# Patient Record
Sex: Female | Born: 1950
Health system: Southern US, Community
[De-identification: ages and names within clinical notes are randomized; demographics above are authoritative.]

## PROBLEM LIST (undated history)

## (undated) DIAGNOSIS — K760 Fatty (change of) liver, not elsewhere classified: Secondary | ICD-10-CM

## (undated) DIAGNOSIS — G4730 Sleep apnea, unspecified: Secondary | ICD-10-CM

## (undated) DIAGNOSIS — H269 Unspecified cataract: Secondary | ICD-10-CM

## (undated) DIAGNOSIS — F32A Depression, unspecified: Secondary | ICD-10-CM

## (undated) DIAGNOSIS — C801 Malignant (primary) neoplasm, unspecified: Secondary | ICD-10-CM

## (undated) DIAGNOSIS — S82899A Other fracture of unspecified lower leg, initial encounter for closed fracture: Secondary | ICD-10-CM

## (undated) DIAGNOSIS — I4891 Unspecified atrial fibrillation: Secondary | ICD-10-CM

## (undated) DIAGNOSIS — F99 Mental disorder, not otherwise specified: Secondary | ICD-10-CM

## (undated) DIAGNOSIS — R112 Nausea with vomiting, unspecified: Secondary | ICD-10-CM

## (undated) DIAGNOSIS — R0600 Dyspnea, unspecified: Secondary | ICD-10-CM

## (undated) DIAGNOSIS — Z9889 Other specified postprocedural states: Secondary | ICD-10-CM

## (undated) DIAGNOSIS — K589 Irritable bowel syndrome without diarrhea: Secondary | ICD-10-CM

## (undated) DIAGNOSIS — Z973 Presence of spectacles and contact lenses: Secondary | ICD-10-CM

## (undated) DIAGNOSIS — M797 Fibromyalgia: Secondary | ICD-10-CM

## (undated) DIAGNOSIS — F419 Anxiety disorder, unspecified: Secondary | ICD-10-CM

## (undated) DIAGNOSIS — G2401 Drug induced subacute dyskinesia: Secondary | ICD-10-CM

## (undated) DIAGNOSIS — F329 Major depressive disorder, single episode, unspecified: Secondary | ICD-10-CM

## (undated) DIAGNOSIS — F319 Bipolar disorder, unspecified: Secondary | ICD-10-CM

## (undated) DIAGNOSIS — K219 Gastro-esophageal reflux disease without esophagitis: Secondary | ICD-10-CM

## (undated) HISTORY — PX: ABDOMINAL HYSTERECTOMY: SHX81

## (undated) HISTORY — DX: Irritable bowel syndrome, unspecified: K58.9

## (undated) HISTORY — PX: TONSILLECTOMY: SUR1361

## (undated) HISTORY — PX: COLONOSCOPY: SHX174

## (undated) HISTORY — DX: Unspecified atrial fibrillation: I48.91

## (undated) HISTORY — DX: Fibromyalgia: M79.7

## (undated) HISTORY — DX: Fatty (change of) liver, not elsewhere classified: K76.0

## (undated) NOTE — *Deleted (*Deleted)
[facility]  [phone] [phone]     ID: Doris Smith DOB: 1950-07-17  MR#: [id number]  NFA#:[id number]  Patient Care Team: [name], FNP as PCP - General (Nurse Practitioner) Jodelle Gross, NP as PCP - Cardiology (Nurse Practitioner) Ovidio Kin, MD as Consulting Physician (General Surgery) Magrinat, Valentino Hue, MD as Consulting Physician (Oncology) Dorothy Puffer, MD as Consulting Physician (Radiation Oncology) Lysle Pearl, MD as Consulting Physician (Pulmonary Disease) Beverely Low, MD as Consulting Physician (Orthopedic Surgery) Jene Every, MD as Consulting Physician (Orthopedic Surgery) Oneta Rack, NP as Nurse Practitioner (Adult Health Nurse Practitioner) Pershing Proud, RN as Oncology Nurse Navigator Donnelly Angelica, RN as Oncology Nurse Navigator Glenna Fellows, MD as Consulting Physician (Plastic Surgery) Rollene Rotunda, MD as Consulting Physician (Cardiology) Kari Baars OTHER MD: Donnie Aho, psychiatry   CHIEF COMPLAINT: Estrogen receptor positive breast cancer (s/p right mastectomy)  CURRENT TREATMENT: anastrozole; ibandronate   INTERVAL HISTORY: Doris Smith returns today for follow-up of her estrogen receptor positive breast cancer.   She continues on anastrozole.  She does not have symptoms specifically to this medication although she generally does not feel well.  Hot flashes and vaginal dryness are not major concerns  She also continues on ibandronate.  She is using the appropriate precautions taking the medication, has had no reflux or other side effects from it  Doris Smith's last bone density screening on 10/13/2016, showed a T-score of -2.1, which is considered osteopenic.  Since her last visit here, she has not undergone any additional studies. She is behind on her annual mammography (uni left), which was due in 08/2019 from [facility].    REVIEW OF SYSTEMS: Doris Smith     COVID 19 VACCINATION STATUS:    HISTORY OF CURRENT ILLNESS: From the original intake note:  "Doris Smith" presented to her PCP, Dr. Daphine Deutscher, with right breast pain, fullness, and nipple inversion since November 2019. She proceeded to undergo bilateral diagnostic mammography with tomography and right breast ultrasonography at [facility] on 07/31/2018 showing: findings compatible with multicentric breast cancer spanning at least the upper inner and upper outer quadrants of the right breast; normal right axilla. Palpable firmness of approximately 1.5 cm was noted in the 9 o'clock position, which was also seen on ultrasound with indisctinct margins measuring 2.7 x 2.3 x 2.2 cm. At 1 o'clock, a mass with poorly defined margins measures 1.9 x 1 x 0.8 cm. Additional poorly defined masses were seen in the 12 o'clock retroareolar region.  Accordingly on 08/02/2018 she proceeded to biopsy of the right breast area in question. The pathology ([id number]) from this procedure showed: invasive mammary carcinoma at 9 o'clock and 1 o'clock; e-cadherin negative, consistent with lobular phenotype; grade 2-3. Prognostic indicators significant for: estrogen receptor, 90% positive and progesterone receptor, 1% positive, both with strong staining intensity. Proliferation marker Ki67 at 1%. HER2 equivocal by immunohistochemistry, 2+ but negative by fluorescent in situ hybridization with a signals ratio 1.04 and number per cell 1.2.   The patient's subsequent history is as detailed below.   PAST MEDICAL HISTORY: Past Medical History:  Diagnosis Date  . A-fib (HCC) 11/2012   PAF  . Anxiety    takes Ativan  . Asthma 04/07/2011   dx  . Bipolar disorder (HCC)   . Cancer [facility])    Right breast  . Depression   . Early cataracts, bilateral   . Fatty liver   . Fibromyalgia   . GERD (gastroesophageal reflux disease)   .  Irritable bowel syndrome   . Mental disorder    dx bipolar  . PONV (postoperative nausea and  vomiting)   . Sleep apnea 04/2011    does not wear CPAP  She has chronic sinus headaches, hiatal hernia   PAST SURGICAL HISTORY: Past Surgical History:  Procedure Laterality Date  . ABDOMINAL HYSTERECTOMY    . COLONOSCOPY    . DILATION AND CURETTAGE OF UTERUS  1981   abnormal pap  . KNEE ARTHROSCOPY Left 2007   x 2  . LUMBAR LAMINECTOMY/DECOMPRESSION MICRODISCECTOMY  05/31/2011   Procedure: LUMBAR LAMINECTOMY/DECOMPRESSION MICRODISCECTOMY;  Surgeon: Javier Docker;  Location: [facility];  Service: Orthopedics;  Laterality: Left;  Decompression Lumbar four to five and  Lumbar five to Sacral one on Left  (X-Ray)  . MASTECTOMY W/ SENTINEL NODE BIOPSY Right 09/16/2018   Procedure: RIGHT MASTECTOMY WITH RIGHT AXILLARY SENTINEL LYMPH NODE BIOPSY;  Surgeon: Ovidio Kin, MD;  Location: [facility] OR;  Service: General;  Laterality: Right;  . PARTIAL HYSTERECTOMY  1982  . PORTACATH PLACEMENT Left 10/31/2018   Procedure: INSERTION PORT-A-CATH WITH ULTRASOUND;  Surgeon: Ovidio Kin, MD;  Location: [facility];  Service: General;  Laterality: Left;  . TONSILLECTOMY     as child  . TOTAL KNEE ARTHROPLASTY Left 12/18/2005    FAMILY HISTORY Family History  Problem Relation Age of Onset  . Anesthesia problems Mother   . Heart disease Mother        CHF, atrial fib  . Heart failure Mother   . Anesthesia problems Sister   . Colon polyps Father   . Heart disease Father        "Fluid around the heart"  . Hypertension Sister   . Breast cancer Maternal Aunt   . Colon cancer Maternal Aunt   . Prostate cancer Neg Hx   . Ovarian cancer Neg Hx    As of February 2019, patient father is alive at 56 years old. Patient mother is alive at 28 years old. She notes a family hx of breast cancer. A maternal aunt was diagnosed with breast cancer, but Doris Smith is unsure if it was in one or both breasts. She has 3 siblings, 3 sisters and 0 brothers.   GYNECOLOGIC HISTORY:  No LMP recorded. Patient is  postmenopausal. Menarche: 32 years old Age at first live birth: 79 years old GX P 2 LMP about 42 years ago Contraceptive no HRT no  Hysterectomy? partial So? no   SOCIAL HISTORY: Doris Smith worked in [employer] for 15 years. She is on disability because her back, knees, and nerves. Her husband, [name], has been at [employer] 43 years as a Estate agent.  Even though their home is in [place] they are actually living in [place] in an apartment while her husband works there.  Son [name] Needle, age 31, works as a Surveyor, minerals in [place], [place]. Son Onalee Hua, age 49, works as a Psychologist, occupational in [place], [place]. They have 4 grandchildren, 2 great-grandchildren with 2 more on the way. She attends a [employer].   ADVANCED DIRECTIVES: In the absence of any documents to the contrary the patient's husband is her healthcare power of attorney   HEALTH MAINTENANCE: Social History   Tobacco Use  . Smoking status: Never Smoker  . Smokeless tobacco: Never Used  Vaping Use  . Vaping Use: Never used  Substance Use Topics  . Alcohol use: No  . Drug use: No     Colonoscopy: 2019  PAP: 11/03/2014, normal  Bone  density: 10/13/2016, T-score of -2.1   Allergies  Allergen Reactions  . Iohexol     "over 20 years ago" had reaction "hurting in arm and heart"-Done in [place], [place].Marland Kitchenphysician stopped CT at that time.  . Codeine Other (See Comments)    Makes feel like she is wild   . Palonosetron Other (See Comments)    headache  . Prednisone Other (See Comments)    Makes me very irritable  . Sulfonamide Derivatives Other (See Comments)    Upset stomach  . Celecoxib Nausea And Vomiting  . Sulfa Antibiotics Nausea And Vomiting  . Vitamin B12 Rash    Current Outpatient Medications  Medication Sig Dispense Refill  . anastrozole (ARIMIDEX) 1 MG tablet Take 1 tablet (1 mg total) by mouth daily. 30 tablet 3  . apixaban (ELIQUIS) 5 MG TABS tablet Take 1 tablet (5 mg total) by mouth 2 (two) times daily. 180 tablet 3  .  benzonatate (TESSALON PERLES) 100 MG capsule Take 1 capsule (100 mg total) by mouth 3 (three) times daily as needed for cough. 20 capsule 0  . clotrimazole-betamethasone (LOTRISONE) cream APPLY  CREAM TOPICALLY TWICE DAILY 45 g 0  . esomeprazole (NEXIUM) 40 MG capsule Take 1 capsule (40 mg total) by mouth daily. 90 capsule 1  . gabapentin (NEURONTIN) 300 MG capsule Take 1 capsule (300 mg total) by mouth 3 (three) times daily. 180 capsule 1  . ibandronate (BONIVA) 150 MG tablet Take 1 tablet (150 mg total) by mouth every 30 (thirty) days. Take in the morning with a full glass of water, on an empty stomach, and do not take anything else by mouth or lie down for the next 30 min. 3 tablet 4  . lamoTRIgine (LAMICTAL) 100 MG tablet Take 1 tablet (100 mg total) by mouth in the morning, at noon, and at bedtime. 180 tablet 1  . loratadine (CLARITIN) 10 MG tablet Take 10 mg by mouth daily.    Marland Kitchen LORazepam (ATIVAN) 0.5 MG tablet Take 1 tablet (0.5 mg total) by mouth at bedtime as needed (Nausea or vomiting). 30 tablet 0  . metoprolol tartrate (LOPRESSOR) 25 MG tablet Take 1 tablet (25 mg total) by mouth 2 (two) times daily. 180 tablet 3  . midodrine (PROAMATINE) 2.5 MG tablet Take 1 tablet (2.5 mg total) by mouth 3 (three) times daily with meals. 270 tablet 3  . promethazine-dextromethorphan (PROMETHAZINE-DM) 6.25-15 MG/5ML syrup TAKE 5 MILLILITERS BY MOUTH 4 TIMES DAILY AS NEEDED FOR COUGH    . Vilazodone HCl (VIIBRYD) 40 MG TABS Take 1 tablet (40 mg total) by mouth daily. 90 tablet 1   No current facility-administered medications for this visit.   Facility-Administered Medications Ordered in Other Visits  Medication Dose Route Frequency Provider Last Rate Last Admin  . heparin lock flush 100 unit/mL  500 Units Intravenous Once Magrinat, Valentino Hue, MD      . sodium chloride flush (NS) 0.9 % injection 10 mL  10 mL Intravenous PRN Magrinat, Valentino Hue, MD        OBJECTIVE: white woman using a cane  There were  no vitals filed for this visit. Wt Readings from Last 3 Encounters:  05/13/20 226 lb (102.5 kg)  04/27/20 226 lb 9.6 oz (102.8 kg)  04/01/20 228 lb (103.4 kg)   There is no height or weight on file to calculate BMI.    ECOG FS:2 - Symptomatic, <50% confined to bed  Sclerae unicteric, EOMs intact Wearing a mask No cervical or supraclavicular  adenopathy Lungs no rales or rhonchi Heart regular rate and rhythm Abd soft, nontender, positive bowel sounds MSK no focal spinal tenderness, no upper extremity lymphedema Neuro: nonfocal, well oriented, appropriate affect Breasts:    {Ocular: Sclerae unicteric, pupils round and equal Ear-nose-throat: Wearing a mask Lymphatic: No cervical or supraclavicular adenopathy Lungs no rales or rhonchi Heart regular rate and rhythm Abd soft, nontender, positive bowel sounds MSK no focal spinal tenderness, no joint edema Neuro: non-focal, well-oriented, appropriate affect Breasts: Status post right mastectomy with no evidence of chest wall recurrence.  The left breast is benign.  Both axillae are benign.}  LAB RESULTS:  CMP     Component Value Date/Time   NA 140 02/03/2020 1049   K 4.4 02/03/2020 1049   CL 103 02/03/2020 1049   CO2 27 02/03/2020 1049   GLUCOSE 82 02/03/2020 1049   GLUCOSE 93 01/20/2020 1112   BUN 9 02/03/2020 1049   CREATININE 0.86 02/03/2020 1049   CREATININE 0.90 12/19/2018 1434   CALCIUM 9.0 02/03/2020 1049   PROT 6.4 02/03/2020 1049   ALBUMIN 4.1 02/03/2020 1049   AST 15 02/03/2020 1049   AST 14 (L) 08/14/2018 0758   ALT 11 02/03/2020 1049   ALT 10 08/14/2018 0758   ALKPHOS 112 02/03/2020 1049   BILITOT 0.4 02/03/2020 1049   BILITOT 0.5 08/14/2018 0758   GFRNONAA 71 02/03/2020 1049   GFRNONAA >60 12/19/2018 1434   GFRAA 81 02/03/2020 1049   GFRAA >60 12/19/2018 1434    No results found for: TOTALPROTELP, ALBUMINELP, A1GS, A2GS, BETS, BETA2SER, GAMS, MSPIKE, SPEI  No results found for: KPAFRELGTCHN,  LAMBDASER, KAPLAMBRATIO  Lab Results  Component Value Date   WBC 5.5 02/03/2020   NEUTROABS 3.8 02/03/2020   HGB 11.9 02/03/2020   HCT 34.9 02/03/2020   MCV 84 02/03/2020   PLT 262 02/03/2020   No results found for: LABCA2  No components found for: [id number]  No results for input(s): INR in the last 168 hours.  No results found for: LABCA2  No results found for: WGN562  No results found for: ZHY865  No results found for: HQI696  No results found for: CA2729  No components found for: HGQUANT  No results found for: CEA1 / No results found for: CEA1   No results found for: AFPTUMOR  No results found for: CHROMOGRNA  No results found for: HGBA, HGBA2QUANT, HGBFQUANT, HGBSQUAN (Hemoglobinopathy evaluation)   No results found for: LDH  No results found for: IRON, TIBC, IRONPCTSAT (Iron and TIBC)  No results found for: FERRITIN  Urinalysis    Component Value Date/Time   COLORURINE YELLOW 12/19/2018 1355   APPEARANCEUR Clear 02/03/2020 1045   LABSPEC 1.016 12/19/2018 1355   PHURINE 5.0 12/19/2018 1355   GLUCOSEU Negative 02/03/2020 1045   HGBUR NEGATIVE 12/19/2018 1355   BILIRUBINUR Negative 02/03/2020 1045   KETONESUR NEGATIVE 12/19/2018 1355   PROTEINUR Negative 02/03/2020 1045   PROTEINUR NEGATIVE 12/19/2018 1355   UROBILINOGEN negative 11/03/2014 1039   UROBILINOGEN 0.2 05/25/2011 0923   NITRITE Negative 02/03/2020 1045   NITRITE NEGATIVE 12/19/2018 1355   LEUKOCYTESUR Negative 02/03/2020 1045   LEUKOCYTESUR SMALL (A) 12/19/2018 1355    STUDIES: No results found.   ELIGIBLE FOR AVAILABLE RESEARCH PROTOCOL: no   ASSESSMENT: 58 y.o. Doris Smith, [place] woman status post right breast biopsy 08/02/2018 for a clinical T3 N0, stage IIA invasive lobular carcinoma, grade 2, estrogen receptor strongly positive, progesterone receptor 1% positive, with no HER-2 amplification and an MIB-1 of  1%.  (a) CT scan of the head and chest, without contrast 08/23/2018 showed  nonspecific 0.4 cm left lower lobe pulmonary nodule, no definitive metastatic disease  (b) bone scan 08/23/2018 shows multiple spinal areas of abnormal uptake, but  (c) total spinal MRI 09/03/2018 finds bone scan findings to be secondary to degenerative disease, no evidence of metastatic disease.  (1) status post right mastectomy and sentinel lymph node sampling 09/16/2018 showing a pT3 pN1(mic), stage IIA-IIIA invasive lobular breast cancer, grade 2, with 1 of 5 sampled lymph nodes involved by micrometastatic deposit, with extra nodular extension; ample margins  (2)  MammaPrint "high risk" suggests a 5-year metastasis free survival of 93% with chemotherapy, with an absolute chemotherapy benefit in the greater than 12% range  (3) adjuvant radiation 12/31/2018-02/19/2019:  The right chest wall and regional nodes were treated to 50.4 Gy in 28 fractions followed by a 10 Gy boost over 5 fractions.   (4) anastrozole started neoadjuvantly (on 08/14/2018), discontinued 10/14/2018 in preparation for chemotherapy, resumed October 2020  (a) bone density 10/13/2016 showed osteopenia with a lowest score at -2.1  (b) bone density JAN 2021  (c) ibandronate/Boniva started January 2021  (5) Caris testing on mastectomy sample (09/16/2018) showed stable MSI and proficient mismatch repair status, with a low mutational burden; BRCA 1 and 2 were not mutated, PI K3 was not mutated, ER B B2 was not mutated, and a KT 1 was not mutated.  The androgen receptor was positive (90% at 2+) and there was a pathogenic PTEN variant an excellent 3 (c.209+1G>A)  (6) adjuvant chemotherapy consisting of cyclophosphamide, methotrexate, fluorouracil (CMF) started 11/05/2018, repeated every 21 days x 8, last dose 03/02/2019  (a) methotrexate was omitted during concurrent radiation (cycles 4, 5 and 6)  (7) continuing on anastrozole and ibandronate   PLAN: Doris Smith is about a year and a half out from definitive surgery for her breast  cancer with no evidence of disease recurrence.  This is favorable.  She is tolerating the anastrozole and Boniva well and the plan is to continue both for a total of 5 years.  I discussed the importance of her complying with the cardiac medications she has been started on.  She is going to be discussing this further with Dr. Antoine Poche at their next visit  She is behind on mammography and I have put in the order for a left screening mammogram.  She will see me again in about 6 months.  She knows to call for any other issues that may develop before then  Total encounter time 25 minutes.Raymond Gurney C. Magrinat, MD  05/16/20 3:27 PM Medical Oncology and Hematology [facility] [address] [place], [place] [address] Tel. [phone]    Fax. [phone]   I, Mickie Bail, am acting as scribe for Dr. Valentino Hue. Magrinat.  {Add scribe attestation statement}   *Total Encounter Time as defined by the Centers for Medicare and Medicaid Services includes, in addition to the face-to-face time of a patient visit (documented in the note above) non-face-to-face time: obtaining and reviewing outside history, ordering and reviewing medications, tests or procedures, care coordination (communications with other health care professionals or caregivers) and documentation in the medical record.

---

## 1979-07-11 HISTORY — PX: DILATION AND CURETTAGE OF UTERUS: SHX78

## 1980-07-10 HISTORY — PX: PARTIAL HYSTERECTOMY: SHX80

## 1999-08-25 ENCOUNTER — Ambulatory Visit (HOSPITAL_COMMUNITY): Admission: RE | Admit: 1999-08-25 | Discharge: 1999-08-25 | Payer: Self-pay | Admitting: Family Medicine

## 1999-08-25 ENCOUNTER — Encounter: Payer: Self-pay | Admitting: Family Medicine

## 1999-09-20 ENCOUNTER — Encounter: Admission: RE | Admit: 1999-09-20 | Discharge: 1999-12-19 | Payer: Self-pay | Admitting: Anesthesiology

## 2000-04-24 ENCOUNTER — Other Ambulatory Visit: Admission: RE | Admit: 2000-04-24 | Discharge: 2000-04-24 | Payer: Self-pay | Admitting: Family Medicine

## 2002-06-24 ENCOUNTER — Other Ambulatory Visit: Admission: RE | Admit: 2002-06-24 | Discharge: 2002-06-24 | Payer: Self-pay | Admitting: Family Medicine

## 2003-06-17 ENCOUNTER — Inpatient Hospital Stay (HOSPITAL_COMMUNITY): Admission: EM | Admit: 2003-06-17 | Discharge: 2003-06-24 | Payer: Self-pay | Admitting: Psychiatry

## 2005-04-06 ENCOUNTER — Other Ambulatory Visit: Admission: RE | Admit: 2005-04-06 | Discharge: 2005-04-06 | Payer: Self-pay | Admitting: Family Medicine

## 2005-07-10 HISTORY — PX: KNEE ARTHROSCOPY: SUR90

## 2005-12-18 ENCOUNTER — Inpatient Hospital Stay (HOSPITAL_COMMUNITY): Admission: RE | Admit: 2005-12-18 | Discharge: 2005-12-22 | Payer: Self-pay | Admitting: Orthopedic Surgery

## 2005-12-18 HISTORY — PX: TOTAL KNEE ARTHROPLASTY: SHX125

## 2006-05-04 ENCOUNTER — Encounter: Admission: RE | Admit: 2006-05-04 | Discharge: 2006-05-04 | Payer: Self-pay | Admitting: Family Medicine

## 2006-11-27 ENCOUNTER — Other Ambulatory Visit: Admission: RE | Admit: 2006-11-27 | Discharge: 2006-11-27 | Payer: Self-pay | Admitting: Family Medicine

## 2006-12-21 ENCOUNTER — Ambulatory Visit: Payer: Self-pay | Admitting: Cardiology

## 2007-01-07 ENCOUNTER — Ambulatory Visit: Payer: Self-pay

## 2007-01-07 LAB — CONVERTED CEMR LAB
BUN: 8 mg/dL (ref 6–23)
CO2: 30 meq/L (ref 19–32)
Calcium: 8.7 mg/dL (ref 8.4–10.5)
Chloride: 110 meq/L (ref 96–112)
Creatinine, Ser: 0.6 mg/dL (ref 0.4–1.2)
GFR calc Af Amer: 134 mL/min
GFR calc non Af Amer: 111 mL/min
Glucose, Bld: 104 mg/dL — ABNORMAL HIGH (ref 70–99)
Potassium: 3.9 meq/L (ref 3.5–5.1)
Sodium: 146 meq/L — ABNORMAL HIGH (ref 135–145)

## 2007-03-21 ENCOUNTER — Ambulatory Visit: Payer: Self-pay | Admitting: Gastroenterology

## 2007-09-27 DIAGNOSIS — M129 Arthropathy, unspecified: Secondary | ICD-10-CM | POA: Insufficient documentation

## 2007-09-27 DIAGNOSIS — J309 Allergic rhinitis, unspecified: Secondary | ICD-10-CM | POA: Insufficient documentation

## 2007-09-27 DIAGNOSIS — I10 Essential (primary) hypertension: Secondary | ICD-10-CM | POA: Insufficient documentation

## 2007-09-27 DIAGNOSIS — M51379 Other intervertebral disc degeneration, lumbosacral region without mention of lumbar back pain or lower extremity pain: Secondary | ICD-10-CM | POA: Insufficient documentation

## 2007-09-27 DIAGNOSIS — F319 Bipolar disorder, unspecified: Secondary | ICD-10-CM | POA: Insufficient documentation

## 2007-09-27 DIAGNOSIS — M5137 Other intervertebral disc degeneration, lumbosacral region: Secondary | ICD-10-CM | POA: Insufficient documentation

## 2007-09-27 DIAGNOSIS — K589 Irritable bowel syndrome without diarrhea: Secondary | ICD-10-CM | POA: Insufficient documentation

## 2008-04-14 ENCOUNTER — Encounter: Payer: Self-pay | Admitting: Gastroenterology

## 2008-04-14 ENCOUNTER — Encounter: Payer: Self-pay | Admitting: Internal Medicine

## 2008-04-17 ENCOUNTER — Encounter (INDEPENDENT_AMBULATORY_CARE_PROVIDER_SITE_OTHER): Payer: Self-pay | Admitting: *Deleted

## 2008-05-13 ENCOUNTER — Telehealth: Payer: Self-pay | Admitting: Gastroenterology

## 2008-05-18 ENCOUNTER — Ambulatory Visit: Payer: Self-pay | Admitting: Internal Medicine

## 2008-05-19 LAB — CONVERTED CEMR LAB
ALT: 13 units/L (ref 0–35)
AST: 20 units/L (ref 0–37)
Albumin: 3.7 g/dL (ref 3.5–5.2)
Alkaline Phosphatase: 93 units/L (ref 39–117)
Bilirubin, Direct: 0.1 mg/dL (ref 0.0–0.3)
Total Bilirubin: 0.6 mg/dL (ref 0.3–1.2)
Total Protein: 6.7 g/dL (ref 6.0–8.3)

## 2008-05-22 ENCOUNTER — Encounter (INDEPENDENT_AMBULATORY_CARE_PROVIDER_SITE_OTHER): Payer: Self-pay | Admitting: *Deleted

## 2008-06-17 ENCOUNTER — Ambulatory Visit: Payer: Self-pay | Admitting: Internal Medicine

## 2008-06-17 ENCOUNTER — Encounter: Payer: Self-pay | Admitting: Internal Medicine

## 2008-06-17 DIAGNOSIS — R112 Nausea with vomiting, unspecified: Secondary | ICD-10-CM | POA: Insufficient documentation

## 2008-06-19 ENCOUNTER — Encounter: Payer: Self-pay | Admitting: Internal Medicine

## 2008-06-25 ENCOUNTER — Ambulatory Visit (HOSPITAL_COMMUNITY): Admission: RE | Admit: 2008-06-25 | Discharge: 2008-06-25 | Payer: Self-pay | Admitting: Internal Medicine

## 2010-08-09 NOTE — Miscellaneous (Signed)
Summary: dicyclomine  Clinical Lists Changes  Medications: Added new medication of DICYCLOMINE HCL 20 MG  TABS (DICYCLOMINE HCL) three times a day ac - Signed Rx of DICYCLOMINE HCL 20 MG  TABS (DICYCLOMINE HCL) three times a day ac;  #90 x 3;  Signed;  Entered by: Laverna Peace RN;  Authorized by: Hart Carwin MD;  Method used: Electronically to [facility] [phone]*, [address], [place], [place], [place]  [address], Ph: [phone] or [phone], Fax: [phone]    Prescriptions: DICYCLOMINE HCL 20 MG  TABS (DICYCLOMINE HCL) three times a day ac  #90 x 3   Entered by:   Laverna Peace RN   Authorized by:   Hart Carwin MD   Signed by:   Laverna Peace RN on 06/17/2008   Method used:   Electronically to        [facility] [phone]* (retail)       [address]       [place], [place]  [address]       Ph: [phone] or [phone]       Fax: [phone]   RxID:   [id number]

## 2010-08-09 NOTE — Letter (Signed)
Summary: Results Letter  [facility]  [address] [place], [place] [address]   Phone: [phone]  Fax: [phone]        May 22, 2008 MRN: [id number]    [facility] [address] [place], [place]  [address]    Dear Ms. Droessler,   I have been trying to reach you wth some lab test results.  Could you please call me at [phone].         Sincerely,  Paulene Floor, RN  This letter has been electronically signed by your physician.

## 2010-08-09 NOTE — Procedures (Signed)
Summary: Colonoscopy   Colonoscopy  Procedure date:  06/17/2008  Findings:      Location:  [facility].    Procedures Next Due Date:    Colonoscopy: 06/2018  COLONOSCOPY PROCEDURE REPORT  PATIENT:  Doris Smith, Doris Smith  MR#:  [id number] BIRTHDATE:   Mar 30, 1951   GENDER:   female  ENDOSCOPIST:   Hedwig Morton. Juanda Chance, MD Referred by: Rudi Heap, M.D.  PROCEDURE DATE:  06/17/2008 PROCEDURE:  Colonoscopy with biopsy ASA CLASS:   Class I INDICATIONS: 1) change in bowel habits  2) unexplained diarrhea  3) family Hx of polyps   MEDICATIONS:   None  DESCRIPTION OF PROCEDURE:   After the risks benefits and alternatives of the procedure were thoroughly explained, informed consent was obtained.  No rectal exam performed. The LB [id number] endoscope was introduced through the anus and advanced to the cecum, which was identified by both the appendix and ileocecal valve, without limitations.  The quality of the prep was good.  The instrument was then slowly withdrawn as the colon was fully examined. <<PROCEDUREIMAGES>>    <<OLD IMAGES>>  FINDINGS:  A normal appearing cecum, ileocecal valve, and appendiceal orifice were identified. The ascending, transverse, descending, sigmoid colon, and rectum appeared unremarkable. Random biopsies were obtained and sent to pathology.  normal cecum (see image1 and image2).  normal rectum (see image3).   Retroflexed views in the rectum revealed no abnormalities.    The time to cecum =  7.19  minutes. The scope was then withdrawn (time =  5.37  min) from the patient and the procedure completed.  COMPLICATIONS:   None  ENDOSCOPIC IMPRESSION:  1) Normal colon  2) Normal cecum  3) Normal rectum  s/p random colon biopsies to r/o microscopic colitis RECOMMENDATIONS:  1) Dicyclomine 20 mg po tid ac  2) Metamucil  1 tablesp qd  REPEAT EXAM:   In 10 year(s) for Colonoscopy.   _______________________________ Hedwig Morton. Juanda Chance, MD    CC:  Rudi Heap MD   REPORT OF SURGICAL PATHOLOGY   Case #: [id number] Patient Name: Doris Smith, Doris Smith Office Chart Number:  [id number]   MRN: [id number] Pathologist: H. Hollice Espy, MD DOB/Age  [birth year] (Age: 60)    Gender: F Date Taken:  06/17/2008 Date Received: 06/18/2008   FINAL DIAGNOSIS   ***MICROSCOPIC EXAMINATION AND DIAGNOSIS***   1. STOMACH, ANTRUM, BIOPSY:  GASTRIC ANTRAL MUCOSA WITH ASSOCIATED MILD CHRONIC INFLAMMATION, NO EVIDENCE OF HELICOBACTER PYLORI, INTESTINAL METAPLASIA, DYSPLASIA, OR MALIGNANCY IDENTIFIED.   2.  COLON, RANDOM BIOPSY:  BENIGN COLONIC MUCOSA.  NO SIGNIFICANT INFLAMMATION OR OTHER ABNORMALITIES IDENTIFIED.    COMMENT 1.  A Warthin-Starry stain is performed to determine the possibility of the presence of Helicobacter pylori. The Warthin-Starry stain is negative for organisms of Helicobacter pylori. The control(s) stained appropriately.   2.  There is colorectal mucosa with normal crypt architecture and no objective increase in inflammation.  No active inflammation, microscopic colitis, collagenous colitis or significant chronic changes identified. No hyperplastic or adenomatous changes are seen, and there is no evidence of malignancy.   gdt Date Reported:  06/19/2008     H. Hollice Espy, MD *** Electronically Signed Out By [facility] ***  June 19, 2008 MRN: [id number]    [facility] [address] [place], [place]  [address]    Dear Ms. Devoss,    The biopsies taken from Your stomach show mild inflammation. The biopsies from Your colon show normal tissue. Please continue taking Your  medication.  Sincerely,  Hart Carwin MD  This letter has been electronically signed by your physician.   This report was created from the original endoscopy report, which was reviewed and signed by the above listed endoscopist.

## 2010-08-09 NOTE — Miscellaneous (Signed)
Summary: Orders Update  Clinical Lists Changes  Problems: Added new problem of NAUSEA AND VOMITING (ICD-787.01) Orders: Added new Referral order of HIDA CCK (HIDA CCK) - Signed

## 2010-08-09 NOTE — Assessment & Plan Note (Signed)
Summary: WORSENING GERD   (O.K. TO SWITCH TO DR.Claretta Kendra)   Doris Smith    History of Present Illness Visit Type: consult Primary GI MD: Lina Sar MD Primary Provider: Rudi Heap MD Requesting Provider: Rudi Heap MD Chief Complaint: worsening GERD  History of Present Illness:   This is a 60 year old white female with symptoms of lower chest discomfort, heartburn, indigestion, pain with eating and most recently, vomiting with meals. In the last 3 weeks, she has vomited multiple times with complete relief of her symptoms. She has gained about 40 pounds since her left knee replacement 2 years ago. An upper abdominal ultrasound recently showed fatty liver but a normal-appearing gallbladder, normal gallbladder wall and common bile duct. Patient takes Aleve gelcaps 1-2 a day alternating with Tylenol. She has been diagnosed with bipolar disorder for which she takes Pristiq 50 mg a day. Patient voices multiple musculoskeletal complaints pertaining to her back, knees and joints. She has a positive family history of gallbladder disease in her mother. Omeprazole 40 mg a day seems to improve her symptoms.   GI Review of Systems    Reports abdominal pain, acid reflux, belching, bloating, chest pain, dysphagia with solids, heartburn, loss of appetite, vomiting, and  vomiting blood.     Location of  Abdominal pain: epigastric area.    Denies dysphagia with liquids, nausea, weight loss, and  weight gain.      Reports irritable bowel syndrome.     Denies anal fissure, black tarry stools, change in bowel habit, constipation, diarrhea, diverticulosis, fecal incontinence, heme positive stool, hemorrhoids, jaundice, light color stool, liver problems, rectal bleeding, and  rectal pain.     Prior Medications Reviewed Using: List Brought by Patient  Updated Prior Medication List: LORAZEPAM 0.5 MG TABS (LORAZEPAM) 1 by mouth once daily as needed PRISTIQ 50 MG XR24H-TAB (DESVENLAFAXINE SUCCINATE) 1 by mouth once  daily ALLEGRA-D 24 HOUR 180-240 MG XR24H-TAB (FEXOFENADINE-PSEUDOEPHEDRINE) 1 by mouth once daily OMEPRAZOLE 40 MG CPDR (OMEPRAZOLE) 1 by mouth once daily ALEVE 220 MG CAPS (NAPROXEN SODIUM) once daily as needed  Current Allergies (reviewed today): ! SULFA ! CODEINE  Past Medical History:    Reviewed history from 09/27/2007 and no changes required:       Current Problems:        FAMILY HISTORY COLONIC POLYPS (ICD-V18.51)       DEGENERATIVE DISC DISEASE, LUMBOSACRAL SPINE (ICD-722.52)       BIPOLAR DISORDER UNSPECIFIED (ICD-296.80)       ALLERGIC RHINITIS (ICD-477.9)       ARTHRITIS (ICD-716.90)       HYPERTENSION (ICD-401.9)       IRRITABLE BOWEL SYNDROME (ICD-564.1)         Past Surgical History:    Reviewed history from 09/27/2007 and no changes required:       Total Left Knee Arthroplasty       Total Abdominal Hysterectomy   Family History:    Reviewed history from 05/14/2008 and no changes required:       Family History of Colon Polyps: Father       Family History of Heart Disease: Father  Social History:    Reviewed history from 05/14/2008 and no changes required:       Occupation: Housewife       Alcohol Use - no       Illicit Drug Use - no    Review of Systems       The patient complains of cough, sleeping problems, sore throat, and  voice change.     Vital Signs:  Patient Profile:   60 Years Old Female Height:     64 inches Weight:      218.13 pounds BMI:     37.58 Pulse rate:   72 / minute Pulse rhythm:   regular BP sitting:   130 / 66  (left arm)  Vitals Entered By: Chales Abrahams CMA (May 18, 2008 10:33 AM)                  Physical Exam  General:     obese, alert, oriented and very cooperative. Neck:     Supple; no masses or thyromegaly. Lungs:     Clear throughout to auscultation. Heart:     Regular rate and rhythm; no murmurs, rubs,  or bruits. Abdomen:     protuberant and tender in epigastrium and right upper quadrant as well  as in the left upper quadrant.  Livver edge is subcostal margin overall span is normal large. No stagmita of liver disease. No ascites. Rectal:     rectal exam with solid Hemoccult-negative stool. Extremities:     No clubbing, cyanosis, edema or deformities noted.    Impression & Recommendations:  Problem # 1:  IRRITABLE BOWEL SYNDROME (ICD-564.1) upper abdominal pain and dyspepsia suggestive of gastritis. Rule out H. pylori gastropathy. Her H. Pylori antibody was positive. Rule out end-stage related gastropathy due to Aleve . Patient thinks it may be her gallbladder although her ultrasound is normal. I believe her tenderness and right upper quadrant pains may be due to her liver and mild hepatomegaly but this may have to be further investigated. Orders: Colon/Endo (Colon/Endo) TLB-Hepatic/Liver Function Pnl (80076-HEPATIC)   Problem # 2:  FAMILY HISTORY COLONIC POLYPS (ICD-V18.51) no lower GI symptoms, but patient is over 50 and has a family history of colon polyps, so we will proceed with a colonoscopic exam. Orders: Colon/Endo (Colon/Endo)   Problem # 3:  DEGENERATIVE DISC DISEASE, LUMBOSACRAL SPINE (ICD-722.52) multiple joint complaints and muscle pains suggestive of fibromyalgia. Patient is on an antidepressant.  Problem # 4:  HYPERTENSION (ICD-401.9) patient advised to lose weight to control bllood pressure as well as to treat her fatty liver and improve her gastroesophageal reflux which may be related to massive weight gain.   Patient Instructions: 1)  upper endoscopy 2)  Prilosec 40 mg q.a.m. and 20 mg q.h.s. samples given 3)  Colonoscopy 4)  Weight loss 5)  Hepatic function today 6)  Copy Sent To:Dr Vernon Prey    Prescriptions: DULCOLAX 5 MG  TBEC (BISACODYL) Day before procedure take 2 at 3pm and 2 at 8pm.  #4 x 0   Entered by:   Hortense Ramal CMA   Authorized by:   Hart Carwin MD   Signed by:   Hortense Ramal CMA on 05/18/2008   Method used:   Electronically to         [facility] [phone]* (retail)       [address]       [place], [place]  [address]       Ph: [phone] or [phone]       Fax: [phone]   RxID:   [id number] REGLAN 10 MG  TABS (METOCLOPRAMIDE HCL) As per prep instructions.  #2 x 0   Entered by:   Hortense Ramal CMA   Authorized by:   Hart Carwin MD   Signed by:  Hortense Ramal CMA on 05/18/2008   Method used:   [facility]ronically to        [facility] [phone][facility] (retail)       [address]       [place], [place]  [address]       Ph: [phone] or [phone]       Fax: [phone]   RxID:   [id number] MIRALAX   POWD (POLYETHYLENE GLYCOL 3350) As per prep  instructions.  #255gm x 0   Entered by:   Hortense Ramal CMA   Authorized by:   Hart Carwin MD   Signed by:   Hortense Ramal CMA on 05/18/2008   Method used:   Electronically to        [facility] [phone]* (retail)       [address]       [place], [place]  [address]       Ph: [phone] or [phone]       Fax: [phone]   RxID:   [id number]  ]

## 2010-08-09 NOTE — Progress Notes (Signed)
Summary: Switch from Dr Russella Dar to Dr.Brodie    Phone Note From Other Clinic Call back at [phone] pt 's number   Caller: [email or web address]S OFFICE Call For: DR Russella Dar Summary of Call: Did not like Dr Russella Dar and wants to switch to any other GI Dr in our group. Initial call taken by: Leanor Kail [facility],  May 13, 2008 12:11 PM  Follow-up for Phone Call        Doesn't feel she and Dr Russella Dar "clicked"  would like to change.  She will call back with her preference of MD.  Dr Russella Dar do you approve of change? Follow-up by: Darcey Nora RN,  May 13, 2008 1:32 PM  Additional Follow-up for Phone Call Additional follow up Details #1::        OK with me Additional Follow-up by: Meryl Dare MD Clementeen Graham,  May 13, 2008 2:44 PM    Additional Follow-up for Phone Call Additional follow up Details #2::    PT CALLED BACK AND WOULD LIKE TO SEE EITHER DR. KAPLAN OR DR Juanda Chance.  DR.BRODIE--WILL YOU ACCEPT PATIENT? Laureen Ochs LPN  May 14, 2008 8:41 AM    Additional Follow-up for Phone Call Additional follow up Details #3:: Details for Additional Follow-up Action Taken: Yes- DB.  Pt. with worsening GERD. Will see Dr.Brodie on 05-18-08 at 10:45am. Additional Follow-up by: Laureen Ochs LPN,  May 14, 2008 11:26 AM

## 2010-08-09 NOTE — Procedures (Signed)
Summary: EGD   EGD  Procedure date:  06/17/2008  Findings:      Location: [facility]    ENDOSCOPY PROCEDURE REPORT  PATIENT:  Doris Smith, Doris Smith  MR#:  [id number] BIRTHDATE:   [birth year]   GENDER:   female  ENDOSCOPIST:   Hedwig Morton. Juanda Chance, MD Referred by: Rudi Heap, M.D.  PROCEDURE DATE:  06/17/2008 PROCEDURE:  EGD with biopsy ASA CLASS:   Class I INDICATIONS: nausea and vomiting, heartburn   MEDICATIONS:    Versed 5 mg IV, Fentanyl 50 mcg IV, Benadryl 50 mg IV TOPICAL ANESTHETIC:   Exactacain Spray  DESCRIPTION OF PROCEDURE:   After the risks benefits and alternatives of the procedure were thoroughly explained, informed consent was obtained.  The LB GIF-H180 [id number] endoscope was introduced through the mouth and advanced to the second portion of the duodenum, without limitations.  The instrument was slowly withdrawn as the mucosa was fully examined. <<PROCEDUREIMAGES>>    <<OLD IMAGES>>  The stomach was entered and closely examined. The antrum, angularis, and lesser curvature were well visualized, including a retroflexed view of the cardia and fundus. The stomach wall was normally distensable. The scope passed easily through the pylorus into the duodenum. in the antrum. A biopsy for H. pylori was taken (see image2).  The esophagus and gastroesophageal junction were completely normal in appearance.  The duodenal bulb was normal in appearance, as was the postbulbar duodenum (see image1).  The stomach was entered and closely examined. The antrum, angularis, and lesser curvature were well visualized, including a retroflexed view of the cardia and fundus. The stomach wall was normally distensable. The scope passed easily through the pylorus into the duodenum. in the fundus (see image3).    Retroflexion was not performed.  The scope was then withdrawn from the patient and the procedure completed.  COMPLICATIONS:   None  ENDOSCOPIC IMPRESSION:  1) Normal stomach in the  antrum  2) Normal esophagus  3) Normal duodenum  4) Normal stomach in the fundus  s/p antral biopsies to r/o H.Pylori RECOMMENDATIONS:  1) anti-reflux regimen  2) await biopsy results  I think  pt's symptoms are functional, she may benefit from stronger psychotropic agents  REPEAT EXAM:   No   _______________________________ Hedwig Morton. Juanda Chance, MD    CC: Rudi Heap MD   REPORT OF SURGICAL PATHOLOGY   Case #: [id number] Patient Name: Doris Smith Office Chart Number:  [id number]   MRN: [id number] Pathologist: H. Hollice Espy, MD DOB/Age  [birth year] (Age: 60)    Gender: F Date Taken:  06/17/2008 Date Received: 06/18/2008   FINAL DIAGNOSIS   ***MICROSCOPIC EXAMINATION AND DIAGNOSIS***   1. STOMACH, ANTRUM, BIOPSY:  GASTRIC ANTRAL MUCOSA WITH ASSOCIATED MILD CHRONIC INFLAMMATION, NO EVIDENCE OF HELICOBACTER PYLORI, INTESTINAL METAPLASIA, DYSPLASIA, OR MALIGNANCY IDENTIFIED.   2.  COLON, RANDOM BIOPSY:  BENIGN COLONIC MUCOSA.  NO SIGNIFICANT INFLAMMATION OR OTHER ABNORMALITIES IDENTIFIED.    COMMENT 1.  A Warthin-Starry stain is performed to determine the possibility of the presence of Helicobacter pylori. The Warthin-Starry stain is negative for organisms of Helicobacter pylori. The control(s) stained appropriately.   2.  There is colorectal mucosa with normal crypt architecture and no objective increase in inflammation.  No active inflammation, microscopic colitis, collagenous colitis or significant chronic changes identified. No hyperplastic or adenomatous changes are seen, and there is no evidence of malignancy.   gdt Date Reported:  06/19/2008     H. Hollice Espy, MD *** Electronically Signed Out By [facility] ***  June 19, 2008 MRN: [id number]    [facility] [place] [address] [place], [place]  [address]    Dear Doris Smith,    The biopsies taken from Your stomach show mild inflammation. The biopsies from Your colon show normal tissue. Please continue  taking Your  medication.         Sincerely,  Hart Carwin MD  This letter has been electronically signed by your physician.  This report was created from the original endoscopy report, which was reviewed and signed by the above listed endoscopist.

## 2010-08-09 NOTE — Letter (Signed)
Summary: Results Letter  [facility]  [address] [place], [place] [address]   Phone: [phone]  Fax: [phone]        June 19, 2008 MRN: [id number]    [facility] [place] [address] [place], [place]  [address]    Dear Doris Smith,    The biopsies taken from Your stomach show mild inflammation. The biopsies from Your colon show normal tissue. Please continue taking Your  medication.         Sincerely,  Doris Carwin MD  This letter has been electronically signed by your physician.

## 2010-11-22 NOTE — Assessment & Plan Note (Signed)
[facility]                            CARDIOLOGY OFFICE NOTE   NAME:COXJasleen, Riepe                          MRN:          [id number]  DATE:12/21/2006                            DOB:          04/09/1951    REASON FOR PRESENTATION:  Evaluate patient with chest discomfort.   HISTORY OF PRESENT ILLNESS:  Patient is a pleasant 60 year old white  female with no prior cardiac history.  She has noticed chest discomfort  since about March.  She describes a substernal discomfort.  It also  radiates through to her back.  She has had some left arm discomfort.  It  seems to be sharp.  She cannot bring it on with any activity.  It  happens at rest.  It is increasing in frequency and intensity.  It may  be 8 out of 10 in intensity.  It waxes and wanes throughout the day.  She took Mobic without any improvement.  She says it might be worse with  movement.  She has noticed some shortness of breath, but no necessarily  with this discomfort.  She has not any associated nausea, vomiting, or  diaphoresis.  She has not had any jaw discomfort.  She is describing PND  or orthopnea.  She sleeps on 1 pillow.  She has been short of breath  with moderate activity.  She is limited by back and knee pain.   PAST MEDICAL HISTORY:  1. Bipolar disorder.  2. Hypertension.   PAST SURGICAL HISTORY:  1. Knee surgery.  2. Hysterectomy.   ALLERGIES:  1. CODEINE.  2. SULFA.   MEDICATIONS:  1. Wellbutrin 300 mg daily.  2. Celexa 40 mg daily.  3. Trileptal 150 mg b.i.d.  4. Allegra.  5. Aleve.  6. Nexium 40 mg daily.   SOCIAL HISTORY:  The patient is a housewife.  She is married.  She has 2  children.  She does not smoke cigarettes, and does not drink alcohol.   FAMILY HISTORY:  Noncontributory for early coronary artery disease.   REVIEW OF SYSTEMS:  As stated in the HPI.  Positive for headaches,  reflux, joint pains.  Negative for other systems.   PHYSICAL EXAMINATION:   Patient is in no distress.  Blood pressure 108/72.  Heart rate is 70 and regular.  Weight 215  pounds.  HEENT:  Eyelids unremarkable.  Pupils equal, round, and reactive to  light.  Fundi not visualized.  Oral mucosa unremarkable.  NECK:  No jugular venous distention at 45 degrees.  Carotid upstroke  brisk and symmetric.  No bruits.  No thyromegaly.  LYMPHATICS:  No cervical, axillary, or inguinal adenopathy.  LUNGS:  Clear to auscultation  bilaterally.  BACK:  No costovertebral angle tenderness.  CHEST:  Unremarkable.  HEART:  PMI not displaced or sustained.  S1 and S2 within normal limits.  No S3.  No S4.  No clicks.  No rubs.  No murmurs.  ABDOMEN:  Obese.  Positive bowel sounds.  Normal in frequency and pitch.  No bruits.  No rebound.  No guarding.  No midline pulsatile  mass.  No  hepatomegaly.  No splenomegaly.  SKIN:  No rashes.  No nodules.  EXTREMITIES:  Two plus pulses throughout.  No edema.  No cyanosis.  No  clubbing.  NEUROLOGIC:  Oriented to person, place, and time.  Cranial nerves 2  through 12 grossly intact.  Motor grossly intact.   EKG:  Sinus rhythm.  Rate 70.  Axis within normal limits.  Intervals  within normal limits.  No acute ST-T wave changes.   ASSESSMENT AND PLAN:  1. Chest discomfort.  The patient's chest discomfort has some typical      and some atypical features.  She said she would not be able to walk      on a treadmill because of her joint pain and back pain.  Therefore,      she needs an adenosine perfusion study.  Further evaluation based      on these results.  2. Dyspnea.  I will also check a BNP level.  If both the stress test      and the BNP level come back normal, then I would not suggest      further cardiovascular testing, but will refer her back to her      primary caregiver.     Rollene Rotunda, MD, [facility]  Electronically Signed    JH/MedQ  DD: 12/21/2006  DT: 12/22/2006  Job #: [id number]   cc:   [name]

## 2010-11-25 NOTE — Op Note (Signed)
NAME:  Doris Smith, Doris Smith                 ACCOUNT NO.:  [id number]   MEDICAL RECORD NO.:  [id number]          PATIENT TYPE:  INP   LOCATION:  0002                         FACILITY:  [facility]   PHYSICIAN:  Almedia Balls. Ranell Patrick, M.D. DATE OF BIRTH:  September 09, 1950   DATE OF PROCEDURE:  12/18/2005  DATE OF DISCHARGE:                                 OPERATIVE REPORT   PREOPERATIVE DIAGNOSIS:  Left knee osteoarthritis, end stage.   POSTOPERATIVE DIAGNOSIS:  Left knee osteoarthritis, end stage.   PROCEDURE PERFORMED:  Left knee replacement using DePuy Sigma rotating  platform prosthesis.   ATTENDING SURGEON:  Almedia Balls. Ranell Patrick, M.D.   ASSISTANT:  Donnie Coffin. Dixon, PA-C.   ANESTHESIA:  General plus femoral block.   ESTIMATED BLOOD LOSS:  Minimal.   TOURNIQUET TIME:  One hour, 30 minutes.   INSTRUMENT COUNT:  Correct.   COMPLICATIONS:  None.   PREOPERATIVE ANTIBIOTICS:  Given.   FLUIDS REPLACED:  Crystalloid 2000 cc.   INDICATIONS:  Patient is a 60 year old female with a history of worsening  left knee pain secondary to arthritis. Patient has had a prior knee  arthroscopy with limited relief.  She was noted to have significant grade 3  and grade 4 chondromalacia at that time.  The patient presents now for  operative treatment of her knee arthritis with total knee arthroplasty.  Options for management were discussed with the patient.  She elected to  proceed with surgical management.  Informed consent was obtained.   DESCRIPTION OF PROCEDURE:  After an adequate level of anesthesia was  achieved, the patient was positioned supine on the operating room table.  A  nonsterile tourniquet was placed in the left proximal thigh.  The left leg  was sterilely prepped and draped in the usual manner.  The other leg was  padded and securely appropriately to the operating room table.  After  sterile prep and drape, we exsanguinated the limb using esmarch bandage and  elevated the tourniquet to 300 mmHg.  A  longitudinal skin incision was  created with the knee in flexion.  Dissection carried down through the  subcutaneous tissue using Bovie electrocautery.  A medial parapatellar  arthrotomy was created, subluxing the patella laterally and everting.  We  went ahead and removed the ACL and PCL, and then entered the distal femur  with a step-cut drill.  Placed a 5 degree left distal femoral cutting block  on.  This was an intramedullary referenced block.  Then went ahead and cut  with a 10 mm resection, again, 5 degrees left.  Once we did this, we sized  the femur to a size 3 and then cut with a 4 and 1 cutting block, making both  the anterior, posterior, and chamfer cuts.  At this point, we subluxed the  tibia forward and prepared the tibia with an extramedullary guide, cutting 6  mm off the effected medial side with a slight posterior slope with attention  towards the 90 degree perpendicular cut.  Once we had this cut done, we then  sized the tibia, beginning with a size  3.  Made our box cut with the box-  cutting jig and then also repaired our tibia with our keel punch and  drilling out the well for the rotating platform prosthesis. We then trialed  with our trial components in place.  First a 10, then 12.5 insert.  We were  happy with a 12.5 with both flexion and extension stability.  Then  resurfaced the patella with free hand technique, taking it from a 22 down to  a size 14, replacing 8 with a size 35 patella.  We trialed the patella and  had nice, no touch tracking.  Removed all the trial components.  Thoroughly  pulse-irrigated everything and then cemented the components into place with  vacuum mixing, and allowed the cement to harden with the knee in extension  with an axial load with a 12.5 insert.  We took the knee through a full  range of motion.  We trialed the 15 as well.  She had a touch of looseness  on flexion but no instability that I could tell.  The 15 was too tight, and  we  were not able to obtain full extension; thus, we selected a 12.5 real  implant and implanted that into the knee.  Inspected the knee for any loose  cement.  None was encountered.  The patient was then thoroughly irrigated,  and then I closed over a drain with interrupted PDS suture followed by 2-0  Vicryl subcutaneously and 4-0 Monocryl for the skin.  Steri-Strips were  applied followed by a sterile dressing.  Patient was taken to the recovery  room, having tolerated the surgery well.           ______________________________  Almedia Balls. Ranell Patrick, M.D.     SRN/MEDQ  D:  12/18/2005  T:  12/18/2005  Job:  [id number]

## 2010-11-25 NOTE — Discharge Summary (Signed)
NAME:  Doris Smith, Doris Smith                             ACCOUNT NO.:  [id number]   MEDICAL RECORD NO.:  [id number]                   PATIENT TYPE:  IPS   LOCATION:  0303                                 FACILITY:  [facility]   PHYSICIAN:  Jeanice Lim, M.D.              DATE OF BIRTH:  10/03/1950   DATE OF ADMISSION:  06/17/2003  DATE OF DISCHARGE:  06/24/2003                                 DISCHARGE SUMMARY   IDENTIFYING DATA:  This is a 60 year old married Caucasian female  voluntarily admitted presenting with a history of depression, increased  anxiety, reporting to psychiatrist overdosed on Ativan, having conflict with  son.  Loves her husband but not in love with husband as per patient.  Having  conflict with close interpersonal relationships.   MEDICATIONS:  1. Allegra  2. Trazodone  3. Effexor XR  4. Aleve.   ALLERGIES:  SULFA and CODEINE.   PHYSICAL EXAMINATION:  Essentially within normal limits.  Neurologically  nonfocal.   LABORATORY DATA:  Routine admission labs essentially within normal limits.   MENTAL STATUS EXAM:  Alert, middle-aged, casually dressed.  Speech clear.  Mood exhaustive.  Affect tearful, depressed.  Thought processes goal  directed.  Thought content negative for dangerous ideation or psychotic  symptoms.  Cognition was intact.  Judgment and insight fair.   ADMISSION DIAGNOSES:  AXIS I:  Mood disorder not otherwise specified.  AXIS II:  Deferred.  AXIS III:  1.  Status post knee injury.  1. Back pain.  AXIS IV:  Moderate (problems with psychosocial issues, medical problems).  AXIS V:  35/60.   HOSPITAL COURSE:  The patient was admitted and ordered routine p.r.n.  medications and underwent further monitoring.  Was encouraged to participate  in individual, group and milieu therapy.  The patient was monitored for  safety and placed on safety checks.  The patient reported being very  depressed, feeling that nothing has worked for her.  Overdosed in November  with pills and reported current suicidal ideation but was able to contract  for safety.  The patient described feeling moody, having panic attacks,  restless, agitated, suspicious, paranoid.  The patient was stabilized on  Ambien, Ativan, Risperdal, Seroquel, Lamictal to stabilized mood, depressive  symptoms, acute anxiety and suspiciousness and thought agitation.  The  patient reported a positive response to medication changes.  Reported a  gradual improvement in mood but still initially had difficulty sleeping.  Was worried about facing stressors when outside of the hospital.  She  gradually improved through further medication changes and therapy, learning  healthier coping skills and how she can handle stressors outside the  hospital.  She was adjusted on medications.  Reported increased insight and  described decreased depression, anxiety and was less sedated with no side  effects from the medications at the time of discharge.   CONDITION ON DISCHARGE:  Improved.  There is no  dangerous ideation or  psychotic symptoms.   DISCHARGE MEDICATIONS:  1. Seroquel 25 mg, 2 q.h.s.  2. Protonix 40 mg b.i.d.  3. Effexor XR 150 mg, 2 q.a.m.  4. Allegra D 1 b.i.d.  5. Lamictal 25 mg q.h.s. x 13 days and then 2 q.h.s.  6. Ativan 1 mg q.a.m., 3 p.m. and 2 q.h.s.  7. Risperdal 0.5 mg q.h.s.   FOLLOW UP:  The patient was to follow up with [facility] Cage on Monday,  June 29, 2003 at 2 p.m.   DISCHARGE DIAGNOSES:  AXIS I:  Mood disorder not otherwise specified.  AXIS II:  Deferred.  AXIS III:  1.  Status post knee injury.  1. Back pain.  AXIS IV:  Moderate (problems with psychosocial issues, medical problems).  AXIS V:  Global Assessment of Functioning on discharge 55.                                               Jeanice Lim, M.D.    JEM/MEDQ  D:  07/19/2003  T:  07/19/2003  Job:  [id number]

## 2010-11-25 NOTE — H&P (Signed)
NAME:  Doris Smith, Doris Smith                 ACCOUNT NO.:  [id number]   MEDICAL RECORD NO.:  [id number]          PATIENT TYPE:  INP   LOCATION:  NA                           FACILITY:  [facility]   PHYSICIAN:  Almedia Balls. Ranell Patrick, M.D. DATE OF BIRTH:  1950-08-05   DATE OF ADMISSION:  12/18/2005  DATE OF DISCHARGE:                                HISTORY & PHYSICAL   CHIEF COMPLAINT:  Left knee pain.   HISTORY OF PRESENT ILLNESS:  The patient is a healthy 60 year old female  with worsening left knee pain that has been refractory to conservative  treatment.  The patient elected to have a left total knee arthroplasty by  Dr. Malon Kindle on December 18, 2005.   ALLERGIES:  1.  SULFA.  2.  CODEINE.   CURRENT MEDICATIONS:  1.  Tylenol Arthritis t.i.d. p.r.n.  2.  Nexium 40 mg p.o. daily p.r.n.  3.  Lorazepam 0.5 mg t.i.d. p.r.n.  4.  Allegra 180 mg p.o. p.r.n.  5.  Nasonex spray 2 sprays each nostril daily p.r.n.   PAST MEDICAL HISTORY:  1.  Migraines.  2.  GERD.  3.  Urinary tract infections.  4.  Osteoarthritis.   FAMILY MEDICAL HISTORY:  Coronary artery disease and osteoarthritis.   SOCIAL HISTORY:  A patient of Dr. Montey Hora.  She is married.  She works  as a Financial risk analyst.  She does not smoke or use alcohol.   REVIEW OF SYSTEMS:  Negative, except for pain with ambulation and the use of  a case.   PHYSICAL EXAMINATION:  VITAL SIGNS:  Pulse 84, respirations 18, blood  pressure 126/82.  GENERAL:  The patient is a healthy-appearing 60 year old female in no acute  distress.  Pleasant mood and affect.  Alert and oriented x3.  HEAD/NECK:  Cranial nerves II-XII grossly intact.  She has full range of  motion of the cervical spine and lumbar spine with no tenderness to  palpation of the paraspinous muscles or spinous process.  CHEST:  Active breath sounds bilaterally with no wheezes, rhonchi or rales.  HEART:  Regular rate and rhythm with no murmur.  ABDOMEN:  Nontender, nondistended, with active bowel  sounds and no masses.  EXTREMITIES:  Moderate crepitus of the left knee.  She does have an antalgic  gait and mild tenderness, especially in the medial aspect of the joint line  of the left knee.  Neurovascularly, she is intact distally, and she has no  rashes or pedal edema.   X-RAYS:  Left knee osteoarthritis that is end-stage.   IMPRESSION:  Left end-stage osteoarthritis of the knee.   PLAN:  Left total knee arthroplasty by Dr. Malon Kindle on December 18, 2005.      Thomas B. Dixon, P.A.    ______________________________  Almedia Balls. Ranell Patrick, M.D.    TBD/MEDQ  D:  12/06/2005  T:  12/06/2005  Job:  [id number]

## 2010-11-25 NOTE — Assessment & Plan Note (Signed)
[facility]                         GASTROENTEROLOGY OFFICE NOTE   NAME:COXTwanda, Stakes                          MRN:          [id number]  DATE:03/21/2007                            DOB:          04/19/1951    REFERRING PHYSICIAN:  Paulene Floor, FNP   REASON FOR CONSULTATION:  Chest pain and abdominal pain.   HISTORY OF PRESENT ILLNESS:  This is a 60 year old white female who  relates a 4-year history of chest pain.  She describes substernal  discomfort that radiates through to her back and occasionally her left  arm.  The pain is sharp.  It occasionally happens at rest.  It is  sometimes exacerbated by movement.  It is sometimes related to meals.  She has been evaluated by Rollene Rotunda, MD, [facility] and underwent an  Adenosine nuclear cardiac scan which was negative.  She has been treated  with Nexium for reflux symptoms.  She has generally used Nexium on  demand in the past, but has been taking it regularly for about one month  with no change in symptoms.  She does relate a chronic cough,  generalized abdominal pain, abdominal bloating, alternating diarrhea and  constipation, and irritable bowel syndrome.  She has bipolar disorder  and her history is sometimes wandering and tangential.   FAMILY HISTORY:  Negative for colon cancer.  Her father has had colon  polyps.  No family history of inflammatory bowel disease.   PAST MEDICAL HISTORY:  Hypertension, bipolar disorder, arthritis,  allergies, status post partial hysterectomy, status post left total knee  replacement, history of L2-L5 lumbar spine disease, irritable bowel  syndrome.   CURRENT MEDICATIONS:  Listed on the chart, updated and reviewed.   ALLERGIES:  SULFA, CODEINE.   SOCIAL HISTORY:   REVIEW OF SYSTEMS:  Per the handwritten form.   PHYSICAL EXAMINATION:  GENERAL:  Overweight white female.  VITAL SIGNS:  Height 5 feet 5 inches, weight 214.6 pounds, blood  pressure 128/68, pulse 76  and regular.  CHEST:  Clear to auscultation bilaterally without chest wall tenderness  appreciated.  HEART:  Regular rate and rhythm without murmurs appreciated.  ABDOMEN:  Soft with mild diffuse tenderness to deep palpation.  No  rebound or guarding.  No palpable organomegaly, masses, or hernias.  Normal active bowel sounds.  EXTREMITIES:  Without cyanosis, clubbing, or edema.  NEUROLOGY:  Alert and oriented x3.  Grossly nonfocal.   ASSESSMENT:  Chest pain, abd. pain, gastroesophageal reflux disease,  abd. bloating, irritable bowel syndrome, and a father with colon polyps.  She is recommended to continue Nexium 40 mg p.o. q.a.m. and begin all  standard antireflux measures with the use of 4-inch bed blocks.  Most of  her chest pain does not appear to be related to GERD.  Risks, benefits,  and alternatives to colonoscopy with possible biopsy and possible  polypectomy and upper endoscopy with possible bowel sounds discussed  with the patient.  She consents to proceed.  This will be scheduled  electively.  If her symptoms are not improved with the above measures,  we will plan for  further evaluation with abdominal ultrasound imaging  and possibly a gastric emptying scan.  Consider starting an  antispasmodic following her colonoscopy.     Venita Lick. Russella Dar, MD, [facility]  Electronically Signed    MTS/MedQ  DD: 03/25/2007  DT: 03/25/2007  Job #: [id number]

## 2010-11-25 NOTE — Discharge Summary (Signed)
NAME:  Doris Smith, Doris Smith                 ACCOUNT NO.:  [id number]   MEDICAL RECORD NO.:  [id number]          PATIENT TYPE:  INP   LOCATION:  1516                         FACILITY:  [facility]   PHYSICIAN:  Almedia Balls. Ranell Patrick, M.D. DATE OF BIRTH:  1951-05-12   DATE OF ADMISSION:  12/18/2005  DATE OF DISCHARGE:                                 DISCHARGE SUMMARY   ADMISSION DIAGNOSES:  1.  Left knee osteoarthritis, end-stage.  2.  Gastroesophageal reflux disease.   DISCHARGE DIAGNOSES:  1.  Left knee osteoarthritis status post left total knee arthroplasty.  2.  Gastroesophageal reflux disease.   BRIEF HISTORY:  The patient is a 60 year old healthy-appearing female with  worsening left knee pain who has elected to have a left total knee  arthroplasty by Dr. Malon Kindle for symptoms refractory to conservative  treatment.   PROCEDURE:  The patient had a left total knee arthroplasty on December 18, 2005  using a Hospital doctor prosthesis.   ATTENDING SURGEON:  Almedia Balls. Ranell Patrick, M.D.   ASSISTANT:  Donnie Coffin. Dixon, P.A.   ESTIMATED BLOOD LOSS:  Minimal.   TOURNIQUET TIME:  1 hour 30 minutes.   FLUID REPLACEMENT:  2000 mL.   URINE OUTPUT:  300 mL.   HOSPITAL COURSE:  The patient was admitted on December 18, 2005 for the above-  stated procedure and after adequate time in the post anesthesia care unit,  she was transferred up to 5000.  Postop day 1, she was complaining about a  lot of pain to that left knee but was able to work with physical therapy and  work with the CPM machine.  Postop days 2 and 3, she continued to improve  with decreasing pain and increasing distance in regards to walking.  Thus,  it was decided to send her home once her INR was completely therapeutic.  On  postop day 3, it was only 1.5 and thus we decided to send her home on postop  day 4.  The patient was afebrile throughout her stay and latest lab work  shows some mild postoperative anemia but no symptoms with  H&H of 10 and 30.   DISCHARGE PLAN:  The patient will be December 22, 2005.   DIET:  Regular.   DISCHARGE MEDICATIONS:  1.  Robaxin 500 mg p.o. q.6 h.  2.  Percocet 1-2 p.o. q.6 h.  Dose is 5/325.  3.  Colace 100 mg p.o. b.i.d.   ALLERGIES:  THE PATIENT HAS ALLERGY TO SULFA AND CODEINE.   FOLLOWUP:  The patient is to follow back up with Dr. Ranell Patrick in 7-10 days and  her condition is stable.      Thomas B. Dixon, P.A.    ______________________________  Almedia Balls. Ranell Patrick, M.D.    TBD/MEDQ  D:  12/21/2005  T:  12/21/2005  Job:  [id number]

## 2011-04-10 DIAGNOSIS — G4730 Sleep apnea, unspecified: Secondary | ICD-10-CM

## 2011-04-10 HISTORY — DX: Sleep apnea, unspecified: G47.30

## 2011-05-18 ENCOUNTER — Encounter (HOSPITAL_COMMUNITY): Payer: Self-pay | Admitting: Pharmacy Technician

## 2011-05-23 ENCOUNTER — Other Ambulatory Visit (HOSPITAL_COMMUNITY): Payer: BC Managed Care – PPO

## 2011-05-25 ENCOUNTER — Encounter (HOSPITAL_COMMUNITY): Payer: Self-pay

## 2011-05-25 ENCOUNTER — Encounter (HOSPITAL_COMMUNITY)
Admission: RE | Admit: 2011-05-25 | Discharge: 2011-05-25 | Payer: BC Managed Care – PPO | Source: Ambulatory Visit | Attending: Specialist | Admitting: Specialist

## 2011-05-25 ENCOUNTER — Ambulatory Visit (HOSPITAL_COMMUNITY)
Admission: RE | Admit: 2011-05-25 | Discharge: 2011-05-25 | Disposition: A | Discharge status: Home / Self Care | Payer: BC Managed Care – PPO | Source: Ambulatory Visit | Attending: Specialist | Admitting: Specialist

## 2011-05-25 ENCOUNTER — Other Ambulatory Visit: Payer: Self-pay | Admitting: Specialist

## 2011-05-25 ENCOUNTER — Other Ambulatory Visit: Payer: Self-pay

## 2011-05-25 DIAGNOSIS — Z01818 Encounter for other preprocedural examination: Secondary | ICD-10-CM | POA: Insufficient documentation

## 2011-05-25 DIAGNOSIS — M51379 Other intervertebral disc degeneration, lumbosacral region without mention of lumbar back pain or lower extremity pain: Secondary | ICD-10-CM | POA: Insufficient documentation

## 2011-05-25 DIAGNOSIS — Z01812 Encounter for preprocedural laboratory examination: Secondary | ICD-10-CM | POA: Insufficient documentation

## 2011-05-25 DIAGNOSIS — M47817 Spondylosis without myelopathy or radiculopathy, lumbosacral region: Secondary | ICD-10-CM | POA: Insufficient documentation

## 2011-05-25 DIAGNOSIS — M5137 Other intervertebral disc degeneration, lumbosacral region: Secondary | ICD-10-CM | POA: Insufficient documentation

## 2011-05-25 DIAGNOSIS — F329 Major depressive disorder, single episode, unspecified: Secondary | ICD-10-CM | POA: Insufficient documentation

## 2011-05-25 DIAGNOSIS — M545 Low back pain, unspecified: Secondary | ICD-10-CM | POA: Insufficient documentation

## 2011-05-25 DIAGNOSIS — M48061 Spinal stenosis, lumbar region without neurogenic claudication: Secondary | ICD-10-CM | POA: Insufficient documentation

## 2011-05-25 DIAGNOSIS — I10 Essential (primary) hypertension: Secondary | ICD-10-CM | POA: Insufficient documentation

## 2011-05-25 DIAGNOSIS — Z79899 Other long term (current) drug therapy: Secondary | ICD-10-CM | POA: Insufficient documentation

## 2011-05-25 DIAGNOSIS — Z01810 Encounter for preprocedural cardiovascular examination: Secondary | ICD-10-CM | POA: Insufficient documentation

## 2011-05-25 DIAGNOSIS — M79609 Pain in unspecified limb: Secondary | ICD-10-CM | POA: Insufficient documentation

## 2011-05-25 DIAGNOSIS — F3289 Other specified depressive episodes: Secondary | ICD-10-CM | POA: Insufficient documentation

## 2011-05-25 HISTORY — DX: Major depressive disorder, single episode, unspecified: F32.9

## 2011-05-25 HISTORY — DX: Anxiety disorder, unspecified: F41.9

## 2011-05-25 HISTORY — DX: Gastro-esophageal reflux disease without esophagitis: K21.9

## 2011-05-25 HISTORY — DX: Other specified postprocedural states: Z98.890

## 2011-05-25 HISTORY — DX: Nausea with vomiting, unspecified: R11.2

## 2011-05-25 HISTORY — DX: Mental disorder, not otherwise specified: F99

## 2011-05-25 HISTORY — DX: Sleep apnea, unspecified: G47.30

## 2011-05-25 HISTORY — DX: Other fracture of unspecified lower leg, initial encounter for closed fracture: S82.899A

## 2011-05-25 HISTORY — DX: Depression, unspecified: F32.A

## 2011-05-25 LAB — COMPREHENSIVE METABOLIC PANEL
ALT: 12 U/L (ref 0–35)
AST: 15 U/L (ref 0–37)
Albumin: 3.6 g/dL (ref 3.5–5.2)
Alkaline Phosphatase: 96 U/L (ref 39–117)
BUN: 9 mg/dL (ref 6–23)
CO2: 29 mEq/L (ref 19–32)
Calcium: 8.8 mg/dL (ref 8.4–10.5)
Chloride: 103 mEq/L (ref 96–112)
Creatinine, Ser: 0.91 mg/dL (ref 0.50–1.10)
GFR calc Af Amer: 79 mL/min — ABNORMAL LOW (ref >90)
GFR calc non Af Amer: 68 mL/min — ABNORMAL LOW (ref >90)
Glucose, Bld: 92 mg/dL (ref 70–99)
Potassium: 3.5 mEq/L (ref 3.5–5.1)
Sodium: 140 mEq/L (ref 135–145)
Total Bilirubin: 0.4 mg/dL (ref 0.3–1.2)
Total Protein: 6.6 g/dL (ref 6.0–8.3)

## 2011-05-25 LAB — APTT: aPTT: 46 seconds — ABNORMAL HIGH (ref 24–37)

## 2011-05-25 LAB — URINALYSIS, ROUTINE W REFLEX MICROSCOPIC
Bilirubin Urine: NEGATIVE
Glucose, UA: NEGATIVE mg/dL
Hgb urine dipstick: NEGATIVE
Ketones, ur: NEGATIVE mg/dL
Nitrite: NEGATIVE
Protein, ur: NEGATIVE mg/dL
Specific Gravity, Urine: 1.011 (ref 1.005–1.030)
Urobilinogen, UA: 0.2 mg/dL (ref 0.0–1.0)
pH: 7 (ref 5.0–8.0)

## 2011-05-25 LAB — CBC
HCT: 39.9 % (ref 36.0–46.0)
Hemoglobin: 13 g/dL (ref 12.0–15.0)
MCH: 29.3 pg (ref 26.0–34.0)
MCHC: 32.6 g/dL (ref 30.0–36.0)
MCV: 90.1 fL (ref 78.0–100.0)
Platelets: 242 10*3/uL (ref 150–400)
RBC: 4.43 MIL/uL (ref 3.87–5.11)
RDW: 13.1 % (ref 11.5–15.5)
WBC: 5.3 10*3/uL (ref 4.0–10.5)

## 2011-05-25 LAB — PROTIME-INR
INR: 1.05 (ref 0.00–1.49)
Prothrombin Time: 13.9 seconds (ref 11.6–15.2)

## 2011-05-25 LAB — SURGICAL PCR SCREEN
MRSA, PCR: NEGATIVE
Staphylococcus aureus: NEGATIVE

## 2011-05-25 NOTE — Patient Instructions (Signed)
20 Doris Smith  05/25/2011   Your procedure is scheduled on:  Wed. 05/31/2011  Report to [facility] at 0630 AM.  Call this number if you have problems the morning of surgery: [phone]   Remember:   Do not eat food:After Midnight.  Do not drink clear liquids: After Midnight.  Take these medicines the morning of surgery with A SIP OF WATER: Buspar, Prilosec, use Albuterol inhaler, use Veramyst inhaler, use Dulera inhaler, and Omnaris nasal spray   Do not wear jewelry, make-up or nail polish.  Do not wear lotions, powders, or perfumes.   Do not shave 48 hours prior to surgery.  Do not bring valuables to the hospital.  Contacts, dentures or bridgework may not be worn into surgery.  Leave suitcase in the car. After surgery it may be brought to your room.  For patients admitted to the hospital, checkout time is 11:00 AM the day of discharge.   Patients discharged the day of surgery will not be allowed to drive home.  Name and phone number of your driver:   Special Instructions: CHG Shower Use Special Wash: 1/2 bottle night before surgery and 1/2 bottle morning of surgery.   Please read over the following fact sheets that you were given: MRSA Information

## 2011-05-31 ENCOUNTER — Encounter (HOSPITAL_COMMUNITY): Payer: Self-pay | Admitting: Anesthesiology

## 2011-05-31 ENCOUNTER — Ambulatory Visit (HOSPITAL_COMMUNITY): Payer: BC Managed Care – PPO

## 2011-05-31 ENCOUNTER — Encounter (HOSPITAL_COMMUNITY): Payer: Self-pay | Admitting: *Deleted

## 2011-05-31 ENCOUNTER — Encounter (HOSPITAL_COMMUNITY)
Admission: RE | Disposition: A | Discharge status: Home / Self Care | Payer: Self-pay | Source: Ambulatory Visit | Attending: Specialist

## 2011-05-31 ENCOUNTER — Inpatient Hospital Stay (HOSPITAL_COMMUNITY)
Admission: RE | Admit: 2011-05-31 | Discharge: 2011-06-01 | DRG: 758 | Disposition: A | Discharge status: Home / Self Care | Payer: BC Managed Care – PPO | Source: Ambulatory Visit | Attending: Specialist | Admitting: Specialist

## 2011-05-31 ENCOUNTER — Ambulatory Visit (HOSPITAL_COMMUNITY): Payer: BC Managed Care – PPO | Admitting: Anesthesiology

## 2011-05-31 ENCOUNTER — Other Ambulatory Visit (HOSPITAL_COMMUNITY): Payer: BC Managed Care – PPO

## 2011-05-31 DIAGNOSIS — I10 Essential (primary) hypertension: Secondary | ICD-10-CM | POA: Diagnosis present

## 2011-05-31 DIAGNOSIS — M5137 Other intervertebral disc degeneration, lumbosacral region: Secondary | ICD-10-CM | POA: Diagnosis present

## 2011-05-31 DIAGNOSIS — M48061 Spinal stenosis, lumbar region without neurogenic claudication: Principal | ICD-10-CM | POA: Diagnosis present

## 2011-05-31 DIAGNOSIS — Z01812 Encounter for preprocedural laboratory examination: Secondary | ICD-10-CM

## 2011-05-31 DIAGNOSIS — J45909 Unspecified asthma, uncomplicated: Secondary | ICD-10-CM | POA: Diagnosis present

## 2011-05-31 DIAGNOSIS — M51379 Other intervertebral disc degeneration, lumbosacral region without mention of lumbar back pain or lower extremity pain: Secondary | ICD-10-CM | POA: Diagnosis present

## 2011-05-31 DIAGNOSIS — K219 Gastro-esophageal reflux disease without esophagitis: Secondary | ICD-10-CM | POA: Diagnosis present

## 2011-05-31 DIAGNOSIS — F319 Bipolar disorder, unspecified: Secondary | ICD-10-CM | POA: Diagnosis present

## 2011-05-31 DIAGNOSIS — Z96659 Presence of unspecified artificial knee joint: Secondary | ICD-10-CM

## 2011-05-31 DIAGNOSIS — G43909 Migraine, unspecified, not intractable, without status migrainosus: Secondary | ICD-10-CM | POA: Diagnosis present

## 2011-05-31 DIAGNOSIS — Z6833 Body mass index (BMI) 33.0-33.9, adult: Secondary | ICD-10-CM

## 2011-05-31 DIAGNOSIS — G4733 Obstructive sleep apnea (adult) (pediatric): Secondary | ICD-10-CM | POA: Diagnosis present

## 2011-05-31 DIAGNOSIS — Z90710 Acquired absence of both cervix and uterus: Secondary | ICD-10-CM

## 2011-05-31 DIAGNOSIS — Z79899 Other long term (current) drug therapy: Secondary | ICD-10-CM

## 2011-05-31 HISTORY — PX: LUMBAR LAMINECTOMY/DECOMPRESSION MICRODISCECTOMY: SHX5026

## 2011-05-31 SURGERY — LUMBAR LAMINECTOMY/DECOMPRESSION MICRODISCECTOMY
Anesthesia: General | Site: Back | Laterality: Left | Wound class: Clean

## 2011-05-31 MED ORDER — FENTANYL CITRATE 0.05 MG/ML IJ SOLN
INTRAMUSCULAR | Status: DC | PRN
  Administered 2011-05-31: 100 ug via INTRAVENOUS

## 2011-05-31 MED ORDER — DULOXETINE HCL 60 MG PO CPEP
60.0000 mg | ORAL_CAPSULE | Freq: Every day | ORAL | Status: DC
  Administered 2011-06-01: 60 mg via ORAL
  Filled 2011-05-31 (×2): qty 1

## 2011-05-31 MED ORDER — BUPIVACAINE-EPINEPHRINE 0.5% -1:200000 IJ SOLN
INTRAMUSCULAR | Status: DC | PRN
  Administered 2011-05-31: 30 mL

## 2011-05-31 MED ORDER — MOMETASONE FURO-FORMOTEROL FUM 100-5 MCG/ACT IN AERO
2.0000 | INHALATION_SPRAY | Freq: Two times a day (BID) | RESPIRATORY_TRACT | Status: DC
  Administered 2011-06-01: 2 via RESPIRATORY_TRACT
  Filled 2011-05-31: qty 13

## 2011-05-31 MED ORDER — LACTATED RINGERS IV SOLN
INTRAVENOUS | Status: DC | PRN
  Administered 2011-05-31 (×2): via INTRAVENOUS

## 2011-05-31 MED ORDER — PANTOPRAZOLE SODIUM 40 MG PO TBEC
80.0000 mg | DELAYED_RELEASE_TABLET | Freq: Every day | ORAL | Status: DC
  Administered 2011-05-31 – 2011-06-01 (×2): 80 mg via ORAL
  Filled 2011-05-31 (×3): qty 2

## 2011-05-31 MED ORDER — HYDROCODONE-ACETAMINOPHEN 5-325 MG PO TABS: 1.0000 | ORAL_TABLET | ORAL | Status: AC | PRN

## 2011-05-31 MED ORDER — FENTANYL CITRATE 0.05 MG/ML IJ SOLN
25.0000 ug | INTRAMUSCULAR | Status: DC | PRN
  Administered 2011-05-31: 25 ug via INTRAVENOUS
  Administered 2011-05-31: 50 ug via INTRAVENOUS
  Administered 2011-05-31: 25 ug via INTRAVENOUS

## 2011-05-31 MED ORDER — LIDOCAINE HCL (CARDIAC) 20 MG/ML IV SOLN
INTRAVENOUS | Status: DC | PRN
  Administered 2011-05-31: 60 mg via INTRAVENOUS

## 2011-05-31 MED ORDER — HYDROMORPHONE HCL PF 1 MG/ML IJ SOLN: 0.2500 mg | INTRAMUSCULAR | Status: DC | PRN

## 2011-05-31 MED ORDER — FLUTICASONE PROPIONATE 50 MCG/ACT NA SUSP
1.0000 | Freq: Two times a day (BID) | NASAL | Status: DC
  Administered 2011-06-01: 1 via NASAL
  Filled 2011-05-31: qty 16

## 2011-05-31 MED ORDER — LAMOTRIGINE 100 MG PO TABS
100.0000 mg | ORAL_TABLET | Freq: Every day | ORAL | Status: DC
  Administered 2011-06-01: 100 mg via ORAL
  Filled 2011-05-31 (×2): qty 1

## 2011-05-31 MED ORDER — MORPHINE SULFATE 4 MG/ML IJ SOLN
1.0000 mg | INTRAMUSCULAR | Status: DC | PRN
  Administered 2011-05-31: 2 mg via INTRAVENOUS
  Administered 2011-05-31 – 2011-06-01 (×2): 1 mg via INTRAVENOUS
  Filled 2011-05-31 (×3): qty 1

## 2011-05-31 MED ORDER — SODIUM CHLORIDE 0.9 % IR SOLN
Status: DC | PRN
  Administered 2011-05-31: 09:00:00

## 2011-05-31 MED ORDER — HYDROCODONE-ACETAMINOPHEN 5-325 MG PO TABS
1.0000 | ORAL_TABLET | ORAL | Status: DC | PRN
  Administered 2011-06-01: 1 via ORAL
  Administered 2011-06-01: 2 via ORAL
  Filled 2011-05-31: qty 1
  Filled 2011-05-31: qty 2

## 2011-05-31 MED ORDER — HYOSCYAMINE SULFATE 0.125 MG PO TBDP
0.1250 mg | ORAL_TABLET | Freq: Two times a day (BID) | ORAL | Status: DC
  Administered 2011-05-31 – 2011-06-01 (×3): 0.125 mg via SUBLINGUAL
  Filled 2011-05-31 (×4): qty 1

## 2011-05-31 MED ORDER — SODIUM CHLORIDE 0.9 % IV SOLN
INTRAVENOUS | Status: DC
  Administered 2011-05-31: 15:00:00 via INTRAVENOUS

## 2011-05-31 MED ORDER — METHOCARBAMOL 500 MG PO TABS: 500.0000 mg | ORAL_TABLET | Freq: Four times a day (QID) | ORAL | Status: DC | PRN

## 2011-05-31 MED ORDER — ACETAMINOPHEN 10 MG/ML IV SOLN
INTRAVENOUS | Status: DC | PRN
  Administered 2011-05-31: 1000 mg via INTRAVENOUS

## 2011-05-31 MED ORDER — PROMETHAZINE HCL 25 MG/ML IJ SOLN: 6.2500 mg | INTRAMUSCULAR | Status: DC | PRN

## 2011-05-31 MED ORDER — LACTATED RINGERS IV SOLN: INTRAVENOUS | Status: DC

## 2011-05-31 MED ORDER — CEFAZOLIN SODIUM-DEXTROSE 2-3 GM-% IV SOLR
2.0000 g | INTRAVENOUS | Status: AC
  Administered 2011-05-31: 2 g via INTRAVENOUS
  Filled 2011-05-31: qty 50

## 2011-05-31 MED ORDER — ONDANSETRON HCL 4 MG/2ML IJ SOLN
INTRAMUSCULAR | Status: DC | PRN
  Administered 2011-05-31: 4 mg via INTRAVENOUS

## 2011-05-31 MED ORDER — LORAZEPAM 0.5 MG PO TABS: 0.5000 mg | ORAL_TABLET | Freq: Three times a day (TID) | ORAL | Status: DC | PRN

## 2011-05-31 MED ORDER — LOPERAMIDE HCL 2 MG PO CAPS: 2.0000 mg | ORAL_CAPSULE | Freq: Four times a day (QID) | ORAL | Status: DC | PRN

## 2011-05-31 MED ORDER — DEXAMETHASONE SODIUM PHOSPHATE 10 MG/ML IJ SOLN
INTRAMUSCULAR | Status: DC | PRN
  Administered 2011-05-31: 10 mg via INTRAVENOUS

## 2011-05-31 MED ORDER — MENTHOL 3 MG MT LOZG
1.0000 | LOZENGE | OROMUCOSAL | Status: DC | PRN
  Administered 2011-06-01 (×3): 3 mg via ORAL
  Filled 2011-05-31: qty 9

## 2011-05-31 MED ORDER — ACETAMINOPHEN 325 MG PO TABS: 650.0000 mg | ORAL_TABLET | ORAL | Status: DC | PRN

## 2011-05-31 MED ORDER — CICLESONIDE 50 MCG/ACT NA SUSP
1.0000 | Freq: Every day | NASAL | Status: DC
  Administered 2011-06-01: 1 via NASAL

## 2011-05-31 MED ORDER — EPHEDRINE SULFATE 50 MG/ML IJ SOLN
INTRAMUSCULAR | Status: DC | PRN
  Administered 2011-05-31: 5 mg via INTRAVENOUS

## 2011-05-31 MED ORDER — BUSPIRONE HCL 10 MG PO TABS
10.0000 mg | ORAL_TABLET | Freq: Three times a day (TID) | ORAL | Status: DC
  Administered 2011-05-31 – 2011-06-01 (×3): 10 mg via ORAL
  Filled 2011-05-31 (×6): qty 1

## 2011-05-31 MED ORDER — METHOCARBAMOL 100 MG/ML IJ SOLN
500.0000 mg | Freq: Four times a day (QID) | INTRAVENOUS | Status: DC | PRN
  Administered 2011-05-31: 500 mg via INTRAVENOUS
  Filled 2011-05-31 (×2): qty 5

## 2011-05-31 MED ORDER — MIDAZOLAM HCL 5 MG/5ML IJ SOLN
INTRAMUSCULAR | Status: DC | PRN
  Administered 2011-05-31: 2 mg via INTRAVENOUS

## 2011-05-31 MED ORDER — DOCUSATE SODIUM 100 MG PO CAPS
100.0000 mg | ORAL_CAPSULE | Freq: Two times a day (BID) | ORAL | Status: DC
  Administered 2011-05-31 – 2011-06-01 (×3): 100 mg via ORAL
  Filled 2011-05-31 (×4): qty 1

## 2011-05-31 MED ORDER — HYDROMORPHONE HCL PF 1 MG/ML IJ SOLN
0.2500 mg | INTRAMUSCULAR | Status: DC | PRN
  Administered 2011-05-31 (×2): 0.5 mg via INTRAVENOUS

## 2011-05-31 MED ORDER — ONDANSETRON HCL 4 MG/2ML IJ SOLN: 4.0000 mg | INTRAMUSCULAR | Status: DC | PRN

## 2011-05-31 MED ORDER — PROPOFOL 10 MG/ML IV EMUL
INTRAVENOUS | Status: DC | PRN
  Administered 2011-05-31: 150 mg via INTRAVENOUS

## 2011-05-31 MED ORDER — FLUTICASONE FUROATE 27.5 MCG/SPRAY NA SUSP: 1.0000 | Freq: Two times a day (BID) | NASAL | Status: DC

## 2011-05-31 MED ORDER — ROCURONIUM BROMIDE 100 MG/10ML IV SOLN
INTRAVENOUS | Status: DC | PRN
  Administered 2011-05-31: 50 mg via INTRAVENOUS

## 2011-05-31 MED ORDER — ALBUTEROL SULFATE HFA 108 (90 BASE) MCG/ACT IN AERS
2.0000 | INHALATION_SPRAY | RESPIRATORY_TRACT | Status: DC | PRN
  Filled 2011-05-31: qty 6.7

## 2011-05-31 MED ORDER — CEFAZOLIN SODIUM 1-5 GM-% IV SOLN
1.0000 g | Freq: Three times a day (TID) | INTRAVENOUS | Status: AC
  Administered 2011-05-31 – 2011-06-01 (×2): 1 g via INTRAVENOUS
  Filled 2011-05-31 (×2): qty 50

## 2011-05-31 MED ORDER — PHENOL 1.4 % MT LIQD: 1.0000 | OROMUCOSAL | Status: DC | PRN

## 2011-05-31 MED ORDER — THROMBIN 5000 UNITS EX KIT
PACK | CUTANEOUS | Status: DC | PRN
  Administered 2011-05-31: 10000 [IU] via TOPICAL

## 2011-05-31 MED ORDER — ACETAMINOPHEN 650 MG RE SUPP: 650.0000 mg | RECTAL | Status: DC | PRN

## 2011-05-31 MED ORDER — ACETAMINOPHEN 500 MG PO TABS: 500.0000 mg | ORAL_TABLET | Freq: Four times a day (QID) | ORAL | Status: DC | PRN

## 2011-05-31 SURGICAL SUPPLY — 46 items
BAG ZIPLOCK 12X15 (MISCELLANEOUS) ×2 IMPLANT
BENZOIN TINCTURE PRP APPL 2/3 (GAUZE/BANDAGES/DRESSINGS) ×2 IMPLANT
CHLORAPREP W/TINT 26ML (MISCELLANEOUS) IMPLANT
CLEANER TIP ELECTROSURG 2X2 (MISCELLANEOUS) ×2 IMPLANT
CLOTH BEACON ORANGE TIMEOUT ST (SAFETY) ×2 IMPLANT
DECANTER SPIKE VIAL GLASS SM (MISCELLANEOUS) ×2 IMPLANT
DRAPE MICROSCOPE LEICA (MISCELLANEOUS) ×2 IMPLANT
DRAPE POUCH INSTRU U-SHP 10X18 (DRAPES) ×2 IMPLANT
DRAPE SURG 17X11 SM STRL (DRAPES) ×2 IMPLANT
DRSG EMULSION OIL 3X3 NADH (GAUZE/BANDAGES/DRESSINGS) IMPLANT
DRSG PAD ABDOMINAL 8X10 ST (GAUZE/BANDAGES/DRESSINGS) IMPLANT
DRSG TELFA 4X10 ISLAND STR (GAUZE/BANDAGES/DRESSINGS) ×2 IMPLANT
DRSG TELFA 4X5 ISLAND ADH (GAUZE/BANDAGES/DRESSINGS) ×2 IMPLANT
DURAPREP 26ML APPLICATOR (WOUND CARE) ×4 IMPLANT
ELECT REM PT RETURN 9FT ADLT (ELECTROSURGICAL) ×2
ELECTRODE REM PT RTRN 9FT ADLT (ELECTROSURGICAL) ×1 IMPLANT
GLOVE BIOGEL PI IND STRL 6.5 (GLOVE) ×1 IMPLANT
GLOVE BIOGEL PI IND STRL 8 (GLOVE) ×1 IMPLANT
GLOVE BIOGEL PI INDICATOR 6.5 (GLOVE) ×1
GLOVE BIOGEL PI INDICATOR 8 (GLOVE) ×1
GLOVE ECLIPSE 6.5 STRL STRAW (GLOVE) ×2 IMPLANT
GLOVE SURG SS PI 8.0 STRL IVOR (GLOVE) ×4 IMPLANT
IV CATH 14GX2 1/4 (CATHETERS) ×2 IMPLANT
KIT BASIN OR (CUSTOM PROCEDURE TRAY) ×2 IMPLANT
KIT POSITIONING SURG ANDREWS (MISCELLANEOUS) ×2 IMPLANT
MANIFOLD NEPTUNE II (INSTRUMENTS) ×2 IMPLANT
NEEDLE SPNL 18GX3.5 QUINCKE PK (NEEDLE) ×4 IMPLANT
PATTIES SURGICAL .5 X.5 (GAUZE/BANDAGES/DRESSINGS) IMPLANT
PATTIES SURGICAL .75X.75 (GAUZE/BANDAGES/DRESSINGS) IMPLANT
PATTIES SURGICAL 1X1 (DISPOSABLE) IMPLANT
SPONGE LAP 4X18 X RAY DECT (DISPOSABLE) ×2 IMPLANT
SPONGE SURGIFOAM ABS GEL 100 (HEMOSTASIS) ×2 IMPLANT
STAPLER VISISTAT (STAPLE) IMPLANT
STRIP CLOSURE SKIN 1/2X4 (GAUZE/BANDAGES/DRESSINGS) IMPLANT
SUT PROLENE 3 0 PS 2 (SUTURE) IMPLANT
SUT VIC AB 0 CT1 27 (SUTURE)
SUT VIC AB 0 CT1 27XBRD ANTBC (SUTURE) IMPLANT
SUT VIC AB 1 CT1 27 (SUTURE) ×1
SUT VIC AB 1 CT1 27XBRD ANTBC (SUTURE) ×1 IMPLANT
SUT VIC AB 1-0 CT2 27 (SUTURE) IMPLANT
SUT VIC AB 2-0 CT1 27 (SUTURE) ×1
SUT VIC AB 2-0 CT1 TAPERPNT 27 (SUTURE) ×1 IMPLANT
SUT VICRYL 0 UR6 27IN ABS (SUTURE) IMPLANT
SYRINGE 10CC LL (SYRINGE) ×4 IMPLANT
TRAY LAMINECTOMY (CUSTOM PROCEDURE TRAY) ×2 IMPLANT
YANKAUER SUCT BULB TIP NO VENT (SUCTIONS) ×2 IMPLANT

## 2011-05-31 NOTE — Brief Op Note (Signed)
05/31/2011  10:17 AM  PATIENT:  Doris Smith  60 y.o. female  PRE-OPERATIVE DIAGNOSIS:  Stenosis Lumbar five - Sacral one  POST-OPERATIVE DIAGNOSIS:  Spinal stenosis Lumbar five sacral one  PROCEDURE:  Procedure(s): LUMBAR LAMINECTOMY/DECOMPRESSION MICRODISCECTOMY  SURGEON:  Surgeon(s): [employer]  PHYSICIAN ASSISTANT:   ASSISTANTS: christine strader   ANESTHESIA:   general  EBL:  Total I/O In: 1000 [I.V.:1000] Out: 50 [Blood:50]  BLOOD ADMINISTERED:none  DRAINS: none   LOCAL MEDICATIONS USED:  MARCAINE 20CC  SPECIMEN:  No Specimen  DISPOSITION OF SPECIMEN:  N/A  COUNTS:  YES  TOURNIQUET:  * No tourniquets in log *  DICTATION: .Other Dictation: Dictation Number [id number]  PLAN OF CARE: Admit for overnight observation  PATIENT DISPOSITION:  PACU - hemodynamically stable.   Delay start of Pharmacological VTE agent (>24hrs) due to surgical blood loss or risk of bleeding:  {YES/NO/NOT APPLICABLE:20182

## 2011-05-31 NOTE — Op Note (Signed)
Doris Smith, Doris Smith                 ACCOUNT NO.:  [id number]  MEDICAL RECORD NO.:  [id number]  LOCATION:  [facility]                         FACILITY:  [facility]  PHYSICIAN:  Jene Every, M.D.    DATE OF BIRTH:  [birth year]  DATE OF PROCEDURE:  05/31/2011 DATE OF DISCHARGE:                              OPERATIVE REPORT   PREOPERATIVE DIAGNOSIS:  Spinal stenosis, L5-S1 left, L4-L5.  POSTOPERATIVE DIAGNOSIS:  Spinal stenosis, L5-S1 left, L4-L5.  PROCEDURE PERFORMED: 1. Left __________ hemilaminectomy with foraminotomies of L5 and S1. 2. Decompression of L5-S1, L4-5.  ANESTHESIA:  General.  ASSISTANT:  Roma Schanz, P.A.  HISTORY:  This is a 60 year old female, who has left lower extremity radicular pain, L5 and S1 nerve root distribution, secondary facet hypertrophy compressing the L5 and S1 nerve roots.  This was noted on her MRI with associated disk degeneration.  She did have associated back pain, but predominantly radicular pain, positive neural tension signs, EHL weakness.  MRI indicating neuroforaminal stenosis.  She also had diminished plantar flexion noted __________ sensation at L5 dermatome. MRI indicated stenosis particularly in foramen of L5 with lateral recess to disk degeneration.  It is indicated for decompression of L5 and S1 nerve roots.  Risks and benefits were discussed including bleeding, infection, damage to neurovascular structures, no change in symptoms, worsening symptoms, need for repeat decompression, DVT, PE, anesthetic complications, need for a fusion in the future.  She was also instructed that this would not address any back pain, but predominantly buttock and leg pain.  Back pain will require subsequent fusion.  If necessary, this was the last invasive approach technique.  The patient was morbidly obese and the technical difficulty was increased due to the patient's size.  Her BMET was noted to be into the morbid obesity category that required  additional multiple layer closures and added to the degree of difficulty.  TECHNIQUE:  The patient was in supine position after induction of adequate general anesthesia and 2 g Kefzol.  She was placed prone on the Irwin frame.  All bony prominences were well padded.  Lumbar region was prepped and draped in the usual sterile fashion.  Two 18-gauge spinal needles were utilized to localize the L5-S1 interspace, confirmed with x-ray.  Incision was made from the spinous process of L5 to S1. Subcutaneous tissue __________ hemostasis.  Dorsolumbar fascia identified right down the skin incision.  Paraspinous muscle was elevated from L5 and S1.  McCullough retractor was placed.  Confirmatory radiograph obtained.  Operating microscope was draped and brought in the surgical field.  The patient had severe facet hypertrophy, and in fact no interlaminar window noted on studies and an osteotome therefore to remove the inferior process of L5.  This was then retrieved with a 3-mm Kerrison, identifying the superior articulating process of S1. Following this, we have used a 2 mm Kerrison, performed a foraminotomy of S1, severe stenosis of S1 as well as L5 and removing the remaining portion of the superior articulating process.  This was compressing the L5 root into the foramen.  This was then removed.  Ligamentum flavum removed from the interspace.  Hypertrophic venous plexus was noted. This was cauterized,  this was hardened, but not a ruptured disk.  I performed a foraminotomy of L5 and of S1.  I still felt there was compression of the L5 root.  Therefore, we performed a __________ hemilaminectomy of L5 on that left side, removing the lamina of L5 and removing the ligamentum flavum from the interspace at L4-5 and decompressing the lateral recess.  Following this, a hockey-stick probe passed freely up the foramen of L5 and of S1.  There was good excursion of the S1 nerve root __________ pedicle without  difficulty.  At least 1 cm, then taken confirmatory radiograph with a hockey-stick in the foramen of L5-S1 and at the disk space of L4 and L5.  This was confirmed by x-ray.  Inspection revealed no evidence of CSF leakage or active bleeding.  Felt this was mainly bony compression due to facet hypertrophy and disk degeneration; however, __________ decompression there was satisfactory decompression of L5 and S1 nerve roots.  They were erythematous and edematous, but intact.  After copious irrigation with antibiotic irrigation, inspection revealed no CSF leakage or active bleeding.  I, therefore removed McCullough retractor.  Paraspinous muscles __________ active bleeding.  Closed the paraspinous fascia with #1 Vicryl figure-of-eight sutures and subcu with 2-0 Vicryl simple sutures.  Skin was reapproximated with 4-0 subcuticular Prolene.  Wound reinforced with Steri-Strips.  Sterile dressing applied.  Placed supine on the hospital bed, extubated without difficulty, and transported to the recovery room in satisfactory condition.  The patient tolerated the procedure well.  No complications.  Assistant was [employer].  BLOOD LOSS:  Minimal.     Jene Every, M.D.     JB/MEDQ  D:  05/31/2011  T:  05/31/2011  Job:  [id number]

## 2011-05-31 NOTE — Preoperative (Signed)
Beta Blockers   Reason not to administer Beta Blockers:Not Applicable 

## 2011-05-31 NOTE — H&P (Signed)
Doris Smith is an 60 y.o. female.   Chief Complaint: left leg pain HPI: 60 yo with HNP L5S1 stenosis left refractory to conservative treatment.  Past Medical History  Diagnosis Date  . PONV (postoperative nausea and vomiting)   . Asthma 04/07/2011    dx  . Shortness of breath     with rest and exertion at times  . Sleep apnea 04/2011     dx  . GERD (gastroesophageal reflux disease)   . Headache     migraines  . Anxiety     takes Ativan  . Mental disorder     dx bipolar  . Depression   . Fracture, ankle 10/2008    right and left    Past Surgical History  Procedure Date  . Tonsillectomy     as child  . Abdominal hysterectomy 1982    partial hysterectomy  . Dilation and curettage of uterus 1981    abnormal pap  . Arthroscopic  knee left 2007    2 times  . Joint replacement 12/18/2005    left knee    Family History  Problem Relation Age of Onset  . Anesthesia problems Mother   . Anesthesia problems Sister    Social History:  does not have a smoking history on file. She does not have any smokeless tobacco history on file. She reports that she does not drink alcohol or use illicit drugs.  Allergies:  Allergies  Allergen Reactions  . Codeine Other (See Comments)    Makes feel like she is wild   . Sulfonamide Derivatives Other (See Comments)    Upset stomach    Medications Prior to Admission  Medication Dose Route Frequency Provider Last Rate Last Dose  . ceFAZolin (ANCEF) IVPB 2 g/50 mL premix  2 g Intravenous 60 min Pre-Op Javier Docker      . lactated ringers infusion   Intravenous Continuous Javier Docker       No current outpatient prescriptions on file as of 05/31/2011.    No results found for this or any previous visit (from the past 48 hour(s)). No results found.  Review of Systems  Constitutional: Negative.   HENT: Negative.   Eyes: Negative.   Respiratory: Negative.   Cardiovascular: Negative.   Gastrointestinal: Negative.     Genitourinary: Negative.   Musculoskeletal: Positive for back pain.  Skin: Negative.   Neurological: Positive for sensory change and focal weakness.  Endo/Heme/Allergies: Negative.   Psychiatric/Behavioral: Negative.     Blood pressure 131/66, pulse 75, temperature 97.8 F (36.6 C), temperature source Oral, resp. rate 18, SpO2 100.00%. Physical Exam  Constitutional: She is oriented to person, place, and time. She appears well-developed.  HENT:  Head: Normocephalic.  Eyes: Pupils are equal, round, and reactive to light.  Neck: Normal range of motion. Neck supple.  Cardiovascular: Normal rate and regular rhythm.   Respiratory: Effort normal.  GI: Soft. Bowel sounds are normal.  Musculoskeletal:       Positive straight leg left. Decreased sensation L5 dermatome. EHL 5-/5 left.  Neurological: She is alert and oriented to person, place, and time.  Skin: Skin is warm and dry.  Psychiatric: She has a normal mood and affect.     Assessment/Plan Stenosis L5S1 left. Plan decompression L5S1. Risks and benefits discussed.  Karlie Aung C 05/31/2011, 7:40 AM

## 2011-05-31 NOTE — Anesthesia Preprocedure Evaluation (Addendum)
Anesthesia Evaluation  Patient identified by MRN, date of birth, ID band Patient awake    Reviewed: Allergy & Precautions, H&P , NPO status , Patient's Chart, lab work & pertinent test results  History of Anesthesia Complications (+) PONV and Family history of anesthesia reaction  Airway Mallampati: III    Mouth opening: Limited Mouth Opening Comment: Overbite with narrow oral radius Dental  (+) Teeth Intact and Caps,    Pulmonary shortness of breath, asthma , sleep apnea and Continuous Positive Airway Pressure Ventilation ,  clear to auscultation  Pulmonary exam normal  + stridor     Cardiovascular hypertension, Pt. on medications Regular Normal    Neuro/Psych  Headaches, PSYCHIATRIC DISORDERS Anxiety Depression Bipolar Disorder Negative Psych ROS   GI/Hepatic Neg liver ROS, GERD-  Medicated,  Endo/Other  Negative Endocrine ROS  Renal/GU negative Renal ROS  Genitourinary negative   Musculoskeletal negative musculoskeletal ROS (+)   Abdominal   Peds negative pediatric ROS (+)  Hematology negative hematology ROS (+)   Anesthesia Other Findings   Reproductive/Obstetrics negative OB ROS                          Anesthesia Physical Anesthesia Plan  ASA: III  Anesthesia Plan: General   Post-op Pain Management:    Induction: Intravenous  Airway Management Planned: Oral ETT  Additional Equipment:   Intra-op Plan:   Post-operative Plan: Extubation in OR  Informed Consent: I have reviewed the patients History and Physical, chart, labs and discussed the procedure including the risks, benefits and alternatives for the proposed anesthesia with the patient or authorized representative who has indicated his/her understanding and acceptance.   Dental advisory given  Plan Discussed with: CRNA  Anesthesia Plan Comments:         Anesthesia Quick Evaluation

## 2011-05-31 NOTE — Transfer of Care (Signed)
Immediate Anesthesia Transfer of Care Note  Patient: Doris Smith  Procedure(s) Performed:  LUMBAR LAMINECTOMY/DECOMPRESSION MICRODISCECTOMY - Decompression Lumbar four to five and  Lumbar five to Sacral one on Left  (X-Ray)  Patient Location: PACU  Anesthesia Type: General  Level of Consciousness: awake, oriented, patient cooperative and responds to stimulation  Airway & Oxygen Therapy: Patient Spontanous Breathing and Patient connected to face mask  Post-op Assessment: Report given to PACU RN, Post -op Vital signs reviewed and stable and Patient moving all extremities  Post vital signs: Reviewed and stable  Complications: No apparent anesthesia complications

## 2011-05-31 NOTE — Anesthesia Postprocedure Evaluation (Signed)
Anesthesia Post Note  Patient: Doris Smith  Procedure(s) Performed:  LUMBAR LAMINECTOMY/DECOMPRESSION MICRODISCECTOMY - Decompression Lumbar four to five and  Lumbar five to Sacral one on Left  (X-Ray)  Anesthesia type: General  Patient location: PACU  Post pain: Pain level controlled  Post assessment: Post-op Vital signs reviewed  Last Vitals:  Filed Vitals:   05/31/11 1130  BP: 124/63  Pulse: 77  Temp: 36.6 C  Resp: 13    Post vital signs: Reviewed  Level of consciousness: sedated  Complications: No apparent anesthesia complications

## 2011-06-01 ENCOUNTER — Encounter (HOSPITAL_COMMUNITY): Payer: Self-pay | Admitting: Nurse Practitioner

## 2011-06-01 NOTE — Progress Notes (Signed)
Pt was given discharge instructions with husband present and her husband was able to assist and walk through the steps of her dressing changes.  Currently, pain was at a 4/10 and she was given a prescription of which she understood the medications purpose and where to pick it up.  IV was removed with tip intact and patient was transported to the front of the hospital via wheelchair.

## 2011-06-01 NOTE — Progress Notes (Signed)
Occupational Therapy Evaluation Patient Details Name: Doris Smith MRN: [id number] DOB: 02/20/1951 Today's Date: 06/01/2011 8:54-9:17 Co-session with PT  Problem List:  Patient Active Problem List  Diagnoses  . BIPOLAR DISORDER UNSPECIFIED  . HYPERTENSION  . ALLERGIC RHINITIS  . IRRITABLE BOWEL SYNDROME  . ARTHRITIS  . DEGENERATIVE DISC DISEASE, LUMBOSACRAL SPINE  . NAUSEA AND VOMITING    Past Medical History:  Past Medical History  Diagnosis Date  . PONV (postoperative nausea and vomiting)   . Asthma 04/07/2011    dx  . Shortness of breath     with rest and exertion at times  . Sleep apnea 04/2011     dx  . GERD (gastroesophageal reflux disease)   . Headache     migraines  . Anxiety     takes Ativan  . Mental disorder     dx bipolar  . Depression   . Fracture, ankle 10/2008    right and left   Past Surgical History:  Past Surgical History  Procedure Date  . Tonsillectomy     as child  . Abdominal hysterectomy 1982    partial hysterectomy  . Dilation and curettage of uterus 1981    abnormal pap  . Arthroscopic  knee left 2007    2 times  . Joint replacement 12/18/2005    left knee    OT Assessment/Plan/Recommendation OT Assessment Clinical Impression Statement: This 60 yo WF s/p LUMBAR LAMINECTOMY/DECOMPRESSION MICRODISCECTOMY thus affecting pt's PLOf at I/Mod I level. All education completed, no further OT needs, will sign off. OT Recommendation/Assessment: Patient does not need any further OT services OT Goals    OT Evaluation Precautions/Restrictions  Precautions Precautions: Back;Fall Required Braces or Orthoses: No Restrictions Weight Bearing Restrictions: No Prior Functioning Home Living Lives With: Spouse Receives Help From: Family;Friend(s) Type of Home: House Home Layout: One level Home Access: Ramped entrance;Stairs to enter;Other (comment) (Ramp at Mom's; steps at hers) Bathroom Shower/Tub: Tub/shower unit;Walk-in shower;Other  (comment) (Walk in at Mom's; tub/shower at hers) Bathroom Toilet: Handicapped height Bathroom Accessibility: Yes How Accessible: Accessible via walker Home Adaptive Equipment: Bedside commode/3-in-1;Walker - rolling;Straight cane Prior Function Level of Independence: Requires assistive device for independence;Independent with basic ADLs;Other (comment) (Used a SPC for ambulation at times) Driving:  (did not ask) Vocation:  (did not ask) ADL ADL Eating/Feeding: Simulated;Independent Where Assessed - Eating/Feeding: Chair Grooming: Performed;Wash/dry hands;Independent Where Assessed - Grooming: Standing at sink Upper Body Bathing: Simulated;Set up Upper Body Bathing Details (indicate cue type and reason): Pt moving slowly easier for someone to set her up Where Assessed - Upper Body Bathing: Sitting, chair;Supported Lower Body Bathing: Simulated;Minimal assistance Lower Body Bathing Details (indicate cue type and reason): Pt moving slowly easier for someone to set her up and needs A for feet Where Assessed - Lower Body Bathing: Sit to stand from chair;Supported Location manager Dressing: Simulated;Set up Upper Body Dressing Details (indicate cue type and reason): Pt moving slowly easier for someone to set her up  Where Assessed - Upper Body Dressing: Sitting, chair;Supported Lower Body Dressing: Simulated;Minimal assistance Lower Body Dressing Details (indicate cue type and reason): Pt moving slowly easier for someone to set her up and needs A for socks/shoes Where Assessed - Lower Body Dressing: Unsupported;Sit to stand from chair Toilet Transfer: Performed;Other (comment) (Mon guard A for balance since said she was dizzy earlier) Toilet Transfer Method: Proofreader: Raised toilet seat with arms (or 3-in-1 over toilet) Toileting - Clothing Manipulation: Modified independent Where Assessed -  Toileting Clothing Manipulation: Sit to stand from 3-in-1 or toilet  (3-n-1) Toileting - Hygiene: Performed;Independent Where Assessed - Toileting Hygiene: Sit on 3-in-1 or toilet Tub/Shower Transfer: Not assessed Tub/Shower Transfer Method: Not assessed Equipment Used: Other (comment) (3-n-1) Vision/Perception  Vision - History Baseline Vision: No visual deficits Cognition Cognition Arousal/Alertness: Awake/alert Overall Cognitive Status: Appears within functional limits for tasks assessed Orientation Level: Oriented X4 Sensation/Coordination Sensation Light Touch: Impaired Detail Light Touch Impaired Details: Impaired LLE Stereognosis: Not tested Hot/Cold: Not tested Proprioception: Not tested Coordination Gross Motor Movements are Fluid and Coordinated: Yes Fine Motor Movements are Fluid and Coordinated: Yes Extremity Assessment RUE Assessment RUE Assessment: Within Functional Limits LUE Assessment LUE Assessment: Within Functional Limits Mobility  Bed Mobility Bed Mobility: No Transfers Transfers: Yes Sit to Stand: 4: Min assist;From chair/3-in-1;With upper extremity assist;With armrests Sit to Stand Details (indicate cue type and reason): Limited by pain but stands up slowly.   Stand to Sit: 4: Min assist;To chair/3-in-1;With armrests;With upper extremity assist Stand to Sit Details: Sits safely with cues for hand placement Exercises   End of Session OT - End of Session Equipment Utilized During Treatment: Gait belt Activity Tolerance: Patient tolerated treatment well Patient left: in chair;with call bell in reach General Behavior During Session: [facility] for tasks performed Cognition: [facility] for tasks performed   Evette Georges, OTR/L [phone] 06/01/2011, 9:32 AM

## 2011-06-01 NOTE — Discharge Summary (Signed)
Physician Discharge Summary  Patient ID: SIERRA SPARGO MRN: [id number] DOB/AGE: [birth year] 60 y.o.  Admit date: 05/31/2011 Discharge date: 06/01/2011  Admission Diagnoses:  Discharge Diagnoses:  Active Problems:  * No active hospital problems. *    Discharged Condition: good  Hospital Course:   Consults: none  Significant Diagnostic Studies:   Treatments:  Discharge Exam: Blood pressure 108/43, pulse 81, temperature 98.2 F (36.8 C), temperature source Oral, resp. rate 16, height 5\' 4"  (1.626 m), weight 88.905 kg (196 lb), SpO2 98.00%.   Disposition:  Home with Rx  Current Discharge Medication List    START taking these medications   Details  HYDROcodone-acetaminophen (NORCO) 5-325 MG per tablet Take 1-2 tablets by mouth every 4 (four) hours as needed for pain. Qty: 60 tablet, Refills: 1      CONTINUE these medications which have NOT CHANGED   Details  acetaminophen (TYLENOL) 500 MG tablet Take 500 mg by mouth every 6 (six) hours as needed. Pain     albuterol (PROVENTIL HFA;VENTOLIN HFA) 108 (90 BASE) MCG/ACT inhaler Inhale 2 puffs into the lungs every 4 (four) hours as needed. Wheezing      busPIRone (BUSPAR) 10 MG tablet Take 10 mg by mouth 3 (three) times daily.     ciclesonide (OMNARIS) 50 MCG/ACT nasal spray Place 1 spray into both nostrils daily.     DULoxetine (CYMBALTA) 60 MG capsule Take 60 mg by mouth at bedtime.     fluticasone (VERAMYST) 27.5 MCG/SPRAY nasal spray Place 1 spray into the nose 2 (two) times daily.      hyoscyamine (ANASPAZ) 0.125 MG TBDP Place 0.125 mg under the tongue 2 (two) times daily.     lamoTRIgine (LAMICTAL) 100 MG tablet Take 100 mg by mouth at bedtime.     LORazepam (ATIVAN) 0.5 MG tablet Take 0.5 mg by mouth every 8 (eight) hours as needed.      mometasone-formoterol (DULERA) 100-5 MCG/ACT AERO Inhale 2 puffs into the lungs 2 (two) times daily.     omeprazole (PRILOSEC) 40 MG capsule Take 40 mg by mouth daily.      Phenylephrine-Aspirin (ALKA-SELTZER PLUS SINUS PO) Take 1 tablet by mouth 2 (two) times daily as needed. Congestion     loperamide (IMODIUM) 2 MG capsule Take 2 mg by mouth 4 (four) times daily as needed. diarrhea        Follow-up Information    Follow up with BEANE,JEFFREY C. Make an appointment in 14 days.   Contact information:   [facility] [address] [place] [address] [phone]          Signed: Drucilla Schmidt 06/01/2011, 9:27 AM

## 2011-06-01 NOTE — Progress Notes (Addendum)
Physical Therapy Evaluation Patient Details Name: Doris Smith MRN: [id number] DOB: 1950-12-02 Today's Date: 06/01/2011  Problem List:  Patient Active Problem List  Diagnoses  . BIPOLAR DISORDER UNSPECIFIED  . HYPERTENSION  . ALLERGIC RHINITIS  . IRRITABLE BOWEL SYNDROME  . ARTHRITIS  . DEGENERATIVE DISC DISEASE, LUMBOSACRAL SPINE  . NAUSEA AND VOMITING    Past Medical History:  Past Medical History  Diagnosis Date  . PONV (postoperative nausea and vomiting)   . Asthma 04/07/2011    dx  . Shortness of breath     with rest and exertion at times  . Sleep apnea 04/2011     dx  . GERD (gastroesophageal reflux disease)   . Headache     migraines  . Anxiety     takes Ativan  . Mental disorder     dx bipolar  . Depression   . Fracture, ankle 10/2008    right and left   Past Surgical History:  Past Surgical History  Procedure Date  . Tonsillectomy     as child  . Abdominal hysterectomy 1982    partial hysterectomy  . Dilation and curettage of uterus 1981    abnormal pap  . Arthroscopic  knee left 2007    2 times  . Joint replacement 12/18/2005    left knee    PT Assessment/Plan/Recommendation PT Assessment Clinical Impression Statement: Patient is s/p back surgery with deficits due to pain and decreased endurance post op.  Patient states that her pain is actually better overall than before the surgery and hopes that recovery continues to improve.  Patient will need HHPT f/u with family support.   PT Recommendation/Assessment: Patient will need skilled PT in the acute care venue PT Problem List: Decreased activity tolerance;Decreased balance;Decreased mobility;Decreased knowledge of use of DME;Decreased safety awareness;Decreased knowledge of precautions;Pain PT Therapy Diagnosis : Difficulty walking;Acute pain PT Plan PT Frequency: Min 5X/week PT Treatment/Interventions: DME instruction;Gait training;Stair training;Functional mobility training;Therapeutic  exercise;Balance training;Patient/family education PT Recommendation Follow Up Recommendations: Home health PT;24 hour supervision/assistance Equipment Recommended: None recommended by PT PT Goals  Acute Rehab PT Goals PT Goal Formulation: With patient Time For Goal Achievement: 7 days Pt will go Sit to Supine/Side: with supervision Pt will Transfer Sit to Stand/Stand to Sit: with supervision PT Transfer Goal: Sit to Stand/Stand to Sit - Progress: Other (comment) Pt will Ambulate: >150 feet;with modified independence;with least restrictive assistive device PT Goal: Ambulate - Progress: Other (comment) Pt will Go Up / Down Stairs: 3-5 stairs;with least restrictive assistive device;with rail(s) PT Goal: Up/Down Stairs - Progress: Other (comment) Additional Goals Additional Goal #1: Pt. to follow 3/3 back precautions with all mobility. PT Goal: Additional Goal #1 - Progress: Other (comment)  PT Evaluation Precautions/Restrictions  Precautions Precautions: Back;Fall Required Braces or Orthoses: No Restrictions Weight Bearing Restrictions: No Prior Functioning  Home Living Lives With: Spouse Receives Help From: Family;Friend(s) Type of Home: House Home Layout: One level Home Access: Ramped entrance;Stairs to enter;Other (comment) (Ramp at Mom's; steps at hers) Bathroom Shower/Tub: Tub/shower unit;Walk-in shower;Other (comment) (Walk in at Mom's; tub/shower at hers) Bathroom Toilet: Handicapped height Bathroom Accessibility: Yes How Accessible: Accessible via walker Home Adaptive Equipment: Bedside commode/3-in-1;Walker - rolling;Straight cane Prior Function Level of Independence: Requires assistive device for independence;Independent with basic ADLs;Other (comment) (Used a SPC for ambulation at times) Driving:  (did not ask) Vocation:  (did not ask) Cognition Cognition Arousal/Alertness: Awake/alert Overall Cognitive Status: Appears within functional limits for tasks  assessed Orientation Level: Oriented X4  Sensation/Coordination Sensation Light Touch: Impaired Detail Light Touch Impaired Details: Impaired LLE Stereognosis: Not tested Hot/Cold: Not tested Proprioception: Not tested Coordination Gross Motor Movements are Fluid and Coordinated: Yes Fine Motor Movements are Fluid and Coordinated: Yes Extremity Assessment RUE Assessment RUE Assessment: Within Functional Limits LUE Assessment LUE Assessment: Within Functional Limits RLE Assessment RLE Assessment: Within Functional Limits LLE Assessment LLE Assessment: Within Functional Limits Mobility (including Balance) Bed Mobility Bed Mobility: No Transfers Transfers: Yes Sit to Stand: 4: Min assist;From chair/3-in-1;With upper extremity assist;With armrests Sit to Stand Details (indicate cue type and reason): Limited by pain but stands up slowly.   Stand to Sit: 4: Min assist;To chair/3-in-1;With armrests;With upper extremity assist Stand to Sit Details: Sits safely with cues for hand placement Ambulation/Gait Ambulation/Gait: Yes Ambulation/Gait Assistance: 4: Min assist Ambulation/Gait Assistance Details (indicate cue type and reason): Pt. ambulated initially without handheld assist with slight unsteady gait.  Once, pt. went up and down stairs, began to have greater LOB secondary to left hip pain per pt. and needed handheld assist of 1.  Pt. staggered a few times throughout rest of walk.   Ambulation Distance (Feet): 400 Feet Assistive device: 1 person hand held assist Gait Pattern: Step-to pattern;Decreased stride length;Decreased step length - right;Decreased stance time - right;Antalgic;Lateral hip instability Stairs: Yes Stairs Assistance: 4: Min assist Stairs Assistance Details (indicate cue type and reason): Pt. needed cues for technique for foot placement Stair Management Technique: One rail Right;Forwards Number of Stairs: 3  Height of Stairs: 5  Wheelchair Mobility Wheelchair  Mobility: No  Posture/Postural Control Posture/Postural Control: No significant limitations Balance Balance Assessed: No Exercise    End of Session PT - End of Session Equipment Utilized During Treatment: Gait belt Activity Tolerance: Patient limited by pain Patient left: in chair;with call bell in reach Nurse Communication: Mobility status for ambulation General Behavior During Session: [facility] for tasks performed Cognition: [facility] for tasks performed  INGOLD,Lakasha Mcfall 06/01/2011, 9:35 AM Co-rx. With OT [facility] [phone] (pager)

## 2011-06-05 ENCOUNTER — Encounter (HOSPITAL_COMMUNITY): Payer: Self-pay | Admitting: Specialist

## 2012-09-20 ENCOUNTER — Encounter: Payer: Self-pay | Admitting: *Deleted

## 2012-09-28 ENCOUNTER — Other Ambulatory Visit: Payer: Self-pay | Admitting: Nurse Practitioner

## 2012-09-30 ENCOUNTER — Telehealth: Payer: Self-pay | Admitting: Internal Medicine

## 2012-09-30 NOTE — Telephone Encounter (Signed)
Patient states her PCP put her on Hyoscyamine BID. She is still having bloating and abdominal pain. Hx. IBS. Scheduled with Doug Sou, PA on 10/03/12 at 10:45 AM per patient request.

## 2012-10-03 ENCOUNTER — Ambulatory Visit (INDEPENDENT_AMBULATORY_CARE_PROVIDER_SITE_OTHER): Payer: BC Managed Care – PPO | Admitting: Gastroenterology

## 2012-10-03 ENCOUNTER — Other Ambulatory Visit (INDEPENDENT_AMBULATORY_CARE_PROVIDER_SITE_OTHER): Payer: BC Managed Care – PPO

## 2012-10-03 ENCOUNTER — Encounter: Payer: Self-pay | Admitting: Gastroenterology

## 2012-10-03 VITALS — BP 104/66 | HR 72 | Ht 64.5 in | Wt 171.6 lb

## 2012-10-03 DIAGNOSIS — R109 Unspecified abdominal pain: Secondary | ICD-10-CM | POA: Insufficient documentation

## 2012-10-03 DIAGNOSIS — K589 Irritable bowel syndrome without diarrhea: Secondary | ICD-10-CM

## 2012-10-03 LAB — IGA: IgA: 214 mg/dL (ref 68–378)

## 2012-10-03 MED ORDER — DICYCLOMINE HCL 10 MG PO CAPS: 10.0000 mg | ORAL_CAPSULE | Freq: Three times a day (TID) | ORAL | Status: DC

## 2012-10-03 NOTE — Progress Notes (Signed)
Reviewed and agree, abd. Pain likely functional. May need CT abd to r/o other etiologies,

## 2012-10-03 NOTE — Progress Notes (Deleted)
10/03/2012 KINDEL ROCHEFORT [id number] [birth year]   History of Present Illness:  ***  Current Medications, Allergies, Past Medical History, Past Surgical History, Family History and Social History were reviewed in [facility] electronic medical record.   Physical Exam: BP 104/66  Pulse 72  Ht 5' 4.5" (1.638 m)  Wt 171 lb 9.6 oz (77.837 kg)  BMI 29.01 kg/m2 General: Well developed, white female in no acute distress Head: Normocephalic and atraumatic Eyes:  sclerae anicteric, conjunctiva pink  Ears: Normal auditory acuity Lungs: Clear throughout to auscultation Heart: Regular rate and rhythm Abdomen: Soft, non-distended. No masses, no hepatomegaly. Normal bowel sounds.  Diffuse mild TTP without R/R/G. Musculoskeletal: Symmetrical with no gross deformities  Extremities: No edema  Neurological: Alert oriented x 4, grossly nonfocal Psychological:  Alert and cooperative.  Tearful, anxious.  Assessment and Recommendations: -Abdominal pain with gas/bloating and alternating BM's:  Very likely secondary to IBS.  She has a lot of psychiatric issues.

## 2012-10-03 NOTE — Progress Notes (Signed)
10/03/2012 Doris Smith [id number] Sep 27, 1950   HISTORY OF PRESENT ILLNESS:  This is a 62 year old female who has long-standing diagnosis of IBS and anxiety/depression.  She was seen in 2009 by Dr. Juanda Chance for complaints of abdominal pain, nausea, vomiting, etc.  She underwent an EGD, which was normal.  Colonoscopy at that time was normal as well; random biopsies were negative for microscopic colitis.  She had been evaluated for gallbladder disease with ultrasound and HIDA scan, which were normal.  Dr. Juanda Chance recommended changes in her psychotropic medications.  She is here today at the request of her PCP for complaints of diffuse abdominal pain/soreness, gas, bloating, and alternating constipation and loose stools.  Says that sometimes her stools are a yellowish brown color.  No dark or bloody stools.  At the first notice that she might have some loose stools then she will take Imodium because she says that she cannot deal with having diarrhea; says that she does not use the Imodium often.  She says that she is under a lot of stress and her nerves are shot; she and her husband have been living with her parents for the past two years so that they can help care for her mother.  She is on several psychiatric medications and is seeing her psychiatrist today as well.  Is tearful and crying during her appointment today.  Says that even water makes her bloated.  Her PCP gave her levsin to take and she has been using it BID but it does not seem to be doing anything.  She has lost 47 pounds since she was last seen here 4.5 years ago.  Still has nausea and vomiting at times as well and says that she was told this was because of her "nervous stomach".   Past Medical History  Diagnosis Date  . PONV (postoperative nausea and vomiting)   . Asthma 04/07/2011    dx  . Shortness of breath     with rest and exertion at times  . Sleep apnea 04/2011     dx  . GERD (gastroesophageal reflux disease)   . Headache    migraines  . Anxiety     takes Ativan  . Mental disorder     dx bipolar  . Depression   . Fracture, ankle 10/2008    right and left  . Irritable bowel syndrome   . Fatty liver    Past Surgical History  Procedure Laterality Date  . Tonsillectomy      as child  . Partial hysterectomy  1982  . Dilation and curettage of uterus  1981    abnormal pap  . Knee arthroscopy Left 2007    x 2  . Total knee arthroplasty Left 12/18/2005  . Lumbar laminectomy/decompression microdiscectomy  05/31/2011    Procedure: LUMBAR LAMINECTOMY/DECOMPRESSION MICRODISCECTOMY;  Surgeon: Javier Docker;  Location: [facility];  Service: Orthopedics;  Laterality: Left;  Decompression Lumbar four to five and  Lumbar five to Sacral one on Left  (X-Ray)    reports that she has never smoked. She does not have any smokeless tobacco history on file. She reports that she does not drink alcohol or use illicit drugs. family history includes Anesthesia problems in her mother and sister; Colon polyps in her father; and Heart disease in her father. Allergies  Allergen Reactions  . Codeine Other (See Comments)    Makes feel like she is wild   . Sulfonamide Derivatives Other (See Comments)    Upset  stomach      Outpatient Encounter Prescriptions as of 10/03/2012  Medication Sig Dispense Refill  . acetaminophen (TYLENOL) 500 MG tablet Take 500 mg by mouth every 6 (six) hours as needed. Pain       . busPIRone (BUSPAR) 10 MG tablet Take 10 mg by mouth 3 (three) times daily.       . ciclesonide (OMNARIS) 50 MCG/ACT nasal spray Place 1 spray into both nostrils daily.       . DULoxetine (CYMBALTA) 60 MG capsule Take 60 mg by mouth at bedtime.       . fluticasone (VERAMYST) 27.5 MCG/SPRAY nasal spray Place 1 spray into the nose 2 (two) times daily.        . hyoscyamine (ANASPAZ) 0.125 MG TBDP Place 0.125 mg under the tongue 2 (two) times daily.       . hyoscyamine (LEVSIN SL) 0.125 MG SL tablet DISSOLVE ONE TABLET UNDER TONGUE  TWICE DAILY  60 tablet  1  . lamoTRIgine (LAMICTAL) 100 MG tablet Take 100 mg by mouth at bedtime.       Marland Kitchen loperamide (IMODIUM) 2 MG capsule Take 2 mg by mouth 4 (four) times daily as needed. diarrhea       . LORazepam (ATIVAN) 0.5 MG tablet Take 0.5 mg by mouth every 8 (eight) hours as needed.        . methocarbamol (ROBAXIN) 500 MG tablet Take 500 mg by mouth as needed.      Marland Kitchen omeprazole (PRILOSEC) 40 MG capsule Take 40 mg by mouth daily.       Marland Kitchen Phenylephrine-Aspirin (ALKA-SELTZER PLUS SINUS PO) Take 1 tablet by mouth 2 (two) times daily as needed. Congestion       . dicyclomine (BENTYL) 10 MG capsule Take 1 capsule (10 mg total) by mouth 4 (four) times daily -  before meals and at bedtime.  90 capsule  1  . [DISCONTINUED] albuterol (PROVENTIL HFA;VENTOLIN HFA) 108 (90 BASE) MCG/ACT inhaler Inhale 2 puffs into the lungs every 4 (four) hours as needed. Wheezing        . [DISCONTINUED] dicyclomine (BENTYL) 10 MG capsule Take 1 capsule (10 mg total) by mouth 4 (four) times daily -  before meals and at bedtime.  90 capsule  1  . [DISCONTINUED] mometasone-formoterol (DULERA) 100-5 MCG/ACT AERO Inhale 2 puffs into the lungs 2 (two) times daily.        No facility-administered encounter medications on file as of 10/03/2012.     REVIEW OF SYSTEMS:   All other systems reviewed and negative except where noted in the History of Present Illness.   PHYSICAL EXAM: BP 104/66  Pulse 72  Ht 5' 4.5" (1.638 m)  Wt 171 lb 9.6 oz (77.837 kg)  BMI 29.01 kg/m2 General: Well developed white female in no acute distress Head: Normocephalic and atraumatic Eyes:  sclerae anicteric, conjunctive pink. Ears: Normal auditory acuity Lungs: Clear throughout to auscultation Heart: Regular rate and rhythm Abdomen: Soft, non-distended. No masses or hepatomegaly noted. Normal bowel sounds.  Diffuse mild TTP without R/R/G. Musculoskeletal: Symmetrical with no gross deformities  Skin: No lesions on visible  extremities Extremities: No edema  Neurological: Alert oriented x 4, grossly nonfocal Psychological:  Alert and cooperative.  Tearful, anxious.  ASSESSMENT AND PLAN: -Diffuse abdominal pain with gas/bloating and alternating BM's:  Very likely secondary to IBS.  She has a lot of psychiatric issues, which play a huge role in her symptomatology.  She is seeing her psychiatrist today.  We will discontinue her Levsin and give her Bentyl 10 mg TID to start.  She is willing to try any dietary measures, so I discussed both a gluten free diet with her (and possible gluten sensitivity) as well as the FODMAP diet.  Will check celiac labs today as well.  She will follow-up in several weeks with Dr. Juanda Chance.

## 2012-10-03 NOTE — Patient Instructions (Addendum)
Your physician has requested that you go to the basement for the following lab work before leaving today:  TTG, IGA - Celiac  We have given you the following medications to take your pharmacy :  Doris Smith has suggested you follow the FODMAP diet and the celiac diet.  Follow up with Dr. Juanda Chance in 4-6 weeks

## 2012-10-04 LAB — TISSUE TRANSGLUTAMINASE, IGA: Tissue Transglutaminase Ab, IgA: 3.8 U/mL (ref <20)

## 2012-10-09 ENCOUNTER — Ambulatory Visit: Payer: BC Managed Care – PPO | Admitting: Internal Medicine

## 2012-11-05 ENCOUNTER — Encounter: Payer: Self-pay | Admitting: Internal Medicine

## 2012-11-05 ENCOUNTER — Ambulatory Visit (INDEPENDENT_AMBULATORY_CARE_PROVIDER_SITE_OTHER): Payer: BC Managed Care – PPO | Admitting: Internal Medicine

## 2012-11-05 VITALS — BP 110/70 | HR 86 | Ht 64.5 in | Wt 171.6 lb

## 2012-11-05 DIAGNOSIS — R198 Other specified symptoms and signs involving the digestive system and abdomen: Secondary | ICD-10-CM

## 2012-11-05 DIAGNOSIS — R141 Gas pain: Secondary | ICD-10-CM

## 2012-11-05 DIAGNOSIS — R634 Abnormal weight loss: Secondary | ICD-10-CM

## 2012-11-05 DIAGNOSIS — R194 Change in bowel habit: Secondary | ICD-10-CM

## 2012-11-05 DIAGNOSIS — R140 Abdominal distension (gaseous): Secondary | ICD-10-CM

## 2012-11-05 NOTE — Patient Instructions (Addendum)
You have been scheduled for a CT scan of the abdomen and pelvis at [facility] ([address]---this is in the same building as [facility]).   You are scheduled on Thursday, 11/07/12 at 9:30 am. You should arrive 15 minutes prior to your appointment time for registration. Please follow the written instructions below on the day of your exam:  WARNING: IF YOU ARE ALLERGIC TO IODINE/X-RAY DYE, PLEASE NOTIFY RADIOLOGY IMMEDIATELY AT [phone]! YOU WILL BE GIVEN A 13 HOUR PREMEDICATION PREP.  1) Do not eat or drink anything after 5:30 am (4 hours prior to your test) 2) You have been given 2 bottles of oral contrast to drink. The solution may taste better if refrigerated, but do NOT add ice or any other liquid to this solution. Shake well before drinking.    Drink 1 bottle of contrast @ 7:30 am (2 hours prior to your exam)  Drink 1 bottle of contrast @ 8:30 am (1 hour prior to your exam)  You may take any medications as prescribed with a small amount of water except for the following: Metformin, Glucophage, Glucovance, Avandamet, Riomet, Fortamet, Actoplus Met, Janumet, Glumetza or Metaglip. The above medications must be held the day of the exam AND 48 hours after the exam.  The purpose of you drinking the oral contrast is to aid in the visualization of your intestinal tract. The contrast solution may cause some diarrhea. Before your exam is started, you will be given a small amount of fluid to drink. Depending on your individual set of symptoms, you may also receive an intravenous injection of x-ray contrast/dye. Plan on being at [facility] for 30 minutes or long, depending on the type of exam you are having performed.  This test typically takes 30-45 minutes to complete.  If you have any questions regarding your exam or if you need to reschedule, you may call the CT department at [phone] between the hours of 8:00 am and 5:00 pm,  Monday-Friday.  ________________________________________________________________________ CC: Dr Paulene Floor

## 2012-11-05 NOTE — Progress Notes (Signed)
Doris Smith 23-Apr-1951 MRN [id number]        History of Present Illness:  This is a 62 yo female with a anxiety and history of bipolar disorder. She has a history of irritable bowel syndrome which was evaluated extensively in 2009 with negative upper endoscopy, colonoscopy, upper abdominal ultrasound ,except for fatty liver and normal HIDA scan. She has had a weight loss from 218 pounds to currently 171 pounds. She is complaining of bloating, irregular bowel habits, incomplete evacuation and  food intolerance. She is very teary-eyed talking about the stress with her elderly parents. She has been followed by counselor Oneta Rack at [facility] counseling and has been given Ativan by Dr. Wynonia Lawman, Lamictal 100 mg at bedtime, Cymbalta 60 mg 2 times a day, BuSpar 10 mg 3 times a day. On recent visit with Jessica Zehr,NP. she was started on Bentyl 10 mg 4 times a day but takes it only twice a day. She has also been on gas reducing diet which seemed to have helped. Her sprue profile was negative. There is a positive family history of gallbladder disease in her mother   Past Medical History  Diagnosis Date  . PONV (postoperative nausea and vomiting)   . Asthma 04/07/2011    dx  . Shortness of breath     with rest and exertion at times  . Sleep apnea 04/2011     dx  . GERD (gastroesophageal reflux disease)   . Headache     migraines  . Anxiety     takes Ativan  . Mental disorder     dx bipolar  . Depression   . Fracture, ankle 10/2008    right and left  . Irritable bowel syndrome   . Fatty liver    Past Surgical History  Procedure Laterality Date  . Tonsillectomy      as child  . Partial hysterectomy  1982  . Dilation and curettage of uterus  1981    abnormal pap  . Knee arthroscopy Left 2007    x 2  . Total knee arthroplasty Left 12/18/2005  . Lumbar laminectomy/decompression microdiscectomy  05/31/2011    Procedure: LUMBAR LAMINECTOMY/DECOMPRESSION MICRODISCECTOMY;   Surgeon: Javier Docker;  Location: [facility];  Service: Orthopedics;  Laterality: Left;  Decompression Lumbar four to five and  Lumbar five to Sacral one on Left  (X-Ray)    reports that she has never smoked. She does not have any smokeless tobacco history on file. She reports that she does not drink alcohol or use illicit drugs. family history includes Anesthesia problems in her mother and sister; Colon polyps in her father; and Heart disease in her father. Allergies  Allergen Reactions  . Codeine Other (See Comments)    Makes feel like she is wild   . Sulfonamide Derivatives Other (See Comments)    Upset stomach  . Vitamin B12         Review of Systems:  The remainder of the 10 point ROS is negative except as outlined in H&P   Physical Exam: General appearance  Well developed, in no distress. Cries frequently during our interview Eyes- non icteric. HEENT nontraumatic, normocephalic. Mouth no lesions, tongue papillated, no cheilosis. Neck supple without adenopathy, thyroid not enlarged, no carotid bruits, no JVD. Lungs Clear to auscultation bilaterally. Cor normal S1, normal S2, regular rhythm, no murmur,  quiet precordium. Abdomen: Soft abdomen with normal active bowel sounds. Mild diffuse tenderness without rebound. No palpable mass. Liver edge at costal margin.  Rectal: Not repeated Extremities no pedal edema. Skin no lesions. Neurological alert and oriented x 3. Psychological normal mood and affect.  Assessment and Plan:  62 year old white female with irritable bowel syndrome and bipolar disorder. Having a lot of anxiety and stress. She is on multiple psychotropic medications followed by Dr Wynonia Lawman. She needs to take her Ativan more often for the abdominal distention and bloating in addition to  the Bentyl 10 mg twice a day. We will proceed with a CT scan of the abdomen and pelvis to look for  abnormalities such as ovarian cancer or space occupying lesion of the abdomen. We  will use IV and oral contrast. Depending on the findings on the CT scan she may need a repeat colonoscopy to further evaluate  change in the bowel habits specifically the difficult evacuation. She is having symptoms of urinary stress incontinence and may be developing a rectocele. She will continue on the gas reducing diet which seemed to be working   11/05/2012 Lina Sar

## 2012-11-06 ENCOUNTER — Telehealth: Payer: Self-pay | Admitting: Internal Medicine

## 2012-11-06 NOTE — Telephone Encounter (Signed)
I have spoken to Dr Juanda Chance. Dr Juanda Chance would like patient just to have a CT abdomen and pelvis WITHOUT contrast due to previous presumed allergic reaction. I have spoken to Catarina in [facility] who has changed the ct to a ct abdomen and pelvis without contrast. I have also advised patient that we will be giving her a CT scan without contrast due to her previous reaction.

## 2012-11-06 NOTE — Telephone Encounter (Signed)
Patient called today to advise that she thinks she had a reaction to CT dye many years ago and the CT exam was stopped by a physician when she complained of "arm and heart" pain during dye infusion. We were not made aware of this at the time of patient's office visit. I have added Iohexol to patient's allergy list and will speak to Dr Juanda Chance to find out if she would like CT without contrast or if she would like patient to have CT with IV dye completed at the hospital after a premed regimen is given.

## 2012-11-07 ENCOUNTER — Ambulatory Visit (INDEPENDENT_AMBULATORY_CARE_PROVIDER_SITE_OTHER)
Admission: RE | Admit: 2012-11-07 | Discharge: 2012-11-07 | Disposition: A | Discharge status: Home / Self Care | Payer: BC Managed Care – PPO | Source: Ambulatory Visit | Attending: Internal Medicine | Admitting: Internal Medicine

## 2012-11-07 DIAGNOSIS — R140 Abdominal distension (gaseous): Secondary | ICD-10-CM

## 2012-11-07 DIAGNOSIS — R143 Flatulence: Secondary | ICD-10-CM

## 2012-11-07 DIAGNOSIS — I4891 Unspecified atrial fibrillation: Secondary | ICD-10-CM

## 2012-11-07 DIAGNOSIS — R634 Abnormal weight loss: Secondary | ICD-10-CM

## 2012-11-07 DIAGNOSIS — R194 Change in bowel habit: Secondary | ICD-10-CM

## 2012-11-07 DIAGNOSIS — R141 Gas pain: Secondary | ICD-10-CM

## 2012-11-07 DIAGNOSIS — R198 Other specified symptoms and signs involving the digestive system and abdomen: Secondary | ICD-10-CM

## 2012-11-07 HISTORY — DX: Unspecified atrial fibrillation: I48.91

## 2012-11-14 ENCOUNTER — Ambulatory Visit: Payer: BC Managed Care – PPO | Admitting: Internal Medicine

## 2012-12-03 ENCOUNTER — Encounter: Payer: Self-pay | Admitting: Internal Medicine

## 2012-12-03 ENCOUNTER — Ambulatory Visit (INDEPENDENT_AMBULATORY_CARE_PROVIDER_SITE_OTHER): Payer: BC Managed Care – PPO | Admitting: Internal Medicine

## 2012-12-03 VITALS — BP 120/60 | HR 70 | Ht 64.5 in | Wt 168.8 lb

## 2012-12-03 DIAGNOSIS — K589 Irritable bowel syndrome without diarrhea: Secondary | ICD-10-CM

## 2012-12-03 MED ORDER — LINACLOTIDE 145 MCG PO CAPS: 145.0000 ug | ORAL_CAPSULE | Freq: Every day | ORAL | Status: DC

## 2012-12-03 NOTE — Progress Notes (Signed)
Doris Smith Apr 08, 1951 MRN [id number]        History of Present Illness:  This is a 62 -year-old white female with the anxiety and bipolar disorder and the abdominal symptoms of irritable bowel syndrome. Last appointment in April 2013, she has been tried on antispasmodics without improvement. CT scan of the abdomen showed increasing size of hiatal hernia. She takes omeprazole 40 mg daily. She had a full GI evaluation in 2009 which included normal upper endoscopy, colonoscopy, normal upper abdominal ultrasound and HIDA scan. She is on multiple psychotropic medications which include Lamictal, Cymbalta, BuSpar, Ativan 1 mg twice a day there has been gradualweight loss from initial 218 pounds to 171 pounds on last appointment and 168 pounds today. She has a lot of stress with her aging parents   Past Medical History  Diagnosis Date  . PONV (postoperative nausea and vomiting)   . Asthma 04/07/2011    dx  . Shortness of breath     with rest and exertion at times  . Sleep apnea 04/2011     dx  . GERD (gastroesophageal reflux disease)   . Headache     migraines  . Anxiety     takes Ativan  . Mental disorder     dx bipolar  . Depression   . Fracture, ankle 10/2008    right and left  . Irritable bowel syndrome   . Fatty liver   . A-fib 11/2012   Past Surgical History  Procedure Laterality Date  . Tonsillectomy      as child  . Partial hysterectomy  1982  . Dilation and curettage of uterus  1981    abnormal pap  . Knee arthroscopy Left 2007    x 2  . Total knee arthroplasty Left 12/18/2005  . Lumbar laminectomy/decompression microdiscectomy  05/31/2011    Procedure: LUMBAR LAMINECTOMY/DECOMPRESSION MICRODISCECTOMY;  Surgeon: Javier Docker;  Location: [facility];  Service: Orthopedics;  Laterality: Left;  Decompression Lumbar four to five and  Lumbar five to Sacral one on Left  (X-Ray)    reports that she has never smoked. She has never used smokeless tobacco. She reports that she  does not drink alcohol or use illicit drugs. family history includes Anesthesia problems in her mother and sister; Colon polyps in her father; and Heart disease in her father. Allergies  Allergen Reactions  . Iohexol     "over 20 years ago" had reaction "hurting in arm and heart"-Done in [place], [place].Marland Kitchenphysician stopped CT at that time.  . Codeine Other (See Comments)    Makes feel like she is wild   . Sulfonamide Derivatives Other (See Comments)    Upset stomach  . Vitamin B12         Review of Systems: Denies dysphagia chest pain rectal bleeding  The remainder of the 10 point ROS is negative except as outlined in H&P   Physical Exam: General appearance  Well developed, in no distress. Psychological normal mood and affect.  Assessment and Plan: 62 year old white female with irritable bowel syndrome with predominant diarrhea. We will add Linzess 145 mcg daily for IBS. Prescription sent to her pharmacy. I will see her in 6 weeks, she will continue all other medications   12/03/2012 Doris Smith

## 2012-12-03 NOTE — Patient Instructions (Addendum)
Start samples of Linzess one tablet by mouth once daily for constipation. A prescription has also been sent to your pharmacy.   Paulene Floor FNP, Dr Wynonia Lawman

## 2012-12-09 ENCOUNTER — Telehealth: Payer: Self-pay | Admitting: Internal Medicine

## 2012-12-09 DIAGNOSIS — K589 Irritable bowel syndrome without diarrhea: Secondary | ICD-10-CM

## 2012-12-09 DIAGNOSIS — R109 Unspecified abdominal pain: Secondary | ICD-10-CM

## 2012-12-09 NOTE — Telephone Encounter (Signed)
Left message for patient to call back  

## 2012-12-09 NOTE — Telephone Encounter (Signed)
OK to go back on Bentyl, please , increase it to 20 mg po bid, she may finish up  Bentyl 10 mg tabs and then switch to 20 mg po bid.

## 2012-12-09 NOTE — Telephone Encounter (Signed)
Patient was started on Linzess, she states that this gives her diarrhea.  She wants to know if she can return to taking the dicyclomine qid for her abdominal spasms?

## 2012-12-10 MED ORDER — DICYCLOMINE HCL 20 MG PO TABS: 20.0000 mg | ORAL_TABLET | Freq: Two times a day (BID) | ORAL | Status: DC

## 2012-12-10 NOTE — Telephone Encounter (Signed)
I have notified the patient via voicemail.  She is asked to call back for questions

## 2012-12-20 ENCOUNTER — Other Ambulatory Visit: Payer: Self-pay | Admitting: Nurse Practitioner

## 2013-01-28 ENCOUNTER — Telehealth: Payer: Self-pay | Admitting: Nurse Practitioner

## 2013-01-28 NOTE — Telephone Encounter (Signed)
Patient aware that [facility] has no available appts on Monday was offered another day or another provider

## 2013-02-12 ENCOUNTER — Telehealth: Payer: Self-pay | Admitting: Nurse Practitioner

## 2013-02-12 NOTE — Telephone Encounter (Signed)
Sore throat x 2 days.  Denies congestion or post nasal drainage. Noticed tender bumps on tongue today. Denies white patches or cracks in her tongue. She did start using some chloraseptic throat lozenges yesterday. Suggested she discontinue the lozenges because they could be making her tongue sore. Encouraged warm salt water gargles and increasing water intake.  May use a mild hard candy to help keep throat lubricated.   She is unable to come in to the office because she is in [facility] this week.   Encouraged her to visit a local urgent care if symptoms worsened or did not improve. Patient stated understanding and agreement to plan.

## 2013-02-24 ENCOUNTER — Other Ambulatory Visit: Payer: Self-pay

## 2013-02-24 NOTE — Telephone Encounter (Signed)
Needs to be seen for refill

## 2013-02-24 NOTE — Telephone Encounter (Signed)
Last seen 09/11/12  MMM  If approved print and route to nurse

## 2013-02-25 NOTE — Telephone Encounter (Signed)
Pt aware.

## 2013-03-06 ENCOUNTER — Encounter: Payer: Self-pay | Admitting: Nurse Practitioner

## 2013-03-06 ENCOUNTER — Telehealth: Payer: Self-pay | Admitting: Nurse Practitioner

## 2013-03-06 ENCOUNTER — Ambulatory Visit (INDEPENDENT_AMBULATORY_CARE_PROVIDER_SITE_OTHER): Payer: BC Managed Care – PPO | Admitting: Nurse Practitioner

## 2013-03-06 VITALS — BP 103/49 | HR 59 | Temp 98.5°F | Ht 64.5 in | Wt 175.0 lb

## 2013-03-06 DIAGNOSIS — I4891 Unspecified atrial fibrillation: Secondary | ICD-10-CM

## 2013-03-06 DIAGNOSIS — K589 Irritable bowel syndrome without diarrhea: Secondary | ICD-10-CM

## 2013-03-06 DIAGNOSIS — B370 Candidal stomatitis: Secondary | ICD-10-CM

## 2013-03-06 DIAGNOSIS — F411 Generalized anxiety disorder: Secondary | ICD-10-CM

## 2013-03-06 DIAGNOSIS — K219 Gastro-esophageal reflux disease without esophagitis: Secondary | ICD-10-CM

## 2013-03-06 DIAGNOSIS — G4733 Obstructive sleep apnea (adult) (pediatric): Secondary | ICD-10-CM

## 2013-03-06 DIAGNOSIS — R350 Frequency of micturition: Secondary | ICD-10-CM

## 2013-03-06 LAB — POCT URINALYSIS DIPSTICK
Bilirubin, UA: NEGATIVE
Blood, UA: NEGATIVE
Glucose, UA: NEGATIVE
Ketones, UA: NEGATIVE
Leukocytes, UA: NEGATIVE
Nitrite, UA: NEGATIVE
Protein, UA: NEGATIVE
Spec Grav, UA: <=1.005
Urobilinogen, UA: NEGATIVE
pH, UA: 8

## 2013-03-06 LAB — POCT UA - MICROSCOPIC ONLY
Casts, Ur, LPF, POC: NEGATIVE
Crystals, Ur, HPF, POC: NEGATIVE
RBC, urine, microscopic: NEGATIVE
Yeast, UA: NEGATIVE

## 2013-03-06 MED ORDER — OMEPRAZOLE 40 MG PO CPDR: 40.0000 mg | DELAYED_RELEASE_CAPSULE | Freq: Every day | ORAL | Status: DC

## 2013-03-06 MED ORDER — MAGIC MOUTHWASH: 10.0000 mL | Freq: Four times a day (QID) | ORAL | Status: DC

## 2013-03-06 NOTE — Patient Instructions (Addendum)

## 2013-03-06 NOTE — Progress Notes (Signed)
Subjective:    Patient ID: Doris Smith, female    DOB: Feb 24, 1951, 62 y.o.   MRN: [id number]  Anxiety Presents for follow-up visit. Onset was more than 5 years ago. Symptoms include dry mouth, irritability, nervous/anxious behavior, palpitations, panic and shortness of breath. Patient reports no chest pain. Symptoms occur constantly. The severity of symptoms is interfering with daily activities, causing significant distress and severe. The symptoms are aggravated by family issues. The quality of sleep is poor. Nighttime awakenings: one to two.   Her past medical history is significant for anxiety/panic attacks, arrhythmia (A FIB), bipolar disorder and depression. Past treatments include non-benzodiazephine anxiolytics and benzodiazephines. The treatment provided mild relief. Compliance with prior treatments has been good.  Gastrophageal Reflux She complains of belching. She reports no chest pain or no heartburn. This is a chronic problem. The current episode started more than 1 year ago. The problem occurs rarely. The problem has been resolved. The symptoms are aggravated by certain foods. Associated symptoms include fatigue. Risk factors include hiatal hernia. She has tried a PPI for the symptoms. The treatment provided significant relief.  Atrial Fib Dr. Chales Abrahams- Started on metoprolol- keeping rate under control Sleep apnea CPAP machine- says that sleep apnea is affecting her heart Bipolar/depression Cymbalta, lamictal, ativan and buspar- Still has episodes of anxiety but is under a lot of stress with family matters.- Says she doesn't want anything else for now.  Review of Systems  Constitutional: Positive for irritability and fatigue.  Respiratory: Positive for shortness of breath.   Cardiovascular: Positive for palpitations. Negative for chest pain.  Gastrointestinal: Negative for heartburn.  Psychiatric/Behavioral: The patient is nervous/anxious.   All other systems reviewed and are  negative.       Objective:   Physical Exam  Vitals reviewed. Constitutional: She is oriented to person, place, and time. She appears well-developed and well-nourished.  HENT:  Head: Normocephalic.  Right Ear: External ear normal.  Left Ear: External ear normal.  Mouth/Throat: Oropharynx is clear and moist.  Eyes: Pupils are equal, round, and reactive to light.  Neck: Normal range of motion. Neck supple. No thyromegaly present.  Cardiovascular: Normal rate, regular rhythm, normal heart sounds and intact distal pulses.   Pulmonary/Chest: Effort normal and breath sounds normal.  Abdominal: Soft. Bowel sounds are normal. She exhibits no distension. There is no tenderness.  Musculoskeletal: Normal range of motion. She exhibits no edema and no tenderness.  Neurological: She is alert and oriented to person, place, and time.  Skin: Skin is warm and dry.  Psychiatric: Judgment and thought content normal. Her mood appears anxious. She is agitated.    BP 103/49  Pulse 59  Temp(Src) 98.5 F (36.9 C) (Oral)  Ht 5' 4.5" (1.638 m)  Wt 175 lb (79.379 kg)  BMI 29.59 kg/m2     Assessment & Plan:   1. Urinary frequency   2. GAD (generalized anxiety disorder)   3. Atrial fibrillation   4. Obstructive sleep apnea   5. IBS (irritable bowel syndrome)   6. GERD (gastroesophageal reflux disease)   7. Thrush    Orders Placed This Encounter  Procedures  . CMP14+EGFR  . NMR, lipoprofile  . POCT UA - Microscopic Only  . POCT urinalysis dipstick   Outpatient Encounter Prescriptions as of 03/06/2013  Medication Sig Dispense Refill  . aspirin 325 MG tablet Take 325 mg by mouth daily.      . busPIRone (BUSPAR) 10 MG tablet Take 10 mg by mouth 3 (three) times  daily.       . dicyclomine (BENTYL) 20 MG tablet Take 1 tablet (20 mg total) by mouth 2 (two) times daily.  60 tablet  3  . DULoxetine (CYMBALTA) 60 MG capsule Take 60 mg by mouth 2 (two) times daily.       Marland Kitchen lamoTRIgine (LAMICTAL) 100 MG  tablet Take 100 mg by mouth at bedtime.       Marland Kitchen LORazepam (ATIVAN) 0.5 MG tablet Take 0.5 mg by mouth every 8 (eight) hours as needed.        . metoprolol (LOPRESSOR) 50 MG tablet Take 50 mg by mouth 2 (two) times daily.      Marland Kitchen omeprazole (PRILOSEC) 40 MG capsule Take 1 capsule (40 mg total) by mouth daily.  60 capsule  5  . [DISCONTINUED] loperamide (IMODIUM) 2 MG capsule Take 2 mg by mouth 4 (four) times daily as needed. diarrhea       . [DISCONTINUED] omeprazole (PRILOSEC) 40 MG capsule TAKE 1 CAPSULE BY MOUTH TWICE DAILY  60 capsule  2  . Alum & Mag Hydroxide-Simeth (MAGIC MOUTHWASH) SOLN Take 10 mLs by mouth 4 (four) times daily.  300 mL  0  . [DISCONTINUED] acetaminophen (TYLENOL) 500 MG tablet Take 500 mg by mouth every 6 (six) hours as needed. Pain       . [DISCONTINUED] ciclesonide (OMNARIS) 50 MCG/ACT nasal spray Place 1 spray into both nostrils daily.       . [DISCONTINUED] fluticasone (VERAMYST) 27.5 MCG/SPRAY nasal spray Place 1 spray into the nose 2 (two) times daily.        . [DISCONTINUED] Linaclotide (LINZESS) 145 MCG CAPS Take 1 capsule (145 mcg total) by mouth daily.  30 capsule  2  . [DISCONTINUED] methocarbamol (ROBAXIN) 500 MG tablet Take 500 mg by mouth as needed.      . [DISCONTINUED] Phenylephrine-Aspirin (ALKA-SELTZER PLUS SINUS PO) Take 1 tablet by mouth 2 (two) times daily as needed. Congestion        No facility-administered encounter medications on file as of 03/06/2013.  Continue all meds Stress management discussed Diet and exercise encouraged  [name] Daphine Deutscher, FNP

## 2013-03-07 ENCOUNTER — Telehealth: Payer: Self-pay | Admitting: Nurse Practitioner

## 2013-03-07 LAB — CMP14+EGFR
ALT: 16 IU/L (ref 0–32)
AST: 18 IU/L (ref 0–40)
Albumin/Globulin Ratio: 1.7 (ref 1.1–2.5)
Albumin: 4 g/dL (ref 3.6–4.8)
Alkaline Phosphatase: 82 IU/L (ref 39–117)
BUN/Creatinine Ratio: 8 — ABNORMAL LOW (ref 11–26)
BUN: 7 mg/dL — ABNORMAL LOW (ref 8–27)
CO2: 26 mmol/L (ref 18–29)
Calcium: 9.1 mg/dL (ref 8.6–10.2)
Chloride: 102 mmol/L (ref 97–108)
Creatinine, Ser: 0.91 mg/dL (ref 0.57–1.00)
GFR calc Af Amer: 79 mL/min/{1.73_m2} (ref >59)
GFR calc non Af Amer: 69 mL/min/{1.73_m2} (ref >59)
Globulin, Total: 2.3 g/dL (ref 1.5–4.5)
Glucose: 89 mg/dL (ref 65–99)
Potassium: 3.9 mmol/L (ref 3.5–5.2)
Sodium: 141 mmol/L (ref 134–144)
Total Bilirubin: 0.3 mg/dL (ref 0.0–1.2)
Total Protein: 6.3 g/dL (ref 6.0–8.5)

## 2013-03-07 LAB — NMR, LIPOPROFILE
Cholesterol: 189 mg/dL (ref <200)
HDL Cholesterol by NMR: 78 mg/dL (ref >=40)
HDL Particle Number: 39.8 umol/L (ref >=30.5)
LDL Particle Number: 806 nmol/L (ref <1000)
LDL Size: 21.2 nm (ref >20.5)
LDLC SERPL CALC-MCNC: 90 mg/dL (ref <100)
LP-IR Score: 27 (ref <=45)
Small LDL Particle Number: <90 nmol/L (ref <=527)
Triglycerides by NMR: 107 mg/dL (ref <150)

## 2013-03-07 NOTE — Telephone Encounter (Signed)
Pharmacy received rx for magic mouthwash yesterday- call them and check- according to pharmacy they received rx

## 2013-03-07 NOTE — Telephone Encounter (Signed)
Mmm please address? 

## 2013-03-13 ENCOUNTER — Other Ambulatory Visit: Payer: Self-pay | Admitting: Family Medicine

## 2013-03-13 MED ORDER — FLUCONAZOLE 150 MG PO TABS: 150.0000 mg | ORAL_TABLET | Freq: Once | ORAL | Status: DC

## 2013-03-13 NOTE — Telephone Encounter (Signed)
Diflucan sent to her pharmacy

## 2013-03-18 ENCOUNTER — Telehealth: Payer: Self-pay | Admitting: Nurse Practitioner

## 2013-03-24 ENCOUNTER — Telehealth: Payer: Self-pay | Admitting: Nurse Practitioner

## 2013-03-25 ENCOUNTER — Other Ambulatory Visit: Payer: Self-pay

## 2013-03-25 MED ORDER — PHENTERMINE HCL 37.5 MG PO CAPS: 37.5000 mg | ORAL_CAPSULE | ORAL | Status: DC

## 2013-03-25 NOTE — Telephone Encounter (Signed)
Already refill x 1

## 2013-03-25 NOTE — Telephone Encounter (Signed)
Last seen 03/06/13  MMM   Phentermine not on [place]IC list   If approved route to nurse to call in to [facility]

## 2013-03-25 NOTE — Telephone Encounter (Signed)
Patient knows what you said ,but please refill her thentermine 37.5mg . She can not stand the weight she has put on

## 2013-03-25 NOTE — Telephone Encounter (Signed)
rx ready for pickup 

## 2013-03-26 NOTE — Telephone Encounter (Signed)
Up front to pick up 

## 2013-03-26 NOTE — Telephone Encounter (Signed)
Up front 

## 2013-04-18 ENCOUNTER — Other Ambulatory Visit: Payer: Self-pay | Admitting: Nurse Practitioner

## 2013-05-08 ENCOUNTER — Telehealth: Payer: Self-pay | Admitting: Family Medicine

## 2013-05-08 MED ORDER — NYSTATIN 100000 UNIT/ML MT SUSP: 500000.0000 [IU] | Freq: Four times a day (QID) | OROMUCOSAL | Status: DC

## 2013-05-08 NOTE — Telephone Encounter (Signed)
Doris Smith has came back would like something called into [facility] [place] [address]

## 2013-05-08 NOTE — Telephone Encounter (Signed)
No it did not it went [facility]

## 2013-05-08 NOTE — Telephone Encounter (Signed)
nystain rx sent to pharmacy 

## 2013-05-08 NOTE — Telephone Encounter (Signed)
Doris Smith is back. Can meds be called in to [facility] in [place]? Please advise

## 2013-05-09 NOTE — Telephone Encounter (Signed)
There are [facility] in [place]

## 2013-05-12 ENCOUNTER — Other Ambulatory Visit: Payer: Self-pay | Admitting: Nurse Practitioner

## 2013-05-12 NOTE — Telephone Encounter (Signed)
can'tfind pharmacy in [place]- need pharmacy address

## 2013-05-12 NOTE — Telephone Encounter (Signed)
Phone number [phone]

## 2013-05-15 ENCOUNTER — Ambulatory Visit (INDEPENDENT_AMBULATORY_CARE_PROVIDER_SITE_OTHER): Payer: BC Managed Care – PPO | Admitting: Internal Medicine

## 2013-05-15 ENCOUNTER — Encounter: Payer: Self-pay | Admitting: Internal Medicine

## 2013-05-15 VITALS — BP 110/60 | HR 68 | Ht 64.5 in | Wt 186.4 lb

## 2013-05-15 DIAGNOSIS — K589 Irritable bowel syndrome without diarrhea: Secondary | ICD-10-CM

## 2013-05-15 MED ORDER — SACCHAROMYCES BOULARDII 250 MG PO CAPS: 250.0000 mg | ORAL_CAPSULE | Freq: Two times a day (BID) | ORAL | Status: DC

## 2013-05-15 MED ORDER — LUBIPROSTONE 8 MCG PO CAPS: 8.0000 ug | ORAL_CAPSULE | Freq: Two times a day (BID) | ORAL | Status: DC
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
## 2013-05-15 NOTE — Telephone Encounter (Signed)
Patient has gotten prescription

## 2013-05-15 NOTE — Patient Instructions (Addendum)
We have sent the following medications to your pharmacy for you to pick up at your convenience: Amitiza 8 mcg-Take 1 tablet by mouth twice daily  We have given you samples of Florastor. This puts good bacteria back into your colon. You should take 1 capsule by mouth once daily. If this works well for you, it can be purchased over the counter.  Please follow up with Dr Juanda Chance in 6 months.  CC: Dr Rudi Heap

## 2013-06-23 ENCOUNTER — Other Ambulatory Visit: Payer: Self-pay | Admitting: *Deleted

## 2013-06-23 MED ORDER — LUBIPROSTONE 8 MCG PO CAPS: 8.0000 ug | ORAL_CAPSULE | Freq: Two times a day (BID) | ORAL | Status: DC

## 2013-06-24 ENCOUNTER — Telehealth: Payer: Self-pay | Admitting: Nurse Practitioner

## 2013-06-24 NOTE — Telephone Encounter (Signed)
Per Hyla grandchild was in hospital and was diagnosed with whooping cough. [employer] called today. Advised Doris Smith to call health dept and discuss with them what treatment she needs at this time. Pt verbalized understanding.

## 2013-07-01 ENCOUNTER — Other Ambulatory Visit: Payer: Self-pay

## 2013-07-01 NOTE — Telephone Encounter (Signed)
Last seen 03/06/13  MMM If approved route to nurse to call into [facility] 

## 2013-07-02 MED ORDER — PHENTERMINE HCL 37.5 MG PO CAPS: 37.5000 mg | ORAL_CAPSULE | ORAL | Status: DC

## 2013-07-07 NOTE — Telephone Encounter (Signed)
Called in.

## 2013-07-08 ENCOUNTER — Other Ambulatory Visit: Payer: Self-pay | Admitting: *Deleted

## 2013-07-08 MED ORDER — METOPROLOL TARTRATE 50 MG PO TABS: 50.0000 mg | ORAL_TABLET | Freq: Two times a day (BID) | ORAL | Status: DC

## 2013-08-04 ENCOUNTER — Telehealth: Payer: Self-pay | Admitting: Nurse Practitioner

## 2013-08-04 NOTE — Telephone Encounter (Signed)
neds to be seen to see if losing weight

## 2013-08-04 NOTE — Telephone Encounter (Signed)
Patient aware and will schedule appointment.

## 2013-08-11 ENCOUNTER — Other Ambulatory Visit: Payer: Self-pay

## 2013-08-11 NOTE — Telephone Encounter (Signed)
Last seen 03/06/13  MMM If approved route to nurse to call into [facility]

## 2013-08-11 NOTE — Telephone Encounter (Signed)
Needs to be seen to check weight

## 2013-08-14 ENCOUNTER — Other Ambulatory Visit: Payer: Self-pay | Admitting: *Deleted

## 2013-08-14 NOTE — Telephone Encounter (Signed)
ntbs to check weight in order to get refill

## 2013-09-29 ENCOUNTER — Telehealth: Payer: Self-pay | Admitting: Nurse Practitioner

## 2013-09-29 NOTE — Telephone Encounter (Signed)
PAtient just wanted an appointment

## 2013-10-01 ENCOUNTER — Encounter: Payer: Self-pay | Admitting: Nurse Practitioner

## 2013-10-01 ENCOUNTER — Ambulatory Visit (INDEPENDENT_AMBULATORY_CARE_PROVIDER_SITE_OTHER): Payer: BC Managed Care – PPO | Admitting: Nurse Practitioner

## 2013-10-01 VITALS — BP 100/56 | HR 55 | Temp 98.2°F | Ht 64.5 in | Wt 190.6 lb

## 2013-10-01 DIAGNOSIS — I4891 Unspecified atrial fibrillation: Secondary | ICD-10-CM

## 2013-10-01 DIAGNOSIS — M5137 Other intervertebral disc degeneration, lumbosacral region: Secondary | ICD-10-CM

## 2013-10-01 DIAGNOSIS — I10 Essential (primary) hypertension: Secondary | ICD-10-CM

## 2013-10-01 DIAGNOSIS — E669 Obesity, unspecified: Secondary | ICD-10-CM

## 2013-10-01 DIAGNOSIS — K219 Gastro-esophageal reflux disease without esophagitis: Secondary | ICD-10-CM | POA: Insufficient documentation

## 2013-10-01 DIAGNOSIS — F411 Generalized anxiety disorder: Secondary | ICD-10-CM

## 2013-10-01 DIAGNOSIS — K589 Irritable bowel syndrome without diarrhea: Secondary | ICD-10-CM

## 2013-10-01 MED ORDER — PHENTERMINE HCL 37.5 MG PO TABS: 37.5000 mg | ORAL_TABLET | Freq: Every day | ORAL | Status: DC

## 2013-10-01 MED ORDER — OMEPRAZOLE 40 MG PO CPDR: 40.0000 mg | DELAYED_RELEASE_CAPSULE | Freq: Every day | ORAL | Status: DC

## 2013-10-01 MED ORDER — LORAZEPAM 0.5 MG PO TABS: 0.5000 mg | ORAL_TABLET | Freq: Two times a day (BID) | ORAL | Status: DC

## 2013-10-01 NOTE — Patient Instructions (Signed)

## 2013-10-01 NOTE — Progress Notes (Signed)
Subjective:    Patient ID: Doris Smith, female    DOB: 1950-07-18, 63 y.o.   MRN: [id number]  HPI Patient presents today for follow up of chronic problems. Patient is also complaining of decreased ROM in her neck and would like to discuss weight loss.  GERD Omeprazole 40 mg daily. When taken daily, sx are under control but patient does not remember to take all the time. IBS Sees gastroenterologist every 3-6 months to manage. Prescribed Amitza which has helped to control bloating and constipation. Bipolar Disorder Sees psychologist every 6 weeks for management of bipolar. Moods are still very variable according to patient. Patient states she is still depressed and tired. Does take Ativan nightly but has difficulty staying sleep. Hypertension/A. Fib Currently taking Metoprolol and Aspirin daily. Sees cardiologist every 3-6 months. Has had recent episodes of A. Fib but did not need conversion. Chronic back and neck pain On cymbalta which helps some with her pain tolerance.   *Patients cervical ROM is impaired on the left side due pain. Rates pain 3/10. Has taken Tylenol which provides some relief.  Patient had back surgery 2 years ago by Dr. Maxie Better at [facility] Has not seen him recently.   *Weight loss - Has taken medication for weight loss in the past. Currently exercises daily 30 mins. States she has a diet and exercise plan to go along with medication.  Review of Systems  Constitutional: Positive for fatigue.  Respiratory: Negative for shortness of breath.   Cardiovascular: Negative for chest pain.  Musculoskeletal: Positive for myalgias.  Psychiatric/Behavioral: Positive for sleep disturbance. The patient is nervous/anxious.   All other systems reviewed and are negative.       Objective:   Physical Exam  Constitutional: She is oriented to person, place, and time. She appears well-developed and well-nourished.  HENT:  Head: Normocephalic.  Right Ear: External ear  normal.  Left Ear: External ear normal.  Mouth/Throat: Oropharynx is clear and moist.  Eyes: Pupils are equal, round, and reactive to light.  Neck: Neck supple. Decreased range of motion (with left rotation and extension) present.  Cardiovascular: A regularly irregular rhythm present.  Pulmonary/Chest: Effort normal and breath sounds normal.  Abdominal: There is tenderness (Throughout all quadrants).  Neurological: She is alert and oriented to person, place, and time.  Skin: Skin is warm and dry.  Psychiatric: She has a normal mood and affect. Her behavior is normal. Judgment and thought content normal.   BP 100/56  Pulse 55  Temp(Src) 98.2 F (36.8 C) (Oral)  Ht 5' 4.5" (1.638 m)  Wt 190 lb 9.6 oz (86.456 kg)  BMI 32.22 kg/m2        Assessment & Plan:   1. GERD (gastroesophageal reflux disease)   2. GAD (generalized anxiety disorder)   3. Obesity (BMI 30.0-34.9)   4. Irritable bowel syndrome   5. HYPERTENSION   6. DEGENERATIVE DISC DISEASE, LUMBOSACRAL SPINE   7. Atrial fibrillation    Orders Placed This Encounter  Procedures  . CMP14+EGFR  . NMR, lipoprofile   Meds ordered this encounter  Medications  . gabapentin (NEURONTIN) 100 MG capsule    Sig: Take 100 mg by mouth 4 (four) times daily.   Marland Kitchen LORazepam (ATIVAN) 0.5 MG tablet    Sig: Take 1 tablet (0.5 mg total) by mouth 2 (two) times daily.    Dispense:  60 tablet    Refill:  1    Order Specific Question:  Supervising Provider  Answer:  Chipper Herb [[id number]]  . omeprazole (PRILOSEC) 40 MG capsule    Sig: Take 1 capsule (40 mg total) by mouth daily.    Dispense:  60 capsule    Refill:  5    Order Specific Question:  Supervising Provider    Answer:  Chipper Herb [[id number]]  . phentermine (ADIPEX-P) 37.5 MG tablet    Sig: Take 1 tablet (37.5 mg total) by mouth daily before breakfast.    Dispense:  30 tablet    Refill:  1    Order Specific Question:  Supervising Provider    Answer:  Chipper Herb [[id number]]     Labs pending Health maintenance reviewed - Patient to schedule mammogram  Diet and exercise encouraged - discussed the importance of having a fitness plan to go along with medication, will weight in 1 month Maintain all specialty provider appts  Continue all meds Follow up  In 3 months   [place], FNP

## 2013-10-03 LAB — NMR, LIPOPROFILE
Cholesterol: 206 mg/dL — ABNORMAL HIGH (ref <200)
HDL Cholesterol by NMR: 84 mg/dL (ref >=40)
HDL Particle Number: 43.5 umol/L (ref >=30.5)
LDL Particle Number: 924 nmol/L (ref <1000)
LDL Size: 21.9 nm (ref >20.5)
LDLC SERPL CALC-MCNC: 94 mg/dL (ref <100)
LP-IR Score: <25 (ref <=45)
Small LDL Particle Number: <90 nmol/L (ref <=527)
Triglycerides by NMR: 138 mg/dL (ref <150)

## 2013-10-03 LAB — CMP14+EGFR
ALT: 9 IU/L (ref 0–32)
AST: 16 IU/L (ref 0–40)
Albumin/Globulin Ratio: 1.9 (ref 1.1–2.5)
Albumin: 4.1 g/dL (ref 3.6–4.8)
Alkaline Phosphatase: 103 IU/L (ref 39–117)
BUN/Creatinine Ratio: 9 — ABNORMAL LOW (ref 11–26)
BUN: 7 mg/dL — ABNORMAL LOW (ref 8–27)
CO2: 28 mmol/L (ref 18–29)
Calcium: 9.4 mg/dL (ref 8.7–10.3)
Chloride: 101 mmol/L (ref 97–108)
Creatinine, Ser: 0.82 mg/dL (ref 0.57–1.00)
GFR calc Af Amer: 90 mL/min/{1.73_m2} (ref >59)
GFR calc non Af Amer: 78 mL/min/{1.73_m2} (ref >59)
Globulin, Total: 2.2 g/dL (ref 1.5–4.5)
Glucose: 89 mg/dL (ref 65–99)
Potassium: 4.6 mmol/L (ref 3.5–5.2)
Sodium: 141 mmol/L (ref 134–144)
Total Bilirubin: 0.6 mg/dL (ref 0.0–1.2)
Total Protein: 6.3 g/dL (ref 6.0–8.5)

## 2013-10-26 ENCOUNTER — Other Ambulatory Visit: Payer: Self-pay | Admitting: Nurse Practitioner

## 2013-11-03 ENCOUNTER — Telehealth: Payer: Self-pay | Admitting: Nurse Practitioner

## 2013-11-03 NOTE — Telephone Encounter (Signed)
ntbs

## 2013-11-03 NOTE — Telephone Encounter (Signed)
Appointment made for 11/06/13 mmm

## 2013-11-06 ENCOUNTER — Ambulatory Visit (INDEPENDENT_AMBULATORY_CARE_PROVIDER_SITE_OTHER): Payer: BC Managed Care – PPO | Admitting: Nurse Practitioner

## 2013-11-06 ENCOUNTER — Encounter: Payer: Self-pay | Admitting: Nurse Practitioner

## 2013-11-06 VITALS — BP 108/58 | HR 59 | Temp 97.0°F | Ht 64.0 in | Wt 191.8 lb

## 2013-11-06 DIAGNOSIS — M7700 Medial epicondylitis, unspecified elbow: Secondary | ICD-10-CM

## 2013-11-06 DIAGNOSIS — R42 Dizziness and giddiness: Secondary | ICD-10-CM

## 2013-11-06 DIAGNOSIS — M7701 Medial epicondylitis, right elbow: Secondary | ICD-10-CM

## 2013-11-06 MED ORDER — MELOXICAM 15 MG PO TABS: 15.0000 mg | ORAL_TABLET | Freq: Every day | ORAL | Status: DC

## 2013-11-06 NOTE — Progress Notes (Signed)
   Subjective:    Patient ID: Doris Smith, female    DOB: 07/21/1950, 63 y.o.   MRN: [id number]  HPI Patient in today c/o dizziness- when she stands up she feels like she is going to fall- sometimes she can't walk straight- Has been going on for awhile.  * She is also c/o of right elbow pian- Has a knot on back of it and pain will radiate down hand.    Review of Systems     Objective:   Physical Exam  Constitutional: She is oriented to person, place, and time. She appears well-developed and well-nourished.  Cardiovascular: Normal rate, regular rhythm and normal heart sounds.   Pulmonary/Chest: Effort normal and breath sounds normal.  Musculoskeletal:  Pain on palpation medial side of right elbow Grips equal bilaterally   Neurological: She is alert and oriented to person, place, and time. She has normal reflexes. No cranial nerve deficit.  Skin: Skin is warm and dry.  Psychiatric: She has a normal mood and affect. Her behavior is normal. Judgment and thought content normal.  BP 108/58  Pulse 59  Temp(Src) 97 F (36.1 C) (Oral)  Ht 5\' 4"  (1.626 m)  Wt 191 lb 12.8 oz (87 kg)  BMI 32.91 kg/m2         Assessment & Plan:  1. Golfers elbow of right upper extremity Wear tennis elbow strap that already have at home If hurts stop what you are doing - meloxicam (MOBIC) 15 MG tablet; Take 1 tablet (15 mg total) by mouth daily.  Dispense: 30 tablet; Refill: 1  2. Vertigo Force fluids Use cane when walking - Ambulatory referral to Neurology   [place], FNP

## 2013-11-06 NOTE — Patient Instructions (Signed)
Vertigo Vertigo means you feel like you or your surroundings are moving when they are not. Vertigo can be dangerous if it occurs when you are at work, driving, or performing difficult activities.  CAUSES  Vertigo occurs when there is a conflict of signals sent to your brain from the visual and sensory systems in your body. There are many different causes of vertigo, including:  Infections, especially in the inner ear.  A bad reaction to a drug or misuse of alcohol and medicines.  Withdrawal from drugs or alcohol.  Rapidly changing positions, such as lying down or rolling over in bed.  A migraine headache.  Decreased blood flow to the brain.  Increased pressure in the brain from a head injury, infection, tumor, or bleeding. SYMPTOMS  You may feel as though the world is spinning around or you are falling to the ground. Because your balance is upset, vertigo can cause nausea and vomiting. You may have involuntary eye movements (nystagmus). DIAGNOSIS  Vertigo is usually diagnosed by physical exam. If the cause of your vertigo is unknown, your caregiver may perform imaging tests, such as an MRI scan (magnetic resonance imaging). TREATMENT  Most cases of vertigo resolve on their own, without treatment. Depending on the cause, your caregiver may prescribe certain medicines. If your vertigo is related to body position issues, your caregiver may recommend movements or procedures to correct the problem. In rare cases, if your vertigo is caused by certain inner ear problems, you may need surgery. HOME CARE INSTRUCTIONS   Follow your caregiver's instructions.  Avoid driving.  Avoid operating heavy machinery.  Avoid performing any tasks that would be dangerous to you or others during a vertigo episode.  Tell your caregiver if you notice that certain medicines seem to be causing your vertigo. Some of the medicines used to treat vertigo episodes can actually make them worse in some people. SEEK  IMMEDIATE MEDICAL CARE IF:   Your medicines do not relieve your vertigo or are making it worse.  You develop problems with talking, walking, weakness, or using your arms, hands, or legs.  You develop severe headaches.  Your nausea or vomiting continues or gets worse.  You develop visual changes.  A family member notices behavioral changes.  Your condition gets worse. MAKE SURE YOU:  Understand these instructions.  Will watch your condition.  Will get help right away if you are not doing well or get worse. Document Released: 04/05/2005 Document Revised: 09/18/2011 Document Reviewed: 01/12/2011 [employer] Patient Information 2014 [employer].  

## 2013-11-11 ENCOUNTER — Other Ambulatory Visit: Payer: BC Managed Care – PPO | Admitting: Nurse Practitioner

## 2013-12-05 ENCOUNTER — Ambulatory Visit: Payer: BC Managed Care – PPO | Admitting: Neurology

## 2013-12-09 ENCOUNTER — Encounter: Payer: Self-pay | Admitting: Neurology

## 2013-12-23 ENCOUNTER — Other Ambulatory Visit: Payer: BC Managed Care – PPO | Admitting: Nurse Practitioner

## 2013-12-25 ENCOUNTER — Other Ambulatory Visit: Payer: Self-pay | Admitting: Internal Medicine

## 2013-12-31 ENCOUNTER — Telehealth: Payer: Self-pay | Admitting: Internal Medicine

## 2013-12-31 MED ORDER — LUBIPROSTONE 8 MCG PO CAPS: 8.0000 ug | ORAL_CAPSULE | Freq: Two times a day (BID) | ORAL | Status: DC

## 2013-12-31 NOTE — Telephone Encounter (Signed)
Rx sent 

## 2014-01-02 ENCOUNTER — Ambulatory Visit: Payer: BC Managed Care – PPO | Admitting: Nurse Practitioner

## 2014-01-23 ENCOUNTER — Ambulatory Visit (INDEPENDENT_AMBULATORY_CARE_PROVIDER_SITE_OTHER): Payer: BC Managed Care – PPO | Admitting: Internal Medicine

## 2014-01-23 ENCOUNTER — Encounter: Payer: Self-pay | Admitting: Internal Medicine

## 2014-01-23 ENCOUNTER — Other Ambulatory Visit: Payer: BC Managed Care – PPO

## 2014-01-23 VITALS — BP 100/60 | HR 64 | Ht 65.0 in | Wt 201.1 lb

## 2014-01-23 DIAGNOSIS — K5900 Constipation, unspecified: Secondary | ICD-10-CM

## 2014-01-23 DIAGNOSIS — K589 Irritable bowel syndrome without diarrhea: Secondary | ICD-10-CM

## 2014-01-23 MED ORDER — LUBIPROSTONE 24 MCG PO CAPS: 24.0000 ug | ORAL_CAPSULE | Freq: Two times a day (BID) | ORAL | Status: DC

## 2014-01-23 NOTE — Progress Notes (Signed)
Doris Smith 04-13-1951 [id number]  Note: This dictation was prepared with Dragon digital system. Any transcriptional errors that result from this procedure are unintentional.   History of Present Illness:  This is a 63 year old white female with irritable bowel syndrome and anxiety. Her last appointment was in November 2014. Her condition has improved in that her bowel habits are regular but she still has difficulty evacuating and cleaning  the rectum. She had an extensive GI evaluation in 2009 which included a negative upper endoscopy, negative abdominal ultrasound and HIDA scan. A CT scan in 2014 showed a hiatal hernia. She is up-to-date on her colonoscopy which was done in December 2009 because of family history of colon polyps. She claims to be  intolerant to a high fiber and in fact she avoids fiber entirely. She is satisfied with Amitiza 8 mcg twice a day., but may need higher dose    Past Medical History  Diagnosis Date  . PONV (postoperative nausea and vomiting)   . Asthma 04/07/2011    dx  . Shortness of breath     with rest and exertion at times  . Sleep apnea 04/2011     dx  . GERD (gastroesophageal reflux disease)   . Headache(784.0)     migraines  . Anxiety     takes Ativan  . Mental disorder     dx bipolar  . Depression   . Fracture, ankle 10/2008    right and left  . Irritable bowel syndrome   . Fatty liver   . A-fib 11/2012    Past Surgical History  Procedure Laterality Date  . Tonsillectomy      as child  . Partial hysterectomy  1982  . Dilation and curettage of uterus  1981    abnormal pap  . Knee arthroscopy Left 2007    x 2  . Total knee arthroplasty Left 12/18/2005  . Lumbar laminectomy/decompression microdiscectomy  05/31/2011    Procedure: LUMBAR LAMINECTOMY/DECOMPRESSION MICRODISCECTOMY;  Surgeon: Johnn Hai;  Location: [facility];  Service: Orthopedics;  Laterality: Left;  Decompression Lumbar four to five and  Lumbar five to Sacral one on Left   (X-Ray)    Allergies  Allergen Reactions  . Iohexol     "over 20 years ago" had reaction "hurting in arm and heart"-Done in [place], [place].Marland Kitchenphysician stopped CT at that time.  . Codeine Other (See Comments)    Makes feel like she is wild   . Sulfonamide Derivatives Other (See Comments)    Upset stomach  . Vitamin B12     Family history and social history have been reviewed.  Review of Systems: Negative for heartburn nausea, positive for occasional vomiting  The remainder of the 10 point ROS is negative except as outlined in the H&P  Physical Exam: General Appearance Well developed, in no distress, mildly overweight Eyes  Non icteric  HEENT  Non traumatic, normocephalic  Mouth No lesion, tongue papillated, no cheilosis Neck Supple without adenopathy, thyroid not enlarged, no carotid bruits, no JVD Lungs Clear to auscultation bilaterally COR Normal S1, normal S2, regular rhythm, no murmur, quiet precordium Abdomen soft nontender with normoactive bowel sounds. Liver edge at costal margin. Rectal  Extremities  No pedal edema Skin No lesions Neurological Alert and oriented x 3 Psychological Normal mood and affect  Assessment and Plan:   Problem #17 63 year old white female with irritable bowel syndrome who has been clinically improved on Amitiza 8 mcg twice a day. I think she would benefit  from increasing the dose to 24 mcg twice a day. Another alternative would be to use Linzess 145 mcg daily. We will also check a sprue panel today. She is up-to-date on her colonoscopy.    Delfin Edis 01/23/2014

## 2014-01-23 NOTE — Patient Instructions (Signed)
Your physician has requested that you go to the basement for the following lab work before leaving today: Celiac 10 panel  We have sent the following medications to your pharmacy for you to pick up at your convenience: Amitiza 24 mcg twice daily  CC:Dr Redge Gainer

## 2014-01-26 LAB — CELIAC PANEL 10
Endomysial Screen: NEGATIVE
Gliadin IgA: 6.8 U/mL (ref <20)
Gliadin IgG: 3.7 U/mL (ref <20)
IgA: 186 mg/dL (ref 69–380)
Tissue Transglut Ab: 6.8 U/mL (ref <20)
Tissue Transglutaminase Ab, IgA: 6.4 U/mL (ref <20)

## 2014-01-28 ENCOUNTER — Encounter: Payer: Self-pay | Admitting: *Deleted

## 2014-01-30 ENCOUNTER — Telehealth: Payer: Self-pay | Admitting: Nurse Practitioner

## 2014-01-30 NOTE — Telephone Encounter (Signed)
Patient states that she is having knee spasms and would like a muscle relaxer. Did not want to see anyone else. Gave her an appt for Monday with you but wanted to know if you would call her in some muscle relaxers for the weekend. Please advise

## 2014-02-02 ENCOUNTER — Ambulatory Visit (INDEPENDENT_AMBULATORY_CARE_PROVIDER_SITE_OTHER): Payer: BC Managed Care – PPO | Admitting: Nurse Practitioner

## 2014-02-02 ENCOUNTER — Encounter: Payer: Self-pay | Admitting: Nurse Practitioner

## 2014-02-02 VITALS — BP 137/70 | HR 68 | Temp 97.9°F | Ht 65.0 in | Wt 203.6 lb

## 2014-02-02 DIAGNOSIS — M25569 Pain in unspecified knee: Secondary | ICD-10-CM

## 2014-02-02 DIAGNOSIS — M25519 Pain in unspecified shoulder: Secondary | ICD-10-CM

## 2014-02-02 DIAGNOSIS — M25512 Pain in left shoulder: Secondary | ICD-10-CM

## 2014-02-02 DIAGNOSIS — M25562 Pain in left knee: Secondary | ICD-10-CM

## 2014-02-02 MED ORDER — TRAMADOL HCL 50 MG PO TABS: 50.0000 mg | ORAL_TABLET | Freq: Three times a day (TID) | ORAL | Status: DC | PRN

## 2014-02-02 NOTE — Telephone Encounter (Signed)
Will discuss at appointment.

## 2014-02-02 NOTE — Progress Notes (Signed)
   Subjective:    Patient ID: Doris Smith, female    DOB: 10/18/1950, 63 y.o.   MRN: [id number]  HPI Patient in c/o pain in left lower leg- seems to be worse at night. Pian is mainly on the sides of leg- no swelling- has had a total knee replacement on the left and it is starting to hurt and now right knee is hurting. SHe has not seen ortho in awhile. Rates pain 8/10 during intermittent episodes. SHe says that tylenol helps a little. She also says that she has been having left shoulder pain- sharp pains when she moves it in  A certain direction.    Review of Systems  Constitutional: Negative.   HENT: Negative.   Respiratory: Negative.   Cardiovascular: Negative.   Gastrointestinal: Negative.   Genitourinary: Negative.   Neurological: Negative.   Psychiatric/Behavioral: Negative.   All other systems reviewed and are negative.      Objective:   Physical Exam  Constitutional: She is oriented to person, place, and time. She appears well-developed and well-nourished.  Cardiovascular: Normal rate, regular rhythm and normal heart sounds.   Pulmonary/Chest: Effort normal and breath sounds normal.  Musculoskeletal:  FROM of left knee with pain on full flexion amd extension. No left calf swelling FROM of left shoulder with pain on internal rotation- grips equal bil  Neurological: She is alert and oriented to person, place, and time.  Skin: Skin is warm and dry.  Psychiatric: She has a normal mood and affect. Her behavior is normal. Judgment and thought content normal.    BP 137/70  Pulse 68  Temp(Src) 97.9 F (36.6 C) (Oral)  Ht 5\' 5"  (1.651 m)  Wt 203 lb 9.6 oz (92.352 kg)  BMI 33.88 kg/m2       Assessment & Plan:   1. Left knee pain   2. Left shoulder pain    Meds ordered this encounter  Medications  . traMADol (ULTRAM) 50 MG tablet    Sig: Take 1 tablet (50 mg total) by mouth every 8 (eight) hours as needed.    Dispense:  30 tablet    Refill:  0    Order Specific  Question:  Supervising Provider    Answer:  Chipper Herb [[id number]]   Orders Placed This Encounter  Procedures  . Ambulatory referral to Orthopedic Surgery    Referral Priority:  Routine    Referral Type:  Surgical    Referral Reason:  Specialty Services Required    Referred to Provider:  Augustin Schooling, MD    Requested Specialty:  Orthopedic Surgery    Number of Visits Requested:  1   RTO prn  [name] Done, FNP

## 2014-02-02 NOTE — Patient Instructions (Signed)

## 2014-03-02 ENCOUNTER — Telehealth: Payer: Self-pay | Admitting: Internal Medicine

## 2014-03-02 MED ORDER — LINACLOTIDE 145 MCG PO CAPS: 145.0000 ug | ORAL_CAPSULE | Freq: Every day | ORAL | Status: DC

## 2014-03-02 NOTE — Telephone Encounter (Signed)
Rx sent to pharmacy. Unable to reach patient. Line busy. Will try again later.

## 2014-03-02 NOTE — Telephone Encounter (Signed)
If the Amitiza does not work well, stop it and try Linzess 145 ug, 1 po qd, #30, 2 refills.

## 2014-03-02 NOTE — Telephone Encounter (Signed)
Patient states she got our letter about her results. She states she is taking the Amitiza BID but is still straining to have bowel movement. Stool is soft "but it just won't come out." Please, advise.

## 2014-03-03 NOTE — Telephone Encounter (Signed)
Unable to reach patient. Phone rings then disconnects.

## 2014-03-03 NOTE — Telephone Encounter (Signed)
Spoke with patient and gave her Dr. Brodie's recommendation.  

## 2014-03-03 NOTE — Telephone Encounter (Signed)
Line busy

## 2014-03-04 ENCOUNTER — Ambulatory Visit: Payer: BC Managed Care – PPO | Admitting: Nurse Practitioner

## 2014-03-30 ENCOUNTER — Telehealth: Payer: Self-pay | Admitting: Nurse Practitioner

## 2014-03-30 DIAGNOSIS — R2681 Unsteadiness on feet: Secondary | ICD-10-CM

## 2014-03-30 NOTE — Telephone Encounter (Signed)
Patient is having trouble with gait and balance and steadiness. Was referred before to someone in [place] but was unable to go. Would like new referral to someone in [place] or [employer]. Also wants you to know that diet med you gave her she took some of first bottle and then stopped for a while but now she has started and is on it regularly and they appear to be working. Just wanted you to know

## 2014-04-02 ENCOUNTER — Telehealth: Payer: Self-pay | Admitting: Nurse Practitioner

## 2014-04-02 ENCOUNTER — Telehealth: Payer: Self-pay | Admitting: Pharmacist

## 2014-04-02 NOTE — Telephone Encounter (Signed)
Referral was made to neurology but can take awhile to get appointment

## 2014-04-02 NOTE — Telephone Encounter (Signed)
Appointment made

## 2014-04-09 ENCOUNTER — Ambulatory Visit (INDEPENDENT_AMBULATORY_CARE_PROVIDER_SITE_OTHER): Payer: BC Managed Care – PPO | Admitting: Pharmacist

## 2014-04-09 ENCOUNTER — Encounter: Payer: Self-pay | Admitting: Pharmacist

## 2014-04-09 VITALS — BP 130/72 | HR 78 | Ht 65.0 in | Wt 203.0 lb

## 2014-04-09 DIAGNOSIS — F5102 Adjustment insomnia: Secondary | ICD-10-CM | POA: Insufficient documentation

## 2014-04-09 DIAGNOSIS — E669 Obesity, unspecified: Secondary | ICD-10-CM | POA: Insufficient documentation

## 2014-04-09 DIAGNOSIS — I4891 Unspecified atrial fibrillation: Secondary | ICD-10-CM

## 2014-04-09 NOTE — Progress Notes (Signed)
Subjective:     Doris Smith is a 63 y.o. female here for discussion regarding weight loss. Patient had lost 60# with diet changes and phentermine.  [place], She has noted a weight gain of approximately 30# pounds over the last 6 months. She is really feeling bad about herself and is frustarated.  She feels ideal weight is 175 pounds. Weight at graduation from high school was 140 pounds. History of eating disorders: none. There is a family history positive for obesity in the patient, mother and sister. Previous treatments for obesity include commercial weight loss program: [employer], prescription appetite suppressants: phenteramine and self-directed dieting - has been following FODMAP diet. FODMAP diet is a diet which is commonly prescribed for IBS which recommends limiting fermentable oligo-di-monosaccharides and polyols.    Obesity associated medical conditions: depression, hyperlipidemia, pulmonary disease and sleep apnea.  Obesity associated medications: lamictal, gabapentin Cardiovascular risk factors besides obesity: advanced age (older than 25 for men, 24 for women), dyslipidemia, family history of premature cardiovascular disease, obesity (BMI >= 30 kg/m2) and sedentary lifestyle.  Patient states that she only sleep about 2-3 hours per night - under a lot of stress with parents. She has a CPAP but doesn't use regularly. Diet- sometimes eats healthy - salad, vegetables but at other times doesn't - [employer] sweet tea, pepsi and when at her parents her father cook a lot with fatback and "grease"  Patient has diagnosis of Afib but we have not notes regarding recent evaluation by cardiologist because Dr Baxter Hire that she saw is with [facility].  Also haven't received record from recent office visit's with pulmonologist Dr Elijio Miles.  The following portions of the patient's history were reviewed and updated as appropriate: allergies, current medications, past family history,  past medical history, past social history, past surgical history and problem list.   Objective:    Body mass index is 33.78 kg/(m^2).  Filed Weights   04/09/14 1004  Weight: 203 lb (92.08 kg)   Filed Vitals:   04/09/14 1004  BP: 130/72  Pulse: 78     Assessment:    Obesity. I assessed Doris Smith to be in an action stage with respect to weight loss Medication Management - weight loss efforts possibly more difficult with gabapentin and /or lamictal A-Fib - need notes from cardio.  Insomnia   Plan:    General weight loss/lifestyle modification strategies discussed (elicit support from others; identify saboteurs; non-food rewards, etc). Behavioral treatment: stress management. Diet interventions: moderate (500 kCal/d) deficit diet. Informal exercise measures discussed, e.g. taking stairs instead of elevator. Will discuss with her PCP about possibly medication changes such as discontinuation of gabapentin and adding belsomnra for sleep Requesting records from cardiologist - Dr Elonda Husky and pulmonologist - Dr Greggory Stallion Follow up in: 4 to 6 weeks

## 2014-04-13 ENCOUNTER — Encounter: Payer: Self-pay | Admitting: Neurology

## 2014-04-13 ENCOUNTER — Ambulatory Visit (INDEPENDENT_AMBULATORY_CARE_PROVIDER_SITE_OTHER): Payer: BC Managed Care – PPO | Admitting: Neurology

## 2014-04-13 VITALS — BP 110/60 | HR 64 | Temp 98.1°F | Ht 65.0 in | Wt 206.0 lb

## 2014-04-13 DIAGNOSIS — G4733 Obstructive sleep apnea (adult) (pediatric): Secondary | ICD-10-CM

## 2014-04-13 DIAGNOSIS — R2689 Other abnormalities of gait and mobility: Secondary | ICD-10-CM

## 2014-04-13 NOTE — Patient Instructions (Addendum)
Please start using your CPAP.  Drink more water, and reduce your caffeine intake.  Stress and poor sleep can make your balance worse.  Talk to Ms. Hassell Done about doing Physical therapy for gait and balance.  We will do a brain MRI and we will call you with the results. Use your cane for balance.

## 2014-04-13 NOTE — Progress Notes (Signed)
Subjective:    Patient ID: Doris Smith is a 63 y.o. female.  HPI    Star Age, MD, PhD [facility] [address].O. [place], [place] [address]  Dear [name],   I saw your patient, Doris Smith, upon your kind request in my neurologic clinic today for initial consultation of her gait disorder. The patient is unaccompanied today. As you know, [name] is a 63 year old right-handed woman with an underlying medical history of reflux disease and irritable bowel syndrome, recurrent headaches, anxiety, history of bipolar disorder, atrial fibrillation, asthma and obstructive sleep apnea, status post tonsillectomy, status post hysterectomy, degenerative lower back disease, status post lumbar laminectomy in November 2012 (Dr. Tonita Cong), degenerative arthritis of her knee, status post left total knee arthroplasty in June 2007 (Dr. Veverly Fells), who reports having gait and balance issues for several months, worse in the last 2-3 months. She has fallen. 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  She has been using a cane for some time, probably since the knee. She has fallen. The cane helps when she goes out, at home she does not use it. She feels, that she is pulled in one direction. She has occasional vertigo. She has been drinking a lot of green tea, she does not use her CPAP. She has not done PT recently.   Her Past Medical History Is Significant For: Past Medical History  Diagnosis Date  . PONV (postoperative nausea and vomiting)   . Asthma 04/07/2011    dx  . Shortness of breath     with rest and exertion at times  . Sleep apnea 04/2011     dx  . GERD (gastroesophageal reflux disease)   . Headache(784.0)     migraines  . Anxiety     takes Ativan  . Mental disorder     dx bipolar  . Depression   . Fracture, ankle 10/2008    right and left  . Irritable bowel syndrome   . Fatty liver   . A-fib 11/2012  . Fibromyalgia   . Depression     Her Past  Surgical History Is Significant For: Past Surgical History  Procedure Laterality Date  . Tonsillectomy      as child  . Partial hysterectomy  1982  . Dilation and curettage of uterus  1981    abnormal pap  . Knee arthroscopy Left 2007    x 2  . Total knee arthroplasty Left 12/18/2005  . Lumbar laminectomy/decompression microdiscectomy  05/31/2011    Procedure: LUMBAR LAMINECTOMY/DECOMPRESSION MICRODISCECTOMY;  Surgeon: Johnn Hai;  Location: [facility];  Service: Orthopedics;  Laterality: Left;  Decompression Lumbar four to five and  Lumbar five to Sacral one on Left  (X-Ray)    Her Family History Is Significant For: Family History  Problem Relation Age of Onset  . Anesthesia problems Mother   . Heart disease Mother   . Heart failure Mother   . Anesthesia problems Sister   . Colon polyps Father   . Heart disease Father   . Hypertension Sister     Her Social History Is Significant For: History   Social History  . Marital Status: Married    Spouse Name: N/A    Number of Children: 2  . Years of Education: 12   Occupational History  . Disabled    Social History Main Topics  . Smoking status: Never Smoker   . Smokeless tobacco: Never Used  . Alcohol Use: No  .  Drug Use: No  . Sexual Activity: Yes    Birth Control/ Protection: Post-menopausal   Other Topics Concern  . None   Social History Narrative   Consumes 4 cups of caffeine weekly    Her Allergies Are:  Allergies  Allergen Reactions  . Iohexol     "over 20 years ago" had reaction "hurting in arm and heart"-Done in [place], [place].Marland Kitchenphysician stopped CT at that time.  . Codeine Other (See Comments)    Makes feel like she is wild   . Sulfonamide Derivatives Other (See Comments)    Upset stomach  . Vitamin B12   :   Her Current Medications Are:  Outpatient Encounter Prescriptions as of 04/13/2014  Medication Sig  . aspirin 325 MG tablet Take 325 mg by mouth daily.  . DULoxetine (CYMBALTA) 60 MG capsule Take  60 mg by mouth 2 (two) times daily.   . Flunisolide HFA (AEROSPAN) 80 MCG/ACT AERS Inhale into the lungs 2 (two) times daily.   Marland Kitchen gabapentin (NEURONTIN) 100 MG capsule Take 100 mg by mouth 4 (four) times daily.   Marland Kitchen lamoTRIgine (LAMICTAL) 100 MG tablet Take 200 mg by mouth 2 (two) times daily.   . Linaclotide (LINZESS) 145 MCG CAPS capsule Take 1 capsule (145 mcg total) by mouth daily.  Marland Kitchen LORazepam (ATIVAN) 0.5 MG tablet Take 0.5 mg by mouth 2 (two) times daily as needed.  . meloxicam (MOBIC) 15 MG tablet Take 1 tablet (15 mg total) by mouth daily.  . metoprolol (LOPRESSOR) 50 MG tablet TAKE 1 TABLET (50 MG TOTAL) BY MOUTH 2 (TWO) TIMES DAILY.  . mometasone (NASONEX) 50 MCG/ACT nasal spray Place 2 sprays into the nose daily.  Marland Kitchen omeprazole (PRILOSEC) 40 MG capsule Take 1 capsule (40 mg total) by mouth daily.  . traMADol (ULTRAM) 50 MG tablet Take 1 tablet (50 mg total) by mouth every 8 (eight) hours as needed.  : Review of Systems:  Out of a complete 14 point review of systems, all are reviewed and negative with the exception of these symptoms as listed below:   Review of Systems  Constitutional: Positive for fatigue.       Weight gain  Respiratory: Positive for cough.   Endocrine: Positive for cold intolerance and heat intolerance.  Musculoskeletal:       Joint pain, aching muscles  Allergic/Immunologic:       Allergies  Neurological: Positive for headaches.       Sleepiness, snoring restless legs,difficulty swallowing some pills    Objective:  Neurologic Exam  Physical Exam Physical Examination:   Filed Vitals:   04/13/14 1010  BP: 110/60  Pulse: 64  Temp: 98.1 F (36.7 C)    General Examination: The patient is a very pleasant 63 y.o. female in no acute distress. She appears well-developed and well-nourished and well groomed.   HEENT: Normocephalic, atraumatic, pupils are equal, round and reactive to light and accommodation. Funduscopic exam is normal with sharp disc  margins noted. Extraocular tracking is good without limitation to gaze excursion or nystagmus noted. Normal smooth pursuit is noted. Hearing is grossly intact. Tympanic membranes are clear bilaterally. Face is symmetric with normal facial animation and normal facial sensation. Speech is clear with no dysarthria noted. There is no hypophonia. There is no lip, neck/head, jaw or voice tremor. Neck is supple with full range of passive and active motion. There are no carotid bruits on auscultation. Oropharynx exam reveals: mild mouth dryness, adequate dental hygiene and moderate airway crowding, due  to redundant soft palate and wider uvula. Mallampati is class II. Tongue protrudes centrally and palate elevates symmetrically. Tonsils are absent. Neck size is 141/5 inches. She has a tiny overbite. Nasal inspection reveals no significant nasal mucosal bogginess or redness and no septal deviation.   Chest: Clear to auscultation without wheezing, rhonchi or crackles noted.  Heart: S1+S2+0, regular and normal without murmurs, rubs or gallops noted.   Abdomen: Soft, non-tender and non-distended with normal bowel sounds appreciated on auscultation.  Extremities: There is no pitting edema in the distal lower extremities bilaterally. Pedal pulses are intact.  Skin: Warm and dry without trophic changes noted. There are no varicose veins.  Musculoskeletal: exam reveals no obvious joint deformities, tenderness or joint swelling or erythema.   Neurologically:  Mental status: The patient is awake, alert and oriented in all 4 spheres. Her immediate and remote memory, attention, language skills and fund of knowledge are appropriate. There is no evidence of aphasia, agnosia, apraxia or anomia. Speech is clear with normal prosody and enunciation. Thought process is linear. Mood is constricted and affect is blunted.  Cranial nerves II - XII are as described above under HEENT exam. In addition: shoulder shrug is normal with  equal shoulder height noted. Motor exam: Normal bulk, strength and tone is noted. There is no drift, tremor or rebound. Romberg is negative. Reflexes are 2+ throughout. Babinski: Toes are flexor bilaterally. Fine motor skills and coordination: intact with normal finger taps, normal hand movements, normal rapid alternating patting, normal foot taps and normal foot agility.  Cerebellar testing: No dysmetria or intention tremor on finger to nose testing. Heel to shin is unremarkable bilaterally. There is no truncal or gait ataxia.  Sensory exam: intact to light touch, pinprick, vibration, temperature sense in the upper and lower extremities.  Gait, station and balance: She stands easily. No veering to one side is noted. No leaning to one side is noted. Posture is age-appropriate and stance is slightly wide based. Gait shows normal stride length and normal pace. No problems turning are noted. She turns en bloc. Tandem walk is difficult for her Intact toe stance is noted.               Assessment and Plan:   In summary, VIRGIA KELNER is a very pleasant 63 y.o.-year old female with an underlying medical history of reflux disease and irritable bowel syndrome, recurrent headaches, anxiety, history of bipolar disorder, atrial fibrillation, asthma and obstructive sleep apnea, status post tonsillectomy, status post hysterectomy, degenerative lower back disease, status post lumbar laminectomy in November 2012 (Dr. Tonita Cong), degenerative arthritis of her knee, status post left total knee arthroplasty in June 2007 (Dr. Veverly Fells), who reports having gait and balance issues for several months, worse in the last 2-3 months. Her history and physical exam are concerning for a multifactorial gait disorder, likely a combination of poor sleep, untreated OSA, stress, dehydration, and medication effects. She is reassured, that there is no actual focal findings on exam. I strongly encouraged her to start using her CPAP. She is advised  to be well hydrated and reduce her caffeine intake. She is advised to use her cane for safety. We will do a brain MRI wo contrast and call her with the results. You may consider PT for gait and balance with her.  I had a long chat with the patient about my findings and her Sx. We talked about maintaining a healthy lifestyle in general. I encouraged the patient to eat healthy, exercise  daily and keep well hydrated, to keep a scheduled bedtime and wake time routine, to not skip any meals and eat healthy snacks in between meals and to have protein with every meal.   As far as further diagnostic testing is concerned, I suggested the following today: MRI brain w/o Gad.  As far as medications are concerned, I recommended the following at this time: no new medications.  I answered all her questions today and the patient was in agreement with the above outlined plan. I will see her back as needed.   Thank you very much for allowing me to participate in the care of this nice patient. If I can be of any further assistance to you please do not hesitate to call me at [phone].  Sincerely,   Star Age, MD, PhD

## 2014-04-17 ENCOUNTER — Ambulatory Visit
Admission: RE | Admit: 2014-04-17 | Discharge: 2014-04-17 | Disposition: A | Discharge status: Home / Self Care | Payer: BC Managed Care – PPO | Source: Ambulatory Visit | Attending: Neurology | Admitting: Neurology

## 2014-04-17 DIAGNOSIS — G4733 Obstructive sleep apnea (adult) (pediatric): Secondary | ICD-10-CM

## 2014-04-17 DIAGNOSIS — R2689 Other abnormalities of gait and mobility: Secondary | ICD-10-CM

## 2014-04-20 ENCOUNTER — Encounter: Payer: Self-pay | Admitting: *Deleted

## 2014-04-20 NOTE — Progress Notes (Signed)
Quick Note:  pls advise pt: MRI brain wo contrast was reported as normal. Star Age, MD, PhD [facility]  ______

## 2014-04-20 NOTE — Progress Notes (Signed)
Quick Note:  Mailed copy to pt. ______

## 2014-08-21 ENCOUNTER — Other Ambulatory Visit: Payer: Self-pay | Admitting: Nurse Practitioner

## 2014-08-21 NOTE — Telephone Encounter (Signed)
adipex rx ready for pick up

## 2014-08-21 NOTE — Telephone Encounter (Signed)
rx called into pharmacy

## 2014-10-31 ENCOUNTER — Other Ambulatory Visit: Payer: Self-pay | Admitting: Nurse Practitioner

## 2014-11-03 ENCOUNTER — Encounter: Payer: Self-pay | Admitting: Nurse Practitioner

## 2014-11-03 ENCOUNTER — Ambulatory Visit (INDEPENDENT_AMBULATORY_CARE_PROVIDER_SITE_OTHER): Payer: BLUE CROSS/BLUE SHIELD

## 2014-11-03 ENCOUNTER — Ambulatory Visit (INDEPENDENT_AMBULATORY_CARE_PROVIDER_SITE_OTHER): Payer: BLUE CROSS/BLUE SHIELD | Admitting: Nurse Practitioner

## 2014-11-03 ENCOUNTER — Encounter (INDEPENDENT_AMBULATORY_CARE_PROVIDER_SITE_OTHER): Payer: Self-pay

## 2014-11-03 VITALS — BP 119/68 | HR 57 | Temp 97.2°F | Ht 65.0 in | Wt 211.0 lb

## 2014-11-03 DIAGNOSIS — Z0000 Encounter for general adult medical examination without abnormal findings: Secondary | ICD-10-CM

## 2014-11-03 DIAGNOSIS — I4891 Unspecified atrial fibrillation: Secondary | ICD-10-CM

## 2014-11-03 DIAGNOSIS — K589 Irritable bowel syndrome without diarrhea: Secondary | ICD-10-CM

## 2014-11-03 DIAGNOSIS — Z01419 Encounter for gynecological examination (general) (routine) without abnormal findings: Secondary | ICD-10-CM | POA: Diagnosis not present

## 2014-11-03 DIAGNOSIS — E669 Obesity, unspecified: Secondary | ICD-10-CM

## 2014-11-03 DIAGNOSIS — F3170 Bipolar disorder, currently in remission, most recent episode unspecified: Secondary | ICD-10-CM

## 2014-11-03 DIAGNOSIS — K219 Gastro-esophageal reflux disease without esophagitis: Secondary | ICD-10-CM

## 2014-11-03 DIAGNOSIS — I10 Essential (primary) hypertension: Secondary | ICD-10-CM

## 2014-11-03 LAB — POCT URINALYSIS DIPSTICK
Bilirubin, UA: NEGATIVE
Blood, UA: NEGATIVE
Glucose, UA: NEGATIVE
Ketones, UA: NEGATIVE
Leukocytes, UA: NEGATIVE
Nitrite, UA: NEGATIVE
Protein, UA: NEGATIVE
Spec Grav, UA: 1.015
Urobilinogen, UA: NEGATIVE
pH, UA: 6

## 2014-11-03 LAB — POCT CBC
Granulocyte percent: 66.9 %G (ref 37–80)
HCT, POC: 41.6 % (ref 37.7–47.9)
Hemoglobin: 12.9 g/dL (ref 12.2–16.2)
Lymph, poc: 1.7 (ref 0.6–3.4)
MCH, POC: 27.4 pg (ref 27–31.2)
MCHC: 31 g/dL — AB (ref 31.8–35.4)
MCV: 88.3 fL (ref 80–97)
MPV: 8.3 fL (ref 0–99.8)
POC Granulocyte: 4.6 (ref 2–6.9)
POC LYMPH PERCENT: 24.4 %L (ref 10–50)
Platelet Count, POC: 286 10*3/uL (ref 142–424)
RBC: 4.71 M/uL (ref 4.04–5.48)
RDW, POC: 14.2 %
WBC: 6.9 10*3/uL (ref 4.6–10.2)

## 2014-11-03 LAB — POCT UA - MICROSCOPIC ONLY
Bacteria, U Microscopic: NEGATIVE
Casts, Ur, LPF, POC: NEGATIVE
Crystals, Ur, HPF, POC: NEGATIVE
Mucus, UA: NEGATIVE
RBC, urine, microscopic: NEGATIVE
WBC, Ur, HPF, POC: NEGATIVE
Yeast, UA: NEGATIVE

## 2014-11-03 MED ORDER — OMEPRAZOLE 40 MG PO CPDR: 40.0000 mg | DELAYED_RELEASE_CAPSULE | Freq: Every day | ORAL | Status: DC

## 2014-11-03 MED ORDER — METOPROLOL TARTRATE 50 MG PO TABS: ORAL_TABLET | ORAL | Status: DC

## 2014-11-03 NOTE — Progress Notes (Addendum)
Subjective:    Patient ID: Doris Smith, female    DOB: 1951/04/16, 64 y.o.   MRN: [id number]  HPI  Patient presents today for follow up of chronic problems. Patient having to take care of her elderly mother which is causing stress.  GERD Omeprazole 40 mg daily. When taken daily, sx are under control but patient does not remember to take all the time. IBS Sees gastroenterologist every 3-6 months to manage. Prescribed Amitza and linzess but patient says that neither  has helped to control bloating and constipation. Sees GI several times a year Bipolar Disorder Sees psychologist every 6 weeks for management of bipolar. Moods are still very variable according to patient. Patient states she is still depressed and tired. Does take Ativan nightly but has difficulty staying sleep. Hypertension/A. Fib Currently taking Metoprolol and Aspirin daily. Sees cardiologist every 3-6 months. Has had recent episodes of A. Fib but did not need conversion. Chronic back and neck pain On cymbalta which helps some with her pain tolerance. depression On prozac- sees Chapman Moss for counseling- does not want to change meds   Review of Systems  Constitutional: Positive for fatigue.  HENT: Negative.   Respiratory: Negative for shortness of breath.   Cardiovascular: Negative for chest pain.  Genitourinary: Negative.   Musculoskeletal: Positive for myalgias.  Neurological: Negative.   Psychiatric/Behavioral: Positive for sleep disturbance. The patient is nervous/anxious.   All other systems reviewed and are negative.      Objective:   Physical Exam  Constitutional: She is oriented to person, place, and time. She appears well-developed and well-nourished.  HENT:  Head: Normocephalic.  Right Ear: Hearing, tympanic membrane, external ear and ear canal normal.  Left Ear: Hearing, tympanic membrane, external ear and ear canal normal.  Nose: Nose normal.  Mouth/Throat: Uvula is midline and oropharynx is  clear and moist.  Eyes: Conjunctivae and EOM are normal. Pupils are equal, round, and reactive to light.  Neck: Full passive range of motion without pain. Neck supple. No JVD present. Carotid bruit is not present. Decreased range of motion: with left rotation and extension. No thyroid mass and no thyromegaly present.  Cardiovascular: Normal rate, normal heart sounds and intact distal pulses.  A regularly irregular rhythm present.  No murmur heard. Pulmonary/Chest: Effort normal and breath sounds normal. Right breast exhibits no inverted nipple, no mass, no nipple discharge, no skin change and no tenderness. Left breast exhibits no inverted nipple, no mass, no nipple discharge, no skin change and no tenderness.  Abdominal: Soft. Bowel sounds are normal. She exhibits no mass. There is tenderness (Throughout all quadrants).  Genitourinary: Vagina normal and uterus normal. No breast swelling, tenderness, discharge or bleeding.  bimanual exam-No adnexal masses or tenderness. Vaginal cuff intact  Lymphadenopathy:    She has no cervical adenopathy.  Neurological: She is alert and oriented to person, place, and time.  Skin: Skin is warm and dry.  Psychiatric: She has a normal mood and affect. Her behavior is normal. Judgment and thought content normal.   BP 119/68 mmHg  Pulse 57  Temp(Src) 97.2 F (36.2 C) (Oral)  Ht _0  (1.651 m)  Wt 211 lb (95.709 kg)  BMI 35.11 kg/m2   EKG- sinus bradycardia-[name] Done, FNP  Chest x ray normal=Preliminary reading by Ronnald Collum, FNP  [facility]     Assessment & Plan:  1. Annual physical exam - POCT UA - Microscopic Only - POCT urinalysis dipstick - POCT CBC - Thyroid Panel With TSH -  Vit D  25 hydroxy (rtn osteoporosis monitoring) - DG Chest 2 View; Future - EKG 12-Lead  2. Essential hypertension Do not add slat to diet - CMP14+EGFR - NMR, lipoprofile  3. Atrial fibrillation, unspecified Avoid caffeine - metoprolol (LOPRESSOR) 50 MG  tablet; TAKE 1 TABLET (50 MG TOTAL) BY MOUTH 2 (TWO) TIMES DAILY.  Dispense: 60 tablet; Refill: 0  4. Irritable bowel syndrome Keep follow up with GI  5. Gastroesophageal reflux disease without esophagitis Avoid spicy foods Do not eat 2 hours prior to bedtime - omeprazole (PRILOSEC) 40 MG capsule; Take 1 capsule (40 mg total) by mouth daily.  Dispense: 30 capsule; Refill: 0  6. Obesity (BMI 30-39.9) Discussed diet and exercise for person with BMI >25 Will recheck weight in 3-6 months   7. Bipolar disorder in partial remission, most recent episode unspecified type/depression Continue counseling  8. Encounter for routine gynecological examination - Pap IG w/ reflex to HPV when ASC-U    Labs pending Health maintenance reviewed Diet and exercise encouraged Continue all meds Follow up  In 6 month   [place], FNP

## 2014-11-03 NOTE — Addendum Note (Signed)
Addended by: Earlene Plater on: 11/03/2014 11:05 AM   Modules accepted: Miquel Dunn

## 2014-11-03 NOTE — Patient Instructions (Signed)
Fat and Cholesterol Control Diet Fat and cholesterol levels in your blood and organs are influenced by your diet. High levels of fat and cholesterol may lead to diseases of the heart, small and large blood vessels, gallbladder, liver, and pancreas. CONTROLLING FAT AND CHOLESTEROL WITH DIET Although exercise and lifestyle factors are important, your diet is key. That is because certain foods are known to raise cholesterol and others to lower it. The goal is to balance foods for their effect on cholesterol and more importantly, to replace saturated and trans fat with other types of fat, such as monounsaturated fat, polyunsaturated fat, and omega-3 fatty acids. On average, a person should consume no more than 15 to 17 g of saturated fat daily. Saturated and trans fats are considered "bad" fats, and they will raise LDL cholesterol. Saturated fats are primarily found in animal products such as meats, butter, and cream. However, that does not mean you need to give up all your favorite foods. Today, there are good tasting, low-fat, low-cholesterol substitutes for most of the things you like to eat. Choose low-fat or nonfat alternatives. Choose round or loin cuts of red meat. These types of cuts are lowest in fat and cholesterol. Chicken (without the skin), fish, veal, and ground turkey breast are great choices. Eliminate fatty meats, such as hot dogs and salami. Even shellfish have little or no saturated fat. Have a 3 oz (85 g) portion when you eat lean meat, poultry, or fish. Trans fats are also called "partially hydrogenated oils." They are oils that have been scientifically manipulated so that they are solid at room temperature resulting in a longer shelf life and improved taste and texture of foods in which they are added. Trans fats are found in stick margarine, some tub margarines, cookies, crackers, and baked goods.  When baking and cooking, oils are a great substitute for butter. The monounsaturated oils are  especially beneficial since it is believed they lower LDL and raise HDL. The oils you should avoid entirely are saturated tropical oils, such as coconut and palm.  Remember to eat a lot from food groups that are naturally free of saturated and trans fat, including fish, fruit, vegetables, beans, grains (barley, rice, couscous, bulgur wheat), and pasta (without cream sauces).  IDENTIFYING FOODS THAT LOWER FAT AND CHOLESTEROL  Soluble fiber may lower your cholesterol. This type of fiber is found in fruits such as apples, vegetables such as broccoli, potatoes, and carrots, legumes such as beans, peas, and lentils, and grains such as barley. Foods fortified with plant sterols (phytosterol) may also lower cholesterol. You should eat at least 2 g per day of these foods for a cholesterol lowering effect.  Read package labels to identify low-saturated fats, trans fat free, and low-fat foods at the supermarket. Select cheeses that have only 2 to 3 g saturated fat per ounce. Use a heart-healthy tub margarine that is free of trans fats or partially hydrogenated oil. When buying baked goods (cookies, crackers), avoid partially hydrogenated oils. Breads and muffins should be made from whole grains (whole-wheat or whole oat flour, instead of "flour" or "enriched flour"). Buy non-creamy canned soups with reduced salt and no added fats.  FOOD PREPARATION TECHNIQUES  Never deep-fry. If you must fry, either stir-fry, which uses very little fat, or use non-stick cooking sprays. When possible, broil, bake, or roast meats, and steam vegetables. Instead of putting butter or margarine on vegetables, use lemon and herbs, applesauce, and cinnamon (for squash and sweet potatoes). Use nonfat   yogurt, salsa, and low-fat dressings for salads.  LOW-SATURATED FAT / LOW-FAT FOOD SUBSTITUTES Meats / Saturated Fat (g)  Avoid: Steak, marbled (3 oz/85 g) / 11 g  Choose: Steak, lean (3 oz/85 g) / 4 g  Avoid: Hamburger (3 oz/85 g) / 7  g  Choose: Hamburger, lean (3 oz/85 g) / 5 g  Avoid: Ham (3 oz/85 g) / 6 g  Choose: Ham, lean cut (3 oz/85 g) / 2.4 g  Avoid: Chicken, with skin, dark meat (3 oz/85 g) / 4 g  Choose: Chicken, skin removed, dark meat (3 oz/85 g) / 2 g  Avoid: Chicken, with skin, light meat (3 oz/85 g) / 2.5 g  Choose: Chicken, skin removed, light meat (3 oz/85 g) / 1 g Dairy / Saturated Fat (g)  Avoid: Whole milk (1 cup) / 5 g  Choose: Low-fat milk, 2% (1 cup) / 3 g  Choose: Low-fat milk, 1% (1 cup) / 1.5 g  Choose: Skim milk (1 cup) / 0.3 g  Avoid: Hard cheese (1 oz/28 g) / 6 g  Choose: Skim milk cheese (1 oz/28 g) / 2 to 3 g  Avoid: Cottage cheese, 4% fat (1 cup) / 6.5 g  Choose: Low-fat cottage cheese, 1% fat (1 cup) / 1.5 g  Avoid: Ice cream (1 cup) / 9 g  Choose: Sherbet (1 cup) / 2.5 g  Choose: Nonfat frozen yogurt (1 cup) / 0.3 g  Choose: Frozen fruit bar / trace  Avoid: Whipped cream (1 tbs) / 3.5 g  Choose: Nondairy whipped topping (1 tbs) / 1 g Condiments / Saturated Fat (g)  Avoid: Mayonnaise (1 tbs) / 2 g  Choose: Low-fat mayonnaise (1 tbs) / 1 g  Avoid: Butter (1 tbs) / 7 g  Choose: Extra light margarine (1 tbs) / 1 g  Avoid: Coconut oil (1 tbs) / 11.8 g  Choose: Olive oil (1 tbs) / 1.8 g  Choose: Corn oil (1 tbs) / 1.7 g  Choose: Safflower oil (1 tbs) / 1.2 g  Choose: Sunflower oil (1 tbs) / 1.4 g  Choose: Soybean oil (1 tbs) / 2.4 g  Choose: Canola oil (1 tbs) / 1 g Document Released: 06/26/2005 Document Revised: 10/21/2012 Document Reviewed: 09/24/2013 [employer] Patient Information 2015 [employer]. This information is not intended to replace advice given to you by your health care provider. Make sure you discuss any questions you have with your health care provider.  

## 2014-11-04 LAB — NMR, LIPOPROFILE
Cholesterol: 202 mg/dL — ABNORMAL HIGH (ref 100–199)
HDL Cholesterol by NMR: 82 mg/dL (ref >39)
HDL Particle Number: 39.6 umol/L (ref >=30.5)
LDL Particle Number: 848 nmol/L (ref <1000)
LDL Size: 21.6 nm (ref >20.5)
LDL-C: 97 mg/dL (ref 0–99)
LP-IR Score: <25 (ref <=45)
Small LDL Particle Number: <90 nmol/L (ref <=527)
Triglycerides by NMR: 114 mg/dL (ref 0–149)

## 2014-11-04 LAB — CMP14+EGFR
ALT: 10 IU/L (ref 0–32)
AST: 17 IU/L (ref 0–40)
Albumin/Globulin Ratio: 1.6 (ref 1.1–2.5)
Albumin: 4.1 g/dL (ref 3.6–4.8)
Alkaline Phosphatase: 113 IU/L (ref 39–117)
BUN/Creatinine Ratio: 12 (ref 11–26)
BUN: 11 mg/dL (ref 8–27)
Bilirubin Total: 0.3 mg/dL (ref 0.0–1.2)
CO2: 26 mmol/L (ref 18–29)
Calcium: 9 mg/dL (ref 8.7–10.3)
Chloride: 101 mmol/L (ref 97–108)
Creatinine, Ser: 0.92 mg/dL (ref 0.57–1.00)
GFR calc Af Amer: 78 mL/min/{1.73_m2} (ref >59)
GFR calc non Af Amer: 67 mL/min/{1.73_m2} (ref >59)
Globulin, Total: 2.5 g/dL (ref 1.5–4.5)
Glucose: 92 mg/dL (ref 65–99)
Potassium: 4.5 mmol/L (ref 3.5–5.2)
Sodium: 140 mmol/L (ref 134–144)
Total Protein: 6.6 g/dL (ref 6.0–8.5)

## 2014-11-04 LAB — THYROID PANEL WITH TSH
Free Thyroxine Index: 1.5 (ref 1.2–4.9)
T3 Uptake Ratio: 29 % (ref 24–39)
T4, Total: 5.1 ug/dL (ref 4.5–12.0)
TSH: 3.25 u[IU]/mL (ref 0.450–4.500)

## 2014-11-04 LAB — VITAMIN D 25 HYDROXY (VIT D DEFICIENCY, FRACTURES): Vit D, 25-Hydroxy: 12.7 ng/mL — ABNORMAL LOW (ref 30.0–100.0)

## 2014-11-05 LAB — PAP IG W/ RFLX HPV ASCU: PAP Smear Comment: 0

## 2014-11-06 ENCOUNTER — Encounter: Payer: Self-pay | Admitting: *Deleted

## 2014-12-02 ENCOUNTER — Ambulatory Visit (INDEPENDENT_AMBULATORY_CARE_PROVIDER_SITE_OTHER): Payer: BLUE CROSS/BLUE SHIELD

## 2014-12-02 ENCOUNTER — Encounter: Payer: Self-pay | Admitting: Nurse Practitioner

## 2014-12-02 ENCOUNTER — Ambulatory Visit (INDEPENDENT_AMBULATORY_CARE_PROVIDER_SITE_OTHER): Payer: BLUE CROSS/BLUE SHIELD | Admitting: Nurse Practitioner

## 2014-12-02 VITALS — BP 113/57 | HR 52 | Temp 97.8°F | Ht 65.0 in | Wt 212.2 lb

## 2014-12-02 DIAGNOSIS — M25561 Pain in right knee: Secondary | ICD-10-CM

## 2014-12-02 DIAGNOSIS — E669 Obesity, unspecified: Secondary | ICD-10-CM

## 2014-12-02 DIAGNOSIS — K589 Irritable bowel syndrome without diarrhea: Secondary | ICD-10-CM | POA: Diagnosis not present

## 2014-12-02 DIAGNOSIS — I10 Essential (primary) hypertension: Secondary | ICD-10-CM | POA: Diagnosis not present

## 2014-12-02 DIAGNOSIS — F3170 Bipolar disorder, currently in remission, most recent episode unspecified: Secondary | ICD-10-CM | POA: Diagnosis not present

## 2014-12-02 DIAGNOSIS — K219 Gastro-esophageal reflux disease without esophagitis: Secondary | ICD-10-CM | POA: Diagnosis not present

## 2014-12-02 DIAGNOSIS — F5102 Adjustment insomnia: Secondary | ICD-10-CM

## 2014-12-02 DIAGNOSIS — I4891 Unspecified atrial fibrillation: Secondary | ICD-10-CM

## 2014-12-02 MED ORDER — METOPROLOL TARTRATE 50 MG PO TABS: ORAL_TABLET | ORAL | Status: DC

## 2014-12-02 MED ORDER — OMEPRAZOLE 40 MG PO CPDR: 40.0000 mg | DELAYED_RELEASE_CAPSULE | Freq: Every day | ORAL | Status: DC

## 2014-12-02 NOTE — Progress Notes (Signed)
Subjective:    Patient ID: Doris Smith, female    DOB: 12-25-1950, 64 y.o.   MRN: [id number]   Patient here today for follow up of chronic medical problems.   Knee Pain  Incident onset: fell 2 months ago and knee has been sore every since. The incident occurred at home. There was no injury mechanism. The pain is present in the right knee. The quality of the pain is described as burning, aching and stabbing. The pain is at a severity of 8/10. The pain is severe. The pain has been constant since onset. Associated symptoms include a loss of motion. She reports no foreign bodies present. The symptoms are aggravated by movement. She has tried nothing for the symptoms.  GERD Omeprazole 40 mg daily. When taken daily, sx are under control but patient does not remember to take all the time. IBS Sees gastroenterologist every 3-6 months to manage. Prescribed Amitza and linzess but patient says that neither  has helped to control bloating and constipation. Sees GI several times a year Bipolar Disorder Sees psychologist every 6 weeks for management of bipolar. Moods are still very variable according to patient. Patient states she is still depressed and tired. Does take Ativan nightly but has difficulty staying sleep. Hypertension/A. Fib Currently taking Metoprolol and Aspirin daily. Sees cardiologist every 3-6 months. Has had recent episodes of A. Fib but did not need conversion. Chronic back and neck pain On cymbalta which helps some with her pain tolerance. depression On prozac- sees Chapman Moss for counseling- does not want to change meds   Review of Systems  Constitutional: Positive for fatigue.  HENT: Negative.   Respiratory: Negative for shortness of breath.   Cardiovascular: Negative for chest pain.  Genitourinary: Negative.   Musculoskeletal: Positive for myalgias.  Neurological: Negative.   Psychiatric/Behavioral: Positive for sleep disturbance. The patient is nervous/anxious.   All  other systems reviewed and are negative.      Objective:   Physical Exam  Constitutional: She is oriented to person, place, and time. She appears well-developed and well-nourished.  HENT:  Head: Normocephalic.  Right Ear: Hearing, tympanic membrane, external ear and ear canal normal.  Left Ear: Hearing, tympanic membrane, external ear and ear canal normal.  Nose: Nose normal.  Mouth/Throat: Uvula is midline and oropharynx is clear and moist.  Eyes: Conjunctivae and EOM are normal. Pupils are equal, round, and reactive to light.  Neck: Full passive range of motion without pain. Neck supple. No JVD present. Carotid bruit is not present. Decreased range of motion: with left rotation and extension. No thyroid mass and no thyromegaly present.  Cardiovascular: Normal rate, normal heart sounds and intact distal pulses.  A regularly irregular rhythm present.  No murmur heard. Pulmonary/Chest: Effort normal and breath sounds normal. Right breast exhibits no inverted nipple, no mass, no nipple discharge, no skin change and no tenderness. Left breast exhibits no inverted nipple, no mass, no nipple discharge, no skin change and no tenderness.  Abdominal: Soft. Bowel sounds are normal. She exhibits no mass. There is tenderness (Throughout all quadrants).  Genitourinary: No breast swelling, tenderness, discharge or bleeding.  Musculoskeletal:  FROM of right knee with pain on full extension and flexion No patella tenderness No effusion All ligaments intact  Lymphadenopathy:    She has no cervical adenopathy.  Neurological: She is alert and oriented to person, place, and time.  Skin: Skin is warm and dry.  Psychiatric: She has a normal mood and affect. Her behavior  is normal. Judgment and thought content normal.   BP 113/57 mmHg  Pulse 52  Temp(Src) 97.8 F (36.6 C) (Oral)  Ht _0  (1.651 m)  Wt 212 lb 3.2 oz (96.253 kg)  BMI 35.31 kg/m2  Right knee- mild OA laterally-Preliminary reading by  Ronnald Collum, FNP  [facility]       Assessment & Plan:  1. Essential hypertension Do not add salt to deit - CMP14+EGFR - NMR, lipoprofile  2. Atrial fibrillation, unspecified Avoid caffeiene - metoprolol (LOPRESSOR) 50 MG tablet; TAKE 1 TABLET (50 MG TOTAL) BY MOUTH 2 (TWO) TIMES DAILY.  Dispense: 60 tablet; Refill: 5  3. Irritable bowel syndrome Watch diet  4. Gastroesophageal reflux disease without esophagitis Avoid spicy foods Do not eat 2 hours prior to bedtime  - omeprazole (PRILOSEC) 40 MG capsule; Take 1 capsule (40 mg total) by mouth daily.  Dispense: 30 capsule; Refill: 5 5. Obesity (BMI 30-39.9) Discussed diet and exercise for person with BMI >25 Will recheck weight in 3-6 months   6. Insomnia due to stress Bedtime ritual  7. Bipolar disorder in partial remission, most recent episode unspecified type Take meds as rx  8. Right knee pain Moist heat  rest - DG Knee 1-2 Views Right; Future    Labs pending Health maintenance reviewed Diet and exercise encouraged Continue all meds Follow up  In 3 months   [place], FNP

## 2014-12-02 NOTE — Patient Instructions (Signed)

## 2014-12-28 ENCOUNTER — Ambulatory Visit (INDEPENDENT_AMBULATORY_CARE_PROVIDER_SITE_OTHER): Payer: BLUE CROSS/BLUE SHIELD | Admitting: Family Medicine

## 2014-12-28 ENCOUNTER — Ambulatory Visit (INDEPENDENT_AMBULATORY_CARE_PROVIDER_SITE_OTHER): Payer: BLUE CROSS/BLUE SHIELD

## 2014-12-28 ENCOUNTER — Encounter (INDEPENDENT_AMBULATORY_CARE_PROVIDER_SITE_OTHER): Payer: Self-pay

## 2014-12-28 ENCOUNTER — Encounter: Payer: Self-pay | Admitting: Family Medicine

## 2014-12-28 VITALS — BP 111/49 | HR 53 | Temp 97.7°F | Ht 65.0 in | Wt 214.0 lb

## 2014-12-28 DIAGNOSIS — R05 Cough: Secondary | ICD-10-CM

## 2014-12-28 DIAGNOSIS — R059 Cough, unspecified: Secondary | ICD-10-CM

## 2014-12-28 DIAGNOSIS — J029 Acute pharyngitis, unspecified: Secondary | ICD-10-CM

## 2014-12-28 DIAGNOSIS — J301 Allergic rhinitis due to pollen: Secondary | ICD-10-CM

## 2014-12-28 DIAGNOSIS — J310 Chronic rhinitis: Secondary | ICD-10-CM

## 2014-12-28 DIAGNOSIS — I700 Atherosclerosis of aorta: Secondary | ICD-10-CM | POA: Insufficient documentation

## 2014-12-28 DIAGNOSIS — J329 Chronic sinusitis, unspecified: Secondary | ICD-10-CM

## 2014-12-28 LAB — POCT CBC
Granulocyte percent: 64 %G (ref 37–80)
HCT, POC: 39.4 % (ref 37.7–47.9)
Hemoglobin: 12.4 g/dL (ref 12.2–16.2)
Lymph, poc: 1.5 (ref 0.6–3.4)
MCH, POC: 27.9 pg (ref 27–31.2)
MCHC: 31.4 g/dL — AB (ref 31.8–35.4)
MCV: 88.7 fL (ref 80–97)
MPV: 8.3 fL (ref 0–99.8)
POC Granulocyte: 3.6 (ref 2–6.9)
POC LYMPH PERCENT: 25.8 %L (ref 10–50)
Platelet Count, POC: 240 10*3/uL (ref 142–424)
RBC: 4.44 M/uL (ref 4.04–5.48)
RDW, POC: 13.8 %
WBC: 5.7 10*3/uL (ref 4.6–10.2)

## 2014-12-28 LAB — POCT RAPID STREP A (OFFICE): Rapid Strep A Screen: NEGATIVE

## 2014-12-28 MED ORDER — FLUTICASONE PROPIONATE 50 MCG/ACT NA SUSP: 2.0000 | Freq: Every day | NASAL | Status: DC

## 2014-12-28 MED ORDER — CEFDINIR 300 MG PO CAPS: 300.0000 mg | ORAL_CAPSULE | Freq: Two times a day (BID) | ORAL | Status: DC

## 2014-12-28 NOTE — Progress Notes (Signed)
Subjective:    Patient ID: Doris Smith, female    DOB: [birth year], 64 y.o.   MRN: [id number]  HPI Pt here today for cough, sore throat, sinus congestion and SOB. She is also had some headaches and fatigue. This all started about 5 days ago. She has a history of hypertension, degenerative disc disease, bipolar disorder and allergic rhinitis. She is allergic to sulfa and codeine. The patient denies fever.        Patient Active Problem List   Diagnosis Date Noted  . Obesity (BMI 30-39.9) 04/09/2014  . Insomnia due to stress 04/09/2014  . Atrial fibrillation 10/01/2013  . GERD (gastroesophageal reflux disease) 10/01/2013  . Abdominal pain, unspecified site 10/03/2012  . NAUSEA AND VOMITING 06/17/2008  . Bipolar disorder 09/27/2007  . Essential hypertension 09/27/2007  . ALLERGIC RHINITIS 09/27/2007  . IRRITABLE BOWEL SYNDROME 09/27/2007  . ARTHRITIS 09/27/2007  . DEGENERATIVE DISC DISEASE, LUMBOSACRAL SPINE 09/27/2007   Outpatient Encounter Prescriptions as of 12/28/2014  Medication Sig  . aspirin 325 MG tablet Take 325 mg by mouth daily.  Marland Kitchen gabapentin (NEURONTIN) 100 MG capsule Take 100 mg by mouth 4 (four) times daily.   Marland Kitchen lamoTRIgine (LAMICTAL) 100 MG tablet Take 200 mg by mouth 2 (two) times daily.   Marland Kitchen LORazepam (ATIVAN) 0.5 MG tablet Take 0.5 mg by mouth 2 (two) times daily as needed.  . metoprolol (LOPRESSOR) 50 MG tablet TAKE 1 TABLET (50 MG TOTAL) BY MOUTH 2 (TWO) TIMES DAILY.  Marland Kitchen omeprazole (PRILOSEC) 40 MG capsule Take 1 capsule (40 mg total) by mouth daily.  . [DISCONTINUED] DULoxetine (CYMBALTA) 60 MG capsule Take 60 mg by mouth 2 (two) times daily.   . [DISCONTINUED] Linaclotide (LINZESS) 145 MCG CAPS capsule Take 1 capsule (145 mcg total) by mouth daily.  . [DISCONTINUED] phentermine (ADIPEX-P) 37.5 MG tablet TAKE 1 TABLET BY MOUTH DAILY BEFORE BREAKFAST  . [DISCONTINUED] traMADol (ULTRAM) 50 MG tablet Take 1 tablet (50 mg total) by mouth every 8 (eight) hours as  needed.   No facility-administered encounter medications on file as of 12/28/2014.     Review of Systems  Constitutional: Positive for fatigue.  HENT: Positive for congestion, postnasal drip, sinus pressure and sore throat.   Eyes: Negative.   Respiratory: Positive for cough, chest tightness and shortness of breath.   Cardiovascular: Negative.   Gastrointestinal: Negative.   Endocrine: Negative.   Genitourinary: Negative.   Musculoskeletal: Negative.   Skin: Negative.   Allergic/Immunologic: Negative.   Neurological: Positive for headaches.  Hematological: Negative.   Psychiatric/Behavioral: Negative.        Objective:   Physical Exam  Constitutional: She is oriented to person, place, and time. She appears well-developed and well-nourished. No distress.  HENT:  Head: Normocephalic and atraumatic.  Right Ear: External ear normal.  Left Ear: External ear normal.  Mouth/Throat: Oropharynx is clear and moist. No oropharyngeal exudate.  There is nasal congestion bilaterally left greater than right  Eyes: Conjunctivae and EOM are normal. Pupils are equal, round, and reactive to light. Right eye exhibits no discharge. Left eye exhibits no discharge. No scleral icterus.  Neck: Normal range of motion. Neck supple. No thyromegaly present.  Anterior cervical tenderness bilaterally  Cardiovascular: Normal rate, regular rhythm and normal heart sounds.   No murmur heard. Pulmonary/Chest: Effort normal and breath sounds normal. No respiratory distress. She has no wheezes. She has no rales.  Dry cough  Musculoskeletal: Normal range of motion.  The patient uses a cane for  ambulation  Lymphadenopathy:    She has cervical adenopathy.  Neurological: She is alert and oriented to person, place, and time.  Skin: Skin is warm and dry. No rash noted.  Psychiatric: She has a normal mood and affect. Her behavior is normal. Judgment and thought content normal.  Nursing note and vitals  reviewed.  BP 111/49 mmHg  Pulse 53  Temp(Src) 97.7 F (36.5 C) (Oral)  Ht 5\' 5"  (1.651 m)  Wt 214 lb (97.07 kg)  BMI 35.61 kg/m2  Results for orders placed or performed in visit on 12/28/14  POCT rapid strep A  Result Value Ref Range   Rapid Strep A Screen Negative Negative  POCT CBC  Result Value Ref Range   WBC 5.7 4.6 - 10.2 K/uL   Lymph, poc 1.5 0.6 - 3.4   POC LYMPH PERCENT 25.8 10 - 50 %L   POC Granulocyte 3.6 2 - 6.9   Granulocyte percent 64.0 37 - 80 %G   RBC 4.44 4.04 - 5.48 M/uL   Hemoglobin 12.4 12.2 - 16.2 g/dL   HCT, POC 39.4 37.7 - 47.9 %   MCV 88.7 80 - 97 fL   MCH, POC 27.9 27 - 31.2 pg   MCHC 31.4 (A) 31.8 - 35.4 g/dL   RDW, POC 13.8 %   Platelet Count, POC 240 142 - 424 K/uL   MPV 8.3 0 - 99.8 fL   [facility] reading (PRIMARY) by  Dr. Brunilda Payor x-ray, no active disease, borderline cardiac enlargement                                      Assessment & Plan:  1. Sore throat -A throat culture is pending, take antibiotic as directed - POCT rapid strep A - Culture, Group A Strep - POCT CBC - cefdinir (OMNICEF) 300 MG capsule; Take 1 capsule (300 mg total) by mouth 2 (two) times daily.  Dispense: 20 capsule; Refill: 0  2. Cough -Take Mucinex 1 pill twice a day with a large glass of water - DG Chest 2 View; Future - POCT CBC  3. Allergic rhinitis due to pollen -C use nasal saline - fluticasone (FLONASE) 50 MCG/ACT nasal spray; Place 2 sprays into both nostrils daily.  Dispense: 16 g; Refill: 6  4. Rhinosinusitis -Take antibiotic until completed - cefdinir (OMNICEF) 300 MG capsule; Take 1 capsule (300 mg total) by mouth 2 (two) times daily.  Dispense: 20 capsule; Refill: 0  Patient Instructions  Take antibiotic as directed Use nasal saline frequently during the day Use Flonase nasal spray 1-2 sprays each nostril at bedtime Take Mucinex, maximum strength, blue and white in color, 1 twice daily with a large glass of water for cough and  congestion   Arrie Senate MD

## 2014-12-28 NOTE — Patient Instructions (Signed)
Take antibiotic as directed Use nasal saline frequently during the day Use Flonase nasal spray 1-2 sprays each nostril at bedtime Take Mucinex, maximum strength, blue and white in color, 1 twice daily with a large glass of water for cough and congestion

## 2014-12-29 ENCOUNTER — Telehealth: Payer: Self-pay | Admitting: Family Medicine

## 2014-12-29 NOTE — Telephone Encounter (Signed)
Pt notified of CXR results Verbalizes understanding 

## 2014-12-29 NOTE — Telephone Encounter (Signed)
-----   Message from Zannie Cove, LPN sent at [id number]  9:04 AM EDT -----   ----- Message -----    From: Chipper Herb, MD    Sent: 12/28/2014   7:31 PM      To: Zannie Cove, LPN, Carlon Chumley, Rad Tech, #  As per radiology report-----please mentioned borderline cardiomegaly and aortic atherosclerosis and make sure that she is getting her lipids checked regularly and following her diet

## 2014-12-30 LAB — CULTURE, GROUP A STREP: Strep A Culture: NEGATIVE

## 2014-12-31 ENCOUNTER — Telehealth: Payer: Self-pay | Admitting: Family Medicine

## 2014-12-31 NOTE — Telephone Encounter (Signed)
Stp and we were calling regarding her test results, pt aware to finish antibiotics she was rx'd at visit.

## 2015-01-29 ENCOUNTER — Telehealth: Payer: Self-pay | Admitting: Nurse Practitioner

## 2015-01-29 NOTE — Telephone Encounter (Signed)
Appt given per patients request 

## 2015-02-01 ENCOUNTER — Ambulatory Visit (INDEPENDENT_AMBULATORY_CARE_PROVIDER_SITE_OTHER): Payer: BLUE CROSS/BLUE SHIELD | Admitting: Family Medicine

## 2015-02-01 ENCOUNTER — Encounter: Payer: Self-pay | Admitting: Family Medicine

## 2015-02-01 VITALS — BP 98/43 | HR 51 | Temp 97.5°F | Ht 65.0 in | Wt 210.0 lb

## 2015-02-01 DIAGNOSIS — R531 Weakness: Secondary | ICD-10-CM

## 2015-02-01 DIAGNOSIS — H547 Unspecified visual loss: Secondary | ICD-10-CM

## 2015-02-01 DIAGNOSIS — R2681 Unsteadiness on feet: Secondary | ICD-10-CM

## 2015-02-01 DIAGNOSIS — R42 Dizziness and giddiness: Secondary | ICD-10-CM

## 2015-02-01 DIAGNOSIS — M25561 Pain in right knee: Secondary | ICD-10-CM | POA: Diagnosis not present

## 2015-02-01 LAB — POCT CBC
Granulocyte percent: 71.8 %G (ref 37–80)
HCT, POC: 38.5 % (ref 37.7–47.9)
Hemoglobin: 12.3 g/dL (ref 12.2–16.2)
Lymph, poc: 1.4 (ref 0.6–3.4)
MCH, POC: 27.8 pg (ref 27–31.2)
MCHC: 32 g/dL (ref 31.8–35.4)
MCV: 86.7 fL (ref 80–97)
MPV: 8.5 fL (ref 0–99.8)
POC Granulocyte: 5.1 (ref 2–6.9)
POC LYMPH PERCENT: 19.9 %L (ref 10–50)
Platelet Count, POC: 261 10*3/uL (ref 142–424)
RBC: 4.44 M/uL (ref 4.04–5.48)
RDW, POC: 13.6 %
WBC: 7.1 10*3/uL (ref 4.6–10.2)

## 2015-02-01 MED ORDER — MECLIZINE HCL 12.5 MG PO TABS: ORAL_TABLET | ORAL | Status: DC

## 2015-02-01 NOTE — Progress Notes (Signed)
Subjective:    Patient ID: Doris Smith, female    DOB: October 11, 1950, 64 y.o.   MRN: [id number]  HPI  Patient here today for dizziness, nausea and right knee pain. The patient has multiple complaints. She has problems with her vision and says that she needs to have her eyes checked and get new glasses and she plans to do this. She's had problems with an stomach that stays irritated with vomiting in the past. This time the dizziness and nausea and feeling like she has to throw up has been going on for about 3 weeks. She feels lightheaded and feels like she is going to pass out. Her recent x-rays were reviewed with her today and shows degenerative changes in the right knee. She has seen the orthopedic surgeon about the left knee in the past. The x-ray results were reviewed with the patient she was given a copy of the report and we will request that she has a visit scheduled with her orthopedist.      Patient Active Problem List   Diagnosis Date Noted  . Aortic atherosclerosis 12/28/2014  . Obesity (BMI 30-39.9) 04/09/2014  . Insomnia due to stress 04/09/2014  . Atrial fibrillation 10/01/2013  . GERD (gastroesophageal reflux disease) 10/01/2013  . Abdominal pain, unspecified site 10/03/2012  . NAUSEA AND VOMITING 06/17/2008  . Bipolar disorder 09/27/2007  . Essential hypertension 09/27/2007  . ALLERGIC RHINITIS 09/27/2007  . IRRITABLE BOWEL SYNDROME 09/27/2007  . ARTHRITIS 09/27/2007  . DEGENERATIVE DISC DISEASE, LUMBOSACRAL SPINE 09/27/2007   Outpatient Encounter Prescriptions as of 02/01/2015  Medication Sig  . aspirin 325 MG tablet Take 325 mg by mouth daily.  Marland Kitchen FLUoxetine (PROZAC) 20 MG capsule   . fluticasone (FLONASE) 50 MCG/ACT nasal spray Place 2 sprays into both nostrils daily.  Marland Kitchen gabapentin (NEURONTIN) 100 MG capsule Take 100 mg by mouth 4 (four) times daily.   Marland Kitchen lamoTRIgine (LAMICTAL) 100 MG tablet Take 200 mg by mouth 2 (two) times daily.   Marland Kitchen lamoTRIgine (LAMICTAL) 200 MG  tablet   . LORazepam (ATIVAN) 0.5 MG tablet Take 0.5 mg by mouth 2 (two) times daily as needed.  . metoprolol (LOPRESSOR) 50 MG tablet TAKE 1 TABLET (50 MG TOTAL) BY MOUTH 2 (TWO) TIMES DAILY.  Marland Kitchen omeprazole (PRILOSEC) 40 MG capsule Take 1 capsule (40 mg total) by mouth daily.  . [DISCONTINUED] cefdinir (OMNICEF) 300 MG capsule Take 1 capsule (300 mg total) by mouth 2 (two) times daily.   No facility-administered encounter medications on file as of 02/01/2015.     Review of Systems  Constitutional: Negative.   Eyes: Negative.   Respiratory: Negative.   Cardiovascular: Negative.   Gastrointestinal: Positive for nausea and vomiting.  Endocrine: Negative.   Genitourinary: Negative.   Musculoskeletal: Positive for arthralgias (right knee pain).  Skin: Negative.   Allergic/Immunologic: Negative.   Neurological: Positive for dizziness and light-headedness.  Hematological: Negative.   Psychiatric/Behavioral: Negative.        Objective:   Physical Exam  Constitutional: She is oriented to person, place, and time. She appears well-developed and well-nourished. No distress.  HENT:  Head: Normocephalic and atraumatic.  Right Ear: External ear normal.  Left Ear: External ear normal.  Nose: Nose normal.  Mouth/Throat: Oropharynx is clear and moist.  Eyes: Conjunctivae and EOM are normal. Pupils are equal, round, and reactive to light. Right eye exhibits no discharge. Left eye exhibits no discharge. No scleral icterus.  Neck: Normal range of motion. Neck supple. No thyromegaly  present.  No thyromegaly or carotid bruits  Cardiovascular: Normal rate, regular rhythm and normal heart sounds.   No murmur heard. Heart has a regular rate and rhythm at 60/m  Pulmonary/Chest: Effort normal and breath sounds normal. No respiratory distress. She has no wheezes. She has no rales. She exhibits no tenderness.  Musculoskeletal: Normal range of motion. She exhibits no edema or tenderness.  The patient uses  a cane because of the staggering nature of her gait and her dizziness. She also has pain in the right knee joint both internally and externally.  Lymphadenopathy:    She has no cervical adenopathy.  Neurological: She is alert and oriented to person, place, and time.  Skin: Skin is warm and dry. No rash noted.  Psychiatric: She has a normal mood and affect. Her behavior is normal. Judgment and thought content normal.  Nursing note and vitals reviewed.  BP 98/43 mmHg  Pulse 51  Temp(Src) 97.5 F (36.4 C) (Oral)  Ht _0  (1.651 m)  Wt 210 lb (95.255 kg)  BMI 34.95 kg/m2  Repeat blood pressure was 110/60 in the right arm       Assessment & Plan:  1. Dizziness and giddiness -Drink plenty of fluids and take meclizine as directed -Monitor blood pressures at home with an Omron cuff and if they run low less than 90 or less than 50 from the bottom number she should call us and we will plan to reduce the metoprolol at that time. -Keep appointment with cardiologist as planned - POCT CBC - meclizine (ANTIVERT) 12.5 MG tablet; Take one 3 times daily with food for the next 7-10 days  Dispense: 30 tablet; Refill: 0  2. Weakness -Drink plenty of fluids and take meclizine as directed - POCT CBC - BMP8+EGFR - meclizine (ANTIVERT) 12.5 MG tablet; Take one 3 times daily with food for the next 7-10 days  Dispense: 30 tablet; Refill: 0 - Ambulatory referral to Orthopedic Surgery  3. Gait instability -Take meclizine and monitor blood pressure as directed - POCT CBC - BMP8+EGFR - meclizine (ANTIVERT) 12.5 MG tablet; Take one 3 times daily with food for the next 7-10 days  Dispense: 30 tablet; Refill: 0 - Ambulatory referral to Orthopedic Surgery  4. Vision impairment -Schedule visit with ophthalmologist  5. Right knee pain -Schedule visit with [facility] orthopedic surgery - Ambulatory referral to Orthopedic Surgery  Meds ordered this encounter  Medications  . FLUoxetine (PROZAC) 20 MG  capsule    Sig:   . lamoTRIgine (LAMICTAL) 200 MG tablet    Sig:   . meclizine (ANTIVERT) 12.5 MG tablet    Sig: Take one 3 times daily with food for the next 7-10 days    Dispense:  30 tablet    Refill:  0   Patient Instructions  Please arrange to get her eyes examined because of the blurred vision We will arrange for you to have an appointment with Dr. Veverly Fells to follow-up on the right knee pain and arthritis Take the meclizine regularly for 1 week--take one 3 times daily with each meal Check blood pressures using an Omron blood pressure monitor and call us with those results in the next week If the blood pressures run in the 90s or 80s for the top number or less than 50 for the bottom number at home and call us sooner and we will reduce the metoprolol Continue to drink plenty of fluids   Arrie Senate MD

## 2015-02-01 NOTE — Patient Instructions (Signed)
Please arrange to get her eyes examined because of the blurred vision We will arrange for you to have an appointment with Dr. Veverly Fells to follow-up on the right knee pain and arthritis Take the meclizine regularly for 1 week--take one 3 times daily with each meal Check blood pressures using an Omron blood pressure monitor and call us with those results in the next week If the blood pressures run in the 90s or 80s for the top number or less than 50 for the bottom number at home and call us sooner and we will reduce the metoprolol Continue to drink plenty of fluids

## 2015-02-02 LAB — BMP8+EGFR
BUN/Creatinine Ratio: 13 (ref 11–26)
BUN: 13 mg/dL (ref 8–27)
CO2: 24 mmol/L (ref 18–29)
Calcium: 8.8 mg/dL (ref 8.7–10.3)
Chloride: 104 mmol/L (ref 97–108)
Creatinine, Ser: 0.98 mg/dL (ref 0.57–1.00)
GFR calc Af Amer: 72 mL/min/{1.73_m2} (ref >59)
GFR calc non Af Amer: 62 mL/min/{1.73_m2} (ref >59)
Glucose: 98 mg/dL (ref 65–99)
Potassium: 4.4 mmol/L (ref 3.5–5.2)
Sodium: 144 mmol/L (ref 134–144)

## 2015-02-11 ENCOUNTER — Ambulatory Visit: Payer: BLUE CROSS/BLUE SHIELD | Admitting: Nurse Practitioner

## 2015-02-19 ENCOUNTER — Telehealth: Payer: Self-pay | Admitting: Family Medicine

## 2015-03-01 ENCOUNTER — Encounter: Payer: Self-pay | Admitting: Family Medicine

## 2015-03-01 ENCOUNTER — Ambulatory Visit (INDEPENDENT_AMBULATORY_CARE_PROVIDER_SITE_OTHER): Payer: BLUE CROSS/BLUE SHIELD | Admitting: Family Medicine

## 2015-03-01 VITALS — BP 120/54 | HR 51 | Temp 97.2°F | Ht 65.0 in | Wt 216.4 lb

## 2015-03-01 DIAGNOSIS — M25561 Pain in right knee: Secondary | ICD-10-CM | POA: Diagnosis not present

## 2015-03-01 NOTE — Assessment & Plan Note (Addendum)
Xr consistent with OA, Some concern on physical exam for Meniscal injury  Xr also with concern for osteochondritis dissecans, although I believe this is an osteochondral lesion pointing to OA. Discussed knee injection today, however with concern for osteochondritis dissecans I believe the most prudent step is to refer to orthopedic surgery versus MRI, she would like referral back to her orthopedic surgeon who replaced her left knee previously. Referring [facility] orthopedics

## 2015-03-01 NOTE — Patient Instructions (Signed)
Great to meet you!  You should hear about an orthopedic surgeon within a week, if you don't please give [place] a call.

## 2015-03-01 NOTE — Progress Notes (Signed)
Patient ID: Doris Smith, female   DOB: 01/30/1951, 64 y.o.   MRN: [id number]   HPI  Patient presents today to discuss right knee pain  Patient explains that her right knee pain has been much more severe for the last 1-2 months. She states that she has has intermittent swelling and pain that seems to move around her right knee. The pain is worse after walking or any activity. She describes it as medial knee pain predominantly but also sometimes anterior, lateral, or superior knee pain. She has some relief with a compression sleeve. She states that the pain is very severe about one week ago and has improved since that time. She denies fevers, chills, sweats She would not like to have surgery.  Also concerned about weight gain and low energy She states that she seems to gain and lose weight without reason, she has a difficult time keeping weight off.  PMH: Smoking status noted ROS: Per HPI, otherwise per history of present illness  Objective: BP 120/54 mmHg  Pulse 51  Temp(Src) 97.2 F (36.2 C) (Oral)  Ht 5\' 5"  (1.651 m)  Wt 216 lb 6.4 oz (98.158 kg)  BMI 36.01 kg/m2 Gen: NAD, alert, cooperative with exam HEENT: NCAT Ext: No edema, warm Neuro: Alert and oriented, No gross deficits MSK: R knee without erythema, effusion, bruising, or gross deformity Medial joint line tenderness ligamentously intact to Lachman's and with varus and valgus stress.     Assessment and plan:  Right knee pain Xr consistent with OA, Some concern on physical exam for Meniscal injury  Xr also with concern for osteochondritis dissecans, although I believe this is an osteochondral lesion pointing to OA. Discussed knee injection today, however with concern for osteochondritis dissecans I believe the most prudent step is to refer to orthopedic surgery versus MRI, she would like referral back to her orthopedic surgeon who replaced her left knee previously. Referring [facility]    Orders  Placed This Encounter  Procedures  . Ambulatory referral to Orthopedic Surgery    Referral Priority:  Routine    Referral Type:  Surgical    Referral Reason:  Specialty Services Required    Requested Specialty:  Orthopedic Surgery    Number of Visits Requested:  [place], MD [facility] 03/01/2015, 9:54 AM

## 2015-03-19 ENCOUNTER — Ambulatory Visit (INDEPENDENT_AMBULATORY_CARE_PROVIDER_SITE_OTHER): Payer: BLUE CROSS/BLUE SHIELD

## 2015-03-19 ENCOUNTER — Ambulatory Visit (INDEPENDENT_AMBULATORY_CARE_PROVIDER_SITE_OTHER): Payer: BLUE CROSS/BLUE SHIELD | Admitting: Cardiology

## 2015-03-19 ENCOUNTER — Encounter: Payer: Self-pay | Admitting: Cardiology

## 2015-03-19 VITALS — BP 116/64 | HR 55 | Ht 65.0 in | Wt 216.0 lb

## 2015-03-19 DIAGNOSIS — R002 Palpitations: Secondary | ICD-10-CM

## 2015-03-19 DIAGNOSIS — I10 Essential (primary) hypertension: Secondary | ICD-10-CM | POA: Diagnosis not present

## 2015-03-19 DIAGNOSIS — I4891 Unspecified atrial fibrillation: Secondary | ICD-10-CM | POA: Diagnosis not present

## 2015-03-19 NOTE — Progress Notes (Signed)
Cardiology Office Note   Date:  03/19/2015   ID:  Doris Smith, Doris Smith 29-Jul-1950, MRN [id number]  PCP:  Kenn File, MD  Cardiologist:   Minus Breeding, MD   Chief Complaint  Patient presents with  . Palpitations      History of Present Illness: Doris Smith is a 63 y.o. female who presents for evaluation of palpitations. She apparently had a history of atrial fibrillation at [facility] in 2014 although I don't have these records.   She was not told that she needed any follow-up and hadn't been on any specific therapy for this. However, for about 3 months she's had some increasing palpitations. She really describes this as skipped beats or every second or third beat might be a skipped beat. She's not describing sustained tachypalpitations. Might last 5 minutes or so. She just overall doesn't feel well. Seems to have some orthostatic symptoms. She's had presyncope but no frank syncope.   She's not had any classic substernal chest pressure, neck or arm discomfort. She's not had any PND or orthopnea. She does describe swelling in her feet. She thinks her palpitations are getting worse over time.  Past Medical History  Diagnosis Date  . PONV (postoperative nausea and vomiting)   . Asthma 04/07/2011    dx  . Sleep apnea 04/2011     No CPAP  . GERD (gastroesophageal reflux disease)   . Headache(784.0)     migraines  . Anxiety     takes Ativan  . Mental disorder     dx bipolar  . Depression   . Fracture, ankle 10/2008    right and left  . Irritable bowel syndrome   . Fatty liver   . A-fib 11/2012  . Fibromyalgia     Past Surgical History  Procedure Laterality Date  . Tonsillectomy      as child  . Partial hysterectomy  1982  . Dilation and curettage of uterus  1981    abnormal pap  . Knee arthroscopy Left 2007    x 2  . Total knee arthroplasty Left 12/18/2005  . Lumbar laminectomy/decompression microdiscectomy  05/31/2011    Procedure: LUMBAR  LAMINECTOMY/DECOMPRESSION MICRODISCECTOMY;  Surgeon: Johnn Hai;  Location: [facility];  Service: Orthopedics;  Laterality: Left;  Decompression Lumbar four to five and  Lumbar five to Sacral one on Left  (X-Ray)     Current Outpatient Prescriptions  Medication Sig Dispense Refill  . aspirin 325 MG tablet Take 325 mg by mouth daily.    Marland Kitchen FLUoxetine (PROZAC) 20 MG capsule     . fluticasone (FLONASE) 50 MCG/ACT nasal spray Place 2 sprays into both nostrils daily. 16 g 6  . gabapentin (NEURONTIN) 100 MG capsule Take 100 mg by mouth 4 (four) times daily.     Marland Kitchen lamoTRIgine (LAMICTAL) 200 MG tablet Take 200 mg by mouth 2 (two) times daily.     Marland Kitchen LORazepam (ATIVAN) 0.5 MG tablet Take 0.5 mg by mouth 2 (two) times daily as needed.    . meclizine (ANTIVERT) 12.5 MG tablet Take one 3 times daily with food for the next 7-10 days 30 tablet 0  . metoprolol (LOPRESSOR) 50 MG tablet TAKE 1 TABLET (50 MG TOTAL) BY MOUTH 2 (TWO) TIMES DAILY. 60 tablet 5  . omeprazole (PRILOSEC) 40 MG capsule Take 1 capsule (40 mg total) by mouth daily. 30 capsule 5   No current facility-administered medications for this visit.    Allergies:  Iohexol; Codeine; Sulfonamide derivatives; and Vitamin b12    Social History:  The patient  reports that she has never smoked. She has never used smokeless tobacco. She reports that she does not drink alcohol or use illicit drugs.   Family History:  The patient's family history includes Anesthesia problems in her mother and sister; Colon polyps in her father; Heart disease in her father and mother; Heart failure in her mother; Hypertension in her sister.    ROS:  Please see the history of present illness.   Otherwise, review of systems are positive for decreased balance.   All other systems are reviewed and negative.    PHYSICAL EXAM: VS:  BP 116/64 mmHg  Pulse 55  Ht 5\' 5"  (1.651 m)  Wt 216 lb (97.977 kg)  BMI 35.94 kg/m2 , BMI Body mass index is 35.94 kg/(m^2). GENERAL:   Well appearing HEENT:  Pupils equal round and reactive, fundi not visualized, oral mucosa unremarkable NECK:  No jugular venous distention, waveform within normal limits, carotid upstroke brisk and symmetric, no bruits, no thyromegaly LYMPHATICS:  No cervical, inguinal adenopathy LUNGS:  Clear to auscultation bilaterally BACK:  No CVA tenderness CHEST:  Unremarkable HEART:  PMI not displaced or sustained,S1 and S2 within normal limits, no S3, no S4, no clicks, no rubs, no murmurs ABD:  Flat, positive bowel sounds normal in frequency in pitch, no bruits, no rebound, no guarding, no midline pulsatile mass, no hepatomegaly, no splenomegaly EXT:  2 plus pulses throughout, no edema, no cyanosis no clubbing SKIN:  No rashes no nodules NEURO:  Cranial nerves II through XII grossly intact, motor grossly intact throughout PSYCH:  Cognitively intact, oriented to person place and time    EKG:  EKG is ordered today. The ekg ordered today demonstrates  Sinus rhythm with premature atrial contractions , axis within normal limits, intervals within normal limits, no acute ST-T wave changes.   Recent Labs: 11/03/2014: ALT 10; TSH 3.250 02/01/2015: BUN 13; Creatinine, Ser 0.98; Hemoglobin 12.3; Potassium 4.4; Sodium 144    Lipid Panel    Component Value Date/Time   CHOL 202* 11/03/2014 1119   TRIG 114 11/03/2014 1119   HDL 82 11/03/2014 1119   LDLCALC 94 10/01/2013 0926      Wt Readings from Last 3 Encounters:  03/19/15 216 lb (97.977 kg)  03/01/15 216 lb 6.4 oz (98.158 kg)  02/01/15 210 lb (95.255 kg)    Lab Results  Component Value Date   TSH 3.250 11/03/2014     Other studies Reviewed: Additional studies/ records that were reviewed today include: Labs from Dr. Laurance Flatten.   ASSESSMENT AND PLAN:  PALPITATIONS:   I did review labs and she had normal thyroid and electrolytes.   I'm going to get the records from the outside hospital to see if this was documented atrial fibrillation. I'm  going to apply a 48 hour Holter monitor. Given her complaints of edema as well as this I will also check an echocardiogram. Further evaluation will be based on these results.  ATRIAL FIB:  As above,  I will be asking for outside records.   EDEMA:  This will be evaluated with the echo.  For now I will not change her meds.    Current medicines are reviewed at length with the patient today.  The patient does not have concerns regarding medicines.  The following changes have been made:  no change  Labs/ tests ordered today include:   Orders Placed This Encounter  Procedures  .  Holter monitor - 48 hour  . EKG 12-Lead  . ECHOCARDIOGRAM COMPLETE     Disposition:   FU with me in 2 months.     Signed, Minus Breeding, MD  03/19/2015 1:21 PM    [facility]

## 2015-03-19 NOTE — Patient Instructions (Signed)
Your physician recommends that you schedule a follow-up appointment in: 2 Months  Your physician has recommended that you wear a holter monitor. Holter monitors are medical devices that record the heart's electrical activity. Doctors most often use these monitors to diagnose arrhythmias. Arrhythmias are problems with the speed or rhythm of the heartbeat. The monitor is a small, portable device. You can wear one while you do your normal daily activities. This is usually used to diagnose what is causing palpitations/syncope (passing out). Doris Smith has requested that you have an echocardiogram. Echocardiography is a painless test that uses sound waves to create images of your heart. It provides your doctor with information about the size and shape of your heart and how well your heart's chambers and valves are working. This procedure takes approximately one hour. There are no restrictions for this procedure.

## 2015-03-22 ENCOUNTER — Ambulatory Visit: Payer: Self-pay | Admitting: Cardiology

## 2015-03-24 ENCOUNTER — Ambulatory Visit: Payer: BLUE CROSS/BLUE SHIELD | Admitting: Nurse Practitioner

## 2015-03-24 ENCOUNTER — Ambulatory Visit (INDEPENDENT_AMBULATORY_CARE_PROVIDER_SITE_OTHER): Payer: BLUE CROSS/BLUE SHIELD | Admitting: Nurse Practitioner

## 2015-03-24 ENCOUNTER — Encounter: Payer: Self-pay | Admitting: Nurse Practitioner

## 2015-03-24 VITALS — BP 90/43 | HR 50 | Temp 97.0°F | Ht 65.0 in | Wt 214.8 lb

## 2015-03-24 DIAGNOSIS — R531 Weakness: Secondary | ICD-10-CM

## 2015-03-24 DIAGNOSIS — K219 Gastro-esophageal reflux disease without esophagitis: Secondary | ICD-10-CM

## 2015-03-24 DIAGNOSIS — I4891 Unspecified atrial fibrillation: Secondary | ICD-10-CM

## 2015-03-24 DIAGNOSIS — M545 Low back pain, unspecified: Secondary | ICD-10-CM

## 2015-03-24 DIAGNOSIS — F5102 Adjustment insomnia: Secondary | ICD-10-CM

## 2015-03-24 DIAGNOSIS — M6289 Other specified disorders of muscle: Secondary | ICD-10-CM | POA: Diagnosis not present

## 2015-03-24 DIAGNOSIS — I10 Essential (primary) hypertension: Secondary | ICD-10-CM | POA: Diagnosis not present

## 2015-03-24 DIAGNOSIS — F3170 Bipolar disorder, currently in remission, most recent episode unspecified: Secondary | ICD-10-CM

## 2015-03-24 MED ORDER — CYCLOBENZAPRINE HCL 10 MG PO TABS: 10.0000 mg | ORAL_TABLET | Freq: Three times a day (TID) | ORAL | Status: DC | PRN

## 2015-03-24 NOTE — Patient Instructions (Signed)

## 2015-03-24 NOTE — Progress Notes (Signed)
Subjective:    Patient ID: Doris Smith, female    DOB: 06-01-1951, 64 y.o.   MRN: [id number]   Patient here today for follow up of chronic medical problems.  * Has back pain that started 1 week ago and would like muscle relaxor- rates pain 3/10- She also has a friend with MS and she wants referral to determine if she may have ms due to her constant pain and fatigue.  Hyperlipidemia This is a chronic problem. The problem is uncontrolled. Recent lipid tests were reviewed and are variable. Associated symptoms include myalgias. Current antihyperlipidemic treatment includes statins. The current treatment provides moderate improvement of lipids. Compliance problems include adherence to exercise.  Risk factors for coronary artery disease include dyslipidemia, hypertension, obesity and post-menopausal.  GERD Omeprazole 40 mg daily. When taken daily, sx are under control but patient does not remember to take all the time. IBS Sees gastroenterologist every 3-6 months to manage. Prescribed Amitza which has helped to control bloating and constipation. Bipolar Disorder Sees psychologist every 6 weeks for management of bipolar. Moods are still very variable according to patient. Patient states she is still depressed and tired. Does take Ativan nightly but has difficulty staying sleep. Hypertension/A. Fib Currently taking Metoprolol and Aspirin daily. Sees cardiologist every 3-6 months. Has had recent episodes of A. Fib but did not need conversion. Some blood pressures running low- Having U/S of heart next week Chronic back and neck pain On cymbalta which helps some with her pain tolerance.    Review of Systems  Constitutional: Positive for fatigue.  HENT: Negative.   Respiratory: Negative.   Gastrointestinal: Negative.   Genitourinary: Negative.   Musculoskeletal: Positive for myalgias.  Neurological: Negative.   Psychiatric/Behavioral: Positive for sleep disturbance. The patient is  nervous/anxious.   All other systems reviewed and are negative.      Objective:   Physical Exam  Constitutional: She is oriented to person, place, and time. She appears well-developed and well-nourished.  HENT:  Head: Normocephalic.  Right Ear: External ear normal.  Left Ear: External ear normal.  Mouth/Throat: Oropharynx is clear and moist.  Eyes: Pupils are equal, round, and reactive to light.  Neck: Neck supple. Decreased range of motion (with left rotation and extension) present.  Cardiovascular: A regularly irregular rhythm present.  Pulmonary/Chest: Effort normal and breath sounds normal.  Abdominal: There is tenderness (Throughout all quadrants).  Musculoskeletal:  Left sided upper and lower ext weakness  Neurological: She is alert and oriented to person, place, and time. She has normal reflexes. No cranial nerve deficit.  Skin: Skin is warm and dry.  Psychiatric: She has a normal mood and affect. Her behavior is normal. Judgment and thought content normal.   BP 90/43 mmHg  Pulse 50  Temp(Src) 97 F (36.1 C) (Oral)  Ht '5\' 5"'  (1.651 m)  Wt 214 lb 12.8 oz (97.433 kg)  BMI 35.74 kg/m2      Assessment & Plan:   1. Essential hypertension Do not add salt t o deit - CMP14+EGFR - Lipid panel  2. Atrial fibrillation, unspecified Keep follow up appointment with Dr. Warren Lacy  3. Gastroesophageal reflux disease without esophagitis Avoid spicy foods Do not eat 2 hours prior to bedtime   4. Bipolar disorder in partial remission, most recent episode unspecified type   5. Morbid obesity Discussed diet and exercise for person with BMI >25 Will recheck weight in 3-6 months   6. Insomnia due to stress Bedtime ritual  7. Bilateral low  back pain without sciatica Moist heat Rest No lifting - cyclobenzaprine (FLEXERIL) 10 MG tablet; Take 1 tablet (10 mg total) by mouth 3 (three) times daily as needed for muscle spasms.  Dispense: 30 tablet; Refill: 1  8. Right sided  weakness - Ambulatory referral to Neurology    Labs pending Health maintenance reviewed Diet and exercise encouraged Continue all meds Follow up  In 6 months   [place], FNP

## 2015-03-25 ENCOUNTER — Telehealth: Payer: Self-pay | Admitting: Cardiology

## 2015-03-25 LAB — CMP14+EGFR
ALT: 11 IU/L (ref 0–32)
AST: 12 IU/L (ref 0–40)
Albumin/Globulin Ratio: 1.7 (ref 1.1–2.5)
Albumin: 3.9 g/dL (ref 3.6–4.8)
Alkaline Phosphatase: 113 IU/L (ref 39–117)
BUN/Creatinine Ratio: 13 (ref 11–26)
BUN: 13 mg/dL (ref 8–27)
Bilirubin Total: 0.2 mg/dL (ref 0.0–1.2)
CO2: 26 mmol/L (ref 18–29)
Calcium: 9.1 mg/dL (ref 8.7–10.3)
Chloride: 100 mmol/L (ref 97–108)
Creatinine, Ser: 1 mg/dL (ref 0.57–1.00)
GFR calc Af Amer: 70 mL/min/{1.73_m2} (ref >59)
GFR calc non Af Amer: 61 mL/min/{1.73_m2} (ref >59)
Globulin, Total: 2.3 g/dL (ref 1.5–4.5)
Glucose: 96 mg/dL (ref 65–99)
Potassium: 4.5 mmol/L (ref 3.5–5.2)
Sodium: 140 mmol/L (ref 134–144)
Total Protein: 6.2 g/dL (ref 6.0–8.5)

## 2015-03-25 LAB — LIPID PANEL
Chol/HDL Ratio: 2.3 ratio units (ref 0.0–4.4)
Cholesterol, Total: 174 mg/dL (ref 100–199)
HDL: 77 mg/dL (ref >39)
LDL Calculated: 80 mg/dL (ref 0–99)
Triglycerides: 86 mg/dL (ref 0–149)
VLDL Cholesterol Cal: 17 mg/dL (ref 5–40)

## 2015-03-25 NOTE — Telephone Encounter (Signed)
Faxed Release signed by patient to [facility] to obtain records per Dr Percival Spanish request.  Faxed on 03/25/15 to [phone]. lp

## 2015-03-26 ENCOUNTER — Other Ambulatory Visit: Payer: Self-pay

## 2015-03-26 ENCOUNTER — Ambulatory Visit (HOSPITAL_COMMUNITY): Payer: BLUE CROSS/BLUE SHIELD | Attending: Cardiovascular Disease

## 2015-03-26 DIAGNOSIS — R002 Palpitations: Secondary | ICD-10-CM | POA: Diagnosis not present

## 2015-03-26 DIAGNOSIS — I4891 Unspecified atrial fibrillation: Secondary | ICD-10-CM | POA: Insufficient documentation

## 2015-03-26 DIAGNOSIS — I517 Cardiomegaly: Secondary | ICD-10-CM | POA: Diagnosis not present

## 2015-04-05 ENCOUNTER — Encounter: Payer: Self-pay | Admitting: *Deleted

## 2015-05-21 ENCOUNTER — Ambulatory Visit (INDEPENDENT_AMBULATORY_CARE_PROVIDER_SITE_OTHER): Payer: BLUE CROSS/BLUE SHIELD | Admitting: Cardiology

## 2015-05-21 ENCOUNTER — Encounter: Payer: Self-pay | Admitting: Cardiology

## 2015-05-21 VITALS — BP 134/70 | HR 48 | Ht 65.0 in | Wt 214.0 lb

## 2015-05-21 DIAGNOSIS — R002 Palpitations: Secondary | ICD-10-CM | POA: Diagnosis not present

## 2015-05-21 NOTE — Patient Instructions (Signed)
Your physician wants you to follow-up in: 1 Year. You will receive a reminder letter in the mail two months in advance. If you don't receive a letter, please call our office to schedule the follow-up appointment.  

## 2015-05-21 NOTE — Progress Notes (Signed)
Cardiology Office Note   Date:  05/21/2015   ID:  Doris Smith, DOB [birth year], MRN [id number]  PCP:  Chevis Pretty, FNP  Cardiologist:   Minus Breeding, MD   Chief Complaint  Patient presents with  . 2 months      History of Present Illness: Doris Smith is a 64 y.o. female who presents for evaluation of palpitations. She apparently had a history of atrial fibrillation at [facility] in 2014 although I don't have these records and was never able to get them.   At the last visit she did wear an event monitor which demonstrated no fibrillation although she did have premature atrial and ventricular contractions. Holter monitor was unremarkable. She still having palpitations. They might last for an hour at a time. He seems to happen when she's not slept well emotionally stressed.   Echocardiography was normal.  Past Medical History  Diagnosis Date  . PONV (postoperative nausea and vomiting)   . Asthma 04/07/2011    dx  . Sleep apnea 04/2011     No CPAP  . GERD (gastroesophageal reflux disease)   . Headache(784.0)     migraines  . Anxiety     takes Ativan  . Mental disorder     dx bipolar  . Depression   . Fracture, ankle 10/2008    right and left  . Irritable bowel syndrome   . Fatty liver   . A-fib ([place]) 11/2012  . Fibromyalgia     Past Surgical History  Procedure Laterality Date  . Tonsillectomy      as child  . Partial hysterectomy  1982  . Dilation and curettage of uterus  1981    abnormal pap  . Knee arthroscopy Left 2007    x 2  . Total knee arthroplasty Left 12/18/2005  . Lumbar laminectomy/decompression microdiscectomy  05/31/2011    Procedure: LUMBAR LAMINECTOMY/DECOMPRESSION MICRODISCECTOMY;  Surgeon: Johnn Hai;  Location: [facility];  Service: Orthopedics;  Laterality: Left;  Decompression Lumbar four to five and  Lumbar five to Sacral one on Left  (X-Ray)     Current Outpatient Prescriptions  Medication Sig Dispense Refill  .  aspirin 325 MG tablet Take 325 mg by mouth daily.    . cyclobenzaprine (FLEXERIL) 10 MG tablet Take 1 tablet (10 mg total) by mouth 3 (three) times daily as needed for muscle spasms. 30 tablet 1  . FLUoxetine (PROZAC) 20 MG capsule Take 20 mg by mouth daily.     . fluticasone (FLONASE) 50 MCG/ACT nasal spray Place 2 sprays into both nostrils daily. 16 g 6  . gabapentin (NEURONTIN) 100 MG capsule Take 100 mg by mouth 4 (four) times daily.     Marland Kitchen lamoTRIgine (LAMICTAL) 200 MG tablet Take 200 mg by mouth 2 (two) times daily.     Marland Kitchen LORazepam (ATIVAN) 0.5 MG tablet Take 0.5 mg by mouth 2 (two) times daily as needed.    . metoprolol (LOPRESSOR) 50 MG tablet TAKE 1 TABLET (50 MG TOTAL) BY MOUTH 2 (TWO) TIMES DAILY. 60 tablet 5  . omeprazole (PRILOSEC) 40 MG capsule Take 1 capsule (40 mg total) by mouth daily. 30 capsule 5   No current facility-administered medications for this visit.    Allergies:   Iohexol; Codeine; Sulfonamide derivatives; and Vitamin b12    ROS:  Please see the history of present illness.   Otherwise, review of systems are positive for decreased balance.   All other systems are reviewed  and negative.    PHYSICAL EXAM: VS:  BP 134/70 mmHg  Pulse 48  Ht 5\' 5"  (1.651 m)  Wt 214 lb (97.07 kg)  BMI 35.61 kg/m2 , BMI Body mass index is 35.61 kg/(m^2). GENERAL:  Well appearing HEENT:  Pupils equal round and reactive, fundi not visualized, oral mucosa unremarkable NECK:  No jugular venous distention, waveform within normal limits, carotid upstroke brisk and symmetric, no bruits, no thyromegaly LUNGS:  Clear to auscultation bilaterally BACK:  No CVA tenderness CHEST:  Unremarkable HEART:  PMI not displaced or sustained,S1 and S2 within normal limits, no S3, no S4, no clicks, no rubs, no murmurs ABD:  Flat, positive bowel sounds normal in frequency in pitch, no bruits, no rebound, no guarding, no midline pulsatile mass, no hepatomegaly, no splenomegaly EXT:  2 plus pulses  throughout, no edema, no cyanosis no clubbing   EKG:  EKG is not ordered today.    Recent Labs: 11/03/2014: TSH 3.250 02/01/2015: Hemoglobin 12.3 03/24/2015: ALT 11; BUN 13; Creatinine, Ser 1.00; Potassium 4.5; Sodium 140    Lipid Panel    Component Value Date/Time   CHOL 174 03/24/2015 1614   CHOL 202* 11/03/2014 1119   TRIG 86 03/24/2015 1614   TRIG 114 11/03/2014 1119   HDL 77 03/24/2015 1614   HDL 82 11/03/2014 1119   CHOLHDL 2.3 03/24/2015 1614   LDLCALC 80 03/24/2015 1614   LDLCALC 94 10/01/2013 0926      Wt Readings from Last 3 Encounters:  05/21/15 214 lb (97.07 kg)  03/24/15 214 lb 12.8 oz (97.433 kg)  03/19/15 216 lb (97.977 kg)    Lab Results  Component Value Date   TSH 3.250 11/03/2014     Other studies Reviewed: Additional studies/ records that were reviewed today include: None   ASSESSMENT AND PLAN:  PALPITATIONS:    For now we will treat her with additional when necessary metoprolol if she has palpitations but she'll let me know if this does not work. I don't think any further evaluation is indicated.   Current medicines are reviewed at length with the patient today.  The patient does not have concerns regarding medicines.  The following changes have been made:  no change  Labs/ tests ordered today include:   No orders of the defined types were placed in this encounter.     Disposition:   FU with me in 12 months.     Signed, Minus Breeding, MD  05/21/2015 11:10 AM    [place]

## 2015-06-05 ENCOUNTER — Other Ambulatory Visit: Payer: Self-pay | Admitting: Nurse Practitioner

## 2015-07-01 ENCOUNTER — Other Ambulatory Visit: Payer: Self-pay | Admitting: Nurse Practitioner

## 2015-07-14 ENCOUNTER — Ambulatory Visit (INDEPENDENT_AMBULATORY_CARE_PROVIDER_SITE_OTHER): Payer: BLUE CROSS/BLUE SHIELD | Admitting: Nurse Practitioner

## 2015-07-14 ENCOUNTER — Encounter: Payer: Self-pay | Admitting: Nurse Practitioner

## 2015-07-14 VITALS — BP 110/55 | HR 50 | Temp 97.2°F | Ht 65.0 in | Wt 222.0 lb

## 2015-07-14 DIAGNOSIS — M79602 Pain in left arm: Secondary | ICD-10-CM | POA: Diagnosis not present

## 2015-07-14 MED ORDER — MELOXICAM 15 MG PO TABS: 15.0000 mg | ORAL_TABLET | Freq: Every day | ORAL | Status: DC

## 2015-07-14 NOTE — Progress Notes (Signed)
   Subjective:    Patient ID: Doris Smith, female    DOB: 06-10-1951, 65 y.o.   MRN: [id number]  HPI Patient in today c/o left arm pain- describes the pain aching pressure like pain- movement makes pain worse - nothing really makes it better it just eases up on it's own. She has numbness from shoulder to hand at times and not always in conjunction with pain.  * she was seen by cardiologist in November and heart was good then.   Review of Systems  HENT: Negative.   Respiratory: Negative for shortness of breath.   Cardiovascular: Negative for chest pain, palpitations and leg swelling.  Gastrointestinal: Negative.   Genitourinary: Negative.   Neurological: Negative.   Psychiatric/Behavioral: Negative.   All other systems reviewed and are negative.      Objective:   Physical Exam  Constitutional: She is oriented to person, place, and time. She appears well-developed and well-nourished. No distress.  Cardiovascular: Normal rate, regular rhythm and normal heart sounds.   Pulmonary/Chest: Effort normal and breath sounds normal.  Musculoskeletal:  From of left shoulder with pain on internal rotation and abduction Point tenderness along left bicep Grips equal bil Sensation intact  Neurological: She is alert and oriented to person, place, and time.  Skin: Skin is warm and dry.  Psychiatric: She has a normal mood and affect. Her behavior is normal. Judgment and thought content normal.   BP 110/55 mmHg  Pulse 50  Temp(Src) 97.2 F (36.2 C) (Oral)  Ht 5\' 5"  (1.651 m)  Wt 222 lb (100.699 kg)  BMI 36.94 kg/m2  EKG- sinus Doris Grain, FNP      Assessment & Plan:  1. Left arm pain Moist heat Rest arm - EKG 12-Lead - Ambulatory referral to Orthopedic Surgery - meloxicam (MOBIC) 15 MG tablet; Take 1 tablet (15 mg total) by mouth daily.  Dispense: 30 tablet; Refill: 0  [name] Done, FNP

## 2015-08-07 ENCOUNTER — Other Ambulatory Visit: Payer: Self-pay | Admitting: Nurse Practitioner

## 2015-08-11 ENCOUNTER — Other Ambulatory Visit: Payer: Self-pay | Admitting: *Deleted

## 2015-08-11 DIAGNOSIS — M79602 Pain in left arm: Secondary | ICD-10-CM

## 2015-08-11 MED ORDER — METOPROLOL TARTRATE 50 MG PO TABS: ORAL_TABLET | ORAL | Status: DC

## 2015-08-11 MED ORDER — CYCLOBENZAPRINE HCL 10 MG PO TABS: ORAL_TABLET | ORAL | Status: DC

## 2015-08-11 MED ORDER — MELOXICAM 15 MG PO TABS: 15.0000 mg | ORAL_TABLET | Freq: Every day | ORAL | Status: DC

## 2015-08-20 ENCOUNTER — Ambulatory Visit (INDEPENDENT_AMBULATORY_CARE_PROVIDER_SITE_OTHER): Payer: BLUE CROSS/BLUE SHIELD | Admitting: Pharmacist

## 2015-08-20 ENCOUNTER — Encounter: Payer: Self-pay | Admitting: Pharmacist

## 2015-08-20 VITALS — BP 114/60 | HR 68 | Ht 65.0 in | Wt 228.0 lb

## 2015-08-20 DIAGNOSIS — E669 Obesity, unspecified: Secondary | ICD-10-CM | POA: Diagnosis not present

## 2015-08-20 DIAGNOSIS — E559 Vitamin D deficiency, unspecified: Secondary | ICD-10-CM

## 2015-08-20 DIAGNOSIS — F32A Depression, unspecified: Secondary | ICD-10-CM

## 2015-08-20 DIAGNOSIS — F329 Major depressive disorder, single episode, unspecified: Secondary | ICD-10-CM

## 2015-08-20 DIAGNOSIS — R5383 Other fatigue: Secondary | ICD-10-CM

## 2015-08-20 NOTE — Patient Instructions (Signed)
Recommend 1500 calories per day: Breakfast - 300 calories Lunch - 400 calories Supper - 500 calories  Snack - 100 calories (up to 3 a day)   Look for Calorie [employer] book or My Fitness Pal app for phone or tablet.

## 2015-08-20 NOTE — Progress Notes (Signed)
Subjective:     Doris Smith is a 65 y.o. female here for discussion regarding weight loss. I saw patient about 1 year ago and discussed diet and weight loss.  She has not been adherant to a low calorie diet due to stress and complication related to depression. She feels that she needs a review of healthy foods.   She feels ideal weight is 175 pounds. Weight at graduation from high school was 140 pounds. History of eating disorders: none. There is a family history positive for obesity in the patient, mother and sister. Previous treatments for obesity include commercial weight loss program: [employer], prescription appetite suppressants: phenteramine and self-directed dieting.  She has taken phentermine in past but in 2014 patient had episode of Afib at [facility].  She is currently under the care of Dr Jenkins Rouge.  Obesity associated medical conditions: depression, hyperlipidemia, pulmonary disease and sleep apnea.  Obesity associated medications: lamictal, gabapentin Cardiovascular risk factors besides obesity: advanced age (older than 27 for men, 75 for women), dyslipidemia, family history of premature cardiovascular disease, obesity (BMI >= 30 kg/m2) and sedentary lifestyle.  Diet- sometimes eats healthy - salad, vegetables  She and her husband eat out most nights.  Snacks on "whatever I can find"  The following portions of the patient's history were reviewed and updated as appropriate: allergies, current medications, past family history, past medical history, past social history, past surgical history and problem list.   Objective:    Body mass index is 37.94 kg/(m^2).  Filed Weights   08/20/15 1247  Weight: 228 lb (103.42 kg)   Filed Vitals:   08/20/15 1247  BP: 114/60  Pulse: 68     Assessment:    Obesity. I assessed Kerriann to be in an action stage with respect to weight loss Low vitamin D    Plan:    1.  General weight loss/lifestyle modification  strategies discussed (elicit support from others; identify saboteurs; non-food rewards, etc). 2.  Behavioral treatment: stress management. 3.  Diet interventions: moderate (500 kCal/d) deficit diet. Gave handout of meal ideas and recipes 4.  Informal exercise measures discussed, e.g. taking stairs instead of elevator.  Also recommended she try walking 5 min daily and working up as able. 5.   Orders Placed This Encounter  Procedures  . Thyroid Panel With TSH  . VITAMIN D 25 Hydroxy (Vit-D Deficiency, Fractures)   6.  RTC in 4 weeks  Cherre Robins, PharmD, CPP

## 2015-08-21 LAB — THYROID PANEL WITH TSH
Free Thyroxine Index: 1.5 (ref 1.2–4.9)
T3 Uptake Ratio: 28 % (ref 24–39)
T4, Total: 5.4 ug/dL (ref 4.5–12.0)
TSH: 2.99 u[IU]/mL (ref 0.450–4.500)

## 2015-08-21 LAB — VITAMIN D 25 HYDROXY (VIT D DEFICIENCY, FRACTURES): Vit D, 25-Hydroxy: 15 ng/mL — ABNORMAL LOW (ref 30.0–100.0)

## 2015-08-27 ENCOUNTER — Other Ambulatory Visit: Payer: Self-pay | Admitting: Pharmacist

## 2015-08-27 MED ORDER — VITAMIN D (ERGOCALCIFEROL) 1.25 MG (50000 UNIT) PO CAPS: 50000.0000 [IU] | ORAL_CAPSULE | ORAL | Status: DC

## 2015-08-30 ENCOUNTER — Telehealth: Payer: Self-pay | Admitting: Pharmacist

## 2015-08-30 NOTE — Telephone Encounter (Signed)
Patient aware of lab result and Rx called in to vitamin D.

## 2015-09-21 ENCOUNTER — Ambulatory Visit (INDEPENDENT_AMBULATORY_CARE_PROVIDER_SITE_OTHER): Payer: BLUE CROSS/BLUE SHIELD | Admitting: Nurse Practitioner

## 2015-09-21 ENCOUNTER — Encounter: Payer: Self-pay | Admitting: Nurse Practitioner

## 2015-09-21 VITALS — BP 110/65 | HR 67 | Temp 96.9°F | Ht 65.0 in | Wt 225.0 lb

## 2015-09-21 DIAGNOSIS — R05 Cough: Secondary | ICD-10-CM

## 2015-09-21 DIAGNOSIS — K219 Gastro-esophageal reflux disease without esophagitis: Secondary | ICD-10-CM

## 2015-09-21 DIAGNOSIS — I10 Essential (primary) hypertension: Secondary | ICD-10-CM | POA: Diagnosis not present

## 2015-09-21 DIAGNOSIS — K581 Irritable bowel syndrome with constipation: Secondary | ICD-10-CM | POA: Diagnosis not present

## 2015-09-21 DIAGNOSIS — E669 Obesity, unspecified: Secondary | ICD-10-CM | POA: Diagnosis not present

## 2015-09-21 DIAGNOSIS — I4891 Unspecified atrial fibrillation: Secondary | ICD-10-CM | POA: Diagnosis not present

## 2015-09-21 DIAGNOSIS — F5102 Adjustment insomnia: Secondary | ICD-10-CM

## 2015-09-21 DIAGNOSIS — M5137 Other intervertebral disc degeneration, lumbosacral region: Secondary | ICD-10-CM

## 2015-09-21 DIAGNOSIS — F3170 Bipolar disorder, currently in remission, most recent episode unspecified: Secondary | ICD-10-CM

## 2015-09-21 DIAGNOSIS — R059 Cough, unspecified: Secondary | ICD-10-CM

## 2015-09-21 MED ORDER — LORAZEPAM 0.5 MG PO TABS: 0.5000 mg | ORAL_TABLET | Freq: Two times a day (BID) | ORAL | Status: DC | PRN

## 2015-09-21 MED ORDER — BENZONATATE 100 MG PO CAPS: 100.0000 mg | ORAL_CAPSULE | Freq: Three times a day (TID) | ORAL | Status: DC | PRN

## 2015-09-21 MED ORDER — METOPROLOL TARTRATE 50 MG PO TABS: ORAL_TABLET | ORAL | Status: DC

## 2015-09-21 MED ORDER — GABAPENTIN 100 MG PO CAPS: 100.0000 mg | ORAL_CAPSULE | Freq: Four times a day (QID) | ORAL | Status: DC

## 2015-09-21 MED ORDER — OMEPRAZOLE 40 MG PO CPDR: DELAYED_RELEASE_CAPSULE | ORAL | Status: DC

## 2015-09-21 MED ORDER — LAMOTRIGINE 200 MG PO TABS: 200.0000 mg | ORAL_TABLET | Freq: Two times a day (BID) | ORAL | Status: DC

## 2015-09-21 NOTE — Patient Instructions (Signed)

## 2015-09-21 NOTE — Progress Notes (Signed)
Subjective:    Patient ID: Doris Smith, female    DOB: 15-May-1951, 65 y.o.   MRN: [id number]   Patient here today for follow up of chronic medical problems.  Outpatient Encounter Prescriptions as of 09/21/2015  Medication Sig  . aspirin 325 MG tablet Take 325 mg by mouth every other day. Reported on 08/20/2015  . cyclobenzaprine (FLEXERIL) 10 MG tablet TAKE 1 TABLET (10 MG TOTAL) BY MOUTH 3 (THREE) TIMES DAILY AS NEEDED F OR MUSCLE SPASMS.  Marland Kitchen gabapentin (NEURONTIN) 100 MG capsule Take 100 mg by mouth 4 (four) times daily.   Marland Kitchen lamoTRIgine (LAMICTAL) 200 MG tablet Take 200 mg by mouth 2 (two) times daily.   Marland Kitchen LORazepam (ATIVAN) 0.5 MG tablet Take 0.5 mg by mouth 2 (two) times daily as needed.  . meloxicam (MOBIC) 15 MG tablet Take 1 tablet (15 mg total) by mouth daily.  . metoprolol (LOPRESSOR) 50 MG tablet TAKE 1 TABLET (50 MG TOTAL) BY MOUTH 2 (TWO) TIMES DAILY.  Marland Kitchen omeprazole (PRILOSEC) 40 MG capsule TAKE 1 CAPSULE (40 MG TOTAL)  BY MOUTH DAILY.  Marland Kitchen Vitamin D, Ergocalciferol, (DRISDOL) 50000 units CAPS capsule Take 1 capsule (50,000 Units total) by mouth every 7 (seven) days.   No facility-administered encounter medications on file as of 09/21/2015.   * patient went to urgent care on March 4th with cough and pain in chest r/t coughing so much. She was given a cough syrup that made her have nightmares and could not sleep good. She stopped taking the cough syrup after 4 days. Still has cough today. Took tessalon in the past and it worked good for her.    Hyperlipidemia This is a chronic problem. The problem is uncontrolled. Recent lipid tests were reviewed and are variable. Associated symptoms include myalgias. Current antihyperlipidemic treatment includes statins. The current treatment provides moderate improvement of lipids. Compliance problems include adherence to exercise.  Risk factors for coronary artery disease include dyslipidemia, hypertension, obesity and post-menopausal.   GERD Omeprazole 40 mg daily. When taken daily, sx are under control but patient does not remember to take all the time. IBS Sees gastroenterologist every 3-6 months to manage. Prescribed Amitza which has helped to control bloating and constipation. Bipolar Disorder Sees psychologist every 6 weeks for management of bipolar. Moods are still very variable according to patient. Patient states she is still depressed and tired. Does take Ativan nightly but has difficulty staying sleep. Hypertension/A. Fib Currently taking Metoprolol and Aspirin daily. Sees cardiologist every 3-6 months. Has had recent episodes of A. Fib but did not need conversion. Some blood pressures running low- Having U/S of heart next week Chronic back and neck pain On cymbalta which helps some with her pain tolerance.    Review of Systems  Constitutional: Positive for fatigue.  HENT: Positive for congestion, ear pain, postnasal drip and sore throat.   Eyes: Negative.   Respiratory: Positive for cough and chest tightness.   Cardiovascular: Negative.   Gastrointestinal: Negative.   Endocrine: Negative.   Genitourinary: Negative.   Musculoskeletal: Positive for myalgias.  Skin: Negative.   Allergic/Immunologic: Negative.   Neurological: Negative.   Psychiatric/Behavioral: Positive for sleep disturbance. The patient is nervous/anxious.   All other systems reviewed and are negative.      Objective:   Physical Exam  Constitutional: She is oriented to person, place, and time. She appears well-developed and well-nourished.  HENT:  Head: Normocephalic.  Right Ear: External ear normal.  Left Ear: External ear normal.  Mouth/Throat: Oropharynx is clear and moist.  Eyes: Pupils are equal, round, and reactive to light.  Neck: Normal range of motion. Neck supple. Decreased range of motion: with left rotation and extension.  Cardiovascular: Regular rhythm.   Pulmonary/Chest: Effort normal and breath sounds normal.   Abdominal: Soft. Normal appearance. She exhibits fluid wave and abdominal bruit. There is tenderness (Throughout all quadrants).  Musculoskeletal:  Left sided upper and lower ext weakness  Neurological: She is alert and oriented to person, place, and time. She has normal reflexes. No cranial nerve deficit.  Skin: Skin is warm and dry.  Psychiatric: She has a normal mood and affect. Her behavior is normal. Judgment and thought content normal.   BP 110/65 mmHg  Pulse 67  Temp(Src) 96.9 F (36.1 C) (Oral)  Ht '5\' 5"'  (1.651 m)  Wt 225 lb (102.059 kg)  BMI 37.44 kg/m2       Assessment & Plan:   1. Essential hypertension No salt in diet - metoprolol (LOPRESSOR) 50 MG tablet; TAKE 1 TABLET (50 MG TOTAL) BY MOUTH 2 (TWO) TIMES DAILY.  Dispense: 60 tablet; Refill: 5 - CMP14+EGFR - Lipid panel  2. Atrial fibrillation, unspecified type (HCC) Avoid  Caffeine    3. Irritable bowel syndrome with constipation Drink plenty of water   4. Gastroesophageal reflux disease without esophagitis Do not eat 2 hours before bedtime. Avoid spicy foods - gabapentin (NEURONTIN) 100 MG capsule; Take 1 capsule (100 mg total) by mouth 4 (four) times daily.  Dispense: 120 capsule; Refill: 5 - omeprazole (PRILOSEC) 40 MG capsule; TAKE 1 CAPSULE (40 MG TOTAL)  BY MOUTH DAILY.  Dispense: 30 capsule; Refill: 5  5. DEGENERATIVE DISC DISEASE, LUMBOSACRAL SPINE  6. Bipolar disorder in partial remission, most recent episode unspecified type ([place]) Stress management - LORazepam (ATIVAN) 0.5 MG tablet; Take 1 tablet (0.5 mg total) by mouth 2 (two) times daily as needed.  Dispense: 30 tablet; Refill: 2 - lamoTRIgine (LAMICTAL) 200 MG tablet; Take 1 tablet (200 mg total) by mouth 2 (two) times daily.  Dispense: 60 tablet; Refill: 5  7. Obesity (BMI 30-39.9) Drink plenty of water. Avoid high fat diet.   8. Insomnia due to stress Bedtime ritual  9. Cough Force fluids - benzonatate (TESSALON PERLES) 100 MG  capsule; Take 1 capsule (100 mg total) by mouth 3 (three) times daily as needed for cough.  Dispense: 20 capsule; Refill: 0    Labs pending Health maintenance reviewed Diet and exercise encouraged Continue all meds Follow up  In 6 months  Rosalio Loud FNP Student [name] Done, [place]

## 2015-09-22 LAB — CMP14+EGFR
ALT: 21 IU/L (ref 0–32)
AST: 17 IU/L (ref 0–40)
Albumin/Globulin Ratio: 1.5 (ref 1.2–2.2)
Albumin: 3.9 g/dL (ref 3.6–4.8)
Alkaline Phosphatase: 110 IU/L (ref 39–117)
BUN/Creatinine Ratio: 9 — ABNORMAL LOW (ref 11–26)
BUN: 7 mg/dL — ABNORMAL LOW (ref 8–27)
Bilirubin Total: 0.4 mg/dL (ref 0.0–1.2)
CO2: 21 mmol/L (ref 18–29)
Calcium: 8.7 mg/dL (ref 8.7–10.3)
Chloride: 103 mmol/L (ref 96–106)
Creatinine, Ser: 0.8 mg/dL (ref 0.57–1.00)
GFR calc Af Amer: 91 mL/min/{1.73_m2} (ref >59)
GFR calc non Af Amer: 79 mL/min/{1.73_m2} (ref >59)
Globulin, Total: 2.6 g/dL (ref 1.5–4.5)
Glucose: 92 mg/dL (ref 65–99)
Potassium: 4.5 mmol/L (ref 3.5–5.2)
Sodium: 143 mmol/L (ref 134–144)
Total Protein: 6.5 g/dL (ref 6.0–8.5)

## 2015-09-22 LAB — LIPID PANEL
Chol/HDL Ratio: 2.3 ratio units (ref 0.0–4.4)
Cholesterol, Total: 166 mg/dL (ref 100–199)
HDL: 71 mg/dL (ref >39)
LDL Calculated: 64 mg/dL (ref 0–99)
Triglycerides: 153 mg/dL — ABNORMAL HIGH (ref 0–149)
VLDL Cholesterol Cal: 31 mg/dL (ref 5–40)

## 2015-09-23 ENCOUNTER — Telehealth: Payer: Self-pay | Admitting: Nurse Practitioner

## 2015-09-23 ENCOUNTER — Encounter: Payer: Self-pay | Admitting: *Deleted

## 2015-09-23 NOTE — Telephone Encounter (Signed)
Patient aware that she will need to schedule a nurse visit to receive the vaccine.

## 2015-10-12 ENCOUNTER — Encounter: Payer: Self-pay | Admitting: *Deleted

## 2015-10-12 ENCOUNTER — Encounter (INDEPENDENT_AMBULATORY_CARE_PROVIDER_SITE_OTHER): Payer: Self-pay

## 2015-10-12 ENCOUNTER — Encounter: Payer: Self-pay | Admitting: Nurse Practitioner

## 2015-10-12 ENCOUNTER — Ambulatory Visit (INDEPENDENT_AMBULATORY_CARE_PROVIDER_SITE_OTHER): Payer: BLUE CROSS/BLUE SHIELD | Admitting: Nurse Practitioner

## 2015-10-12 VITALS — BP 96/67 | HR 54 | Temp 97.0°F | Ht 65.0 in | Wt 224.0 lb

## 2015-10-12 DIAGNOSIS — R109 Unspecified abdominal pain: Secondary | ICD-10-CM | POA: Diagnosis not present

## 2015-10-12 MED ORDER — CYCLOBENZAPRINE HCL 10 MG PO TABS: ORAL_TABLET | ORAL | Status: DC

## 2015-10-12 NOTE — Progress Notes (Signed)
   Subjective:    Patient ID: Doris Smith, female    DOB: Apr 16, 1951, 65 y.o.   MRN: [id number]  HPI  Patient in c/o of upper right flank pain- started about 3 weeks ago- describes pain as stabbing pain and is constant but intensity changes- currently rates pain 3/10 but can goes as high as 10/10- laying down increases pain- sitting up helps pain- pain radiates around towards breast- pain also increase with twisting- has taken  No meds.    Review of Systems  Constitutional: Negative.   Respiratory: Negative.   Cardiovascular: Negative.   Genitourinary: Negative.   Neurological: Negative.   Psychiatric/Behavioral: Negative.        Objective:   Physical Exam  Constitutional: She is oriented to person, place, and time. She appears well-developed and well-nourished. No distress.  Cardiovascular: Normal rate, regular rhythm and normal heart sounds.   Pulmonary/Chest: Effort normal and breath sounds normal.  Neurological: She is alert and oriented to person, place, and time.  Skin: Skin is warm.  Psychiatric: She has a normal mood and affect. Her behavior is normal. Judgment and thought content normal.   BP 96/67 mmHg  Pulse 54  Temp(Src) 97 F (36.1 C) (Oral)  Ht 5\' 5"  (1.651 m)  Wt 224 lb (101.606 kg)  BMI 37.28 kg/m2  SpO2 100%        Assessment & Plan:  1. Right flank pain Moist heat to area Stretches RTO prn Continue aleve OTC - cyclobenzaprine (FLEXERIL) 10 MG tablet; TAKE 1 TABLET (10 MG TOTAL) BY MOUTH 3 (THREE) TIMES DAILY AS NEEDED F OR MUSCLE SPASMS.  Dispense: 30 tablet; Refill: [place], FNP

## 2015-10-27 ENCOUNTER — Ambulatory Visit: Payer: Self-pay | Admitting: Pharmacist

## 2015-10-28 ENCOUNTER — Ambulatory Visit: Payer: Self-pay | Admitting: Pharmacist

## 2015-11-16 ENCOUNTER — Other Ambulatory Visit: Payer: Self-pay | Admitting: Nurse Practitioner

## 2015-11-24 ENCOUNTER — Ambulatory Visit: Payer: Self-pay | Admitting: Pharmacist

## 2015-12-10 ENCOUNTER — Ambulatory Visit (INDEPENDENT_AMBULATORY_CARE_PROVIDER_SITE_OTHER): Payer: BLUE CROSS/BLUE SHIELD | Admitting: Family Medicine

## 2015-12-10 ENCOUNTER — Ambulatory Visit (INDEPENDENT_AMBULATORY_CARE_PROVIDER_SITE_OTHER): Payer: BLUE CROSS/BLUE SHIELD

## 2015-12-10 ENCOUNTER — Encounter: Payer: Self-pay | Admitting: Family Medicine

## 2015-12-10 VITALS — BP 134/86 | HR 60 | Temp 97.3°F | Ht 65.0 in | Wt 230.0 lb

## 2015-12-10 DIAGNOSIS — M7542 Impingement syndrome of left shoulder: Secondary | ICD-10-CM

## 2015-12-10 DIAGNOSIS — M75102 Unspecified rotator cuff tear or rupture of left shoulder, not specified as traumatic: Secondary | ICD-10-CM

## 2015-12-10 DIAGNOSIS — I10 Essential (primary) hypertension: Secondary | ICD-10-CM

## 2015-12-10 DIAGNOSIS — F3175 Bipolar disorder, in partial remission, most recent episode depressed: Secondary | ICD-10-CM

## 2015-12-10 MED ORDER — TOPIRAMATE 25 MG PO TABS: 25.0000 mg | ORAL_TABLET | Freq: Every day | ORAL | Status: DC

## 2015-12-10 MED ORDER — BETAMETHASONE SOD PHOS & ACET 6 (3-3) MG/ML IJ SUSP
6.0000 mg | Freq: Once | INTRAMUSCULAR | Status: AC
  Administered 2015-12-10: 6 mg via INTRAMUSCULAR

## 2015-12-10 MED ORDER — PHENTERMINE HCL 37.5 MG PO CAPS: 37.5000 mg | ORAL_CAPSULE | ORAL | Status: DC

## 2015-12-10 MED ORDER — DICLOFENAC SODIUM 75 MG PO TBEC: 75.0000 mg | DELAYED_RELEASE_TABLET | Freq: Two times a day (BID) | ORAL | Status: DC

## 2015-12-10 NOTE — Progress Notes (Signed)
Subjective:  Patient ID: SHANTY SICA, female    DOB: 02/06/1951  Age: 65 y.o. MRN: [id number]  CC: Arm Pain; Depression; and GAD   HPI Charina Dickes [place] presents for left arm/shoulder pain and painful weak for 1 month pain is 7/10. It has been getting worse in intensity and frequency. Strength has been declining. It is worst when she tries to lift her arm over her head but hurts for all motion. The point of maximal pain is at the lateral aspect of the shoulder at the distal deltoid just above its insertion at the humerus.  Also depressed over body image due to weight gain. She lost a lot of weight in one time when she was very sick. She is seeing a psychiatrist for her depression but there is no treatment available for her weight. She says she is willing to do whatever it takes. She is aware that she overeats in all areas but particularly likes sweets. She does not eat a lot of meat. History Naiyana has a past medical history of PONV (postoperative nausea and vomiting); Asthma (04/07/2011); Sleep apnea (04/2011 ); GERD (gastroesophageal reflux disease); Headache(784.0); Anxiety; Mental disorder; Depression; Fracture, ankle (10/2008); Irritable bowel syndrome; Fatty liver; A-fib ([place]) (11/2012); and Fibromyalgia.   She has past surgical history that includes Tonsillectomy; Partial hysterectomy (1982); Dilation and curettage of uterus (1981); Knee arthroscopy (Left, 2007); Total knee arthroplasty (Left, 12/18/2005); and Lumbar laminectomy/decompression microdiscectomy (05/31/2011).   Her family history includes Anesthesia problems in her mother and sister; Colon polyps in her father; Heart disease in her father and mother; Heart failure in her mother; Hypertension in her sister.She reports that she has never smoked. She has never used smokeless tobacco. She reports that she does not drink alcohol or use illicit drugs.    ROS Review of Systems  Constitutional: Negative for fever, activity change and  appetite change.  HENT: Negative for congestion, rhinorrhea and sore throat.   Eyes: Negative for visual disturbance.  Respiratory: Negative for cough and shortness of breath.   Cardiovascular: Negative for chest pain and palpitations.  Gastrointestinal: Negative for nausea, abdominal pain and diarrhea.  Genitourinary: Negative for dysuria.  Musculoskeletal: Negative for myalgias and arthralgias.  Psychiatric/Behavioral: Positive for dysphoric mood, decreased concentration and agitation. Negative for suicidal ideas, confusion and self-injury. The patient is nervous/anxious.     Objective:  BP 134/86 mmHg  Pulse 60  Temp(Src) 97.3 F (36.3 C) (Oral)  Ht 5\' 5"  (1.651 m)  Wt 230 lb (104.327 kg)  BMI 38.27 kg/m2  SpO2 100%  BP Readings from Last 3 Encounters:  12/10/15 134/86  10/12/15 96/67  09/21/15 110/65    Wt Readings from Last 3 Encounters:  12/10/15 230 lb (104.327 kg)  10/12/15 224 lb (101.606 kg)  09/21/15 225 lb (102.059 kg)     Physical Exam  Constitutional: She is oriented to person, place, and time. She appears well-developed and well-nourished. She appears distressed (tearful).  HENT:  Head: Normocephalic and atraumatic.  Eyes: Conjunctivae are normal. Pupils are equal, round, and reactive to light.  Neck: Normal range of motion. Neck supple. No thyromegaly present.  Cardiovascular: Normal rate, regular rhythm and normal heart sounds.   No murmur heard. Pulmonary/Chest: Effort normal and breath sounds normal. No respiratory distress. She has no wheezes. She has no rales.  Abdominal: Soft. Bowel sounds are normal. She exhibits no distension. There is no tenderness.  Musculoskeletal: She exhibits tenderness ( At the ball of the left shoulder laterally). She  exhibits no edema.  The left shoulder is tender and weak for resisted external rotation and 4 abduction. It is tender to palpation at the supra-spinatus insertion and at the deltoid insertion as well as the  pectoralis major insertion.   Lymphadenopathy:    She has no cervical adenopathy.  Neurological: She is alert and oriented to person, place, and time.  Skin: Skin is warm and dry.  Psychiatric: She has a normal mood and affect. Her behavior is normal. Judgment and thought content normal.     Lab Results  Component Value Date   WBC 7.1 02/01/2015   HGB 12.3 02/01/2015   HCT 38.5 02/01/2015   PLT 242 05/25/2011   GLUCOSE 92 09/21/2015   CHOL 166 09/21/2015   TRIG 153* 09/21/2015   HDL 71 09/21/2015   LDLCALC 64 09/21/2015   ALT 21 09/21/2015   AST 17 09/21/2015   NA 143 09/21/2015   K 4.5 09/21/2015   CL 103 09/21/2015   CREATININE 0.80 09/21/2015   BUN 7* 09/21/2015   CO2 21 09/21/2015   TSH 2.990 08/20/2015   INR 1.05 05/25/2011    Mr Brain Wo Contrast  04/20/2014  [facility] NEUROIMAGING REPORT STUDY DATE: 04/17/14 PATIENT NAME: RAI REPASKY DOB: 06-Jan-1951 MRN: [id number] ORDERING CLINICIAN: Star Age, MD PhD CLINICAL HISTORY: 65 year old female with gait difficulty. EXAM: MRI brain (without) TECHNIQUE: MRI of the brain without contrast was obtained utilizing 5 mm axial slices with T1, T2, T2 flair, SWI and diffusion weighted views.  T1 sagittal and T2 coronal views were obtained. CONTRAST: no IMAGING SITE: [facility] [address] (1.5 Tesla MRI)  FINDINGS: No abnormal lesions are seen on diffusion-weighted views to suggest acute ischemia. The cortical sulci, fissures and cisterns are normal in size and appearance. Lateral, third and fourth ventricle are normal in size and appearance. No extra-axial fluid collections are seen. No evidence of mass effect or midline shift. Minimal periventricular and subcortical foci of non-specific gliosis, likely within normal limits. On sagittal views the posterior fossa, pituitary gland and corpus callosum are unremarkable. No evidence of intracranial hemorrhage on SWI views. The orbits and their contents,  paranasal sinuses and calvarium are unremarkable.  Intracranial flow voids are present.   04/20/2014  Normal MRI brain (without). INTERPRETING PHYSICIAN: Penni Bombard, MD Certified in Neurology, Neurophysiology and Neuroimaging [facility] [address], [place], [place] [address] [phone]    Assessment & Plan:   Milta was seen today for arm pain, depression and gad.  Diagnoses and all orders for this visit:  Bipolar disorder, in partial remission, most recent episode depressed ([place])  Essential hypertension  Rotator cuff impingement syndrome, left -     Ambulatory referral to Physical Therapy -     DG Shoulder Left; Future -     betamethasone acetate-betamethasone sodium phosphate (CELESTONE) injection 6 mg; Inject 1 mL (6 mg total) into the muscle once.  Other orders -     phentermine 37.5 MG capsule; Take 1 capsule (37.5 mg total) by mouth every morning. -     topiramate (TOPAMAX) 25 MG tablet; Take 1 tablet (25 mg total) by mouth at bedtime. For weight loss support -     diclofenac (VOLTAREN) 75 MG EC tablet; Take 1 tablet (75 mg total) by mouth 2 (two) times daily. For muscle and  Joint pain    X-ray reveals no acute bony abnormality. There is acromioclavicular arthritic change  I have  discontinued Ms. Pilson's Vitamin D (Ergocalciferol) and benzonatate. I am also having her start on phentermine, topiramate, and diclofenac. Additionally, I am having her maintain her aspirin, meloxicam, LORazepam, gabapentin, lamoTRIgine, metoprolol, cyclobenzaprine, and omeprazole. We administered betamethasone acetate-betamethasone sodium phosphate.  Meds ordered this encounter  Medications  . phentermine 37.5 MG capsule    Sig: Take 1 capsule (37.5 mg total) by mouth every morning.    Dispense:  30 capsule    Refill:  2  . topiramate (TOPAMAX) 25 MG tablet    Sig: Take 1 tablet (25 mg total) by mouth at bedtime. For weight loss support    Dispense:  30  tablet    Refill:  2  . diclofenac (VOLTAREN) 75 MG EC tablet    Sig: Take 1 tablet (75 mg total) by mouth 2 (two) times daily. For muscle and  Joint pain    Dispense:  60 tablet    Refill:  2  . betamethasone acetate-betamethasone sodium phosphate (CELESTONE) injection 6 mg    Sig:    We discussed a low-carb approach to dieting in detail. She understands that the weight loss medications are only a supplement to her own efforts to help her start down a well-rounded program including resistance and cardio exercise.  Follow-up: No Follow-up on file.  Claretta Fraise, M.D.

## 2015-12-14 ENCOUNTER — Telehealth: Payer: Self-pay | Admitting: Nurse Practitioner

## 2016-01-03 ENCOUNTER — Encounter: Payer: BLUE CROSS/BLUE SHIELD | Admitting: *Deleted

## 2016-01-10 ENCOUNTER — Ambulatory Visit: Payer: BLUE CROSS/BLUE SHIELD | Admitting: Family Medicine

## 2016-01-18 ENCOUNTER — Ambulatory Visit: Payer: BLUE CROSS/BLUE SHIELD

## 2016-03-30 DIAGNOSIS — F4310 Post-traumatic stress disorder, unspecified: Secondary | ICD-10-CM | POA: Diagnosis not present

## 2016-03-30 DIAGNOSIS — F312 Bipolar disorder, current episode manic severe with psychotic features: Secondary | ICD-10-CM | POA: Diagnosis not present

## 2016-03-30 DIAGNOSIS — F410 Panic disorder [episodic paroxysmal anxiety] without agoraphobia: Secondary | ICD-10-CM | POA: Diagnosis not present

## 2016-05-02 ENCOUNTER — Encounter: Payer: BLUE CROSS/BLUE SHIELD | Admitting: *Deleted

## 2016-05-10 DIAGNOSIS — F410 Panic disorder [episodic paroxysmal anxiety] without agoraphobia: Secondary | ICD-10-CM | POA: Diagnosis not present

## 2016-05-10 DIAGNOSIS — F4310 Post-traumatic stress disorder, unspecified: Secondary | ICD-10-CM | POA: Diagnosis not present

## 2016-05-10 DIAGNOSIS — F312 Bipolar disorder, current episode manic severe with psychotic features: Secondary | ICD-10-CM | POA: Diagnosis not present

## 2016-05-15 DIAGNOSIS — G629 Polyneuropathy, unspecified: Secondary | ICD-10-CM | POA: Diagnosis not present

## 2016-05-15 DIAGNOSIS — S3993XA Unspecified injury of pelvis, initial encounter: Secondary | ICD-10-CM | POA: Diagnosis not present

## 2016-05-15 DIAGNOSIS — R296 Repeated falls: Secondary | ICD-10-CM | POA: Diagnosis not present

## 2016-05-15 DIAGNOSIS — R42 Dizziness and giddiness: Secondary | ICD-10-CM | POA: Diagnosis not present

## 2016-05-15 DIAGNOSIS — R55 Syncope and collapse: Secondary | ICD-10-CM | POA: Diagnosis not present

## 2016-05-15 DIAGNOSIS — S0990XA Unspecified injury of head, initial encounter: Secondary | ICD-10-CM | POA: Diagnosis not present

## 2016-05-15 DIAGNOSIS — R531 Weakness: Secondary | ICD-10-CM | POA: Diagnosis not present

## 2016-05-15 DIAGNOSIS — M545 Low back pain: Secondary | ICD-10-CM | POA: Diagnosis not present

## 2016-05-15 DIAGNOSIS — R001 Bradycardia, unspecified: Secondary | ICD-10-CM | POA: Diagnosis not present

## 2016-05-15 DIAGNOSIS — S199XXA Unspecified injury of neck, initial encounter: Secondary | ICD-10-CM | POA: Diagnosis not present

## 2016-05-15 DIAGNOSIS — R404 Transient alteration of awareness: Secondary | ICD-10-CM | POA: Diagnosis not present

## 2016-05-15 DIAGNOSIS — S299XXA Unspecified injury of thorax, initial encounter: Secondary | ICD-10-CM | POA: Diagnosis not present

## 2016-05-16 DIAGNOSIS — G463 Brain stem stroke syndrome: Secondary | ICD-10-CM | POA: Diagnosis not present

## 2016-05-16 DIAGNOSIS — E538 Deficiency of other specified B group vitamins: Secondary | ICD-10-CM | POA: Diagnosis not present

## 2016-05-16 DIAGNOSIS — R42 Dizziness and giddiness: Secondary | ICD-10-CM | POA: Diagnosis not present

## 2016-05-16 DIAGNOSIS — Z6837 Body mass index (BMI) 37.0-37.9, adult: Secondary | ICD-10-CM | POA: Diagnosis not present

## 2016-05-16 DIAGNOSIS — R55 Syncope and collapse: Secondary | ICD-10-CM | POA: Diagnosis not present

## 2016-05-16 DIAGNOSIS — R001 Bradycardia, unspecified: Secondary | ICD-10-CM | POA: Diagnosis not present

## 2016-05-17 DIAGNOSIS — F068 Other specified mental disorders due to known physiological condition: Secondary | ICD-10-CM | POA: Diagnosis not present

## 2016-05-17 DIAGNOSIS — R42 Dizziness and giddiness: Secondary | ICD-10-CM | POA: Diagnosis not present

## 2016-05-17 DIAGNOSIS — E538 Deficiency of other specified B group vitamins: Secondary | ICD-10-CM | POA: Diagnosis not present

## 2016-05-17 DIAGNOSIS — G463 Brain stem stroke syndrome: Secondary | ICD-10-CM | POA: Diagnosis not present

## 2016-05-17 DIAGNOSIS — Z6837 Body mass index (BMI) 37.0-37.9, adult: Secondary | ICD-10-CM | POA: Diagnosis not present

## 2016-05-17 DIAGNOSIS — R55 Syncope and collapse: Secondary | ICD-10-CM | POA: Diagnosis not present

## 2016-05-17 DIAGNOSIS — G629 Polyneuropathy, unspecified: Secondary | ICD-10-CM | POA: Diagnosis not present

## 2016-05-18 DIAGNOSIS — G43909 Migraine, unspecified, not intractable, without status migrainosus: Secondary | ICD-10-CM | POA: Diagnosis not present

## 2016-05-18 DIAGNOSIS — G8929 Other chronic pain: Secondary | ICD-10-CM | POA: Diagnosis not present

## 2016-05-18 DIAGNOSIS — M549 Dorsalgia, unspecified: Secondary | ICD-10-CM | POA: Diagnosis not present

## 2016-05-18 DIAGNOSIS — Z780 Asymptomatic menopausal state: Secondary | ICD-10-CM | POA: Diagnosis not present

## 2016-05-18 DIAGNOSIS — K449 Diaphragmatic hernia without obstruction or gangrene: Secondary | ICD-10-CM | POA: Diagnosis not present

## 2016-05-18 DIAGNOSIS — F319 Bipolar disorder, unspecified: Secondary | ICD-10-CM | POA: Diagnosis not present

## 2016-05-18 DIAGNOSIS — E876 Hypokalemia: Secondary | ICD-10-CM | POA: Diagnosis not present

## 2016-05-18 DIAGNOSIS — M159 Polyosteoarthritis, unspecified: Secondary | ICD-10-CM | POA: Diagnosis not present

## 2016-05-18 DIAGNOSIS — R55 Syncope and collapse: Secondary | ICD-10-CM | POA: Diagnosis not present

## 2016-05-18 DIAGNOSIS — G629 Polyneuropathy, unspecified: Secondary | ICD-10-CM | POA: Diagnosis not present

## 2016-05-18 DIAGNOSIS — R296 Repeated falls: Secondary | ICD-10-CM | POA: Diagnosis not present

## 2016-05-18 DIAGNOSIS — I480 Paroxysmal atrial fibrillation: Secondary | ICD-10-CM | POA: Diagnosis not present

## 2016-05-18 DIAGNOSIS — E538 Deficiency of other specified B group vitamins: Secondary | ICD-10-CM | POA: Diagnosis not present

## 2016-05-18 DIAGNOSIS — R42 Dizziness and giddiness: Secondary | ICD-10-CM | POA: Diagnosis not present

## 2016-05-18 DIAGNOSIS — Z7982 Long term (current) use of aspirin: Secondary | ICD-10-CM | POA: Diagnosis not present

## 2016-05-18 DIAGNOSIS — K589 Irritable bowel syndrome without diarrhea: Secondary | ICD-10-CM | POA: Diagnosis not present

## 2016-05-18 DIAGNOSIS — I700 Atherosclerosis of aorta: Secondary | ICD-10-CM | POA: Diagnosis not present

## 2016-05-18 DIAGNOSIS — J4520 Mild intermittent asthma, uncomplicated: Secondary | ICD-10-CM | POA: Diagnosis not present

## 2016-05-25 ENCOUNTER — Ambulatory Visit (INDEPENDENT_AMBULATORY_CARE_PROVIDER_SITE_OTHER): Payer: BLUE CROSS/BLUE SHIELD | Admitting: Nurse Practitioner

## 2016-05-25 ENCOUNTER — Encounter: Payer: Self-pay | Admitting: Nurse Practitioner

## 2016-05-25 VITALS — BP 107/60 | HR 47 | Temp 97.4°F | Ht 65.0 in | Wt 229.0 lb

## 2016-05-25 DIAGNOSIS — Z09 Encounter for follow-up examination after completed treatment for conditions other than malignant neoplasm: Secondary | ICD-10-CM

## 2016-05-25 DIAGNOSIS — R531 Weakness: Secondary | ICD-10-CM

## 2016-05-25 DIAGNOSIS — R5383 Other fatigue: Secondary | ICD-10-CM | POA: Diagnosis not present

## 2016-05-25 DIAGNOSIS — R001 Bradycardia, unspecified: Secondary | ICD-10-CM

## 2016-05-25 NOTE — Progress Notes (Signed)
   Subjective:    Patient ID: Doris Smith, female    DOB: [birth year], 65 y.o.   MRN: [id number]  HPI Patient is here today for hospital follow up. She was admitted on 05/15/16 for  Syncope and collapse. They thought sh ewas having a light stroke- they ran all kinds of test and nothing showed up positive for stroke. She ssays that they did  Not tell her anything and they did not know what happened to her. She says she is still having problems with her balance and dizziness- SHe starts physical therapy tomorrow. They told her to walk with walker to prevent fall, but  She is here with a cane today. Saw neurologist in hospital and they did not tell her to follow up with them. According to records- in hospital she had sinus bradycardia and blood pressure was low. She says she still feels weak and tired and she has fallen 2 x since she has been discharged.  * Has appointmnet with Dr. Warren Lacy- cardiologist in 1 week to check out heart  Review of Systems  Constitutional: Positive for fatigue. Negative for appetite change, fever and unexpected weight change.  Eyes: Negative.   Respiratory: Negative.   Cardiovascular: Negative.   Genitourinary: Negative.   Neurological: Positive for dizziness and weakness.  Psychiatric/Behavioral: Negative.   All other systems reviewed and are negative.      Objective:   Physical Exam  Constitutional: She is oriented to person, place, and time. She appears well-developed and well-nourished.  Cardiovascular: Regular rhythm.   Sinus bradycardia  Pulmonary/Chest: Effort normal and breath sounds normal.  Musculoskeletal:  Walking with cane- gait slow and  Motor strength and sensation distally itact bil upper and lower ext.  Neurological: She is alert and oriented to person, place, and time. She has normal reflexes. No cranial nerve deficit.  Skin: Skin is warm.  Psychiatric: She has a normal mood and affect. Her behavior is normal. Judgment and thought content  normal.   BP 107/60   Pulse (!) 47   Temp 97.4 F (36.3 C) (Oral)   Ht 5\' 5"  (1.651 m)   Wt 229 lb (103.9 kg)   BMI 38.11 kg/m          Assessment & Plan:   1. Hospital discharge follow-up   2. Sinus bradycardia   3. Weakness   4. Fatigue, unspecified type    Reviewed hospital records Hold metoprolol until see Dr. Warren Lacy Fall precautions Start physical therapy as ordered  [name] Done, FNP

## 2016-05-25 NOTE — Patient Instructions (Signed)
Fall Prevention in the Home Introduction Falls can cause injuries. They can happen to people of all ages. There are many things you can do to make your home safe and to help prevent falls. What can I do on the outside of my home?  Regularly fix the edges of walkways and driveways and fix any cracks.  Remove anything that might make you trip as you walk through a door, such as a raised step or threshold.  Trim any bushes or trees on the path to your home.  Use bright outdoor lighting.  Clear any walking paths of anything that might make someone trip, such as rocks or tools.  Regularly check to see if handrails are loose or broken. Make sure that both sides of any steps have handrails.  Any raised decks and porches should have guardrails on the edges.  Have any leaves, snow, or ice cleared regularly.  Use sand or salt on walking paths during winter.  Clean up any spills in your garage right away. This includes oil or grease spills. What can I do in the bathroom?  Use night lights.  Install grab bars by the toilet and in the tub and shower. Do not use towel bars as grab bars.  Use non-skid mats or decals in the tub or shower.  If you need to sit down in the shower, use a plastic, non-slip stool.  Keep the floor dry. Clean up any water that spills on the floor as soon as it happens.  Remove soap buildup in the tub or shower regularly.  Attach bath mats securely with double-sided non-slip rug tape.  Do not have throw rugs and other things on the floor that can make you trip. What can I do in the bedroom?  Use night lights.  Make sure that you have a light by your bed that is easy to reach.  Do not use any sheets or blankets that are too big for your bed. They should not hang down onto the floor.  Have a firm chair that has side arms. You can use this for support while you get dressed.  Do not have throw rugs and other things on the floor that can make you trip. What can  I do in the kitchen?  Clean up any spills right away.  Avoid walking on wet floors.  Keep items that you use a lot in easy-to-reach places.  If you need to reach something above you, use a strong step stool that has a grab bar.  Keep electrical cords out of the way.  Do not use floor polish or wax that makes floors slippery. If you must use wax, use non-skid floor wax.  Do not have throw rugs and other things on the floor that can make you trip. What can I do with my stairs?  Do not leave any items on the stairs.  Make sure that there are handrails on both sides of the stairs and use them. Fix handrails that are broken or loose. Make sure that handrails are as long as the stairways.  Check any carpeting to make sure that it is firmly attached to the stairs. Fix any carpet that is loose or worn.  Avoid having throw rugs at the top or bottom of the stairs. If you do have throw rugs, attach them to the floor with carpet tape.  Make sure that you have a light switch at the top of the stairs and the bottom of the stairs. If you   do not have them, ask someone to add them for you. What else can I do to help prevent falls?  Wear shoes that:  Do not have high heels.  Have rubber bottoms.  Are comfortable and fit you well.  Are closed at the toe. Do not wear sandals.  If you use a stepladder:  Make sure that it is fully opened. Do not climb a closed stepladder.  Make sure that both sides of the stepladder are locked into place.  Ask someone to hold it for you, if possible.  Clearly mark and make sure that you can see:  Any grab bars or handrails.  First and last steps.  Where the edge of each step is.  Use tools that help you move around (mobility aids) if they are needed. These include:  Canes.  Walkers.  Scooters.  Crutches.  Turn on the lights when you go into a dark area. Replace any light bulbs as soon as they burn out.  Set up your furniture so you have a  clear path. Avoid moving your furniture around.  If any of your floors are uneven, fix them.  If there are any pets around you, be aware of where they are.  Review your medicines with your doctor. Some medicines can make you feel dizzy. This can increase your chance of falling. Ask your doctor what other things that you can do to help prevent falls. This information is not intended to replace advice given to you by your health care provider. Make sure you discuss any questions you have with your health care provider. Document Released: 04/22/2009 Document Revised: 12/02/2015 Document Reviewed: 07/31/2014  2017 Elsevier  

## 2016-06-16 ENCOUNTER — Ambulatory Visit: Payer: BLUE CROSS/BLUE SHIELD | Admitting: Cardiology

## 2016-07-03 NOTE — Progress Notes (Addendum)
Cardiology Office Note   Date:  07/04/2016   ID:  Mersaydes, Obst 10/25/1950, MRN [id number]  PCP:  Chevis Pretty, FNP  Cardiologist:   Minus Breeding, MD   Chief Complaint  Patient presents with  . Fall     History of Present Illness: Doris Smith is a 65 y.o. female who presents for evaluation of palpitations. She apparently had a history of atrial fibrillation at [facility] in 2014 although I don't have these records and was never able to get them.   At a previous visit she did wear an event monitor which demonstrated no fibrillation although she did have premature atrial and ventricular contractions. Holter monitor was unremarkable. Since I last saw her she has has had increasing problems with falls. This has been progressive over the past year. She's not having any syncope. She says her legs about. She doesn't have vision problems. She doesn't see the room spinning. She might lose her balance but it's mostly leg weakness. She is walking with a cane for some time because of joint problems and back problems. She did have her beta blocker dose stopped recently as her heart rate was slightly lower blood pressure low. She does have some mild orthostatic symptoms.  Past Medical History:  Diagnosis Date  . A-fib ([place]) 11/2012  . Anxiety    takes Ativan  . Asthma 04/07/2011   dx  . Depression   . Fatty liver   . Fibromyalgia   . Fracture, ankle 10/2008   right and left  . GERD (gastroesophageal reflux disease)   . Headache(784.0)    migraines  . Irritable bowel syndrome   . Mental disorder    dx bipolar  . PONV (postoperative nausea and vomiting)   . Sleep apnea 04/2011    No CPAP    Past Surgical History:  Procedure Laterality Date  . DILATION AND CURETTAGE OF UTERUS  1981   abnormal pap  . KNEE ARTHROSCOPY Left 2007   x 2  . LUMBAR LAMINECTOMY/DECOMPRESSION MICRODISCECTOMY  05/31/2011   Procedure: LUMBAR LAMINECTOMY/DECOMPRESSION MICRODISCECTOMY;   Surgeon: Johnn Hai;  Location: [facility];  Service: Orthopedics;  Laterality: Left;  Decompression Lumbar four to five and  Lumbar five to Sacral one on Left  (X-Ray)  . PARTIAL HYSTERECTOMY  1982  . TONSILLECTOMY     as child  . TOTAL KNEE ARTHROPLASTY Left 12/18/2005     Current Outpatient Prescriptions  Medication Sig Dispense Refill  . aspirin 325 MG tablet Take 325 mg by mouth every other day. Reported on 08/20/2015    . DULoxetine (CYMBALTA) 60 MG capsule Take 60 mg by mouth 2 (two) times daily.     Marland Kitchen gabapentin (NEURONTIN) 100 MG capsule Take 1 capsule (100 mg total) by mouth 4 (four) times daily. 120 capsule 5  . lamoTRIgine (LAMICTAL) 200 MG tablet Take 1 tablet (200 mg total) by mouth 2 (two) times daily. 60 tablet 5  . LORazepam (ATIVAN) 0.5 MG tablet Take 1 tablet (0.5 mg total) by mouth 2 (two) times daily as needed. 30 tablet 2  . metoprolol (LOPRESSOR) 50 MG tablet TAKE 1 TABLET (50 MG TOTAL) BY MOUTH 2 (TWO) TIMES DAILY. 60 tablet 5  . omeprazole (PRILOSEC) 40 MG capsule TAKE 1 CAPSULE BY MOUTH EACH DAY 30 capsule 3  . topiramate (TOPAMAX) 25 MG tablet Take 1 tablet (25 mg total) by mouth at bedtime. For weight loss support 30 tablet 2   No current  facility-administered medications for this visit.     Allergies:   Iohexol; Codeine; Sulfonamide derivatives; Celecoxib; and Vitamin b12    ROS:  Please see the history of present illness.   Otherwise, review of systems are positive for decreased balance, depression.   All other systems are reviewed and negative.    PHYSICAL EXAM: VS:  BP 102/70 (BP Location: Right Arm, Patient Position: Sitting, Cuff Size: Normal)   Pulse 75   Wt 220 lb (99.8 kg)   SpO2 96%   BMI 36.61 kg/m  , BMI Body mass index is 36.61 kg/m. GENERAL:  Well appearing HEENT:  Pupils equal round and reactive, fundi not visualized, oral mucosa unremarkable NECK:  No jugular venous distention, waveform within normal limits, carotid upstroke brisk and  symmetric, no bruits, no thyromegaly LUNGS:  Clear to auscultation bilaterally BACK:  No CVA tenderness CHEST:  Unremarkable HEART:  PMI not displaced or sustained,S1 and S2 within normal limits, no S3, no S4, no clicks, no rubs, no murmurs ABD:  Flat, positive bowel sounds normal in frequency in pitch, no bruits, no rebound, no guarding, no midline pulsatile mass, no hepatomegaly, no splenomegaly EXT:  2 plus pulses throughout, no edema, no cyanosis no clubbing   EKG:  EKG is  ordered today. Sinus rhythm, rate 75, axis within normal limits, intervals within normal limits, no acute ST-T wave changes. PACs.     Recent Labs: 08/20/2015: TSH 2.990 09/21/2015: ALT 21; BUN 7; Creatinine, Ser 0.80; Potassium 4.5; Sodium 143    Lipid Panel    Component Value Date/Time   CHOL 166 09/21/2015 1035   TRIG 153 (H) 09/21/2015 1035   TRIG 114 11/03/2014 1119   HDL 71 09/21/2015 1035   HDL 82 11/03/2014 1119   CHOLHDL 2.3 09/21/2015 1035   LDLCALC 64 09/21/2015 1035   LDLCALC 94 10/01/2013 0926      Wt Readings from Last 3 Encounters:  07/04/16 220 lb (99.8 kg)  05/25/16 229 lb (103.9 kg)  12/10/15 230 lb (104.3 kg)    Lab Results  Component Value Date   TSH 2.990 08/20/2015     Other studies Reviewed: Additional studies/ records that were reviewed today include: none   ASSESSMENT AND PLAN:  PALPITATIONS:    She is doing okay off the beta blocker. No change in therapy is indicated. No further cardiac evaluation is indicated.  FALLS:    I don't think this is related to anything cardiac. I think is probably neurologic. In the office today her orthostatic blood pressures were not significant.   I am going to refer her for neurology evaluation.     Current medicines are reviewed at length with the patient today.  The patient does not have concerns regarding medicines.  The following changes have been made:  no change  Labs/ tests ordered today include:   Orders Placed This  Encounter  Procedures  . Ambulatory referral to Neurology  . EKG 12-Lead     Disposition:   FU with me in 12 months.     Signed, Minus Breeding, MD  07/04/2016 11:22 AM    [facility]

## 2016-07-04 ENCOUNTER — Encounter: Payer: Self-pay | Admitting: Cardiology

## 2016-07-04 ENCOUNTER — Ambulatory Visit (INDEPENDENT_AMBULATORY_CARE_PROVIDER_SITE_OTHER): Payer: BLUE CROSS/BLUE SHIELD | Admitting: Cardiology

## 2016-07-04 VITALS — BP 102/70 | HR 75 | Wt 220.0 lb

## 2016-07-04 DIAGNOSIS — R002 Palpitations: Secondary | ICD-10-CM

## 2016-07-04 DIAGNOSIS — R296 Repeated falls: Secondary | ICD-10-CM | POA: Diagnosis not present

## 2016-07-04 NOTE — Patient Instructions (Addendum)
Medication Instructions:  Continue current medications  Labwork: None Ordered  Testing/Procedures: None Ordered  Follow-Up: You have been referred to Neurologist  Your physician wants you to follow-up in: 1 Year. You will receive a reminder letter in the mail two months in advance. If you don't receive a letter, please call our office to schedule the follow-up appointment.   Any Other Special Instructions Will Be Listed Below (If Applicable).      [place]   If you need a refill on your cardiac medications before your next appointment, please call your pharmacy.

## 2016-07-05 ENCOUNTER — Ambulatory Visit: Payer: BLUE CROSS/BLUE SHIELD | Admitting: Neurology

## 2016-07-23 DIAGNOSIS — J019 Acute sinusitis, unspecified: Secondary | ICD-10-CM | POA: Diagnosis not present

## 2016-07-25 ENCOUNTER — Other Ambulatory Visit: Payer: Self-pay | Admitting: Family Medicine

## 2016-07-31 NOTE — Telephone Encounter (Signed)
Please call in phentrmine with 0 refills Will need to be seen for future refills

## 2016-08-17 DIAGNOSIS — F410 Panic disorder [episodic paroxysmal anxiety] without agoraphobia: Secondary | ICD-10-CM | POA: Diagnosis not present

## 2016-08-17 DIAGNOSIS — F4310 Post-traumatic stress disorder, unspecified: Secondary | ICD-10-CM | POA: Diagnosis not present

## 2016-08-17 DIAGNOSIS — F312 Bipolar disorder, current episode manic severe with psychotic features: Secondary | ICD-10-CM | POA: Diagnosis not present

## 2016-09-13 DIAGNOSIS — F4310 Post-traumatic stress disorder, unspecified: Secondary | ICD-10-CM | POA: Diagnosis not present

## 2016-09-13 DIAGNOSIS — F312 Bipolar disorder, current episode manic severe with psychotic features: Secondary | ICD-10-CM | POA: Diagnosis not present

## 2016-09-13 DIAGNOSIS — F410 Panic disorder [episodic paroxysmal anxiety] without agoraphobia: Secondary | ICD-10-CM | POA: Diagnosis not present

## 2016-10-03 ENCOUNTER — Other Ambulatory Visit: Payer: Self-pay | Admitting: Nurse Practitioner

## 2016-10-03 DIAGNOSIS — K219 Gastro-esophageal reflux disease without esophagitis: Secondary | ICD-10-CM

## 2016-10-13 ENCOUNTER — Encounter: Payer: Self-pay | Admitting: Nurse Practitioner

## 2016-10-13 ENCOUNTER — Ambulatory Visit (INDEPENDENT_AMBULATORY_CARE_PROVIDER_SITE_OTHER): Payer: BLUE CROSS/BLUE SHIELD | Admitting: Nurse Practitioner

## 2016-10-13 ENCOUNTER — Ambulatory Visit (INDEPENDENT_AMBULATORY_CARE_PROVIDER_SITE_OTHER): Payer: BLUE CROSS/BLUE SHIELD

## 2016-10-13 VITALS — BP 107/59 | HR 65 | Temp 97.3°F | Ht 64.0 in | Wt 202.0 lb

## 2016-10-13 DIAGNOSIS — Z0000 Encounter for general adult medical examination without abnormal findings: Secondary | ICD-10-CM

## 2016-10-13 DIAGNOSIS — I4891 Unspecified atrial fibrillation: Secondary | ICD-10-CM

## 2016-10-13 DIAGNOSIS — E669 Obesity, unspecified: Secondary | ICD-10-CM

## 2016-10-13 DIAGNOSIS — I10 Essential (primary) hypertension: Secondary | ICD-10-CM | POA: Diagnosis not present

## 2016-10-13 DIAGNOSIS — M5137 Other intervertebral disc degeneration, lumbosacral region: Secondary | ICD-10-CM | POA: Diagnosis not present

## 2016-10-13 DIAGNOSIS — Z9289 Personal history of other medical treatment: Secondary | ICD-10-CM

## 2016-10-13 DIAGNOSIS — Z1159 Encounter for screening for other viral diseases: Secondary | ICD-10-CM | POA: Diagnosis not present

## 2016-10-13 DIAGNOSIS — Z01419 Encounter for gynecological examination (general) (routine) without abnormal findings: Secondary | ICD-10-CM

## 2016-10-13 DIAGNOSIS — F3175 Bipolar disorder, in partial remission, most recent episode depressed: Secondary | ICD-10-CM

## 2016-10-13 DIAGNOSIS — K219 Gastro-esophageal reflux disease without esophagitis: Secondary | ICD-10-CM

## 2016-10-13 DIAGNOSIS — K581 Irritable bowel syndrome with constipation: Secondary | ICD-10-CM

## 2016-10-13 DIAGNOSIS — F5102 Adjustment insomnia: Secondary | ICD-10-CM

## 2016-10-13 LAB — URINALYSIS, COMPLETE
Bilirubin, UA: NEGATIVE
Glucose, UA: NEGATIVE
Ketones, UA: NEGATIVE
Leukocytes, UA: NEGATIVE
Nitrite, UA: NEGATIVE
Protein, UA: NEGATIVE
RBC, UA: NEGATIVE
Specific Gravity, UA: 1.02 (ref 1.005–1.030)
Urobilinogen, Ur: 0.2 mg/dL (ref 0.2–1.0)
pH, UA: 8 — ABNORMAL HIGH (ref 5.0–7.5)

## 2016-10-13 LAB — MICROSCOPIC EXAMINATION
Epithelial Cells (non renal): >10 /hpf — AB (ref 0–10)
Renal Epithel, UA: NONE SEEN /hpf

## 2016-10-13 MED ORDER — OMEPRAZOLE 40 MG PO CPDR: 40.0000 mg | DELAYED_RELEASE_CAPSULE | Freq: Every day | ORAL | 5 refills | Status: DC

## 2016-10-13 MED ORDER — DULOXETINE HCL 60 MG PO CPEP: 60.0000 mg | ORAL_CAPSULE | Freq: Two times a day (BID) | ORAL | 5 refills | Status: DC

## 2016-10-13 MED ORDER — GABAPENTIN 300 MG PO CAPS: 300.0000 mg | ORAL_CAPSULE | Freq: Three times a day (TID) | ORAL | 5 refills | Status: DC

## 2016-10-13 NOTE — Progress Notes (Signed)
Subjective:    Patient ID: AIKA BRZOSKA, female    DOB: 11/30/1950, 66 y.o.   MRN: [id number]  HPI  ALEXX GIAMBRA is here today for follow up of chronic medical problem.  Outpatient Encounter Prescriptions as of 10/13/2016  Medication Sig  . aspirin 325 MG tablet Take 325 mg by mouth every other day. Reported on 08/20/2015  . DULoxetine (CYMBALTA) 60 MG capsule Take 60 mg by mouth 2 (two) times daily.   Marland Kitchen gabapentin (NEURONTIN) 300 MG capsule Take 300 mg by mouth 3 (three) times daily.   Marland Kitchen lamoTRIgine (LAMICTAL) 200 MG tablet Take 1 tablet (200 mg total) by mouth 2 (two) times daily.  Marland Kitchen LORazepam (ATIVAN) 0.5 MG tablet Take 1 tablet (0.5 mg total) by mouth 2 (two) times daily as needed.  Marland Kitchen omeprazole (PRILOSEC) 40 MG capsule TAKE ONE CAPSULE BY MOUTH ONCE DAILY     1. Annual physical exam  here for physical exam  2. Atrial fibrillation, unspecified type ([place])  No recent palpitations- stopped metoprolol- at last cardiology visit  3. Essential hypertension  Low sodium diet  4. Gastroesophageal reflux disease without esophagitis  Currently on omeprazole- no heart burn  5. Irritable bowel syndrome with constipation  Stays constipated most of the time- only takes something when she needs it  6. Bipolar disorder, in partial remission, most recent episode depressed ([place])  Has been taken off of lamictal but increased cymbalta to 41m BID  7. Insomnia due to stress  Not taking anything for sleep- just deals with it.  8. Obesity (BMI 30-39.9)  Has lost 35lbs in last 6 months  9.      DDD          Currently on neurotin which helps a lot with her chronic ack and leg pain.  New complaints: None today     Review of Systems  Constitutional: Negative for diaphoresis.  Eyes: Negative for pain.  Respiratory: Negative for shortness of breath.   Cardiovascular: Negative for chest pain, palpitations and leg swelling.  Gastrointestinal: Negative for abdominal pain.  Endocrine: Negative  for polydipsia.  Musculoskeletal: Positive for arthralgias and back pain.  Skin: Negative for rash.  Neurological: Negative for dizziness, weakness and headaches.  Hematological: Does not bruise/bleed easily.       Objective:   Physical Exam  Constitutional: She is oriented to person, place, and time. She appears well-developed and well-nourished.  HENT:  Head: Normocephalic.  Right Ear: Hearing, tympanic membrane, external ear and ear canal normal.  Left Ear: Hearing, tympanic membrane, external ear and ear canal normal.  Nose: Nose normal.  Mouth/Throat: Uvula is midline and oropharynx is clear and moist.  Eyes: Conjunctivae and EOM are normal. Pupils are equal, round, and reactive to light.  Neck: Normal range of motion and full passive range of motion without pain. Neck supple. No JVD present. Carotid bruit is not present. No thyroid mass and no thyromegaly present.  Cardiovascular: Normal rate, normal heart sounds and intact distal pulses.   No murmur heard. Pulmonary/Chest: Effort normal and breath sounds normal. Right breast exhibits no inverted nipple, no mass, no nipple discharge, no skin change and no tenderness. Left breast exhibits no inverted nipple, no mass, no nipple discharge, no skin change and no tenderness.  Abdominal: Soft. Bowel sounds are normal. She exhibits no mass. There is no tenderness.  Genitourinary: No breast swelling, tenderness, discharge or bleeding. Vaginal discharge found.  Genitourinary Comments: bimanual exam-No adnexal masses or tenderness. Vaginal  cuff intact  Musculoskeletal: Normal range of motion.  Lymphadenopathy:    She has no cervical adenopathy.  Neurological: She is alert and oriented to person, place, and time.  Skin: Skin is warm and dry.  Psychiatric: She has a normal mood and affect. Her behavior is normal. Judgment and thought content normal.   BP (!) 107/59   Pulse 65   Temp 97.3 F (36.3 C) (Oral)   Ht _0  (1.626 m)   Wt  202 lb (91.6 kg)   BMI 34.67 kg/m       Assessment & Plan:  1. Annual physical exam - Urinalysis, Complete - CBC with Differential/Platelet - Thyroid Panel With TSH - VITAMIN D 25 Hydroxy (Vit-D Deficiency, Fractures)  2. Atrial fibrillation, unspecified type ([place]) Avoid cafeine  3. Essential hypertension Low sodium diet - CMP14+EGFR - Lipid panel  4. Gastroesophageal reflux disease without esophagitis Avoid spicy foods Do not eat 2 hours prior to bedtime - omeprazole (PRILOSEC) 40 MG capsule; Take 1 capsule (40 mg total) by mouth daily.  Dispense: 30 capsule; Refill: 5  5. Irritable bowel syndrome with constipation  6. Bipolar disorder, in partial remission, most recent episode depressed ([place]) Continue counseling  7. Insomnia due to stress Bedtime routine  8. Obesity (BMI 30-39.9) Continue weight loss  9. DEGENERATIVE DISC DISEASE, LUMBOSACRAL SPINE - DG [facility] DEXA - DULoxetine (CYMBALTA) 60 MG capsule; Take 1 capsule (60 mg total) by mouth 2 (two) times daily.  Dispense: 30 capsule; Refill: 5 - gabapentin (NEURONTIN) 300 MG capsule; Take 1 capsule (300 mg total) by mouth 3 (three) times daily.  Dispense: 90 capsule; Refill: 5  10. Gynecologic exam normal - IGP, Aptima HPV, rfx 16/18,45  11. Need for hepatitis C screening test - Hepatitis C antibody  12. H/O screening mammography - MM Digital Screening; Future    Labs pending Health maintenance reviewed Diet and exercise encouraged Continue all meds Follow up  In 6 month  [place], FNP

## 2016-10-13 NOTE — Patient Instructions (Signed)
Stress and Stress Management Stress is a normal reaction to life events. It is what you feel when life demands more than you are used to or more than you can handle. Some stress can be useful. For example, the stress reaction can help you catch the last bus of the day, study for a test, or meet a deadline at work. But stress that occurs too often or for too long can cause problems. It can affect your emotional health and interfere with relationships and normal daily activities. Too much stress can weaken your immune system and increase your risk for physical illness. If you already have a medical problem, stress can make it worse. What are the causes? All sorts of life events may cause stress. An event that causes stress for one person may not be stressful for another person. Major life events commonly cause stress. These may be positive or negative. Examples include losing your job, moving into a new home, getting married, having a baby, or losing a loved one. Less obvious life events may also cause stress, especially if they occur day after day or in combination. Examples include working long hours, driving in traffic, caring for children, being in debt, or being in a difficult relationship. What are the signs or symptoms? Stress may cause emotional symptoms including, the following:  Anxiety. This is feeling worried, afraid, on edge, overwhelmed, or out of control.  Anger. This is feeling irritated or impatient.  Depression. This is feeling sad, down, helpless, or guilty.  Difficulty focusing, remembering, or making decisions. Stress may cause physical symptoms, including the following:  Aches and pains. These may affect your head, neck, back, stomach, or other areas of your body.  Tight muscles or clenched jaw.  Low energy or trouble sleeping. Stress may cause unhealthy behaviors, including the following:  Eating to feel better (overeating) or skipping meals.  Sleeping too little, too  much, or both.  Working too much or putting off tasks (procrastination).  Smoking, drinking alcohol, or using drugs to feel better. How is this diagnosed? Stress is diagnosed through an assessment by your health care provider. Your health care provider will ask questions about your symptoms and any stressful life events.Your health care provider will also ask about your medical history and may order blood tests or other tests. Certain medical conditions and medicine can cause physical symptoms similar to stress. Mental illness can cause emotional symptoms and unhealthy behaviors similar to stress. Your health care provider may refer you to a mental health professional for further evaluation. How is this treated? Stress management is the recommended treatment for stress.The goals of stress management are reducing stressful life events and coping with stress in healthy ways. Techniques for reducing stressful life events include the following:  Stress identification. Self-monitor for stress and identify what causes stress for you. These skills may help you to avoid some stressful events.  Time management. Set your priorities, keep a calendar of events, and learn to say "no." These tools can help you avoid making too many commitments. Techniques for coping with stress include the following:  Rethinking the problem. Try to think realistically about stressful events rather than ignoring them or overreacting. Try to find the positives in a stressful situation rather than focusing on the negatives.  Exercise. Physical exercise can release both physical and emotional tension. The key is to find a form of exercise you enjoy and do it regularly.  Relaxation techniques. These relax the body and mind. Examples include yoga,  meditation, tai chi, biofeedback, deep breathing, progressive muscle relaxation, listening to music, being out in nature, journaling, and other hobbies. Again, the key is to find one or  more that you enjoy and can do regularly.  Healthy lifestyle. Eat a balanced diet, get plenty of sleep, and do not smoke. Avoid using alcohol or drugs to relax.  Strong support network. Spend time with family, friends, or other people you enjoy being around.Express your feelings and talk things over with someone you trust. Counseling or talktherapy with a mental health professional may be helpful if you are having difficulty managing stress on your own. Medicine is typically not recommended for the treatment of stress.Talk to your health care provider if you think you need medicine for symptoms of stress. Follow these instructions at home:  Keep all follow-up visits as directed by your health care provider.  Take all medicines as directed by your health care provider. Contact a health care provider if:  Your symptoms get worse or you start having new symptoms.  You feel overwhelmed by your problems and can no longer manage them on your own. Get help right away if:  You feel like hurting yourself or someone else. This information is not intended to replace advice given to you by your health care provider. Make sure you discuss any questions you have with your health care provider. Document Released: 12/20/2000 Document Revised: 12/02/2015 Document Reviewed: 02/18/2013 Elsevier Interactive Patient Education  2017 [employer].

## 2016-10-14 LAB — CBC WITH DIFFERENTIAL/PLATELET
Basophils Absolute: 0.1 10*3/uL (ref 0.0–0.2)
Basos: 1 %
EOS (ABSOLUTE): 0.1 10*3/uL (ref 0.0–0.4)
Eos: 2 %
Hematocrit: 42 % (ref 34.0–46.6)
Hemoglobin: 14 g/dL (ref 11.1–15.9)
Immature Grans (Abs): 0 10*3/uL (ref 0.0–0.1)
Immature Granulocytes: 0 %
Lymphocytes Absolute: 1.6 10*3/uL (ref 0.7–3.1)
Lymphs: 22 %
MCH: 29.2 pg (ref 26.6–33.0)
MCHC: 33.3 g/dL (ref 31.5–35.7)
MCV: 88 fL (ref 79–97)
Monocytes Absolute: 0.8 10*3/uL (ref 0.1–0.9)
Monocytes: 11 %
Neutrophils Absolute: 4.7 10*3/uL (ref 1.4–7.0)
Neutrophils: 64 %
Platelets: 267 10*3/uL (ref 150–379)
RBC: 4.79 x10E6/uL (ref 3.77–5.28)
RDW: 14.1 % (ref 12.3–15.4)
WBC: 7.3 10*3/uL (ref 3.4–10.8)

## 2016-10-14 LAB — LIPID PANEL
Chol/HDL Ratio: 3.2 ratio (ref 0.0–4.4)
Cholesterol, Total: 193 mg/dL (ref 100–199)
HDL: 61 mg/dL (ref >39)
LDL Calculated: 105 mg/dL — ABNORMAL HIGH (ref 0–99)
Triglycerides: 137 mg/dL (ref 0–149)
VLDL Cholesterol Cal: 27 mg/dL (ref 5–40)

## 2016-10-14 LAB — CMP14+EGFR
ALT: 11 IU/L (ref 0–32)
AST: 17 IU/L (ref 0–40)
Albumin/Globulin Ratio: 1.6 (ref 1.2–2.2)
Albumin: 3.9 g/dL (ref 3.6–4.8)
Alkaline Phosphatase: 108 IU/L (ref 39–117)
BUN/Creatinine Ratio: 11 — ABNORMAL LOW (ref 12–28)
BUN: 8 mg/dL (ref 8–27)
Bilirubin Total: 0.5 mg/dL (ref 0.0–1.2)
CO2: 25 mmol/L (ref 18–29)
Calcium: 9.2 mg/dL (ref 8.7–10.3)
Chloride: 100 mmol/L (ref 96–106)
Creatinine, Ser: 0.71 mg/dL (ref 0.57–1.00)
GFR calc Af Amer: 105 mL/min/{1.73_m2} (ref >59)
GFR calc non Af Amer: 91 mL/min/{1.73_m2} (ref >59)
Globulin, Total: 2.4 g/dL (ref 1.5–4.5)
Glucose: 98 mg/dL (ref 65–99)
Potassium: 4.2 mmol/L (ref 3.5–5.2)
Sodium: 142 mmol/L (ref 134–144)
Total Protein: 6.3 g/dL (ref 6.0–8.5)

## 2016-10-14 LAB — VITAMIN D 25 HYDROXY (VIT D DEFICIENCY, FRACTURES): Vit D, 25-Hydroxy: 11.6 ng/mL — ABNORMAL LOW (ref 30.0–100.0)

## 2016-10-14 LAB — THYROID PANEL WITH TSH
Free Thyroxine Index: 1.8 (ref 1.2–4.9)
T3 Uptake Ratio: 24 % (ref 24–39)
T4, Total: 7.3 ug/dL (ref 4.5–12.0)
TSH: 3.09 u[IU]/mL (ref 0.450–4.500)

## 2016-10-14 LAB — HEPATITIS C ANTIBODY: Hep C Virus Ab: <0.1 s/co ratio (ref 0.0–0.9)

## 2016-10-16 ENCOUNTER — Telehealth: Payer: Self-pay | Admitting: Nurse Practitioner

## 2016-10-16 LAB — IGP, APTIMA HPV, RFX 16/18,45
HPV Aptima: NEGATIVE
PAP Smear Comment: 0

## 2016-10-16 NOTE — Telephone Encounter (Signed)
Returned patient phone with Dexa scan results.  Patient states that she can not take calcium.  Patient states that she also needs something sent to her pharmacy for allergies'

## 2016-10-17 DIAGNOSIS — F312 Bipolar disorder, current episode manic severe with psychotic features: Secondary | ICD-10-CM | POA: Diagnosis not present

## 2016-10-17 DIAGNOSIS — F4310 Post-traumatic stress disorder, unspecified: Secondary | ICD-10-CM | POA: Diagnosis not present

## 2016-10-17 DIAGNOSIS — F410 Panic disorder [episodic paroxysmal anxiety] without agoraphobia: Secondary | ICD-10-CM | POA: Diagnosis not present

## 2016-10-20 ENCOUNTER — Telehealth: Payer: Self-pay | Admitting: Nurse Practitioner

## 2016-10-20 NOTE — Telephone Encounter (Signed)
OTC vitain d 2000iu is fine to take daily

## 2016-10-20 NOTE — Telephone Encounter (Signed)
Pt aware of lab results. She was not able to completely view these in MyChart yet.

## 2016-10-23 NOTE — Telephone Encounter (Signed)
Patient aware and verbalizes understanding. 

## 2016-11-09 ENCOUNTER — Ambulatory Visit: Payer: BLUE CROSS/BLUE SHIELD

## 2016-11-14 DIAGNOSIS — F4310 Post-traumatic stress disorder, unspecified: Secondary | ICD-10-CM | POA: Diagnosis not present

## 2016-11-14 DIAGNOSIS — F410 Panic disorder [episodic paroxysmal anxiety] without agoraphobia: Secondary | ICD-10-CM | POA: Diagnosis not present

## 2016-11-14 DIAGNOSIS — F312 Bipolar disorder, current episode manic severe with psychotic features: Secondary | ICD-10-CM | POA: Diagnosis not present

## 2016-11-23 DIAGNOSIS — J0100 Acute maxillary sinusitis, unspecified: Secondary | ICD-10-CM | POA: Diagnosis not present

## 2016-11-23 DIAGNOSIS — J4531 Mild persistent asthma with (acute) exacerbation: Secondary | ICD-10-CM | POA: Diagnosis not present

## 2016-11-23 DIAGNOSIS — G4733 Obstructive sleep apnea (adult) (pediatric): Secondary | ICD-10-CM | POA: Diagnosis not present

## 2016-11-23 DIAGNOSIS — R0602 Shortness of breath: Secondary | ICD-10-CM | POA: Diagnosis not present

## 2016-11-23 DIAGNOSIS — K449 Diaphragmatic hernia without obstruction or gangrene: Secondary | ICD-10-CM | POA: Diagnosis not present

## 2016-12-07 ENCOUNTER — Encounter: Payer: Self-pay | Admitting: Neurology

## 2016-12-07 ENCOUNTER — Ambulatory Visit (INDEPENDENT_AMBULATORY_CARE_PROVIDER_SITE_OTHER): Payer: BLUE CROSS/BLUE SHIELD | Admitting: Neurology

## 2016-12-07 VITALS — BP 142/76 | HR 82 | Resp 16 | Ht 64.0 in | Wt 194.0 lb

## 2016-12-07 DIAGNOSIS — R2689 Other abnormalities of gait and mobility: Secondary | ICD-10-CM

## 2016-12-07 DIAGNOSIS — Z9181 History of falling: Secondary | ICD-10-CM

## 2016-12-07 DIAGNOSIS — R296 Repeated falls: Secondary | ICD-10-CM | POA: Diagnosis not present

## 2016-12-07 NOTE — Progress Notes (Signed)
Subjective:    Patient ID: Doris Smith is a 66 y.o. female.  HPI     Star Age, MD, PhD [facility] [address] [place], [place] [address]  Dear Dr. Percival Spanish,   I saw your patient, Doris Smith, upon your kind request in my neurologic clinic today for consultation of her gait disorder and falls. The patient is unaccompanied today. As you know, [name] is a 66 year old right-handed woman with an underlying medical history of sleep apnea, IBS, recurrent headaches, reflux disease, fibromyalgia, fatty liver, anxiety, depression, asthma, A. fib, degenerative lower back disease with status post lumbar spine surgery, arthritis, with status post left total knee replacement, and obesity, who reports increase in falls in the past year and Gait and balance issues and falls for the past nearly 3 years. I have previously seen her for a similar problem over 2 and half years ago. At that time, I felt she had a multifactorial gait disorder, secondary to sleep deprivation, untreated OSA, dictation side effects secondary to sedating medications, arthritis and obesity. She was advised to stay better hydrated with water and reduce her caffeine intake. She was advised to start using a cane for gait safety and I suggested proceeding with a brain MRI. We talked about potentially doing physical therapy for gait and balance training. She had a brain MRI without contrast on 04/17/2014 which showed normal findings for age. I reviewed your office note from 07/04/2016. For her asthma and sleep apnea she has been seen by a [facility] on 11/23/2016 and I reviewed the note. She reports having had 3 falls in November 2017 and was seen at [facility] ED. I reviewed records from [facility], admission from 05/15/2016 through 05/18/2016 under general medicine. Discharge diagnosis was postural dizziness with near syncope. She had a head CT and cervical spine CT without contrast on  05/15/2016 which showed: 1. No acute intracranial abnormality. 2. No cervical spine acute fracture. There is mild anterolisthesis about 3 mm C3 on C4 vertebral body. Degenerative changes as described above. No prevertebral soft tissue swelling. Cervical airway is patent.   She was found to have low vitamin B12 and was started on B12 injections. She was discharged to home with home health therapies. She was deemed at high fall risk secondary to chronic low back pain, degenerative disc disease, osteoarthritis. Secondary to her fall risk she was not deemed an appropriate candidate for anticoagulation for her paroxysmal A. fib. She was advised to continue aspirin. She was advised to stop taking lorazepam. She was started on atorvastatin.  She did not have an MRI brain in November. She did not have [facility] therapies. She was given a walker, but does not use it. She lives in [place] at an apartment. After her husband's retirement, they will be moving back to [place]. She is followed by [facility]. She is taking lorazepam as needed, not daily.  Reports problem with L knee and now also R knee. Planning to see Dr. Veverly Fells again, also c/o LBP, radiating to L, planning to see Dr. Tonita Cong. Has new glasses.  Feels, her mood medication is not helping, no longer on lamictal.  She drinks half-and-half sweet tea throughout the day, maybe 2, sometimes 3 bottles of water per day on average.  Previously (copied from previous notes for reference):   04/13/2014: [name] is a 66 year old right-handed woman with an underlying medical history of reflux disease and irritable bowel syndrome, recurrent headaches, anxiety, history of  bipolar disorder, atrial fibrillation, asthma and obstructive sleep apnea, status post tonsillectomy, status post hysterectomy, degenerative lower back disease, status post lumbar laminectomy in November 2012 (Dr. Tonita Cong), degenerative arthritis of her knee, status post left total knee arthroplasty in  June 2007 (Dr. Veverly Fells), who reports having gait and balance issues for several months, worse in the last 2-3 months. She has fallen. 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  She has been using a cane for some time, probably since the knee. She has fallen. The cane helps when she goes out, at home she does not use it. She feels, that she is pulled in one direction. She has occasional vertigo. She has been drinking a lot of green tea, she does not use her CPAP. She has not done PT recently.   Her Past Medical History Is Significant For: Past Medical History:  Diagnosis Date  . A-fib ([place]) 11/2012  . Anxiety    takes Ativan  . Asthma 04/07/2011   dx  . Depression   . Fatty liver   . Fibromyalgia   . Fracture, ankle 10/2008   right and left  . GERD (gastroesophageal reflux disease)   . Headache(784.0)    migraines  . Irritable bowel syndrome   . Mental disorder    dx bipolar  . PONV (postoperative nausea and vomiting)   . Sleep apnea 04/2011    No CPAP    Her Past Surgical History Is Significant For: Past Surgical History:  Procedure Laterality Date  . DILATION AND CURETTAGE OF UTERUS  1981   abnormal pap  . KNEE ARTHROSCOPY Left 2007   x 2  . LUMBAR LAMINECTOMY/DECOMPRESSION MICRODISCECTOMY  05/31/2011   Procedure: LUMBAR LAMINECTOMY/DECOMPRESSION MICRODISCECTOMY;  Surgeon: Johnn Hai;  Location: [facility] ORS;  Service: Orthopedics;  Laterality: Left;  Decompression Lumbar four to five and  Lumbar five to Sacral one on Left  (X-Ray)  . PARTIAL HYSTERECTOMY  1982  . TONSILLECTOMY     as child  . TOTAL KNEE ARTHROPLASTY Left 12/18/2005    Her Family History Is Significant For: Family History  Problem Relation Age of Onset  . Anesthesia problems Mother   . Heart disease Mother        CHF, atrial fib  . Heart failure Mother   . Anesthesia problems Sister   . Colon polyps Father   . Heart disease Father        "Fluid around the heart"  . Hypertension Sister     Her Social  History Is Significant For: Social History   Social History  . Marital status: Married    Spouse name: N/A  . Number of children: 2  . Years of education: 12   Occupational History  . Disabled    Social History Main Topics  . Smoking status: Never Smoker  . Smokeless tobacco: Never Used  . Alcohol use No  . Drug use: No  . Sexual activity: Yes    Birth control/ protection: Post-menopausal   Other Topics Concern  . None   Social History Narrative   Tea daily.  Rarely has caffeine     Her Allergies Are:  Allergies  Allergen Reactions  . Iohexol     "over 20 years ago" had reaction "hurting in arm and heart"-Done in [place], [place].Marland Kitchenphysician stopped CT at that time.  . Codeine Other (See Comments)    Makes feel like she is wild   . Sulfonamide Derivatives Other (See Comments)    Upset stomach  .  Celecoxib Nausea And Vomiting  . Vitamin B12 Rash  :   Her Current Medications Are:  Outpatient Encounter Prescriptions as of 12/07/2016  Medication Sig  . aspirin 325 MG tablet Take 325 mg by mouth every other day. Reported on 08/20/2015  . DULoxetine (CYMBALTA) 60 MG capsule Take 1 capsule (60 mg total) by mouth 2 (two) times daily.  Marland Kitchen gabapentin (NEURONTIN) 300 MG capsule Take 1 capsule (300 mg total) by mouth 3 (three) times daily.  Marland Kitchen LORazepam (ATIVAN) 0.5 MG tablet Take 1 tablet (0.5 mg total) by mouth 2 (two) times daily as needed.  Marland Kitchen omeprazole (PRILOSEC) 40 MG capsule Take 1 capsule (40 mg total) by mouth daily.   No facility-administered encounter medications on file as of 12/07/2016.   : Review of Systems:  Out of a complete 14 point review of systems, all are reviewed and negative with the exception of these symptoms as listed below:  Review of Systems  Neurological: Positive for headaches.       Patient states that she is "falling too much". Reports while she is trying to walk straight, she will bump into walls.  Patient had 3 falls in November and was seen in [facility] ED.   Patient reports that her falls happen in "spells". States that right now she is not doing "too bad".  Patient is asking to be checked for MS     Objective:  Neurologic Exam  Physical Exam Physical Examination:   Vitals:   12/07/16 1115  BP: (!) 142/76  Pulse: 82  Resp: 16   General Examination: The patient is a very pleasant 65 y.o. female in no acute distress. She appears well-developed and well-nourished and well groomed. Tearful throughout visit. Mildly lightheaded upon standing, no vertigo Sx.   HEENT: Normocephalic, atraumatic, pupils are equal, round and reactive to light and accommodation. Funduscopic exam is normal with sharp disc margins noted. Extraocular tracking is good without limitation to gaze excursion or nystagmus noted. Normal smooth pursuit is noted. Hearing is grossly intact. Face is symmetric with normal facial animation and normal facial sensation. Speech is clear with no dysarthria noted. There is no hypophonia. There is no lip, neck/head, jaw or voice tremor. Neck is supple with full range of passive and active motion. There are no carotid bruits on auscultation. Oropharynx exam reveals: moderate mouth dryness, adequate dental hygiene and moderate airway crowding.   Chest: Clear to auscultation without wheezing, rhonchi or crackles noted.  Heart: S1+S2+0, regular and normal without murmurs, rubs or gallops noted.   Abdomen: Soft, non-tender and non-distended with normal bowel sounds appreciated on auscultation.  Extremities: There is no pitting edema in the distal lower extremities bilaterally. Pedal pulses are intact.  Skin: Warm and dry without trophic changes noted. There are no varicose veins.  Musculoskeletal: exam reveals: b/l knee pain, R knee is mildly swollen, reports midline LBP.   Neurologically:  Mental status: The patient is awake, alert and oriented in all 4 spheres. Her immediate and remote memory, attention, language skills and fund of  knowledge are appropriate. There is no evidence of aphasia, agnosia, apraxia or anomia. Speech is clear with normal prosody and enunciation. Thought process is linear. Mood is constricted and affect is blunted.  Cranial nerves II - XII are as described above under HEENT exam. In addition: shoulder shrug is normal with equal shoulder height noted. Motor exam: Normal bulk, strength and tone is noted. There is no drift, tremor or rebound. Romberg is negative. Reflexes are  2+ throughout. Babinski: Toes are flexor bilaterally. Fine motor skills and coordination: intact with normal finger taps, normal hand movements, normal rapid alternating patting, normal foot taps and normal foot agility.  Cerebellar testing: No dysmetria or intention tremor on finger to nose testing. Heel to shin is unremarkable bilaterally. There is no truncal or gait ataxia.  Sensory exam: intact to light touch, pinprick, vibration, temperature sense in the upper and lower extremities.  Gait, station and balance: She stands easily but reports some pain in both knees and lower back, non-radiating. No veering to one side is noted. No leaning to one side is noted. Posture is age-appropriate and stance is slightly wide based. Gait shows normal stride length and slightly cautious, using a single point cane.               Assessment and Plan:   In summary, WEALTHY DANIELSKI is a very pleasant 66 year old female with an underlying medical history of reflux disease and irritable bowel syndrome, recurrent headaches, anxiety, history of bipolar disorder, atrial fibrillation, asthma and obstructive sleep apnea, status post tonsillectomy, status post hysterectomy, degenerative lower back disease, status post lumbar laminectomy in November 2012 (Dr. Tonita Cong), degenerative arthritis of her knee, status post left total knee arthroplasty in June 2007 (Dr. Veverly Fells), who Presents for evaluation of her gait and balance problems of nearly 3 years duration,  recurrent falls. She had a hospital admission in November 2017 after a fall. She was advised to stop Ativan at the time and was sent home with a walker which she currently does not use. She still utilizes Ativan but very rarely she reports. She reports residual depression and is quite tearful today. She follows with [facility] counseling. She had an unremarkable brain MRI in October 2015. I suggested we repeat her brain scan with and without contrast to rule out structural lesions but overall, I believe, her gait disorder and balance problems, recurrent falls are multifactorial in nature. She is advised that contributing factors are blood pressure fluctuations, tendency towards under hydration, medication side effects from potentially sedating medications, degenerative back disease, arthritis and joint replacement are all contributor is most likely. Nevertheless, we will proceed with a brain MRI with and without contrast and call her with her test results. She is advised to start using her walker. She is furthermore advised to follow-up with her mental health provider for optimization of her mood medication regimen. She is also advised to discuss with her primary care physician a referral to outpatient physical therapy. She reports that she did not have home health therapy upon discharge from the hospital last year. We will call her with her MRI results and I will see her back on an as-needed basis. I answered all her questions today and she was in agreement with the plan.   Thank you very much for allowing me to participate in the care of this nice patient. If I can be of any further assistance to you please do not hesitate to call me at [phone].  Sincerely,   Star Age, MD, PhD

## 2016-12-07 NOTE — Patient Instructions (Addendum)
I believe you have a gait disorder, which likely is due to a combination of things: aging, degenerative arthritis of your back, arthritis, previous falls, and medication effect from potentially sedating medications.   Remember to drink plenty of fluid, eat healthy meals and do not skip any meals. Try to eat protein with a every meal and eat a healthy snack such as fruit or nuts in between meals. Try to keep a regular sleep-wake schedule and try to exercise daily, particularly in the form of walking, 20-30 minutes a day, if you can. Change positions slowly and you should start using your walker.   We will repeat your brain MRI and compare with findings from 2015. We will have to schedule you for this on a separate date. This test requires authorization from your insurance, and we will take care of the insurance process.   Please talk to your primary care about doing outpatient physical therapy in your area.

## 2016-12-19 ENCOUNTER — Other Ambulatory Visit: Payer: Self-pay | Admitting: Nurse Practitioner

## 2016-12-19 DIAGNOSIS — K219 Gastro-esophageal reflux disease without esophagitis: Secondary | ICD-10-CM

## 2016-12-20 DIAGNOSIS — F312 Bipolar disorder, current episode manic severe with psychotic features: Secondary | ICD-10-CM | POA: Diagnosis not present

## 2016-12-20 DIAGNOSIS — F410 Panic disorder [episodic paroxysmal anxiety] without agoraphobia: Secondary | ICD-10-CM | POA: Diagnosis not present

## 2016-12-20 DIAGNOSIS — F4310 Post-traumatic stress disorder, unspecified: Secondary | ICD-10-CM | POA: Diagnosis not present

## 2017-01-09 DIAGNOSIS — F4310 Post-traumatic stress disorder, unspecified: Secondary | ICD-10-CM | POA: Diagnosis not present

## 2017-01-09 DIAGNOSIS — F312 Bipolar disorder, current episode manic severe with psychotic features: Secondary | ICD-10-CM | POA: Diagnosis not present

## 2017-01-09 DIAGNOSIS — F410 Panic disorder [episodic paroxysmal anxiety] without agoraphobia: Secondary | ICD-10-CM | POA: Diagnosis not present

## 2017-01-23 DIAGNOSIS — J309 Allergic rhinitis, unspecified: Secondary | ICD-10-CM | POA: Diagnosis not present

## 2017-01-23 DIAGNOSIS — R05 Cough: Secondary | ICD-10-CM | POA: Diagnosis not present

## 2017-01-23 DIAGNOSIS — K449 Diaphragmatic hernia without obstruction or gangrene: Secondary | ICD-10-CM | POA: Diagnosis not present

## 2017-01-23 DIAGNOSIS — R1110 Vomiting, unspecified: Secondary | ICD-10-CM | POA: Diagnosis not present

## 2017-02-13 DIAGNOSIS — F312 Bipolar disorder, current episode manic severe with psychotic features: Secondary | ICD-10-CM | POA: Diagnosis not present

## 2017-02-13 DIAGNOSIS — F4310 Post-traumatic stress disorder, unspecified: Secondary | ICD-10-CM | POA: Diagnosis not present

## 2017-02-13 DIAGNOSIS — F410 Panic disorder [episodic paroxysmal anxiety] without agoraphobia: Secondary | ICD-10-CM | POA: Diagnosis not present

## 2017-02-20 DIAGNOSIS — H524 Presbyopia: Secondary | ICD-10-CM | POA: Diagnosis not present

## 2017-02-20 DIAGNOSIS — H40003 Preglaucoma, unspecified, bilateral: Secondary | ICD-10-CM | POA: Diagnosis not present

## 2017-02-20 DIAGNOSIS — H2513 Age-related nuclear cataract, bilateral: Secondary | ICD-10-CM | POA: Diagnosis not present

## 2017-02-20 DIAGNOSIS — H43393 Other vitreous opacities, bilateral: Secondary | ICD-10-CM | POA: Diagnosis not present

## 2017-02-21 DIAGNOSIS — Z882 Allergy status to sulfonamides status: Secondary | ICD-10-CM | POA: Diagnosis not present

## 2017-02-21 DIAGNOSIS — Z885 Allergy status to narcotic agent status: Secondary | ICD-10-CM | POA: Diagnosis not present

## 2017-02-21 DIAGNOSIS — K219 Gastro-esophageal reflux disease without esophagitis: Secondary | ICD-10-CM | POA: Diagnosis not present

## 2017-02-21 DIAGNOSIS — F419 Anxiety disorder, unspecified: Secondary | ICD-10-CM | POA: Diagnosis not present

## 2017-02-21 DIAGNOSIS — Z825 Family history of asthma and other chronic lower respiratory diseases: Secondary | ICD-10-CM | POA: Diagnosis not present

## 2017-02-21 DIAGNOSIS — I4891 Unspecified atrial fibrillation: Secondary | ICD-10-CM | POA: Diagnosis not present

## 2017-02-21 DIAGNOSIS — K449 Diaphragmatic hernia without obstruction or gangrene: Secondary | ICD-10-CM | POA: Diagnosis not present

## 2017-02-21 DIAGNOSIS — J4520 Mild intermittent asthma, uncomplicated: Secondary | ICD-10-CM | POA: Diagnosis not present

## 2017-02-21 DIAGNOSIS — G479 Sleep disorder, unspecified: Secondary | ICD-10-CM | POA: Diagnosis not present

## 2017-02-21 DIAGNOSIS — R002 Palpitations: Secondary | ICD-10-CM | POA: Diagnosis not present

## 2017-02-21 DIAGNOSIS — Z7982 Long term (current) use of aspirin: Secondary | ICD-10-CM | POA: Diagnosis not present

## 2017-02-21 DIAGNOSIS — I10 Essential (primary) hypertension: Secondary | ICD-10-CM | POA: Diagnosis not present

## 2017-02-21 DIAGNOSIS — R0602 Shortness of breath: Secondary | ICD-10-CM | POA: Diagnosis not present

## 2017-02-21 DIAGNOSIS — F319 Bipolar disorder, unspecified: Secondary | ICD-10-CM | POA: Diagnosis not present

## 2017-02-21 DIAGNOSIS — I700 Atherosclerosis of aorta: Secondary | ICD-10-CM | POA: Diagnosis not present

## 2017-02-21 DIAGNOSIS — R0789 Other chest pain: Secondary | ICD-10-CM | POA: Diagnosis not present

## 2017-03-01 DIAGNOSIS — F4310 Post-traumatic stress disorder, unspecified: Secondary | ICD-10-CM | POA: Diagnosis not present

## 2017-03-01 DIAGNOSIS — F410 Panic disorder [episodic paroxysmal anxiety] without agoraphobia: Secondary | ICD-10-CM | POA: Diagnosis not present

## 2017-03-01 DIAGNOSIS — F312 Bipolar disorder, current episode manic severe with psychotic features: Secondary | ICD-10-CM | POA: Diagnosis not present

## 2017-03-07 ENCOUNTER — Encounter: Payer: BLUE CROSS/BLUE SHIELD | Admitting: *Deleted

## 2017-03-29 DIAGNOSIS — F29 Unspecified psychosis not due to a substance or known physiological condition: Secondary | ICD-10-CM | POA: Diagnosis not present

## 2017-03-29 DIAGNOSIS — F410 Panic disorder [episodic paroxysmal anxiety] without agoraphobia: Secondary | ICD-10-CM | POA: Diagnosis not present

## 2017-03-29 DIAGNOSIS — I480 Paroxysmal atrial fibrillation: Secondary | ICD-10-CM | POA: Diagnosis not present

## 2017-03-29 DIAGNOSIS — F419 Anxiety disorder, unspecified: Secondary | ICD-10-CM | POA: Diagnosis not present

## 2017-03-29 DIAGNOSIS — I4891 Unspecified atrial fibrillation: Secondary | ICD-10-CM | POA: Diagnosis not present

## 2017-03-29 DIAGNOSIS — R0602 Shortness of breath: Secondary | ICD-10-CM | POA: Diagnosis not present

## 2017-03-29 DIAGNOSIS — Z7982 Long term (current) use of aspirin: Secondary | ICD-10-CM | POA: Diagnosis not present

## 2017-03-29 DIAGNOSIS — Z8659 Personal history of other mental and behavioral disorders: Secondary | ICD-10-CM | POA: Diagnosis not present

## 2017-03-29 DIAGNOSIS — R002 Palpitations: Secondary | ICD-10-CM | POA: Diagnosis not present

## 2017-03-29 DIAGNOSIS — I482 Chronic atrial fibrillation: Secondary | ICD-10-CM | POA: Diagnosis not present

## 2017-03-29 DIAGNOSIS — R000 Tachycardia, unspecified: Secondary | ICD-10-CM | POA: Diagnosis not present

## 2017-03-29 DIAGNOSIS — R0789 Other chest pain: Secondary | ICD-10-CM | POA: Diagnosis not present

## 2017-03-29 DIAGNOSIS — F430 Acute stress reaction: Secondary | ICD-10-CM | POA: Diagnosis not present

## 2017-03-29 DIAGNOSIS — R064 Hyperventilation: Secondary | ICD-10-CM | POA: Diagnosis not present

## 2017-03-29 DIAGNOSIS — R079 Chest pain, unspecified: Secondary | ICD-10-CM | POA: Diagnosis not present

## 2017-04-10 DIAGNOSIS — F410 Panic disorder [episodic paroxysmal anxiety] without agoraphobia: Secondary | ICD-10-CM | POA: Diagnosis not present

## 2017-04-10 DIAGNOSIS — F4310 Post-traumatic stress disorder, unspecified: Secondary | ICD-10-CM | POA: Diagnosis not present

## 2017-04-10 DIAGNOSIS — F312 Bipolar disorder, current episode manic severe with psychotic features: Secondary | ICD-10-CM | POA: Diagnosis not present

## 2017-04-18 ENCOUNTER — Ambulatory Visit: Payer: BLUE CROSS/BLUE SHIELD | Admitting: Nurse Practitioner

## 2017-04-25 ENCOUNTER — Ambulatory Visit: Payer: BLUE CROSS/BLUE SHIELD | Admitting: Physician Assistant

## 2017-05-03 ENCOUNTER — Ambulatory Visit: Payer: BLUE CROSS/BLUE SHIELD | Admitting: Nurse Practitioner

## 2017-05-03 ENCOUNTER — Encounter: Payer: Self-pay | Admitting: Nurse Practitioner

## 2017-05-10 ENCOUNTER — Other Ambulatory Visit: Payer: Self-pay | Admitting: Family Medicine

## 2017-05-10 DIAGNOSIS — K219 Gastro-esophageal reflux disease without esophagitis: Secondary | ICD-10-CM

## 2017-05-11 ENCOUNTER — Other Ambulatory Visit: Payer: Self-pay

## 2017-05-11 DIAGNOSIS — K219 Gastro-esophageal reflux disease without esophagitis: Secondary | ICD-10-CM

## 2017-05-11 MED ORDER — OMEPRAZOLE 40 MG PO CPDR: 40.0000 mg | DELAYED_RELEASE_CAPSULE | Freq: Every day | ORAL | 5 refills | Status: DC

## 2017-05-11 NOTE — Telephone Encounter (Signed)
Last  Seen 10/23/16

## 2017-05-22 DIAGNOSIS — F4310 Post-traumatic stress disorder, unspecified: Secondary | ICD-10-CM | POA: Diagnosis not present

## 2017-05-22 DIAGNOSIS — F410 Panic disorder [episodic paroxysmal anxiety] without agoraphobia: Secondary | ICD-10-CM | POA: Diagnosis not present

## 2017-05-22 DIAGNOSIS — F312 Bipolar disorder, current episode manic severe with psychotic features: Secondary | ICD-10-CM | POA: Diagnosis not present

## 2017-06-12 DIAGNOSIS — F312 Bipolar disorder, current episode manic severe with psychotic features: Secondary | ICD-10-CM | POA: Diagnosis not present

## 2017-06-12 DIAGNOSIS — F410 Panic disorder [episodic paroxysmal anxiety] without agoraphobia: Secondary | ICD-10-CM | POA: Diagnosis not present

## 2017-06-12 DIAGNOSIS — F4310 Post-traumatic stress disorder, unspecified: Secondary | ICD-10-CM | POA: Diagnosis not present

## 2017-06-13 ENCOUNTER — Encounter: Payer: Self-pay | Admitting: Gastroenterology

## 2017-06-13 ENCOUNTER — Ambulatory Visit (INDEPENDENT_AMBULATORY_CARE_PROVIDER_SITE_OTHER): Payer: BLUE CROSS/BLUE SHIELD | Admitting: Gastroenterology

## 2017-06-13 VITALS — BP 110/60 | HR 64 | Ht 64.0 in | Wt 207.0 lb

## 2017-06-13 DIAGNOSIS — K449 Diaphragmatic hernia without obstruction or gangrene: Secondary | ICD-10-CM | POA: Diagnosis not present

## 2017-06-13 DIAGNOSIS — K5909 Other constipation: Secondary | ICD-10-CM | POA: Diagnosis not present

## 2017-06-13 DIAGNOSIS — R1110 Vomiting, unspecified: Secondary | ICD-10-CM

## 2017-06-13 DIAGNOSIS — R1319 Other dysphagia: Secondary | ICD-10-CM

## 2017-06-13 DIAGNOSIS — R1013 Epigastric pain: Secondary | ICD-10-CM

## 2017-06-13 DIAGNOSIS — K582 Mixed irritable bowel syndrome: Secondary | ICD-10-CM | POA: Diagnosis not present

## 2017-06-13 DIAGNOSIS — K219 Gastro-esophageal reflux disease without esophagitis: Secondary | ICD-10-CM | POA: Diagnosis not present

## 2017-06-13 NOTE — Patient Instructions (Signed)
You have been scheduled for an endoscopy. Please follow written instructions given to you at your visit today. If you use inhalers (even only as needed), please bring them with you on the day of your procedure.   We will give you Amitiza sample 8 mcg to take twice a day to try  Take Benefiber 1 tablespoon  Three times a day with meals   Increase fluid intake 8-10 cups per day  Eat small frequent meals   Constipation, Adult Constipation is when a person has fewer bowel movements in a week than normal, has difficulty having a bowel movement, or has stools that are dry, hard, or larger than normal. Constipation may be caused by an underlying condition. It may become worse with age if a person takes certain medicines and does not take in enough fluids. Follow these instructions at home: Eating and drinking   Eat foods that have a lot of fiber, such as fresh fruits and vegetables, whole grains, and beans.  Limit foods that are high in fat, low in fiber, or overly processed, such as french fries, hamburgers, cookies, candies, and soda.  Drink enough fluid to keep your urine clear or pale yellow. General instructions  Exercise regularly or as told by your health care provider.  Go to the restroom when you have the urge to go. Do not hold it in.  Take over-the-counter and prescription medicines only as told by your health care provider. These include any fiber supplements.  Practice pelvic floor retraining exercises, such as deep breathing while relaxing the lower abdomen and pelvic floor relaxation during bowel movements.  Watch your condition for any changes.  Keep all follow-up visits as told by your health care provider. This is important. Contact a health care provider if:  You have pain that gets worse.  You have a fever.  You do not have a bowel movement after 4 days.  You vomit.  You are not hungry.  You lose weight.  You are bleeding from the anus.  You have  thin, pencil-like stools. Get help right away if:  You have a fever and your symptoms suddenly get worse.  You leak stool or have blood in your stool.  Your abdomen is bloated.  You have severe pain in your abdomen.  You feel dizzy or you faint. This information is not intended to replace advice given to you by your health care provider. Make sure you discuss any questions you have with your health care provider. Document Released: 03/24/2004 Document Revised: 01/14/2016 Document Reviewed: 12/15/2015 Elsevier Interactive Patient Education  2017 [place] for Gastroesophageal Reflux Disease, Adult When you have gastroesophageal reflux disease (GERD), the foods you eat and your eating habits are very important. Choosing the right foods can help ease your discomfort. What guidelines do I need to follow? Choose fruits, vegetables, whole grains, and low-fat dairy products. Choose low-fat meat, fish, and poultry. Limit fats such as oils, salad dressings, butter, nuts, and avocado. Keep a food diary. This helps you identify foods that cause symptoms. Avoid foods that cause symptoms. These may be different for everyone. Eat small meals often instead of 3 large meals a day. Eat your meals slowly, in a place where you are relaxed. Limit fried foods. Cook foods using methods other than frying. Avoid drinking alcohol. Avoid drinking large amounts of liquids with your meals. Avoid bending over or lying down until 2-3 hours after eating. What foods are not recommended? These are  some foods and drinks that may make your symptoms worse: Vegetables Tomatoes. Tomato juice. Tomato and spaghetti sauce. Chili peppers. Onion and garlic. Horseradish. Fruits Oranges, grapefruit, and lemon (fruit and juice). Meats High-fat meats, fish, and poultry. This includes hot dogs, ribs, ham, sausage, salami, and bacon. Dairy Whole milk and chocolate milk. Sour cream. Cream. Butter. Ice  cream. Cream cheese. Drinks Coffee and tea. Bubbly (carbonated) drinks or energy drinks. Condiments Hot sauce. Barbecue sauce. Sweets/Desserts Chocolate and cocoa. Donuts. Peppermint and spearmint. Fats and Oils High-fat foods. This includes [place] fries and potato chips. Other Vinegar. Strong spices. This includes black pepper, white pepper, red pepper, cayenne, curry powder, cloves, ginger, and chili powder. The items listed above may not be a complete list of foods and drinks to avoid. Contact your dietitian for more information. This information is not intended to replace advice given to you by your health care provider. Make sure you discuss any questions you have with your health care provider. Document Released: 12/26/2011 Document Revised: 12/02/2015 Document Reviewed: 04/30/2013 Elsevier Interactive Patient Education  2017 [employer].

## 2017-06-13 NOTE — Progress Notes (Signed)
Doris Smith    [id number]    24-Mar-1951  Primary Care Physician:Martin, [name], FNP  Referring Physician: Chevis Pretty, [place], [place]  [id number]  Chief complaint: Epigastric abdominal pain, constipation alternating with diarrhea  HPI: 66 year old female previously followed by Dr. Olevia Perches, last seen in January 23, 2014 is here to reestablish care. Patient complains of intermittent epigastric abdominal pain worsening in the past 1 year.  She has difficulty swallowing, has to take multiple swallows to get food down and has trouble with pills on most days.  She also vomits almost daily, it is worse on some days where she cannot keep anything down.  She feels anxiety/panic attacks make her symptoms worse.  She has a moderate size hiatal hernia based on recent CT abdomen pelvis in 2014.  EGD in 2009 showed mild gastritis negative for H. Pylori.  Abdominal ultrasound and HIDA scan were negative for gallbladder disease in 2009.  Last colonoscopy in 2009 with random biopsies to rule out microscopic colitis was normal.  According to patient her weight has been fluctuating, she lost over 40 pounds within the past 1 year, has gained back about 10 pounds in a few months. She has irregular bowel habits with no bowel movement for 2-3 days followed by multiple fragmented formed hard to soft stool.  She was on Amitiza in the past, no longer taking it as she felt that it stopped working Denies any melena or blood in stool. No family history of colon cancer or IBD   Outpatient Encounter Medications as of 06/13/2017  Medication Sig  . aspirin 325 MG tablet Take 325 mg by mouth every other day. Reported on 08/20/2015  . FLUoxetine (PROZAC) 20 MG capsule Take 1 capsule by mouth 2 (two) times daily.  Marland Kitchen gabapentin (NEURONTIN) 300 MG capsule Take 1 capsule (300 mg total) by mouth 3 (three) times daily.  Marland Kitchen lamoTRIgine (LAMICTAL) 200 MG tablet Take 100 mg by mouth 2  (two) times daily.  Marland Kitchen LORazepam (ATIVAN) 0.5 MG tablet Take 1 tablet (0.5 mg total) by mouth 2 (two) times daily as needed.  Marland Kitchen omeprazole (PRILOSEC) 40 MG capsule Take 1 capsule (40 mg total) by mouth daily.  . [DISCONTINUED] DULoxetine (CYMBALTA) 60 MG capsule Take 1 capsule (60 mg total) by mouth 2 (two) times daily.   No facility-administered encounter medications on file as of 06/13/2017.     Allergies as of 06/13/2017 - Review Complete 06/13/2017  Allergen Reaction Noted  . Iohexol  11/06/2012  . Codeine Other (See Comments) 05/14/2008  . Sulfonamide derivatives Other (See Comments) 05/14/2008  . Celecoxib Nausea And Vomiting 12/10/2015  . Vitamin b12 Rash 11/05/2012    Past Medical History:  Diagnosis Date  . A-fib ([place]) 11/2012  . Anxiety    takes Ativan  . Asthma 04/07/2011   dx  . Depression   . Fatty liver   . Fibromyalgia   . Fracture, ankle 10/2008   right and left  . GERD (gastroesophageal reflux disease)   . Headache(784.0)    migraines  . Irritable bowel syndrome   . Mental disorder    dx bipolar  . PONV (postoperative nausea and vomiting)   . Sleep apnea 04/2011    No CPAP    Past Surgical History:  Procedure Laterality Date  . DILATION AND CURETTAGE OF UTERUS  1981   abnormal pap  . KNEE ARTHROSCOPY Left 2007   x 2  .  LUMBAR LAMINECTOMY/DECOMPRESSION MICRODISCECTOMY  05/31/2011   Procedure: LUMBAR LAMINECTOMY/DECOMPRESSION MICRODISCECTOMY;  Surgeon: Johnn Hai;  Location: [facility];  Service: Orthopedics;  Laterality: Left;  Decompression Lumbar four to five and  Lumbar five to Sacral one on Left  (X-Ray)  . PARTIAL HYSTERECTOMY  1982  . TONSILLECTOMY     as child  . TOTAL KNEE ARTHROPLASTY Left 12/18/2005    Family History  Problem Relation Age of Onset  . Anesthesia problems Mother   . Heart disease Mother        CHF, atrial fib  . Heart failure Mother   . Anesthesia problems Sister   . Colon polyps Father   . Heart disease Father         "Fluid around the heart"  . Hypertension Sister     Social History   Socioeconomic History  . Marital status: Married    Spouse name: Not on file  . Number of children: 2  . Years of education: 8  . Highest education level: Not on file  Social Needs  . Financial resource strain: Not on file  . Food insecurity - worry: Not on file  . Food insecurity - inability: Not on file  . Transportation needs - medical: Not on file  . Transportation needs - non-medical: Not on file  Occupational History  . Occupation: Disabled  Tobacco Use  . Smoking status: Never Smoker  . Smokeless tobacco: Never Used  Substance and Sexual Activity  . Alcohol use: No  . Drug use: No  . Sexual activity: Yes    Birth control/protection: Post-menopausal  Other Topics Concern  . Not on file  Social History Narrative   Tea daily.  Rarely has caffeine       Review of systems: Review of Systems  Constitutional: Negative for fever and chills.  HENT: Positive for sinus problem  Eyes: Negative for blurred vision.  Respiratory: Negative for cough, shortness of breath and wheezing.   Cardiovascular: Negative for chest pain and palpitations.  Gastrointestinal: as per HPI Genitourinary: Negative for dysuria, urgency, frequency and hematuria.  Musculoskeletal: Positive for myalgias, back pain and joint pain.  Skin: Negative for itching and rash.  Neurological: Negative for dizziness, tremors, focal weakness, seizures and loss of consciousness.  Endo/Heme/Allergies: Positive for seasonal allergies.  Psychiatric/Behavioral: Negative for suicidal ideas and hallucinations. Positive for depression and anxiety All other systems reviewed and are negative.   Physical Exam: Vitals:   06/13/17 0827  BP: 110/60  Pulse: 64   Body mass index is 35.53 kg/m. Gen:      No acute distress HEENT:  EOMI, sclera anicteric Neck:     No masses; no thyromegaly Lungs:    Clear to auscultation bilaterally; normal  respiratory effort CV:         Regular rate and rhythm; no murmurs Abd:      + bowel sounds; soft, non-tender; no palpable masses, no distension Ext:    No edema; adequate peripheral perfusion Skin:      Warm and dry; no rash Neuro: alert and oriented x 3 Psych: normal mood and affect  Data Reviewed:  Reviewed labs, radiology imaging, old records and pertinent past GI work up  CT abdomen and pelvis with out contrast 11/07/12 Findings: A small nodular density at the left lung base on image 7 appears stable.  The lung bases are otherwise clear.  There is no pleural effusion.  As evaluated in the noncontrast state, the liver, spleen, gallbladder and adrenal  glands appear normal.  Low density inferiorly in the uncinate process of the pancreas is stable.  The adrenal glands and kidneys appear normal.  There is a moderate sized hiatal hernia which has mildly enlarged compared with the prior study.  The stomach is decompressed.  There is no small bowel wall thickening or distension.  The colon is well distended with enteric contrast and demonstrates no wall thickening or focal lesion.  The appendix appears normal.  There are no enlarged abdominal pelvic lymph nodes.  The uterus is surgically absent.  Both ovaries appear stable.  No bladder abnormality is seen.  There are stable degenerative changes throughout the thoracolumbar spine with osteophytes and endplate sclerosis.  No acute or worrisome osseous findings are seen.  IMPRESSION:  1.  No acute abdominal findings identified. 2.  Interval enlargement of hiatal hernia, now moderate in size. 3.  Thoracolumbar spondylosis.  HIDA 06/25/08 Patent cystic duct and common bile duct.  Negative for cholecystitis. Normal gallbladder ejection fraction, 83%  EGD 06/17/08 1) Normal stomach in the antrum  2) Normal esophagus  3) Normal duodenum  4) Normal stomach in the fundus  s/p antral biopsies to r/o  H.Pylori  Colonoscopy 06/17/08 1) Normal colon  2) Normal cecum  3) Normal rectum  s/p random colon biopsies to r/o microscopic colitis  1. STOMACH, ANTRUM, BIOPSY: GASTRIC ANTRAL MUCOSA WITH ASSOCIATED MILD CHRONIC INFLAMMATION, NO EVIDENCE OF HELICOBACTER PYLORI, INTESTINAL METAPLASIA, DYSPLASIA, OR MALIGNANCY IDENTIFIED.  2. COLON, RANDOM BIOPSY: BENIGN COLONIC MUCOSA. NO SIGNIFICANT INFLAMMATION OR OTHER ABNORMALITIES IDENTIFIED.  Assessment and Plan/Recommendations:  66 year old female with history of hiatal hernia, GERD here with complaints of worsening epigastric abdominal pain, associated with reflux symptoms and daily vomiting Will schedule upper endoscopy to evaluate possible chronic gastric volvulus in the setting of large hiatal hernia Continue omeprazole 40 mg daily Small frequent meals Advised patient to follow antireflux measures and lifestyle modification  Constipation alternating with diarrhea/IBS Start amities 8 mcg twice daily Increase dietary fiber and fluid intake Benefiber 1 tablespoon 2-3 times daily with meals  The risks and benefits as well as alternatives of endoscopic procedure(s) have been discussed and reviewed. All questions answered. The patient agrees to proceed.   Damaris Hippo , MD [phone] Mon-Fri 8a-5p [phone] after 5p, weekends, holidays  CC: Hassell Done, [name], *

## 2017-06-26 ENCOUNTER — Other Ambulatory Visit: Payer: Self-pay

## 2017-06-26 ENCOUNTER — Encounter: Payer: Self-pay | Admitting: Gastroenterology

## 2017-06-26 ENCOUNTER — Ambulatory Visit (AMBULATORY_SURGERY_CENTER): Payer: BLUE CROSS/BLUE SHIELD | Admitting: Gastroenterology

## 2017-06-26 VITALS — BP 127/58 | HR 52 | Temp 97.5°F | Resp 17 | Ht 64.0 in | Wt 207.0 lb

## 2017-06-26 DIAGNOSIS — R1310 Dysphagia, unspecified: Secondary | ICD-10-CM

## 2017-06-26 DIAGNOSIS — G8929 Other chronic pain: Secondary | ICD-10-CM

## 2017-06-26 DIAGNOSIS — R109 Unspecified abdominal pain: Secondary | ICD-10-CM | POA: Diagnosis not present

## 2017-06-26 DIAGNOSIS — R1013 Epigastric pain: Secondary | ICD-10-CM

## 2017-06-26 MED ORDER — SODIUM CHLORIDE 0.9 % IV SOLN
500.0000 mL | Freq: Once | INTRAVENOUS | Status: DC
Start: 2017-06-26 — End: 2017-06-26

## 2017-06-26 NOTE — Progress Notes (Signed)
Pt's states no medical or surgical changes since previsit or office visit. 

## 2017-06-26 NOTE — Progress Notes (Signed)
Spontaneous respirations throughout. VSS. Resting comfortably. To PACU on room air. Report to  RN. 

## 2017-06-26 NOTE — Patient Instructions (Signed)
YOU HAD AN ENDOSCOPIC PROCEDURE TODAY AT [facility]:   Refer to the procedure report that was given to you for any specific questions about what was found during the examination.  If the procedure report does not answer your questions, please call your gastroenterologist to clarify.  If you requested that your care partner not be given the details of your procedure findings, then the procedure report has been included in a sealed envelope for you to review at your convenience later.  YOU SHOULD EXPECT: Some feelings of bloating in the abdomen. Passage of more gas than usual.  Walking can help get rid of the air that was put into your GI tract during the procedure and reduce the bloating. If you had a lower endoscopy (such as a colonoscopy or flexible sigmoidoscopy) you may notice spotting of blood in your stool or on the toilet paper. If you underwent a bowel prep for your procedure, you may not have a normal bowel movement for a few days.  Please Note:  You might notice some irritation and congestion in your nose or some drainage.  This is from the oxygen used during your procedure.  There is no need for concern and it should clear up in a day or so.  SYMPTOMS TO REPORT IMMEDIATELY:     Following upper endoscopy (EGD)  Vomiting of blood or coffee ground material  New chest pain or pain under the shoulder blades  Painful or persistently difficult swallowing  New shortness of breath  Fever of 100F or higher  Black, tarry-looking stools  For urgent or emergent issues, a gastroenterologist can be reached at any hour by calling [phone].   DIET:  We do recommend a small meal at first, but then you may proceed to your regular diet.  Drink plenty of fluids but you should avoid alcoholic beverages for 24 hours.  ACTIVITY:  You should plan to take it easy for the rest of today and you should NOT DRIVE or use heavy machinery until tomorrow (because of the sedation medicines  used during the test).    FOLLOW UP: Our staff will call the number listed on your records the next business day following your procedure to check on you and address any questions or concerns that you may have regarding the information given to you following your procedure. If we do not reach you, we will leave a message.  However, if you are feeling well and you are not experiencing any problems, there is no need to return our call.  We will assume that you have returned to your regular daily activities without incident.  If any biopsies were taken you will be contacted by phone or by letter within the next 1-3 weeks.  Please call us at [phone] if you have not heard about the biopsies in 3 weeks.    SIGNATURES/CONFIDENTIALITY: You and/or your care partner have signed paperwork which will be entered into your electronic medical record.  These signatures attest to the fact that that the information above on your After Visit Summary has been reviewed and is understood.  Full responsibility of the confidentiality of this discharge information lies with you and/or your care-partner.   Resume medications. Information given on hiatal hernia.

## 2017-06-26 NOTE — Op Note (Signed)
[facility] Patient Name: Doris Smith Procedure Date: 06/26/2017 11:51 AM MRN: [id number] Endoscopist: Mauri Pole , MD Age: 66 Referring MD:  Date of Birth: [birth year] Gender: Female Account #: [id number] Procedure:                Upper GI endoscopy Indications:              Dysphagia, Epigastric abdominal pain Medicines:                Monitored Anesthesia Care Procedure:                Pre-Anesthesia Assessment:                           - Prior to the procedure, a History and Physical                            was performed, and patient medications and                            allergies were reviewed. The patient's tolerance of                            previous anesthesia was also reviewed. The risks                            and benefits of the procedure and the sedation                            options and risks were discussed with the patient.                            All questions were answered, and informed consent                            was obtained. Prior Anticoagulants: The patient has                            taken no previous anticoagulant or antiplatelet                            agents. ASA Grade Assessment: II - A patient with                            mild systemic disease. After reviewing the risks                            and benefits, the patient was deemed in                            satisfactory condition to undergo the procedure.                           After obtaining informed consent, the endoscope was  passed under direct vision. Throughout the                            procedure, the patient's blood pressure, pulse, and                            oxygen saturations were monitored continuously. The                            Endoscope was introduced through the mouth, and                            advanced to the second part of duodenum. The upper                            GI endoscopy  was accomplished without difficulty.                            The patient tolerated the procedure well. Scope In: Scope Out: Findings:                 Esophagogastric landmarks were identified: the                            Z-line was found at 33 cm and the site of hiatal                            narrowing was found at 40 cm from the incisors. No                            evidence of esophageal stricture                           A large hiatal hernia was found. The proximal                            extent of the gastric folds (end of tubular                            esophagus) was 33 cm from the incisors. The hiatal                            narrowing was 40 cm from the incisors. The Z-line                            was 33 cm from the incisors.                           The exam of the stomach was otherwise normal.                           The examined duodenum was normal. Complications:            No immediate complications. Estimated Blood Loss:  Estimated blood loss: none. Impression:               - Esophagogastric landmarks identified.                           - Large hiatal hernia.                           - Normal examined duodenum.                           - No specimens collected. Recommendation:           - Patient has a contact number available for                            emergencies. The signs and symptoms of potential                            delayed complications were discussed with the                            patient. Return to normal activities tomorrow.                            Written discharge instructions were provided to the                            patient.                           - Resume previous diet.                           - Continue present medications.                           - Refer to a surgeon at appointment to be scheduled                            if patient wants to proceed with hiatal hernia                             repair given her symptoms. Mauri Pole, MD 06/26/2017 12:04:03 PM This report has been signed electronically.

## 2017-06-27 ENCOUNTER — Telehealth: Payer: Self-pay | Admitting: *Deleted

## 2017-06-27 NOTE — Telephone Encounter (Signed)
  Follow up Call-  Call back number 06/26/2017  Post procedure Call Back phone  # [phone]  Permission to leave phone message Yes  Some recent data might be hidden     Patient questions:  Do you have a fever, pain , or abdominal swelling? No. Pain Score  0 *  Have you tolerated food without any problems? Yes.    Have you been able to return to your normal activities? Yes.    Do you have any questions about your discharge instructions: Diet  no Medications  No. Follow up visit  No.  Do you have questions or concerns about your Care? No.  Actions: * If pain score is 4 or above: No action needed, pain <4.

## 2017-06-30 DIAGNOSIS — H5711 Ocular pain, right eye: Secondary | ICD-10-CM | POA: Diagnosis not present

## 2017-06-30 DIAGNOSIS — S0591XA Unspecified injury of right eye and orbit, initial encounter: Secondary | ICD-10-CM | POA: Diagnosis not present

## 2017-06-30 DIAGNOSIS — H53141 Visual discomfort, right eye: Secondary | ICD-10-CM | POA: Diagnosis not present

## 2017-06-30 DIAGNOSIS — W228XXA Striking against or struck by other objects, initial encounter: Secondary | ICD-10-CM | POA: Diagnosis not present

## 2017-06-30 DIAGNOSIS — H538 Other visual disturbances: Secondary | ICD-10-CM | POA: Diagnosis not present

## 2017-06-30 DIAGNOSIS — H1131 Conjunctival hemorrhage, right eye: Secondary | ICD-10-CM | POA: Diagnosis not present

## 2017-07-11 DIAGNOSIS — H1131 Conjunctival hemorrhage, right eye: Secondary | ICD-10-CM | POA: Diagnosis not present

## 2017-07-27 ENCOUNTER — Ambulatory Visit: Payer: BLUE CROSS/BLUE SHIELD | Admitting: Family Medicine

## 2017-07-27 ENCOUNTER — Encounter: Payer: Self-pay | Admitting: Family Medicine

## 2017-07-27 VITALS — BP 114/61 | HR 66 | Temp 97.0°F | Ht 64.0 in | Wt 205.0 lb

## 2017-07-27 DIAGNOSIS — J019 Acute sinusitis, unspecified: Secondary | ICD-10-CM

## 2017-07-27 DIAGNOSIS — B9689 Other specified bacterial agents as the cause of diseases classified elsewhere: Secondary | ICD-10-CM

## 2017-07-27 MED ORDER — FLUTICASONE PROPIONATE 50 MCG/ACT NA SUSP: 2.0000 | Freq: Every day | NASAL | 6 refills | Status: DC

## 2017-07-27 MED ORDER — AMOXICILLIN-POT CLAVULANATE 875-125 MG PO TABS: 1.0000 | ORAL_TABLET | Freq: Two times a day (BID) | ORAL | 0 refills | Status: DC

## 2017-07-27 NOTE — Patient Instructions (Addendum)
I value your feedback and appreciate you entrusting [place] with your care.  If you get a survey, I would appreciate your taking the time to let us know what your experience was like.   - Get plenty of rest and drink plenty of fluids. - Try to breathe moist air. Use a cold mist humidifier. - Consume warm fluids (soup or tea) to provide relief for a stuffy nose and to loosen phlegm. - For nasal stuffiness, try saline nasal spray or a Neti Pot. Afrin nasal spray can also be used but this product should not be used longer than 3 days or it will cause rebound nasal stuffiness (worsening nasal congestion). - For sore throat pain relief: suck on throat lozenges, hard candy or popsicles; gargle with warm salt water (1/4 tsp. salt per 8 oz. of water); and eat soft, bland foods. - Eat a well-balanced diet. If you cannot, ensure you are getting enough nutrients by taking a daily multivitamin. - Avoid dairy products, as they can thicken phlegm. - Avoid alcohol, as it impairs your body's immune system.  CONTACT YOUR DOCTOR IF YOU EXPERIENCE ANY OF THE FOLLOWING: - High fever - Ear pain - Sinus-type headache - Unusually severe cold symptoms - Cough that gets worse while other cold symptoms improve - Flare up of any chronic lung problem, such as asthma - Your symptoms persist longer than 2 weeks   Sinusitis, Adult Sinusitis is soreness and inflammation of your sinuses. Sinuses are hollow spaces in the bones around your face. They are located:  Around your eyes.  In the middle of your forehead.  Behind your nose.  In your cheekbones.  Your sinuses and nasal passages are lined with a stringy fluid (mucus). Mucus normally drains out of your sinuses. When your nasal tissues get inflamed or swollen, the mucus can get trapped or blocked so air cannot flow through your sinuses. This lets bacteria, viruses, and funguses grow, and that leads to infection. Follow these instructions at home: Medicines  Take,  use, or apply over-the-counter and prescription medicines only as told by your doctor. These may include nasal sprays.  If you were prescribed an antibiotic medicine, take it as told by your doctor. Do not stop taking the antibiotic even if you start to feel better. Hydrate and Humidify  Drink enough water to keep your pee (urine) clear or pale yellow.  Use a cool mist humidifier to keep the humidity level in your home above 50%.  Breathe in steam for 10-15 minutes, 3-4 times a day or as told by your doctor. You can do this in the bathroom while a hot shower is running.  Try not to spend time in cool or dry air. Rest  Rest as much as possible.  Sleep with your head raised (elevated).  Make sure to get enough sleep each night. General instructions  Put a warm, moist washcloth on your face 3-4 times a day or as told by your doctor. This will help with discomfort.  Wash your hands often with soap and water. If there is no soap and water, use hand sanitizer.  Do not smoke. Avoid being around people who are smoking (secondhand smoke).  Keep all follow-up visits as told by your doctor. This is important. Contact a doctor if:  You have a fever.  Your symptoms get worse.  Your symptoms do not get better within 10 days. Get help right away if:  You have a very bad headache.  You cannot stop throwing  up (vomiting).  You have pain or swelling around your face or eyes.  You have trouble seeing.  You feel confused.  Your neck is stiff.  You have trouble breathing. This information is not intended to replace advice given to you by your health care provider. Make sure you discuss any questions you have with your health care provider. Document Released: 12/13/2007 Document Revised: 02/20/2016 Document Reviewed: 04/21/2015 Elsevier Interactive Patient Education  [employer].

## 2017-07-27 NOTE — Progress Notes (Signed)
Subjective: CC: URI PCP: Chevis Pretty, FNP WUJ:[id number] H Leeman is a 67 y.o. female presenting to clinic today for:  1. Cold symptoms  Patient reports frontal headache, sinus pressure, ear pressure, chills, sinus discharge that is yellow, a mild nonproductive cough, chest congestion and sneezing that started 3 weeks ago.  Denies hemoptysis, dizziness, rash, nausea, vomiting, diarrhea, fevers, myalgia, sick contacts, recent travel.  Patient has used Alka-Seltzer with little relief of symptoms.  No tobacco use/ exposure.  She reports that she had similar symptoms several times per year.  She used Flonase in the past but has not had any recently because she ran out of refills.   ROS: Per HPI  Allergies  Allergen Reactions  . Iohexol     "over 20 years ago" had reaction "hurting in arm and heart"-Done in [place], [place].Marland Kitchenphysician stopped CT at that time.  . Codeine Other (See Comments)    Makes feel like she is wild   . Sulfonamide Derivatives Other (See Comments)    Upset stomach  . Celecoxib Nausea And Vomiting  . Vitamin B12 Rash   Past Medical History:  Diagnosis Date  . A-fib ([place]) 11/2012  . Anxiety    takes Ativan  . Asthma 04/07/2011   dx  . Depression   . Fatty liver   . Fibromyalgia   . Fracture, ankle 10/2008   right and left  . GERD (gastroesophageal reflux disease)   . Headache(784.0)    migraines  . Irritable bowel syndrome   . Mental disorder    dx bipolar  . PONV (postoperative nausea and vomiting)   . Sleep apnea 04/2011    No CPAP    Current Outpatient Medications:  .  aspirin 325 MG tablet, Take 325 mg by mouth every other day. Reported on 08/20/2015, Disp: , Rfl:  .  FLUoxetine (PROZAC) 20 MG capsule, Take 1 capsule by mouth 2 (two) times daily., Disp: , Rfl:  .  gabapentin (NEURONTIN) 300 MG capsule, Take 1 capsule (300 mg total) by mouth 3 (three) times daily., Disp: 90 capsule, Rfl: 5 .  lamoTRIgine (LAMICTAL) 200 MG tablet, Take 100 mg by mouth  2 (two) times daily., Disp: , Rfl:  .  LORazepam (ATIVAN) 0.5 MG tablet, Take 1 tablet (0.5 mg total) by mouth 2 (two) times daily as needed., Disp: 30 tablet, Rfl: 2 .  omeprazole (PRILOSEC) 40 MG capsule, Take 1 capsule (40 mg total) by mouth daily., Disp: 30 capsule, Rfl: 5 Social History   Socioeconomic History  . Marital status: Married    Spouse name: Not on file  . Number of children: 2  . Years of education: 36  . Highest education level: Not on file  Social Needs  . Financial resource strain: Not on file  . Food insecurity - worry: Not on file  . Food insecurity - inability: Not on file  . Transportation needs - medical: Not on file  . Transportation needs - non-medical: Not on file  Occupational History  . Occupation: Disabled  Tobacco Use  . Smoking status: Never Smoker  . Smokeless tobacco: Never Used  Substance and Sexual Activity  . Alcohol use: No  . Drug use: No  . Sexual activity: Yes    Birth control/protection: Post-menopausal  Other Topics Concern  . Not on file  Social History Narrative   Tea daily.  Rarely has caffeine    Family History  Problem Relation Age of Onset  . Anesthesia problems Mother   .  Heart disease Mother        CHF, atrial fib  . Heart failure Mother   . Anesthesia problems Sister   . Colon polyps Father   . Heart disease Father        "Fluid around the heart"  . Hypertension Sister     Objective: Office vital signs reviewed. BP 114/61   Pulse 66   Temp (!) 97 F (36.1 C) (Oral)   Ht 5\' 4"  (1.626 m)   Wt 205 lb (93 kg)   BMI 35.19 kg/m   Physical Examination:  General: Awake, alert, well nourished, nontoxic appearing, No acute distress HEENT: + frontal and maxillary sinus TTP    Neck: No masses palpated. No lymphadenopathy    Ears: Tympanic membranes intact, normal light reflex, no erythema, no bulging    Eyes: PERRLA, extraocular membranes intact, sclera white, no ocular discharge    Nose: nasal turbinates moist,  slightly opaque nasal discharge    Throat: moist mucus membranes, no erythema, tonsils surgically absent.  Airway is patent Cardio: regular rate and rhythm, S1S2 heard, no murmurs appreciated Pulm: No wheezes. normal work of breathing on room air Psych: Speech somewhat pressured.  Patient interrupts frequently.  Assessment/ Plan: 67 y.o. female   1. Acute bacterial sinusitis Patient is nontoxic-appearing.  She is afebrile with normal vital signs.  Given duration of symptoms, will empirically treat as acute bacterial sinusitis.  Augmentin prescribed p.o. twice daily.  Refill Flonase.  Home care instructions were reviewed with the patient and a handout was provided.  Follow-up with PCP as needed.  Meds ordered this encounter  Medications  . amoxicillin-clavulanate (AUGMENTIN) 875-125 MG tablet    Sig: Take 1 tablet by mouth 2 (two) times daily.    Dispense:  20 tablet    Refill:  0  . fluticasone (FLONASE) 50 MCG/ACT nasal spray    Sig: Place 2 sprays into both nostrils daily.    Dispense:  16 g    Refill:  [place], [place] [phone]

## 2017-08-10 ENCOUNTER — Encounter: Payer: Self-pay | Admitting: Gastroenterology

## 2017-08-10 ENCOUNTER — Ambulatory Visit (INDEPENDENT_AMBULATORY_CARE_PROVIDER_SITE_OTHER): Payer: BLUE CROSS/BLUE SHIELD | Admitting: Gastroenterology

## 2017-08-10 VITALS — BP 126/84 | HR 72 | Ht 65.0 in | Wt 206.2 lb

## 2017-08-10 DIAGNOSIS — R1319 Other dysphagia: Secondary | ICD-10-CM

## 2017-08-10 DIAGNOSIS — K219 Gastro-esophageal reflux disease without esophagitis: Secondary | ICD-10-CM

## 2017-08-10 DIAGNOSIS — R1013 Epigastric pain: Secondary | ICD-10-CM

## 2017-08-10 DIAGNOSIS — K449 Diaphragmatic hernia without obstruction or gangrene: Secondary | ICD-10-CM | POA: Diagnosis not present

## 2017-08-10 MED ORDER — ESOMEPRAZOLE MAGNESIUM 40 MG PO CPDR: 40.0000 mg | DELAYED_RELEASE_CAPSULE | Freq: Every day | ORAL | 1 refills | Status: DC

## 2017-08-10 MED ORDER — ESOMEPRAZOLE MAGNESIUM 40 MG PO CPDR: DELAYED_RELEASE_CAPSULE | ORAL | 1 refills | Status: DC

## 2017-08-10 NOTE — Patient Instructions (Addendum)
You have been scheduled for an abdominal ultrasound at [facility] (1st floor of hospital) on 08/14/2017 at 10:30am. Please arrive 15 minutes prior to your appointment for registration. Make certain not to have anything to eat or drink after midnight prior to your appointment. Should you need to reschedule your appointment, please contact radiology at [phone]. This test typically takes about 30 minutes to perform.  We have sent in Nexium to your pharmacy   Stay on your Nexium for your procedure   You have been scheduled for an esophageal manometry at [facility] on 08/27/2017 at 8:30am. Please arrive 30 minutes prior to your procedure for registration. You will need to go to outpatient registration (1st floor of the hospital) first. Make certain to bring your insurance cards as well as a complete list of medications.  Please remember the following:  1) Do not take any muscle relaxants, xanax (alprazolam) or ativan for 1 day prior to your     test as well as the day of the test.  2) Nothing to eat or drink after 12:00 midnight on the night before your test.  3) Hold all diabetic medications/insulin the morning of the test. You may eat and take             your medications after the test.  It will take at least 2 weeks to receive the results of this test from your physician. ------------------------------------------ ABOUT ESOPHAGEAL MANOMETRY Esophageal manometry (muh-NOM-uh-tree) is a test that gauges how well your esophagus works. Your esophagus is the long, muscular tube that connects your throat to your stomach. Esophageal manometry measures the rhythmic muscle contractions (peristalsis) that occur in your esophagus when you swallow. Esophageal manometry also measures the coordination and force exerted by the muscles of your esophagus.  During esophageal manometry, a thin, flexible tube (catheter) that contains sensors is passed through your nose, down your esophagus and  into your stomach. Esophageal manometry can be helpful in diagnosing some mostly uncommon disorders that affect your esophagus.  Why it's done Esophageal manometry is used to evaluate the movement (motility) of food through the esophagus and into the stomach. The test measures how well the circular bands of muscle (sphincters) at the top and bottom of your esophagus open and close, as well as the pressure, strength and pattern of the wave of esophageal muscle contractions that moves food along.  What you can expect Esophageal manometry is an outpatient procedure done without sedation. Most people tolerate it well. You may be asked to change into a hospital gown before the test starts.  During esophageal manometry  While you are sitting up, a member of your health care team sprays your throat with a numbing medication or puts numbing gel in your nose or both.  A catheter is guided through your nose into your esophagus. The catheter may be sheathed in a water-filled sleeve. It doesn't interfere with your breathing. However, your eyes may water, and you may gag. You may have a slight nosebleed from irritation.  After the catheter is in place, you may be asked to lie on your back on an exam table, or you may be asked to remain seated.  You then swallow small sips of water. As you do, a computer connected to the catheter records the pressure, strength and pattern of your esophageal muscle contractions.  During the test, you'll be asked to breathe slowly and smoothly, remain as still as possible, and swallow only when you're asked to do  so.  A member of your health care team may move the catheter down into your stomach while the catheter continues its measurements.  The catheter then is slowly withdrawn. The test usually lasts 20 to 30 minutes.  After esophageal manometry  When your esophageal manometry is complete, you may return to your normal activities  This test typically takes 30-45 minutes to  complete.  Follow up in 3 months  ________________________________________________________________________________

## 2017-08-10 NOTE — Progress Notes (Signed)
Doris Smith    [id number]    1950-12-08  Primary Care Physician:Martin, [name], FNP  Referring Physician: Chevis Pretty, [address] [place], [place] [address]  Chief complaint:  Epigastric pain  HPI: 33 yr F here for follow up visit with c/o persistent intermittent abdominal pain.  EGD June 26, 2017 showed hiatal hernia otherwise unremarkable exam. She has epigastric abdominal pain on and off almost daily basis, no relationship to diet or activity.  She sometimes feels it comes on around dinnertime.  Omeprazole helps to some extent.  She takes Tums with no significant relief.  She feels it spontaneously resolves 1-2 hours later.  She has intermittent dysphagia with liquids and solids. Colonoscopy in December 2009 was normal, random biopsies negative for microscopic colitis Denies any nausea or vomiting.  No change in bowel habits melena or blood per rectum.     Outpatient Encounter Medications as of 08/10/2017  Medication Sig  . amoxicillin-clavulanate (AUGMENTIN) 875-125 MG tablet Take 1 tablet by mouth 2 (two) times daily.  Marland Kitchen aspirin 325 MG tablet Take 325 mg by mouth every other day. Reported on 08/20/2015  . FLUoxetine (PROZAC) 20 MG capsule Take 1 capsule by mouth 2 (two) times daily.  . fluticasone (FLONASE) 50 MCG/ACT nasal spray Place 2 sprays into both nostrils daily.  Marland Kitchen gabapentin (NEURONTIN) 300 MG capsule Take 1 capsule (300 mg total) by mouth 3 (three) times daily.  Marland Kitchen lamoTRIgine (LAMICTAL) 200 MG tablet Take 100 mg by mouth 2 (two) times daily.  Marland Kitchen LORazepam (ATIVAN) 0.5 MG tablet Take 1 tablet (0.5 mg total) by mouth 2 (two) times daily as needed.  Marland Kitchen omeprazole (PRILOSEC) 40 MG capsule Take 1 capsule (40 mg total) by mouth daily.   No facility-administered encounter medications on file as of 08/10/2017.     Allergies as of 08/10/2017 - Review Complete 08/10/2017  Allergen Reaction Noted  . Iohexol  11/06/2012  . Codeine Other  (See Comments) 05/14/2008  . Sulfonamide derivatives Other (See Comments) 05/14/2008  . Celecoxib Nausea And Vomiting 12/10/2015  . Vitamin b12 Rash 11/05/2012    Past Medical History:  Diagnosis Date  . A-fib ([place]) 11/2012  . Anxiety    takes Ativan  . Asthma 04/07/2011   dx  . Depression   . Fatty liver   . Fibromyalgia   . Fracture, ankle 10/2008   right and left  . GERD (gastroesophageal reflux disease)   . Headache(784.0)    migraines  . Irritable bowel syndrome   . Mental disorder    dx bipolar  . PONV (postoperative nausea and vomiting)   . Sleep apnea 04/2011    No CPAP    Past Surgical History:  Procedure Laterality Date  . DILATION AND CURETTAGE OF UTERUS  1981   abnormal pap  . KNEE ARTHROSCOPY Left 2007   x 2  . LUMBAR LAMINECTOMY/DECOMPRESSION MICRODISCECTOMY  05/31/2011   Procedure: LUMBAR LAMINECTOMY/DECOMPRESSION MICRODISCECTOMY;  Surgeon: Johnn Hai;  Location: [facility];  Service: Orthopedics;  Laterality: Left;  Decompression Lumbar four to five and  Lumbar five to Sacral one on Left  (X-Ray)  . PARTIAL HYSTERECTOMY  1982  . TONSILLECTOMY     as child  . TOTAL KNEE ARTHROPLASTY Left 12/18/2005    Family History  Problem Relation Age of Onset  . Anesthesia problems Mother   . Heart disease Mother        CHF, atrial fib  .  Heart failure Mother   . Anesthesia problems Sister   . Colon polyps Father   . Heart disease Father        "Fluid around the heart"  . Hypertension Sister     Social History   Socioeconomic History  . Marital status: Married    Spouse name: Not on file  . Number of children: 2  . Years of education: 5  . Highest education level: Not on file  Social Needs  . Financial resource strain: Not on file  . Food insecurity - worry: Not on file  . Food insecurity - inability: Not on file  . Transportation needs - medical: Not on file  . Transportation needs - non-medical: Not on file  Occupational History  .  Occupation: Disabled  Tobacco Use  . Smoking status: Never Smoker  . Smokeless tobacco: Never Used  Substance and Sexual Activity  . Alcohol use: No  . Drug use: No  . Sexual activity: Yes    Birth control/protection: Post-menopausal  Other Topics Concern  . Not on file  Social History Narrative   Tea daily.  Rarely has caffeine       Review of systems: Review of Systems  Constitutional: Negative for fever and chills. Positive for lack of energy HENT: Positive for sinus problems  Eyes: Negative for blurred vision.  Respiratory: Negative for cough, shortness of breath and wheezing.   Cardiovascular: Negative for chest pain and palpitations.  Gastrointestinal: as per HPI Genitourinary: Negative for dysuria, urgency, frequency and hematuria.  Musculoskeletal: Positive for myalgias, back pain and joint pain.  Skin: Negative for itching and rash.  Neurological: Negative for dizziness, tremors, focal weakness, seizures and loss of consciousness.  Endo/Heme/Allergies: Positive for seasonal allergies.  Psychiatric/Behavioral: Negative for  suicidal ideas and hallucinations. Positive for depression and anxiety All other systems reviewed and are negative.   Physical Exam: Vitals:   08/10/17 0902  BP: 126/84  Pulse: 72   Body mass index is 34.32 kg/m. Gen:      No acute distress HEENT:  EOMI, sclera anicteric Neck:     No masses; no thyromegaly Lungs:    Clear to auscultation bilaterally; normal respiratory effort CV:         Regular rate and rhythm; no murmurs Abd:      + bowel sounds; soft, non-tender; no palpable masses, no distension Ext:    No edema; adequate peripheral perfusion Skin:      Warm and dry; no rash Neuro: alert and oriented x 3 Psych: normal mood and affect  Data Reviewed:  Reviewed labs, radiology imaging, old records and pertinent past GI work up   Assessment and Plan/Recommendations:  67 year old female here with complaints of persistent  epigastric abdominal pain, dysphagia and GERD Her symptoms could be related to the large hiatal hernia Will schedule for esophageal manometry to exclude any major motility disorder prior to considering repair of hiatal hernia Obtain abdominal ultrasound to evaluate gallbladder and exclude gallbladder disease We will switch to Nexium 40 mg daily Discussed antireflux measures and lifestyle modification in detail  Due for colonoscopy for colorectal cancer screening in December 2019  25 minutes was spent face-to-face with the patient. Greater than 50% of the time used for counseling as well as treatment plan and follow-up. She had multiple questions which were answered to her satisfaction  K. Denzil Magnuson , MD [phone] Mon-Fri 8a-5p [phone] after 5p, weekends, holidays  CC: Hassell Done, [name], *

## 2017-08-14 ENCOUNTER — Ambulatory Visit (HOSPITAL_COMMUNITY): Payer: BLUE CROSS/BLUE SHIELD

## 2017-08-14 DIAGNOSIS — F4310 Post-traumatic stress disorder, unspecified: Secondary | ICD-10-CM | POA: Diagnosis not present

## 2017-08-14 DIAGNOSIS — F410 Panic disorder [episodic paroxysmal anxiety] without agoraphobia: Secondary | ICD-10-CM | POA: Diagnosis not present

## 2017-08-14 DIAGNOSIS — F312 Bipolar disorder, current episode manic severe with psychotic features: Secondary | ICD-10-CM | POA: Diagnosis not present

## 2017-08-16 ENCOUNTER — Ambulatory Visit (HOSPITAL_COMMUNITY): Payer: BLUE CROSS/BLUE SHIELD

## 2017-08-20 ENCOUNTER — Telehealth: Payer: Self-pay | Admitting: Gastroenterology

## 2017-08-20 NOTE — Telephone Encounter (Signed)
No answer

## 2017-08-22 NOTE — Telephone Encounter (Signed)
Spoke with the patient. Advised her all her old records from seeing Dr Olevia Perches are available to Dr Silverio Decamp. She thanks me for the call. Asks me to tell Dr Silverio Decamp "God put her in my life."

## 2017-08-27 ENCOUNTER — Ambulatory Visit (HOSPITAL_COMMUNITY)
Admission: RE | Admit: 2017-08-27 | Payer: BLUE CROSS/BLUE SHIELD | Source: Ambulatory Visit | Admitting: Gastroenterology

## 2017-08-27 ENCOUNTER — Encounter (HOSPITAL_COMMUNITY): Admission: RE | Payer: Self-pay | Source: Ambulatory Visit

## 2017-08-27 SURGERY — MANOMETRY, ESOPHAGUS

## 2017-08-27 NOTE — Progress Notes (Signed)
Pt did not show for esophageal manometry today. Attempted to call to talk with pt. Unable to leave message as no voice mail set up. Message sent to dr. Silverio Decamp.

## 2017-08-28 NOTE — Progress Notes (Signed)
Beth, can you please check? Thanks

## 2017-08-28 NOTE — Progress Notes (Signed)
ok 

## 2017-08-28 NOTE — Progress Notes (Signed)
I spoke to the patient. She does not mention being no show no call to her appointment for the esophageal manometry. States she appreciates the call as she has been sick. She states she continues taking Nexium. Also scheduled herself a follow up appointment.

## 2017-09-14 ENCOUNTER — Ambulatory Visit (HOSPITAL_COMMUNITY): Payer: BLUE CROSS/BLUE SHIELD

## 2017-10-09 DIAGNOSIS — F312 Bipolar disorder, current episode manic severe with psychotic features: Secondary | ICD-10-CM | POA: Diagnosis not present

## 2017-10-09 DIAGNOSIS — F4310 Post-traumatic stress disorder, unspecified: Secondary | ICD-10-CM | POA: Diagnosis not present

## 2017-10-09 DIAGNOSIS — F410 Panic disorder [episodic paroxysmal anxiety] without agoraphobia: Secondary | ICD-10-CM | POA: Diagnosis not present

## 2017-10-10 ENCOUNTER — Ambulatory Visit (HOSPITAL_COMMUNITY): Payer: BLUE CROSS/BLUE SHIELD | Attending: Gastroenterology

## 2017-11-02 DIAGNOSIS — J4550 Severe persistent asthma, uncomplicated: Secondary | ICD-10-CM | POA: Diagnosis not present

## 2017-11-02 DIAGNOSIS — J309 Allergic rhinitis, unspecified: Secondary | ICD-10-CM | POA: Diagnosis not present

## 2017-11-02 DIAGNOSIS — K449 Diaphragmatic hernia without obstruction or gangrene: Secondary | ICD-10-CM | POA: Diagnosis not present

## 2017-11-07 DIAGNOSIS — R109 Unspecified abdominal pain: Secondary | ICD-10-CM | POA: Diagnosis not present

## 2017-11-07 DIAGNOSIS — R350 Frequency of micturition: Secondary | ICD-10-CM | POA: Diagnosis not present

## 2017-11-07 DIAGNOSIS — G8929 Other chronic pain: Secondary | ICD-10-CM | POA: Diagnosis not present

## 2017-11-08 ENCOUNTER — Ambulatory Visit: Payer: BLUE CROSS/BLUE SHIELD | Admitting: Gastroenterology

## 2017-11-13 DIAGNOSIS — F4310 Post-traumatic stress disorder, unspecified: Secondary | ICD-10-CM | POA: Diagnosis not present

## 2017-11-13 DIAGNOSIS — F410 Panic disorder [episodic paroxysmal anxiety] without agoraphobia: Secondary | ICD-10-CM | POA: Diagnosis not present

## 2017-11-13 DIAGNOSIS — F312 Bipolar disorder, current episode manic severe with psychotic features: Secondary | ICD-10-CM | POA: Diagnosis not present

## 2017-11-16 ENCOUNTER — Encounter: Payer: Self-pay | Admitting: Nurse Practitioner

## 2017-11-16 ENCOUNTER — Ambulatory Visit: Payer: BLUE CROSS/BLUE SHIELD | Admitting: Nurse Practitioner

## 2017-11-16 VITALS — BP 127/66 | HR 54 | Temp 97.1°F | Ht 65.0 in | Wt 204.6 lb

## 2017-11-16 DIAGNOSIS — M542 Cervicalgia: Secondary | ICD-10-CM

## 2017-11-16 MED ORDER — CYCLOBENZAPRINE HCL 10 MG PO TABS: 10.0000 mg | ORAL_TABLET | Freq: Three times a day (TID) | ORAL | 1 refills | Status: DC | PRN

## 2017-11-16 MED ORDER — PREDNISONE 20 MG PO TABS: ORAL_TABLET | ORAL | 0 refills | Status: DC

## 2017-11-16 NOTE — Patient Instructions (Signed)

## 2017-11-16 NOTE — Progress Notes (Signed)
   Subjective:    Patient ID: Doris Smith, female    DOB: Apr 27, 1951, 67 y.o.   MRN: [id number]   Chief Complaint: head and neck pain   HPI patient comes in today c/o pain along base of skull and radiates down into neck. Started about 1 week ago. Rates pain 8/10 currently. Nothing seem s to make it worse. Stretching makes it better for a few minutes. No numbness and tingling of upper ext. Sh ehas been using heating pad which has not helped.    Review of Systems  Constitutional: Negative.   HENT: Negative.   Respiratory: Negative.   Cardiovascular: Negative.   Gastrointestinal: Negative.   Genitourinary: Negative.   Musculoskeletal: Positive for neck pain.  Neurological: Negative.  Negative for dizziness, numbness and headaches.  Psychiatric/Behavioral: Negative.   All other systems reviewed and are negative.      Objective:   Physical Exam  Constitutional: She is oriented to person, place, and time. She appears well-developed and well-nourished. She appears distressed (mild).  Cardiovascular: Normal rate.  Pulmonary/Chest: Effort normal.  Musculoskeletal:  FROM of lumbar spine with pain on movement of neck in any direction. Grips equal bil motoir strength and sensation bil uper ext intact.  Neurological: She is alert and oriented to person, place, and time.  Skin: Skin is warm.  Psychiatric: She has a normal mood and affect. Her behavior is normal. Judgment and thought content normal.    BP 127/66 (BP Location: Right Wrist, Cuff Size: Normal)   Pulse (!) 54   Temp (!) 97.1 F (36.2 C) (Oral)   Ht 5\' 5"  (1.651 m)   Wt 204 lb 9.6 oz (92.8 kg)   BMI 34.05 kg/m        Assessment & Plan:  Doris Smith in today with chief complaint of head and neck pain   1. Neck pain Continue moist heat Stretching exercises RTO if no better in 2-3 days - cyclobenzaprine (FLEXERIL) 10 MG tablet; Take 1 tablet (10 mg total) by mouth 3 (three) times daily as needed for muscle  spasms.  Dispense: 30 tablet; Refill: 1 - predniSONE (DELTASONE) 20 MG tablet; 2 po at sametime daily for 5 days  Dispense: 10 tablet; Refill: 0  [name] Done, FNP

## 2017-12-04 ENCOUNTER — Encounter: Payer: Self-pay | Admitting: Family Medicine

## 2017-12-04 ENCOUNTER — Ambulatory Visit (INDEPENDENT_AMBULATORY_CARE_PROVIDER_SITE_OTHER): Payer: BLUE CROSS/BLUE SHIELD

## 2017-12-04 ENCOUNTER — Ambulatory Visit: Payer: BLUE CROSS/BLUE SHIELD | Admitting: Family Medicine

## 2017-12-04 VITALS — BP 102/55 | HR 61 | Temp 96.9°F | Ht 65.0 in | Wt 206.1 lb

## 2017-12-04 DIAGNOSIS — M542 Cervicalgia: Secondary | ICD-10-CM

## 2017-12-04 DIAGNOSIS — W19XXXA Unspecified fall, initial encounter: Secondary | ICD-10-CM | POA: Diagnosis not present

## 2017-12-04 DIAGNOSIS — S6992XA Unspecified injury of left wrist, hand and finger(s), initial encounter: Secondary | ICD-10-CM | POA: Diagnosis not present

## 2017-12-04 DIAGNOSIS — R42 Dizziness and giddiness: Secondary | ICD-10-CM

## 2017-12-04 DIAGNOSIS — R296 Repeated falls: Secondary | ICD-10-CM | POA: Diagnosis not present

## 2017-12-04 DIAGNOSIS — Y92009 Unspecified place in unspecified non-institutional (private) residence as the place of occurrence of the external cause: Principal | ICD-10-CM

## 2017-12-04 DIAGNOSIS — M25532 Pain in left wrist: Secondary | ICD-10-CM

## 2017-12-04 MED ORDER — BETAMETHASONE SOD PHOS & ACET 6 (3-3) MG/ML IJ SUSP
6.0000 mg | Freq: Once | INTRAMUSCULAR | Status: AC
  Administered 2017-12-04: 6 mg via INTRAMUSCULAR

## 2017-12-04 MED ORDER — NABUMETONE 500 MG PO TABS: 500.0000 mg | ORAL_TABLET | Freq: Two times a day (BID) | ORAL | 0 refills | Status: DC

## 2017-12-04 NOTE — Progress Notes (Signed)
Subjective:  Patient ID: Doris Smith, female    DOB: 23-Feb-1951  Age: 67 y.o. MRN: [id number]  CC: Fall (pt here today c/o right hand pain after falling in the bathroom last night)   HPI Doris Smith presents for pain in the left wrist.  She describes it as a 10 out of 10.  It started after she fell forward last night and caught herself with the hand.  She also lifted on the left knee very hard.  She has had a knee replacement with regard to the knee she is to walk without discomfort today.  However with regard to the knee and she is unable to grip she cannot improve her steering wheel to drive today she was able to drive only by gripping with her right hand she is right-hand dominant.  She is able to pull up her jeans only because it is essential but it is very painful.  She was awake all night with her pain.  Patient states that she is also continuing to have pain in the back of her neck.  The pain extends from the occiput to the shoulders without radiation.  It is chronic.  He was treated recently with a dose of prednisone and that was ineffective and also was not well tolerated it made her very irritable.  She says that when she rotates her neck she feels a crackling like glass in the back of her neck.  She cannot fully flex her neck to either side due to the discomfort in her neck.  Additionally the patient tells me she falls frequently.  Last night her fall was brought on by leaning forward to pick something up and she just fell on over.  She has very poor balance.  She would like to see ENT for the possibility of having vertigo.  She says that when she walks she feels like she is falling forward.  Does not have a room spinning sensation.  She is seeing a specialist who has done everything they can to test her and suggested that she see ENT for inner ear work-up.  She just feels off balance all the time.  She is concerned that she is fractured her wrist due to balance problems.  Depression  screen [facility] 2/9 11/16/2017 07/27/2017 10/13/2016  Decreased Interest 3 3 2   Down, Depressed, Hopeless 3 3 2   PHQ - 2 Score 6 6 4   Altered sleeping 3 3 2   Tired, decreased energy 3 3 3   Change in appetite 3 3 0  Feeling bad or failure about yourself  0 0 0  Trouble concentrating 3 3 0  Moving slowly or fidgety/restless 3 3 3   Suicidal thoughts 0 0 0  PHQ-9 Score 21 21 12   Difficult doing work/chores - Somewhat difficult -  Some recent data might be hidden    History Doris Smith has a past medical history of A-fib ([place]) (11/2012), Anxiety, Asthma (04/07/2011), Depression, Fatty liver, Fibromyalgia, Fracture, ankle (10/2008), GERD (gastroesophageal reflux disease), Headache(784.0), Irritable bowel syndrome, Mental disorder, PONV (postoperative nausea and vomiting), and Sleep apnea (04/2011 ).   She has a past surgical history that includes Tonsillectomy; Partial hysterectomy (1982); Dilation and curettage of uterus (1981); Knee arthroscopy (Left, 2007); Total knee arthroplasty (Left, 12/18/2005); and Lumbar laminectomy/decompression microdiscectomy (05/31/2011).   Her family history includes Anesthesia problems in her mother and sister; Colon polyps in her father; Heart disease in her father and mother; Heart failure in her mother; Hypertension in her sister.She reports  that she has never smoked. She has never used smokeless tobacco. She reports that she does not drink alcohol or use drugs.    ROS Review of Systems  Constitutional: Negative.   HENT: Negative.   Eyes: Negative for visual disturbance.  Respiratory: Negative for shortness of breath.   Cardiovascular: Negative for chest pain.  Gastrointestinal: Negative for abdominal pain.  Musculoskeletal: Positive for arthralgias, myalgias and neck pain.  Skin: Negative for wound.  Neurological: Positive for dizziness. Negative for weakness.    Objective:  BP (!) 102/55   Pulse 61   Temp (!) 96.9 F (36.1 C) (Oral)   Ht 5\' 5"  (1.651 m)    Wt 206 lb 2 oz (93.5 kg)   BMI 34.30 kg/m   BP Readings from Last 3 Encounters:  12/04/17 (!) 102/55  11/16/17 127/66  08/10/17 126/84    Wt Readings from Last 3 Encounters:  12/04/17 206 lb 2 oz (93.5 kg)  11/16/17 204 lb 9.6 oz (92.8 kg)  08/10/17 206 lb 4 oz (93.6 kg)     Physical Exam  Constitutional: She is oriented to person, place, and time. She appears well-developed and well-nourished. No distress.  Cardiovascular: Normal rate and regular rhythm.  Pulmonary/Chest: Breath sounds normal.  Musculoskeletal: She exhibits tenderness (t this was noted primarily at the left wrist for all range of motion and for palpation at the ventral surface of the wrist near the radiocarpal junction..  No edema noted.  Full range of motion with minimal guarding and moderate tenderness.). She exhibits no edema or deformity.  There is crepitus noted at the posterior neck on range of motion.  There is near full range of motion with perhaps limitations for end of range flexion and abduction.  There is mild tenderness to palpation of the cervical spine Alice bilaterally.  Both upper extremities have full range of motion trapezius group bilaterally.  Neurological: She is alert and oriented to person, place, and time. She exhibits normal muscle tone.  Skin: Skin is warm and dry.  Psychiatric: She has a normal mood and affect.      Assessment & Plan:   Doris Smith was seen today for fall.  Diagnoses and all orders for this visit:  Fall at home, initial encounter -     DG Wrist Complete Left; Future  Neck pain -     DG Cervical Spine Complete; Future -     betamethasone acetate-betamethasone sodium phosphate (CELESTONE) injection 6 mg -     nabumetone (RELAFEN) 500 MG tablet; Take 1 tablet (500 mg total) by mouth 2 (two) times daily.  Vertigo -     Ambulatory referral to ENT  Frequent falls -     Ambulatory referral to Physical Therapy  Left wrist pain -     DG Wrist Complete Left; Future -      nabumetone (RELAFEN) 500 MG tablet; Take 1 tablet (500 mg total) by mouth 2 (two) times daily.       I have discontinued Doris Smith's predniSONE. I am also having her start on nabumetone. Additionally, I am having her maintain her LORazepam, gabapentin, FLUoxetine, lamoTRIgine, fluticasone, esomeprazole, and cyclobenzaprine. We administered betamethasone acetate-betamethasone sodium phosphate.  Allergies as of 12/04/2017      Reactions   Iohexol    "over 20 years ago" had reaction "hurting in arm and heart"-Done in [place], [place].Marland Kitchenphysician stopped CT at that time.   Codeine Other (See Comments)   Makes feel like she is wild  Sulfonamide Derivatives Other (See Comments)   Upset stomach   Celecoxib Nausea And Vomiting   Sulfa Antibiotics Nausea And Vomiting   Vitamin B12 Rash      Medication List        Accurate as of 12/04/17 11:29 AM. Always use your most recent med list.          cyclobenzaprine 10 MG tablet Commonly known as:  FLEXERIL Take 1 tablet (10 mg total) by mouth 3 (three) times daily as needed for muscle spasms.   esomeprazole 40 MG capsule Commonly known as:  NEXIUM One daily 30 minutes before breakfast   FLUoxetine 20 MG capsule Commonly known as:  PROZAC Take 1 capsule by mouth 2 (two) times daily.   fluticasone 50 MCG/ACT nasal spray Commonly known as:  FLONASE Place 2 sprays into both nostrils daily.   gabapentin 300 MG capsule Commonly known as:  NEURONTIN Take 1 capsule (300 mg total) by mouth 3 (three) times daily.   lamoTRIgine 200 MG tablet Commonly known as:  LAMICTAL Take 100 mg by mouth 2 (two) times daily.   LORazepam 0.5 MG tablet Commonly known as:  ATIVAN Take 1 tablet (0.5 mg total) by mouth 2 (two) times daily as needed.   nabumetone 500 MG tablet Commonly known as:  RELAFEN Take 1 tablet (500 mg total) by mouth 2 (two) times daily.        Follow-up: Return in about 2 weeks (around 12/18/2017).  Claretta Fraise, M.D.

## 2017-12-18 ENCOUNTER — Ambulatory Visit (INDEPENDENT_AMBULATORY_CARE_PROVIDER_SITE_OTHER): Payer: BLUE CROSS/BLUE SHIELD | Admitting: Family Medicine

## 2017-12-18 ENCOUNTER — Encounter: Payer: Self-pay | Admitting: Family Medicine

## 2017-12-18 VITALS — BP 105/57 | HR 57 | Temp 97.1°F | Ht 65.0 in | Wt 205.5 lb

## 2017-12-18 DIAGNOSIS — F410 Panic disorder [episodic paroxysmal anxiety] without agoraphobia: Secondary | ICD-10-CM | POA: Diagnosis not present

## 2017-12-18 DIAGNOSIS — M25532 Pain in left wrist: Secondary | ICD-10-CM

## 2017-12-18 DIAGNOSIS — F312 Bipolar disorder, current episode manic severe with psychotic features: Secondary | ICD-10-CM | POA: Diagnosis not present

## 2017-12-18 DIAGNOSIS — F3175 Bipolar disorder, in partial remission, most recent episode depressed: Secondary | ICD-10-CM

## 2017-12-18 DIAGNOSIS — R2689 Other abnormalities of gait and mobility: Secondary | ICD-10-CM

## 2017-12-18 DIAGNOSIS — F4310 Post-traumatic stress disorder, unspecified: Secondary | ICD-10-CM | POA: Diagnosis not present

## 2017-12-18 NOTE — Progress Notes (Signed)
Subjective:  Patient ID: Doris Smith, female    DOB: 1951-02-12  Age: 67 y.o. MRN: [id number]  CC: Wrist Pain (pt here today for 2 week recheck of her left wrist )   HPI Doris Smith presents for patient tells me she has nearly fallen twice since she was here.  She is concerned that if she falls again it is going to really mess up her wrist.  She is still having pain and points to the hyperthenar eminence region near the medial point.  She says she has not heard from the physical therapist or the ENT.  Checking through the chart referral shows that they have tried to call her but nothing has been scheduled.  Pain remains 8/10.  It radiates up the ventral arm to the elbow.  It is relieved somewhat by wearing the brace. Patient's concern about balance is that she feels off balance quite a bit.  She uses a cane but it is worn out.  She admits she needs to get a new quad cane because this morning and stand up on its own anymore.  She does not want to become dependent on a walker.  She does have one that she is used in the past. She is going to see her doctor who regulates her medications later today, Dr. Constance Haw.  She will talk to her about medications regarding the balance issues.  Additionally the PHQ score noted below should be discussed with her psychiatrist as well. Depression screen [facility] 2/9 12/18/2017 11/16/2017 07/27/2017  Decreased Interest 2 3 3   Down, Depressed, Hopeless 3 3 3   PHQ - 2 Score 5 6 6   Altered sleeping 3 3 3   Tired, decreased energy 3 3 3   Change in appetite 3 3 3   Feeling bad or failure about yourself  0 0 0  Trouble concentrating 3 3 3   Moving slowly or fidgety/restless 3 3 3   Suicidal thoughts 0 0 0  PHQ-9 Score 20 21 21   Difficult doing work/chores - - Somewhat difficult  Some recent data might be hidden    History Doris Smith has a past medical history of A-fib ([place]) (11/2012), Anxiety, Asthma (04/07/2011), Depression, Fatty liver, Fibromyalgia, Fracture, ankle  (10/2008), GERD (gastroesophageal reflux disease), Headache(784.0), Irritable bowel syndrome, Mental disorder, PONV (postoperative nausea and vomiting), and Sleep apnea (04/2011 ).   She has a past surgical history that includes Tonsillectomy; Partial hysterectomy (1982); Dilation and curettage of uterus (1981); Knee arthroscopy (Left, 2007); Total knee arthroplasty (Left, 12/18/2005); and Lumbar laminectomy/decompression microdiscectomy (05/31/2011).   Her family history includes Anesthesia problems in her mother and sister; Colon polyps in her father; Heart disease in her father and mother; Heart failure in her mother; Hypertension in her sister.She reports that she has never smoked. She has never used smokeless tobacco. She reports that she does not drink alcohol or use drugs.    ROS Review of Systems  Constitutional: Negative.  Negative for activity change.  HENT: Negative.   Eyes: Negative for visual disturbance.  Respiratory: Negative for shortness of breath.   Cardiovascular: Negative for chest pain.  Gastrointestinal: Negative for abdominal pain.  Musculoskeletal: Positive for arthralgias (Left wrist) and myalgias (Left wrist).  Neurological: Positive for light-headedness.    Objective:  BP (!) 105/57   Pulse (!) 57   Temp (!) 97.1 F (36.2 C) (Oral)   Ht 5\' 5"  (1.651 m)   Wt 205 lb 8 oz (93.2 kg)   BMI 34.20 kg/m   BP Readings  from Last 3 Encounters:  12/18/17 (!) 105/57  12/04/17 (!) 102/55  11/16/17 127/66    Wt Readings from Last 3 Encounters:  12/18/17 205 lb 8 oz (93.2 kg)  12/04/17 206 lb 2 oz (93.5 kg)  11/16/17 204 lb 9.6 oz (92.8 kg)     Physical Exam  Constitutional: She is oriented to person, place, and time. She appears well-developed and well-nourished. No distress.  Cardiovascular: Normal rate and regular rhythm.  Pulmonary/Chest: Breath sounds normal.  Musculoskeletal: She exhibits tenderness (For palpation of the left wrist at the hyperthenar  eminence and  for all range of motion.).  Neurological: She is alert and oriented to person, place, and time.  Skin: Skin is warm and dry.  Psychiatric: She has a normal mood and affect.      Assessment & Plan:   Doris Smith was seen today for wrist pain.  Diagnoses and all orders for this visit:  Imbalance  Bipolar disorder, in partial remission, most recent episode depressed ([place])  Left wrist pain   Will look into her referrals and make sure that the connections are made for her for physical therapy and ENT as both represent significant modalities for her.  Additionally my hope is that her psychiatrist can work on her medication regimen so that she is not dependent on so many medications that can worsen balance issues.    I have discontinued Doris Smith's cyclobenzaprine. I am also having her maintain her LORazepam, gabapentin, FLUoxetine, lamoTRIgine, fluticasone, esomeprazole, and nabumetone.  Allergies as of 12/18/2017      Reactions   Iohexol    "over 20 years ago" had reaction "hurting in arm and heart"-Done in [place], [place].Marland Kitchenphysician stopped CT at that time.   Codeine Other (See Comments)   Makes feel like she is wild   Prednisone Other (See Comments)   Makes me very irritable   Sulfonamide Derivatives Other (See Comments)   Upset stomach   Celecoxib Nausea And Vomiting   Sulfa Antibiotics Nausea And Vomiting   Vitamin B12 Rash      Medication List        Accurate as of 12/18/17 12:08 PM. Always use your most recent med list.          esomeprazole 40 MG capsule Commonly known as:  NEXIUM One daily 30 minutes before breakfast   FLUoxetine 20 MG capsule Commonly known as:  PROZAC Take 1 capsule by mouth 2 (two) times daily.   fluticasone 50 MCG/ACT nasal spray Commonly known as:  FLONASE Place 2 sprays into both nostrils daily.   gabapentin 300 MG capsule Commonly known as:  NEURONTIN Take 1 capsule (300 mg total) by mouth 3 (three) times daily.     lamoTRIgine 200 MG tablet Commonly known as:  LAMICTAL Take 100 mg by mouth 2 (two) times daily.   LORazepam 0.5 MG tablet Commonly known as:  ATIVAN Take 1 tablet (0.5 mg total) by mouth 2 (two) times daily as needed.   nabumetone 500 MG tablet Commonly known as:  RELAFEN Take 1 tablet (500 mg total) by mouth 2 (two) times daily.        Follow-up: Return in about 1 month (around 01/17/2018).  Claretta Fraise, M.D.

## 2018-01-08 DIAGNOSIS — F312 Bipolar disorder, current episode manic severe with psychotic features: Secondary | ICD-10-CM | POA: Diagnosis not present

## 2018-01-08 DIAGNOSIS — F410 Panic disorder [episodic paroxysmal anxiety] without agoraphobia: Secondary | ICD-10-CM | POA: Diagnosis not present

## 2018-01-08 DIAGNOSIS — F4310 Post-traumatic stress disorder, unspecified: Secondary | ICD-10-CM | POA: Diagnosis not present

## 2018-02-05 ENCOUNTER — Ambulatory Visit: Payer: BLUE CROSS/BLUE SHIELD | Admitting: Gastroenterology

## 2018-02-12 DIAGNOSIS — F4310 Post-traumatic stress disorder, unspecified: Secondary | ICD-10-CM | POA: Diagnosis not present

## 2018-02-12 DIAGNOSIS — F312 Bipolar disorder, current episode manic severe with psychotic features: Secondary | ICD-10-CM | POA: Diagnosis not present

## 2018-02-12 DIAGNOSIS — F410 Panic disorder [episodic paroxysmal anxiety] without agoraphobia: Secondary | ICD-10-CM | POA: Diagnosis not present

## 2018-02-22 DIAGNOSIS — Z7901 Long term (current) use of anticoagulants: Secondary | ICD-10-CM | POA: Diagnosis not present

## 2018-02-22 DIAGNOSIS — K589 Irritable bowel syndrome without diarrhea: Secondary | ICD-10-CM | POA: Diagnosis not present

## 2018-02-22 DIAGNOSIS — I10 Essential (primary) hypertension: Secondary | ICD-10-CM | POA: Diagnosis not present

## 2018-02-22 DIAGNOSIS — Z79899 Other long term (current) drug therapy: Secondary | ICD-10-CM | POA: Diagnosis not present

## 2018-02-22 DIAGNOSIS — R079 Chest pain, unspecified: Secondary | ICD-10-CM | POA: Diagnosis not present

## 2018-02-22 DIAGNOSIS — G4733 Obstructive sleep apnea (adult) (pediatric): Secondary | ICD-10-CM | POA: Diagnosis not present

## 2018-02-22 DIAGNOSIS — M25512 Pain in left shoulder: Secondary | ICD-10-CM | POA: Diagnosis not present

## 2018-02-22 DIAGNOSIS — R072 Precordial pain: Secondary | ICD-10-CM | POA: Diagnosis not present

## 2018-02-22 DIAGNOSIS — R002 Palpitations: Secondary | ICD-10-CM | POA: Diagnosis not present

## 2018-02-22 DIAGNOSIS — I4891 Unspecified atrial fibrillation: Secondary | ICD-10-CM | POA: Diagnosis not present

## 2018-02-22 DIAGNOSIS — R1110 Vomiting, unspecified: Secondary | ICD-10-CM | POA: Diagnosis not present

## 2018-02-22 DIAGNOSIS — R0789 Other chest pain: Secondary | ICD-10-CM | POA: Diagnosis not present

## 2018-02-22 DIAGNOSIS — R0602 Shortness of breath: Secondary | ICD-10-CM | POA: Diagnosis not present

## 2018-02-22 DIAGNOSIS — R61 Generalized hyperhidrosis: Secondary | ICD-10-CM | POA: Diagnosis not present

## 2018-02-22 DIAGNOSIS — F319 Bipolar disorder, unspecified: Secondary | ICD-10-CM | POA: Diagnosis not present

## 2018-02-22 DIAGNOSIS — Z9989 Dependence on other enabling machines and devices: Secondary | ICD-10-CM | POA: Diagnosis not present

## 2018-02-22 DIAGNOSIS — I480 Paroxysmal atrial fibrillation: Secondary | ICD-10-CM | POA: Diagnosis not present

## 2018-02-23 DIAGNOSIS — G4733 Obstructive sleep apnea (adult) (pediatric): Secondary | ICD-10-CM | POA: Diagnosis not present

## 2018-02-23 DIAGNOSIS — F319 Bipolar disorder, unspecified: Secondary | ICD-10-CM | POA: Diagnosis not present

## 2018-02-23 DIAGNOSIS — K589 Irritable bowel syndrome without diarrhea: Secondary | ICD-10-CM | POA: Diagnosis not present

## 2018-02-25 DIAGNOSIS — I499 Cardiac arrhythmia, unspecified: Secondary | ICD-10-CM | POA: Diagnosis not present

## 2018-02-26 ENCOUNTER — Other Ambulatory Visit: Payer: Self-pay | Admitting: *Deleted

## 2018-02-26 ENCOUNTER — Telehealth: Payer: Self-pay

## 2018-02-26 DIAGNOSIS — R42 Dizziness and giddiness: Secondary | ICD-10-CM

## 2018-02-26 NOTE — Progress Notes (Signed)
e

## 2018-02-26 NOTE — Telephone Encounter (Signed)
Pt was referred to ENT in May for vertigo. Pt did not go to appointment . Pt would like another referral placed for this

## 2018-02-26 NOTE — Telephone Encounter (Signed)
Attempted to contact patient.  Referral placed

## 2018-02-26 NOTE — Telephone Encounter (Signed)
Please refer as pt requests. Thanks, WS 

## 2018-04-09 DIAGNOSIS — F4310 Post-traumatic stress disorder, unspecified: Secondary | ICD-10-CM | POA: Diagnosis not present

## 2018-04-09 DIAGNOSIS — F312 Bipolar disorder, current episode manic severe with psychotic features: Secondary | ICD-10-CM | POA: Diagnosis not present

## 2018-04-09 DIAGNOSIS — F410 Panic disorder [episodic paroxysmal anxiety] without agoraphobia: Secondary | ICD-10-CM | POA: Diagnosis not present

## 2018-05-03 ENCOUNTER — Ambulatory Visit: Payer: BLUE CROSS/BLUE SHIELD | Admitting: Nurse Practitioner

## 2018-05-03 ENCOUNTER — Encounter: Payer: Self-pay | Admitting: Nurse Practitioner

## 2018-05-03 VITALS — BP 128/61 | HR 64 | Temp 97.2°F | Ht 65.0 in | Wt 207.0 lb

## 2018-05-03 DIAGNOSIS — R300 Dysuria: Secondary | ICD-10-CM

## 2018-05-03 DIAGNOSIS — B372 Candidiasis of skin and nail: Secondary | ICD-10-CM | POA: Diagnosis not present

## 2018-05-03 DIAGNOSIS — N949 Unspecified condition associated with female genital organs and menstrual cycle: Secondary | ICD-10-CM | POA: Diagnosis not present

## 2018-05-03 DIAGNOSIS — Z23 Encounter for immunization: Secondary | ICD-10-CM

## 2018-05-03 LAB — MICROSCOPIC EXAMINATION
RBC, UA: NONE SEEN /hpf (ref 0–2)
Renal Epithel, UA: NONE SEEN /hpf

## 2018-05-03 LAB — URINALYSIS, COMPLETE
Bilirubin, UA: NEGATIVE
Glucose, UA: NEGATIVE
Ketones, UA: NEGATIVE
Leukocytes, UA: NEGATIVE
Nitrite, UA: NEGATIVE
Protein, UA: NEGATIVE
RBC, UA: NEGATIVE
Specific Gravity, UA: <1.005 — ABNORMAL LOW (ref 1.005–1.030)
Urobilinogen, Ur: 0.2 mg/dL (ref 0.2–1.0)
pH, UA: 5.5 (ref 5.0–7.5)

## 2018-05-03 MED ORDER — NYSTATIN 100000 UNIT/GM EX CREA: 1.0000 "application " | TOPICAL_CREAM | Freq: Two times a day (BID) | CUTANEOUS | 0 refills | Status: DC

## 2018-05-03 MED ORDER — FLUCONAZOLE 150 MG PO TABS: ORAL_TABLET | ORAL | 0 refills | Status: DC

## 2018-05-03 NOTE — Progress Notes (Signed)
   Subjective:    Patient ID: Doris Smith, female    DOB: 08-01-1950, 67 y.o.   MRN: [id number]   Chief Complaint: vaginal burning and Dysuria   HPI Patient come sin today c/o vaginal burning and itching. Has been going on for over a month. Urine burns on outside when she pees.   Review of Systems  Respiratory: Negative.   Cardiovascular: Negative.   Genitourinary: Negative for dysuria, frequency, pelvic pain, urgency, vaginal bleeding, vaginal discharge and vaginal pain.  Neurological: Negative.   Psychiatric/Behavioral: Negative.   All other systems reviewed and are negative.      Objective:   Physical Exam  Constitutional: She is oriented to person, place, and time. She appears well-developed and well-nourished. No distress.  Cardiovascular: Normal rate and regular rhythm.  Pulmonary/Chest: Effort normal.  Genitourinary:  Genitourinary Comments: Perineum erythematous and moist appearing.  Neurological: She is alert and oriented to person, place, and time.  Psychiatric: She has a normal mood and affect. Her behavior is normal. Thought content normal.   BP 128/61   Pulse 64   Temp (!) 97.2 F (36.2 C) (Oral)   Ht 5\' 5"  (1.651 m)   Wt 207 lb (93.9 kg)   BMI 34.45 kg/m   Urine clear      Assessment & Plan:  Doris Smith in today with chief complaint of vaginal burning and Dysuria   1. Vaginal burning - Urinalysis, Complete  2. Dysuria - Urinalysis, Complete  3. Cutaneous candidiasis Keep area as dry as possible Avoid scratching Change to nonperfumed toilet paper. - nystatin cream (MYCOSTATIN); Apply 1 application topically 2 (two) times daily.  Dispense: 30 g; Refill: 0 - fluconazole (DIFLUCAN) 150 MG tablet; 1 po q week x 4 weeks  Dispense: 4 tablet; Refill: 0  [name] Done, FNP

## 2018-05-03 NOTE — Patient Instructions (Signed)
Skin Yeast Infection Skin yeast infection is a condition in which there is an overgrowth of yeast (candida) that normally lives on the skin. This condition usually occurs in areas of the skin that are constantly warm and moist, such as the armpits or the groin. What are the causes? This condition is caused by a change in the normal balance of the yeast and bacteria that live on the skin. What increases the risk? This condition is more likely to develop in:  People who are obese.  Pregnant women.  Women who take birth control pills.  People who have diabetes.  People who take antibiotic medicines.  People who take steroid medicines.  People who are malnourished.  People who have a weak defense (immune) system.  People who are 65 years of age or older.  What are the signs or symptoms? Symptoms of this condition include:  A red, swollen area of the skin.  Bumps on the skin.  Itchiness.  How is this diagnosed? This condition is diagnosed with a medical history and physical exam. Your health care provider may check for yeast by taking light scrapings of the skin to be viewed under a microscope. How is this treated? This condition is treated with medicine. Medicines may be prescribed or be available over-the-counter. The medicines may be:  Taken by mouth (orally).  Applied as a cream.  Follow these instructions at home:  Take or apply over-the-counter and prescription medicines only as told by your health care provider.  Eat more yogurt. This may help to keep your yeast infection from returning.  Maintain a healthy weight. If you need help losing weight, talk with your health care provider.  Keep your skin clean and dry.  If you have diabetes, keep your blood sugar under control. Contact a health care provider if:  Your symptoms go away and then return.  Your symptoms do not get better with treatment.  Your symptoms get worse.  Your rash spreads.  You have a  fever or chills.  You have new symptoms.  You have new warmth or redness of your skin. This information is not intended to replace advice given to you by your health care provider. Make sure you discuss any questions you have with your health care provider. Document Released: 03/14/2011 Document Revised: 02/20/2016 Document Reviewed: 12/28/2014 Elsevier Interactive Patient Education  2018 [employer] Inc[employer]  

## 2018-06-11 DIAGNOSIS — F312 Bipolar disorder, current episode manic severe with psychotic features: Secondary | ICD-10-CM | POA: Diagnosis not present

## 2018-06-11 DIAGNOSIS — F410 Panic disorder [episodic paroxysmal anxiety] without agoraphobia: Secondary | ICD-10-CM | POA: Diagnosis not present

## 2018-06-11 DIAGNOSIS — F4310 Post-traumatic stress disorder, unspecified: Secondary | ICD-10-CM | POA: Diagnosis not present

## 2018-07-11 ENCOUNTER — Ambulatory Visit: Payer: BLUE CROSS/BLUE SHIELD | Admitting: Nurse Practitioner

## 2018-07-11 ENCOUNTER — Encounter: Payer: Self-pay | Admitting: Nurse Practitioner

## 2018-07-11 VITALS — BP 106/48 | HR 76 | Temp 97.3°F | Ht 65.0 in | Wt 212.0 lb

## 2018-07-11 DIAGNOSIS — J069 Acute upper respiratory infection, unspecified: Secondary | ICD-10-CM

## 2018-07-11 DIAGNOSIS — N6489 Other specified disorders of breast: Secondary | ICD-10-CM | POA: Diagnosis not present

## 2018-07-11 DIAGNOSIS — L293 Anogenital pruritus, unspecified: Secondary | ICD-10-CM

## 2018-07-11 MED ORDER — DOXYCYCLINE HYCLATE 100 MG PO TABS: 100.0000 mg | ORAL_TABLET | Freq: Two times a day (BID) | ORAL | 0 refills | Status: DC

## 2018-07-11 MED ORDER — BENZONATATE 100 MG PO CAPS: 100.0000 mg | ORAL_CAPSULE | Freq: Three times a day (TID) | ORAL | 0 refills | Status: DC | PRN

## 2018-07-11 MED ORDER — CLOTRIMAZOLE-BETAMETHASONE 1-0.05 % EX CREA: 1.0000 "application " | TOPICAL_CREAM | Freq: Two times a day (BID) | CUTANEOUS | 0 refills | Status: DC

## 2018-07-11 NOTE — Patient Instructions (Signed)
Lichen Sclerosus  Lichen sclerosus is a skin problem. It can happen on any part of the body. It happens most often in the anal or genital areas. It can cause itching and discomfort. Treatment can help to control symptoms. It can also help prevent scarring that may lead to other problems.  What are the causes?  The cause of this condition is not known. It is not passed from one person to another (not contagious).  What increases the risk?  This condition is more likely to develop in women. It most often occurs after menopause.  What are the signs or symptoms?  Symptoms of this condition include:  Thin, wrinkled, white areas on the skin.  Thickened white areas on the skin.  Red and swollen patches (lesions) on the skin.  Tears or cracks in the skin.  Bruising.  Blood blisters.  Very bad itching.  Pain, itching, or burning when peeing (urinating).  Trouble pooping (constipation).  How is this diagnosed?  This condition may be diagnosed with a physical exam. A sample of your skin may also be removed to be looked at under a microscope (biopsy).  How is this treated?  This condition may be treated with:  Creams or ointments (topical steroids) that are put on the skin in the affected areas. This is the most common treatment.  Medicines that are taken by mouth.  Surgery. This is only needed if the condition is very bad and is causing problems such as scarring.  Follow these instructions at home:  Take or use over-the-counter and prescription medicines only as told by your doctor.  Use creams or ointments as told by your doctor.  Do not scratch the affected areas of skin.  If you are a woman, keep the vagina as clean and dry as you can.  Clean the affected area of skin gently with water. Avoid using rough towels or toilet paper.  Keep all follow-up visits as told by your doctor. This is important.  Contact a doctor if:  Your redness, swelling, or pain gets worse.  You have fluid, blood, or pus coming from the area.  You have  new red and swollen patches on your skin.  You have a fever.  You have pain during sex.  Summary  Lichen sclerosus is a skin problem. It can cause itching and discomfort.  This condition is usually treated with creams or ointments that are put on the skin in the affected areas.  Use medicines only as told by your doctor.  Do not scratch the affected areas of skin.  Keep all follow-up visits as told by your doctor. This is important.  This information is not intended to replace advice given to you by your health care provider. Make sure you discuss any questions you have with your health care provider.  Document Released: 06/08/2008 Document Revised: 11/08/2017 Document Reviewed: 11/08/2017  Elsevier Interactive Patient Education  2019 [employer]

## 2018-07-11 NOTE — Progress Notes (Addendum)
Subjective:    Patient ID: Doris Smith, female    DOB: 05-06-1951, 69 y.o.   MRN: [id number]   Chief Complaint: Breast Pain (right); Vaginal Itching (comes and goes); Sinus Problem; and very itrritable   HPI Patient comes in today C/O: -right breast pain- looks deformed. Noticed it about 1 month ago. She says right breast is fuller then left and it has not ever been that way. Last mammogram was many years ago- has been ordered but not done .- sinus headache, congestion and cough. Has been on mucinex d. She has a constant cough. This has been going on for 2 months.  - vaginal itching. She was given diflucan and nystatin cream which has not helped. Itching is intermittent. When asked the itching is mainly on the outside, not in vagina it self. - irritable- says she stays in a bad mood. Gets angry easily- has been going on for many years. She is now on lamictal and prozac. [facility] goes to [facility] and has follow up appointment next week.  Review of Systems  Constitutional: Negative.   HENT: Negative.   Respiratory: Negative.   Cardiovascular: Negative.   Gastrointestinal: Negative.   Genitourinary: Negative.   Neurological: Negative.   Psychiatric/Behavioral: Negative.   All other systems reviewed and are negative.      Objective:   Physical Exam Constitutional:      Appearance: She is obese.  HENT:     Right Ear: Tympanic membrane, ear canal and external ear normal.     Left Ear: Tympanic membrane, ear canal and external ear normal.     Nose: Nose normal.     Mouth/Throat:     Mouth: Mucous membranes are dry.  Eyes:     Extraocular Movements: Extraocular movements intact.     Conjunctiva/sclera: Conjunctivae normal.     Pupils: Pupils are equal, round, and reactive to light.  Neck:     Musculoskeletal: Normal range of motion.  Cardiovascular:     Rate and Rhythm: Regular rhythm.     Pulses: Normal pulses.     Heart sounds: Normal heart sounds.  Pulmonary:       Effort: Pulmonary effort is normal.     Breath sounds: Normal breath sounds.  Chest:     Breasts:        Right: Swelling, inverted nipple, mass (more of fullnes feeling rather than mass) and tenderness (slight) present.        Left: Normal. No inverted nipple, mass, nipple discharge, skin change or tenderness.  Lymphadenopathy:     Upper Body:     Right upper body: No supraclavicular, axillary or pectoral adenopathy.     Left upper body: No supraclavicular, axillary or pectoral adenopathy.  Skin:    General: Skin is warm and dry.  Neurological:     General: No focal deficit present.     Mental Status: She is alert.  Psychiatric:        Mood and Affect: Mood normal.        Behavior: Behavior normal.     BP (!) 106/48   Pulse 76   Temp (!) 97.3 F (36.3 C) (Oral)   Ht 5\' 5"  (1.651 m)   Wt 212 lb (96.2 kg)   BMI 35.28 kg/m        Assessment & Plan:  Doris Smith in today with chief complaint of Breast Pain (right); Vaginal Itching (comes and goes); Sinus Problem; and very itrritable   1. Perineal  itching, female Keep area clean and dry - clotrimazole-betamethasone (LOTRISONE) cream; Apply 1 application topically 2 (two) times daily.  Dispense: 30 g; Refill: 0  2. Fullness of breast continue to watch breast - MM DIAG BREAST TOMO UNI RIGHT; Future  3. Upper respiratory tract infection, unspecified type 1. Take meds as prescribed 2. Use a cool mist humidifier especially during the winter months and when heat has been humid. 3. Use saline nose sprays frequently 4. Saline irrigations of the nose can be very helpful if done frequently.  * 4X daily for 1 week*  * Use of a nettie pot can be helpful with this. Follow directions with this* 5. Drink plenty of fluids 6. Keep thermostat turn down low 7.For any cough or congestion  Use plain Mucinex- regular strength or max strength is fine   * Children- consult with Pharmacist for dosing 8. For fever or aces or pains-  take tylenol or ibuprofen appropriate for age and weight.  * for fevers greater than 101 orally you may alternate ibuprofen and tylenol every  3 hours.    - doxycycline (VIBRA-TABS) 100 MG tablet; Take 1 tablet (100 mg total) by mouth 2 (two) times daily. 1 po bid  Dispense: 20 tablet; Refill: 0 - benzonatate (TESSALON PERLES) 100 MG capsule; Take 1 capsule (100 mg total) by mouth 3 (three) times daily as needed for cough.  Dispense: 20 capsule; Refill: 0  4. irritablity-  Keep psych appointment for medication changes    [name] Done, FNP

## 2018-07-22 ENCOUNTER — Telehealth: Payer: Self-pay

## 2018-07-22 ENCOUNTER — Other Ambulatory Visit: Payer: Self-pay | Admitting: *Deleted

## 2018-07-22 NOTE — Telephone Encounter (Signed)
Is she talking about her mammogram order?

## 2018-07-22 NOTE — Telephone Encounter (Signed)
Patient knows referral is in but she wants to know why [facility] has not called to give her an appointment ?

## 2018-07-22 NOTE — Telephone Encounter (Signed)
Patient Doris Smith wondering about her referral   (No referral placed)

## 2018-07-23 ENCOUNTER — Other Ambulatory Visit: Payer: Self-pay | Admitting: Nurse Practitioner

## 2018-07-23 DIAGNOSIS — N6489 Other specified disorders of breast: Secondary | ICD-10-CM

## 2018-07-23 NOTE — Telephone Encounter (Signed)
Spoke with patient. She is aware we had to request films and of her appointment date/time

## 2018-07-31 ENCOUNTER — Ambulatory Visit
Admission: RE | Admit: 2018-07-31 | Discharge: 2018-07-31 | Disposition: A | Discharge status: Home / Self Care | Payer: Medicare Other | Source: Ambulatory Visit | Attending: Nurse Practitioner | Admitting: Nurse Practitioner

## 2018-07-31 ENCOUNTER — Other Ambulatory Visit: Payer: Self-pay | Admitting: Nurse Practitioner

## 2018-07-31 DIAGNOSIS — N6489 Other specified disorders of breast: Secondary | ICD-10-CM

## 2018-07-31 DIAGNOSIS — R922 Inconclusive mammogram: Secondary | ICD-10-CM | POA: Diagnosis not present

## 2018-07-31 DIAGNOSIS — Z803 Family history of malignant neoplasm of breast: Secondary | ICD-10-CM | POA: Diagnosis not present

## 2018-07-31 DIAGNOSIS — N6310 Unspecified lump in the right breast, unspecified quadrant: Secondary | ICD-10-CM

## 2018-08-02 ENCOUNTER — Ambulatory Visit
Admission: RE | Admit: 2018-08-02 | Discharge: 2018-08-02 | Disposition: A | Discharge status: Home / Self Care | Payer: Medicare Other | Source: Ambulatory Visit | Attending: Nurse Practitioner | Admitting: Nurse Practitioner

## 2018-08-02 DIAGNOSIS — N6312 Unspecified lump in the right breast, upper inner quadrant: Secondary | ICD-10-CM | POA: Diagnosis not present

## 2018-08-02 DIAGNOSIS — C50811 Malignant neoplasm of overlapping sites of right female breast: Secondary | ICD-10-CM | POA: Diagnosis not present

## 2018-08-02 DIAGNOSIS — N6310 Unspecified lump in the right breast, unspecified quadrant: Secondary | ICD-10-CM

## 2018-08-02 DIAGNOSIS — N6311 Unspecified lump in the right breast, upper outer quadrant: Secondary | ICD-10-CM | POA: Diagnosis not present

## 2018-08-08 ENCOUNTER — Encounter: Payer: Self-pay | Admitting: *Deleted

## 2018-08-08 ENCOUNTER — Telehealth: Payer: Self-pay | Admitting: Nurse Practitioner

## 2018-08-08 ENCOUNTER — Telehealth: Payer: Self-pay | Admitting: Hematology and Oncology

## 2018-08-08 ENCOUNTER — Telehealth: Payer: Self-pay | Admitting: Oncology

## 2018-08-08 DIAGNOSIS — C50211 Malignant neoplasm of upper-inner quadrant of right female breast: Secondary | ICD-10-CM | POA: Insufficient documentation

## 2018-08-08 DIAGNOSIS — Z170 Estrogen receptor positive status [ER+]: Secondary | ICD-10-CM

## 2018-08-08 DIAGNOSIS — C50411 Malignant neoplasm of upper-outer quadrant of right female breast: Secondary | ICD-10-CM | POA: Insufficient documentation

## 2018-08-08 NOTE — Telephone Encounter (Signed)
Called patient unable to leave message, called the patient 1/27 and today as well as sent an email for a return call, will send a letter to try and confirm appointment for [facility] 2/5

## 2018-08-08 NOTE — Telephone Encounter (Signed)
Made contact with patient to confirm morning [facility] appointment for 2/5, packet mailed and emailed to patient

## 2018-08-13 DIAGNOSIS — F410 Panic disorder [episodic paroxysmal anxiety] without agoraphobia: Secondary | ICD-10-CM | POA: Diagnosis not present

## 2018-08-13 DIAGNOSIS — F312 Bipolar disorder, current episode manic severe with psychotic features: Secondary | ICD-10-CM | POA: Diagnosis not present

## 2018-08-13 DIAGNOSIS — F4310 Post-traumatic stress disorder, unspecified: Secondary | ICD-10-CM | POA: Diagnosis not present

## 2018-08-13 NOTE — Progress Notes (Signed)
[place]  [phone] [phone]     ID: Khalis Hittle Langbehn DOB: [birth year]  MR#: [id number]  DJS#:[id number]  Patient Care Team: Chevis Pretty, FNP as PCP - General (Nurse Practitioner) Alphonsa Overall, MD as Consulting Physician (General Surgery) Evona Westra, Virgie Dad, MD as Consulting Physician (Oncology) Kyung Rudd, MD as Consulting Physician (Radiation Oncology) Elijio Miles, MD as Consulting Physician (Pulmonary Disease) Netta Cedars, MD as Consulting Physician (Orthopedic Surgery) Susa Day, MD as Consulting Physician (Orthopedic Surgery) Janeth Rase, NP as Nurse Practitioner (Adult Health Nurse Practitioner) Chauncey Cruel, MD OTHER MD: Chapman Moss, psychiatry   CHIEF COMPLAINT: Estrogen receptor positive breast cancer  CURRENT TREATMENT: Awaiting definitive surgery   HISTORY OF CURRENT ILLNESS: "Doris Smith" presented to her PCP, Dr. Hassell Done, with right breast pain, fullness, and nipple inversion since November 2019. She proceeded to undergo bilateral diagnostic mammography with tomography and right breast ultrasonography at [facility] on 07/31/2018 showing: findings compatible with multicentric breast cancer spanning at least the upper inner and upper outer quadrants of the right breast; normal right axilla. Palpable firmness of approximately 1.5 cm was noted in the 9 o'clock position, which was also seen on ultrasound with indisctinct margins measuring 2.7 x 2.3 x 2.2 cm. At 1 o'clock, a mass with poorly defined margins measures 1.9 x 1 x 0.8 cm. Additional poorly defined masses were seen in the 12 o'clock retroareolar region.  Accordingly on 08/02/2018 she proceeded to biopsy of the right breast area in question. The pathology ([id number]) from this procedure showed: invasive mammary carcinoma at 9 o'clock and 1 o'clock; e-cadherin negative, consistent with lobular phenotype; grade 2-3. Prognostic indicators significant  for: estrogen receptor, 90% positive and progesterone receptor, 1% positive, both with strong staining intensity. Proliferation marker Ki67 at 1%. HER2 equivocal by immunohistochemistry, 2+ but negative by fluorescent in situ hybridization with a signals ratio 1.04 and number per cell 1.2.   The patient's subsequent history is as detailed below.   INTERVAL HISTORY: Doris Smith was evaluated in the multidisciplinary breast cancer clinic on 08/14/2018.Marland Kitchen Her case was also presented at the multidisciplinary breast cancer conference on the same day. At that time a preliminary plan was proposed: right mastectomy with sentinel lymph node biopsy, chest CT/brain CT/breast MRI/bone scan, and oncotype testing. Possible genetics, dependent upon maternal aunt's diagnosis.   REVIEW OF SYSTEMS: Adia Crammer Risdon reports doing well overall. Per patient form, she reports loss of sleep, fatigue, pain (muscle ache/joint pain) in her back and knees, wears glasses, sinus problems, shortness of breath at rest and with walking, cough, heartburn, hernia (hiatial), arthritis with difficulty walking, headaches (sinus), anxiety, and depression. There were no specific symptoms leading to the original mammogram, which was routinely scheduled. The patient denies unusual headaches, visual changes, nausea, vomiting, stiff neck, dizziness, or gait imbalance. There has been no cough, phlegm production, or pleurisy, no chest pain or pressure, and no change in bowel or bladder habits. The patient denies fever, rash, bleeding, unexplained fatigue or unexplained weight loss. A detailed review of systems was otherwise entirely negative.   PAST MEDICAL HISTORY: Past Medical History:  Diagnosis Date  . A-fib ([place]) 11/2012  . Anxiety    takes Ativan  . Asthma 04/07/2011   dx  . Depression   . Fatty liver   . Fibromyalgia   . Fracture, ankle 10/2008   right and left  . GERD (gastroesophageal reflux disease)   . Headache(784.0)     migraines  . Irritable bowel syndrome   .  Mental disorder    dx bipolar  . PONV (postoperative nausea and vomiting)   . Sleep apnea 04/2011    No CPAP  She has chronic sinus headaches, hiatal hernia   PAST SURGICAL HISTORY: Past Surgical History:  Procedure Laterality Date  . DILATION AND CURETTAGE OF UTERUS  1981   abnormal pap  . KNEE ARTHROSCOPY Left 2007   x 2  . LUMBAR LAMINECTOMY/DECOMPRESSION MICRODISCECTOMY  05/31/2011   Procedure: LUMBAR LAMINECTOMY/DECOMPRESSION MICRODISCECTOMY;  Surgeon: Johnn Hai;  Location: [facility];  Service: Orthopedics;  Laterality: Left;  Decompression Lumbar four to five and  Lumbar five to Sacral one on Left  (X-Ray)  . PARTIAL HYSTERECTOMY  1982  . TONSILLECTOMY     as child  . TOTAL KNEE ARTHROPLASTY Left 12/18/2005    FAMILY HISTORY Family History  Problem Relation Age of Onset  . Anesthesia problems Mother   . Heart disease Mother        CHF, atrial fib  . Heart failure Mother   . Anesthesia problems Sister   . Colon polyps Father   . Heart disease Father        "Fluid around the heart"  . Hypertension Sister   . Breast cancer Maternal Aunt   . Colon cancer Maternal Aunt   . Prostate cancer Neg Hx   . Ovarian cancer Neg Hx    As of February 2019, patient father is alive at 66 years old. Patient mother is alive at 66 years old. She notes a family hx of breast cancer. A maternal aunt was diagnosed with breast cancer, but Doris Smith is unsure if it was in one or both breasts. She has 3 siblings, 3 sisters and 0 brothers.  GYNECOLOGIC HISTORY:  No LMP recorded. Patient is postmenopausal. Menarche: 68 years old Age at first live birth: 68 years old Elim P 2 LMP about 42 years ago Contraceptive no HRT no  Hysterectomy? partial So? no   SOCIAL HISTORY: Doris Smith worked in [employer] for 15 years. She is on disability because her back, knees, and nerves. Her husband, [name], has been at [employer] 43 years as a Freight forwarder.   Even though their home is in [place] they are actually living in [place] in an apartment while her husband works there.  Son Legrand Como, age 18, works as a Chief Strategy Officer in [place], [place]. Son Shanon Brow, age 20, works as a Building control surveyor in [place], [place]. They have 4 grandchildren, 2 great-grandchildren with 2 more on the way. She attends a [employer].     ADVANCED DIRECTIVES:    HEALTH MAINTENANCE: Social History   Tobacco Use  . Smoking status: Never Smoker  . Smokeless tobacco: Never Used  Substance Use Topics  . Alcohol use: No  . Drug use: No     Colonoscopy: 2019  PAP: 11/03/2014, normal  Bone density: 10/13/2016, T-score of -2.1   Allergies  Allergen Reactions  . Iohexol     "over 20 years ago" had reaction "hurting in arm and heart"-Done in [place], [place].Marland Kitchenphysician stopped CT at that time.  . Codeine Other (See Comments)    Makes feel like she is wild   . Prednisone Other (See Comments)    Makes me very irritable  . Sulfonamide Derivatives Other (See Comments)    Upset stomach  . Celecoxib Nausea And Vomiting  . Sulfa Antibiotics Nausea And Vomiting  . Vitamin B12 Rash    Current Outpatient Medications  Medication Sig Dispense Refill  . benzonatate (TESSALON  PERLES) 100 MG capsule Take 1 capsule (100 mg total) by mouth 3 (three) times daily as needed for cough. 20 capsule 0  . clotrimazole-betamethasone (LOTRISONE) cream Apply 1 application topically 2 (two) times daily. 30 g 0  . esomeprazole (NEXIUM) 40 MG capsule One daily 30 minutes before breakfast 30 capsule 1  . FLUoxetine (PROZAC) 20 MG capsule Take 1 capsule by mouth 2 (two) times daily.    Marland Kitchen gabapentin (NEURONTIN) 300 MG capsule Take 1 capsule (300 mg total) by mouth 3 (three) times daily. 90 capsule 5  . lamoTRIgine (LAMICTAL) 200 MG tablet Take 100 mg by mouth 2 (two) times daily.    Marland Kitchen LORazepam (ATIVAN) 0.5 MG tablet Take 1 tablet (0.5 mg total) by mouth 2 (two) times daily as needed. 30 tablet 2  . montelukast (SINGULAIR)  10 MG tablet Take 10 mg by mouth at bedtime.    . traZODone (DESYREL) 50 MG tablet Take 50 mg by mouth at bedtime. 1/2 tablet at night as needed     No current facility-administered medications for this visit.     OBJECTIVE: Middle-aged white woman in no acute distress  Vitals:   08/14/18 0836  BP: (!) 130/52  Pulse: 65  Resp: 18  Temp: 97.8 F (36.6 C)  SpO2: 99%     Body mass index is 34.43 kg/m.   Wt Readings from Last 3 Encounters:  08/14/18 206 lb 14.4 oz (93.8 kg)  07/11/18 212 lb (96.2 kg)  05/03/18 207 lb (93.9 kg)      ECOG FS:1 - Symptomatic but completely ambulatory  Ocular: Sclerae unicteric, pupils round and equal Ear-nose-throat: Oropharynx clear and moist Lymphatic: No cervical or supraclavicular adenopathy Lungs no rales or rhonchi Heart regular rate and rhythm Abd soft, nontender, positive bowel sounds MSK no focal spinal tenderness, no joint edema Neuro: non-focal, well-oriented, appropriate affect Breasts: The right breast is diffusely involved by a mass or masses, which are however movable.  There is an ecchymosis from the recent biopsy but no apparent skin involvement.  The nipple is minimally retracted as compared to the left side.  The left side is benign.  Both axillae are benign.   LAB RESULTS:  CMP     Component Value Date/Time   NA 143 08/14/2018 0758   NA 142 10/13/2016 1045   K 4.1 08/14/2018 0758   CL 108 08/14/2018 0758   CO2 28 08/14/2018 0758   GLUCOSE 87 08/14/2018 0758   BUN 11 08/14/2018 0758   BUN 8 10/13/2016 1045   CREATININE 0.83 08/14/2018 0758   CALCIUM 8.7 (L) 08/14/2018 0758   PROT 6.6 08/14/2018 0758   PROT 6.3 10/13/2016 1045   ALBUMIN 3.6 08/14/2018 0758   ALBUMIN 3.9 10/13/2016 1045   AST 14 (L) 08/14/2018 0758   ALT 10 08/14/2018 0758   ALKPHOS 97 08/14/2018 0758   BILITOT 0.5 08/14/2018 0758   GFRNONAA >60 08/14/2018 0758   GFRAA >60 08/14/2018 0758    No results found for: TOTALPROTELP, ALBUMINELP,  A1GS, A2GS, BETS, BETA2SER, GAMS, MSPIKE, SPEI  No results found for: Nils Pyle, [facility]  Lab Results  Component Value Date   WBC 6.4 08/14/2018   NEUTROABS 4.4 08/14/2018   HGB 12.7 08/14/2018   HCT 39.5 08/14/2018   MCV 90.0 08/14/2018   PLT 242 08/14/2018    '@LASTCHEMISTRY' @  No results found for: LABCA2  No components found for: HQIONG295  No results for input(s): INR in the last 168 hours.  No results  found for: LABCA2  No results found for: XQJ194  No results found for: RDE081  No results found for: KGY185  No results found for: CA2729  No components found for: HGQUANT  No results found for: CEA1 / No results found for: CEA1   No results found for: AFPTUMOR  No results found for: [place]  No results found for: PSA1  Appointment on 08/14/2018  Component Date Value Ref Range Status  . Sodium 08/14/2018 143  135 - 145 mmol/L Final  . Potassium 08/14/2018 4.1  3.5 - 5.1 mmol/L Final  . Chloride 08/14/2018 108  98 - 111 mmol/L Final  . CO2 08/14/2018 28  22 - 32 mmol/L Final  . Glucose, Bld 08/14/2018 87  70 - 99 mg/dL Final  . BUN 08/14/2018 11  8 - 23 mg/dL Final  . Creatinine 08/14/2018 0.83  0.44 - 1.00 mg/dL Final  . Calcium 08/14/2018 8.7* 8.9 - 10.3 mg/dL Final  . Total Protein 08/14/2018 6.6  6.5 - 8.1 g/dL Final  . Albumin 08/14/2018 3.6  3.5 - 5.0 g/dL Final  . AST 08/14/2018 14* 15 - 41 U/L Final  . ALT 08/14/2018 10  0 - 44 U/L Final  . Alkaline Phosphatase 08/14/2018 97  38 - 126 U/L Final  . Total Bilirubin 08/14/2018 0.5  0.3 - 1.2 mg/dL Final  . GFR, Est Non Af Am 08/14/2018 >60  >60 mL/min Final  . GFR, Est AFR Am 08/14/2018 >60  >60 mL/min Final  . Anion gap 08/14/2018 7  5 - 15 Final   Performed at [facility], [place] [address], [place], [place] [address]  . WBC Count 08/14/2018 6.4  4.0 - 10.5 K/uL Final  . RBC 08/14/2018 4.39  3.87 - 5.11 MIL/uL Final  . Hemoglobin 08/14/2018 12.7  12.0  - 15.0 g/dL Final  . HCT 08/14/2018 39.5  36.0 - 46.0 % Final  . MCV 08/14/2018 90.0  80.0 - 100.0 fL Final  . MCH 08/14/2018 28.9  26.0 - 34.0 pg Final  . MCHC 08/14/2018 32.2  30.0 - 36.0 g/dL Final  . RDW 08/14/2018 13.2  11.5 - 15.5 % Final  . Platelet Count 08/14/2018 242  150 - 400 K/uL Final  . nRBC 08/14/2018 0.0  0.0 - 0.2 % Final  . Neutrophils Relative % 08/14/2018 69  % Final  . Neutro Abs 08/14/2018 4.4  1.7 - 7.7 K/uL Final  . Lymphocytes Relative 08/14/2018 17  % Final  . Lymphs Abs 08/14/2018 1.1  0.7 - 4.0 K/uL Final  . Monocytes Relative 08/14/2018 12  % Final  . Monocytes Absolute 08/14/2018 0.8  0.1 - 1.0 K/uL Final  . Eosinophils Relative 08/14/2018 1  % Final  . Eosinophils Absolute 08/14/2018 0.1  0.0 - 0.5 K/uL Final  . Basophils Relative 08/14/2018 1  % Final  . Basophils Absolute 08/14/2018 0.1  0.0 - 0.1 K/uL Final  . Immature Granulocytes 08/14/2018 0  % Final  . Abs Immature Granulocytes 08/14/2018 0.02  0.00 - 0.07 K/uL Final   Performed at [facility], [place] [address] [place], [place] [address]    (this displays the last labs from the last 3 days)  No results found for: TOTALPROTELP, ALBUMINELP, A1GS, A2GS, BETS, BETA2SER, GAMS, MSPIKE, SPEI (this displays SPEP labs)  No results found for: KPAFRELGTCHN, LAMBDASER, KAPLAMBRATIO (kappa/lambda light chains)  No results found for: HGBA, HGBA2QUANT, HGBFQUANT, HGBSQUAN (Hemoglobinopathy evaluation)   No results found for: LDH  No results found for: IRON, TIBC, IRONPCTSAT (Iron and TIBC)  No results found for: FERRITIN  Urinalysis    Component Value Date/Time   COLORURINE YELLOW 05/25/2011 [place] 05/03/2018 1204   LABSPEC 1.011 05/25/2011 0923   PHURINE 7.0 05/25/2011 0923   GLUCOSEU Negative 05/03/2018 [place] 05/25/2011 0923   BILIRUBINUR Negative 05/03/2018 [place] 05/25/2011 0923   PROTEINUR Negative 05/03/2018  [place] 05/25/2011 0923   UROBILINOGEN negative 11/03/2014 1039   UROBILINOGEN 0.2 05/25/2011 0923   NITRITE Negative 05/03/2018 1204   NITRITE NEGATIVE 05/25/2011 0923   LEUKOCYTESUR Negative 05/03/2018 1204     STUDIES: US Breast Ltd Uni Right Inc Axilla  Result Date: 07/31/2018 CLINICAL DATA:  Right breast firmness and nipple inversion for the past 2 months. Family history of breast cancer a maternal aunt. EXAM: DIGITAL DIAGNOSTIC BILATERAL MAMMOGRAM WITH CAD AND TOMO ULTRASOUND RIGHT BREAST COMPARISON:  Previous exam(s). ACR Breast Density Category c: The breast tissue is heterogeneously dense, which may obscure small masses. FINDINGS: Interval diffuse distortion throughout heterogeneously dense glandular tissue in the right breast involving all 4 quadrants. This spans an area measuring approximately 9.7 x 9 3 x 5.0 cm. No skin thickening is seen. The left breast is unremarkable. There are no visible enlarged lymph nodes. Mammographic images were processed with CAD. On physical exam, there is soft tissue firmness throughout the upper right breast. There is also an approximately 1.5 cm palpable mass-like area in the 9 o'clock position, 6 cm from the nipple. There are no palpable right axillary lymph nodes. Targeted ultrasound is performed, showing poorly defined, irregular, mass-like area is throughout the upper inner right breast and upper outer right breast extending from approximately the 2:00 to 9:00 positions. These areas are associated with posterior acoustical shadowing. One of these corresponds to the palpable mass-like area in the 9 o'clock position, 6 cm from the nipple. This is an irregular, hypoechoic area with indistinct margins measuring 2.7 x 2.3 x 2.2 cm. In the 1 o'clock position, 3 cm from the nipple, there is a 1.9 x 1.0 x 0.8 cm hypoechoic mass with poorly defined margins. At real time, this has an irregular appearance and there is associated posterior acoustical  shadowing. There are also multiple additional poorly defined, irregular, hypoechoic masses with associated posterior acoustical shadowing in the 12 o'clock retroareolar region of the breast. Ultrasound of the right axilla demonstrated normal appearing right axillary lymph nodes. IMPRESSION: 1. Findings compatible with multicentric breast cancer spanning at least the upper inner and upper outer quadrants of the right breast and retroareolar regions. 2. No evidence of malignancy in the left breast and no adenopathy. RECOMMENDATION: Ultrasound-guided core needle biopsies of the 2.7 cm mass in the 9 o'clock position of the right breast and 1.9 cm mass in the 1 o'clock position of the right breast. This has been discussed with the patient and scheduled at 2:45 p.m. 08/02/2018. I have discussed the findings and recommendations with the patient. Results were also provided in writing at the conclusion of the visit. If applicable, a reminder letter will be sent to the patient regarding the next appointment. BI-RADS CATEGORY  5: Highly suggestive of malignancy. Electronically Signed   By: Claudie Revering M.D.   On: 07/31/2018 09:46   Mm Diag Breast Tomo Bilateral  Result Date: 07/31/2018 CLINICAL DATA:  Right breast firmness and nipple inversion for the past 2 months. Family history of breast cancer  a maternal aunt. EXAM: DIGITAL DIAGNOSTIC BILATERAL MAMMOGRAM WITH CAD AND TOMO ULTRASOUND RIGHT BREAST COMPARISON:  Previous exam(s). ACR Breast Density Category c: The breast tissue is heterogeneously dense, which may obscure small masses. FINDINGS: Interval diffuse distortion throughout heterogeneously dense glandular tissue in the right breast involving all 4 quadrants. This spans an area measuring approximately 9.7 x 9 3 x 5.0 cm. No skin thickening is seen. The left breast is unremarkable. There are no visible enlarged lymph nodes. Mammographic images were processed with CAD. On physical exam, there is soft tissue firmness  throughout the upper right breast. There is also an approximately 1.5 cm palpable mass-like area in the 9 o'clock position, 6 cm from the nipple. There are no palpable right axillary lymph nodes. Targeted ultrasound is performed, showing poorly defined, irregular, mass-like area is throughout the upper inner right breast and upper outer right breast extending from approximately the 2:00 to 9:00 positions. These areas are associated with posterior acoustical shadowing. One of these corresponds to the palpable mass-like area in the 9 o'clock position, 6 cm from the nipple. This is an irregular, hypoechoic area with indistinct margins measuring 2.7 x 2.3 x 2.2 cm. In the 1 o'clock position, 3 cm from the nipple, there is a 1.9 x 1.0 x 0.8 cm hypoechoic mass with poorly defined margins. At real time, this has an irregular appearance and there is associated posterior acoustical shadowing. There are also multiple additional poorly defined, irregular, hypoechoic masses with associated posterior acoustical shadowing in the 12 o'clock retroareolar region of the breast. Ultrasound of the right axilla demonstrated normal appearing right axillary lymph nodes. IMPRESSION: 1. Findings compatible with multicentric breast cancer spanning at least the upper inner and upper outer quadrants of the right breast and retroareolar regions. 2. No evidence of malignancy in the left breast and no adenopathy. RECOMMENDATION: Ultrasound-guided core needle biopsies of the 2.7 cm mass in the 9 o'clock position of the right breast and 1.9 cm mass in the 1 o'clock position of the right breast. This has been discussed with the patient and scheduled at 2:45 p.m. 08/02/2018. I have discussed the findings and recommendations with the patient. Results were also provided in writing at the conclusion of the visit. If applicable, a reminder letter will be sent to the patient regarding the next appointment. BI-RADS CATEGORY  5: Highly suggestive of  malignancy. Electronically Signed   By: Claudie Revering M.D.   On: 07/31/2018 09:46   Mm Clip Placement Right  Result Date: 08/02/2018 CLINICAL DATA:  Patient is post ultrasound-guided core needle biopsy of irregular hypoechoic masses at the 9 o'clock position 6 cm from the nipple and 1 o'clock position 3 cm from the nipple right breast. EXAM: DIAGNOSTIC RIGHT MAMMOGRAM POST ULTRASOUND BIOPSY COMPARISON:  Previous exam(s). FINDINGS: Mammographic images were obtained following ultrasound guided biopsy of the targeted masses at the 9 o'clock position and 1 o'clock position. Images demonstrates satisfactory placement of a ribbon shaped metallic clip over the biopsied mass at the 9 o'clock position and satisfactory placement of a coil shaped clip over the biopsied mass at the 1 o'clock position. IMPRESSION: Satisfactory clip placement post ultrasound-guided core needle biopsy 2 right breast masses as described. Final Assessment: Post Procedure Mammograms for Marker Placement Electronically Signed   By: Marin Olp M.D.   On: 08/02/2018 15:57   [place] Rt Breast Bx W Loc Dev 1st Lesion Img Bx Spec US Guide  Addendum Date: 08/06/2018   ADDENDUM REPORT: 08/06/2018 06:41 ADDENDUM: Pathology  revealed GRADE II-III INVASIVE MAMMARY CARCINOMA of the Right breast, both locations, 9 o'clock, 6 cmfn and 1 o'clock, 3 cmfn. This was found to be concordant by Dr. Marin Olp. Pathology results were discussed with the patient by telephone. The patient reported doing well after the biopsies with tenderness at the sites. Post biopsy instructions and care were reviewed and questions were answered. The patient was encouraged to call [facility] for any additional concerns. The patient was referred to [facility] on August 14, 2018. Pathology results reported by Terie Purser, RN on 08/06/2018. Electronically Signed   By: Marin Olp  M.D.   On: 08/06/2018 06:41   Result Date: 08/06/2018 CLINICAL DATA:  Patient presents for ultrasound-guided core needle biopsy of 2 sites in the right breast. Recent diagnostic imaging demonstrates diffuse distortion and multiple irregular shadowing hypoechoic areas throughout the upper half of the right breast and retroareolar region. Biopsy will target masses at the 9 o'clock and 1 o'clock positions. EXAM: ULTRASOUND GUIDED RIGHT BREAST CORE NEEDLE BIOPSY COMPARISON:  Previous exam(s). FINDINGS: I met with the patient and we discussed the procedure of ultrasound-guided biopsy, including benefits and alternatives. We discussed the high likelihood of a successful procedure. We discussed the risks of the procedure, including infection, bleeding, tissue injury, clip migration, and inadequate sampling. Informed written consent was given. The usual time-out protocol was performed immediately prior to the procedure. Lesion quadrant: Right upper outer quadrant (9-9:30 position). Using sterile technique and 1% Lidocaine as local anesthetic, under direct ultrasound visualization, a 12 gauge spring-loaded device was used to perform biopsy of the targeted 2.7 cm mass at the 9 o'clock position using a inferior to superior approach. Three adequate tissue specimens were obtained. At the conclusion of the procedure a ribbon shaped tissue marker clip was deployed into the biopsy cavity. Follow up 2 view mammogram was performed and dictated separately. Lesion quadrant: Right upper inner quadrant (1 o'clock position). Using sterile technique and 1% Lidocaine as local anesthetic, under direct ultrasound visualization, a 12 gauge spring-loaded device was used to perform biopsy of the 1.9 cm mass at the 1 o'clock position using a medial to lateral approach. Three tissue specimens were obtained. At the conclusion of the procedure a coil shaped tissue marker clip was deployed into the biopsy cavity. Follow up 2 view mammogram was  performed and dictated separately. IMPRESSION: Ultrasound guided biopsy of 2 suspicious masses within the right breast at the 9 o'clock and 1 o'clock positions. No apparent complications. Electronically Signed: By: Marin Olp M.D. On: 08/02/2018 15:48   [place] Rt Breast Bx W Loc Dev Ea Add Lesion Img Bx Spec US Guide  Addendum Date: 08/06/2018   ADDENDUM REPORT: 08/06/2018 06:41 ADDENDUM: Pathology revealed GRADE II-III INVASIVE MAMMARY CARCINOMA of the Right breast, both locations, 9 o'clock, 6 cmfn and 1 o'clock, 3 cmfn. This was found to be concordant by Dr. Marin Olp. Pathology results were discussed with the patient by telephone. The patient reported doing well after the biopsies with tenderness at the sites. Post biopsy instructions and care were reviewed and questions were answered. The patient was encouraged to call [facility] for any additional concerns. The patient was referred to [facility] on August 14, 2018. Pathology results reported by Terie Purser, RN on 08/06/2018. Electronically Signed   By: Marin Olp M.D.  On: 08/06/2018 06:41   Result Date: 08/06/2018 CLINICAL DATA:  Patient presents for ultrasound-guided core needle biopsy of 2 sites in the right breast. Recent diagnostic imaging demonstrates diffuse distortion and multiple irregular shadowing hypoechoic areas throughout the upper half of the right breast and retroareolar region. Biopsy will target masses at the 9 o'clock and 1 o'clock positions. EXAM: ULTRASOUND GUIDED RIGHT BREAST CORE NEEDLE BIOPSY COMPARISON:  Previous exam(s). FINDINGS: I met with the patient and we discussed the procedure of ultrasound-guided biopsy, including benefits and alternatives. We discussed the high likelihood of a successful procedure. We discussed the risks of the procedure, including infection, bleeding, tissue injury, clip migration, and  inadequate sampling. Informed written consent was given. The usual time-out protocol was performed immediately prior to the procedure. Lesion quadrant: Right upper outer quadrant (9-9:30 position). Using sterile technique and 1% Lidocaine as local anesthetic, under direct ultrasound visualization, a 12 gauge spring-loaded device was used to perform biopsy of the targeted 2.7 cm mass at the 9 o'clock position using a inferior to superior approach. Three adequate tissue specimens were obtained. At the conclusion of the procedure a ribbon shaped tissue marker clip was deployed into the biopsy cavity. Follow up 2 view mammogram was performed and dictated separately. Lesion quadrant: Right upper inner quadrant (1 o'clock position). Using sterile technique and 1% Lidocaine as local anesthetic, under direct ultrasound visualization, a 12 gauge spring-loaded device was used to perform biopsy of the 1.9 cm mass at the 1 o'clock position using a medial to lateral approach. Three tissue specimens were obtained. At the conclusion of the procedure a coil shaped tissue marker clip was deployed into the biopsy cavity. Follow up 2 view mammogram was performed and dictated separately. IMPRESSION: Ultrasound guided biopsy of 2 suspicious masses within the right breast at the 9 o'clock and 1 o'clock positions. No apparent complications. Electronically Signed: By: Marin Olp M.D. On: 08/02/2018 15:48    ELIGIBLE FOR AVAILABLE RESEARCH PROTOCOL: no  ASSESSMENT: 68 y.o. [place], [place] woman status post right breast biopsy 08/02/2018 for a clinical T3 N0, stage IIA invasive lobular carcinoma, grade 2, estrogen receptor strongly positive, progesterone receptor 1% positive, with no HER-2 amplification and an MIB-1 of 1%.  (1) definitive surgery pending  (2) Oncotype or MammaPrint to be obtained from the definitive surgical sample  (3) adjuvant radiation to follow  (4) anastrozole started neoadjuvantly (on 08/14/2018) in  anticipation of possible surgical delays  PLAN: I spent approximately 60 minutes face to face with Doris Smith with more than 50% of that time spent in counseling and coordination of care. Specifically we reviewed the biology of the patient's diagnosis and the specifics of her situation. We first reviewed the fact that cancer is not one disease but more than 100 different diseases and that it is important to keep them separate-- otherwise when friends and relatives discuss their own cancer experiences with Doris Smith confusion can result. Similarly we explained that if breast cancer spreads to the bone or liver, the patient would not have bone cancer or liver cancer, but breast cancer in the bone and breast cancer in the liver: one cancer in three places-- not 3 different cancers which otherwise would have to be treated in 3 different ways.  We discussed the difference between local and systemic therapy. In terms of loco-regional treatment, lumpectomy plus radiation is equivalent to mastectomy as far as survival is concerned. For this reason we generally recommend breast conserving surgery.  However in her case we do not  believe it will be possible to "save the breast", and so mastectomy is recommended.  She will discuss reconstruction options with her surgeon.    We then discussed the rationale for systemic therapy. There is some risk that this cancer may have already spread to other parts of her body.  Going to obtain staging studies in this case, and I do expect them to be "negative".  She understands this does not mean there is not occult, microscopic cancer elsewhere in her body only that she does not have stage IV disease, which of course would change the treatment plan  Next we went over the options for systemic therapy which are anti-estrogens, anti-HER-2 immunotherapy, and chemotherapy. Melady does not meet criteria for anti-HER-2 immunotherapy. She is a good candidate for anti-estrogens.  The question of  chemotherapy is more complicated. Chemotherapy is most effective in rapidly growing, aggressive tumors. It is much less effective in not high-grade, slow growing cancers, like Aerianna 's. For that reason we are going to request an Oncotype from the definitive surgical sample, as suggested by NCCN guidelines. That will help [place] make a definitive decision regarding chemotherapy in this case.  The patient does not meet criteria for genetics testing but I have asked her to check with her family and if any further relatives with a related cancer emerges then we would proceed to genetics testing.  Because she is interested in reconstruction and she needs an MRI, meeting with the plastic surgeon, and then further coordination of care, there may be a 6 to 8-week delay in surgery.  Accordingly our thought is that she will benefit from starting anastrozole now.  We discussed the possible toxicity side effects and complications of this agent and she is eager to start.  I went ahead and placed the prescription and she will let me know if she has any unusual side effects from this medication  Jaquelyn has a good understanding of the overall plan. She agrees with it. She knows the goal of treatment in her case is cure. She will call with any problems that may develop before her next visit here.  Chauncey Cruel, MD   08/14/2018 11:23 AM Medical Oncology and Hematology [facility] [address] [place], [place] [address] Tel. [phone]    Fax. [phone]  This document serves as a record of services personally performed by Lurline Del, MD. It was created on his behalf by Wilburn Mylar, a trained medical scribe. The creation of this record is based on the scribe's personal observations and the provider's statements to them.   I, Lurline Del MD, have reviewed the above documentation for accuracy and completeness, and I agree with the above.

## 2018-08-14 ENCOUNTER — Encounter: Payer: Self-pay | Admitting: Oncology

## 2018-08-14 ENCOUNTER — Other Ambulatory Visit: Payer: Self-pay

## 2018-08-14 ENCOUNTER — Encounter: Payer: Self-pay | Admitting: *Deleted

## 2018-08-14 ENCOUNTER — Ambulatory Visit: Payer: BLUE CROSS/BLUE SHIELD | Attending: Surgery | Admitting: Physical Therapy

## 2018-08-14 ENCOUNTER — Encounter: Payer: Self-pay | Admitting: Physical Therapy

## 2018-08-14 ENCOUNTER — Ambulatory Visit
Admission: RE | Admit: 2018-08-14 | Discharge: 2018-08-14 | Disposition: A | Discharge status: Home / Self Care | Payer: Medicare Other | Source: Ambulatory Visit | Attending: Radiation Oncology | Admitting: Radiation Oncology

## 2018-08-14 ENCOUNTER — Inpatient Hospital Stay (HOSPITAL_BASED_OUTPATIENT_CLINIC_OR_DEPARTMENT_OTHER): Payer: BLUE CROSS/BLUE SHIELD | Admitting: Oncology

## 2018-08-14 ENCOUNTER — Inpatient Hospital Stay: Payer: BLUE CROSS/BLUE SHIELD

## 2018-08-14 VITALS — BP 130/52 | HR 65 | Temp 97.8°F | Resp 18 | Ht 65.0 in | Wt 206.9 lb

## 2018-08-14 DIAGNOSIS — C50411 Malignant neoplasm of upper-outer quadrant of right female breast: Secondary | ICD-10-CM

## 2018-08-14 DIAGNOSIS — C50211 Malignant neoplasm of upper-inner quadrant of right female breast: Secondary | ICD-10-CM | POA: Diagnosis not present

## 2018-08-14 DIAGNOSIS — R262 Difficulty in walking, not elsewhere classified: Secondary | ICD-10-CM | POA: Diagnosis not present

## 2018-08-14 DIAGNOSIS — R293 Abnormal posture: Secondary | ICD-10-CM | POA: Diagnosis not present

## 2018-08-14 DIAGNOSIS — Z803 Family history of malignant neoplasm of breast: Secondary | ICD-10-CM | POA: Diagnosis not present

## 2018-08-14 DIAGNOSIS — Z170 Estrogen receptor positive status [ER+]: Secondary | ICD-10-CM

## 2018-08-14 DIAGNOSIS — C50911 Malignant neoplasm of unspecified site of right female breast: Secondary | ICD-10-CM | POA: Diagnosis not present

## 2018-08-14 LAB — CMP (CANCER CENTER ONLY)
ALT: 10 U/L (ref 0–44)
AST: 14 U/L — ABNORMAL LOW (ref 15–41)
Albumin: 3.6 g/dL (ref 3.5–5.0)
Alkaline Phosphatase: 97 U/L (ref 38–126)
Anion gap: 7 (ref 5–15)
BUN: 11 mg/dL (ref 8–23)
CO2: 28 mmol/L (ref 22–32)
Calcium: 8.7 mg/dL — ABNORMAL LOW (ref 8.9–10.3)
Chloride: 108 mmol/L (ref 98–111)
Creatinine: 0.83 mg/dL (ref 0.44–1.00)
GFR, Est AFR Am: >60 mL/min (ref >60)
GFR, Estimated: >60 mL/min (ref >60)
Glucose, Bld: 87 mg/dL (ref 70–99)
Potassium: 4.1 mmol/L (ref 3.5–5.1)
Sodium: 143 mmol/L (ref 135–145)
Total Bilirubin: 0.5 mg/dL (ref 0.3–1.2)
Total Protein: 6.6 g/dL (ref 6.5–8.1)

## 2018-08-14 LAB — CBC WITH DIFFERENTIAL (CANCER CENTER ONLY)
Abs Immature Granulocytes: 0.02 10*3/uL (ref 0.00–0.07)
Basophils Absolute: 0.1 10*3/uL (ref 0.0–0.1)
Basophils Relative: 1 %
Eosinophils Absolute: 0.1 10*3/uL (ref 0.0–0.5)
Eosinophils Relative: 1 %
HCT: 39.5 % (ref 36.0–46.0)
Hemoglobin: 12.7 g/dL (ref 12.0–15.0)
Immature Granulocytes: 0 %
Lymphocytes Relative: 17 %
Lymphs Abs: 1.1 10*3/uL (ref 0.7–4.0)
MCH: 28.9 pg (ref 26.0–34.0)
MCHC: 32.2 g/dL (ref 30.0–36.0)
MCV: 90 fL (ref 80.0–100.0)
Monocytes Absolute: 0.8 10*3/uL (ref 0.1–1.0)
Monocytes Relative: 12 %
Neutro Abs: 4.4 10*3/uL (ref 1.7–7.7)
Neutrophils Relative %: 69 %
Platelet Count: 242 10*3/uL (ref 150–400)
RBC: 4.39 MIL/uL (ref 3.87–5.11)
RDW: 13.2 % (ref 11.5–15.5)
WBC Count: 6.4 10*3/uL (ref 4.0–10.5)
nRBC: 0 % (ref 0.0–0.2)

## 2018-08-14 MED ORDER — ANASTROZOLE 1 MG PO TABS: 1.0000 mg | ORAL_TABLET | Freq: Every day | ORAL | 4 refills | Status: DC

## 2018-08-14 NOTE — Progress Notes (Signed)
Radiation Oncology         [phone] ________________________________  Name: Doris Smith        MRN: [id number]  Date of Service: 08/14/2018 DOB: 08/09/1950  CC:Doris Smith, [name], FNP  Doris Smith, Doris Smith, *     REFERRING PHYSICIAN: Martin, Doris Smith, *   DIAGNOSIS: The primary encounter diagnosis was Malignant neoplasm of upper-outer quadrant of right breast in female, estrogen receptor positive (HCC). A diagnosis of Malignant neoplasm of upper-inner quadrant of right breast in female, estrogen receptor positive (HCC) was also pertinent to this visit. Stage T2, N0, M0, Right Breast, UIQ, Invasive Lobular Carcinoma, ER (+), PR (+), HER2 (neg), grade 2-3   HISTORY OF PRESENT ILLNESS: Doris Smith is a 67 y.o. female seen in the multidisciplinary breast clinic for a new diagnosis of right breast cancer. The patient was noted to have right breast firmness and nipple inversion x 2 months. She also reports a family hx of her materal aunt with breast cancer.   She underwent bilateral diagnostic mammography with tomography and right breast ultrasonography on 07/31/2018 showing: Findings compatible with multicentric breast cancer spanning at least the upper inner and upper outer quadrants of the right breast and retroareolar regions. No evidence of malignancy in the left breast and no adenopathy.  Accordingly on 08/02/2018 she proceeded to biopsy of the right breast area in question. The pathology from this procedure showed: 1. Breast, right, needle core biopsy, 9 o'clock, 6cmfn with invasive mammary carcinoma, grade 2-3. 2. Breast, right, needle core biopsy, 1 o'clock, 3cmfn with invasive mammary carcinoma, grade 2-3. Prognostic indicators significant for: ER, 90% positive and PR, 1% positive, both with strong staining intensity. Proliferation marker Ki67 at 1%. HER2 negative.    PREVIOUS RADIATION THERAPY: No   PAST MEDICAL HISTORY:  Past Medical History:  Diagnosis Date    . A-fib (HCC) 11/2012  . Anxiety    takes Ativan  . Asthma 04/07/2011   dx  . Depression   . Fatty liver   . Fibromyalgia   . Fracture, ankle 10/2008   right and left  . GERD (gastroesophageal reflux disease)   . Headache(784.0)    migraines  . Irritable bowel syndrome   . Mental disorder    dx bipolar  . PONV (postoperative nausea and vomiting)   . Sleep apnea 04/2011    No CPAP       PAST SURGICAL HISTORY: Past Surgical History:  Procedure Laterality Date  . DILATION AND CURETTAGE OF UTERUS  1981   abnormal pap  . KNEE ARTHROSCOPY Left 2007   x 2  . LUMBAR LAMINECTOMY/DECOMPRESSION MICRODISCECTOMY  05/31/2011   Procedure: LUMBAR LAMINECTOMY/DECOMPRESSION MICRODISCECTOMY;  Surgeon: Jeffrey C Beane;  Location: [facility];  Service: Orthopedics;  Laterality: Left;  Decompression Lumbar four to five and  Lumbar five to Sacral one on Left  (X-Ray)  . PARTIAL HYSTERECTOMY  1982  . TONSILLECTOMY     as child  . TOTAL KNEE ARTHROPLASTY Left 12/18/2005     FAMILY HISTORY:  Family History  Problem Relation Age of Onset  . Anesthesia problems Mother   . Heart disease Mother        CHF, atrial fib  . Heart failure Mother   . Anesthesia problems Sister   . Colon polyps Father   . Heart disease Father        "Fluid around the heart"  . Hypertension Sister   . Breast cancer Maternal Aunt   . Colon cancer Maternal Aunt   .   Prostate cancer Neg Hx   . Ovarian cancer Neg Hx      SOCIAL HISTORY:  reports that she has never smoked. She has never used smokeless tobacco. She reports that she does not drink alcohol or use drugs.   ALLERGIES: Iohexol; Codeine; Prednisone; Sulfonamide derivatives; Celecoxib; Sulfa antibiotics; and Vitamin b12   MEDICATIONS:  Current Outpatient Medications  Medication Sig Dispense Refill  . anastrozole (ARIMIDEX) 1 MG tablet Take 1 tablet (1 mg total) by mouth daily. 90 tablet 4  . benzonatate (TESSALON PERLES) 100 MG capsule Take 1 capsule  (100 mg total) by mouth 3 (three) times daily as needed for cough. 20 capsule 0  . clotrimazole-betamethasone (LOTRISONE) cream Apply 1 application topically 2 (two) times daily. 30 g 0  . esomeprazole (NEXIUM) 40 MG capsule One daily 30 minutes before breakfast 30 capsule 1  . FLUoxetine (PROZAC) 20 MG capsule Take 1 capsule by mouth 2 (two) times daily.    . gabapentin (NEURONTIN) 300 MG capsule Take 1 capsule (300 mg total) by mouth 3 (three) times daily. 90 capsule 5  . lamoTRIgine (LAMICTAL) 200 MG tablet Take 100 mg by mouth 2 (two) times daily.    . LORazepam (ATIVAN) 0.5 MG tablet Take 1 tablet (0.5 mg total) by mouth 2 (two) times daily as needed. 30 tablet 2  . montelukast (SINGULAIR) 10 MG tablet Take 10 mg by mouth at bedtime.    . traZODone (DESYREL) 50 MG tablet Take 50 mg by mouth at bedtime. 1/2 tablet at night as needed     No current facility-administered medications for this encounter.      REVIEW OF SYSTEMS: On review of systems, the patient reports that she is doing well overall. She denies any chest pain, shortness of breath, cough, fevers, chills, night sweats, unintended weight changes. She denies any bowel or bladder disturbances, and denies abdominal pain, nausea or vomiting. She denies any new musculoskeletal or joint aches or pains. A complete review of systems is obtained and is otherwise negative.     PHYSICAL EXAM:  Wt Readings from Last 3 Encounters:  08/14/18 206 lb 14.4 oz (93.8 kg)  07/11/18 212 lb (96.2 kg)  05/03/18 207 lb (93.9 kg)   Temp Readings from Last 3 Encounters:  08/14/18 97.8 F (36.6 C) (Oral)  07/11/18 (!) 97.3 F (36.3 C) (Oral)  05/03/18 (!) 97.2 F (36.2 C) (Oral)   BP Readings from Last 3 Encounters:  08/14/18 (!) 130/52  07/11/18 (!) 106/48  05/03/18 128/61   Pulse Readings from Last 3 Encounters:  08/14/18 65  07/11/18 76  05/03/18 64     In general this is a well appearing Caucasian female in no acute distress. She  is alert and oriented x4 and appropriate throughout the examination. HEENT reveals that the patient is normocephalic, atraumatic. EOMs are intact. Skin is intact without any evidence of gross lesions. Cardiovascular exam reveals a regular rate and rhythm, no clicks rubs or murmurs are auscultated. Chest is clear to auscultation bilaterally. Lymphatic assessment is performed and does not reveal any adenopathy in the cervical, supraclavicular, axillary, or inguinal chains. Bilateral breast exam is performed and reveals bruising noted to RUIQ with underlying mass that extends into multiple quadrants. No palpable masses to her bilateral axillas. Abdomen has active bowel sounds in all quadrants and is intact. The abdomen is soft, non tender, non distended. Lower extremities are negative for pretibial pitting edema, deep calf tenderness, cyanosis or clubbing.   ECOG = 0    0 - Asymptomatic (Fully active, able to carry on all predisease activities without restriction)  1 - Symptomatic but completely ambulatory (Restricted in physically strenuous activity but ambulatory and able to carry out work of a light or sedentary nature. For example, light housework, office work)  2 - Symptomatic, <50% in bed during the day (Ambulatory and capable of all self care but unable to carry out any work activities. Up and about more than 50% of waking hours)  3 - Symptomatic, >50% in bed, but not bedbound (Capable of only limited self-care, confined to bed or chair 50% or more of waking hours)  4 - Bedbound (Completely disabled. Cannot carry on any self-care. Totally confined to bed or chair)  5 - Death   Oken MM, Creech RH, Tormey DC, et al. (1982). "Toxicity and response criteria of the Eastern Cooperative Oncology Group". Am. J. Clin. Oncol. 5 (6): 649-55    LABORATORY DATA:  Lab Results  Component Value Date   WBC 6.4 08/14/2018   HGB 12.7 08/14/2018   HCT 39.5 08/14/2018   MCV 90.0 08/14/2018   PLT 242  08/14/2018   Lab Results  Component Value Date   NA 143 08/14/2018   K 4.1 08/14/2018   CL 108 08/14/2018   CO2 28 08/14/2018   Lab Results  Component Value Date   ALT 10 08/14/2018   AST 14 (L) 08/14/2018   ALKPHOS 97 08/14/2018   BILITOT 0.5 08/14/2018      RADIOGRAPHY: Us Breast Ltd Uni Right Inc Axilla  Result Date: 07/31/2018 CLINICAL DATA:  Right breast firmness and nipple inversion for the past 2 months. Family history of breast cancer a maternal aunt. EXAM: DIGITAL DIAGNOSTIC BILATERAL MAMMOGRAM WITH CAD AND TOMO ULTRASOUND RIGHT BREAST COMPARISON:  Previous exam(s). ACR Breast Density Category c: The breast tissue is heterogeneously dense, which may obscure small masses. FINDINGS: Interval diffuse distortion throughout heterogeneously dense glandular tissue in the right breast involving all 4 quadrants. This spans an area measuring approximately 9.7 x 9 3 x 5.0 cm. No skin thickening is seen. The left breast is unremarkable. There are no visible enlarged lymph nodes. Mammographic images were processed with CAD. On physical exam, there is soft tissue firmness throughout the upper right breast. There is also an approximately 1.5 cm palpable mass-like area in the 9 o'clock position, 6 cm from the nipple. There are no palpable right axillary lymph nodes. Targeted ultrasound is performed, showing poorly defined, irregular, mass-like area is throughout the upper inner right breast and upper outer right breast extending from approximately the 2:00 to 9:00 positions. These areas are associated with posterior acoustical shadowing. One of these corresponds to the palpable mass-like area in the 9 o'clock position, 6 cm from the nipple. This is an irregular, hypoechoic area with indistinct margins measuring 2.7 x 2.3 x 2.2 cm. In the 1 o'clock position, 3 cm from the nipple, there is a 1.9 x 1.0 x 0.8 cm hypoechoic mass with poorly defined margins. At real time, this has an irregular appearance and  there is associated posterior acoustical shadowing. There are also multiple additional poorly defined, irregular, hypoechoic masses with associated posterior acoustical shadowing in the 12 o'clock retroareolar region of the breast. Ultrasound of the right axilla demonstrated normal appearing right axillary lymph nodes. IMPRESSION: 1. Findings compatible with multicentric breast cancer spanning at least the upper inner and upper outer quadrants of the right breast and retroareolar regions. 2. No evidence of malignancy in the left breast and   no adenopathy. RECOMMENDATION: Ultrasound-guided core needle biopsies of the 2.7 cm mass in the 9 o'clock position of the right breast and 1.9 cm mass in the 1 o'clock position of the right breast. This has been discussed with the patient and scheduled at 2:45 p.m. 08/02/2018. I have discussed the findings and recommendations with the patient. Results were also provided in writing at the conclusion of the visit. If applicable, a reminder letter will be sent to the patient regarding the next appointment. BI-RADS CATEGORY  5: Highly suggestive of malignancy. Electronically Signed   By: Steven  Reid M.D.   On: 07/31/2018 09:46   Mm Diag Breast Tomo Bilateral  Result Date: 07/31/2018 CLINICAL DATA:  Right breast firmness and nipple inversion for the past 2 months. Family history of breast cancer a maternal aunt. EXAM: DIGITAL DIAGNOSTIC BILATERAL MAMMOGRAM WITH CAD AND TOMO ULTRASOUND RIGHT BREAST COMPARISON:  Previous exam(s). ACR Breast Density Category c: The breast tissue is heterogeneously dense, which may obscure small masses. FINDINGS: Interval diffuse distortion throughout heterogeneously dense glandular tissue in the right breast involving all 4 quadrants. This spans an area measuring approximately 9.7 x 9 3 x 5.0 cm. No skin thickening is seen. The left breast is unremarkable. There are no visible enlarged lymph nodes. Mammographic images were processed with CAD. On  physical exam, there is soft tissue firmness throughout the upper right breast. There is also an approximately 1.5 cm palpable mass-like area in the 9 o'clock position, 6 cm from the nipple. There are no palpable right axillary lymph nodes. Targeted ultrasound is performed, showing poorly defined, irregular, mass-like area is throughout the upper inner right breast and upper outer right breast extending from approximately the 2:00 to 9:00 positions. These areas are associated with posterior acoustical shadowing. One of these corresponds to the palpable mass-like area in the 9 o'clock position, 6 cm from the nipple. This is an irregular, hypoechoic area with indistinct margins measuring 2.7 x 2.3 x 2.2 cm. In the 1 o'clock position, 3 cm from the nipple, there is a 1.9 x 1.0 x 0.8 cm hypoechoic mass with poorly defined margins. At real time, this has an irregular appearance and there is associated posterior acoustical shadowing. There are also multiple additional poorly defined, irregular, hypoechoic masses with associated posterior acoustical shadowing in the 12 o'clock retroareolar region of the breast. Ultrasound of the right axilla demonstrated normal appearing right axillary lymph nodes. IMPRESSION: 1. Findings compatible with multicentric breast cancer spanning at least the upper inner and upper outer quadrants of the right breast and retroareolar regions. 2. No evidence of malignancy in the left breast and no adenopathy. RECOMMENDATION: Ultrasound-guided core needle biopsies of the 2.7 cm mass in the 9 o'clock position of the right breast and 1.9 cm mass in the 1 o'clock position of the right breast. This has been discussed with the patient and scheduled at 2:45 p.m. 08/02/2018. I have discussed the findings and recommendations with the patient. Results were also provided in writing at the conclusion of the visit. If applicable, a reminder letter will be sent to the patient regarding the next appointment.  BI-RADS CATEGORY  5: Highly suggestive of malignancy. Electronically Signed   By: Steven  Reid M.D.   On: 07/31/2018 09:46   Mm Clip Placement Right  Result Date: 08/02/2018 CLINICAL DATA:  Patient is post ultrasound-guided core needle biopsy of irregular hypoechoic masses at the 9 o'clock position 6 cm from the nipple and 1 o'clock position 3 cm from the nipple   right breast. EXAM: DIAGNOSTIC RIGHT MAMMOGRAM POST ULTRASOUND BIOPSY COMPARISON:  Previous exam(s). FINDINGS: Mammographic images were obtained following ultrasound guided biopsy of the targeted masses at the 9 o'clock position and 1 o'clock position. Images demonstrates satisfactory placement of a ribbon shaped metallic clip over the biopsied mass at the 9 o'clock position and satisfactory placement of a coil shaped clip over the biopsied mass at the 1 o'clock position. IMPRESSION: Satisfactory clip placement post ultrasound-guided core needle biopsy 2 right breast masses as described. Final Assessment: Post Procedure Mammograms for Marker Placement Electronically Signed   By: Marin Olp M.D.   On: 08/02/2018 15:57   [place] Rt Breast Bx W Loc Dev 1st Lesion Img Bx Spec US Guide  Addendum Date: 08/06/2018   ADDENDUM REPORT: 08/06/2018 06:41 ADDENDUM: Pathology revealed GRADE II-III INVASIVE MAMMARY CARCINOMA of the Right breast, both locations, 9 o'clock, 6 cmfn and 1 o'clock, 3 cmfn. This was found to be concordant by Dr. Marin Olp. Pathology results were discussed with the patient by telephone. The patient reported doing well after the biopsies with tenderness at the sites. Post biopsy instructions and care were reviewed and questions were answered. The patient was encouraged to call [facility] for any additional concerns. The patient was referred to [facility] on August 14, 2018. Pathology results reported by Terie Purser, RN on 08/06/2018.  Electronically Signed   By: Marin Olp M.D.   On: 08/06/2018 06:41   Result Date: 08/06/2018 CLINICAL DATA:  Patient presents for ultrasound-guided core needle biopsy of 2 sites in the right breast. Recent diagnostic imaging demonstrates diffuse distortion and multiple irregular shadowing hypoechoic areas throughout the upper half of the right breast and retroareolar region. Biopsy will target masses at the 9 o'clock and 1 o'clock positions. EXAM: ULTRASOUND GUIDED RIGHT BREAST CORE NEEDLE BIOPSY COMPARISON:  Previous exam(s). FINDINGS: I met with the patient and we discussed the procedure of ultrasound-guided biopsy, including benefits and alternatives. We discussed the high likelihood of a successful procedure. We discussed the risks of the procedure, including infection, bleeding, tissue injury, clip migration, and inadequate sampling. Informed written consent was given. The usual time-out protocol was performed immediately prior to the procedure. Lesion quadrant: Right upper outer quadrant (9-9:30 position). Using sterile technique and 1% Lidocaine as local anesthetic, under direct ultrasound visualization, a 12 gauge spring-loaded device was used to perform biopsy of the targeted 2.7 cm mass at the 9 o'clock position using a inferior to superior approach. Three adequate tissue specimens were obtained. At the conclusion of the procedure a ribbon shaped tissue marker clip was deployed into the biopsy cavity. Follow up 2 view mammogram was performed and dictated separately. Lesion quadrant: Right upper inner quadrant (1 o'clock position). Using sterile technique and 1% Lidocaine as local anesthetic, under direct ultrasound visualization, a 12 gauge spring-loaded device was used to perform biopsy of the 1.9 cm mass at the 1 o'clock position using a medial to lateral approach. Three tissue specimens were obtained. At the conclusion of the procedure a coil shaped tissue marker clip was deployed into the biopsy  cavity. Follow up 2 view mammogram was performed and dictated separately. IMPRESSION: Ultrasound guided biopsy of 2 suspicious masses within the right breast at the 9 o'clock and 1 o'clock positions. No apparent complications. Electronically Signed: By: Marin Olp M.D. On: 08/02/2018 15:48   [place] Rt Breast Bx W Loc Dev Ea Add Lesion Img  Bx Spec US Guide  Addendum Date: 08/06/2018   ADDENDUM REPORT: 08/06/2018 06:41 ADDENDUM: Pathology revealed GRADE II-III INVASIVE MAMMARY CARCINOMA of the Right breast, both locations, 9 o'clock, 6 cmfn and 1 o'clock, 3 cmfn. This was found to be concordant by Dr. Marin Olp. Pathology results were discussed with the patient by telephone. The patient reported doing well after the biopsies with tenderness at the sites. Post biopsy instructions and care were reviewed and questions were answered. The patient was encouraged to call [facility] for any additional concerns. The patient was referred to [facility] on August 14, 2018. Pathology results reported by Terie Purser, RN on 08/06/2018. Electronically Signed   By: Marin Olp M.D.   On: 08/06/2018 06:41   Result Date: 08/06/2018 CLINICAL DATA:  Patient presents for ultrasound-guided core needle biopsy of 2 sites in the right breast. Recent diagnostic imaging demonstrates diffuse distortion and multiple irregular shadowing hypoechoic areas throughout the upper half of the right breast and retroareolar region. Biopsy will target masses at the 9 o'clock and 1 o'clock positions. EXAM: ULTRASOUND GUIDED RIGHT BREAST CORE NEEDLE BIOPSY COMPARISON:  Previous exam(s). FINDINGS: I met with the patient and we discussed the procedure of ultrasound-guided biopsy, including benefits and alternatives. We discussed the high likelihood of a successful procedure. We discussed the risks of the procedure, including infection, bleeding,  tissue injury, clip migration, and inadequate sampling. Informed written consent was given. The usual time-out protocol was performed immediately prior to the procedure. Lesion quadrant: Right upper outer quadrant (9-9:30 position). Using sterile technique and 1% Lidocaine as local anesthetic, under direct ultrasound visualization, a 12 gauge spring-loaded device was used to perform biopsy of the targeted 2.7 cm mass at the 9 o'clock position using a inferior to superior approach. Three adequate tissue specimens were obtained. At the conclusion of the procedure a ribbon shaped tissue marker clip was deployed into the biopsy cavity. Follow up 2 view mammogram was performed and dictated separately. Lesion quadrant: Right upper inner quadrant (1 o'clock position). Using sterile technique and 1% Lidocaine as local anesthetic, under direct ultrasound visualization, a 12 gauge spring-loaded device was used to perform biopsy of the 1.9 cm mass at the 1 o'clock position using a medial to lateral approach. Three tissue specimens were obtained. At the conclusion of the procedure a coil shaped tissue marker clip was deployed into the biopsy cavity. Follow up 2 view mammogram was performed and dictated separately. IMPRESSION: Ultrasound guided biopsy of 2 suspicious masses within the right breast at the 9 o'clock and 1 o'clock positions. No apparent complications. Electronically Signed: By: Marin Olp M.D. On: 08/02/2018 15:48       IMPRESSION/PLAN: 1. Stage T2, N0, M0, Right Breast, UIQ, Invasive Lobular Carcinoma, ER (+), PR (+), HER2 (neg), grade 2-3  Per Dr. Virgie Dad recommendations, patient will have staging studies completed prior to repeat surgery. Staging studies such as bone scan, CT chest, MRI, and an oncotype Dx to be completed prior too. Per Dr. Pollie Friar recommendations, the patient will undergo right mastectomy with sentinel node biopsy. Discussed that radiation is a standard recommendation for this  circumstance, discussed the risks if radiation isn't completed as well with the patient. Patient advised that the risks are but not limited to, skin irritation and mild fatigue towards the end of radiation treatments. Discussed with the patient that any and all side effects from radiation will be monitored and that  the patient will be given a cream during radiation treatment to help with skin irritation. Advised that the skin irritation will occur 2-3 weeks into radiation treatment and that we will monitor it as needed. Patient will have post-mastectomy radiation completed 4 weeks s/p mastectomy to the mastectomy site with an appointment prior to simulate the patient. She will receive 4 weeks of radiation to the right chest wall to prevent locoregional recurrence.   She will be started on hormonal therapy following completion of radiation therapy with Dr. Magrinat. Advised use of annual mammograms to left breast after completion of treatment and stressed the importance of this.   Patient will follow up in the office after her surgery to be scheduled for CT simulation with treatments starting shortly afterwards. She knows to call with any questions or concerns.      ------------------------------------------------   S. , MD, PhD   This document serves as a record of services personally performed by  , MD. It was created on his behalf by Soijett Blue, a trained medical scribe. The creation of this record is based on the scribe's personal observations and the provider's statements to them. This document has been checked and approved by the attending provider.  

## 2018-08-14 NOTE — Progress Notes (Signed)
Clinical Social Work [facility] Psychosocial Distress Screening [facility]  Patient completed distress screening protocol and scored a 9 on the Psychosocial Distress Thermometer which indicates severe distress. Clinical Social Worker met with patient in [facility] to assess for distress and other psychosocial needs. Patient stated she was feeling overwhelmed but felt "better" after meeting with the treatment team and getting more information on her treatment plan.  Patient stated she had "lots of support" in her family and friends, and identified her faith as support.  CSW and patient discussed common feeling and emotions when being diagnosed with cancer, and the importance of support during treatment. CSW informed patient of the support team and support services at [facility]. CSW provided contact information and encouraged patient to call with any questions or concerns.  ONCBCN DISTRESS SCREENING 08/14/2018  Screening Type Initial Screening  Distress experienced in past week (1-10) 9  Emotional problem type Depression;Nervousness/Anxiety;Boredom  Physical Problem type Pain;Sleep/insomnia;Getting around;Breathing  Physician notified of physical symptoms Yes      , MSW, LCSW, OSW-C Clinical Social Worker [facility] [phone]       

## 2018-08-14 NOTE — Progress Notes (Signed)
Nutrition Assessment  Reason for Assessment:  Pt seen in Breast Clinic  ASSESSMENT:   68 year old female with new diagnosis of breast cancer.  Past medical history reviewed.  Patient reports normal appetite.  Medications:  reviewed  Labs: reviewed  Anthropometrics:   Height: 65 inches Weight: 206 lb 14.4 oz BMI: 34   NUTRITION DIAGNOSIS: Food and nutrition related knowledge deficit related to new diagnosis of breast cancer as evidenced by no prior need for nutrition related information.  INTERVENTION:   Discussed and provided packet of information regarding nutritional tips for breast cancer patients.  Questions answered.  Teachback method used.  Contact information provided and patient knows to contact me with questions/concerns.    MONITORING, EVALUATION, and GOAL: Pt will consume a healthy plant based diet to maintain lean body mass throughout treatment.   Rebecka Oelkers B. Zenia Resides, [place], [place] Registered Dietitian [phone] (pager)

## 2018-08-14 NOTE — Therapy (Signed)
[place], [place], [address] Phone: [phone]   Fax:  [phone]  Physical Therapy Evaluation  Patient Details  Name: Doris Smith MRN: [id number] Date of Birth: 1951-07-09 Referring Provider (PT): Dr. Alphonsa Overall   Encounter Date: 08/14/2018  PT End of Session - 08/14/18 1040    Visit Number  1    Number of Visits  2    Date for PT Re-Evaluation  10/09/18    PT Start Time  0940    PT Stop Time  0951   Also saw pt from 1006-1025 for a total of 30 minutes   PT Time Calculation (min)  11 min    Activity Tolerance  Patient tolerated treatment well    Behavior During Therapy  [facility] for tasks assessed/performed       Past Medical History:  Diagnosis Date  . A-fib ([place]) 11/2012  . Anxiety    takes Ativan  . Asthma 04/07/2011   dx  . Depression   . Fatty liver   . Fibromyalgia   . Fracture, ankle 10/2008   right and left  . GERD (gastroesophageal reflux disease)   . Headache(784.0)    migraines  . Irritable bowel syndrome   . Mental disorder    dx bipolar  . PONV (postoperative nausea and vomiting)   . Sleep apnea 04/2011    No CPAP    Past Surgical History:  Procedure Laterality Date  . DILATION AND CURETTAGE OF UTERUS  1981   abnormal pap  . KNEE ARTHROSCOPY Left 2007   x 2  . LUMBAR LAMINECTOMY/DECOMPRESSION MICRODISCECTOMY  05/31/2011   Procedure: LUMBAR LAMINECTOMY/DECOMPRESSION MICRODISCECTOMY;  Surgeon: Johnn Hai;  Location: [facility];  Service: Orthopedics;  Laterality: Left;  Decompression Lumbar four to five and  Lumbar five to Sacral one on Left  (X-Ray)  . PARTIAL HYSTERECTOMY  1982  . TONSILLECTOMY     as child  . TOTAL KNEE ARTHROPLASTY Left 12/18/2005    There were no vitals filed for this visit.   Subjective Assessment - 08/14/18 1030    Subjective  Patient reports she is here today to be seen by her medical team for her newly diagnosed right breast  cancer. She also reports significant balance deficits and 3 falls over the past 6 months.    Pertinent History  Patient was diagnosed on 07/31/2018 with right grade II-III invasive lobular carcinoma breast cancer. There are several areas measuring a total of 9.7 cm in multiple quadrants. It is ER/PR positive and HER2 negative with a Ki67 of 1%. She reports unsteadiness over the past 2 years and uses a single point cane. She has a history of back surgery and a left knee replacement and is on disability for those orthopedic issues and depression per her report.    Patient Stated Goals  Reduce lymphedema risk and learn post op shoulder ROM HEP    Currently in Pain?  Yes    Pain Score  9     Pain Location  Knee    Pain Orientation  Right;Left    Pain Descriptors / Indicators  Aching    Pain Type  Chronic pain    Pain Onset  More than a month ago    Pain Frequency  Intermittent    Aggravating Factors   Walking and standing    Pain Relieving Factors  Sitting         [facility] PT Assessment - 08/14/18 0001  Assessment   Medical Diagnosis  Right breast cancer    Referring Provider (PT)  Dr. Alphonsa Overall    Onset Date/Surgical Date  07/31/18    Hand Dominance  Right    Prior Therapy  none      Precautions   Precautions  Other (comment)    Precaution Comments  active cancer; fall risk      Restrictions   Weight Bearing Restrictions  No      Balance Screen   Has the patient fallen in the past 6 months  Yes    How many times?  3    Has the patient had a decrease in activity level because of a fear of falling?   Yes    Is the patient reluctant to leave their home because of a fear of falling?   Yes   Discussed and offered PT to address balance; she declined     Home Environment   Living Environment  Private residence    Living Arrangements  Spouse/significant other;Children   Husband and adult son   Available Help at Discharge  Family      Prior Function   Level of Independence   Requires assistive device for independence    Vocation  On disability    Leisure  She does not exercise but reports going for a 10 min walk daily      Cognition   Overall Cognitive Status  Within Functional Limits for tasks assessed      Posture/Postural Control   Posture/Postural Control  Postural limitations    Postural Limitations  Rounded Shoulders;Forward head;Decreased lumbar lordosis      ROM / Strength   AROM / PROM / Strength  AROM;Strength      AROM   AROM Assessment Site  Shoulder;Cervical    Right/Left Shoulder  Right;Left    Right Shoulder Extension  47 Degrees    Right Shoulder Flexion  144 Degrees    Right Shoulder ABduction  145 Degrees    Right Shoulder Internal Rotation  51 Degrees    Right Shoulder External Rotation  60 Degrees    Left Shoulder Extension  46 Degrees    Left Shoulder Flexion  138 Degrees    Left Shoulder ABduction  155 Degrees    Left Shoulder Internal Rotation  62 Degrees    Left Shoulder External Rotation  44 Degrees    Cervical Flexion  WNL    Cervical Extension  25% limited    Cervical - Right Side Bend  WNL    Cervical - Left Side Bend  WNL    Cervical - Right Rotation  25% limited    Cervical - Left Rotation  25% limited      Strength   Overall Strength  Within functional limits for tasks performed        LYMPHEDEMA/ONCOLOGY QUESTIONNAIRE - 08/14/18 1037      Type   Cancer Type  Right breast cancer      Lymphedema Assessments   Lymphedema Assessments  Upper extremities      Right Upper Extremity Lymphedema   10 cm Proximal to Olecranon Process  36.6 cm    Olecranon Process  26.9 cm    10 cm Proximal to Ulnar Styloid Process  23.9 cm    Just Proximal to Ulnar Styloid Process  15.9 cm    Across Hand at [employer]  19 cm    At [place] of 2nd Digit  6.6 cm  Left Upper Extremity Lymphedema   10 cm Proximal to Olecranon Process  35.2 cm    Olecranon Process  27.1 cm    10 cm Proximal to Ulnar Styloid Process  22.6 cm     Just Proximal to Ulnar Styloid Process  15.1 cm    Across Hand at [employer]  18.8 cm    At [place] of 2nd Digit  6.2 cm             Objective measurements completed on examination: See above findings.         Patient was instructed today in a home exercise program today for post op shoulder range of motion. These included active assist shoulder flexion in sitting, scapular retraction, wall walking with shoulder abduction, and hands behind head external rotation.  She was encouraged to do these twice a day, holding 3 seconds and repeating 5 times when permitted by her physician.         PT Education - 08/14/18 1039    Education Details  Lymphedema risk reduction and post op shoulder ROM HEP    Person(s) Educated  Patient    Methods  Explanation;Demonstration;Handout    Comprehension  Returned demonstration;Verbalized understanding          PT Long Term Goals - 08/14/18 1047      PT LONG TERM GOAL #1   Title  Patient will demonstrate she has regained full shoulder ROM and function post operatively compared to baseline assessment.    Time  8    Period  Weeks    Status  New      Breast Clinic Goals - 08/14/18 1047      Patient will be able to verbalize understanding of pertinent lymphedema risk reduction practices relevant to her diagnosis specifically related to skin care.   Time  1    Period  Days    Status  Achieved      Patient will be able to return demonstrate and/or verbalize understanding of the post-op home exercise program related to regaining shoulder range of motion.   Time  1    Period  Days    Status  Achieved      Patient will be able to verbalize understanding of the importance of attending the postoperative After Breast Cancer Class for further lymphedema risk reduction education and therapeutic exercise.   Time  1    Period  Days    Status  Achieved            Plan - 08/14/18 1041    Clinical Impression Statement  Patient  was diagnosed on 07/31/2018 with right grade II-III invasive lobular carcinoma breast cancer. There are several areas measuring a total of 9.7 cm in multiple quadrants. It is ER/PR positive and HER2 negative with a Ki67 of 1%. She reports unsteadiness over the past 2 years and uses a single point cane. She has a history of back surgery and a left knee replacement and is on disability for those orthopedic issues and depression per her report. Her multidisciplinary medical team met prior to her assessments to determine a recommended treatment plan. She is planning to have an MRI to determine extent of disease followed by a right mastectomy and sentinel node biopsy followed by Oncotype testing, radiation and anti-estrogen therapy. She will benefit from a post op PT visit to reassess and determine needs. Physical therapy for balance deficitis was recommended but patient declined. She stated she may reconsider after completion of  cancer treatment.    History and Personal Factors relevant to plan of care:  Fall risk; multiple orthopedic issues; depression    Clinical Presentation  Evolving    Clinical Presentation due to:  Unknown extent of disease; multiple comorbidities    Clinical Decision Making  Moderate    Rehab Potential  Good    Clinical Impairments Affecting Rehab Potential  Comorbidities    PT Frequency  --   Eval and 1 f/u visit   PT Treatment/Interventions  ADLs/Self Care Home Management;Patient/family education;Therapeutic exercise    PT Next Visit Plan  Will reassess and determine needs post operatively    PT Home Exercise Plan  Post op shoulder ROM HEP    Consulted and Agree with Plan of Care  Patient       Patient will benefit from skilled therapeutic intervention in order to improve the following deficits and impairments:  Decreased range of motion, Impaired UE functional use, Pain, Decreased knowledge of precautions, Postural dysfunction, Dizziness, Difficulty walking, Decreased  balance  Visit Diagnosis: Malignant neoplasm of upper-outer quadrant of right breast in female, estrogen receptor positive ([place]) - Plan: PT plan of care cert/re-cert  Malignant neoplasm of upper-inner quadrant of right breast in female, estrogen receptor positive ([place]) - Plan: PT plan of care cert/re-cert  Abnormal posture - Plan: PT plan of care cert/re-cert  Difficulty in walking, not elsewhere classified - Plan: PT plan of care cert/re-cert   Patient will follow up at outpatient cancer rehab 3-4 weeks following surgery.  If the patient requires physical therapy at that time, a specific plan will be dictated and sent to the referring physician for approval. The patient was educated today on appropriate basic range of motion exercises to begin post operatively and the importance of attending the After Breast Cancer class following surgery.  Patient was educated today on lymphedema risk reduction practices as it pertains to recommendations that will benefit the patient immediately following surgery.  She verbalized good understanding.      Problem List Patient Active Problem List   Diagnosis Date Noted  . Malignant neoplasm of upper-outer quadrant of right breast in female, estrogen receptor positive ([place]) 08/08/2018  . Malignant neoplasm of upper-inner quadrant of right female breast ([place]) 08/08/2018  . Right knee pain 03/01/2015  . Aortic atherosclerosis ([address]) 12/28/2014  . Obesity (BMI 30-39.9) 04/09/2014  . Insomnia due to stress 04/09/2014  . Atrial fibrillation ([place]) 10/01/2013  . GERD (gastroesophageal reflux disease) 10/01/2013  . Abdominal pain, unspecified site 10/03/2012  . NAUSEA AND VOMITING 06/17/2008  . Bipolar disorder ([place]) 09/27/2007  . Essential hypertension 09/27/2007  . ALLERGIC RHINITIS 09/27/2007  . IRRITABLE BOWEL SYNDROME 09/27/2007  . ARTHRITIS 09/27/2007  . [place] DISEASE, LUMBOSACRAL SPINE 09/27/2007   Annia Friendly, PT 08/14/18 10:50  AM  [place], [place], [address] Phone: [phone]   Fax:  [phone]  Name: Doris Smith MRN: [id number] Date of Birth: 1951-04-16

## 2018-08-14 NOTE — Patient Instructions (Signed)

## 2018-08-19 ENCOUNTER — Ambulatory Visit (HOSPITAL_COMMUNITY)
Admission: RE | Admit: 2018-08-19 | Discharge: 2018-08-19 | Disposition: A | Discharge status: Home / Self Care | Payer: BLUE CROSS/BLUE SHIELD | Source: Ambulatory Visit | Attending: Oncology | Admitting: Oncology

## 2018-08-19 ENCOUNTER — Telehealth: Payer: Self-pay | Admitting: *Deleted

## 2018-08-19 DIAGNOSIS — C50211 Malignant neoplasm of upper-inner quadrant of right female breast: Secondary | ICD-10-CM | POA: Diagnosis not present

## 2018-08-19 DIAGNOSIS — C50911 Malignant neoplasm of unspecified site of right female breast: Secondary | ICD-10-CM | POA: Diagnosis not present

## 2018-08-19 DIAGNOSIS — Z170 Estrogen receptor positive status [ER+]: Secondary | ICD-10-CM | POA: Insufficient documentation

## 2018-08-19 DIAGNOSIS — C50411 Malignant neoplasm of upper-outer quadrant of right female breast: Secondary | ICD-10-CM | POA: Diagnosis not present

## 2018-08-19 MED ORDER — GADOBUTROL 1 MMOL/ML IV SOLN
10.0000 mL | Freq: Once | INTRAVENOUS | Status: AC | PRN
  Administered 2018-08-19: 10 mL via INTRAVENOUS

## 2018-08-19 NOTE — Telephone Encounter (Signed)
This RN was notified by Radiology of documented dye allergy and orders for CT's this Friday 2/14 with need for premeds and or change in order.  This RN contacted pt to verify not only allergy to dye but also noted reaction to prednisone.  Cindy verified dye reaction as well as " the higher the dose of steroids the worse I feel "  This note will be forwarded to Barry and MD for review and recommendations.

## 2018-08-21 ENCOUNTER — Telehealth: Payer: Self-pay | Admitting: *Deleted

## 2018-08-21 ENCOUNTER — Telehealth (HOSPITAL_COMMUNITY): Payer: Self-pay

## 2018-08-21 NOTE — Telephone Encounter (Signed)
Spoke to pt concerning [facility] from 2.5.20. Denies questions or concerns regarding dx or treatment care plan. Pt overwhelmed with reconstruction process. Discussed Dr. Iran Planas will go into great detail at her appt next week. Gave emotional support. encourage pt to call with needs. Received verbal understanding.

## 2018-08-21 NOTE — Telephone Encounter (Signed)
Nutrition  Received voicemail from patient with further nutrition related questions.    Called patient back and discussed nutrition goals/recommendations during breast cancer treatment.  Referred patient back to packet that RD gave her on 08/14/2018 in breast clinic.  She confirms that she still has packet.  Reports that she will go back over packet and if she has further questions she will call RD back.  Tareq Dwan B. Zenia Resides, [place], [place] Registered Dietitian [phone] (pager)

## 2018-08-23 ENCOUNTER — Encounter (HOSPITAL_COMMUNITY)
Admission: RE | Admit: 2018-08-23 | Discharge: 2018-08-23 | Disposition: A | Discharge status: Home / Self Care | Payer: BLUE CROSS/BLUE SHIELD | Source: Ambulatory Visit | Attending: Oncology | Admitting: Oncology

## 2018-08-23 ENCOUNTER — Encounter (HOSPITAL_COMMUNITY): Payer: Self-pay

## 2018-08-23 ENCOUNTER — Other Ambulatory Visit: Payer: Self-pay | Admitting: Oncology

## 2018-08-23 ENCOUNTER — Ambulatory Visit (HOSPITAL_COMMUNITY)
Admission: RE | Admit: 2018-08-23 | Discharge: 2018-08-23 | Disposition: A | Discharge status: Home / Self Care | Payer: BLUE CROSS/BLUE SHIELD | Source: Ambulatory Visit | Attending: Oncology | Admitting: Oncology

## 2018-08-23 DIAGNOSIS — C50211 Malignant neoplasm of upper-inner quadrant of right female breast: Secondary | ICD-10-CM

## 2018-08-23 DIAGNOSIS — C50911 Malignant neoplasm of unspecified site of right female breast: Secondary | ICD-10-CM | POA: Diagnosis not present

## 2018-08-23 DIAGNOSIS — Z170 Estrogen receptor positive status [ER+]: Secondary | ICD-10-CM | POA: Diagnosis not present

## 2018-08-23 DIAGNOSIS — C50411 Malignant neoplasm of upper-outer quadrant of right female breast: Secondary | ICD-10-CM | POA: Diagnosis not present

## 2018-08-23 DIAGNOSIS — R51 Headache: Secondary | ICD-10-CM | POA: Diagnosis not present

## 2018-08-23 MED ORDER — TECHNETIUM TC 99M MEDRONATE IV KIT
21.5000 | PACK | Freq: Once | INTRAVENOUS | Status: AC | PRN
  Administered 2018-08-23: 21.5 via INTRAVENOUS

## 2018-08-26 ENCOUNTER — Other Ambulatory Visit: Payer: Self-pay | Admitting: Oncology

## 2018-08-26 DIAGNOSIS — Z170 Estrogen receptor positive status [ER+]: Secondary | ICD-10-CM | POA: Diagnosis not present

## 2018-08-26 DIAGNOSIS — C50811 Malignant neoplasm of overlapping sites of right female breast: Secondary | ICD-10-CM | POA: Diagnosis not present

## 2018-08-26 DIAGNOSIS — M899 Disorder of bone, unspecified: Secondary | ICD-10-CM

## 2018-08-26 DIAGNOSIS — C50411 Malignant neoplasm of upper-outer quadrant of right female breast: Secondary | ICD-10-CM

## 2018-08-26 DIAGNOSIS — M5137 Other intervertebral disc degeneration, lumbosacral region: Secondary | ICD-10-CM

## 2018-08-26 NOTE — Progress Notes (Signed)
Call the patient and gave her the results of her brain and CT scans and also her bone scan.  She understands that there are some lesions on her spine which may be unrelated but might be cancer and we would like to proceed to an MRI of the spine.  She does not have contrast allergy for MRI dye and she is agreeable.  We will try to get that done this week.  She will be meeting with her plastic surgeon today and she will be discussing the definitive surgical plan with her surgeon later this week.

## 2018-08-28 ENCOUNTER — Telehealth: Payer: Self-pay

## 2018-08-28 NOTE — Telephone Encounter (Signed)
Patient called to follow up regarding scheduling MRI of total spine.   Nurse scheduled patient for 09/03/2018 @ 1pm, with arrival time of 12:30.  Patient aware.

## 2018-08-29 ENCOUNTER — Encounter: Payer: Self-pay | Admitting: Gastroenterology

## 2018-09-03 ENCOUNTER — Ambulatory Visit (HOSPITAL_COMMUNITY)
Admission: RE | Admit: 2018-09-03 | Discharge: 2018-09-03 | Disposition: A | Discharge status: Home / Self Care | Payer: BLUE CROSS/BLUE SHIELD | Source: Ambulatory Visit | Attending: Oncology | Admitting: Oncology

## 2018-09-03 DIAGNOSIS — Z170 Estrogen receptor positive status [ER+]: Secondary | ICD-10-CM | POA: Insufficient documentation

## 2018-09-03 DIAGNOSIS — M50223 Other cervical disc displacement at C6-C7 level: Secondary | ICD-10-CM | POA: Diagnosis not present

## 2018-09-03 DIAGNOSIS — C50411 Malignant neoplasm of upper-outer quadrant of right female breast: Secondary | ICD-10-CM | POA: Insufficient documentation

## 2018-09-03 DIAGNOSIS — M899 Disorder of bone, unspecified: Secondary | ICD-10-CM | POA: Insufficient documentation

## 2018-09-03 DIAGNOSIS — M4804 Spinal stenosis, thoracic region: Secondary | ICD-10-CM | POA: Diagnosis not present

## 2018-09-03 DIAGNOSIS — M48061 Spinal stenosis, lumbar region without neurogenic claudication: Secondary | ICD-10-CM | POA: Diagnosis not present

## 2018-09-03 DIAGNOSIS — M5137 Other intervertebral disc degeneration, lumbosacral region: Secondary | ICD-10-CM | POA: Diagnosis not present

## 2018-09-03 DIAGNOSIS — M5124 Other intervertebral disc displacement, thoracic region: Secondary | ICD-10-CM | POA: Diagnosis not present

## 2018-09-03 DIAGNOSIS — M5126 Other intervertebral disc displacement, lumbar region: Secondary | ICD-10-CM | POA: Diagnosis not present

## 2018-09-03 MED ORDER — GADOBUTROL 1 MMOL/ML IV SOLN
10.0000 mL | Freq: Once | INTRAVENOUS | Status: AC | PRN
  Administered 2018-09-03: 10 mL via INTRAVENOUS

## 2018-09-04 ENCOUNTER — Other Ambulatory Visit: Payer: Self-pay | Admitting: Oncology

## 2018-09-04 ENCOUNTER — Telehealth: Payer: Self-pay | Admitting: *Deleted

## 2018-09-04 NOTE — Telephone Encounter (Signed)
Called pt with negative MR spine results.

## 2018-09-10 ENCOUNTER — Encounter: Payer: Self-pay | Admitting: *Deleted

## 2018-09-10 ENCOUNTER — Telehealth: Payer: Self-pay | Admitting: Oncology

## 2018-09-10 NOTE — Telephone Encounter (Signed)
Scheduled appt per 3/2 sch message - pt is aware of appt date and time   

## 2018-09-11 NOTE — Pre-Procedure Instructions (Signed)
Fatimata Talsma Dhillon  09/11/2018      [id number] - [place], [place] [address] Phone: [phone] Fax: [phone]  [place], [place] [address] Phone: [phone] Fax: [phone]  [place], [place] [address] Phone: [phone] Fax: [phone]    Your procedure is scheduled on  Monday 09/16/18.  Report to [facility], Entrance A at [email or web address].M.  Call this number if you have problems the morning of surgery:  [phone]   Remember:  Do not eat or drink after midnight.     Take these medicines the morning of surgery with A SIP OF WATER               anastrozole (ARIMIDEX) ,esomeprazole (NEXIUM),gabapentin (NEURONTIN),lamoTRIgine (LAMICTAL),loratadine (CLARITIN),Vilazodone HCl (VIIBRYD) . As needed-LORazepam (ATIVAN) .  As of today STOP taking any Aspirin (unless otherwise instructed by your surgeon), Aleve, Naproxen, Ibuprofen, Motrin, Advil, Goody's, BC's, all herbal medications, fish oil, and all vitamins.    Do not wear jewelry, make-up or nail polish.  Do not wear lotions, powders, or perfumes, or deodorant.  Do not shave 48 hours prior to surgery.   Do not bring valuables to the hospital.  [facility] is not responsible for any belongings or valuables.  Contacts, dentures or bridgework may not be worn into surgery.  Leave your suitcase in the car.  After surgery it may be brought to your room.  For patients admitted to the hospital, discharge time will be determined by your treatment team.  Patients discharged the day of surgery will not be allowed to drive home.    Special instructions: [facility]- Preparing For Surgery  Before surgery, you can play an important role. Because skin is not sterile, your skin needs to be as free of germs as possible. You can reduce the  number of germs on your skin by washing with CHG (chlorahexidine gluconate) Soap before surgery.  CHG is an antiseptic cleaner which kills germs and bonds with the skin to continue killing germs even after washing.    Oral Hygiene is also important to reduce your risk of infection.  Remember - BRUSH YOUR TEETH THE MORNING OF SURGERY WITH YOUR REGULAR TOOTHPASTE  Please do not use if you have an allergy to CHG or antibacterial soaps. If your skin becomes reddened/irritated stop using the CHG.  Do not shave (including legs and underarms) for at least 48 hours prior to first CHG shower. It is OK to shave your face.  Please follow these instructions carefully.   1. Shower the NIGHT BEFORE SURGERY and the MORNING OF SURGERY with CHG.   2. If you chose to wash your hair, wash your hair first as usual with your normal shampoo.  3. After you shampoo, rinse your hair and body thoroughly to remove the shampoo.  4. Use CHG as you would any other liquid soap. You can apply CHG directly to the skin and wash gently with a scrungie or a clean washcloth.   5. Apply the CHG Soap to your body ONLY FROM THE NECK DOWN.  Do not use on open wounds or open sores. Avoid contact with your eyes, ears, mouth and genitals (private parts). Wash Face and genitals (private parts)  with your normal soap.  6. Wash thoroughly, paying  special attention to the area where your surgery will be performed.  7. Thoroughly rinse your body with warm water from the neck down.  8. DO NOT shower/wash with your normal soap after using and rinsing off the CHG Soap.  9. Pat yourself dry with a CLEAN TOWEL.  10. Wear CLEAN PAJAMAS to bed the night before surgery, wear comfortable clothes the morning of surgery  11. Place CLEAN SHEETS on your bed the night of your first shower and DO NOT SLEEP WITH PETS.  Day of Surgery:  Do not apply any deodorants/lotions.  Please wear clean clothes to the hospital/surgery center.   Remember to  brush your teeth WITH YOUR REGULAR TOOTHPASTE.  Please read over the following fact sheets that you were given. Pain Booklet, Coughing and Deep Breathing and Surgical Site Infection Prevention

## 2018-09-12 ENCOUNTER — Encounter (HOSPITAL_COMMUNITY)
Admission: RE | Admit: 2018-09-12 | Discharge: 2018-09-12 | Disposition: A | Discharge status: Home / Self Care | Payer: BLUE CROSS/BLUE SHIELD | Source: Ambulatory Visit | Attending: Surgery | Admitting: Surgery

## 2018-09-12 ENCOUNTER — Other Ambulatory Visit: Payer: Self-pay

## 2018-09-12 ENCOUNTER — Other Ambulatory Visit: Payer: Self-pay | Admitting: Surgery

## 2018-09-12 ENCOUNTER — Ambulatory Visit: Payer: Medicare Other | Admitting: Oncology

## 2018-09-12 ENCOUNTER — Encounter (HOSPITAL_COMMUNITY): Payer: Self-pay

## 2018-09-12 DIAGNOSIS — C50911 Malignant neoplasm of unspecified site of right female breast: Secondary | ICD-10-CM

## 2018-09-12 DIAGNOSIS — Z01812 Encounter for preprocedural laboratory examination: Secondary | ICD-10-CM | POA: Insufficient documentation

## 2018-09-12 HISTORY — DX: Presence of spectacles and contact lenses: Z97.3

## 2018-09-12 HISTORY — DX: Dyspnea, unspecified: R06.00

## 2018-09-12 HISTORY — DX: Unspecified cataract: H26.9

## 2018-09-12 HISTORY — DX: Malignant (primary) neoplasm, unspecified: C80.1

## 2018-09-12 HISTORY — DX: Bipolar disorder, unspecified: F31.9

## 2018-09-12 LAB — COMPREHENSIVE METABOLIC PANEL
ALT: 6 U/L (ref 0–44)
AST: 40 U/L (ref 15–41)
Albumin: 3.4 g/dL — ABNORMAL LOW (ref 3.5–5.0)
Alkaline Phosphatase: 93 U/L (ref 38–126)
Anion gap: 8 (ref 5–15)
BUN: 6 mg/dL — ABNORMAL LOW (ref 8–23)
CO2: 24 mmol/L (ref 22–32)
Calcium: 8.6 mg/dL — ABNORMAL LOW (ref 8.9–10.3)
Chloride: 108 mmol/L (ref 98–111)
Creatinine, Ser: 0.81 mg/dL (ref 0.44–1.00)
GFR calc Af Amer: >60 mL/min (ref >60)
GFR calc non Af Amer: >60 mL/min (ref >60)
Glucose, Bld: 91 mg/dL (ref 70–99)
Potassium: 5.2 mmol/L — ABNORMAL HIGH (ref 3.5–5.1)
Sodium: 140 mmol/L (ref 135–145)
Total Bilirubin: 1.5 mg/dL — ABNORMAL HIGH (ref 0.3–1.2)
Total Protein: 6.6 g/dL (ref 6.5–8.1)

## 2018-09-12 LAB — CBC
HCT: 41.4 % (ref 36.0–46.0)
Hemoglobin: 13.3 g/dL (ref 12.0–15.0)
MCH: 29 pg (ref 26.0–34.0)
MCHC: 32.1 g/dL (ref 30.0–36.0)
MCV: 90.2 fL (ref 80.0–100.0)
Platelets: 377 10*3/uL (ref 150–400)
RBC: 4.59 MIL/uL (ref 3.87–5.11)
RDW: 13.3 % (ref 11.5–15.5)
WBC: 9 10*3/uL (ref 4.0–10.5)
nRBC: 0 % (ref 0.0–0.2)

## 2018-09-12 NOTE — Progress Notes (Signed)
Pt denies any acute pulmonary issues. Pt denies chest pain. Pt stated that she was released by Dr. Percival Spanish, Cardiology; she is no longer under the care of a cardiologist. Pt denies having a cardiac cath but stated that a stress test was performed > 10 years ago. Pt denies recent labs. Pt verbalized understanding of all pre-op instructions. Pt chart forwarded to PA, Anesthesiology, for review. Requested EKG tracing from [facility].

## 2018-09-13 ENCOUNTER — Encounter (HOSPITAL_COMMUNITY): Payer: Self-pay

## 2018-09-13 NOTE — Progress Notes (Signed)
Anesthesia Chart Review:  Case:  [id number] Date/Time:  09/16/18 0715   Procedure:  RIGHT MASTECTOMY WITH RIGHT AXILLARY SENTINEL LYMPH NODE BIOPSY (Right )   Anesthesia type:  General   Pre-op diagnosis:  RIGHT BREAST CANCER   Location:  [place] OR ROOM 01 / [place] [facility]   Surgeon:  Alphonsa Overall, MD      DISCUSSION: Patient is a 68 year old female scheduled for the above procedure.  History includes never smoker, post-operative N/V, asthma, GERD, Bipolar disorder, fatty liver, fibromyaglia, OSA (no CPAP), exertional dyspnea, right breast cancer, PAF (diagnosed 2014).   She was maintaining SR on 02/2018 EKG (tracing requested). If tracing not received then she will need updated EKG on arrival. She denied chest pain at PAT RN visit. Anesthesia team to evaluate on the day of surgery.   VS: BP (!) 141/55   Pulse 61   Temp 36.6 C   Resp 20   Ht 5' 4.5" (1.638 m)   Wt 95.2 kg   SpO2 100%   BMI 35.46 kg/m    PROVIDERS: Chevis Pretty, FNP is PCP - She is not currently followed by cardiology, but was seen by Minus Breeding, MD in 2016-2017 for palpitations with reported of previous evaluation Loni Beckwith, MD at [facility] in [facility] in 2014 for afib/PAF--although no pertinent cardiology records available from this time. Last visit with Dr. Percival Spanish 07/04/16 indicated palpitations doing okay off b-blocker (had been stopped due to bradycardia/hypotension). No change in therapy or further cardiac evaluation felt indicated at that time.  - Pulmonologist is with [facility]. Last visit with Elijio Miles, PA-C on 11/02/17 for chronic allergic rhinitis and severe persistent reactive airway disease (see [place])   LABS: Labs reviewed: Acceptable for surgery. (all labs ordered are listed, but only abnormal results are displayed)  Labs Reviewed  COMPREHENSIVE METABOLIC PANEL - Abnormal; Notable for the following components:      Result Value   Potassium 5.2 (*)    BUN 6 (*)    Calcium 8.6 (*)    Albumin 3.4 (*)    Total Bilirubin 1.5 (*)    All other components within normal limits  CBC    Portable Home Sleep Study Report 04/04/11 [facility]; scanned under Media tab, Correspondence, 05/31/11): FINDINGS: 1.  Mild obstructive sleep apnea (AHI)13, (RDI) 17 and desaturations of 81%. 2.  Significant snoring. 3.  Excessive daytime sleepiness  IMAGES: CT Chest 08/23/18: IMPRESSION: 1. 4 mm left lower lobe pulmonary nodule, nonspecific. Likely benign but attention on follow-up recommended. 2.  Aortic Atherosclerois (ICD10-170.0)  CTA head/neck 05/18/16: IMPRESSION: Negative examination.No evidence of cerebral vascular disease. Mild atherosclerosis of the aortic arch.   EKG: EKG 02/25/18 [facility]): Tracing Requested. Per Care Everywhere: "Sinus rhythm with marked sinus arrhythmia Otherwise normal ECG"   CV: Echo 05/16/16 ([place]): Summary Technically limited and difficult study All chambers are normal in size Normal LV systolic function calculated LV ejection fraction of 62% Valves appear structurally normal mild tricuspid regurgitation with normal right ventricular systolic pressure Sinus bradycardia noted during the study No pericardial effusion  48 Hour Holter monitor 03/19/15:  Predominant rhythm was sinus. Ventricular ectopies were noted in single, bigeminy, trigeminy, quadrigeminy forms. Supraventricular ectopics were noted in single, pair and run forms. No patient diary was submitted. The quality of the scan was fair, due to the baseline artifact in lead configuration.  Patient reported a stress test > 10 years ago.    Past Medical  History:  Diagnosis Date  . A-fib ([place]) 11/2012   PAF  . Anxiety    takes Ativan  . Asthma 04/07/2011   dx  . Bipolar disorder ([place])   . Cancer [facility])    Right breast  . Depression   . Dyspnea    DOE  . Early cataracts, bilateral   . Fatty liver   .  Fibromyalgia   . Fracture, ankle 10/2008   right and left  . GERD (gastroesophageal reflux disease)   . Headache(784.0)    migraines  . Irritable bowel syndrome   . Mental disorder    dx bipolar  . PONV (postoperative nausea and vomiting)   . Sleep apnea 04/2011    No CPAP  . Wears glasses     Past Surgical History:  Procedure Laterality Date  . COLONOSCOPY    . DILATION AND CURETTAGE OF UTERUS  1981   abnormal pap  . KNEE ARTHROSCOPY Left 2007   x 2  . LUMBAR LAMINECTOMY/DECOMPRESSION MICRODISCECTOMY  05/31/2011   Procedure: LUMBAR LAMINECTOMY/DECOMPRESSION MICRODISCECTOMY;  Surgeon: Johnn Hai;  Location: [facility];  Service: Orthopedics;  Laterality: Left;  Decompression Lumbar four to five and  Lumbar five to Sacral one on Left  (X-Ray)  . PARTIAL HYSTERECTOMY  1982  . TONSILLECTOMY     as child  . TOTAL KNEE ARTHROPLASTY Left 12/18/2005    MEDICATIONS: . anastrozole (ARIMIDEX) 1 MG tablet  . benzonatate (TESSALON PERLES) 100 MG capsule  . clotrimazole-betamethasone (LOTRISONE) cream  . esomeprazole (NEXIUM) 40 MG capsule  . gabapentin (NEURONTIN) 300 MG capsule  . lamoTRIgine (LAMICTAL) 100 MG tablet  . loratadine (CLARITIN) 10 MG tablet  . LORazepam (ATIVAN) 0.5 MG tablet  . montelukast (SINGULAIR) 10 MG tablet  . traZODone (DESYREL) 50 MG tablet  . Vilazodone HCl (VIIBRYD) 40 MG TABS   No current facility-administered medications for this encounter.     Myra Gianotti, PA-C Surgical Short Stay/Anesthesiology [facility] Phone [phone] [facility] Phone [phone] 09/13/2018 11:12 AM

## 2018-09-13 NOTE — Anesthesia Preprocedure Evaluation (Addendum)
Anesthesia Evaluation  Patient identified by MRN, date of birth, ID band Patient awake    Reviewed: Allergy & Precautions, NPO status , Patient's Chart, lab work & pertinent test results  History of Anesthesia Complications (+) PONV and history of anesthetic complications  Airway Mallampati: II  TM Distance: <3 FB Neck ROM: Full  Mouth opening: Limited Mouth Opening  Dental  (+) Teeth Intact, Dental Advisory Given,    Pulmonary shortness of breath, asthma , sleep apnea ,    breath sounds clear to auscultation       Cardiovascular hypertension, + dysrhythmias Atrial Fibrillation  Rhythm:Irregular     Neuro/Psych  Headaches, PSYCHIATRIC DISORDERS Anxiety Depression Bipolar Disorder  Neuromuscular disease    GI/Hepatic Neg liver ROS, hiatal hernia, GERD  Medicated and Controlled,  Endo/Other    Renal/GU      Musculoskeletal  (+) Arthritis , Fibromyalgia -  Abdominal   Peds  Hematology   Anesthesia Other Findings   Reproductive/Obstetrics                            Anesthesia Physical Anesthesia Plan  ASA: II  Anesthesia Plan: General and Regional   Post-op Pain Management:    Induction: Intravenous  PONV Risk Score and Plan: 4 or greater and Ondansetron, Dexamethasone, Scopolamine patch - Pre-op and Midazolam  Airway Management Planned: LMA and Oral ETT  Additional Equipment: None  Intra-op Plan:   Post-operative Plan: Extubation in OR  Informed Consent: I have reviewed the patients History and Physical, chart, labs and discussed the procedure including the risks, benefits and alternatives for the proposed anesthesia with the patient or authorized representative who has indicated his/her understanding and acceptance.     Dental advisory given  Plan Discussed with: CRNA and Surgeon  Anesthesia Plan Comments: (PAT note written 09/13/2018 by Myra Gianotti, PA-C. )        Anesthesia Quick Evaluation

## 2018-09-15 NOTE — H&P (Signed)
Doris Smith  Location: [facility] Patient #: [id number] DOB: [birth year] Undefined / Language: Cleophus Molt / Race: White Female  History of Present Illness   The patient is a 68 year old female who presents with a complaint of breast cancer.  The PCP is Ronnald Collum, FNP.  The patient was referred by Dr. Jenne Campus.  The pateint is at the Breast [facility] - Oncology is Drs. Magrinat and [place]. She comes by herself.  She noticed an inversion of her right nipple over the last couple months. She thought her right breast was hard compared to her left breast. She thinks her last mammogram was about 2 years ago, but is somewhat unclear. She has no family history of breast cancer. She had a history of a hystrectomy at age 14 for bleeding. She was never placed on hormones.  [facility]amm[facility]: [facility] - Interval diffuse distortion throughout heterogeneously dense glandular tissue in the right breast involving all 4 quadrants. This spans an area measuring approximately 9.7 x 9 3 x 5.0 cm. Ultrasound-guided core needle biopsies of the 2.7 cm mass in the 9 o'clock position of the right breast and 1.9 cm mass in the 1 o'clock position of the right breast. Biopsy: Right breast biopsy x 2, 9 o'clock and 1 o'clock - 08/02/2018 ([id number]) - IDC, grade 2-3, ER - 90%, PR - 1%, Ki67 - 1%, Her2Neu - neg Family history of breast or ovarian cancer: On hormone therapy:  I discussed the options for breast cancer treatment with the patient. The patient is at the [facility], which includes medical oncology and radiation oncology. I discussed the surgical options of lumpectomy vs. mastectomy. If mastectomy, there is the possibility of reconstruction. I discussed the options of lymph node biopsy. The treatment plan depends on the pathologic staging of the tumor and the patient's personal wishes. The risks of surgery include,  but are not limited to, bleeding, infection, the need for further surgery, and nerve injury. The patient has been given literature on the treatment of breast cancer. She is interested in reconstruction.  Plan: 1) Plastic surgery constulation, 2) MRI of breast and metatstatic workup, 3) Right mastectomy with right axillary sentinel lymph node biopsy (possbile immediate reconstruction), 4) Radiation to right chest wall, 5) Dr. Jana Hakim will start anastrazole while she is being worked up  Past Medical History: 1. Right breast cancer 2. back surgery - 2015 - Dr. Tonita Cong She still has back pain 3. Left knee replacement - 2008 - [facility] (her husband had open heart surgery around the same time) She is having right knee trouble now and will probably need a replacement on the right 4. HH with GERD Sees Dr. Silverio Decamp 5. IBS and fibromyalgia 6. colonoscopy in 2019  Social History: Married. Has two sons. She and her husband live in [place] - her sons live in [place]   Past Surgical History Tawni Pummel, RN; 08/14/2018 7:35 AM) Breast Biopsy  Right. multiple Hysterectomy (due to cancer) - Partial  Oral Surgery  Tonsillectomy   Diagnostic Studies History Tawni Pummel, RN; 08/14/2018 7:35 AM) Colonoscopy  1-5 years ago Mammogram  within last year Pap Smear  1-5 years ago  Medication History Tawni Pummel, RN; 08/14/2018 7:36 AM) Medications Reconciled  Social History Tawni Pummel, RN; 08/14/2018 7:35 AM) Caffeine use  Tea. No alcohol use  No drug use  Tobacco use  Never smoker.  Family History Tawni Pummel, RN; 08/14/2018 7:35 AM) Alcohol Abuse  Family Members In  General, Father. Arthritis  Mother. Breast Cancer  Family Members In General. Depression  Mother. Diabetes Mellitus  Mother, Sister. Respiratory Condition  Mother. Thyroid problems  Father.  Pregnancy / Birth History Tawni Pummel, RN; 08/14/2018 7:35 AM) Age  at menarche  40 years. Age of menopause  <47 Gravida  2 Irregular periods  Length (months) of breastfeeding  3-6 Maternal age  74-20 Para  2  Other Problems Tawni Pummel, RN; 08/14/2018 7:35 AM) Anxiety Disorder  Arthritis  Atrial Fibrillation  Back Pain  Depression  Gastroesophageal Reflux Disease  Umbilical Hernia Repair     Review of Systems Sunday Spillers Ledford RN; 08/14/2018 7:35 AM) General Not Present- Appetite Loss, Chills, Fatigue, Fever, Night Sweats, Weight Gain and Weight Loss. Skin Not Present- Change in Wart/Mole, Dryness, Hives, Jaundice, New Lesions, Non-Healing Wounds, Rash and Ulcer. HEENT Present- Seasonal Allergies. Not Present- Earache, Hearing Loss, Hoarseness, Nose Bleed, Oral Ulcers, Ringing in the Ears, Sinus Pain, Sore Throat, Visual Disturbances, Wears glasses/contact lenses and Yellow Eyes. Respiratory Not Present- Bloody sputum, Chronic Cough, Difficulty Breathing, Snoring and Wheezing. Breast Not Present- Breast Mass, Breast Pain, Nipple Discharge and Skin Changes. Cardiovascular Not Present- Chest Pain, Difficulty Breathing Lying Down, Leg Cramps, Palpitations, Rapid Heart Rate, Shortness of Breath and Swelling of Extremities. Female Genitourinary Not Present- Frequency, Nocturia, Painful Urination, Pelvic Pain and Urgency. Musculoskeletal Present- Back Pain. Not Present- Joint Pain, Joint Stiffness, Muscle Pain, Muscle Weakness and Swelling of Extremities. Psychiatric Present- Anxiety, Bipolar and Depression. Not Present- Change in Sleep Pattern, Fearful and Frequent crying. Endocrine Present- Hot flashes. Not Present- Cold Intolerance, Excessive Hunger, Hair Changes, Heat Intolerance and New Diabetes. Hematology Not Present- Blood Thinners, Easy Bruising, Excessive bleeding, Gland problems, HIV and Persistent Infections.   Physical Exam  General: Older WF who isalert and generally healthy appearing. Skin: Inspection and palpation of the  skin unremarkable.  Eyes: Conjunctivae white, pupils equal. Face, ears, nose, mouth, and throat: Face - normal. Normal ears and nose. Lips and teeth normal.  Neck: Supple. No mass. Trachea midline. No thyroid mass.  Lymph Nodes: No supraclavicular or cervical adenopathy. No axillary adenopathy.  Lungs: Normal respiratory effort. Clear to auscultation and symmetric breath sounds. Cardiovascular: Regular rate and rythm. Normal auscultation of the heart. No murmur or rub.  Breasts: Right - The right breast is firm, nodular, and abnormal feeling. Her right nipple is retracted. These changes involve most of the upper central breast. Left - No mass or nodule.  Abdomen: Soft. No mass. Liver and spleen not palpable. No tenderness. No hernia. Normal bowel sounds.  Rectal: Not done.  Musculoskeletal/extremities: Limps with right knee pain. Uses a walker. Good strength and ROM in upper and lower extremities.   Neurologic: Grossly intact to motor and sensory function.   Psychiatric: Has normal mood and affect. Judgement and insight appear normal.    Assessment & Plan  1.  BREAST CANCER, STAGE 2, RIGHT (C50.911)  Story: Right breast biopsy x 2 - 08/02/2018 ([id number]) - LOBULAR cancer, grade 2-3, ER - 90%, PR - 1%, Ki67 - 1%, Her2Neu - neg  Oncology - Magrinat and [place]  Plan:   1) Plastic surgery constulation -  08/27/2018 - saw Dr. Iran Planas - will delay any reconstruction until after the surgery.    2) MRI of breast (scheduled 2/10) and metatstatic workup,   Breast MRI - 08/19/2018 -    1. Multicentric right breast carcinoma. There is extensive abnormal enhancement, predominantly non mass enhancement, throughout much of the right breast  spanning 6.8 x 6.5 x 8.6 cm, corresponding to the abnormal density and distortion noted right breast on diagnostic mammography. The biopsy clips from the recent positive ultrasound-guided core needle biopsies lie in  the anterior upper inner quadrant of the right breast at 1 o'clock and the posterior lower outer quadrant, described as 9 o'clock, but appearing below the nipple line on MRI. The extensive abnormal enhancement extends, uninterrupted, between the 2 biopsy clips.    2. No abnormal enlarged axillary lymph nodes.    The bone scan (08/23/2018)raised the question of spines mets, but MRI of spine on 09/03/2018 was negative.   3) Right mastectomy with right axillary sentinel lymph node biopsy (possbile immediate reconstruction),   4) Radiation to right chest wall,   5) Dr. Jana Hakim will start anastrazole while she is being worked up  2. back surgery - 2015 - Dr. Tonita Cong  She still has back pain 3. Left knee replacement - 2008 - [place] (her husband had open heart surgery around the same time) She is having right knee trouble now and will probably need a replacement on the right 4. HH with GERD Sees Dr. Silverio Decamp 5. IBS and fibromyalgia   Alphonsa Overall, MD, [facility] Surgery Pager: [phone] Office phone:  [phone]

## 2018-09-16 ENCOUNTER — Encounter (HOSPITAL_COMMUNITY)
Admission: RE | Admit: 2018-09-16 | Discharge: 2018-09-16 | Disposition: A | Discharge status: Home / Self Care | Payer: BLUE CROSS/BLUE SHIELD | Source: Ambulatory Visit | Attending: Surgery | Admitting: Surgery

## 2018-09-16 ENCOUNTER — Encounter (HOSPITAL_COMMUNITY): Payer: Self-pay | Admitting: Certified Registered Nurse Anesthetist

## 2018-09-16 ENCOUNTER — Other Ambulatory Visit: Payer: Self-pay

## 2018-09-16 ENCOUNTER — Observation Stay (HOSPITAL_COMMUNITY)
Admission: RE | Admit: 2018-09-16 | Discharge: 2018-09-17 | Disposition: A | Discharge status: Home / Self Care | Payer: BLUE CROSS/BLUE SHIELD | Source: Ambulatory Visit | Attending: Surgery | Admitting: Surgery

## 2018-09-16 ENCOUNTER — Ambulatory Visit (HOSPITAL_COMMUNITY): Payer: BLUE CROSS/BLUE SHIELD | Admitting: Vascular Surgery

## 2018-09-16 ENCOUNTER — Ambulatory Visit (HOSPITAL_COMMUNITY): Payer: BLUE CROSS/BLUE SHIELD | Admitting: Anesthesiology

## 2018-09-16 ENCOUNTER — Encounter (HOSPITAL_COMMUNITY)
Admission: RE | Disposition: A | Discharge status: Home / Self Care | Payer: Self-pay | Source: Ambulatory Visit | Attending: Surgery

## 2018-09-16 DIAGNOSIS — G4730 Sleep apnea, unspecified: Secondary | ICD-10-CM | POA: Diagnosis not present

## 2018-09-16 DIAGNOSIS — C50811 Malignant neoplasm of overlapping sites of right female breast: Principal | ICD-10-CM | POA: Insufficient documentation

## 2018-09-16 DIAGNOSIS — F329 Major depressive disorder, single episode, unspecified: Secondary | ICD-10-CM | POA: Insufficient documentation

## 2018-09-16 DIAGNOSIS — I4891 Unspecified atrial fibrillation: Secondary | ICD-10-CM | POA: Insufficient documentation

## 2018-09-16 DIAGNOSIS — Z96652 Presence of left artificial knee joint: Secondary | ICD-10-CM | POA: Insufficient documentation

## 2018-09-16 DIAGNOSIS — I10 Essential (primary) hypertension: Secondary | ICD-10-CM | POA: Diagnosis not present

## 2018-09-16 DIAGNOSIS — Z79899 Other long term (current) drug therapy: Secondary | ICD-10-CM | POA: Insufficient documentation

## 2018-09-16 DIAGNOSIS — Z170 Estrogen receptor positive status [ER+]: Secondary | ICD-10-CM | POA: Diagnosis not present

## 2018-09-16 DIAGNOSIS — C50911 Malignant neoplasm of unspecified site of right female breast: Secondary | ICD-10-CM | POA: Diagnosis not present

## 2018-09-16 DIAGNOSIS — K219 Gastro-esophageal reflux disease without esophagitis: Secondary | ICD-10-CM | POA: Insufficient documentation

## 2018-09-16 DIAGNOSIS — G8918 Other acute postprocedural pain: Secondary | ICD-10-CM | POA: Diagnosis not present

## 2018-09-16 DIAGNOSIS — F418 Other specified anxiety disorders: Secondary | ICD-10-CM | POA: Diagnosis not present

## 2018-09-16 DIAGNOSIS — Z833 Family history of diabetes mellitus: Secondary | ICD-10-CM | POA: Insufficient documentation

## 2018-09-16 DIAGNOSIS — Z803 Family history of malignant neoplasm of breast: Secondary | ICD-10-CM | POA: Diagnosis not present

## 2018-09-16 DIAGNOSIS — C50212 Malignant neoplasm of upper-inner quadrant of left female breast: Secondary | ICD-10-CM | POA: Diagnosis not present

## 2018-09-16 DIAGNOSIS — M797 Fibromyalgia: Secondary | ICD-10-CM | POA: Insufficient documentation

## 2018-09-16 DIAGNOSIS — C779 Secondary and unspecified malignant neoplasm of lymph node, unspecified: Secondary | ICD-10-CM | POA: Diagnosis not present

## 2018-09-16 HISTORY — PX: MASTECTOMY W/ SENTINEL NODE BIOPSY: SHX2001

## 2018-09-16 SURGERY — MASTECTOMY WITH SENTINEL LYMPH NODE BIOPSY
Anesthesia: Regional | Site: Breast | Laterality: Right

## 2018-09-16 MED ORDER — HYDROCODONE-ACETAMINOPHEN 5-325 MG PO TABS
ORAL_TABLET | ORAL | Status: AC
  Administered 2018-09-16: 2 via ORAL
  Filled 2018-09-16: qty 2

## 2018-09-16 MED ORDER — PROPOFOL 10 MG/ML IV BOLUS
INTRAVENOUS | Status: DC | PRN
  Administered 2018-09-16: 140 mg via INTRAVENOUS

## 2018-09-16 MED ORDER — ACETAMINOPHEN 500 MG PO TABS: 1000.0000 mg | ORAL_TABLET | Freq: Once | ORAL | Status: DC | PRN

## 2018-09-16 MED ORDER — PROPOFOL 10 MG/ML IV BOLUS
INTRAVENOUS | Status: AC
  Filled 2018-09-16: qty 20

## 2018-09-16 MED ORDER — LACTATED RINGERS IV SOLN
INTRAVENOUS | Status: DC | PRN
  Administered 2018-09-16 (×2): via INTRAVENOUS

## 2018-09-16 MED ORDER — KCL IN DEXTROSE-NACL 20-5-0.45 MEQ/L-%-% IV SOLN
INTRAVENOUS | Status: DC
  Administered 2018-09-16 – 2018-09-17 (×2): via INTRAVENOUS
  Filled 2018-09-16 (×2): qty 1000

## 2018-09-16 MED ORDER — PANTOPRAZOLE SODIUM 40 MG PO TBEC
40.0000 mg | DELAYED_RELEASE_TABLET | Freq: Every day | ORAL | Status: DC
  Administered 2018-09-17: 40 mg via ORAL
  Filled 2018-09-16: qty 1

## 2018-09-16 MED ORDER — MORPHINE SULFATE (PF) 2 MG/ML IV SOLN: 1.0000 mg | INTRAVENOUS | Status: DC | PRN

## 2018-09-16 MED ORDER — TECHNETIUM TC 99M SULFUR COLLOID FILTERED
1.0000 | Freq: Once | INTRAVENOUS | Status: AC | PRN
  Administered 2018-09-16: 1 via INTRADERMAL

## 2018-09-16 MED ORDER — 0.9 % SODIUM CHLORIDE (POUR BTL) OPTIME
TOPICAL | Status: DC | PRN
  Administered 2018-09-16: 1000 mL

## 2018-09-16 MED ORDER — EPHEDRINE SULFATE-NACL 50-0.9 MG/10ML-% IV SOSY
PREFILLED_SYRINGE | INTRAVENOUS | Status: DC | PRN
  Administered 2018-09-16 (×3): 5 mg via INTRAVENOUS

## 2018-09-16 MED ORDER — ACETAMINOPHEN 10 MG/ML IV SOLN: 1000.0000 mg | Freq: Once | INTRAVENOUS | Status: DC | PRN

## 2018-09-16 MED ORDER — PHENYLEPHRINE 40 MCG/ML (10ML) SYRINGE FOR IV PUSH (FOR BLOOD PRESSURE SUPPORT)
PREFILLED_SYRINGE | INTRAVENOUS | Status: DC | PRN
  Administered 2018-09-16 (×5): 80 ug via INTRAVENOUS

## 2018-09-16 MED ORDER — FENTANYL CITRATE (PF) 250 MCG/5ML IJ SOLN
INTRAMUSCULAR | Status: AC
  Filled 2018-09-16: qty 5

## 2018-09-16 MED ORDER — GABAPENTIN 300 MG PO CAPS
ORAL_CAPSULE | ORAL | Status: AC
  Filled 2018-09-16: qty 1

## 2018-09-16 MED ORDER — CHLORHEXIDINE GLUCONATE CLOTH 2 % EX PADS: 6.0000 | MEDICATED_PAD | Freq: Once | CUTANEOUS | Status: DC

## 2018-09-16 MED ORDER — ROPIVACAINE HCL 7.5 MG/ML IJ SOLN
INTRAMUSCULAR | Status: DC | PRN
  Administered 2018-09-16: 30 mL via PERINEURAL

## 2018-09-16 MED ORDER — GABAPENTIN 300 MG PO CAPS
300.0000 mg | ORAL_CAPSULE | Freq: Two times a day (BID) | ORAL | Status: DC
  Administered 2018-09-16 – 2018-09-17 (×2): 300 mg via ORAL
  Filled 2018-09-16 (×2): qty 1

## 2018-09-16 MED ORDER — GABAPENTIN 300 MG PO CAPS: 300.0000 mg | ORAL_CAPSULE | ORAL | Status: DC

## 2018-09-16 MED ORDER — SCOPOLAMINE 1 MG/3DAYS TD PT72
MEDICATED_PATCH | TRANSDERMAL | Status: DC | PRN
  Administered 2018-09-16: 1 via TRANSDERMAL

## 2018-09-16 MED ORDER — EPHEDRINE 5 MG/ML INJ
INTRAVENOUS | Status: AC
  Filled 2018-09-16: qty 10

## 2018-09-16 MED ORDER — ONDANSETRON HCL 4 MG/2ML IJ SOLN
INTRAMUSCULAR | Status: DC | PRN
  Administered 2018-09-16: 4 mg via INTRAVENOUS

## 2018-09-16 MED ORDER — OXYCODONE HCL 5 MG/5ML PO SOLN: 5.0000 mg | Freq: Once | ORAL | Status: DC | PRN

## 2018-09-16 MED ORDER — LIDOCAINE 2% (20 MG/ML) 5 ML SYRINGE
INTRAMUSCULAR | Status: DC | PRN
  Administered 2018-09-16: 40 mg via INTRAVENOUS

## 2018-09-16 MED ORDER — DEXAMETHASONE SODIUM PHOSPHATE 10 MG/ML IJ SOLN
INTRAMUSCULAR | Status: DC | PRN
  Administered 2018-09-16: 10 mg via INTRAVENOUS

## 2018-09-16 MED ORDER — MIDAZOLAM HCL 2 MG/2ML IJ SOLN
INTRAMUSCULAR | Status: AC
  Filled 2018-09-16: qty 2

## 2018-09-16 MED ORDER — PHENYLEPHRINE 40 MCG/ML (10ML) SYRINGE FOR IV PUSH (FOR BLOOD PRESSURE SUPPORT)
PREFILLED_SYRINGE | INTRAVENOUS | Status: AC
  Filled 2018-09-16: qty 10

## 2018-09-16 MED ORDER — ONDANSETRON HCL 4 MG/2ML IJ SOLN: 4.0000 mg | Freq: Four times a day (QID) | INTRAMUSCULAR | Status: DC | PRN

## 2018-09-16 MED ORDER — FENTANYL CITRATE (PF) 100 MCG/2ML IJ SOLN
INTRAMUSCULAR | Status: AC
  Administered 2018-09-16: 50 ug via INTRAVENOUS
  Filled 2018-09-16: qty 2

## 2018-09-16 MED ORDER — ROCURONIUM BROMIDE 50 MG/5ML IV SOSY
PREFILLED_SYRINGE | INTRAVENOUS | Status: DC | PRN
  Administered 2018-09-16: 50 mg via INTRAVENOUS

## 2018-09-16 MED ORDER — LIDOCAINE 2% (20 MG/ML) 5 ML SYRINGE
INTRAMUSCULAR | Status: AC
  Filled 2018-09-16: qty 5

## 2018-09-16 MED ORDER — ACETAMINOPHEN 500 MG PO TABS
1000.0000 mg | ORAL_TABLET | ORAL | Status: AC
  Administered 2018-09-16: 1000 mg via ORAL

## 2018-09-16 MED ORDER — ROCURONIUM BROMIDE 10 MG/ML (PF) SYRINGE: PREFILLED_SYRINGE | INTRAVENOUS | Status: DC | PRN

## 2018-09-16 MED ORDER — OXYCODONE HCL 5 MG PO TABS: 5.0000 mg | ORAL_TABLET | Freq: Once | ORAL | Status: DC | PRN

## 2018-09-16 MED ORDER — ACETAMINOPHEN 160 MG/5ML PO SOLN: 1000.0000 mg | Freq: Once | ORAL | Status: DC | PRN

## 2018-09-16 MED ORDER — ENOXAPARIN SODIUM 40 MG/0.4ML ~~LOC~~ SOLN
40.0000 mg | SUBCUTANEOUS | Status: DC
  Administered 2018-09-17: 40 mg via SUBCUTANEOUS
  Filled 2018-09-16: qty 0.4

## 2018-09-16 MED ORDER — FENTANYL CITRATE (PF) 250 MCG/5ML IJ SOLN
INTRAMUSCULAR | Status: DC | PRN
  Administered 2018-09-16: 50 ug via INTRAVENOUS
  Administered 2018-09-16: 100 ug via INTRAVENOUS
  Administered 2018-09-16 (×2): 50 ug via INTRAVENOUS

## 2018-09-16 MED ORDER — FENTANYL CITRATE (PF) 100 MCG/2ML IJ SOLN
25.0000 ug | INTRAMUSCULAR | Status: DC | PRN
  Administered 2018-09-16 (×2): 50 ug via INTRAVENOUS

## 2018-09-16 MED ORDER — ACETAMINOPHEN 500 MG PO TABS
ORAL_TABLET | ORAL | Status: AC
  Administered 2018-09-16: 1000 mg via ORAL
  Filled 2018-09-16: qty 2

## 2018-09-16 MED ORDER — CEFAZOLIN SODIUM-DEXTROSE 2-4 GM/100ML-% IV SOLN
INTRAVENOUS | Status: AC
  Filled 2018-09-16: qty 100

## 2018-09-16 MED ORDER — ONDANSETRON HCL 4 MG/2ML IJ SOLN
INTRAMUSCULAR | Status: AC
  Filled 2018-09-16: qty 2

## 2018-09-16 MED ORDER — METHYLENE BLUE 0.5 % INJ SOLN
INTRAVENOUS | Status: AC
  Filled 2018-09-16: qty 10

## 2018-09-16 MED ORDER — ROCURONIUM BROMIDE 50 MG/5ML IV SOSY
PREFILLED_SYRINGE | INTRAVENOUS | Status: AC
  Filled 2018-09-16: qty 5

## 2018-09-16 MED ORDER — HYDROCODONE-ACETAMINOPHEN 5-325 MG PO TABS
ORAL_TABLET | ORAL | Status: AC
  Filled 2018-09-16: qty 2

## 2018-09-16 MED ORDER — LORAZEPAM 0.5 MG PO TABS
0.5000 mg | ORAL_TABLET | Freq: Two times a day (BID) | ORAL | Status: DC | PRN
  Administered 2018-09-16: 0.5 mg via ORAL
  Filled 2018-09-16: qty 1

## 2018-09-16 MED ORDER — ONDANSETRON 4 MG PO TBDP: 4.0000 mg | ORAL_TABLET | Freq: Four times a day (QID) | ORAL | Status: DC | PRN

## 2018-09-16 MED ORDER — MIDAZOLAM HCL 5 MG/5ML IJ SOLN
INTRAMUSCULAR | Status: DC | PRN
  Administered 2018-09-16 (×2): 1 mg via INTRAVENOUS

## 2018-09-16 MED ORDER — TRAMADOL HCL 50 MG PO TABS: 50.0000 mg | ORAL_TABLET | Freq: Four times a day (QID) | ORAL | Status: DC | PRN

## 2018-09-16 MED ORDER — TRAZODONE HCL 50 MG PO TABS: 25.0000 mg | ORAL_TABLET | Freq: Every evening | ORAL | Status: DC | PRN

## 2018-09-16 MED ORDER — IBUPROFEN 600 MG PO TABS: 600.0000 mg | ORAL_TABLET | Freq: Four times a day (QID) | ORAL | Status: DC | PRN

## 2018-09-16 MED ORDER — SODIUM CHLORIDE (PF) 0.9 % IJ SOLN
INTRAMUSCULAR | Status: AC
  Filled 2018-09-16: qty 10

## 2018-09-16 MED ORDER — SCOPOLAMINE 1 MG/3DAYS TD PT72
MEDICATED_PATCH | TRANSDERMAL | Status: AC
  Filled 2018-09-16: qty 1

## 2018-09-16 MED ORDER — SODIUM CHLORIDE (PF) 0.9 % IJ SOLN
INTRAVENOUS | Status: DC | PRN
  Administered 2018-09-16: 1 mL

## 2018-09-16 MED ORDER — CEFAZOLIN SODIUM-DEXTROSE 2-4 GM/100ML-% IV SOLN
2.0000 g | INTRAVENOUS | Status: AC
  Administered 2018-09-16: 2 g via INTRAVENOUS

## 2018-09-16 MED ORDER — SUGAMMADEX SODIUM 200 MG/2ML IV SOLN
INTRAVENOUS | Status: DC | PRN
  Administered 2018-09-16: 200 mg via INTRAVENOUS

## 2018-09-16 MED ORDER — LAMOTRIGINE 100 MG PO TABS
100.0000 mg | ORAL_TABLET | Freq: Two times a day (BID) | ORAL | Status: DC
  Administered 2018-09-16 – 2018-09-17 (×3): 100 mg via ORAL
  Filled 2018-09-16 (×3): qty 1

## 2018-09-16 MED ORDER — VILAZODONE HCL 40 MG PO TABS: 40.0000 mg | ORAL_TABLET | Freq: Every day | ORAL | Status: DC

## 2018-09-16 MED ORDER — HYDROCODONE-ACETAMINOPHEN 5-325 MG PO TABS
1.0000 | ORAL_TABLET | ORAL | Status: DC | PRN
  Administered 2018-09-16 (×3): 2 via ORAL
  Filled 2018-09-16: qty 2

## 2018-09-16 SURGICAL SUPPLY — 48 items
APPLIER CLIP 9.375 MED OPEN (MISCELLANEOUS) ×2
BINDER BREAST LRG (GAUZE/BANDAGES/DRESSINGS) IMPLANT
BINDER BREAST XLRG (GAUZE/BANDAGES/DRESSINGS) ×2 IMPLANT
BIOPATCH RED 1 DISK 7.0 (GAUZE/BANDAGES/DRESSINGS) ×2 IMPLANT
CANISTER SUCT 3000ML PPV (MISCELLANEOUS) ×2 IMPLANT
CHLORAPREP W/TINT 26ML (MISCELLANEOUS) ×2 IMPLANT
CLIP APPLIE 9.375 MED OPEN (MISCELLANEOUS) ×1 IMPLANT
CONT SPEC 4OZ CLIKSEAL STRL BL (MISCELLANEOUS) ×2 IMPLANT
COVER PROBE W GEL 5X96 (DRAPES) ×2 IMPLANT
COVER SURGICAL LIGHT HANDLE (MISCELLANEOUS) ×2 IMPLANT
COVER WAND RF STERILE (DRAPES) ×2 IMPLANT
DERMABOND ADVANCED (GAUZE/BANDAGES/DRESSINGS) ×1
DERMABOND ADVANCED .7 DNX12 (GAUZE/BANDAGES/DRESSINGS) ×1 IMPLANT
DRAIN CHANNEL 19F RND (DRAIN) ×2 IMPLANT
DRAPE CHEST BREAST 15X10 FENES (DRAPES) ×2 IMPLANT
DRAPE HALF SHEET 40X57 (DRAPES) ×2 IMPLANT
DRSG PAD ABDOMINAL 8X10 ST (GAUZE/BANDAGES/DRESSINGS) ×4 IMPLANT
DRSG TEGADERM 4X4.75 (GAUZE/BANDAGES/DRESSINGS) ×2 IMPLANT
ELECT REM PT RETURN 9FT ADLT (ELECTROSURGICAL) ×4
ELECTRODE REM PT RTRN 9FT ADLT (ELECTROSURGICAL) ×2 IMPLANT
EVACUATOR SILICONE 100CC (DRAIN) ×2 IMPLANT
GAUZE SPONGE 4X4 12PLY STRL (GAUZE/BANDAGES/DRESSINGS) ×2 IMPLANT
GLOVE SURG SIGNA 7.5 PF LTX (GLOVE) ×2 IMPLANT
GOWN STRL REUS W/ TWL LRG LVL3 (GOWN DISPOSABLE) ×1 IMPLANT
GOWN STRL REUS W/ TWL XL LVL3 (GOWN DISPOSABLE) ×1 IMPLANT
GOWN STRL REUS W/TWL LRG LVL3 (GOWN DISPOSABLE) ×1
GOWN STRL REUS W/TWL XL LVL3 (GOWN DISPOSABLE) ×1
ILLUMINATOR WAVEGUIDE N/F (MISCELLANEOUS) IMPLANT
KIT BASIN OR (CUSTOM PROCEDURE TRAY) ×2 IMPLANT
KIT TURNOVER KIT B (KITS) ×2 IMPLANT
LIGHT WAVEGUIDE WIDE FLAT (MISCELLANEOUS) IMPLANT
MARKER SKIN DUAL TIP RULER LAB (MISCELLANEOUS) ×2 IMPLANT
NEEDLE 18GX1X1/2 (RX/OR ONLY) (NEEDLE) ×2 IMPLANT
NEEDLE FILTER BLUNT 18X 1/2SAF (NEEDLE) ×1
NEEDLE FILTER BLUNT 18X1 1/2 (NEEDLE) ×1 IMPLANT
NEEDLE HYPO 25GX1X1/2 BEV (NEEDLE) ×2 IMPLANT
NS IRRIG 1000ML POUR BTL (IV SOLUTION) ×2 IMPLANT
PACK GENERAL/GYN (CUSTOM PROCEDURE TRAY) ×2 IMPLANT
PAD ARMBOARD 7.5X6 YLW CONV (MISCELLANEOUS) ×2 IMPLANT
PENCIL SMOKE EVACUATOR (MISCELLANEOUS) ×2 IMPLANT
SPECIMEN JAR X LARGE (MISCELLANEOUS) ×2 IMPLANT
SUT ETHILON 2 0 FS 18 (SUTURE) ×2 IMPLANT
SUT MNCRL AB 4-0 PS2 18 (SUTURE) ×2 IMPLANT
SUT SILK 2 0 PERMA HAND 18 BK (SUTURE) ×2 IMPLANT
SUT VIC AB 3-0 SH 18 (SUTURE) ×4 IMPLANT
SYR CONTROL 10ML LL (SYRINGE) ×2 IMPLANT
TOWEL OR 17X24 6PK STRL BLUE (TOWEL DISPOSABLE) ×2 IMPLANT
TOWEL OR 17X26 10 PK STRL BLUE (TOWEL DISPOSABLE) ×2 IMPLANT

## 2018-09-16 NOTE — Anesthesia Procedure Notes (Addendum)
Anesthesia Regional Block: Pectoralis block   Pre-Anesthetic Checklist: ,, timeout performed, Correct Patient, Correct Site, Correct Laterality, Correct Procedure, Correct Position, site marked, Risks and benefits discussed,  Surgical consent,  Pre-op evaluation,  At surgeon's request and post-op pain management  Laterality: Right  Prep: chloraprep       Needles:  Injection technique: Single-shot     Needle Length: 9cm  Needle Gauge: 22     Additional Needles: Arrow StimuQuik ECHO Echogenic Stimulating PNB Needle  Procedures:,,,, ultrasound used (permanent image in chart),,,,  Narrative:  Start time: 09/16/2018 7:24 AM End time: 09/16/2018 7:27 AM Injection made incrementally with aspirations every 5 mL.  Performed by: Personally  Anesthesiologist: Oleta Mouse, MD

## 2018-09-16 NOTE — Interval H&P Note (Signed)
History and Physical Interval Note:  09/16/2018 7:09 AM  Doris Smith  has presented today for surgery, with the diagnosis of RIGHT BREAST CANCER.  The various methods of treatment have been discussed with the patient and family.   Husband at bedside.  After consideration of risks, benefits and other options for treatment, the patient has consented to  Procedure(s): RIGHT MASTECTOMY WITH RIGHT AXILLARY SENTINEL LYMPH NODE BIOPSY (Right) as a surgical intervention.  The patient's history has been reviewed, patient examined, no change in status, stable for surgery.  I have reviewed the patient's chart and labs.  Questions were answered to the patient's satisfaction.     Shann Medal

## 2018-09-16 NOTE — Anesthesia Procedure Notes (Signed)
Procedure Name: Intubation Date/Time: 09/16/2018 7:49 AM Performed by: Harden Mo, CRNA Pre-anesthesia Checklist: Patient identified, Emergency Drugs available, Suction available and Patient being monitored Patient Re-evaluated:Patient Re-evaluated prior to induction Oxygen Delivery Method: Circle System Utilized Preoxygenation: Pre-oxygenation with 100% oxygen Induction Type: IV induction Ventilation: Mask ventilation without difficulty and Oral airway inserted - appropriate to patient size Laryngoscope Size: Sabra Heck and 2 Grade View: Grade II Tube type: Oral Tube size: 7.5 mm Number of attempts: 1 Airway Equipment and Method: Stylet and Oral airway Placement Confirmation: ETT inserted through vocal cords under direct vision,  positive ETCO2 and breath sounds checked- equal and bilateral Secured at: 22 cm Tube secured with: Tape Dental Injury: Teeth and Oropharynx as per pre-operative assessment

## 2018-09-16 NOTE — Transfer of Care (Signed)
Immediate Anesthesia Transfer of Care Note  Patient: Doris Smith  Procedure(s) Performed: RIGHT MASTECTOMY WITH RIGHT AXILLARY SENTINEL LYMPH NODE BIOPSY (Right Breast)  Patient Location: PACU  Anesthesia Type:General and Regional  Level of Consciousness: awake, alert  and oriented  Airway & Oxygen Therapy: Patient Spontanous Breathing  Post-op Assessment: Report given to RN, Post -op Vital signs reviewed and stable and Patient moving all extremities X 4  Post vital signs: Reviewed and stable  Last Vitals:  Vitals Value Taken Time  BP 131/83 09/16/2018  9:33 AM  Temp    Pulse 76 09/16/2018  9:34 AM  Resp 20 09/16/2018  9:34 AM  SpO2 100 % 09/16/2018  9:34 AM  Vitals shown include unvalidated device data.  Last Pain:  Vitals:   09/16/18 0933  PainSc: (P) 8          Complications: No apparent anesthesia complications

## 2018-09-16 NOTE — Op Note (Signed)
09/16/2018  9:39 AM  PATIENT:  Doris Smith, 68 y.o., female, MRN: [id number]  PREOP DIAGNOSIS:  RIGHT BREAST CANCER  POSTOP DIAGNOSIS:   Right breast cancer, 9 o'clock position (T2, N0)  PROCEDURE:   Procedure(s):  RIGHT MASTECTOMY WITH RIGHT AXILLARY SENTINEL LYMPH NODE BIOPSY , deep sentinel lymph node biopsy, Injection with 1 cc of 40% methylene blue  SURGEON:   Alphonsa Overall, M.D.  ASSISTANT:   None  ANESTHESIA:   general  Anesthesiologist: Oleta Mouse, MD CRNA: Harden Mo, CRNA; Trinna Post., CRNA  General  ASA:  2  EBL:  75  ml  BLOOD ADMINISTERED: none  DRAINS: 19 French blake drain  LOCAL MEDICATIONS USED:   Right pectoral block  SPECIMEN:   Right breast (suture lateral) and right axillary sentinel lymph node biopsy (counts 120, background 10, not blue)  COUNTS CORRECT:  YES  INDICATIONS FOR PROCEDURE:  Doris Smith is a 68 y.o. (DOB: 08-21-1950) white female whose primary care physician is Hassell Done, [name], FNP and comes for right mastectomy.   She [facility] with Drs. Magrinat and [place].  She has a diffuse distortion of the right breast with biopsy proven IDC of the right breast.  She saw Dr. Iran Planas for potential reconstruction, but decided to wait for a delayed reconstruction.  The indications and risks of the surgery were explained to the patient.  The risks include, but are not limited to, infection, bleeding, and nerve injury.   In the holding area, her right areola was injected with 1 millicurie of Technitium Sulfur Colloid.  OPERATIVE NOTE;  The patient was taken to room # 1 at [facility] where she underwent a general anesthesia  supervised by Anesthesiologist: Oleta Mouse, MD CRNA: Harden Mo, CRNA; Trinna Post., CRNA. Her right breast and axilla were prepped with ChloraPrep and sterilely draped.    A time-out and the surgical check list was reviewed.    I injected about  1.0 mL of 40% methylene blue around her right areola.   I made an elliptical incision including the areola in the right breast.  I developed skin flaps medially to the lateral edge of the sternum, inferiorly to the investing fascia of the rectus abdominus muscle, laterally to the anterior edge of the latissimus dorsi muscle, and superiorly to about 2 finger breaths below the clavicle.  The breast was reflected off the pectoralis muscle from medial to lateral.  The lateral attachments in the right axilla were divided and the breast removed.  A long suture was placed on the lateral aspect of the breast.   I dissected into the right axilla and found a deep sentinel lymph node.  The node had counts of 120 with a background count of 10.  The lymph node was not blue.  This was sent as a separate specimen.   I brought out a 28 F Blake drain below the inferior flaps.  This was sewn in place with 2-0 Nylons.  I irrigated the wound with 1,000 cc of fluid.   The skin was closed with interrupted 3-0 Vicryl sutures and the skin was closed with a 4-0 Monocryl.  The wound was painted with Dermabond.    A pressure dressing was placed on the wound and the chest wrapped with a breast binder.  Her needle and sponge count were correct at the end of the case.   She was transferred to the recovery room in good condition.  Shanon Brow  Lucia Gaskins, MD, [facility] Pager: [phone] Office phone:  [phone]

## 2018-09-17 ENCOUNTER — Encounter (HOSPITAL_COMMUNITY): Payer: Self-pay | Admitting: Surgery

## 2018-09-17 DIAGNOSIS — M797 Fibromyalgia: Secondary | ICD-10-CM | POA: Diagnosis not present

## 2018-09-17 DIAGNOSIS — Z96652 Presence of left artificial knee joint: Secondary | ICD-10-CM | POA: Diagnosis not present

## 2018-09-17 DIAGNOSIS — K219 Gastro-esophageal reflux disease without esophagitis: Secondary | ICD-10-CM | POA: Diagnosis not present

## 2018-09-17 DIAGNOSIS — C50811 Malignant neoplasm of overlapping sites of right female breast: Secondary | ICD-10-CM | POA: Diagnosis not present

## 2018-09-17 DIAGNOSIS — C50911 Malignant neoplasm of unspecified site of right female breast: Secondary | ICD-10-CM | POA: Diagnosis not present

## 2018-09-17 DIAGNOSIS — G4730 Sleep apnea, unspecified: Secondary | ICD-10-CM | POA: Diagnosis not present

## 2018-09-17 DIAGNOSIS — I10 Essential (primary) hypertension: Secondary | ICD-10-CM | POA: Diagnosis not present

## 2018-09-17 DIAGNOSIS — Z803 Family history of malignant neoplasm of breast: Secondary | ICD-10-CM | POA: Diagnosis not present

## 2018-09-17 DIAGNOSIS — I4891 Unspecified atrial fibrillation: Secondary | ICD-10-CM | POA: Diagnosis not present

## 2018-09-17 DIAGNOSIS — F329 Major depressive disorder, single episode, unspecified: Secondary | ICD-10-CM | POA: Diagnosis not present

## 2018-09-17 DIAGNOSIS — Z79899 Other long term (current) drug therapy: Secondary | ICD-10-CM | POA: Diagnosis not present

## 2018-09-17 DIAGNOSIS — F418 Other specified anxiety disorders: Secondary | ICD-10-CM | POA: Diagnosis not present

## 2018-09-17 DIAGNOSIS — Z833 Family history of diabetes mellitus: Secondary | ICD-10-CM | POA: Diagnosis not present

## 2018-09-17 MED ORDER — HYDROCODONE-ACETAMINOPHEN 5-325 MG PO TABS: 1.0000 | ORAL_TABLET | Freq: Four times a day (QID) | ORAL | 0 refills | Status: DC | PRN

## 2018-09-17 NOTE — Discharge Summary (Signed)
Physician Discharge Summary  Patient ID:  Doris Smith  MRN: [id number]  DOB/AGE: 08-08-1950 68 y.o.  Admit date: 09/16/2018 Discharge date: 09/17/2018  Discharge Diagnoses:  1.  Right breast cancer  Clinical T3 N0, stage IIA invasive lobular carcinoma, grade 2, estrogen receptor strongly positive, 2. Back surgery - 2015 - Dr. Tonita Cong         She still has back pain 3. Left knee replacement - 2008 - [facility] (her husband had open heart surgery around the same time) She is having right knee trouble now and will probably need a replacement on the right 4. HH with GERD Sees Dr. Silverio Decamp 5. IBS and fibromyalgia   Active Problems:   Breast cancer, stage 2, right ([place])  Operation: Procedure(s): RIGHT MASTECTOMY WITH RIGHT AXILLARY SENTINEL LYMPH NODE BIOPSY on 09/16/2018  Discharged Condition: good  Hospital Course: Chevella Pearce is an 68 y.o. female whose primary care physician is Doris Pretty, FNP and who was admitted 09/16/2018 with a chief complaint of right breast cancer.   She was brought to the operating room on 09/16/2018 and underwent  RIGHT MASTECTOMY WITH RIGHT AXILLARY SENTINEL LYMPH NODE BIOPSY.  She is now one day post op.  She has some incisional pain, but is otherwise doing well.   Her husband is at the bedside.  She is ready to go home. The discharge instructions were reviewed with the patient.  Consults: None  Significant Diagnostic Studies: Results for orders placed or performed during the hospital encounter of 09/12/18  CBC  Result Value Ref Range   WBC 9.0 4.0 - 10.5 K/uL   RBC 4.59 3.87 - 5.11 MIL/uL   Hemoglobin 13.3 12.0 - 15.0 g/dL   HCT 41.4 36.0 - 46.0 %   MCV 90.2 80.0 - 100.0 fL   MCH 29.0 26.0 - 34.0 pg   MCHC 32.1 30.0 - 36.0 g/dL   RDW 13.3 11.5 - 15.5 %   Platelets 377 150 - 400 K/uL   nRBC 0.0 0.0 - 0.2 %  Comprehensive metabolic panel  Result Value Ref Range   Sodium 140 135 - 145 mmol/L   Potassium 5.2 (H) 3.5  - 5.1 mmol/L   Chloride 108 98 - 111 mmol/L   CO2 24 22 - 32 mmol/L   Glucose, Bld 91 70 - 99 mg/dL   BUN 6 (L) 8 - 23 mg/dL   Creatinine, Ser 0.81 0.44 - 1.00 mg/dL   Calcium 8.6 (L) 8.9 - 10.3 mg/dL   Total Protein 6.6 6.5 - 8.1 g/dL   Albumin 3.4 (L) 3.5 - 5.0 g/dL   AST 40 15 - 41 U/L   ALT 6 0 - 44 U/L   Alkaline Phosphatase 93 38 - 126 U/L   Total Bilirubin 1.5 (H) 0.3 - 1.2 mg/dL   GFR calc non Af Amer >60 >60 mL/min   GFR calc Af Amer >60 >60 mL/min   Anion gap 8 5 - 15    Ct Head Wo Contrast  Result Date: 08/23/2018 CLINICAL DATA:  Headaches, history of breast cancer EXAM: CT HEAD WITHOUT CONTRAST TECHNIQUE: Contiguous axial images were obtained from the base of the skull through the vertex without intravenous contrast. COMPARISON:  05/18/2016 FINDINGS: Brain: No evidence of acute infarction, hemorrhage, hydrocephalus, extra-axial collection or mass lesion/mass effect. Vascular: No hyperdense vessel or unexpected calcification. Skull: No osseous abnormality. Sinuses/Orbits: Visualized paranasal sinuses are clear. Visualized mastoid sinuses are clear. Visualized orbits demonstrate no focal abnormality. Other: None IMPRESSION: No acute  intracranial pathology. Electronically Signed   By: Kathreen Devoid   On: 08/23/2018 12:34   Ct Chest Wo Contrast  Result Date: 08/23/2018 CLINICAL DATA:  New diagnosis of breast cancer. EXAM: CT CHEST WITHOUT CONTRAST TECHNIQUE: Multidetector CT imaging of the chest was performed following the standard protocol without IV contrast. COMPARISON:  None. FINDINGS: Cardiovascular: The heart size is normal. No substantial pericardial effusion. Atherosclerotic calcification is noted in the wall of the thoracic aorta. Mediastinum/Nodes: No mediastinal lymphadenopathy. No evidence for gross hilar lymphadenopathy although assessment is limited by the lack of intravenous contrast on today's study. The esophagus has normal imaging features. There is no axillary  lymphadenopathy. Lungs/Pleura: The central tracheobronchial airways are patent. Calcified granuloma identified right lower lobe (53/5). 4 mm left lower lobe nodule identified on 81/5. no focal consolidation. No pleural effusion. Upper Abdomen: Large hiatal hernia noted with 75% of the stomach contained in the chest. Musculoskeletal: No worrisome lytic or sclerotic osseous abnormality. IMPRESSION: 1. 4 mm left lower lobe pulmonary nodule, nonspecific. Likely benign but attention on follow-up recommended. 2.  Aortic Atherosclerois (ICD10-170.0) Electronically Signed   By: Misty Stanley M.D.   On: 08/23/2018 10:53   Nm Bone Scan Whole Body  Result Date: 08/23/2018 CLINICAL DATA:  RIGHT breast cancer, multiple masses, staging, back pain for 10 years, lumbar surgery, LEFT knee replacement, RIGHT knee pain and weakness post falls EXAM: NUCLEAR MEDICINE WHOLE BODY BONE SCAN TECHNIQUE: Whole body anterior and posterior images were obtained approximately 3 hours after intravenous injection of radiopharmaceutical. RADIOPHARMACEUTICALS:  21.5 mCi Technetium-66m MDP IV COMPARISON:  None Correlation: CT chest 08/23/2018 FINDINGS: Uptake at the shoulders, RIGHT knee, sternoclavicular joints, typically degenerative. Photopenic defect LEFT knee from knee prosthesis. Uptake identified in the lower thoracic spine at approximately T10 and T11 and in lumbar spine at L2 and L3, worrisome for osseous metastatic disease. No other definite sites of abnormal osseous tracer accumulation are identified. Expected urinary tract and soft tissue distribution of tracer. IMPRESSION: Abnormal uptake in the lower thoracic and lumbar spine at approximately T10, T11, L2, and L3 concerning for osseous metastatic disease; this could be best confirmed by MR imaging. No other worrisome scintigraphic abnormalities. Electronically Signed   By: Lavonia Dana M.D.   On: 08/23/2018 16:48   Mr Breast Bilateral W Wo Contrast Inc Cad  Result Date:  08/20/2018 CLINICAL DATA:  Patient initially presented with right nipple retraction/inversion and right breast firmness for at least 2 months. On diagnostic imaging, multiple abnormalities in the right breast noted, with masses biopsied in the 1 o'clock and 9 o'clock positions, both positive for carcinoma. Recently diagnosed invasive breast carcinoma, stage IIB, estrogen receptor positive. MRI for staging/extent of disease. LABS:  No labs needed at time of imaging. EXAM: BILATERAL BREAST MRI WITH AND WITHOUT CONTRAST TECHNIQUE: Multiplanar, multisequence MR images of both breasts were obtained prior to and following the intravenous administration of 10 mL ml of Gadavist Three-dimensional MR images were rendered by post-processing of the original MR data on an independent workstation. The three-dimensional MR images were interpreted, and findings are reported in the following complete MRI report for this study. Three dimensional images were evaluated at the independent DynaCad workstation COMPARISON:  Previous exam(s). FINDINGS: Breast composition: c. Heterogeneous fibroglandular tissue. Background parenchymal enhancement: Minimal Right breast: There is extensive abnormal enhancement, predominantly clumped non mass enhancement, throughout much of the right breast. A 6 mm mass is noted centrally, slightly lateral to midline, at the nipple level. At the level of  the 1 o'clock position biopsy clip, there is architectural distortion. Abnormal enhancement surrounds the 9 o'clock, more posterior, biopsy clip. The overall extent of the abnormal enhancement extends 6.8 cm, anterior to posterior, 6.5 cm right to left and 8.6 cm from superior to inferior. Left breast: No mass or abnormal enhancement. Lymph nodes: No abnormal appearing lymph nodes. Ancillary findings:  None. IMPRESSION: 1. Multicentric right breast carcinoma. There is extensive abnormal enhancement, predominantly non mass enhancement, throughout much of the  right breast spanning 6.8 x 6.5 x 8.6 cm, corresponding to the abnormal density and distortion noted right breast on diagnostic mammography. The biopsy clips from the recent positive ultrasound-guided core needle biopsies lie in the anterior upper inner quadrant of the right breast at 1 o'clock and the posterior lower outer quadrant, described as 9 o'clock, but appearing below the nipple line on MRI. The extensive abnormal enhancement extends, uninterrupted, between the 2 biopsy clips. 2. No abnormal enlarged axillary lymph nodes. 3. No evidence of left breast malignancy. RECOMMENDATION: 1. Treatment as planned the extensive right breast carcinoma. BI-RADS CATEGORY  6: Known biopsy-proven malignancy. Electronically Signed   By: Lajean Manes M.D.   On: 08/20/2018 09:32   Nm Sentinel Node Inj-no Rpt (breast)  Result Date: 09/16/2018 Sulfur colloid was injected by the nuclear medicine technologist for melanoma sentinel node.   Mr Total Spine Mets Screening  Result Date: 09/03/2018 CLINICAL DATA:  History of metastatic breast cancer. Abnormal uptake in the lower thoracic and lumbar spine on nuclear medicine bone scan. EXAM: MRI TOTAL SPINE WITHOUT AND WITH CONTRAST TECHNIQUE: Multisequence MR imaging of the spine from the cervical spine to the sacrum was performed prior to and following IV contrast administration for evaluation of spinal metastatic disease. Sagittal imaging was performed of the entire spine. Axial images were performed through the lower thoracic and lumbar spine. CONTRAST:  10 mL Gadavist COMPARISON:  Nuclear medicine whole-body bone scan 08/23/2018. Chest CT 08/23/2018. Cervical spine CT 05/15/2016. CT abdomen and pelvis 11/07/2012. FINDINGS: MRI CERVICAL SPINE FINDINGS Alignment: Cervical spine straightening. Unchanged grade 1 anterolisthesis of C3 on C4 and grade 1 retrolisthesis of C4 on C5. Vertebrae: No fracture. Mild-to-moderate endplate edema and enhancement at C4-5 associated with  chronic severe disc space narrowing, considered to be degenerative in etiology. No suspicious osseous lesion. Cord: Normal signal and morphology. Posterior Fossa, vertebral arteries, paraspinal tissues: Unremarkable on this limited study. Disc levels: Assessment of degenerative changes is limited given lack of axial imaging. Disc degeneration most advanced at C4-5 with disc bulging and spurring. Additional disc bulging at C5-6 and C6-7. Disc uncovering, uncovertebral spurring, and advanced facet arthrosis at C3-4. No evidence of significant spinal canal stenosis or spinal cord mass effect. Poor assessment of known moderate to severe neural foraminal stenosis at C3-4. MRI THORACIC SPINE FINDINGS Alignment:  Normal. Vertebrae: No fracture. Endplate edema and enhancement at T10-11 greater than T9-10, degenerative in appearance with associated moderate to severe disc space narrowing as well as degenerative endplate sclerosis on CT. Edema and enhancement about the right T10-11 facet joint, also degenerative in appearance. No suspicious osseous lesion. Cord:  Normal signal and morphology. Paraspinal and other soft tissues: Large hiatal hernia. Disc levels: Diffuse thoracic facet arthrosis, overall greater on the right than on the left. Right facet joint effusion at T10-11 right-sided neural foraminal stenosis from T6-7 to T10-11. Minimal disc bulging in the lower thoracic spine without significant spinal stenosis or spinal cord mass effect. MRI LUMBAR SPINE FINDINGS Segmentation:  Standard. Alignment:  Minimal left convex curvature of the lumbar spine. Slight retrolisthesis of L2 on L3 and L3 on L4. Vertebrae: No fracture. Endplate edema and enhancement at L3-4 greater than L2-3, degenerative in appearance. Disc space narrowing throughout the lumbar spine, severe at L2-3. No suspicious osseous lesion. Scattered small Schmorl's nodes. Conus medullaris: Extends to the L1-2 level and appears normal. Paraspinal and other soft  tissues: Postoperative changes in the posterior lower lumbar soft tissues. Disc levels: L1-2: Mild disc bulging without stenosis. L2-3: Retrolisthesis, disc bulging, endplate spurring, and mild facet and ligamentum flavum hypertrophy result in borderline bilateral lateral recess stenosis without significant spinal or neural foraminal stenosis. L3-4: Retrolisthesis, disc bulging, and moderate facet and ligamentum flavum hypertrophy result in mild spinal stenosis, mild bilateral lateral recess stenosis, and mild-to-moderate bilateral neural foraminal stenosis. L4-5: Mild disc bulging greater to the right and mild to moderate facet and ligamentum flavum hypertrophy result in mild right neural foraminal stenosis without spinal stenosis. L5-S1: Prior left laminotomy. Disc bulging greater to the left, endplate spurring, and asymmetric left facet hypertrophy result in moderate left neural foraminal stenosis without spinal stenosis. IMPRESSION: 1. No evidence of metastatic disease in the cervical, thoracic, or lumbar spine. 2. Bone scan uptake in the lower thoracic and lumbar spine attributed to disc and facet degeneration. 3. Mild spinal stenosis at L3-4. 4. Moderate left neural foraminal stenosis at L5-S1. Electronically Signed   By: Logan Bores M.D.   On: 09/03/2018 14:16    Discharge Exam:  Vitals:   09/17/18 0253 09/17/18 0539  BP: 118/60 120/63  Pulse: 69 66  Resp: 17 18  Temp: 98.1 F (36.7 C) 98.3 F (36.8 C)  SpO2: 97% 97%    General: WN WF who is alert and generally healthy appearing.  Lungs: Clear to auscultation and symmetric breath sounds. Heart:  RRR. No murmur or rub. Chest:  Her right breast incision looks good.  Right breast drain - 170 cc recorded last 24 hours.   Discharge Medications:   Allergies as of 09/17/2018      Reactions   Iohexol    "over 20 years ago" had reaction "hurting in arm and heart"-Done in [place], [place].Marland Kitchenphysician stopped CT at that time.   Codeine Other (See  Comments)   Makes feel like she is wild   Prednisone Other (See Comments)   Makes me very irritable   Sulfonamide Derivatives Other (See Comments)   Upset stomach   Celecoxib Nausea And Vomiting   Sulfa Antibiotics Nausea And Vomiting   Vitamin B12 Rash      Medication List    TAKE these medications   anastrozole 1 MG tablet Commonly known as:  ARIMIDEX Take 1 tablet (1 mg total) by mouth daily.   benzonatate 100 MG capsule Commonly known as:  Tessalon Perles Take 1 capsule (100 mg total) by mouth 3 (three) times daily as needed for cough.   clotrimazole-betamethasone cream Commonly known as:  Lotrisone Apply 1 application topically 2 (two) times daily. What changed:    when to take this  reasons to take this   esomeprazole 40 MG capsule Commonly known as:  NexIUM One daily 30 minutes before breakfast What changed:    how much to take  how to take this  when to take this   gabapentin 300 MG capsule Commonly known as:  NEURONTIN Take 1 capsule (300 mg total) by mouth 3 (three) times daily. What changed:  when to take this   HYDROcodone-acetaminophen 5-325 MG tablet Commonly  known as:  NORCO/VICODIN Take 1 tablet by mouth every 6 (six) hours as needed for moderate pain.   lamoTRIgine 100 MG tablet Commonly known as:  LAMICTAL Take 100 mg by mouth 2 (two) times daily.   loratadine 10 MG tablet Commonly known as:  CLARITIN Take 10 mg by mouth daily.   LORazepam 0.5 MG tablet Commonly known as:  ATIVAN Take 1 tablet (0.5 mg total) by mouth 2 (two) times daily as needed.   montelukast 10 MG tablet Commonly known as:  SINGULAIR Take 10 mg by mouth at bedtime.   traZODone 50 MG tablet Commonly known as:  DESYREL Take 25 mg by mouth at bedtime as needed for sleep.   Viibryd 40 MG Tabs Generic drug:  Vilazodone HCl Take 40 mg by mouth daily.       Disposition: Discharge disposition: 01-Home or Self Care       Discharge Instructions    Diet -  low sodium heart healthy   Complete by:  As directed    Increase activity slowly   Complete by:  As directed       Signed: Alphonsa Overall, M.D., [facility] Office:  [phone]  09/17/2018, 7:35 AM

## 2018-09-17 NOTE — Anesthesia Postprocedure Evaluation (Signed)
Anesthesia Post Note  Patient: Doris Smith  Procedure(s) Performed: RIGHT MASTECTOMY WITH RIGHT AXILLARY SENTINEL LYMPH NODE BIOPSY (Right Breast)     Patient location during evaluation: PACU Anesthesia Type: Regional and General Level of consciousness: awake and alert Pain management: pain level controlled Vital Signs Assessment: post-procedure vital signs reviewed and stable Respiratory status: spontaneous breathing, nonlabored ventilation, respiratory function stable and patient connected to nasal cannula oxygen Cardiovascular status: blood pressure returned to baseline and stable Postop Assessment: no apparent nausea or vomiting Anesthetic complications: no    Last Vitals:  Vitals:   09/17/18 0253 09/17/18 0539  BP: 118/60 120/63  Pulse: 69 66  Resp: 17 18  Temp: 36.7 C 36.8 C  SpO2: 97% 97%    Last Pain:  Vitals:   09/17/18 0539  TempSrc: Oral  PainSc:                  Doris Smith

## 2018-09-17 NOTE — Discharge Instructions (Signed)
[facility] - DISCHARGE INSTRUCTIONS TO PATIENT  Activity:  Driving - May drive in 4 to 7 days, if off pain meds   Lifting - No lifting more than 15 pounds for 7 days, then no limit  Wound Care:   Leave bandage for 2 more days, then may remove and shower         Empty drain bulbs twice a day and record the amount of drainage.  Diet:  As tolerated.  Follow up appointment:  Call Dr. Pollie Friar office [facility]) at [phone] for an appointment in 1 week.  Medications and dosages:  Resume your home medications.  You have a prescription for:  Vicodin  Call Dr. Lucia Gaskins or his office  [phone]) if you have:  Temperature greater than 100.4,  Persistent nausea and vomiting,  Severe uncontrolled pain,  Redness, tenderness, or signs of infection (pain, swelling, redness, odor or green/yellow discharge around the site),  Difficulty breathing, headache or visual disturbances,  Any other questions or concerns you may have after discharge.  In an emergency, call 911 or go to an Emergency Department at a nearby hospital.

## 2018-09-23 ENCOUNTER — Telehealth: Payer: Self-pay | Admitting: *Deleted

## 2018-09-23 NOTE — Telephone Encounter (Signed)
Received order for Mammaprint testing. Requisition faxed to pathology and [facility]. Received by Varney Biles

## 2018-10-01 ENCOUNTER — Other Ambulatory Visit: Payer: Self-pay | Admitting: Gastroenterology

## 2018-10-02 ENCOUNTER — Telehealth: Payer: Self-pay | Admitting: *Deleted

## 2018-10-02 NOTE — Telephone Encounter (Signed)
Pt left message stating she would like to be seen prior to scheduled appointment 4/15 due to " my husband is out of work until 4/6 "  Return call number given as [phone].  This note will be sent to navigators for review due to recent surgery and if appointment change would allow all path/lab results to be available.

## 2018-10-07 ENCOUNTER — Other Ambulatory Visit: Payer: Self-pay | Admitting: Oncology

## 2018-10-07 ENCOUNTER — Telehealth: Payer: Self-pay | Admitting: *Deleted

## 2018-10-07 ENCOUNTER — Ambulatory Visit: Payer: BLUE CROSS/BLUE SHIELD | Admitting: Physical Therapy

## 2018-10-07 NOTE — Telephone Encounter (Signed)
Received Mammaprint result of HIGH RISK. Physician team notified. 

## 2018-10-07 NOTE — Progress Notes (Signed)
Doris Smith had her surgery 09/16/2018 showing a pT3 pN1(mic), stage IIA invasive lobular breast cancer, grade 2, with 1 of 5 sampled lymph nodes involved by micrometastatic deposit, with extra nodular extension.  Her MammaPrint came back high risk.  I called her today and let her know she is going to need chemotherapy.  I sent a note to Dr. Lucia Gaskins letting him know she will need a port.  I have rescheduled her appointment for 10/14/2018 to discuss hemotherapy options.

## 2018-10-08 ENCOUNTER — Other Ambulatory Visit: Payer: Self-pay | Admitting: Surgery

## 2018-10-08 ENCOUNTER — Encounter (HOSPITAL_COMMUNITY): Payer: Self-pay | Admitting: Oncology

## 2018-10-09 ENCOUNTER — Telehealth: Payer: Self-pay

## 2018-10-09 NOTE — Telephone Encounter (Signed)
Returned pt call to number provided.  No answer.   "voice mail not set up yet."

## 2018-10-09 NOTE — Telephone Encounter (Signed)
Spoke with pt by phone and gave appt date/time for 10/14/18

## 2018-10-11 NOTE — Progress Notes (Signed)
Doris Smith  [phone] [phone]     ID: Doris Smith DOB: 1951-01-10  MR#: [id number]  MVE#:[id number]  Patient Care Team: Chevis Pretty, FNP as PCP - General (Nurse Practitioner) Alphonsa Overall, MD as Consulting Physician (General Surgery) Merrit Friesen, Virgie Dad, MD as Consulting Physician (Oncology) Kyung Rudd, MD as Consulting Physician (Radiation Oncology) Elijio Miles, MD as Consulting Physician (Pulmonary Disease) Netta Cedars, MD as Consulting Physician (Orthopedic Surgery) Susa Day, MD as Consulting Physician (Orthopedic Surgery) Janeth Rase, NP as Nurse Practitioner (Adult Health Nurse Practitioner) Mauro Kaufmann, RN as Oncology Nurse Navigator Rockwell Germany, RN as Oncology Nurse Navigator Chauncey Cruel, MD OTHER MD: Doris Smith, psychiatry   CHIEF COMPLAINT: Estrogen receptor positive breast cancer  CURRENT TREATMENT: Adjuvant chemotherapy   INTERVAL HISTORY: Doris Smith returns today for follow up and treatment of her estrogen receptor positive breast cancer.  Since her last visit, she underwent bilateral breast MRI on 08/19/2018, with results showing: multicentric right breast carcinoma with extensive abnormal enhancement, predominantly non-mass enhancement, throughout much of the right breast spanning 6.8 x 6.5 x 8.6 cm; no abnormal enlarged axillary lymph nodes; no evidence of left breast malignancy.   She also underwent staging scans on 08/23/2018. CT chest showed a nonspecific 4 mm left lower lobe pulmonary nodule, likely benign. CT head showed no acute intercranial pathology. Bone scan revealed abnormal uptake in the lower thoracic and lumbar spine at approximately T10, T11, L2, and L3 concerning for osseous metastatic disease, and no other worrisome scintigraphic abnormalities.  She proceeded to total spine MRI on 09/03/2018, with results showing: no evidence of metastatic disease in the cervical,  thoracic, or lumbar spine; bone scan uptake in lower thoracic and lumbar spine attributed to disc and facet degeneration.  She then underwent right mastectomy with sentinel lymph node biopsy on 09/16/2018 under Dr. Lucia Gaskins. Pathology from the procedure ([id number]) revealed: invasive lobular carcinoma, 10.5 cm, grade 2; margins of resection not involved. A total of 5 lymph nodes were biopsied, only one of which was positive for metastatic carcinoma and had extranodal extension.   A sample was sent for Mammaprint testing. Her results were read as "high risk", indicating a high chance of distant disease-free survival at 5 years with chemotherapy in addition to antiestrogens.  She is here today to operationalize those plans.   REVIEW OF SYSTEMS: Doris Smith reports feeling overwhelmed a lot recently. States she's edgy, panicky, everything gets on her nerves, notes it's hard to look at herself because of her breasts. She states when she gets too overwhelmed, she eats. She reports her range of motion has been excellent, and she has had no pain since her surgery. The patient denies unusual headaches, visual changes, nausea, vomiting, stiff neck, dizziness, or gait imbalance. There has been no cough, phlegm production, or pleurisy, no chest pain or pressure, and no change in bowel or bladder habits. The patient denies fever, rash, bleeding, unexplained fatigue or unexplained weight loss. A detailed review of systems was otherwise entirely negative.   HISTORY OF CURRENT ILLNESS: "Doris Smith" presented to her PCP, Dr. Hassell Done, with right breast pain, fullness, and nipple inversion since November 2019. She proceeded to undergo bilateral diagnostic mammography with tomography and right breast ultrasonography at [facility] on 07/31/2018 showing: findings compatible with multicentric breast cancer spanning at least the upper inner and upper outer quadrants of the right breast; normal right axilla. Palpable  firmness of approximately 1.5 cm was noted in the 9 o'clock  position, which was also seen on ultrasound with indisctinct margins measuring 2.7 x 2.3 x 2.2 cm. At 1 o'clock, a mass with poorly defined margins measures 1.9 x 1 x 0.8 cm. Additional poorly defined masses were seen in the 12 o'clock retroareolar region.  Accordingly on 08/02/2018 she proceeded to biopsy of the right breast area in question. The pathology ([id number]) from this procedure showed: invasive mammary carcinoma at 9 o'clock and 1 o'clock; e-cadherin negative, consistent with lobular phenotype; grade 2-3. Prognostic indicators significant for: estrogen receptor, 90% positive and progesterone receptor, 1% positive, both with strong staining intensity. Proliferation marker Ki67 at 1%. HER2 equivocal by immunohistochemistry, 2+ but negative by fluorescent in situ hybridization with a signals ratio 1.04 and number per cell 1.2.   The patient's subsequent history is as detailed below.  PAST MEDICAL HISTORY: Past Medical History:  Diagnosis Date  . A-fib ([place]) 11/2012   PAF  . Anxiety    takes Ativan  . Asthma 04/07/2011   dx  . Bipolar disorder ([place])   . Cancer [facility])    Right breast  . Depression   . Dyspnea    DOE  . Early cataracts, bilateral   . Fatty liver   . Fibromyalgia   . Fracture, ankle 10/2008   right and left  . GERD (gastroesophageal reflux disease)   . Headache(784.0)    migraines  . Irritable bowel syndrome   . Mental disorder    dx bipolar  . PONV (postoperative nausea and vomiting)   . Sleep apnea 04/2011    No CPAP  . Wears glasses   She has chronic sinus headaches, hiatal hernia   PAST SURGICAL HISTORY: Past Surgical History:  Procedure Laterality Date  . COLONOSCOPY    . DILATION AND CURETTAGE OF UTERUS  1981   abnormal pap  . KNEE ARTHROSCOPY Left 2007   x 2  . LUMBAR LAMINECTOMY/DECOMPRESSION MICRODISCECTOMY  05/31/2011   Procedure: LUMBAR LAMINECTOMY/DECOMPRESSION MICRODISCECTOMY;   Surgeon: Johnn Hai;  Location: [facility];  Service: Orthopedics;  Laterality: Left;  Decompression Lumbar four to five and  Lumbar five to Sacral one on Left  (X-Ray)  . MASTECTOMY W/ SENTINEL NODE BIOPSY Right 09/16/2018   Procedure: RIGHT MASTECTOMY WITH RIGHT AXILLARY SENTINEL LYMPH NODE BIOPSY;  Surgeon: Alphonsa Overall, MD;  Location: [place];  Service: General;  Laterality: Right;  . PARTIAL HYSTERECTOMY  1982  . TONSILLECTOMY     as child  . TOTAL KNEE ARTHROPLASTY Left 12/18/2005    FAMILY HISTORY Family History  Problem Relation Age of Onset  . Anesthesia problems Mother   . Heart disease Mother        CHF, atrial fib  . Heart failure Mother   . Anesthesia problems Sister   . Colon polyps Father   . Heart disease Father        "Fluid around the heart"  . Hypertension Sister   . Breast cancer Maternal Aunt   . Colon cancer Maternal Aunt   . Prostate cancer Neg Hx   . Ovarian cancer Neg Hx    As of February 2019, patient father is alive at 29 years old. Patient mother is alive at 73 years old. She notes a family hx of breast cancer. A maternal aunt was diagnosed with breast cancer, but Doris Smith is unsure if it was in one or both breasts. She has 3 siblings, 3 sisters and 0 brothers.  GYNECOLOGIC HISTORY:  No LMP recorded. Patient is postmenopausal. Menarche: 68 years  old Age at first live birth: 68 years old [place] P 2 LMP about 42 years ago Contraceptive no HRT no  Hysterectomy? partial So? no   SOCIAL HISTORY: Doris Smith worked in [employer] for 15 years. She is on disability because her back, knees, and nerves. Her husband, Doris Smith, has been at [employer] 43 years as a Freight forwarder.  Even though their home is in [place] they are actually living in [place] in an apartment while her husband works there.  Son Doris Smith, age 62, works as a Chief Strategy Officer in [place], [place]. Son Doris Smith, age 11, works as a Building control surveyor in [place], [place]. They have 4 grandchildren, 2 great-grandchildren with 2 more  on the way. She attends a [employer].     ADVANCED DIRECTIVES:    HEALTH MAINTENANCE: Social History   Tobacco Use  . Smoking status: Never Smoker  . Smokeless tobacco: Never Used  Substance Use Topics  . Alcohol use: No  . Drug use: No     Colonoscopy: 2019  PAP: 11/03/2014, normal  Bone density: 10/13/2016, T-score of -2.1   Allergies  Allergen Reactions  . Iohexol     "over 20 years ago" had reaction "hurting in arm and heart"-Done in [place], [place].Marland Kitchenphysician stopped CT at that time.  . Codeine Other (See Comments)    Makes feel like she is wild   . Prednisone Other (See Comments)    Makes me very irritable  . Sulfonamide Derivatives Other (See Comments)    Upset stomach  . Celecoxib Nausea And Vomiting  . Sulfa Antibiotics Nausea And Vomiting  . Vitamin B12 Rash    Current Outpatient Medications  Medication Sig Dispense Refill  . anastrozole (ARIMIDEX) 1 MG tablet Take 1 tablet (1 mg total) by mouth daily. 90 tablet 4  . benzonatate (TESSALON PERLES) 100 MG capsule Take 1 capsule (100 mg total) by mouth 3 (three) times daily as needed for cough. (Patient not taking: Reported on 09/09/2018) 20 capsule 0  . clotrimazole-betamethasone (LOTRISONE) cream Apply 1 application topically 2 (two) times daily. (Patient taking differently: Apply 1 application topically 2 (two) times daily as needed (rash). ) 30 g 0  . dexamethasone (DECADRON) 4 MG tablet Take 1 tablet (4 mg total) by mouth daily. Start the day after chemotherapy for 2 days. Take with food. 30 tablet 1  . esomeprazole (NEXIUM) 40 MG capsule TAKE 1 CAPSULE BY MOUTH ONCE DAILY 30 MINUTES BEFORE BREAKFAST 30 capsule 0  . gabapentin (NEURONTIN) 300 MG capsule Take 1 capsule (300 mg total) by mouth 3 (three) times daily. (Patient taking differently: Take 300 mg by mouth 2 (two) times daily. ) 90 capsule 5  . HYDROcodone-acetaminophen (NORCO/VICODIN) 5-325 MG tablet Take 1 tablet by mouth every 6 (six) hours as needed for  moderate pain. 12 tablet 0  . lamoTRIgine (LAMICTAL) 100 MG tablet Take 100 mg by mouth 2 (two) times daily.     Marland Kitchen lidocaine-prilocaine (EMLA) cream Apply to affected area once 30 g 3  . loratadine (CLARITIN) 10 MG tablet Take 10 mg by mouth daily.    Marland Kitchen LORazepam (ATIVAN) 0.5 MG tablet Take 1 tablet (0.5 mg total) by mouth 2 (two) times daily as needed. 30 tablet 2  . LORazepam (ATIVAN) 0.5 MG tablet Take 1 tablet (0.5 mg total) by mouth at bedtime as needed (Nausea or vomiting). 30 tablet 0  . montelukast (SINGULAIR) 10 MG tablet Take 10 mg by mouth at bedtime.    . prochlorperazine (COMPAZINE) 10 MG  tablet Take 1 tablet (10 mg total) by mouth every 6 (six) hours as needed (Nausea or vomiting). 30 tablet 1  . traZODone (DESYREL) 50 MG tablet Take 25 mg by mouth at bedtime as needed for sleep.     . Vilazodone HCl (VIIBRYD) 40 MG TABS Take 40 mg by mouth daily.     No current facility-administered medications for this visit.     OBJECTIVE: Middle-aged white woman in no acute distress  Vitals:   10/14/18 0807  BP: (!) 142/60  Pulse: 67  Resp: 18  Temp: 98.6 F (37 C)  SpO2: 100%     Body mass index is 35.78 kg/m.   Wt Readings from Last 3 Encounters:  10/14/18 215 lb (97.5 kg)  09/16/18 200 lb (90.7 kg)  09/12/18 209 lb 12.8 oz (95.2 kg)      ECOG FS:1 - Symptomatic but completely ambulatory  Sclerae unicteric, EOMs intact Oropharynx clear and moist No cervical or supraclavicular adenopathy Lungs no rales or rhonchi Heart regular rate and rhythm Abd soft, nontender, positive bowel sounds MSK no focal spinal tenderness, no upper extremity lymphedema Neuro: nonfocal, well oriented, appropriate affect Breasts: The right breast is status post mastectomy.  The incision is healing very nicely, without dehiscence, erythema, or swelling.  The right upper chest is flat.  The left breast is benign.  Both axillae are benign.  LAB RESULTS:  CMP     Component Value Date/Time   NA  140 09/12/2018 0913   NA 142 10/13/2016 1045   K 5.2 (H) 09/12/2018 0913   CL 108 09/12/2018 0913   CO2 24 09/12/2018 0913   GLUCOSE 91 09/12/2018 0913   BUN 6 (L) 09/12/2018 0913   BUN 8 10/13/2016 1045   CREATININE 0.81 09/12/2018 0913   CREATININE 0.83 08/14/2018 0758   CALCIUM 8.6 (L) 09/12/2018 0913   PROT 6.6 09/12/2018 0913   PROT 6.3 10/13/2016 1045   ALBUMIN 3.4 (L) 09/12/2018 0913   ALBUMIN 3.9 10/13/2016 1045   AST 40 09/12/2018 0913   AST 14 (L) 08/14/2018 0758   ALT 6 09/12/2018 0913   ALT 10 08/14/2018 0758   ALKPHOS 93 09/12/2018 0913   BILITOT 1.5 (H) 09/12/2018 0913   BILITOT 0.5 08/14/2018 0758   GFRNONAA >60 09/12/2018 0913   GFRNONAA >60 08/14/2018 0758   GFRAA >60 09/12/2018 0913   GFRAA >60 08/14/2018 0758    No results found for: TOTALPROTELP, ALBUMINELP, A1GS, A2GS, BETS, BETA2SER, GAMS, MSPIKE, SPEI  No results found for: Nils Pyle, [facility]  Lab Results  Component Value Date   WBC 9.0 09/12/2018   NEUTROABS 4.4 08/14/2018   HGB 13.3 09/12/2018   HCT 41.4 09/12/2018   MCV 90.2 09/12/2018   PLT 377 09/12/2018    '@LASTCHEMISTRY' @  No results found for: LABCA2  No components found for: [id number]  No results for input(s): INR in the last 168 hours.  No results found for: LABCA2  No results found for: WUJ811  No results found for: BJY782  No results found for: NFA213  No results found for: CA2729  No components found for: HGQUANT  No results found for: CEA1 / No results found for: CEA1   No results found for: AFPTUMOR  No results found for: CHROMOGRNA  No results found for: PSA1  No visits with results within 3 Day(s) from this visit.  Latest known visit with results is:  Hospital Outpatient Visit on 09/12/2018  Component Date Value Ref Range Status  .  WBC 09/12/2018 9.0  4.0 - 10.5 K/uL Final  . RBC 09/12/2018 4.59  3.87 - 5.11 MIL/uL Final  . Hemoglobin 09/12/2018 13.3  12.0 - 15.0 g/dL Final  . HCT  09/12/2018 41.4  36.0 - 46.0 % Final  . MCV 09/12/2018 90.2  80.0 - 100.0 fL Final  . MCH 09/12/2018 29.0  26.0 - 34.0 pg Final  . MCHC 09/12/2018 32.1  30.0 - 36.0 g/dL Final  . RDW 09/12/2018 13.3  11.5 - 15.5 % Final  . Platelets 09/12/2018 377  150 - 400 K/uL Final  . nRBC 09/12/2018 0.0  0.0 - 0.2 % Final   Performed at [facility], [place] [address] [place], [place] [address]  . Sodium 09/12/2018 140  135 - 145 mmol/L Final  . Potassium 09/12/2018 5.2* 3.5 - 5.1 mmol/L Final  . Chloride 09/12/2018 108  98 - 111 mmol/L Final  . CO2 09/12/2018 24  22 - 32 mmol/L Final  . Glucose, Bld 09/12/2018 91  70 - 99 mg/dL Final  . BUN 09/12/2018 6* 8 - 23 mg/dL Final  . Creatinine, Ser 09/12/2018 0.81  0.44 - 1.00 mg/dL Final  . Calcium 09/12/2018 8.6* 8.9 - 10.3 mg/dL Final  . Total Protein 09/12/2018 6.6  6.5 - 8.1 g/dL Final  . Albumin 09/12/2018 3.4* 3.5 - 5.0 g/dL Final  . AST 09/12/2018 40  15 - 41 U/L Final  . ALT 09/12/2018 6  0 - 44 U/L Final  . Alkaline Phosphatase 09/12/2018 93  38 - 126 U/L Final  . Total Bilirubin 09/12/2018 1.5* 0.3 - 1.2 mg/dL Final  . GFR calc non Af Amer 09/12/2018 >60  >60 mL/min Final  . GFR calc Af Amer 09/12/2018 >60  >60 mL/min Final  . Anion gap 09/12/2018 8  5 - 15 Final   Performed at [facility], [place] [address] [place], [place] [address]    (this displays the last labs from the last 3 days)  No results found for: TOTALPROTELP, ALBUMINELP, A1GS, A2GS, BETS, BETA2SER, GAMS, MSPIKE, SPEI (this displays SPEP labs)  No results found for: KPAFRELGTCHN, LAMBDASER, KAPLAMBRATIO (kappa/lambda light chains)  No results found for: HGBA, HGBA2QUANT, HGBFQUANT, HGBSQUAN (Hemoglobinopathy evaluation)   No results found for: LDH  No results found for: IRON, TIBC, IRONPCTSAT (Iron and TIBC)  No results found for: FERRITIN  Urinalysis    Component Value Date/Time   COLORURINE YELLOW 05/25/2011 0923   APPEARANCEUR Clear 05/03/2018 1204    LABSPEC 1.011 05/25/2011 0923   PHURINE 7.0 05/25/2011 0923   GLUCOSEU Negative 05/03/2018 [place] 05/25/2011 0923   BILIRUBINUR Negative 05/03/2018 [place] 05/25/2011 0923   PROTEINUR Negative 05/03/2018 [place] 05/25/2011 0923   UROBILINOGEN negative 11/03/2014 1039   UROBILINOGEN 0.2 05/25/2011 0923   NITRITE Negative 05/03/2018 1204   NITRITE NEGATIVE 05/25/2011 0923   LEUKOCYTESUR Negative 05/03/2018 1204     STUDIES: Nm Sentinel Node Inj-no Rpt (breast)  Result Date: 09/16/2018 Sulfur colloid was injected by the nuclear medicine technologist for melanoma sentinel node.    ELIGIBLE FOR AVAILABLE RESEARCH PROTOCOL: no  ASSESSMENT: 68 y.o. [place], [place] woman status post right breast biopsy 08/02/2018 for a clinical T3 N0, stage IIA invasive lobular carcinoma, grade 2, estrogen receptor strongly positive, progesterone receptor 1% positive, with no HER-2 amplification and an MIB-1 of 1%.  (a) CT scan of the head and chest, without contrast 08/23/2018 showed nonspecific 0.4 cm  left lower lobe pulmonary nodule, no definitive metastatic disease  (b) bone scan 08/23/2018 shows multiple spinal areas of abnormal uptake, but  (c) total spinal MRI 09/03/2018 finds bone scan findings to be secondary to degenerative disease, no evidence of metastatic disease.  (1) status post right mastectomy and sentinel lymph node sampling 09/16/2018 showing a pT3 pN1(mic), stage IIA-IIIA invasive lobular breast cancer, grade 2, with 1 of 5 sampled lymph nodes involved by micrometastatic deposit, with extra nodular extension; ample margins  (2)  MammaPrint "high risk" suggests a 5-year metastasis free survival of 93% with chemotherapy, with an absolute chemotherapy benefit in the greater than 12% range  (3) adjuvant radiation to follow (can be done concurrently with chemotherapy)  (4) anastrozole started neoadjuvantly (on 08/14/2018), discontinued  10/14/2018 in preparation for chemotherapy  (5) Caris requested 10/14/2018  (6) cyclophosphamide, methotrexate, fluorouracil (CMF) to start 11/05/2018, repeated every 21 days x 8  PLAN: I spent approximately 40 minutes face to face with Nykeria with more than 50% of that time spent in counseling and coordination of care.  We reviewed her pathology report, her staging scans, and her MammaPrint report.  She understands that lobular breast cancer is an unusual subtype, and that in general it tends to respond less vigorously to chemotherapy.  It also tends to spread in unusual ways and can be very difficult to detect, even with the scans that she has had.  She certainly has a very high risk of cold metastatic spread at this time.  Instead of brief intense chemotherapy what I prefer for lobular breast cancers like hers is less intense but prolonged chemotherapy namely CMF.  This is in line with the tumor's very low growth fraction.  We discussed the possible toxicities side effects and complications of CMF chemotherapy in detail.  We are going to check nadir counts after cycle #1, but not thereafter to minimize exposure in this pandemic time.  Once she has had 4 cycles, likely she will start adjuvant radiation concurrently and we will hold the methotrexate during radiation.  Once she completes the chemotherapy and radiation she will go back on anastrozole which she tolerated generally well.  We discussed the fact that vilazodone and trazodone are very similar and if she is going to be taking the vilazodone she should stop the trazodone.  I also encouraged her to continue to exercise as much as possible.  Finally we discussed diet issues and generally she keeps a good diet although she has a tendency to do "depression eating".  She will have her port placed 10/31/2018.  She will have her first chemotherapy 11/05/2018 and she will see me that day  She knows to call for any other issues that may develop  before that visit.   Chauncey Cruel, MD   10/14/2018 8:43 AM Medical Oncology and Hematology [facility] [address] [place],  [address] Tel. [phone]    Fax. [phone]  This document serves as a record of services personally performed by Lurline Del, MD. It was created on his behalf by Wilburn Mylar, a trained medical scribe. The creation of this record is based on the scribe's personal observations and the provider's statements to them.   I, Lurline Del MD, have reviewed the above documentation for accuracy and completeness, and I agree with the above.

## 2018-10-14 ENCOUNTER — Inpatient Hospital Stay: Payer: BLUE CROSS/BLUE SHIELD | Attending: Oncology | Admitting: Oncology

## 2018-10-14 ENCOUNTER — Telehealth: Payer: Self-pay | Admitting: Oncology

## 2018-10-14 ENCOUNTER — Other Ambulatory Visit: Payer: Self-pay

## 2018-10-14 VITALS — BP 142/60 | HR 67 | Temp 98.6°F | Resp 18 | Ht 65.0 in | Wt 215.0 lb

## 2018-10-14 DIAGNOSIS — C50411 Malignant neoplasm of upper-outer quadrant of right female breast: Secondary | ICD-10-CM | POA: Diagnosis not present

## 2018-10-14 DIAGNOSIS — Z5111 Encounter for antineoplastic chemotherapy: Secondary | ICD-10-CM | POA: Insufficient documentation

## 2018-10-14 DIAGNOSIS — Z171 Estrogen receptor negative status [ER-]: Secondary | ICD-10-CM

## 2018-10-14 DIAGNOSIS — C50911 Malignant neoplasm of unspecified site of right female breast: Secondary | ICD-10-CM

## 2018-10-14 DIAGNOSIS — Z9011 Acquired absence of right breast and nipple: Secondary | ICD-10-CM

## 2018-10-14 DIAGNOSIS — Z170 Estrogen receptor positive status [ER+]: Secondary | ICD-10-CM

## 2018-10-14 DIAGNOSIS — C50211 Malignant neoplasm of upper-inner quadrant of right female breast: Secondary | ICD-10-CM | POA: Diagnosis not present

## 2018-10-14 DIAGNOSIS — R911 Solitary pulmonary nodule: Secondary | ICD-10-CM | POA: Insufficient documentation

## 2018-10-14 DIAGNOSIS — Z79899 Other long term (current) drug therapy: Secondary | ICD-10-CM | POA: Diagnosis not present

## 2018-10-14 MED ORDER — DEXAMETHASONE 4 MG PO TABS: 4.0000 mg | ORAL_TABLET | Freq: Every day | ORAL | 1 refills | Status: DC

## 2018-10-14 MED ORDER — PROCHLORPERAZINE MALEATE 10 MG PO TABS: 10.0000 mg | ORAL_TABLET | Freq: Four times a day (QID) | ORAL | 1 refills | Status: DC | PRN

## 2018-10-14 MED ORDER — LORAZEPAM 0.5 MG PO TABS: 0.5000 mg | ORAL_TABLET | Freq: Every evening | ORAL | 0 refills | Status: DC | PRN

## 2018-10-14 MED ORDER — LIDOCAINE-PRILOCAINE 2.5-2.5 % EX CREA: TOPICAL_CREAM | CUTANEOUS | 3 refills | Status: DC

## 2018-10-14 NOTE — Telephone Encounter (Signed)
Called regarding 4/28 °

## 2018-10-14 NOTE — Progress Notes (Signed)
START OFF PATHWAY REGIMEN - Breast   [id number]:CMF (IV cyclophosphamide) q21 days:   A cycle is every 21 days:     Cyclophosphamide      Methotrexate      5-Fluorouracil   **Always confirm dose/schedule in your pharmacy ordering system**  Patient Characteristics: Postoperative without Neoadjuvant Therapy (Pathologic Staging), Invasive Disease, Adjuvant Therapy, HER2 Negative/Unknown/Equivocal, ER Positive, Node Positive, Node Positive (1-3), MammaPrint(R) Ordered, High Genomic Risk Therapeutic Status: Postoperative without Neoadjuvant Therapy (Pathologic Staging) AJCC Grade: G2 AJCC N Category: pN1 AJCC M Category: cM0 ER Status: Positive (+) AJCC 8 Stage Grouping: IIIA HER2 Status: Negative (-) Oncotype Dx Recurrence Score: Ordered Other Genomic Test AJCC T Category: pT3 PR Status: Negative (-) Has this patient completed genomic testing<= Yes - MammaPrint(R) MammaPrint(R) Score: High Genomic Risk Intent of Therapy: Curative Intent, Discussed with Patient

## 2018-10-16 DIAGNOSIS — F4310 Post-traumatic stress disorder, unspecified: Secondary | ICD-10-CM | POA: Diagnosis not present

## 2018-10-16 DIAGNOSIS — F312 Bipolar disorder, current episode manic severe with psychotic features: Secondary | ICD-10-CM | POA: Diagnosis not present

## 2018-10-16 DIAGNOSIS — F410 Panic disorder [episodic paroxysmal anxiety] without agoraphobia: Secondary | ICD-10-CM | POA: Diagnosis not present

## 2018-10-17 ENCOUNTER — Encounter: Payer: Self-pay | Admitting: *Deleted

## 2018-10-21 ENCOUNTER — Other Ambulatory Visit: Payer: Self-pay | Admitting: Nurse Practitioner

## 2018-10-21 ENCOUNTER — Other Ambulatory Visit: Payer: Self-pay

## 2018-10-21 ENCOUNTER — Encounter: Payer: Self-pay | Admitting: Nurse Practitioner

## 2018-10-21 ENCOUNTER — Ambulatory Visit (INDEPENDENT_AMBULATORY_CARE_PROVIDER_SITE_OTHER): Payer: BLUE CROSS/BLUE SHIELD | Admitting: Nurse Practitioner

## 2018-10-21 ENCOUNTER — Encounter (HOSPITAL_BASED_OUTPATIENT_CLINIC_OR_DEPARTMENT_OTHER): Payer: Self-pay | Admitting: *Deleted

## 2018-10-21 DIAGNOSIS — R05 Cough: Secondary | ICD-10-CM

## 2018-10-21 DIAGNOSIS — R059 Cough, unspecified: Secondary | ICD-10-CM

## 2018-10-21 MED ORDER — FLUTICASONE PROPIONATE 50 MCG/ACT NA SUSP: 2.0000 | Freq: Every day | NASAL | 6 refills | Status: DC

## 2018-10-21 MED ORDER — BENZONATATE 100 MG PO CAPS: 100.0000 mg | ORAL_CAPSULE | Freq: Three times a day (TID) | ORAL | 0 refills | Status: DC | PRN

## 2018-10-21 NOTE — Progress Notes (Signed)
Patient ID: Doris Smith, female   DOB: 1951/03/05, 68 y.o.   MRN: [id number]    Virtual Visit via telephone Note  I connected with Doris Smith on 10/21/18 at 2:50 PM by telephone and verified that I am speaking with the correct person using two identifiers. Doris Smith is currently located at home and her husband  is currently with her during visit. The provider, [name] Done, FNP is located in their office at time of visit.  I discussed the limitations, risks, security and privacy concerns of performing an evaluation and management service by telephone and the availability of in person appointments. I also discussed with the patient that there may be a patient responsible charge related to this service. The patient expressed understanding and agreed to proceed.   History and Present Illness:   Chief Complaint: Cough   HPI Patient calls in today c/o cough.  Started coughing about 2 months ago. Her allergies make it worse. Always feel  Like she has a tickle in her throat. Denies fever, headache and sob.     Review of Systems  Constitutional: Negative for chills and fever.  HENT: Negative.   Respiratory: Positive for cough. Negative for shortness of breath.   Cardiovascular: Negative.   Gastrointestinal: Negative.   Skin: Negative.   Neurological: Negative.   Psychiatric/Behavioral: Negative.   All other systems reviewed and are negative.    Observations/Objective: Alert and oriented- answers all questions appropriately No cough while talking on phone no distress noted.  Assessment and Plan: Doris Smith in today with chief complaint of Cough   1. Cough Meds ordered this encounter  Medications  . benzonatate (TESSALON PERLES) 100 MG capsule    Sig: Take 1 capsule (100 mg total) by mouth 3 (three) times daily as needed for cough.    Dispense:  20 capsule    Refill:  0    Order Specific Question:   Supervising Provider    Answer:    Caryl Pina A [id number]  . fluticasone (FLONASE) 50 MCG/ACT nasal spray    Sig: Place 2 sprays into both nostrils daily.    Dispense:  16 g    Refill:  6    Order Specific Question:   Supervising Provider    Answer:   Caryl Pina A [[id number]]   1. Take meds as prescribed 2. Use a cool mist humidifier especially during the winter months and when heat has been humid. 3. Use saline nose sprays frequently 4. Saline irrigations of the nose can be very helpful if done frequently.  * 4X daily for 1 week*  * Use of a nettie pot can be helpful with this. Follow directions with this* 5. Drink plenty of fluids 6. Keep thermostat turn down low 7.For any cough or congestion  Use plain Mucinex- regular strength or max strength is fine   * Children- consult with Pharmacist for dosing 8. For fever or aces or pains- take tylenol or ibuprofen appropriate for age and weight.  * for fevers greater than 101 orally you may alternate ibuprofen and tylenol every  3 hours.      Follow Up Instructions:  prn   I discussed the assessment and treatment plan with the patient. The patient was provided an opportunity to ask questions and all were answered. The patient agreed with the plan and demonstrated an understanding of the instructions.   The patient was advised to call back or seek an in-person evaluation if the symptoms worsen  or if the condition fails to improve as anticipated.  The above assessment and management plan was discussed with the patient. The patient verbalized understanding of and has agreed to the management plan. Patient is aware to call the clinic if symptoms persist or worsen. Patient is aware when to return to the clinic for a follow-up visit. Patient educated on when it is appropriate to go to the emergency department.    I provided 15 minutes of non-face-to-face time during this encounter.    [name] Done, FNP

## 2018-10-21 NOTE — Telephone Encounter (Signed)
Patient states she has cough- she denies any other symptoms such as fever.  States her allergies always cause a cough.  She requests tessalon pearles.  Scheduled her for telephone visit today at 5 pm with MMM.

## 2018-10-23 ENCOUNTER — Ambulatory Visit: Payer: Medicare Other | Admitting: Oncology

## 2018-10-24 DIAGNOSIS — Z87891 Personal history of nicotine dependence: Secondary | ICD-10-CM | POA: Diagnosis not present

## 2018-10-24 DIAGNOSIS — J309 Allergic rhinitis, unspecified: Secondary | ICD-10-CM | POA: Diagnosis not present

## 2018-10-24 DIAGNOSIS — J4550 Severe persistent asthma, uncomplicated: Secondary | ICD-10-CM | POA: Diagnosis not present

## 2018-10-29 NOTE — Progress Notes (Addendum)
Ensure pre surgery drink given with instructions to complete by Kake, surgical soap given with instructions, pt verbalized understanding.

## 2018-10-30 ENCOUNTER — Other Ambulatory Visit: Payer: Self-pay | Admitting: Surgery

## 2018-10-30 NOTE — H&P (View-Only) (Signed)
Doris Smith  Location: [facility] Patient #: [id number] DOB: 1950/08/16 Married / Language: English / Race: White Female   History of Present Illness  The patient is a 68 year old female who presents with a complaint of breast cancer.  The PCP is Ronnald Collum, FNP.  The pateint was seen at the Breast [facility] - Oncology is Drs. Magrinat and [place].  She comes wit her husband.  She underwent a right mastectomy and right axillary SLNBx on 09/16/2018. Pathology [id number]) - ILC, grade 2, 10.5 cm, 1/5 nodes. Her drainage is down. I removed her drain. She is doing well. Mammoprint report pending.   Plan: 1) Radiation to right chest wall, 2) Dr. Jana Hakim started anastrazole 3) Mammoprint test ordered - this came back high, so she'll need a power port. 4) See back in 6 months  Power port placement  - I discussed the indications and potential complications of the power port placement.  The primary complications of the power port, include, but are not limited to, bleeding, infection, nerve injury, thrombosis, and pneumothorax.  History of right breast cancer: She noticed an inversion of her right nipple over the last couple months. She thought her right breast was hard compared to her left breast. She thinks her last mammogram was about 2 years ago, but is somewhat unclear. She has no family history of breast cancer. She had a history of a hystrectomy at age 43 for bleeding. She was never placed on hormones.  [facility] - Interval diffuse distortion throughout heterogeneously dense glandular tissue in the right breast involving all 4 quadrants. This spans an area measuring approximately 9.7 x 9 3 x 5.0 cm. Ultrasound-guided core needle biopsies of the 2.7 cm mass in the 9 o'clock position of the right breast and 1.9 cm mass in the 1 o'clock position of the right breast. Biopsy: Right breast biopsy x 2,  9 o'clock and 1 o'clock - 08/02/2018 ([id number]) - IDC, grade 2-3, ER - 90%, PR - 1%, Ki67 - 1%, Her2Neu - neg  Past Medical History: 1. Right breast cancer  Right breast biopsy x 2, 9 o'clock and 1 o'clock - 08/02/2018 ([id number]) - IDC, grade 2-3, ER - 90%, PR - 1%, Ki67 - 1%, Her2Neu - neg  Right mastectomy and right axillary SLNBx on 09/16/2018. Pathology [id number]) - ILC, grade 2, 10.5 cm, 1/5 nodes (the 3 sentinel nodes were negative, the positive node was probably an intramammary node) She saw Dr. Iran Planas for consideration of reconstruction - but this was put on hold 2. Back surgery - 2015 - Dr. Tonita Cong She still has back pain 3. Left knee replacement - 2008 - [place] (her husband had open heart surgery around the same time) She is having right knee trouble now and will probably need a replacement on the right 4. HH with GERD Sees Dr. Silverio Decamp 5. IBS and fibromyalgia 6. colonoscopy in 2019  Social History: Married. Has two sons. She and her husband live in [place] - her sons live in [place] (April Staton, [place]; 10/02/2018 10:46 AM) Codeine Phosphate *ANALGESICS - OPIOID*  "makes her feel she is wild" per epic Sulfa Antibiotics  Nausea, Vomiting. Upset stomach Vitamin B 12 *HEMATOPOIETIC AGENTS*  Rash. Celecoxib *ANALGESICS - ANTI-INFLAMMATORY*  Nausea, Vomiting.  Medication History (April Staton, CMA; 10/02/2018 10:46 AM) Illusions C Breast Prosthesis (1 (one) as directed, Taken starting 09/10/2018) Active. (Mastectomy products) Esomeprazole Magnesium (40MG Capsule DR, Oral) Active. FLUoxetine HCl (40MG  Capsule, Oral) Active. Fluticasone Propionate (50MCG/ACT Suspension, Nasal) Active. Gabapentin (300MG Capsule, Oral) Active. lamoTRIgine (100MG Tablet, Oral) Active. Omeprazole (40MG Capsule DR, Oral) Active. traZODone HCl (50MG Tablet, Oral) Active. LORazepam (0.5MG Tablet, Oral)  Active. Medications Reconciled  Vitals (April Staton CMA; 10/02/2018 10:47 AM) 10/02/2018 10:46 AM Weight: 213 lb Height: 68in Body Surface Area: 2.1 m Body Mass Index: 32.39 kg/m  Temp.: 98.35F(Oral)  Pulse: 73 (Regular)  BP: 150/88 (Sitting, Left Arm, Standard)   Physical Exam  General: Older WF who is alert and generally healthy appearing. Skin: Inspection and palpation of the skin unremarkable.  Eyes: Conjunctivae white, pupils equal. Face, ears, nose, mouth, and throat: Face - normal. Normal ears and nose. Lips and teeth normal.  Neck: Supple. No mass. Trachea midline. No thyroid mass.  Lymph Nodes: No supraclavicular or cervical adenopathy. No axillary adenopathy.  Breasts: Right - right mastectomy incision looks good. I removed the right chest wall drain. Left - No mass or nodule.  Musculoskeletal/extremities: Good strength and ROM in upper and lower extremities.    Assessment & Plan  1.  BREAST CANCER, STAGE 2, RIGHT (C50.911)  Story: Right breast biopsy x 2 - 08/02/2018 ([id number]) - IDC, grade 2-3, ER - 90%, PR - 1%, Ki67 - 1%, Her2Neu - neg  She underwent a right mastectomy and right axillary SLNBx on 09/16/2018. Pathology [id number]) - ILC, grade 2, 10.5 cm, 1/5 nodes    Oncology - Magrinat and [place]  Plan:   1) Radiation to right chest wall,   2) Dr. Jana Hakim started anastrazole    3)  Mammoprint test ordered    Mammoprint - high risk     Addendum Note(Lanise Mergen H. Lucia Gaskins MD; 10/14/2018 9:35 AM)     Delila Spence!     We will not start treating ms Mo[place] until 4/28, so do not leave needle in when you out the port in on 4/23     Thanks!     GM  So plan power port placement.  Discussed with patient.   2. Back surgery - 2015 - Dr. Tonita Cong She still has back pain 3. Left knee replacement - 2008 - [place] (her husband had open heart surgery around the same time) She is having right knee trouble now and will probably need a  replacement on the right 4. HH with GERD Sees Dr. Silverio Decamp 5. IBS and fibromyalgia    Alphonsa Overall, MD, [facility] Pager: [phone] Office phone:  [phone]

## 2018-10-30 NOTE — Progress Notes (Signed)
Doris Smith  Location: [facility] Patient #: [id number] DOB: [birth year] Married / Language: English / Race: White Female   History of Present Illness  The patient is a 68 year old female who presents with a complaint of breast cancer.  The PCP is Doris Smith.  The pateint was seen at the Breast [facility] - Oncology is Doris Smith.  She comes wit her husband.  She underwent a right mastectomy and right axillary SLNBx on 09/16/2018. Pathology [id number]) - ILC, grade 2, 10.5 cm, 1/5 nodes. Her drainage is down. I removed her drain. She is doing well. Mammoprint report pending.   Plan: 1) Radiation to right chest wall, 2) Doris Smith started anastrazole 3) Mammoprint test ordered - this came back high, so she'll need a power port. 4) See back in 6 months  Power port placement  - I discussed the indications and potential complications of the power port placement.  The primary complications of the power port, include, but are not limited to, bleeding, infection, nerve injury, thrombosis, and pneumothorax.  History of right breast cancer: She noticed an inversion of her right nipple over the last couple months. She thought her right breast was hard compared to her left breast. She thinks her last mammogram was about 2 years ago, but is somewhat unclear. She has no family history of breast cancer. She had a history of a hystrectomy at age 84 for bleeding. She was never placed on hormones.  [facility]: [facility] - Interval diffuse distortion throughout heterogeneously dense glandular tissue in the right breast involving all 4 quadrants. This spans an area measuring approximately 9.7 x 9 3 x 5.0 cm. Ultrasound-guided core needle biopsies of the 2.7 cm mass in the 9 o'clock position of the right breast and 1.9 cm mass in the 1 o'clock position of the right breast. Biopsy: Right breast biopsy x 2,  9 o'clock and 1 o'clock - 08/02/2018 ([id number]) - IDC, grade 2-3, ER - 90%, PR - 1%, Ki67 - 1%, Her2Neu - neg  Past Medical History: 1. Right breast cancer  Right breast biopsy x 2, 9 o'clock and 1 o'clock - 08/02/2018 ([id number]) - IDC, grade 2-3, ER - 90%, PR - 1%, Ki67 - 1%, Her2Neu - neg  Right mastectomy and right axillary SLNBx on 09/16/2018. Pathology [id number]) - ILC, grade 2, 10.5 cm, 1/5 nodes (the 3 sentinel nodes were negative, the positive node was probably an intramammary node) She saw Doris Smith for consideration of reconstruction - but this was put on hold 2. Back surgery - 2015 - Dr. Tonita Smith She still has back pain 3. Left knee replacement - 2008 - Doris Smith (her husband had open heart surgery around the same time) She is having right knee trouble now and will probably need a replacement on the right 4. HH with GERD Sees Doris Smith 5. IBS and fibromyalgia 6. colonoscopy in 2019  Social History: Married. Has two sons. She and her husband live in [place] - her sons live in [place] (Doris Smith, [place]; 10/02/2018 10:46 AM) Codeine Phosphate *ANALGESICS - OPIOID*  "makes her feel she is wild" per epic Sulfa Antibiotics  Nausea, Vomiting. Upset stomach Vitamin B 12 *HEMATOPOIETIC AGENTS*  Rash. Celecoxib *ANALGESICS - ANTI-INFLAMMATORY*  Nausea, Vomiting.  Medication History (Doris Smith, CMA; 10/02/2018 10:46 AM) Illusions C Breast Prosthesis (1 (one) as directed, Taken starting 09/10/2018) Active. (Mastectomy products) Esomeprazole Magnesium (40MG Capsule DR, Oral) Active. FLUoxetine HCl (40MG  Capsule, Oral) Active. Fluticasone Propionate (50MCG/ACT Suspension, Nasal) Active. Gabapentin (300MG Capsule, Oral) Active. lamoTRIgine (100MG Tablet, Oral) Active. Omeprazole (40MG Capsule DR, Oral) Active. traZODone HCl (50MG Tablet, Oral) Active. LORazepam (0.5MG Tablet, Oral)  Active. Medications Reconciled  Vitals (Doris Smith CMA; 10/02/2018 10:47 AM) 10/02/2018 10:46 AM Weight: 213 lb Height: 68in Body Surface Area: 2.1 m Body Mass Index: 32.39 kg/m  Temp.: 98.71F(Oral)  Pulse: 73 (Regular)  BP: 150/88 (Sitting, Left Arm, Standard)   Physical Exam  General: Older WF who is alert and generally healthy appearing. Skin: Inspection and palpation of the skin unremarkable.  Eyes: Conjunctivae white, pupils equal. Face, ears, nose, mouth, and throat: Face - normal. Normal ears and nose. Lips and teeth normal.  Neck: Supple. No mass. Trachea midline. No thyroid mass.  Lymph Nodes: No supraclavicular or cervical adenopathy. No axillary adenopathy.  Breasts: Right - right mastectomy incision looks good. I removed the right chest wall drain. Left - No mass or nodule.  Musculoskeletal/extremities: Good strength and ROM in upper and lower extremities.    Assessment & Plan  1.  BREAST CANCER, STAGE 2, RIGHT (C50.911)  Story: Right breast biopsy x 2 - 08/02/2018 ([id number]) - IDC, grade 2-3, ER - 90%, PR - 1%, Ki67 - 1%, Her2Neu - neg  She underwent a right mastectomy and right axillary SLNBx on 09/16/2018. Pathology [id number]) - ILC, grade 2, 10.5 cm, 1/5 nodes    Oncology - Doris and Moody  Plan:   1) Radiation to right chest wall,   2) Doris Smith started anastrazole    3)  Mammoprint test ordered    Mammoprint - high risk     Addendum Note(Doris Smith; 10/14/2018 9:35 AM)     Doris Smith!     We will not start treating [name] until 4/28, so do not leave needle in when you out the port in on 4/23     Thanks!     Doris Smith  So plan power port placement.  Discussed with patient.   2. Back surgery - 2015 - Dr. Tonita Smith She still has back pain 3. Left knee replacement - 2008 - Doris Smith (her husband had open heart surgery around the same time) She is having right knee trouble now and will probably need a  replacement on the right 4. HH with GERD Sees Doris Smith 5. IBS and fibromyalgia    Doris Overall, Smith, [facility] Pager: [phone] Office phone:  [phone]

## 2018-10-30 NOTE — Pre-Procedure Instructions (Signed)
UTR pt In attempt to re screen for s/s of Covid-19 virus. VM full.

## 2018-10-31 ENCOUNTER — Ambulatory Visit (HOSPITAL_COMMUNITY): Payer: BLUE CROSS/BLUE SHIELD

## 2018-10-31 ENCOUNTER — Encounter (HOSPITAL_BASED_OUTPATIENT_CLINIC_OR_DEPARTMENT_OTHER)
Admission: RE | Disposition: A | Discharge status: Home / Self Care | Payer: Self-pay | Source: Home / Self Care | Attending: Surgery

## 2018-10-31 ENCOUNTER — Ambulatory Visit (HOSPITAL_BASED_OUTPATIENT_CLINIC_OR_DEPARTMENT_OTHER): Payer: BLUE CROSS/BLUE SHIELD | Admitting: Anesthesiology

## 2018-10-31 ENCOUNTER — Ambulatory Visit (HOSPITAL_BASED_OUTPATIENT_CLINIC_OR_DEPARTMENT_OTHER)
Admission: RE | Admit: 2018-10-31 | Discharge: 2018-10-31 | Disposition: A | Discharge status: Home / Self Care | Payer: BLUE CROSS/BLUE SHIELD | Attending: Surgery | Admitting: Surgery

## 2018-10-31 DIAGNOSIS — Z888 Allergy status to other drugs, medicaments and biological substances status: Secondary | ICD-10-CM | POA: Diagnosis not present

## 2018-10-31 DIAGNOSIS — F319 Bipolar disorder, unspecified: Secondary | ICD-10-CM | POA: Diagnosis not present

## 2018-10-31 DIAGNOSIS — Z96652 Presence of left artificial knee joint: Secondary | ICD-10-CM | POA: Diagnosis not present

## 2018-10-31 DIAGNOSIS — J45909 Unspecified asthma, uncomplicated: Secondary | ICD-10-CM | POA: Insufficient documentation

## 2018-10-31 DIAGNOSIS — G709 Myoneural disorder, unspecified: Secondary | ICD-10-CM | POA: Diagnosis not present

## 2018-10-31 DIAGNOSIS — R51 Headache: Secondary | ICD-10-CM | POA: Diagnosis not present

## 2018-10-31 DIAGNOSIS — C50211 Malignant neoplasm of upper-inner quadrant of right female breast: Secondary | ICD-10-CM | POA: Diagnosis not present

## 2018-10-31 DIAGNOSIS — M797 Fibromyalgia: Secondary | ICD-10-CM | POA: Diagnosis not present

## 2018-10-31 DIAGNOSIS — M549 Dorsalgia, unspecified: Secondary | ICD-10-CM | POA: Insufficient documentation

## 2018-10-31 DIAGNOSIS — Z882 Allergy status to sulfonamides status: Secondary | ICD-10-CM | POA: Insufficient documentation

## 2018-10-31 DIAGNOSIS — K449 Diaphragmatic hernia without obstruction or gangrene: Secondary | ICD-10-CM | POA: Diagnosis not present

## 2018-10-31 DIAGNOSIS — K589 Irritable bowel syndrome without diarrhea: Secondary | ICD-10-CM | POA: Diagnosis not present

## 2018-10-31 DIAGNOSIS — Z885 Allergy status to narcotic agent status: Secondary | ICD-10-CM | POA: Diagnosis not present

## 2018-10-31 DIAGNOSIS — G4730 Sleep apnea, unspecified: Secondary | ICD-10-CM | POA: Diagnosis not present

## 2018-10-31 DIAGNOSIS — F419 Anxiety disorder, unspecified: Secondary | ICD-10-CM | POA: Diagnosis not present

## 2018-10-31 DIAGNOSIS — Z95828 Presence of other vascular implants and grafts: Secondary | ICD-10-CM

## 2018-10-31 DIAGNOSIS — I4891 Unspecified atrial fibrillation: Secondary | ICD-10-CM | POA: Diagnosis not present

## 2018-10-31 DIAGNOSIS — Z419 Encounter for procedure for purposes other than remedying health state, unspecified: Secondary | ICD-10-CM

## 2018-10-31 DIAGNOSIS — C50911 Malignant neoplasm of unspecified site of right female breast: Secondary | ICD-10-CM | POA: Insufficient documentation

## 2018-10-31 DIAGNOSIS — I10 Essential (primary) hypertension: Secondary | ICD-10-CM | POA: Diagnosis not present

## 2018-10-31 DIAGNOSIS — C50411 Malignant neoplasm of upper-outer quadrant of right female breast: Secondary | ICD-10-CM | POA: Diagnosis not present

## 2018-10-31 DIAGNOSIS — M1990 Unspecified osteoarthritis, unspecified site: Secondary | ICD-10-CM | POA: Insufficient documentation

## 2018-10-31 DIAGNOSIS — Z170 Estrogen receptor positive status [ER+]: Secondary | ICD-10-CM | POA: Diagnosis not present

## 2018-10-31 DIAGNOSIS — Z79899 Other long term (current) drug therapy: Secondary | ICD-10-CM | POA: Insufficient documentation

## 2018-10-31 DIAGNOSIS — K219 Gastro-esophageal reflux disease without esophagitis: Secondary | ICD-10-CM | POA: Diagnosis not present

## 2018-10-31 HISTORY — PX: PORTACATH PLACEMENT: SHX2246

## 2018-10-31 SURGERY — INSERTION, TUNNELED CENTRAL VENOUS DEVICE, WITH PORT
Anesthesia: General | Laterality: Left

## 2018-10-31 MED ORDER — PROMETHAZINE HCL 25 MG/ML IJ SOLN: 6.2500 mg | INTRAMUSCULAR | Status: DC | PRN

## 2018-10-31 MED ORDER — GABAPENTIN 300 MG PO CAPS
ORAL_CAPSULE | ORAL | Status: AC
  Filled 2018-10-31: qty 1

## 2018-10-31 MED ORDER — MIDAZOLAM HCL 2 MG/2ML IJ SOLN
INTRAMUSCULAR | Status: AC
  Filled 2018-10-31: qty 2

## 2018-10-31 MED ORDER — DEXAMETHASONE SODIUM PHOSPHATE 10 MG/ML IJ SOLN
INTRAMUSCULAR | Status: AC
  Filled 2018-10-31: qty 1

## 2018-10-31 MED ORDER — OXYCODONE HCL 5 MG PO TABS: 5.0000 mg | ORAL_TABLET | Freq: Once | ORAL | Status: DC | PRN

## 2018-10-31 MED ORDER — GABAPENTIN 300 MG PO CAPS
300.0000 mg | ORAL_CAPSULE | ORAL | Status: AC
  Administered 2018-10-31: 09:00:00 300 mg via ORAL

## 2018-10-31 MED ORDER — OXYCODONE HCL 5 MG/5ML PO SOLN: 5.0000 mg | Freq: Once | ORAL | Status: DC | PRN

## 2018-10-31 MED ORDER — CHLORHEXIDINE GLUCONATE CLOTH 2 % EX PADS: 6.0000 | MEDICATED_PAD | Freq: Once | CUTANEOUS | Status: DC

## 2018-10-31 MED ORDER — CEFAZOLIN SODIUM-DEXTROSE 2-4 GM/100ML-% IV SOLN
2.0000 g | INTRAVENOUS | Status: AC
  Administered 2018-10-31: 10:00:00 2 g via INTRAVENOUS

## 2018-10-31 MED ORDER — HEPARIN SOD (PORK) LOCK FLUSH 100 UNIT/ML IV SOLN
INTRAVENOUS | Status: DC | PRN
  Administered 2018-10-31: 400 [IU]

## 2018-10-31 MED ORDER — ARTIFICIAL TEARS OPHTHALMIC OINT
TOPICAL_OINTMENT | OPHTHALMIC | Status: AC
  Filled 2018-10-31: qty 3.5

## 2018-10-31 MED ORDER — LIDOCAINE 2% (20 MG/ML) 5 ML SYRINGE
INTRAMUSCULAR | Status: DC | PRN
  Administered 2018-10-31: 80 mg via INTRAVENOUS

## 2018-10-31 MED ORDER — SCOPOLAMINE 1 MG/3DAYS TD PT72: 1.0000 | MEDICATED_PATCH | Freq: Once | TRANSDERMAL | Status: DC | PRN

## 2018-10-31 MED ORDER — HEPARIN (PORCINE) IN NACL 2-0.9 UNITS/ML
INTRAMUSCULAR | Status: AC | PRN
  Administered 2018-10-31: 20 mL

## 2018-10-31 MED ORDER — LIDOCAINE 2% (20 MG/ML) 5 ML SYRINGE
INTRAMUSCULAR | Status: AC
  Filled 2018-10-31: qty 10

## 2018-10-31 MED ORDER — ONDANSETRON HCL 4 MG/2ML IJ SOLN
INTRAMUSCULAR | Status: DC | PRN
  Administered 2018-10-31: 4 mg via INTRAVENOUS

## 2018-10-31 MED ORDER — FENTANYL CITRATE (PF) 100 MCG/2ML IJ SOLN
50.0000 ug | INTRAMUSCULAR | Status: AC | PRN
  Administered 2018-10-31: 25 ug via INTRAVENOUS
  Administered 2018-10-31: 50 ug via INTRAVENOUS
  Administered 2018-10-31: 25 ug via INTRAVENOUS

## 2018-10-31 MED ORDER — ACETAMINOPHEN 500 MG PO TABS
1000.0000 mg | ORAL_TABLET | ORAL | Status: AC
  Administered 2018-10-31: 09:00:00 1000 mg via ORAL

## 2018-10-31 MED ORDER — CEFAZOLIN SODIUM-DEXTROSE 2-4 GM/100ML-% IV SOLN
INTRAVENOUS | Status: AC
  Filled 2018-10-31: qty 100

## 2018-10-31 MED ORDER — MIDAZOLAM HCL 2 MG/2ML IJ SOLN
1.0000 mg | INTRAMUSCULAR | Status: DC | PRN
  Administered 2018-10-31: 10:00:00 2 mg via INTRAVENOUS

## 2018-10-31 MED ORDER — PROPOFOL 10 MG/ML IV BOLUS
INTRAVENOUS | Status: AC
  Filled 2018-10-31: qty 20

## 2018-10-31 MED ORDER — PROPOFOL 10 MG/ML IV BOLUS
INTRAVENOUS | Status: DC | PRN
  Administered 2018-10-31: 170 mg via INTRAVENOUS

## 2018-10-31 MED ORDER — MEPERIDINE HCL 25 MG/ML IJ SOLN: 6.2500 mg | INTRAMUSCULAR | Status: DC | PRN

## 2018-10-31 MED ORDER — ACETAMINOPHEN 500 MG PO TABS
ORAL_TABLET | ORAL | Status: AC
  Filled 2018-10-31: qty 2

## 2018-10-31 MED ORDER — FENTANYL CITRATE (PF) 100 MCG/2ML IJ SOLN
INTRAMUSCULAR | Status: AC
  Filled 2018-10-31: qty 2

## 2018-10-31 MED ORDER — HYDROMORPHONE HCL 1 MG/ML IJ SOLN
0.2500 mg | INTRAMUSCULAR | Status: DC | PRN
  Administered 2018-10-31 (×2): 0.25 mg via INTRAVENOUS

## 2018-10-31 MED ORDER — DEXAMETHASONE SODIUM PHOSPHATE 10 MG/ML IJ SOLN
INTRAMUSCULAR | Status: DC | PRN
  Administered 2018-10-31: 5 mg via INTRAVENOUS

## 2018-10-31 MED ORDER — BUPIVACAINE-EPINEPHRINE 0.25% -1:200000 IJ SOLN
INTRAMUSCULAR | Status: DC | PRN
  Administered 2018-10-31: 16 mL

## 2018-10-31 MED ORDER — ONDANSETRON HCL 4 MG/2ML IJ SOLN
INTRAMUSCULAR | Status: AC
  Filled 2018-10-31: qty 2

## 2018-10-31 MED ORDER — LACTATED RINGERS IV SOLN
INTRAVENOUS | Status: DC
  Administered 2018-10-31: 09:00:00 via INTRAVENOUS

## 2018-10-31 MED ORDER — HYDROMORPHONE HCL 1 MG/ML IJ SOLN
INTRAMUSCULAR | Status: AC
  Filled 2018-10-31: qty 0.5

## 2018-10-31 SURGICAL SUPPLY — 46 items
BAG DECANTER FOR FLEXI CONT (MISCELLANEOUS) ×2 IMPLANT
BENZOIN TINCTURE PRP APPL 2/3 (GAUZE/BANDAGES/DRESSINGS) ×2 IMPLANT
BLADE SURG 15 STRL LF DISP TIS (BLADE) ×1 IMPLANT
BLADE SURG 15 STRL SS (BLADE) ×1
CHLORAPREP W/TINT 26 (MISCELLANEOUS) ×2 IMPLANT
CLEANER CAUTERY TIP 5X5 PAD (MISCELLANEOUS) ×1 IMPLANT
COVER BACK TABLE REUSABLE LG (DRAPES) ×2 IMPLANT
COVER MAYO STAND REUSABLE (DRAPES) ×2 IMPLANT
COVER PROBE 5X48 (MISCELLANEOUS)
COVER WAND RF STERILE (DRAPES) IMPLANT
DECANTER SPIKE VIAL GLASS SM (MISCELLANEOUS) IMPLANT
DERMABOND ADVANCED (GAUZE/BANDAGES/DRESSINGS) ×1
DERMABOND ADVANCED .7 DNX12 (GAUZE/BANDAGES/DRESSINGS) ×1 IMPLANT
DRAPE C-ARM 42X72 X-RAY (DRAPES) ×2 IMPLANT
DRAPE LAPAROTOMY T 102X78X121 (DRAPES) ×2 IMPLANT
DRAPE UTILITY XL STRL (DRAPES) ×2 IMPLANT
ELECT REM PT RETURN 9FT ADLT (ELECTROSURGICAL) ×2
ELECTRODE REM PT RTRN 9FT ADLT (ELECTROSURGICAL) ×1 IMPLANT
GAUZE SPONGE 4X4 12PLY STRL (GAUZE/BANDAGES/DRESSINGS) IMPLANT
GAUZE SPONGE 4X4 12PLY STRL LF (GAUZE/BANDAGES/DRESSINGS) ×4 IMPLANT
GLOVE SURG SIGNA 7.5 PF LTX (GLOVE) ×2 IMPLANT
GOWN STRL REUS W/ TWL LRG LVL3 (GOWN DISPOSABLE) ×1 IMPLANT
GOWN STRL REUS W/ TWL XL LVL3 (GOWN DISPOSABLE) ×1 IMPLANT
GOWN STRL REUS W/TWL LRG LVL3 (GOWN DISPOSABLE) ×1
GOWN STRL REUS W/TWL XL LVL3 (GOWN DISPOSABLE) ×1
IV CATH AUTO 14GX1.75 SAFE ORG (IV SOLUTION) IMPLANT
IV CATH PLACEMENT UNIT 16 GA (IV SOLUTION) IMPLANT
IV CONNECTOR ONE LINK NDLESS (IV SETS) ×2 IMPLANT
IV KIT MINILOC 20X1 SAFETY (NEEDLE) IMPLANT
KIT CVR 48X5XPRB PLUP LF (MISCELLANEOUS) IMPLANT
KIT PORT POWER 8FR ISP CVUE (Port) ×2 IMPLANT
NEEDLE HYPO 25X1 1.5 SAFETY (NEEDLE) ×2 IMPLANT
PACK BASIN DAY SURGERY FS (CUSTOM PROCEDURE TRAY) ×2 IMPLANT
PAD CLEANER CAUTERY TIP 5X5 (MISCELLANEOUS) ×1
PENCIL BUTTON HOLSTER BLD 10FT (ELECTRODE) ×2 IMPLANT
SET SHEATH INTRODUCER 10FR (MISCELLANEOUS) IMPLANT
SHEATH COOK PEEL AWAY SET 9F (SHEATH) IMPLANT
SLEEVE SCD COMPRESS KNEE MED (MISCELLANEOUS) ×2 IMPLANT
STRIP CLOSURE SKIN 1/4X4 (GAUZE/BANDAGES/DRESSINGS) ×2 IMPLANT
SUT ETHILON 2 0 FS 18 (SUTURE) IMPLANT
SUT ETHILON 3 0 PS 1 (SUTURE) IMPLANT
SUT MNCRL AB 4-0 PS2 18 (SUTURE) ×2 IMPLANT
SUT VICRYL 3-0 CR8 SH (SUTURE) ×2 IMPLANT
SYR 5ML LUER SLIP (SYRINGE) ×2 IMPLANT
SYR CONTROL 10ML LL (SYRINGE) ×2 IMPLANT
TOWEL GREEN STERILE FF (TOWEL DISPOSABLE) ×2 IMPLANT

## 2018-10-31 NOTE — Anesthesia Preprocedure Evaluation (Addendum)
Anesthesia Evaluation  Patient identified by MRN, date of birth, ID band Patient awake    Reviewed: Allergy & Precautions, NPO status , Patient's Chart, lab work & pertinent test results  History of Anesthesia Complications (+) PONV and history of anesthetic complications  Airway Mallampati: II  TM Distance: <3 FB Neck ROM: Full  Mouth opening: Limited Mouth Opening  Dental  (+) Teeth Intact, Dental Advisory Given,    Pulmonary shortness of breath, asthma , sleep apnea ,    breath sounds clear to auscultation       Cardiovascular hypertension, + dysrhythmias Atrial Fibrillation  Rhythm:Irregular     Neuro/Psych  Headaches, PSYCHIATRIC DISORDERS Anxiety Depression Bipolar Disorder  Neuromuscular disease    GI/Hepatic Neg liver ROS, hiatal hernia, GERD  Medicated and Controlled,  Endo/Other    Renal/GU      Musculoskeletal  (+) Arthritis , Fibromyalgia -  Abdominal   Peds  Hematology   Anesthesia Other Findings   Reproductive/Obstetrics                            Anesthesia Physical  Anesthesia Plan  ASA: II  Anesthesia Plan: General   Post-op Pain Management:    Induction: Intravenous  PONV Risk Score and Plan: 4 or greater and Ondansetron, Dexamethasone, Scopolamine patch - Pre-op and Midazolam  Airway Management Planned: LMA  Additional Equipment: None  Intra-op Plan:   Post-operative Plan: Extubation in OR  Informed Consent: I have reviewed the patients History and Physical, chart, labs and discussed the procedure including the risks, benefits and alternatives for the proposed anesthesia with the patient or authorized representative who has indicated his/her understanding and acceptance.     Dental advisory given  Plan Discussed with: CRNA and Surgeon  Anesthesia Plan Comments: (PAT note written 09/13/2018 by Myra Gianotti, PA-C. )        Anesthesia Quick  Evaluation

## 2018-10-31 NOTE — Transfer of Care (Signed)
Immediate Anesthesia Transfer of Care Note  Patient: Doris Smith  Procedure(s) Performed: INSERTION PORT-A-CATH WITH ULTRASOUND (Left )  Patient Location: PACU  Anesthesia Type:General  Level of Consciousness: awake, alert , oriented and patient cooperative  Airway & Oxygen Therapy: Patient Spontanous Breathing and Patient connected to nasal cannula oxygen  Post-op Assessment: Report given to RN and Post -op Vital signs reviewed and stable  Post vital signs: Reviewed and stable  Last Vitals:  Vitals Value Taken Time  BP    Temp    Pulse 72 10/31/2018 10:37 AM  Resp 15 10/31/2018 10:37 AM  SpO2 100 % 10/31/2018 10:37 AM  Vitals shown include unvalidated device data.  Last Pain:  Vitals:   10/31/18 0826  TempSrc: Oral  PainSc: 8          Complications: No apparent anesthesia complications

## 2018-10-31 NOTE — Anesthesia Postprocedure Evaluation (Signed)
Anesthesia Post Note  Patient: Doris Smith  Procedure(s) Performed: INSERTION PORT-A-CATH WITH ULTRASOUND (Left )     Patient location during evaluation: PACU Anesthesia Type: General Level of consciousness: awake and alert Pain management: pain level controlled Vital Signs Assessment: post-procedure vital signs reviewed and stable Respiratory status: spontaneous breathing, nonlabored ventilation and respiratory function stable Cardiovascular status: blood pressure returned to baseline and stable Postop Assessment: no apparent nausea or vomiting Anesthetic complications: no    Last Vitals:  Vitals:   10/31/18 1145 10/31/18 1203  BP: 126/67 128/72  Pulse: 68 74  Resp: 20 18  Temp:  36.6 C  SpO2: 98% 99%    Last Pain:  Vitals:   10/31/18 1145  TempSrc:   PainSc: 1                  Lynda Rainwater

## 2018-10-31 NOTE — Discharge Instructions (Signed)
° °  NO Tylenol before 2:30pm today!    [facility] - DISCHARGE INSTRUCTIONS TO PATIENT  Activity:  Driving - May drive in 2 or 3 days, if doing well   Lifting - No lifting more than 15 pounds for 5 days, then no limit  Wound Care:   Leave the incision dry for 2 days, then may shower  Diet:  As tolerated  Follow up appointment:  Call Dr. Pollie Friar office [facility]) at [phone] for an appointment in 6 months.       There is no need for a followup in our office for the port, unless there is a question.          We are doing "e" visits post op during this Covid-19 virus epidemic, our office will contact you about this arrangement.  If you have not heard from our office, call our office the day before your scheduled visit to make plans for your visit.  Medications and dosages:  Resume your home medications.  Call Dr. Lucia Gaskins or his office  [phone]) if you have:  Temperature greater than 100.4,  Persistent nausea and vomiting,  Severe uncontrolled pain,  Redness, tenderness, or signs of infection (pain, swelling, redness, odor or green/yellow discharge around the site),  Difficulty breathing, headache or visual disturbances,  Any other questions or concerns you may have after discharge.  In an emergency, call 911 or go to an Emergency Department at a nearby hospital.   Post Anesthesia Home Care Instructions  Activity: Get plenty of rest for the remainder of the day. A responsible adult should stay with you for 24 hours following the procedure.  For the next 24 hours, DO NOT: -Drive a car -Paediatric nurse -Drink alcoholic beverages -Take any medication unless instructed by your physician -Make any legal decisions or sign important papers.  Meals: Start with liquid foods such as gelatin or soup. Progress to regular foods as tolerated. Avoid greasy, spicy, heavy foods. If nausea and/or vomiting occur, drink only clear liquids until the  nausea and/or vomiting subsides. Call your physician if vomiting continues.  Special Instructions/Symptoms: Your throat may feel dry or sore from the anesthesia or the breathing tube placed in your throat during surgery. If this causes discomfort, gargle with warm salt water. The discomfort should disappear within 24 hours.  If you had a scopolamine patch placed behind your ear for the management of post- operative nausea and/or vomiting:  1. The medication in the patch is effective for 72 hours, after which it should be removed.  Wrap patch in a tissue and discard in the trash. Wash hands thoroughly with soap and water. 2. You may remove the patch earlier than 72 hours if you experience unpleasant side effects which may include dry mouth, dizziness or visual disturbances. 3. Avoid touching the patch. Wash your hands with soap and water after contact with the patch.

## 2018-10-31 NOTE — Interval H&P Note (Signed)
History and Physical Interval Note:  10/31/2018 9:12 AM  Doris Smith  has presented today for surgery, with the diagnosis of BREAST CANCER.  The various methods of treatment have been discussed with the patient and family.   Her husband is in the car.  She is depressed about her mastectomy.  [The Covid-19 virus has disrupted normal medical care in [place] and across the nation.  We have sometimes had to alter normal surgical/medical care to limit this epidemic and we have explained these changes to the patient.]  After consideration of risks, benefits and other options for treatment, the patient has consented to  Procedure(s): INSERTION PORT-A-CATH WITH ULTRASOUND (N/A) as a surgical intervention.  The patient's history has been reviewed, patient examined, no change in status, stable for surgery.  I have reviewed the patient's chart and labs.  Questions were answered to the patient's satisfaction.     Shann Medal

## 2018-10-31 NOTE — Op Note (Signed)
10/31/2018  10:30 AM  PATIENT:  Doris Smith, 68 y.o., female MRN: [id number] DOB: 1951/03/16  PREOP DIAGNOSIS:  BREAST CANCER, anticipate chemotherapy  POSTOP DIAGNOSIS:   Right breast cancer, anticipate chemotherapy  PROCEDURE:   Procedure(s):  Left subclavian INSERTION PORT-A-CATH   SURGEON:   Alphonsa Overall, M.D.  ANESTHESIA:   general  Anesthesiologist: Lynda Rainwater, MD CRNA: Suan Halter, CRNA; Wanita Chamberlain, CRNA  General  EBL:  minimal  ml  COUNTS CORRECT:  YES  INDICATIONS FOR PROCEDURE:  Doris Smith is a 68 y.o. (DOB: [birth year]) white female whose primary care physician is Hassell Done, [name], FNP and comes for power port placement for the treatment of right breast cancer.  Dr. Jana Hakim is her treating oncologist.   The indications and risks of the surgery were explained to the patient.  The risks include, but are not limited to, infection, bleeding, pneumothorax, nerve injury, and thrombosis of the vein.  OPERATIVE NOTE:  The patient was taken to OR room #6 at [facility].  Anesthesia was provided by Anesthesiologist: Lynda Rainwater, MD CRNA: Suan Halter, CRNA; Wanita Chamberlain, CRNA.  At the beginning of the operation, the patient was given 2 gm Ancef, had a roll placed under her back, and had the upper chest/neck prepped with Chloroprep and draped.   A time out was held and the surgery checklist reviewed.   The patient was placed in Trendelenburg position.  The left subclavian vein was accessed with a 16 gauge needle and a guide wire threaded through the needle into the vein.  The position of the wire was checked with fluoroscopy.   I then developed a pocket in the upper inner aspect of the left chest for the port reservoir.  I used the [employer] for venous access.  The reservoir was sewn in place with a 3-0 Vicryl suture.  The reservoir had been flushed with dilute (10 units/cc) heparin.   I then passed the silastic tubing  from the reservoir incision to the subclavian stick site and used the 8 French introducer to pass it into the vein.  The tip of the silastic catheter was position at the junction of the SVC and the right atrium under fluoroscopy.  The silastic catheter was then attached to the port with the bayonet device.     The entire port and tubing were checked with fluoroscopy and then the port was flushed with 4 cc of concentrated heparin (100 units/cc).   The wounds were then closed with 3-0 vicryl subcutaneous sutures and the skin closed with a 4-0 Monocryl suture.  The skin was painted with DermaBond.   The patient was transferred to the recovery room in good condition.  The sponge and needle count were correct at the end of the case.  A CXR is ordered for port placement and pending at the time of this note.  Alphonsa Overall, MD, [facility] Pager: [phone] Office phone:  [phone]

## 2018-10-31 NOTE — Anesthesia Procedure Notes (Signed)
Procedure Name: LMA Insertion Date/Time: 10/31/2018 9:47 AM Performed by: Wanita Chamberlain, CRNA Pre-anesthesia Checklist: Patient identified, Emergency Drugs available, Suction available and Patient being monitored Patient Re-evaluated:Patient Re-evaluated prior to induction Oxygen Delivery Method: Circle system utilized Preoxygenation: Pre-oxygenation with 100% oxygen Induction Type: IV induction Ventilation: Mask ventilation without difficulty LMA: LMA inserted LMA Size: 4.0 Number of attempts: 1 Intubation method: gauze b/t central incisors /protect crowns. Placement Confirmation: positive ETCO2,  CO2 detector and breath sounds checked- equal and bilateral Tube secured with: Tape Dental Injury: Teeth and Oropharynx as per pre-operative assessment

## 2018-11-01 ENCOUNTER — Other Ambulatory Visit: Payer: Self-pay | Admitting: Gastroenterology

## 2018-11-01 ENCOUNTER — Encounter (HOSPITAL_BASED_OUTPATIENT_CLINIC_OR_DEPARTMENT_OTHER): Payer: Self-pay | Admitting: Surgery

## 2018-11-04 ENCOUNTER — Other Ambulatory Visit: Payer: Self-pay | Admitting: Oncology

## 2018-11-04 DIAGNOSIS — Z170 Estrogen receptor positive status [ER+]: Secondary | ICD-10-CM

## 2018-11-04 DIAGNOSIS — C50411 Malignant neoplasm of upper-outer quadrant of right female breast: Secondary | ICD-10-CM

## 2018-11-04 NOTE — Progress Notes (Signed)
[place]  Telephone:([phone] [phone]     ID: Doris Smith DOB: 06/28/1951  MR#: [id number]  HYQ#:[id number]  Patient Care Team: Chevis Pretty, FNP as PCP - General (Nurse Practitioner) Alphonsa Overall, MD as Consulting Physician (General Surgery) Shakyia Bosso, Virgie Dad, MD as Consulting Physician (Oncology) Kyung Rudd, MD as Consulting Physician (Radiation Oncology) Elijio Miles, MD as Consulting Physician (Pulmonary Disease) Netta Cedars, MD as Consulting Physician (Orthopedic Surgery) Susa Day, MD as Consulting Physician (Orthopedic Surgery) Janeth Rase, NP as Nurse Practitioner (Adult Health Nurse Practitioner) Mauro Kaufmann, RN as Oncology Nurse Navigator Rockwell Germany, RN as Oncology Nurse Navigator Chauncey Cruel, MD OTHER MD: Chapman Moss, psychiatry   CHIEF COMPLAINT: Estrogen receptor positive breast cancer  CURRENT TREATMENT: Adjuvant chemotherapy   INTERVAL HISTORY: Doris Smith is seen today for follow-up and treatment of her estrogen receptor positive breast cancer.  She is to start adjuvant chemotherapy consisting of cyclophosphamide, methotrexate, fluorouracil (CMF)  repeated every 21 days x 8, starting today.   Since her last visit here, she had a port placed on 10/31/2018.    REVIEW OF SYSTEMS: [name] states that she has been depressed, irritated and extremely fatigued. She notes that her family has not said much to her about her diagnosis, but she believes that may be their way of dealing with it. For exercise, she is taking walks; she believes that she walks about a mile. She notes that she is fairly slow since she has to walk with her cane, but she walks as far as she can.    The patient denies unusual headaches, visual changes, nausea, vomiting, or dizziness. There has been no unusual cough, phlegm production, or pleurisy. This been no change in bowel or bladder habits. The patient denies  unexplained weight loss, bleeding, rash, or fever. A detailed review of systems was otherwise noncontributory.    HISTORY OF CURRENT ILLNESS: "Doris Smith" presented to her PCP, Dr. Hassell Done, with right breast pain, fullness, and nipple inversion since November 2019. She proceeded to undergo bilateral diagnostic mammography with tomography and right breast ultrasonography at [facility] on 07/31/2018 showing: findings compatible with multicentric breast cancer spanning at least the upper inner and upper outer quadrants of the right breast; normal right axilla. Palpable firmness of approximately 1.5 cm was noted in the 9 o'clock position, which was also seen on ultrasound with indisctinct margins measuring 2.7 x 2.3 x 2.2 cm. At 1 o'clock, a mass with poorly defined margins measures 1.9 x 1 x 0.8 cm. Additional poorly defined masses were seen in the 12 o'clock retroareolar region.  Accordingly on 08/02/2018 she proceeded to biopsy of the right breast area in question. The pathology ([id number]) from this procedure showed: invasive mammary carcinoma at 9 o'clock and 1 o'clock; e-cadherin negative, consistent with lobular phenotype; grade 2-3. Prognostic indicators significant for: estrogen receptor, 90% positive and progesterone receptor, 1% positive, both with strong staining intensity. Proliferation marker Ki67 at 1%. HER2 equivocal by immunohistochemistry, 2+ but negative by fluorescent in situ hybridization with a signals ratio 1.04 and number per cell 1.2.   The patient's subsequent history is as detailed below.   PAST MEDICAL HISTORY: Past Medical History:  Diagnosis Date  . A-fib ([place]) 11/2012   PAF  . Anxiety    takes Ativan  . Asthma 04/07/2011   dx  . Bipolar disorder ([place])   . Cancer [facility])    Right breast  . Depression   . Dyspnea    DOE  .  Early cataracts, bilateral   . Fatty liver   . Fibromyalgia   . Fracture, ankle 10/2008   right and left  . GERD (gastroesophageal reflux  disease)   . Irritable bowel syndrome   . Mental disorder    dx bipolar  . PONV (postoperative nausea and vomiting)   . Sleep apnea 04/2011    does not wear CPAP  . Wears glasses   She has chronic sinus headaches, hiatal hernia   PAST SURGICAL HISTORY: Past Surgical History:  Procedure Laterality Date  . ABDOMINAL HYSTERECTOMY    . COLONOSCOPY    . DILATION AND CURETTAGE OF UTERUS  1981   abnormal pap  . KNEE ARTHROSCOPY Left 2007   x 2  . LUMBAR LAMINECTOMY/DECOMPRESSION MICRODISCECTOMY  05/31/2011   Procedure: LUMBAR LAMINECTOMY/DECOMPRESSION MICRODISCECTOMY;  Surgeon: Johnn Hai;  Location: [facility];  Service: Orthopedics;  Laterality: Left;  Decompression Lumbar four to five and  Lumbar five to Sacral one on Left  (X-Ray)  . MASTECTOMY W/ SENTINEL NODE BIOPSY Right 09/16/2018   Procedure: RIGHT MASTECTOMY WITH RIGHT AXILLARY SENTINEL LYMPH NODE BIOPSY;  Surgeon: Alphonsa Overall, MD;  Location: [place];  Service: General;  Laterality: Right;  . PARTIAL HYSTERECTOMY  1982  . PORTACATH PLACEMENT Left 10/31/2018   Procedure: INSERTION PORT-A-CATH WITH ULTRASOUND;  Surgeon: Alphonsa Overall, MD;  Location: [place];  Service: General;  Laterality: Left;  . TONSILLECTOMY     as child  . TOTAL KNEE ARTHROPLASTY Left 12/18/2005    FAMILY HISTORY Family History  Problem Relation Age of Onset  . Anesthesia problems Mother   . Heart disease Mother        CHF, atrial fib  . Heart failure Mother   . Anesthesia problems Sister   . Colon polyps Father   . Heart disease Father        "Fluid around the heart"  . Hypertension Sister   . Breast cancer Maternal Aunt   . Colon cancer Maternal Aunt   . Prostate cancer Neg Hx   . Ovarian cancer Neg Hx    As of February 2019, patient father is alive at 39 years old. Patient mother is alive at 63 years old. She notes a family hx of breast cancer. A maternal aunt was diagnosed with breast cancer, but Doris Smith is unsure if it was in  one or both breasts. She has 3 siblings, 3 sisters and 0 brothers.   GYNECOLOGIC HISTORY:  No LMP recorded. Patient is postmenopausal. Menarche: 68 years old Age at first live birth: 68 years old [place] P 2 LMP about 42 years ago Contraceptive no HRT no  Hysterectomy? partial So? no   SOCIAL HISTORY: Doris Smith worked in [employer] for 15 years. She is on disability because her back, knees, and nerves. Her husband, [name], has been at [employer] 43 years as a Freight forwarder.  Even though their home is in [place] they are actually living in [place] in an apartment while her husband works there.  Son Legrand Como, age 33, works as a Chief Strategy Officer in [place], [place]. Son Shanon Brow, age 61, works as a Building control surveyor in [place], [place]. They have 4 grandchildren, 2 great-grandchildren with 2 more on the way. She attends a [employer].     ADVANCED DIRECTIVES:    HEALTH MAINTENANCE: Social History   Tobacco Use  . Smoking status: Never Smoker  . Smokeless tobacco: Never Used  Substance Use Topics  . Alcohol use: No  . Drug use:  No     Colonoscopy: 2019  PAP: 11/03/2014, normal  Bone density: 10/13/2016, T-score of -2.1   Allergies  Allergen Reactions  . Iohexol     "over 20 years ago" had reaction "hurting in arm and heart"-Done in [place], [place]..physician stopped CT at that time.  . Codeine Other (See Comments)    Makes feel like she is wild   . Prednisone Other (See Comments)    Makes me very irritable  . Sulfonamide Derivatives Other (See Comments)    Upset stomach  . Celecoxib Nausea And Vomiting  . Sulfa Antibiotics Nausea And Vomiting  . Vitamin B12 Rash    Current Outpatient Medications  Medication Sig Dispense Refill  . benzonatate (TESSALON PERLES) 100 MG capsule Take 1 capsule (100 mg total) by mouth 3 (three) times daily as needed for cough. 20 capsule 0  . budesonide-formoterol (SYMBICORT) 160-4.5 MCG/ACT inhaler Inhale 2 puffs into the lungs 2 (two) times daily.    .  clotrimazole-betamethasone (LOTRISONE) cream Apply 1 application topically 2 (two) times daily. (Patient taking differently: Apply 1 application topically 2 (two) times daily as needed (rash). ) 30 g 0  . dexamethasone (DECADRON) 4 MG tablet Take 1 tablet (4 mg total) by mouth daily. Start the day after chemotherapy for 2 days. Take with food. 30 tablet 1  . esomeprazole (NEXIUM) 40 MG capsule TAKE 1 CAPSULE BY MOUTH ONCE DAILY 30 MINUTES BEFORE BREAKFAST 30 capsule 3  . fluticasone (FLONASE) 50 MCG/ACT nasal spray Place 2 sprays into both nostrils daily. 16 g 6  . fluticasone furoate-vilanterol (BREO ELLIPTA) 200-25 MCG/INH AEPB Inhale 1 puff into the lungs daily.    . gabapentin (NEURONTIN) 300 MG capsule Take 1 capsule (300 mg total) by mouth 3 (three) times daily. (Patient taking differently: Take 300 mg by mouth 2 (two) times daily. ) 90 capsule 5  . lamoTRIgine (LAMICTAL) 100 MG tablet Take 100 mg by mouth 2 (two) times daily.     . lidocaine-prilocaine (EMLA) cream Apply to affected area once 30 g 3  . LORazepam (ATIVAN) 0.5 MG tablet Take 1 tablet (0.5 mg total) by mouth 2 (two) times daily as needed. 30 tablet 2  . LORazepam (ATIVAN) 0.5 MG tablet Take 1 tablet (0.5 mg total) by mouth at bedtime as needed (Nausea or vomiting). 30 tablet 0  . montelukast (SINGULAIR) 10 MG tablet Take 10 mg by mouth at bedtime.    . prochlorperazine (COMPAZINE) 10 MG tablet Take 1 tablet (10 mg total) by mouth every 6 (six) hours as needed (Nausea or vomiting). 30 tablet 1  . traZODone (DESYREL) 50 MG tablet Take 50 mg by mouth at bedtime.    . Vilazodone HCl (VIIBRYD) 40 MG TABS Take 40 mg by mouth daily.     No current facility-administered medications for this visit.     OBJECTIVE: Middle-aged white woman who appears stated age Vitals:   11/05/18 0947  BP: (!) 120/44  Pulse: 70  Resp: 17  Temp: 98 F (36.7 C)  SpO2: 100%     Body mass index is 37.08 kg/m.   Wt Readings from Last 3 Encounters:   11/05/18 216 lb (98 kg)  10/31/18 212 lb 4.9 oz (96.3 kg)  10/14/18 215 lb (97.5 kg)      ECOG FS:1 - Symptomatic but completely ambulatory  Sclerae unicteric, pupils round and equal Oropharynx clear and moist No cervical or supraclavicular adenopathy Lungs no rales or rhonchi Heart regular rate and rhythm Abd soft,   nontender, positive bowel sounds MSK no focal spinal tenderness, no upper extremity lymphedema Neuro: nonfocal, well oriented, appropriate affect Breasts: Status post right mastectomy.  Exam was deferred today   LAB RESULTS:  CMP     Component Value Date/Time   NA 140 09/12/2018 0913   NA 142 10/13/2016 1045   K 5.2 (H) 09/12/2018 0913   CL 108 09/12/2018 0913   CO2 24 09/12/2018 0913   GLUCOSE 91 09/12/2018 0913   BUN 6 (L) 09/12/2018 0913   BUN 8 10/13/2016 1045   CREATININE 0.81 09/12/2018 0913   CREATININE 0.83 08/14/2018 0758   CALCIUM 8.6 (L) 09/12/2018 0913   PROT 6.6 09/12/2018 0913   PROT 6.3 10/13/2016 1045   ALBUMIN 3.4 (L) 09/12/2018 0913   ALBUMIN 3.9 10/13/2016 1045   AST 40 09/12/2018 0913   AST 14 (L) 08/14/2018 0758   ALT 6 09/12/2018 0913   ALT 10 08/14/2018 0758   ALKPHOS 93 09/12/2018 0913   BILITOT 1.5 (H) 09/12/2018 0913   BILITOT 0.5 08/14/2018 0758   GFRNONAA >60 09/12/2018 0913   GFRNONAA >60 08/14/2018 0758   GFRAA >60 09/12/2018 0913   GFRAA >60 08/14/2018 0758    No results found for: TOTALPROTELP, ALBUMINELP, A1GS, A2GS, BETS, BETA2SER, GAMS, MSPIKE, SPEI  No results found for: KPAFRELGTCHN, LAMBDASER, KAPLAMBRATIO  Lab Results  Component Value Date   WBC 8.8 11/05/2018   NEUTROABS 6.3 11/05/2018   HGB 12.2 11/05/2018   HCT 37.6 11/05/2018   MCV 90.4 11/05/2018   PLT 232 11/05/2018    @LASTCHEMISTRY@  No results found for: LABCA2  No components found for: LABCAN125  No results for input(s): INR in the last 168 hours.  No results found for: LABCA2  No results found for: CAN199  No results found for:  CAN125  No results found for: CAN153  No results found for: CA2729  No components found for: HGQUANT  No results found for: CEA1 / No results found for: CEA1   No results found for: AFPTUMOR  No results found for: CHROMOGRNA  No results found for: PSA1  Appointment on 11/05/2018  Component Date Value Ref Range Status  . WBC 11/05/2018 8.8  4.0 - 10.5 K/uL Final  . RBC 11/05/2018 4.16  3.87 - 5.11 MIL/uL Final  . Hemoglobin 11/05/2018 12.2  12.0 - 15.0 g/dL Final  . HCT 11/05/2018 37.6  36.0 - 46.0 % Final  . MCV 11/05/2018 90.4  80.0 - 100.0 fL Final  . MCH 11/05/2018 29.3  26.0 - 34.0 pg Final  . MCHC 11/05/2018 32.4  30.0 - 36.0 g/dL Final  . RDW 11/05/2018 13.6  11.5 - 15.5 % Final  . Platelets 11/05/2018 232  150 - 400 K/uL Final  . nRBC 11/05/2018 0.0  0.0 - 0.2 % Final  . Neutrophils Relative % 11/05/2018 71  % Final  . Neutro Abs 11/05/2018 6.3  1.7 - 7.7 K/uL Final  . Lymphocytes Relative 11/05/2018 15  % Final  . Lymphs Abs 11/05/2018 1.3  0.7 - 4.0 K/uL Final  . Monocytes Relative 11/05/2018 10  % Final  . Monocytes Absolute 11/05/2018 0.9  0.1 - 1.0 K/uL Final  . Eosinophils Relative 11/05/2018 3  % Final  . Eosinophils Absolute 11/05/2018 0.2  0.0 - 0.5 K/uL Final  . Basophils Relative 11/05/2018 1  % Final  . Basophils Absolute 11/05/2018 0.1  0.0 - 0.1 K/uL Final  . Immature Granulocytes 11/05/2018 0  % Final  . Abs Immature   Granulocytes 11/05/2018 0.02  0.00 - 0.07 K/uL Final   Performed at [facility], [address] [place], [place] [address]    (this displays the last labs from the last 3 days)  No results found for: TOTALPROTELP, ALBUMINELP, A1GS, A2GS, BETS, BETA2SER, GAMS, MSPIKE, SPEI (this displays SPEP labs)  No results found for: KPAFRELGTCHN, LAMBDASER, KAPLAMBRATIO (kappa/lambda light chains)  No results found for: HGBA, HGBA2QUANT, HGBFQUANT, HGBSQUAN (Hemoglobinopathy evaluation)   No results found for: LDH   No results found for: IRON, TIBC, IRONPCTSAT (Iron and TIBC)  No results found for: FERRITIN  Urinalysis    Component Value Date/Time   COLORURINE YELLOW 05/25/2011 0923   APPEARANCEUR Clear 05/03/2018 1204   LABSPEC 1.011 05/25/2011 0923   PHURINE 7.0 05/25/2011 0923   GLUCOSEU Negative 05/03/2018 [place] 05/25/2011 0923   BILIRUBINUR Negative 05/03/2018 [place] 05/25/2011 0923   PROTEINUR Negative 05/03/2018 [place] 05/25/2011 0923   UROBILINOGEN negative 11/03/2014 1039   UROBILINOGEN 0.2 05/25/2011 0923   NITRITE Negative 05/03/2018 1204   NITRITE NEGATIVE 05/25/2011 0923   LEUKOCYTESUR Negative 05/03/2018 1204     STUDIES: Dg Chest Port 1 View  Result Date: 10/31/2018 CLINICAL DATA:  Port-A-Cath placement.  Right breast carcinoma EXAM: PORTABLE CHEST 1 VIEW COMPARISON:  Chest radiograph February 22, 2018 and chest CT August 23, 2018 FINDINGS: Port-A-Cath tip is in the superior vena cava. No pneumothorax. No edema or consolidation. Heart is upper normal in size with pulmonary vascularity normal. There is a sizable hiatal hernia. There is aortic atherosclerosis. No adenopathy. No appreciable bone lesions. IMPRESSION: Port-A-Cath tip is in the superior vena cava. No pneumothorax. No edema or consolidation. Stable cardiac silhouette. Sizable hiatal hernia present. Aortic Atherosclerosis (ICD10-I70.0). Electronically Signed   By: Lowella Grip III M.D.   On: 10/31/2018 11:30   Dg Fluoro Guide Cv Line-no Report  Result Date: 10/31/2018 Fluoroscopy was utilized by the requesting physician.  No radiographic interpretation.    ELIGIBLE FOR AVAILABLE RESEARCH PROTOCOL: no   ASSESSMENT: 68 y.o. [place], [place] woman status post right breast biopsy 08/02/2018 for a clinical T3 N0, stage IIA invasive lobular carcinoma, grade 2, estrogen receptor strongly positive, progesterone receptor 1% positive, with no HER-2 amplification and an  MIB-1 of 1%.  (a) CT scan of the head and chest, without contrast 08/23/2018 showed nonspecific 0.4 cm left lower lobe pulmonary nodule, no definitive metastatic disease  (b) bone scan 08/23/2018 shows multiple spinal areas of abnormal uptake, but  (c) total spinal MRI 09/03/2018 finds bone scan findings to be secondary to degenerative disease, no evidence of metastatic disease.  (1) status post right mastectomy and sentinel lymph node sampling 09/16/2018 showing a pT3 pN1(mic), stage IIA-IIIA invasive lobular breast cancer, grade 2, with 1 of 5 sampled lymph nodes involved by micrometastatic deposit, with extra nodular extension; ample margins  (2)  MammaPrint "high risk" suggests a 5-year metastasis free survival of 93% with chemotherapy, with an absolute chemotherapy benefit in the greater than 12% range  (3) adjuvant radiation to follow (can be done concurrently with chemotherapy)  (4) anastrozole started neoadjuvantly (on 08/14/2018), discontinued 10/14/2018 in preparation for chemotherapy  (5) Caris requested 10/14/2018  (6) adjuvant chemotherapy consisting of cyclophosphamide, methotrexate, fluorouracil (CMF) to start 11/05/2018, repeated every 21 days x 8   PLAN: Doris Smith did fine with port placement and is ready to start her CMF treatments today.  We again reviewed possible toxicities, side  effects and complications.  Generally this treatment is tolerated quite well, but everyone is different and I have asked her to keep a diary of symptoms for the next week.  She will see me again 11/12/2018 to review those symptoms and make cycle to better for her.  Beginning with cycle 2 we will only see her on treatment day  I have encouraged her to continue to walk her mile a day if she can on good weather days  She knows to call for any other issues that may develop before the next visit.   Zaela Graley, Virgie Dad, MD  11/05/18 10:02 AM Medical Oncology and Hematology [facility]  [address] [place], [place] [address] Tel. [phone]    Fax. [phone]   I, Jacqualyn Posey am acting as a Education administrator for Chauncey Cruel, MD.   I, Lurline Del MD, have reviewed the above documentation for accuracy and completeness, and I agree with the above.

## 2018-11-05 ENCOUNTER — Inpatient Hospital Stay: Payer: BLUE CROSS/BLUE SHIELD

## 2018-11-05 ENCOUNTER — Other Ambulatory Visit: Payer: Self-pay

## 2018-11-05 ENCOUNTER — Inpatient Hospital Stay (HOSPITAL_BASED_OUTPATIENT_CLINIC_OR_DEPARTMENT_OTHER): Payer: BLUE CROSS/BLUE SHIELD | Admitting: Oncology

## 2018-11-05 VITALS — BP 120/44 | HR 70 | Temp 98.0°F | Resp 17 | Ht 64.0 in | Wt 216.0 lb

## 2018-11-05 DIAGNOSIS — C50411 Malignant neoplasm of upper-outer quadrant of right female breast: Secondary | ICD-10-CM

## 2018-11-05 DIAGNOSIS — C50211 Malignant neoplasm of upper-inner quadrant of right female breast: Secondary | ICD-10-CM

## 2018-11-05 DIAGNOSIS — Z79899 Other long term (current) drug therapy: Secondary | ICD-10-CM

## 2018-11-05 DIAGNOSIS — Z9011 Acquired absence of right breast and nipple: Secondary | ICD-10-CM | POA: Diagnosis not present

## 2018-11-05 DIAGNOSIS — Z170 Estrogen receptor positive status [ER+]: Secondary | ICD-10-CM | POA: Diagnosis not present

## 2018-11-05 DIAGNOSIS — R911 Solitary pulmonary nodule: Secondary | ICD-10-CM | POA: Diagnosis not present

## 2018-11-05 DIAGNOSIS — Z95828 Presence of other vascular implants and grafts: Secondary | ICD-10-CM

## 2018-11-05 DIAGNOSIS — Z5111 Encounter for antineoplastic chemotherapy: Secondary | ICD-10-CM | POA: Diagnosis not present

## 2018-11-05 LAB — COMPREHENSIVE METABOLIC PANEL
ALT: 11 U/L (ref 0–44)
AST: 15 U/L (ref 15–41)
Albumin: 3.5 g/dL (ref 3.5–5.0)
Alkaline Phosphatase: 95 U/L (ref 38–126)
Anion gap: 9 (ref 5–15)
BUN: 13 mg/dL (ref 8–23)
CO2: 27 mmol/L (ref 22–32)
Calcium: 8.6 mg/dL — ABNORMAL LOW (ref 8.9–10.3)
Chloride: 107 mmol/L (ref 98–111)
Creatinine, Ser: 0.86 mg/dL (ref 0.44–1.00)
GFR calc Af Amer: >60 mL/min (ref >60)
GFR calc non Af Amer: >60 mL/min (ref >60)
Glucose, Bld: 93 mg/dL (ref 70–99)
Potassium: 4.2 mmol/L (ref 3.5–5.1)
Sodium: 143 mmol/L (ref 135–145)
Total Bilirubin: 0.4 mg/dL (ref 0.3–1.2)
Total Protein: 6.6 g/dL (ref 6.5–8.1)

## 2018-11-05 LAB — CBC WITH DIFFERENTIAL/PLATELET
Abs Immature Granulocytes: 0.02 10*3/uL (ref 0.00–0.07)
Basophils Absolute: 0.1 10*3/uL (ref 0.0–0.1)
Basophils Relative: 1 %
Eosinophils Absolute: 0.2 10*3/uL (ref 0.0–0.5)
Eosinophils Relative: 3 %
HCT: 37.6 % (ref 36.0–46.0)
Hemoglobin: 12.2 g/dL (ref 12.0–15.0)
Immature Granulocytes: 0 %
Lymphocytes Relative: 15 %
Lymphs Abs: 1.3 10*3/uL (ref 0.7–4.0)
MCH: 29.3 pg (ref 26.0–34.0)
MCHC: 32.4 g/dL (ref 30.0–36.0)
MCV: 90.4 fL (ref 80.0–100.0)
Monocytes Absolute: 0.9 10*3/uL (ref 0.1–1.0)
Monocytes Relative: 10 %
Neutro Abs: 6.3 10*3/uL (ref 1.7–7.7)
Neutrophils Relative %: 71 %
Platelets: 232 10*3/uL (ref 150–400)
RBC: 4.16 MIL/uL (ref 3.87–5.11)
RDW: 13.6 % (ref 11.5–15.5)
WBC: 8.8 10*3/uL (ref 4.0–10.5)
nRBC: 0 % (ref 0.0–0.2)

## 2018-11-05 MED ORDER — SODIUM CHLORIDE 0.9 % IV SOLN
Freq: Once | INTRAVENOUS | Status: AC
  Administered 2018-11-05: 11:00:00 via INTRAVENOUS
  Filled 2018-11-05: qty 250

## 2018-11-05 MED ORDER — PALONOSETRON HCL INJECTION 0.25 MG/5ML
0.2500 mg | Freq: Once | INTRAVENOUS | Status: AC
  Administered 2018-11-05: 0.25 mg via INTRAVENOUS

## 2018-11-05 MED ORDER — SODIUM CHLORIDE 0.9% FLUSH
10.0000 mL | INTRAVENOUS | Status: DC | PRN
  Administered 2018-11-05: 10 mL
  Filled 2018-11-05: qty 10

## 2018-11-05 MED ORDER — SODIUM CHLORIDE 0.9 % IV SOLN
600.0000 mg/m2 | Freq: Once | INTRAVENOUS | Status: AC
  Administered 2018-11-05: 1260 mg via INTRAVENOUS
  Filled 2018-11-05: qty 63

## 2018-11-05 MED ORDER — PALONOSETRON HCL INJECTION 0.25 MG/5ML
INTRAVENOUS | Status: AC
  Filled 2018-11-05: qty 5

## 2018-11-05 MED ORDER — DEXAMETHASONE SODIUM PHOSPHATE 10 MG/ML IJ SOLN
INTRAMUSCULAR | Status: AC
  Filled 2018-11-05: qty 1

## 2018-11-05 MED ORDER — HEPARIN SOD (PORK) LOCK FLUSH 100 UNIT/ML IV SOLN
500.0000 [IU] | Freq: Once | INTRAVENOUS | Status: AC | PRN
  Administered 2018-11-05: 500 [IU]
  Filled 2018-11-05: qty 5

## 2018-11-05 MED ORDER — METHOTREXATE SODIUM (PF) CHEMO INJECTION 250 MG/10ML
39.8000 mg/m2 | Freq: Once | INTRAMUSCULAR | Status: AC
  Administered 2018-11-05: 84 mg via INTRAVENOUS
  Filled 2018-11-05: qty 3.36

## 2018-11-05 MED ORDER — FLUOROURACIL CHEMO INJECTION 2.5 GM/50ML
600.0000 mg/m2 | Freq: Once | INTRAVENOUS | Status: AC
  Administered 2018-11-05: 1250 mg via INTRAVENOUS
  Filled 2018-11-05: qty 25

## 2018-11-05 MED ORDER — DEXAMETHASONE SODIUM PHOSPHATE 10 MG/ML IJ SOLN
10.0000 mg | Freq: Once | INTRAMUSCULAR | Status: AC
  Administered 2018-11-05: 10 mg via INTRAVENOUS

## 2018-11-05 MED ORDER — SODIUM CHLORIDE 0.9% FLUSH
10.0000 mL | INTRAVENOUS | Status: DC | PRN
  Administered 2018-11-05: 09:00:00 10 mL via INTRAVENOUS
  Filled 2018-11-05: qty 10

## 2018-11-05 NOTE — Patient Instructions (Signed)
[facility] Discharge Instructions for Patients Receiving Chemotherapy  Today you received the following chemotherapy agents Cytoxan, Methotrexate, 5 FU  To help prevent nausea and vomiting after your treatment, we encourage you to take your nausea medication as directed  If you develop nausea and vomiting that is not controlled by your nausea medication, call the clinic.   BELOW ARE SYMPTOMS THAT SHOULD BE REPORTED IMMEDIATELY:  *FEVER GREATER THAN 100.5 F  *CHILLS WITH OR WITHOUT FEVER  NAUSEA AND VOMITING THAT IS NOT CONTROLLED WITH YOUR NAUSEA MEDICATION  *UNUSUAL SHORTNESS OF BREATH  *UNUSUAL BRUISING OR BLEEDING  TENDERNESS IN MOUTH AND THROAT WITH OR WITHOUT PRESENCE OF ULCERS  *URINARY PROBLEMS  *BOWEL PROBLEMS  UNUSUAL RASH Items with * indicate a potential emergency and should be followed up as soon as possible.  Feel free to call the clinic should you have any questions or concerns. The clinic phone number is [phone].  Please show the [facility] at check-in to the Emergency Department and triage nurse.  Cyclophosphamide injection(cytoxan) What is this medicine? CYCLOPHOSPHAMIDE (sye kloe FOSS fa mide) is a chemotherapy drug. It slows the growth of cancer cells. This medicine is used to treat many types of cancer like lymphoma, myeloma, leukemia, breast cancer, and ovarian cancer, to name a few. This medicine may be used for other purposes; ask your health care provider or pharmacist if you have questions. COMMON BRAND NAME(S): Cytoxan, Neosar What should I tell my health care provider before I take this medicine? They need to know if you have any of these conditions: -blood disorders -history of other chemotherapy -infection -kidney disease -liver disease -recent or ongoing radiation therapy -tumors in the bone marrow -an unusual or allergic reaction to cyclophosphamide, other chemotherapy, other medicines, foods, dyes, or  preservatives -pregnant or trying to get pregnant -breast-feeding How should I use this medicine? This drug is usually given as an injection into a vein or muscle or by infusion into a vein. It is administered in a hospital or clinic by a specially trained health care professional. Talk to your pediatrician regarding the use of this medicine in children. Special care may be needed. Overdosage: If you think you have taken too much of this medicine contact a poison control center or emergency room at once. NOTE: This medicine is only for you. Do not share this medicine with others. What if I miss a dose? It is important not to miss your dose. Call your doctor or health care professional if you are unable to keep an appointment. What may interact with this medicine? This medicine may interact with the following medications: -amiodarone -amphotericin B -azathioprine -certain antiviral medicines for HIV or AIDS such as protease inhibitors (e.g., indinavir, ritonavir) and zidovudine -certain blood pressure medications such as benazepril, captopril, enalapril, fosinopril, lisinopril, moexipril, monopril, perindopril, quinapril, ramipril, trandolapril -certain cancer medications such as anthracyclines (e.g., daunorubicin, doxorubicin), busulfan, cytarabine, paclitaxel, pentostatin, tamoxifen, trastuzumab -certain diuretics such as chlorothiazide, chlorthalidone, hydrochlorothiazide, indapamide, metolazone -certain medicines that treat or prevent blood clots like warfarin -certain muscle relaxants such as succinylcholine -cyclosporine -etanercept -indomethacin -medicines to increase blood counts like filgrastim, pegfilgrastim, sargramostim -medicines used as general anesthesia -metronidazole -natalizumab This list may not describe all possible interactions. Give your health care provider a list of all the medicines, herbs, non-prescription drugs, or dietary supplements you use. Also tell them if  you smoke, drink alcohol, or use illegal drugs. Some items may interact with your medicine. What should I watch  for while using this medicine? Visit your doctor for checks on your progress. This drug may make you feel generally unwell. This is not uncommon, as chemotherapy can affect healthy cells as well as cancer cells. Report any side effects. Continue your course of treatment even though you feel ill unless your doctor tells you to stop. Drink water or other fluids as directed. Urinate often, even at night. In some cases, you may be given additional medicines to help with side effects. Follow all directions for their use. Call your doctor or health care professional for advice if you get a fever, chills or sore throat, or other symptoms of a cold or flu. Do not treat yourself. This drug decreases your body's ability to fight infections. Try to avoid being around people who are sick. This medicine may increase your risk to bruise or bleed. Call your doctor or health care professional if you notice any unusual bleeding. Be careful brushing and flossing your teeth or using a toothpick because you may get an infection or bleed more easily. If you have any dental work done, tell your dentist you are receiving this medicine. You may get drowsy or dizzy. Do not drive, use machinery, or do anything that needs mental alertness until you know how this medicine affects you. Do not become pregnant while taking this medicine or for 1 year after stopping it. Women should inform their doctor if they wish to become pregnant or think they might be pregnant. Men should not father a child while taking this medicine and for 4 months after stopping it. There is a potential for serious side effects to an unborn child. Talk to your health care professional or pharmacist for more information. Do not breast-feed an infant while taking this medicine. This medicine may interfere with the ability to have a child. This medicine  has caused ovarian failure in some women. This medicine has caused reduced sperm counts in some men. You should talk with your doctor or health care professional if you are concerned about your fertility. If you are going to have surgery, tell your doctor or health care professional that you have taken this medicine. What side effects may I notice from receiving this medicine? Side effects that you should report to your doctor or health care professional as soon as possible: -allergic reactions like skin rash, itching or hives, swelling of the face, lips, or tongue -low blood counts - this medicine may decrease the number of white blood cells, red blood cells and platelets. You may be at increased risk for infections and bleeding. -signs of infection - fever or chills, cough, sore throat, pain or difficulty passing urine -signs of decreased platelets or bleeding - bruising, pinpoint red spots on the skin, black, tarry stools, blood in the urine -signs of decreased red blood cells - unusually weak or tired, fainting spells, lightheadedness -breathing problems -dark urine -dizziness -palpitations -swelling of the ankles, feet, hands -trouble passing urine or change in the amount of urine -weight gain -yellowing of the eyes or skin Side effects that usually do not require medical attention (report to your doctor or health care professional if they continue or are bothersome): -changes in nail or skin color -hair loss -missed menstrual periods -mouth sores -nausea, vomiting This list may not describe all possible side effects. Call your doctor for medical advice about side effects. You may report side effects to FDA at 1-800-FDA-1088. Where should I keep my medicine? This drug is given in a hospital  or clinic and will not be stored at home. NOTE: This sheet is a summary. It may not cover all possible information. If you have questions about this medicine, talk to your doctor, pharmacist, or  health care provider.  2019 Elsevier/Gold Standard (2012-05-10 16:22:58)  Methotrexate injection What is this medicine? METHOTREXATE (METH oh TREX ate) is a chemotherapy drug used to treat cancer including breast cancer, leukemia, and lymphoma. This medicine can also be used to treat psoriasis and certain kinds of arthritis. This medicine may be used for other purposes; ask your health care provider or pharmacist if you have questions. What should I tell my health care provider before I take this medicine? They need to know if you have any of these conditions: -fluid in the stomach area or lungs -if you often drink alcohol -infection or immune system problems -kidney disease -liver disease -low blood counts, like low white cell, platelet, or red cell counts -lung disease -radiation therapy -stomach ulcers -ulcerative colitis -an unusual or allergic reaction to methotrexate, other medicines, foods, dyes, or preservatives -pregnant or trying to get pregnant -breast-feeding How should I use this medicine? This medicine is for infusion into a vein or for injection into muscle or into the spinal fluid (whichever applies). It is usually given by a health care professional in a hospital or clinic setting. In rare cases, you might get this medicine at home. You will be taught how to give this medicine. Use exactly as directed. Take your medicine at regular intervals. Do not take your medicine more often than directed. If this medicine is used for arthritis or psoriasis, it should be taken weekly, NOT daily. It is important that you put your used needles and syringes in a special sharps container. Do not put them in a trash can. If you do not have a sharps container, call your pharmacist or healthcare provider to get one. Talk to your pediatrician regarding the use of this medicine in children. While this drug may be prescribed for children as young as 2 years for selected conditions, precautions  do apply. Overdosage: If you think you have taken too much of this medicine contact a poison control center or emergency room at once. NOTE: This medicine is only for you. Do not share this medicine with others. What if I miss a dose? It is important not to miss your dose. Call your doctor or health care professional if you are unable to keep an appointment. If you give yourself the medicine and you miss a dose, talk with your doctor or health care professional. Do not take double or extra doses. What may interact with this medicine? This medicine may interact with the following medications: -acitretin -aspirin or aspirin-like medicines including salicylates -azathioprine -certain antibiotics like chloramphenicol, penicillin, tetracycline -certain medicines for stomach problems like esomeprazole, omeprazole, pantoprazole -cyclosporine -gold -hydroxychloroquine -live virus vaccines -mercaptopurine -NSAIDs, medicines for pain and inflammation, like ibuprofen or naproxen -other cytotoxic agents -penicillamine -phenylbutazone -phenytoin -probenacid -retinoids such as isotretinoin and tretinoin -steroid medicines like prednisone or cortisone -sulfonamides like sulfasalazine and trimethoprim/sulfamethoxazole -theophylline This list may not describe all possible interactions. Give your health care provider a list of all the medicines, herbs, non-prescription drugs, or dietary supplements you use. Also tell them if you smoke, drink alcohol, or use illegal drugs. Some items may interact with your medicine. What should I watch for while using this medicine? Avoid alcoholic drinks. In some cases, you may be given additional medicines to help with side effects. Follow  all directions for their use. This medicine can make you more sensitive to the sun. Keep out of the sun. If you cannot avoid being in the sun, wear protective clothing and use sunscreen. Do not use sun lamps or tanning  beds/booths. You may get drowsy or dizzy. Do not drive, use machinery, or do anything that needs mental alertness until you know how this medicine affects you. Do not stand or sit up quickly, especially if you are an older patient. This reduces the risk of dizzy or fainting spells. You may need blood work done while you are taking this medicine. Call your doctor or health care professional for advice if you get a fever, chills or sore throat, or other symptoms of a cold or flu. Do not treat yourself. This drug decreases your body's ability to fight infections. Try to avoid being around people who are sick. This medicine may increase your risk to bruise or bleed. Call your doctor or health care professional if you notice any unusual bleeding. Check with your doctor or health care professional if you get an attack of severe diarrhea, nausea and vomiting, or if you sweat a lot. The loss of too much body fluid can make it dangerous for you to take this medicine. Talk to your doctor about your risk of cancer. You may be more at risk for certain types of cancers if you take this medicine. Both men and women must use effective birth control with this medicine. Do not become pregnant while taking this medicine or until at least 1 normal menstrual cycle has occurred after stopping it. Women should inform their doctor if they wish to become pregnant or think they might be pregnant. Men should not father a child while taking this medicine and for 3 months after stopping it. There is a potential for serious side effects to an unborn child. Talk to your health care professional or pharmacist for more information. Do not breast-feed an infant while taking this medicine. What side effects may I notice from receiving this medicine? Side effects that you should report to your doctor or health care professional as soon as possible: -allergic reactions like skin rash, itching or hives, swelling of the face, lips, or  tongue -back pain -breathing problems or shortness of breath -confusion -diarrhea -dry, nonproductive cough -low blood counts - this medicine may decrease the number of white blood cells, red blood cells and platelets. You may be at increased risk of infections and bleeding -mouth sores -redness, blistering, peeling or loosening of the skin, including inside the mouth -seizures -severe headaches -signs of infection - fever or chills, cough, sore throat, pain or difficulty passing urine -signs and symptoms of bleeding such as bloody or black, tarry stools; red or dark-brown urine; spitting up blood or brown material that looks like coffee grounds; red spots on the skin; unusual bruising or bleeding from the eye, gums, or nose -signs and symptoms of kidney injury like trouble passing urine or change in the amount of urine -signs and symptoms of liver injury like dark yellow or brown urine; general ill feeling or flu-like symptoms; light-colored stools; loss of appetite; nausea; right upper belly pain; unusually weak or tired; yellowing of the eyes or skin -stiff neck -vomiting Side effects that usually do not require medical attention (report to your doctor or health care professional if they continue or are bothersome): -dizziness -hair loss -headache -stomach pain -upset stomach This list may not describe all possible side effects. Call your  doctor for medical advice about side effects. You may report side effects to FDA at 1-800-FDA-1088. Where should I keep my medicine? If you are using this medicine at home, you will be instructed on how to store this medicine. Throw away any unused medicine after the expiration date on the label. NOTE: This sheet is a summary. It may not cover all possible information. If you have questions about this medicine, talk to your doctor, pharmacist, or health care provider.  2019 Elsevier/Gold Standard (2017-02-15 13:31:42)  Fluorouracil, 5-FU  injection What is this medicine? FLUOROURACIL, 5-FU (flure oh YOOR a sil) is a chemotherapy drug. It slows the growth of cancer cells. This medicine is used to treat many types of cancer like breast cancer, colon or rectal cancer, pancreatic cancer, and stomach cancer. This medicine may be used for other purposes; ask your health care provider or pharmacist if you have questions. COMMON BRAND NAME(S): Adrucil What should I tell my health care provider before I take this medicine? They need to know if you have any of these conditions: -blood disorders -dihydropyrimidine dehydrogenase (DPD) deficiency -infection (especially a virus infection such as chickenpox, cold sores, or herpes) -kidney disease -liver disease -malnourished, poor nutrition -recent or ongoing radiation therapy -an unusual or allergic reaction to fluorouracil, other chemotherapy, other medicines, foods, dyes, or preservatives -pregnant or trying to get pregnant -breast-feeding How should I use this medicine? This drug is given as an infusion or injection into a vein. It is administered in a hospital or clinic by a specially trained health care professional. Talk to your pediatrician regarding the use of this medicine in children. Special care may be needed. Overdosage: If you think you have taken too much of this medicine contact a poison control center or emergency room at once. NOTE: This medicine is only for you. Do not share this medicine with others. What if I miss a dose? It is important not to miss your dose. Call your doctor or health care professional if you are unable to keep an appointment. What may interact with this medicine? -allopurinol -cimetidine -dapsone -digoxin -hydroxyurea -leucovorin -levamisole -medicines for seizures like ethotoin, fosphenytoin, phenytoin -medicines to increase blood counts like filgrastim, pegfilgrastim, sargramostim -medicines that treat or prevent blood clots like  warfarin, enoxaparin, and dalteparin -methotrexate -metronidazole -pyrimethamine -some other chemotherapy drugs like busulfan, cisplatin, estramustine, vinblastine -trimethoprim -trimetrexate -vaccines Talk to your doctor or health care professional before taking any of these medicines: -acetaminophen -aspirin -ibuprofen -ketoprofen -naproxen This list may not describe all possible interactions. Give your health care provider a list of all the medicines, herbs, non-prescription drugs, or dietary supplements you use. Also tell them if you smoke, drink alcohol, or use illegal drugs. Some items may interact with your medicine. What should I watch for while using this medicine? Visit your doctor for checks on your progress. This drug may make you feel generally unwell. This is not uncommon, as chemotherapy can affect healthy cells as well as cancer cells. Report any side effects. Continue your course of treatment even though you feel ill unless your doctor tells you to stop. In some cases, you may be given additional medicines to help with side effects. Follow all directions for their use. Call your doctor or health care professional for advice if you get a fever, chills or sore throat, or other symptoms of a cold or flu. Do not treat yourself. This drug decreases your body's ability to fight infections. Try to avoid being around people who are  sick. This medicine may increase your risk to bruise or bleed. Call your doctor or health care professional if you notice any unusual bleeding. Be careful brushing and flossing your teeth or using a toothpick because you may get an infection or bleed more easily. If you have any dental work done, tell your dentist you are receiving this medicine. Avoid taking products that contain aspirin, acetaminophen, ibuprofen, naproxen, or ketoprofen unless instructed by your doctor. These medicines may hide a fever. Do not become pregnant while taking this medicine.  Women should inform their doctor if they wish to become pregnant or think they might be pregnant. There is a potential for serious side effects to an unborn child. Talk to your health care professional or pharmacist for more information. Do not breast-feed an infant while taking this medicine. Men should inform their doctor if they wish to father a child. This medicine may lower sperm counts. Do not treat diarrhea with over the counter products. Contact your doctor if you have diarrhea that lasts more than 2 days or if it is severe and watery. This medicine can make you more sensitive to the sun. Keep out of the sun. If you cannot avoid being in the sun, wear protective clothing and use sunscreen. Do not use sun lamps or tanning beds/booths. What side effects may I notice from receiving this medicine? Side effects that you should report to your doctor or health care professional as soon as possible: -allergic reactions like skin rash, itching or hives, swelling of the face, lips, or tongue -low blood counts - this medicine may decrease the number of white blood cells, red blood cells and platelets. You may be at increased risk for infections and bleeding. -signs of infection - fever or chills, cough, sore throat, pain or difficulty passing urine -signs of decreased platelets or bleeding - bruising, pinpoint red spots on the skin, black, tarry stools, blood in the urine -signs of decreased red blood cells - unusually weak or tired, fainting spells, lightheadedness -breathing problems -changes in vision -chest pain -mouth sores -nausea and vomiting -pain, swelling, redness at site where injected -pain, tingling, numbness in the hands or feet -redness, swelling, or sores on hands or feet -stomach pain -unusual bleeding Side effects that usually do not require medical attention (report to your doctor or health care professional if they continue or are bothersome): -changes in finger or toe  nails -diarrhea -dry or itchy skin -hair loss -headache -loss of appetite -sensitivity of eyes to the light -stomach upset -unusually teary eyes This list may not describe all possible side effects. Call your doctor for medical advice about side effects. You may report side effects to FDA at 1-800-FDA-1088. Where should I keep my medicine? This drug is given in a hospital or clinic and will not be stored at home. NOTE: This sheet is a summary. It may not cover all possible information. If you have questions about this medicine, talk to your doctor, pharmacist, or health care provider.  2019 Elsevier/Gold Standard (2007-10-30 13:53:16)

## 2018-11-06 ENCOUNTER — Telehealth: Payer: Self-pay | Admitting: *Deleted

## 2018-11-06 NOTE — Telephone Encounter (Signed)
Called [facility] for chemotherapy F/U.   "I've been throwing up before I started chemotherapy and I'm tired.  Take nausea medications he knows so I don't consider these side effects.  Otherwise I am well, no new side effects or symptoms yet.  Drinking lots of water and eating.  No bowel or bladder problems." "Appointments are fine now but after May 19th, every appointment is to be scheduled early in the morning.  I will not have someone come in and have to wait on me.  My husband has to work.  He can pick me up between 1:00 pm and 1:30 pm at the latest.  I'll give up to the cancer and not come in if not."    Appointment request care coordination note entered. Asked for notification of transportation needs for [facility] to assist.   Treatment F/U instructions to drink 64 oz minimum daily or at least the day before, of and after treatment.   Denies further questions or needs at this time.  Encouraged to call [phone] Mon -Fri 8:00 am - 4:30 pm or anytime as needed for symptoms, changes or event outside office hours.

## 2018-11-06 NOTE — Telephone Encounter (Signed)
-----   Message from Cherylynn Ridges, RN sent at 11/06/2018  8:40 AM EDT ----- Regarding: Dr. Jana Hakim, 1st Chemo F/U, [facility]

## 2018-11-07 ENCOUNTER — Telehealth: Payer: Self-pay | Admitting: *Deleted

## 2018-11-07 ENCOUNTER — Other Ambulatory Visit: Payer: Self-pay | Admitting: Gastroenterology

## 2018-11-07 ENCOUNTER — Encounter: Payer: Self-pay | Admitting: *Deleted

## 2018-11-07 NOTE — Telephone Encounter (Signed)
Called pt to assess needs after 1st CMF. Relate doing well, a little "queasy". Pt relate she is taking anti-nausea medications a prescribed. Informed she has nausea and vomiting with eating food, but this is a pre-existing condition. Encourage pt to continue taking anti-nausea medications and to call with needs. Received verbal understanding.

## 2018-11-07 NOTE — Telephone Encounter (Signed)
Patient called said that the pharmacy has not received the med Esomeprazole if we can resent the med to the pharmacy.  Patient also said that she was diagnose with Breast Cancer stage 2 and is going through chemo/radiation she will follow back up with [place] after she is done for her hiatal hernia.

## 2018-11-07 NOTE — Telephone Encounter (Signed)
rx sent to pharmacy

## 2018-11-12 ENCOUNTER — Other Ambulatory Visit: Payer: Self-pay

## 2018-11-12 ENCOUNTER — Inpatient Hospital Stay (HOSPITAL_BASED_OUTPATIENT_CLINIC_OR_DEPARTMENT_OTHER): Payer: BLUE CROSS/BLUE SHIELD | Admitting: Oncology

## 2018-11-12 ENCOUNTER — Inpatient Hospital Stay: Payer: BLUE CROSS/BLUE SHIELD

## 2018-11-12 ENCOUNTER — Inpatient Hospital Stay: Payer: BLUE CROSS/BLUE SHIELD | Attending: Oncology

## 2018-11-12 VITALS — BP 124/66 | HR 74 | Temp 97.0°F | Resp 18 | Ht 64.0 in | Wt 213.0 lb

## 2018-11-12 DIAGNOSIS — Z9011 Acquired absence of right breast and nipple: Secondary | ICD-10-CM | POA: Insufficient documentation

## 2018-11-12 DIAGNOSIS — Z5111 Encounter for antineoplastic chemotherapy: Secondary | ICD-10-CM | POA: Diagnosis not present

## 2018-11-12 DIAGNOSIS — Z95828 Presence of other vascular implants and grafts: Secondary | ICD-10-CM

## 2018-11-12 DIAGNOSIS — C50411 Malignant neoplasm of upper-outer quadrant of right female breast: Secondary | ICD-10-CM | POA: Insufficient documentation

## 2018-11-12 DIAGNOSIS — R911 Solitary pulmonary nodule: Secondary | ICD-10-CM

## 2018-11-12 DIAGNOSIS — R42 Dizziness and giddiness: Secondary | ICD-10-CM | POA: Diagnosis not present

## 2018-11-12 DIAGNOSIS — F312 Bipolar disorder, current episode manic severe with psychotic features: Secondary | ICD-10-CM | POA: Diagnosis not present

## 2018-11-12 DIAGNOSIS — Z170 Estrogen receptor positive status [ER+]: Secondary | ICD-10-CM | POA: Diagnosis not present

## 2018-11-12 DIAGNOSIS — Z79899 Other long term (current) drug therapy: Secondary | ICD-10-CM | POA: Insufficient documentation

## 2018-11-12 DIAGNOSIS — F4310 Post-traumatic stress disorder, unspecified: Secondary | ICD-10-CM | POA: Diagnosis not present

## 2018-11-12 DIAGNOSIS — F410 Panic disorder [episodic paroxysmal anxiety] without agoraphobia: Secondary | ICD-10-CM | POA: Diagnosis not present

## 2018-11-12 DIAGNOSIS — C50211 Malignant neoplasm of upper-inner quadrant of right female breast: Secondary | ICD-10-CM

## 2018-11-12 LAB — COMPREHENSIVE METABOLIC PANEL
ALT: 16 U/L (ref 0–44)
AST: 17 U/L (ref 15–41)
Albumin: 3.3 g/dL — ABNORMAL LOW (ref 3.5–5.0)
Alkaline Phosphatase: 98 U/L (ref 38–126)
Anion gap: 7 (ref 5–15)
BUN: 14 mg/dL (ref 8–23)
CO2: 28 mmol/L (ref 22–32)
Calcium: 8.1 mg/dL — ABNORMAL LOW (ref 8.9–10.3)
Chloride: 105 mmol/L (ref 98–111)
Creatinine, Ser: 0.83 mg/dL (ref 0.44–1.00)
GFR calc Af Amer: >60 mL/min (ref >60)
GFR calc non Af Amer: >60 mL/min (ref >60)
Glucose, Bld: 100 mg/dL — ABNORMAL HIGH (ref 70–99)
Potassium: 3.6 mmol/L (ref 3.5–5.1)
Sodium: 140 mmol/L (ref 135–145)
Total Bilirubin: 0.3 mg/dL (ref 0.3–1.2)
Total Protein: 6.3 g/dL — ABNORMAL LOW (ref 6.5–8.1)

## 2018-11-12 LAB — CBC WITH DIFFERENTIAL/PLATELET
Abs Immature Granulocytes: 0.05 10*3/uL (ref 0.00–0.07)
Basophils Absolute: 0.1 10*3/uL (ref 0.0–0.1)
Basophils Relative: 1 %
Eosinophils Absolute: 0.2 10*3/uL (ref 0.0–0.5)
Eosinophils Relative: 3 %
HCT: 34.3 % — ABNORMAL LOW (ref 36.0–46.0)
Hemoglobin: 11 g/dL — ABNORMAL LOW (ref 12.0–15.0)
Immature Granulocytes: 1 %
Lymphocytes Relative: 17 %
Lymphs Abs: 1 10*3/uL (ref 0.7–4.0)
MCH: 28.4 pg (ref 26.0–34.0)
MCHC: 32.1 g/dL (ref 30.0–36.0)
MCV: 88.4 fL (ref 80.0–100.0)
Monocytes Absolute: 0.1 10*3/uL (ref 0.1–1.0)
Monocytes Relative: 2 %
Neutro Abs: 4.2 10*3/uL (ref 1.7–7.7)
Neutrophils Relative %: 76 %
Platelets: 172 10*3/uL (ref 150–400)
RBC: 3.88 MIL/uL (ref 3.87–5.11)
RDW: 12.9 % (ref 11.5–15.5)
WBC: 5.5 10*3/uL (ref 4.0–10.5)
nRBC: 0 % (ref 0.0–0.2)

## 2018-11-12 MED ORDER — HEPARIN SOD (PORK) LOCK FLUSH 100 UNIT/ML IV SOLN
500.0000 [IU] | Freq: Once | INTRAVENOUS | Status: AC | PRN
  Administered 2018-11-12: 500 [IU]
  Filled 2018-11-12: qty 5

## 2018-11-12 MED ORDER — SODIUM CHLORIDE 0.9% FLUSH
10.0000 mL | INTRAVENOUS | Status: DC | PRN
  Administered 2018-11-12: 10 mL
  Filled 2018-11-12: qty 10

## 2018-11-12 NOTE — Progress Notes (Signed)
[place]  [phone] [phone]     ID: Doris Smith DOB: 1951/01/18  MR#: [id number]  LPF#:[id number]  Patient Care Team: Chevis Pretty, FNP as PCP - General (Nurse Practitioner) Alphonsa Overall, MD as Consulting Physician (General Surgery) Britton Perkinson, Virgie Dad, MD as Consulting Physician (Oncology) Kyung Rudd, MD as Consulting Physician (Radiation Oncology) Elijio Miles, MD as Consulting Physician (Pulmonary Disease) Netta Cedars, MD as Consulting Physician (Orthopedic Surgery) Susa Day, MD as Consulting Physician (Orthopedic Surgery) Janeth Rase, NP as Nurse Practitioner (Adult Health Nurse Practitioner) Mauro Kaufmann, RN as Oncology Nurse Navigator Rockwell Germany, RN as Oncology Nurse Navigator Chauncey Cruel, MD OTHER MD: Chapman Moss, psychiatry   CHIEF COMPLAINT: Estrogen receptor positive breast cancer  CURRENT TREATMENT: Adjuvant chemotherapy   INTERVAL HISTORY: [name] is seen today for follow-up and treatment of her estrogen receptor positive breast cancer.  She received her first cycle of cyclophosphamide, methotrexate, fluorouracil (CMF), to be repeated every 21 days x 8, on 11/05/2018. She reports increased fatigue, dizziness, nausea. She notes she forgot to take her nausea medicine; "I just didn't think about it." She reports vomiting but notes this is a chronic issue. She also reports black, hard stool. She notes today is the best day she's had.  Since her last visit here, she has not undergone any additional studies.   REVIEW OF SYSTEMS: [name] reports doing okay overall. She states she "drinks [water] like crazy," but she gets up 5-6 times per night to go to the bathroom. She reports trying to stay active by walking with husband, [name]. [name] has to wake up at 3:30 am to go to work during the week. The patient denies unusual headaches, visual changes, nausea, vomiting, stiff neck, dizziness,  or gait imbalance. There has been no cough, phlegm production, or pleurisy, no chest pain or pressure, and no change in bowel or bladder habits. The patient denies fever, rash, bleeding, unexplained fatigue or unexplained weight loss. A detailed review of systems was otherwise entirely negative.   HISTORY OF CURRENT ILLNESS: "Doris Smith" presented to her PCP, Dr. Hassell Done, with right breast pain, fullness, and nipple inversion since November 2019. She proceeded to undergo bilateral diagnostic mammography with tomography and right breast ultrasonography at [facility] on 07/31/2018 showing: findings compatible with multicentric breast cancer spanning at least the upper inner and upper outer quadrants of the right breast; normal right axilla. Palpable firmness of approximately 1.5 cm was noted in the 9 o'clock position, which was also seen on ultrasound with indisctinct margins measuring 2.7 x 2.3 x 2.2 cm. At 1 o'clock, a mass with poorly defined margins measures 1.9 x 1 x 0.8 cm. Additional poorly defined masses were seen in the 12 o'clock retroareolar region.  Accordingly on 08/02/2018 she proceeded to biopsy of the right breast area in question. The pathology ([id number]) from this procedure showed: invasive mammary carcinoma at 9 o'clock and 1 o'clock; e-cadherin negative, consistent with lobular phenotype; grade 2-3. Prognostic indicators significant for: estrogen receptor, 90% positive and progesterone receptor, 1% positive, both with strong staining intensity. Proliferation marker Ki67 at 1%. HER2 equivocal by immunohistochemistry, 2+ but negative by fluorescent in situ hybridization with a signals ratio 1.04 and number per cell 1.2.   The patient's subsequent history is as detailed below.   PAST MEDICAL HISTORY: Past Medical History:  Diagnosis Date   A-fib ([place]) 11/2012   PAF   Anxiety    takes Ativan   Asthma 04/07/2011   dx  Bipolar disorder ([place])    Cancer ([place])    Right breast    Depression    Dyspnea    DOE   Early cataracts, bilateral    Fatty liver    Fibromyalgia    Fracture, ankle 10/2008   right and left   GERD (gastroesophageal reflux disease)    Irritable bowel syndrome    Mental disorder    dx bipolar   PONV (postoperative nausea and vomiting)    Sleep apnea 04/2011    does not wear CPAP   Wears glasses   She has chronic sinus headaches, hiatal hernia   PAST SURGICAL HISTORY: Past Surgical History:  Procedure Laterality Date   ABDOMINAL HYSTERECTOMY     COLONOSCOPY     DILATION AND CURETTAGE OF UTERUS  1981   abnormal pap   KNEE ARTHROSCOPY Left 2007   x 2   LUMBAR LAMINECTOMY/DECOMPRESSION MICRODISCECTOMY  05/31/2011   Procedure: LUMBAR LAMINECTOMY/DECOMPRESSION MICRODISCECTOMY;  Surgeon: Johnn Hai;  Location: [facility];  Service: Orthopedics;  Laterality: Left;  Decompression Lumbar four to five and  Lumbar five to Sacral one on Left  (X-Ray)   MASTECTOMY W/ SENTINEL NODE BIOPSY Right 09/16/2018   Procedure: RIGHT MASTECTOMY WITH RIGHT AXILLARY SENTINEL LYMPH NODE BIOPSY;  Surgeon: Alphonsa Overall, MD;  Location: [facility];  Service: General;  Laterality: Right;   PARTIAL HYSTERECTOMY  1982   PORTACATH PLACEMENT Left 10/31/2018   Procedure: INSERTION PORT-A-CATH WITH ULTRASOUND;  Surgeon: Alphonsa Overall, MD;  Location: [facility];  Service: General;  Laterality: Left;   TONSILLECTOMY     as child   TOTAL KNEE ARTHROPLASTY Left 12/18/2005    FAMILY HISTORY Family History  Problem Relation Age of Onset   Anesthesia problems Mother    Heart disease Mother        CHF, atrial fib   Heart failure Mother    Anesthesia problems Sister    Colon polyps Father    Heart disease Father        "Fluid around the heart"   Hypertension Sister    Breast cancer Maternal Aunt    Colon cancer Maternal Aunt    Prostate cancer Neg Hx    Ovarian cancer Neg Hx    As of February 2019, patient father is alive at  52 years old. Patient mother is alive at 77 years old. She notes a family hx of breast cancer. A maternal aunt was diagnosed with breast cancer, but Doris Smith is unsure if it was in one or both breasts. She has 3 siblings, 3 sisters and 0 brothers.   GYNECOLOGIC HISTORY:  No LMP recorded. Patient is postmenopausal. Menarche: 68 years old Age at first live birth: 68 years old [place] P 2 LMP about 42 years ago Contraceptive no HRT no  Hysterectomy? partial So? no   SOCIAL HISTORY: Doris Smith worked in [employer] for 15 years. She is on disability because her back, knees, and nerves. Her husband, [name], has been at [employer] 43 years as a Freight forwarder.  Even though their home is in [place] they are actually living in [place] in an apartment while her husband works there.  Son Legrand Como, age 93, works as a Chief Strategy Officer in [place], [place]. Son Shanon Brow, age 22, works as a Building control surveyor in [place], [place]. They have 4 grandchildren, 2 great-grandchildren with 2 more on the way. She attends a [employer].     ADVANCED DIRECTIVES:    HEALTH MAINTENANCE: Social History   Tobacco  Use   Smoking status: Never Smoker   Smokeless tobacco: Never Used  Substance Use Topics   Alcohol use: No   Drug use: No     Colonoscopy: 2019  PAP: 11/03/2014, normal  Bone density: 10/13/2016, T-score of -2.1   Allergies  Allergen Reactions   Iohexol     "over 20 years ago" had reaction "hurting in arm and heart"-Done in [place], [place].Marland Kitchenphysician stopped CT at that time.   Codeine Other (See Comments)    Makes feel like she is wild    Prednisone Other (See Comments)    Makes me very irritable   Sulfonamide Derivatives Other (See Comments)    Upset stomach   Celecoxib Nausea And Vomiting   Sulfa Antibiotics Nausea And Vomiting   Vitamin B12 Rash    Current Outpatient Medications  Medication Sig Dispense Refill   benzonatate (TESSALON PERLES) 100 MG capsule Take 1 capsule (100 mg total) by mouth 3 (three) times  daily as needed for cough. 20 capsule 0   budesonide-formoterol (SYMBICORT) 160-4.5 MCG/ACT inhaler Inhale 2 puffs into the lungs 2 (two) times daily.     clotrimazole-betamethasone (LOTRISONE) cream Apply 1 application topically 2 (two) times daily. (Patient taking differently: Apply 1 application topically 2 (two) times daily as needed (rash). ) 30 g 0   dexamethasone (DECADRON) 4 MG tablet Take 1 tablet (4 mg total) by mouth daily. Start the day after chemotherapy for 2 days. Take with food. 30 tablet 1   esomeprazole (NEXIUM) 40 MG capsule TAKE 1 CAPSULE BY MOUTH ONCE DAILY 30 MINUTES BEFORE BREAKFAST 30 capsule 0   fluticasone (FLONASE) 50 MCG/ACT nasal spray Place 2 sprays into both nostrils daily. 16 g 6   fluticasone furoate-vilanterol (BREO ELLIPTA) 200-25 MCG/INH AEPB Inhale 1 puff into the lungs daily.     gabapentin (NEURONTIN) 300 MG capsule Take 1 capsule (300 mg total) by mouth 3 (three) times daily. (Patient taking differently: Take 300 mg by mouth 2 (two) times daily. ) 90 capsule 5   lamoTRIgine (LAMICTAL) 100 MG tablet Take 100 mg by mouth 2 (two) times daily.      lidocaine-prilocaine (EMLA) cream Apply to affected area once 30 g 3   LORazepam (ATIVAN) 0.5 MG tablet Take 1 tablet (0.5 mg total) by mouth 2 (two) times daily as needed. 30 tablet 2   LORazepam (ATIVAN) 0.5 MG tablet Take 1 tablet (0.5 mg total) by mouth at bedtime as needed (Nausea or vomiting). 30 tablet 0   montelukast (SINGULAIR) 10 MG tablet Take 10 mg by mouth at bedtime.     prochlorperazine (COMPAZINE) 10 MG tablet Take 1 tablet (10 mg total) by mouth every 6 (six) hours as needed (Nausea or vomiting). 30 tablet 1   traZODone (DESYREL) 50 MG tablet Take 50 mg by mouth at bedtime.     Vilazodone HCl (VIIBRYD) 40 MG TABS Take 40 mg by mouth daily.     No current facility-administered medications for this visit.     OBJECTIVE: Middle-aged white woman in no acute distress Vitals:   11/12/18  1342  BP: 124/66  Pulse: 74  Resp: 18  Temp: (!) 97 F (36.1 C)  SpO2: 100%     Body mass index is 36.56 kg/m.   Wt Readings from Last 3 Encounters:  11/12/18 213 lb (96.6 kg)  11/05/18 216 lb (98 kg)  10/31/18 212 lb 4.9 oz (96.3 kg)      ECOG FS:1 - Symptomatic but completely ambulatory  Sclerae  unicteric, EOMs intact Oropharynx clear and moist No cervical or supraclavicular adenopathy Lungs no rales or rhonchi Heart regular rate and rhythm Abd soft, nontender, positive bowel sounds MSK no focal spinal tenderness, no upper extremity lymphedema Neuro: nonfocal, well oriented, appropriate affect Breasts: Deferred    LAB RESULTS:  CMP     Component Value Date/Time   NA 143 11/05/2018 0902   NA 142 10/13/2016 1045   K 4.2 11/05/2018 0902   CL 107 11/05/2018 0902   CO2 27 11/05/2018 0902   GLUCOSE 93 11/05/2018 0902   BUN 13 11/05/2018 0902   BUN 8 10/13/2016 1045   CREATININE 0.86 11/05/2018 0902   CREATININE 0.83 08/14/2018 0758   CALCIUM 8.6 (L) 11/05/2018 0902   PROT 6.6 11/05/2018 0902   PROT 6.3 10/13/2016 1045   ALBUMIN 3.5 11/05/2018 0902   ALBUMIN 3.9 10/13/2016 1045   AST 15 11/05/2018 0902   AST 14 (L) 08/14/2018 0758   ALT 11 11/05/2018 0902   ALT 10 08/14/2018 0758   ALKPHOS 95 11/05/2018 0902   BILITOT 0.4 11/05/2018 0902   BILITOT 0.5 08/14/2018 0758   GFRNONAA >60 11/05/2018 0902   GFRNONAA >60 08/14/2018 0758   GFRAA >60 11/05/2018 0902   GFRAA >60 08/14/2018 0758    No results found for: TOTALPROTELP, ALBUMINELP, A1GS, A2GS, BETS, BETA2SER, GAMS, MSPIKE, SPEI  No results found for: KPAFRELGTCHN, LAMBDASER, KAPLAMBRATIO  Lab Results  Component Value Date   WBC 8.8 11/05/2018   NEUTROABS 6.3 11/05/2018   HGB 12.2 11/05/2018   HCT 37.6 11/05/2018   MCV 90.4 11/05/2018   PLT 232 11/05/2018    '@LASTCHEMISTRY'$ @  No results found for: LABCA2  No components found for: QPYPPJ093  No results for input(s): INR in the last 168  hours.  No results found for: LABCA2  No results found for: OIZ124  No results found for: PYK998  No results found for: PJA250  No results found for: CA2729  No components found for: HGQUANT  No results found for: CEA1 / No results found for: CEA1   No results found for: AFPTUMOR  No results found for: CHROMOGRNA  No results found for: PSA1  No visits with results within 3 Day(s) from this visit.  Latest known visit with results is:  Appointment on 11/05/2018  Component Date Value Ref Range Status   WBC 11/05/2018 8.8  4.0 - 10.5 K/uL Final   RBC 11/05/2018 4.16  3.87 - 5.11 MIL/uL Final   Hemoglobin 11/05/2018 12.2  12.0 - 15.0 g/dL Final   HCT 11/05/2018 37.6  36.0 - 46.0 % Final   MCV 11/05/2018 90.4  80.0 - 100.0 fL Final   MCH 11/05/2018 29.3  26.0 - 34.0 pg Final   MCHC 11/05/2018 32.4  30.0 - 36.0 g/dL Final   RDW 11/05/2018 13.6  11.5 - 15.5 % Final   Platelets 11/05/2018 232  150 - 400 K/uL Final   nRBC 11/05/2018 0.0  0.0 - 0.2 % Final   Neutrophils Relative % 11/05/2018 71  % Final   Neutro Abs 11/05/2018 6.3  1.7 - 7.7 K/uL Final   Lymphocytes Relative 11/05/2018 15  % Final   Lymphs Abs 11/05/2018 1.3  0.7 - 4.0 K/uL Final   Monocytes Relative 11/05/2018 10  % Final   Monocytes Absolute 11/05/2018 0.9  0.1 - 1.0 K/uL Final   Eosinophils Relative 11/05/2018 3  % Final   Eosinophils Absolute 11/05/2018 0.2  0.0 - 0.5 K/uL Final   Basophils  Relative 11/05/2018 1  % Final   Basophils Absolute 11/05/2018 0.1  0.0 - 0.1 K/uL Final   Immature Granulocytes 11/05/2018 0  % Final   Abs Immature Granulocytes 11/05/2018 0.02  0.00 - 0.07 K/uL Final   Performed at [facility], [place] [address], [place], [place] [address]   Sodium 11/05/2018 143  135 - 145 mmol/L Final   Potassium 11/05/2018 4.2  3.5 - 5.1 mmol/L Final   Chloride 11/05/2018 107  98 - 111 mmol/L Final   CO2 11/05/2018 27  22 - 32 mmol/L Final    Glucose, Bld 11/05/2018 93  70 - 99 mg/dL Final   BUN 11/05/2018 13  8 - 23 mg/dL Final   Creatinine, Ser 11/05/2018 0.86  0.44 - 1.00 mg/dL Final   Calcium 11/05/2018 8.6* 8.9 - 10.3 mg/dL Final   Total Protein 11/05/2018 6.6  6.5 - 8.1 g/dL Final   Albumin 11/05/2018 3.5  3.5 - 5.0 g/dL Final   AST 11/05/2018 15  15 - 41 U/L Final   ALT 11/05/2018 11  0 - 44 U/L Final   Alkaline Phosphatase 11/05/2018 95  38 - 126 U/L Final   Total Bilirubin 11/05/2018 0.4  0.3 - 1.2 mg/dL Final   GFR calc non Af Amer 11/05/2018 >60  >60 mL/min Final   GFR calc Af Amer 11/05/2018 >60  >60 mL/min Final   Anion gap 11/05/2018 9  5 - 15 Final   Performed at [facility], [address] [place], [place] [address]    (this displays the last labs from the last 3 days)  No results found for: TOTALPROTELP, ALBUMINELP, A1GS, A2GS, BETS, BETA2SER, GAMS, MSPIKE, SPEI (this displays SPEP labs)  No results found for: KPAFRELGTCHN, LAMBDASER, KAPLAMBRATIO (kappa/lambda light chains)  No results found for: HGBA, HGBA2QUANT, HGBFQUANT, HGBSQUAN (Hemoglobinopathy evaluation)   No results found for: LDH  No results found for: IRON, TIBC, IRONPCTSAT (Iron and TIBC)  No results found for: FERRITIN  Urinalysis    Component Value Date/Time   COLORURINE YELLOW 05/25/2011 0923   APPEARANCEUR Clear 05/03/2018 1204   LABSPEC 1.011 05/25/2011 0923   PHURINE 7.0 05/25/2011 0923   GLUCOSEU Negative 05/03/2018 [place] 05/25/2011 0923   BILIRUBINUR Negative 05/03/2018 [place] 05/25/2011 0923   PROTEINUR Negative 05/03/2018 [place] 05/25/2011 0923   UROBILINOGEN negative 11/03/2014 1039   UROBILINOGEN 0.2 05/25/2011 0923   NITRITE Negative 05/03/2018 1204   NITRITE NEGATIVE 05/25/2011 0923   LEUKOCYTESUR Negative 05/03/2018 1204     STUDIES: Dg Chest Port 1 View  Result Date: 10/31/2018 CLINICAL DATA:  Port-A-Cath  placement.  Right breast carcinoma EXAM: PORTABLE CHEST 1 VIEW COMPARISON:  Chest radiograph February 22, 2018 and chest CT August 23, 2018 FINDINGS: Port-A-Cath tip is in the superior vena cava. No pneumothorax. No edema or consolidation. Heart is upper normal in size with pulmonary vascularity normal. There is a sizable hiatal hernia. There is aortic atherosclerosis. No adenopathy. No appreciable bone lesions. IMPRESSION: Port-A-Cath tip is in the superior vena cava. No pneumothorax. No edema or consolidation. Stable cardiac silhouette. Sizable hiatal hernia present. Aortic Atherosclerosis (ICD10-I70.0). Electronically Signed   By: Lowella Grip III M.D.   On: 10/31/2018 11:30   Dg Fluoro Guide Cv Line-no Report  Result Date: 10/31/2018 Fluoroscopy was utilized by the requesting physician.  No radiographic interpretation.    ELIGIBLE FOR AVAILABLE RESEARCH PROTOCOL: no   ASSESSMENT:  68 y.o. [place], [place] woman status post right breast biopsy 08/02/2018 for a clinical T3 N0, stage IIA invasive lobular carcinoma, grade 2, estrogen receptor strongly positive, progesterone receptor 1% positive, with no HER-2 amplification and an MIB-1 of 1%.  (a) CT scan of the head and chest, without contrast 08/23/2018 showed nonspecific 0.4 cm left lower lobe pulmonary nodule, no definitive metastatic disease  (b) bone scan 08/23/2018 shows multiple spinal areas of abnormal uptake, but  (c) total spinal MRI 09/03/2018 finds bone scan findings to be secondary to degenerative disease, no evidence of metastatic disease.  (1) status post right mastectomy and sentinel lymph node sampling 09/16/2018 showing a pT3 pN1(mic), stage IIA-IIIA invasive lobular breast cancer, grade 2, with 1 of 5 sampled lymph nodes involved by micrometastatic deposit, with extra nodular extension; ample margins  (2)  MammaPrint "high risk" suggests a 5-year metastasis free survival of 93% with chemotherapy, with an absolute chemotherapy  benefit in the greater than 12% range  (3) adjuvant radiation to follow (can be done concurrently with chemotherapy)  (4) anastrozole started neoadjuvantly (on 08/14/2018), discontinued 10/14/2018 in preparation for chemotherapy  (5) Caris requested 10/14/2018  (6) adjuvant chemotherapy consisting of cyclophosphamide, methotrexate, fluorouracil (CMF) started 11/05/2018, to be repeated every 21 days x 8   PLAN: Cindy tolerated the first cycle of CMF generally well.  She did have some symptoms mostly exacerbations of previous issues including fatigue, insomnia, and a queasy stomach.  She now has 2 weeks where she should be fairly normal before receiving cycle #2.  Since her counts today are so good I do not think we need to see her in between treatments.  I reassured her that she can drive herself here and back.  Very likely we will start her radiation treatment with cycle 3 or 4.  She is interested in losing weight.  We discussed diet issues today.  She knows to call for any other issues that may develop before the next visit.   Seiya Silsby, Virgie Dad, MD  11/12/18 2:10 PM Medical Oncology and Hematology [facility] [address] [place], [place] [address] Tel. [phone]    Fax. [phone]    I, Wilburn Mylar, am acting as scribe for Dr. Virgie Dad. Jaclynne Baldo.  I, Lurline Del MD, have reviewed the above documentation for accuracy and completeness, and I agree with the above.

## 2018-11-15 ENCOUNTER — Telehealth: Payer: Self-pay | Admitting: *Deleted

## 2018-11-15 NOTE — Telephone Encounter (Signed)
Caris request faxed with appropriate data.

## 2018-11-25 NOTE — Progress Notes (Signed)
Doris Smith  [phone] [phone]     ID: Doris Smith DOB: [birth year]  MR#: [id number]  FTD#:[id number]  Patient Care Team: Chevis Pretty, FNP as PCP - General (Nurse Practitioner) Alphonsa Overall, MD as Consulting Physician (General Surgery) Jamiere Gulas, Virgie Dad, MD as Consulting Physician (Oncology) Kyung Rudd, MD as Consulting Physician (Radiation Oncology) Elijio Miles, MD as Consulting Physician (Pulmonary Disease) Netta Cedars, MD as Consulting Physician (Orthopedic Surgery) Susa Day, MD as Consulting Physician (Orthopedic Surgery) Janeth Rase, NP as Nurse Practitioner (Adult Health Nurse Practitioner) Mauro Kaufmann, RN as Oncology Nurse Navigator Rockwell Germany, RN as Oncology Nurse Navigator Irene Limbo, MD as Consulting Physician (Plastic Surgery) Chauncey Cruel, MD OTHER MD: Chapman Moss, psychiatry   CHIEF COMPLAINT: Estrogen receptor positive breast cancer  CURRENT TREATMENT: Adjuvant chemotherapy   INTERVAL HISTORY: Doris Smith is seen today for follow-up and treatment of her estrogen receptor positive breast cancer.  She continues on cyclophosphamide, methotrexate, fluorouracil (CMF), to be repeated every 21 days x 8. Today is day 1 cycle 2. She reports fatigue, with associated apathy. She notes a history of depression, but she feels as if this is amplified. She also reports nausea and more hair shedding. She denies vomiting.  Since her last visit here, she has not undergone any additional studies.   REVIEW OF SYSTEMS: Aadvika reports doing okay overall. She reports walking for exercise, which she loves to do by herself. She reports she hates looking at her chest, and that nothing she wears is comfortable. She reports sleeping with a pillow, which she calls "Buddy," that is comforting to her. She also notes a sharp pain to her breast and continued knee pain. She drinks ginger ale for nausea and  green tea.  The patient denies unusual headaches, visual changes, nausea, vomiting, stiff neck, dizziness, or gait imbalance. There has been no cough, phlegm production, or pleurisy, no chest pain or pressure, and no change in bowel or bladder habits. The patient denies fever, rash, bleeding, unexplained fatigue or unexplained weight loss. A detailed review of systems was otherwise entirely negative.   HISTORY OF CURRENT ILLNESS: "Doris Smith" presented to her PCP, Dr. Hassell Done, with right breast pain, fullness, and nipple inversion since November 2019. She proceeded to undergo bilateral diagnostic mammography with tomography and right breast ultrasonography at [facility] on 07/31/2018 showing: findings compatible with multicentric breast cancer spanning at least the upper inner and upper outer quadrants of the right breast; normal right axilla. Palpable firmness of approximately 1.5 cm was noted in the 9 o'clock position, which was also seen on ultrasound with indisctinct margins measuring 2.7 x 2.3 x 2.2 cm. At 1 o'clock, a mass with poorly defined margins measures 1.9 x 1 x 0.8 cm. Additional poorly defined masses were seen in the 12 o'clock retroareolar region.  Accordingly on 08/02/2018 she proceeded to biopsy of the right breast area in question. The pathology ([id number]) from this procedure showed: invasive mammary carcinoma at 9 o'clock and 1 o'clock; e-cadherin negative, consistent with lobular phenotype; grade 2-3. Prognostic indicators significant for: estrogen receptor, 90% positive and progesterone receptor, 1% positive, both with strong staining intensity. Proliferation marker Ki67 at 1%. HER2 equivocal by immunohistochemistry, 2+ but negative by fluorescent in situ hybridization with a signals ratio 1.04 and number per cell 1.2.   The patient's subsequent history is as detailed below.   PAST MEDICAL HISTORY: Past Medical History:  Diagnosis Date   A-fib ([place]) 11/2012   PAF  Anxiety      takes Ativan   Asthma 04/07/2011   dx   Bipolar disorder ([place])    Cancer ([place])    Right breast   Depression    Dyspnea    DOE   Early cataracts, bilateral    Fatty liver    Fibromyalgia    Fracture, ankle 10/2008   right and left   GERD (gastroesophageal reflux disease)    Irritable bowel syndrome    Mental disorder    dx bipolar   PONV (postoperative nausea and vomiting)    Sleep apnea 04/2011    does not wear CPAP   Wears glasses   She has chronic sinus headaches, hiatal hernia   PAST SURGICAL HISTORY: Past Surgical History:  Procedure Laterality Date   ABDOMINAL HYSTERECTOMY     COLONOSCOPY     DILATION AND CURETTAGE OF UTERUS  1981   abnormal pap   KNEE ARTHROSCOPY Left 2007   x 2   LUMBAR LAMINECTOMY/DECOMPRESSION MICRODISCECTOMY  05/31/2011   Procedure: LUMBAR LAMINECTOMY/DECOMPRESSION MICRODISCECTOMY;  Surgeon: Johnn Hai;  Location: [facility];  Service: Orthopedics;  Laterality: Left;  Decompression Lumbar four to five and  Lumbar five to Sacral one on Left  (X-Ray)   MASTECTOMY W/ SENTINEL NODE BIOPSY Right 09/16/2018   Procedure: RIGHT MASTECTOMY WITH RIGHT AXILLARY SENTINEL LYMPH NODE BIOPSY;  Surgeon: Alphonsa Overall, MD;  Location: [facility];  Service: General;  Laterality: Right;   PARTIAL HYSTERECTOMY  1982   PORTACATH PLACEMENT Left 10/31/2018   Procedure: INSERTION PORT-A-CATH WITH ULTRASOUND;  Surgeon: Alphonsa Overall, MD;  Location: [facility];  Service: General;  Laterality: Left;   TONSILLECTOMY     as child   TOTAL KNEE ARTHROPLASTY Left 12/18/2005    FAMILY HISTORY Family History  Problem Relation Age of Onset   Anesthesia problems Mother    Heart disease Mother        CHF, atrial fib   Heart failure Mother    Anesthesia problems Sister    Colon polyps Father    Heart disease Father        "Fluid around the heart"   Hypertension Sister    Breast cancer Maternal Aunt    Colon cancer Maternal  Aunt    Prostate cancer Neg Hx    Ovarian cancer Neg Hx    As of February 2019, patient father is alive at 54 years old. Patient mother is alive at 45 years old. She notes a family hx of breast cancer. A maternal aunt was diagnosed with breast cancer, but Doris Smith is unsure if it was in one or both breasts. She has 3 siblings, 3 sisters and 0 brothers.   GYNECOLOGIC HISTORY:  No LMP recorded. Patient is postmenopausal. Menarche: 68 years old Age at first live birth: 68 years old [place] P 2 LMP about 42 years ago Contraceptive no HRT no  Hysterectomy? partial So? no   SOCIAL HISTORY: Doris Smith worked in [employer] for 15 years. She is on disability because her back, knees, and nerves. Her husband, [name], has been at [employer] 43 years as a Freight forwarder.  Even though their home is in [place] they are actually living in [place] in an apartment while her husband works there.  Son Legrand Como, age 3, works as a Chief Strategy Officer in [place], [place]. Son Shanon Brow, age 6, works as a Building control surveyor in [place], [place]. They have 4 grandchildren, 2 great-grandchildren with 2 more on the way. She attends a [employer].  ADVANCED DIRECTIVES:    HEALTH MAINTENANCE: Social History   Tobacco Use   Smoking status: Never Smoker   Smokeless tobacco: Never Used  Substance Use Topics   Alcohol use: No   Drug use: No     Colonoscopy: 2019  PAP: 11/03/2014, normal  Bone density: 10/13/2016, T-score of -2.1   Allergies  Allergen Reactions   Iohexol     "over 20 years ago" had reaction "hurting in arm and heart"-Done in [place], [place].Marland Kitchenphysician stopped CT at that time.   Codeine Other (See Comments)    Makes feel like she is wild    Prednisone Other (See Comments)    Makes me very irritable   Sulfonamide Derivatives Other (See Comments)    Upset stomach   Celecoxib Nausea And Vomiting   Sulfa Antibiotics Nausea And Vomiting   Vitamin B12 Rash    Current Outpatient Medications  Medication Sig  Dispense Refill   benzonatate (TESSALON PERLES) 100 MG capsule Take 1 capsule (100 mg total) by mouth 3 (three) times daily as needed for cough. 20 capsule 0   budesonide-formoterol (SYMBICORT) 160-4.5 MCG/ACT inhaler Inhale 2 puffs into the lungs 2 (two) times daily.     clotrimazole-betamethasone (LOTRISONE) cream Apply 1 application topically 2 (two) times daily. (Patient taking differently: Apply 1 application topically 2 (two) times daily as needed (rash). ) 30 g 0   dexamethasone (DECADRON) 4 MG tablet Take 1 tablet (4 mg total) by mouth daily. Start the day after chemotherapy for 2 days. Take with food. 30 tablet 1   esomeprazole (NEXIUM) 40 MG capsule TAKE 1 CAPSULE BY MOUTH ONCE DAILY 30 MINUTES BEFORE BREAKFAST 30 capsule 0   fluticasone (FLONASE) 50 MCG/ACT nasal spray Place 2 sprays into both nostrils daily. 16 g 6   fluticasone furoate-vilanterol (BREO ELLIPTA) 200-25 MCG/INH AEPB Inhale 1 puff into the lungs daily.     gabapentin (NEURONTIN) 300 MG capsule Take 1 capsule (300 mg total) by mouth 3 (three) times daily. (Patient taking differently: Take 300 mg by mouth 2 (two) times daily. ) 90 capsule 5   lamoTRIgine (LAMICTAL) 100 MG tablet Take 100 mg by mouth 2 (two) times daily.      lidocaine-prilocaine (EMLA) cream Apply to affected area once 30 g 3   LORazepam (ATIVAN) 0.5 MG tablet Take 1 tablet (0.5 mg total) by mouth 2 (two) times daily as needed. 30 tablet 2   LORazepam (ATIVAN) 0.5 MG tablet Take 1 tablet (0.5 mg total) by mouth at bedtime as needed (Nausea or vomiting). 30 tablet 0   montelukast (SINGULAIR) 10 MG tablet Take 10 mg by mouth at bedtime.     prochlorperazine (COMPAZINE) 10 MG tablet Take 1 tablet (10 mg total) by mouth every 6 (six) hours as needed (Nausea or vomiting). 30 tablet 1   traZODone (DESYREL) 50 MG tablet Take 50 mg by mouth at bedtime.     Vilazodone HCl (VIIBRYD) 40 MG TABS Take 40 mg by mouth daily.     No current  facility-administered medications for this visit.     OBJECTIVE: Middle-aged white woman who appears stated age 31:   11/26/18 1003  BP: 118/60  Pulse: 75  Resp: 18  Temp: 98.3 F (36.8 C)  SpO2: 100%     Body mass index is 37.42 kg/m.   Wt Readings from Last 3 Encounters:  11/26/18 218 lb (98.9 kg)  11/12/18 213 lb (96.6 kg)  11/05/18 216 lb (98 kg)  ECOG FS:1 - Symptomatic but completely ambulatory  Sclerae unicteric, pupils round and equal Wearing a mask No cervical or supraclavicular adenopathy Lungs no rales or rhonchi Heart regular rate and rhythm Abd soft, nontender, positive bowel sounds MSK no focal spinal tenderness, no upper extremity lymphedema Neuro: nonfocal, well oriented, appropriate affect Breasts: The right breast is status post mastectomy.  There is no evidence of local recurrence.  The incision is healed very nicely.  The left breast is unremarkable.  Both axillae are benign.   LAB RESULTS:  CMP     Component Value Date/Time   NA 144 11/26/2018 0940   NA 142 10/13/2016 1045   K 4.1 11/26/2018 0940   CL 108 11/26/2018 0940   CO2 28 11/26/2018 0940   GLUCOSE 93 11/26/2018 0940   BUN 9 11/26/2018 0940   BUN 8 10/13/2016 1045   CREATININE 1.01 (H) 11/26/2018 0940   CREATININE 0.83 08/14/2018 0758   CALCIUM 8.3 (L) 11/26/2018 0940   PROT 6.4 (L) 11/26/2018 0940   PROT 6.3 10/13/2016 1045   ALBUMIN 3.3 (L) 11/26/2018 0940   ALBUMIN 3.9 10/13/2016 1045   AST 16 11/26/2018 0940   AST 14 (L) 08/14/2018 0758   ALT 13 11/26/2018 0940   ALT 10 08/14/2018 0758   ALKPHOS 106 11/26/2018 0940   BILITOT 0.2 (L) 11/26/2018 0940   BILITOT 0.5 08/14/2018 0758   GFRNONAA 58 (L) 11/26/2018 0940   GFRNONAA >60 08/14/2018 0758   GFRAA >60 11/26/2018 0940   GFRAA >60 08/14/2018 0758    No results found for: TOTALPROTELP, ALBUMINELP, A1GS, A2GS, BETS, BETA2SER, GAMS, MSPIKE, SPEI  No results found for: KPAFRELGTCHN, LAMBDASER, KAPLAMBRATIO  Lab  Results  Component Value Date   WBC 5.9 11/26/2018   NEUTROABS 3.0 11/26/2018   HGB 10.9 (L) 11/26/2018   HCT 34.2 (L) 11/26/2018   MCV 90.2 11/26/2018   PLT 263 11/26/2018    '@LASTCHEMISTRY' @  No results found for: LABCA2  No components found for: QJJHER740  No results for input(s): INR in the last 168 hours.  No results found for: LABCA2  No results found for: CXK481  No results found for: EHU314  No results found for: HFW263  No results found for: CA2729  No components found for: HGQUANT  No results found for: CEA1 / No results found for: CEA1   No results found for: AFPTUMOR  No results found for: CHROMOGRNA  No results found for: PSA1  Appointment on 11/26/2018  Component Date Value Ref Range Status   Sodium 11/26/2018 144  135 - 145 mmol/L Final   Potassium 11/26/2018 4.1  3.5 - 5.1 mmol/L Final   Chloride 11/26/2018 108  98 - 111 mmol/L Final   CO2 11/26/2018 28  22 - 32 mmol/L Final   Glucose, Bld 11/26/2018 93  70 - 99 mg/dL Final   BUN 11/26/2018 9  8 - 23 mg/dL Final   Creatinine, Ser 11/26/2018 1.01* 0.44 - 1.00 mg/dL Final   Calcium 11/26/2018 8.3* 8.9 - 10.3 mg/dL Final   Total Protein 11/26/2018 6.4* 6.5 - 8.1 g/dL Final   Albumin 11/26/2018 3.3* 3.5 - 5.0 g/dL Final   AST 11/26/2018 16  15 - 41 U/L Final   ALT 11/26/2018 13  0 - 44 U/L Final   Alkaline Phosphatase 11/26/2018 106  38 - 126 U/L Final   Total Bilirubin 11/26/2018 0.2* 0.3 - 1.2 mg/dL Final   GFR calc non Af Amer 11/26/2018 58* >60 mL/min Final  GFR calc Af Amer 11/26/2018 >60  >60 mL/min Final   Anion gap 11/26/2018 8  5 - 15 Final   Performed at [facility], [place] [address], [place], [place] [address]   WBC 11/26/2018 5.9  4.0 - 10.5 K/uL Final   RBC 11/26/2018 3.79* 3.87 - 5.11 MIL/uL Final   Hemoglobin 11/26/2018 10.9* 12.0 - 15.0 g/dL Final   HCT 11/26/2018 34.2* 36.0 - 46.0 % Final   MCV 11/26/2018 90.2  80.0 - 100.0 fL  Final   MCH 11/26/2018 28.8  26.0 - 34.0 pg Final   MCHC 11/26/2018 31.9  30.0 - 36.0 g/dL Final   RDW 11/26/2018 13.8  11.5 - 15.5 % Final   Platelets 11/26/2018 263  150 - 400 K/uL Final   nRBC 11/26/2018 0.5* 0.0 - 0.2 % Final   Neutrophils Relative % 11/26/2018 51  % Final   Neutro Abs 11/26/2018 3.0  1.7 - 7.7 K/uL Final   Lymphocytes Relative 11/26/2018 21  % Final   Lymphs Abs 11/26/2018 1.2  0.7 - 4.0 K/uL Final   Monocytes Relative 11/26/2018 19  % Final   Monocytes Absolute 11/26/2018 1.1* 0.1 - 1.0 K/uL Final   Eosinophils Relative 11/26/2018 2  % Final   Eosinophils Absolute 11/26/2018 0.1  0.0 - 0.5 K/uL Final   Basophils Relative 11/26/2018 2  % Final   Basophils Absolute 11/26/2018 0.1  0.0 - 0.1 K/uL Final   Immature Granulocytes 11/26/2018 5  % Final   Abs Immature Granulocytes 11/26/2018 0.31* 0.00 - 0.07 K/uL Final   Performed at [facility], [place] [address] [place], [place] [address]    (this displays the last labs from the last 3 days)  No results found for: TOTALPROTELP, ALBUMINELP, A1GS, A2GS, BETS, BETA2SER, GAMS, MSPIKE, SPEI (this displays SPEP labs)  No results found for: KPAFRELGTCHN, LAMBDASER, KAPLAMBRATIO (kappa/lambda light chains)  No results found for: HGBA, HGBA2QUANT, HGBFQUANT, HGBSQUAN (Hemoglobinopathy evaluation)   No results found for: LDH  No results found for: IRON, TIBC, IRONPCTSAT (Iron and TIBC)  No results found for: FERRITIN  Urinalysis    Component Value Date/Time   COLORURINE YELLOW 05/25/2011 0923   APPEARANCEUR Clear 05/03/2018 1204   LABSPEC 1.011 05/25/2011 0923   PHURINE 7.0 05/25/2011 0923   GLUCOSEU Negative 05/03/2018 [place] 05/25/2011 0923   BILIRUBINUR Negative 05/03/2018 [place] 05/25/2011 0923   PROTEINUR Negative 05/03/2018 [place] 05/25/2011 0923   UROBILINOGEN negative 11/03/2014 1039   UROBILINOGEN 0.2  05/25/2011 0923   NITRITE Negative 05/03/2018 1204   NITRITE NEGATIVE 05/25/2011 0923   LEUKOCYTESUR Negative 05/03/2018 1204     STUDIES: Dg Chest Port 1 View  Result Date: 10/31/2018 CLINICAL DATA:  Port-A-Cath placement.  Right breast carcinoma EXAM: PORTABLE CHEST 1 VIEW COMPARISON:  Chest radiograph February 22, 2018 and chest CT August 23, 2018 FINDINGS: Port-A-Cath tip is in the superior vena cava. No pneumothorax. No edema or consolidation. Heart is upper normal in size with pulmonary vascularity normal. There is a sizable hiatal hernia. There is aortic atherosclerosis. No adenopathy. No appreciable bone lesions. IMPRESSION: Port-A-Cath tip is in the superior vena cava. No pneumothorax. No edema or consolidation. Stable cardiac silhouette. Sizable hiatal hernia present. Aortic Atherosclerosis (ICD10-I70.0). Electronically Signed   By: Lowella Grip III M.D.   On: 10/31/2018 11:30   Dg Fluoro Guide Cv Line-no Report  Result Date: 10/31/2018 Fluoroscopy was utilized by  the requesting physician.  No radiographic interpretation.    ELIGIBLE FOR AVAILABLE RESEARCH PROTOCOL: no   ASSESSMENT: 68 y.o. [place], [place] woman status post right breast biopsy 08/02/2018 for a clinical T3 N0, stage IIA invasive lobular carcinoma, grade 2, estrogen receptor strongly positive, progesterone receptor 1% positive, with no HER-2 amplification and an MIB-1 of 1%.  (a) CT scan of the head and chest, without contrast 08/23/2018 showed nonspecific 0.4 cm left lower lobe pulmonary nodule, no definitive metastatic disease  (b) bone scan 08/23/2018 shows multiple spinal areas of abnormal uptake, but  (c) total spinal MRI 09/03/2018 finds bone scan findings to be secondary to degenerative disease, no evidence of metastatic disease.  (1) status post right mastectomy and sentinel lymph node sampling 09/16/2018 showing a pT3 pN1(mic), stage IIA-IIIA invasive lobular breast cancer, grade 2, with 1 of 5 sampled  lymph nodes involved by micrometastatic deposit, with extra nodular extension; ample margins  (2)  MammaPrint "high risk" suggests a 5-year metastasis free survival of 93% with chemotherapy, with an absolute chemotherapy benefit in the greater than 12% range  (3) adjuvant radiation to follow (can be done concurrently with chemotherapy)  (4) anastrozole started neoadjuvantly (on 08/14/2018), discontinued 10/14/2018 in preparation for chemotherapy  (5) Caris requested 10/14/2018  (6) adjuvant chemotherapy consisting of cyclophosphamide, methotrexate, fluorouracil (CMF) started 11/05/2018, to be repeated every 21 days x 8   PLAN: Doris Smith will proceed to her second cycle of CMF today.  She is tolerating treatment remarkably well and she is very encouraged, walking, and planning to join to exercise classes on TV.  This is very favorable.  I have sent a note to Dr. Lisbeth Renshaw to see if radiation oncology can see her on the day of her third cycle, 3 weeks from today, to start setting her up for her radiation treatments.  Once she has been scheduled of course I will take out the methotrexate portion of her CMF  I am very encouraged by how well she is doing.  She knows to call for any other issue that may develop before her next visit.   Morelia Cassells, Virgie Dad, MD  11/26/18 10:29 AM Medical Oncology and Hematology [facility] [address] [place], [place] [address] Tel. [phone]    Fax. [phone]    I, Wilburn Mylar, am acting as scribe for Dr. Virgie Dad. Jiyah Torpey.  I, Lurline Del MD, have reviewed the above documentation for accuracy and completeness, and I agree with the above.

## 2018-11-26 ENCOUNTER — Inpatient Hospital Stay: Payer: BLUE CROSS/BLUE SHIELD

## 2018-11-26 ENCOUNTER — Other Ambulatory Visit: Payer: Self-pay

## 2018-11-26 ENCOUNTER — Inpatient Hospital Stay (HOSPITAL_BASED_OUTPATIENT_CLINIC_OR_DEPARTMENT_OTHER): Payer: BLUE CROSS/BLUE SHIELD | Admitting: Oncology

## 2018-11-26 VITALS — BP 118/60 | HR 75 | Temp 98.3°F | Resp 18 | Ht 64.0 in | Wt 218.0 lb

## 2018-11-26 DIAGNOSIS — I4891 Unspecified atrial fibrillation: Secondary | ICD-10-CM

## 2018-11-26 DIAGNOSIS — C50411 Malignant neoplasm of upper-outer quadrant of right female breast: Secondary | ICD-10-CM

## 2018-11-26 DIAGNOSIS — Z170 Estrogen receptor positive status [ER+]: Secondary | ICD-10-CM

## 2018-11-26 DIAGNOSIS — Z79899 Other long term (current) drug therapy: Secondary | ICD-10-CM | POA: Diagnosis not present

## 2018-11-26 DIAGNOSIS — Z5111 Encounter for antineoplastic chemotherapy: Secondary | ICD-10-CM | POA: Diagnosis not present

## 2018-11-26 DIAGNOSIS — Z95828 Presence of other vascular implants and grafts: Secondary | ICD-10-CM

## 2018-11-26 DIAGNOSIS — R911 Solitary pulmonary nodule: Secondary | ICD-10-CM | POA: Diagnosis not present

## 2018-11-26 DIAGNOSIS — C50211 Malignant neoplasm of upper-inner quadrant of right female breast: Secondary | ICD-10-CM | POA: Diagnosis not present

## 2018-11-26 DIAGNOSIS — Z9011 Acquired absence of right breast and nipple: Secondary | ICD-10-CM

## 2018-11-26 DIAGNOSIS — R42 Dizziness and giddiness: Secondary | ICD-10-CM | POA: Diagnosis not present

## 2018-11-26 LAB — COMPREHENSIVE METABOLIC PANEL
ALT: 13 U/L (ref 0–44)
AST: 16 U/L (ref 15–41)
Albumin: 3.3 g/dL — ABNORMAL LOW (ref 3.5–5.0)
Alkaline Phosphatase: 106 U/L (ref 38–126)
Anion gap: 8 (ref 5–15)
BUN: 9 mg/dL (ref 8–23)
CO2: 28 mmol/L (ref 22–32)
Calcium: 8.3 mg/dL — ABNORMAL LOW (ref 8.9–10.3)
Chloride: 108 mmol/L (ref 98–111)
Creatinine, Ser: 1.01 mg/dL — ABNORMAL HIGH (ref 0.44–1.00)
GFR calc Af Amer: >60 mL/min (ref >60)
GFR calc non Af Amer: 58 mL/min — ABNORMAL LOW (ref >60)
Glucose, Bld: 93 mg/dL (ref 70–99)
Potassium: 4.1 mmol/L (ref 3.5–5.1)
Sodium: 144 mmol/L (ref 135–145)
Total Bilirubin: 0.2 mg/dL — ABNORMAL LOW (ref 0.3–1.2)
Total Protein: 6.4 g/dL — ABNORMAL LOW (ref 6.5–8.1)

## 2018-11-26 LAB — CBC WITH DIFFERENTIAL/PLATELET
Abs Immature Granulocytes: 0.31 10*3/uL — ABNORMAL HIGH (ref 0.00–0.07)
Basophils Absolute: 0.1 10*3/uL (ref 0.0–0.1)
Basophils Relative: 2 %
Eosinophils Absolute: 0.1 10*3/uL (ref 0.0–0.5)
Eosinophils Relative: 2 %
HCT: 34.2 % — ABNORMAL LOW (ref 36.0–46.0)
Hemoglobin: 10.9 g/dL — ABNORMAL LOW (ref 12.0–15.0)
Immature Granulocytes: 5 %
Lymphocytes Relative: 21 %
Lymphs Abs: 1.2 10*3/uL (ref 0.7–4.0)
MCH: 28.8 pg (ref 26.0–34.0)
MCHC: 31.9 g/dL (ref 30.0–36.0)
MCV: 90.2 fL (ref 80.0–100.0)
Monocytes Absolute: 1.1 10*3/uL — ABNORMAL HIGH (ref 0.1–1.0)
Monocytes Relative: 19 %
Neutro Abs: 3 10*3/uL (ref 1.7–7.7)
Neutrophils Relative %: 51 %
Platelets: 263 10*3/uL (ref 150–400)
RBC: 3.79 MIL/uL — ABNORMAL LOW (ref 3.87–5.11)
RDW: 13.8 % (ref 11.5–15.5)
WBC: 5.9 10*3/uL (ref 4.0–10.5)
nRBC: 0.5 % — ABNORMAL HIGH (ref 0.0–0.2)

## 2018-11-26 MED ORDER — DEXAMETHASONE SODIUM PHOSPHATE 10 MG/ML IJ SOLN
10.0000 mg | Freq: Once | INTRAMUSCULAR | Status: AC
  Administered 2018-11-26: 10 mg via INTRAVENOUS

## 2018-11-26 MED ORDER — SODIUM CHLORIDE 0.9 % IV SOLN
Freq: Once | INTRAVENOUS | Status: AC
  Administered 2018-11-26: 11:00:00 via INTRAVENOUS
  Filled 2018-11-26: qty 250

## 2018-11-26 MED ORDER — PALONOSETRON HCL INJECTION 0.25 MG/5ML
INTRAVENOUS | Status: AC
  Filled 2018-11-26: qty 5

## 2018-11-26 MED ORDER — SODIUM CHLORIDE 0.9 % IV SOLN
600.0000 mg/m2 | Freq: Once | INTRAVENOUS | Status: AC
  Administered 2018-11-26: 1260 mg via INTRAVENOUS
  Filled 2018-11-26: qty 63

## 2018-11-26 MED ORDER — SODIUM CHLORIDE 0.9% FLUSH
10.0000 mL | INTRAVENOUS | Status: DC | PRN
  Administered 2018-11-26: 14:00:00 10 mL
  Filled 2018-11-26: qty 10

## 2018-11-26 MED ORDER — HEPARIN SOD (PORK) LOCK FLUSH 100 UNIT/ML IV SOLN
500.0000 [IU] | Freq: Once | INTRAVENOUS | Status: AC | PRN
  Administered 2018-11-26: 500 [IU]
  Filled 2018-11-26: qty 5

## 2018-11-26 MED ORDER — PALONOSETRON HCL INJECTION 0.25 MG/5ML
0.2500 mg | Freq: Once | INTRAVENOUS | Status: AC
  Administered 2018-11-26: 0.25 mg via INTRAVENOUS

## 2018-11-26 MED ORDER — SODIUM CHLORIDE 0.9% FLUSH
10.0000 mL | INTRAVENOUS | Status: DC | PRN
  Administered 2018-11-26: 10 mL
  Filled 2018-11-26: qty 10

## 2018-11-26 MED ORDER — DEXAMETHASONE SODIUM PHOSPHATE 10 MG/ML IJ SOLN
INTRAMUSCULAR | Status: AC
  Filled 2018-11-26: qty 1

## 2018-11-26 MED ORDER — FLUOROURACIL CHEMO INJECTION 2.5 GM/50ML
600.0000 mg/m2 | Freq: Once | INTRAVENOUS | Status: AC
  Administered 2018-11-26: 1250 mg via INTRAVENOUS
  Filled 2018-11-26: qty 25

## 2018-11-26 MED ORDER — METHOTREXATE SODIUM (PF) CHEMO INJECTION 250 MG/10ML
39.8000 mg/m2 | Freq: Once | INTRAMUSCULAR | Status: AC
  Administered 2018-11-26: 84 mg via INTRAVENOUS
  Filled 2018-11-26: qty 3.36

## 2018-11-26 NOTE — Addendum Note (Signed)
Addended by: Chauncey Cruel on: 11/26/2018 10:44 AM   Modules accepted: Orders

## 2018-11-26 NOTE — Progress Notes (Signed)
At 1310 after cytoxan flush, pt c/o sinuses burnng. I explained that this can happen with cytoxan and that it is only temporary.

## 2018-11-26 NOTE — Patient Instructions (Signed)
[facility] Discharge Instructions for Patients Receiving Chemotherapy  Today you received the following chemotherapy agents Cytoxan, Methotrexate, 5 FU  To help prevent nausea and vomiting after your treatment, we encourage you to take your nausea medication as directed  If you develop nausea and vomiting that is not controlled by your nausea medication, call the clinic.   BELOW ARE SYMPTOMS THAT SHOULD BE REPORTED IMMEDIATELY:  *FEVER GREATER THAN 100.5 F  *CHILLS WITH OR WITHOUT FEVER  NAUSEA AND VOMITING THAT IS NOT CONTROLLED WITH YOUR NAUSEA MEDICATION  *UNUSUAL SHORTNESS OF BREATH  *UNUSUAL BRUISING OR BLEEDING  TENDERNESS IN MOUTH AND THROAT WITH OR WITHOUT PRESENCE OF ULCERS  *URINARY PROBLEMS  *BOWEL PROBLEMS  UNUSUAL RASH Items with * indicate a potential emergency and should be followed up as soon as possible.  Feel free to call the clinic should you have any questions or concerns. The clinic phone number is [phone].  Please show the [facility] at check-in to the Emergency Department and triage nurse.  Cyclophosphamide injection(cytoxan) What is this medicine? CYCLOPHOSPHAMIDE (sye kloe FOSS fa mide) is a chemotherapy drug. It slows the growth of cancer cells. This medicine is used to treat many types of cancer like lymphoma, myeloma, leukemia, breast cancer, and ovarian cancer, to name a few. This medicine may be used for other purposes; ask your health care provider or pharmacist if you have questions. COMMON BRAND NAME(S): Cytoxan, Neosar What should I tell my health care provider before I take this medicine? They need to know if you have any of these conditions: -blood disorders -history of other chemotherapy -infection -kidney disease -liver disease -recent or ongoing radiation therapy -tumors in the bone marrow -an unusual or allergic reaction to cyclophosphamide, other chemotherapy, other medicines, foods, dyes, or  preservatives -pregnant or trying to get pregnant -breast-feeding How should I use this medicine? This drug is usually given as an injection into a vein or muscle or by infusion into a vein. It is administered in a hospital or clinic by a specially trained health care professional. Talk to your pediatrician regarding the use of this medicine in children. Special care may be needed. Overdosage: If you think you have taken too much of this medicine contact a poison control center or emergency room at once. NOTE: This medicine is only for you. Do not share this medicine with others. What if I miss a dose? It is important not to miss your dose. Call your doctor or health care professional if you are unable to keep an appointment. What may interact with this medicine? This medicine may interact with the following medications: -amiodarone -amphotericin B -azathioprine -certain antiviral medicines for HIV or AIDS such as protease inhibitors (e.g., indinavir, ritonavir) and zidovudine -certain blood pressure medications such as benazepril, captopril, enalapril, fosinopril, lisinopril, moexipril, monopril, perindopril, quinapril, ramipril, trandolapril -certain cancer medications such as anthracyclines (e.g., daunorubicin, doxorubicin), busulfan, cytarabine, paclitaxel, pentostatin, tamoxifen, trastuzumab -certain diuretics such as chlorothiazide, chlorthalidone, hydrochlorothiazide, indapamide, metolazone -certain medicines that treat or prevent blood clots like warfarin -certain muscle relaxants such as succinylcholine -cyclosporine -etanercept -indomethacin -medicines to increase blood counts like filgrastim, pegfilgrastim, sargramostim -medicines used as general anesthesia -metronidazole -natalizumab This list may not describe all possible interactions. Give your health care provider a list of all the medicines, herbs, non-prescription drugs, or dietary supplements you use. Also tell them if  you smoke, drink alcohol, or use illegal drugs. Some items may interact with your medicine. What should I watch  for while using this medicine? Visit your doctor for checks on your progress. This drug may make you feel generally unwell. This is not uncommon, as chemotherapy can affect healthy cells as well as cancer cells. Report any side effects. Continue your course of treatment even though you feel ill unless your doctor tells you to stop. Drink water or other fluids as directed. Urinate often, even at night. In some cases, you may be given additional medicines to help with side effects. Follow all directions for their use. Call your doctor or health care professional for advice if you get a fever, chills or sore throat, or other symptoms of a cold or flu. Do not treat yourself. This drug decreases your body's ability to fight infections. Try to avoid being around people who are sick. This medicine may increase your risk to bruise or bleed. Call your doctor or health care professional if you notice any unusual bleeding. Be careful brushing and flossing your teeth or using a toothpick because you may get an infection or bleed more easily. If you have any dental work done, tell your dentist you are receiving this medicine. You may get drowsy or dizzy. Do not drive, use machinery, or do anything that needs mental alertness until you know how this medicine affects you. Do not become pregnant while taking this medicine or for 1 year after stopping it. Women should inform their doctor if they wish to become pregnant or think they might be pregnant. Men should not father a child while taking this medicine and for 4 months after stopping it. There is a potential for serious side effects to an unborn child. Talk to your health care professional or pharmacist for more information. Do not breast-feed an infant while taking this medicine. This medicine may interfere with the ability to have a child. This medicine  has caused ovarian failure in some women. This medicine has caused reduced sperm counts in some men. You should talk with your doctor or health care professional if you are concerned about your fertility. If you are going to have surgery, tell your doctor or health care professional that you have taken this medicine. What side effects may I notice from receiving this medicine? Side effects that you should report to your doctor or health care professional as soon as possible: -allergic reactions like skin rash, itching or hives, swelling of the face, lips, or tongue -low blood counts - this medicine may decrease the number of white blood cells, red blood cells and platelets. You may be at increased risk for infections and bleeding. -signs of infection - fever or chills, cough, sore throat, pain or difficulty passing urine -signs of decreased platelets or bleeding - bruising, pinpoint red spots on the skin, black, tarry stools, blood in the urine -signs of decreased red blood cells - unusually weak or tired, fainting spells, lightheadedness -breathing problems -dark urine -dizziness -palpitations -swelling of the ankles, feet, hands -trouble passing urine or change in the amount of urine -weight gain -yellowing of the eyes or skin Side effects that usually do not require medical attention (report to your doctor or health care professional if they continue or are bothersome): -changes in nail or skin color -hair loss -missed menstrual periods -mouth sores -nausea, vomiting This list may not describe all possible side effects. Call your doctor for medical advice about side effects. You may report side effects to FDA at 1-800-FDA-1088. Where should I keep my medicine? This drug is given in a hospital  or clinic and will not be stored at home. NOTE: This sheet is a summary. It may not cover all possible information. If you have questions about this medicine, talk to your doctor, pharmacist, or  health care provider.  2019 Elsevier/[employer] Standard (2012-05-10 16:22:58)  Methotrexate injection What is this medicine? METHOTREXATE (METH oh TREX ate) is a chemotherapy drug used to treat cancer including breast cancer, leukemia, and lymphoma. This medicine can also be used to treat psoriasis and certain kinds of arthritis. This medicine may be used for other purposes; ask your health care provider or pharmacist if you have questions. What should I tell my health care provider before I take this medicine? They need to know if you have any of these conditions: -fluid in the stomach area or lungs -if you often drink alcohol -infection or immune system problems -kidney disease -liver disease -low blood counts, like low white cell, platelet, or red cell counts -lung disease -radiation therapy -stomach ulcers -ulcerative colitis -an unusual or allergic reaction to methotrexate, other medicines, foods, dyes, or preservatives -pregnant or trying to get pregnant -breast-feeding How should I use this medicine? This medicine is for infusion into a vein or for injection into muscle or into the spinal fluid (whichever applies). It is usually given by a health care professional in a hospital or clinic setting. In rare cases, you might get this medicine at home. You will be taught how to give this medicine. Use exactly as directed. Take your medicine at regular intervals. Do not take your medicine more often than directed. If this medicine is used for arthritis or psoriasis, it should be taken weekly, NOT daily. It is important that you put your used needles and syringes in a special sharps container. Do not put them in a trash can. If you do not have a sharps container, call your pharmacist or healthcare provider to get one. Talk to your pediatrician regarding the use of this medicine in children. While this drug may be prescribed for children as young as 2 years for selected conditions, precautions  do apply. Overdosage: If you think you have taken too much of this medicine contact a poison control center or emergency room at once. NOTE: This medicine is only for you. Do not share this medicine with others. What if I miss a dose? It is important not to miss your dose. Call your doctor or health care professional if you are unable to keep an appointment. If you give yourself the medicine and you miss a dose, talk with your doctor or health care professional. Do not take double or extra doses. What may interact with this medicine? This medicine may interact with the following medications: -acitretin -aspirin or aspirin-like medicines including salicylates -azathioprine -certain antibiotics like chloramphenicol, penicillin, tetracycline -certain medicines for stomach problems like esomeprazole, omeprazole, pantoprazole -cyclosporine -gold -hydroxychloroquine -live virus vaccines -mercaptopurine -NSAIDs, medicines for pain and inflammation, like ibuprofen or naproxen -other cytotoxic agents -penicillamine -phenylbutazone -phenytoin -probenacid -retinoids such as isotretinoin and tretinoin -steroid medicines like prednisone or cortisone -sulfonamides like sulfasalazine and trimethoprim/sulfamethoxazole -theophylline This list may not describe all possible interactions. Give your health care provider a list of all the medicines, herbs, non-prescription drugs, or dietary supplements you use. Also tell them if you smoke, drink alcohol, or use illegal drugs. Some items may interact with your medicine. What should I watch for while using this medicine? Avoid alcoholic drinks. In some cases, you may be given additional medicines to help with side effects. Follow  all directions for their use. This medicine can make you more sensitive to the sun. Keep out of the sun. If you cannot avoid being in the sun, wear protective clothing and use sunscreen. Do not use sun lamps or tanning  beds/booths. You may get drowsy or dizzy. Do not drive, use machinery, or do anything that needs mental alertness until you know how this medicine affects you. Do not stand or sit up quickly, especially if you are an older patient. This reduces the risk of dizzy or fainting spells. You may need blood work done while you are taking this medicine. Call your doctor or health care professional for advice if you get a fever, chills or sore throat, or other symptoms of a cold or flu. Do not treat yourself. This drug decreases your body's ability to fight infections. Try to avoid being around people who are sick. This medicine may increase your risk to bruise or bleed. Call your doctor or health care professional if you notice any unusual bleeding. Check with your doctor or health care professional if you get an attack of severe diarrhea, nausea and vomiting, or if you sweat a lot. The loss of too much body fluid can make it dangerous for you to take this medicine. Talk to your doctor about your risk of cancer. You may be more at risk for certain types of cancers if you take this medicine. Both men and women must use effective birth control with this medicine. Do not become pregnant while taking this medicine or until at least 1 normal menstrual cycle has occurred after stopping it. Women should inform their doctor if they wish to become pregnant or think they might be pregnant. Men should not father a child while taking this medicine and for 3 months after stopping it. There is a potential for serious side effects to an unborn child. Talk to your health care professional or pharmacist for more information. Do not breast-feed an infant while taking this medicine. What side effects may I notice from receiving this medicine? Side effects that you should report to your doctor or health care professional as soon as possible: -allergic reactions like skin rash, itching or hives, swelling of the face, lips, or  tongue -back pain -breathing problems or shortness of breath -confusion -diarrhea -dry, nonproductive cough -low blood counts - this medicine may decrease the number of white blood cells, red blood cells and platelets. You may be at increased risk of infections and bleeding -mouth sores -redness, blistering, peeling or loosening of the skin, including inside the mouth -seizures -severe headaches -signs of infection - fever or chills, cough, sore throat, pain or difficulty passing urine -signs and symptoms of bleeding such as bloody or black, tarry stools; red or dark-brown urine; spitting up blood or brown material that looks like coffee grounds; red spots on the skin; unusual bruising or bleeding from the eye, gums, or nose -signs and symptoms of kidney injury like trouble passing urine or change in the amount of urine -signs and symptoms of liver injury like dark yellow or brown urine; general ill feeling or flu-like symptoms; light-colored stools; loss of appetite; nausea; right upper belly pain; unusually weak or tired; yellowing of the eyes or skin -stiff neck -vomiting Side effects that usually do not require medical attention (report to your doctor or health care professional if they continue or are bothersome): -dizziness -hair loss -headache -stomach pain -upset stomach This list may not describe all possible side effects. Call your  doctor for medical advice about side effects. You may report side effects to FDA at 1-800-FDA-1088. Where should I keep my medicine? If you are using this medicine at home, you will be instructed on how to store this medicine. Throw away any unused medicine after the expiration date on the label. NOTE: This sheet is a summary. It may not cover all possible information. If you have questions about this medicine, talk to your doctor, pharmacist, or health care provider.  2019 [employer]/Gold Standard (2017-02-15 13:31:42)  Fluorouracil, 5-FU  injection What is this medicine? FLUOROURACIL, 5-FU (flure oh YOOR a sil) is a chemotherapy drug. It slows the growth of cancer cells. This medicine is used to treat many types of cancer like breast cancer, colon or rectal cancer, pancreatic cancer, and stomach cancer. This medicine may be used for other purposes; ask your health care provider or pharmacist if you have questions. COMMON BRAND NAME(S): Adrucil What should I tell my health care provider before I take this medicine? They need to know if you have any of these conditions: -blood disorders -dihydropyrimidine dehydrogenase (DPD) deficiency -infection (especially a virus infection such as chickenpox, cold sores, or herpes) -kidney disease -liver disease -malnourished, poor nutrition -recent or ongoing radiation therapy -an unusual or allergic reaction to fluorouracil, other chemotherapy, other medicines, foods, dyes, or preservatives -pregnant or trying to get pregnant -breast-feeding How should I use this medicine? This drug is given as an infusion or injection into a vein. It is administered in a hospital or clinic by a specially trained health care professional. Talk to your pediatrician regarding the use of this medicine in children. Special care may be needed. Overdosage: If you think you have taken too much of this medicine contact a poison control center or emergency room at once. NOTE: This medicine is only for you. Do not share this medicine with others. What if I miss a dose? It is important not to miss your dose. Call your doctor or health care professional if you are unable to keep an appointment. What may interact with this medicine? -allopurinol -cimetidine -dapsone -digoxin -hydroxyurea -leucovorin -levamisole -medicines for seizures like ethotoin, fosphenytoin, phenytoin -medicines to increase blood counts like filgrastim, pegfilgrastim, sargramostim -medicines that treat or prevent blood clots like  warfarin, enoxaparin, and dalteparin -methotrexate -metronidazole -pyrimethamine -some other chemotherapy drugs like busulfan, cisplatin, estramustine, vinblastine -trimethoprim -trimetrexate -vaccines Talk to your doctor or health care professional before taking any of these medicines: -acetaminophen -aspirin -ibuprofen -ketoprofen -naproxen This list may not describe all possible interactions. Give your health care provider a list of all the medicines, herbs, non-prescription drugs, or dietary supplements you use. Also tell them if you smoke, drink alcohol, or use illegal drugs. Some items may interact with your medicine. What should I watch for while using this medicine? Visit your doctor for checks on your progress. This drug may make you feel generally unwell. This is not uncommon, as chemotherapy can affect healthy cells as well as cancer cells. Report any side effects. Continue your course of treatment even though you feel ill unless your doctor tells you to stop. In some cases, you may be given additional medicines to help with side effects. Follow all directions for their use. Call your doctor or health care professional for advice if you get a fever, chills or sore throat, or other symptoms of a cold or flu. Do not treat yourself. This drug decreases your body's ability to fight infections. Try to avoid being around people who are  sick. This medicine may increase your risk to bruise or bleed. Call your doctor or health care professional if you notice any unusual bleeding. Be careful brushing and flossing your teeth or using a toothpick because you may get an infection or bleed more easily. If you have any dental work done, tell your dentist you are receiving this medicine. Avoid taking products that contain aspirin, acetaminophen, ibuprofen, naproxen, or ketoprofen unless instructed by your doctor. These medicines may hide a fever. Do not become pregnant while taking this medicine.  Women should inform their doctor if they wish to become pregnant or think they might be pregnant. There is a potential for serious side effects to an unborn child. Talk to your health care professional or pharmacist for more information. Do not breast-feed an infant while taking this medicine. Men should inform their doctor if they wish to father a child. This medicine may lower sperm counts. Do not treat diarrhea with over the counter products. Contact your doctor if you have diarrhea that lasts more than 2 days or if it is severe and watery. This medicine can make you more sensitive to the sun. Keep out of the sun. If you cannot avoid being in the sun, wear protective clothing and use sunscreen. Do not use sun lamps or tanning beds/booths. What side effects may I notice from receiving this medicine? Side effects that you should report to your doctor or health care professional as soon as possible: -allergic reactions like skin rash, itching or hives, swelling of the face, lips, or tongue -low blood counts - this medicine may decrease the number of white blood cells, red blood cells and platelets. You may be at increased risk for infections and bleeding. -signs of infection - fever or chills, cough, sore throat, pain or difficulty passing urine -signs of decreased platelets or bleeding - bruising, pinpoint red spots on the skin, black, tarry stools, blood in the urine -signs of decreased red blood cells - unusually weak or tired, fainting spells, lightheadedness -breathing problems -changes in vision -chest pain -mouth sores -nausea and vomiting -pain, swelling, redness at site where injected -pain, tingling, numbness in the hands or feet -redness, swelling, or sores on hands or feet -stomach pain -unusual bleeding Side effects that usually do not require medical attention (report to your doctor or health care professional if they continue or are bothersome): -changes in finger or toe  nails -diarrhea -dry or itchy skin -hair loss -headache -loss of appetite -sensitivity of eyes to the light -stomach upset -unusually teary eyes This list may not describe all possible side effects. Call your doctor for medical advice about side effects. You may report side effects to FDA at 1-800-FDA-1088. Where should I keep my medicine? This drug is given in a hospital or clinic and will not be stored at home. NOTE: This sheet is a summary. It may not cover all possible information. If you have questions about this medicine, talk to your doctor, pharmacist, or health care provider.  2019 [employer]/Gold Standard (2007-10-30 13:53:16)  Nausea, Adult Nausea is feeling sick to your stomach or feeling that you are about to throw up (vomit). Feeling sick to your stomach is usually not serious, but it may be an early sign of a more serious medical problem. As you feel sicker to your stomach, you may throw up. If you throw up, or if you are not able to drink enough fluids, there is a risk that you may lose too much water in your  body (get dehydrated). If you lose too much water in your body, you may:  Feel tired.  Feel thirsty.  Have a dry mouth.  Have cracked lips.  Go pee (urinate) less often. Older adults and people who have other diseases or a weak body defense system (immune system) have a higher risk of losing too much water in the body. The main goals of treating this condition are:  To relieve your nausea.  To ensure your nausea occurs less often.  To prevent throwing up and losing too much fluid. Follow these instructions at home: Watch your symptoms for any changes. Tell your doctor about them. Follow these instructions as told by your doctor. Eating and drinking      Take an ORS (oral rehydration solution). This is a drink that is sold at pharmacies and stores.  Drink clear fluids in small amounts as you are able. These include: ? Water. ? Ice chips. ? Fruit  juice that has water added (diluted fruit juice). ? Low-calorie sports drinks.  Eat bland, easy-to-digest foods in small amounts as you are able, such as: ? Bananas. ? Applesauce. ? Rice. ? Low-fat (lean) meats. ? Toast. ? Crackers.  Avoid drinking fluids that have a lot of sugar or caffeine in them. This includes energy drinks, sports drinks, and soda.  Avoid alcohol.  Avoid spicy or fatty foods. General instructions  Take over-the-counter and prescription medicines only as told by your doctor.  Rest at home while you get better.  Drink enough fluid to keep your pee (urine) pale yellow.  Take slow and deep breaths when you feel sick to your stomach.  Avoid food or things that have strong smells.  Wash your hands often with soap and water. If you cannot use soap and water, use hand sanitizer.  Make sure that all people in your home wash their hands well and often.  Keep all follow-up visits as told by your doctor. This is important. Contact a doctor if:  You feel sicker to your stomach.  You feel sick to your stomach for more than 2 days.  You throw up.  You are not able to drink fluids without throwing up.  You have new symptoms.  You have a fever.  You have a headache.  You have muscle cramps.  You have a rash.  You have pain while peeing.  You feel light-headed or dizzy. Get help right away if:  You have pain in your chest, neck, arm, or jaw.  You feel very weak or you pass out (faint).  You have throw up that is bright red or looks like coffee grounds.  You have bloody or black poop (stools) or poop that looks like tar.  You have a very bad headache, a stiff neck, or both.  You have very bad pain, cramping, or bloating in your belly (abdomen).  You have trouble breathing or you are breathing very quickly.  Your heart is beating very quickly.  Your skin feels cold and clammy.  You feel confused.  You have signs of losing too much water  in your body, such as: ? Dark pee, very little pee, or no pee. ? Cracked lips. ? Dry mouth. ? Sunken eyes. ? Sleepiness. ? Weakness. These symptoms may be an emergency. Do not wait to see if the symptoms will go away. Get medical help right away. Call your local emergency services (911 in the [place] Do not drive yourself to the hospital. Summary  Nausea is feeling  sick to your stomach or feeling that you are about to throw up (vomit).  If you throw up, or if you are not able to drink enough fluids, there is a risk that you may lose too much water in your body (get dehydrated).  Eat and drink what your doctor tells you. Take over-the-counter and prescription medicines only as told by your doctor.  Contact a doctor right away if your symptoms get worse or you have new symptoms.  Keep all follow-up visits as told by your doctor. This is important. This information is not intended to replace advice given to you by your health care provider. Make sure you discuss any questions you have with your health care provider. Document Released: 06/15/2011 Document Revised: 12/04/2017 Document Reviewed: 12/04/2017 Elsevier Interactive Patient Education  2019 [employer].

## 2018-11-27 ENCOUNTER — Other Ambulatory Visit: Payer: Self-pay | Admitting: *Deleted

## 2018-11-27 DIAGNOSIS — C50411 Malignant neoplasm of upper-outer quadrant of right female breast: Secondary | ICD-10-CM

## 2018-11-27 DIAGNOSIS — Z170 Estrogen receptor positive status [ER+]: Secondary | ICD-10-CM

## 2018-11-28 ENCOUNTER — Telehealth: Payer: Self-pay | Admitting: Oncology

## 2018-11-28 NOTE — Telephone Encounter (Signed)
Tried to call no voicemail has been set up

## 2018-12-09 ENCOUNTER — Other Ambulatory Visit: Payer: Self-pay | Admitting: Gastroenterology

## 2018-12-09 DIAGNOSIS — C50411 Malignant neoplasm of upper-outer quadrant of right female breast: Secondary | ICD-10-CM | POA: Diagnosis not present

## 2018-12-10 ENCOUNTER — Telehealth: Payer: Self-pay | Admitting: *Deleted

## 2018-12-10 DIAGNOSIS — F312 Bipolar disorder, current episode manic severe with psychotic features: Secondary | ICD-10-CM | POA: Diagnosis not present

## 2018-12-10 DIAGNOSIS — F4310 Post-traumatic stress disorder, unspecified: Secondary | ICD-10-CM | POA: Diagnosis not present

## 2018-12-10 DIAGNOSIS — F410 Panic disorder [episodic paroxysmal anxiety] without agoraphobia: Secondary | ICD-10-CM | POA: Diagnosis not present

## 2018-12-10 NOTE — Telephone Encounter (Signed)
See other note

## 2018-12-10 NOTE — Telephone Encounter (Signed)
This RN returned call to pt per her VM stating she is " having issues she wants the MD to know about ".  Per phone discussion Doris Smith stated  " this 2nd chemo treatment has knocked me for a loop " " terrible nausea not relieved with the medication " " my poop is different - not diarrhea - just more soft and sometimes I am afraid I won't make it to the bathroom in time " " right after my chemo for several days I am having to wear a depends to bed because I am going more at night and don't get to the bathroom in time " " no energy which I have seem to have had but even worse now that I am having a hard time getting and going " " I am dizzy " " I am forgetting things more and more"  Doris Smith states she " has never really felt good but I mean now I feel terrible and am tired and just don't like this "  This RN validated pt's symptoms and feelings.  Chemo therapy side effects discussed as well as how it affects her relating to her health history including her IBS.  This RN discussed possible " tweaking " of her therapy for better control which may include having her come in for IVF for a day or 2 after treatment.  This RN again reiterated goal of therapy is cure- as well as validated that the therapy is not easy but that we may be able to change a few things to give some improvement.  Doris Smith stated appreciation of call and discussion.  Per ending of conversation, Doris Smith wanted to state " I got 2 positive things happening - my 4th grand daughter will be here at 9 am in the morning- because they are having to take her ( cesarean birth ) and my oldest grandson has been entered in a baby contest and he in top running for the state and 10th in the country !"  This note will be forwarded to MD and NP ( per pending visit next week ) for review and further recommendations.

## 2018-12-16 ENCOUNTER — Encounter (HOSPITAL_COMMUNITY): Payer: Self-pay | Admitting: Oncology

## 2018-12-17 ENCOUNTER — Other Ambulatory Visit: Payer: Self-pay

## 2018-12-17 ENCOUNTER — Inpatient Hospital Stay: Payer: BC Managed Care – PPO

## 2018-12-17 ENCOUNTER — Encounter: Payer: Self-pay | Admitting: Adult Health

## 2018-12-17 ENCOUNTER — Inpatient Hospital Stay: Payer: BC Managed Care – PPO | Attending: Oncology

## 2018-12-17 ENCOUNTER — Inpatient Hospital Stay (HOSPITAL_BASED_OUTPATIENT_CLINIC_OR_DEPARTMENT_OTHER): Payer: BC Managed Care – PPO | Admitting: Adult Health

## 2018-12-17 VITALS — BP 106/39 | HR 71 | Temp 98.0°F | Resp 17 | Ht 64.0 in | Wt 217.7 lb

## 2018-12-17 DIAGNOSIS — R031 Nonspecific low blood-pressure reading: Secondary | ICD-10-CM | POA: Insufficient documentation

## 2018-12-17 DIAGNOSIS — C50411 Malignant neoplasm of upper-outer quadrant of right female breast: Secondary | ICD-10-CM

## 2018-12-17 DIAGNOSIS — Z170 Estrogen receptor positive status [ER+]: Secondary | ICD-10-CM | POA: Insufficient documentation

## 2018-12-17 DIAGNOSIS — D701 Agranulocytosis secondary to cancer chemotherapy: Secondary | ICD-10-CM

## 2018-12-17 DIAGNOSIS — C50211 Malignant neoplasm of upper-inner quadrant of right female breast: Secondary | ICD-10-CM | POA: Insufficient documentation

## 2018-12-17 DIAGNOSIS — R05 Cough: Secondary | ICD-10-CM | POA: Diagnosis not present

## 2018-12-17 DIAGNOSIS — Z5189 Encounter for other specified aftercare: Secondary | ICD-10-CM | POA: Insufficient documentation

## 2018-12-17 DIAGNOSIS — F419 Anxiety disorder, unspecified: Secondary | ICD-10-CM | POA: Diagnosis not present

## 2018-12-17 DIAGNOSIS — R110 Nausea: Secondary | ICD-10-CM | POA: Diagnosis not present

## 2018-12-17 DIAGNOSIS — R530 Neoplastic (malignant) related fatigue: Secondary | ICD-10-CM | POA: Insufficient documentation

## 2018-12-17 DIAGNOSIS — J449 Chronic obstructive pulmonary disease, unspecified: Secondary | ICD-10-CM | POA: Diagnosis not present

## 2018-12-17 DIAGNOSIS — Z5111 Encounter for antineoplastic chemotherapy: Secondary | ICD-10-CM | POA: Diagnosis not present

## 2018-12-17 DIAGNOSIS — Z9011 Acquired absence of right breast and nipple: Secondary | ICD-10-CM | POA: Insufficient documentation

## 2018-12-17 DIAGNOSIS — Z95828 Presence of other vascular implants and grafts: Secondary | ICD-10-CM

## 2018-12-17 LAB — COMPREHENSIVE METABOLIC PANEL
ALT: 12 U/L (ref 0–44)
AST: 18 U/L (ref 15–41)
Albumin: 3.2 g/dL — ABNORMAL LOW (ref 3.5–5.0)
Alkaline Phosphatase: 110 U/L (ref 38–126)
Anion gap: 8 (ref 5–15)
BUN: 7 mg/dL — ABNORMAL LOW (ref 8–23)
CO2: 26 mmol/L (ref 22–32)
Calcium: 8.4 mg/dL — ABNORMAL LOW (ref 8.9–10.3)
Chloride: 111 mmol/L (ref 98–111)
Creatinine, Ser: 0.9 mg/dL (ref 0.44–1.00)
GFR calc Af Amer: >60 mL/min (ref >60)
GFR calc non Af Amer: >60 mL/min (ref >60)
Glucose, Bld: 128 mg/dL — ABNORMAL HIGH (ref 70–99)
Potassium: 3.9 mmol/L (ref 3.5–5.1)
Sodium: 145 mmol/L (ref 135–145)
Total Bilirubin: 0.3 mg/dL (ref 0.3–1.2)
Total Protein: 6.3 g/dL — ABNORMAL LOW (ref 6.5–8.1)

## 2018-12-17 LAB — CBC WITH DIFFERENTIAL/PLATELET
Abs Immature Granulocytes: 0.31 10*3/uL — ABNORMAL HIGH (ref 0.00–0.07)
Basophils Absolute: 0.1 10*3/uL (ref 0.0–0.1)
Basophils Relative: 2 %
Eosinophils Absolute: 0.1 10*3/uL (ref 0.0–0.5)
Eosinophils Relative: 3 %
HCT: 35.3 % — ABNORMAL LOW (ref 36.0–46.0)
Hemoglobin: 10.9 g/dL — ABNORMAL LOW (ref 12.0–15.0)
Immature Granulocytes: 9 %
Lymphocytes Relative: 21 %
Lymphs Abs: 0.8 10*3/uL (ref 0.7–4.0)
MCH: 28.3 pg (ref 26.0–34.0)
MCHC: 30.9 g/dL (ref 30.0–36.0)
MCV: 91.7 fL (ref 80.0–100.0)
Monocytes Absolute: 1.2 10*3/uL — ABNORMAL HIGH (ref 0.1–1.0)
Monocytes Relative: 33 %
Neutro Abs: 1.1 10*3/uL — ABNORMAL LOW (ref 1.7–7.7)
Neutrophils Relative %: 32 %
Platelets: 342 10*3/uL (ref 150–400)
RBC: 3.85 MIL/uL — ABNORMAL LOW (ref 3.87–5.11)
RDW: 15.1 % (ref 11.5–15.5)
WBC: 3.5 10*3/uL — ABNORMAL LOW (ref 4.0–10.5)
nRBC: 0.6 % — ABNORMAL HIGH (ref 0.0–0.2)

## 2018-12-17 MED ORDER — METHOTREXATE SODIUM (PF) CHEMO INJECTION 250 MG/10ML
39.8000 mg/m2 | Freq: Once | INTRAMUSCULAR | Status: AC
  Administered 2018-12-17: 84 mg via INTRAVENOUS
  Filled 2018-12-17: qty 3.36

## 2018-12-17 MED ORDER — LORAZEPAM 1 MG PO TABS
1.0000 mg | ORAL_TABLET | Freq: Once | ORAL | Status: AC
  Administered 2018-12-17: 1 mg via ORAL

## 2018-12-17 MED ORDER — SODIUM CHLORIDE 0.9 % IV SOLN
Freq: Once | INTRAVENOUS | Status: AC
  Administered 2018-12-17: 11:00:00 via INTRAVENOUS
  Filled 2018-12-17: qty 250

## 2018-12-17 MED ORDER — HEPARIN SOD (PORK) LOCK FLUSH 100 UNIT/ML IV SOLN
500.0000 [IU] | Freq: Once | INTRAVENOUS | Status: AC | PRN
  Administered 2018-12-17: 500 [IU]
  Filled 2018-12-17: qty 5

## 2018-12-17 MED ORDER — SODIUM CHLORIDE 0.9% FLUSH
10.0000 mL | INTRAVENOUS | Status: DC | PRN
  Administered 2018-12-17: 10 mL
  Filled 2018-12-17: qty 10

## 2018-12-17 MED ORDER — SODIUM CHLORIDE 0.9 % IV SOLN
600.0000 mg/m2 | Freq: Once | INTRAVENOUS | Status: AC
  Administered 2018-12-17: 1260 mg via INTRAVENOUS
  Filled 2018-12-17: qty 63

## 2018-12-17 MED ORDER — FLUOROURACIL CHEMO INJECTION 2.5 GM/50ML
600.0000 mg/m2 | Freq: Once | INTRAVENOUS | Status: AC
  Administered 2018-12-17: 1250 mg via INTRAVENOUS
  Filled 2018-12-17: qty 25

## 2018-12-17 MED ORDER — DEXAMETHASONE SODIUM PHOSPHATE 10 MG/ML IJ SOLN
INTRAMUSCULAR | Status: AC
  Filled 2018-12-17: qty 1

## 2018-12-17 MED ORDER — DEXAMETHASONE SODIUM PHOSPHATE 10 MG/ML IJ SOLN
10.0000 mg | Freq: Once | INTRAMUSCULAR | Status: AC
  Administered 2018-12-17: 10 mg via INTRAVENOUS

## 2018-12-17 MED ORDER — PALONOSETRON HCL INJECTION 0.25 MG/5ML
INTRAVENOUS | Status: AC
  Filled 2018-12-17: qty 5

## 2018-12-17 MED ORDER — LORAZEPAM 1 MG PO TABS
ORAL_TABLET | ORAL | Status: AC
  Filled 2018-12-17: qty 1

## 2018-12-17 MED ORDER — PALONOSETRON HCL INJECTION 0.25 MG/5ML
0.2500 mg | Freq: Once | INTRAVENOUS | Status: AC
  Administered 2018-12-17: 0.25 mg via INTRAVENOUS

## 2018-12-17 NOTE — Patient Instructions (Signed)
Pegfilgrastim injection  What is this medicine?  PEGFILGRASTIM (PEG fil gra stim) is a long-acting granulocyte colony-stimulating factor that stimulates the growth of neutrophils, a type of white blood cell important in the body's fight against infection. It is used to reduce the incidence of fever and infection in patients with certain types of cancer who are receiving chemotherapy that affects the bone marrow, and to increase survival after being exposed to high doses of radiation.  This medicine may be used for other purposes; ask your health care provider or pharmacist if you have questions.  COMMON BRAND NAME(S): Fulphila, Neulasta, UDENYCA  What should I tell my health care provider before I take this medicine?  They need to know if you have any of these conditions:  -kidney disease  -latex allergy  -ongoing radiation therapy  -sickle cell disease  -skin reactions to acrylic adhesives (On-Body Injector only)  -an unusual or allergic reaction to pegfilgrastim, filgrastim, other medicines, foods, dyes, or preservatives  -pregnant or trying to get pregnant  -breast-feeding  How should I use this medicine?  This medicine is for injection under the skin. If you get this medicine at home, you will be taught how to prepare and give the pre-filled syringe or how to use the On-body Injector. Refer to the patient Instructions for Use for detailed instructions. Use exactly as directed. Tell your healthcare provider immediately if you suspect that the On-body Injector may not have performed as intended or if you suspect the use of the On-body Injector resulted in a missed or partial dose.  It is important that you put your used needles and syringes in a special sharps container. Do not put them in a trash can. If you do not have a sharps container, call your pharmacist or healthcare provider to get one.  Talk to your pediatrician regarding the use of this medicine in children. While this drug may be prescribed for  selected conditions, precautions do apply.  Overdosage: If you think you have taken too much of this medicine contact a poison control center or emergency room at once.  NOTE: This medicine is only for you. Do not share this medicine with others.  What if I miss a dose?  It is important not to miss your dose. Call your doctor or health care professional if you miss your dose. If you miss a dose due to an On-body Injector failure or leakage, a new dose should be administered as soon as possible using a single prefilled syringe for manual use.  What may interact with this medicine?  Interactions have not been studied.  Give your health care provider a list of all the medicines, herbs, non-prescription drugs, or dietary supplements you use. Also tell them if you smoke, drink alcohol, or use illegal drugs. Some items may interact with your medicine.  This list may not describe all possible interactions. Give your health care provider a list of all the medicines, herbs, non-prescription drugs, or dietary supplements you use. Also tell them if you smoke, drink alcohol, or use illegal drugs. Some items may interact with your medicine.  What should I watch for while using this medicine?  You may need blood work done while you are taking this medicine.  If you are going to need a MRI, CT scan, or other procedure, tell your doctor that you are using this medicine (On-Body Injector only).  What side effects may I notice from receiving this medicine?  Side effects that you should report to   your doctor or health care professional as soon as possible:  -allergic reactions like skin rash, itching or hives, swelling of the face, lips, or tongue  -back pain  -dizziness  -fever  -pain, redness, or irritation at site where injected  -pinpoint red spots on the skin  -red or dark-brown urine  -shortness of breath or breathing problems  -stomach or side pain, or pain at the shoulder  -swelling  -tiredness  -trouble passing urine or  change in the amount of urine  Side effects that usually do not require medical attention (report to your doctor or health care professional if they continue or are bothersome):  -bone pain  -muscle pain  This list may not describe all possible side effects. Call your doctor for medical advice about side effects. You may report side effects to FDA at 1-800-FDA-1088.  Where should I keep my medicine?  Keep out of the reach of children.  If you are using this medicine at home, you will be instructed on how to store it. Throw away any unused medicine after the expiration date on the label.  NOTE: This sheet is a summary. It may not cover all possible information. If you have questions about this medicine, talk to your doctor, pharmacist, or health care provider.   2019 Elsevier/Gold Standard (2017-10-01 16:57:08)

## 2018-12-17 NOTE — Patient Instructions (Addendum)
[facility] Discharge Instructions for Patients Receiving Chemotherapy  Today you received the following chemotherapy agents: Cytoxan, Methotrexate, 5FU  To help prevent nausea and vomiting after your treatment, we encourage you to take your nausea medication as directed.    If you develop nausea and vomiting that is not controlled by your nausea medication, call the clinic.   BELOW ARE SYMPTOMS THAT SHOULD BE REPORTED IMMEDIATELY:  *FEVER GREATER THAN 100.5 F  *CHILLS WITH OR WITHOUT FEVER  NAUSEA AND VOMITING THAT IS NOT CONTROLLED WITH YOUR NAUSEA MEDICATION  *UNUSUAL SHORTNESS OF BREATH  *UNUSUAL BRUISING OR BLEEDING  TENDERNESS IN MOUTH AND THROAT WITH OR WITHOUT PRESENCE OF ULCERS  *URINARY PROBLEMS  *BOWEL PROBLEMS  UNUSUAL RASH Items with * indicate a potential emergency and should be followed up as soon as possible.  Feel free to call the clinic should you have any questions or concerns. The clinic phone number is [phone].  Please show the CHEMO ALERT CARD at check-in to the Emergency Department and triage nurse.   

## 2018-12-17 NOTE — Progress Notes (Signed)
1220 pt  C/o of nasal burning with cytoxan infusion. Rate decreased. Pt also c/o anxiety. Discussed with Sandi Mealy, PA-C. Order received for oral ativan.  1300 Pt states anxiety is less after ativan.

## 2018-12-17 NOTE — Progress Notes (Signed)
Ok to treat with [facility] today, new orders for Neulasta to be put in by [place] NP

## 2018-12-17 NOTE — Progress Notes (Signed)
[place]  [phone] [phone]) [phone]     ID: Doris Smith DOB: 1951-02-22  MR#: [id number]  EXN#:[id number]  Patient Care Team: Chevis Pretty, FNP as PCP - General (Nurse Practitioner) Alphonsa Overall, MD as Consulting Physician (General Surgery) Magrinat, Virgie Dad, MD as Consulting Physician (Oncology) Kyung Rudd, MD as Consulting Physician (Radiation Oncology) Elijio Miles, MD as Consulting Physician (Pulmonary Disease) Netta Cedars, MD as Consulting Physician (Orthopedic Surgery) Susa Day, MD as Consulting Physician (Orthopedic Surgery) Janeth Rase, NP as Nurse Practitioner (Adult Health Nurse Practitioner) Mauro Kaufmann, RN as Oncology Nurse Navigator Rockwell Germany, RN as Oncology Nurse Navigator Irene Limbo, MD as Consulting Physician (Plastic Surgery) Scot Dock, NP OTHER MD: Chapman Moss, psychiatry   CHIEF COMPLAINT: Estrogen receptor positive breast cancer  CURRENT TREATMENT: Adjuvant chemotherapy   INTERVAL HISTORY: Doris Smith is seen today for follow-up and treatment of her estrogen receptor positive breast cancer.  She continues on cyclophosphamide, methotrexate, fluorouracil (CMF), to be repeated every 21 days x 8. Today is day 1 cycle 3.   REVIEW OF SYSTEMS: Doris Smith is doing moderately well today.  She notes she has some pain at her port site where her port was accessed this morning.  She is celebrating her 49th wedding anniversary today.    Doris Smith is tearful today.  She notes that she feels emotional when she comes in here to see [place].  She notes that she was "knocked for a loop" after receiving her third cycle of treatment.  She says that she had increasing fatigue, increased nausea and dizziness, and "anything else you want to think of I was."  She says her anti emetics did not help.  She is tearful and notes that she doesn't like being here.  She says that the most important thing to her  was her breasts and she cannot look at herself due to her previous mastectomy.  She does have some pain under her right arm and using a pillow helps alleviate this pain.  She notes that she called and talked to [facility] while she was feeling poorly, who suggested that perhaps IV fluids after chemotherapy may be beneficial.  She wants to know when that might be able to happen.    Doris Smith denies any fever, chills, cough, chest pain, palpitations, shortness of breath, headaches, vision changes, mucositis, dysphagia, bowel/bladder changes.     HISTORY OF CURRENT ILLNESS: "Doris Smith" presented to her PCP, Dr. Hassell Done, with right breast pain, fullness, and nipple inversion since November 2019. She proceeded to undergo bilateral diagnostic mammography with tomography and right breast ultrasonography at [facility] on 07/31/2018 showing: findings compatible with multicentric breast cancer spanning at least the upper inner and upper outer quadrants of the right breast; normal right axilla. Palpable firmness of approximately 1.5 cm was noted in the 9 o'clock position, which was also seen on ultrasound with indisctinct margins measuring 2.7 x 2.3 x 2.2 cm. At 1 o'clock, a mass with poorly defined margins measures 1.9 x 1 x 0.8 cm. Additional poorly defined masses were seen in the 12 o'clock retroareolar region.  Accordingly on 08/02/2018 she proceeded to biopsy of the right breast area in question. The pathology ([id number]) from this procedure showed: invasive mammary carcinoma at 9 o'clock and 1 o'clock; e-cadherin negative, consistent with lobular phenotype; grade 2-3. Prognostic indicators significant for: estrogen receptor, 90% positive and progesterone receptor, 1% positive, both with strong staining intensity. Proliferation marker Ki67 at 1%. HER2 equivocal by immunohistochemistry, 2+ but  negative by fluorescent in situ hybridization with a signals ratio 1.04 and number per cell 1.2.   The patient's subsequent  history is as detailed below.   PAST MEDICAL HISTORY: Past Medical History:  Diagnosis Date   A-fib ([place]) 11/2012   PAF   Anxiety    takes Ativan   Asthma 04/07/2011   dx   Bipolar disorder ([place])    Cancer ([place])    Right breast   Depression    Dyspnea    DOE   Early cataracts, bilateral    Fatty liver    Fibromyalgia    Fracture, ankle 10/2008   right and left   GERD (gastroesophageal reflux disease)    Irritable bowel syndrome    Mental disorder    dx bipolar   PONV (postoperative nausea and vomiting)    Sleep apnea 04/2011    does not wear CPAP   Wears glasses   She has chronic sinus headaches, hiatal hernia   PAST SURGICAL HISTORY: Past Surgical History:  Procedure Laterality Date   ABDOMINAL HYSTERECTOMY     COLONOSCOPY     DILATION AND CURETTAGE OF UTERUS  1981   abnormal pap   KNEE ARTHROSCOPY Left 2007   x 2   LUMBAR LAMINECTOMY/DECOMPRESSION MICRODISCECTOMY  05/31/2011   Procedure: LUMBAR LAMINECTOMY/DECOMPRESSION MICRODISCECTOMY;  Surgeon: Johnn Hai;  Location: [facility];  Service: Orthopedics;  Laterality: Left;  Decompression Lumbar four to five and  Lumbar five to Sacral one on Left  (X-Ray)   MASTECTOMY W/ SENTINEL NODE BIOPSY Right 09/16/2018   Procedure: RIGHT MASTECTOMY WITH RIGHT AXILLARY SENTINEL LYMPH NODE BIOPSY;  Surgeon: Alphonsa Overall, MD;  Location: [facility];  Service: General;  Laterality: Right;   PARTIAL HYSTERECTOMY  1982   PORTACATH PLACEMENT Left 10/31/2018   Procedure: INSERTION PORT-A-CATH WITH ULTRASOUND;  Surgeon: Alphonsa Overall, MD;  Location: [facility];  Service: General;  Laterality: Left;   TONSILLECTOMY     as child   TOTAL KNEE ARTHROPLASTY Left 12/18/2005    FAMILY HISTORY Family History  Problem Relation Age of Onset   Anesthesia problems Mother    Heart disease Mother        CHF, atrial fib   Heart failure Mother    Anesthesia problems Sister    Colon polyps Father     Heart disease Father        "Fluid around the heart"   Hypertension Sister    Breast cancer Maternal Aunt    Colon cancer Maternal Aunt    Prostate cancer Neg Hx    Ovarian cancer Neg Hx    As of February 2019, patient father is alive at 63 years old. Patient mother is alive at 66 years old. She notes a family hx of breast cancer. A maternal aunt was diagnosed with breast cancer, but Doris Smith is unsure if it was in one or both breasts. She has 3 siblings, 3 sisters and 0 brothers.   GYNECOLOGIC HISTORY:  No LMP recorded. Patient is postmenopausal. Menarche: 68 years old Age at first live birth: 68 years old [place] P 2 LMP about 42 years ago Contraceptive no HRT no  Hysterectomy? partial So? no   SOCIAL HISTORY: Doris Smith worked in [employer] for 15 years. She is on disability because her back, knees, and nerves. Her husband, [name], has been at [employer] 43 years as a Freight forwarder.  Even though their home is in [place] they are actually living in [place] in an  apartment while her husband works there.  Son Legrand Como, age 39, works as a Chief Strategy Officer in [place], [place]. Son Shanon Brow, age 80, works as a Building control surveyor in [place], [place]. They have 4 grandchildren, 2 great-grandchildren with 2 more on the way. She attends a [employer].     ADVANCED DIRECTIVES:    HEALTH MAINTENANCE: Social History   Tobacco Use   Smoking status: Never Smoker   Smokeless tobacco: Never Used  Substance Use Topics   Alcohol use: No   Drug use: No     Colonoscopy: 2019  PAP: 11/03/2014, normal  Bone density: 10/13/2016, T-score of -2.1   Allergies  Allergen Reactions   Iohexol     "over 20 years ago" had reaction "hurting in arm and heart"-Done in [place], [place].Marland Kitchenphysician stopped CT at that time.   Codeine Other (See Comments)    Makes feel like she is wild    Prednisone Other (See Comments)    Makes me very irritable   Sulfonamide Derivatives Other (See Comments)    Upset stomach   Celecoxib Nausea  And Vomiting   Sulfa Antibiotics Nausea And Vomiting   Vitamin B12 Rash    Current Outpatient Medications  Medication Sig Dispense Refill   anastrozole (ARIMIDEX) 1 MG tablet      benzonatate (TESSALON PERLES) 100 MG capsule Take 1 capsule (100 mg total) by mouth 3 (three) times daily as needed for cough. 20 capsule 0   budesonide-formoterol (SYMBICORT) 160-4.5 MCG/ACT inhaler Inhale 2 puffs into the lungs 2 (two) times daily.     clotrimazole-betamethasone (LOTRISONE) cream Apply 1 application topically 2 (two) times daily. (Patient taking differently: Apply 1 application topically 2 (two) times daily as needed (rash). ) 30 g 0   dexamethasone (DECADRON) 4 MG tablet Take 1 tablet (4 mg total) by mouth daily. Start the day after chemotherapy for 2 days. Take with food. 30 tablet 1   esomeprazole (NEXIUM) 40 MG capsule TAKE 1 CAPSULE BY MOUTH ONCE DAILY 30 MINUTES BEFORE BREAKFAST 30 capsule 0   fluticasone (FLONASE) 50 MCG/ACT nasal spray Place 2 sprays into both nostrils daily. 16 g 6   fluticasone furoate-vilanterol (BREO ELLIPTA) 200-25 MCG/INH AEPB Inhale 1 puff into the lungs daily.     gabapentin (NEURONTIN) 300 MG capsule Take 1 capsule (300 mg total) by mouth 3 (three) times daily. (Patient taking differently: Take 300 mg by mouth 2 (two) times daily. ) 90 capsule 5   lamoTRIgine (LAMICTAL) 100 MG tablet Take 100 mg by mouth 2 (two) times daily.      lidocaine-prilocaine (EMLA) cream Apply to affected area once 30 g 3   LORazepam (ATIVAN) 0.5 MG tablet Take 1 tablet (0.5 mg total) by mouth 2 (two) times daily as needed. 30 tablet 2   LORazepam (ATIVAN) 0.5 MG tablet Take 1 tablet (0.5 mg total) by mouth at bedtime as needed (Nausea or vomiting). 30 tablet 0   montelukast (SINGULAIR) 10 MG tablet Take 10 mg by mouth at bedtime.     prochlorperazine (COMPAZINE) 10 MG tablet Take 1 tablet (10 mg total) by mouth every 6 (six) hours as needed (Nausea or vomiting). 30 tablet 1    traZODone (DESYREL) 50 MG tablet Take 50 mg by mouth at bedtime.     Vilazodone HCl (VIIBRYD) 40 MG TABS Take 40 mg by mouth daily.     No current facility-administered medications for this visit.    Facility-Administered Medications Ordered in Other Visits  Medication Dose Route Frequency Provider Last  Rate Last Dose   sodium chloride flush (NS) 0.9 % injection 10 mL  10 mL Intracatheter PRN Magrinat, Virgie Dad, MD   10 mL at 12/17/18 1323    OBJECTIVE: Vitals:   12/17/18 0941  BP: (!) 106/39  Pulse: 71  Resp: 17  Temp: 98 F (36.7 C)  SpO2: 100%     Body mass index is 37.37 kg/m.   Wt Readings from Last 3 Encounters:  12/17/18 217 lb 11.2 oz (98.7 kg)  11/26/18 218 lb (98.9 kg)  11/12/18 213 lb (96.6 kg)  ECOG FS:1 - Symptomatic but completely ambulatory GENERAL: Patient is a well appearing female in no acute distress HEENT:  Sclerae anicteric.  Oropharynx clear and moist. No ulcerations or evidence of oropharyngeal candidiasis. Neck is supple.  NODES:  No cervical, supraclavicular, or axillary lymphadenopathy palpated.  BREAST EXAM:  Deferred. LUNGS:  Clear to auscultation bilaterally.  No wheezes or rhonchi. HEART:  Regular rate and rhythm. No murmur appreciated. ABDOMEN:  Soft, nontender.  Positive, normoactive bowel sounds. No organomegaly palpated. MSK:  No focal spinal tenderness to palpation. Full range of motion bilaterally in the upper extremities. EXTREMITIES:  No peripheral edema.   SKIN:  Clear with no obvious rashes or skin changes. No nail dyscrasia. NEURO:  Nonfocal. Well oriented.  Appropriate affect.     LAB RESULTS:  CMP     Component Value Date/Time   NA 145 12/17/2018 0815   NA 142 10/13/2016 1045   K 3.9 12/17/2018 0815   CL 111 12/17/2018 0815   CO2 26 12/17/2018 0815   GLUCOSE 128 (H) 12/17/2018 0815   BUN 7 (L) 12/17/2018 0815   BUN 8 10/13/2016 1045   CREATININE 0.90 12/17/2018 0815   CREATININE 0.83 08/14/2018 0758   CALCIUM 8.4  (L) 12/17/2018 0815   PROT 6.3 (L) 12/17/2018 0815   PROT 6.3 10/13/2016 1045   ALBUMIN 3.2 (L) 12/17/2018 0815   ALBUMIN 3.9 10/13/2016 1045   AST 18 12/17/2018 0815   AST 14 (L) 08/14/2018 0758   ALT 12 12/17/2018 0815   ALT 10 08/14/2018 0758   ALKPHOS 110 12/17/2018 0815   BILITOT 0.3 12/17/2018 0815   BILITOT 0.5 08/14/2018 0758   GFRNONAA >60 12/17/2018 0815   GFRNONAA >60 08/14/2018 0758   GFRAA >60 12/17/2018 0815   GFRAA >60 08/14/2018 0758    No results found for: TOTALPROTELP, ALBUMINELP, A1GS, A2GS, BETS, BETA2SER, GAMS, MSPIKE, SPEI  No results found for: KPAFRELGTCHN, LAMBDASER, KAPLAMBRATIO  Lab Results  Component Value Date   WBC 3.5 (L) 12/17/2018   NEUTROABS 1.1 (L) 12/17/2018   HGB 10.9 (L) 12/17/2018   HCT 35.3 (L) 12/17/2018   MCV 91.7 12/17/2018   PLT 342 12/17/2018    _0 @  No results found for: LABCA2  No components found for: YWVPXT062  No results for input(s): INR in the last 168 hours.  No results found for: LABCA2  No results found for: IRS854  No results found for: OEV035  No results found for: KKX381  No results found for: CA2729  No components found for: HGQUANT  No results found for: CEA1 / No results found for: CEA1   No results found for: AFPTUMOR  No results found for: CHROMOGRNA  No results found for: PSA1  Appointment on 12/17/2018  Component Date Value Ref Range Status   WBC 12/17/2018 3.5* 4.0 - 10.5 K/uL Final   RBC 12/17/2018 3.85* 3.87 - 5.11 MIL/uL Final   Hemoglobin 12/17/2018 10.9* 12.0 -  15.0 g/dL Final   HCT 12/17/2018 35.3* 36.0 - 46.0 % Final   MCV 12/17/2018 91.7  80.0 - 100.0 fL Final   MCH 12/17/2018 28.3  26.0 - 34.0 pg Final   MCHC 12/17/2018 30.9  30.0 - 36.0 g/dL Final   RDW 12/17/2018 15.1  11.5 - 15.5 % Final   Platelets 12/17/2018 342  150 - 400 K/uL Final   nRBC 12/17/2018 0.6* 0.0 - 0.2 % Final   Neutrophils Relative % 12/17/2018 32  % Final   Neutro Abs  12/17/2018 1.1* 1.7 - 7.7 K/uL Final   Lymphocytes Relative 12/17/2018 21  % Final   Lymphs Abs 12/17/2018 0.8  0.7 - 4.0 K/uL Final   Monocytes Relative 12/17/2018 33  % Final   Monocytes Absolute 12/17/2018 1.2* 0.1 - 1.0 K/uL Final   Eosinophils Relative 12/17/2018 3  % Final   Eosinophils Absolute 12/17/2018 0.1  0.0 - 0.5 K/uL Final   Basophils Relative 12/17/2018 2  % Final   Basophils Absolute 12/17/2018 0.1  0.0 - 0.1 K/uL Final   WBC Morphology 12/17/2018 METAMYELOCYTES AND MYELOCYTES PRESENT.   Final   Immature Granulocytes 12/17/2018 9  % Final   Abs Immature Granulocytes 12/17/2018 0.31* 0.00 - 0.07 K/uL Final   Polychromasia 12/17/2018 PRESENT   Final   Performed at [facility], [place] [address], [place], [place] [address]   Sodium 12/17/2018 145  135 - 145 mmol/L Final   Potassium 12/17/2018 3.9  3.5 - 5.1 mmol/L Final   Chloride 12/17/2018 111  98 - 111 mmol/L Final   CO2 12/17/2018 26  22 - 32 mmol/L Final   Glucose, Bld 12/17/2018 128* 70 - 99 mg/dL Final   BUN 12/17/2018 7* 8 - 23 mg/dL Final   Creatinine, Ser 12/17/2018 0.90  0.44 - 1.00 mg/dL Final   Calcium 12/17/2018 8.4* 8.9 - 10.3 mg/dL Final   Total Protein 12/17/2018 6.3* 6.5 - 8.1 g/dL Final   Albumin 12/17/2018 3.2* 3.5 - 5.0 g/dL Final   AST 12/17/2018 18  15 - 41 U/L Final   ALT 12/17/2018 12  0 - 44 U/L Final   Alkaline Phosphatase 12/17/2018 110  38 - 126 U/L Final   Total Bilirubin 12/17/2018 0.3  0.3 - 1.2 mg/dL Final   GFR calc non Af Amer 12/17/2018 >60  >60 mL/min Final   GFR calc Af Amer 12/17/2018 >60  >60 mL/min Final   Anion gap 12/17/2018 8  5 - 15 Final   Performed at [facility], [address] [place], [place] [address]    (this displays the last labs from the last 3 days)  No results found for: TOTALPROTELP, ALBUMINELP, A1GS, A2GS, BETS, BETA2SER, GAMS, MSPIKE, SPEI (this displays SPEP labs)  No results  found for: KPAFRELGTCHN, LAMBDASER, KAPLAMBRATIO (kappa/lambda light chains)  No results found for: HGBA, HGBA2QUANT, HGBFQUANT, HGBSQUAN (Hemoglobinopathy evaluation)   No results found for: LDH  No results found for: IRON, TIBC, IRONPCTSAT (Iron and TIBC)  No results found for: FERRITIN  Urinalysis    Component Value Date/Time   COLORURINE YELLOW 05/25/2011 [place] 05/03/2018 1204   LABSPEC 1.011 05/25/2011 0923   PHURINE 7.0 05/25/2011 0923   GLUCOSEU Negative 05/03/2018 [place] 05/25/2011 [place] Negative 05/03/2018 [place] 05/25/2011 0923   PROTEINUR Negative 05/03/2018 [place] 05/25/2011 0923   UROBILINOGEN negative 11/03/2014 1039  UROBILINOGEN 0.2 05/25/2011 0923   NITRITE Negative 05/03/2018 1204   NITRITE NEGATIVE 05/25/2011 0923   LEUKOCYTESUR Negative 05/03/2018 1204     STUDIES: No results found.  ELIGIBLE FOR AVAILABLE RESEARCH PROTOCOL: no   ASSESSMENT: 68 y.o. [place], [place] woman status post right breast biopsy 08/02/2018 for a clinical T3 N0, stage IIA invasive lobular carcinoma, grade 2, estrogen receptor strongly positive, progesterone receptor 1% positive, with no HER-2 amplification and an MIB-1 of 1%.  (a) CT scan of the head and chest, without contrast 08/23/2018 showed nonspecific 0.4 cm left lower lobe pulmonary nodule, no definitive metastatic disease  (b) bone scan 08/23/2018 shows multiple spinal areas of abnormal uptake, but  (c) total spinal MRI 09/03/2018 finds bone scan findings to be secondary to degenerative disease, no evidence of metastatic disease.  (1) status post right mastectomy and sentinel lymph node sampling 09/16/2018 showing a pT3 pN1(mic), stage IIA-IIIA invasive lobular breast cancer, grade 2, with 1 of 5 sampled lymph nodes involved by micrometastatic deposit, with extra nodular extension; ample margins  (2)  MammaPrint "high risk" suggests a  5-year metastasis free survival of 93% with chemotherapy, with an absolute chemotherapy benefit in the greater than 12% range  (3) adjuvant radiation to follow (can be done concurrently with chemotherapy)  (4) anastrozole started neoadjuvantly (on 08/14/2018), discontinued 10/14/2018 in preparation for chemotherapy  (5) Caris requested 10/14/2018  (6) adjuvant chemotherapy consisting of cyclophosphamide, methotrexate, fluorouracil (CMF) started 11/05/2018, to be repeated every 21 days x 8   PLAN: Doris Smith is tearful today.  She is having a difficult time with her cancer diagnosis, fatigue from chemotherapy, and body image changes.  I gave her reassurance today.  I reviewed her labs and her slightly low blood pressure.  I let her know that her labs are stable to receive the chemotherapy, and that we would give her IV fluids today, tomorrow, Thursday, and next week as well.  She is in agreement.   Cindy's ANC is 1.1.  She can receive her treatment today, but when she returns for IV fluids will need Udenyca to prevent chemotherapy delays from neutropenia.  I gave her info on this in her AVS and recommended she start taking Claritin tomorrow to help with it and take it daily for the next week.    I recommended Cindy take Compazine as needed for her nausea.  She will return in one week for labs and f/u prior to receiving IV fluids.  She knows to call for any other issue that may develop before her next visit.  A total of (30) minutes of face-to-face time was spent with this patient with greater than 50% of that time in counseling and care-coordination.    Wilber Bihari, NP  12/17/18 4:08 PM Medical Oncology and Hematology [facility] [address] [place], [place] [address] Tel. [phone]    Fax. [phone]

## 2018-12-18 ENCOUNTER — Inpatient Hospital Stay: Payer: BC Managed Care – PPO

## 2018-12-18 ENCOUNTER — Ambulatory Visit
Admission: RE | Admit: 2018-12-18 | Discharge: 2018-12-18 | Disposition: A | Discharge status: Home / Self Care | Payer: BLUE CROSS/BLUE SHIELD | Source: Ambulatory Visit | Attending: Radiation Oncology | Admitting: Radiation Oncology

## 2018-12-18 ENCOUNTER — Encounter: Payer: Self-pay | Admitting: Radiation Oncology

## 2018-12-18 ENCOUNTER — Other Ambulatory Visit: Payer: Self-pay

## 2018-12-18 ENCOUNTER — Encounter: Payer: Self-pay | Admitting: *Deleted

## 2018-12-18 VITALS — Ht 64.0 in | Wt 217.0 lb

## 2018-12-18 VITALS — BP 97/44 | HR 82 | Temp 98.9°F | Resp 20

## 2018-12-18 DIAGNOSIS — J449 Chronic obstructive pulmonary disease, unspecified: Secondary | ICD-10-CM | POA: Diagnosis not present

## 2018-12-18 DIAGNOSIS — Z95828 Presence of other vascular implants and grafts: Secondary | ICD-10-CM

## 2018-12-18 DIAGNOSIS — C50811 Malignant neoplasm of overlapping sites of right female breast: Secondary | ICD-10-CM

## 2018-12-18 DIAGNOSIS — F419 Anxiety disorder, unspecified: Secondary | ICD-10-CM | POA: Diagnosis not present

## 2018-12-18 DIAGNOSIS — R031 Nonspecific low blood-pressure reading: Secondary | ICD-10-CM | POA: Diagnosis not present

## 2018-12-18 DIAGNOSIS — Z9011 Acquired absence of right breast and nipple: Secondary | ICD-10-CM | POA: Diagnosis not present

## 2018-12-18 DIAGNOSIS — C50411 Malignant neoplasm of upper-outer quadrant of right female breast: Secondary | ICD-10-CM

## 2018-12-18 DIAGNOSIS — R530 Neoplastic (malignant) related fatigue: Secondary | ICD-10-CM | POA: Diagnosis not present

## 2018-12-18 DIAGNOSIS — Z170 Estrogen receptor positive status [ER+]: Secondary | ICD-10-CM

## 2018-12-18 DIAGNOSIS — Z5189 Encounter for other specified aftercare: Secondary | ICD-10-CM | POA: Diagnosis not present

## 2018-12-18 DIAGNOSIS — R110 Nausea: Secondary | ICD-10-CM | POA: Diagnosis not present

## 2018-12-18 DIAGNOSIS — Z5111 Encounter for antineoplastic chemotherapy: Secondary | ICD-10-CM | POA: Diagnosis not present

## 2018-12-18 DIAGNOSIS — C50911 Malignant neoplasm of unspecified site of right female breast: Secondary | ICD-10-CM

## 2018-12-18 DIAGNOSIS — D701 Agranulocytosis secondary to cancer chemotherapy: Secondary | ICD-10-CM | POA: Diagnosis not present

## 2018-12-18 DIAGNOSIS — C773 Secondary and unspecified malignant neoplasm of axilla and upper limb lymph nodes: Secondary | ICD-10-CM | POA: Diagnosis not present

## 2018-12-18 DIAGNOSIS — R05 Cough: Secondary | ICD-10-CM | POA: Diagnosis not present

## 2018-12-18 DIAGNOSIS — C50211 Malignant neoplasm of upper-inner quadrant of right female breast: Secondary | ICD-10-CM | POA: Diagnosis not present

## 2018-12-18 MED ORDER — SODIUM CHLORIDE 0.9 % IV SOLN
INTRAVENOUS | Status: DC
  Administered 2018-12-18: 11:00:00 via INTRAVENOUS
  Filled 2018-12-18 (×2): qty 250

## 2018-12-18 NOTE — Patient Instructions (Addendum)
Steroid injection scheduled December 25, 2018 with Dr. Livia Snellen at 1055am.  Dehydration, Adult  Dehydration is when there is not enough fluid or water in your body. This happens when you lose more fluids than you take in. Dehydration can range from mild to very bad. It should be treated right away to keep it from getting very bad. Symptoms of mild dehydration may include:  Thirst.  Dry lips.  Slightly dry mouth.  Dry, warm skin.  Dizziness. Symptoms of moderate dehydration may include:  Very dry mouth.  Muscle cramps.  Dark pee (urine). Pee may be the color of tea.  Your body making less pee.  Your eyes making fewer tears.  Heartbeat that is uneven or faster than normal (palpitations).  Headache.  Light-headedness, especially when you stand up from sitting.  Fainting (syncope). Symptoms of very bad dehydration may include:  Changes in skin, such as: ? Cold and clammy skin. ? Blotchy (mottled) or pale skin. ? Skin that does not quickly return to normal after being lightly pinched and let go (poor skin turgor).  Changes in body fluids, such as: ? Feeling very thirsty. ? Your eyes making fewer tears. ? Not sweating when body temperature is high, such as in hot weather. ? Your body making very little pee.  Changes in vital signs, such as: ? Weak pulse. ? Pulse that is more than 100 beats a minute when you are sitting still. ? Fast breathing. ? Low blood pressure.  Other changes, such as: ? Sunken eyes. ? Cold hands and feet. ? Confusion. ? Lack of energy (lethargy). ? Trouble waking up from sleep. ? Short-term weight loss. ? Unconsciousness. Follow these instructions at home:   If told by your doctor, drink an ORS: ? Make an ORS by using instructions on the package. ? Start by drinking small amounts, about  cup (120 mL) every 5-10 minutes. ? Slowly drink more until you have had the amount that your doctor said to have.  Drink enough clear fluid to keep  your pee clear or pale yellow. If you were told to drink an ORS, finish the ORS first, then start slowly drinking clear fluids. Drink fluids such as: ? Water. Do not drink only water by itself. Doing that can make the salt (sodium) level in your body get too low (hyponatremia). ? Ice chips. ? Fruit juice that you have added water to (diluted). ? Low-calorie sports drinks.  Avoid: ? Alcohol. ? Drinks that have a lot of sugar. These include high-calorie sports drinks, fruit juice that does not have water added, and soda. ? Caffeine. ? Foods that are greasy or have a lot of fat or sugar.  Take over-the-counter and prescription medicines only as told by your doctor.  Do not take salt tablets. Doing that can make the salt level in your body get too high (hypernatremia).  Eat foods that have minerals (electrolytes). Examples include bananas, oranges, potatoes, tomatoes, and spinach.  Keep all follow-up visits as told by your doctor. This is important. Contact a doctor if:  You have belly (abdominal) pain that: ? Gets worse. ? Stays in one area (localizes).  You have a rash.  You have a stiff neck.  You get angry or annoyed more easily than normal (irritability).  You are more sleepy than normal.  You have a harder time waking up than normal.  You feel: ? Weak. ? Dizzy. ? Very thirsty.  You have peed (urinated) only a small amount of very dark  pee during 6-8 hours. Get help right away if:  You have symptoms of very bad dehydration.  You cannot drink fluids without throwing up (vomiting).  Your symptoms get worse with treatment.  You have a fever.  You have a very bad headache.  You are throwing up or having watery poop (diarrhea) and it: ? Gets worse. ? Does not go away.  You have blood or something green (bile) in your throw-up.  You have blood in your poop (stool). This may cause poop to look black and tarry.  You have not peed in 6-8 hours.  You pass out  (faint).  Your heart rate when you are sitting still is more than 100 beats a minute.  You have trouble breathing. This information is not intended to replace advice given to you by your health care provider. Make sure you discuss any questions you have with your health care provider. Document Released: 04/22/2009 Document Revised: 01/14/2016 Document Reviewed: 08/20/2015 Elsevier Interactive Patient Education  2019 [employer].

## 2018-12-18 NOTE — Progress Notes (Signed)
Per NP Delice Bison, okay for Cindy to have steroid injection for knee from PCP next week (week of June 15th).

## 2018-12-18 NOTE — Progress Notes (Signed)
Radiation Oncology         [phone] ________________________________  Outpatient Consultation - Conducted via telephone due to current COVID-19 concerns for limiting patient exposure.   I spoke with the patient to conduct this consult visit via telephone to spare the patient unnecessary potential exposure in the healthcare setting during the current COVID-19 pandemic. The patient was notified in advance and was offered a [place] meeting to allow for face to face communication but unfortunately reported that they did not have the appropriate resources/technology to support such a visit and instead preferred to proceed with a telephone consult.   ________________________________  Name: Doris Smith        MRN: [id number]  Date of Service: 12/18/2018 DOB: 1951/01/12  SU:PJSRPR, [name], FNP  Magrinat, Virgie Dad, MD     REFERRING PHYSICIAN: Magrinat, Virgie Dad, MD   DIAGNOSIS: The primary encounter diagnosis was Malignant neoplasm of overlapping sites of right breast in female, estrogen receptor positive ([place]). Diagnoses of Breast cancer, stage 2, right ([place]) and Malignant neoplasm of upper-outer quadrant of right breast in female, estrogen receptor positive ([place]) were also pertinent to this visit.   HISTORY OF PRESENT ILLNESS: Doris Smith is a 68 y.o. female originally seen in the multidisciplinary breast clinic for a new diagnosis of right breast cancer. The patient was noted to have right breast firmness and nipple inversion x 2 months.  Diagnostic mammography with tomography and right breast ultrasonography on 07/31/2018 showing: Findings compatible with multicentric breast cancer spanning at least the upper inner and upper outer quadrants of the right breast and retroareolar regions. No evidence of malignancy in the left breast and no adenopathy.  On 08/02/2018 she proceeded to biopsy of the right breast area in question. The pathology from this procedure showed: 1. Breast,  right, needle core biopsy, 9 o'clock, 6cmfn with invasive mammary carcinoma, grade 2-3. 2. Breast, right, needle core biopsy, 1 o'clock, 3cmfn with invasive mammary carcinoma, grade 2-3. Prognostic indicators significant for: ER, 90% positive and PR, 1% positive, both with strong staining intensity. Proliferation marker Ki67 at 1%. HER2 negative.  She underwent right mastectomy with right axillary lymph node biopsy on 09/16/2018.  Final pathology revealed a grade 2, 10.5 cm invasive lobular carcinoma.  Her resection margins were negative, and 1 of the 5 lymph nodes contained micrometastatic disease.  She did have MammaPrint testing that was high risk, and she subsequently began systemic Cytoxan, 5-FU and methotrexate beginning on 11/05/2018.  She has completed cycle #3 yesterday, and is contacted by phone to coordinate beginning postmastectomy radiotherapy prior to her next cycle which is due on 01/07/2019.     PREVIOUS RADIATION THERAPY: No   PAST MEDICAL HISTORY:  Past Medical History:  Diagnosis Date  . A-fib ([place]) 11/2012   PAF  . Anxiety    takes Ativan  . Asthma 04/07/2011   dx  . Bipolar disorder ([place])   . Cancer [facility])    Right breast  . Depression   . Dyspnea    DOE  . Early cataracts, bilateral   . Fatty liver   . Fibromyalgia   . Fracture, ankle 10/2008   right and left  . GERD (gastroesophageal reflux disease)   . Irritable bowel syndrome   . Mental disorder    dx bipolar  . PONV (postoperative nausea and vomiting)   . Sleep apnea 04/2011    does not wear CPAP  . Wears glasses        PAST SURGICAL  HISTORY: Past Surgical History:  Procedure Laterality Date  . ABDOMINAL HYSTERECTOMY    . COLONOSCOPY    . DILATION AND CURETTAGE OF UTERUS  1981   abnormal pap  . KNEE ARTHROSCOPY Left 2007   x 2  . LUMBAR LAMINECTOMY/DECOMPRESSION MICRODISCECTOMY  05/31/2011   Procedure: LUMBAR LAMINECTOMY/DECOMPRESSION MICRODISCECTOMY;  Surgeon: Johnn Hai;  Location: [facility];   Service: Orthopedics;  Laterality: Left;  Decompression Lumbar four to five and  Lumbar five to Sacral one on Left  (X-Ray)  . MASTECTOMY W/ SENTINEL NODE BIOPSY Right 09/16/2018   Procedure: RIGHT MASTECTOMY WITH RIGHT AXILLARY SENTINEL LYMPH NODE BIOPSY;  Surgeon: Alphonsa Overall, MD;  Location: [place];  Service: General;  Laterality: Right;  . PARTIAL HYSTERECTOMY  1982  . PORTACATH PLACEMENT Left 10/31/2018   Procedure: INSERTION PORT-A-CATH WITH ULTRASOUND;  Surgeon: Alphonsa Overall, MD;  Location: [place];  Service: General;  Laterality: Left;  . TONSILLECTOMY     as child  . TOTAL KNEE ARTHROPLASTY Left 12/18/2005     FAMILY HISTORY:  Family History  Problem Relation Age of Onset  . Anesthesia problems Mother   . Heart disease Mother        CHF, atrial fib  . Heart failure Mother   . Anesthesia problems Sister   . Colon polyps Father   . Heart disease Father        "Fluid around the heart"  . Hypertension Sister   . Breast cancer Maternal Aunt   . Colon cancer Maternal Aunt   . Prostate cancer Neg Hx   . Ovarian cancer Neg Hx      SOCIAL HISTORY:  reports that she has never smoked. She has never used smokeless tobacco. She reports that she does not drink alcohol or use drugs. The patient is married and is currently staying in [place], though her permanent address is [place].    ALLERGIES: Iohexol; Codeine; Prednisone; Sulfonamide derivatives; Celecoxib; Sulfa antibiotics; and Vitamin b12   MEDICATIONS:  Current Outpatient Medications  Medication Sig Dispense Refill  . benzonatate (TESSALON PERLES) 100 MG capsule Take 1 capsule (100 mg total) by mouth 3 (three) times daily as needed for cough. 20 capsule 0  . budesonide-formoterol (SYMBICORT) 160-4.5 MCG/ACT inhaler Inhale 2 puffs into the lungs 2 (two) times daily.    . clotrimazole-betamethasone (LOTRISONE) cream Apply 1 application topically 2 (two) times daily. (Patient taking differently: Apply 1  application topically 2 (two) times daily as needed (rash). ) 30 g 0  . dexamethasone (DECADRON) 4 MG tablet Take 1 tablet (4 mg total) by mouth daily. Start the day after chemotherapy for 2 days. Take with food. 30 tablet 1  . esomeprazole (NEXIUM) 40 MG capsule TAKE 1 CAPSULE BY MOUTH ONCE DAILY 30 MINUTES BEFORE BREAKFAST 30 capsule 0  . fluticasone (FLONASE) 50 MCG/ACT nasal spray Place 2 sprays into both nostrils daily. 16 g 6  . fluticasone furoate-vilanterol (BREO ELLIPTA) 200-25 MCG/INH AEPB Inhale 1 puff into the lungs daily.    Marland Kitchen gabapentin (NEURONTIN) 300 MG capsule Take 1 capsule (300 mg total) by mouth 3 (three) times daily. (Patient taking differently: Take 300 mg by mouth 2 (two) times daily. ) 90 capsule 5  . ibuprofen (ADVIL) 200 MG tablet Take 200 mg by mouth every 6 (six) hours as needed.    . lamoTRIgine (LAMICTAL) 100 MG tablet Take 100 mg by mouth 2 (two) times daily.     Marland Kitchen lidocaine-prilocaine (EMLA) cream Apply to affected  area once 30 g 3  . LORazepam (ATIVAN) 0.5 MG tablet Take 1 tablet (0.5 mg total) by mouth at bedtime as needed (Nausea or vomiting). 30 tablet 0  . montelukast (SINGULAIR) 10 MG tablet Take 10 mg by mouth at bedtime.    . prochlorperazine (COMPAZINE) 10 MG tablet Take 1 tablet (10 mg total) by mouth every 6 (six) hours as needed (Nausea or vomiting). 30 tablet 1  . Vilazodone HCl (VIIBRYD) 40 MG TABS Take 40 mg by mouth daily.    Marland Kitchen anastrozole (ARIMIDEX) 1 MG tablet      No current facility-administered medications for this encounter.      REVIEW OF SYSTEMS: On review of systems, the patient reports that she has been very tired and has struggled with nausea during the course of her chemotherapy. She feels pretty good today despite her previous symptoms and is going to receive IVF in the infusion room. She denies any   chest pain, shortness of breath, cough, fevers, chills, night sweats, unintended weight changes. She denies any bowel or bladder  disturbances, and denies abdominal pain, or vomiting. She continues to have pain in her right knee and back and these are chronic issues. She does have some pain in her right axilla along her surgical site and denies any significant edema. She denies any additional musculoskeletal or joint aches or pains. A complete review of systems is obtained and is otherwise negative.     PHYSICAL EXAM:  Wt Readings from Last 3 Encounters:  12/18/18 217 lb (98.4 kg)  12/17/18 217 lb 11.2 oz (98.7 kg)  11/26/18 218 lb (98.9 kg)   Temp Readings from Last 3 Encounters:  12/17/18 98 F (36.7 C) (Oral)  11/26/18 98.3 F (36.8 C) (Oral)  11/12/18 (!) 97 F (36.1 C) (Oral)   BP Readings from Last 3 Encounters:  12/17/18 (!) 106/39  11/26/18 118/60  11/12/18 124/66   Pulse Readings from Last 3 Encounters:  12/17/18 71  11/26/18 75  11/12/18 74   Pain Assessment Pain Score: 2 (port, right knee, right axilla, lower back) Pain Frequency: Constant Pain Loc: Back/10  Unable to assess due to nature of encounter.   ECOG = 1  0 - Asymptomatic (Fully active, able to carry on all predisease activities without restriction)  1 - Symptomatic but completely ambulatory (Restricted in physically strenuous activity but ambulatory and able to carry out work of a light or sedentary nature. For example, light housework, office work)  2 - Symptomatic, <50% in bed during the day (Ambulatory and capable of all self care but unable to carry out any work activities. Up and about more than 50% of waking hours)  3 - Symptomatic, >50% in bed, but not bedbound (Capable of only limited self-care, confined to bed or chair 50% or more of waking hours)  4 - Bedbound (Completely disabled. Cannot carry on any self-care. Totally confined to bed or chair)  5 - Death   Eustace Pen MM, Creech RH, Tormey DC, et al. [phone]). "Toxicity and response criteria of the [facility] Group". [place] Oncol. 5 (6): 649-55     LABORATORY DATA:  Lab Results  Component Value Date   WBC 3.5 (L) 12/17/2018   HGB 10.9 (L) 12/17/2018   HCT 35.3 (L) 12/17/2018   MCV 91.7 12/17/2018   PLT 342 12/17/2018   Lab Results  Component Value Date   NA 145 12/17/2018   K 3.9 12/17/2018   CL 111 12/17/2018  CO2 26 12/17/2018   Lab Results  Component Value Date   ALT 12 12/17/2018   AST 18 12/17/2018   ALKPHOS 110 12/17/2018   BILITOT 0.3 12/17/2018      RADIOGRAPHY: No results found.     IMPRESSION/PLAN: 1. Stage IB, pT3N71mM0, grade 2 ,ER/PR positive invasive lobular carcinoma of the right breast. Dr. MLisbeth Renshawhas reviewed her case, and I discussed the pathology findings and reviewed the nature of right lobular breast disease. She would benefit from external radiotherapy to the chest wall and regional nodes followed by antiestrogen therapy to reduce the risk of local and systemic recurrence. We discussed the risks, benefits, short, and long term effects of radiotherapy, and the patient is interested in proceeding. Dr. MLisbeth Renshawrecommends  a course of 6 1/2 weeks of radiotherapy. She will come to the office on WEdnesday next week to proceed with simulation. She gives verbal consent to proceed and will sign paperwork as well that day. We anticipate starting treatment on 12/30/2018 in the midst of cycle #3 of chemotherapy. I spoke with LWilber Bihari FNP and she is in agreement with this plan to start prior to her cycle #4. Methotrexate will be eliminated from her care plan during radiotherapy.  2. Right axillary pain and joint pains. The patient is going to follow up with her MD for consideration of joint injections later this week. She will continue to monitor her right axilla and if she notes edema will let uKoreaknow so we can coordinate PT evaluation, but she declines this assessment today.  3. Chemotherapy induced nausea. The patient will continue with IVF and prn antiemetics per medical oncology. We will follow this  expectantly.   Given current concerns for patient exposure during the COVID-19 pandemic, this encounter was conducted via telephone.  The patient has given verbal consent for this type of encounter. The time spent during this encounter was 30 minutes and 50% of that time was spent in the coordination of his care. The attendants for this meeting include AShona Simpson PPassav[facility] Hospitaland CAnders SimmondsCox  During the encounter, AShona SimpsonPThe [facility]borowas located at [facility].  CJennelle PinkstaffCox  was located in the infusion room of the cancer center.    ACarola Rhine PAC

## 2018-12-18 NOTE — Progress Notes (Signed)
See progress note under physician encounter. 

## 2018-12-19 ENCOUNTER — Other Ambulatory Visit: Payer: Self-pay

## 2018-12-19 ENCOUNTER — Inpatient Hospital Stay: Payer: BC Managed Care – PPO

## 2018-12-19 ENCOUNTER — Other Ambulatory Visit: Payer: Self-pay | Admitting: Adult Health

## 2018-12-19 ENCOUNTER — Encounter: Payer: Self-pay | Admitting: *Deleted

## 2018-12-19 ENCOUNTER — Telehealth: Payer: Self-pay

## 2018-12-19 VITALS — BP 98/57 | HR 101 | Temp 98.4°F | Resp 20

## 2018-12-19 DIAGNOSIS — Z170 Estrogen receptor positive status [ER+]: Secondary | ICD-10-CM

## 2018-12-19 DIAGNOSIS — Z95828 Presence of other vascular implants and grafts: Secondary | ICD-10-CM

## 2018-12-19 DIAGNOSIS — J449 Chronic obstructive pulmonary disease, unspecified: Secondary | ICD-10-CM | POA: Diagnosis not present

## 2018-12-19 DIAGNOSIS — F419 Anxiety disorder, unspecified: Secondary | ICD-10-CM | POA: Diagnosis not present

## 2018-12-19 DIAGNOSIS — C50911 Malignant neoplasm of unspecified site of right female breast: Secondary | ICD-10-CM

## 2018-12-19 DIAGNOSIS — R110 Nausea: Secondary | ICD-10-CM | POA: Diagnosis not present

## 2018-12-19 DIAGNOSIS — R05 Cough: Secondary | ICD-10-CM | POA: Diagnosis not present

## 2018-12-19 DIAGNOSIS — C50411 Malignant neoplasm of upper-outer quadrant of right female breast: Secondary | ICD-10-CM

## 2018-12-19 DIAGNOSIS — C50211 Malignant neoplasm of upper-inner quadrant of right female breast: Secondary | ICD-10-CM | POA: Diagnosis not present

## 2018-12-19 DIAGNOSIS — R530 Neoplastic (malignant) related fatigue: Secondary | ICD-10-CM | POA: Diagnosis not present

## 2018-12-19 DIAGNOSIS — R399 Unspecified symptoms and signs involving the genitourinary system: Secondary | ICD-10-CM

## 2018-12-19 DIAGNOSIS — Z5111 Encounter for antineoplastic chemotherapy: Secondary | ICD-10-CM | POA: Diagnosis not present

## 2018-12-19 DIAGNOSIS — Z9011 Acquired absence of right breast and nipple: Secondary | ICD-10-CM | POA: Diagnosis not present

## 2018-12-19 DIAGNOSIS — Z5189 Encounter for other specified aftercare: Secondary | ICD-10-CM | POA: Diagnosis not present

## 2018-12-19 DIAGNOSIS — D701 Agranulocytosis secondary to cancer chemotherapy: Secondary | ICD-10-CM | POA: Diagnosis not present

## 2018-12-19 DIAGNOSIS — R031 Nonspecific low blood-pressure reading: Secondary | ICD-10-CM | POA: Diagnosis not present

## 2018-12-19 LAB — BASIC METABOLIC PANEL - CANCER CENTER ONLY
Anion gap: 6 (ref 5–15)
BUN: 11 mg/dL (ref 8–23)
CO2: 26 mmol/L (ref 22–32)
Calcium: 7.3 mg/dL — ABNORMAL LOW (ref 8.9–10.3)
Chloride: 109 mmol/L (ref 98–111)
Creatinine: 0.9 mg/dL (ref 0.44–1.00)
GFR, Est AFR Am: >60 mL/min (ref >60)
GFR, Estimated: >60 mL/min (ref >60)
Glucose, Bld: 111 mg/dL — ABNORMAL HIGH (ref 70–99)
Potassium: 3.4 mmol/L — ABNORMAL LOW (ref 3.5–5.1)
Sodium: 141 mmol/L (ref 135–145)

## 2018-12-19 LAB — URINALYSIS, COMPLETE (UACMP) WITH MICROSCOPIC
Bacteria, UA: NONE SEEN
Bilirubin Urine: NEGATIVE
Glucose, UA: NEGATIVE mg/dL
Hgb urine dipstick: NEGATIVE
Ketones, ur: NEGATIVE mg/dL
Nitrite: NEGATIVE
Protein, ur: NEGATIVE mg/dL
Specific Gravity, Urine: 1.016 (ref 1.005–1.030)
pH: 5 (ref 5.0–8.0)

## 2018-12-19 MED ORDER — SODIUM CHLORIDE 0.9 % IV SOLN
INTRAVENOUS | Status: AC
  Administered 2018-12-19: 14:00:00 via INTRAVENOUS
  Filled 2018-12-19 (×2): qty 250

## 2018-12-19 MED ORDER — PEGFILGRASTIM-CBQV 6 MG/0.6ML ~~LOC~~ SOSY
6.0000 mg | PREFILLED_SYRINGE | Freq: Once | SUBCUTANEOUS | Status: AC
  Administered 2018-12-19: 6 mg via SUBCUTANEOUS

## 2018-12-19 MED ORDER — HEPARIN SOD (PORK) LOCK FLUSH 100 UNIT/ML IV SOLN
500.0000 [IU] | Freq: Once | INTRAVENOUS | Status: AC | PRN
  Administered 2018-12-19: 500 [IU]
  Filled 2018-12-19: qty 5

## 2018-12-19 MED ORDER — PEGFILGRASTIM-CBQV 6 MG/0.6ML ~~LOC~~ SOSY
PREFILLED_SYRINGE | SUBCUTANEOUS | Status: AC
  Filled 2018-12-19: qty 0.6

## 2018-12-19 MED ORDER — SODIUM CHLORIDE 0.9% FLUSH
10.0000 mL | INTRAVENOUS | Status: DC | PRN
  Administered 2018-12-19: 10 mL
  Filled 2018-12-19: qty 10

## 2018-12-19 NOTE — Telephone Encounter (Signed)
RN returned call.  No answer, voicemail has not been set up.

## 2018-12-19 NOTE — Patient Instructions (Signed)
Steroid injection scheduled December 25, 2018 with Dr. Livia Snellen at 1055am.  Dehydration, Adult  Dehydration is when there is not enough fluid or water in your body. This happens when you lose more fluids than you take in. Dehydration can range from mild to very bad. It should be treated right away to keep it from getting very bad. Symptoms of mild dehydration may include:  Thirst.  Dry lips.  Slightly dry mouth.  Dry, warm skin.  Dizziness. Symptoms of moderate dehydration may include:  Very dry mouth.  Muscle cramps.  Dark pee (urine). Pee may be the color of tea.  Your body making less pee.  Your eyes making fewer tears.  Heartbeat that is uneven or faster than normal (palpitations).  Headache.  Light-headedness, especially when you stand up from sitting.  Fainting (syncope). Symptoms of very bad dehydration may include:  Changes in skin, such as: ? Cold and clammy skin. ? Blotchy (mottled) or pale skin. ? Skin that does not quickly return to normal after being lightly pinched and let go (poor skin turgor).  Changes in body fluids, such as: ? Feeling very thirsty. ? Your eyes making fewer tears. ? Not sweating when body temperature is high, such as in hot weather. ? Your body making very little pee.  Changes in vital signs, such as: ? Weak pulse. ? Pulse that is more than 100 beats a minute when you are sitting still. ? Fast breathing. ? Low blood pressure.  Other changes, such as: ? Sunken eyes. ? Cold hands and feet. ? Confusion. ? Lack of energy (lethargy). ? Trouble waking up from sleep. ? Short-term weight loss. ? Unconsciousness. Follow these instructions at home:   If told by your doctor, drink an ORS: ? Make an ORS by using instructions on the package. ? Start by drinking small amounts, about  cup (120 mL) every 5-10 minutes. ? Slowly drink more until you have had the amount that your doctor said to have.  Drink enough clear fluid to keep  your pee clear or pale yellow. If you were told to drink an ORS, finish the ORS first, then start slowly drinking clear fluids. Drink fluids such as: ? Water. Do not drink only water by itself. Doing that can make the salt (sodium) level in your body get too low (hyponatremia). ? Ice chips. ? Fruit juice that you have added water to (diluted). ? Low-calorie sports drinks.  Avoid: ? Alcohol. ? Drinks that have a lot of sugar. These include high-calorie sports drinks, fruit juice that does not have water added, and soda. ? Caffeine. ? Foods that are greasy or have a lot of fat or sugar.  Take over-the-counter and prescription medicines only as told by your doctor.  Do not take salt tablets. Doing that can make the salt level in your body get too high (hypernatremia).  Eat foods that have minerals (electrolytes). Examples include bananas, oranges, potatoes, tomatoes, and spinach.  Keep all follow-up visits as told by your doctor. This is important. Contact a doctor if:  You have belly (abdominal) pain that: ? Gets worse. ? Stays in one area (localizes).  You have a rash.  You have a stiff neck.  You get angry or annoyed more easily than normal (irritability).  You are more sleepy than normal.  You have a harder time waking up than normal.  You feel: ? Weak. ? Dizzy. ? Very thirsty.  You have peed (urinated) only a small amount of very dark  pee during 6-8 hours. Get help right away if:  You have symptoms of very bad dehydration.  You cannot drink fluids without throwing up (vomiting).  Your symptoms get worse with treatment.  You have a fever.  You have a very bad headache.  You are throwing up or having watery poop (diarrhea) and it: ? Gets worse. ? Does not go away.  You have blood or something green (bile) in your throw-up.  You have blood in your poop (stool). This may cause poop to look black and tarry.  You have not peed in 6-8 hours.  You pass out  (faint).  Your heart rate when you are sitting still is more than 100 beats a minute.  You have trouble breathing. This information is not intended to replace advice given to you by your health care provider. Make sure you discuss any questions you have with your health care provider. Document Released: 04/22/2009 Document Revised: 01/14/2016 Document Reviewed: 08/20/2015 Elsevier Interactive Patient Education  2019 [employer].

## 2018-12-20 ENCOUNTER — Ambulatory Visit: Payer: BLUE CROSS/BLUE SHIELD | Admitting: Nurse Practitioner

## 2018-12-20 LAB — URINE CULTURE: Culture: 30000 — AB

## 2018-12-23 ENCOUNTER — Telehealth: Payer: Self-pay | Admitting: *Deleted

## 2018-12-23 NOTE — Telephone Encounter (Signed)
This RN returned VM to pt with Becka stating " this chemo has been the worst so far "  She states she has increased coughing and SOB. Increased vomiting when riding ( motion related ) Increased incontinence with urine and had episode with stool/ " just feel like curling up in a ball and not coming out " " hurting mentally and physically "  She denies any fevers.  " each chemo it gets worse and worse - what am I going to be like on the 5th treatment "  She states she has her faith and that keeps her going " but I know it's this chemo that is making me feel so bad "  Naz was tearful with discussion but able to state " I know you guys are trying to help me"  This RN validated pt's comments as well as verbal support given.  Appointment for tomorrow reviewed Shalin Linders was unaware of appointment ) as well as plan to discuss further with this RN requesting pt to possibly state which symptoms are the most life interfering that we may be able to work with for best outcome.

## 2018-12-24 ENCOUNTER — Inpatient Hospital Stay (HOSPITAL_BASED_OUTPATIENT_CLINIC_OR_DEPARTMENT_OTHER): Payer: BC Managed Care – PPO | Admitting: Adult Health

## 2018-12-24 ENCOUNTER — Inpatient Hospital Stay: Payer: BC Managed Care – PPO

## 2018-12-24 ENCOUNTER — Telehealth: Payer: Self-pay | Admitting: *Deleted

## 2018-12-24 ENCOUNTER — Encounter: Payer: Self-pay | Admitting: Adult Health

## 2018-12-24 ENCOUNTER — Other Ambulatory Visit: Payer: Self-pay

## 2018-12-24 VITALS — BP 112/58 | HR 97 | Temp 97.8°F | Resp 18 | Ht 64.0 in | Wt 217.8 lb

## 2018-12-24 DIAGNOSIS — R031 Nonspecific low blood-pressure reading: Secondary | ICD-10-CM | POA: Diagnosis not present

## 2018-12-24 DIAGNOSIS — D701 Agranulocytosis secondary to cancer chemotherapy: Secondary | ICD-10-CM | POA: Diagnosis not present

## 2018-12-24 DIAGNOSIS — Z170 Estrogen receptor positive status [ER+]: Secondary | ICD-10-CM

## 2018-12-24 DIAGNOSIS — C50411 Malignant neoplasm of upper-outer quadrant of right female breast: Secondary | ICD-10-CM | POA: Diagnosis not present

## 2018-12-24 DIAGNOSIS — Z5111 Encounter for antineoplastic chemotherapy: Secondary | ICD-10-CM | POA: Diagnosis not present

## 2018-12-24 DIAGNOSIS — R05 Cough: Secondary | ICD-10-CM | POA: Diagnosis not present

## 2018-12-24 DIAGNOSIS — R110 Nausea: Secondary | ICD-10-CM

## 2018-12-24 DIAGNOSIS — F419 Anxiety disorder, unspecified: Secondary | ICD-10-CM

## 2018-12-24 DIAGNOSIS — Z95828 Presence of other vascular implants and grafts: Secondary | ICD-10-CM

## 2018-12-24 DIAGNOSIS — C50211 Malignant neoplasm of upper-inner quadrant of right female breast: Secondary | ICD-10-CM | POA: Diagnosis not present

## 2018-12-24 DIAGNOSIS — Z9011 Acquired absence of right breast and nipple: Secondary | ICD-10-CM

## 2018-12-24 DIAGNOSIS — Z5189 Encounter for other specified aftercare: Secondary | ICD-10-CM | POA: Diagnosis not present

## 2018-12-24 DIAGNOSIS — R530 Neoplastic (malignant) related fatigue: Secondary | ICD-10-CM | POA: Diagnosis not present

## 2018-12-24 DIAGNOSIS — J449 Chronic obstructive pulmonary disease, unspecified: Secondary | ICD-10-CM | POA: Diagnosis not present

## 2018-12-24 LAB — CBC WITH DIFFERENTIAL/PLATELET
Abs Immature Granulocytes: 0 10*3/uL (ref 0.00–0.07)
Basophils Absolute: 0 10*3/uL (ref 0.0–0.1)
Basophils Relative: 4 %
Eosinophils Absolute: 0 10*3/uL (ref 0.0–0.5)
Eosinophils Relative: 4 %
HCT: 30.5 % — ABNORMAL LOW (ref 36.0–46.0)
Hemoglobin: 9.6 g/dL — ABNORMAL LOW (ref 12.0–15.0)
Immature Granulocytes: 0 %
Lymphocytes Relative: 23 %
Lymphs Abs: 0.2 10*3/uL — ABNORMAL LOW (ref 0.7–4.0)
MCH: 28.7 pg (ref 26.0–34.0)
MCHC: 31.5 g/dL (ref 30.0–36.0)
MCV: 91 fL (ref 80.0–100.0)
Monocytes Absolute: 0 10*3/uL — ABNORMAL LOW (ref 0.1–1.0)
Monocytes Relative: 4 %
Neutro Abs: 0.6 10*3/uL — ABNORMAL LOW (ref 1.7–7.7)
Neutrophils Relative %: 65 %
Platelets: 74 10*3/uL — ABNORMAL LOW (ref 150–400)
RBC: 3.35 MIL/uL — ABNORMAL LOW (ref 3.87–5.11)
RDW: 14.7 % (ref 11.5–15.5)
WBC: 1 10*3/uL — ABNORMAL LOW (ref 4.0–10.5)
nRBC: 0 % (ref 0.0–0.2)

## 2018-12-24 LAB — COMPREHENSIVE METABOLIC PANEL
ALT: 11 U/L (ref 0–44)
AST: 9 U/L — ABNORMAL LOW (ref 15–41)
Albumin: 3.2 g/dL — ABNORMAL LOW (ref 3.5–5.0)
Alkaline Phosphatase: 113 U/L (ref 38–126)
Anion gap: 11 (ref 5–15)
BUN: 13 mg/dL (ref 8–23)
CO2: 25 mmol/L (ref 22–32)
Calcium: 8 mg/dL — ABNORMAL LOW (ref 8.9–10.3)
Chloride: 107 mmol/L (ref 98–111)
Creatinine, Ser: 0.94 mg/dL (ref 0.44–1.00)
GFR calc Af Amer: >60 mL/min (ref >60)
GFR calc non Af Amer: >60 mL/min (ref >60)
Glucose, Bld: 121 mg/dL — ABNORMAL HIGH (ref 70–99)
Potassium: 3.6 mmol/L (ref 3.5–5.1)
Sodium: 143 mmol/L (ref 135–145)
Total Bilirubin: 1 mg/dL (ref 0.3–1.2)
Total Protein: 6 g/dL — ABNORMAL LOW (ref 6.5–8.1)

## 2018-12-24 MED ORDER — SODIUM CHLORIDE 0.9 % IV SOLN
INTRAVENOUS | Status: AC
  Administered 2018-12-24: 10:00:00 via INTRAVENOUS
  Filled 2018-12-24 (×2): qty 250

## 2018-12-24 MED ORDER — HEPARIN SOD (PORK) LOCK FLUSH 100 UNIT/ML IV SOLN
500.0000 [IU] | Freq: Once | INTRAVENOUS | Status: AC | PRN
  Administered 2018-12-24: 500 [IU]
  Filled 2018-12-24: qty 5

## 2018-12-24 MED ORDER — CIPROFLOXACIN HCL 500 MG PO TABS: 500.0000 mg | ORAL_TABLET | Freq: Two times a day (BID) | ORAL | 0 refills | Status: DC

## 2018-12-24 MED ORDER — ONDANSETRON HCL 4 MG/2ML IJ SOLN
8.0000 mg | Freq: Once | INTRAMUSCULAR | Status: AC
  Administered 2018-12-24: 8 mg via INTRAVENOUS

## 2018-12-24 MED ORDER — ONDANSETRON HCL 4 MG/2ML IJ SOLN
INTRAMUSCULAR | Status: AC
  Filled 2018-12-24: qty 4

## 2018-12-24 MED ORDER — SODIUM CHLORIDE 0.9% FLUSH
10.0000 mL | INTRAVENOUS | Status: DC | PRN
  Administered 2018-12-24: 10 mL
  Filled 2018-12-24: qty 10

## 2018-12-24 NOTE — Patient Instructions (Signed)
 Dehydration, Adult  Dehydration is when there is not enough fluid or water in your body. This happens when you lose more fluids than you take in. Dehydration can range from mild to very bad. It should be treated right away to keep it from getting very bad. Symptoms of mild dehydration may include:  Thirst.  Dry lips.  Slightly dry mouth.  Dry, warm skin.  Dizziness. Symptoms of moderate dehydration may include:  Very dry mouth.  Muscle cramps.  Dark pee (urine). Pee may be the color of tea.  Your body making less pee.  Your eyes making fewer tears.  Heartbeat that is uneven or faster than normal (palpitations).  Headache.  Light-headedness, especially when you stand up from sitting.  Fainting (syncope). Symptoms of very bad dehydration may include:  Changes in skin, such as: ? Cold and clammy skin. ? Blotchy (mottled) or pale skin. ? Skin that does not quickly return to normal after being lightly pinched and let go (poor skin turgor).  Changes in body fluids, such as: ? Feeling very thirsty. ? Your eyes making fewer tears. ? Not sweating when body temperature is high, such as in hot weather. ? Your body making very little pee.  Changes in vital signs, such as: ? Weak pulse. ? Pulse that is more than 100 beats a minute when you are sitting still. ? Fast breathing. ? Low blood pressure.  Other changes, such as: ? Sunken eyes. ? Cold hands and feet. ? Confusion. ? Lack of energy (lethargy). ? Trouble waking up from sleep. ? Short-term weight loss. ? Unconsciousness. Follow these instructions at home:   If told by your doctor, drink an ORS: ? Make an ORS by using instructions on the package. ? Start by drinking small amounts, about  cup (120 mL) every 5-10 minutes. ? Slowly drink more until you have had the amount that your doctor said to have.  Drink enough clear fluid to keep your pee clear or pale yellow. If you were told to drink an ORS, finish  the ORS first, then start slowly drinking clear fluids. Drink fluids such as: ? Water. Do not drink only water by itself. Doing that can make the salt (sodium) level in your body get too low (hyponatremia). ? Ice chips. ? Fruit juice that you have added water to (diluted). ? Low-calorie sports drinks.  Avoid: ? Alcohol. ? Drinks that have a lot of sugar. These include high-calorie sports drinks, fruit juice that does not have water added, and soda. ? Caffeine. ? Foods that are greasy or have a lot of fat or sugar.  Take over-the-counter and prescription medicines only as told by your doctor.  Do not take salt tablets. Doing that can make the salt level in your body get too high (hypernatremia).  Eat foods that have minerals (electrolytes). Examples include bananas, oranges, potatoes, tomatoes, and spinach.  Keep all follow-up visits as told by your doctor. This is important. Contact a doctor if:  You have belly (abdominal) pain that: ? Gets worse. ? Stays in one area (localizes).  You have a rash.  You have a stiff neck.  You get angry or annoyed more easily than normal (irritability).  You are more sleepy than normal.  You have a harder time waking up than normal.  You feel: ? Weak. ? Dizzy. ? Very thirsty.  You have peed (urinated) only a small amount of very dark pee during 6-8 hours. Get help right away if:  You   have symptoms of very bad dehydration.  You cannot drink fluids without throwing up (vomiting).  Your symptoms get worse with treatment.  You have a fever.  You have a very bad headache.  You are throwing up or having watery poop (diarrhea) and it: ? Gets worse. ? Does not go away.  You have blood or something green (bile) in your throw-up.  You have blood in your poop (stool). This may cause poop to look black and tarry.  You have not peed in 6-8 hours.  You pass out (faint).  Your heart rate when you are sitting still is more than 100  beats a minute.  You have trouble breathing. This information is not intended to replace advice given to you by your health care provider. Make sure you discuss any questions you have with your health care provider. Document Released: 04/22/2009 Document Revised: 01/14/2016 Document Reviewed: 08/20/2015 Elsevier Interactive Patient Education  2019 [employer]  Coronavirus (COVID-19) Are you at risk?  Are you at risk for the Coronavirus (COVID-19)?  To be considered HIGH RISK for Coronavirus (COVID-19), you have to meet the following criteria:  . Traveled to [place], [place], [place], [place] or [place]; or in the [place] to [place], [place], [place], or [place]; and have fever, cough, and shortness of breath within the last 2 weeks of travel OR . Been in close contact with a person diagnosed with COVID-19 within the last 2 weeks and have fever, cough, and shortness of breath . IF YOU DO NOT MEET THESE CRITERIA, YOU ARE CONSIDERED LOW RISK FOR COVID-19.  What to do if you are HIGH RISK for COVID-19?  . If you are having a medical emergency, call 911. . Seek medical care right away. Before you go to a doctor's office, urgent care or emergency department, call ahead and tell them about your recent travel, contact with someone diagnosed with COVID-19, and your symptoms. You should receive instructions from your physician's office regarding next steps of care.  . When you arrive at healthcare provider, tell the healthcare staff immediately you have returned from visiting [place], [place], [place], [place] or [place]; or traveled in the [place] to [place], [place], [place], or [place]; in the last two weeks or you have been in close contact with a person diagnosed with COVID-19 in the last 2 weeks.   . Tell the health care staff about your symptoms: fever, cough and shortness of breath. . After you have been seen by a medical provider, you will be either: o Tested for  (COVID-19) and discharged home on quarantine except to seek medical care if symptoms worsen, and asked to  - Stay home and avoid contact with others until you get your results (4-5 days)  - Avoid travel on public transportation if possible (such as bus, train, or airplane) or o Sent to the Emergency Department by EMS for evaluation, COVID-19 testing, and possible admission depending on your condition and test results.  What to do if you are LOW RISK for COVID-19?  Reduce your risk of any infection by using the same precautions used for avoiding the common cold or flu:  . Wash your hands often with soap and warm water for at least 20 seconds.  If soap and water are not readily available, use an alcohol-based hand sanitizer with at least 60% alcohol.  . If coughing or sneezing, cover your mouth and nose by coughing or sneezing into the elbow areas   of your shirt or coat, into a tissue or into your sleeve (not your hands). . Avoid shaking hands with others and consider head nods or verbal greetings only. . Avoid touching your eyes, nose, or mouth with unwashed hands.  . Avoid close contact with people who are sick. . Avoid places or events with large numbers of people in one location, like concerts or sporting events. . Carefully consider travel plans you have or are making. . If you are planning any travel outside or inside the [place], visit the CDC's Travelers' Health webpage for the latest health notices. . If you have some symptoms but not all symptoms, continue to monitor at home and seek medical attention if your symptoms worsen. . If you are having a medical emergency, call 911.   ADDITIONAL HEALTHCARE OPTIONS FOR PATIENTS  [facility] / e-Visit: [email or web address]         [facility]: [phone]  [facility]: [phone]                   [facility]: [phone]  

## 2018-12-24 NOTE — Telephone Encounter (Signed)
This RN returned call from pt per her VM stating " I just left there and need someone to call me ".  [name] stated " I don't know what ya'll gave me in my IV but it's got me all messed up "  [name] states she had episode upon leaving of incontinent stool x 1- and then she had to have help getting into her home.  She states she is presently feeling the same as earlier in the day and does not see an improvement after receiving the IV fluids.  This RN informed [name] of administration of NS 1 Liter and Zofran- these are drugs / fluids used previously.  Note pt had episode last week of incontinent stool but she states it was not " immediately after I left like this ".  [name] stated how above makes her " want to just run away and hide ".  This RN validated her feelings and concerns.  Per discussion [name] states she does not want to stop the chemo " because you cannot guarantee me that it would come back and then be twice as bad "  This RN again gave verbal support to patient with understanding that goal is to take one day at time and deal with issues at hand. She understands to call if needed even if it is to talk about her concerns to process and get thru her therapy.  No further needs at this time.  MD made aware of above.

## 2018-12-24 NOTE — Progress Notes (Signed)
[place]  [phone] [phone]     ID: Doris Smith DOB: 11/26/1950  MR#: [id number]  QPR#:[id number]  Patient Care Team: Chevis Pretty, FNP as PCP - General (Nurse Practitioner) Alphonsa Overall, MD as Consulting Physician (General Surgery) Magrinat, Virgie Dad, MD as Consulting Physician (Oncology) Kyung Rudd, MD as Consulting Physician (Radiation Oncology) Elijio Miles, MD as Consulting Physician (Pulmonary Disease) Netta Cedars, MD as Consulting Physician (Orthopedic Surgery) Susa Day, MD as Consulting Physician (Orthopedic Surgery) Janeth Rase, NP as Nurse Practitioner (Adult Health Nurse Practitioner) Mauro Kaufmann, RN as Oncology Nurse Navigator Rockwell Germany, RN as Oncology Nurse Navigator Irene Limbo, MD as Consulting Physician (Plastic Surgery) Scot Dock, NP OTHER MD: Chapman Moss, psychiatry   CHIEF COMPLAINT: Estrogen receptor positive breast cancer  CURRENT TREATMENT: Adjuvant chemotherapy   INTERVAL HISTORY: Doris Smith is seen today for follow-up and treatment of her estrogen receptor positive breast cancer.  She continues on cyclophosphamide, methotrexate, fluorouracil (CMF), to be repeated every 21 days x 8. Today is day 8 cycle 3.   REVIEW OF SYSTEMS: Doris Smith is not feeling well today.  She is exhausted.  She is requiring rest frequently throughout the day. She has noted some mouth and throat soreness that started 3 days ago.  She notes she has had a progressive cough that started a week ago, and has worsened.  She says her shortness of breath is slightly worse than her baseline.  She notes she had to start wearing depends after her last chemo.  She said she had a soft BM and she couldn't wait until she went to the bathroom, as if she lost control of her bowels.  This has happened twice since her last treatment.  This happened when she came home from chemo, and since then she has been  wearing depends at night.  She has had some nausea and vomiting as well.  She does not take any compazine or anti nausea medication.  She says that she will vomit about 2-3 times a day.  She notes that she has had some dizziness that has worsened since initiating chemotherapy, she notes she always felt a little dizzy, but now it is worse.  She notes that it is worse since the most recent treatment.   Doris Smith is taking Lorazepam at least once a day for her panicky feeling, and nausea.    Doris Smith denies fever or chills.  She is unsure if the IV fluids helped last week, but notes that she has difficulty with taking in fluids.  Doris Smith talked with Dereck Leep, PA-C in radiation oncology, she will undergo CT simulation tomorrow for her radiation.     HISTORY OF CURRENT ILLNESS: "Doris Smith" presented to her PCP, Dr. Hassell Done, with right breast pain, fullness, and nipple inversion since November 2019. She proceeded to undergo bilateral diagnostic mammography with tomography and right breast ultrasonography at [facility] on 07/31/2018 showing: findings compatible with multicentric breast cancer spanning at least the upper inner and upper outer quadrants of the right breast; normal right axilla. Palpable firmness of approximately 1.5 cm was noted in the 9 o'clock position, which was also seen on ultrasound with indisctinct margins measuring 2.7 x 2.3 x 2.2 cm. At 1 o'clock, a mass with poorly defined margins measures 1.9 x 1 x 0.8 cm. Additional poorly defined masses were seen in the 12 o'clock retroareolar region.  Accordingly on 08/02/2018 she proceeded to biopsy of the right breast area in question. The pathology [id number])  from this procedure showed: invasive mammary carcinoma at 9 o'clock and 1 o'clock; e-cadherin negative, consistent with lobular phenotype; grade 2-3. Prognostic indicators significant for: estrogen receptor, 90% positive and progesterone receptor, 1% positive, both with strong staining intensity.  Proliferation marker Ki67 at 1%. HER2 equivocal by immunohistochemistry, 2+ but negative by fluorescent in situ hybridization with a signals ratio 1.04 and number per cell 1.2.   The patient's subsequent history is as detailed below.   PAST MEDICAL HISTORY: Past Medical History:  Diagnosis Date   A-fib ([place]) 11/2012   PAF   Anxiety    takes Ativan   Asthma 04/07/2011   dx   Bipolar disorder ([place])    Cancer ([place])    Right breast   Depression    Dyspnea    DOE   Early cataracts, bilateral    Fatty liver    Fibromyalgia    Fracture, ankle 10/2008   right and left   GERD (gastroesophageal reflux disease)    Irritable bowel syndrome    Mental disorder    dx bipolar   PONV (postoperative nausea and vomiting)    Sleep apnea 04/2011    does not wear CPAP   Wears glasses   She has chronic sinus headaches, hiatal hernia   PAST SURGICAL HISTORY: Past Surgical History:  Procedure Laterality Date   ABDOMINAL HYSTERECTOMY     COLONOSCOPY     DILATION AND CURETTAGE OF UTERUS  1981   abnormal pap   KNEE ARTHROSCOPY Left 2007   x 2   LUMBAR LAMINECTOMY/DECOMPRESSION MICRODISCECTOMY  05/31/2011   Procedure: LUMBAR LAMINECTOMY/DECOMPRESSION MICRODISCECTOMY;  Surgeon: Johnn Hai;  Location: [facility];  Service: Orthopedics;  Laterality: Left;  Decompression Lumbar four to five and  Lumbar five to Sacral one on Left  (X-Ray)   MASTECTOMY W/ SENTINEL NODE BIOPSY Right 09/16/2018   Procedure: RIGHT MASTECTOMY WITH RIGHT AXILLARY SENTINEL LYMPH NODE BIOPSY;  Surgeon: Alphonsa Overall, MD;  Location: [facility];  Service: General;  Laterality: Right;   PARTIAL HYSTERECTOMY  1982   PORTACATH PLACEMENT Left 10/31/2018   Procedure: INSERTION PORT-A-CATH WITH ULTRASOUND;  Surgeon: Alphonsa Overall, MD;  Location: [facility];  Service: General;  Laterality: Left;   TONSILLECTOMY     as child   TOTAL KNEE ARTHROPLASTY Left 12/18/2005    FAMILY HISTORY Family  History  Problem Relation Age of Onset   Anesthesia problems Mother    Heart disease Mother        CHF, atrial fib   Heart failure Mother    Anesthesia problems Sister    Colon polyps Father    Heart disease Father        "Fluid around the heart"   Hypertension Sister    Breast cancer Maternal Aunt    Colon cancer Maternal Aunt    Prostate cancer Neg Hx    Ovarian cancer Neg Hx    As of February 2019, patient father is alive at 82 years old. Patient mother is alive at 39 years old. She notes a family hx of breast cancer. A maternal aunt was diagnosed with breast cancer, but Doris Smith is unsure if it was in one or both breasts. She has 3 siblings, 3 sisters and 0 brothers.   GYNECOLOGIC HISTORY:  No LMP recorded. Patient is postmenopausal. Menarche: 68 years old Age at first live birth: 68 years old [place] P 2 LMP about 42 years ago Contraceptive no HRT no  Hysterectomy? partial So? no   SOCIAL  HISTORY: Doris Smith worked in [employer] for 15 years. She is on disability because her back, knees, and nerves. Her husband, [name], has been at [employer] 43 years as a Freight forwarder.  Even though their home is in [place] they are actually living in [place] in an apartment while her husband works there.  Son Legrand Como, age 35, works as a Chief Strategy Officer in [place], [place]. Son Shanon Brow, age 33, works as a Building control surveyor in [place], [place]. They have 4 grandchildren, 2 great-grandchildren with 2 more on the way. She attends a [employer].     ADVANCED DIRECTIVES:    HEALTH MAINTENANCE: Social History   Tobacco Use   Smoking status: Never Smoker   Smokeless tobacco: Never Used  Substance Use Topics   Alcohol use: No   Drug use: No     Colonoscopy: 2019  PAP: 11/03/2014, normal  Bone density: 10/13/2016, T-score of -2.1   Allergies  Allergen Reactions   Iohexol     "over 20 years ago" had reaction "hurting in arm and heart"-Done in [place], [place].Marland Kitchenphysician stopped CT at that time.    Codeine Other (See Comments)    Makes feel like she is wild    Prednisone Other (See Comments)    Makes me very irritable   Sulfonamide Derivatives Other (See Comments)    Upset stomach   Celecoxib Nausea And Vomiting   Sulfa Antibiotics Nausea And Vomiting   Vitamin B12 Rash    Current Outpatient Medications  Medication Sig Dispense Refill   anastrozole (ARIMIDEX) 1 MG tablet      benzonatate (TESSALON PERLES) 100 MG capsule Take 1 capsule (100 mg total) by mouth 3 (three) times daily as needed for cough. 20 capsule 0   budesonide-formoterol (SYMBICORT) 160-4.5 MCG/ACT inhaler Inhale 2 puffs into the lungs 2 (two) times daily.     clotrimazole-betamethasone (LOTRISONE) cream Apply 1 application topically 2 (two) times daily. (Patient taking differently: Apply 1 application topically 2 (two) times daily as needed (rash). ) 30 g 0   dexamethasone (DECADRON) 4 MG tablet Take 1 tablet (4 mg total) by mouth daily. Start the day after chemotherapy for 2 days. Take with food. 30 tablet 1   esomeprazole (NEXIUM) 40 MG capsule TAKE 1 CAPSULE BY MOUTH ONCE DAILY 30 MINUTES BEFORE BREAKFAST 30 capsule 0   fluticasone (FLONASE) 50 MCG/ACT nasal spray Place 2 sprays into both nostrils daily. 16 g 6   fluticasone furoate-vilanterol (BREO ELLIPTA) 200-25 MCG/INH AEPB Inhale 1 puff into the lungs daily.     gabapentin (NEURONTIN) 300 MG capsule Take 1 capsule (300 mg total) by mouth 3 (three) times daily. (Patient taking differently: Take 300 mg by mouth 2 (two) times daily. ) 90 capsule 5   ibuprofen (ADVIL) 200 MG tablet Take 200 mg by mouth every 6 (six) hours as needed.     lamoTRIgine (LAMICTAL) 100 MG tablet Take 100 mg by mouth 2 (two) times daily.      lidocaine-prilocaine (EMLA) cream Apply to affected area once 30 g 3   LORazepam (ATIVAN) 0.5 MG tablet Take 1 tablet (0.5 mg total) by mouth at bedtime as needed (Nausea or vomiting). 30 tablet 0   montelukast (SINGULAIR) 10 MG  tablet Take 10 mg by mouth at bedtime.     prochlorperazine (COMPAZINE) 10 MG tablet Take 1 tablet (10 mg total) by mouth every 6 (six) hours as needed (Nausea or vomiting). 30 tablet 1   Vilazodone HCl (VIIBRYD) 40 MG TABS Take 40 mg  by mouth daily.     ciprofloxacin (CIPRO) 500 MG tablet Take 1 tablet (500 mg total) by mouth 2 (two) times daily. 14 tablet 0   No current facility-administered medications for this visit.    Facility-Administered Medications Ordered in Other Visits  Medication Dose Route Frequency Provider Last Rate Last Dose   0.9 %  sodium chloride infusion   Intravenous Continuous Gardenia Phlegm, NP 500 mL/hr at 12/24/18 0956     heparin lock flush 100 unit/mL  500 Units Intracatheter Once PRN Magrinat, Virgie Dad, MD       sodium chloride flush (NS) 0.9 % injection 10 mL  10 mL Intracatheter PRN Magrinat, Virgie Dad, MD        OBJECTIVE: Vitals:   12/24/18 0849  BP: (!) 112/58  Pulse: 97  Resp: 18  Temp: 97.8 F (36.6 C)  SpO2: 100%     Body mass index is 37.39 kg/m.   Wt Readings from Last 3 Encounters:  12/24/18 217 lb 12.8 oz (98.8 kg)  12/18/18 217 lb (98.4 kg)  12/17/18 217 lb 11.2 oz (98.7 kg)  ECOG FS:1 - Symptomatic but completely ambulatory GENERAL: Patient is a tired appearing female in no acute distress HEENT:  Sclerae anicteric.  Oropharynx clear and moist. No ulcerations or evidence of oropharyngeal candidiasis. Neck is supple.  NODES:  No cervical, supraclavicular, or axillary lymphadenopathy palpated.  BREAST EXAM:  Deferred. LUNGS:  Clear to auscultation bilaterally.  No wheezes or rhonchi. HEART:  Regular rate and rhythm. No murmur appreciated. ABDOMEN:  Soft, nontender.  Positive, normoactive bowel sounds. No organomegaly palpated. MSK:  No focal spinal tenderness to palpation. Full range of motion bilaterally in the upper extremities. EXTREMITIES:  No peripheral edema.   SKIN:  Clear with no obvious rashes or skin changes. No  nail dyscrasia. NEURO:  Nonfocal. Well oriented.  Anxious and tearful.     LAB RESULTS:  CMP     Component Value Date/Time   NA 143 12/24/2018 0825   NA 142 10/13/2016 1045   K 3.6 12/24/2018 0825   CL 107 12/24/2018 0825   CO2 25 12/24/2018 0825   GLUCOSE 121 (H) 12/24/2018 0825   BUN 13 12/24/2018 0825   BUN 8 10/13/2016 1045   CREATININE 0.94 12/24/2018 0825   CREATININE 0.90 12/19/2018 1434   CALCIUM 8.0 (L) 12/24/2018 0825   PROT 6.0 (L) 12/24/2018 0825   PROT 6.3 10/13/2016 1045   ALBUMIN 3.2 (L) 12/24/2018 0825   ALBUMIN 3.9 10/13/2016 1045   AST 9 (L) 12/24/2018 0825   AST 14 (L) 08/14/2018 0758   ALT 11 12/24/2018 0825   ALT 10 08/14/2018 0758   ALKPHOS 113 12/24/2018 0825   BILITOT 1.0 12/24/2018 0825   BILITOT 0.5 08/14/2018 0758   GFRNONAA >60 12/24/2018 0825   GFRNONAA >60 12/19/2018 1434   GFRAA >60 12/24/2018 0825   GFRAA >60 12/19/2018 1434    No results found for: TOTALPROTELP, ALBUMINELP, A1GS, A2GS, BETS, BETA2SER, GAMS, MSPIKE, SPEI  No results found for: KPAFRELGTCHN, LAMBDASER, KAPLAMBRATIO  Lab Results  Component Value Date   WBC 1.0 (L) 12/24/2018   NEUTROABS 0.6 (L) 12/24/2018   HGB 9.6 (L) 12/24/2018   HCT 30.5 (L) 12/24/2018   MCV 91.0 12/24/2018   PLT 74 (L) 12/24/2018    '@LASTCHEMISTRY' @  No results found for: LABCA2  No components found for: VEHMCN470  No results for input(s): INR in the last 168 hours.  No results found for: LABCA2  No results found for: KGU542  No results found for: HCW237  No results found for: SEG315  No results found for: CA2729  No components found for: HGQUANT  No results found for: CEA1 / No results found for: CEA1   No results found for: AFPTUMOR  No results found for: CHROMOGRNA  No results found for: PSA1  Appointment on 12/24/2018  Component Date Value Ref Range Status   Sodium 12/24/2018 143  135 - 145 mmol/L Final   Potassium 12/24/2018 3.6  3.5 - 5.1 mmol/L Final    Chloride 12/24/2018 107  98 - 111 mmol/L Final   CO2 12/24/2018 25  22 - 32 mmol/L Final   Glucose, Bld 12/24/2018 121* 70 - 99 mg/dL Final   BUN 12/24/2018 13  8 - 23 mg/dL Final   Creatinine, Ser 12/24/2018 0.94  0.44 - 1.00 mg/dL Final   Calcium 12/24/2018 8.0* 8.9 - 10.3 mg/dL Final   Total Protein 12/24/2018 6.0* 6.5 - 8.1 g/dL Final   Albumin 12/24/2018 3.2* 3.5 - 5.0 g/dL Final   AST 12/24/2018 9* 15 - 41 U/L Final   ALT 12/24/2018 11  0 - 44 U/L Final   Alkaline Phosphatase 12/24/2018 113  38 - 126 U/L Final   Total Bilirubin 12/24/2018 1.0  0.3 - 1.2 mg/dL Final   GFR calc non Af Amer 12/24/2018 >60  >60 mL/min Final   GFR calc Af Amer 12/24/2018 >60  >60 mL/min Final   Anion gap 12/24/2018 11  5 - 15 Final   Performed at [facility], [place] [address] [place], [place] [address]   WBC 12/24/2018 1.0* 4.0 - 10.5 K/uL Final   RBC 12/24/2018 3.35* 3.87 - 5.11 MIL/uL Final   Hemoglobin 12/24/2018 9.6* 12.0 - 15.0 g/dL Final   HCT 12/24/2018 30.5* 36.0 - 46.0 % Final   MCV 12/24/2018 91.0  80.0 - 100.0 fL Final   MCH 12/24/2018 28.7  26.0 - 34.0 pg Final   MCHC 12/24/2018 31.5  30.0 - 36.0 g/dL Final   RDW 12/24/2018 14.7  11.5 - 15.5 % Final   Platelets 12/24/2018 74* 150 - 400 K/uL Final   nRBC 12/24/2018 0.0  0.0 - 0.2 % Final   Neutrophils Relative % 12/24/2018 65  % Final   Neutro Abs 12/24/2018 0.6* 1.7 - 7.7 K/uL Final   Lymphocytes Relative 12/24/2018 23  % Final   Lymphs Abs 12/24/2018 0.2* 0.7 - 4.0 K/uL Final   Monocytes Relative 12/24/2018 4  % Final   Monocytes Absolute 12/24/2018 0.0* 0.1 - 1.0 K/uL Final   Eosinophils Relative 12/24/2018 4  % Final   Eosinophils Absolute 12/24/2018 0.0  0.0 - 0.5 K/uL Final   Basophils Relative 12/24/2018 4  % Final   Basophils Absolute 12/24/2018 0.0  0.0 - 0.1 K/uL Final   Smear Review 12/24/2018 OCC VARIANT LYMPH 1% NRBC   Final   Immature Granulocytes 12/24/2018 0   % Final   Abs Immature Granulocytes 12/24/2018 0.00  0.00 - 0.07 K/uL Final   Performed at [facility], [place] [address] [place], [place] [address]    (this displays the last labs from the last 3 days)  No results found for: TOTALPROTELP, ALBUMINELP, A1GS, A2GS, BETS, BETA2SER, GAMS, MSPIKE, SPEI (this displays SPEP labs)  No results found for: KPAFRELGTCHN, LAMBDASER, KAPLAMBRATIO (kappa/lambda light chains)  No results found for: HGBA, HGBA2QUANT, HGBFQUANT, HGBSQUAN (Hemoglobinopathy evaluation)   No results found for: LDH  No  results found for: IRON, TIBC, IRONPCTSAT (Iron and TIBC)  No results found for: FERRITIN  Urinalysis    Component Value Date/Time   COLORURINE YELLOW 12/19/2018 1355   APPEARANCEUR HAZY (A) 12/19/2018 1355   APPEARANCEUR Clear 05/03/2018 1204   LABSPEC 1.016 12/19/2018 1355   PHURINE 5.0 12/19/2018 1355   GLUCOSEU NEGATIVE 12/19/2018 1355   HGBUR NEGATIVE 12/19/2018 [place] 12/19/2018 1355   BILIRUBINUR Negative 05/03/2018 1204   KETONESUR NEGATIVE 12/19/2018 1355   PROTEINUR NEGATIVE 12/19/2018 1355   UROBILINOGEN negative 11/03/2014 1039   UROBILINOGEN 0.2 05/25/2011 0923   NITRITE NEGATIVE 12/19/2018 1355   LEUKOCYTESUR SMALL (A) 12/19/2018 1355     STUDIES: No results found.  ELIGIBLE FOR AVAILABLE RESEARCH PROTOCOL: no   ASSESSMENT: 68 y.o. [place], [place] woman status post right breast biopsy 08/02/2018 for a clinical T3 N0, stage IIA invasive lobular carcinoma, grade 2, estrogen receptor strongly positive, progesterone receptor 1% positive, with no HER-2 amplification and an MIB-1 of 1%.  (a) CT scan of the head and chest, without contrast 08/23/2018 showed nonspecific 0.4 cm left lower lobe pulmonary nodule, no definitive metastatic disease  (b) bone scan 08/23/2018 shows multiple spinal areas of abnormal uptake, but  (c) total spinal MRI 09/03/2018 finds bone scan findings to be secondary  to degenerative disease, no evidence of metastatic disease.  (1) status post right mastectomy and sentinel lymph node sampling 09/16/2018 showing a pT3 pN1(mic), stage IIA-IIIA invasive lobular breast cancer, grade 2, with 1 of 5 sampled lymph nodes involved by micrometastatic deposit, with extra nodular extension; ample margins  (2)  MammaPrint "high risk" suggests a 5-year metastasis free survival of 93% with chemotherapy, with an absolute chemotherapy benefit in the greater than 12% range  (3) adjuvant radiation to follow (can be done concurrently with chemotherapy)  (4) anastrozole started neoadjuvantly (on 08/14/2018), discontinued 10/14/2018 in preparation for chemotherapy  (5) Caris requested 10/14/2018  (6) adjuvant chemotherapy consisting of cyclophosphamide, methotrexate, fluorouracil (CMF) started 11/05/2018, to be repeated every 21 days x 8   PLAN: Doris Smith is having a very difficult time with chemotherapy.  After a long discussion with her, a lot of these issues, have been related to her anxiety and uncertainty surrounding her diagnosis, her treatments, and plan.  She is very motivated to continue receiving chemotherapy, as she believes that the benefits of reducing her cancer recurrence risk, outweigh all of these side effects that she is experiencing. She did receive IV fluids last week on days 2 and 3 after chemotherapy.  She will receive IV fluids again today.  I added IV Zofran to her list.    For her nausea I wrote out how to schedule her antiemetics for her future chemotherapy treatments.  Hopefully this will help with her nausea and decreased fluid intake.    Doris Smith is neutropenic today.  I sent in Cipro for her to take BID.  I also reviewed neutropenic precautions with her in detail.    She has no mucositis, or erythema of the mouth or throat. I recommended she rinse with salt water rinses and gargles.  She does have COPD and uses several inhalers.  She has no signs of thrush.     Doris Smith will return in 2 weeks for labs, f/u, and her next chemotherapy.  She knows to call for any questions or concerns that may arise prior to her next appointment with [place].    A total of (30) minutes of face-to-face time was spent with  this patient with greater than 50% of that time in counseling and care-coordination.    Wilber Bihari, NP  12/24/18 11:38 AM Medical Oncology and Hematology [facility] [address] [place], [place] [address] Tel. [phone]    Fax. [phone]

## 2018-12-24 NOTE — Patient Instructions (Signed)
Take Compazine every 6 hours starting the evening after chemotherapy and take for 3-5 days following chemotherapy.  You are neutropenic.  If you have any fever, or chills, please contact us immediately.  If you cannot get in touch with [place], please go to the ER.

## 2018-12-25 ENCOUNTER — Ambulatory Visit: Payer: BC Managed Care – PPO | Admitting: Family Medicine

## 2018-12-25 ENCOUNTER — Other Ambulatory Visit: Payer: Self-pay

## 2018-12-25 ENCOUNTER — Encounter: Payer: Self-pay | Admitting: *Deleted

## 2018-12-25 ENCOUNTER — Ambulatory Visit
Admission: RE | Admit: 2018-12-25 | Discharge: 2018-12-25 | Disposition: A | Discharge status: Home / Self Care | Payer: BC Managed Care – PPO | Source: Ambulatory Visit | Attending: Radiation Oncology | Admitting: Radiation Oncology

## 2018-12-25 ENCOUNTER — Encounter: Payer: Self-pay | Admitting: Family Medicine

## 2018-12-25 VITALS — BP 120/61 | HR 68 | Temp 97.7°F | Ht 64.0 in | Wt 218.2 lb

## 2018-12-25 DIAGNOSIS — C50911 Malignant neoplasm of unspecified site of right female breast: Secondary | ICD-10-CM | POA: Diagnosis not present

## 2018-12-25 DIAGNOSIS — G8929 Other chronic pain: Secondary | ICD-10-CM

## 2018-12-25 DIAGNOSIS — Z170 Estrogen receptor positive status [ER+]: Secondary | ICD-10-CM | POA: Insufficient documentation

## 2018-12-25 DIAGNOSIS — C50411 Malignant neoplasm of upper-outer quadrant of right female breast: Secondary | ICD-10-CM | POA: Diagnosis not present

## 2018-12-25 DIAGNOSIS — Z510 Encounter for antineoplastic radiation therapy: Secondary | ICD-10-CM | POA: Insufficient documentation

## 2018-12-25 DIAGNOSIS — M25561 Pain in right knee: Secondary | ICD-10-CM

## 2018-12-25 NOTE — Progress Notes (Signed)
Chief Complaint  Patient presents with  . right knee pain    requesting injection    HPI  Patient presents today for severe pain in the right knee.  She had left knee replacement 13 years ago.  She had multiple shots in that knee until he quit helping.  She has not had a shot in the right knee but it feels like the left knee prior to surgery did.  Currently she is going through breast cancer treatment.  She is having cycles of chemo every 3 weeks.  Her most recent oncology report was reviewed..  She finished up a cycle recently and her next cycle she says starts in 2 weeks.  She says she just has to keep going and she cannot put up with the knee as it is.  She is able to ambulate but with a limp due to pain.  PMH: Smoking status noted ROS: Per HPI  Objective: BP 120/61   Pulse 68   Temp 97.7 F (36.5 C) (Oral)   Ht 5\' 4"  (1.626 m)   Wt 218 lb 3.2 oz (99 kg)   BMI 37.45 kg/m  Gen: NAD, alert, cooperative with exam.  V.  Ery emotional today discussing chemo and cancer HEENT: NCAT, EOMI, PERRL CV: RRR, good S1/S2, no murmur Resp: CTABL, no wheezes, non-labored Ext: No edema, warm.  Soft tissue hypertrophy medially.  Moderately tender over the medial joint line of the right knee.  The area was prepped and draped in sterile fashion.  A 22-gauge 1-1/2 inch needle was introduced after local with ethyl chloride.  The needle was positioned in the joint space and injected with 2-1/2 mL of Marcaine with 1-1/2 mL of Celestone.  This was tolerated well no bleeding.  No complications. Neuro: Alert and oriented, No gross deficits  Assessment and plan:  1. Chronic pain of right knee   2. Breast cancer, stage 2, right (HCC)     No orders of the defined types were placed in this encounter.   No orders of the defined types were placed in this encounter.   Follow up as needed.  Claretta Fraise, MD

## 2018-12-26 ENCOUNTER — Telehealth: Payer: Self-pay

## 2018-12-26 ENCOUNTER — Other Ambulatory Visit: Payer: Self-pay

## 2018-12-26 MED ORDER — OMEPRAZOLE 20 MG PO CPDR: 20.0000 mg | DELAYED_RELEASE_CAPSULE | Freq: Every day | ORAL | 0 refills | Status: DC

## 2018-12-26 MED ORDER — MAGIC MOUTHWASH: 5.0000 mL | Freq: Four times a day (QID) | ORAL | 0 refills | Status: DC | PRN

## 2018-12-26 NOTE — Progress Notes (Signed)
  Radiation Oncology         [phone]) [phone] ________________________________  Name: Doris Smith MRN: [id number]  Date: 12/25/2018  DOB: [birth year]  Optical Surface Tracking Plan:  Since intensity modulated radiotherapy (IMRT) and 3D conformal radiation treatment methods are predicated on accurate and precise positioning for treatment, intrafraction motion monitoring is medically necessary to ensure accurate and safe treatment delivery.  The ability to quantify intrafraction motion without excessive ionizing radiation dose can only be performed with optical surface tracking. Accordingly, surface imaging offers the opportunity to obtain 3D measurements of patient position throughout IMRT and 3D treatments without excessive radiation exposure.  I am ordering optical surface tracking for this patient's upcoming course of radiotherapy. ________________________________  Kyung Rudd, MD 12/26/2018 5:14 PM    Reference:   Ursula Alert, J, et al. Surface imaging-based analysis of intrafraction motion for breast radiotherapy patients.Journal of [place], n. 6, nov. 2014. ISSN [id number].   Available at: <http://www.jacmp.org/index.php/jacmp/article/view/4957>.

## 2018-12-26 NOTE — Telephone Encounter (Signed)
Pt called stating that her mouth and throat are very sore - worse when she vomits - and feels the more she talks or swallows that her throat is trying to close up.  Per review of chart, pt saw [facility] on 6/16 and was advised on antiemetic schedule, salt water gargle / rinses, and started on antibiotic. Pt states she has been taking antibiotic as prescribed and antiemetics as advised.  She is not concerned about the emesis, just the mouth pain.  She has also been gargling with Listerine. Pt instructed to stop using Listerine for now, eat soft, bland, cool foods and liquids, avoid anything acidic or spicy. Per LC, prescriptions for Magic Mouthwash and Prilosec sent to pt's pharmacy.  Pt is aware and verbalizes understanding.

## 2018-12-26 NOTE — Progress Notes (Signed)
  Radiation Oncology         [id number]) [phone] ________________________________  Name: Doris Smith MRN: [id number]  Date: 12/25/2018  DOB: 12-03-1950  DIAGNOSIS:     ICD-10-CM   1. Malignant neoplasm of upper-outer quadrant of right breast in female, estrogen receptor positive ([place])  C50.411    Z17.0      SIMULATION AND TREATMENT PLANNING NOTE  The patient presented for simulation prior to beginning her course of radiation treatment for her diagnosis of right-sided breast cancer. The patient was placed in a supine position on a breast board. A customized vac-lock bag was also constructed and this complex treatment device will be used on a daily basis during her treatment. In this fashion, a CT scan was obtained through the chest area and an isocenter was placed near the chest wall at the upper aspect of the right chest.  The patient will be planned to receive a course of radiation initially to a dose of 50.4 gray. This will consist of a 4 field technique targeting the right chest wall as well as the supraclavicular region. Therefore 2 customized medial and lateral tangent fields have been created targeting the chest wall, and also 2 additional customized fields have been designed to treat the supraclavicular region both with a right supraclavicular field and a right posterior axillary boost field. A forward planning/reduced field technique will also be evaluated to determine if this significantly improves the dose homogeneity of the overall plan. Therefore, additional customized blocks/fields may be necessary.  This initial treatment will be accomplished at 1.8 gray per fraction.   The initial plan will consist of a 3-D conformal technique. The target volume/scar, heart and lungs have been contoured and dose volume histograms of each of these structures will be evaluated as part of the 3-D conformal treatment planning process.   It is anticipated that the patient will then receive a 10 gray  boost to the surgical scar. This will be accomplished at 2 gray per fraction. The final anticipated total dose therefore will correspond to 60.4 gray.    _______________________________   Jodelle Gross, MD, PhD

## 2018-12-27 ENCOUNTER — Telehealth: Payer: Self-pay | Admitting: *Deleted

## 2018-12-27 MED ORDER — MAGIC MOUTHWASH: 5.0000 mL | Freq: Four times a day (QID) | ORAL | 0 refills | Status: DC | PRN

## 2018-12-27 NOTE — Telephone Encounter (Signed)
This RN spoke with pt per her call stating need for MMW to be called to [place] " because it has to be compounded and [employer] doesn't do that "  She also states need for FMLA for her husband [name] .  They thought the surgeon's office had completed above but have recently found out forms were not sent back to her husband's employer.  Plan per call - MMW called to [employer].  Pt understands this office will managed FMLA- and will complete for her husband upon receiving.  No further needs at this time.

## 2018-12-29 DIAGNOSIS — Z510 Encounter for antineoplastic radiation therapy: Secondary | ICD-10-CM | POA: Diagnosis not present

## 2018-12-29 DIAGNOSIS — C50411 Malignant neoplasm of upper-outer quadrant of right female breast: Secondary | ICD-10-CM | POA: Diagnosis not present

## 2018-12-29 DIAGNOSIS — Z170 Estrogen receptor positive status [ER+]: Secondary | ICD-10-CM | POA: Diagnosis not present

## 2018-12-30 ENCOUNTER — Other Ambulatory Visit: Payer: Self-pay

## 2018-12-30 ENCOUNTER — Other Ambulatory Visit: Payer: Self-pay | Admitting: *Deleted

## 2018-12-30 ENCOUNTER — Ambulatory Visit
Admission: RE | Admit: 2018-12-30 | Discharge: 2018-12-30 | Disposition: A | Discharge status: Home / Self Care | Payer: BC Managed Care – PPO | Source: Ambulatory Visit | Attending: Radiation Oncology | Admitting: Radiation Oncology

## 2018-12-30 DIAGNOSIS — Z170 Estrogen receptor positive status [ER+]: Secondary | ICD-10-CM | POA: Diagnosis not present

## 2018-12-30 DIAGNOSIS — C50411 Malignant neoplasm of upper-outer quadrant of right female breast: Secondary | ICD-10-CM | POA: Diagnosis not present

## 2018-12-30 DIAGNOSIS — Z510 Encounter for antineoplastic radiation therapy: Secondary | ICD-10-CM | POA: Diagnosis not present

## 2018-12-31 ENCOUNTER — Ambulatory Visit
Admission: RE | Admit: 2018-12-31 | Discharge: 2018-12-31 | Disposition: A | Discharge status: Home / Self Care | Payer: BC Managed Care – PPO | Source: Ambulatory Visit | Attending: Radiation Oncology | Admitting: Radiation Oncology

## 2018-12-31 ENCOUNTER — Other Ambulatory Visit: Payer: Self-pay

## 2018-12-31 DIAGNOSIS — C50411 Malignant neoplasm of upper-outer quadrant of right female breast: Secondary | ICD-10-CM | POA: Diagnosis not present

## 2018-12-31 DIAGNOSIS — Z510 Encounter for antineoplastic radiation therapy: Secondary | ICD-10-CM | POA: Diagnosis not present

## 2018-12-31 DIAGNOSIS — Z170 Estrogen receptor positive status [ER+]: Secondary | ICD-10-CM | POA: Diagnosis not present

## 2019-01-01 ENCOUNTER — Telehealth: Payer: Self-pay

## 2019-01-01 ENCOUNTER — Ambulatory Visit
Admission: RE | Admit: 2019-01-01 | Discharge: 2019-01-01 | Disposition: A | Discharge status: Home / Self Care | Payer: BC Managed Care – PPO | Source: Ambulatory Visit | Attending: Radiation Oncology | Admitting: Radiation Oncology

## 2019-01-01 ENCOUNTER — Other Ambulatory Visit: Payer: Self-pay

## 2019-01-01 DIAGNOSIS — C50411 Malignant neoplasm of upper-outer quadrant of right female breast: Secondary | ICD-10-CM | POA: Diagnosis not present

## 2019-01-01 DIAGNOSIS — Z170 Estrogen receptor positive status [ER+]: Secondary | ICD-10-CM | POA: Diagnosis not present

## 2019-01-01 DIAGNOSIS — Z510 Encounter for antineoplastic radiation therapy: Secondary | ICD-10-CM | POA: Diagnosis not present

## 2019-01-01 NOTE — Telephone Encounter (Signed)
Attempted to contact pt regarding order for CXR that was supposed to be done yesterday.  No answer, no VM

## 2019-01-02 ENCOUNTER — Ambulatory Visit
Admission: RE | Admit: 2019-01-02 | Discharge: 2019-01-02 | Disposition: A | Discharge status: Home / Self Care | Payer: BC Managed Care – PPO | Source: Ambulatory Visit | Attending: Radiation Oncology | Admitting: Radiation Oncology

## 2019-01-02 ENCOUNTER — Other Ambulatory Visit: Payer: Self-pay

## 2019-01-02 DIAGNOSIS — Z510 Encounter for antineoplastic radiation therapy: Secondary | ICD-10-CM | POA: Diagnosis not present

## 2019-01-02 DIAGNOSIS — C50411 Malignant neoplasm of upper-outer quadrant of right female breast: Secondary | ICD-10-CM | POA: Diagnosis not present

## 2019-01-02 DIAGNOSIS — Z170 Estrogen receptor positive status [ER+]: Secondary | ICD-10-CM | POA: Diagnosis not present

## 2019-01-03 ENCOUNTER — Ambulatory Visit
Admission: RE | Admit: 2019-01-03 | Discharge: 2019-01-03 | Disposition: A | Discharge status: Home / Self Care | Payer: BC Managed Care – PPO | Source: Ambulatory Visit | Attending: Radiation Oncology | Admitting: Radiation Oncology

## 2019-01-03 DIAGNOSIS — C50411 Malignant neoplasm of upper-outer quadrant of right female breast: Secondary | ICD-10-CM | POA: Diagnosis not present

## 2019-01-03 DIAGNOSIS — Z170 Estrogen receptor positive status [ER+]: Secondary | ICD-10-CM | POA: Diagnosis not present

## 2019-01-03 DIAGNOSIS — Z510 Encounter for antineoplastic radiation therapy: Secondary | ICD-10-CM | POA: Diagnosis not present

## 2019-01-03 MED ORDER — ALRA NON-METALLIC DEODORANT (RAD-ONC)
1.0000 "application " | Freq: Once | TOPICAL | Status: AC
  Administered 2019-01-03: 1 via TOPICAL

## 2019-01-03 MED ORDER — RADIAPLEXRX EX GEL
Freq: Two times a day (BID) | CUTANEOUS | Status: DC
  Administered 2019-01-03: 14:00:00 via TOPICAL

## 2019-01-06 ENCOUNTER — Ambulatory Visit
Admission: RE | Admit: 2019-01-06 | Discharge: 2019-01-06 | Disposition: A | Discharge status: Home / Self Care | Payer: BC Managed Care – PPO | Source: Ambulatory Visit | Attending: Radiation Oncology | Admitting: Radiation Oncology

## 2019-01-06 ENCOUNTER — Other Ambulatory Visit: Payer: Self-pay

## 2019-01-06 DIAGNOSIS — Z510 Encounter for antineoplastic radiation therapy: Secondary | ICD-10-CM | POA: Diagnosis not present

## 2019-01-06 DIAGNOSIS — Z170 Estrogen receptor positive status [ER+]: Secondary | ICD-10-CM | POA: Diagnosis not present

## 2019-01-06 DIAGNOSIS — C50411 Malignant neoplasm of upper-outer quadrant of right female breast: Secondary | ICD-10-CM | POA: Diagnosis not present

## 2019-01-07 ENCOUNTER — Inpatient Hospital Stay: Payer: BC Managed Care – PPO

## 2019-01-07 ENCOUNTER — Ambulatory Visit
Admission: RE | Admit: 2019-01-07 | Discharge: 2019-01-07 | Disposition: A | Discharge status: Home / Self Care | Payer: BC Managed Care – PPO | Source: Ambulatory Visit | Attending: Radiation Oncology | Admitting: Radiation Oncology

## 2019-01-07 ENCOUNTER — Encounter: Payer: Self-pay | Admitting: *Deleted

## 2019-01-07 ENCOUNTER — Inpatient Hospital Stay (HOSPITAL_BASED_OUTPATIENT_CLINIC_OR_DEPARTMENT_OTHER): Payer: BC Managed Care – PPO | Admitting: Adult Health

## 2019-01-07 ENCOUNTER — Telehealth: Payer: Self-pay | Admitting: *Deleted

## 2019-01-07 ENCOUNTER — Other Ambulatory Visit: Payer: Self-pay

## 2019-01-07 ENCOUNTER — Encounter: Payer: Self-pay | Admitting: Adult Health

## 2019-01-07 VITALS — BP 106/51 | HR 74 | Temp 98.7°F | Resp 18 | Ht 64.0 in | Wt 212.3 lb

## 2019-01-07 DIAGNOSIS — C50411 Malignant neoplasm of upper-outer quadrant of right female breast: Secondary | ICD-10-CM

## 2019-01-07 DIAGNOSIS — Z170 Estrogen receptor positive status [ER+]: Secondary | ICD-10-CM

## 2019-01-07 DIAGNOSIS — C50211 Malignant neoplasm of upper-inner quadrant of right female breast: Secondary | ICD-10-CM | POA: Diagnosis not present

## 2019-01-07 DIAGNOSIS — R110 Nausea: Secondary | ICD-10-CM | POA: Diagnosis not present

## 2019-01-07 DIAGNOSIS — Z95828 Presence of other vascular implants and grafts: Secondary | ICD-10-CM

## 2019-01-07 DIAGNOSIS — R05 Cough: Secondary | ICD-10-CM | POA: Diagnosis not present

## 2019-01-07 DIAGNOSIS — J449 Chronic obstructive pulmonary disease, unspecified: Secondary | ICD-10-CM | POA: Diagnosis not present

## 2019-01-07 DIAGNOSIS — D701 Agranulocytosis secondary to cancer chemotherapy: Secondary | ICD-10-CM | POA: Diagnosis not present

## 2019-01-07 DIAGNOSIS — R530 Neoplastic (malignant) related fatigue: Secondary | ICD-10-CM | POA: Diagnosis not present

## 2019-01-07 DIAGNOSIS — Z9011 Acquired absence of right breast and nipple: Secondary | ICD-10-CM | POA: Diagnosis not present

## 2019-01-07 DIAGNOSIS — R031 Nonspecific low blood-pressure reading: Secondary | ICD-10-CM | POA: Diagnosis not present

## 2019-01-07 DIAGNOSIS — Z5111 Encounter for antineoplastic chemotherapy: Secondary | ICD-10-CM | POA: Diagnosis not present

## 2019-01-07 DIAGNOSIS — Z5189 Encounter for other specified aftercare: Secondary | ICD-10-CM | POA: Diagnosis not present

## 2019-01-07 DIAGNOSIS — Z510 Encounter for antineoplastic radiation therapy: Secondary | ICD-10-CM | POA: Diagnosis not present

## 2019-01-07 DIAGNOSIS — F419 Anxiety disorder, unspecified: Secondary | ICD-10-CM | POA: Diagnosis not present

## 2019-01-07 LAB — CBC WITH DIFFERENTIAL/PLATELET
Abs Immature Granulocytes: 0.05 10*3/uL (ref 0.00–0.07)
Basophils Absolute: 0.1 10*3/uL (ref 0.0–0.1)
Basophils Relative: 1 %
Eosinophils Absolute: 0.1 10*3/uL (ref 0.0–0.5)
Eosinophils Relative: 1 %
HCT: 32.9 % — ABNORMAL LOW (ref 36.0–46.0)
Hemoglobin: 10.4 g/dL — ABNORMAL LOW (ref 12.0–15.0)
Immature Granulocytes: 1 %
Lymphocytes Relative: 8 %
Lymphs Abs: 0.6 10*3/uL — ABNORMAL LOW (ref 0.7–4.0)
MCH: 28.8 pg (ref 26.0–34.0)
MCHC: 31.6 g/dL (ref 30.0–36.0)
MCV: 91.1 fL (ref 80.0–100.0)
Monocytes Absolute: 0.9 10*3/uL (ref 0.1–1.0)
Monocytes Relative: 14 %
Neutro Abs: 5.1 10*3/uL (ref 1.7–7.7)
Neutrophils Relative %: 75 %
Platelets: 412 10*3/uL — ABNORMAL HIGH (ref 150–400)
RBC: 3.61 MIL/uL — ABNORMAL LOW (ref 3.87–5.11)
RDW: 16.8 % — ABNORMAL HIGH (ref 11.5–15.5)
WBC: 6.8 10*3/uL (ref 4.0–10.5)
nRBC: 0.3 % — ABNORMAL HIGH (ref 0.0–0.2)

## 2019-01-07 LAB — COMPREHENSIVE METABOLIC PANEL
ALT: 12 U/L (ref 0–44)
AST: 13 U/L — ABNORMAL LOW (ref 15–41)
Albumin: 3.3 g/dL — ABNORMAL LOW (ref 3.5–5.0)
Alkaline Phosphatase: 103 U/L (ref 38–126)
Anion gap: 6 (ref 5–15)
BUN: 10 mg/dL (ref 8–23)
CO2: 28 mmol/L (ref 22–32)
Calcium: 8.4 mg/dL — ABNORMAL LOW (ref 8.9–10.3)
Chloride: 106 mmol/L (ref 98–111)
Creatinine, Ser: 0.85 mg/dL (ref 0.44–1.00)
GFR calc Af Amer: >60 mL/min (ref >60)
GFR calc non Af Amer: >60 mL/min (ref >60)
Glucose, Bld: 97 mg/dL (ref 70–99)
Potassium: 4.2 mmol/L (ref 3.5–5.1)
Sodium: 140 mmol/L (ref 135–145)
Total Bilirubin: 0.5 mg/dL (ref 0.3–1.2)
Total Protein: 6.8 g/dL (ref 6.5–8.1)

## 2019-01-07 MED ORDER — SODIUM CHLORIDE 0.9% FLUSH
10.0000 mL | INTRAVENOUS | Status: DC | PRN
  Administered 2019-01-07: 10 mL
  Filled 2019-01-07: qty 10

## 2019-01-07 MED ORDER — FLUOROURACIL CHEMO INJECTION 2.5 GM/50ML
600.0000 mg/m2 | Freq: Once | INTRAVENOUS | Status: AC
  Administered 2019-01-07: 12:00:00 1250 mg via INTRAVENOUS
  Filled 2019-01-07: qty 25

## 2019-01-07 MED ORDER — PALONOSETRON HCL INJECTION 0.25 MG/5ML
0.2500 mg | Freq: Once | INTRAVENOUS | Status: AC
  Administered 2019-01-07: 0.25 mg via INTRAVENOUS

## 2019-01-07 MED ORDER — SODIUM CHLORIDE 0.9 % IV SOLN
Freq: Once | INTRAVENOUS | Status: AC
  Administered 2019-01-07: 10:00:00 via INTRAVENOUS
  Filled 2019-01-07: qty 250

## 2019-01-07 MED ORDER — DEXAMETHASONE SODIUM PHOSPHATE 10 MG/ML IJ SOLN
10.0000 mg | Freq: Once | INTRAMUSCULAR | Status: AC
  Administered 2019-01-07: 10 mg via INTRAVENOUS

## 2019-01-07 MED ORDER — SODIUM CHLORIDE 0.9 % IV SOLN
INTRAVENOUS | Status: DC
  Administered 2019-01-07: 11:00:00 via INTRAVENOUS
  Filled 2019-01-07: qty 250

## 2019-01-07 MED ORDER — PALONOSETRON HCL INJECTION 0.25 MG/5ML
INTRAVENOUS | Status: AC
  Filled 2019-01-07: qty 5

## 2019-01-07 MED ORDER — HEPARIN SOD (PORK) LOCK FLUSH 100 UNIT/ML IV SOLN
500.0000 [IU] | Freq: Once | INTRAVENOUS | Status: AC | PRN
  Administered 2019-01-07: 500 [IU]
  Filled 2019-01-07: qty 5

## 2019-01-07 MED ORDER — DEXAMETHASONE SODIUM PHOSPHATE 10 MG/ML IJ SOLN
INTRAMUSCULAR | Status: AC
  Filled 2019-01-07: qty 1

## 2019-01-07 MED ORDER — SODIUM CHLORIDE 0.9 % IV SOLN
600.0000 mg/m2 | Freq: Once | INTRAVENOUS | Status: AC
  Administered 2019-01-07: 1260 mg via INTRAVENOUS
  Filled 2019-01-07: qty 63

## 2019-01-07 NOTE — Patient Instructions (Signed)

## 2019-01-07 NOTE — Patient Instructions (Signed)
[facility] Discharge Instructions for Patients Receiving Chemotherapy  Today you received the following chemotherapy agents Cytoxan,  5 FU  To help prevent nausea and vomiting after your treatment, we encourage you to take your nausea medication as directed  If you develop nausea and vomiting that is not controlled by your nausea medication, call the clinic.   BELOW ARE SYMPTOMS THAT SHOULD BE REPORTED IMMEDIATELY:  *FEVER GREATER THAN 100.5 F  *CHILLS WITH OR WITHOUT FEVER  NAUSEA AND VOMITING THAT IS NOT CONTROLLED WITH YOUR NAUSEA MEDICATION  *UNUSUAL SHORTNESS OF BREATH  *UNUSUAL BRUISING OR BLEEDING  TENDERNESS IN MOUTH AND THROAT WITH OR WITHOUT PRESENCE OF ULCERS  *URINARY PROBLEMS  *BOWEL PROBLEMS  UNUSUAL RASH Items with * indicate a potential emergency and should be followed up as soon as possible.  Feel free to call the clinic should you have any questions or concerns. The clinic phone number is [phone].  Please show the [facility] at check-in to the Emergency Department and triage nurse.

## 2019-01-07 NOTE — Telephone Encounter (Signed)
This RN spoke with pt per her call at 1300.  Doris Smith stated " well this morning didn't go well at all ".  Doris Smith stated situation while in treatment room where she had a coughing spell- and was given a mask to put on.  " I went off on them and didn't care who heard me "  Per discussion Doris Smith was very emotional with voice being raised in tone and then short episodes of crying.  Doris Smith verbalized frustration with " don't feel like no one is helping me and knows what I am going through "  " just tired of everything, why do I have to have surgery, why do I have to have chemotherapy, why do I have to do radiation "  " I wished I just wouldn't of even had the surgery to start with and let things be and have God take me "  " I am tired of people just telling me what to do "  This RN allowed pt to vent and state her feelings - with Doris Smith stating " I have always been a nervous person and now all of this has me all messed up "  Note during pt's statements of frustration towards staff and how she feels she is being treated - she would say " I know you are trying to help me ".  Per discussion - pt is not suicidal nor has any plans for self harm " just tired of it ".  Post 10 discussion- Doris Smith was more calm and able to state understanding of plan for next week - which is she will see MD post labs for further discussion and treatment plan. Doris Smith also stated understanding to call if she if feeling overwhelmed while processing a very difficult situation.

## 2019-01-08 ENCOUNTER — Ambulatory Visit: Payer: BC Managed Care – PPO

## 2019-01-08 NOTE — Progress Notes (Signed)
[place]  [phone] [phone]) [phone]     ID: Doris Smith DOB: 17-Oct-1950  MR#: [id number]  QIH#:[id number]  Patient Care Team: Chevis Pretty, FNP as PCP - General (Nurse Practitioner) Alphonsa Overall, MD as Consulting Physician (General Surgery) Magrinat, Virgie Dad, MD as Consulting Physician (Oncology) Kyung Rudd, MD as Consulting Physician (Radiation Oncology) Elijio Miles, MD as Consulting Physician (Pulmonary Disease) Netta Cedars, MD as Consulting Physician (Orthopedic Surgery) Susa Day, MD as Consulting Physician (Orthopedic Surgery) Janeth Rase, NP as Nurse Practitioner (Adult Health Nurse Practitioner) Mauro Kaufmann, RN as Oncology Nurse Navigator Rockwell Germany, RN as Oncology Nurse Navigator Irene Limbo, MD as Consulting Physician (Plastic Surgery) Scot Dock, NP OTHER MD: Chapman Moss, psychiatry   CHIEF COMPLAINT: Estrogen receptor positive breast cancer  CURRENT TREATMENT: Adjuvant chemotherapy   INTERVAL HISTORY: Doris Smith is seen today for follow-up and treatment of her estrogen receptor positive breast cancer.  She continues on cyclophosphamide, methotrexate, fluorouracil (CMF), to be repeated every 21 days x 8. Today is day 1 cycle 4.  We are holding the Methotrexate while she is undergoing adjuvant radiation.   REVIEW OF SYSTEMS: Doris Smith notes that she continues to feel poorly.  She started radiation a couple of weeks ago and she says that she does not like it and how she feels.  She says that it is distressing to lie in the mold and undergo it.  She says that after radiation she gets hot and nauseated.  She did not take any nausea medication for her nausea. She endorses fatigue.  She is unhappy with driving back and forth to radiation.   Doris Smith had previously received fluids for her decreased oral intake.  She isn't sure if it helped or not because she says she felt tired the entire  time.  She notes that her mouth still hurts, but she doesn't have any current noticeable oral lesions.  Doris Smith notes a continued dry non productive cough that is intermittent.  She has a h/o COPD.  She was supposed to have undergone chest xray last week.  She notes she hasn't felt like going to radiology for a chest xray.  Doris Smith denies any fever, chills, chest pain, palpitations.  She denies any vomiting,bowel/bladder issues.  A detailed ROS was otherwise non contributory.   HISTORY OF CURRENT ILLNESS: "Doris Smith" presented to her PCP, Dr. Hassell Done, with right breast pain, fullness, and nipple inversion since November 2019. She proceeded to undergo bilateral diagnostic mammography with tomography and right breast ultrasonography at [facility] on 07/31/2018 showing: findings compatible with multicentric breast cancer spanning at least the upper inner and upper outer quadrants of the right breast; normal right axilla. Palpable firmness of approximately 1.5 cm was noted in the 9 o'clock position, which was also seen on ultrasound with indisctinct margins measuring 2.7 x 2.3 x 2.2 cm. At 1 o'clock, a mass with poorly defined margins measures 1.9 x 1 x 0.8 cm. Additional poorly defined masses were seen in the 12 o'clock retroareolar region.  Accordingly on 08/02/2018 she proceeded to biopsy of the right breast area in question. The pathology ([id number]) from this procedure showed: invasive mammary carcinoma at 9 o'clock and 1 o'clock; e-cadherin negative, consistent with lobular phenotype; grade 2-3. Prognostic indicators significant for: estrogen receptor, 90% positive and progesterone receptor, 1% positive, both with strong staining intensity. Proliferation marker Ki67 at 1%. HER2 equivocal by immunohistochemistry, 2+ but negative by fluorescent in situ hybridization with a signals ratio 1.04 and  number per cell 1.2.   The patient's subsequent history is as detailed below.   PAST MEDICAL HISTORY: Past  Medical History:  Diagnosis Date   A-fib ([place]) 11/2012   PAF   Anxiety    takes Ativan   Asthma 04/07/2011   dx   Bipolar disorder ([place])    Cancer ([place])    Right breast   Depression    Dyspnea    DOE   Early cataracts, bilateral    Fatty liver    Fibromyalgia    Fracture, ankle 10/2008   right and left   GERD (gastroesophageal reflux disease)    Irritable bowel syndrome    Mental disorder    dx bipolar   PONV (postoperative nausea and vomiting)    Sleep apnea 04/2011    does not wear CPAP   Wears glasses   She has chronic sinus headaches, hiatal hernia   PAST SURGICAL HISTORY: Past Surgical History:  Procedure Laterality Date   ABDOMINAL HYSTERECTOMY     COLONOSCOPY     DILATION AND CURETTAGE OF UTERUS  1981   abnormal pap   KNEE ARTHROSCOPY Left 2007   x 2   LUMBAR LAMINECTOMY/DECOMPRESSION MICRODISCECTOMY  05/31/2011   Procedure: LUMBAR LAMINECTOMY/DECOMPRESSION MICRODISCECTOMY;  Surgeon: Johnn Hai;  Location: [facility];  Service: Orthopedics;  Laterality: Left;  Decompression Lumbar four to five and  Lumbar five to Sacral one on Left  (X-Ray)   MASTECTOMY W/ SENTINEL NODE BIOPSY Right 09/16/2018   Procedure: RIGHT MASTECTOMY WITH RIGHT AXILLARY SENTINEL LYMPH NODE BIOPSY;  Surgeon: Alphonsa Overall, MD;  Location: [facility];  Service: General;  Laterality: Right;   PARTIAL HYSTERECTOMY  1982   PORTACATH PLACEMENT Left 10/31/2018   Procedure: INSERTION PORT-A-CATH WITH ULTRASOUND;  Surgeon: Alphonsa Overall, MD;  Location: [facility];  Service: General;  Laterality: Left;   TONSILLECTOMY     as child   TOTAL KNEE ARTHROPLASTY Left 12/18/2005    FAMILY HISTORY Family History  Problem Relation Age of Onset   Anesthesia problems Mother    Heart disease Mother        CHF, atrial fib   Heart failure Mother    Anesthesia problems Sister    Colon polyps Father    Heart disease Father        "Fluid around the heart"    Hypertension Sister    Breast cancer Maternal Aunt    Colon cancer Maternal Aunt    Prostate cancer Neg Hx    Ovarian cancer Neg Hx    As of February 2019, patient father is alive at 31 years old. Patient mother is alive at 11 years old. She notes a family hx of breast cancer. A maternal aunt was diagnosed with breast cancer, but Doris Smith is unsure if it was in one or both breasts. She has 3 siblings, 3 sisters and 0 brothers.   GYNECOLOGIC HISTORY:  No LMP recorded. Patient is postmenopausal. Menarche: 68 years old Age at first live birth: 68 years old [place] P 2 LMP about 42 years ago Contraceptive no HRT no  Hysterectomy? partial So? no   SOCIAL HISTORY: Doris Smith worked in [employer] for 15 years. She is on disability because her back, knees, and nerves. Her husband, [name], has been at [employer] 43 years as a Freight forwarder.  Even though their home is in [place] they are actually living in [place] in an apartment while her husband works there.  Son Legrand Como, age 31, works  as a Chief Strategy Officer in [place], [place]. Son Shanon Brow, age 13, works as a Building control surveyor in [place], [place]. They have 4 grandchildren, 2 great-grandchildren with 2 more on the way. She attends a [employer].     ADVANCED DIRECTIVES:    HEALTH MAINTENANCE: Social History   Tobacco Use   Smoking status: Never Smoker   Smokeless tobacco: Never Used  Substance Use Topics   Alcohol use: No   Drug use: No     Colonoscopy: 2019  PAP: 11/03/2014, normal  Bone density: 10/13/2016, T-score of -2.1   Allergies  Allergen Reactions   Iohexol     "over 20 years ago" had reaction "hurting in arm and heart"-Done in [place], [place].Marland Kitchenphysician stopped CT at that time.   Codeine Other (See Comments)    Makes feel like she is wild    Prednisone Other (See Comments)    Makes me very irritable   Sulfonamide Derivatives Other (See Comments)    Upset stomach   Celecoxib Nausea And Vomiting   Sulfa Antibiotics Nausea And Vomiting     Vitamin B12 Rash    Current Outpatient Medications  Medication Sig Dispense Refill   anastrozole (ARIMIDEX) 1 MG tablet      benzonatate (TESSALON PERLES) 100 MG capsule Take 1 capsule (100 mg total) by mouth 3 (three) times daily as needed for cough. 20 capsule 0   budesonide-formoterol (SYMBICORT) 160-4.5 MCG/ACT inhaler Inhale 2 puffs into the lungs 2 (two) times daily.     ciprofloxacin (CIPRO) 500 MG tablet Take 1 tablet (500 mg total) by mouth 2 (two) times daily. 14 tablet 0   clotrimazole-betamethasone (LOTRISONE) cream Apply 1 application topically 2 (two) times daily. (Patient taking differently: Apply 1 application topically 2 (two) times daily as needed (rash). ) 30 g 0   dexamethasone (DECADRON) 4 MG tablet Take 1 tablet (4 mg total) by mouth daily. Start the day after chemotherapy for 2 days. Take with food. 30 tablet 1   esomeprazole (NEXIUM) 40 MG capsule TAKE 1 CAPSULE BY MOUTH ONCE DAILY 30 MINUTES BEFORE BREAKFAST 30 capsule 0   fluticasone (FLONASE) 50 MCG/ACT nasal spray Place 2 sprays into both nostrils daily. 16 g 6   fluticasone furoate-vilanterol (BREO ELLIPTA) 200-25 MCG/INH AEPB Inhale 1 puff into the lungs daily.     gabapentin (NEURONTIN) 300 MG capsule Take 1 capsule (300 mg total) by mouth 3 (three) times daily. (Patient taking differently: Take 300 mg by mouth 2 (two) times daily. ) 90 capsule 5   ibuprofen (ADVIL) 200 MG tablet Take 200 mg by mouth every 6 (six) hours as needed.     lamoTRIgine (LAMICTAL) 100 MG tablet Take 100 mg by mouth 2 (two) times daily.      lidocaine-prilocaine (EMLA) cream Apply to affected area once 30 g 3   LORazepam (ATIVAN) 0.5 MG tablet Take 1 tablet (0.5 mg total) by mouth at bedtime as needed (Nausea or vomiting). 30 tablet 0   magic mouthwash SOLN Take 5 mLs by mouth 4 (four) times daily as needed for mouth pain. 240 mL 0   montelukast (SINGULAIR) 10 MG tablet Take 10 mg by mouth at bedtime.     omeprazole  (PRILOSEC) 20 MG capsule Take 1 capsule (20 mg total) by mouth daily. 30 capsule 0   prochlorperazine (COMPAZINE) 10 MG tablet Take 1 tablet (10 mg total) by mouth every 6 (six) hours as needed (Nausea or vomiting). 30 tablet 1   Vilazodone HCl (VIIBRYD) 40 MG TABS Take  40 mg by mouth daily.     No current facility-administered medications for this visit.     OBJECTIVE: Vitals:   01/07/19 0905  BP: (!) 106/51  Pulse: 74  Resp: 18  Temp: 98.7 F (37.1 C)  SpO2: 100%     Body mass index is 36.44 kg/m.   Wt Readings from Last 3 Encounters:  01/07/19 212 lb 4.8 oz (96.3 kg)  12/25/18 218 lb 3.2 oz (99 kg)  12/24/18 217 lb 12.8 oz (98.8 kg)  ECOG FS:1 - Symptomatic but completely ambulatory GENERAL: Patient is a tired appearing female in no acute distress HEENT:  Sclerae anicteric.  Oropharynx clear and moist. No ulcerations or evidence of oropharyngeal candidiasis. Neck is supple.  NODES:  No cervical, supraclavicular, or axillary lymphadenopathy palpated.  BREAST EXAM:  Deferred. LUNGS:  Clear to auscultation bilaterally.  No wheezes or rhonchi. HEART:  Regular rate and rhythm. No murmur appreciated. ABDOMEN:  Soft, nontender.  Positive, normoactive bowel sounds. No organomegaly palpated. MSK:  No focal spinal tenderness to palpation. Full range of motion bilaterally in the upper extremities. EXTREMITIES:  No peripheral edema.   SKIN:  Clear with no obvious rashes or skin changes. No nail dyscrasia. NEURO:  Nonfocal. Well oriented.  Flat affect     LAB RESULTS:  CMP     Component Value Date/Time   NA 140 01/07/2019 0835   NA 142 10/13/2016 1045   K 4.2 01/07/2019 0835   CL 106 01/07/2019 0835   CO2 28 01/07/2019 0835   GLUCOSE 97 01/07/2019 0835   BUN 10 01/07/2019 0835   BUN 8 10/13/2016 1045   CREATININE 0.85 01/07/2019 0835   CREATININE 0.90 12/19/2018 1434   CALCIUM 8.4 (L) 01/07/2019 0835   PROT 6.8 01/07/2019 0835   PROT 6.3 10/13/2016 1045   ALBUMIN 3.3  (L) 01/07/2019 0835   ALBUMIN 3.9 10/13/2016 1045   AST 13 (L) 01/07/2019 0835   AST 14 (L) 08/14/2018 0758   ALT 12 01/07/2019 0835   ALT 10 08/14/2018 0758   ALKPHOS 103 01/07/2019 0835   BILITOT 0.5 01/07/2019 0835   BILITOT 0.5 08/14/2018 0758   GFRNONAA >60 01/07/2019 0835   GFRNONAA >60 12/19/2018 1434   GFRAA >60 01/07/2019 0835   GFRAA >60 12/19/2018 1434    No results found for: TOTALPROTELP, ALBUMINELP, A1GS, A2GS, BETS, BETA2SER, GAMS, MSPIKE, SPEI  No results found for: KPAFRELGTCHN, LAMBDASER, KAPLAMBRATIO  Lab Results  Component Value Date   WBC 6.8 01/07/2019   NEUTROABS 5.1 01/07/2019   HGB 10.4 (L) 01/07/2019   HCT 32.9 (L) 01/07/2019   MCV 91.1 01/07/2019   PLT 412 (H) 01/07/2019    _0 @  No results found for: LABCA2  No components found for: [id number]  No results for input(s): INR in the last 168 hours.  No results found for: LABCA2  No results found for: ZJI967  No results found for: ELF810  No results found for: FBP102  No results found for: CA2729  No components found for: HGQUANT  No results found for: CEA1 / No results found for: CEA1   No results found for: AFPTUMOR  No results found for: CHROMOGRNA  No results found for: PSA1  Appointment on 01/07/2019  Component Date Value Ref Range Status   Sodium 01/07/2019 140  135 - 145 mmol/L Final   Potassium 01/07/2019 4.2  3.5 - 5.1 mmol/L Final   Chloride 01/07/2019 106  98 - 111 mmol/L Final   CO2 01/07/2019 28  22 - 32 mmol/L Final   Glucose, Bld 01/07/2019 97  70 - 99 mg/dL Final   BUN 01/07/2019 10  8 - 23 mg/dL Final   Creatinine, Ser 01/07/2019 0.85  0.44 - 1.00 mg/dL Final   Calcium 01/07/2019 8.4* 8.9 - 10.3 mg/dL Final   Total Protein 01/07/2019 6.8  6.5 - 8.1 g/dL Final   Albumin 01/07/2019 3.3* 3.5 - 5.0 g/dL Final   AST 01/07/2019 13* 15 - 41 U/L Final   ALT 01/07/2019 12  0 - 44 U/L Final   Alkaline Phosphatase 01/07/2019 103  38 - 126 U/L  Final   Total Bilirubin 01/07/2019 0.5  0.3 - 1.2 mg/dL Final   GFR calc non Af Amer 01/07/2019 >60  >60 mL/min Final   GFR calc Af Amer 01/07/2019 >60  >60 mL/min Final   Anion gap 01/07/2019 6  5 - 15 Final   Performed at [facility], [place] [address] [place], [place] [address]   WBC 01/07/2019 6.8  4.0 - 10.5 K/uL Final   RBC 01/07/2019 3.61* 3.87 - 5.11 MIL/uL Final   Hemoglobin 01/07/2019 10.4* 12.0 - 15.0 g/dL Final   HCT 01/07/2019 32.9* 36.0 - 46.0 % Final   MCV 01/07/2019 91.1  80.0 - 100.0 fL Final   MCH 01/07/2019 28.8  26.0 - 34.0 pg Final   MCHC 01/07/2019 31.6  30.0 - 36.0 g/dL Final   RDW 01/07/2019 16.8* 11.5 - 15.5 % Final   Platelets 01/07/2019 412* 150 - 400 K/uL Final   nRBC 01/07/2019 0.3* 0.0 - 0.2 % Final   Neutrophils Relative % 01/07/2019 75  % Final   Neutro Abs 01/07/2019 5.1  1.7 - 7.7 K/uL Final   Lymphocytes Relative 01/07/2019 8  % Final   Lymphs Abs 01/07/2019 0.6* 0.7 - 4.0 K/uL Final   Monocytes Relative 01/07/2019 14  % Final   Monocytes Absolute 01/07/2019 0.9  0.1 - 1.0 K/uL Final   Eosinophils Relative 01/07/2019 1  % Final   Eosinophils Absolute 01/07/2019 0.1  0.0 - 0.5 K/uL Final   Basophils Relative 01/07/2019 1  % Final   Basophils Absolute 01/07/2019 0.1  0.0 - 0.1 K/uL Final   Immature Granulocytes 01/07/2019 1  % Final   Abs Immature Granulocytes 01/07/2019 0.05  0.00 - 0.07 K/uL Final   Performed at [facility], [place] [address] [place], [place] [address]    (this displays the last labs from the last 3 days)  No results found for: TOTALPROTELP, ALBUMINELP, A1GS, A2GS, BETS, BETA2SER, GAMS, MSPIKE, SPEI (this displays SPEP labs)  No results found for: KPAFRELGTCHN, LAMBDASER, KAPLAMBRATIO (kappa/lambda light chains)  No results found for: HGBA, HGBA2QUANT, HGBFQUANT, HGBSQUAN (Hemoglobinopathy evaluation)   No results found for: LDH  No results found  for: IRON, TIBC, IRONPCTSAT (Iron and TIBC)  No results found for: FERRITIN  Urinalysis    Component Value Date/Time   COLORURINE YELLOW 12/19/2018 1355   APPEARANCEUR HAZY (A) 12/19/2018 1355   APPEARANCEUR Clear 05/03/2018 1204   LABSPEC 1.016 12/19/2018 1355   PHURINE 5.0 12/19/2018 [place] 12/19/2018 [place] 12/19/2018 [place] 12/19/2018 1355   BILIRUBINUR Negative 05/03/2018 1204   KETONESUR NEGATIVE 12/19/2018 1355   PROTEINUR NEGATIVE 12/19/2018 1355   UROBILINOGEN negative 11/03/2014 1039   UROBILINOGEN 0.2 05/25/2011 0923   NITRITE NEGATIVE 12/19/2018 1355   LEUKOCYTESUR SMALL (A) 12/19/2018 1355     STUDIES:  No results found.  ELIGIBLE FOR AVAILABLE RESEARCH PROTOCOL: no   ASSESSMENT: 68 y.o. [place], [place] woman status post right breast biopsy 08/02/2018 for a clinical T3 N0, stage IIA invasive lobular carcinoma, grade 2, estrogen receptor strongly positive, progesterone receptor 1% positive, with no HER-2 amplification and an MIB-1 of 1%.  (a) CT scan of the head and chest, without contrast 08/23/2018 showed nonspecific 0.4 cm left lower lobe pulmonary nodule, no definitive metastatic disease  (b) bone scan 08/23/2018 shows multiple spinal areas of abnormal uptake, but  (c) total spinal MRI 09/03/2018 finds bone scan findings to be secondary to degenerative disease, no evidence of metastatic disease.  (1) status post right mastectomy and sentinel lymph node sampling 09/16/2018 showing a pT3 pN1(mic), stage IIA-IIIA invasive lobular breast cancer, grade 2, with 1 of 5 sampled lymph nodes involved by micrometastatic deposit, with extra nodular extension; ample margins  (2)  MammaPrint "high risk" suggests a 5-year metastasis free survival of 93% with chemotherapy, with an absolute chemotherapy benefit in the greater than 12% range  (3) adjuvant radiation to follow (can be done concurrently with chemotherapy)  (4)  anastrozole started neoadjuvantly (on 08/14/2018), discontinued 10/14/2018 in preparation for chemotherapy  (5) Caris requested 10/14/2018  (6) adjuvant chemotherapy consisting of cyclophosphamide, methotrexate, fluorouracil (CMF) started 11/05/2018, to be repeated every 21 days x 8   PLAN: Doris Smith continues to struggle with the chemotherapy that she is receiving, and now is unhappy with the radiation and the traveling for radiation.  I encouraged Doris Smith that she will now be getting one less chemo agent during radiation.  I reviewed her nausea medications and recommended that she try them when she feels nauseated.  I reviewed with Doris Smith that the goal for her treatment is cure.   Doris Smith has a dry cough.  I recommended that she undergo the chest xray that has been ordered.  She says she will consider this, but let me know that she isn't going to wait for it to be done.  I offered to call over to radiology when she is here for her radiation to see what the wait is, however she declined.    Doris Smith will continue with her treatments.  She will return in one week for labs, f/u, and IV fluids.  She knows to call for any questions or concerns that may arise prior to her next appointment with [place].    A total of (30) minutes of face-to-face time was spent with this patient with greater than 50% of that time in counseling and care-coordination.  Wilber Bihari, NP  01/08/19 7:30 AM Medical Oncology and Hematology [facility] [address] [place], [place] [address] Tel. [phone]    Fax. [phone]

## 2019-01-09 ENCOUNTER — Encounter: Payer: Self-pay | Admitting: General Practice

## 2019-01-09 ENCOUNTER — Ambulatory Visit: Payer: BC Managed Care – PPO

## 2019-01-09 NOTE — Progress Notes (Signed)
[place] CSW Progress Notes  Request from Wilber Bihari NP to reach out to patient and offer more psychosocial support.  Called patient, no answer and no VM, unable to connect.  Will try to see patient at her next [facility] visit.  Edwyna Shell, LCSW Clinical Social Worker Phone:  [phone]

## 2019-01-13 ENCOUNTER — Other Ambulatory Visit: Payer: Self-pay

## 2019-01-13 ENCOUNTER — Ambulatory Visit
Admission: RE | Admit: 2019-01-13 | Discharge: 2019-01-13 | Disposition: A | Discharge status: Home / Self Care | Payer: BC Managed Care – PPO | Source: Ambulatory Visit | Attending: Radiation Oncology | Admitting: Radiation Oncology

## 2019-01-13 ENCOUNTER — Telehealth: Payer: Self-pay | Admitting: *Deleted

## 2019-01-13 DIAGNOSIS — C50411 Malignant neoplasm of upper-outer quadrant of right female breast: Secondary | ICD-10-CM | POA: Diagnosis not present

## 2019-01-13 DIAGNOSIS — Z170 Estrogen receptor positive status [ER+]: Secondary | ICD-10-CM | POA: Diagnosis not present

## 2019-01-13 DIAGNOSIS — Z510 Encounter for antineoplastic radiation therapy: Secondary | ICD-10-CM | POA: Diagnosis not present

## 2019-01-13 NOTE — Telephone Encounter (Signed)
Unable to leave a message, no voicemail set up.

## 2019-01-14 ENCOUNTER — Other Ambulatory Visit: Payer: Self-pay

## 2019-01-14 ENCOUNTER — Ambulatory Visit
Admission: RE | Admit: 2019-01-14 | Discharge: 2019-01-14 | Disposition: A | Discharge status: Home / Self Care | Payer: BC Managed Care – PPO | Source: Ambulatory Visit | Attending: Radiation Oncology | Admitting: Radiation Oncology

## 2019-01-14 DIAGNOSIS — Z170 Estrogen receptor positive status [ER+]: Secondary | ICD-10-CM | POA: Diagnosis not present

## 2019-01-14 DIAGNOSIS — Z510 Encounter for antineoplastic radiation therapy: Secondary | ICD-10-CM | POA: Diagnosis not present

## 2019-01-14 DIAGNOSIS — F410 Panic disorder [episodic paroxysmal anxiety] without agoraphobia: Secondary | ICD-10-CM | POA: Diagnosis not present

## 2019-01-14 DIAGNOSIS — F312 Bipolar disorder, current episode manic severe with psychotic features: Secondary | ICD-10-CM | POA: Diagnosis not present

## 2019-01-14 DIAGNOSIS — F4310 Post-traumatic stress disorder, unspecified: Secondary | ICD-10-CM | POA: Diagnosis not present

## 2019-01-14 DIAGNOSIS — C50411 Malignant neoplasm of upper-outer quadrant of right female breast: Secondary | ICD-10-CM | POA: Diagnosis not present

## 2019-01-14 NOTE — Progress Notes (Signed)
[place]  [phone] [phone]     ID: Doris Smith DOB: [birth year]  MR#: [id number]  XBM#:[id number]  Patient Care Team: Chevis Pretty, FNP as PCP - General (Nurse Practitioner) Alphonsa Overall, MD as Consulting Physician (General Surgery) Kimari Lienhard, Virgie Dad, MD as Consulting Physician (Oncology) Kyung Rudd, MD as Consulting Physician (Radiation Oncology) Elijio Miles, MD as Consulting Physician (Pulmonary Disease) Netta Cedars, MD as Consulting Physician (Orthopedic Surgery) Susa Day, MD as Consulting Physician (Orthopedic Surgery) Janeth Rase, NP as Nurse Practitioner (Adult Health Nurse Practitioner) Mauro Kaufmann, RN as Oncology Nurse Navigator Rockwell Germany, RN as Oncology Nurse Navigator Irene Limbo, MD as Consulting Physician (Plastic Surgery) Chauncey Cruel, MD OTHER MD: Chapman Moss, psychiatry   CHIEF COMPLAINT: Estrogen receptor positive breast cancer  CURRENT TREATMENT: Adjuvant chemotherapy   INTERVAL HISTORY: [name] is seen today for follow-up and treatment of her estrogen receptor positive breast cancer.  She continues on cyclophosphamide, methotrexate, fluorouracil (CMF), to be repeated every 21 days x 8. Today is day 8 cycle 4.  We are holding the Methotrexate while she is undergoing adjuvant radiation. She is receiving fluids today. She reports soreness to her port site.  She is currently undergoing radiation treatments. Her last treatment is scheduled for 02/19/2019. She states her arm hurts from laying on the table because of the issues she has with the muscles in her arm. She dislikes the distance between her and Dr. Lisbeth Renshaw-- the undertreatment appointments are performed via WebEx. She reports fatigue. She states her skin is holding up well with the cream she was given.   REVIEW OF SYSTEMS: Elnita reports cough related to allergies. She reports irritability and depression. She  notes an incident last week in which she began coughing at chemotherapy, was given a mask, and she loudly "went off" on the staff (see Val's telephone note on 01/07/2019). A detailed review of systems was otherwise entirely negative.   HISTORY OF CURRENT ILLNESS: "Doris Smith" presented to her PCP, Dr. Hassell Done, with right breast pain, fullness, and nipple inversion since November 2019. She proceeded to undergo bilateral diagnostic mammography with tomography and right breast ultrasonography at [facility] on 07/31/2018 showing: findings compatible with multicentric breast cancer spanning at least the upper inner and upper outer quadrants of the right breast; normal right axilla. Palpable firmness of approximately 1.5 cm was noted in the 9 o'clock position, which was also seen on ultrasound with indisctinct margins measuring 2.7 x 2.3 x 2.2 cm. At 1 o'clock, a mass with poorly defined margins measures 1.9 x 1 x 0.8 cm. Additional poorly defined masses were seen in the 12 o'clock retroareolar region.  Accordingly on 08/02/2018 she proceeded to biopsy of the right breast area in question. The pathology ([id number]) from this procedure showed: invasive mammary carcinoma at 9 o'clock and 1 o'clock; e-cadherin negative, consistent with lobular phenotype; grade 2-3. Prognostic indicators significant for: estrogen receptor, 90% positive and progesterone receptor, 1% positive, both with strong staining intensity. Proliferation marker Ki67 at 1%. HER2 equivocal by immunohistochemistry, 2+ but negative by fluorescent in situ hybridization with a signals ratio 1.04 and number per cell 1.2.   The patient's subsequent history is as detailed below.   PAST MEDICAL HISTORY: Past Medical History:  Diagnosis Date  . A-fib ([place]) 11/2012   PAF  . Anxiety    takes Ativan  . Asthma 04/07/2011   dx  . Bipolar disorder ([place])   . Cancer [facility])    Right breast  .  Depression   . Dyspnea    DOE  . Early cataracts, bilateral    . Fatty liver   . Fibromyalgia   . Fracture, ankle 10/2008   right and left  . GERD (gastroesophageal reflux disease)   . Irritable bowel syndrome   . Mental disorder    dx bipolar  . PONV (postoperative nausea and vomiting)   . Sleep apnea 04/2011    does not wear CPAP  . Wears glasses   She has chronic sinus headaches, hiatal hernia   PAST SURGICAL HISTORY: Past Surgical History:  Procedure Laterality Date  . ABDOMINAL HYSTERECTOMY    . COLONOSCOPY    . DILATION AND CURETTAGE OF UTERUS  1981   abnormal pap  . KNEE ARTHROSCOPY Left 2007   x 2  . LUMBAR LAMINECTOMY/DECOMPRESSION MICRODISCECTOMY  05/31/2011   Procedure: LUMBAR LAMINECTOMY/DECOMPRESSION MICRODISCECTOMY;  Surgeon: Johnn Hai;  Location: [facility];  Service: Orthopedics;  Laterality: Left;  Decompression Lumbar four to five and  Lumbar five to Sacral one on Left  (X-Ray)  . MASTECTOMY W/ SENTINEL NODE BIOPSY Right 09/16/2018   Procedure: RIGHT MASTECTOMY WITH RIGHT AXILLARY SENTINEL LYMPH NODE BIOPSY;  Surgeon: Alphonsa Overall, MD;  Location: [place];  Service: General;  Laterality: Right;  . PARTIAL HYSTERECTOMY  1982  . PORTACATH PLACEMENT Left 10/31/2018   Procedure: INSERTION PORT-A-CATH WITH ULTRASOUND;  Surgeon: Alphonsa Overall, MD;  Location: [place];  Service: General;  Laterality: Left;  . TONSILLECTOMY     as child  . TOTAL KNEE ARTHROPLASTY Left 12/18/2005    FAMILY HISTORY Family History  Problem Relation Age of Onset  . Anesthesia problems Mother   . Heart disease Mother        CHF, atrial fib  . Heart failure Mother   . Anesthesia problems Sister   . Colon polyps Father   . Heart disease Father        "Fluid around the heart"  . Hypertension Sister   . Breast cancer Maternal Aunt   . Colon cancer Maternal Aunt   . Prostate cancer Neg Hx   . Ovarian cancer Neg Hx    As of February 2019, patient father is alive at 48 years old. Patient mother is alive at 66 years old. She  notes a family hx of breast cancer. A maternal aunt was diagnosed with breast cancer, but Doris Smith is unsure if it was in one or both breasts. She has 3 siblings, 3 sisters and 0 brothers.   GYNECOLOGIC HISTORY:  No LMP recorded. Patient is postmenopausal. Menarche: 68 years old Age at first live birth: 68 years old [place] P 2 LMP about 42 years ago Contraceptive no HRT no  Hysterectomy? partial So? no   SOCIAL HISTORY: Doris Smith worked in [employer] for 15 years. She is on disability because her back, knees, and nerves. Her husband, [name], has been at [employer] 43 years as a Freight forwarder.  Even though their home is in [place] they are actually living in [place] in an apartment while her husband works there.  Son Legrand Como, age 83, works as a Chief Strategy Officer in [place], [place]. Son Shanon Brow, age 37, works as a Building control surveyor in [place], [place]. They have 4 grandchildren, 2 great-grandchildren with 2 more on the way. She attends a [employer].     ADVANCED DIRECTIVES:    HEALTH MAINTENANCE: Social History   Tobacco Use  . Smoking status: Never Smoker  . Smokeless tobacco: Never Used  Substance  Use Topics  . Alcohol use: No  . Drug use: No     Colonoscopy: 2019  PAP: 11/03/2014, normal  Bone density: 10/13/2016, T-score of -2.1   Allergies  Allergen Reactions  . Iohexol     "over 20 years ago" had reaction "hurting in arm and heart"-Done in [place], [place].Marland Kitchenphysician stopped CT at that time.  . Codeine Other (See Comments)    Makes feel like she is wild   . Prednisone Other (See Comments)    Makes me very irritable  . Sulfonamide Derivatives Other (See Comments)    Upset stomach  . Celecoxib Nausea And Vomiting  . Sulfa Antibiotics Nausea And Vomiting  . Vitamin B12 Rash    Current Outpatient Medications  Medication Sig Dispense Refill  . anastrozole (ARIMIDEX) 1 MG tablet     . benzonatate (TESSALON PERLES) 100 MG capsule Take 1 capsule (100 mg total) by mouth 3 (three) times daily as needed  for cough. 20 capsule 0  . budesonide-formoterol (SYMBICORT) 160-4.5 MCG/ACT inhaler Inhale 2 puffs into the lungs 2 (two) times daily.    . ciprofloxacin (CIPRO) 500 MG tablet Take 1 tablet (500 mg total) by mouth 2 (two) times daily. 14 tablet 0  . clotrimazole-betamethasone (LOTRISONE) cream Apply 1 application topically 2 (two) times daily. (Patient taking differently: Apply 1 application topically 2 (two) times daily as needed (rash). ) 30 g 0  . dexamethasone (DECADRON) 4 MG tablet Take 1 tablet (4 mg total) by mouth daily. Start the day after chemotherapy for 2 days. Take with food. 30 tablet 1  . esomeprazole (NEXIUM) 40 MG capsule TAKE 1 CAPSULE BY MOUTH ONCE DAILY 30 MINUTES BEFORE BREAKFAST 30 capsule 0  . fluticasone (FLONASE) 50 MCG/ACT nasal spray Place 2 sprays into both nostrils daily. 16 g 6  . fluticasone furoate-vilanterol (BREO ELLIPTA) 200-25 MCG/INH AEPB Inhale 1 puff into the lungs daily.    Marland Kitchen gabapentin (NEURONTIN) 300 MG capsule Take 1 capsule (300 mg total) by mouth 3 (three) times daily. (Patient taking differently: Take 300 mg by mouth 2 (two) times daily. ) 90 capsule 5  . ibuprofen (ADVIL) 200 MG tablet Take 200 mg by mouth every 6 (six) hours as needed.    . lamoTRIgine (LAMICTAL) 100 MG tablet Take 100 mg by mouth 2 (two) times daily.     Marland Kitchen lidocaine-prilocaine (EMLA) cream Apply to affected area once 30 g 3  . LORazepam (ATIVAN) 0.5 MG tablet Take 1 tablet (0.5 mg total) by mouth at bedtime as needed (Nausea or vomiting). 30 tablet 0  . magic mouthwash SOLN Take 5 mLs by mouth 4 (four) times daily as needed for mouth pain. 240 mL 0  . montelukast (SINGULAIR) 10 MG tablet Take 10 mg by mouth at bedtime.    Marland Kitchen omeprazole (PRILOSEC) 20 MG capsule Take 1 capsule (20 mg total) by mouth daily. 30 capsule 0  . prochlorperazine (COMPAZINE) 10 MG tablet Take 1 tablet (10 mg total) by mouth every 6 (six) hours as needed (Nausea or vomiting). 30 tablet 1  . Vilazodone HCl  (VIIBRYD) 40 MG TABS Take 40 mg by mouth daily.     No current facility-administered medications for this visit.     OBJECTIVE: Middle-aged white woman who appears stated age 23:   01/15/19 1100  BP: (!) 109/41  Pulse: 69  Resp: 18  Temp: 98.5 F (36.9 C)  SpO2: 100%     Body mass index is 35.91 kg/m.   Wt Readings  from Last 3 Encounters:  01/15/19 209 lb 3.2 oz (94.9 kg)  01/07/19 212 lb 4.8 oz (96.3 kg)  12/25/18 218 lb 3.2 oz (99 kg)  ECOG FS:1 - Symptomatic but completely ambulatory  Sclerae unicteric, pupils round and equal Wearing a mask No cervical or supraclavicular adenopathy Lungs no rales or rhonchi Heart regular rate and rhythm Abd soft, nontender, positive bowel sounds MSK no focal spinal tenderness, no upper extremity lymphedema Neuro: nonfocal, well oriented, anxious affect Breasts: The right breast is status post mastectomy and is currently receiving radiation.  There is no erythema, desquamation, or evidence of disease recurrence.  The left breast is benign.  Both axillae are benign.      LAB RESULTS:  CMP     Component Value Date/Time   NA 143 01/15/2019 0959   NA 142 10/13/2016 1045   K 3.7 01/15/2019 0959   CL 107 01/15/2019 0959   CO2 29 01/15/2019 0959   GLUCOSE 102 (H) 01/15/2019 0959   BUN 10 01/15/2019 0959   BUN 8 10/13/2016 1045   CREATININE 0.82 01/15/2019 0959   CREATININE 0.90 12/19/2018 1434   CALCIUM 8.1 (L) 01/15/2019 0959   PROT 6.3 (L) 01/15/2019 0959   PROT 6.3 10/13/2016 1045   ALBUMIN 3.2 (L) 01/15/2019 0959   ALBUMIN 3.9 10/13/2016 1045   AST 12 (L) 01/15/2019 0959   AST 14 (L) 08/14/2018 0758   ALT 10 01/15/2019 0959   ALT 10 08/14/2018 0758   ALKPHOS 98 01/15/2019 0959   BILITOT 0.4 01/15/2019 0959   BILITOT 0.5 08/14/2018 0758   GFRNONAA >60 01/15/2019 0959   GFRNONAA >60 12/19/2018 1434   GFRAA >60 01/15/2019 0959   GFRAA >60 12/19/2018 1434    No results found for: TOTALPROTELP, ALBUMINELP, A1GS,  A2GS, BETS, BETA2SER, GAMS, MSPIKE, SPEI  No results found for: KPAFRELGTCHN, LAMBDASER, KAPLAMBRATIO  Lab Results  Component Value Date   WBC 3.5 (L) 01/15/2019   NEUTROABS 2.8 01/15/2019   HGB 10.0 (L) 01/15/2019   HCT 31.1 (L) 01/15/2019   MCV 92.0 01/15/2019   PLT 223 01/15/2019    _0 @  No results found for: LABCA2  No components found for: QMGQQP619  No results for input(s): INR in the last 168 hours.  No results found for: LABCA2  No results found for: JKD326  No results found for: ZTI458  No results found for: KDX833  No results found for: CA2729  No components found for: HGQUANT  No results found for: CEA1 / No results found for: CEA1   No results found for: AFPTUMOR  No results found for: CHROMOGRNA  No results found for: PSA1  Appointment on 01/15/2019  Component Date Value Ref Range Status  . Sodium 01/15/2019 143  135 - 145 mmol/L Final  . Potassium 01/15/2019 3.7  3.5 - 5.1 mmol/L Final  . Chloride 01/15/2019 107  98 - 111 mmol/L Final  . CO2 01/15/2019 29  22 - 32 mmol/L Final  . Glucose, Bld 01/15/2019 102* 70 - 99 mg/dL Final  . BUN 01/15/2019 10  8 - 23 mg/dL Final  . Creatinine, Ser 01/15/2019 0.82  0.44 - 1.00 mg/dL Final  . Calcium 01/15/2019 8.1* 8.9 - 10.3 mg/dL Final  . Total Protein 01/15/2019 6.3* 6.5 - 8.1 g/dL Final  . Albumin 01/15/2019 3.2* 3.5 - 5.0 g/dL Final  . AST 01/15/2019 12* 15 - 41 U/L Final  . ALT 01/15/2019 10  0 - 44 U/L Final  . Alkaline Phosphatase 01/15/2019  98  38 - 126 U/L Final  . Total Bilirubin 01/15/2019 0.4  0.3 - 1.2 mg/dL Final  . GFR calc non Af Amer 01/15/2019 >60  >60 mL/min Final  . GFR calc Af Amer 01/15/2019 >60  >60 mL/min Final  . Anion gap 01/15/2019 7  5 - 15 Final   Performed at [facility], [place] [address] [place], [place] [address]  . WBC 01/15/2019 3.5* 4.0 - 10.5 K/uL Final  . RBC 01/15/2019 3.38* 3.87 - 5.11 MIL/uL Final  . Hemoglobin 01/15/2019  10.0* 12.0 - 15.0 g/dL Final  . HCT 01/15/2019 31.1* 36.0 - 46.0 % Final  . MCV 01/15/2019 92.0  80.0 - 100.0 fL Final  . MCH 01/15/2019 29.6  26.0 - 34.0 pg Final  . MCHC 01/15/2019 32.2  30.0 - 36.0 g/dL Final  . RDW 01/15/2019 16.8* 11.5 - 15.5 % Final  . Platelets 01/15/2019 223  150 - 400 K/uL Final  . nRBC 01/15/2019 0.0  0.0 - 0.2 % Final  . Neutrophils Relative % 01/15/2019 78  % Final  . Neutro Abs 01/15/2019 2.8  1.7 - 7.7 K/uL Final  . Lymphocytes Relative 01/15/2019 6  % Final  . Lymphs Abs 01/15/2019 0.2* 0.7 - 4.0 K/uL Final  . Monocytes Relative 01/15/2019 7  % Final  . Monocytes Absolute 01/15/2019 0.2  0.1 - 1.0 K/uL Final  . Eosinophils Relative 01/15/2019 3  % Final  . Eosinophils Absolute 01/15/2019 0.1  0.0 - 0.5 K/uL Final  . Basophils Relative 01/15/2019 3  % Final  . Basophils Absolute 01/15/2019 0.1  0.0 - 0.1 K/uL Final  . Immature Granulocytes 01/15/2019 3  % Final  . Abs Immature Granulocytes 01/15/2019 0.10* 0.00 - 0.07 K/uL Final   Performed at [facility], [address] [place], [place] [address]    (this displays the last labs from the last 3 days)  No results found for: TOTALPROTELP, ALBUMINELP, A1GS, A2GS, BETS, BETA2SER, GAMS, MSPIKE, SPEI (this displays SPEP labs)  No results found for: KPAFRELGTCHN, LAMBDASER, KAPLAMBRATIO (kappa/lambda light chains)  No results found for: HGBA, HGBA2QUANT, HGBFQUANT, HGBSQUAN (Hemoglobinopathy evaluation)   No results found for: LDH  No results found for: IRON, TIBC, IRONPCTSAT (Iron and TIBC)  No results found for: FERRITIN  Urinalysis    Component Value Date/Time   COLORURINE YELLOW 12/19/2018 1355   APPEARANCEUR HAZY (A) 12/19/2018 1355   APPEARANCEUR Clear 05/03/2018 1204   LABSPEC 1.016 12/19/2018 1355   PHURINE 5.0 12/19/2018 [place] 12/19/2018 [place] 12/19/2018 [place] 12/19/2018 1355   BILIRUBINUR Negative  05/03/2018 1204   KETONESUR NEGATIVE 12/19/2018 1355   PROTEINUR NEGATIVE 12/19/2018 1355   UROBILINOGEN negative 11/03/2014 1039   UROBILINOGEN 0.2 05/25/2011 0923   NITRITE NEGATIVE 12/19/2018 1355   LEUKOCYTESUR SMALL (A) 12/19/2018 1355     STUDIES: No results found.  ELIGIBLE FOR AVAILABLE RESEARCH PROTOCOL: no   ASSESSMENT: 68 y.o. [place], [place] woman status post right breast biopsy 08/02/2018 for a clinical T3 N0, stage IIA invasive lobular carcinoma, grade 2, estrogen receptor strongly positive, progesterone receptor 1% positive, with no HER-2 amplification and an MIB-1 of 1%.  (a) CT scan of the head and chest, without contrast 08/23/2018 showed nonspecific 0.4 cm left lower lobe pulmonary nodule, no definitive metastatic disease  (b) bone scan 08/23/2018 shows multiple spinal areas of abnormal uptake, but  (c) total spinal MRI 09/03/2018 finds  bone scan findings to be secondary to degenerative disease, no evidence of metastatic disease.  (1) status post right mastectomy and sentinel lymph node sampling 09/16/2018 showing a pT3 pN1(mic), stage IIA-IIIA invasive lobular breast cancer, grade 2, with 1 of 5 sampled lymph nodes involved by micrometastatic deposit, with extra nodular extension; ample margins  (2)  MammaPrint "high risk" suggests a 5-year metastasis free survival of 93% with chemotherapy, with an absolute chemotherapy benefit in the greater than 12% range  (3) adjuvant radiation to follow (can be done concurrently with chemotherapy)  (4) anastrozole started neoadjuvantly (on 08/14/2018), discontinued 10/14/2018 in preparation for chemotherapy  (5) Caris requested 10/14/2018  (6) adjuvant chemotherapy consisting of cyclophosphamide, methotrexate, fluorouracil (CMF) started 11/05/2018, to be repeated every 21 days x 8   PLAN: Doris Smith is halfway through her chemotherapy treatments and she so far is tolerating radiation quite well.  This is very encouraging.  She is  very motivated.  She is depressed and finds all this very difficult but she "is going to get through it".  She gets extra points from me for that.  We will give her some supportive fluids today.  She understands that if she continues on radiation she will have significantly more fatigue.  The only thing that will work for that is exercise.  If she can walk early in the day or late in the day that would be very helpful.  She is moody depressed and irritable.  She is taking her medications appropriately  She will return to see [place] for cycle 5.  She knows to call for any other issues that may develop before that visit.   Virgie Dad. Javen Hinderliter, MD  01/15/19 11:28 AM Medical Oncology and Hematology [facility] [address] [place], [place] [address] Tel. [phone]    Fax. [phone]    I, Wilburn Mylar, am acting as scribe for Dr. Virgie Dad. Tevon Berhane.  I, Lurline Del MD, have reviewed the above documentation for accuracy and completeness, and I agree with the above.

## 2019-01-15 ENCOUNTER — Inpatient Hospital Stay (HOSPITAL_BASED_OUTPATIENT_CLINIC_OR_DEPARTMENT_OTHER): Payer: BC Managed Care – PPO | Admitting: Oncology

## 2019-01-15 ENCOUNTER — Inpatient Hospital Stay: Payer: BC Managed Care – PPO

## 2019-01-15 ENCOUNTER — Encounter: Payer: Self-pay | Admitting: *Deleted

## 2019-01-15 ENCOUNTER — Inpatient Hospital Stay: Payer: BC Managed Care – PPO | Attending: Oncology

## 2019-01-15 ENCOUNTER — Ambulatory Visit
Admission: RE | Admit: 2019-01-15 | Discharge: 2019-01-15 | Disposition: A | Discharge status: Home / Self Care | Payer: BC Managed Care – PPO | Source: Ambulatory Visit | Attending: Radiation Oncology | Admitting: Radiation Oncology

## 2019-01-15 ENCOUNTER — Other Ambulatory Visit: Payer: Self-pay

## 2019-01-15 VITALS — BP 109/41 | HR 69 | Temp 98.5°F | Resp 18 | Ht 64.0 in | Wt 209.2 lb

## 2019-01-15 DIAGNOSIS — C50211 Malignant neoplasm of upper-inner quadrant of right female breast: Secondary | ICD-10-CM

## 2019-01-15 DIAGNOSIS — Z170 Estrogen receptor positive status [ER+]: Secondary | ICD-10-CM

## 2019-01-15 DIAGNOSIS — Z79899 Other long term (current) drug therapy: Secondary | ICD-10-CM | POA: Diagnosis not present

## 2019-01-15 DIAGNOSIS — Z9011 Acquired absence of right breast and nipple: Secondary | ICD-10-CM | POA: Diagnosis not present

## 2019-01-15 DIAGNOSIS — R911 Solitary pulmonary nodule: Secondary | ICD-10-CM | POA: Insufficient documentation

## 2019-01-15 DIAGNOSIS — Z5111 Encounter for antineoplastic chemotherapy: Secondary | ICD-10-CM | POA: Insufficient documentation

## 2019-01-15 DIAGNOSIS — Z510 Encounter for antineoplastic radiation therapy: Secondary | ICD-10-CM | POA: Diagnosis not present

## 2019-01-15 DIAGNOSIS — Z171 Estrogen receptor negative status [ER-]: Secondary | ICD-10-CM

## 2019-01-15 DIAGNOSIS — C50411 Malignant neoplasm of upper-outer quadrant of right female breast: Secondary | ICD-10-CM | POA: Diagnosis not present

## 2019-01-15 DIAGNOSIS — C50911 Malignant neoplasm of unspecified site of right female breast: Secondary | ICD-10-CM

## 2019-01-15 DIAGNOSIS — Z95828 Presence of other vascular implants and grafts: Secondary | ICD-10-CM

## 2019-01-15 LAB — COMPREHENSIVE METABOLIC PANEL
ALT: 10 U/L (ref 0–44)
AST: 12 U/L — ABNORMAL LOW (ref 15–41)
Albumin: 3.2 g/dL — ABNORMAL LOW (ref 3.5–5.0)
Alkaline Phosphatase: 98 U/L (ref 38–126)
Anion gap: 7 (ref 5–15)
BUN: 10 mg/dL (ref 8–23)
CO2: 29 mmol/L (ref 22–32)
Calcium: 8.1 mg/dL — ABNORMAL LOW (ref 8.9–10.3)
Chloride: 107 mmol/L (ref 98–111)
Creatinine, Ser: 0.82 mg/dL (ref 0.44–1.00)
GFR calc Af Amer: >60 mL/min (ref >60)
GFR calc non Af Amer: >60 mL/min (ref >60)
Glucose, Bld: 102 mg/dL — ABNORMAL HIGH (ref 70–99)
Potassium: 3.7 mmol/L (ref 3.5–5.1)
Sodium: 143 mmol/L (ref 135–145)
Total Bilirubin: 0.4 mg/dL (ref 0.3–1.2)
Total Protein: 6.3 g/dL — ABNORMAL LOW (ref 6.5–8.1)

## 2019-01-15 LAB — CBC WITH DIFFERENTIAL/PLATELET
Abs Immature Granulocytes: 0.1 10*3/uL — ABNORMAL HIGH (ref 0.00–0.07)
Basophils Absolute: 0.1 10*3/uL (ref 0.0–0.1)
Basophils Relative: 3 %
Eosinophils Absolute: 0.1 10*3/uL (ref 0.0–0.5)
Eosinophils Relative: 3 %
HCT: 31.1 % — ABNORMAL LOW (ref 36.0–46.0)
Hemoglobin: 10 g/dL — ABNORMAL LOW (ref 12.0–15.0)
Immature Granulocytes: 3 %
Lymphocytes Relative: 6 %
Lymphs Abs: 0.2 10*3/uL — ABNORMAL LOW (ref 0.7–4.0)
MCH: 29.6 pg (ref 26.0–34.0)
MCHC: 32.2 g/dL (ref 30.0–36.0)
MCV: 92 fL (ref 80.0–100.0)
Monocytes Absolute: 0.2 10*3/uL (ref 0.1–1.0)
Monocytes Relative: 7 %
Neutro Abs: 2.8 10*3/uL (ref 1.7–7.7)
Neutrophils Relative %: 78 %
Platelets: 223 10*3/uL (ref 150–400)
RBC: 3.38 MIL/uL — ABNORMAL LOW (ref 3.87–5.11)
RDW: 16.8 % — ABNORMAL HIGH (ref 11.5–15.5)
WBC: 3.5 10*3/uL — ABNORMAL LOW (ref 4.0–10.5)
nRBC: 0 % (ref 0.0–0.2)

## 2019-01-15 MED ORDER — SODIUM CHLORIDE 0.9% FLUSH
10.0000 mL | INTRAVENOUS | Status: DC | PRN
  Administered 2019-01-15: 15:00:00 10 mL
  Filled 2019-01-15: qty 10

## 2019-01-15 MED ORDER — SODIUM CHLORIDE 0.9 % IV SOLN
INTRAVENOUS | Status: DC
  Administered 2019-01-15: 13:00:00 via INTRAVENOUS
  Filled 2019-01-15 (×2): qty 250

## 2019-01-15 MED ORDER — HEPARIN SOD (PORK) LOCK FLUSH 100 UNIT/ML IV SOLN
500.0000 [IU] | Freq: Once | INTRAVENOUS | Status: AC | PRN
  Administered 2019-01-15: 500 [IU]
  Filled 2019-01-15: qty 5

## 2019-01-15 NOTE — Patient Instructions (Signed)
 Dehydration, Adult  Dehydration is when there is not enough fluid or water in your body. This happens when you lose more fluids than you take in. Dehydration can range from mild to very bad. It should be treated right away to keep it from getting very bad. Symptoms of mild dehydration may include:  Thirst.  Dry lips.  Slightly dry mouth.  Dry, warm skin.  Dizziness. Symptoms of moderate dehydration may include:  Very dry mouth.  Muscle cramps.  Dark pee (urine). Pee may be the color of tea.  Your body making less pee.  Your eyes making fewer tears.  Heartbeat that is uneven or faster than normal (palpitations).  Headache.  Light-headedness, especially when you stand up from sitting.  Fainting (syncope). Symptoms of very bad dehydration may include:  Changes in skin, such as: ? Cold and clammy skin. ? Blotchy (mottled) or pale skin. ? Skin that does not quickly return to normal after being lightly pinched and let go (poor skin turgor).  Changes in body fluids, such as: ? Feeling very thirsty. ? Your eyes making fewer tears. ? Not sweating when body temperature is high, such as in hot weather. ? Your body making very little pee.  Changes in vital signs, such as: ? Weak pulse. ? Pulse that is more than 100 beats a minute when you are sitting still. ? Fast breathing. ? Low blood pressure.  Other changes, such as: ? Sunken eyes. ? Cold hands and feet. ? Confusion. ? Lack of energy (lethargy). ? Trouble waking up from sleep. ? Short-term weight loss. ? Unconsciousness. Follow these instructions at home:   If told by your doctor, drink an ORS: ? Make an ORS by using instructions on the package. ? Start by drinking small amounts, about  cup (120 mL) every 5-10 minutes. ? Slowly drink more until you have had the amount that your doctor said to have.  Drink enough clear fluid to keep your pee clear or pale yellow. If you were told to drink an ORS, finish  the ORS first, then start slowly drinking clear fluids. Drink fluids such as: ? Water. Do not drink only water by itself. Doing that can make the salt (sodium) level in your body get too low (hyponatremia). ? Ice chips. ? Fruit juice that you have added water to (diluted). ? Low-calorie sports drinks.  Avoid: ? Alcohol. ? Drinks that have a lot of sugar. These include high-calorie sports drinks, fruit juice that does not have water added, and soda. ? Caffeine. ? Foods that are greasy or have a lot of fat or sugar.  Take over-the-counter and prescription medicines only as told by your doctor.  Do not take salt tablets. Doing that can make the salt level in your body get too high (hypernatremia).  Eat foods that have minerals (electrolytes). Examples include bananas, oranges, potatoes, tomatoes, and spinach.  Keep all follow-up visits as told by your doctor. This is important. Contact a doctor if:  You have belly (abdominal) pain that: ? Gets worse. ? Stays in one area (localizes).  You have a rash.  You have a stiff neck.  You get angry or annoyed more easily than normal (irritability).  You are more sleepy than normal.  You have a harder time waking up than normal.  You feel: ? Weak. ? Dizzy. ? Very thirsty.  You have peed (urinated) only a small amount of very dark pee during 6-8 hours. Get help right away if:  You   have symptoms of very bad dehydration.  You cannot drink fluids without throwing up (vomiting).  Your symptoms get worse with treatment.  You have a fever.  You have a very bad headache.  You are throwing up or having watery poop (diarrhea) and it: ? Gets worse. ? Does not go away.  You have blood or something green (bile) in your throw-up.  You have blood in your poop (stool). This may cause poop to look black and tarry.  You have not peed in 6-8 hours.  You pass out (faint).  Your heart rate when you are sitting still is more than 100  beats a minute.  You have trouble breathing. This information is not intended to replace advice given to you by your health care provider. Make sure you discuss any questions you have with your health care provider. Document Released: 04/22/2009 Document Revised: 01/14/2016 Document Reviewed: 08/20/2015 Elsevier Interactive Patient Education  2019 Elsevier Inc.  Coronavirus (COVID-19) Are you at risk?  Are you at risk for the Coronavirus (COVID-19)?  To be considered HIGH RISK for Coronavirus (COVID-19), you have to meet the following criteria:  . Traveled to [place], [place], [place], [place] or [place]; or in the [place] to [place], [place], [place], or [place]; and have fever, cough, and shortness of breath within the last 2 weeks of travel OR . Been in close contact with a person diagnosed with COVID-19 within the last 2 weeks and have fever, cough, and shortness of breath . IF YOU DO NOT MEET THESE CRITERIA, YOU ARE CONSIDERED LOW RISK FOR COVID-19.  What to do if you are HIGH RISK for COVID-19?  . If you are having a medical emergency, call 911. . Seek medical care right away. Before you go to a doctor's office, urgent care or emergency department, call ahead and tell them about your recent travel, contact with someone diagnosed with COVID-19, and your symptoms. You should receive instructions from your physician's office regarding next steps of care.  . When you arrive at healthcare provider, tell the healthcare staff immediately you have returned from visiting [place], [place], [place], [place] or [place]; or traveled in the [place] to [place], [place], [place], or [place]; in the last two weeks or you have been in close contact with a person diagnosed with COVID-19 in the last 2 weeks.   . Tell the health care staff about your symptoms: fever, cough and shortness of breath. . After you have been seen by a medical provider, you will be either: o Tested for  (COVID-19) and discharged home on quarantine except to seek medical care if symptoms worsen, and asked to  - Stay home and avoid contact with others until you get your results (4-5 days)  - Avoid travel on public transportation if possible (such as bus, train, or airplane) or o Sent to the Emergency Department by EMS for evaluation, COVID-19 testing, and possible admission depending on your condition and test results.  What to do if you are LOW RISK for COVID-19?  Reduce your risk of any infection by using the same precautions used for avoiding the common cold or flu:  . Wash your hands often with soap and warm water for at least 20 seconds.  If soap and water are not readily available, use an alcohol-based hand sanitizer with at least 60% alcohol.  . If coughing or sneezing, cover your mouth and nose by coughing or sneezing into the elbow areas   of your shirt or coat, into a tissue or into your sleeve (not your hands). . Avoid shaking hands with others and consider head nods or verbal greetings only. . Avoid touching your eyes, nose, or mouth with unwashed hands.  . Avoid close contact with people who are sick. . Avoid places or events with large numbers of people in one location, like concerts or sporting events. . Carefully consider travel plans you have or are making. . If you are planning any travel outside or inside the [place], visit the CDC's Travelers' Health webpage for the latest health notices. . If you have some symptoms but not all symptoms, continue to monitor at home and seek medical attention if your symptoms worsen. . If you are having a medical emergency, call 911.   ADDITIONAL HEALTHCARE OPTIONS FOR PATIENTS  [facility] / e-Visit: [email or web address]         [facility]: [phone]  [facility]: [phone]                   [facility]: [phone]  

## 2019-01-15 NOTE — Progress Notes (Signed)
[facility]  Clinical Social Work received referral from medical oncology for emotional support.  CSW met with patient at [facility] to offer support and assess for needs.  Patient stated she was doing well, but has been very emotional lately.  CSW and patient discussed feelings and emotions when going through treatment, coupled with the stressors of covid.  Patient stated she spoke with her provider at [facility] this week and it was very helpful.  Patient stated she was very happy with her provider at [facility], who provides med managment.  CSW and patient discussed counseling options.  Patient stated she was aware of the options available through [facility].  CSW provided patient with information on the support team and support services at [facility].  Including support groups and programs.  Patient was very appreciative of contact.  CSW provided contact information and encouraged patient to call with needs or concerns.    Johnnye Lana, MSW, LCSW, OSW-C Clinical Social Worker [facility] [phone]

## 2019-01-16 ENCOUNTER — Other Ambulatory Visit: Payer: Self-pay

## 2019-01-16 ENCOUNTER — Inpatient Hospital Stay: Payer: BC Managed Care – PPO

## 2019-01-16 ENCOUNTER — Ambulatory Visit
Admission: RE | Admit: 2019-01-16 | Discharge: 2019-01-16 | Disposition: A | Discharge status: Home / Self Care | Payer: BC Managed Care – PPO | Source: Ambulatory Visit | Attending: Radiation Oncology | Admitting: Radiation Oncology

## 2019-01-16 VITALS — BP 130/56 | HR 67 | Temp 98.2°F | Resp 18

## 2019-01-16 DIAGNOSIS — Z9011 Acquired absence of right breast and nipple: Secondary | ICD-10-CM | POA: Diagnosis not present

## 2019-01-16 DIAGNOSIS — R911 Solitary pulmonary nodule: Secondary | ICD-10-CM | POA: Diagnosis not present

## 2019-01-16 DIAGNOSIS — C50211 Malignant neoplasm of upper-inner quadrant of right female breast: Secondary | ICD-10-CM | POA: Diagnosis not present

## 2019-01-16 DIAGNOSIS — Z510 Encounter for antineoplastic radiation therapy: Secondary | ICD-10-CM | POA: Diagnosis not present

## 2019-01-16 DIAGNOSIS — Z170 Estrogen receptor positive status [ER+]: Secondary | ICD-10-CM | POA: Diagnosis not present

## 2019-01-16 DIAGNOSIS — Z95828 Presence of other vascular implants and grafts: Secondary | ICD-10-CM

## 2019-01-16 DIAGNOSIS — Z5111 Encounter for antineoplastic chemotherapy: Secondary | ICD-10-CM | POA: Diagnosis not present

## 2019-01-16 DIAGNOSIS — C50411 Malignant neoplasm of upper-outer quadrant of right female breast: Secondary | ICD-10-CM | POA: Diagnosis not present

## 2019-01-16 DIAGNOSIS — Z79899 Other long term (current) drug therapy: Secondary | ICD-10-CM | POA: Diagnosis not present

## 2019-01-16 MED ORDER — SODIUM CHLORIDE 0.9% FLUSH
10.0000 mL | INTRAVENOUS | Status: DC | PRN
  Administered 2019-01-16: 10 mL
  Filled 2019-01-16: qty 10

## 2019-01-16 MED ORDER — HEPARIN SOD (PORK) LOCK FLUSH 100 UNIT/ML IV SOLN
500.0000 [IU] | Freq: Once | INTRAVENOUS | Status: AC | PRN
  Administered 2019-01-16: 500 [IU]
  Filled 2019-01-16: qty 5

## 2019-01-16 MED ORDER — SODIUM CHLORIDE 0.9 % IV SOLN
INTRAVENOUS | Status: DC
  Administered 2019-01-16: 08:00:00 via INTRAVENOUS
  Filled 2019-01-16 (×2): qty 250

## 2019-01-16 NOTE — Patient Instructions (Signed)
 Dehydration, Adult  Dehydration is when there is not enough fluid or water in your body. This happens when you lose more fluids than you take in. Dehydration can range from mild to very bad. It should be treated right away to keep it from getting very bad. Symptoms of mild dehydration may include:  Thirst.  Dry lips.  Slightly dry mouth.  Dry, warm skin.  Dizziness. Symptoms of moderate dehydration may include:  Very dry mouth.  Muscle cramps.  Dark pee (urine). Pee may be the color of tea.  Your body making less pee.  Your eyes making fewer tears.  Heartbeat that is uneven or faster than normal (palpitations).  Headache.  Light-headedness, especially when you stand up from sitting.  Fainting (syncope). Symptoms of very bad dehydration may include:  Changes in skin, such as: ? Cold and clammy skin. ? Blotchy (mottled) or pale skin. ? Skin that does not quickly return to normal after being lightly pinched and let go (poor skin turgor).  Changes in body fluids, such as: ? Feeling very thirsty. ? Your eyes making fewer tears. ? Not sweating when body temperature is high, such as in hot weather. ? Your body making very little pee.  Changes in vital signs, such as: ? Weak pulse. ? Pulse that is more than 100 beats a minute when you are sitting still. ? Fast breathing. ? Low blood pressure.  Other changes, such as: ? Sunken eyes. ? Cold hands and feet. ? Confusion. ? Lack of energy (lethargy). ? Trouble waking up from sleep. ? Short-term weight loss. ? Unconsciousness. Follow these instructions at home:   If told by your doctor, drink an ORS: ? Make an ORS by using instructions on the package. ? Start by drinking small amounts, about  cup (120 mL) every 5-10 minutes. ? Slowly drink more until you have had the amount that your doctor said to have.  Drink enough clear fluid to keep your pee clear or pale yellow. If you were told to drink an ORS, finish  the ORS first, then start slowly drinking clear fluids. Drink fluids such as: ? Water. Do not drink only water by itself. Doing that can make the salt (sodium) level in your body get too low (hyponatremia). ? Ice chips. ? Fruit juice that you have added water to (diluted). ? Low-calorie sports drinks.  Avoid: ? Alcohol. ? Drinks that have a lot of sugar. These include high-calorie sports drinks, fruit juice that does not have water added, and soda. ? Caffeine. ? Foods that are greasy or have a lot of fat or sugar.  Take over-the-counter and prescription medicines only as told by your doctor.  Do not take salt tablets. Doing that can make the salt level in your body get too high (hypernatremia).  Eat foods that have minerals (electrolytes). Examples include bananas, oranges, potatoes, tomatoes, and spinach.  Keep all follow-up visits as told by your doctor. This is important. Contact a doctor if:  You have belly (abdominal) pain that: ? Gets worse. ? Stays in one area (localizes).  You have a rash.  You have a stiff neck.  You get angry or annoyed more easily than normal (irritability).  You are more sleepy than normal.  You have a harder time waking up than normal.  You feel: ? Weak. ? Dizzy. ? Very thirsty.  You have peed (urinated) only a small amount of very dark pee during 6-8 hours. Get help right away if:  You   have symptoms of very bad dehydration.  You cannot drink fluids without throwing up (vomiting).  Your symptoms get worse with treatment.  You have a fever.  You have a very bad headache.  You are throwing up or having watery poop (diarrhea) and it: ? Gets worse. ? Does not go away.  You have blood or something green (bile) in your throw-up.  You have blood in your poop (stool). This may cause poop to look black and tarry.  You have not peed in 6-8 hours.  You pass out (faint).  Your heart rate when you are sitting still is more than 100  beats a minute.  You have trouble breathing. This information is not intended to replace advice given to you by your health care provider. Make sure you discuss any questions you have with your health care provider. Document Released: 04/22/2009 Document Revised: 01/14/2016 Document Reviewed: 08/20/2015 Elsevier Interactive Patient Education  2019 Elsevier Inc.  Coronavirus (COVID-19) Are you at risk?  Are you at risk for the Coronavirus (COVID-19)?  To be considered HIGH RISK for Coronavirus (COVID-19), you have to meet the following criteria:  . Traveled to [place], [place], [place], [place] or [place]; or in the [place] to [place], [place], [place], or [place]; and have fever, cough, and shortness of breath within the last 2 weeks of travel OR . Been in close contact with a person diagnosed with COVID-19 within the last 2 weeks and have fever, cough, and shortness of breath . IF YOU DO NOT MEET THESE CRITERIA, YOU ARE CONSIDERED LOW RISK FOR COVID-19.  What to do if you are HIGH RISK for COVID-19?  . If you are having a medical emergency, call 911. . Seek medical care right away. Before you go to a doctor's office, urgent care or emergency department, call ahead and tell them about your recent travel, contact with someone diagnosed with COVID-19, and your symptoms. You should receive instructions from your physician's office regarding next steps of care.  . When you arrive at healthcare provider, tell the healthcare staff immediately you have returned from visiting [place], [place], [place], [place] or [place]; or traveled in the [place] to [place], [place], [place], or [place]; in the last two weeks or you have been in close contact with a person diagnosed with COVID-19 in the last 2 weeks.   . Tell the health care staff about your symptoms: fever, cough and shortness of breath. . After you have been seen by a medical provider, you will be either: o Tested for  (COVID-19) and discharged home on quarantine except to seek medical care if symptoms worsen, and asked to  - Stay home and avoid contact with others until you get your results (4-5 days)  - Avoid travel on public transportation if possible (such as bus, train, or airplane) or o Sent to the Emergency Department by EMS for evaluation, COVID-19 testing, and possible admission depending on your condition and test results.  What to do if you are LOW RISK for COVID-19?  Reduce your risk of any infection by using the same precautions used for avoiding the common cold or flu:  . Wash your hands often with soap and warm water for at least 20 seconds.  If soap and water are not readily available, use an alcohol-based hand sanitizer with at least 60% alcohol.  . If coughing or sneezing, cover your mouth and nose by coughing or sneezing into the elbow areas   of your shirt or coat, into a tissue or into your sleeve (not your hands). . Avoid shaking hands with others and consider head nods or verbal greetings only. . Avoid touching your eyes, nose, or mouth with unwashed hands.  . Avoid close contact with people who are sick. . Avoid places or events with large numbers of people in one location, like concerts or sporting events. . Carefully consider travel plans you have or are making. . If you are planning any travel outside or inside the [place], visit the CDC's Travelers' Health webpage for the latest health notices. . If you have some symptoms but not all symptoms, continue to monitor at home and seek medical attention if your symptoms worsen. . If you are having a medical emergency, call 911.   ADDITIONAL HEALTHCARE OPTIONS FOR PATIENTS  [facility] / e-Visit: [email or web address]         [facility]: [phone]  [facility]: [phone]                   [facility]: [phone]  

## 2019-01-17 ENCOUNTER — Other Ambulatory Visit: Payer: Self-pay

## 2019-01-17 ENCOUNTER — Ambulatory Visit
Admission: RE | Admit: 2019-01-17 | Discharge: 2019-01-17 | Disposition: A | Discharge status: Home / Self Care | Payer: BC Managed Care – PPO | Source: Ambulatory Visit | Attending: Radiation Oncology | Admitting: Radiation Oncology

## 2019-01-17 DIAGNOSIS — C50411 Malignant neoplasm of upper-outer quadrant of right female breast: Secondary | ICD-10-CM | POA: Diagnosis not present

## 2019-01-17 DIAGNOSIS — Z510 Encounter for antineoplastic radiation therapy: Secondary | ICD-10-CM | POA: Diagnosis not present

## 2019-01-17 DIAGNOSIS — Z170 Estrogen receptor positive status [ER+]: Secondary | ICD-10-CM | POA: Diagnosis not present

## 2019-01-18 ENCOUNTER — Other Ambulatory Visit: Payer: Self-pay | Admitting: Gastroenterology

## 2019-01-20 ENCOUNTER — Other Ambulatory Visit: Payer: Self-pay

## 2019-01-20 ENCOUNTER — Ambulatory Visit
Admission: RE | Admit: 2019-01-20 | Discharge: 2019-01-20 | Disposition: A | Discharge status: Home / Self Care | Payer: BC Managed Care – PPO | Source: Ambulatory Visit | Attending: Radiation Oncology | Admitting: Radiation Oncology

## 2019-01-20 DIAGNOSIS — Z170 Estrogen receptor positive status [ER+]: Secondary | ICD-10-CM | POA: Diagnosis not present

## 2019-01-20 DIAGNOSIS — C50411 Malignant neoplasm of upper-outer quadrant of right female breast: Secondary | ICD-10-CM | POA: Diagnosis not present

## 2019-01-20 DIAGNOSIS — Z510 Encounter for antineoplastic radiation therapy: Secondary | ICD-10-CM | POA: Diagnosis not present

## 2019-01-21 ENCOUNTER — Ambulatory Visit
Admission: RE | Admit: 2019-01-21 | Discharge: 2019-01-21 | Disposition: A | Discharge status: Home / Self Care | Payer: BC Managed Care – PPO | Source: Ambulatory Visit | Attending: Radiation Oncology | Admitting: Radiation Oncology

## 2019-01-21 ENCOUNTER — Other Ambulatory Visit: Payer: Self-pay

## 2019-01-21 DIAGNOSIS — Z170 Estrogen receptor positive status [ER+]: Secondary | ICD-10-CM | POA: Diagnosis not present

## 2019-01-21 DIAGNOSIS — Z510 Encounter for antineoplastic radiation therapy: Secondary | ICD-10-CM | POA: Diagnosis not present

## 2019-01-21 DIAGNOSIS — C50411 Malignant neoplasm of upper-outer quadrant of right female breast: Secondary | ICD-10-CM | POA: Diagnosis not present

## 2019-01-22 ENCOUNTER — Ambulatory Visit
Admission: RE | Admit: 2019-01-22 | Discharge: 2019-01-22 | Disposition: A | Discharge status: Home / Self Care | Payer: BC Managed Care – PPO | Source: Ambulatory Visit | Attending: Radiation Oncology | Admitting: Radiation Oncology

## 2019-01-22 DIAGNOSIS — Z510 Encounter for antineoplastic radiation therapy: Secondary | ICD-10-CM | POA: Diagnosis not present

## 2019-01-22 DIAGNOSIS — C50411 Malignant neoplasm of upper-outer quadrant of right female breast: Secondary | ICD-10-CM | POA: Diagnosis not present

## 2019-01-22 DIAGNOSIS — Z170 Estrogen receptor positive status [ER+]: Secondary | ICD-10-CM | POA: Diagnosis not present

## 2019-01-23 ENCOUNTER — Ambulatory Visit
Admission: RE | Admit: 2019-01-23 | Discharge: 2019-01-23 | Disposition: A | Discharge status: Home / Self Care | Payer: BC Managed Care – PPO | Source: Ambulatory Visit | Attending: Radiation Oncology | Admitting: Radiation Oncology

## 2019-01-23 ENCOUNTER — Other Ambulatory Visit: Payer: Self-pay

## 2019-01-23 DIAGNOSIS — Z510 Encounter for antineoplastic radiation therapy: Secondary | ICD-10-CM | POA: Diagnosis not present

## 2019-01-23 DIAGNOSIS — C50411 Malignant neoplasm of upper-outer quadrant of right female breast: Secondary | ICD-10-CM | POA: Diagnosis not present

## 2019-01-23 DIAGNOSIS — Z170 Estrogen receptor positive status [ER+]: Secondary | ICD-10-CM | POA: Diagnosis not present

## 2019-01-24 ENCOUNTER — Other Ambulatory Visit: Payer: Self-pay

## 2019-01-24 ENCOUNTER — Ambulatory Visit
Admission: RE | Admit: 2019-01-24 | Discharge: 2019-01-24 | Disposition: A | Discharge status: Home / Self Care | Payer: BC Managed Care – PPO | Source: Ambulatory Visit | Attending: Radiation Oncology | Admitting: Radiation Oncology

## 2019-01-24 DIAGNOSIS — Z510 Encounter for antineoplastic radiation therapy: Secondary | ICD-10-CM | POA: Diagnosis not present

## 2019-01-24 DIAGNOSIS — Z170 Estrogen receptor positive status [ER+]: Secondary | ICD-10-CM | POA: Diagnosis not present

## 2019-01-24 DIAGNOSIS — C50411 Malignant neoplasm of upper-outer quadrant of right female breast: Secondary | ICD-10-CM | POA: Diagnosis not present

## 2019-01-27 ENCOUNTER — Ambulatory Visit
Admission: RE | Admit: 2019-01-27 | Discharge: 2019-01-27 | Disposition: A | Discharge status: Home / Self Care | Payer: BC Managed Care – PPO | Source: Ambulatory Visit | Attending: Radiation Oncology | Admitting: Radiation Oncology

## 2019-01-27 ENCOUNTER — Other Ambulatory Visit: Payer: Self-pay

## 2019-01-27 DIAGNOSIS — Z510 Encounter for antineoplastic radiation therapy: Secondary | ICD-10-CM | POA: Diagnosis not present

## 2019-01-27 DIAGNOSIS — C50411 Malignant neoplasm of upper-outer quadrant of right female breast: Secondary | ICD-10-CM | POA: Diagnosis not present

## 2019-01-27 DIAGNOSIS — Z170 Estrogen receptor positive status [ER+]: Secondary | ICD-10-CM | POA: Diagnosis not present

## 2019-01-28 ENCOUNTER — Other Ambulatory Visit: Payer: Self-pay

## 2019-01-28 ENCOUNTER — Inpatient Hospital Stay: Payer: BC Managed Care – PPO

## 2019-01-28 ENCOUNTER — Encounter: Payer: Self-pay | Admitting: Adult Health

## 2019-01-28 ENCOUNTER — Inpatient Hospital Stay (HOSPITAL_BASED_OUTPATIENT_CLINIC_OR_DEPARTMENT_OTHER): Payer: BC Managed Care – PPO | Admitting: Adult Health

## 2019-01-28 ENCOUNTER — Ambulatory Visit
Admission: RE | Admit: 2019-01-28 | Discharge: 2019-01-28 | Disposition: A | Discharge status: Home / Self Care | Payer: BC Managed Care – PPO | Source: Ambulatory Visit | Attending: Radiation Oncology | Admitting: Radiation Oncology

## 2019-01-28 ENCOUNTER — Encounter: Payer: Self-pay | Admitting: *Deleted

## 2019-01-28 VITALS — BP 115/69 | HR 72 | Temp 98.5°F | Resp 18 | Ht 64.0 in | Wt 212.6 lb

## 2019-01-28 DIAGNOSIS — C50211 Malignant neoplasm of upper-inner quadrant of right female breast: Secondary | ICD-10-CM | POA: Diagnosis not present

## 2019-01-28 DIAGNOSIS — R911 Solitary pulmonary nodule: Secondary | ICD-10-CM | POA: Diagnosis not present

## 2019-01-28 DIAGNOSIS — C50411 Malignant neoplasm of upper-outer quadrant of right female breast: Secondary | ICD-10-CM

## 2019-01-28 DIAGNOSIS — Z79899 Other long term (current) drug therapy: Secondary | ICD-10-CM

## 2019-01-28 DIAGNOSIS — Z170 Estrogen receptor positive status [ER+]: Secondary | ICD-10-CM

## 2019-01-28 DIAGNOSIS — Z9011 Acquired absence of right breast and nipple: Secondary | ICD-10-CM

## 2019-01-28 DIAGNOSIS — Z95828 Presence of other vascular implants and grafts: Secondary | ICD-10-CM

## 2019-01-28 DIAGNOSIS — Z510 Encounter for antineoplastic radiation therapy: Secondary | ICD-10-CM | POA: Diagnosis not present

## 2019-01-28 DIAGNOSIS — Z5111 Encounter for antineoplastic chemotherapy: Secondary | ICD-10-CM | POA: Diagnosis not present

## 2019-01-28 LAB — COMPREHENSIVE METABOLIC PANEL
ALT: 10 U/L (ref 0–44)
AST: 16 U/L (ref 15–41)
Albumin: 3.3 g/dL — ABNORMAL LOW (ref 3.5–5.0)
Alkaline Phosphatase: 102 U/L (ref 38–126)
Anion gap: 10 (ref 5–15)
BUN: 10 mg/dL (ref 8–23)
CO2: 26 mmol/L (ref 22–32)
Calcium: 8.8 mg/dL — ABNORMAL LOW (ref 8.9–10.3)
Chloride: 108 mmol/L (ref 98–111)
Creatinine, Ser: 0.83 mg/dL (ref 0.44–1.00)
GFR calc Af Amer: >60 mL/min (ref >60)
GFR calc non Af Amer: >60 mL/min (ref >60)
Glucose, Bld: 88 mg/dL (ref 70–99)
Potassium: 4.6 mmol/L (ref 3.5–5.1)
Sodium: 144 mmol/L (ref 135–145)
Total Bilirubin: 0.4 mg/dL (ref 0.3–1.2)
Total Protein: 6.5 g/dL (ref 6.5–8.1)

## 2019-01-28 LAB — CBC WITH DIFFERENTIAL/PLATELET
Abs Immature Granulocytes: 0.13 10*3/uL — ABNORMAL HIGH (ref 0.00–0.07)
Basophils Absolute: 0.1 10*3/uL (ref 0.0–0.1)
Basophils Relative: 1 %
Eosinophils Absolute: 0.3 10*3/uL (ref 0.0–0.5)
Eosinophils Relative: 5 %
HCT: 33.4 % — ABNORMAL LOW (ref 36.0–46.0)
Hemoglobin: 10.7 g/dL — ABNORMAL LOW (ref 12.0–15.0)
Immature Granulocytes: 2 %
Lymphocytes Relative: 8 %
Lymphs Abs: 0.4 10*3/uL — ABNORMAL LOW (ref 0.7–4.0)
MCH: 29.7 pg (ref 26.0–34.0)
MCHC: 32 g/dL (ref 30.0–36.0)
MCV: 92.8 fL (ref 80.0–100.0)
Monocytes Absolute: 1.1 10*3/uL — ABNORMAL HIGH (ref 0.1–1.0)
Monocytes Relative: 19 %
Neutro Abs: 3.6 10*3/uL (ref 1.7–7.7)
Neutrophils Relative %: 65 %
Platelets: 178 10*3/uL (ref 150–400)
RBC: 3.6 MIL/uL — ABNORMAL LOW (ref 3.87–5.11)
RDW: 17.9 % — ABNORMAL HIGH (ref 11.5–15.5)
WBC: 5.6 10*3/uL (ref 4.0–10.5)
nRBC: 0 % (ref 0.0–0.2)

## 2019-01-28 MED ORDER — DEXAMETHASONE SODIUM PHOSPHATE 10 MG/ML IJ SOLN
INTRAMUSCULAR | Status: AC
  Filled 2019-01-28: qty 1

## 2019-01-28 MED ORDER — PALONOSETRON HCL INJECTION 0.25 MG/5ML
0.2500 mg | Freq: Once | INTRAVENOUS | Status: AC
  Administered 2019-01-28: 0.25 mg via INTRAVENOUS

## 2019-01-28 MED ORDER — SODIUM CHLORIDE 0.9 % IV SOLN
600.0000 mg/m2 | Freq: Once | INTRAVENOUS | Status: AC
  Administered 2019-01-28: 11:00:00 1260 mg via INTRAVENOUS
  Filled 2019-01-28: qty 63

## 2019-01-28 MED ORDER — PALONOSETRON HCL INJECTION 0.25 MG/5ML
INTRAVENOUS | Status: AC
  Filled 2019-01-28: qty 5

## 2019-01-28 MED ORDER — HEPARIN SOD (PORK) LOCK FLUSH 100 UNIT/ML IV SOLN
500.0000 [IU] | Freq: Once | INTRAVENOUS | Status: AC | PRN
  Administered 2019-01-28: 13:00:00 500 [IU]
  Filled 2019-01-28: qty 5

## 2019-01-28 MED ORDER — SODIUM CHLORIDE 0.9% FLUSH
10.0000 mL | INTRAVENOUS | Status: DC | PRN
  Administered 2019-01-28: 10 mL
  Filled 2019-01-28: qty 10

## 2019-01-28 MED ORDER — DEXAMETHASONE SODIUM PHOSPHATE 10 MG/ML IJ SOLN
10.0000 mg | Freq: Once | INTRAMUSCULAR | Status: AC
  Administered 2019-01-28: 10:00:00 10 mg via INTRAVENOUS

## 2019-01-28 MED ORDER — SODIUM CHLORIDE 0.9 % IV SOLN
Freq: Once | INTRAVENOUS | Status: AC
  Administered 2019-01-28: 10:00:00 via INTRAVENOUS
  Filled 2019-01-28: qty 250

## 2019-01-28 MED ORDER — SODIUM CHLORIDE 0.9% FLUSH
10.0000 mL | INTRAVENOUS | Status: DC | PRN
  Administered 2019-01-28: 09:00:00 10 mL
  Filled 2019-01-28: qty 10

## 2019-01-28 MED ORDER — FLUOROURACIL CHEMO INJECTION 2.5 GM/50ML
600.0000 mg/m2 | Freq: Once | INTRAVENOUS | Status: AC
  Administered 2019-01-28: 12:00:00 1250 mg via INTRAVENOUS
  Filled 2019-01-28: qty 25

## 2019-01-28 NOTE — Progress Notes (Signed)
[facility]  [phone] [phone]     ID: Doris Smith DOB: February 13, 1951  MR#: [id number]  KDT#:[id number]  Patient Care Team: Chevis Pretty, FNP as PCP - General (Nurse Practitioner) Alphonsa Overall, MD as Consulting Physician (General Surgery) Magrinat, Virgie Dad, MD as Consulting Physician (Oncology) Kyung Rudd, MD as Consulting Physician (Radiation Oncology) Elijio Miles, MD as Consulting Physician (Pulmonary Disease) Netta Cedars, MD as Consulting Physician (Orthopedic Surgery) Susa Day, MD as Consulting Physician (Orthopedic Surgery) Janeth Rase, NP as Nurse Practitioner (Adult Health Nurse Practitioner) Mauro Kaufmann, RN as Oncology Nurse Navigator Rockwell Germany, RN as Oncology Nurse Navigator Irene Limbo, MD as Consulting Physician (Plastic Surgery) Scot Dock, NP OTHER MD: Chapman Moss, psychiatry   CHIEF COMPLAINT: Estrogen receptor positive breast cancer  CURRENT TREATMENT: Adjuvant chemotherapy   INTERVAL HISTORY: Doris Smith is seen today for follow-up and treatment of her estrogen receptor positive breast cancer.  She continues on cyclophosphamide, methotrexate, fluorouracil (CMF), to be repeated every 21 days x 8. Today is day 1 cycle 5.  We are holding the Methotrexate while she is undergoing adjuvant radiation.   REVIEW OF SYSTEMS: Doris Smith is doing moderately well.  She is undergoing radiation.  She notes her skin is getting red at her radiation sites.  She is applying the cream given to her by radiation daily.  She notes her radiation techs are very encouraging to her and are of great help in her getting through everything.  She remains fatigued.  She notes she has mild nausea, but this is not as bad as prior.  She does have loose bowel movements on day of chemo (she has h/o IBS).  She wears depends and imodium helps with this.    Doris Smith notes that she has some medications and she doesn't  know what they are for and when to take them.  She didn't bring them today.  Otherwise, Doris Smith is doing well.  A detailed ROS was non contributory.     HISTORY OF CURRENT ILLNESS: "Doris Smith" presented to her PCP, Dr. Hassell Done, with right breast pain, fullness, and nipple inversion since November 2019. She proceeded to undergo bilateral diagnostic mammography with tomography and right breast ultrasonography at [facility] on 07/31/2018 showing: findings compatible with multicentric breast cancer spanning at least the upper inner and upper outer quadrants of the right breast; normal right axilla. Palpable firmness of approximately 1.5 cm was noted in the 9 o'clock position, which was also seen on ultrasound with indisctinct margins measuring 2.7 x 2.3 x 2.2 cm. At 1 o'clock, a mass with poorly defined margins measures 1.9 x 1 x 0.8 cm. Additional poorly defined masses were seen in the 12 o'clock retroareolar region.  Accordingly on 08/02/2018 she proceeded to biopsy of the right breast area in question. The pathology ([id number]) from this procedure showed: invasive mammary carcinoma at 9 o'clock and 1 o'clock; e-cadherin negative, consistent with lobular phenotype; grade 2-3. Prognostic indicators significant for: estrogen receptor, 90% positive and progesterone receptor, 1% positive, both with strong staining intensity. Proliferation marker Ki67 at 1%. HER2 equivocal by immunohistochemistry, 2+ but negative by fluorescent in situ hybridization with a signals ratio 1.04 and number per cell 1.2.   The patient's subsequent history is as detailed below.   PAST MEDICAL HISTORY: Past Medical History:  Diagnosis Date   A-fib ([place]) 11/2012   PAF   Anxiety    takes Ativan   Asthma 04/07/2011   dx   Bipolar disorder ([place])  Cancer [facility])    Right breast   Depression    Dyspnea    DOE   Early cataracts, bilateral    Fatty liver    Fibromyalgia    Fracture, ankle 10/2008   right and left     GERD (gastroesophageal reflux disease)    Irritable bowel syndrome    Mental disorder    dx bipolar   PONV (postoperative nausea and vomiting)    Sleep apnea 04/2011    does not wear CPAP   Wears glasses   She has chronic sinus headaches, hiatal hernia   PAST SURGICAL HISTORY: Past Surgical History:  Procedure Laterality Date   ABDOMINAL HYSTERECTOMY     COLONOSCOPY     DILATION AND CURETTAGE OF UTERUS  1981   abnormal pap   KNEE ARTHROSCOPY Left 2007   x 2   LUMBAR LAMINECTOMY/DECOMPRESSION MICRODISCECTOMY  05/31/2011   Procedure: LUMBAR LAMINECTOMY/DECOMPRESSION MICRODISCECTOMY;  Surgeon: Johnn Hai;  Location: [facility];  Service: Orthopedics;  Laterality: Left;  Decompression Lumbar four to five and  Lumbar five to Sacral one on Left  (X-Ray)   MASTECTOMY W/ SENTINEL NODE BIOPSY Right 09/16/2018   Procedure: RIGHT MASTECTOMY WITH RIGHT AXILLARY SENTINEL LYMPH NODE BIOPSY;  Surgeon: Alphonsa Overall, MD;  Location: [facility];  Service: General;  Laterality: Right;   PARTIAL HYSTERECTOMY  1982   PORTACATH PLACEMENT Left 10/31/2018   Procedure: INSERTION PORT-A-CATH WITH ULTRASOUND;  Surgeon: Alphonsa Overall, MD;  Location: [facility];  Service: General;  Laterality: Left;   TONSILLECTOMY     as child   TOTAL KNEE ARTHROPLASTY Left 12/18/2005    FAMILY HISTORY Family History  Problem Relation Age of Onset   Anesthesia problems Mother    Heart disease Mother        CHF, atrial fib   Heart failure Mother    Anesthesia problems Sister    Colon polyps Father    Heart disease Father        "Fluid around the heart"   Hypertension Sister    Breast cancer Maternal Aunt    Colon cancer Maternal Aunt    Prostate cancer Neg Hx    Ovarian cancer Neg Hx    As of February 2019, patient father is alive at 10 years old. Patient mother is alive at 67 years old. She notes a family hx of breast cancer. A maternal aunt was diagnosed with breast cancer,  but Doris Smith is unsure if it was in one or both breasts. She has 3 siblings, 3 sisters and 0 brothers.   GYNECOLOGIC HISTORY:  No LMP recorded. Patient is postmenopausal. Menarche: 68 years old Age at first live birth: 68 years old [place] P 2 LMP about 42 years ago Contraceptive no HRT no  Hysterectomy? partial So? no   SOCIAL HISTORY: Doris Smith worked in [employer] for 15 years. She is on disability because her back, knees, and nerves. Her husband, [name], has been at [employer] 43 years as a Freight forwarder.  Even though their home is in [place] they are actually living in [place] in an apartment while her husband works there.  Son Legrand Como, age 54, works as a Chief Strategy Officer in [place], [place]. Son Shanon Brow, age 82, works as a Building control surveyor in [place], [place]. They have 4 grandchildren, 2 great-grandchildren with 2 more on the way. She attends a [employer].     ADVANCED DIRECTIVES:    HEALTH MAINTENANCE: Social History   Tobacco Use   Smoking status:  Never Smoker   Smokeless tobacco: Never Used  Substance Use Topics   Alcohol use: No   Drug use: No     Colonoscopy: 2019  PAP: 11/03/2014, normal  Bone density: 10/13/2016, T-score of -2.1   Allergies  Allergen Reactions   Iohexol     "over 20 years ago" had reaction "hurting in arm and heart"-Done in [place], [place] Kitchenphysician stopped CT at that time.   Codeine Other (See Comments)    Makes feel like she is wild    Prednisone Other (See Comments)    Makes me very irritable   Sulfonamide Derivatives Other (See Comments)    Upset stomach   Celecoxib Nausea And Vomiting   Sulfa Antibiotics Nausea And Vomiting   Vitamin B12 Rash    Current Outpatient Medications  Medication Sig Dispense Refill   anastrozole (ARIMIDEX) 1 MG tablet      benzonatate (TESSALON PERLES) 100 MG capsule Take 1 capsule (100 mg total) by mouth 3 (three) times daily as needed for cough. 20 capsule 0   budesonide-formoterol (SYMBICORT) 160-4.5 MCG/ACT inhaler  Inhale 2 puffs into the lungs 2 (two) times daily.     ciprofloxacin (CIPRO) 500 MG tablet Take 1 tablet (500 mg total) by mouth 2 (two) times daily. 14 tablet 0   clotrimazole-betamethasone (LOTRISONE) cream Apply 1 application topically 2 (two) times daily. (Patient taking differently: Apply 1 application topically 2 (two) times daily as needed (rash). ) 30 g 0   dexamethasone (DECADRON) 4 MG tablet Take 1 tablet (4 mg total) by mouth daily. Start the day after chemotherapy for 2 days. Take with food. 30 tablet 1   esomeprazole (NEXIUM) 40 MG capsule TAKE 1 CAPSULE BY MOUTH ONCE DAILY BEFORE BREAKFAST 30 capsule 0   fluticasone (FLONASE) 50 MCG/ACT nasal spray Place 2 sprays into both nostrils daily. 16 g 6   fluticasone furoate-vilanterol (BREO ELLIPTA) 200-25 MCG/INH AEPB Inhale 1 puff into the lungs daily.     gabapentin (NEURONTIN) 300 MG capsule Take 1 capsule (300 mg total) by mouth 3 (three) times daily. (Patient taking differently: Take 300 mg by mouth 2 (two) times daily. ) 90 capsule 5   ibuprofen (ADVIL) 200 MG tablet Take 200 mg by mouth every 6 (six) hours as needed.     lamoTRIgine (LAMICTAL) 100 MG tablet Take 100 mg by mouth 2 (two) times daily.      lidocaine-prilocaine (EMLA) cream Apply to affected area once 30 g 3   LORazepam (ATIVAN) 0.5 MG tablet Take 1 tablet (0.5 mg total) by mouth at bedtime as needed (Nausea or vomiting). 30 tablet 0   magic mouthwash SOLN Take 5 mLs by mouth 4 (four) times daily as needed for mouth pain. 240 mL 0   montelukast (SINGULAIR) 10 MG tablet Take 10 mg by mouth at bedtime.     omeprazole (PRILOSEC) 20 MG capsule Take 1 capsule (20 mg total) by mouth daily. 30 capsule 0   prochlorperazine (COMPAZINE) 10 MG tablet Take 1 tablet (10 mg total) by mouth every 6 (six) hours as needed (Nausea or vomiting). 30 tablet 1   Vilazodone HCl (VIIBRYD) 40 MG TABS Take 40 mg by mouth daily.     No current facility-administered medications for  this visit.     OBJECTIVE: Vitals:   01/28/19 0918  BP: 115/69  Pulse: 72  Resp: 18  Temp: 98.5 F (36.9 C)  SpO2: 100%     Body mass index is 36.49 kg/m.   Wt Readings  from Last 3 Encounters:  01/28/19 212 lb 9.6 oz (96.4 kg)  01/15/19 209 lb 3.2 oz (94.9 kg)  01/07/19 212 lb 4.8 oz (96.3 kg)  ECOG FS:1 - Symptomatic but completely ambulatory GENERAL: Patient is a well appearing female in no acute distress HEENT:  Sclerae anicteric.  Oropharynx clear and moist. No ulcerations or evidence of oropharyngeal candidiasis. Neck is supple.  NODES:  No cervical, supraclavicular, or axillary lymphadenopathy palpated.  BREAST EXAM:  Erythema noted over right chest wall, no desquamation LUNGS:  Clear to auscultation bilaterally.  No wheezes or rhonchi. HEART:  Regular rate and rhythm. No murmur appreciated. ABDOMEN:  Soft, nontender.  Positive, normoactive bowel sounds. No organomegaly palpated. MSK:  No focal spinal tenderness to palpation. Full range of motion bilaterally in the upper extremities. EXTREMITIES:  No peripheral edema.   SKIN:  Clear with no obvious rashes or skin changes. No nail dyscrasia. NEURO:  Nonfocal. Well oriented.  Appropriate affect.       LAB RESULTS:  CMP     Component Value Date/Time   NA 143 01/15/2019 0959   NA 142 10/13/2016 1045   K 3.7 01/15/2019 0959   CL 107 01/15/2019 0959   CO2 29 01/15/2019 0959   GLUCOSE 102 (H) 01/15/2019 0959   BUN 10 01/15/2019 0959   BUN 8 10/13/2016 1045   CREATININE 0.82 01/15/2019 0959   CREATININE 0.90 12/19/2018 1434   CALCIUM 8.1 (L) 01/15/2019 0959   PROT 6.3 (L) 01/15/2019 0959   PROT 6.3 10/13/2016 1045   ALBUMIN 3.2 (L) 01/15/2019 0959   ALBUMIN 3.9 10/13/2016 1045   AST 12 (L) 01/15/2019 0959   AST 14 (L) 08/14/2018 0758   ALT 10 01/15/2019 0959   ALT 10 08/14/2018 0758   ALKPHOS 98 01/15/2019 0959   BILITOT 0.4 01/15/2019 0959   BILITOT 0.5 08/14/2018 0758   GFRNONAA >60 01/15/2019 0959    GFRNONAA >60 12/19/2018 1434   GFRAA >60 01/15/2019 0959   GFRAA >60 12/19/2018 1434    No results found for: TOTALPROTELP, ALBUMINELP, A1GS, A2GS, BETS, BETA2SER, GAMS, MSPIKE, SPEI  No results found for: KPAFRELGTCHN, LAMBDASER, KAPLAMBRATIO  Lab Results  Component Value Date   WBC 5.6 01/28/2019   NEUTROABS 3.6 01/28/2019   HGB 10.7 (L) 01/28/2019   HCT 33.4 (L) 01/28/2019   MCV 92.8 01/28/2019   PLT 178 01/28/2019    _0 @  No results found for: LABCA2  No components found for: [id number]  No results for input(s): INR in the last 168 hours.  No results found for: LABCA2  No results found for: PHX505  No results found for: WPV948  No results found for: AXK553  No results found for: CA2729  No components found for: HGQUANT  No results found for: CEA1 / No results found for: CEA1   No results found for: AFPTUMOR  No results found for: [place]  No results found for: PSA1  Appointment on 01/28/2019  Component Date Value Ref Range Status   WBC 01/28/2019 5.6  4.0 - 10.5 K/uL Final   RBC 01/28/2019 3.60* 3.87 - 5.11 MIL/uL Final   Hemoglobin 01/28/2019 10.7* 12.0 - 15.0 g/dL Final   HCT 01/28/2019 33.4* 36.0 - 46.0 % Final   MCV 01/28/2019 92.8  80.0 - 100.0 fL Final   MCH 01/28/2019 29.7  26.0 - 34.0 pg Final   MCHC 01/28/2019 32.0  30.0 - 36.0 g/dL Final   RDW 01/28/2019 17.9* 11.5 - 15.5 % Final   Platelets  01/28/2019 178  150 - 400 K/uL Final   nRBC 01/28/2019 0.0  0.0 - 0.2 % Final   Neutrophils Relative % 01/28/2019 65  % Final   Neutro Abs 01/28/2019 3.6  1.7 - 7.7 K/uL Final   Lymphocytes Relative 01/28/2019 8  % Final   Lymphs Abs 01/28/2019 0.4* 0.7 - 4.0 K/uL Final   Monocytes Relative 01/28/2019 19  % Final   Monocytes Absolute 01/28/2019 1.1* 0.1 - 1.0 K/uL Final   Eosinophils Relative 01/28/2019 5  % Final   Eosinophils Absolute 01/28/2019 0.3  0.0 - 0.5 K/uL Final   Basophils Relative 01/28/2019 1  % Final     Basophils Absolute 01/28/2019 0.1  0.0 - 0.1 K/uL Final   Immature Granulocytes 01/28/2019 2  % Final   Abs Immature Granulocytes 01/28/2019 0.13* 0.00 - 0.07 K/uL Final   Performed at [facility], [address] [place],  [address]    (this displays the last labs from the last 3 days)  No results found for: TOTALPROTELP, ALBUMINELP, A1GS, A2GS, BETS, BETA2SER, GAMS, MSPIKE, SPEI (this displays SPEP labs)  No results found for: KPAFRELGTCHN, LAMBDASER, KAPLAMBRATIO (kappa/lambda light chains)  No results found for: HGBA, HGBA2QUANT, HGBFQUANT, HGBSQUAN (Hemoglobinopathy evaluation)   No results found for: LDH  No results found for: IRON, TIBC, IRONPCTSAT (Iron and TIBC)  No results found for: FERRITIN  Urinalysis    Component Value Date/Time   COLORURINE YELLOW 12/19/2018 1355   APPEARANCEUR HAZY (A) 12/19/2018 1355   APPEARANCEUR Clear 05/03/2018 1204   LABSPEC 1.016 12/19/2018 1355   PHURINE 5.0 12/19/2018 [place] 12/19/2018 [place] 12/19/2018 [place] 12/19/2018 1355   BILIRUBINUR Negative 05/03/2018 1204   KETONESUR NEGATIVE 12/19/2018 1355   PROTEINUR NEGATIVE 12/19/2018 1355   UROBILINOGEN negative 11/03/2014 1039   UROBILINOGEN 0.2 05/25/2011 0923   NITRITE NEGATIVE 12/19/2018 1355   LEUKOCYTESUR SMALL (A) 12/19/2018 1355     STUDIES: No results found.  ELIGIBLE FOR AVAILABLE RESEARCH PROTOCOL: no   ASSESSMENT: 68 y.o. [place], [place] woman status post right breast biopsy 08/02/2018 for a clinical T3 N0, stage IIA invasive lobular carcinoma, grade 2, estrogen receptor strongly positive, progesterone receptor 1% positive, with no HER-2 amplification and an MIB-1 of 1%.  (a) CT scan of the head and chest, without contrast 08/23/2018 showed nonspecific 0.4 cm left lower lobe pulmonary nodule, no definitive metastatic disease  (b) bone scan 08/23/2018 shows multiple spinal  areas of abnormal uptake, but  (c) total spinal MRI 09/03/2018 finds bone scan findings to be secondary to degenerative disease, no evidence of metastatic disease.  (1) status post right mastectomy and sentinel lymph node sampling 09/16/2018 showing a pT3 pN1(mic), stage IIA-IIIA invasive lobular breast cancer, grade 2, with 1 of 5 sampled lymph nodes involved by micrometastatic deposit, with extra nodular extension; ample margins  (2)  MammaPrint "high risk" suggests a 5-year metastasis free survival of 93% with chemotherapy, with an absolute chemotherapy benefit in the greater than 12% range  (3) adjuvant radiation to follow (can be done concurrently with chemotherapy)  (4) anastrozole started neoadjuvantly (on 08/14/2018), discontinued 10/14/2018 in preparation for chemotherapy  (5) Caris requested 10/14/2018  (6) adjuvant chemotherapy consisting of cyclophosphamide, methotrexate, fluorouracil (CMF) started 11/05/2018, to be repeated every 21 days x 8   PLAN: Doris Smith is doing well today.  Her labs are stable and she is tolerating radiation and CMF moderately well.  Her mood  is much improved today, and I think the encouragement from the radiation team is really helping her.  Her labs are stable and she will proceed with her fifth cycle today.  She is exercising by walking daily.  This is helping with her fatigue.  I recommended she continue this.    Doris Smith will bring in her medications that she is unsure about when to start at her next visit.    Doris Smith will continue on radiation daily.  She will return in 3 weeks for labs, f/u, and her next treatment.    A total of (20) minutes of face-to-face time was spent with this patient with greater than 50% of that time in counseling and care-coordination.   Wilber Bihari, NP  01/28/19 9:22 AM Medical Oncology and Hematology [facility] [address] [place], [place] [address] Tel. [phone]    Fax. [phone]

## 2019-01-28 NOTE — Progress Notes (Signed)
Complaining of headache after Cytoxan given. She said she normally has a headache after infusion and takes tylenol. Notified Hinda Lenis, RN in Dr. Virgie Dad office and ask if tylenol could be added to treatment plan.

## 2019-01-28 NOTE — Patient Instructions (Signed)
Page Discharge Instructions for Patients Receiving Chemotherapy  Today you received the following chemotherapy agents: Cytoxan and 5 FU.  To help prevent nausea and vomiting after your treatment, we encourage you to take your nausea medication as directed  If you develop nausea and vomiting that is not controlled by your nausea medication, call the clinic.   BELOW ARE SYMPTOMS THAT SHOULD BE REPORTED IMMEDIATELY:  *FEVER GREATER THAN 100.5 F  *CHILLS WITH OR WITHOUT FEVER  NAUSEA AND VOMITING THAT IS NOT CONTROLLED WITH YOUR NAUSEA MEDICATION  *UNUSUAL SHORTNESS OF BREATH  *UNUSUAL BRUISING OR BLEEDING  TENDERNESS IN MOUTH AND THROAT WITH OR WITHOUT PRESENCE OF ULCERS  *URINARY PROBLEMS  *BOWEL PROBLEMS  UNUSUAL RASH Items with * indicate a potential emergency and should be followed up as soon as possible.  Feel free to call the clinic should you have any questions or concerns. The clinic phone number is [phone].  Please show the [place] at check-in to the Emergency Department and triage nurse.

## 2019-01-29 ENCOUNTER — Ambulatory Visit
Admission: RE | Admit: 2019-01-29 | Discharge: 2019-01-29 | Disposition: A | Discharge status: Home / Self Care | Payer: BC Managed Care – PPO | Source: Ambulatory Visit | Attending: Radiation Oncology | Admitting: Radiation Oncology

## 2019-01-29 ENCOUNTER — Other Ambulatory Visit: Payer: Self-pay

## 2019-01-29 DIAGNOSIS — C50411 Malignant neoplasm of upper-outer quadrant of right female breast: Secondary | ICD-10-CM | POA: Diagnosis not present

## 2019-01-29 DIAGNOSIS — Z510 Encounter for antineoplastic radiation therapy: Secondary | ICD-10-CM | POA: Diagnosis not present

## 2019-01-29 DIAGNOSIS — Z170 Estrogen receptor positive status [ER+]: Secondary | ICD-10-CM | POA: Diagnosis not present

## 2019-01-30 ENCOUNTER — Other Ambulatory Visit: Payer: Self-pay

## 2019-01-30 ENCOUNTER — Ambulatory Visit
Admission: RE | Admit: 2019-01-30 | Discharge: 2019-01-30 | Disposition: A | Discharge status: Home / Self Care | Payer: BC Managed Care – PPO | Source: Ambulatory Visit | Attending: Radiation Oncology | Admitting: Radiation Oncology

## 2019-01-30 DIAGNOSIS — C50411 Malignant neoplasm of upper-outer quadrant of right female breast: Secondary | ICD-10-CM | POA: Diagnosis not present

## 2019-01-30 DIAGNOSIS — Z510 Encounter for antineoplastic radiation therapy: Secondary | ICD-10-CM | POA: Diagnosis not present

## 2019-01-30 DIAGNOSIS — Z170 Estrogen receptor positive status [ER+]: Secondary | ICD-10-CM | POA: Diagnosis not present

## 2019-01-31 ENCOUNTER — Ambulatory Visit
Admission: RE | Admit: 2019-01-31 | Discharge: 2019-01-31 | Disposition: A | Discharge status: Home / Self Care | Payer: BC Managed Care – PPO | Source: Ambulatory Visit | Attending: Radiation Oncology | Admitting: Radiation Oncology

## 2019-01-31 ENCOUNTER — Other Ambulatory Visit: Payer: Self-pay

## 2019-01-31 DIAGNOSIS — Z510 Encounter for antineoplastic radiation therapy: Secondary | ICD-10-CM | POA: Diagnosis not present

## 2019-01-31 DIAGNOSIS — Z170 Estrogen receptor positive status [ER+]: Secondary | ICD-10-CM | POA: Diagnosis not present

## 2019-01-31 DIAGNOSIS — C50411 Malignant neoplasm of upper-outer quadrant of right female breast: Secondary | ICD-10-CM | POA: Diagnosis not present

## 2019-02-03 ENCOUNTER — Other Ambulatory Visit: Payer: Self-pay

## 2019-02-03 ENCOUNTER — Ambulatory Visit
Admission: RE | Admit: 2019-02-03 | Discharge: 2019-02-03 | Disposition: A | Discharge status: Home / Self Care | Payer: BC Managed Care – PPO | Source: Ambulatory Visit | Attending: Radiation Oncology | Admitting: Radiation Oncology

## 2019-02-03 ENCOUNTER — Telehealth: Payer: Self-pay | Admitting: *Deleted

## 2019-02-03 ENCOUNTER — Encounter: Payer: Self-pay | Admitting: Radiation Oncology

## 2019-02-03 DIAGNOSIS — Z170 Estrogen receptor positive status [ER+]: Secondary | ICD-10-CM

## 2019-02-03 DIAGNOSIS — C50411 Malignant neoplasm of upper-outer quadrant of right female breast: Secondary | ICD-10-CM | POA: Diagnosis not present

## 2019-02-03 DIAGNOSIS — Z510 Encounter for antineoplastic radiation therapy: Secondary | ICD-10-CM | POA: Diagnosis not present

## 2019-02-03 MED ORDER — SONAFINE EX EMUL
1.0000 "application " | Freq: Once | CUTANEOUS | Status: AC
  Administered 2019-02-03: 1 via TOPICAL

## 2019-02-03 NOTE — Telephone Encounter (Signed)
This RN spoke with pt - Doris Smith stated how she is feeling ( fatigued, hot, " just not my self " ) and " I don't know how I can stand more " " and the worst is yet to come "  This RN reiterated to patient - our goal is day by day. As well as allowing staff to help her as much as possible  Doris Smith stated " I broke down this morning but they were so good to me. They brought in [place] and she was so nice. I got the nicest girls down there and they are taking care of me "  This RN allowed [facility] to have therapeutic catharsis of how she is feeling with verbal support given validating her current status.  Per discussion Doris Smith stated " they ( rad onc ) wants me to rest but I don't rest so what am I supposed to do "  This RN discussed rest as in something that gives patient strength - which may not be physically laying down but being somewhere or with people that are encouraging and give her hope.  Doris Smith stated she saw her grand children this weekend " and they love their Jacquelynn Cree " " I wanted this breast cancer wreath so they are making one for me "  Per end of discussion- Doris Smith closed the conversation stating appreciation of call and care is receiving.  No other needs at this time.

## 2019-02-03 NOTE — Progress Notes (Signed)
Pt tearful today, concerned about continuing treatment due to skin findings. Using radiaplex and still has about 2 weeks to complete. Reassured pt skin looked to be expected. No desquamation noted but erythema is present in CW, axilla, and right supraclav treatment sites. I recommended changing cream to Sonafine. We will follow up later this week.

## 2019-02-04 ENCOUNTER — Other Ambulatory Visit: Payer: Self-pay

## 2019-02-04 ENCOUNTER — Ambulatory Visit
Admission: RE | Admit: 2019-02-04 | Discharge: 2019-02-04 | Disposition: A | Discharge status: Home / Self Care | Payer: BC Managed Care – PPO | Source: Ambulatory Visit | Attending: Radiation Oncology | Admitting: Radiation Oncology

## 2019-02-04 DIAGNOSIS — C50411 Malignant neoplasm of upper-outer quadrant of right female breast: Secondary | ICD-10-CM | POA: Diagnosis not present

## 2019-02-04 DIAGNOSIS — Z510 Encounter for antineoplastic radiation therapy: Secondary | ICD-10-CM | POA: Diagnosis not present

## 2019-02-04 DIAGNOSIS — Z170 Estrogen receptor positive status [ER+]: Secondary | ICD-10-CM | POA: Diagnosis not present

## 2019-02-05 ENCOUNTER — Ambulatory Visit: Payer: BC Managed Care – PPO

## 2019-02-06 ENCOUNTER — Ambulatory Visit
Admission: RE | Admit: 2019-02-06 | Discharge: 2019-02-06 | Disposition: A | Discharge status: Home / Self Care | Payer: BC Managed Care – PPO | Source: Ambulatory Visit | Attending: Radiation Oncology | Admitting: Radiation Oncology

## 2019-02-06 ENCOUNTER — Other Ambulatory Visit: Payer: Self-pay

## 2019-02-06 DIAGNOSIS — Z170 Estrogen receptor positive status [ER+]: Secondary | ICD-10-CM | POA: Diagnosis not present

## 2019-02-06 DIAGNOSIS — Z510 Encounter for antineoplastic radiation therapy: Secondary | ICD-10-CM | POA: Diagnosis not present

## 2019-02-06 DIAGNOSIS — C50411 Malignant neoplasm of upper-outer quadrant of right female breast: Secondary | ICD-10-CM | POA: Diagnosis not present

## 2019-02-07 ENCOUNTER — Ambulatory Visit
Admission: RE | Admit: 2019-02-07 | Discharge: 2019-02-07 | Disposition: A | Discharge status: Home / Self Care | Payer: BC Managed Care – PPO | Source: Ambulatory Visit | Attending: Radiation Oncology | Admitting: Radiation Oncology

## 2019-02-07 ENCOUNTER — Ambulatory Visit: Payer: BC Managed Care – PPO | Admitting: Radiation Oncology

## 2019-02-07 ENCOUNTER — Other Ambulatory Visit: Payer: Self-pay

## 2019-02-07 DIAGNOSIS — C50411 Malignant neoplasm of upper-outer quadrant of right female breast: Secondary | ICD-10-CM

## 2019-02-07 DIAGNOSIS — Z170 Estrogen receptor positive status [ER+]: Secondary | ICD-10-CM | POA: Diagnosis not present

## 2019-02-07 DIAGNOSIS — Z510 Encounter for antineoplastic radiation therapy: Secondary | ICD-10-CM | POA: Diagnosis not present

## 2019-02-07 MED ORDER — SONAFINE EX EMUL
1.0000 "application " | Freq: Once | CUTANEOUS | Status: AC
  Administered 2019-02-07: 1 via TOPICAL

## 2019-02-10 ENCOUNTER — Ambulatory Visit
Admission: RE | Admit: 2019-02-10 | Discharge: 2019-02-10 | Disposition: A | Discharge status: Home / Self Care | Payer: BC Managed Care – PPO | Source: Ambulatory Visit | Attending: Radiation Oncology | Admitting: Radiation Oncology

## 2019-02-10 ENCOUNTER — Other Ambulatory Visit: Payer: Self-pay

## 2019-02-10 DIAGNOSIS — C50411 Malignant neoplasm of upper-outer quadrant of right female breast: Secondary | ICD-10-CM | POA: Diagnosis not present

## 2019-02-10 DIAGNOSIS — Z170 Estrogen receptor positive status [ER+]: Secondary | ICD-10-CM | POA: Diagnosis not present

## 2019-02-10 DIAGNOSIS — Z510 Encounter for antineoplastic radiation therapy: Secondary | ICD-10-CM | POA: Diagnosis not present

## 2019-02-11 ENCOUNTER — Other Ambulatory Visit: Payer: Self-pay

## 2019-02-11 ENCOUNTER — Ambulatory Visit
Admission: RE | Admit: 2019-02-11 | Discharge: 2019-02-11 | Disposition: A | Discharge status: Home / Self Care | Payer: BC Managed Care – PPO | Source: Ambulatory Visit | Attending: Radiation Oncology | Admitting: Radiation Oncology

## 2019-02-11 ENCOUNTER — Ambulatory Visit
Admission: RE | Admit: 2019-02-11 | Payer: BC Managed Care – PPO | Source: Ambulatory Visit | Admitting: Radiation Oncology

## 2019-02-11 DIAGNOSIS — Z170 Estrogen receptor positive status [ER+]: Secondary | ICD-10-CM

## 2019-02-11 DIAGNOSIS — C50411 Malignant neoplasm of upper-outer quadrant of right female breast: Secondary | ICD-10-CM

## 2019-02-11 DIAGNOSIS — Z510 Encounter for antineoplastic radiation therapy: Secondary | ICD-10-CM | POA: Diagnosis not present

## 2019-02-11 DIAGNOSIS — F312 Bipolar disorder, current episode manic severe with psychotic features: Secondary | ICD-10-CM | POA: Diagnosis not present

## 2019-02-11 DIAGNOSIS — F410 Panic disorder [episodic paroxysmal anxiety] without agoraphobia: Secondary | ICD-10-CM | POA: Diagnosis not present

## 2019-02-11 DIAGNOSIS — F4310 Post-traumatic stress disorder, unspecified: Secondary | ICD-10-CM | POA: Diagnosis not present

## 2019-02-12 ENCOUNTER — Other Ambulatory Visit: Payer: Self-pay

## 2019-02-12 ENCOUNTER — Ambulatory Visit
Admission: RE | Admit: 2019-02-12 | Discharge: 2019-02-12 | Disposition: A | Discharge status: Home / Self Care | Payer: BC Managed Care – PPO | Source: Ambulatory Visit | Attending: Radiation Oncology | Admitting: Radiation Oncology

## 2019-02-12 ENCOUNTER — Ambulatory Visit: Payer: BC Managed Care – PPO | Admitting: Radiation Oncology

## 2019-02-12 ENCOUNTER — Encounter: Payer: Self-pay | Admitting: *Deleted

## 2019-02-12 DIAGNOSIS — C50411 Malignant neoplasm of upper-outer quadrant of right female breast: Secondary | ICD-10-CM | POA: Diagnosis not present

## 2019-02-12 DIAGNOSIS — Z510 Encounter for antineoplastic radiation therapy: Secondary | ICD-10-CM | POA: Diagnosis not present

## 2019-02-12 DIAGNOSIS — Z170 Estrogen receptor positive status [ER+]: Secondary | ICD-10-CM | POA: Diagnosis not present

## 2019-02-12 NOTE — Progress Notes (Signed)
[place] Work  Hol[employer] representative received referral from radiation oncology for emotional support.  CSW contacted patient at home to offer support and assess for needs.  Patient states "I am doing so much better now that I am done with radiation."  Patient described going through radiation treatment as very difficult for her, but praised the radiation staff.  Patient spoke positively about the remainder of her cancer treatment and plans once it is complete.  Patient has been seeing her therapist at [facility] since "[facility]", and stated she spoke with her yesterday.  Patient was appreciative of CSW contact and know to call with needs or concerns.    Johnnye Lana, MSW, LCSW, OSW-C Clinical Social Worker [facility] [phone]

## 2019-02-13 ENCOUNTER — Other Ambulatory Visit: Payer: Self-pay

## 2019-02-13 ENCOUNTER — Ambulatory Visit: Payer: BC Managed Care – PPO | Admitting: Radiation Oncology

## 2019-02-13 ENCOUNTER — Telehealth: Payer: Self-pay | Admitting: *Deleted

## 2019-02-13 ENCOUNTER — Ambulatory Visit
Admission: RE | Admit: 2019-02-13 | Discharge: 2019-02-13 | Disposition: A | Discharge status: Home / Self Care | Payer: BC Managed Care – PPO | Source: Ambulatory Visit | Attending: Radiation Oncology | Admitting: Radiation Oncology

## 2019-02-13 DIAGNOSIS — Z510 Encounter for antineoplastic radiation therapy: Secondary | ICD-10-CM | POA: Diagnosis not present

## 2019-02-13 DIAGNOSIS — C50411 Malignant neoplasm of upper-outer quadrant of right female breast: Secondary | ICD-10-CM | POA: Diagnosis not present

## 2019-02-13 DIAGNOSIS — Z170 Estrogen receptor positive status [ER+]: Secondary | ICD-10-CM | POA: Diagnosis not present

## 2019-02-13 NOTE — Telephone Encounter (Signed)
This RN spoke with pt per her call stating she has " found 2 areas on my chest that I don't think should be there"  She states she noted above while laying on the sofa " and kind of felt a stinging on my upper chest "  She states she has one " knot near where my surgery was done " with second " above my port "  Pt will stop by post radiation tomorrow for MD to feel areas of concern.  Pt agreeable to above plan

## 2019-02-14 ENCOUNTER — Other Ambulatory Visit: Payer: Self-pay

## 2019-02-14 ENCOUNTER — Ambulatory Visit
Admission: RE | Admit: 2019-02-14 | Discharge: 2019-02-14 | Disposition: A | Discharge status: Home / Self Care | Payer: BC Managed Care – PPO | Source: Ambulatory Visit | Attending: Radiation Oncology | Admitting: Radiation Oncology

## 2019-02-14 DIAGNOSIS — Z170 Estrogen receptor positive status [ER+]: Secondary | ICD-10-CM | POA: Diagnosis not present

## 2019-02-14 DIAGNOSIS — C50411 Malignant neoplasm of upper-outer quadrant of right female breast: Secondary | ICD-10-CM | POA: Diagnosis not present

## 2019-02-14 DIAGNOSIS — Z510 Encounter for antineoplastic radiation therapy: Secondary | ICD-10-CM | POA: Diagnosis not present

## 2019-02-17 ENCOUNTER — Ambulatory Visit
Admission: RE | Admit: 2019-02-17 | Discharge: 2019-02-17 | Disposition: A | Discharge status: Home / Self Care | Payer: BC Managed Care – PPO | Source: Ambulatory Visit | Attending: Radiation Oncology | Admitting: Radiation Oncology

## 2019-02-17 ENCOUNTER — Ambulatory Visit: Payer: BC Managed Care – PPO

## 2019-02-17 ENCOUNTER — Other Ambulatory Visit: Payer: Self-pay

## 2019-02-17 DIAGNOSIS — Z510 Encounter for antineoplastic radiation therapy: Secondary | ICD-10-CM | POA: Diagnosis not present

## 2019-02-17 DIAGNOSIS — C50411 Malignant neoplasm of upper-outer quadrant of right female breast: Secondary | ICD-10-CM | POA: Diagnosis not present

## 2019-02-17 DIAGNOSIS — Z170 Estrogen receptor positive status [ER+]: Secondary | ICD-10-CM | POA: Diagnosis not present

## 2019-02-17 NOTE — Progress Notes (Signed)
[facility]  [phone]) [phone] [phone]     ID: Denese Mentink Shirer DOB: 09-29-1950  MR#: [id number]  IWL#:[id number]  Patient Care Team: Chevis Pretty, FNP as PCP - General (Nurse Practitioner) Alphonsa Overall, MD as Consulting Physician (General Surgery) Beatriz Quintela, Virgie Dad, MD as Consulting Physician (Oncology) Kyung Rudd, MD as Consulting Physician (Radiation Oncology) Elijio Miles, MD as Consulting Physician (Pulmonary Disease) Netta Cedars, MD as Consulting Physician (Orthopedic Surgery) Susa Day, MD as Consulting Physician (Orthopedic Surgery) Janeth Rase, NP as Nurse Practitioner (Adult Health Nurse Practitioner) Mauro Kaufmann, RN as Oncology Nurse Navigator Rockwell Germany, RN as Oncology Nurse Navigator Irene Limbo, MD as Consulting Physician (Plastic Surgery) Chauncey Cruel, MD OTHER MD: Chapman Moss, psychiatry   CHIEF COMPLAINT: Estrogen receptor positive breast cancer  CURRENT TREATMENT: Adjuvant chemotherapy   INTERVAL HISTORY: Doris Smith is seen today for follow-up and treatment of her estrogen receptor positive breast cancer.  She is currently undergoing adjuvant radiation, with her final dose 02/19/2019.  She is having significant erythema and some dry desquamation.  She has several creams that she is putting on she says and that is helping.  She continues on cyclophosphamide, methotrexate, fluorouracil (CMF), to be repeated every 21 days x 8. Today is day 1 cycle 6.  We are holding the Methotrexate while she is undergoing adjuvant radiation.   REVIEW OF SYSTEMS: Doris Smith was scheduled to receive Udenyca on day 3 after each CMF cycle beginning with cycle 3, but after that cycle she has not received any further Udenyca and her counts have been adequate.  This is likely because of the omission of methotrexate.  She was very upset today as I was late coming in because of an eye appointment.  We offered to have  her evaluated by our nurse practitioner but she went down to radiation instead and I ended up seeing her in the treatment area.  She tells me that aside from fatigue and the discomfort from the radiation a detailed review of systems since her last visit here has been stable  HISTORY OF CURRENT ILLNESS: From the original intake note:  "Doris Smith" presented to her PCP, Dr. Hassell Done, with right breast pain, fullness, and nipple inversion since November 2019. She proceeded to undergo bilateral diagnostic mammography with tomography and right breast ultrasonography at [facility] on 07/31/2018 showing: findings compatible with multicentric breast cancer spanning at least the upper inner and upper outer quadrants of the right breast; normal right axilla. Palpable firmness of approximately 1.5 cm was noted in the 9 o'clock position, which was also seen on ultrasound with indisctinct margins measuring 2.7 x 2.3 x 2.2 cm. At 1 o'clock, a mass with poorly defined margins measures 1.9 x 1 x 0.8 cm. Additional poorly defined masses were seen in the 12 o'clock retroareolar region.  Accordingly on 08/02/2018 she proceeded to biopsy of the right breast area in question. The pathology ([id number]) from this procedure showed: invasive mammary carcinoma at 9 o'clock and 1 o'clock; e-cadherin negative, consistent with lobular phenotype; grade 2-3. Prognostic indicators significant for: estrogen receptor, 90% positive and progesterone receptor, 1% positive, both with strong staining intensity. Proliferation marker Ki67 at 1%. HER2 equivocal by immunohistochemistry, 2+ but negative by fluorescent in situ hybridization with a signals ratio 1.04 and number per cell 1.2.   The patient's subsequent history is as detailed below.   PAST MEDICAL HISTORY: Past Medical History:  Diagnosis Date   A-fib ([place]) 11/2012   PAF   Anxiety  takes Ativan   Asthma 04/07/2011   dx   Bipolar disorder ([place])    Cancer ([place])    Right  breast   Depression    Dyspnea    DOE   Early cataracts, bilateral    Fatty liver    Fibromyalgia    Fracture, ankle 10/2008   right and left   GERD (gastroesophageal reflux disease)    Irritable bowel syndrome    Mental disorder    dx bipolar   PONV (postoperative nausea and vomiting)    Sleep apnea 04/2011    does not wear CPAP   Wears glasses   She has chronic sinus headaches, hiatal hernia   PAST SURGICAL HISTORY: Past Surgical History:  Procedure Laterality Date   ABDOMINAL HYSTERECTOMY     COLONOSCOPY     DILATION AND CURETTAGE OF UTERUS  1981   abnormal pap   KNEE ARTHROSCOPY Left 2007   x 2   LUMBAR LAMINECTOMY/DECOMPRESSION MICRODISCECTOMY  05/31/2011   Procedure: LUMBAR LAMINECTOMY/DECOMPRESSION MICRODISCECTOMY;  Surgeon: Johnn Hai;  Location: [facility];  Service: Orthopedics;  Laterality: Left;  Decompression Lumbar four to five and  Lumbar five to Sacral one on Left  (X-Ray)   MASTECTOMY W/ SENTINEL NODE BIOPSY Right 09/16/2018   Procedure: RIGHT MASTECTOMY WITH RIGHT AXILLARY SENTINEL LYMPH NODE BIOPSY;  Surgeon: Alphonsa Overall, MD;  Location: [facility];  Service: General;  Laterality: Right;   PARTIAL HYSTERECTOMY  1982   PORTACATH PLACEMENT Left 10/31/2018   Procedure: INSERTION PORT-A-CATH WITH ULTRASOUND;  Surgeon: Alphonsa Overall, MD;  Location: [facility];  Service: General;  Laterality: Left;   TONSILLECTOMY     as child   TOTAL KNEE ARTHROPLASTY Left 12/18/2005    FAMILY HISTORY Family History  Problem Relation Age of Onset   Anesthesia problems Mother    Heart disease Mother        CHF, atrial fib   Heart failure Mother    Anesthesia problems Sister    Colon polyps Father    Heart disease Father        "Fluid around the heart"   Hypertension Sister    Breast cancer Maternal Aunt    Colon cancer Maternal Aunt    Prostate cancer Neg Hx    Ovarian cancer Neg Hx    As of February 2019, patient father  is alive at 51 years old. Patient mother is alive at 46 years old. She notes a family hx of breast cancer. A maternal aunt was diagnosed with breast cancer, but Doris Smith is unsure if it was in one or both breasts. She has 3 siblings, 3 sisters and 0 brothers.   GYNECOLOGIC HISTORY:  No LMP recorded. Patient is postmenopausal. Menarche: 68 years old Age at first live birth: 68 years old [place] P 2 LMP about 42 years ago Contraceptive no HRT no  Hysterectomy? partial So? no   SOCIAL HISTORY: Doris Smith worked in [employer] for 15 years. She is on disability because her back, knees, and nerves. Her husband, [name], has been at [employer] 43 years as a Freight forwarder.  Even though their home is in [place] they are actually living in [place] in an apartment while her husband works there.  Son Legrand Como, age 89, works as a Chief Strategy Officer in [place], [place]. Son Shanon Brow, age 43, works as a Building control surveyor in [place], [place]. They have 4 grandchildren, 2 great-grandchildren with 2 more on the way. She attends a [employer].     ADVANCED  DIRECTIVES:    HEALTH MAINTENANCE: Social History   Tobacco Use   Smoking status: Never Smoker   Smokeless tobacco: Never Used  Substance Use Topics   Alcohol use: No   Drug use: No     Colonoscopy: 2019  PAP: 11/03/2014, normal  Bone density: 10/13/2016, T-score of -2.1   Allergies  Allergen Reactions   Iohexol     "over 20 years ago" had reaction "hurting in arm and heart"-Done in [place], [place].Marland Kitchenphysician stopped CT at that time.   Codeine Other (See Comments)    Makes feel like she is wild    Prednisone Other (See Comments)    Makes me very irritable   Sulfonamide Derivatives Other (See Comments)    Upset stomach   Celecoxib Nausea And Vomiting   Sulfa Antibiotics Nausea And Vomiting   Vitamin B12 Rash    Current Outpatient Medications  Medication Sig Dispense Refill   anastrozole (ARIMIDEX) 1 MG tablet      benzonatate (TESSALON PERLES) 100 MG capsule  Take 1 capsule (100 mg total) by mouth 3 (three) times daily as needed for cough. 20 capsule 0   budesonide-formoterol (SYMBICORT) 160-4.5 MCG/ACT inhaler Inhale 2 puffs into the lungs 2 (two) times daily.     ciprofloxacin (CIPRO) 500 MG tablet Take 1 tablet (500 mg total) by mouth 2 (two) times daily. 14 tablet 0   clotrimazole-betamethasone (LOTRISONE) cream Apply 1 application topically 2 (two) times daily. (Patient taking differently: Apply 1 application topically 2 (two) times daily as needed (rash). ) 30 g 0   dexamethasone (DECADRON) 4 MG tablet Take 1 tablet (4 mg total) by mouth daily. Start the day after chemotherapy for 2 days. Take with food. 30 tablet 1   esomeprazole (NEXIUM) 40 MG capsule TAKE 1 CAPSULE BY MOUTH ONCE DAILY BEFORE BREAKFAST 30 capsule 0   fluticasone (FLONASE) 50 MCG/ACT nasal spray Place 2 sprays into both nostrils daily. 16 g 6   fluticasone furoate-vilanterol (BREO ELLIPTA) 200-25 MCG/INH AEPB Inhale 1 puff into the lungs daily.     gabapentin (NEURONTIN) 300 MG capsule Take 1 capsule (300 mg total) by mouth 3 (three) times daily. (Patient taking differently: Take 300 mg by mouth 2 (two) times daily. ) 90 capsule 5   ibuprofen (ADVIL) 200 MG tablet Take 200 mg by mouth every 6 (six) hours as needed.     lamoTRIgine (LAMICTAL) 100 MG tablet Take 100 mg by mouth 2 (two) times daily.      lidocaine-prilocaine (EMLA) cream Apply to affected area once 30 g 3   LORazepam (ATIVAN) 0.5 MG tablet Take 1 tablet (0.5 mg total) by mouth at bedtime as needed (Nausea or vomiting). 30 tablet 0   magic mouthwash SOLN Take 5 mLs by mouth 4 (four) times daily as needed for mouth pain. 240 mL 0   montelukast (SINGULAIR) 10 MG tablet Take 10 mg by mouth at bedtime.     omeprazole (PRILOSEC) 20 MG capsule Take 1 capsule (20 mg total) by mouth daily. 30 capsule 0   prochlorperazine (COMPAZINE) 10 MG tablet Take 1 tablet (10 mg total) by mouth every 6 (six) hours as needed  (Nausea or vomiting). 30 tablet 1   Vilazodone HCl (VIIBRYD) 40 MG TABS Take 40 mg by mouth daily.     No current facility-administered medications for this visit.    Facility-Administered Medications Ordered in Other Visits  Medication Dose Route Frequency Provider Last Rate Last Dose   sodium chloride flush (NS) 0.9 %  injection 10 mL  10 mL Intracatheter PRN Kristi Hyer, Virgie Dad, MD   10 mL at 02/18/19 1311    OBJECTIVE: Middle-aged white woman who appears stated age 68:   02/18/19 0849  BP: (!) 107/46  Pulse: 75  Resp: 18  Temp: 98.2 F (36.8 C)  SpO2: 100%     Body mass index is 36.65 kg/m.   Wt Readings from Last 3 Encounters:  02/18/19 213 lb 8 oz (96.8 kg)  01/28/19 212 lb 9.6 oz (96.4 kg)  01/15/19 209 lb 3.2 oz (94.9 kg)  ECOG FS:1 - Symptomatic but completely ambulatory   The right breast area is inflamed, bright red, with some dry desquamation, but no evidence of skin breakdown or bleeding.  LAB RESULTS:  CMP     Component Value Date/Time   NA 143 02/18/2019 0808   NA 142 10/13/2016 1045   K 3.9 02/18/2019 0808   CL 106 02/18/2019 0808   CO2 28 02/18/2019 0808   GLUCOSE 131 (H) 02/18/2019 0808   BUN 10 02/18/2019 0808   BUN 8 10/13/2016 1045   CREATININE 0.82 02/18/2019 0808   CREATININE 0.90 12/19/2018 1434   CALCIUM 8.6 (L) 02/18/2019 0808   PROT 6.2 (L) 02/18/2019 0808   PROT 6.3 10/13/2016 1045   ALBUMIN 3.3 (L) 02/18/2019 0808   ALBUMIN 3.9 10/13/2016 1045   AST 17 02/18/2019 0808   AST 14 (L) 08/14/2018 0758   ALT 12 02/18/2019 0808   ALT 10 08/14/2018 0758   ALKPHOS 90 02/18/2019 0808   BILITOT 0.2 (L) 02/18/2019 0808   BILITOT 0.5 08/14/2018 0758   GFRNONAA >60 02/18/2019 0808   GFRNONAA >60 12/19/2018 1434   GFRAA >60 02/18/2019 0808   GFRAA >60 12/19/2018 1434    No results found for: TOTALPROTELP, ALBUMINELP, A1GS, A2GS, BETS, BETA2SER, GAMS, MSPIKE, SPEI  No results found for: KPAFRELGTCHN, LAMBDASER, KAPLAMBRATIO  Lab  Results  Component Value Date   WBC 5.2 02/18/2019   NEUTROABS 3.8 02/18/2019   HGB 10.4 (L) 02/18/2019   HCT 32.7 (L) 02/18/2019   MCV 94.5 02/18/2019   PLT 194 02/18/2019    '@LASTCHEMISTRY' @  No results found for: LABCA2  No components found for: AYTKZS010  No results for input(s): INR in the last 168 hours.  No results found for: LABCA2  No results found for: XNA355  No results found for: DDU202  No results found for: RKY706  No results found for: CA2729  No components found for: HGQUANT  No results found for: CEA1 / No results found for: CEA1   No results found for: AFPTUMOR  No results found for: CHROMOGRNA  No results found for: PSA1  Appointment on 02/18/2019  Component Date Value Ref Range Status   Sodium 02/18/2019 143  135 - 145 mmol/L Final   Potassium 02/18/2019 3.9  3.5 - 5.1 mmol/L Final   Chloride 02/18/2019 106  98 - 111 mmol/L Final   CO2 02/18/2019 28  22 - 32 mmol/L Final   Glucose, Bld 02/18/2019 131* 70 - 99 mg/dL Final   BUN 02/18/2019 10  8 - 23 mg/dL Final   Creatinine, Ser 02/18/2019 0.82  0.44 - 1.00 mg/dL Final   Calcium 02/18/2019 8.6* 8.9 - 10.3 mg/dL Final   Total Protein 02/18/2019 6.2* 6.5 - 8.1 g/dL Final   Albumin 02/18/2019 3.3* 3.5 - 5.0 g/dL Final   AST 02/18/2019 17  15 - 41 U/L Final   ALT 02/18/2019 12  0 - 44 U/L Final  Alkaline Phosphatase 02/18/2019 90  38 - 126 U/L Final   Total Bilirubin 02/18/2019 0.2* 0.3 - 1.2 mg/dL Final   GFR calc non Af Amer 02/18/2019 >60  >60 mL/min Final   GFR calc Af Amer 02/18/2019 >60  >60 mL/min Final   Anion gap 02/18/2019 9  5 - 15 Final   Performed at [facility], [place] [address] [place], [place] [address]   WBC 02/18/2019 5.2  4.0 - 10.5 K/uL Final   RBC 02/18/2019 3.46* 3.87 - 5.11 MIL/uL Final   Hemoglobin 02/18/2019 10.4* 12.0 - 15.0 g/dL Final   HCT 02/18/2019 32.7* 36.0 - 46.0 % Final   MCV 02/18/2019 94.5  80.0 - 100.0 fL Final     MCH 02/18/2019 30.1  26.0 - 34.0 pg Final   MCHC 02/18/2019 31.8  30.0 - 36.0 g/dL Final   RDW 02/18/2019 16.0* 11.5 - 15.5 % Final   Platelets 02/18/2019 194  150 - 400 K/uL Final   nRBC 02/18/2019 0.0  0.0 - 0.2 % Final   Neutrophils Relative % 02/18/2019 71  % Final   Neutro Abs 02/18/2019 3.8  1.7 - 7.7 K/uL Final   Lymphocytes Relative 02/18/2019 7  % Final   Lymphs Abs 02/18/2019 0.4* 0.7 - 4.0 K/uL Final   Monocytes Relative 02/18/2019 15  % Final   Monocytes Absolute 02/18/2019 0.8  0.1 - 1.0 K/uL Final   Eosinophils Relative 02/18/2019 3  % Final   Eosinophils Absolute 02/18/2019 0.1  0.0 - 0.5 K/uL Final   Basophils Relative 02/18/2019 1  % Final   Basophils Absolute 02/18/2019 0.1  0.0 - 0.1 K/uL Final   Immature Granulocytes 02/18/2019 3  % Final   Abs Immature Granulocytes 02/18/2019 0.14* 0.00 - 0.07 K/uL Final   Performed at [facility], [address] [place] [address]    (this displays the last labs from the last 3 days)  No results found for: TOTALPROTELP, ALBUMINELP, A1GS, A2GS, BETS, BETA2SER, GAMS, MSPIKE, SPEI (this displays SPEP labs)  No results found for: KPAFRELGTCHN, LAMBDASER, KAPLAMBRATIO (kappa/lambda light chains)  No results found for: HGBA, HGBA2QUANT, HGBFQUANT, HGBSQUAN (Hemoglobinopathy evaluation)   No results found for: LDH  No results found for: IRON, TIBC, IRONPCTSAT (Iron and TIBC)  No results found for: FERRITIN  Urinalysis    Component Value Date/Time   COLORURINE YELLOW 12/19/2018 1355   APPEARANCEUR HAZY (A) 12/19/2018 1355   APPEARANCEUR Clear 05/03/2018 1204   LABSPEC 1.016 12/19/2018 1355   PHURINE 5.0 12/19/2018 [place] 12/19/2018 [place] 12/19/2018 [place] 12/19/2018 1355   BILIRUBINUR Negative 05/03/2018 1204   KETONESUR NEGATIVE 12/19/2018 1355   PROTEINUR NEGATIVE 12/19/2018 1355   UROBILINOGEN negative  11/03/2014 1039   UROBILINOGEN 0.2 05/25/2011 0923   NITRITE NEGATIVE 12/19/2018 1355   LEUKOCYTESUR SMALL (A) 12/19/2018 1355     STUDIES: No results found.  ELIGIBLE FOR AVAILABLE RESEARCH PROTOCOL: no   ASSESSMENT: 68 y.o. [place], [place] woman status post right breast biopsy 08/02/2018 for a clinical T3 N0, stage IIA invasive lobular carcinoma, grade 2, estrogen receptor strongly positive, progesterone receptor 1% positive, with no HER-2 amplification and an MIB-1 of 1%.  (a) CT scan of the head and chest, without contrast 08/23/2018 showed nonspecific 0.4 cm left lower lobe pulmonary nodule, no definitive metastatic disease  (b) bone scan 08/23/2018 shows multiple spinal areas of abnormal uptake, but  (c) total spinal  MRI 09/03/2018 finds bone scan findings to be secondary to degenerative disease, no evidence of metastatic disease.  (1) status post right mastectomy and sentinel lymph node sampling 09/16/2018 showing a pT3 pN1(mic), stage IIA-IIIA invasive lobular breast cancer, grade 2, with 1 of 5 sampled lymph nodes involved by micrometastatic deposit, with extra nodular extension; ample margins  (2)  MammaPrint "high risk" suggests a 5-year metastasis free survival of 93% with chemotherapy, with an absolute chemotherapy benefit in the greater than 12% range  (3) adjuvant radiation to follow (can be done concurrently with chemotherapy)  (4) anastrozole started neoadjuvantly (on 08/14/2018), discontinued 10/14/2018 in preparation for chemotherapy  (5) Caris requested 10/14/2018  (6) adjuvant chemotherapy consisting of cyclophosphamide, methotrexate, fluorouracil (CMF) started 11/05/2018, to be repeated every 21 days x 8   PLAN: Doris Smith is tolerating the CMF chemotherapy remarkably well and though her hair is slightly thinning, this is barely noticeable.  She has minimal side effects from the treatment aside from that  She is just about done with radiation.  Her chest is quite raw  and has some dry desquamation on exam today.  She just "wants to get it over with" and tomorrow is her last day of treatment.  We had set her up for you to indicate on day 3 but she has not been taking this.  When we discussed it today she tells me she really sees no reason for it.  At present I think that is correct since we have omitted the methotrexate.  This however will be added with the seventh chemotherapy cycle 3 weeks from now.  She will see [place] again in 3 weeks.  She knows to call for any other issues that may develop before that visit.   Virgie Dad. Joevon Holliman, MD 02/18/19 3:42 PM Medical Oncology and Hematology [facility] [address] [place], [place] [address] Tel. [phone]    Fax. [phone]    I, Wilburn Mylar, am acting as scribe for Dr. Virgie Dad. Henleigh Robello.  I, Lurline Del MD, have reviewed the above documentation for accuracy and completeness, and I agree with the above.

## 2019-02-18 ENCOUNTER — Ambulatory Visit: Payer: BLUE CROSS/BLUE SHIELD

## 2019-02-18 ENCOUNTER — Other Ambulatory Visit: Payer: Self-pay

## 2019-02-18 ENCOUNTER — Other Ambulatory Visit: Payer: BLUE CROSS/BLUE SHIELD

## 2019-02-18 ENCOUNTER — Inpatient Hospital Stay: Payer: BC Managed Care – PPO | Attending: Oncology

## 2019-02-18 ENCOUNTER — Inpatient Hospital Stay: Payer: BC Managed Care – PPO

## 2019-02-18 ENCOUNTER — Inpatient Hospital Stay (HOSPITAL_BASED_OUTPATIENT_CLINIC_OR_DEPARTMENT_OTHER): Payer: BC Managed Care – PPO | Admitting: Oncology

## 2019-02-18 ENCOUNTER — Ambulatory Visit
Admission: RE | Admit: 2019-02-18 | Discharge: 2019-02-18 | Disposition: A | Discharge status: Home / Self Care | Payer: BC Managed Care – PPO | Source: Ambulatory Visit | Attending: Radiation Oncology | Admitting: Radiation Oncology

## 2019-02-18 VITALS — BP 107/46 | HR 75 | Temp 98.2°F | Resp 18 | Ht 64.0 in | Wt 213.5 lb

## 2019-02-18 DIAGNOSIS — Z170 Estrogen receptor positive status [ER+]: Secondary | ICD-10-CM | POA: Diagnosis not present

## 2019-02-18 DIAGNOSIS — C50211 Malignant neoplasm of upper-inner quadrant of right female breast: Secondary | ICD-10-CM | POA: Insufficient documentation

## 2019-02-18 DIAGNOSIS — C50411 Malignant neoplasm of upper-outer quadrant of right female breast: Secondary | ICD-10-CM | POA: Diagnosis not present

## 2019-02-18 DIAGNOSIS — Z9011 Acquired absence of right breast and nipple: Secondary | ICD-10-CM | POA: Diagnosis not present

## 2019-02-18 DIAGNOSIS — Z79899 Other long term (current) drug therapy: Secondary | ICD-10-CM | POA: Insufficient documentation

## 2019-02-18 DIAGNOSIS — Z5111 Encounter for antineoplastic chemotherapy: Secondary | ICD-10-CM | POA: Insufficient documentation

## 2019-02-18 DIAGNOSIS — Z95828 Presence of other vascular implants and grafts: Secondary | ICD-10-CM

## 2019-02-18 DIAGNOSIS — Z510 Encounter for antineoplastic radiation therapy: Secondary | ICD-10-CM | POA: Diagnosis not present

## 2019-02-18 LAB — CBC WITH DIFFERENTIAL/PLATELET
Abs Immature Granulocytes: 0.14 10*3/uL — ABNORMAL HIGH (ref 0.00–0.07)
Basophils Absolute: 0.1 10*3/uL (ref 0.0–0.1)
Basophils Relative: 1 %
Eosinophils Absolute: 0.1 10*3/uL (ref 0.0–0.5)
Eosinophils Relative: 3 %
HCT: 32.7 % — ABNORMAL LOW (ref 36.0–46.0)
Hemoglobin: 10.4 g/dL — ABNORMAL LOW (ref 12.0–15.0)
Immature Granulocytes: 3 %
Lymphocytes Relative: 7 %
Lymphs Abs: 0.4 10*3/uL — ABNORMAL LOW (ref 0.7–4.0)
MCH: 30.1 pg (ref 26.0–34.0)
MCHC: 31.8 g/dL (ref 30.0–36.0)
MCV: 94.5 fL (ref 80.0–100.0)
Monocytes Absolute: 0.8 10*3/uL (ref 0.1–1.0)
Monocytes Relative: 15 %
Neutro Abs: 3.8 10*3/uL (ref 1.7–7.7)
Neutrophils Relative %: 71 %
Platelets: 194 10*3/uL (ref 150–400)
RBC: 3.46 MIL/uL — ABNORMAL LOW (ref 3.87–5.11)
RDW: 16 % — ABNORMAL HIGH (ref 11.5–15.5)
WBC: 5.2 10*3/uL (ref 4.0–10.5)
nRBC: 0 % (ref 0.0–0.2)

## 2019-02-18 LAB — COMPREHENSIVE METABOLIC PANEL
ALT: 12 U/L (ref 0–44)
AST: 17 U/L (ref 15–41)
Albumin: 3.3 g/dL — ABNORMAL LOW (ref 3.5–5.0)
Alkaline Phosphatase: 90 U/L (ref 38–126)
Anion gap: 9 (ref 5–15)
BUN: 10 mg/dL (ref 8–23)
CO2: 28 mmol/L (ref 22–32)
Calcium: 8.6 mg/dL — ABNORMAL LOW (ref 8.9–10.3)
Chloride: 106 mmol/L (ref 98–111)
Creatinine, Ser: 0.82 mg/dL (ref 0.44–1.00)
GFR calc Af Amer: >60 mL/min (ref >60)
GFR calc non Af Amer: >60 mL/min (ref >60)
Glucose, Bld: 131 mg/dL — ABNORMAL HIGH (ref 70–99)
Potassium: 3.9 mmol/L (ref 3.5–5.1)
Sodium: 143 mmol/L (ref 135–145)
Total Bilirubin: 0.2 mg/dL — ABNORMAL LOW (ref 0.3–1.2)
Total Protein: 6.2 g/dL — ABNORMAL LOW (ref 6.5–8.1)

## 2019-02-18 MED ORDER — SODIUM CHLORIDE 0.9% FLUSH
10.0000 mL | INTRAVENOUS | Status: DC | PRN
  Administered 2019-02-18: 10 mL
  Filled 2019-02-18: qty 10

## 2019-02-18 MED ORDER — FLUOROURACIL CHEMO INJECTION 2.5 GM/50ML
600.0000 mg/m2 | Freq: Once | INTRAVENOUS | Status: AC
  Administered 2019-02-18: 1250 mg via INTRAVENOUS
  Filled 2019-02-18: qty 25

## 2019-02-18 MED ORDER — SODIUM CHLORIDE 0.9 % IV SOLN
600.0000 mg/m2 | Freq: Once | INTRAVENOUS | Status: AC
  Administered 2019-02-18: 1260 mg via INTRAVENOUS
  Filled 2019-02-18: qty 63

## 2019-02-18 MED ORDER — DEXAMETHASONE SODIUM PHOSPHATE 10 MG/ML IJ SOLN
INTRAMUSCULAR | Status: AC
  Filled 2019-02-18: qty 1

## 2019-02-18 MED ORDER — PALONOSETRON HCL INJECTION 0.25 MG/5ML
INTRAVENOUS | Status: AC
  Filled 2019-02-18: qty 5

## 2019-02-18 MED ORDER — SODIUM CHLORIDE 0.9 % IV SOLN
Freq: Once | INTRAVENOUS | Status: AC
  Administered 2019-02-18: 11:00:00 via INTRAVENOUS
  Filled 2019-02-18: qty 250

## 2019-02-18 MED ORDER — HEPARIN SOD (PORK) LOCK FLUSH 100 UNIT/ML IV SOLN
500.0000 [IU] | Freq: Once | INTRAVENOUS | Status: AC | PRN
  Administered 2019-02-18: 500 [IU]
  Filled 2019-02-18: qty 5

## 2019-02-18 MED ORDER — DEXAMETHASONE SODIUM PHOSPHATE 10 MG/ML IJ SOLN
10.0000 mg | Freq: Once | INTRAMUSCULAR | Status: AC
  Administered 2019-02-18: 10 mg via INTRAVENOUS

## 2019-02-18 MED ORDER — PALONOSETRON HCL INJECTION 0.25 MG/5ML
0.2500 mg | Freq: Once | INTRAVENOUS | Status: AC
  Administered 2019-02-18: 0.25 mg via INTRAVENOUS

## 2019-02-18 NOTE — Patient Instructions (Signed)
[facility] Discharge Instructions for Patients Receiving Chemotherapy  Today you received the following chemotherapy agents: Cytoxan and 5 FU.  To help prevent nausea and vomiting after your treatment, we encourage you to take your nausea medication as directed  If you develop nausea and vomiting that is not controlled by your nausea medication, call the clinic.   BELOW ARE SYMPTOMS THAT SHOULD BE REPORTED IMMEDIATELY:  *FEVER GREATER THAN 100.5 F  *CHILLS WITH OR WITHOUT FEVER  NAUSEA AND VOMITING THAT IS NOT CONTROLLED WITH YOUR NAUSEA MEDICATION  *UNUSUAL SHORTNESS OF BREATH  *UNUSUAL BRUISING OR BLEEDING  TENDERNESS IN MOUTH AND THROAT WITH OR WITHOUT PRESENCE OF ULCERS  *URINARY PROBLEMS  *BOWEL PROBLEMS  UNUSUAL RASH Items with * indicate a potential emergency and should be followed up as soon as possible.  Feel free to call the clinic should you have any questions or concerns. The clinic phone number is [phone].  Please show the [facility] at check-in to the Emergency Department and triage nurse.

## 2019-02-19 ENCOUNTER — Encounter: Payer: Self-pay | Admitting: Radiation Oncology

## 2019-02-19 ENCOUNTER — Other Ambulatory Visit: Payer: Self-pay

## 2019-02-19 ENCOUNTER — Telehealth: Payer: Self-pay | Admitting: Oncology

## 2019-02-19 ENCOUNTER — Ambulatory Visit
Admission: RE | Admit: 2019-02-19 | Discharge: 2019-02-19 | Disposition: A | Discharge status: Home / Self Care | Payer: BC Managed Care – PPO | Source: Ambulatory Visit | Attending: Radiation Oncology | Admitting: Radiation Oncology

## 2019-02-19 DIAGNOSIS — C50411 Malignant neoplasm of upper-outer quadrant of right female breast: Secondary | ICD-10-CM | POA: Diagnosis not present

## 2019-02-19 DIAGNOSIS — Z510 Encounter for antineoplastic radiation therapy: Secondary | ICD-10-CM | POA: Diagnosis not present

## 2019-02-19 DIAGNOSIS — Z170 Estrogen receptor positive status [ER+]: Secondary | ICD-10-CM | POA: Diagnosis not present

## 2019-02-19 NOTE — Telephone Encounter (Signed)
I talk with patient regarding schedule  

## 2019-02-20 ENCOUNTER — Ambulatory Visit: Payer: BC Managed Care – PPO

## 2019-02-21 ENCOUNTER — Other Ambulatory Visit: Payer: Self-pay | Admitting: Oncology

## 2019-02-25 ENCOUNTER — Telehealth: Payer: Self-pay | Admitting: *Deleted

## 2019-02-25 ENCOUNTER — Encounter: Payer: Self-pay | Admitting: *Deleted

## 2019-02-25 NOTE — Telephone Encounter (Signed)
Received call from pt stating she has been experiencing blood tinged drainage from right breast.  Pt states she has a burn to her right breast from radiation and has been putting wound cream on it with no relief. Pt denies fever.  Pt wanting to be seen in [facility].  RN spoke with Sandi Mealy, and okay to schedule pt in [facility] tomorrow 02/26/2019 at 10am.  Message sent to scheduling.

## 2019-02-26 ENCOUNTER — Inpatient Hospital Stay (HOSPITAL_BASED_OUTPATIENT_CLINIC_OR_DEPARTMENT_OTHER): Payer: BC Managed Care – PPO | Admitting: Medical

## 2019-02-26 ENCOUNTER — Other Ambulatory Visit: Payer: Self-pay

## 2019-02-26 VITALS — BP 144/68 | HR 79 | Temp 98.9°F | Resp 18 | Ht 64.0 in | Wt 210.0 lb

## 2019-02-26 DIAGNOSIS — C50911 Malignant neoplasm of unspecified site of right female breast: Secondary | ICD-10-CM

## 2019-02-26 DIAGNOSIS — T300 Burn of unspecified body region, unspecified degree: Secondary | ICD-10-CM | POA: Diagnosis not present

## 2019-02-26 DIAGNOSIS — Z9011 Acquired absence of right breast and nipple: Secondary | ICD-10-CM | POA: Diagnosis not present

## 2019-02-26 DIAGNOSIS — Z79899 Other long term (current) drug therapy: Secondary | ICD-10-CM | POA: Diagnosis not present

## 2019-02-26 DIAGNOSIS — Z5111 Encounter for antineoplastic chemotherapy: Secondary | ICD-10-CM | POA: Diagnosis not present

## 2019-02-26 DIAGNOSIS — Z170 Estrogen receptor positive status [ER+]: Secondary | ICD-10-CM | POA: Diagnosis not present

## 2019-02-26 DIAGNOSIS — C50211 Malignant neoplasm of upper-inner quadrant of right female breast: Secondary | ICD-10-CM | POA: Diagnosis not present

## 2019-02-26 MED ORDER — SILVER SULFADIAZINE 1 % EX CREA: 1.0000 "application " | TOPICAL_CREAM | Freq: Two times a day (BID) | CUTANEOUS | 0 refills | Status: DC

## 2019-02-26 NOTE — Progress Notes (Signed)
[facility] Progress Note   Doris Smith [id number] Jan 10, 1951 68 y.o.  Walter Min Vaillancourt is managed by Dr. Jana Hakim  Actively treated with chemotherapy/immunotherapy/hormonal therapy: yes  Current therapy: CMF  Last treated: 02/18/2019 (cycle 6, day 1)  Next scheduled appointment with provider: 03/11/2019  Assessment: Plan:    Radiation burn - Plan: silver sulfADIAZINE (SILVADENE) 1 % cream  Breast cancer, stage 2, right ([place])   Ionizing radiation burn to the right chest wall: The patient was given a prescription for Silvadene cream to use twice daily.  It was recommended that she keep the container and the refrigerator as this will aid in relief of her right chest wall pain.  She is taking Motrin or Tylenol as needed and declines other pain medications.  Stage II ER positive right breast cancer: Ms. Basford is status post 30/30 fractions of radiation therapy to the right chest wall.  She is also status post cycle 6, day 1 of CMF chemotherapy which was dosed on 02/18/2019.  She will return as scheduled on 03/11/2019.  Please see After Visit Summary for patient specific instructions.  Future Appointments  Date Time Provider [place]  03/11/2019 12:15 PM [facility] LAB 5 [facility] None  03/11/2019 12:30 PM [facility] [place] FLUSH [facility] None  03/11/2019  1:00 PM Gardenia Phlegm, NP [facility] None  03/11/2019  2:00 PM [facility] INFUSION [facility] None  03/13/2019 12:15 PM [facility] [place] FLUSH [facility] None  04/01/2019 12:15 PM [facility] LAB 4 [facility] None  04/01/2019 12:30 PM [facility] [place] FLUSH [facility] None  04/01/2019  1:00 PM Magrinat, Virgie Dad, MD [facility] None  04/01/2019  2:00 PM [facility] INFUSION [facility] None  04/03/2019 12:15 PM [facility] [place] FLUSH [facility] None    No orders of the defined types were placed in this encounter.      Subjective:   Patient ID:  Doris Smith is a 68 y.o. (DOB [birth year]) female.   Chief Complaint:  Chief Complaint  Patient presents with  . Radiation Burn    R breast    HPI Doris Smith is a 68 year old female with a history of a stage II ER positive malignant neoplasm of the right breast who is managed by Dr. Jana Hakim.  She is status post cycle 6, day 1 of CMF chemotherapy which was dosed on 02/18/2019.  She completed 30/30 fractions of radiation therapy last Wednesday.   The last 5 fractions of radiation therapy were a boost to her right chest wall mastectomy scar.  She presents to the clinic today with a radiation burn to her right chest wall.  She has noted some bloody drainage from the area.  The drainage is not purulent.  She denies fevers, chills, or sweats.  She had been given a cream to use by radiation oncology but has had minimal relief with this intervention.  Medications: I have reviewed the patient's current medications.  Allergies:  Allergies  Allergen Reactions  . Iohexol     "over 20 years ago" had reaction "hurting in arm and heart"-Done in [place], [place].Marland Kitchenphysician stopped CT at that time.  . Codeine Other (See Comments)    Makes feel like she is wild   . Prednisone Other (See Comments)    Makes me very irritable  . Sulfonamide Derivatives Other (See Comments)    Upset stomach  . Celecoxib Nausea And Vomiting  . Sulfa Antibiotics Nausea And Vomiting  . Vitamin B12 Rash    Past Medical History:  Diagnosis Date  .  A-fib ([place]) 11/2012   PAF  . Anxiety    takes Ativan  . Asthma 04/07/2011   dx  . Bipolar disorder ([place])   . Cancer [facility])    Right breast  . Depression   . Dyspnea    DOE  . Early cataracts, bilateral   . Fatty liver   . Fibromyalgia   . Fracture, ankle 10/2008   right and left  . GERD (gastroesophageal reflux disease)   . Irritable bowel syndrome   . Mental disorder    dx bipolar  . PONV (postoperative nausea and vomiting)   . Sleep apnea 04/2011    does not wear CPAP  . Wears glasses     Past Surgical  History:  Procedure Laterality Date  . ABDOMINAL HYSTERECTOMY    . COLONOSCOPY    . DILATION AND CURETTAGE OF UTERUS  1981   abnormal pap  . KNEE ARTHROSCOPY Left 2007   x 2  . LUMBAR LAMINECTOMY/DECOMPRESSION MICRODISCECTOMY  05/31/2011   Procedure: LUMBAR LAMINECTOMY/DECOMPRESSION MICRODISCECTOMY;  Surgeon: Johnn Hai;  Location: [facility];  Service: Orthopedics;  Laterality: Left;  Decompression Lumbar four to five and  Lumbar five to Sacral one on Left  (X-Ray)  . MASTECTOMY W/ SENTINEL NODE BIOPSY Right 09/16/2018   Procedure: RIGHT MASTECTOMY WITH RIGHT AXILLARY SENTINEL LYMPH NODE BIOPSY;  Surgeon: Alphonsa Overall, MD;  Location: [place];  Service: General;  Laterality: Right;  . PARTIAL HYSTERECTOMY  1982  . PORTACATH PLACEMENT Left 10/31/2018   Procedure: INSERTION PORT-A-CATH WITH ULTRASOUND;  Surgeon: Alphonsa Overall, MD;  Location: [place];  Service: General;  Laterality: Left;  . TONSILLECTOMY     as child  . TOTAL KNEE ARTHROPLASTY Left 12/18/2005    Family History  Problem Relation Age of Onset  . Anesthesia problems Mother   . Heart disease Mother        CHF, atrial fib  . Heart failure Mother   . Anesthesia problems Sister   . Colon polyps Father   . Heart disease Father        "Fluid around the heart"  . Hypertension Sister   . Breast cancer Maternal Aunt   . Colon cancer Maternal Aunt   . Prostate cancer Neg Hx   . Ovarian cancer Neg Hx     Social History   Socioeconomic History  . Marital status: Married    Spouse name: [name]  . Number of children: 2  . Years of education: 61  . Highest education level: Not on file  Occupational History  . Occupation: Disabled  Social Needs  . Financial resource strain: Not on file  . Food insecurity    Worry: Not on file    Inability: Not on file  . Transportation needs    Medical: Not on file    Non-medical: Not on file  Tobacco Use  . Smoking status: Never Smoker  . Smokeless tobacco: Never  Used  Substance and Sexual Activity  . Alcohol use: No  . Drug use: No  . Sexual activity: Yes    Birth control/protection: Post-menopausal, Surgical  Lifestyle  . Physical activity    Days per week: Not on file    Minutes per session: Not on file  . Stress: Not on file  Relationships  . Social Herbalist on phone: Not on file    Gets together: Not on file    Attends religious service: Not on file    Active member  of club or organization: Not on file    Attends meetings of clubs or organizations: Not on file    Relationship status: Not on file  . Intimate partner violence    Fear of current or ex partner: Not on file    Emotionally abused: Not on file    Physically abused: Not on file    Forced sexual activity: Not on file  Other Topics Concern  . Not on file  Social History Narrative   Tea daily.  Rarely has caffeine     Past Medical History, Surgical history, Social history, and Family history were reviewed and updated as appropriate.   Please see review of systems for further details on the patient's review from today.   Review of Systems:  Review of Systems  Constitutional: Negative for chills, diaphoresis and fever.  HENT: Negative for facial swelling and trouble swallowing.   Respiratory: Negative for cough, chest tightness and shortness of breath.   Cardiovascular: Negative for chest pain.  Skin: Positive for wound. Negative for color change and rash.       Hyperpigmentation of the right chest wall secondary to radiation therapy with a radiation burn of the right chest wall.    Objective:   Physical Exam:  BP (!) 144/68 (BP Location: Left Arm, Patient Position: Sitting)   Pulse 79   Temp 98.9 F (37.2 C) (Temporal)   Resp 18   Ht 5\' 4"  (1.626 m)   Wt 210 lb (95.3 kg)   SpO2 100%   BMI 36.05 kg/m  ECOG: 1  Physical Exam Constitutional:      General: She is not in acute distress.    Appearance: Normal appearance. She is not ill-appearing or  diaphoretic.  HENT:     Head: Normocephalic and atraumatic.  Eyes:     General: No scleral icterus.       Right eye: No discharge.        Left eye: No discharge.     Conjunctiva/sclera: Conjunctivae normal.  Skin:    General: Skin is warm and dry.     Findings: Erythema present.     Comments: There is an area of hyperpigmentation in the right chest wall consistent with a radiation field.  A well-healed mastectomy scar is noted.  There is 3rd degree ionizing radiation burn along the lateral two thirds of the surgical incision.  There is granulation tissue and moist exudate consistent with healing.  There is no purulent discharge.  Neurological:     Mental Status: She is alert.     Coordination: Coordination normal.     Gait: Gait normal.  Psychiatric:        Mood and Affect: Mood normal.        Behavior: Behavior normal.        Thought Content: Thought content normal.        Judgment: Judgment normal.     Lab Review:     Component Value Date/Time   NA 143 02/18/2019 0808   NA 142 10/13/2016 1045   K 3.9 02/18/2019 0808   CL 106 02/18/2019 0808   CO2 28 02/18/2019 0808   GLUCOSE 131 (H) 02/18/2019 0808   BUN 10 02/18/2019 0808   BUN 8 10/13/2016 1045   CREATININE 0.82 02/18/2019 0808   CREATININE 0.90 12/19/2018 1434   CALCIUM 8.6 (L) 02/18/2019 0808   PROT 6.2 (L) 02/18/2019 0808   PROT 6.3 10/13/2016 1045   ALBUMIN 3.3 (L) 02/18/2019 0808   ALBUMIN  3.9 10/13/2016 1045   AST 17 02/18/2019 0808   AST 14 (L) 08/14/2018 0758   ALT 12 02/18/2019 0808   ALT 10 08/14/2018 0758   ALKPHOS 90 02/18/2019 0808   BILITOT 0.2 (L) 02/18/2019 0808   BILITOT 0.5 08/14/2018 0758   GFRNONAA >60 02/18/2019 0808   GFRNONAA >60 12/19/2018 1434   GFRAA >60 02/18/2019 0808   GFRAA >60 12/19/2018 1434       Component Value Date/Time   WBC 5.2 02/18/2019 0808   RBC 3.46 (L) 02/18/2019 0808   HGB 10.4 (L) 02/18/2019 0808   HGB 12.7 08/14/2018 0758   HGB 14.0 10/13/2016 1045   HCT  32.7 (L) 02/18/2019 0808   HCT 42.0 10/13/2016 1045   PLT 194 02/18/2019 0808   PLT 242 08/14/2018 0758   PLT 267 10/13/2016 1045   MCV 94.5 02/18/2019 0808   MCV 88 10/13/2016 1045   MCH 30.1 02/18/2019 0808   MCHC 31.8 02/18/2019 0808   RDW 16.0 (H) 02/18/2019 0808   RDW 14.1 10/13/2016 1045   LYMPHSABS 0.4 (L) 02/18/2019 0808   LYMPHSABS 1.6 10/13/2016 1045   MONOABS 0.8 02/18/2019 0808   EOSABS 0.1 02/18/2019 0808   EOSABS 0.1 10/13/2016 1045   BASOSABS 0.1 02/18/2019 0808   BASOSABS 0.1 10/13/2016 1045   -------------------------------  Imaging from last 24 hours (if applicable):  Radiology interpretation: No results found.

## 2019-02-26 NOTE — Progress Notes (Signed)
Pt presents with radiation burn on R breast that is seeping bloody drainage since final radiation last week.  Denies fever/chills, N/V, or CP/SOB.  Pt states she has been using the provided wound cream on the burn but that it isn't helping.  Afebrile.  Reports tenderness around area & mild swelling.  No recent injury.

## 2019-02-26 NOTE — Patient Instructions (Signed)
Burn Care, Adult A burn is an injury to the skin or the tissues under the skin. There are three types of burns:  First degree. These burns may cause the skin to be red and slightly swollen.  Second degree. These burns are very painful and cause the skin to be very red. The skin may also leak fluid, look shiny, and develop blisters.  Third degree. These burns cause permanent damage. They either turn the skin white or black and make it look charred, dry, and leathery. Taking care of your burn properly can help to prevent pain and infection. It can also help the burn to heal more quickly. What are the risks? Complications from burns include:  Damage to the skin.  Reduced blood flow near the injury.  Dead tissue.  Scarring.  Problems with movement, if the burn happened near a joint or on the hands or feet. Severe burns can lead to problems that affect the whole body, such as:  Fluid loss.  Less blood circulating in the body.  Inability to maintain a normal core body temperature (thermoregulation).  Infection.  Shock.  Problems breathing. How to care for a first-degree burn Right after a burn:  Rinse or soak the burn under cool water until the pain stops. Do not put ice on your burn. This can cause more damage.  Lightly cover the burn with a sterile cloth (dressing). Burn care  Follow instructions from your health care provider about: ? How to clean and take care of the burn. ? When to change and remove the dressing.  Check your burn every day for signs of infection. Check for: ? More redness, swelling, or pain. ? Warmth. ? Pus or a bad smell. Medicine  Take over-the-counter and prescription medicines only as told by your health care provider.  If you were prescribed antibiotic medicine, take or apply it as told by your health care provider. Do not stop using the antibiotic even if your condition improves. General instructions  To prevent infection, do not put  butter, oil, or other home remedies on your burn.  Do not rub your burn, even when you are cleaning it.  Protect your burn from the sun. How to care for a second-degree burn Right after a burn:  Rinse or soak the burn under cool water. Do this for several minutes. Do not put ice on your burn. This can cause more damage.  Lightly cover the burn with a sterile cloth (dressing). Burn care  Raise (elevate) the injured area above the level of your heart while sitting or lying down.  Follow instructions from your health care provider about: ? How to clean and take care of the burn. ? When to change and remove the dressing.  Check your burn every day for signs of infection. Check for: ? More redness, swelling, or pain. ? Warmth. ? Pus or a bad smell. Medicine   Take over-the-counter and prescription medicines only as told by your health care provider.  If you were prescribed antibiotic medicine, take or apply it as told by your health care provider. Do not stop using the antibiotic even if your condition improves. General instructions  To prevent infection: ? Do not put butter, oil, or other home remedies on the burn. ? Do not scratch or pick at the burn. ? Do not break any blisters. ? Do not peel skin.  Do not rub your burn, even when you are cleaning it.  Protect your burn from the sun. How   to care for a third-degree burn Right after a burn:  Lightly cover the burn with gauze.  Seek immediate medical attention. Burn care  Raise (elevate) the injured area above the level of your heart while sitting or lying down.  Drink enough fluid to keep your urine clear or pale yellow.  Rest as told by your health care provider. Do not participate in sports or other physical activities until your health care provider approves.  Follow instructions from your health care provider about: ? How to clean and take care of the burn. ? When to change and remove the dressing.  Check  your burn every day for signs of infection. Check for: ? More redness, swelling, or pain. ? Warmth. ? Pus or a bad smell. Medicine  Take over-the-counter and prescription medicines only as told by your health care provider.  If you were prescribed antibiotic medicine, take or apply it as told by your health care provider. Do not stop using the antibiotic even if your condition improves. General instructions  To prevent infection: ? Do not put butter, oil, or other home remedies on the burn. ? Do not scratch or pick at the burn. ? Do not break any blisters. ? Do not peel skin. ? Do not rub your burn, even when you are cleaning it.  Protect your burn from the sun.  Keep all follow-up visits as told by your health care provider. This is important. Contact a health care provider if:  Your condition does not improve.  Your condition gets worse.  You have a fever.  Your burn changes in appearance or develops black or red spots.  Your burn feels warm to the touch.  Your pain is not controlled with medicine. Get help right away if:  You have redness, swelling, or pain at the site of the burn.  You have fluid, blood, or pus coming from your burn.  You have red streaks near the burn.  You have severe pain. This information is not intended to replace advice given to you by your health care provider. Make sure you discuss any questions you have with your health care provider. Document Released: 06/26/2005 Document Revised: 06/08/2017 Document Reviewed: 12/14/2015 Elsevier Patient Education  2020 [employer].

## 2019-02-28 ENCOUNTER — Other Ambulatory Visit: Payer: Self-pay | Admitting: Medical

## 2019-02-28 MED ORDER — HYDROCODONE-ACETAMINOPHEN 5-325 MG PO TABS: ORAL_TABLET | ORAL | 0 refills | Status: DC

## 2019-03-03 ENCOUNTER — Telehealth: Payer: Self-pay | Admitting: Medical

## 2019-03-03 ENCOUNTER — Telehealth: Payer: Self-pay | Admitting: Emergency Medicine

## 2019-03-03 ENCOUNTER — Inpatient Hospital Stay (HOSPITAL_BASED_OUTPATIENT_CLINIC_OR_DEPARTMENT_OTHER): Payer: BC Managed Care – PPO | Admitting: Medical

## 2019-03-03 ENCOUNTER — Other Ambulatory Visit: Payer: Self-pay

## 2019-03-03 VITALS — BP 126/73 | HR 81 | Temp 98.9°F | Resp 18 | Ht 64.0 in | Wt 217.4 lb

## 2019-03-03 DIAGNOSIS — C50911 Malignant neoplasm of unspecified site of right female breast: Secondary | ICD-10-CM

## 2019-03-03 NOTE — Patient Instructions (Signed)
COVID-19: How to Protect Yourself and Others Know how it spreads  There is currently no vaccine to prevent coronavirus disease 2019 (COVID-19).  The best way to prevent illness is to avoid being exposed to this virus.  The virus is thought to spread mainly from person-to-person. ? Between people who are in close contact with one another (within about 6 feet). ? Through respiratory droplets produced when an infected person coughs, sneezes or talks. ? These droplets can land in the mouths or noses of people who are nearby or possibly be inhaled into the lungs. ? Some recent studies have suggested that COVID-19 may be spread by people who are not showing symptoms. Everyone should Clean your hands often  Wash your hands often with soap and water for at least 20 seconds especially after you have been in a public place, or after blowing your nose, coughing, or sneezing.  If soap and water are not readily available, use a hand sanitizer that contains at least 60% alcohol. Cover all surfaces of your hands and rub them together until they feel dry.  Avoid touching your eyes, nose, and mouth with unwashed hands. Avoid close contact  Stay home if you are sick.  Avoid close contact with people who are sick.  Put distance between yourself and other people. ? Remember that some people without symptoms may be able to spread virus. ? This is especially important for people who are at higher risk of getting very sick.www.cdc.gov/coronavirus/2019-ncov/need-extra-precautions/people-at-higher-risk.html Cover your mouth and nose with a cloth face cover when around others  You could spread COVID-19 to others even if you do not feel sick.  Everyone should wear a cloth face cover when they have to go out in public, for example to the grocery store or to pick up other necessities. ? Cloth face coverings should not be placed on young children under age 2, anyone who has trouble breathing, or is unconscious,  incapacitated or otherwise unable to remove the mask without assistance.  The cloth face cover is meant to protect other people in case you are infected.  Do NOT use a facemask meant for a healthcare worker.  Continue to keep about 6 feet between yourself and others. The cloth face cover is not a substitute for social distancing. Cover coughs and sneezes  If you are in a private setting and do not have on your cloth face covering, remember to always cover your mouth and nose with a tissue when you cough or sneeze or use the inside of your elbow.  Throw used tissues in the trash.  Immediately wash your hands with soap and water for at least 20 seconds. If soap and water are not readily available, clean your hands with a hand sanitizer that contains at least 60% alcohol. Clean and disinfect  Clean AND disinfect frequently touched surfaces daily. This includes tables, doorknobs, light switches, countertops, handles, desks, phones, keyboards, toilets, faucets, and sinks. www.cdc.gov/coronavirus/2019-ncov/prevent-getting-sick/disinfecting-your-home.html  If surfaces are dirty, clean them: Use detergent or soap and water prior to disinfection.  Then, use a household disinfectant. You can see a list of EPA-registered household disinfectants here. cdc.gov/coronavirus 11/12/2018 This information is not intended to replace advice given to you by your health care provider. Make sure you discuss any questions you have with your health care provider. Document Released: 10/22/2018 Document Revised: 11/20/2018 Document Reviewed: 10/22/2018 Elsevier Patient Education  2020 [employer] Inc[employer]  

## 2019-03-03 NOTE — Progress Notes (Signed)
The patient is seen today in follow-up of a ionizing radiation burn to the right chest.  The patient continues using Silvadene cream to the area.  The area continues to be painful and is now bleeding.  The patient reports that she does not scrub the area.  Despite this, the area is erythematous and bleeding.  There is no purulent drainage or increased warmth.  There is hyperpigmentation of the area associated with a radiation field.  The patient denies fevers, chills, or sweats.  She continues using hydrocodone and Tylenol for pain.  Sandi Mealy, MHS, PA-C Physician Assistant

## 2019-03-03 NOTE — Telephone Encounter (Signed)
Pt called reporting continued pain at radiation burn site with little relief and concerns about healing.  Pt spoke with PA Lucianne Lei over the phone, agreed to come in 03/03/2019 for f/u with [facility].

## 2019-03-03 NOTE — Telephone Encounter (Signed)
Scheduled appt per 8/24 los.  Patient declined calendar.

## 2019-03-03 NOTE — Progress Notes (Signed)
Pt presents for recheck of radiation burn on R breast.  Since last visit pt reports increased bleeding and tenderness around burn, states she is worried about infection and "just wanted to check in and make sure it's healing even though it doesn't really feel like it".  Pt tearful about concerns but was reassured after talking with RN.  On assessment there is increased bleeding under dressing and decreased yellow drainage, mild edema, warmth, and tenderness.  Bleeding controlled by dressing.  Afebrile.

## 2019-03-05 ENCOUNTER — Other Ambulatory Visit: Payer: Self-pay | Admitting: Gastroenterology

## 2019-03-06 ENCOUNTER — Ambulatory Visit: Payer: BC Managed Care – PPO

## 2019-03-07 ENCOUNTER — Telehealth: Payer: Self-pay | Admitting: Medical

## 2019-03-07 ENCOUNTER — Other Ambulatory Visit: Payer: Self-pay

## 2019-03-07 ENCOUNTER — Inpatient Hospital Stay (HOSPITAL_BASED_OUTPATIENT_CLINIC_OR_DEPARTMENT_OTHER): Payer: BC Managed Care – PPO | Admitting: Medical

## 2019-03-07 ENCOUNTER — Ambulatory Visit: Payer: BC Managed Care – PPO | Admitting: Medical

## 2019-03-07 VITALS — BP 113/67 | HR 77 | Temp 97.8°F | Resp 18 | Ht 64.0 in | Wt 216.6 lb

## 2019-03-07 DIAGNOSIS — T300 Burn of unspecified body region, unspecified degree: Secondary | ICD-10-CM

## 2019-03-07 DIAGNOSIS — M5137 Other intervertebral disc degeneration, lumbosacral region: Secondary | ICD-10-CM

## 2019-03-07 MED ORDER — GABAPENTIN 300 MG PO CAPS: 300.0000 mg | ORAL_CAPSULE | Freq: Two times a day (BID) | ORAL | 2 refills | Status: DC

## 2019-03-07 NOTE — Patient Instructions (Signed)
Burn Care, Adult A burn is an injury to the skin or the tissues under the skin. There are three types of burns:  First degree. These burns may cause the skin to be red and slightly swollen.  Second degree. These burns are very painful and cause the skin to be very red. The skin may also leak fluid, look shiny, and develop blisters.  Third degree. These burns cause permanent damage. They either turn the skin white or black and make it look charred, dry, and leathery. Taking care of your burn properly can help to prevent pain and infection. It can also help the burn to heal more quickly. What are the risks? Complications from burns include:  Damage to the skin.  Reduced blood flow near the injury.  Dead tissue.  Scarring.  Problems with movement, if the burn happened near a joint or on the hands or feet. Severe burns can lead to problems that affect the whole body, such as:  Fluid loss.  Less blood circulating in the body.  Inability to maintain a normal core body temperature (thermoregulation).  Infection.  Shock.  Problems breathing. How to care for a first-degree burn Right after a burn:  Rinse or soak the burn under cool water until the pain stops. Do not put ice on your burn. This can cause more damage.  Lightly cover the burn with a sterile cloth (dressing). Burn care  Follow instructions from your health care provider about: ? How to clean and take care of the burn. ? When to change and remove the dressing.  Check your burn every day for signs of infection. Check for: ? More redness, swelling, or pain. ? Warmth. ? Pus or a bad smell. Medicine  Take over-the-counter and prescription medicines only as told by your health care provider.  If you were prescribed antibiotic medicine, take or apply it as told by your health care provider. Do not stop using the antibiotic even if your condition improves. General instructions  To prevent infection, do not put  butter, oil, or other home remedies on your burn.  Do not rub your burn, even when you are cleaning it.  Protect your burn from the sun. How to care for a second-degree burn Right after a burn:  Rinse or soak the burn under cool water. Do this for several minutes. Do not put ice on your burn. This can cause more damage.  Lightly cover the burn with a sterile cloth (dressing). Burn care  Raise (elevate) the injured area above the level of your heart while sitting or lying down.  Follow instructions from your health care provider about: ? How to clean and take care of the burn. ? When to change and remove the dressing.  Check your burn every day for signs of infection. Check for: ? More redness, swelling, or pain. ? Warmth. ? Pus or a bad smell. Medicine   Take over-the-counter and prescription medicines only as told by your health care provider.  If you were prescribed antibiotic medicine, take or apply it as told by your health care provider. Do not stop using the antibiotic even if your condition improves. General instructions  To prevent infection: ? Do not put butter, oil, or other home remedies on the burn. ? Do not scratch or pick at the burn. ? Do not break any blisters. ? Do not peel skin.  Do not rub your burn, even when you are cleaning it.  Protect your burn from the sun. How   to care for a third-degree burn Right after a burn:  Lightly cover the burn with gauze.  Seek immediate medical attention. Burn care  Raise (elevate) the injured area above the level of your heart while sitting or lying down.  Drink enough fluid to keep your urine clear or pale yellow.  Rest as told by your health care provider. Do not participate in sports or other physical activities until your health care provider approves.  Follow instructions from your health care provider about: ? How to clean and take care of the burn. ? When to change and remove the dressing.  Check  your burn every day for signs of infection. Check for: ? More redness, swelling, or pain. ? Warmth. ? Pus or a bad smell. Medicine  Take over-the-counter and prescription medicines only as told by your health care provider.  If you were prescribed antibiotic medicine, take or apply it as told by your health care provider. Do not stop using the antibiotic even if your condition improves. General instructions  To prevent infection: ? Do not put butter, oil, or other home remedies on the burn. ? Do not scratch or pick at the burn. ? Do not break any blisters. ? Do not peel skin. ? Do not rub your burn, even when you are cleaning it.  Protect your burn from the sun.  Keep all follow-up visits as told by your health care provider. This is important. Contact a health care provider if:  Your condition does not improve.  Your condition gets worse.  You have a fever.  Your burn changes in appearance or develops black or red spots.  Your burn feels warm to the touch.  Your pain is not controlled with medicine. Get help right away if:  You have redness, swelling, or pain at the site of the burn.  You have fluid, blood, or pus coming from your burn.  You have red streaks near the burn.  You have severe pain. This information is not intended to replace advice given to you by your health care provider. Make sure you discuss any questions you have with your health care provider. Document Released: 06/26/2005 Document Revised: 06/08/2017 Document Reviewed: 12/14/2015 Elsevier Patient Education  2020 Elsevier Inc.  

## 2019-03-07 NOTE — Telephone Encounter (Signed)
No los per 8/28. °
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
## 2019-03-11 ENCOUNTER — Other Ambulatory Visit: Payer: Self-pay

## 2019-03-11 ENCOUNTER — Inpatient Hospital Stay: Payer: BC Managed Care – PPO | Attending: Oncology

## 2019-03-11 ENCOUNTER — Inpatient Hospital Stay (HOSPITAL_BASED_OUTPATIENT_CLINIC_OR_DEPARTMENT_OTHER): Payer: BC Managed Care – PPO | Admitting: Adult Health

## 2019-03-11 ENCOUNTER — Inpatient Hospital Stay: Payer: BC Managed Care – PPO

## 2019-03-11 ENCOUNTER — Encounter: Payer: Self-pay | Admitting: *Deleted

## 2019-03-11 ENCOUNTER — Encounter: Payer: Self-pay | Admitting: Adult Health

## 2019-03-11 VITALS — BP 137/63 | HR 64 | Temp 98.0°F | Resp 17 | Ht 64.0 in | Wt 218.7 lb

## 2019-03-11 DIAGNOSIS — Z9011 Acquired absence of right breast and nipple: Secondary | ICD-10-CM | POA: Insufficient documentation

## 2019-03-11 DIAGNOSIS — Z7951 Long term (current) use of inhaled steroids: Secondary | ICD-10-CM | POA: Diagnosis not present

## 2019-03-11 DIAGNOSIS — M8580 Other specified disorders of bone density and structure, unspecified site: Secondary | ICD-10-CM | POA: Diagnosis not present

## 2019-03-11 DIAGNOSIS — R918 Other nonspecific abnormal finding of lung field: Secondary | ICD-10-CM | POA: Insufficient documentation

## 2019-03-11 DIAGNOSIS — Z803 Family history of malignant neoplasm of breast: Secondary | ICD-10-CM | POA: Diagnosis not present

## 2019-03-11 DIAGNOSIS — F419 Anxiety disorder, unspecified: Secondary | ICD-10-CM | POA: Insufficient documentation

## 2019-03-11 DIAGNOSIS — Z170 Estrogen receptor positive status [ER+]: Secondary | ICD-10-CM

## 2019-03-11 DIAGNOSIS — J45909 Unspecified asthma, uncomplicated: Secondary | ICD-10-CM | POA: Insufficient documentation

## 2019-03-11 DIAGNOSIS — G4730 Sleep apnea, unspecified: Secondary | ICD-10-CM | POA: Diagnosis not present

## 2019-03-11 DIAGNOSIS — Z5111 Encounter for antineoplastic chemotherapy: Secondary | ICD-10-CM | POA: Diagnosis not present

## 2019-03-11 DIAGNOSIS — F319 Bipolar disorder, unspecified: Secondary | ICD-10-CM | POA: Diagnosis not present

## 2019-03-11 DIAGNOSIS — Z90710 Acquired absence of both cervix and uterus: Secondary | ICD-10-CM | POA: Insufficient documentation

## 2019-03-11 DIAGNOSIS — Z800 Family history of malignant neoplasm of digestive organs: Secondary | ICD-10-CM | POA: Diagnosis not present

## 2019-03-11 DIAGNOSIS — Z79899 Other long term (current) drug therapy: Secondary | ICD-10-CM | POA: Diagnosis not present

## 2019-03-11 DIAGNOSIS — Z79811 Long term (current) use of aromatase inhibitors: Secondary | ICD-10-CM | POA: Insufficient documentation

## 2019-03-11 DIAGNOSIS — C50411 Malignant neoplasm of upper-outer quadrant of right female breast: Secondary | ICD-10-CM

## 2019-03-11 DIAGNOSIS — C50211 Malignant neoplasm of upper-inner quadrant of right female breast: Secondary | ICD-10-CM | POA: Diagnosis present

## 2019-03-11 DIAGNOSIS — I480 Paroxysmal atrial fibrillation: Secondary | ICD-10-CM | POA: Insufficient documentation

## 2019-03-11 DIAGNOSIS — C50911 Malignant neoplasm of unspecified site of right female breast: Secondary | ICD-10-CM | POA: Diagnosis not present

## 2019-03-11 DIAGNOSIS — Z95828 Presence of other vascular implants and grafts: Secondary | ICD-10-CM

## 2019-03-11 LAB — CBC WITH DIFFERENTIAL/PLATELET
Abs Immature Granulocytes: 0.36 10*3/uL — ABNORMAL HIGH (ref 0.00–0.07)
Basophils Absolute: 0.1 10*3/uL (ref 0.0–0.1)
Basophils Relative: 1 %
Eosinophils Absolute: 0.1 10*3/uL (ref 0.0–0.5)
Eosinophils Relative: 2 %
HCT: 30.3 % — ABNORMAL LOW (ref 36.0–46.0)
Hemoglobin: 9.6 g/dL — ABNORMAL LOW (ref 12.0–15.0)
Immature Granulocytes: 5 %
Lymphocytes Relative: 7 %
Lymphs Abs: 0.5 10*3/uL — ABNORMAL LOW (ref 0.7–4.0)
MCH: 30.8 pg (ref 26.0–34.0)
MCHC: 31.7 g/dL (ref 30.0–36.0)
MCV: 97.1 fL (ref 80.0–100.0)
Monocytes Absolute: 1.1 10*3/uL — ABNORMAL HIGH (ref 0.1–1.0)
Monocytes Relative: 16 %
Neutro Abs: 4.9 10*3/uL (ref 1.7–7.7)
Neutrophils Relative %: 69 %
Platelets: 220 10*3/uL (ref 150–400)
RBC: 3.12 MIL/uL — ABNORMAL LOW (ref 3.87–5.11)
RDW: 14.8 % (ref 11.5–15.5)
WBC: 7 10*3/uL (ref 4.0–10.5)
nRBC: 0.7 % — ABNORMAL HIGH (ref 0.0–0.2)

## 2019-03-11 LAB — COMPREHENSIVE METABOLIC PANEL
ALT: 11 U/L (ref 0–44)
AST: 17 U/L (ref 15–41)
Albumin: 3.1 g/dL — ABNORMAL LOW (ref 3.5–5.0)
Alkaline Phosphatase: 87 U/L (ref 38–126)
Anion gap: 9 (ref 5–15)
BUN: 9 mg/dL (ref 8–23)
CO2: 28 mmol/L (ref 22–32)
Calcium: 8.4 mg/dL — ABNORMAL LOW (ref 8.9–10.3)
Chloride: 107 mmol/L (ref 98–111)
Creatinine, Ser: 0.81 mg/dL (ref 0.44–1.00)
GFR calc Af Amer: >60 mL/min (ref >60)
GFR calc non Af Amer: >60 mL/min (ref >60)
Glucose, Bld: 109 mg/dL — ABNORMAL HIGH (ref 70–99)
Potassium: 4.1 mmol/L (ref 3.5–5.1)
Sodium: 144 mmol/L (ref 135–145)
Total Bilirubin: 0.3 mg/dL (ref 0.3–1.2)
Total Protein: 6 g/dL — ABNORMAL LOW (ref 6.5–8.1)

## 2019-03-11 MED ORDER — PALONOSETRON HCL INJECTION 0.25 MG/5ML
INTRAVENOUS | Status: AC
  Filled 2019-03-11: qty 5

## 2019-03-11 MED ORDER — SODIUM CHLORIDE 0.9 % IV SOLN
600.0000 mg/m2 | Freq: Once | INTRAVENOUS | Status: AC
  Administered 2019-03-11: 1260 mg via INTRAVENOUS
  Filled 2019-03-11: qty 63

## 2019-03-11 MED ORDER — HEPARIN SOD (PORK) LOCK FLUSH 100 UNIT/ML IV SOLN
500.0000 [IU] | Freq: Once | INTRAVENOUS | Status: AC | PRN
  Administered 2019-03-11: 500 [IU]
  Filled 2019-03-11: qty 5

## 2019-03-11 MED ORDER — SODIUM CHLORIDE 0.9% FLUSH
10.0000 mL | INTRAVENOUS | Status: DC | PRN
  Administered 2019-03-11: 10 mL
  Filled 2019-03-11: qty 10

## 2019-03-11 MED ORDER — DEXAMETHASONE SODIUM PHOSPHATE 10 MG/ML IJ SOLN
INTRAMUSCULAR | Status: AC
  Filled 2019-03-11: qty 1

## 2019-03-11 MED ORDER — PALONOSETRON HCL INJECTION 0.25 MG/5ML
0.2500 mg | Freq: Once | INTRAVENOUS | Status: AC
  Administered 2019-03-11: 14:00:00 0.25 mg via INTRAVENOUS

## 2019-03-11 MED ORDER — METHOTREXATE SODIUM (PF) CHEMO INJECTION 250 MG/10ML
39.8000 mg/m2 | Freq: Once | INTRAMUSCULAR | Status: AC
  Administered 2019-03-11: 15:00:00 84 mg via INTRAVENOUS
  Filled 2019-03-11: qty 3.36

## 2019-03-11 MED ORDER — FLUOROURACIL CHEMO INJECTION 2.5 GM/50ML
600.0000 mg/m2 | Freq: Once | INTRAVENOUS | Status: AC
  Administered 2019-03-11: 1250 mg via INTRAVENOUS
  Filled 2019-03-11: qty 25

## 2019-03-11 MED ORDER — DEXAMETHASONE SODIUM PHOSPHATE 10 MG/ML IJ SOLN
10.0000 mg | Freq: Once | INTRAMUSCULAR | Status: AC
  Administered 2019-03-11: 10 mg via INTRAVENOUS

## 2019-03-11 MED ORDER — SODIUM CHLORIDE 0.9 % IV SOLN
Freq: Once | INTRAVENOUS | Status: AC
  Administered 2019-03-11: 14:00:00 via INTRAVENOUS
  Filled 2019-03-11: qty 250

## 2019-03-11 NOTE — Progress Notes (Signed)
[facility]  Telephone:(336) [phone] Fax:(336) [phone]     ID: Doris Smith DOB: [birth year]  MR#: [id number]  UGQ#:[id number]  Patient Care Team: Chevis Pretty, FNP as PCP - General (Nurse Practitioner) Alphonsa Overall, MD as Consulting Physician (General Surgery) Magrinat, Virgie Dad, MD as Consulting Physician (Oncology) Kyung Rudd, MD as Consulting Physician (Radiation Oncology) Elijio Miles, MD as Consulting Physician (Pulmonary Disease) Netta Cedars, MD as Consulting Physician (Orthopedic Surgery) Susa Day, MD as Consulting Physician (Orthopedic Surgery) Janeth Rase, NP as Nurse Practitioner (Adult Health Nurse Practitioner) Mauro Kaufmann, RN as Oncology Nurse Navigator Rockwell Germany, RN as Oncology Nurse Navigator Irene Limbo, MD as Consulting Physician (Plastic Surgery) Scot Dock, NP OTHER MD: Chapman Moss, psychiatry   CHIEF COMPLAINT: Estrogen receptor positive breast cancer  CURRENT TREATMENT: Adjuvant chemotherapy   INTERVAL HISTORY: Doris Smith is seen today for follow-up and treatment of her estrogen receptor positive breast cancer.  She completed adjuvant radiation and is healing from the desquamation that she experienced due to this.    She continues on cyclophosphamide, methotrexate, fluorouracil (CMF), to be repeated every 21 days x 8. Today is day 1 cycle 7.     REVIEW OF SYSTEMS: Doris Smith is fatigued.  She is very happy to have completed radiation, and that her skin is almost 100% healed. She notes that she hasn't been able to walk for exercise with her impaired skin integrity, due to the amount that she sweats. She is looking forward to her skin healing so she can get back to her walking regimen.  She denies any new issues such as fever, chills, chest pain, palpitations, cough headaches, vision changes, nausea, vomiting, bowel/bladder changes, or skin changes.  A detailed ROS was otherwise non contributory.     HISTORY OF CURRENT ILLNESS: From the original intake note:  "Doris Smith" presented to her PCP, Dr. Hassell Done, with right breast pain, fullness, and nipple inversion since November 2019. She proceeded to undergo bilateral diagnostic mammography with tomography and right breast ultrasonography at [facility] on 07/31/2018 showing: findings compatible with multicentric breast cancer spanning at least the upper inner and upper outer quadrants of the right breast; normal right axilla. Palpable firmness of approximately 1.5 cm was noted in the 9 o'clock position, which was also seen on ultrasound with indisctinct margins measuring 2.7 x 2.3 x 2.2 cm. At 1 o'clock, a mass with poorly defined margins measures 1.9 x 1 x 0.8 cm. Additional poorly defined masses were seen in the 12 o'clock retroareolar region.  Accordingly on 08/02/2018 she proceeded to biopsy of the right breast area in question. The pathology ([id number]) from this procedure showed: invasive mammary carcinoma at 9 o'clock and 1 o'clock; e-cadherin negative, consistent with lobular phenotype; grade 2-3. Prognostic indicators significant for: estrogen receptor, 90% positive and progesterone receptor, 1% positive, both with strong staining intensity. Proliferation marker Ki67 at 1%. HER2 equivocal by immunohistochemistry, 2+ but negative by fluorescent in situ hybridization with a signals ratio 1.04 and number per cell 1.2.   The patient's subsequent history is as detailed below.   PAST MEDICAL HISTORY: Past Medical History:  Diagnosis Date  . A-fib ([place]) 11/2012   PAF  . Anxiety    takes Ativan  . Asthma 04/07/2011   dx  . Bipolar disorder ([place])   . Cancer [facility])    Right breast  . Depression   . Dyspnea    DOE  . Early cataracts, bilateral   . Fatty liver   .  Fibromyalgia   . Fracture, ankle 10/2008   right and left  . GERD (gastroesophageal reflux disease)   . Irritable bowel syndrome   . Mental disorder    dx bipolar  . PONV  (postoperative nausea and vomiting)   . Sleep apnea 04/2011    does not wear CPAP  . Wears glasses   She has chronic sinus headaches, hiatal hernia   PAST SURGICAL HISTORY: Past Surgical History:  Procedure Laterality Date  . ABDOMINAL HYSTERECTOMY    . COLONOSCOPY    . DILATION AND CURETTAGE OF UTERUS  1981   abnormal pap  . KNEE ARTHROSCOPY Left 2007   x 2  . LUMBAR LAMINECTOMY/DECOMPRESSION MICRODISCECTOMY  05/31/2011   Procedure: LUMBAR LAMINECTOMY/DECOMPRESSION MICRODISCECTOMY;  Surgeon: Johnn Hai;  Location: [facility];  Service: Orthopedics;  Laterality: Left;  Decompression Lumbar four to five and  Lumbar five to Sacral one on Left  (X-Ray)  . MASTECTOMY W/ SENTINEL NODE BIOPSY Right 09/16/2018   Procedure: RIGHT MASTECTOMY WITH RIGHT AXILLARY SENTINEL LYMPH NODE BIOPSY;  Surgeon: Alphonsa Overall, MD;  Location: [facility];  Service: General;  Laterality: Right;  . PARTIAL HYSTERECTOMY  1982  . PORTACATH PLACEMENT Left 10/31/2018   Procedure: INSERTION PORT-A-CATH WITH ULTRASOUND;  Surgeon: Alphonsa Overall, MD;  Location: [facility];  Service: General;  Laterality: Left;  . TONSILLECTOMY     as child  . TOTAL KNEE ARTHROPLASTY Left 12/18/2005    FAMILY HISTORY Family History  Problem Relation Age of Onset  . Anesthesia problems Mother   . Heart disease Mother        CHF, atrial fib  . Heart failure Mother   . Anesthesia problems Sister   . Colon polyps Father   . Heart disease Father        "Fluid around the heart"  . Hypertension Sister   . Breast cancer Maternal Aunt   . Colon cancer Maternal Aunt   . Prostate cancer Neg Hx   . Ovarian cancer Neg Hx    As of February 2019, patient father is alive at 22 years old. Patient mother is alive at 35 years old. She notes a family hx of breast cancer. A maternal aunt was diagnosed with breast cancer, but Doris Smith is unsure if it was in one or both breasts. She has 3 siblings, 3 sisters and 0 brothers.   GYNECOLOGIC  HISTORY:  No LMP recorded. Patient is postmenopausal. Menarche: 68 years old Age at first live birth: 68 years old [place] P 2 LMP about 42 years ago Contraceptive no HRT no  Hysterectomy? partial So? no   SOCIAL HISTORY: Doris Smith worked in [employer] for 15 years. She is on disability because her back, knees, and nerves. Her husband, [name], has been at [employer] 43 years as a Freight forwarder.  Even though their home is in [place] they are actually living in [place] in an apartment while her husband works there.  Son Legrand Como, age 56, works as a Chief Strategy Officer in [place], [place]. Son Shanon Brow, age 28, works as a Building control surveyor in [place], [place]. They have 4 grandchildren, 2 great-grandchildren with 2 more on the way. She attends a [employer].     ADVANCED DIRECTIVES:    HEALTH MAINTENANCE: Social History   Tobacco Use  . Smoking status: Never Smoker  . Smokeless tobacco: Never Used  Substance Use Topics  . Alcohol use: No  . Drug use: No     Colonoscopy: 2019  PAP: 11/03/2014, normal  Bone density: 10/13/2016, T-score of -2.1   Allergies  Allergen Reactions  . Iohexol     "over 20 years ago" had reaction "hurting in arm and heart"-Done in [place], [place].Marland Kitchenphysician stopped CT at that time.  . Codeine Other (See Comments)    Makes feel like she is wild   . Prednisone Other (See Comments)    Makes me very irritable  . Sulfonamide Derivatives Other (See Comments)    Upset stomach  . Celecoxib Nausea And Vomiting  . Sulfa Antibiotics Nausea And Vomiting  . Vitamin B12 Rash    Current Outpatient Medications  Medication Sig Dispense Refill  . anastrozole (ARIMIDEX) 1 MG tablet     . benzonatate (TESSALON PERLES) 100 MG capsule Take 1 capsule (100 mg total) by mouth 3 (three) times daily as needed for cough. 20 capsule 0  . budesonide-formoterol (SYMBICORT) 160-4.5 MCG/ACT inhaler Inhale 2 puffs into the lungs 2 (two) times daily.    . ciprofloxacin (CIPRO) 500 MG tablet Take 1 tablet (500 mg  total) by mouth 2 (two) times daily. 14 tablet 0  . clotrimazole-betamethasone (LOTRISONE) cream Apply 1 application topically 2 (two) times daily. (Patient taking differently: Apply 1 application topically 2 (two) times daily as needed (rash). ) 30 g 0  . dexamethasone (DECADRON) 4 MG tablet Take 1 tablet (4 mg total) by mouth daily. Start the day after chemotherapy for 2 days. Take with food. 30 tablet 1  . esomeprazole (NEXIUM) 40 MG capsule TAKE 1 CAPSULE BY MOUTH ONCE DAILY BEFORE BREAKFAST 30 capsule 0  . fluticasone (FLONASE) 50 MCG/ACT nasal spray Place 2 sprays into both nostrils daily. 16 g 6  . fluticasone furoate-vilanterol (BREO ELLIPTA) 200-25 MCG/INH AEPB Inhale 1 puff into the lungs daily.    Marland Kitchen gabapentin (NEURONTIN) 300 MG capsule Take 1 capsule (300 mg total) by mouth 2 (two) times daily. 60 capsule 2  . HYDROcodone-acetaminophen (NORCO) 5-325 MG tablet 1/2 to 1 PO Q 4 hours prn pain. 45 tablet 0  . ibuprofen (ADVIL) 200 MG tablet Take 200 mg by mouth every 6 (six) hours as needed.    . lamoTRIgine (LAMICTAL) 100 MG tablet Take 100 mg by mouth 2 (two) times daily.     Marland Kitchen lidocaine-prilocaine (EMLA) cream Apply to affected area once 30 g 3  . LORazepam (ATIVAN) 0.5 MG tablet Take 1 tablet (0.5 mg total) by mouth at bedtime as needed (Nausea or vomiting). 30 tablet 0  . magic mouthwash SOLN Take 5 mLs by mouth 4 (four) times daily as needed for mouth pain. 240 mL 0  . montelukast (SINGULAIR) 10 MG tablet Take 10 mg by mouth at bedtime.    Marland Kitchen omeprazole (PRILOSEC) 20 MG capsule Take 1 capsule (20 mg total) by mouth daily. 30 capsule 0  . prochlorperazine (COMPAZINE) 10 MG tablet Take 1 tablet (10 mg total) by mouth every 6 (six) hours as needed (Nausea or vomiting). 30 tablet 1  . promethazine-dextromethorphan (PROMETHAZINE-DM) 6.25-15 MG/5ML syrup TAKE 5 MILLILITERS BY MOUTH 4 TIMES DAILY AS NEEDED FOR COUGH    . silver sulfADIAZINE (SILVADENE) 1 % cream Apply 1 application topically 2  (two) times daily. 400 g 0  . Vilazodone HCl (VIIBRYD) 40 MG TABS Take 40 mg by mouth daily.     No current facility-administered medications for this visit.     OBJECTIVE: Middle-aged white woman who appears stated age 87:   03/11/19 1238  BP: 137/63  Pulse: 64  Resp: 17  Temp: 98  F (36.7 C)  SpO2: 100%     Body mass index is 37.54 kg/m.   Wt Readings from Last 3 Encounters:  03/11/19 218 lb 11.2 oz (99.2 kg)  03/07/19 216 lb 9.6 oz (98.2 kg)  03/03/19 217 lb 6.4 oz (98.6 kg)  ECOG FS:1 - Symptomatic but completely ambulatory  GENERAL: Patient is a well appearing female in no acute distress HEENT:  Sclerae anicteric.  Oropharynx clear and moist. No ulcerations or evidence of oropharyngeal candidiasis. Neck is supple.  NODES:  No cervical, supraclavicular, or axillary lymphadenopathy palpated.  BREAST EXAM:  Deferred. LUNGS:  Clear to auscultation bilaterally.  No wheezes or rhonchi. HEART:  Regular rate and rhythm. No murmur appreciated. ABDOMEN:  Soft, nontender.  Positive, normoactive bowel sounds. No organomegaly palpated. MSK:  No focal spinal tenderness to palpation. Full range of motion bilaterally in the upper extremities. EXTREMITIES:  No peripheral edema.   SKIN:  Clear with no obvious rashes or skin changes. No nail dyscrasia. NEURO:  Nonfocal. Well oriented.  Appropriate affect.   LAB RESULTS:  CMP     Component Value Date/Time   NA 143 02/18/2019 0808   NA 142 10/13/2016 1045   K 3.9 02/18/2019 0808   CL 106 02/18/2019 0808   CO2 28 02/18/2019 0808   GLUCOSE 131 (H) 02/18/2019 0808   BUN 10 02/18/2019 0808   BUN 8 10/13/2016 1045   CREATININE 0.82 02/18/2019 0808   CREATININE 0.90 12/19/2018 1434   CALCIUM 8.6 (L) 02/18/2019 0808   PROT 6.2 (L) 02/18/2019 0808   PROT 6.3 10/13/2016 1045   ALBUMIN 3.3 (L) 02/18/2019 0808   ALBUMIN 3.9 10/13/2016 1045   AST 17 02/18/2019 0808   AST 14 (L) 08/14/2018 0758   ALT 12 02/18/2019 0808   ALT 10  08/14/2018 0758   ALKPHOS 90 02/18/2019 0808   BILITOT 0.2 (L) 02/18/2019 0808   BILITOT 0.5 08/14/2018 0758   GFRNONAA >60 02/18/2019 0808   GFRNONAA >60 12/19/2018 1434   GFRAA >60 02/18/2019 0808   GFRAA >60 12/19/2018 1434    No results found for: TOTALPROTELP, ALBUMINELP, A1GS, A2GS, BETS, BETA2SER, GAMS, MSPIKE, SPEI  No results found for: KPAFRELGTCHN, LAMBDASER, KAPLAMBRATIO  Lab Results  Component Value Date   WBC 7.0 03/11/2019   NEUTROABS 4.9 03/11/2019   HGB 9.6 (L) 03/11/2019   HCT 30.3 (L) 03/11/2019   MCV 97.1 03/11/2019   PLT 220 03/11/2019    '@LASTCHEMISTRY' @  No results found for: LABCA2  No components found for: [id number]  No results for input(s): INR in the last 168 hours.  No results found for: LABCA2  No results found for: OLI103  No results found for: UDT143  No results found for: OOI757  No results found for: CA2729  No components found for: HGQUANT  No results found for: CEA1 / No results found for: CEA1   No results found for: AFPTUMOR  No results found for: CHROMOGRNA  No results found for: PSA1  Appointment on 03/11/2019  Component Date Value Ref Range Status  . WBC 03/11/2019 7.0  4.0 - 10.5 K/uL Final  . RBC 03/11/2019 3.12* 3.87 - 5.11 MIL/uL Final  . Hemoglobin 03/11/2019 9.6* 12.0 - 15.0 g/dL Final  . HCT 03/11/2019 30.3* 36.0 - 46.0 % Final  . MCV 03/11/2019 97.1  80.0 - 100.0 fL Final  . MCH 03/11/2019 30.8  26.0 - 34.0 pg Final  . MCHC 03/11/2019 31.7  30.0 - 36.0 g/dL Final  . RDW  03/11/2019 14.8  11.5 - 15.5 % Final  . Platelets 03/11/2019 220  150 - 400 K/uL Final  . nRBC 03/11/2019 0.7* 0.0 - 0.2 % Final  . Neutrophils Relative % 03/11/2019 69  % Final  . Neutro Abs 03/11/2019 4.9  1.7 - 7.7 K/uL Final  . Lymphocytes Relative 03/11/2019 7  % Final  . Lymphs Abs 03/11/2019 0.5* 0.7 - 4.0 K/uL Final  . Monocytes Relative 03/11/2019 16  % Final  . Monocytes Absolute 03/11/2019 1.1* 0.1 - 1.0 K/uL Final  .  Eosinophils Relative 03/11/2019 2  % Final  . Eosinophils Absolute 03/11/2019 0.1  0.0 - 0.5 K/uL Final  . Basophils Relative 03/11/2019 1  % Final  . Basophils Absolute 03/11/2019 0.1  0.0 - 0.1 K/uL Final  . Immature Granulocytes 03/11/2019 5  % Final  . Abs Immature Granulocytes 03/11/2019 0.36* 0.00 - 0.07 K/uL Final   Performed at [facility], [address] [place], [place] [address]    (this displays the last labs from the last 3 days)  No results found for: TOTALPROTELP, ALBUMINELP, A1GS, A2GS, BETS, BETA2SER, GAMS, MSPIKE, SPEI (this displays SPEP labs)  No results found for: KPAFRELGTCHN, LAMBDASER, KAPLAMBRATIO (kappa/lambda light chains)  No results found for: HGBA, HGBA2QUANT, HGBFQUANT, HGBSQUAN (Hemoglobinopathy evaluation)   No results found for: LDH  No results found for: IRON, TIBC, IRONPCTSAT (Iron and TIBC)  No results found for: FERRITIN  Urinalysis    Component Value Date/Time   COLORURINE YELLOW 12/19/2018 1355   APPEARANCEUR HAZY (A) 12/19/2018 1355   APPEARANCEUR Clear 05/03/2018 1204   LABSPEC 1.016 12/19/2018 1355   PHURINE 5.0 12/19/2018 [place] 12/19/2018 [place] 12/19/2018 [place] 12/19/2018 1355   BILIRUBINUR Negative 05/03/2018 1204   KETONESUR NEGATIVE 12/19/2018 1355   PROTEINUR NEGATIVE 12/19/2018 1355   UROBILINOGEN negative 11/03/2014 1039   UROBILINOGEN 0.2 05/25/2011 0923   NITRITE NEGATIVE 12/19/2018 1355   LEUKOCYTESUR SMALL (A) 12/19/2018 1355     STUDIES: No results found.  ELIGIBLE FOR AVAILABLE RESEARCH PROTOCOL: no   ASSESSMENT: 68 y.o. [place], [place] woman status post right breast biopsy 08/02/2018 for a clinical T3 N0, stage IIA invasive lobular carcinoma, grade 2, estrogen receptor strongly positive, progesterone receptor 1% positive, with no HER-2 amplification and an MIB-1 of 1%.  (a) CT scan of the head and chest, without contrast  08/23/2018 showed nonspecific 0.4 cm left lower lobe pulmonary nodule, no definitive metastatic disease  (b) bone scan 08/23/2018 shows multiple spinal areas of abnormal uptake, but  (c) total spinal MRI 09/03/2018 finds bone scan findings to be secondary to degenerative disease, no evidence of metastatic disease.  (1) status post right mastectomy and sentinel lymph node sampling 09/16/2018 showing a pT3 pN1(mic), stage IIA-IIIA invasive lobular breast cancer, grade 2, with 1 of 5 sampled lymph nodes involved by micrometastatic deposit, with extra nodular extension; ample margins  (2)  MammaPrint "high risk" suggests a 5-year metastasis free survival of 93% with chemotherapy, with an absolute chemotherapy benefit in the greater than 12% range  (3) adjuvant radiation to follow (can be done concurrently with chemotherapy)  (4) anastrozole started neoadjuvantly (on 08/14/2018), discontinued 10/14/2018 in preparation for chemotherapy  (5) Caris requested 10/14/2018  (6) adjuvant chemotherapy consisting of cyclophosphamide, methotrexate, fluorouracil (CMF) started 11/05/2018, to be repeated every 21 days x 8   PLAN: Doris Smith is tolerating the CMF well.  Other than fatigue, her labs have  remained stable, and she will continue to receive this every 3 weeks for a total of 8 cycles with her seventh dose being due today.  She has an injection appointment on 03/13/2019.    Cindy and I reviewed her skin and I am pleased that it is almost entirely healed.  She will continue to use the cream as needed to the area.  Cindy asked me when she should start anastrozole, I recommended that she review it with Dr. Jana Hakim at her next visit in 3 weeks.    Doris Smith will return in 2 days for her Neulasta and in 3 weeks for labs, f/u and her final chemotherapy.  She was recommended to continue with the appropriate pandemic precautions. She knows to call for any questions that may arise between now and her next appointment.   We are happy to see her sooner if needed.  A total of (20) minutes of face-to-face time was spent with this patient with greater than 50% of that time in counseling and care-coordination.   Wilber Bihari, NP 03/11/19 12:58 PM Medical Oncology and Hematology [facility] [address] [place], [place] [address] Tel. [phone]    Fax. [phone]

## 2019-03-11 NOTE — Patient Instructions (Addendum)
[facility] Discharge Instructions for Patients Receiving Chemotherapy  Today you received the following chemotherapy agents: Cytoxan, 5 FU and Methotrexate  To help prevent nausea and vomiting after your treatment, we encourage you to take your nausea medication as directed  If you develop nausea and vomiting that is not controlled by your nausea medication, call the clinic.   BELOW ARE SYMPTOMS THAT SHOULD BE REPORTED IMMEDIATELY:  *FEVER GREATER THAN 100.5 F  *CHILLS WITH OR WITHOUT FEVER  NAUSEA AND VOMITING THAT IS NOT CONTROLLED WITH YOUR NAUSEA MEDICATION  *UNUSUAL SHORTNESS OF BREATH  *UNUSUAL BRUISING OR BLEEDING  TENDERNESS IN MOUTH AND THROAT WITH OR WITHOUT PRESENCE OF ULCERS  *URINARY PROBLEMS  *BOWEL PROBLEMS  UNUSUAL RASH Items with * indicate a potential emergency and should be followed up as soon as possible.  Feel free to call the clinic should you have any questions or concerns. The clinic phone number is [phone].  Please show the [facility] at check-in to the Emergency Department and triage nurse.

## 2019-03-13 ENCOUNTER — Inpatient Hospital Stay: Payer: BC Managed Care – PPO

## 2019-03-14 ENCOUNTER — Telehealth: Payer: Self-pay | Admitting: Oncology

## 2019-03-14 NOTE — Telephone Encounter (Signed)
R/s appt per - 9/2 sch message - unable to reach pt/ no vmail.  - mailed letter -

## 2019-03-19 ENCOUNTER — Telehealth: Payer: Self-pay | Admitting: Radiation Oncology

## 2019-03-19 NOTE — Progress Notes (Signed)
  Radiation Oncology         [phone] ________________________________  Name: Doris Smith MRN: [id number]  Date: 02/19/2019  DOB: 02-Oct-1950  End of Treatment Note  Diagnosis:   right-sided breast cancer     Indication for treatment:  Curative       Radiation treatment dates:   12/31/2018 through 02/19/2019  Site/dose:   The patient initially received a dose of 50.4 Gy in 28 fractions to the chest wall and supraclavicular region. This was delivered using a 3-D conformal, 4 field technique. The patient then received a boost to the mastectomy scar. This delivered an additional 10 Gy in 5 fractions using an en face electron field. The total dose was 60.4 Gy.  Narrative: The patient tolerated radiation treatment relatively well.   The patient had some  skin irritation as she progressed during treatment.  This resulted in diffuse erythema with dry desquamation towards the end of treatment  Plan: The patient has completed radiation treatment. The patient will return to radiation oncology clinic for routine followup in one month. I advised the patient to call or return sooner if they have any questions or concerns related to their recovery or treatment. ________________________________  Jodelle Gross, M.D., Ph.D.

## 2019-03-19 NOTE — Telephone Encounter (Signed)
  Radiation Oncology         [phone]) [phone] ________________________________  Name: Doris Smith MRN: [id number]  Date of Service: 03/19/2019  DOB: 1950-11-24  Post Treatment Telephone Note  Diagnosis:  Stage IB, pT3N49miM0, grade 2 ,ER/PR positive invasive lobular carcinoma of the right breast.  Interval Since Last Radiation:  4 weeks   12/31/2018-02/19/2019:  The right chest wall and regional nodes were treated to 50.4 Gy in 28 fractions followed by a 10 Gy boost over 5 fractions.   Narrative:  The patient was contacted today for routine follow-up.  She did have a lot of trouble with emotional ups and downs during treatment which has been felt to be attributed to her bipolar disorder. She did have some dry desquamation toward the end of treatment but otherwise did quite well.   Impression/Plan: 1. Stage IB, pT3N26miM0, grade 2 ,ER/PR positive invasive lobular carcinoma of the right breast. The patient has been doing well since completion of radiotherapy. I called that we would be happy to continue to follow her as needed, but she will also continue to follow up with Dr. Jana Hakim in medical oncology while she receives CMF. She was counseled on skin care as well as measures to avoid sun exposure to this area.       Carola Rhine, PAC

## 2019-03-20 ENCOUNTER — Telehealth: Payer: Self-pay | Admitting: *Deleted

## 2019-03-20 NOTE — Telephone Encounter (Signed)
This RN received per fax FMLA forms for pt's husband, [name], from [place].  This RN promptly completed FMLA due to forms received 02/04/2019 missing and never received to [facility].  Note this RN placed dates for FMLA from time of pt's dx and start of treatment in April 2020 to Jul 11 2019.  Faxed to [facility] with verification as received.

## 2019-03-27 ENCOUNTER — Telehealth: Payer: Self-pay | Admitting: *Deleted

## 2019-03-27 NOTE — Telephone Encounter (Signed)
This RN spoke with pt per her call stating she has been having pain " right where the arm connects into the body " on her left side.  She feels it is related to the " having to keep my arm above my head for the radiation."  She states pain is dull but constant.   She denies any swelling.  " just feels weak "  She states she uses tylenol and advil with minimal benefit.  This RN discussed above- including possible nerve irritation per positioning.  Discussed possible need to be seen by PT- but [name] is reluctant to go to other appointments at this time. Home therapy discussed with [name] stating " I'm doing my stretches "  [name] states she is taking the gabapentin " but that doesn't seem to be helping "  She has hydrocodone/apap " but I ain't taken it "  This RN discussed use of pain med to decrease pain - especially if it interfering with her sleep.  She states she will try using it.

## 2019-04-01 ENCOUNTER — Ambulatory Visit: Payer: BC Managed Care – PPO | Admitting: Oncology

## 2019-04-01 ENCOUNTER — Ambulatory Visit: Payer: BC Managed Care – PPO

## 2019-04-01 ENCOUNTER — Other Ambulatory Visit: Payer: BC Managed Care – PPO

## 2019-04-01 NOTE — Progress Notes (Signed)
[place]  [phone] [phone]     ID: Doris Smith DOB: [birth year]  MR#: [id number]  MIW#:[id number]  Patient Care Team: Chevis Pretty, FNP as PCP - General (Nurse Practitioner) Alphonsa Overall, MD as Consulting Physician (General Surgery) Afnan Cadiente, Virgie Dad, MD as Consulting Physician (Oncology) Kyung Rudd, MD as Consulting Physician (Radiation Oncology) Elijio Miles, MD as Consulting Physician (Pulmonary Disease) Netta Cedars, MD as Consulting Physician (Orthopedic Surgery) Susa Day, MD as Consulting Physician (Orthopedic Surgery) Janeth Rase, NP as Nurse Practitioner (Adult Health Nurse Practitioner) Mauro Kaufmann, RN as Oncology Nurse Navigator Rockwell Germany, RN as Oncology Nurse Navigator Irene Limbo, MD as Consulting Physician (Plastic Surgery) Chauncey Cruel, MD OTHER MD: Chapman Moss, psychiatry   CHIEF COMPLAINT: Estrogen receptor positive breast cancer  CURRENT TREATMENT: Completing adjuvant chemotherapy   INTERVAL HISTORY: [name] returns today for follow-up and treatment of her estrogen receptor positive breast cancer. She was last seen here on 03/11/2019.   She continues on adjuvant chemotherapy consisting of cyclophosphamide, methotrexate, fluorouracil (CMF) to be repeated every 21 days x 8.Today is day 1 cycle 8.  She is very excited at completing her treatment and is ready to "ring the bell".  She tells me her last treatment, the seventh, was rough and she knows that this 1 will be similar since we have added the methotrexate back.  She also gets a headache for about 2 days after treatment.  She uses Tylenol for this  Since her last visit here, she has not undergone any additional studies.   REVIEW OF SYSTEMS: Doris Smith is not exercising regularly although she has begun to do some housecleaning of her medicine house (recall they are staying in [facility] where her husband works).  She is  planning to take morning walks and start chair exercises now that the chemotherapy is over.  She has a couple of bras but no prostheses yet.  She is discussing reconstruction with [facility] and thinks possibly late this year or early next year she could get that accomplished.  A detailed review of systems today was otherwise stable   HISTORY OF CURRENT ILLNESS: From the original intake note:  "Doris Smith" presented to her PCP, Dr. Hassell Done, with right breast pain, fullness, and nipple inversion since November 2019. She proceeded to undergo bilateral diagnostic mammography with tomography and right breast ultrasonography at [facility] on 07/31/2018 showing: findings compatible with multicentric breast cancer spanning at least the upper inner and upper outer quadrants of the right breast; normal right axilla. Palpable firmness of approximately 1.5 cm was noted in the 9 o'clock position, which was also seen on ultrasound with indisctinct margins measuring 2.7 x 2.3 x 2.2 cm. At 1 o'clock, a mass with poorly defined margins measures 1.9 x 1 x 0.8 cm. Additional poorly defined masses were seen in the 12 o'clock retroareolar region.  Accordingly on 08/02/2018 she proceeded to biopsy of the right breast area in question. The pathology ([id number]) from this procedure showed: invasive mammary carcinoma at 9 o'clock and 1 o'clock; e-cadherin negative, consistent with lobular phenotype; grade 2-3. Prognostic indicators significant for: estrogen receptor, 90% positive and progesterone receptor, 1% positive, both with strong staining intensity. Proliferation marker Ki67 at 1%. HER2 equivocal by immunohistochemistry, 2+ but negative by fluorescent in situ hybridization with a signals ratio 1.04 and number per cell 1.2.   The patient's subsequent history is as detailed below.   PAST MEDICAL HISTORY: Past Medical History:  Diagnosis Date  .  A-fib ([place]) 11/2012   PAF  . Anxiety    takes Ativan  . Asthma  04/07/2011   dx  . Bipolar disorder ([place])   . Cancer [facility])    Right breast  . Depression   . Dyspnea    DOE  . Early cataracts, bilateral   . Fatty liver   . Fibromyalgia   . Fracture, ankle 10/2008   right and left  . GERD (gastroesophageal reflux disease)   . Irritable bowel syndrome   . Mental disorder    dx bipolar  . PONV (postoperative nausea and vomiting)   . Sleep apnea 04/2011    does not wear CPAP  . Wears glasses   She has chronic sinus headaches, hiatal hernia   PAST SURGICAL HISTORY: Past Surgical History:  Procedure Laterality Date  . ABDOMINAL HYSTERECTOMY    . COLONOSCOPY    . DILATION AND CURETTAGE OF UTERUS  1981   abnormal pap  . KNEE ARTHROSCOPY Left 2007   x 2  . LUMBAR LAMINECTOMY/DECOMPRESSION MICRODISCECTOMY  05/31/2011   Procedure: LUMBAR LAMINECTOMY/DECOMPRESSION MICRODISCECTOMY;  Surgeon: Johnn Hai;  Location: [facility];  Service: Orthopedics;  Laterality: Left;  Decompression Lumbar four to five and  Lumbar five to Sacral one on Left  (X-Ray)  . MASTECTOMY W/ SENTINEL NODE BIOPSY Right 09/16/2018   Procedure: RIGHT MASTECTOMY WITH RIGHT AXILLARY SENTINEL LYMPH NODE BIOPSY;  Surgeon: Alphonsa Overall, MD;  Location: [place];  Service: General;  Laterality: Right;  . PARTIAL HYSTERECTOMY  1982  . PORTACATH PLACEMENT Left 10/31/2018   Procedure: INSERTION PORT-A-CATH WITH ULTRASOUND;  Surgeon: Alphonsa Overall, MD;  Location: [place];  Service: General;  Laterality: Left;  . TONSILLECTOMY     as child  . TOTAL KNEE ARTHROPLASTY Left 12/18/2005    FAMILY HISTORY Family History  Problem Relation Age of Onset  . Anesthesia problems Mother   . Heart disease Mother        CHF, atrial fib  . Heart failure Mother   . Anesthesia problems Sister   . Colon polyps Father   . Heart disease Father        "Fluid around the heart"  . Hypertension Sister   . Breast cancer Maternal Aunt   . Colon cancer Maternal Aunt   . Prostate cancer Neg  Hx   . Ovarian cancer Neg Hx    As of February 2019, patient father is alive at 48 years old. Patient mother is alive at 33 years old. She notes a family hx of breast cancer. A maternal aunt was diagnosed with breast cancer, but Doris Smith is unsure if it was in one or both breasts. She has 3 siblings, 3 sisters and 0 brothers.   GYNECOLOGIC HISTORY:  No LMP recorded. Patient is postmenopausal. Menarche: 68 years old Age at first live birth: 68 years old Omer P 2 LMP about 42 years ago Contraceptive no HRT no  Hysterectomy? partial So? no   SOCIAL HISTORY: Doris Smith worked in [employer] for 15 years. She is on disability because her back, knees, and nerves. Her husband, [name], has been at [employer] 43 years as a Freight forwarder.  Even though their home is in [place] they are actually living in [place] in an apartment while her husband works there.  Son Legrand Como, age 47, works as a Chief Strategy Officer in [place], [place]. Son Shanon Brow, age 43, works as a Building control surveyor in [place], [place]. They have 4 grandchildren, 2 great-grandchildren with 2 more on  the way. She attends a [employer].     ADVANCED DIRECTIVES: In the absence of any documents to the contrary the patient's husband is her healthcare power of attorney   HEALTH MAINTENANCE: Social History   Tobacco Use  . Smoking status: Never Smoker  . Smokeless tobacco: Never Used  Substance Use Topics  . Alcohol use: No  . Drug use: No     Colonoscopy: 2019  PAP: 11/03/2014, normal  Bone density: 10/13/2016, T-score of -2.1   Allergies  Allergen Reactions  . Iohexol     "over 20 years ago" had reaction "hurting in arm and heart"-Done in [place], [place].Marland Kitchenphysician stopped CT at that time.  . Codeine Other (See Comments)    Makes feel like she is wild   . Prednisone Other (See Comments)    Makes me very irritable  . Sulfonamide Derivatives Other (See Comments)    Upset stomach  . Celecoxib Nausea And Vomiting  . Sulfa Antibiotics Nausea And Vomiting  .  Vitamin B12 Rash    Current Outpatient Medications  Medication Sig Dispense Refill  . anastrozole (ARIMIDEX) 1 MG tablet     . benzonatate (TESSALON PERLES) 100 MG capsule Take 1 capsule (100 mg total) by mouth 3 (three) times daily as needed for cough. 20 capsule 0  . budesonide-formoterol (SYMBICORT) 160-4.5 MCG/ACT inhaler Inhale 2 puffs into the lungs 2 (two) times daily.    . clotrimazole-betamethasone (LOTRISONE) cream Apply 1 application topically 2 (two) times daily. (Patient taking differently: Apply 1 application topically 2 (two) times daily as needed (rash). ) 30 g 0  . esomeprazole (NEXIUM) 40 MG capsule TAKE 1 CAPSULE BY MOUTH ONCE DAILY BEFORE BREAKFAST 30 capsule 0  . fluticasone (FLONASE) 50 MCG/ACT nasal spray Place 2 sprays into both nostrils daily. 16 g 6  . fluticasone furoate-vilanterol (BREO ELLIPTA) 200-25 MCG/INH AEPB Inhale 1 puff into the lungs daily.    Marland Kitchen gabapentin (NEURONTIN) 300 MG capsule Take 1 capsule (300 mg total) by mouth 2 (two) times daily. 60 capsule 2  . ibuprofen (ADVIL) 200 MG tablet Take 200 mg by mouth every 6 (six) hours as needed.    . lamoTRIgine (LAMICTAL) 100 MG tablet Take 100 mg by mouth 2 (two) times daily.     Marland Kitchen LORazepam (ATIVAN) 0.5 MG tablet Take 1 tablet (0.5 mg total) by mouth at bedtime as needed (Nausea or vomiting). 30 tablet 0  . magic mouthwash SOLN Take 5 mLs by mouth 4 (four) times daily as needed for mouth pain. 240 mL 0  . montelukast (SINGULAIR) 10 MG tablet Take 10 mg by mouth at bedtime.    Marland Kitchen omeprazole (PRILOSEC) 20 MG capsule Take 1 capsule (20 mg total) by mouth daily. 30 capsule 0  . promethazine-dextromethorphan (PROMETHAZINE-DM) 6.25-15 MG/5ML syrup TAKE 5 MILLILITERS BY MOUTH 4 TIMES DAILY AS NEEDED FOR COUGH    . silver sulfADIAZINE (SILVADENE) 1 % cream Apply 1 application topically 2 (two) times daily. 400 g 0  . Vilazodone HCl (VIIBRYD) 40 MG TABS Take 40 mg by mouth daily.     No current facility-administered  medications for this visit.     OBJECTIVE: Middle-aged white woman in no acute distress  Vitals:   04/02/19 0837  BP: (!) 119/50  Pulse: 73  Resp: 18  Temp: 98.2 F (36.8 C)  SpO2: 100%   Wt Readings from Last 3 Encounters:  04/02/19 219 lb 11.2 oz (99.7 kg)  03/11/19 218 lb 11.2 oz (99.2 kg)  03/07/19 216 lb 9.6 oz (98.2 kg)   Body mass index is 37.71 kg/m.    ECOG FS:1 - Symptomatic but completely ambulatory  Ocular: Sclerae unicteric, pupils round and equal Ear-nose-throat: Wearing a mask Lymphatic: No cervical or supraclavicular adenopathy Lungs no rales or rhonchi Heart regular rate and rhythm Abd soft, nontender, positive bowel sounds MSK no focal spinal tenderness, no joint edema Neuro: non-focal, well-oriented, appropriate affect Breasts: The right breast is status post mastectomy and radiation.  The incision looks very good, flat, no dehiscence, and there is minimal hyperpigmentation.  The left breast is unremarkable.  Both axillae are benign.    LAB RESULTS:  CMP     Component Value Date/Time   NA 144 03/11/2019 1205   NA 142 10/13/2016 1045   K 4.1 03/11/2019 1205   CL 107 03/11/2019 1205   CO2 28 03/11/2019 1205   GLUCOSE 109 (H) 03/11/2019 1205   BUN 9 03/11/2019 1205   BUN 8 10/13/2016 1045   CREATININE 0.81 03/11/2019 1205   CREATININE 0.90 12/19/2018 1434   CALCIUM 8.4 (L) 03/11/2019 1205   PROT 6.0 (L) 03/11/2019 1205   PROT 6.3 10/13/2016 1045   ALBUMIN 3.1 (L) 03/11/2019 1205   ALBUMIN 3.9 10/13/2016 1045   AST 17 03/11/2019 1205   AST 14 (L) 08/14/2018 0758   ALT 11 03/11/2019 1205   ALT 10 08/14/2018 0758   ALKPHOS 87 03/11/2019 1205   BILITOT 0.3 03/11/2019 1205   BILITOT 0.5 08/14/2018 0758   GFRNONAA >60 03/11/2019 1205   GFRNONAA >60 12/19/2018 1434   GFRAA >60 03/11/2019 1205   GFRAA >60 12/19/2018 1434    No results found for: TOTALPROTELP, ALBUMINELP, A1GS, A2GS, BETS, BETA2SER, GAMS, MSPIKE, SPEI  No results found  for: KPAFRELGTCHN, LAMBDASER, KAPLAMBRATIO  Lab Results  Component Value Date   WBC 4.8 04/02/2019   NEUTROABS PENDING 04/02/2019   HGB 10.2 (L) 04/02/2019   HCT 32.1 (L) 04/02/2019   MCV 94.7 04/02/2019   PLT 226 04/02/2019    '@LASTCHEMISTRY' @  No results found for: LABCA2  No components found for: [id number]  No results for input(s): INR in the last 168 hours.  No results found for: LABCA2  No results found for: UKG254  No results found for: YHC623  No results found for: JSE831  No results found for: CA2729  No components found for: HGQUANT  No results found for: CEA1 / No results found for: CEA1   No results found for: AFPTUMOR  No results found for: CHROMOGRNA  No results found for: PSA1  Appointment on 04/02/2019  Component Date Value Ref Range Status  . WBC 04/02/2019 4.8  4.0 - 10.5 K/uL Final  . RBC 04/02/2019 3.39* 3.87 - 5.11 MIL/uL Final  . Hemoglobin 04/02/2019 10.2* 12.0 - 15.0 g/dL Final  . HCT 04/02/2019 32.1* 36.0 - 46.0 % Final  . MCV 04/02/2019 94.7  80.0 - 100.0 fL Final  . MCH 04/02/2019 30.1  26.0 - 34.0 pg Final  . MCHC 04/02/2019 31.8  30.0 - 36.0 g/dL Final  . RDW 04/02/2019 14.4  11.5 - 15.5 % Final  . Platelets 04/02/2019 226  150 - 400 K/uL Final  . nRBC 04/02/2019 0.8* 0.0 - 0.2 % Final   Performed at [facility], [place] [address] [place], [place] [address]  . Neutrophils Relative % 04/02/2019 PENDING  % Incomplete  . Neutro Abs 04/02/2019 PENDING  1.7 - 7.7 K/uL Incomplete  . Band Neutrophils 04/02/2019  PENDING  % Incomplete  . Lymphocytes Relative 04/02/2019 PENDING  % Incomplete  . Lymphs Abs 04/02/2019 PENDING  0.7 - 4.0 K/uL Incomplete  . Monocytes Relative 04/02/2019 PENDING  % Incomplete  . Monocytes Absolute 04/02/2019 PENDING  0.1 - 1.0 K/uL Incomplete  . Eosinophils Relative 04/02/2019 PENDING  % Incomplete  . Eosinophils Absolute 04/02/2019 PENDING  0.0 - 0.5 K/uL Incomplete  . Basophils  Relative 04/02/2019 PENDING  % Incomplete  . Basophils Absolute 04/02/2019 PENDING  0.0 - 0.1 K/uL Incomplete  . WBC Morphology 04/02/2019 PENDING   Incomplete  . RBC Morphology 04/02/2019 PENDING   Incomplete  . Smear Review 04/02/2019 PENDING   Incomplete  . Other 04/02/2019 PENDING  % Incomplete  . nRBC 04/02/2019 PENDING  0 /100 WBC Incomplete  . Metamyelocytes Relative 04/02/2019 PENDING  % Incomplete  . Myelocytes 04/02/2019 PENDING  % Incomplete  . Promyelocytes Relative 04/02/2019 PENDING  % Incomplete  . Blasts 04/02/2019 PENDING  % Incomplete  . Immature Granulocytes 04/02/2019 PENDING  % Incomplete  . Abs Immature Granulocytes 04/02/2019 PENDING  0.00 - 0.07 K/uL Incomplete    (this displays the last labs from the last 3 days)  No results found for: TOTALPROTELP, ALBUMINELP, A1GS, A2GS, BETS, BETA2SER, GAMS, MSPIKE, SPEI (this displays SPEP labs)  No results found for: KPAFRELGTCHN, LAMBDASER, KAPLAMBRATIO (kappa/lambda light chains)  No results found for: HGBA, HGBA2QUANT, HGBFQUANT, HGBSQUAN (Hemoglobinopathy evaluation)   No results found for: LDH  No results found for: IRON, TIBC, IRONPCTSAT (Iron and TIBC)  No results found for: FERRITIN  Urinalysis    Component Value Date/Time   COLORURINE YELLOW 12/19/2018 1355   APPEARANCEUR HAZY (A) 12/19/2018 1355   APPEARANCEUR Clear 05/03/2018 1204   LABSPEC 1.016 12/19/2018 1355   PHURINE 5.0 12/19/2018 [place] 12/19/2018 [place] 12/19/2018 [place] 12/19/2018 1355   BILIRUBINUR Negative 05/03/2018 1204   KETONESUR NEGATIVE 12/19/2018 1355   PROTEINUR NEGATIVE 12/19/2018 1355   UROBILINOGEN negative 11/03/2014 1039   UROBILINOGEN 0.2 05/25/2011 0923   NITRITE NEGATIVE 12/19/2018 1355   LEUKOCYTESUR SMALL (A) 12/19/2018 1355     STUDIES: No results found.  ELIGIBLE FOR AVAILABLE RESEARCH PROTOCOL: no   ASSESSMENT: 68 y.o. [place], [place] woman status post  right breast biopsy 08/02/2018 for a clinical T3 N0, stage IIA invasive lobular carcinoma, grade 2, estrogen receptor strongly positive, progesterone receptor 1% positive, with no HER-2 amplification and an MIB-1 of 1%.  (a) CT scan of the head and chest, without contrast 08/23/2018 showed nonspecific 0.4 cm left lower lobe pulmonary nodule, no definitive metastatic disease  (b) bone scan 08/23/2018 shows multiple spinal areas of abnormal uptake, but  (c) total spinal MRI 09/03/2018 finds bone scan findings to be secondary to degenerative disease, no evidence of metastatic disease.  (1) status post right mastectomy and sentinel lymph node sampling 09/16/2018 showing a pT3 pN1(mic), stage IIA-IIIA invasive lobular breast cancer, grade 2, with 1 of 5 sampled lymph nodes involved by micrometastatic deposit, with extra nodular extension; ample margins  (2)  MammaPrint "high risk" suggests a 5-year metastasis free survival of 93% with chemotherapy, with an absolute chemotherapy benefit in the greater than 12% range  (3) adjuvant radiation 12/31/2018-02/19/2019:  The right chest wall and regional nodes were treated to 50.4 Gy in 28 fractions followed by a 10 Gy boost over 5 fractions.   (4) anastrozole started neoadjuvantly (on 08/14/2018), discontinued 10/14/2018 in preparation for chemotherapy, resumed October 2020  (  a) bone density 10/13/2016 showed osteopenia with a lowest score at -2.1  (5) [facility]is testing on mastectomy sample (09/16/2018) showed stable MSI and proficient mismatch repair status, with a low mutational burden; BRCA 1 and 2 were not mutated, PI K3 was not mutated, ER B B2 was not mutated, and a KT 1 was not mutated.  The androgen receptor was positive (90% at 2+) and there was a pathogenic PTEN variant an excellent 3 (c.209+1G>A)  (6) adjuvant chemotherapy consisting of cyclophosphamide, methotrexate, fluorouracil (CMF) started 11/05/2018, repeated every 21 days x 8, last dose 03/02/2019   (a) methotrexate was omitted during concurrent radiation (cycles 4, 5 and 6)   PLAN: Doris Smith completes her CMF chemotherapy today.  It has been a struggle for her but she has done a great job at receiving the treatments without any dose reductions or delays.  She has also pretty much healed over from her radiation treatments.  She will start anastrozole mid October.  She has osteopenia and I think she will benefit from Prolia.  We can start that when she returns to see [place] in October.  She will receive this every 6 months for 2 years.  Today we discussed the possible side effects toxicities and complications including the rare case of osteonecrosis of the jaw  She will be seen at [facility] for consideration of reconstruction.  I am delighted that she has done so well.  She knows to call for any other issue that may develop before her next visit here.   Pravin Perezperez, Virgie Dad, MD  04/02/19 9:06 AM Medical Oncology and Hematology [facility] [place], [place] [address] Tel. [phone]    Fax. [phone]  I, Jacqualyn Posey am acting as a Education administrator for Chauncey Cruel, MD.   I, Lurline Del MD, have reviewed the above documentation for accuracy and completeness, and I agree with the above.

## 2019-04-02 ENCOUNTER — Inpatient Hospital Stay (HOSPITAL_BASED_OUTPATIENT_CLINIC_OR_DEPARTMENT_OTHER): Payer: BC Managed Care – PPO | Admitting: Oncology

## 2019-04-02 ENCOUNTER — Inpatient Hospital Stay: Payer: BC Managed Care – PPO

## 2019-04-02 ENCOUNTER — Other Ambulatory Visit: Payer: Self-pay

## 2019-04-02 VITALS — BP 119/50 | HR 73 | Temp 98.2°F | Resp 18 | Ht 64.0 in | Wt 219.7 lb

## 2019-04-02 DIAGNOSIS — F419 Anxiety disorder, unspecified: Secondary | ICD-10-CM | POA: Diagnosis not present

## 2019-04-02 DIAGNOSIS — Z79811 Long term (current) use of aromatase inhibitors: Secondary | ICD-10-CM | POA: Diagnosis not present

## 2019-04-02 DIAGNOSIS — C50211 Malignant neoplasm of upper-inner quadrant of right female breast: Secondary | ICD-10-CM

## 2019-04-02 DIAGNOSIS — C50411 Malignant neoplasm of upper-outer quadrant of right female breast: Secondary | ICD-10-CM

## 2019-04-02 DIAGNOSIS — M8580 Other specified disorders of bone density and structure, unspecified site: Secondary | ICD-10-CM | POA: Diagnosis not present

## 2019-04-02 DIAGNOSIS — Z170 Estrogen receptor positive status [ER+]: Secondary | ICD-10-CM | POA: Diagnosis not present

## 2019-04-02 DIAGNOSIS — I480 Paroxysmal atrial fibrillation: Secondary | ICD-10-CM | POA: Diagnosis not present

## 2019-04-02 DIAGNOSIS — G4730 Sleep apnea, unspecified: Secondary | ICD-10-CM | POA: Diagnosis not present

## 2019-04-02 DIAGNOSIS — Z95828 Presence of other vascular implants and grafts: Secondary | ICD-10-CM

## 2019-04-02 DIAGNOSIS — Z79899 Other long term (current) drug therapy: Secondary | ICD-10-CM | POA: Diagnosis not present

## 2019-04-02 DIAGNOSIS — C50911 Malignant neoplasm of unspecified site of right female breast: Secondary | ICD-10-CM | POA: Diagnosis not present

## 2019-04-02 DIAGNOSIS — Z5111 Encounter for antineoplastic chemotherapy: Secondary | ICD-10-CM | POA: Diagnosis not present

## 2019-04-02 DIAGNOSIS — Z803 Family history of malignant neoplasm of breast: Secondary | ICD-10-CM | POA: Diagnosis not present

## 2019-04-02 DIAGNOSIS — Z90710 Acquired absence of both cervix and uterus: Secondary | ICD-10-CM | POA: Diagnosis not present

## 2019-04-02 DIAGNOSIS — F319 Bipolar disorder, unspecified: Secondary | ICD-10-CM | POA: Diagnosis not present

## 2019-04-02 DIAGNOSIS — J45909 Unspecified asthma, uncomplicated: Secondary | ICD-10-CM | POA: Diagnosis not present

## 2019-04-02 DIAGNOSIS — Z7951 Long term (current) use of inhaled steroids: Secondary | ICD-10-CM | POA: Diagnosis not present

## 2019-04-02 DIAGNOSIS — Z9011 Acquired absence of right breast and nipple: Secondary | ICD-10-CM | POA: Diagnosis not present

## 2019-04-02 DIAGNOSIS — R918 Other nonspecific abnormal finding of lung field: Secondary | ICD-10-CM | POA: Diagnosis not present

## 2019-04-02 LAB — COMPREHENSIVE METABOLIC PANEL
ALT: 10 U/L (ref 0–44)
AST: 17 U/L (ref 15–41)
Albumin: 3.2 g/dL — ABNORMAL LOW (ref 3.5–5.0)
Alkaline Phosphatase: 98 U/L (ref 38–126)
Anion gap: 5 (ref 5–15)
BUN: 7 mg/dL — ABNORMAL LOW (ref 8–23)
CO2: 29 mmol/L (ref 22–32)
Calcium: 8.4 mg/dL — ABNORMAL LOW (ref 8.9–10.3)
Chloride: 109 mmol/L (ref 98–111)
Creatinine, Ser: 0.79 mg/dL (ref 0.44–1.00)
GFR calc Af Amer: >60 mL/min (ref >60)
GFR calc non Af Amer: >60 mL/min (ref >60)
Glucose, Bld: 129 mg/dL — ABNORMAL HIGH (ref 70–99)
Potassium: 3.8 mmol/L (ref 3.5–5.1)
Sodium: 143 mmol/L (ref 135–145)
Total Bilirubin: 0.3 mg/dL (ref 0.3–1.2)
Total Protein: 6 g/dL — ABNORMAL LOW (ref 6.5–8.1)

## 2019-04-02 LAB — CBC WITH DIFFERENTIAL/PLATELET
Abs Immature Granulocytes: 0.31 10*3/uL — ABNORMAL HIGH (ref 0.00–0.07)
Basophils Absolute: 0.1 10*3/uL (ref 0.0–0.1)
Basophils Relative: 2 %
Eosinophils Absolute: 0.1 10*3/uL (ref 0.0–0.5)
Eosinophils Relative: 3 %
HCT: 32.1 % — ABNORMAL LOW (ref 36.0–46.0)
Hemoglobin: 10.2 g/dL — ABNORMAL LOW (ref 12.0–15.0)
Immature Granulocytes: 6 %
Lymphocytes Relative: 11 %
Lymphs Abs: 0.6 10*3/uL — ABNORMAL LOW (ref 0.7–4.0)
MCH: 30.1 pg (ref 26.0–34.0)
MCHC: 31.8 g/dL (ref 30.0–36.0)
MCV: 94.7 fL (ref 80.0–100.0)
Monocytes Absolute: 1.1 10*3/uL — ABNORMAL HIGH (ref 0.1–1.0)
Monocytes Relative: 23 %
Neutro Abs: 2.7 10*3/uL (ref 1.7–7.7)
Neutrophils Relative %: 55 %
Platelets: 226 10*3/uL (ref 150–400)
RBC: 3.39 MIL/uL — ABNORMAL LOW (ref 3.87–5.11)
RDW: 14.4 % (ref 11.5–15.5)
WBC: 4.8 10*3/uL (ref 4.0–10.5)
nRBC: 0.8 % — ABNORMAL HIGH (ref 0.0–0.2)

## 2019-04-02 MED ORDER — SODIUM CHLORIDE 0.9% FLUSH
10.0000 mL | INTRAVENOUS | Status: DC | PRN
  Administered 2019-04-02: 10 mL
  Filled 2019-04-02: qty 10

## 2019-04-02 MED ORDER — METHOTREXATE SODIUM (PF) CHEMO INJECTION 250 MG/10ML
39.8000 mg/m2 | Freq: Once | INTRAMUSCULAR | Status: AC
  Administered 2019-04-02: 84 mg via INTRAVENOUS
  Filled 2019-04-02: qty 3.36

## 2019-04-02 MED ORDER — FLUOROURACIL CHEMO INJECTION 2.5 GM/50ML
600.0000 mg/m2 | Freq: Once | INTRAVENOUS | Status: AC
  Administered 2019-04-02: 1250 mg via INTRAVENOUS
  Filled 2019-04-02: qty 25

## 2019-04-02 MED ORDER — DEXAMETHASONE SODIUM PHOSPHATE 10 MG/ML IJ SOLN
INTRAMUSCULAR | Status: AC
  Filled 2019-04-02: qty 1

## 2019-04-02 MED ORDER — HEPARIN SOD (PORK) LOCK FLUSH 100 UNIT/ML IV SOLN
500.0000 [IU] | Freq: Once | INTRAVENOUS | Status: AC | PRN
  Administered 2019-04-02: 500 [IU]
  Filled 2019-04-02: qty 5

## 2019-04-02 MED ORDER — PALONOSETRON HCL INJECTION 0.25 MG/5ML
INTRAVENOUS | Status: AC
  Filled 2019-04-02: qty 5

## 2019-04-02 MED ORDER — SODIUM CHLORIDE 0.9 % IV SOLN
Freq: Once | INTRAVENOUS | Status: AC
  Administered 2019-04-02: 10:00:00 via INTRAVENOUS
  Filled 2019-04-02: qty 250

## 2019-04-02 MED ORDER — DEXAMETHASONE SODIUM PHOSPHATE 10 MG/ML IJ SOLN
10.0000 mg | Freq: Once | INTRAMUSCULAR | Status: AC
  Administered 2019-04-02: 10 mg via INTRAVENOUS

## 2019-04-02 MED ORDER — PALONOSETRON HCL INJECTION 0.25 MG/5ML
0.2500 mg | Freq: Once | INTRAVENOUS | Status: AC
  Administered 2019-04-02: 0.25 mg via INTRAVENOUS

## 2019-04-02 MED ORDER — SODIUM CHLORIDE 0.9 % IV SOLN
600.0000 mg/m2 | Freq: Once | INTRAVENOUS | Status: AC
  Administered 2019-04-02: 1260 mg via INTRAVENOUS
  Filled 2019-04-02: qty 63

## 2019-04-02 NOTE — Patient Instructions (Signed)
[facility] Discharge Instructions for Patients Receiving Chemotherapy  Today you received the following chemotherapy agents:  Cyclophosphamide, Methatrexate, Fluorouracil  To help prevent nausea and vomiting after your treatment, we encourage you to take your nausea medication as prescribed.   If you develop nausea and vomiting that is not controlled by your nausea medication, call the clinic.   BELOW ARE SYMPTOMS THAT SHOULD BE REPORTED IMMEDIATELY:  *FEVER GREATER THAN 100.5 F  *CHILLS WITH OR WITHOUT FEVER  NAUSEA AND VOMITING THAT IS NOT CONTROLLED WITH YOUR NAUSEA MEDICATION  *UNUSUAL SHORTNESS OF BREATH  *UNUSUAL BRUISING OR BLEEDING  TENDERNESS IN MOUTH AND THROAT WITH OR WITHOUT PRESENCE OF ULCERS  *URINARY PROBLEMS  *BOWEL PROBLEMS  UNUSUAL RASH Items with * indicate a potential emergency and should be followed up as soon as possible.  Feel free to call the clinic should you have any questions or concerns. The clinic phone number is [phone].  Please show the [facility] at check-in to the Emergency Department and triage nurse.

## 2019-04-03 ENCOUNTER — Encounter: Payer: Self-pay | Admitting: *Deleted

## 2019-04-03 ENCOUNTER — Telehealth: Payer: Self-pay | Admitting: Oncology

## 2019-04-03 ENCOUNTER — Inpatient Hospital Stay: Payer: BC Managed Care – PPO

## 2019-04-03 NOTE — Telephone Encounter (Signed)
I could not reach patient regarding schedule voicemail was not set up

## 2019-04-04 ENCOUNTER — Encounter: Payer: Self-pay | Admitting: *Deleted

## 2019-04-08 ENCOUNTER — Other Ambulatory Visit: Payer: Self-pay | Admitting: Gastroenterology

## 2019-04-16 DIAGNOSIS — C50811 Malignant neoplasm of overlapping sites of right female breast: Secondary | ICD-10-CM | POA: Diagnosis not present

## 2019-04-16 DIAGNOSIS — Z170 Estrogen receptor positive status [ER+]: Secondary | ICD-10-CM | POA: Diagnosis not present

## 2019-04-21 ENCOUNTER — Other Ambulatory Visit: Payer: Self-pay | Admitting: Oncology

## 2019-04-22 DIAGNOSIS — F312 Bipolar disorder, current episode manic severe with psychotic features: Secondary | ICD-10-CM | POA: Diagnosis not present

## 2019-04-22 DIAGNOSIS — F4310 Post-traumatic stress disorder, unspecified: Secondary | ICD-10-CM | POA: Diagnosis not present

## 2019-04-22 DIAGNOSIS — F410 Panic disorder [episodic paroxysmal anxiety] without agoraphobia: Secondary | ICD-10-CM | POA: Diagnosis not present

## 2019-04-23 ENCOUNTER — Inpatient Hospital Stay: Payer: BC Managed Care – PPO

## 2019-04-23 ENCOUNTER — Inpatient Hospital Stay (HOSPITAL_BASED_OUTPATIENT_CLINIC_OR_DEPARTMENT_OTHER): Payer: BC Managed Care – PPO | Admitting: Adult Health

## 2019-04-23 ENCOUNTER — Inpatient Hospital Stay: Payer: BC Managed Care – PPO | Attending: Oncology

## 2019-04-23 ENCOUNTER — Encounter: Payer: Self-pay | Admitting: Adult Health

## 2019-04-23 ENCOUNTER — Other Ambulatory Visit: Payer: Self-pay

## 2019-04-23 ENCOUNTER — Telehealth: Payer: Self-pay | Admitting: Nurse Practitioner

## 2019-04-23 VITALS — BP 131/68 | HR 70 | Temp 98.7°F | Resp 18 | Ht 64.0 in | Wt 219.1 lb

## 2019-04-23 DIAGNOSIS — Z9011 Acquired absence of right breast and nipple: Secondary | ICD-10-CM | POA: Insufficient documentation

## 2019-04-23 DIAGNOSIS — C50211 Malignant neoplasm of upper-inner quadrant of right female breast: Secondary | ICD-10-CM | POA: Insufficient documentation

## 2019-04-23 DIAGNOSIS — Z79811 Long term (current) use of aromatase inhibitors: Secondary | ICD-10-CM | POA: Diagnosis not present

## 2019-04-23 DIAGNOSIS — Z23 Encounter for immunization: Secondary | ICD-10-CM | POA: Insufficient documentation

## 2019-04-23 DIAGNOSIS — Z170 Estrogen receptor positive status [ER+]: Secondary | ICD-10-CM

## 2019-04-23 DIAGNOSIS — C50411 Malignant neoplasm of upper-outer quadrant of right female breast: Secondary | ICD-10-CM

## 2019-04-23 DIAGNOSIS — Z95828 Presence of other vascular implants and grafts: Secondary | ICD-10-CM

## 2019-04-23 LAB — CBC WITH DIFFERENTIAL/PLATELET
Abs Immature Granulocytes: 0.36 10*3/uL — ABNORMAL HIGH (ref 0.00–0.07)
Basophils Absolute: 0.1 10*3/uL (ref 0.0–0.1)
Basophils Relative: 1 %
Eosinophils Absolute: 0.1 10*3/uL (ref 0.0–0.5)
Eosinophils Relative: 2 %
HCT: 33.1 % — ABNORMAL LOW (ref 36.0–46.0)
Hemoglobin: 10.6 g/dL — ABNORMAL LOW (ref 12.0–15.0)
Immature Granulocytes: 7 %
Lymphocytes Relative: 11 %
Lymphs Abs: 0.6 10*3/uL — ABNORMAL LOW (ref 0.7–4.0)
MCH: 29.9 pg (ref 26.0–34.0)
MCHC: 32 g/dL (ref 30.0–36.0)
MCV: 93.5 fL (ref 80.0–100.0)
Monocytes Absolute: 1.2 10*3/uL — ABNORMAL HIGH (ref 0.1–1.0)
Monocytes Relative: 22 %
Neutro Abs: 3.2 10*3/uL (ref 1.7–7.7)
Neutrophils Relative %: 57 %
Platelets: 254 10*3/uL (ref 150–400)
RBC: 3.54 MIL/uL — ABNORMAL LOW (ref 3.87–5.11)
RDW: 14.3 % (ref 11.5–15.5)
WBC: 5.5 10*3/uL (ref 4.0–10.5)
nRBC: 0.5 % — ABNORMAL HIGH (ref 0.0–0.2)

## 2019-04-23 LAB — COMPREHENSIVE METABOLIC PANEL
ALT: 11 U/L (ref 0–44)
AST: 19 U/L (ref 15–41)
Albumin: 3.4 g/dL — ABNORMAL LOW (ref 3.5–5.0)
Alkaline Phosphatase: 104 U/L (ref 38–126)
Anion gap: 12 (ref 5–15)
BUN: 9 mg/dL (ref 8–23)
CO2: 28 mmol/L (ref 22–32)
Calcium: 8.4 mg/dL — ABNORMAL LOW (ref 8.9–10.3)
Chloride: 105 mmol/L (ref 98–111)
Creatinine, Ser: 0.89 mg/dL (ref 0.44–1.00)
GFR calc Af Amer: >60 mL/min (ref >60)
GFR calc non Af Amer: >60 mL/min (ref >60)
Glucose, Bld: 91 mg/dL (ref 70–99)
Potassium: 4 mmol/L (ref 3.5–5.1)
Sodium: 145 mmol/L (ref 135–145)
Total Bilirubin: 0.3 mg/dL (ref 0.3–1.2)
Total Protein: 6.3 g/dL — ABNORMAL LOW (ref 6.5–8.1)

## 2019-04-23 MED ORDER — INFLUENZA VAC A&B SA ADJ QUAD 0.5 ML IM PRSY
PREFILLED_SYRINGE | INTRAMUSCULAR | Status: AC
  Filled 2019-04-23: qty 0.5

## 2019-04-23 MED ORDER — ANASTROZOLE 1 MG PO TABS: 1.0000 mg | ORAL_TABLET | Freq: Every day | ORAL | 3 refills | Status: DC

## 2019-04-23 MED ORDER — INFLUENZA VAC A&B SA ADJ QUAD 0.5 ML IM PRSY
0.5000 mL | PREFILLED_SYRINGE | Freq: Once | INTRAMUSCULAR | Status: AC
  Administered 2019-04-23: 0.5 mL via INTRAMUSCULAR

## 2019-04-23 MED ORDER — DENOSUMAB 60 MG/ML ~~LOC~~ SOSY: 60.0000 mg | PREFILLED_SYRINGE | Freq: Once | SUBCUTANEOUS | Status: DC

## 2019-04-23 MED ORDER — HEPARIN SOD (PORK) LOCK FLUSH 100 UNIT/ML IV SOLN
500.0000 [IU] | Freq: Once | INTRAVENOUS | Status: AC
  Administered 2019-04-23: 500 [IU]
  Filled 2019-04-23: qty 5

## 2019-04-23 MED ORDER — SODIUM CHLORIDE 0.9% FLUSH
10.0000 mL | INTRAVENOUS | Status: DC | PRN
  Administered 2019-04-23: 12:00:00 10 mL
  Filled 2019-04-23: qty 10

## 2019-04-23 NOTE — Patient Instructions (Signed)
Anastrozole tablets What is this medicine? ANASTROZOLE (an AS troe zole) is used to treat breast cancer in women who have gone through menopause. Some types of breast cancer depend on estrogen to grow, and this medicine can stop tumor growth by blocking estrogen production. This medicine may be used for other purposes; ask your health care provider or pharmacist if you have questions. COMMON BRAND NAME(S): Arimidex What should I tell my health care provider before I take this medicine? They need to know if you have any of these conditions:  bone problems  heart disease  high cholesterol  an unusual or allergic reaction to anastrozole, other medicines, foods, dyes, or preservatives  pregnant or trying to get pregnant  breast-feeding How should I use this medicine? Take this medicine by mouth with a glass of water. Follow the directions on the prescription label. You can take it with or without food. If it upsets your stomach, take it with food. Take your medicine at regular intervals. Do not take it more often than directed. Do not stop taking except on your doctor's advice. Talk to your pediatrician regarding the use of this medicine in children. Special care may be needed. Overdosage: If you think you have taken too much of this medicine contact a poison control center or emergency room at once. NOTE: This medicine is only for you. Do not share this medicine with others. What if I miss a dose? If you miss a dose, take it as soon as you can. If it is almost time for your next dose, take only that dose. Do not take double or extra doses. What may interact with this medicine? This medicine may interact with the following medications:  female hormones, like estrogens or progestins and birth control pills, patches, rings, or injections  tamoxifen This list may not describe all possible interactions. Give your health care provider a list of all the medicines, herbs, non-prescription drugs,  or dietary supplements you use. Also tell them if you smoke, drink alcohol, or use illegal drugs. Some items may interact with your medicine. What should I watch for while using this medicine? Visit your doctor or health care professional for regular checks on your progress. Let your doctor or health care professional know about any unusual vaginal bleeding. Do not become pregnant while taking this medicine or for at least 3 weeks after stopping it. Women should inform their doctor if they wish to become pregnant or think they might be pregnant. There is a potential for serious side effects to an unborn child. Talk to your health care professional or pharmacist for more information. Do not breast-feed an infant while taking this medicine or for 2 weeks after stopping it. This medicine may interfere with the ability to have a child. Talk with your doctor or health care professional if you are concerned about your fertility. Using this medicine for a long time may increase your risk of low bone mass. Talk to your doctor about bone health. You should make sure that you get enough calcium and vitamin D while you are taking this medicine. Discuss the foods you eat and the vitamins you take with your health care professional. What side effects may I notice from receiving this medicine? Side effects that you should report to your doctor or health care professional as soon as possible:  allergic reactions like skin rash, itching or hives, swelling of the face, lips, or tongue  signs and symptoms of a blood clot such as breathing problems;   changes in vision; chest pain; sudden headache; pain, swelling, warmth in the leg; trouble speaking; sudden numbness or weakness of the face, arm, or leg  signs and symptoms of infection like fever or chills; cough; sore throat; pain or trouble passing urine Side effects that usually do not require medical attention (report to your doctor or health care professional if they  continue or are bothersome):  bone pain  dizziness  hair loss  headache  hot flashes  joint pain  muscle pain  signs of decreased red blood cells - unusually weak or tired, feeling faint or lightheaded, falls  vaginal discharge, itching, or odor in women This list may not describe all possible side effects. Call your doctor for medical advice about side effects. You may report side effects to FDA at 1-800-FDA-1088. Where should I keep my medicine? Keep out of the reach of children. Store at room temperature between 20 and 25 degrees C (68 and 77 degrees F). Throw away any unused medicine after the expiration date. NOTE: This sheet is a summary. It may not cover all possible information. If you have questions about this medicine, talk to your doctor, pharmacist, or health care provider.  2020 [employer] (2017-07-09 14:56:51)  

## 2019-04-23 NOTE — Progress Notes (Signed)
[facility]  [phone] [phone]     ID: [name] DOB: 1951/01/19  MR#: [id number]  UVO#:[id number]  Patient Care Team: Chevis Pretty, FNP as PCP - General (Nurse Practitioner) Alphonsa Overall, MD as Consulting Physician (General Surgery) Magrinat, Virgie Dad, MD as Consulting Physician (Oncology) Kyung Rudd, MD as Consulting Physician (Radiation Oncology) Elijio Miles, MD as Consulting Physician (Pulmonary Disease) Netta Cedars, MD as Consulting Physician (Orthopedic Surgery) Susa Day, MD as Consulting Physician (Orthopedic Surgery) Janeth Rase, NP as Nurse Practitioner (Adult Health Nurse Practitioner) Mauro Kaufmann, RN as Oncology Nurse Navigator Rockwell Germany, RN as Oncology Nurse Navigator Irene Limbo, MD as Consulting Physician (Plastic Surgery) Scot Dock, NP OTHER MD: Chapman Moss, psychiatry   CHIEF COMPLAINT: Estrogen receptor positive breast cancer  CURRENT TREATMENT: anti estrogen therapy   INTERVAL HISTORY: [name] returns today for follow-up and treatment of her estrogen receptor positive breast cancer. She is doing well today and is delighted to have completed chemotherapy.  She notes that Anastrozole was sent in for her, but has not yet started taking it daily.    REVIEW OF SYSTEMS: Doris Smith is doing well today.  She notes she is fatigued, and has some continued increased DOE.  She says that it is slowly improving, but she continues to struggle with it.  She denies any other issues such as fever, chills, chest pain, palpitations, cough, nausea, vomiting, bowel bladder changes, skin issues.  A detailed ROS was otherwise non contributory.     HISTORY OF CURRENT ILLNESS: From the original intake note:  "Doris Smith" presented to her PCP, Dr. Hassell Done, with right breast pain, fullness, and nipple inversion since November 2019. She proceeded to undergo bilateral diagnostic mammography with  tomography and right breast ultrasonography at [facility] on 07/31/2018 showing: findings compatible with multicentric breast cancer spanning at least the upper inner and upper outer quadrants of the right breast; normal right axilla. Palpable firmness of approximately 1.5 cm was noted in the 9 o'clock position, which was also seen on ultrasound with indisctinct margins measuring 2.7 x 2.3 x 2.2 cm. At 1 o'clock, a mass with poorly defined margins measures 1.9 x 1 x 0.8 cm. Additional poorly defined masses were seen in the 12 o'clock retroareolar region.  Accordingly on 08/02/2018 she proceeded to biopsy of the right breast area in question. The pathology ([id number]) from this procedure showed: invasive mammary carcinoma at 9 o'clock and 1 o'clock; e-cadherin negative, consistent with lobular phenotype; grade 2-3. Prognostic indicators significant for: estrogen receptor, 90% positive and progesterone receptor, 1% positive, both with strong staining intensity. Proliferation marker Ki67 at 1%. HER2 equivocal by immunohistochemistry, 2+ but negative by fluorescent in situ hybridization with a signals ratio 1.04 and number per cell 1.2.   The patient's subsequent history is as detailed below.   PAST MEDICAL HISTORY: Past Medical History:  Diagnosis Date  . A-fib ([place]) 11/2012   PAF  . Anxiety    takes Ativan  . Asthma 04/07/2011   dx  . Bipolar disorder ([place])   . Cancer [facility])    Right breast  . Depression   . Dyspnea    DOE  . Early cataracts, bilateral   . Fatty liver   . Fibromyalgia   . Fracture, ankle 10/2008   right and left  . GERD (gastroesophageal reflux disease)   . Irritable bowel syndrome   . Mental disorder    dx bipolar  . PONV (postoperative nausea and vomiting)   .  Sleep apnea 04/2011    does not wear CPAP  . Wears glasses   She has chronic sinus headaches, hiatal hernia   PAST SURGICAL HISTORY: Past Surgical History:  Procedure Laterality Date  . ABDOMINAL  HYSTERECTOMY    . COLONOSCOPY    . DILATION AND CURETTAGE OF UTERUS  1981   abnormal pap  . KNEE ARTHROSCOPY Left 2007   x 2  . LUMBAR LAMINECTOMY/DECOMPRESSION MICRODISCECTOMY  05/31/2011   Procedure: LUMBAR LAMINECTOMY/DECOMPRESSION MICRODISCECTOMY;  Surgeon: Johnn Hai;  Location: [facility];  Service: Orthopedics;  Laterality: Left;  Decompression Lumbar four to five and  Lumbar five to Sacral one on Left  (X-Ray)  . MASTECTOMY W/ SENTINEL NODE BIOPSY Right 09/16/2018   Procedure: RIGHT MASTECTOMY WITH RIGHT AXILLARY SENTINEL LYMPH NODE BIOPSY;  Surgeon: Alphonsa Overall, MD;  Location: [facility];  Service: General;  Laterality: Right;  . PARTIAL HYSTERECTOMY  1982  . PORTACATH PLACEMENT Left 10/31/2018   Procedure: INSERTION PORT-A-CATH WITH ULTRASOUND;  Surgeon: Alphonsa Overall, MD;  Location: [facility];  Service: General;  Laterality: Left;  . TONSILLECTOMY     as child  . TOTAL KNEE ARTHROPLASTY Left 12/18/2005    FAMILY HISTORY Family History  Problem Relation Age of Onset  . Anesthesia problems Mother   . Heart disease Mother        CHF, atrial fib  . Heart failure Mother   . Anesthesia problems Sister   . Colon polyps Father   . Heart disease Father        "Fluid around the heart"  . Hypertension Sister   . Breast cancer Maternal Aunt   . Colon cancer Maternal Aunt   . Prostate cancer Neg Hx   . Ovarian cancer Neg Hx    As of February 2019, patient father is alive at 61 years old. Patient mother is alive at 68 years old. She notes a family hx of breast cancer. A maternal aunt was diagnosed with breast cancer, but Doris Smith is unsure if it was in one or both breasts. She has 3 siblings, 3 sisters and 0 brothers.   GYNECOLOGIC HISTORY:  No LMP recorded. Patient is postmenopausal. Menarche: 68 years old Age at first live birth: 68 years old [place] P 2 LMP about 42 years ago Contraceptive no HRT no  Hysterectomy? partial So? no   SOCIAL HISTORY: Doris Smith worked in [employer] for 15 years. She is on disability because her back, knees, and nerves. Her husband, Doris Smith, has been at [employer] 43 years as a Freight forwarder.  Even though their home is in [place] they are actually living in [place] in an apartment while her husband works there.  Son Legrand Como, age 11, works as a Chief Strategy Officer in [place], [place]. Son Shanon Brow, age 42, works as a Building control surveyor in [place], [place]. They have 4 grandchildren, 2 great-grandchildren with 2 more on the way. She attends a [employer].      ADVANCED DIRECTIVES: In the absence of any documents to the contrary the patient's husband is her healthcare power of attorney   HEALTH MAINTENANCE: Social History   Tobacco Use  . Smoking status: Never Smoker  . Smokeless tobacco: Never Used  Substance Use Topics  . Alcohol use: No  . Drug use: No     Colonoscopy: 2019  PAP: 11/03/2014, normal  Bone density: 10/13/2016, T-score of -2.1   Allergies  Allergen Reactions  . Iohexol     "over 20 years ago" had reaction "  hurting in arm and heart"-Done in [place], [place].Marland Kitchenphysician stopped CT at that time.  . Codeine Other (See Comments)    Makes feel like she is wild   . Prednisone Other (See Comments)    Makes me very irritable  . Sulfonamide Derivatives Other (See Comments)    Upset stomach  . Celecoxib Nausea And Vomiting  . Sulfa Antibiotics Nausea And Vomiting  . Vitamin B12 Rash    Current Outpatient Medications  Medication Sig Dispense Refill  . anastrozole (ARIMIDEX) 1 MG tablet Take 1 tablet (1 mg total) by mouth daily. 90 tablet 3  . benzonatate (TESSALON PERLES) 100 MG capsule Take 1 capsule (100 mg total) by mouth 3 (three) times daily as needed for cough. 20 capsule 0  . clotrimazole-betamethasone (LOTRISONE) cream Apply 1 application topically 2 (two) times daily. (Patient taking differently: Apply 1 application topically 2 (two) times daily as needed (rash). ) 30 g 0  . esomeprazole (NEXIUM) 40 MG capsule TAKE 1 CAPSULE BY  MOUTH ONCE DAILY BEFORE BREAKFAST 30 capsule 0  . gabapentin (NEURONTIN) 300 MG capsule Take 1 capsule (300 mg total) by mouth 2 (two) times daily. 60 capsule 2  . lamoTRIgine (LAMICTAL) 100 MG tablet Take 100 mg by mouth 2 (two) times daily.     Marland Kitchen LORazepam (ATIVAN) 0.5 MG tablet Take 1 tablet (0.5 mg total) by mouth at bedtime as needed (Nausea or vomiting). 30 tablet 0  . promethazine-dextromethorphan (PROMETHAZINE-DM) 6.25-15 MG/5ML syrup TAKE 5 MILLILITERS BY MOUTH 4 TIMES DAILY AS NEEDED FOR COUGH    . Vilazodone HCl (VIIBRYD) 40 MG TABS Take 40 mg by mouth daily.     No current facility-administered medications for this visit.     OBJECTIVE:   Vitals:   04/23/19 1248  BP: 131/68  Pulse: 70  Resp: 18  Temp: 98.7 F (37.1 C)  SpO2: 100%   Wt Readings from Last 3 Encounters:  04/23/19 219 lb 1.6 oz (99.4 kg)  04/02/19 219 lb 11.2 oz (99.7 kg)  03/11/19 218 lb 11.2 oz (99.2 kg)   Body mass index is 37.61 kg/m.    ECOG FS:1 - Symptomatic but completely ambulatory GENERAL: Patient is a well appearing female in no acute distress HEENT:  Sclerae anicteric.  Mask in place. Neck is supple.  NODES:  No cervical, supraclavicular, or axillary lymphadenopathy palpated.  BREAST EXAM:  Right breast s/p mastectomy, no sign of local recurrence, left breast benign. LUNGS:  Clear to auscultation bilaterally.  No wheezes or rhonchi. HEART:  Regular rate and rhythm. No murmur appreciated. ABDOMEN:  Soft, nontender.  Positive, normoactive bowel sounds. No organomegaly palpated. MSK:  No focal spinal tenderness to palpation. Full range of motion bilaterally in the upper extremities. EXTREMITIES:  No peripheral edema.   SKIN:  Clear with no obvious rashes or skin changes. No nail dyscrasia. NEURO:  Nonfocal. Well oriented.  Appropriate affect.    LAB RESULTS:  CMP     Component Value Date/Time   NA 145 04/23/2019 1220   NA 142 10/13/2016 1045   K 4.0 04/23/2019 1220   CL 105  04/23/2019 1220   CO2 28 04/23/2019 1220   GLUCOSE 91 04/23/2019 1220   BUN 9 04/23/2019 1220   BUN 8 10/13/2016 1045   CREATININE 0.89 04/23/2019 1220   CREATININE 0.90 12/19/2018 1434   CALCIUM 8.4 (L) 04/23/2019 1220   PROT 6.3 (L) 04/23/2019 1220   PROT 6.3 10/13/2016 1045   ALBUMIN 3.4 (L) 04/23/2019 1220  ALBUMIN 3.9 10/13/2016 1045   AST 19 04/23/2019 1220   AST 14 (L) 08/14/2018 0758   ALT 11 04/23/2019 1220   ALT 10 08/14/2018 0758   ALKPHOS 104 04/23/2019 1220   BILITOT 0.3 04/23/2019 1220   BILITOT 0.5 08/14/2018 0758   GFRNONAA >60 04/23/2019 1220   GFRNONAA >60 12/19/2018 1434   GFRAA >60 04/23/2019 1220   GFRAA >60 12/19/2018 1434    No results found for: TOTALPROTELP, ALBUMINELP, A1GS, A2GS, BETS, BETA2SER, GAMS, MSPIKE, SPEI  No results found for: KPAFRELGTCHN, LAMBDASER, KAPLAMBRATIO  Lab Results  Component Value Date   WBC 5.5 04/23/2019   NEUTROABS 3.2 04/23/2019   HGB 10.6 (L) 04/23/2019   HCT 33.1 (L) 04/23/2019   MCV 93.5 04/23/2019   PLT 254 04/23/2019    _0 @  No results found for: LABCA2  No components found for: YQMVHQ469  No results for input(s): INR in the last 168 hours.  No results found for: LABCA2  No results found for: GEX528  No results found for: UXL244  No results found for: WNU272  No results found for: CA2729  No components found for: HGQUANT  No results found for: CEA1 / No results found for: CEA1   No results found for: AFPTUMOR  No results found for: CHROMOGRNA  No results found for: PSA1  Appointment on 04/23/2019  Component Date Value Ref Range Status  . WBC 04/23/2019 5.5  4.0 - 10.5 K/uL Final  . RBC 04/23/2019 3.54* 3.87 - 5.11 MIL/uL Final  . Hemoglobin 04/23/2019 10.6* 12.0 - 15.0 g/dL Final  . HCT 04/23/2019 33.1* 36.0 - 46.0 % Final  . MCV 04/23/2019 93.5  80.0 - 100.0 fL Final  . MCH 04/23/2019 29.9  26.0 - 34.0 pg Final  . MCHC 04/23/2019 32.0  30.0 - 36.0 g/dL Final  . RDW  04/23/2019 14.3  11.5 - 15.5 % Final  . Platelets 04/23/2019 254  150 - 400 K/uL Final  . nRBC 04/23/2019 0.5* 0.0 - 0.2 % Final  . Neutrophils Relative % 04/23/2019 57  % Final  . Neutro Abs 04/23/2019 3.2  1.7 - 7.7 K/uL Final  . Lymphocytes Relative 04/23/2019 11  % Final  . Lymphs Abs 04/23/2019 0.6* 0.7 - 4.0 K/uL Final  . Monocytes Relative 04/23/2019 22  % Final  . Monocytes Absolute 04/23/2019 1.2* 0.1 - 1.0 K/uL Final  . Eosinophils Relative 04/23/2019 2  % Final  . Eosinophils Absolute 04/23/2019 0.1  0.0 - 0.5 K/uL Final  . Basophils Relative 04/23/2019 1  % Final  . Basophils Absolute 04/23/2019 0.1  0.0 - 0.1 K/uL Final  . Smear Review 04/23/2019 META AND MYELO SEEN   Final  . Immature Granulocytes 04/23/2019 7  % Final  . Abs Immature Granulocytes 04/23/2019 0.36* 0.00 - 0.07 K/uL Final   Performed at [facility], [place] [address] [place],  [address]  . Sodium 04/23/2019 145  135 - 145 mmol/L Final  . Potassium 04/23/2019 4.0  3.5 - 5.1 mmol/L Final  . Chloride 04/23/2019 105  98 - 111 mmol/L Final  . CO2 04/23/2019 28  22 - 32 mmol/L Final  . Glucose, Bld 04/23/2019 91  70 - 99 mg/dL Final  . BUN 04/23/2019 9  8 - 23 mg/dL Final  . Creatinine, Ser 04/23/2019 0.89  0.44 - 1.00 mg/dL Final  . Calcium 04/23/2019 8.4* 8.9 - 10.3 mg/dL Final  . Total Protein 04/23/2019 6.3* 6.5 - 8.1 g/dL Final  .  Albumin 04/23/2019 3.4* 3.5 - 5.0 g/dL Final  . AST 04/23/2019 19  15 - 41 U/L Final  . ALT 04/23/2019 11  0 - 44 U/L Final  . Alkaline Phosphatase 04/23/2019 104  38 - 126 U/L Final  . Total Bilirubin 04/23/2019 0.3  0.3 - 1.2 mg/dL Final  . GFR calc non Af Amer 04/23/2019 >60  >60 mL/min Final  . GFR calc Af Amer 04/23/2019 >60  >60 mL/min Final  . Anion gap 04/23/2019 12  5 - 15 Final   Performed at [facility], [address] [place], [place] [address]    (this displays the last labs from the last 3 days)  No  results found for: TOTALPROTELP, ALBUMINELP, A1GS, A2GS, BETS, BETA2SER, GAMS, MSPIKE, SPEI (this displays SPEP labs)  No results found for: KPAFRELGTCHN, LAMBDASER, KAPLAMBRATIO (kappa/lambda light chains)  No results found for: HGBA, HGBA2QUANT, HGBFQUANT, HGBSQUAN (Hemoglobinopathy evaluation)   No results found for: LDH  No results found for: IRON, TIBC, IRONPCTSAT (Iron and TIBC)  No results found for: FERRITIN  Urinalysis    Component Value Date/Time   COLORURINE YELLOW 12/19/2018 1355   APPEARANCEUR HAZY (A) 12/19/2018 1355   APPEARANCEUR Clear 05/03/2018 1204   LABSPEC 1.016 12/19/2018 1355   PHURINE 5.0 12/19/2018 [place] 12/19/2018 [place] 12/19/2018 [place] 12/19/2018 1355   BILIRUBINUR Negative 05/03/2018 1204   KETONESUR NEGATIVE 12/19/2018 1355   PROTEINUR NEGATIVE 12/19/2018 1355   UROBILINOGEN negative 11/03/2014 1039   UROBILINOGEN 0.2 05/25/2011 0923   NITRITE NEGATIVE 12/19/2018 1355   LEUKOCYTESUR SMALL (A) 12/19/2018 1355     STUDIES: No results found.  ELIGIBLE FOR AVAILABLE RESEARCH PROTOCOL: no   ASSESSMENT: 68 y.o. [place], [place] woman status post right breast biopsy 08/02/2018 for a clinical T3 N0, stage IIA invasive lobular carcinoma, grade 2, estrogen receptor strongly positive, progesterone receptor 1% positive, with no HER-2 amplification and an MIB-1 of 1%.  (a) CT scan of the head and chest, without contrast 08/23/2018 showed nonspecific 0.4 cm left lower lobe pulmonary nodule, no definitive metastatic disease  (b) bone scan 08/23/2018 shows multiple spinal areas of abnormal uptake, but  (c) total spinal MRI 09/03/2018 finds bone scan findings to be secondary to degenerative disease, no evidence of metastatic disease.  (1) status post right mastectomy and sentinel lymph node sampling 09/16/2018 showing a pT3 pN1(mic), stage IIA-IIIA invasive lobular breast cancer, grade 2, with 1 of 5  sampled lymph nodes involved by micrometastatic deposit, with extra nodular extension; ample margins  (2)  MammaPrint "high risk" suggests a 5-year metastasis free survival of 93% with chemotherapy, with an absolute chemotherapy benefit in the greater than 12% range  (3) adjuvant radiation 12/31/2018-02/19/2019:  The right chest wall and regional nodes were treated to 50.4 Gy in 28 fractions followed by a 10 Gy boost over 5 fractions.   (4) anastrozole started neoadjuvantly (on 08/14/2018), discontinued 10/14/2018 in preparation for chemotherapy, resumed October 2020  (a) bone density 10/13/2016 showed osteopenia with a lowest score at -2.1  (5) [facility] testing on mastectomy sample (09/16/2018) showed stable MSI and proficient mismatch repair status, with a low mutational burden; BRCA 1 and 2 were not mutated, PI K3 was not mutated, ER B B2 was not mutated, and a KT 1 was not mutated.  The androgen receptor was positive (90% at 2+) and there was a pathogenic PTEN variant an excellent 3 (c.209+1G>A)  (6) adjuvant chemotherapy  consisting of cyclophosphamide, methotrexate, fluorouracil (CMF) started 11/05/2018, repeated every 21 days x 8, last dose 03/02/2019  (a) methotrexate was omitted during concurrent radiation (cycles 4, 5 and 6)   PLAN: Doris Smith is doing well today, and is improving and slowly regaining her strength since completing chemotherapy.  She has no signs of breast cancer recurrence today.  I commended her for working hard to increase her activity level.    We discussed the Anastrozole and I recommended that she restart this.  She was due to start Prolia today, however, it has not been approved by insurance.  She will instead receive this at her next appt with [place].  Cindy and I reviewed the Anastrozole again in detail, and that she should take it daily.  I informed her that should her fatigue/DOE worsen, to let us or her PCP know so that more labs may be tested, evaluated.  Doris Smith will  return in 3 months for labs, f/u with Dr. Jana Hakim, and her Prolia injection.  I also talked to her about in 6 months an SCP appointment to review her journey, the details, and what to expect moving forward.  She is in agreement with this.  She was recommended to continue with the appropriate pandemic precautions. She knows to call for any questions that may arise between now and her next appointment.  We are happy to see her sooner if needed.  A total of (30) minutes of face-to-face time was spent with this patient with greater than 50% of that time in counseling and care-coordination.   Wilber Bihari, NP  04/24/19 12:07 PM Medical Oncology and Hematology [facility] [place], [place] [address] Tel. [phone]    Fax. [phone]

## 2019-04-24 ENCOUNTER — Telehealth: Payer: Self-pay | Admitting: Adult Health

## 2019-04-24 NOTE — Telephone Encounter (Signed)
I talk with patient regarding schedule  

## 2019-05-06 DIAGNOSIS — R05 Cough: Secondary | ICD-10-CM | POA: Diagnosis not present

## 2019-05-06 DIAGNOSIS — J309 Allergic rhinitis, unspecified: Secondary | ICD-10-CM | POA: Diagnosis not present

## 2019-05-06 DIAGNOSIS — K449 Diaphragmatic hernia without obstruction or gangrene: Secondary | ICD-10-CM | POA: Diagnosis not present

## 2019-05-09 ENCOUNTER — Other Ambulatory Visit: Payer: Self-pay | Admitting: Gastroenterology

## 2019-05-13 DIAGNOSIS — F410 Panic disorder [episodic paroxysmal anxiety] without agoraphobia: Secondary | ICD-10-CM | POA: Diagnosis not present

## 2019-05-13 DIAGNOSIS — F4310 Post-traumatic stress disorder, unspecified: Secondary | ICD-10-CM | POA: Diagnosis not present

## 2019-05-13 DIAGNOSIS — F312 Bipolar disorder, current episode manic severe with psychotic features: Secondary | ICD-10-CM | POA: Diagnosis not present

## 2019-05-15 ENCOUNTER — Ambulatory Visit (INDEPENDENT_AMBULATORY_CARE_PROVIDER_SITE_OTHER): Payer: BC Managed Care – PPO | Admitting: Nurse Practitioner

## 2019-05-15 ENCOUNTER — Encounter: Payer: Self-pay | Admitting: Nurse Practitioner

## 2019-05-15 ENCOUNTER — Other Ambulatory Visit: Payer: Self-pay

## 2019-05-15 VITALS — BP 115/64 | HR 68 | Temp 97.5°F | Resp 20 | Ht 64.0 in | Wt 217.0 lb

## 2019-05-15 DIAGNOSIS — F5102 Adjustment insomnia: Secondary | ICD-10-CM

## 2019-05-15 DIAGNOSIS — I700 Atherosclerosis of aorta: Secondary | ICD-10-CM

## 2019-05-15 DIAGNOSIS — I4891 Unspecified atrial fibrillation: Secondary | ICD-10-CM

## 2019-05-15 DIAGNOSIS — C50211 Malignant neoplasm of upper-inner quadrant of right female breast: Secondary | ICD-10-CM

## 2019-05-15 DIAGNOSIS — K581 Irritable bowel syndrome with constipation: Secondary | ICD-10-CM

## 2019-05-15 DIAGNOSIS — K219 Gastro-esophageal reflux disease without esophagitis: Secondary | ICD-10-CM | POA: Diagnosis not present

## 2019-05-15 DIAGNOSIS — E669 Obesity, unspecified: Secondary | ICD-10-CM | POA: Diagnosis not present

## 2019-05-15 DIAGNOSIS — M5137 Other intervertebral disc degeneration, lumbosacral region: Secondary | ICD-10-CM

## 2019-05-15 DIAGNOSIS — F3175 Bipolar disorder, in partial remission, most recent episode depressed: Secondary | ICD-10-CM | POA: Diagnosis not present

## 2019-05-15 DIAGNOSIS — I10 Essential (primary) hypertension: Secondary | ICD-10-CM | POA: Diagnosis not present

## 2019-05-15 MED ORDER — ESOMEPRAZOLE MAGNESIUM 40 MG PO CPDR: 40.0000 mg | DELAYED_RELEASE_CAPSULE | Freq: Every day | ORAL | 1 refills | Status: DC

## 2019-05-15 MED ORDER — GABAPENTIN 300 MG PO CAPS: 300.0000 mg | ORAL_CAPSULE | Freq: Two times a day (BID) | ORAL | 2 refills | Status: DC

## 2019-05-15 NOTE — Addendum Note (Signed)
Addended by: Chevis Pretty on: 05/15/2019 12:48 PM   Modules accepted: Orders

## 2019-05-15 NOTE — Progress Notes (Signed)
Subjective:    Patient ID: Doris Smith, female    DOB: December 17, 1950, 68 y.o.   MRN: [id number]   Chief Complaint: Medical Management of Chronic Issues    HPI:  1. Essential hypertension No c/o chest pain, sob or headache. Does not check blood pressure at home. BP Readings from Last 3 Encounters:  05/15/19 115/64  04/23/19 131/68  04/02/19 (!) 119/50     2. Aortic atherosclerosis ([place]) Has not seen cardiology in over a year. Says she has not had any hert issues  3. Atrial fibrillation, unspecified type ([place]) Is not on any blood thinners. deneis any palpitations  4. Gastroesophageal reflux disease without esophagitis Takes nexium daily. She has hiatal hernia that she is going to have repaired.  5. Irritable bowel syndrome with constipation no recent flare ups  6. Bipolar disorder, in partial remission, most recent episode depressed ([place]) Is on viibryd and lamictal and she says she is doing well. Sees Psych. They added a new med but she does not know the name of it. She will call back and let us know what it is.  Depression screen [facility] 2/9 05/15/2019 12/25/2018 07/11/2018  Decreased Interest 0 0 3  Down, Depressed, Hopeless 0 0 3  PHQ - 2 Score 0 0 6  Altered sleeping - - 3  Tired, decreased energy - - 3  Change in appetite - - 3  Feeling bad or failure about yourself  - - 3  Trouble concentrating - - 3  Moving slowly or fidgety/restless - - 2  Suicidal thoughts - - 0  PHQ-9 Score - - 23  Difficult doing work/chores - - -  Some recent data might be hidden   7. GAD Takes ativan 3x a day and stays anxious. GAD 7 : Generalized Anxiety Score 05/15/2019 12/10/2015  Nervous, Anxious, on Edge 3 3  Control/stop worrying 2 3  Worry too much - different things 2 3  Trouble relaxing 3 3  Restless 2 3  Easily annoyed or irritable 3 3  Afraid - awful might happen 0 0  Total GAD 7 Score 15 18  Anxiety Difficulty Somewhat difficult Not difficult at all     8. Insomnia due  to stress Not sleeping well. They do not want to put her on ambien due to taking ativan  9. Malignant neoplasm of upper-inner quadrant of right female breast, unspecified estrogen receptor status ([place]) Mastectomy, radiation and chemo. She is now on arimidex. She says she is doing ok.   10. Obesity (BMI 30-39.9) No recent weight changes Wt Readings from Last 3 Encounters:  05/15/19 217 lb (98.4 kg)  04/23/19 219 lb 1.6 oz (99.4 kg)  04/02/19 219 lb 11.2 oz (99.7 kg)   BMI Readings from Last 3 Encounters:  05/15/19 37.25 kg/m  04/23/19 37.61 kg/m  04/02/19 37.71 kg/m       Outpatient Encounter Medications as of 05/15/2019  Medication Sig  . anastrozole (ARIMIDEX) 1 MG tablet Take 1 tablet (1 mg total) by mouth daily.  . benzonatate (TESSALON PERLES) 100 MG capsule Take 1 capsule (100 mg total) by mouth 3 (three) times daily as needed for cough.  . clotrimazole-betamethasone (LOTRISONE) cream Apply 1 application topically 2 (two) times daily. (Patient taking differently: Apply 1 application topically 2 (two) times daily as needed (rash). )  . esomeprazole (NEXIUM) 40 MG capsule TAKE 1 CAPSULE BY MOUTH ONCE DAILY BEFORE BREAKFAST  . gabapentin (NEURONTIN) 300 MG capsule Take 1 capsule (300 mg total)  by mouth 2 (two) times daily.  Marland Kitchen lamoTRIgine (LAMICTAL) 100 MG tablet Take 100 mg by mouth 2 (two) times daily.   Marland Kitchen LORazepam (ATIVAN) 0.5 MG tablet Take 1 tablet (0.5 mg total) by mouth at bedtime as needed (Nausea or vomiting).  . promethazine-dextromethorphan (PROMETHAZINE-DM) 6.25-15 MG/5ML syrup TAKE 5 MILLILITERS BY MOUTH 4 TIMES DAILY AS NEEDED FOR COUGH  . Vilazodone HCl (VIIBRYD) 40 MG TABS Take 40 mg by mouth daily.    Past Surgical History:  Procedure Laterality Date  . ABDOMINAL HYSTERECTOMY    . COLONOSCOPY    . DILATION AND CURETTAGE OF UTERUS  1981   abnormal pap  . KNEE ARTHROSCOPY Left 2007   x 2  . LUMBAR LAMINECTOMY/DECOMPRESSION MICRODISCECTOMY  05/31/2011    Procedure: LUMBAR LAMINECTOMY/DECOMPRESSION MICRODISCECTOMY;  Surgeon: Johnn Hai;  Location: [facility];  Service: Orthopedics;  Laterality: Left;  Decompression Lumbar four to five and  Lumbar five to Sacral one on Left  (X-Ray)  . MASTECTOMY W/ SENTINEL NODE BIOPSY Right 09/16/2018   Procedure: RIGHT MASTECTOMY WITH RIGHT AXILLARY SENTINEL LYMPH NODE BIOPSY;  Surgeon: Alphonsa Overall, MD;  Location: [place];  Service: General;  Laterality: Right;  . PARTIAL HYSTERECTOMY  1982  . PORTACATH PLACEMENT Left 10/31/2018   Procedure: INSERTION PORT-A-CATH WITH ULTRASOUND;  Surgeon: Alphonsa Overall, MD;  Location: [place];  Service: General;  Laterality: Left;  . TONSILLECTOMY     as child  . TOTAL KNEE ARTHROPLASTY Left 12/18/2005    Family History  Problem Relation Age of Onset  . Anesthesia problems Mother   . Heart disease Mother        CHF, atrial fib  . Heart failure Mother   . Anesthesia problems Sister   . Colon polyps Father   . Heart disease Father        "Fluid around the heart"  . Hypertension Sister   . Breast cancer Maternal Aunt   . Colon cancer Maternal Aunt   . Prostate cancer Neg Hx   . Ovarian cancer Neg Hx     New complaints: None today  Social history: Lives with her husband  Controlled substance contract: we do not provide her ativan she gets it form psych    Review of Systems  Constitutional: Negative for activity change and appetite change.  HENT: Negative.   Eyes: Negative for pain.  Respiratory: Negative for shortness of breath.   Cardiovascular: Negative for chest pain, palpitations and leg swelling.  Gastrointestinal: Negative for abdominal pain.  Endocrine: Negative for polydipsia.  Genitourinary: Negative.   Skin: Negative for rash.  Neurological: Negative for dizziness, weakness and headaches.  Hematological: Does not bruise/bleed easily.  Psychiatric/Behavioral: Negative.   All other systems reviewed and are negative.       Objective:   Physical Exam Vitals signs and nursing note reviewed.  Constitutional:      General: She is not in acute distress.    Appearance: Normal appearance. She is well-developed.  HENT:     Head: Normocephalic.     Nose: Nose normal.  Eyes:     Pupils: Pupils are equal, round, and reactive to light.  Neck:     Musculoskeletal: Normal range of motion and neck supple.     Vascular: No carotid bruit or JVD.  Cardiovascular:     Rate and Rhythm: Normal rate and regular rhythm.     Heart sounds: Normal heart sounds.  Pulmonary:     Effort: Pulmonary effort is normal.  No respiratory distress.     Breath sounds: Normal breath sounds. No wheezing or rales.  Chest:     Chest wall: No tenderness.  Abdominal:     General: Bowel sounds are normal. There is no distension or abdominal bruit.     Palpations: Abdomen is soft. There is no hepatomegaly, splenomegaly, mass or pulsatile mass.     Tenderness: There is no abdominal tenderness.  Musculoskeletal: Normal range of motion.  Lymphadenopathy:     Cervical: No cervical adenopathy.  Skin:    General: Skin is warm and dry.  Neurological:     Mental Status: She is alert and oriented to person, place, and time.     Deep Tendon Reflexes: Reflexes are normal and symmetric.  Psychiatric:        Behavior: Behavior normal.        Thought Content: Thought content normal.        Judgment: Judgment normal.     BP 115/64   Pulse 68   Temp (!) 97.5 F (36.4 C) (Temporal)   Resp 20   Ht 5\' 4"  (1.626 m)   Wt 217 lb (98.4 kg)   SpO2 100%   BMI 37.25 kg/m        Assessment & Plan:  Shantise Beauchesne Kuiken comes in today with chief complaint of Medical Management of Chronic Issues   Diagnosis and orders addressed:  1. Essential hypertension Low sodium diet  2. Aortic atherosclerosis ([place])  3. Atrial fibrillation, unspecified type ([facility]) Avoid caffeine  4. Gastroesophageal reflux disease without esophagitis Avoid spicy foods Do  not eat 2 hours prior to bedtime - esomeprazole (NEXIUM) 40 MG capsule; Take 1 capsule (40 mg total) by mouth daily.  Dispense: 90 capsule; Refill: 1  5. Irritable bowel syndrome with constipation Watch diet and avoid foods that cause flare up  6. Bipolar disorder, in partial remission, most recent episode depressed ([place]) stress management Continue to work with pysch on medications  7. Insomnia due to stress Bedtime routine  8. Malignant neoplasm of upper-inner quadrant of right female breast, unspecified estrogen receptor status ([place]) Is planning on having porta cath removed in december  9. Obesity (BMI 30-39.9) Discussed diet and exercise for person with BMI >25 Will recheck weight in 3-6 months   10. DEGENERATIVE DISC DISEASE, LUMBOSACRAL SPINE Back exeercises - gabapentin (NEURONTIN) 300 MG capsule; Take 1 capsule (300 mg total) by mouth 2 (two) times daily.  Dispense: 60 capsule; Refill: 2   Labs pending Health Maintenance reviewed Diet and exercise encouraged  Follow up plan: 6 months   [name] Done, FNP

## 2019-05-15 NOTE — Patient Instructions (Signed)
Stress Stress is a normal reaction to life events. Stress is what you feel when life demands more than you are used to, or more than you think you can handle. Some stress can be useful, such as studying for a test or meeting a deadline at work. Stress that occurs too often or for too long can cause problems. It can affect your emotional health and interfere with relationships and normal daily activities. Too much stress can weaken your body's defense system (immune system) and increase your risk for physical illness. If you already have a medical problem, stress can make it worse. What are the causes? All sorts of life events can cause stress. An event that causes stress for one person may not be stressful for another person. Major life events, whether positive or negative, commonly cause stress. Examples include:  Losing a job or starting a new job.  Losing a loved one.  Moving to a new town or home.  Getting married or divorced.  Having a baby.  Injury or illness. Less obvious life events can also cause stress, especially if they occur day after day or in combination with each other. Examples include:  Working long hours.  Driving in traffic.  Caring for children.  Being in debt.  Being in a difficult relationship. What are the signs or symptoms? Stress can cause emotional symptoms, including:  Anxiety. This is feeling worried, afraid, on edge, overwhelmed, or out of control.  Anger, including irritation or impatience.  Depression. This is feeling sad, down, helpless, or guilty.  Trouble focusing, remembering, or making decisions. Stress can cause physical symptoms, including:  Aches and pains. These may affect your head, neck, back, stomach, or other areas of your body.  Tight muscles or a clenched jaw.  Low energy.  Trouble sleeping. Stress can cause unhealthy behaviors, including:  Eating to feel better (overeating) or skipping meals.  Working too much or  putting off tasks.  Smoking, drinking alcohol, or using drugs to feel better. How is this diagnosed? Stress is diagnosed through an assessment by your health care provider. He or she may diagnose this condition based on:  Your symptoms and any stressful life events.  Your medical history.  Tests to rule out other causes of your symptoms. Depending on your condition, your health care provider may refer you to a specialist for further evaluation. How is this treated?  Stress management techniques are the recommended treatment for stress. Medicine is not typically recommended for the treatment of stress. Techniques to reduce your reaction to stressful life events include:  Stress identification. Monitor yourself for symptoms of stress and identify what causes stress for you. These skills may help you to avoid or prepare for stressful events.  Time management. Set your priorities, keep a calendar of events, and learn to say "no." Taking these actions can help you avoid making too many commitments. Techniques for coping with stress include:  Rethinking the problem. Try to think realistically about stressful events rather than ignoring them or overreacting. Try to find the positives in a stressful situation rather than focusing on the negatives.  Exercise. Physical exercise can release both physical and emotional tension. The key is to find a form of exercise that you enjoy and do it regularly.  Relaxation techniques. These relax the body and mind. The key is to find one or more that you enjoy and use the technique(s) regularly. Examples include: ? Meditation, deep breathing, or progressive relaxation techniques. ? Yoga or tai chi. ?  Biofeedback, mindfulness techniques, or journaling. ? Listening to music, being out in nature, or participating in other hobbies.  Practicing a healthy lifestyle. Eat a balanced diet, drink plenty of water, limit or avoid caffeine, and get plenty of sleep.   Having a strong support network. Spend time with family, friends, or other people you enjoy being around. Express your feelings and talk things over with someone you trust. Counseling or talk therapy with a mental health professional may be helpful if you are having trouble managing stress on your own. Follow these instructions at home: Lifestyle   Avoid drugs.  Do not use any products that contain nicotine or tobacco, such as cigarettes and e-cigarettes. If you need help quitting, ask your health care provider.  Limit alcohol intake to no more than 1 drink a day for nonpregnant women and 2 drinks a day for men. One drink equals 12 oz of beer, 5 oz of wine, or 1 oz of hard liquor.  Do not use alcohol or drugs to relax.  Eat a balanced diet that includes fresh fruits and vegetables, whole grains, lean meats, fish, eggs, and beans, and low-fat dairy. Avoid processed foods and foods high in added fat, sugar, and salt.  Exercise at least 30 minutes on 5 or more days each week.  Get 7-8 hours of sleep each night. General instructions   Practice stress management techniques as discussed with your health care provider.  Drink enough fluid to keep your urine clear or pale yellow.  Take over-the-counter and prescription medicines only as told by your health care provider.  Keep all follow-up visits as told by your health care provider. This is important. Contact a health care provider if:  Your symptoms get worse.  You have new symptoms.  You feel overwhelmed by your problems and can no longer manage them on your own. Get help right away if:  You have thoughts of hurting yourself or others. If you ever feel like you may hurt yourself or others, or have thoughts about taking your own life, get help right away. You can go to your nearest emergency department or call:  Your local emergency services (911 in the [place]  A suicide crisis helpline, such as the [facility] at 361-710-0508. This is open 24 hours a day. Summary  Stress is a normal reaction to life events. It can cause problems if it happens too often or for too long.  Practicing stress management techniques is the best way to treat stress.  Counseling or talk therapy with a mental health professional may be helpful if you are having trouble managing stress on your own. This information is not intended to replace advice given to you by your health care provider. Make sure you discuss any questions you have with your health care provider. Document Released: 12/20/2000 Document Revised: 06/08/2017 Document Reviewed: 08/16/2016 Elsevier Patient Education  2020 [employer].

## 2019-05-16 LAB — CMP14+EGFR
ALT: 9 IU/L (ref 0–32)
AST: 16 IU/L (ref 0–40)
Albumin/Globulin Ratio: 1.6 (ref 1.2–2.2)
Albumin: 3.9 g/dL (ref 3.8–4.8)
Alkaline Phosphatase: 110 IU/L (ref 39–117)
BUN/Creatinine Ratio: 15 (ref 12–28)
BUN: 11 mg/dL (ref 8–27)
Bilirubin Total: 0.3 mg/dL (ref 0.0–1.2)
CO2: 26 mmol/L (ref 20–29)
Calcium: 9 mg/dL (ref 8.7–10.3)
Chloride: 103 mmol/L (ref 96–106)
Creatinine, Ser: 0.73 mg/dL (ref 0.57–1.00)
GFR calc Af Amer: 99 mL/min/{1.73_m2} (ref >59)
GFR calc non Af Amer: 86 mL/min/{1.73_m2} (ref >59)
Globulin, Total: 2.4 g/dL (ref 1.5–4.5)
Glucose: 89 mg/dL (ref 65–99)
Potassium: 3.9 mmol/L (ref 3.5–5.2)
Sodium: 141 mmol/L (ref 134–144)
Total Protein: 6.3 g/dL (ref 6.0–8.5)

## 2019-05-16 LAB — LIPID PANEL
Chol/HDL Ratio: 2.3 ratio (ref 0.0–4.4)
Cholesterol, Total: 177 mg/dL (ref 100–199)
HDL: 76 mg/dL (ref >39)
LDL Chol Calc (NIH): 81 mg/dL (ref 0–99)
Triglycerides: 116 mg/dL (ref 0–149)
VLDL Cholesterol Cal: 20 mg/dL (ref 5–40)

## 2019-06-10 DIAGNOSIS — F410 Panic disorder [episodic paroxysmal anxiety] without agoraphobia: Secondary | ICD-10-CM | POA: Diagnosis not present

## 2019-06-10 DIAGNOSIS — F312 Bipolar disorder, current episode manic severe with psychotic features: Secondary | ICD-10-CM | POA: Diagnosis not present

## 2019-06-10 DIAGNOSIS — F4310 Post-traumatic stress disorder, unspecified: Secondary | ICD-10-CM | POA: Diagnosis not present

## 2019-06-17 ENCOUNTER — Encounter: Payer: Self-pay | Admitting: *Deleted

## 2019-06-19 DIAGNOSIS — C50911 Malignant neoplasm of unspecified site of right female breast: Secondary | ICD-10-CM | POA: Diagnosis not present

## 2019-07-01 ENCOUNTER — Encounter: Payer: Self-pay | Admitting: *Deleted

## 2019-07-15 DIAGNOSIS — F312 Bipolar disorder, current episode manic severe with psychotic features: Secondary | ICD-10-CM | POA: Diagnosis not present

## 2019-07-15 DIAGNOSIS — R002 Palpitations: Secondary | ICD-10-CM | POA: Insufficient documentation

## 2019-07-15 DIAGNOSIS — F410 Panic disorder [episodic paroxysmal anxiety] without agoraphobia: Secondary | ICD-10-CM | POA: Diagnosis not present

## 2019-07-15 DIAGNOSIS — F4310 Post-traumatic stress disorder, unspecified: Secondary | ICD-10-CM | POA: Diagnosis not present

## 2019-07-15 DIAGNOSIS — Z7189 Other specified counseling: Secondary | ICD-10-CM | POA: Insufficient documentation

## 2019-07-15 NOTE — Progress Notes (Signed)
Error

## 2019-07-17 ENCOUNTER — Ambulatory Visit (INDEPENDENT_AMBULATORY_CARE_PROVIDER_SITE_OTHER): Payer: BC Managed Care – PPO | Admitting: Cardiology

## 2019-07-17 DIAGNOSIS — Z7189 Other specified counseling: Secondary | ICD-10-CM

## 2019-07-17 DIAGNOSIS — R002 Palpitations: Secondary | ICD-10-CM

## 2019-07-22 NOTE — Progress Notes (Signed)
[place]  [phone] [phone]     ID: Tasnim Balentine Covalt DOB: [birth year]  MR#: [id number]  YTK#:[id number]  Patient Care Team: Chevis Pretty, FNP as PCP - General (Nurse Practitioner) Alphonsa Overall, MD as Consulting Physician (General Surgery) Maejor Erven, Virgie Dad, MD as Consulting Physician (Oncology) Kyung Rudd, MD as Consulting Physician (Radiation Oncology) Elijio Miles, MD as Consulting Physician (Pulmonary Disease) Netta Cedars, MD as Consulting Physician (Orthopedic Surgery) Susa Day, MD as Consulting Physician (Orthopedic Surgery) Janeth Rase, NP as Nurse Practitioner (Adult Health Nurse Practitioner) Mauro Kaufmann, RN as Oncology Nurse Navigator Rockwell Germany, RN as Oncology Nurse Navigator Irene Limbo, MD as Consulting Physician (Plastic Surgery) Chauncey Cruel, MD OTHER MD: Chapman Moss, psychiatry   CHIEF COMPLAINT: Estrogen receptor positive breast cancer (s/p right mastectomy)  CURRENT TREATMENT: anastrozole; ibandronate   INTERVAL HISTORY: Corlene returns today for follow-up and treatment of her estrogen receptor positive breast cancer.   She restarted on anastrozole at her last visit on 04/23/2019.  She is tolerating this generally well.  She has hot flashes which she does not consider very troublesome.  She was due to receive Prolia today.  However she tells me her insurance did not approve Prolia and she certainly cannot afford it out-of-pocket.  Her most recent bone density screening on 10/13/2016 showed a T-score of -2.1, which is considered osteopenic.  She will be due for left mammography later this month.   REVIEW OF SYSTEMS: Edmon Crape significant pain in the right chest wall area.  This is not unexpected.  She has soreness sensitivity and some shooting pains.  Less commonly she has pain in the shoulder and over the deltoid/biceps area.  This is triggered by abduction and can  be quite severe.  She is not aware of any trauma to the right arm.  There is no swelling or erythema.  Aside from these issues a detailed review of systems today was stable   HISTORY OF CURRENT ILLNESS: From the original intake note:  "Doris Smith" presented to her PCP, Dr. Hassell Done, with right breast pain, fullness, and nipple inversion since November 2019. She proceeded to undergo bilateral diagnostic mammography with tomography and right breast ultrasonography at [facility] on 07/31/2018 showing: findings compatible with multicentric breast cancer spanning at least the upper inner and upper outer quadrants of the right breast; normal right axilla. Palpable firmness of approximately 1.5 cm was noted in the 9 o'clock position, which was also seen on ultrasound with indisctinct margins measuring 2.7 x 2.3 x 2.2 cm. At 1 o'clock, a mass with poorly defined margins measures 1.9 x 1 x 0.8 cm. Additional poorly defined masses were seen in the 12 o'clock retroareolar region.  Accordingly on 08/02/2018 she proceeded to biopsy of the right breast area in question. The pathology ([id number]) from this procedure showed: invasive mammary carcinoma at 9 o'clock and 1 o'clock; e-cadherin negative, consistent with lobular phenotype; grade 2-3. Prognostic indicators significant for: estrogen receptor, 90% positive and progesterone receptor, 1% positive, both with strong staining intensity. Proliferation marker Ki67 at 1%. HER2 equivocal by immunohistochemistry, 2+ but negative by fluorescent in situ hybridization with a signals ratio 1.04 and number per cell 1.2.   The patient's subsequent history is as detailed below.   PAST MEDICAL HISTORY: Past Medical History:  Diagnosis Date  . A-fib ([place]) 11/2012   PAF  . Anxiety    takes Ativan  . Asthma 04/07/2011   dx  . Bipolar disorder ([place])   .  Cancer [facility])    Right breast  . Depression   . Dyspnea    DOE  . Early cataracts, bilateral   . Fatty liver   .  Fibromyalgia   . Fracture, ankle 10/2008   right and left  . GERD (gastroesophageal reflux disease)   . Irritable bowel syndrome   . Mental disorder    dx bipolar  . PONV (postoperative nausea and vomiting)   . Sleep apnea 04/2011    does not wear CPAP  . Wears glasses   She has chronic sinus headaches, hiatal hernia   PAST SURGICAL HISTORY: Past Surgical History:  Procedure Laterality Date  . ABDOMINAL HYSTERECTOMY    . COLONOSCOPY    . DILATION AND CURETTAGE OF UTERUS  1981   abnormal pap  . KNEE ARTHROSCOPY Left 2007   x 2  . LUMBAR LAMINECTOMY/DECOMPRESSION MICRODISCECTOMY  05/31/2011   Procedure: LUMBAR LAMINECTOMY/DECOMPRESSION MICRODISCECTOMY;  Surgeon: Johnn Hai;  Location: [facility];  Service: Orthopedics;  Laterality: Left;  Decompression Lumbar four to five and  Lumbar five to Sacral one on Left  (X-Ray)  . MASTECTOMY W/ SENTINEL NODE BIOPSY Right 09/16/2018   Procedure: RIGHT MASTECTOMY WITH RIGHT AXILLARY SENTINEL LYMPH NODE BIOPSY;  Surgeon: Alphonsa Overall, MD;  Location: [place];  Service: General;  Laterality: Right;  . PARTIAL HYSTERECTOMY  1982  . PORTACATH PLACEMENT Left 10/31/2018   Procedure: INSERTION PORT-A-CATH WITH ULTRASOUND;  Surgeon: Alphonsa Overall, MD;  Location: [place];  Service: General;  Laterality: Left;  . TONSILLECTOMY     as child  . TOTAL KNEE ARTHROPLASTY Left 12/18/2005    FAMILY HISTORY Family History  Problem Relation Age of Onset  . Anesthesia problems Mother   . Heart disease Mother        CHF, atrial fib  . Heart failure Mother   . Anesthesia problems Sister   . Colon polyps Father   . Heart disease Father        "Fluid around the heart"  . Hypertension Sister   . Breast cancer Maternal Aunt   . Colon cancer Maternal Aunt   . Prostate cancer Neg Hx   . Ovarian cancer Neg Hx    As of February 2019, patient father is alive at 104 years old. Patient mother is alive at 21 years old. She notes a family hx of  breast cancer. A maternal aunt was diagnosed with breast cancer, but Doris Smith is unsure if it was in one or both breasts. She has 3 siblings, 3 sisters and 0 brothers.   GYNECOLOGIC HISTORY:  No LMP recorded. Patient is postmenopausal. Menarche: 69 years old Age at first live birth: 69 years old [place] P 2 LMP about 42 years ago Contraceptive no HRT no  Hysterectomy? partial So? no   SOCIAL HISTORY: Doris Smith worked in [employer] for 15 years. She is on disability because her back, knees, and nerves. Her husband, [name], has been at [employer] 43 years as a Freight forwarder.  Even though their home is in [place] they are actually living in [place] in an apartment while her husband works there.  Son Legrand Como, age 63, works as a Chief Strategy Officer in [place], [place]. Son Shanon Brow, age 44, works as a Building control surveyor in [place], [place]. They have 4 grandchildren, 2 great-grandchildren with 2 more on the way. She attends a [employer].   ADVANCED DIRECTIVES: In the absence of any documents to the contrary the patient's husband is her healthcare power of attorney  HEALTH MAINTENANCE: Social History   Tobacco Use  . Smoking status: Never Smoker  . Smokeless tobacco: Never Used  Substance Use Topics  . Alcohol use: No  . Drug use: No     Colonoscopy: 2019  PAP: 11/03/2014, normal  Bone density: 10/13/2016, T-score of -2.1   Allergies  Allergen Reactions  . Iohexol     "over 20 years ago" had reaction "hurting in arm and heart"-Done in [place], [place].Marland Kitchenphysician stopped CT at that time.  . Codeine Other (See Comments)    Makes feel like she is wild   . Prednisone Other (See Comments)    Makes me very irritable  . Sulfonamide Derivatives Other (See Comments)    Upset stomach  . Celecoxib Nausea And Vomiting  . Sulfa Antibiotics Nausea And Vomiting  . Vitamin B12 Rash    Current Outpatient Medications  Medication Sig Dispense Refill  . anastrozole (ARIMIDEX) 1 MG tablet Take 1 tablet (1 mg total) by mouth daily.  90 tablet 3  . benzonatate (TESSALON PERLES) 100 MG capsule Take 1 capsule (100 mg total) by mouth 3 (three) times daily as needed for cough. 20 capsule 0  . clotrimazole-betamethasone (LOTRISONE) cream Apply 1 application topically 2 (two) times daily. (Patient taking differently: Apply 1 application topically 2 (two) times daily as needed (rash). ) 30 g 0  . esomeprazole (NEXIUM) 40 MG capsule Take 1 capsule (40 mg total) by mouth daily. 90 capsule 1  . gabapentin (NEURONTIN) 300 MG capsule Take 1 capsule (300 mg total) by mouth 2 (two) times daily. 60 capsule 2  . ibandronate (BONIVA) 150 MG tablet Take 1 tablet (150 mg total) by mouth every 30 (thirty) days. Take in the morning with a full glass of water, on an empty stomach, and do not take anything else by mouth or lie down for the next 30 min. 3 tablet 4  . lamoTRIgine (LAMICTAL) 100 MG tablet Take 100 mg by mouth 2 (two) times daily.     Marland Kitchen LORazepam (ATIVAN) 0.5 MG tablet Take 1 tablet (0.5 mg total) by mouth at bedtime as needed (Nausea or vomiting). 30 tablet 0  . promethazine-dextromethorphan (PROMETHAZINE-DM) 6.25-15 MG/5ML syrup TAKE 5 MILLILITERS BY MOUTH 4 TIMES DAILY AS NEEDED FOR COUGH    . Vilazodone HCl (VIIBRYD) 40 MG TABS Take 40 mg by mouth daily.     No current facility-administered medications for this visit.    OBJECTIVE: Middle-aged white woman in no acute distress  Vitals:   07/23/19 1244  BP: (!) 106/55  Pulse: 76  Resp: 18  Temp: 98.7 F (37.1 C)  SpO2: 99%   Wt Readings from Last 3 Encounters:  07/23/19 213 lb 11.2 oz (96.9 kg)  05/15/19 217 lb (98.4 kg)  04/23/19 219 lb 1.6 oz (99.4 kg)   Body mass index is 36.68 kg/m.    ECOG FS:1 - Symptomatic but completely ambulatory  Sclerae unicteric, EOMs intact Wearing a mask No cervical or supraclavicular adenopathy Lungs no rales or rhonchi Heart regular rate and rhythm Abd soft, nontender, positive bowel sounds MSK no focal spinal tenderness, no upper  extremity lymphedema but significant pain in the right shoulder area with minimal abduction Neuro: nonfocal, well oriented, appropriate affect Breasts: The right breast is status post mastectomy.  There is no evidence of chest wall recurrence.  There is still some hyperpigmentation.  That entire area is remains very sensitive to touch.   LAB RESULTS:  CMP     Component Value Date/Time  NA 142 07/23/2019 1155   NA 141 05/15/2019 1252   K 4.2 07/23/2019 1155   CL 105 07/23/2019 1155   CO2 26 07/23/2019 1155   GLUCOSE 86 07/23/2019 1155   BUN 15 07/23/2019 1155   BUN 11 05/15/2019 1252   CREATININE 0.88 07/23/2019 1155   CREATININE 0.90 12/19/2018 1434   CALCIUM 8.6 (L) 07/23/2019 1155   PROT 6.8 07/23/2019 1155   PROT 6.3 05/15/2019 1252   ALBUMIN 3.7 07/23/2019 1155   ALBUMIN 3.9 05/15/2019 1252   AST 14 (L) 07/23/2019 1155   AST 14 (L) 08/14/2018 0758   ALT 9 07/23/2019 1155   ALT 10 08/14/2018 0758   ALKPHOS 106 07/23/2019 1155   BILITOT 0.4 07/23/2019 1155   BILITOT 0.3 05/15/2019 1252   BILITOT 0.5 08/14/2018 0758   GFRNONAA >60 07/23/2019 1155   GFRNONAA >60 12/19/2018 1434   GFRAA >60 07/23/2019 1155   GFRAA >60 12/19/2018 1434    No results found for: TOTALPROTELP, ALBUMINELP, A1GS, A2GS, BETS, BETA2SER, GAMS, MSPIKE, SPEI  No results found for: KPAFRELGTCHN, LAMBDASER, KAPLAMBRATIO  Lab Results  Component Value Date   WBC 4.8 07/23/2019   NEUTROABS 3.3 07/23/2019   HGB 11.8 (L) 07/23/2019   HCT 36.7 07/23/2019   MCV 88.4 07/23/2019   PLT 249 07/23/2019   No results found for: LABCA2  No components found for: [id number]  No results for input(s): INR in the last 168 hours.  No results found for: LABCA2  No results found for: WPY099  No results found for: IPJ825  No results found for: KNL976  No results found for: CA2729  No components found for: HGQUANT  No results found for: CEA1 / No results found for: CEA1   No results found for:  AFPTUMOR  No results found for: CHROMOGRNA  No results found for: HGBA, HGBA2QUANT, HGBFQUANT, HGBSQUAN (Hemoglobinopathy evaluation)   No results found for: LDH  No results found for: IRON, TIBC, IRONPCTSAT (Iron and TIBC)  No results found for: FERRITIN  Urinalysis    Component Value Date/Time   COLORURINE YELLOW 12/19/2018 1355   APPEARANCEUR HAZY (A) 12/19/2018 1355   APPEARANCEUR Clear 05/03/2018 1204   LABSPEC 1.016 12/19/2018 1355   PHURINE 5.0 12/19/2018 [place] 12/19/2018 [place] 12/19/2018 [place] 12/19/2018 1355   BILIRUBINUR Negative 05/03/2018 1204   KETONESUR NEGATIVE 12/19/2018 1355   PROTEINUR NEGATIVE 12/19/2018 1355   UROBILINOGEN negative 11/03/2014 1039   UROBILINOGEN 0.2 05/25/2011 0923   NITRITE NEGATIVE 12/19/2018 1355   LEUKOCYTESUR SMALL (A) 12/19/2018 1355    STUDIES: No results found.   ELIGIBLE FOR AVAILABLE RESEARCH PROTOCOL: no   ASSESSMENT: 69 y.o. [place], [place] woman status post right breast biopsy 08/02/2018 for a clinical T3 N0, stage IIA invasive lobular carcinoma, grade 2, estrogen receptor strongly positive, progesterone receptor 1% positive, with no HER-2 amplification and an MIB-1 of 1%.  (a) CT scan of the head and chest, without contrast 08/23/2018 showed nonspecific 0.4 cm left lower lobe pulmonary nodule, no definitive metastatic disease  (b) bone scan 08/23/2018 shows multiple spinal areas of abnormal uptake, but  (c) total spinal MRI 09/03/2018 finds bone scan findings to be secondary to degenerative disease, no evidence of metastatic disease.  (1) status post right mastectomy and sentinel lymph node sampling 09/16/2018 showing a pT3 pN1(mic), stage IIA-IIIA invasive lobular breast cancer, grade 2, with 1 of 5 sampled lymph nodes involved by micrometastatic deposit, with extra  nodular extension; ample margins  (2)  MammaPrint "high risk" suggests a 5-year metastasis free  survival of 93% with chemotherapy, with an absolute chemotherapy benefit in the greater than 12% range  (3) adjuvant radiation 12/31/2018-02/19/2019:  The right chest wall and regional nodes were treated to 50.4 Gy in 28 fractions followed by a 10 Gy boost over 5 fractions.   (4) anastrozole started neoadjuvantly (on 08/14/2018), discontinued 10/14/2018 in preparation for chemotherapy, resumed October 2020  (a) bone density 10/13/2016 showed osteopenia with a lowest score at -2.1  (b) bone density JAN 2021  (5) [facility]is testing on mastectomy sample (09/16/2018) showed stable MSI and proficient mismatch repair status, with a low mutational burden; BRCA 1 and 2 were not mutated, PI K3 was not mutated, ER B B2 was not mutated, and a KT 1 was not mutated.  The androgen receptor was positive (90% at 2+) and there was a pathogenic PTEN variant an excellent 3 (c.209+1G>A)  (6) adjuvant chemotherapy consisting of cyclophosphamide, methotrexate, fluorouracil (CMF) started 11/05/2018, repeated every 21 days x 8, last dose 03/02/2019  (a) methotrexate was omitted during concurrent radiation (cycles 4, 5 and 6)   PLAN: Cindys will soon be a year out from definitive surgery for her breast cancer, with no evidence of disease recurrence.  This is very favorable.  She is tolerating anastrozole well and the plan is to continue that a minimum of 5 years.  The pain in the right chest wall area is expected although it is more than I usually encounter in patients like her.  She is on gabapentin twice daily and she does not think this is working.  I asked her to stop the gabapentin for 2 days and see whether the pain improves or gets worse.  If the pain gets worse then obviously the gabapentin was working and what she should do is increase the dose to 3 times daily.  The pain in the right shoulder with abduction seems to me to be not really related to this.  It may be a tear or some bursitis.  I am setting her up for an  MRI of the right shoulder and will refer her to orthopedics depending on results.  Apparently we could not get Prolia approved.  I am writing her for Boniva today.  I did explain how she should take this medication to avoid reflux issues and to optimize absorption  Otherwise she is already scheduled to see me in 3 months.  She knows to call for any other issue that may develop before the next visit  Total encounter time 35 minutes.Sarajane Jews C. Terease Marcotte, MD  07/23/19 1:35 PM Medical Oncology and Hematology [facility] [place], [place] [address] Tel. [phone]    Fax. [phone]   I, Wilburn Mylar, am acting as scribe for Dr. Virgie Dad. Kamaiyah Uselton.  I, Lurline Del MD, have reviewed the above documentation for accuracy and completeness, and I agree with the above.    *Total Encounter Time as defined by the Centers for Medicare and Medicaid Services includes, in addition to the face-to-face time of a patient visit (documented in the note above) non-face-to-face time: obtaining and reviewing outside history, ordering and reviewing medications, tests or procedures, care coordination (communications with other health care professionals or caregivers) and documentation in the medical record.

## 2019-07-23 ENCOUNTER — Other Ambulatory Visit: Payer: Self-pay

## 2019-07-23 ENCOUNTER — Inpatient Hospital Stay: Payer: BC Managed Care – PPO | Attending: Oncology

## 2019-07-23 ENCOUNTER — Inpatient Hospital Stay: Payer: BC Managed Care – PPO

## 2019-07-23 ENCOUNTER — Inpatient Hospital Stay (HOSPITAL_BASED_OUTPATIENT_CLINIC_OR_DEPARTMENT_OTHER): Payer: BC Managed Care – PPO | Admitting: Oncology

## 2019-07-23 VITALS — BP 106/55 | HR 76 | Temp 98.7°F | Resp 18 | Ht 64.0 in | Wt 213.7 lb

## 2019-07-23 DIAGNOSIS — Z79811 Long term (current) use of aromatase inhibitors: Secondary | ICD-10-CM | POA: Diagnosis not present

## 2019-07-23 DIAGNOSIS — F3177 Bipolar disorder, in partial remission, most recent episode mixed: Secondary | ICD-10-CM | POA: Diagnosis not present

## 2019-07-23 DIAGNOSIS — C50211 Malignant neoplasm of upper-inner quadrant of right female breast: Secondary | ICD-10-CM | POA: Insufficient documentation

## 2019-07-23 DIAGNOSIS — Z9011 Acquired absence of right breast and nipple: Secondary | ICD-10-CM | POA: Diagnosis not present

## 2019-07-23 DIAGNOSIS — Z170 Estrogen receptor positive status [ER+]: Secondary | ICD-10-CM

## 2019-07-23 DIAGNOSIS — C50411 Malignant neoplasm of upper-outer quadrant of right female breast: Secondary | ICD-10-CM

## 2019-07-23 LAB — CBC WITH DIFFERENTIAL/PLATELET
Abs Immature Granulocytes: 0.02 10*3/uL (ref 0.00–0.07)
Basophils Absolute: 0.1 10*3/uL (ref 0.0–0.1)
Basophils Relative: 1 %
Eosinophils Absolute: 0.1 10*3/uL (ref 0.0–0.5)
Eosinophils Relative: 3 %
HCT: 36.7 % (ref 36.0–46.0)
Hemoglobin: 11.8 g/dL — ABNORMAL LOW (ref 12.0–15.0)
Immature Granulocytes: 0 %
Lymphocytes Relative: 15 %
Lymphs Abs: 0.7 10*3/uL (ref 0.7–4.0)
MCH: 28.4 pg (ref 26.0–34.0)
MCHC: 32.2 g/dL (ref 30.0–36.0)
MCV: 88.4 fL (ref 80.0–100.0)
Monocytes Absolute: 0.6 10*3/uL (ref 0.1–1.0)
Monocytes Relative: 13 %
Neutro Abs: 3.3 10*3/uL (ref 1.7–7.7)
Neutrophils Relative %: 68 %
Platelets: 249 10*3/uL (ref 150–400)
RBC: 4.15 MIL/uL (ref 3.87–5.11)
RDW: 13.6 % (ref 11.5–15.5)
WBC: 4.8 10*3/uL (ref 4.0–10.5)
nRBC: 0 % (ref 0.0–0.2)

## 2019-07-23 LAB — COMPREHENSIVE METABOLIC PANEL
ALT: 9 U/L (ref 0–44)
AST: 14 U/L — ABNORMAL LOW (ref 15–41)
Albumin: 3.7 g/dL (ref 3.5–5.0)
Alkaline Phosphatase: 106 U/L (ref 38–126)
Anion gap: 11 (ref 5–15)
BUN: 15 mg/dL (ref 8–23)
CO2: 26 mmol/L (ref 22–32)
Calcium: 8.6 mg/dL — ABNORMAL LOW (ref 8.9–10.3)
Chloride: 105 mmol/L (ref 98–111)
Creatinine, Ser: 0.88 mg/dL (ref 0.44–1.00)
GFR calc Af Amer: >60 mL/min (ref >60)
GFR calc non Af Amer: >60 mL/min (ref >60)
Glucose, Bld: 86 mg/dL (ref 70–99)
Potassium: 4.2 mmol/L (ref 3.5–5.1)
Sodium: 142 mmol/L (ref 135–145)
Total Bilirubin: 0.4 mg/dL (ref 0.3–1.2)
Total Protein: 6.8 g/dL (ref 6.5–8.1)

## 2019-07-23 MED ORDER — IBANDRONATE SODIUM 150 MG PO TABS: 150.0000 mg | ORAL_TABLET | ORAL | 4 refills | Status: DC

## 2019-08-06 ENCOUNTER — Ambulatory Visit (HOSPITAL_COMMUNITY): Payer: BC Managed Care – PPO

## 2019-08-12 DIAGNOSIS — F4310 Post-traumatic stress disorder, unspecified: Secondary | ICD-10-CM | POA: Diagnosis not present

## 2019-08-12 DIAGNOSIS — F312 Bipolar disorder, current episode manic severe with psychotic features: Secondary | ICD-10-CM | POA: Diagnosis not present

## 2019-08-12 DIAGNOSIS — F410 Panic disorder [episodic paroxysmal anxiety] without agoraphobia: Secondary | ICD-10-CM | POA: Diagnosis not present

## 2019-08-29 ENCOUNTER — Telehealth: Payer: Self-pay | Admitting: Cardiology

## 2019-08-29 NOTE — Telephone Encounter (Signed)
New Message  Pt c/o Shortness Of Breath: STAT if SOB developed within the last 24 hours or pt is noticeably SOB on the phone  1. Are you currently SOB (can you hear that pt is SOB on the phone)? Yes  2. How long have you been experiencing SOB? 2 months, getting worse.  3. Are you SOB when sitting or when up moving around? Both  4. Are you currently experiencing any other symptoms? Increased HR (currently 99, was 138), SOB,

## 2019-08-29 NOTE — Telephone Encounter (Signed)
Spoke with patient who reports SOB for 2 months. She reports fluctuations in her HR. She reports she has had cancer w/chemo & radiation and is tearful saying she cannot have anything else wrong with her, but she is sure something it. She was a "no show" for a Jan 2021 visit. She was last seen 06/2016. Scheduled patient for MD OV on Feb 26 and advised ED eval for worsening SOB and/or chest pain symptoms.

## 2019-09-01 ENCOUNTER — Ambulatory Visit: Payer: BC Managed Care – PPO | Admitting: Nurse Practitioner

## 2019-09-04 DIAGNOSIS — W19XXXA Unspecified fall, initial encounter: Secondary | ICD-10-CM | POA: Insufficient documentation

## 2019-09-04 NOTE — Progress Notes (Signed)
Cardiology Office Note   Date:  09/05/2019   ID:  Wylee, Rivere [birth year], MRN [id number]  PCP:  Chevis Pretty, FNP  Cardiologist:   No primary care provider on file.   Chief Complaint  Patient presents with  . Palpitations      History of Present Illness: Nishitha Duddy is a 69 y.o. female who presents for evaluation of palpitations. She apparently had a history of atrial fibrillation at [facility] in 2014 although I don't have these records and was never able to get them.   At a previous visit she did wear an event monitor which demonstrated no fibrillation although she did have premature atrial and ventricular contractions. Holter monitor was unremarkable.   I have not seen her since 2017.  She has had breast cancer with a mastectomy since I saw her.  She is undergoing therapy as below.  He said that since then she has had symptoms of her heart racing.  It happens almost daily.  She feels that going fast and has recorded it on her pulse ox at 138.  It is sporadic.  She cannot bring it on although it is more likely with activity.  She is describing shortness of breath but she says her oxygen saturations stayed in the 96 to 98% range.  She is not had any presyncope or syncope.  She is exhausted.  She has decreased exercise tolerance.  She is getting some chest discomfort that she associates this with her Port-A-Cath placement.  She is not describing substernal chest discomfort, neck or arm discomfort.  She is not having any new PND or orthopnea.  Has not had any new edema.  She is trying to get her weight down.  Of note I could not exclude a response to Arimidex as responsible given the timing.  Of note she does not think her palpitations are related to panic which she has before.   Past Medical History:  Diagnosis Date  . A-fib ([place]) 11/2012   PAF  . Anxiety    takes Ativan  . Asthma 04/07/2011   dx  . Bipolar disorder ([place])   . Cancer [facility])    Right breast  . Depression   . Early cataracts, bilateral   . Fatty liver   . Fibromyalgia   . GERD (gastroesophageal reflux disease)   . Irritable bowel syndrome   . Mental disorder    dx bipolar  . PONV (postoperative nausea and vomiting)   . Sleep apnea 04/2011    does not wear CPAP    Past Surgical History:  Procedure Laterality Date  . ABDOMINAL HYSTERECTOMY    . COLONOSCOPY    . DILATION AND CURETTAGE OF UTERUS  1981   abnormal pap  . KNEE ARTHROSCOPY Left 2007   x 2  . LUMBAR LAMINECTOMY/DECOMPRESSION MICRODISCECTOMY  05/31/2011   Procedure: LUMBAR LAMINECTOMY/DECOMPRESSION MICRODISCECTOMY;  Surgeon: Johnn Hai;  Location: [facility];  Service: Orthopedics;  Laterality: Left;  Decompression Lumbar four to five and  Lumbar five to Sacral one on Left  (X-Ray)  . MASTECTOMY W/ SENTINEL NODE BIOPSY Right 09/16/2018   Procedure: RIGHT MASTECTOMY WITH RIGHT AXILLARY SENTINEL LYMPH NODE BIOPSY;  Surgeon: Alphonsa Overall, MD;  Location: [place];  Service: General;  Laterality: Right;  . PARTIAL HYSTERECTOMY  1982  . PORTACATH PLACEMENT Left 10/31/2018   Procedure: INSERTION PORT-A-CATH WITH ULTRASOUND;  Surgeon: Alphonsa Overall, MD;  Location: [place];  Service: General;  Laterality:  Left;  . TONSILLECTOMY     as child  . TOTAL KNEE ARTHROPLASTY Left 12/18/2005     Current Outpatient Medications  Medication Sig Dispense Refill  . anastrozole (ARIMIDEX) 1 MG tablet Take 1 tablet (1 mg total) by mouth daily. 90 tablet 3  . benzonatate (TESSALON PERLES) 100 MG capsule Take 1 capsule (100 mg total) by mouth 3 (three) times daily as needed for cough. 20 capsule 0  . clotrimazole-betamethasone (LOTRISONE) cream Apply 1 application topically 2 (two) times daily. 30 g 0  . esomeprazole (NEXIUM) 40 MG capsule Take 1 capsule (40 mg total) by mouth daily. 90 capsule 1  . gabapentin (NEURONTIN) 300 MG capsule Take 300 mg by mouth 3 (three) times daily.    Marland Kitchen ibandronate (BONIVA)  150 MG tablet Take 1 tablet (150 mg total) by mouth every 30 (thirty) days. Take in the morning with a full glass of water, on an empty stomach, and do not take anything else by mouth or lie down for the next 30 min. 3 tablet 4  . lamoTRIgine (LAMICTAL) 100 MG tablet Take 100 mg by mouth in the morning, at noon, and at bedtime.    Marland Kitchen loratadine (CLARITIN) 10 MG tablet Take 10 mg by mouth daily.    Marland Kitchen LORazepam (ATIVAN) 0.5 MG tablet Take 1 tablet (0.5 mg total) by mouth at bedtime as needed (Nausea or vomiting). 30 tablet 0  . promethazine-dextromethorphan (PROMETHAZINE-DM) 6.25-15 MG/5ML syrup TAKE 5 MILLILITERS BY MOUTH 4 TIMES DAILY AS NEEDED FOR COUGH    . Vilazodone HCl (VIIBRYD) 40 MG TABS Take 40 mg by mouth daily.     No current facility-administered medications for this visit.    Allergies:   Iohexol, Codeine, Prednisone, Sulfonamide derivatives, Celecoxib, Sulfa antibiotics, and Vitamin b12    Social History:  The patient  reports that she has never smoked. She has never used smokeless tobacco. She reports that she does not drink alcohol or use drugs.   Family History:  The patient's family history includes Anesthesia problems in her mother and sister; Breast cancer in her maternal aunt; Colon cancer in her maternal aunt; Colon polyps in her father; Heart disease in her father and mother; Heart failure in her mother; Hypertension in her sister.    ROS:  Please see the history of present illness.   Otherwise, review of systems are positive for cough (Tylenol sinus problems, allergies, panic attacks.   All other systems are reviewed and negative.    PHYSICAL EXAM: VS:  BP 120/74   Pulse 61   Ht 5\' 4"  (1.626 m)   Wt 218 lb 12.8 oz (99.2 kg)   SpO2 98%   BMI 37.56 kg/m  , BMI Body mass index is 37.56 kg/m. GENERAL:  Well appearing HEENT:  Pupils equal round and reactive, fundi not visualized, oral mucosa unremarkable NECK:  No jugular venous distention, waveform within normal  limits, carotid upstroke brisk and symmetric, no bruits, no thyromegaly LYMPHATICS:  No cervical, inguinal adenopathy LUNGS:  Clear to auscultation bilaterally BACK:  No CVA tenderness CHEST:  Right mastectomy.  Porta cath HEART:  PMI not displaced or sustained,S1 and S2 within normal limits, no S3, no S4, no clicks, no rubs, no murmurs ABD:  Flat, positive bowel sounds normal in frequency in pitch, no bruits, no rebound, no guarding, no midline pulsatile mass, no hepatomegaly, no splenomegaly EXT:  2 plus pulses throughout, no edema, no cyanosis no clubbing SKIN:  No rashes no nodules NEURO:  Cranial nerves II through XII grossly intact, motor grossly intact throughout PSYCH:  Cognitively intact, oriented to person place and time    EKG:  EKG is ordered today. The ekg ordered today demonstrates sinus rhythm, rate 68, axis within normal limits, intervals within normal limits, no acute ST-T wave changes.   Recent Labs: 07/23/2019: ALT 9; BUN 15; Creatinine, Ser 0.88; Hemoglobin 11.8; Platelets 249; Potassium 4.2; Sodium 142    Lipid Panel    Component Value Date/Time   CHOL 177 05/15/2019 1252   TRIG 116 05/15/2019 1252   TRIG 114 11/03/2014 1119   HDL 76 05/15/2019 1252   HDL 82 11/03/2014 1119   CHOLHDL 2.3 05/15/2019 1252   LDLCALC 81 05/15/2019 1252   LDLCALC 94 10/01/2013 0926      Wt Readings from Last 3 Encounters:  09/05/19 218 lb 12.8 oz (99.2 kg)  07/23/19 213 lb 11.2 oz (96.9 kg)  05/15/19 217 lb (98.4 kg)      Other studies Reviewed: Additional studies/ records that were reviewed today include: None. Review of the above records demonstrates:  Please see elsewhere in the note.     ASSESSMENT AND PLAN:  PALPITATIONS:   I am going to have her wear a 3-day monitor to evaluate further.  I do not see a recent thyroid study but will check this.  Further management will be based on these results.  SOB: I am going to check an echocardiogram and a BNP.  I have  strongly suspect acute heart failure valvular abnormalities however.  COVID EDUCATION:    We helped her sign of for the vaccine.    Current medicines are reviewed at length with the patient today.  The patient does not have concerns regarding medicines.  The following changes have been made:  no change  Labs/ tests ordered today include:   Orders Placed This Encounter  Procedures  . Brain natriuretic peptide  . TSH  . LONG TERM MONITOR (3-14 DAYS)  . EKG 12-Lead  . ECHOCARDIOGRAM COMPLETE     Disposition:   FU with me after the above studies     Signed, Minus Breeding, MD  09/05/2019 12:37 PM    [place]

## 2019-09-05 ENCOUNTER — Other Ambulatory Visit: Payer: Self-pay

## 2019-09-05 ENCOUNTER — Encounter: Payer: Self-pay | Admitting: Cardiology

## 2019-09-05 ENCOUNTER — Ambulatory Visit (INDEPENDENT_AMBULATORY_CARE_PROVIDER_SITE_OTHER): Payer: BC Managed Care – PPO | Admitting: Cardiology

## 2019-09-05 ENCOUNTER — Telehealth: Payer: Self-pay | Admitting: Radiology

## 2019-09-05 VITALS — BP 120/74 | HR 61 | Ht 64.0 in | Wt 218.8 lb

## 2019-09-05 DIAGNOSIS — R002 Palpitations: Secondary | ICD-10-CM | POA: Diagnosis not present

## 2019-09-05 DIAGNOSIS — Z9181 History of falling: Secondary | ICD-10-CM

## 2019-09-05 DIAGNOSIS — W19XXXS Unspecified fall, sequela: Secondary | ICD-10-CM

## 2019-09-05 DIAGNOSIS — Z7189 Other specified counseling: Secondary | ICD-10-CM

## 2019-09-05 DIAGNOSIS — R0600 Dyspnea, unspecified: Secondary | ICD-10-CM

## 2019-09-05 NOTE — Telephone Encounter (Signed)
Enrolled patient for a 3 day Zio monitor to be mailed to patients home.  

## 2019-09-05 NOTE — Patient Instructions (Addendum)
Medication Instructions:  No Changes *If you need a refill on your cardiac medications before your next appointment, please call your pharmacy*   Lab Work: Your physician recommends that you return for lab work today (BNP, TSH) If you have labs (blood work) drawn today and your tests are completely normal, you will receive your results only by: Marland Kitchen MyChart Message (if you have MyChart) OR . A paper copy in the mail If you have any lab test that is abnormal or we need to change your treatment, we will call you to review the results.   Testing/Procedures: Your physician has recommended that you wear an event monitor. Event monitors are medical devices that record the heart's electrical activity. Doctors most often [place] these monitors to diagnose arrhythmias. Arrhythmias are problems with the speed or rhythm of the heartbeat. The monitor is a small, portable device. You can wear one while you do your normal daily activities. This is usually used to diagnose what is causing palpitations/syncope (passing out).  Your physician has requested that you have an echocardiogram. Echocardiography is a painless test that uses sound waves to create images of your heart. It provides your doctor with information about the size and shape of your heart and how well your heart's chambers and valves are working. This procedure takes approximately one hour. There are no restrictions for this procedure. [place]   Follow-Up: At [facility], you and your health needs are our priority.  As part of our continuing mission to provide you with exceptional heart care, we have created designated Provider Care Teams.  These Care Teams include your primary Cardiologist (physician) and Advanced Practice Providers (APPs -  Physician Assistants and Nurse Practitioners) who all work together to provide you with the care you need, when you need it.  We recommend signing up for the patient portal called  "MyChart".  Sign up information is provided on this After Visit Summary.  MyChart is used to connect with patients for Virtual Visits (Telemedicine).  Patients are able to view lab/test results, encounter notes, upcoming appointments, etc.  Non-urgent messages can be sent to your provider as well.   To learn more about what you can do with MyChart, go to [email or web address].    Your next appointment:   1 month(s)  The format for your next appointment:   In Person  Provider:   Minus Breeding, MD    Other Instructions: Bryn Gulling- Long Term Monitor Instructions   Your physician has requested you wear your ZIO patch monitor for 3 days.   This is a single patch monitor.  [employer] supplies one patch monitor per enrollment.  Additional stickers are not available.   Please do not apply patch if you will be having a Nuclear Stress Test, Echocardiogram, Cardiac CT, MRI, or Chest Xray during the time frame you would be wearing the monitor. The patch cannot be worn during these tests.  You cannot remove and re-apply the ZIO XT patch monitor.   Your ZIO patch monitor will be sent USPS Priority mail from [facility] directly to your home address. The monitor may also be mailed to a PO BOX if home delivery is not available.   It may take 3-5 days to receive your monitor after you have been enrolled.   Once you have received you monitor, please review enclosed instructions.  Your monitor has already been registered assigning a specific monitor serial # to you.   Applying the monitor  Shave hair from upper left chest.   Hold abrader disc by orange tab.  Rub abrader in 40 strokes over left upper chest as indicated in your monitor instructions.   Clean area with 4 enclosed alcohol pads .  Use all pads to assure are is cleaned thoroughly.  Let dry.   Apply patch as indicated in monitor instructions.  Patch will be place under collarbone on left side of chest with arrow pointing upward.    Rub patch adhesive wings for 2 minutes.Remove white label marked "1".  Remove white label marked "2".  Rub patch adhesive wings for 2 additional minutes.   While looking in a mirror, press and release button in center of patch.  A small green light will flash 3-4 times .  This will be your only indicator the monitor has been turned on.     Do not shower for the first 24 hours.  You may shower after the first 24 hours.   Press button if you feel a symptom. You will hear a small click.  Record Date, Time and Symptom in the Patient Log Book.   When you are ready to remove patch, follow instructions on last 2 pages of Patient Log Book.  Stick patch monitor onto last page of Patient Log Book.   Place Patient Log Book in [place] box.  Use locking tab on box and tape box closed securely.  The [employer] has [employer] on it.  Please place in mailbox as soon as possible.  Your physician should have your test results approximately 7 days after the monitor has been mailed back to [facility].   Call [place] at [phone] if you have questions regarding your ZIO XT patch monitor.  Call them immediately if you see an orange light blinking on your monitor.   If your monitor falls off in less than 4 days contact our Monitor department at [phone].  If your monitor becomes loose or falls off after 4 days call [employer] at [phone] for suggestions on securing your monitor.

## 2019-09-06 LAB — TSH: TSH: 1.52 u[IU]/mL (ref 0.450–4.500)

## 2019-09-06 LAB — BRAIN NATRIURETIC PEPTIDE: BNP: 107.1 pg/mL — ABNORMAL HIGH (ref 0.0–100.0)

## 2019-09-09 DIAGNOSIS — F410 Panic disorder [episodic paroxysmal anxiety] without agoraphobia: Secondary | ICD-10-CM | POA: Diagnosis not present

## 2019-09-09 DIAGNOSIS — F312 Bipolar disorder, current episode manic severe with psychotic features: Secondary | ICD-10-CM | POA: Diagnosis not present

## 2019-09-09 DIAGNOSIS — F4310 Post-traumatic stress disorder, unspecified: Secondary | ICD-10-CM | POA: Diagnosis not present

## 2019-09-11 ENCOUNTER — Ambulatory Visit: Payer: BC Managed Care – PPO

## 2019-09-12 DIAGNOSIS — R05 Cough: Secondary | ICD-10-CM | POA: Diagnosis not present

## 2019-09-12 DIAGNOSIS — R0602 Shortness of breath: Secondary | ICD-10-CM | POA: Diagnosis not present

## 2019-09-12 DIAGNOSIS — E669 Obesity, unspecified: Secondary | ICD-10-CM | POA: Diagnosis not present

## 2019-09-12 DIAGNOSIS — G4733 Obstructive sleep apnea (adult) (pediatric): Secondary | ICD-10-CM | POA: Diagnosis not present

## 2019-09-12 DIAGNOSIS — R911 Solitary pulmonary nodule: Secondary | ICD-10-CM | POA: Diagnosis not present

## 2019-09-17 ENCOUNTER — Encounter: Payer: Self-pay | Admitting: Cardiology

## 2019-09-22 ENCOUNTER — Telehealth: Payer: Self-pay | Admitting: Cardiology

## 2019-09-22 NOTE — Telephone Encounter (Signed)
Returned call to patient.She stated she is very anxious.She wants to wait to have test done ( 3 day monitor and echo)until after her port is removed.Stated she is scheduled to have port removed 4/8.Stated she also wants to wear monitor longer than 3 days.She feels like 3 days is not long enough.Advised I will send message to schedulers to reschedule appointments.I will send message to Dr.Hochrein for a 30 day event monitor order.

## 2019-09-22 NOTE — Telephone Encounter (Signed)
Patient states she is calling to inquire about whether or not her port insertion will interfere with her echocardiogram scheduled for 09/25/19. Please advise.

## 2019-09-22 NOTE — Telephone Encounter (Signed)
Called patient to reschedule Echo and follow up with Dr. Esaw Grandchild wants [place] to call the week of 10/27/19---her port is being removed on 10/16/19.

## 2019-09-25 ENCOUNTER — Other Ambulatory Visit (HOSPITAL_COMMUNITY): Payer: BC Managed Care – PPO

## 2019-09-29 ENCOUNTER — Telehealth: Payer: Self-pay | Admitting: Emergency Medicine

## 2019-09-29 ENCOUNTER — Telehealth: Payer: Self-pay | Admitting: *Deleted

## 2019-09-29 NOTE — Telephone Encounter (Signed)
Called patient to change [facility] appt from 1000 tomorrow (3/23) to 1130, pt agreed to appt time change.  Appt changed in pt's chart.

## 2019-09-29 NOTE — Telephone Encounter (Signed)
I am happy to see her today or tomorrow.  Doris Smith

## 2019-09-29 NOTE — Progress Notes (Signed)
[facility]pt[facility] Progress Note   Doris Smith [id number] 1951-03-23 69 y.o.  Doris Smith is managed by Dr. Lurline Del  Actively treated with chemotherapy/immunotherapy/hormonal therapy: yes  Current therapy: anastrozole and ibandronate  Next scheduled appointment with provider: 01/20/2020  Assessment: Plan:    Malignant neoplasm of upper-outer quadrant of right breast in female, estrogen receptor positive ([place])  Breast pain   ER positive right breast cancer: Doris Smith continues to be managed by Dr. Jana Hakim and continues to be treated with anastrozole and ibandronate. She is scheduled to be seen next on 01/20/2020.  Right chest wall and shoulder pain: The patient was referred for x-ray of her right ribs and right shoulder with results showing:  FINDINGS: Oblique and Y scapular images were obtained.  There is osteoarthritic change in the acromioclavicular joint and glenohumeral joint regions.  No erosive change or intra-articular calcification.  No blastic or lytic bone lesions.  Bones appear somewhat osteoporotic. No fracture or dislocation.  Visualized right lung clear.  Central catheter tip in superior vena cava near cavoatrial junction.  IMPRESSION: Generalized osteoarthritic change, slightly more severe in the acromioclavicular joint.  Underlying osteoporosis.  No blastic or lytic bone lesions.  No erosive change.  No fracture or dislocation.  Please see After Visit Summary for patient specific instructions.  Future Appointments  Date Time Provider [place]  09/30/2019 11:30 AM Sandi Mealy E., PA-C [facility] None  10/21/2019 10:30 AM Gardenia Phlegm, NP [facility] None  11/14/2019  2:00 PM Chevis Pretty, FNP [facility] None  01/20/2020 11:30 AM [facility] LAB 3 [facility] None  01/20/2020 12:00 PM Magrinat, Virgie Dad, MD [facility] None  01/20/2020 12:45 PM [facility] FLUSH [facility] None    No orders of  the defined types were placed in this encounter.      Subjective:   Patient ID:  Doris Smith is a 69 y.o. (DOB September 09, 1950) female.  Chief Complaint: No chief complaint on file.   HPI Doris Smith  is a 69 y.o. female with a diagnosis of an ER positive right breast cancer. She is followed by Dr. Jana Hakim and continues to be treated with anastrozole and ibandronate. She presents to the office today after called yesterday stating that she is having increased discomfort on her right chest for the last 2 weeks. She reported that her pain is increasing. She denied swelling but stated that the area is tender to the touch. She has also noted irregularity in the tissue around the surgical site.   Medications: I have reviewed the patient's current medications.  Allergies:  Allergies  Allergen Reactions  . Iohexol     "over 20 years ago" had reaction "hurting in arm and heart"-Done in [place], [place].Marland Kitchenphysician stopped CT at that time.  . Codeine Other (See Comments)    Makes feel like she is wild   . Prednisone Other (See Comments)    Makes me very irritable  . Sulfonamide Derivatives Other (See Comments)    Upset stomach  . Celecoxib Nausea And Vomiting  . Sulfa Antibiotics Nausea And Vomiting  . Vitamin B12 Rash    Past Medical History:  Diagnosis Date  . A-fib ([place]) 11/2012   PAF  . Anxiety    takes Ativan  . Asthma 04/07/2011   dx  . Bipolar disorder ([place])   . Cancer [facility])    Right breast  . Depression   . Early cataracts, bilateral   . Fatty liver   . Fibromyalgia   .  GERD (gastroesophageal reflux disease)   . Irritable bowel syndrome   . Mental disorder    dx bipolar  . PONV (postoperative nausea and vomiting)   . Sleep apnea 04/2011    does not wear CPAP    Past Surgical History:  Procedure Laterality Date  . ABDOMINAL HYSTERECTOMY    . COLONOSCOPY    . DILATION AND CURETTAGE OF UTERUS  1981   abnormal pap  . KNEE ARTHROSCOPY Left 2007   x 2  .  LUMBAR LAMINECTOMY/DECOMPRESSION MICRODISCECTOMY  05/31/2011   Procedure: LUMBAR LAMINECTOMY/DECOMPRESSION MICRODISCECTOMY;  Surgeon: Johnn Hai;  [facility];  Service: Orthopedics;  Laterality: Left;  Decompression Lumbar four to five and  Lumbar five to Sacral one on Left  (X-Ray)  . MASTECTOMY W/ SENTINEL NODE BIOPSY Right 09/16/2018   Procedure: RIGHT MASTECTOMY WITH RIGHT AXILLARY SENTINEL LYMPH NODE BIOPSY;  Surgeon: Alphonsa Overall, MD;  Location: [place];  Service: General;  Laterality: Right;  . PARTIAL HYSTERECTOMY  1982  . PORTACATH PLACEMENT Left 10/31/2018   Procedure: INSERTION PORT-A-CATH WITH ULTRASOUND;  Surgeon: Alphonsa Overall, MD;  Location: [place];  Service: General;  Laterality: Left;  . TONSILLECTOMY     as child  . TOTAL KNEE ARTHROPLASTY Left 12/18/2005    Family History  Problem Relation Age of Onset  . Anesthesia problems Mother   . Heart disease Mother        CHF, atrial fib  . Heart failure Mother   . Anesthesia problems Sister   . Colon polyps Father   . Heart disease Father        "Fluid around the heart"  . Hypertension Sister   . Breast cancer Maternal Aunt   . Colon cancer Maternal Aunt   . Prostate cancer Neg Hx   . Ovarian cancer Neg Hx     Social History   Socioeconomic History  . Marital status: Married    Spouse name: [name]  . Number of children: 2  . Years of education: 41  . Highest education level: Not on file  Occupational History  . Occupation: Disabled  Tobacco Use  . Smoking status: Never Smoker  . Smokeless tobacco: Never Used  Substance and Sexual Activity  . Alcohol use: No  . Drug use: No  . Sexual activity: Yes    Birth control/protection: Post-menopausal, Surgical  Other Topics Concern  . Not on file  Social History Narrative   Tea daily.  Rarely has caffeine    Social Determinants of Radio broadcast assistant Strain:   . Difficulty of Paying Living Expenses:   Food Insecurity:   .  Worried About Charity fundraiser in the Last Year:   . Arboriculturist in the Last Year:   Transportation Needs:   . Film/video editor (Medical):   Marland Kitchen Lack of Transportation (Non-Medical):   Physical Activity:   . Days of Exercise per Week:   . Minutes of Exercise per Session:   Stress:   . Feeling of Stress :   Social Connections:   . Frequency of Communication with Friends and Family:   . Frequency of Social Gatherings with Friends and Family:   . Attends Religious Services:   . Active Member of Clubs or Organizations:   . Attends Archivist Meetings:   Marland Kitchen Marital Status:   Intimate Partner Violence:   . Fear of Current or Ex-Partner:   . Emotionally Abused:   Marland Kitchen Physically Abused:   .  Sexually Abused:     Past Medical History, Surgical history, Social history, and Family history were reviewed and updated as appropriate.   Please see review of systems for further details on the patient's review from today.   Review of Systems:  Review of Systems  Constitutional: Negative for chills, diaphoresis and fever.  HENT: Negative for trouble swallowing and voice change.   Respiratory: Negative for cough, chest tightness, shortness of breath and wheezing.   Cardiovascular: Positive for chest pain (Right chest wall pain at mastectomy site.). Negative for palpitations.  Gastrointestinal: Negative for abdominal pain, constipation, diarrhea, nausea and vomiting.  Musculoskeletal: Negative for back pain and myalgias.  Neurological: Negative for dizziness, light-headedness and headaches.    Objective:   Physical Exam:  There were no vitals taken for this visit. ECOG: 0  Physical Exam Constitutional:      General: She is not in acute distress.    Appearance: She is not diaphoretic.  HENT:     Head: Normocephalic and atraumatic.  Eyes:     General: No scleral icterus.       Right eye: No discharge.        Left eye: No discharge.     Conjunctiva/sclera: Conjunctivae  normal.  Cardiovascular:     Rate and Rhythm: Normal rate and regular rhythm.     Heart sounds: Normal heart sounds. No murmur. No friction rub. No gallop.      Comments:  Right chest wall status post mastectomy with a well-healed surgical incision.  There is an area of diffuse soft fullness immediately inferior to the surgical incision which is consistent with either a seroma or fatty tissue.  The right chest wall is tender to palpation along the surgical site over the area where the overlying tissue has been removed from the ribs. Pulmonary:     Effort: Pulmonary effort is normal. No respiratory distress.     Breath sounds: Normal breath sounds. No stridor. No wheezing or rales.  Musculoskeletal:        General: Tenderness (Tenderness is noted over his knee right inferior scapula.) present. No swelling or deformity. Normal range of motion.     Cervical back: Normal range of motion and neck supple.  Lymphadenopathy:     Head:     Right side of head: No submandibular, tonsillar, preauricular, posterior auricular or occipital adenopathy.     Left side of head: No submandibular, tonsillar, preauricular, posterior auricular or occipital adenopathy.     Cervical: No cervical adenopathy.     Upper Body:     Right upper body: No supraclavicular or epitrochlear adenopathy.     Left upper body: No supraclavicular or epitrochlear adenopathy.  Skin:    General: Skin is warm and dry.     Findings: No erythema or rash.  Neurological:     Mental Status: She is alert.     Coordination: Coordination normal.  Psychiatric:        Behavior: Behavior normal.        Thought Content: Thought content normal.        Judgment: Judgment normal.     Lab Review:     Component Value Date/Time   NA 142 07/23/2019 1155   NA 141 05/15/2019 1252   K 4.2 07/23/2019 1155   CL 105 07/23/2019 1155   CO2 26 07/23/2019 1155   GLUCOSE 86 07/23/2019 1155   BUN 15 07/23/2019 1155   BUN 11 05/15/2019 1252    CREATININE 0.88 07/23/2019  1155   CREATININE 0.90 12/19/2018 1434   CALCIUM 8.6 (L) 07/23/2019 1155   PROT 6.8 07/23/2019 1155   PROT 6.3 05/15/2019 1252   ALBUMIN 3.7 07/23/2019 1155   ALBUMIN 3.9 05/15/2019 1252   AST 14 (L) 07/23/2019 1155   AST 14 (L) 08/14/2018 0758   ALT 9 07/23/2019 1155   ALT 10 08/14/2018 0758   ALKPHOS 106 07/23/2019 1155   BILITOT 0.4 07/23/2019 1155   BILITOT 0.3 05/15/2019 1252   BILITOT 0.5 08/14/2018 0758   GFRNONAA >60 07/23/2019 1155   GFRNONAA >60 12/19/2018 1434   GFRAA >60 07/23/2019 1155   GFRAA >60 12/19/2018 1434       Component Value Date/Time   WBC 4.8 07/23/2019 1155   RBC 4.15 07/23/2019 1155   HGB 11.8 (L) 07/23/2019 1155   HGB 12.7 08/14/2018 0758   HGB 14.0 10/13/2016 1045   HCT 36.7 07/23/2019 1155   HCT 42.0 10/13/2016 1045   PLT 249 07/23/2019 1155   PLT 242 08/14/2018 0758   PLT 267 10/13/2016 1045   MCV 88.4 07/23/2019 1155   MCV 88 10/13/2016 1045   MCH 28.4 07/23/2019 1155   MCHC 32.2 07/23/2019 1155   RDW 13.6 07/23/2019 1155   RDW 14.1 10/13/2016 1045   LYMPHSABS 0.7 07/23/2019 1155   LYMPHSABS 1.6 10/13/2016 1045   MONOABS 0.6 07/23/2019 1155   EOSABS 0.1 07/23/2019 1155   EOSABS 0.1 10/13/2016 1045   BASOSABS 0.1 07/23/2019 1155   BASOSABS 0.1 10/13/2016 1045   -------------------------------  Imaging from last 24 hours (if applicable):  Radiology interpretation: No results found.

## 2019-09-29 NOTE — Telephone Encounter (Signed)
This RN spoke with pt per her call stating she is having increased discomfort on breast that has history of breast cancer.  She states the discomfort has been present for about 2 weeks - now increasing.  She cannot say that there is any swelling but she stated discomfort to touch, and some noted irregularity in the tissue around the surgical site.  She states she " really doesn't look at it ".  Per discussion- pt will come into symptom management for review ( due to Dr Jannifer Rodney is booked and pt declines to see [facility]/NP ).

## 2019-09-30 ENCOUNTER — Other Ambulatory Visit: Payer: Self-pay

## 2019-09-30 ENCOUNTER — Ambulatory Visit (HOSPITAL_COMMUNITY)
Admission: RE | Admit: 2019-09-30 | Discharge: 2019-09-30 | Disposition: A | Discharge status: Home / Self Care | Payer: BC Managed Care – PPO | Source: Ambulatory Visit | Attending: Medical | Admitting: Medical

## 2019-09-30 ENCOUNTER — Inpatient Hospital Stay: Payer: BC Managed Care – PPO | Attending: Oncology | Admitting: Medical

## 2019-09-30 VITALS — BP 143/72 | HR 61 | Temp 98.2°F | Resp 20 | Ht 64.0 in | Wt 216.7 lb

## 2019-09-30 DIAGNOSIS — C50411 Malignant neoplasm of upper-outer quadrant of right female breast: Secondary | ICD-10-CM

## 2019-09-30 DIAGNOSIS — K449 Diaphragmatic hernia without obstruction or gangrene: Secondary | ICD-10-CM | POA: Diagnosis not present

## 2019-09-30 DIAGNOSIS — M25511 Pain in right shoulder: Secondary | ICD-10-CM | POA: Diagnosis not present

## 2019-09-30 DIAGNOSIS — R0781 Pleurodynia: Secondary | ICD-10-CM | POA: Insufficient documentation

## 2019-09-30 DIAGNOSIS — N644 Mastodynia: Secondary | ICD-10-CM

## 2019-09-30 DIAGNOSIS — Z170 Estrogen receptor positive status [ER+]: Secondary | ICD-10-CM | POA: Diagnosis not present

## 2019-09-30 DIAGNOSIS — M25519 Pain in unspecified shoulder: Secondary | ICD-10-CM | POA: Insufficient documentation

## 2019-09-30 DIAGNOSIS — Z79811 Long term (current) use of aromatase inhibitors: Secondary | ICD-10-CM | POA: Diagnosis not present

## 2019-09-30 DIAGNOSIS — C50211 Malignant neoplasm of upper-inner quadrant of right female breast: Secondary | ICD-10-CM | POA: Diagnosis not present

## 2019-09-30 DIAGNOSIS — M19011 Primary osteoarthritis, right shoulder: Secondary | ICD-10-CM | POA: Diagnosis not present

## 2019-09-30 DIAGNOSIS — R0789 Other chest pain: Secondary | ICD-10-CM

## 2019-09-30 NOTE — Progress Notes (Signed)
These results were called to Anders Simmonds Kaner and were reviewed with her . Her questions were answered. She expressed understanding.

## 2019-09-30 NOTE — Progress Notes (Signed)
These results were called to Doris Smith and were reviewed with her . Her questions were answered. She expressed understanding.

## 2019-10-01 NOTE — Telephone Encounter (Signed)
Pt left me a message this morning stating she was hoping you could call her " I just got a couple of questions I need to ask "  Her return call number is [phone].  I am glad to call her if you want me to first but just wanted to know how you do things - thanks - Val

## 2019-10-01 NOTE — Telephone Encounter (Signed)
I called Doris Smith. We reviewed her x-rays from yesterday. They were negative except for arthritis. I offered to sent her top PT for "soreness" in her right chest after she recovers from having her port removed next month.  Doris Smith

## 2019-10-02 ENCOUNTER — Other Ambulatory Visit: Payer: Self-pay | Admitting: Nurse Practitioner

## 2019-10-02 DIAGNOSIS — L293 Anogenital pruritus, unspecified: Secondary | ICD-10-CM

## 2019-10-02 NOTE — Telephone Encounter (Signed)
Last office visit 05/14/2020 Last refill 07/11/18, 30 grams, no refills

## 2019-10-06 NOTE — Telephone Encounter (Signed)
Thank you Val.

## 2019-10-07 ENCOUNTER — Telehealth: Payer: Self-pay | Admitting: Cardiology

## 2019-10-07 DIAGNOSIS — K449 Diaphragmatic hernia without obstruction or gangrene: Secondary | ICD-10-CM | POA: Diagnosis not present

## 2019-10-07 DIAGNOSIS — R079 Chest pain, unspecified: Secondary | ICD-10-CM | POA: Diagnosis not present

## 2019-10-07 DIAGNOSIS — R0602 Shortness of breath: Secondary | ICD-10-CM | POA: Diagnosis not present

## 2019-10-07 DIAGNOSIS — R002 Palpitations: Secondary | ICD-10-CM | POA: Diagnosis not present

## 2019-10-07 DIAGNOSIS — R000 Tachycardia, unspecified: Secondary | ICD-10-CM | POA: Diagnosis not present

## 2019-10-07 DIAGNOSIS — Z209 Contact with and (suspected) exposure to unspecified communicable disease: Secondary | ICD-10-CM | POA: Diagnosis not present

## 2019-10-07 DIAGNOSIS — R0789 Other chest pain: Secondary | ICD-10-CM | POA: Diagnosis not present

## 2019-10-07 DIAGNOSIS — I4891 Unspecified atrial fibrillation: Secondary | ICD-10-CM | POA: Diagnosis not present

## 2019-10-07 NOTE — Telephone Encounter (Signed)
  Patient c/o Palpitations:  High priority if patient c/o lightheadedness, shortness of breath, or chest pain  1) How long have you had palpitations/irregular HR/ Afib? Are you having the symptoms now? No   2) Are you currently experiencing lightheadedness, SOB or CP? Lightheaded   3) Do you have a history of afib (atrial fibrillation) or irregular heart rhythm? Yes, states hospital diagnosed her  4) Have you checked your BP or HR? (document readings if available): HR 62  5) Are you experiencing any other symptoms? Fatigued    Patient states she was in the hospital last night for afib. She states her heart was fluttering and it woke her up. She states her HR went up to 147 and they gave her a medication. She states it helped get rid of the fluttering, but she is now very nausea and lightheaded. She states she feels like she may pass out. She also states the hospital put her on metoprolol.

## 2019-10-07 NOTE — Telephone Encounter (Signed)
Spoke with patient. Patient reports "i'm lightheaded like I might pass out." Patient upset on the phone crying because she wants to figure out her a-fib. Patient has not completed her heart monitor or echo. She has a port removal on 4/8 and then afterwards she will complete testing. Last night patient was seen in [facility] for a-fib. She went into afib around 10p with a HR of 147 and fluctuating. Per patient, she was given IV medication and went back into normal sinus rhythm. Patient is still in NSR with a HR of 67. Patient anxious. Spoke with patient and able to calm patient down after talking for awhile. Patient would like Dr. Percival Spanish to know she was in a-fib and given metoprolol 50mg  BID for the next 28 days. Patient advised to pick up the prescription and to start the medication. Patient advised to take it easy and hydrate. Patient advised to call back if symptoms worsen or she develops afib again. Patient placed on schedule with Jory Sims, NP for 4/7. Confirmed with DOD Dr. Sallyanne Kuster no other action needed.

## 2019-10-10 DIAGNOSIS — Z01818 Encounter for other preprocedural examination: Secondary | ICD-10-CM | POA: Diagnosis not present

## 2019-10-13 ENCOUNTER — Ambulatory Visit: Payer: BC Managed Care – PPO | Admitting: Cardiology

## 2019-10-13 NOTE — Progress Notes (Signed)
Cardiology Office Note   Date:  10/15/2019   ID:  Doris Smith, DOB Jun 24, 1951, MRN [id number]  PCP:  Chevis Pretty, FNP  Cardiologist: Dr. Percival Spanish  CC: Post ED visit   History of Present Illness: Doris Smith is a 69 y.o. female who presents for ongoing assessment and management of palpitations, prior history of atrial fib. Other history includes breast cancer with a mastectomy. She is getting some chest discomfort that she associates this with her Port-A-Cath placement.  When last seen by Dr. Percival Spanish on 09/05/2019 with an echocardiogram and a BNP.   Unfortunately, she was seen at [facility] ED for atrial fib with RVR on 10/07/2019. On review of records, she was ruled out for ACS, she was found to have low normal potassium at 3.6, she was not anemic. She was given diltiazem bolus and converted to NSR.  She was started on metoprolol tartrate 50 mg BID for 2 weeks.   She also has a "bite" on her lower right leg just proximal to the ankle that is painful and red. She is very tearful as she is still having issues recovering from breast cancer and mastectomy.   Past Medical History:  Diagnosis Date  . A-fib ([place]) 11/2012   PAF  . Anxiety    takes Ativan  . Asthma 04/07/2011   dx  . Bipolar disorder ([place])   . Cancer [facility])    Right breast  . Depression   . Early cataracts, bilateral   . Fatty liver   . Fibromyalgia   . GERD (gastroesophageal reflux disease)   . Irritable bowel syndrome   . Mental disorder    dx bipolar  . PONV (postoperative nausea and vomiting)   . Sleep apnea 04/2011    does not wear CPAP    Past Surgical History:  Procedure Laterality Date  . ABDOMINAL HYSTERECTOMY    . COLONOSCOPY    . DILATION AND CURETTAGE OF UTERUS  1981   abnormal pap  . KNEE ARTHROSCOPY Left 2007   x 2  . LUMBAR LAMINECTOMY/DECOMPRESSION MICRODISCECTOMY  05/31/2011   Procedure: LUMBAR LAMINECTOMY/DECOMPRESSION MICRODISCECTOMY;  Surgeon: Johnn Hai;   [facility];  Service: Orthopedics;  Laterality: Left;  Decompression Lumbar four to five and  Lumbar five to Sacral one on Left  (X-Ray)  . MASTECTOMY W/ SENTINEL NODE BIOPSY Right 09/16/2018   Procedure: RIGHT MASTECTOMY WITH RIGHT AXILLARY SENTINEL LYMPH NODE BIOPSY;  Surgeon: Alphonsa Overall, MD;  Location: [place];  Service: General;  Laterality: Right;  . PARTIAL HYSTERECTOMY  1982  . PORTACATH PLACEMENT Left 10/31/2018   Procedure: INSERTION PORT-A-CATH WITH ULTRASOUND;  Surgeon: Alphonsa Overall, MD;  Location: [place];  Service: General;  Laterality: Left;  . TONSILLECTOMY     as child  . TOTAL KNEE ARTHROPLASTY Left 12/18/2005     Current Outpatient Medications  Medication Sig Dispense Refill  . anastrozole (ARIMIDEX) 1 MG tablet Take 1 tablet (1 mg total) by mouth daily. 90 tablet 3  . benzonatate (TESSALON PERLES) 100 MG capsule Take 1 capsule (100 mg total) by mouth 3 (three) times daily as needed for cough. 20 capsule 0  . clotrimazole-betamethasone (LOTRISONE) cream APPLY  CREAM TOPICALLY TWICE DAILY 45 g 0  . esomeprazole (NEXIUM) 40 MG capsule Take 1 capsule (40 mg total) by mouth daily. 90 capsule 1  . gabapentin (NEURONTIN) 300 MG capsule Take 300 mg by mouth 3 (three) times daily.    Marland Kitchen ibandronate (BONIVA) 150 MG  tablet Take 1 tablet (150 mg total) by mouth every 30 (thirty) days. Take in the morning with a full glass of water, on an empty stomach, and do not take anything else by mouth or lie down for the next 30 min. 3 tablet 4  . lamoTRIgine (LAMICTAL) 100 MG tablet Take 100 mg by mouth in the morning, at noon, and at bedtime.    Marland Kitchen loratadine (CLARITIN) 10 MG tablet Take 10 mg by mouth daily.    Marland Kitchen LORazepam (ATIVAN) 0.5 MG tablet Take 1 tablet (0.5 mg total) by mouth at bedtime as needed (Nausea or vomiting). 30 tablet 0  . promethazine-dextromethorphan (PROMETHAZINE-DM) 6.25-15 MG/5ML syrup TAKE 5 MILLILITERS BY MOUTH 4 TIMES DAILY AS NEEDED FOR COUGH    .  Vilazodone HCl (VIIBRYD) 40 MG TABS Take 40 mg by mouth daily.    . cephALEXin (KEFLEX) 500 MG capsule Take 1 capsule (500 mg total) by mouth 2 (two) times daily. For 10 days 20 capsule 0  . metoprolol tartrate (LOPRESSOR) 50 MG tablet Take 1 tablet (50 mg total) by mouth 2 (two) times daily. 180 tablet 3   No current facility-administered medications for this visit.    Allergies:   Iohexol, Codeine, Prednisone, Sulfonamide derivatives, Celecoxib, Sulfa antibiotics, and Vitamin b12    Social History:  The patient  reports that she has never smoked. She has never used smokeless tobacco. She reports that she does not drink alcohol or use drugs.   Family History:  The patient's family history includes Anesthesia problems in her mother and sister; Breast cancer in her maternal aunt; Colon cancer in her maternal aunt; Colon polyps in her father; Heart disease in her father and mother; Heart failure in her mother; Hypertension in her sister.    ROS: All other systems are reviewed and negative. Unless otherwise mentioned in H&P    PHYSICAL EXAM: VS:  BP 130/63   Pulse (!) 51   Temp (!) 96.1 F (35.6 C)   Resp (!) 96   Ht 5\' 4"  (1.626 m)   Wt 216 lb 12.8 oz (98.3 kg)   BMI 37.21 kg/m  , BMI Body mass index is 37.21 kg/m. GEN: Well nourished, well developed, in no acute distress HEENT: normal Neck: no JVD, carotid bruits, or masses Cardiac: RRR; no murmurs, rubs, or gallops,no edema  Respiratory:  Clear to auscultation bilaterally, normal work of breathing GI: soft, nontender, nondistended, + BS MS: no deformity or atrophy, bite/wound on right medal lower leg proximal to the ankle. Area of erythema circumferential to the wound.   Skin: warm and dry, no rash Neuro:  Strength and sensation are intact Psych: euthymic mood, full affect   EKG: Sinus bradycardia rate of 51 bpm. (Personally reviewed).  Recent Labs: 07/23/2019: ALT 9; BUN 15; Creatinine, Ser 0.88; Hemoglobin 11.8; Platelets  249; Potassium 4.2; Sodium 142 09/05/2019: BNP 107.1; TSH 1.520    Lipid Panel    Component Value Date/Time   CHOL 177 05/15/2019 1252   TRIG 116 05/15/2019 1252   TRIG 114 11/03/2014 1119   HDL 76 05/15/2019 1252   HDL 82 11/03/2014 1119   CHOLHDL 2.3 05/15/2019 1252   LDLCALC 81 05/15/2019 1252   LDLCALC 94 10/01/2013 0926      Wt Readings from Last 3 Encounters:  10/15/19 216 lb 12.8 oz (98.3 kg)  09/30/19 216 lb 11.5 oz (98.3 kg)  09/05/19 218 lb 12.8 oz (99.2 kg)      Other studies Reviewed: Echocardiogram: 03/26/2015  Left ventricle: The cavity size was normal. Systolic function was  normal. The estimated ejection fraction was in the range of 55%  to 60%. Wall motion was normal; there were no regional wall  motion abnormalities. Left ventricular diastolic function  parameters were normal.  - Left atrium: The atrium was moderately dilated.  - Atrial septum: No defect or patent foramen ovale was identified.    ASSESSMENT AND PLAN:  1. Paroxsymal Atrial fib: She was seen at [facility] ED on 10/07/2019 with EKG documenting atrial fib with RVR. She was treated with IV diltiazem bolus. Now on metoprolol 50 mg BID. She states that she still feels some fluttering.  I will place a 30 day cardiac monitor to assess frequency and morphology of atrial fib. She will be on ASA 81 mg daily.   CHADS VASC Score of 2. Annal risk of stroke 2,3 %. She is now in NSR.   Will have echocardiogram completed as well for changes in LV fx and atrial size. May need to consider DOAC if she has continued PAF due to CHADS score of 2.   2. Hx of Breast Cancer: Has had mastectomy along with chemo and radiation. She has a port-a-cath which is going to removed tomorrow.  I will hold off on DOAC for now unless she has significant paroxysms of atrial fib.   3. Depression and anxiety: Very tearful concerning her breast cancer recovery. I have asked to her to speak with oncologist about support groups  or psychotherapy for assistance with this.   Current medicines are reviewed at length with the patient today.  I have spent 30 minutes dedicated to the care of this patient on the date of this encounter to include pre-visit review of records, assessment, management and diagnostic testing,with shared decision making.  Labs/ tests ordered today include: Echo, 30 day cardiac monitor.   Phill Myron. West Pugh, ANP, [facility]   10/15/2019 10:15 AM    [address] Office [phone] Fax [phone]  Notice: This dictation was prepared with Dragon dictation along with smaller phrase technology. Any transcriptional errors that result from this process are unintentional and may not be corrected upon review.

## 2019-10-14 DIAGNOSIS — F4310 Post-traumatic stress disorder, unspecified: Secondary | ICD-10-CM | POA: Diagnosis not present

## 2019-10-14 DIAGNOSIS — F312 Bipolar disorder, current episode manic severe with psychotic features: Secondary | ICD-10-CM | POA: Diagnosis not present

## 2019-10-14 DIAGNOSIS — F410 Panic disorder [episodic paroxysmal anxiety] without agoraphobia: Secondary | ICD-10-CM | POA: Diagnosis not present

## 2019-10-15 ENCOUNTER — Ambulatory Visit (INDEPENDENT_AMBULATORY_CARE_PROVIDER_SITE_OTHER): Payer: BC Managed Care – PPO | Admitting: Adult Health

## 2019-10-15 ENCOUNTER — Encounter: Payer: Self-pay | Admitting: Adult Health

## 2019-10-15 ENCOUNTER — Other Ambulatory Visit: Payer: Self-pay

## 2019-10-15 ENCOUNTER — Telehealth: Payer: Self-pay | Admitting: Radiology

## 2019-10-15 VITALS — BP 130/63 | HR 51 | Temp 96.1°F | Resp 96 | Ht 64.0 in | Wt 216.8 lb

## 2019-10-15 DIAGNOSIS — C50919 Malignant neoplasm of unspecified site of unspecified female breast: Secondary | ICD-10-CM | POA: Diagnosis not present

## 2019-10-15 DIAGNOSIS — I519 Heart disease, unspecified: Secondary | ICD-10-CM | POA: Diagnosis not present

## 2019-10-15 DIAGNOSIS — F329 Major depressive disorder, single episode, unspecified: Secondary | ICD-10-CM

## 2019-10-15 DIAGNOSIS — I4891 Unspecified atrial fibrillation: Secondary | ICD-10-CM | POA: Diagnosis not present

## 2019-10-15 DIAGNOSIS — I700 Atherosclerosis of aorta: Secondary | ICD-10-CM

## 2019-10-15 DIAGNOSIS — F32A Depression, unspecified: Secondary | ICD-10-CM

## 2019-10-15 MED ORDER — CEPHALEXIN 500 MG PO CAPS: 500.0000 mg | ORAL_CAPSULE | Freq: Two times a day (BID) | ORAL | 0 refills | Status: DC

## 2019-10-15 MED ORDER — METOPROLOL TARTRATE 50 MG PO TABS: 50.0000 mg | ORAL_TABLET | Freq: Two times a day (BID) | ORAL | 3 refills | Status: DC

## 2019-10-15 NOTE — Telephone Encounter (Signed)
Enrolled patient for a 30 day [employer] Event Monitor to be mailed to patients home  

## 2019-10-15 NOTE — Patient Instructions (Addendum)
Medication Instructions:  START- Keflex(cephalexin) 500 mg by mouth twice a day for 10 days  *If you need a refill on your cardiac medications before your next appointment, please call your pharmacy*   Lab Work: None Ordered   Testing/Procedures: Your physician has requested that you have an echocardiogram. Echocardiography is a painless test that uses sound waves to create images of your heart. It provides your doctor with information about the size and shape of your heart and how well your heart's chambers and valves are working. This procedure takes approximately one hour. There are no restrictions for this procedure.  Your physician has recommended that you wear an event monitor for 30 days. Event monitors are medical devices that record the heart's electrical activity. Doctors most often [place] these monitors to diagnose arrhythmias. Arrhythmias are problems with the speed or rhythm of the heartbeat. The monitor is a small, portable device. You can wear one while you do your normal daily activities. This is usually used to diagnose what is causing palpitations/syncope (passing out).   Follow-Up: At [facility], you and your health needs are our priority.  As part of our continuing mission to provide you with exceptional heart care, we have created designated Provider Care Teams.  These Care Teams include your primary Cardiologist (physician) and Advanced Practice Providers (APPs -  Physician Assistants and Nurse Practitioners) who all work together to provide you with the care you need, when you need it.  We recommend signing up for the patient portal called "MyChart".  Sign up information is provided on this After Visit Summary.  MyChart is used to connect with patients for Virtual Visits (Telemedicine).  Patients are able to view lab/test results, encounter notes, upcoming appointments, etc.  Non-urgent messages can be sent to your provider as well.   To learn more about what you can do  with MyChart, go to [email or web address].    Your next appointment:   2 month(s)  The format for your next appointment:   In Person  Provider:   Minus Breeding, MD

## 2019-10-16 DIAGNOSIS — Z452 Encounter for adjustment and management of vascular access device: Secondary | ICD-10-CM | POA: Diagnosis not present

## 2019-10-16 DIAGNOSIS — Z853 Personal history of malignant neoplasm of breast: Secondary | ICD-10-CM | POA: Diagnosis not present

## 2019-10-21 ENCOUNTER — Inpatient Hospital Stay: Payer: BC Managed Care – PPO | Attending: Oncology | Admitting: Adult Health

## 2019-10-21 ENCOUNTER — Telehealth: Payer: Self-pay

## 2019-10-21 ENCOUNTER — Other Ambulatory Visit: Payer: Self-pay

## 2019-10-21 DIAGNOSIS — C50411 Malignant neoplasm of upper-outer quadrant of right female breast: Secondary | ICD-10-CM

## 2019-10-21 DIAGNOSIS — Z170 Estrogen receptor positive status [ER+]: Secondary | ICD-10-CM

## 2019-10-21 NOTE — Progress Notes (Signed)
Unable to connect with patient.  I called her via my chart video and I could hear talking in the background, but no one responded to me, or was saying hello.  It was as is if they were not aware I was on the other end.  I attempted to call patient twice and this happened.  Will have scheduling call to reschedule patient.    Wilber Bihari, NP

## 2019-10-21 NOTE — Telephone Encounter (Signed)
PROGRESS NOTE: Per Wilber Bihari NP sent schedule message to get patients missed appointment today rescheduled. I let them know that patient would also prefer a face to face visit instead of phone visit.

## 2019-10-24 ENCOUNTER — Ambulatory Visit (INDEPENDENT_AMBULATORY_CARE_PROVIDER_SITE_OTHER): Payer: BC Managed Care – PPO | Admitting: Nurse Practitioner

## 2019-10-24 ENCOUNTER — Other Ambulatory Visit: Payer: Self-pay

## 2019-10-24 ENCOUNTER — Encounter: Payer: Self-pay | Admitting: Nurse Practitioner

## 2019-10-24 VITALS — BP 114/58 | HR 54 | Temp 98.3°F | Resp 20 | Ht 64.0 in | Wt 217.0 lb

## 2019-10-24 DIAGNOSIS — Z0000 Encounter for general adult medical examination without abnormal findings: Secondary | ICD-10-CM | POA: Diagnosis not present

## 2019-10-24 DIAGNOSIS — Z23 Encounter for immunization: Secondary | ICD-10-CM | POA: Diagnosis not present

## 2019-10-24 NOTE — Patient Instructions (Signed)
What are Advance Directives? ?A living will allows you to document your wishes concerning medical treatments at the end of life.  ? ?Before your living will can guide medical decision-making two physicians must certify: ?You are unable to make medical decisions,  ?You are in the medical condition specified in the state's living will law (such as "terminal illness" or "permanent unconsciousness"),  ?Other requirements also may apply, depending upon the state. ?A medical power of attorney (or healthcare proxy) allows you to appoint a person you trust as your healthcare agent (or surrogate decision maker), who is authorized to make medical decisions on your behalf.  ? ?Before a medical power of attorney goes into effect a person?s physician must conclude that they are unable to make their own medical decisions. In addition: ?If a person regains the ability to make decisions, the agent cannot continue to act on the person's behalf.  ?Many states have additional requirements that apply only to decisions about life-sustaining medical treatments.  ?For example, before your agent can refuse a life-sustaining treatment on your behalf, a second physician may have to confirm your doctor's assessment that you are incapable of making treatment decisions. ?What Else Do I Need to Know?  ?Advance directives are legally valid throughout the [place]. While you do not need a lawyer to fill out an advance directive, your advance directive becomes legally valid as soon as you sign them in front of the required witnesses. The laws governing advance directives vary from state to state, so it is important to complete and sign advance directives that comply with your state's law. Also, advance directives can have different titles in different states.  ?Emergency medical technicians cannot honor living wills or medical powers of attorney. Once emergency personnel have been called, they must do what is necessary to stabilize a person  for transfer to a hospital, both from accident sites and from a home or other facility. After a physician fully evaluates the person's condition and determines the underlying conditions, advance directives can be implemented.  ?One state?s advance directive does not always work in another state. Some states do honor advance directives from another state; others will honor out-of-state advance directives as long as they are similar to the state's own law; and some states do not have an answer to this question. The best solution is if you spend a significant amount of time in more than one state, you should complete the advance directives for all the states you spend a significant amount of time in.  ?Advance directives do not expire. An advance directive remains in effect until you change it. If you complete a new advance directive, it invalidates the previous one.  ?You should review your advance directives periodically to ensure that they still reflect your wishes. If you want to change anything in an advance directive once you have completed it, you should complete a whole new document. ?[place] ? ? ? ?[facility], www.nhpco.org ? ?

## 2019-10-24 NOTE — Addendum Note (Signed)
Addended by: Rolena Infante on: 10/24/2019 12:37 PM   Modules accepted: Orders

## 2019-10-24 NOTE — Progress Notes (Signed)
Subjective:    Doris Smith is a 69 y.o. female who presents for a Welcome to Medicare exam.   Review of Systems Review of Systems  Constitutional: Negative for diaphoresis and weight loss.  Eyes: Negative for blurred vision, double vision and pain.  Respiratory: Negative for shortness of breath.   Cardiovascular: Negative for chest pain, palpitations, orthopnea and leg swelling.  Gastrointestinal: Negative for abdominal pain.  Musculoskeletal: Positive for myalgias.  Skin: Negative for rash.  Neurological: Negative for dizziness, sensory change, loss of consciousness, weakness and headaches.  Endo/Heme/Allergies: Negative for polydipsia. Does not bruise/bleed easily.  Psychiatric/Behavioral: Negative for memory loss. The patient does not have insomnia.   All other systems reviewed and are negative.          Objective:    Today's Vitals   10/24/19 1104  BP: (!) 114/58  Pulse: (!) 54  Resp: 20  Temp: 98.3 F (36.8 C)  TempSrc: Temporal  Weight: 217 lb (98.4 kg)  Height: 5\' 4"  (1.626 m)  Body mass index is 37.25 kg/m.  Medications Outpatient Encounter Medications as of 10/24/2019  Medication Sig  . anastrozole (ARIMIDEX) 1 MG tablet Take 1 tablet (1 mg total) by mouth daily.  . benzonatate (TESSALON PERLES) 100 MG capsule Take 1 capsule (100 mg total) by mouth 3 (three) times daily as needed for cough.  . clotrimazole-betamethasone (LOTRISONE) cream APPLY  CREAM TOPICALLY TWICE DAILY  . esomeprazole (NEXIUM) 40 MG capsule Take 1 capsule (40 mg total) by mouth daily.  Marland Kitchen gabapentin (NEURONTIN) 300 MG capsule Take 300 mg by mouth 3 (three) times daily.  Marland Kitchen ibandronate (BONIVA) 150 MG tablet Take 1 tablet (150 mg total) by mouth every 30 (thirty) days. Take in the morning with a full glass of water, on an empty stomach, and do not take anything else by mouth or lie down for the next 30 min.  . lamoTRIgine (LAMICTAL) 100 MG tablet Take 100 mg by mouth in the morning, at  noon, and at bedtime.  Marland Kitchen loratadine (CLARITIN) 10 MG tablet Take 10 mg by mouth daily.  Marland Kitchen LORazepam (ATIVAN) 0.5 MG tablet Take 1 tablet (0.5 mg total) by mouth at bedtime as needed (Nausea or vomiting).  . metoprolol tartrate (LOPRESSOR) 50 MG tablet Take 1 tablet (50 mg total) by mouth 2 (two) times daily.  . promethazine-dextromethorphan (PROMETHAZINE-DM) 6.25-15 MG/5ML syrup TAKE 5 MILLILITERS BY MOUTH 4 TIMES DAILY AS NEEDED FOR COUGH  . Vilazodone HCl (VIIBRYD) 40 MG TABS Take 40 mg by mouth daily.      History: Past Medical History:  Diagnosis Date  . A-fib ([place]) 11/2012   PAF  . Anxiety    takes Ativan  . Asthma 04/07/2011   dx  . Bipolar disorder ([place])   . Cancer [facility])    Right breast  . Depression   . Early cataracts, bilateral   . Fatty liver   . Fibromyalgia   . GERD (gastroesophageal reflux disease)   . Irritable bowel syndrome   . Mental disorder    dx bipolar  . PONV (postoperative nausea and vomiting)   . Sleep apnea 04/2011    does not wear CPAP   Past Surgical History:  Procedure Laterality Date  . ABDOMINAL HYSTERECTOMY    . COLONOSCOPY    . DILATION AND CURETTAGE OF UTERUS  1981   abnormal pap  . KNEE ARTHROSCOPY Left 2007   x 2  . LUMBAR LAMINECTOMY/DECOMPRESSION MICRODISCECTOMY  05/31/2011   Procedure: LUMBAR  LAMINECTOMY/DECOMPRESSION MICRODISCECTOMY;  Surgeon: Johnn Hai;  Location: [facility];  Service: Orthopedics;  Laterality: Left;  Decompression Lumbar four to five and  Lumbar five to Sacral one on Left  (X-Ray)  . MASTECTOMY W/ SENTINEL NODE BIOPSY Right 09/16/2018   Procedure: RIGHT MASTECTOMY WITH RIGHT AXILLARY SENTINEL LYMPH NODE BIOPSY;  Surgeon: Alphonsa Overall, MD;  Location: [facility];  Service: General;  Laterality: Right;  . PARTIAL HYSTERECTOMY  1982  . PORTACATH PLACEMENT Left 10/31/2018   Procedure: INSERTION PORT-A-CATH WITH ULTRASOUND;  Surgeon: Alphonsa Overall, MD;  Location: [facility];  Service: General;  Laterality:  Left;  . TONSILLECTOMY     as child  . TOTAL KNEE ARTHROPLASTY Left 12/18/2005    Family History  Problem Relation Age of Onset  . Anesthesia problems Mother   . Heart disease Mother        CHF, atrial fib  . Heart failure Mother   . Anesthesia problems Sister   . Colon polyps Father   . Heart disease Father        "Fluid around the heart"  . Hypertension Sister   . Breast cancer Maternal Aunt   . Colon cancer Maternal Aunt   . Prostate cancer Neg Hx   . Ovarian cancer Neg Hx    Social History   Occupational History  . Occupation: Disabled  Tobacco Use  . Smoking status: Never Smoker  . Smokeless tobacco: Never Used  Substance and Sexual Activity  . Alcohol use: No  . Drug use: No  . Sexual activity: Yes    Birth control/protection: Post-menopausal, Surgical    Tobacco Counseling Encouraged to avoid cigarette smoke  Immunizations and Health Maintenance Immunization History  Administered Date(s) Administered  . Fluad Quad(high Dose 65+) 04/23/2019  . Influenza, High Dose Seasonal PF 05/03/2018  . Influenza-Unspecified 05/08/2011   Health Maintenance Due  Topic Date Due  . PNA vac Low Risk Adult (1 of 2 - PCV13) Never done  . DEXA SCAN  10/14/2018    Activities of Daily Living Able to self perform all activities of daily living  Physical Exam  Not today(optional), or other factors deemed appropriate based on the beneficiary's medical and social history and current clinical standards.  Advanced Directives: does not have. Will give her information      Assessment:    This is a routine wellness examination for this patient . yes  Vision/Hearing screen No problems noted  Dietary issues and exercise activities discussed:     Goals   Maintain weight    Depression Screen PHQ 2/9 Scores 10/24/2019 05/15/2019 12/25/2018 07/11/2018  PHQ - 2 Score 0 0 0 6  PHQ- 9 Score - - - 23  Not completed - - - -     Fall Risk Fall Risk  10/24/2019  Falls in the past  year? 0  Number falls in past yr: -  Injury with Fall? -  Comment -  Risk Factor Category  -  Risk for fall due to : -  Follow up -    Cognitive Function:      MMSE - Mini Mental State Exam 10/24/2019  Orientation to time 5  Orientation to Place 5  Registration 3  Attention/ Calculation 5  Recall 3  Language- name 2 objects 2  Language- repeat 1  Language- follow 3 step command 3  Language- read & follow direction 1  Write a sentence 1  Copy design 1  Total score 30  Patient Care Team: Chevis Pretty, FNP as PCP - General (Nurse Practitioner) Lendon Colonel, NP as PCP - Cardiology (Nurse Practitioner) Alphonsa Overall, MD as Consulting Physician (General Surgery) Magrinat, Virgie Dad, MD as Consulting Physician (Oncology) Kyung Rudd, MD as Consulting Physician (Radiation Oncology) Elijio Miles, MD as Consulting Physician (Pulmonary Disease) Netta Cedars, MD as Consulting Physician (Orthopedic Surgery) Susa Day, MD as Consulting Physician (Orthopedic Surgery) Janeth Rase, NP as Nurse Practitioner (Adult Health Nurse Practitioner) Mauro Kaufmann, RN as Oncology Nurse Navigator Rockwell Germany, RN as Oncology Nurse Navigator Irene Limbo, MD as Consulting Physician (Plastic Surgery)     Plan:    welcome to medicare wellness exam  I have personally reviewed and noted the following in the patient's chart:   . Medical and social history . Use of alcohol, tobacco or illicit drugs  . Current medications and supplements . Functional ability and status . Nutritional status . Physical activity . Advanced directives . List of other physicians . Hospitalizations, surgeries, and ER visits in previous 12 months . Vitals . Screenings to include cognitive, depression, and falls . Referrals and appointments  In addition, I have reviewed and discussed with patient certain preventive protocols, quality metrics, and best practice  recommendations. A written personalized care plan for preventive services as well as general preventive health recommendations were provided to patient.     [place], [place] 10/24/2019

## 2019-10-28 ENCOUNTER — Encounter (INDEPENDENT_AMBULATORY_CARE_PROVIDER_SITE_OTHER): Payer: BC Managed Care – PPO

## 2019-10-28 DIAGNOSIS — I491 Atrial premature depolarization: Secondary | ICD-10-CM | POA: Diagnosis not present

## 2019-10-28 DIAGNOSIS — C50919 Malignant neoplasm of unspecified site of unspecified female breast: Secondary | ICD-10-CM

## 2019-10-28 DIAGNOSIS — R001 Bradycardia, unspecified: Secondary | ICD-10-CM | POA: Diagnosis not present

## 2019-10-28 DIAGNOSIS — I480 Paroxysmal atrial fibrillation: Secondary | ICD-10-CM

## 2019-10-30 ENCOUNTER — Other Ambulatory Visit (HOSPITAL_COMMUNITY): Payer: BC Managed Care – PPO

## 2019-11-09 NOTE — Progress Notes (Signed)
[facility]  [phone] [phone]     ID: Doris Smith DOB: 1950/08/18  MR#: [id number]  TSV#:[id number]  Patient Care Team: Chevis Pretty, FNP as PCP - General (Nurse Practitioner) Lendon Colonel, NP as PCP - Cardiology (Nurse Practitioner) Alphonsa Overall, MD as Consulting Physician (General Surgery) Decarla Siemen, Virgie Dad, MD as Consulting Physician (Oncology) Kyung Rudd, MD as Consulting Physician (Radiation Oncology) Elijio Miles, MD as Consulting Physician (Pulmonary Disease) Netta Cedars, MD as Consulting Physician (Orthopedic Surgery) Susa Day, MD as Consulting Physician (Orthopedic Surgery) Janeth Rase, NP as Nurse Practitioner (Adult Health Nurse Practitioner) Mauro Kaufmann, RN as Oncology Nurse Navigator Rockwell Germany, RN as Oncology Nurse Navigator Irene Limbo, MD as Consulting Physician (Plastic Surgery) Chauncey Cruel, MD OTHER MD: Chapman Moss, psychiatry   CHIEF COMPLAINT: Estrogen receptor positive breast cancer (s/p right mastectomy)  CURRENT TREATMENT: anastrozole; ibandronate   INTERVAL HISTORY: Doris Smith returns today for follow-up of her estrogen receptor positive breast cancer.   She restarted on anastrozole at her last visit on 04/23/2019.  She is tolerating this generally well.    Her most recent bone density screening on 10/13/2016 showed a T-score of -2.1, which is considered osteopenic.  Since her last visit, she was seen by Sandi Mealy, PA-C for shoulder/chest pain. Plain films performed that day were benign.   REVIEW OF SYSTEMS: Doris Smith is tolerating the anastrozole well.  She does have hot flashes "all the time".  She is taking gabapentin for other reasons and this is helping a little bit.  She is being evaluated for atrial fibrillation and has a monitor in place.  She is currently not exercising.  She hates to have to call in to report what ever activity she is engaged in so  it can be correlated with her monitor.  She tells me she hates the way her body looks, and does not want to show it to her husband.  They have been married more than 50 years.  She agrees that he does not look the way he looked when he was 22 but she accepts them she says.  She has evaluated various options for reconstruction and decided against all of them.  A detailed review of systems today was otherwise stable.   HISTORY OF CURRENT ILLNESS: From the original intake note:  "Doris Smith" presented to her PCP, Dr. Hassell Done, with right breast pain, fullness, and nipple inversion since November 2019. She proceeded to undergo bilateral diagnostic mammography with tomography and right breast ultrasonography at [facility] on 07/31/2018 showing: findings compatible with multicentric breast cancer spanning at least the upper inner and upper outer quadrants of the right breast; normal right axilla. Palpable firmness of approximately 1.5 cm was noted in the 9 o'clock position, which was also seen on ultrasound with indisctinct margins measuring 2.7 x 2.3 x 2.2 cm. At 1 o'clock, a mass with poorly defined margins measures 1.9 x 1 x 0.8 cm. Additional poorly defined masses were seen in the 12 o'clock retroareolar region.  Accordingly on 08/02/2018 she proceeded to biopsy of the right breast area in question. The pathology ([id number]) from this procedure showed: invasive mammary carcinoma at 9 o'clock and 1 o'clock; e-cadherin negative, consistent with lobular phenotype; grade 2-3. Prognostic indicators significant for: estrogen receptor, 90% positive and progesterone receptor, 1% positive, both with strong staining intensity. Proliferation marker Ki67 at 1%. HER2 equivocal by immunohistochemistry, 2+ but negative by fluorescent in situ hybridization with a signals ratio 1.04 and number  per cell 1.2.   The patient's subsequent history is as detailed below.   PAST MEDICAL HISTORY: Past Medical History:  Diagnosis  Date  . A-fib ([place]) 11/2012   PAF  . Anxiety    takes Ativan  . Asthma 04/07/2011   dx  . Bipolar disorder ([place])   . Cancer [facility])    Right breast  . Depression   . Early cataracts, bilateral   . Fatty liver   . Fibromyalgia   . GERD (gastroesophageal reflux disease)   . Irritable bowel syndrome   . Mental disorder    dx bipolar  . PONV (postoperative nausea and vomiting)   . Sleep apnea 04/2011    does not wear CPAP  She has chronic sinus headaches, hiatal hernia   PAST SURGICAL HISTORY: Past Surgical History:  Procedure Laterality Date  . ABDOMINAL HYSTERECTOMY    . COLONOSCOPY    . DILATION AND CURETTAGE OF UTERUS  1981   abnormal pap  . KNEE ARTHROSCOPY Left 2007   x 2  . LUMBAR LAMINECTOMY/DECOMPRESSION MICRODISCECTOMY  05/31/2011   Procedure: LUMBAR LAMINECTOMY/DECOMPRESSION MICRODISCECTOMY;  Surgeon: Johnn Hai;  Location: [facility];  Service: Orthopedics;  Laterality: Left;  Decompression Lumbar four to five and  Lumbar five to Sacral one on Left  (X-Ray)  . MASTECTOMY W/ SENTINEL NODE BIOPSY Right 09/16/2018   Procedure: RIGHT MASTECTOMY WITH RIGHT AXILLARY SENTINEL LYMPH NODE BIOPSY;  Surgeon: Alphonsa Overall, MD;  Location: [facility];  Service: General;  Laterality: Right;  . PARTIAL HYSTERECTOMY  1982  . PORTACATH PLACEMENT Left 10/31/2018   Procedure: INSERTION PORT-A-CATH WITH ULTRASOUND;  Surgeon: Alphonsa Overall, MD;  Location: [facility];  Service: General;  Laterality: Left;  . TONSILLECTOMY     as child  . TOTAL KNEE ARTHROPLASTY Left 12/18/2005    FAMILY HISTORY Family History  Problem Relation Age of Onset  . Anesthesia problems Mother   . Heart disease Mother        CHF, atrial fib  . Heart failure Mother   . Anesthesia problems Sister   . Colon polyps Father   . Heart disease Father        "Fluid around the heart"  . Hypertension Sister   . Breast cancer Maternal Aunt   . Colon cancer Maternal Aunt   . Prostate cancer Neg Hx   .  Ovarian cancer Neg Hx    As of February 2019, patient father is alive at 83 years old. Patient mother is alive at 39 years old. She notes a family hx of breast cancer. A maternal aunt was diagnosed with breast cancer, but Doris Smith is unsure if it was in one or both breasts. She has 3 siblings, 3 sisters and 0 brothers.   GYNECOLOGIC HISTORY:  No LMP recorded. Patient is postmenopausal. Menarche: 69 years old Age at first live birth: 69 years old [place] P 2 LMP about 42 years ago Contraceptive no HRT no  Hysterectomy? partial So? no   SOCIAL HISTORY: Doris Smith worked in [employer] for 15 years. She is on disability because her back, knees, and nerves. Her husband, [name], has been at [employer] 43 years as a Freight forwarder.  Even though their home is in [place] they are actually living in [place] in an apartment while her husband works there.  Son Legrand Como, age 5, works as a Chief Strategy Officer in [place], [place]. Son Shanon Brow, age 83, works as a Building control surveyor in [place], [place]. They have 4 grandchildren, 2 great-grandchildren  with 2 more on the way. She attends a [employer].   ADVANCED DIRECTIVES: In the absence of any documents to the contrary the patient's husband is her healthcare power of attorney   HEALTH MAINTENANCE: Social History   Tobacco Use  . Smoking status: Never Smoker  . Smokeless tobacco: Never Used  Substance Use Topics  . Alcohol use: No  . Drug use: No     Colonoscopy: 2019  PAP: 11/03/2014, normal  Bone density: 10/13/2016, T-score of -2.1   Allergies  Allergen Reactions  . Iohexol     "over 20 years ago" had reaction "hurting in arm and heart"-Done in [place], [place].Marland Kitchenphysician stopped CT at that time.  . Codeine Other (See Comments)    Makes feel like she is wild   . Prednisone Other (See Comments)    Makes me very irritable  . Sulfonamide Derivatives Other (See Comments)    Upset stomach  . Celecoxib Nausea And Vomiting  . Sulfa Antibiotics Nausea And Vomiting  . Vitamin B12  Rash    Current Outpatient Medications  Medication Sig Dispense Refill  . anastrozole (ARIMIDEX) 1 MG tablet Take 1 tablet (1 mg total) by mouth daily. 90 tablet 3  . benzonatate (TESSALON PERLES) 100 MG capsule Take 1 capsule (100 mg total) by mouth 3 (three) times daily as needed for cough. 20 capsule 0  . clotrimazole-betamethasone (LOTRISONE) cream APPLY  CREAM TOPICALLY TWICE DAILY 45 g 0  . esomeprazole (NEXIUM) 40 MG capsule Take 1 capsule (40 mg total) by mouth daily. 90 capsule 1  . gabapentin (NEURONTIN) 300 MG capsule Take 300 mg by mouth 3 (three) times daily.    Marland Kitchen ibandronate (BONIVA) 150 MG tablet Take 1 tablet (150 mg total) by mouth every 30 (thirty) days. Take in the morning with a full glass of water, on an empty stomach, and do not take anything else by mouth or lie down for the next 30 min. 3 tablet 4  . lamoTRIgine (LAMICTAL) 100 MG tablet Take 100 mg by mouth in the morning, at noon, and at bedtime.    Marland Kitchen loratadine (CLARITIN) 10 MG tablet Take 10 mg by mouth daily.    Marland Kitchen LORazepam (ATIVAN) 0.5 MG tablet Take 1 tablet (0.5 mg total) by mouth at bedtime as needed (Nausea or vomiting). 30 tablet 0  . metoprolol tartrate (LOPRESSOR) 50 MG tablet Take 1 tablet (50 mg total) by mouth 2 (two) times daily. 180 tablet 3  . promethazine-dextromethorphan (PROMETHAZINE-DM) 6.25-15 MG/5ML syrup TAKE 5 MILLILITERS BY MOUTH 4 TIMES DAILY AS NEEDED FOR COUGH    . Vilazodone HCl (VIIBRYD) 40 MG TABS Take 40 mg by mouth daily.     No current facility-administered medications for this visit.    OBJECTIVE: white woman who appears stated age  49:   11/10/19 1334  Pulse: 64  Resp: 18  Temp: 98.7 F (37.1 C)  SpO2: 100%   Wt Readings from Last 3 Encounters:  11/10/19 216 lb 12.8 oz (98.3 kg)  10/24/19 217 lb (98.4 kg)  10/15/19 216 lb 12.8 oz (98.3 kg)   Body mass index is 37.21 kg/m.    ECOG FS:1 - Symptomatic but completely ambulatory  Abundant curly salt-and-pepper  hair Sclerae unicteric, EOMs intact Wearing a mask No cervical or supraclavicular adenopathy Lungs no rales or rhonchi Heart regular rate and rhythm Abd soft, nontender, positive bowel sounds MSK no focal spinal tenderness, no upper extremity lymphedema Neuro: nonfocal, well oriented, appropriate affect Breasts: The right  breast is status post mastectomy.  There is no evidence of chest wall recurrence.  The left breast is unremarkable.  Both axillae are benign.   LAB RESULTS:  CMP     Component Value Date/Time   NA 142 07/23/2019 1155   NA 141 05/15/2019 1252   K 4.2 07/23/2019 1155   CL 105 07/23/2019 1155   CO2 26 07/23/2019 1155   GLUCOSE 86 07/23/2019 1155   BUN 15 07/23/2019 1155   BUN 11 05/15/2019 1252   CREATININE 0.88 07/23/2019 1155   CREATININE 0.90 12/19/2018 1434   CALCIUM 8.6 (L) 07/23/2019 1155   PROT 6.8 07/23/2019 1155   PROT 6.3 05/15/2019 1252   ALBUMIN 3.7 07/23/2019 1155   ALBUMIN 3.9 05/15/2019 1252   AST 14 (L) 07/23/2019 1155   AST 14 (L) 08/14/2018 0758   ALT 9 07/23/2019 1155   ALT 10 08/14/2018 0758   ALKPHOS 106 07/23/2019 1155   BILITOT 0.4 07/23/2019 1155   BILITOT 0.3 05/15/2019 1252   BILITOT 0.5 08/14/2018 0758   GFRNONAA >60 07/23/2019 1155   GFRNONAA >60 12/19/2018 1434   GFRAA >60 07/23/2019 1155   GFRAA >60 12/19/2018 1434    No results found for: TOTALPROTELP, ALBUMINELP, A1GS, A2GS, BETS, BETA2SER, GAMS, MSPIKE, SPEI  No results found for: KPAFRELGTCHN, LAMBDASER, KAPLAMBRATIO  Lab Results  Component Value Date   WBC 4.8 07/23/2019   NEUTROABS 3.3 07/23/2019   HGB 11.8 (L) 07/23/2019   HCT 36.7 07/23/2019   MCV 88.4 07/23/2019   PLT 249 07/23/2019   No results found for: LABCA2  No components found for: MLJQGB201  No results for input(s): INR in the last 168 hours.  No results found for: LABCA2  No results found for: EOF121  No results found for: FXJ883  No results found for: GPQ982  No results found for:  CA2729  No components found for: HGQUANT  No results found for: CEA1 / No results found for: CEA1   No results found for: AFPTUMOR  No results found for: CHROMOGRNA  No results found for: HGBA, HGBA2QUANT, HGBFQUANT, HGBSQUAN (Hemoglobinopathy evaluation)   No results found for: LDH  No results found for: IRON, TIBC, IRONPCTSAT (Iron and TIBC)  No results found for: FERRITIN  Urinalysis    Component Value Date/Time   COLORURINE YELLOW 12/19/2018 1355   APPEARANCEUR HAZY (A) 12/19/2018 1355   APPEARANCEUR Clear 05/03/2018 1204   LABSPEC 1.016 12/19/2018 1355   PHURINE 5.0 12/19/2018 [place] 12/19/2018 [place] 12/19/2018 [place] 12/19/2018 1355   BILIRUBINUR Negative 05/03/2018 1204   KETONESUR NEGATIVE 12/19/2018 1355   PROTEINUR NEGATIVE 12/19/2018 1355   UROBILINOGEN negative 11/03/2014 1039   UROBILINOGEN 0.2 05/25/2011 0923   NITRITE NEGATIVE 12/19/2018 1355   LEUKOCYTESUR SMALL (A) 12/19/2018 1355    STUDIES: No results found.   ELIGIBLE FOR AVAILABLE RESEARCH PROTOCOL: no   ASSESSMENT: 69 y.o. [place], [place] woman status post right breast biopsy 08/02/2018 for a clinical T3 N0, stage IIA invasive lobular carcinoma, grade 2, estrogen receptor strongly positive, progesterone receptor 1% positive, with no HER-2 amplification and an MIB-1 of 1%.  (a) CT scan of the head and chest, without contrast 08/23/2018 showed nonspecific 0.4 cm left lower lobe pulmonary nodule, no definitive metastatic disease  (b) bone scan 08/23/2018 shows multiple spinal areas of abnormal uptake, but  (c) total spinal MRI 09/03/2018 finds bone scan findings to be secondary to degenerative disease, no evidence  of metastatic disease.  (1) status post right mastectomy and sentinel lymph node sampling 09/16/2018 showing a pT3 pN1(mic), stage IIA-IIIA invasive lobular breast cancer, grade 2, with 1 of 5 sampled lymph nodes involved by  micrometastatic deposit, with extra nodular extension; ample margins  (2)  MammaPrint "high risk" suggests a 5-year metastasis free survival of 93% with chemotherapy, with an absolute chemotherapy benefit in the greater than 12% range  (3) adjuvant radiation 12/31/2018-02/19/2019:  The right chest wall and regional nodes were treated to 50.4 Gy in 28 fractions followed by a 10 Gy boost over 5 fractions.   (4) anastrozole started neoadjuvantly (on 08/14/2018), discontinued 10/14/2018 in preparation for chemotherapy, resumed October 2020  (a) bone density 10/13/2016 showed osteopenia with a lowest score at -2.1  (b) bone density JAN 2021  (c) ibandronate/Boniva started January 2021  (5) [facility] testing on mastectomy sample (09/16/2018) showed stable MSI and proficient mismatch repair status, with a low mutational burden; BRCA 1 and 2 were not mutated, PI K3 was not mutated, ER B B2 was not mutated, and a KT 1 was not mutated.  The androgen receptor was positive (90% at 2+) and there was a pathogenic PTEN variant an excellent 3 (c.209+1G>A)  (6) adjuvant chemotherapy consisting of cyclophosphamide, methotrexate, fluorouracil (CMF) started 11/05/2018, repeated every 21 days x 8, last dose 03/02/2019  (a) methotrexate was omitted during concurrent radiation (cycles 4, 5 and 6)   PLAN: Doris Smith is a little over a year out from definitive surgery for her breast cancer with no evidence of disease recurrence.  This is very favorable.  She is tolerating the anastrozole and ibandronate well and the plan is to continue the anastrozole a minimum of 5 years.  She was supposed to have had a bone density in January but that was not done.  I will added to her left breast mammogram which will be in November.  I have encouraged her to exercise as much as she can tolerate.  Right now she does not want to exercise because she has to make a phone call and report what ever activity she engages in because of her  monitor.  We discussed self image issues.  It is very difficult for her to look at herself in the mirror.  I encouraged her to do that on a frequent basis and get to know her scar and and just learn to feel comfortable with her body.  We again discussed reconstruction and she has absolutely rule that out.  I did write her a prescription for prostheses today  She will return to see me in November.  She knows to call for any other issue that may develop before then  Total encounter time 30 minutes.Sarajane Jews C. Suzanne Garbers, MD  11/10/19 2:07 PM Medical Oncology and Hematology [facility] [place], [place] [address] Tel. [phone]    Fax. [phone]   I, Wilburn Mylar, am acting as scribe for Dr. Virgie Dad. Placido Hangartner.  I, Lurline Del MD, have reviewed the above documentation for accuracy and completeness, and I agree with the above.   *Total Encounter Time as defined by the Centers for Medicare and Medicaid Services includes, in addition to the face-to-face time of a patient visit (documented in the note above) non-face-to-face time: obtaining and reviewing outside history, ordering and reviewing medications, tests or procedures, care coordination (communications with other health care professionals or caregivers) and documentation in the medical record.

## 2019-11-10 ENCOUNTER — Inpatient Hospital Stay: Payer: BC Managed Care – PPO | Attending: Oncology | Admitting: Oncology

## 2019-11-10 ENCOUNTER — Other Ambulatory Visit: Payer: Self-pay

## 2019-11-10 VITALS — HR 64 | Temp 98.7°F | Resp 18 | Ht 64.0 in | Wt 216.8 lb

## 2019-11-10 DIAGNOSIS — R911 Solitary pulmonary nodule: Secondary | ICD-10-CM | POA: Diagnosis not present

## 2019-11-10 DIAGNOSIS — C50211 Malignant neoplasm of upper-inner quadrant of right female breast: Secondary | ICD-10-CM | POA: Diagnosis not present

## 2019-11-10 DIAGNOSIS — Z79899 Other long term (current) drug therapy: Secondary | ICD-10-CM | POA: Insufficient documentation

## 2019-11-10 DIAGNOSIS — Z9011 Acquired absence of right breast and nipple: Secondary | ICD-10-CM | POA: Diagnosis not present

## 2019-11-10 DIAGNOSIS — F3160 Bipolar disorder, current episode mixed, unspecified: Secondary | ICD-10-CM

## 2019-11-10 DIAGNOSIS — Z170 Estrogen receptor positive status [ER+]: Secondary | ICD-10-CM | POA: Diagnosis not present

## 2019-11-10 DIAGNOSIS — I10 Essential (primary) hypertension: Secondary | ICD-10-CM | POA: Diagnosis not present

## 2019-11-10 DIAGNOSIS — Z79811 Long term (current) use of aromatase inhibitors: Secondary | ICD-10-CM | POA: Insufficient documentation

## 2019-11-10 DIAGNOSIS — C50411 Malignant neoplasm of upper-outer quadrant of right female breast: Secondary | ICD-10-CM | POA: Insufficient documentation

## 2019-11-11 DIAGNOSIS — F312 Bipolar disorder, current episode manic severe with psychotic features: Secondary | ICD-10-CM | POA: Diagnosis not present

## 2019-11-11 DIAGNOSIS — F4310 Post-traumatic stress disorder, unspecified: Secondary | ICD-10-CM | POA: Diagnosis not present

## 2019-11-11 DIAGNOSIS — F410 Panic disorder [episodic paroxysmal anxiety] without agoraphobia: Secondary | ICD-10-CM | POA: Diagnosis not present

## 2019-11-12 ENCOUNTER — Telehealth: Payer: Self-pay | Admitting: Cardiology

## 2019-11-12 NOTE — Telephone Encounter (Signed)
The patient has been made aware and verbalized her understanding.  

## 2019-11-12 NOTE — Telephone Encounter (Signed)
Patient calling to see if the heart monitor she is wearing is sending information. She would also like to know if they have enough information or does she need to continue to wear it untill the 12/01/2019.

## 2019-11-12 NOTE — Telephone Encounter (Signed)
OK to take off and send back

## 2019-11-12 NOTE — Telephone Encounter (Signed)
Returned the call to the patient. She stated that she wanted to know if she could go ahead and take the monitor off. It was placed on 10/28/19.  She stated that she is on Metoprolol 50 mg bid and has not felt the fluttering as much as she was prior to the Metoprolol.   She became tearful on the phone and  stated that she felt tired and fatigued all the time and "needed a plan" for moving forward.   She has an echo on 5/25 and follow up with Dr. Percival Spanish on 6/17.

## 2019-11-13 ENCOUNTER — Telehealth: Payer: Self-pay | Admitting: *Deleted

## 2019-11-13 ENCOUNTER — Telehealth: Payer: Self-pay | Admitting: Oncology

## 2019-11-13 NOTE — Telephone Encounter (Signed)
Scheduled appts per 5/3 los. Pt confirmed appt date and time.

## 2019-11-13 NOTE — Telephone Encounter (Signed)
Pt called & states she has an inj appt scheduled but if it is for her bones, insurance won't cover.  Reviewed chart & no inj order seen.  Pt is on Boniva for her bones.  Cancelled inj appt & informed pt.

## 2019-11-14 ENCOUNTER — Other Ambulatory Visit: Payer: Self-pay | Admitting: Adult Health

## 2019-11-14 ENCOUNTER — Ambulatory Visit: Payer: Self-pay | Admitting: Nurse Practitioner

## 2019-11-14 DIAGNOSIS — I480 Paroxysmal atrial fibrillation: Secondary | ICD-10-CM

## 2019-11-14 DIAGNOSIS — C50919 Malignant neoplasm of unspecified site of unspecified female breast: Secondary | ICD-10-CM

## 2019-12-02 ENCOUNTER — Ambulatory Visit (HOSPITAL_COMMUNITY): Payer: BC Managed Care – PPO | Attending: Cardiology

## 2019-12-02 ENCOUNTER — Other Ambulatory Visit: Payer: Self-pay

## 2019-12-02 DIAGNOSIS — C50919 Malignant neoplasm of unspecified site of unspecified female breast: Secondary | ICD-10-CM | POA: Diagnosis not present

## 2019-12-02 DIAGNOSIS — I071 Rheumatic tricuspid insufficiency: Secondary | ICD-10-CM | POA: Insufficient documentation

## 2019-12-02 DIAGNOSIS — I519 Heart disease, unspecified: Secondary | ICD-10-CM

## 2019-12-02 DIAGNOSIS — I4891 Unspecified atrial fibrillation: Secondary | ICD-10-CM | POA: Insufficient documentation

## 2019-12-02 DIAGNOSIS — G4730 Sleep apnea, unspecified: Secondary | ICD-10-CM | POA: Insufficient documentation

## 2019-12-16 DIAGNOSIS — F4310 Post-traumatic stress disorder, unspecified: Secondary | ICD-10-CM | POA: Diagnosis not present

## 2019-12-16 DIAGNOSIS — F312 Bipolar disorder, current episode manic severe with psychotic features: Secondary | ICD-10-CM | POA: Diagnosis not present

## 2019-12-16 DIAGNOSIS — F410 Panic disorder [episodic paroxysmal anxiety] without agoraphobia: Secondary | ICD-10-CM | POA: Diagnosis not present

## 2019-12-22 DIAGNOSIS — C50911 Malignant neoplasm of unspecified site of right female breast: Secondary | ICD-10-CM | POA: Diagnosis not present

## 2019-12-24 NOTE — Progress Notes (Signed)
Cardiology Office Note   Date:  12/25/2019   ID:  Doris Smith, DOB [birth year], MRN [id number]  PCP:  Chevis Pretty, FNP  Cardiologist:   Jory Sims, NP   Chief Complaint  Patient presents with  . Dizziness      History of Present Illness: Doris Smith is a 69 y.o. female who presents for ongoing assessment and management of palpitations, prior history of atrial fib. She was in the [facility] ED in March of this year with atrial fib with RVR.  She has not felt any of the tacky arrhythmias were clearly her atrial fib.  However, she does not feel well.  She feels dizzy when she stands up or when she walks even about 100 yards.  She has to use her cane because of back and knee problems.  She tries to be walking but things are spinning when she is even a short distance.  She is not had any frank syncope.  She is not had any chest pressure, neck or arm discomfort.  When she has severe exercise symptoms she is not taking a pulse oximeter reading and so we do not know what her heart rate is her oxygen saturation.  Of note in May she had an echo which demonstrated no significant abnormalities.  An event monitor demonstrated no arrhythmias.     Past Medical History:  Diagnosis Date  . A-fib ([place]) 11/2012   PAF  . Anxiety    takes Ativan  . Asthma 04/07/2011   dx  . Bipolar disorder ([place])   . Cancer [facility])    Right breast  . Depression   . Early cataracts, bilateral   . Fatty liver   . Fibromyalgia   . GERD (gastroesophageal reflux disease)   . Irritable bowel syndrome   . Mental disorder    dx bipolar  . PONV (postoperative nausea and vomiting)   . Sleep apnea 04/2011    does not wear CPAP    Past Surgical History:  Procedure Laterality Date  . ABDOMINAL HYSTERECTOMY    . COLONOSCOPY    . DILATION AND CURETTAGE OF UTERUS  1981   abnormal pap  . KNEE ARTHROSCOPY Left 2007   x 2  . LUMBAR LAMINECTOMY/DECOMPRESSION MICRODISCECTOMY  05/31/2011    Procedure: LUMBAR LAMINECTOMY/DECOMPRESSION MICRODISCECTOMY;  Surgeon: Johnn Hai;  Location: [facility];  Service: Orthopedics;  Laterality: Left;  Decompression Lumbar four to five and  Lumbar five to Sacral one on Left  (X-Ray)  . MASTECTOMY W/ SENTINEL NODE BIOPSY Right 09/16/2018   Procedure: RIGHT MASTECTOMY WITH RIGHT AXILLARY SENTINEL LYMPH NODE BIOPSY;  Surgeon: Alphonsa Overall, MD;  Location: [place];  Service: General;  Laterality: Right;  . PARTIAL HYSTERECTOMY  1982  . PORTACATH PLACEMENT Left 10/31/2018   Procedure: INSERTION PORT-A-CATH WITH ULTRASOUND;  Surgeon: Alphonsa Overall, MD;  Location: [place];  Service: General;  Laterality: Left;  . TONSILLECTOMY     as child  . TOTAL KNEE ARTHROPLASTY Left 12/18/2005     Current Outpatient Medications  Medication Sig Dispense Refill  . anastrozole (ARIMIDEX) 1 MG tablet Take 1 tablet (1 mg total) by mouth daily. 90 tablet 3  . benzonatate (TESSALON PERLES) 100 MG capsule Take 1 capsule (100 mg total) by mouth 3 (three) times daily as needed for cough. 20 capsule 0  . clotrimazole-betamethasone (LOTRISONE) cream APPLY  CREAM TOPICALLY TWICE DAILY 45 g 0  . esomeprazole (NEXIUM) 40 MG capsule Take 1 capsule (  40 mg total) by mouth daily. 90 capsule 1  . gabapentin (NEURONTIN) 300 MG capsule Take 300 mg by mouth 3 (three) times daily.    Marland Kitchen ibandronate (BONIVA) 150 MG tablet Take 1 tablet (150 mg total) by mouth every 30 (thirty) days. Take in the morning with a full glass of water, on an empty stomach, and do not take anything else by mouth or lie down for the next 30 min. 3 tablet 4  . lamoTRIgine (LAMICTAL) 100 MG tablet Take 100 mg by mouth in the morning, at noon, and at bedtime.    Marland Kitchen loratadine (CLARITIN) 10 MG tablet Take 10 mg by mouth daily.    Marland Kitchen LORazepam (ATIVAN) 0.5 MG tablet Take 1 tablet (0.5 mg total) by mouth at bedtime as needed (Nausea or vomiting). 30 tablet 0  . metoprolol tartrate (LOPRESSOR) 50 MG tablet  Take 50 mg by mouth 2 (two) times daily.     . promethazine-dextromethorphan (PROMETHAZINE-DM) 6.25-15 MG/5ML syrup TAKE 5 MILLILITERS BY MOUTH 4 TIMES DAILY AS NEEDED FOR COUGH    . Vilazodone HCl (VIIBRYD) 40 MG TABS Take 40 mg by mouth daily.    Marland Kitchen apixaban (ELIQUIS) 5 MG TABS tablet Take 1 tablet (5 mg total) by mouth 2 (two) times daily. 60 tablet 11  . midodrine (PROAMATINE) 2.5 MG tablet Take 1 tablet (2.5 mg total) by mouth 3 (three) times daily with meals. 270 tablet 3   No current facility-administered medications for this visit.    Allergies:   Iohexol, Codeine, Palonosetron, Prednisone, Sulfonamide derivatives, Celecoxib, Sulfa antibiotics, and Vitamin b12    ROS:  Please see the history of present illness.   Othzerwise, review of systems are positive for none.   All other systems are reviewed and negative.    PHYSICAL EXAM: VS:  BP 124/76   Pulse 65   Temp (!) 96.4 F (35.8 C)   Ht 5\' 4"  (1.626 m)   Wt 220 lb (99.8 kg)   SpO2 98%   BMI 37.76 kg/m  , BMI Body mass index is 37.76 kg/m. GENERAL:  Well appearing NECK:  No jugular venous distention, waveform within normal limits, carotid upstroke brisk and symmetric, no bruits, no thyromegaly LUNGS:  Clear to auscultation bilaterally CHEST:  Unremarkable HEART:  PMI not displaced or sustained,S1 and S2 within normal limits, no S3, no S4, no clicks, no rubs, no murmurs ABD:  Flat, positive bowel sounds normal in frequency in pitch, no bruits, no rebound, no guarding, no midline pulsatile mass, no hepatomegaly, no splenomegaly EXT:  2 plus pulses throughout, no edema, no cyanosis no clubbing    EKG:  EKG is ordered today. The ekg ordered today demonstrates sinus bradycardia, rate 49, axis within normal limits, intervals within normal limits, no acute ST-T wave changes.   Recent Labs: 07/23/2019: ALT 9; BUN 15; Creatinine, Ser 0.88; Hemoglobin 11.8; Platelets 249; Potassium 4.2; Sodium 142 09/05/2019: BNP 107.1; TSH 1.520      Lipid Panel    Component Value Date/Time   CHOL 177 05/15/2019 1252   TRIG 116 05/15/2019 1252   TRIG 114 11/03/2014 1119   HDL 76 05/15/2019 1252   HDL 82 11/03/2014 1119   CHOLHDL 2.3 05/15/2019 1252   LDLCALC 81 05/15/2019 1252   LDLCALC 94 10/01/2013 0926      Wt Readings from Last 3 Encounters:  12/25/19 220 lb (99.8 kg)  11/10/19 216 lb 12.8 oz (98.3 kg)  10/24/19 217 lb (98.4 kg)  Other studies Reviewed: Additional studies/ records that were reviewed today include: None. Review of the above records demonstrates:  Please see elsewhere in the note.     ASSESSMENT AND PLAN:  ATRIAL FIB:  Ms. Doris Smith has a CHA2DS2 - VASc score of 2.   She really wants to stay on the beta-blocker because she has more palpitations she knows that she is not taking it.  She does have any orthostatic changes and a low heart rate primary try to balance this with the meds as below.  I might have to back off on the beta-blocker eventually.  We had a discussion about blood thinners and with her risk she needs to be on Eliquis 5 mg twice daily.  I will stop her aspirin.  Her kidney function is normal.  She was mildly anemic and I will follow this in 2 weeks with a CBC.  She has no active bleeding issues or contraindications.  She understands the risk benefits of this discussion.  DIZZINESS: She was significantly orthostatic in the office today.  Again this is probably partially related to the beta-blocker but she really wants to continue this.  I am to try low-dose midodrine increasing as needed.  She does hydrate.  She does not like salt.  She will consider compression stockings.  COVID EDUCATION:  She has had her vaccine.    Current medicines are reviewed at length with the patient today.  The patient does not have concerns regarding medicines.  The following changes have been made:  no change  Labs/ tests ordered today include:   Orders Placed This Encounter  Procedures  .  CBC  . EKG 12-Lead     Disposition:   FU with me in one month    Signed, Minus Breeding, MD  12/25/2019 10:57 AM    [place]

## 2019-12-25 ENCOUNTER — Other Ambulatory Visit: Payer: Self-pay

## 2019-12-25 ENCOUNTER — Encounter: Payer: Self-pay | Admitting: Cardiology

## 2019-12-25 ENCOUNTER — Ambulatory Visit (INDEPENDENT_AMBULATORY_CARE_PROVIDER_SITE_OTHER): Payer: BC Managed Care – PPO | Admitting: Cardiology

## 2019-12-25 VITALS — BP 124/76 | HR 65 | Temp 96.4°F | Ht 64.0 in | Wt 220.0 lb

## 2019-12-25 DIAGNOSIS — R002 Palpitations: Secondary | ICD-10-CM

## 2019-12-25 DIAGNOSIS — I480 Paroxysmal atrial fibrillation: Secondary | ICD-10-CM

## 2019-12-25 DIAGNOSIS — R0600 Dyspnea, unspecified: Secondary | ICD-10-CM

## 2019-12-25 DIAGNOSIS — Z7189 Other specified counseling: Secondary | ICD-10-CM

## 2019-12-25 MED ORDER — APIXABAN 5 MG PO TABS: 5.0000 mg | ORAL_TABLET | Freq: Two times a day (BID) | ORAL | 11 refills | Status: DC

## 2019-12-25 MED ORDER — MIDODRINE HCL 2.5 MG PO TABS: 2.5000 mg | ORAL_TABLET | Freq: Three times a day (TID) | ORAL | 3 refills | Status: DC

## 2019-12-25 NOTE — Patient Instructions (Addendum)
Medication Instructions:  START MIDODRINE 2.5MG  THREE TIMES A DAY (7A, 12P, 2P) START ELIQUIS 5MG  TWICE A DAY STOP ASPIRIN *If you need a refill on your cardiac medications before your next appointment, please call your pharmacy*  Lab Work: Your physician recommends that you return for lab work in 2 WEEKS (CBC) If you have labs (blood work) drawn and your tests are completely normal, you will receive your results only by: Marland Kitchen MyChart Message (if you have MyChart) OR . A paper copy in the mail If you have any lab test that is abnormal or we need to change your treatment, we will call you to review the results.  Testing/Procedures: NONE ORDERED THIS VISIT  Follow-Up: At [facility], you and your health needs are our priority.  As part of our continuing mission to provide you with exceptional heart care, we have created designated Provider Care Teams.  These Care Teams include your primary Cardiologist (physician) and Advanced Practice Providers (APPs -  Physician Assistants and Nurse Practitioners) who all work together to provide you with the care you need, when you need it.   Your next appointment:   1 month(s)  The format for your next appointment:   In Person  Provider:   Minus Breeding, MD   [facility] PATIENT ASSISTANCE APPLICATION AND 30 DAY CARD GIVEN

## 2019-12-26 ENCOUNTER — Telehealth: Payer: Self-pay | Admitting: Cardiology

## 2019-12-26 NOTE — Telephone Encounter (Signed)
Pt c/o medication issue:  1. Name of Medication: apixaban (ELIQUIS) 5 MG TABS tablet  2. How are you currently taking this medication (dosage and times per day)? Not currently taking medication   3. Are you having a reaction (difficulty breathing--STAT)? No   4. What is your medication issue? Kayona is calling stating she is not wanting to start Eliquis at this time, but states she is willing to discuss it more in depth at her 1 month f/u. Please advise.

## 2019-12-26 NOTE — Telephone Encounter (Signed)
OK.  We discussed the risk benefits of not using the DOAC.

## 2019-12-29 ENCOUNTER — Other Ambulatory Visit: Payer: Self-pay | Admitting: Nurse Practitioner

## 2019-12-29 DIAGNOSIS — K219 Gastro-esophageal reflux disease without esophagitis: Secondary | ICD-10-CM

## 2020-01-19 NOTE — Progress Notes (Signed)
Doris Smith  [phone] [phone]     ID: Doris Smith DOB: June 03, 1951  MR#: [id number]  ZGY#:[id number]  Patient Care Team: Chevis Pretty, FNP as PCP - General (Nurse Practitioner) Lendon Colonel, NP as PCP - Cardiology (Nurse Practitioner) Alphonsa Overall, MD as Consulting Physician (General Surgery) Brenleigh Collet, Virgie Dad, MD as Consulting Physician (Oncology) Kyung Rudd, MD as Consulting Physician (Radiation Oncology) Elijio Miles, MD as Consulting Physician (Pulmonary Disease) Netta Cedars, MD as Consulting Physician (Orthopedic Surgery) Susa Day, MD as Consulting Physician (Orthopedic Surgery) Janeth Rase, NP as Nurse Practitioner (Adult Health Nurse Practitioner) Mauro Kaufmann, RN as Oncology Nurse Navigator Rockwell Germany, RN as Oncology Nurse Navigator Irene Limbo, MD as Consulting Physician (Plastic Surgery) Minus Breeding, MD as Consulting Physician (Cardiology) Chauncey Cruel, MD OTHER MD: Chapman Moss, psychiatry   CHIEF COMPLAINT: Estrogen receptor positive breast cancer (s/p right mastectomy)  CURRENT TREATMENT: anastrozole; ibandronate   INTERVAL HISTORY: Doris Smith returns today for follow-up and treatment of her estrogen receptor positive breast cancer. She was last seen here on 11/10/2019.   She continues on anastrozole.  She does not have symptoms specifically to this medication although she generally does not feel well.  Hot flashes and vaginal dryness are not major concerns  She also continues on ibandronate.  She is using the appropriate precautions taking the medication, has had no reflux or other side effects from it  Doris Smith's last bone density screening on 10/13/2016, showed a T-score of -2.1, which is considered osteopenic.    Since her last visit here, she has not undergone any additional studies. She is behind on her annual mammography (uni left), which was due in 08/2019  from [place].    REVIEW OF SYSTEMS: Gerald just does not feel well.  She is irritable and depressed.  She tells me that she has been found to have atrial fibrillation and was started on Eliquis by Dr. Percival Spanish.  However she is not taking that medication.  She is not exercising regularly.  She denies unusual headaches, visual changes, nausea, vomiting, cough, phlegm production or pleurisy.  There have been no change in bowel or bladder habits.   HISTORY OF CURRENT ILLNESS: From the original intake note:  "Doris Smith" presented to her PCP, Dr. Hassell Done, with right breast pain, fullness, and nipple inversion since November 2019. She proceeded to undergo bilateral diagnostic mammography with tomography and right breast ultrasonography at [facility] on 07/31/2018 showing: findings compatible with multicentric breast cancer spanning at least the upper inner and upper outer quadrants of the right breast; normal right axilla. Palpable firmness of approximately 1.5 cm was noted in the 9 o'clock position, which was also seen on ultrasound with indisctinct margins measuring 2.7 x 2.3 x 2.2 cm. At 1 o'clock, a mass with poorly defined margins measures 1.9 x 1 x 0.8 cm. Additional poorly defined masses were seen in the 12 o'clock retroareolar region.  Accordingly on 08/02/2018 she proceeded to biopsy of the right breast area in question. The pathology ([id number]) from this procedure showed: invasive mammary carcinoma at 9 o'clock and 1 o'clock; e-cadherin negative, consistent with lobular phenotype; grade 2-3. Prognostic indicators significant for: estrogen receptor, 90% positive and progesterone receptor, 1% positive, both with strong staining intensity. Proliferation marker Ki67 at 1%. HER2 equivocal by immunohistochemistry, 2+ but negative by fluorescent in situ hybridization with a signals ratio 1.04 and number per cell 1.2.   The patient's subsequent history is as detailed below.  PAST MEDICAL  HISTORY: Past Medical History:  Diagnosis Date  . A-fib ([place]) 11/2012   PAF  . Anxiety    takes Ativan  . Asthma 04/07/2011   dx  . Bipolar disorder ([place])   . Cancer [facility])    Right breast  . Depression   . Early cataracts, bilateral   . Fatty liver   . Fibromyalgia   . GERD (gastroesophageal reflux disease)   . Irritable bowel syndrome   . Mental disorder    dx bipolar  . PONV (postoperative nausea and vomiting)   . Sleep apnea 04/2011    does not wear CPAP  She has chronic sinus headaches, hiatal hernia   PAST SURGICAL HISTORY: Past Surgical History:  Procedure Laterality Date  . ABDOMINAL HYSTERECTOMY    . COLONOSCOPY    . DILATION AND CURETTAGE OF UTERUS  1981   abnormal pap  . KNEE ARTHROSCOPY Left 2007   x 2  . LUMBAR LAMINECTOMY/DECOMPRESSION MICRODISCECTOMY  05/31/2011   Procedure: LUMBAR LAMINECTOMY/DECOMPRESSION MICRODISCECTOMY;  Surgeon: Johnn Hai;  Location: [facility];  Service: Orthopedics;  Laterality: Left;  Decompression Lumbar four to five and  Lumbar five to Sacral one on Left  (X-Ray)  . MASTECTOMY W/ SENTINEL NODE BIOPSY Right 09/16/2018   Procedure: RIGHT MASTECTOMY WITH RIGHT AXILLARY SENTINEL LYMPH NODE BIOPSY;  Surgeon: Alphonsa Overall, MD;  Location: [place];  Service: General;  Laterality: Right;  . PARTIAL HYSTERECTOMY  1982  . PORTACATH PLACEMENT Left 10/31/2018   Procedure: INSERTION PORT-A-CATH WITH ULTRASOUND;  Surgeon: Alphonsa Overall, MD;  Location: [facility];  Service: General;  Laterality: Left;  . TONSILLECTOMY     as child  . TOTAL KNEE ARTHROPLASTY Left 12/18/2005    FAMILY HISTORY Family History  Problem Relation Age of Onset  . Anesthesia problems Mother   . Heart disease Mother        CHF, atrial fib  . Heart failure Mother   . Anesthesia problems Sister   . Colon polyps Father   . Heart disease Father        "Fluid around the heart"  . Hypertension Sister   . Breast cancer Maternal Aunt   . Colon cancer  Maternal Aunt   . Prostate cancer Neg Hx   . Ovarian cancer Neg Hx    As of February 2019, patient father is alive at 41 years old. Patient mother is alive at 55 years old. She notes a family hx of breast cancer. A maternal aunt was diagnosed with breast cancer, but Doris Smith is unsure if it was in one or both breasts. She has 3 siblings, 3 sisters and 0 brothers.   GYNECOLOGIC HISTORY:  No LMP recorded. Patient is postmenopausal. Menarche: 69 years old Age at first live birth: 69 years old [place] P 2 LMP about 42 years ago Contraceptive no HRT no  Hysterectomy? partial So? no   SOCIAL HISTORY: Doris Smith worked in [employer] for 15 years. She is on disability because her back, knees, and nerves. Her husband, [name], has been at [employer] 43 years as a Freight forwarder.  Even though their home is in [place] they are actually living in [place] in an apartment while her husband works there.  Son Legrand Como, age 13, works as a Chief Strategy Officer in [place], [place]. Son Shanon Brow, age 55, works as a Building control surveyor in [place], [place]. They have 4 grandchildren, 2 great-grandchildren with 2 more on the way. She attends a [employer].   ADVANCED DIRECTIVES:  In the absence of any documents to the contrary the patient's husband is her healthcare power of attorney   HEALTH MAINTENANCE: Social History   Tobacco Use  . Smoking status: Never Smoker  . Smokeless tobacco: Never Used  Vaping Use  . Vaping Use: Never used  Substance Use Topics  . Alcohol use: No  . Drug use: No     Colonoscopy: 2019  PAP: 11/03/2014, normal  Bone density: 10/13/2016, T-score of -2.1   Allergies  Allergen Reactions  . Iohexol     "over 20 years ago" had reaction "hurting in arm and heart"-Done in [place], [place].Marland Kitchenphysician stopped CT at that time.  . Codeine Other (See Comments)    Makes feel like she is wild   . Palonosetron Other (See Comments)    headache  . Prednisone Other (See Comments)    Makes me very irritable  . Sulfonamide  Derivatives Other (See Comments)    Upset stomach  . Celecoxib Nausea And Vomiting  . Sulfa Antibiotics Nausea And Vomiting  . Vitamin B12 Rash    Current Outpatient Medications  Medication Sig Dispense Refill  . anastrozole (ARIMIDEX) 1 MG tablet Take 1 tablet (1 mg total) by mouth daily. 90 tablet 3  . apixaban (ELIQUIS) 5 MG TABS tablet Take 1 tablet (5 mg total) by mouth 2 (two) times daily. 60 tablet 11  . benzonatate (TESSALON PERLES) 100 MG capsule Take 1 capsule (100 mg total) by mouth 3 (three) times daily as needed for cough. 20 capsule 0  . clotrimazole-betamethasone (LOTRISONE) cream APPLY  CREAM TOPICALLY TWICE DAILY 45 g 0  . esomeprazole (NEXIUM) 40 MG capsule Take 1 capsule by mouth once daily 90 capsule 0  . gabapentin (NEURONTIN) 300 MG capsule Take 300 mg by mouth 3 (three) times daily.    Marland Kitchen ibandronate (BONIVA) 150 MG tablet Take 1 tablet (150 mg total) by mouth every 30 (thirty) days. Take in the morning with a full glass of water, on an empty stomach, and do not take anything else by mouth or lie down for the next 30 min. 3 tablet 4  . lamoTRIgine (LAMICTAL) 100 MG tablet Take 100 mg by mouth in the morning, at noon, and at bedtime.    Marland Kitchen loratadine (CLARITIN) 10 MG tablet Take 10 mg by mouth daily.    Marland Kitchen LORazepam (ATIVAN) 0.5 MG tablet Take 1 tablet (0.5 mg total) by mouth at bedtime as needed (Nausea or vomiting). 30 tablet 0  . metoprolol tartrate (LOPRESSOR) 50 MG tablet Take 50 mg by mouth 2 (two) times daily.     . midodrine (PROAMATINE) 2.5 MG tablet Take 1 tablet (2.5 mg total) by mouth 3 (three) times daily with meals. 270 tablet 3  . promethazine-dextromethorphan (PROMETHAZINE-DM) 6.25-15 MG/5ML syrup TAKE 5 MILLILITERS BY MOUTH 4 TIMES DAILY AS NEEDED FOR COUGH    . Vilazodone HCl (VIIBRYD) 40 MG TABS Take 40 mg by mouth daily.     No current facility-administered medications for this visit.   Facility-Administered Medications Ordered in Other Visits    Medication Dose Route Frequency Provider Last Rate Last Admin  . heparin lock flush 100 unit/mL  500 Units Intravenous Once Jamielynn Wigley, Virgie Dad, MD      . sodium chloride flush (NS) 0.9 % injection 10 mL  10 mL Intravenous PRN Jeremih Dearmas, Virgie Dad, MD        OBJECTIVE: white woman using a cane  Vitals:   01/20/20 1121  BP: 132/62  Pulse: Marland Kitchen)  58  Resp: 18  Temp: 98.3 F (36.8 C)  SpO2: 99%   Wt Readings from Last 3 Encounters:  01/20/20 225 lb 1.6 oz (102.1 kg)  12/25/19 220 lb (99.8 kg)  11/10/19 216 lb 12.8 oz (98.3 kg)   Body mass index is 38.64 kg/m.    ECOG FS:2 - Symptomatic, <50% confined to bed  Ocular: Sclerae unicteric, pupils round and equal Ear-nose-throat: Wearing a mask Lymphatic: No cervical or supraclavicular adenopathy Lungs no rales or rhonchi Heart regular rate and rhythm Abd soft, nontender, positive bowel sounds MSK no focal spinal tenderness, no joint edema Neuro: non-focal, well-oriented, appropriate affect Breasts: Status post right mastectomy with no evidence of chest wall recurrence.  The left breast is benign.  Both axillae are benign.  LAB RESULTS:  CMP     Component Value Date/Time   NA 142 01/20/2020 1112   NA 141 05/15/2019 1252   K 4.2 01/20/2020 1112   CL 108 01/20/2020 1112   CO2 27 01/20/2020 1112   GLUCOSE 93 01/20/2020 1112   BUN 13 01/20/2020 1112   BUN 11 05/15/2019 1252   CREATININE 0.94 01/20/2020 1112   CREATININE 0.90 12/19/2018 1434   CALCIUM 9.2 01/20/2020 1112   PROT 6.8 01/20/2020 1112   PROT 6.3 05/15/2019 1252   ALBUMIN 3.4 (L) 01/20/2020 1112   ALBUMIN 3.9 05/15/2019 1252   AST 14 (L) 01/20/2020 1112   AST 14 (L) 08/14/2018 0758   ALT 12 01/20/2020 1112   ALT 10 08/14/2018 0758   ALKPHOS 95 01/20/2020 1112   BILITOT 0.4 01/20/2020 1112   BILITOT 0.3 05/15/2019 1252   BILITOT 0.5 08/14/2018 0758   GFRNONAA >60 01/20/2020 1112   GFRNONAA >60 12/19/2018 1434   GFRAA >60 01/20/2020 1112   GFRAA >60  12/19/2018 1434    No results found for: TOTALPROTELP, ALBUMINELP, A1GS, A2GS, BETS, BETA2SER, GAMS, MSPIKE, SPEI  No results found for: KPAFRELGTCHN, LAMBDASER, KAPLAMBRATIO  Lab Results  Component Value Date   WBC 4.6 01/20/2020   NEUTROABS 2.8 01/20/2020   HGB 11.5 (L) 01/20/2020   HCT 36.1 01/20/2020   MCV 88.0 01/20/2020   PLT 235 01/20/2020   No results found for: LABCA2  No components found for: [id number]  No results for input(s): INR in the last 168 hours.  No results found for: LABCA2  No results found for: WER154  No results found for: MGQ676  No results found for: PPJ093  No results found for: CA2729  No components found for: HGQUANT  No results found for: CEA1 / No results found for: CEA1   No results found for: AFPTUMOR  No results found for: CHROMOGRNA  No results found for: HGBA, HGBA2QUANT, HGBFQUANT, HGBSQUAN (Hemoglobinopathy evaluation)   No results found for: LDH  No results found for: IRON, TIBC, IRONPCTSAT (Iron and TIBC)  No results found for: FERRITIN  Urinalysis    Component Value Date/Time   COLORURINE YELLOW 12/19/2018 1355   APPEARANCEUR HAZY (A) 12/19/2018 1355   APPEARANCEUR Clear 05/03/2018 1204   LABSPEC 1.016 12/19/2018 1355   PHURINE 5.0 12/19/2018 [place] 12/19/2018 [place] 12/19/2018 [place] 12/19/2018 1355   BILIRUBINUR Negative 05/03/2018 1204   KETONESUR NEGATIVE 12/19/2018 1355   PROTEINUR NEGATIVE 12/19/2018 1355   UROBILINOGEN negative 11/03/2014 1039   UROBILINOGEN 0.2 05/25/2011 0923   NITRITE NEGATIVE 12/19/2018 1355   LEUKOCYTESUR SMALL (A) 12/19/2018 1355    STUDIES: No results found.  ELIGIBLE FOR AVAILABLE RESEARCH PROTOCOL: no   ASSESSMENT: 69 y.o. [place], [place] woman status post right breast biopsy 08/02/2018 for a clinical T3 N0, stage IIA invasive lobular carcinoma, grade 2, estrogen receptor strongly positive, progesterone receptor 1%  positive, with no HER-2 amplification and an MIB-1 of 1%.  (a) CT scan of the head and chest, without contrast 08/23/2018 showed nonspecific 0.4 cm left lower lobe pulmonary nodule, no definitive metastatic disease  (b) bone scan 08/23/2018 shows multiple spinal areas of abnormal uptake, but  (c) total spinal MRI 09/03/2018 finds bone scan findings to be secondary to degenerative disease, no evidence of metastatic disease.  (1) status post right mastectomy and sentinel lymph node sampling 09/16/2018 showing a pT3 pN1(mic), stage IIA-IIIA invasive lobular breast cancer, grade 2, with 1 of 5 sampled lymph nodes involved by micrometastatic deposit, with extra nodular extension; ample margins  (2)  MammaPrint "high risk" suggests a 5-year metastasis free survival of 93% with chemotherapy, with an absolute chemotherapy benefit in the greater than 12% range  (3) adjuvant radiation 12/31/2018-02/19/2019:  The right chest wall and regional nodes were treated to 50.4 Gy in 28 fractions followed by a 10 Gy boost over 5 fractions.   (4) anastrozole started neoadjuvantly (on 08/14/2018), discontinued 10/14/2018 in preparation for chemotherapy, resumed October 2020  (a) bone density 10/13/2016 showed osteopenia with a lowest score at -2.1  (b) bone density JAN 2021  (c) ibandronate/Boniva started January 2021  (5) [facility] testing on mastectomy sample (09/16/2018) showed stable MSI and proficient mismatch repair status, with a low mutational burden; BRCA 1 and 2 were not mutated, PI K3 was not mutated, ER B B2 was not mutated, and a KT 1 was not mutated.  The androgen receptor was positive (90% at 2+) and there was a pathogenic PTEN variant an excellent 3 (c.209+1G>A)  (6) adjuvant chemotherapy consisting of cyclophosphamide, methotrexate, fluorouracil (CMF) started 11/05/2018, repeated every 21 days x 8, last dose 03/02/2019  (a) methotrexate was omitted during concurrent radiation (cycles 4, 5 and 6)  (7)  continuing on anastrozole and ibandronate   PLAN: Doris Smith is about a year and a half out from definitive surgery for her breast cancer with no evidence of disease recurrence.  This is favorable.  She is tolerating the anastrozole and Boniva well and the plan is to continue both for a total of 5 years.  I discussed the importance of her complying with the cardiac medications she has been started on.  She is going to be discussing this further with Dr. Percival Spanish at their next visit  She is behind on mammography and I have put in the order for a left screening mammogram.  She will see me again in about 6 months.  She knows to call for any other issues that may develop before then  Total encounter time 25 minutes.Sarajane Jews C. Casondra Gasca, MD  01/20/20 6:39 PM Medical Oncology and Hematology [facility] [place], [place] [address] Tel. [phone]    Fax. [phone]   I, Jacqualyn Posey am acting as a Education administrator for Chauncey Cruel, MD.   I, Lurline Del MD, have reviewed the above documentation for accuracy and completeness, and I agree with the above.    *Total Encounter Time as defined by the Centers for Medicare and Medicaid Services includes, in addition to the face-to-face time of a patient visit (documented in the note above) non-face-to-face time: obtaining and reviewing outside history, ordering and  reviewing medications, tests or procedures, care coordination (communications with other health care professionals or caregivers) and documentation in the medical record.

## 2020-01-20 ENCOUNTER — Inpatient Hospital Stay: Payer: BC Managed Care – PPO | Attending: Oncology

## 2020-01-20 ENCOUNTER — Other Ambulatory Visit: Payer: Self-pay

## 2020-01-20 ENCOUNTER — Inpatient Hospital Stay (HOSPITAL_BASED_OUTPATIENT_CLINIC_OR_DEPARTMENT_OTHER): Payer: BC Managed Care – PPO | Admitting: Oncology

## 2020-01-20 ENCOUNTER — Ambulatory Visit: Payer: BC Managed Care – PPO

## 2020-01-20 ENCOUNTER — Inpatient Hospital Stay: Payer: BC Managed Care – PPO

## 2020-01-20 VITALS — BP 132/62 | HR 58 | Temp 98.3°F | Resp 18 | Ht 64.0 in | Wt 225.1 lb

## 2020-01-20 DIAGNOSIS — F319 Bipolar disorder, unspecified: Secondary | ICD-10-CM | POA: Insufficient documentation

## 2020-01-20 DIAGNOSIS — Z803 Family history of malignant neoplasm of breast: Secondary | ICD-10-CM | POA: Diagnosis not present

## 2020-01-20 DIAGNOSIS — K589 Irritable bowel syndrome without diarrhea: Secondary | ICD-10-CM | POA: Insufficient documentation

## 2020-01-20 DIAGNOSIS — G4730 Sleep apnea, unspecified: Secondary | ICD-10-CM | POA: Diagnosis not present

## 2020-01-20 DIAGNOSIS — C50811 Malignant neoplasm of overlapping sites of right female breast: Secondary | ICD-10-CM | POA: Diagnosis not present

## 2020-01-20 DIAGNOSIS — C50411 Malignant neoplasm of upper-outer quadrant of right female breast: Secondary | ICD-10-CM | POA: Diagnosis not present

## 2020-01-20 DIAGNOSIS — Z79899 Other long term (current) drug therapy: Secondary | ICD-10-CM | POA: Diagnosis not present

## 2020-01-20 DIAGNOSIS — I480 Paroxysmal atrial fibrillation: Secondary | ICD-10-CM | POA: Insufficient documentation

## 2020-01-20 DIAGNOSIS — Z800 Family history of malignant neoplasm of digestive organs: Secondary | ICD-10-CM | POA: Diagnosis not present

## 2020-01-20 DIAGNOSIS — Z8249 Family history of ischemic heart disease and other diseases of the circulatory system: Secondary | ICD-10-CM | POA: Insufficient documentation

## 2020-01-20 DIAGNOSIS — F419 Anxiety disorder, unspecified: Secondary | ICD-10-CM | POA: Diagnosis not present

## 2020-01-20 DIAGNOSIS — C50211 Malignant neoplasm of upper-inner quadrant of right female breast: Secondary | ICD-10-CM | POA: Diagnosis not present

## 2020-01-20 DIAGNOSIS — Z79811 Long term (current) use of aromatase inhibitors: Secondary | ICD-10-CM | POA: Insufficient documentation

## 2020-01-20 DIAGNOSIS — Z90710 Acquired absence of both cervix and uterus: Secondary | ICD-10-CM | POA: Diagnosis not present

## 2020-01-20 DIAGNOSIS — J45909 Unspecified asthma, uncomplicated: Secondary | ICD-10-CM | POA: Insufficient documentation

## 2020-01-20 DIAGNOSIS — Z170 Estrogen receptor positive status [ER+]: Secondary | ICD-10-CM | POA: Insufficient documentation

## 2020-01-20 DIAGNOSIS — M8580 Other specified disorders of bone density and structure, unspecified site: Secondary | ICD-10-CM | POA: Diagnosis not present

## 2020-01-20 DIAGNOSIS — K219 Gastro-esophageal reflux disease without esophagitis: Secondary | ICD-10-CM | POA: Insufficient documentation

## 2020-01-20 DIAGNOSIS — Z7901 Long term (current) use of anticoagulants: Secondary | ICD-10-CM | POA: Diagnosis not present

## 2020-01-20 DIAGNOSIS — Z95828 Presence of other vascular implants and grafts: Secondary | ICD-10-CM

## 2020-01-20 LAB — CBC WITH DIFFERENTIAL/PLATELET
Abs Immature Granulocytes: 0.02 10*3/uL (ref 0.00–0.07)
Basophils Absolute: 0.1 10*3/uL (ref 0.0–0.1)
Basophils Relative: 1 %
Eosinophils Absolute: 0.2 10*3/uL (ref 0.0–0.5)
Eosinophils Relative: 4 %
HCT: 36.1 % (ref 36.0–46.0)
Hemoglobin: 11.5 g/dL — ABNORMAL LOW (ref 12.0–15.0)
Immature Granulocytes: 0 %
Lymphocytes Relative: 18 %
Lymphs Abs: 0.8 10*3/uL (ref 0.7–4.0)
MCH: 28 pg (ref 26.0–34.0)
MCHC: 31.9 g/dL (ref 30.0–36.0)
MCV: 88 fL (ref 80.0–100.0)
Monocytes Absolute: 0.7 10*3/uL (ref 0.1–1.0)
Monocytes Relative: 15 %
Neutro Abs: 2.8 10*3/uL (ref 1.7–7.7)
Neutrophils Relative %: 62 %
Platelets: 235 10*3/uL (ref 150–400)
RBC: 4.1 MIL/uL (ref 3.87–5.11)
RDW: 14.6 % (ref 11.5–15.5)
WBC: 4.6 10*3/uL (ref 4.0–10.5)
nRBC: 0 % (ref 0.0–0.2)

## 2020-01-20 LAB — COMPREHENSIVE METABOLIC PANEL
ALT: 12 U/L (ref 0–44)
AST: 14 U/L — ABNORMAL LOW (ref 15–41)
Albumin: 3.4 g/dL — ABNORMAL LOW (ref 3.5–5.0)
Alkaline Phosphatase: 95 U/L (ref 38–126)
Anion gap: 7 (ref 5–15)
BUN: 13 mg/dL (ref 8–23)
CO2: 27 mmol/L (ref 22–32)
Calcium: 9.2 mg/dL (ref 8.9–10.3)
Chloride: 108 mmol/L (ref 98–111)
Creatinine, Ser: 0.94 mg/dL (ref 0.44–1.00)
GFR calc Af Amer: >60 mL/min (ref >60)
GFR calc non Af Amer: >60 mL/min (ref >60)
Glucose, Bld: 93 mg/dL (ref 70–99)
Potassium: 4.2 mmol/L (ref 3.5–5.1)
Sodium: 142 mmol/L (ref 135–145)
Total Bilirubin: 0.4 mg/dL (ref 0.3–1.2)
Total Protein: 6.8 g/dL (ref 6.5–8.1)

## 2020-01-20 MED ORDER — SODIUM CHLORIDE 0.9% FLUSH
10.0000 mL | INTRAVENOUS | Status: DC | PRN
  Filled 2020-01-20: qty 10

## 2020-01-20 MED ORDER — HEPARIN SOD (PORK) LOCK FLUSH 100 UNIT/ML IV SOLN
500.0000 [IU] | Freq: Once | INTRAVENOUS | Status: DC
  Filled 2020-01-20: qty 5

## 2020-01-25 NOTE — Progress Notes (Signed)
Cardiology Office Note   Date:  01/26/2020   ID:  Doris Smith, DOB 1951-06-08, MRN [id number]  PCP:  Chevis Pretty, FNP  Cardiologist:  Chief Complaint  Patient presents with  . Follow-up    1 MONTH  . Shortness of Breath     History of Present Illness: Doris Smith is a 69 y.o. female who presents for ongoing assessment and management of palpitations, prior history of atrial fib, CHA2DS2 - VASc score of 2. She was in the [facility] ED in March of this year with atrial fib with RVR.  She feels dizzy when she stands up or when she walks even about 100 yards.  She has to use her cane because of back and knee problems.  She tries to be walking but things are spinning when she is even a short distance.  She is not had any frank syncope.  On last office visit on 12/25/2019 with Dr. Percival Spanish, ASA was discontinued as she was placed on Eliquis 5 mg BID.Repeat CBC was ordered. She was significantly orthostatic which was felt to be related to BB. She did not want to stop BB.  She was started on midodrine.   She comes today stating that she has not yet started Eliquis as she was afraid to begin it without further information.  She is asking questions about the necessity of taking anticoagulation medications.  She is also worried about bleeding.  She feels very fatigued, continues to have dizzy spells, most occur when she gets hot, but not always.  They can occur when she is sitting.  She states that she sometimes forgets to take the middle dose of midodrine but usually takes the a.m. and p.m. dose.   Past Medical History:  Diagnosis Date  . A-fib ([place]) 11/2012   PAF  . Anxiety    takes Ativan  . Asthma 04/07/2011   dx  . Bipolar disorder ([place])   . Cancer [facility])    Right breast  . Depression   . Early cataracts, bilateral   . Fatty liver   . Fibromyalgia   . GERD (gastroesophageal reflux disease)   . Irritable bowel syndrome   . Mental disorder    dx bipolar  . PONV  (postoperative nausea and vomiting)   . Sleep apnea 04/2011    does not wear CPAP    Past Surgical History:  Procedure Laterality Date  . ABDOMINAL HYSTERECTOMY    . COLONOSCOPY    . DILATION AND CURETTAGE OF UTERUS  1981   abnormal pap  . KNEE ARTHROSCOPY Left 2007   x 2  . LUMBAR LAMINECTOMY/DECOMPRESSION MICRODISCECTOMY  05/31/2011   Procedure: LUMBAR LAMINECTOMY/DECOMPRESSION MICRODISCECTOMY;  Surgeon: Johnn Hai;  Location: [facility];  Service: Orthopedics;  Laterality: Left;  Decompression Lumbar four to five and  Lumbar five to Sacral one on Left  (X-Ray)  . MASTECTOMY W/ SENTINEL NODE BIOPSY Right 09/16/2018   Procedure: RIGHT MASTECTOMY WITH RIGHT AXILLARY SENTINEL LYMPH NODE BIOPSY;  Surgeon: Alphonsa Overall, MD;  Location: [place];  Service: General;  Laterality: Right;  . PARTIAL HYSTERECTOMY  1982  . PORTACATH PLACEMENT Left 10/31/2018   Procedure: INSERTION PORT-A-CATH WITH ULTRASOUND;  Surgeon: Alphonsa Overall, MD;  Location: [place];  Service: General;  Laterality: Left;  . TONSILLECTOMY     as child  . TOTAL KNEE ARTHROPLASTY Left 12/18/2005     Current Outpatient Medications  Medication Sig Dispense Refill  . anastrozole (ARIMIDEX) 1 MG tablet Take  1 tablet (1 mg total) by mouth daily. 90 tablet 3  . apixaban (ELIQUIS) 5 MG TABS tablet Take 1 tablet (5 mg total) by mouth 2 (two) times daily. 180 tablet 3  . benzonatate (TESSALON PERLES) 100 MG capsule Take 1 capsule (100 mg total) by mouth 3 (three) times daily as needed for cough. 20 capsule 0  . clotrimazole-betamethasone (LOTRISONE) cream APPLY  CREAM TOPICALLY TWICE DAILY 45 g 0  . esomeprazole (NEXIUM) 40 MG capsule Take 1 capsule by mouth once daily 90 capsule 0  . gabapentin (NEURONTIN) 300 MG capsule Take 300 mg by mouth 3 (three) times daily.    Marland Kitchen ibandronate (BONIVA) 150 MG tablet Take 1 tablet (150 mg total) by mouth every 30 (thirty) days. Take in the morning with a full glass of water, on an  empty stomach, and do not take anything else by mouth or lie down for the next 30 min. 3 tablet 4  . lamoTRIgine (LAMICTAL) 100 MG tablet Take 100 mg by mouth in the morning, at noon, and at bedtime.    Marland Kitchen loratadine (CLARITIN) 10 MG tablet Take 10 mg by mouth daily.    Marland Kitchen LORazepam (ATIVAN) 0.5 MG tablet Take 1 tablet (0.5 mg total) by mouth at bedtime as needed (Nausea or vomiting). 30 tablet 0  . midodrine (PROAMATINE) 2.5 MG tablet Take 1 tablet (2.5 mg total) by mouth 3 (three) times daily with meals. 270 tablet 3  . promethazine-dextromethorphan (PROMETHAZINE-DM) 6.25-15 MG/5ML syrup TAKE 5 MILLILITERS BY MOUTH 4 TIMES DAILY AS NEEDED FOR COUGH    . Vilazodone HCl (VIIBRYD) 40 MG TABS Take 40 mg by mouth daily.    . metoprolol tartrate (LOPRESSOR) 25 MG tablet Take 1 tablet (25 mg total) by mouth 2 (two) times daily. 180 tablet 3   No current facility-administered medications for this visit.   Facility-Administered Medications Ordered in Other Visits  Medication Dose Route Frequency Provider Last Rate Last Admin  . heparin lock flush 100 unit/mL  500 Units Intravenous Once Magrinat, Virgie Dad, MD      . sodium chloride flush (NS) 0.9 % injection 10 mL  10 mL Intravenous PRN Magrinat, Virgie Dad, MD        Allergies:   Iohexol, Codeine, Palonosetron, Prednisone, Sulfonamide derivatives, Celecoxib, Sulfa antibiotics, and Vitamin b12    Social History:  The patient  reports that she has never smoked. She has never used smokeless tobacco. She reports that she does not drink alcohol and does not use drugs.   Family History:  The patient's family history includes Anesthesia problems in her mother and sister; Breast cancer in her maternal aunt; Colon cancer in her maternal aunt; Colon polyps in her father; Heart disease in her father and mother; Heart failure in her mother; Hypertension in her sister.    ROS: All other systems are reviewed and negative. Unless otherwise mentioned in H&P     PHYSICAL EXAM: VS:  BP 110/60 (BP Location: Left Arm, Patient Position: Sitting, Cuff Size: Large)   Pulse (!) 57   Ht 5\' 4"  (1.626 m)   Wt 223 lb 9.6 oz (101.4 kg)   SpO2 96%   BMI 38.38 kg/m  , BMI Body mass index is 38.38 kg/m.   GEN: Well nourished, well developed, in no acute distress HEENT: normal Neck: no JVD, carotid bruits, or masses Cardiac: RRR, bradycardic; no murmurs, rubs, or gallops,no edema  Respiratory:  Clear to auscultation bilaterally, normal work of breathing GI: soft, nontender,  nondistended, + BS MS: no deformity or atrophy Skin: warm and dry, no rash Neuro:  Strength and sensation are intact Psych: euthymic mood, occasionally tearful.   EKG: Sinus bradycardia, heart rate 50 bpm, (personally reviewed)   Recent Labs: 09/05/2019: BNP 107.1; TSH 1.520 01/20/2020: ALT 12; BUN 13; Creatinine, Ser 0.94; Hemoglobin 11.5; Platelets 235; Potassium 4.2; Sodium 142    Lipid Panel    Component Value Date/Time   CHOL 177 05/15/2019 1252   TRIG 116 05/15/2019 1252   TRIG 114 11/03/2014 1119   HDL 76 05/15/2019 1252   HDL 82 11/03/2014 1119   CHOLHDL 2.3 05/15/2019 1252   LDLCALC 81 05/15/2019 1252   LDLCALC 94 10/01/2013 0926      Wt Readings from Last 3 Encounters:  01/26/20 223 lb 9.6 oz (101.4 kg)  01/20/20 225 lb 1.6 oz (102.1 kg)  12/25/19 220 lb (99.8 kg)      Other studies Reviewed: Echo 12/02/2019 1. Normal LV function; trace AI; GLS -24.9%.  2. Left ventricular ejection fraction, by estimation, is 60 to 65%. The  left ventricle has normal function. The left ventricle has no regional  wall motion abnormalities. Left ventricular diastolic parameters were  normal.  3. Right ventricular systolic function is normal. The right ventricular  size is normal. There is normal pulmonary artery systolic pressure.  4. The mitral valve is normal in structure. Trivial mitral valve  regurgitation. No evidence of mitral stenosis.  5. The aortic  valve is tricuspid. Aortic valve regurgitation is trivial.  No aortic stenosis is present.  6. The inferior vena cava is normal in size with greater than 50%  respiratory variability, suggesting right atrial pressure of 3 mmHg.    ASSESSMENT AND PLAN:  1.  Paroxysmal atrial fibrillation: I have spent a good bit of time explaining to her the need to be on anticoagulation therapy with Eliquis, CVA prophylaxis, and potential to have some bleeding.  I have told her that the risk of CVA is a real concern.  After answering multiple questions she is willing to start taking Eliquis.  I will order a baseline CBC and BMET today.  Her heart rate is bradycardic on metoprolol 50 mg twice daily.  After careful negotiation, she is willing to reduce the dose to 25 mg twice daily because she is feeling so tired.  Hopefully with lower dose of beta-blocker heart rate will be better and she will feel less fatigued.  She is to call us if she has any breakthrough rapid heart rhythm.  2.  Bradycardia: She appears symptomatic with low heart rate to include dizzy spells, and hypotension, along with fatigue.  We will reevaluate her once we change the dose of the beta-blocker on follow-up.  Current medicines are reviewed at length with the patient today.  I have spent 35 minutes dedicated to the care of this patient on the date of this encounter to include pre-visit review of records, assessment, management and diagnostic testing,with shared decision making.  Labs/ tests ordered today include: CBC, BMET.  Phill Myron. West Pugh, ANP, AACC   01/26/2020 12:05 PM    [address] Office [phone] Fax [phone]  Notice: This dictation was prepared with Dragon dictation along with smaller phrase technology. Any transcriptional errors that result from this process are unintentional and may not be corrected upon review.

## 2020-01-26 ENCOUNTER — Other Ambulatory Visit: Payer: Self-pay

## 2020-01-26 ENCOUNTER — Encounter: Payer: Self-pay | Admitting: Adult Health

## 2020-01-26 ENCOUNTER — Ambulatory Visit (INDEPENDENT_AMBULATORY_CARE_PROVIDER_SITE_OTHER): Payer: BC Managed Care – PPO | Admitting: Adult Health

## 2020-01-26 VITALS — BP 110/60 | HR 57 | Ht 64.0 in | Wt 223.6 lb

## 2020-01-26 DIAGNOSIS — I480 Paroxysmal atrial fibrillation: Secondary | ICD-10-CM | POA: Diagnosis not present

## 2020-01-26 DIAGNOSIS — I951 Orthostatic hypotension: Secondary | ICD-10-CM | POA: Diagnosis not present

## 2020-01-26 DIAGNOSIS — R001 Bradycardia, unspecified: Secondary | ICD-10-CM | POA: Diagnosis not present

## 2020-01-26 DIAGNOSIS — R5383 Other fatigue: Secondary | ICD-10-CM | POA: Diagnosis not present

## 2020-01-26 DIAGNOSIS — Z79899 Other long term (current) drug therapy: Secondary | ICD-10-CM

## 2020-01-26 LAB — CBC
Hematocrit: 36.5 % (ref 34.0–46.6)
Hemoglobin: 12.1 g/dL (ref 11.1–15.9)
MCH: 28.3 pg (ref 26.6–33.0)
MCHC: 33.2 g/dL (ref 31.5–35.7)
MCV: 85 fL (ref 79–97)
Platelets: 250 10*3/uL (ref 150–450)
RBC: 4.28 x10E6/uL (ref 3.77–5.28)
RDW: 14.8 % (ref 11.7–15.4)
WBC: 5.4 10*3/uL (ref 3.4–10.8)

## 2020-01-26 LAB — BASIC METABOLIC PANEL
BUN/Creatinine Ratio: 15 (ref 12–28)
BUN: 12 mg/dL (ref 8–27)
CO2: 24 mmol/L (ref 20–29)
Calcium: 9.1 mg/dL (ref 8.7–10.3)
Chloride: 104 mmol/L (ref 96–106)
Creatinine, Ser: 0.8 mg/dL (ref 0.57–1.00)
GFR calc Af Amer: 89 mL/min/{1.73_m2} (ref >59)
GFR calc non Af Amer: 77 mL/min/{1.73_m2} (ref >59)
Glucose: 75 mg/dL (ref 65–99)
Potassium: 4.4 mmol/L (ref 3.5–5.2)
Sodium: 142 mmol/L (ref 134–144)

## 2020-01-26 LAB — TSH: TSH: 3.26 u[IU]/mL (ref 0.450–4.500)

## 2020-01-26 MED ORDER — METOPROLOL TARTRATE 25 MG PO TABS
25.0000 mg | ORAL_TABLET | Freq: Two times a day (BID) | ORAL | 3 refills | Status: DC
Start: 2020-01-26 — End: 2021-02-01

## 2020-01-26 MED ORDER — APIXABAN 5 MG PO TABS: 5.0000 mg | ORAL_TABLET | Freq: Two times a day (BID) | ORAL | 3 refills | Status: DC

## 2020-01-26 NOTE — Patient Instructions (Addendum)
Medication Instructions:  RESTART- Eliquis 5 mg by mouth twice a day DECREASE- Metoprolol 25 mg by mouth twice a day  *If you need a refill on your cardiac medications before your next appointment, please call your pharmacy*   Lab Work: CBC, BMP and TSH  If you have labs (blood work) drawn today and your tests are completely normal, you will receive your results only by: Marland Kitchen MyChart Message (if you have MyChart) OR . A paper copy in the mail If you have any lab test that is abnormal or we need to change your treatment, we will call you to review the results.   Testing/Procedures: None Ordered   Follow-Up: At [facility], you and your health needs are our priority.  As part of our continuing mission to provide you with exceptional heart care, we have created designated Provider Care Teams.  These Care Teams include your primary Cardiologist (physician) and Advanced Practice Providers (APPs -  Physician Assistants and Nurse Practitioners) who all work together to provide you with the care you need, when you need it.  We recommend signing up for the patient portal called "MyChart".  Sign up information is provided on this After Visit Summary.  MyChart is used to connect with patients for Virtual Visits (Telemedicine).  Patients are able to view lab/test results, encounter notes, upcoming appointments, etc.  Non-urgent messages can be sent to your provider as well.   To learn more about what you can do with MyChart, go to [email or web address].    Your next appointment:   6 Weeks  The format for your next appointment:   In Person  Provider:   You may see Minus Breeding, MD or one of the following Advanced Practice Providers on your designated Care Team:    Rosaria Ferries, PA-C  Jory Sims, DNP, ANP  Cadence Kathlen Mody, [place]

## 2020-02-03 ENCOUNTER — Other Ambulatory Visit: Payer: Self-pay

## 2020-02-03 ENCOUNTER — Encounter: Payer: Self-pay | Admitting: Nurse Practitioner

## 2020-02-03 ENCOUNTER — Ambulatory Visit (INDEPENDENT_AMBULATORY_CARE_PROVIDER_SITE_OTHER): Payer: BC Managed Care – PPO | Admitting: Nurse Practitioner

## 2020-02-03 VITALS — BP 148/75 | HR 55 | Temp 98.4°F | Resp 20 | Ht 64.0 in | Wt 225.0 lb

## 2020-02-03 DIAGNOSIS — F3160 Bipolar disorder, current episode mixed, unspecified: Secondary | ICD-10-CM | POA: Diagnosis not present

## 2020-02-03 DIAGNOSIS — R300 Dysuria: Secondary | ICD-10-CM

## 2020-02-03 DIAGNOSIS — I10 Essential (primary) hypertension: Secondary | ICD-10-CM

## 2020-02-03 DIAGNOSIS — F5102 Adjustment insomnia: Secondary | ICD-10-CM

## 2020-02-03 DIAGNOSIS — I480 Paroxysmal atrial fibrillation: Secondary | ICD-10-CM | POA: Diagnosis not present

## 2020-02-03 DIAGNOSIS — C50411 Malignant neoplasm of upper-outer quadrant of right female breast: Secondary | ICD-10-CM

## 2020-02-03 DIAGNOSIS — E669 Obesity, unspecified: Secondary | ICD-10-CM

## 2020-02-03 DIAGNOSIS — Z170 Estrogen receptor positive status [ER+]: Secondary | ICD-10-CM

## 2020-02-03 DIAGNOSIS — K581 Irritable bowel syndrome with constipation: Secondary | ICD-10-CM | POA: Diagnosis not present

## 2020-02-03 DIAGNOSIS — K219 Gastro-esophageal reflux disease without esophagitis: Secondary | ICD-10-CM

## 2020-02-03 LAB — URINALYSIS, COMPLETE
Bilirubin, UA: NEGATIVE
Glucose, UA: NEGATIVE
Ketones, UA: NEGATIVE
Leukocytes,UA: NEGATIVE
Nitrite, UA: NEGATIVE
Protein,UA: NEGATIVE
Specific Gravity, UA: 1.01 (ref 1.005–1.030)
Urobilinogen, Ur: 0.2 mg/dL (ref 0.2–1.0)
pH, UA: 6.5 (ref 5.0–7.5)

## 2020-02-03 LAB — MICROSCOPIC EXAMINATION
Bacteria, UA: NONE SEEN
WBC, UA: NONE SEEN /hpf (ref 0–5)

## 2020-02-03 MED ORDER — LORAZEPAM 0.5 MG PO TABS: 0.5000 mg | ORAL_TABLET | Freq: Every evening | ORAL | 0 refills | Status: DC | PRN

## 2020-02-03 MED ORDER — GABAPENTIN 300 MG PO CAPS: 300.0000 mg | ORAL_CAPSULE | Freq: Three times a day (TID) | ORAL | 1 refills | Status: DC

## 2020-02-03 MED ORDER — ESOMEPRAZOLE MAGNESIUM 40 MG PO CPDR: 40.0000 mg | DELAYED_RELEASE_CAPSULE | Freq: Every day | ORAL | 1 refills | Status: DC

## 2020-02-03 MED ORDER — LAMOTRIGINE 100 MG PO TABS: 100.0000 mg | ORAL_TABLET | Freq: Three times a day (TID) | ORAL | 1 refills | Status: DC

## 2020-02-03 MED ORDER — VIIBRYD 40 MG PO TABS: 40.0000 mg | ORAL_TABLET | Freq: Every day | ORAL | 1 refills | Status: DC

## 2020-02-03 NOTE — Progress Notes (Signed)
Subjective:    Patient ID: Doris Smith, female    DOB: 1951-07-07, 69 y.o.   MRN: [id number]   Chief Complaint: Medical Management of Chronic Issues    HPI:  1. Dysuria Has been going on for several days. No frequency or urgency  2. Essential hypertension No c/o chest pain, sob or headache. Does not check blood pressure at home. BP Readings from Last 3 Encounters:  01/26/20 110/60  01/20/20 132/62  12/25/19 124/76     3. Paroxysmal atrial fibrillation (HCC) denies any palpitations or heart racing. Is on eliquis and denies any bleeding  4. Gastroesophageal reflux disease without esophagitis Is on nexium daily and is doing well.  5. Irritable bowel syndrome with constipation No recent flare ups  6. Bipolar affective disorder, current episode mixed, current episode severity unspecified ([name]) Is currently on viibryd and ativan. Says she feels that she is under good control. She has been thorough a lot with her caner treatments. Doe snot want to change meds at this time. Depression screen [facility] 2/9 02/03/2020 10/24/2019 05/15/2019  Decreased Interest 3 0 0  Down, Depressed, Hopeless 3 0 0  PHQ - 2 Score 6 0 0  Altered sleeping 1 - -  Tired, decreased energy 3 - -  Change in appetite 3 - -  Feeling bad or failure about yourself  3 - -  Trouble concentrating 0 - -  Moving slowly or fidgety/restless 0 - -  Suicidal thoughts 0 - -  PHQ-9 Score 16 - -  Difficult doing work/chores Not difficult at all - -  Some recent data might be hidden    7. Insomnia due to stress Is sleeping ok. Some nights she wake sup a lot.  8. Malignant neoplasm of upper-outer quadrant of right breast in female, estrogen receptor positive ([place]) Is through with chemo. Has follow up appointment in september  9. Obesity (BMI 30-39.9) No recent weight changes Wt Readings from Last 3 Encounters:  02/03/20 (!) 225 lb (102.1 kg)  01/26/20 223 lb 9.6 oz (101.4 kg)  01/20/20 225 lb 1.6 oz (102.1  kg)   BMI Readings from Last 3 Encounters:  02/03/20 38.62 kg/m  01/26/20 38.38 kg/m  01/20/20 38.64 kg/m       Outpatient Encounter Medications as of 02/03/2020  Medication Sig  . anastrozole (ARIMIDEX) 1 MG tablet Take 1 tablet (1 mg total) by mouth daily.  Marland Kitchen apixaban (ELIQUIS) 5 MG TABS tablet Take 1 tablet (5 mg total) by mouth 2 (two) times daily.  . benzonatate (TESSALON PERLES) 100 MG capsule Take 1 capsule (100 mg total) by mouth 3 (three) times daily as needed for cough.  . clotrimazole-betamethasone (LOTRISONE) cream APPLY  CREAM TOPICALLY TWICE DAILY  . esomeprazole (NEXIUM) 40 MG capsule Take 1 capsule by mouth once daily  . gabapentin (NEURONTIN) 300 MG capsule Take 300 mg by mouth 3 (three) times daily.  Marland Kitchen ibandronate (BONIVA) 150 MG tablet Take 1 tablet (150 mg total) by mouth every 30 (thirty) days. Take in the morning with a full glass of water, on an empty stomach, and do not take anything else by mouth or lie down for the next 30 min.  . lamoTRIgine (LAMICTAL) 100 MG tablet Take 100 mg by mouth in the morning, at noon, and at bedtime.  Marland Kitchen loratadine (CLARITIN) 10 MG tablet Take 10 mg by mouth daily.  Marland Kitchen LORazepam (ATIVAN) 0.5 MG tablet Take 1 tablet (0.5 mg total) by mouth at bedtime as needed (Nausea  or vomiting).  . metoprolol tartrate (LOPRESSOR) 25 MG tablet Take 1 tablet (25 mg total) by mouth 2 (two) times daily.  . midodrine (PROAMATINE) 2.5 MG tablet Take 1 tablet (2.5 mg total) by mouth 3 (three) times daily with meals.  . promethazine-dextromethorphan (PROMETHAZINE-DM) 6.25-15 MG/5ML syrup TAKE 5 MILLILITERS BY MOUTH 4 TIMES DAILY AS NEEDED FOR COUGH  . Vilazodone HCl (VIIBRYD) 40 MG TABS Take 40 mg by mouth daily.     Past Surgical History:  Procedure Laterality Date  . ABDOMINAL HYSTERECTOMY    . COLONOSCOPY    . DILATION AND CURETTAGE OF UTERUS  1981   abnormal pap  . KNEE ARTHROSCOPY Left 2007   x 2  . LUMBAR LAMINECTOMY/DECOMPRESSION  MICRODISCECTOMY  05/31/2011   Procedure: LUMBAR LAMINECTOMY/DECOMPRESSION MICRODISCECTOMY;  Surgeon: Johnn Hai;  [facility]: [facility];  Service: Orthopedics;  Laterality: Left;  Decompression Lumbar four to five and  Lumbar five to Sacral one on Left  (X-Ray)  . MASTECTOMY W/ SENTINEL NODE BIOPSY Right 09/16/2018   Procedure: RIGHT MASTECTOMY WITH RIGHT AXILLARY SENTINEL LYMPH NODE BIOPSY;  Surgeon: Alphonsa Overall, MD;  Location: [place];  Service: General;  Laterality: Right;  . PARTIAL HYSTERECTOMY  1982  . PORTACATH PLACEMENT Left 10/31/2018   Procedure: INSERTION PORT-A-CATH WITH ULTRASOUND;  Surgeon: Alphonsa Overall, MD;  Location: [name];  Service: General;  Laterality: Left;  . TONSILLECTOMY     as child  . TOTAL KNEE ARTHROPLASTY Left 12/18/2005    Family History  Problem Relation Age of Onset  . Anesthesia problems Mother   . Heart disease Mother        CHF, atrial fib  . Heart failure Mother   . Anesthesia problems Sister   . Colon polyps Father   . Heart disease Father        "Fluid around the heart"  . Hypertension Sister   . Breast cancer Maternal Aunt   . Colon cancer Maternal Aunt   . Prostate cancer Neg Hx   . Ovarian cancer Neg Hx     New complaints: None today  Social history: Lives with husband  Controlled substance contract: 02/03/20    Review of Systems  Constitutional: Negative for diaphoresis.  Eyes: Negative for pain.  Respiratory: Negative for shortness of breath.   Cardiovascular: Negative for chest pain, palpitations and leg swelling.  Gastrointestinal: Negative for abdominal pain.  Endocrine: Negative for polydipsia.  Musculoskeletal: Positive for back pain.  Skin: Negative for rash.  Neurological: Negative for dizziness, weakness and headaches.  Hematological: Does not bruise/bleed easily.  Psychiatric/Behavioral: Negative.   All other systems reviewed and are negative.      Objective:   Physical Exam Vitals and nursing  note reviewed.  Constitutional:      General: She is not in acute distress.    Appearance: Normal appearance. She is well-developed.  HENT:     Head: Normocephalic.     Nose: Nose normal.  Eyes:     Pupils: Pupils are equal, round, and reactive to light.  Neck:     Vascular: No carotid bruit or JVD.  Cardiovascular:     Rate and Rhythm: Normal rate and regular rhythm.     Heart sounds: Normal heart sounds.  Pulmonary:     Effort: Pulmonary effort is normal. No respiratory distress.     Breath sounds: Normal breath sounds. No wheezing or rales.  Chest:     Chest wall: No tenderness.     Breasts:  Right: Absent.        Left: Normal.  Abdominal:     General: Bowel sounds are normal. There is no distension or abdominal bruit.     Palpations: Abdomen is soft. There is no hepatomegaly, splenomegaly, mass or pulsatile mass.     Tenderness: There is no abdominal tenderness.  Musculoskeletal:        General: Normal range of motion.     Cervical back: Normal range of motion and neck supple.     Comments: Walking with cane  Lymphadenopathy:     Cervical: No cervical adenopathy.  Skin:    General: Skin is warm and dry.  Neurological:     Mental Status: She is alert and oriented to person, place, and time.     Deep Tendon Reflexes: Reflexes are normal and symmetric.  Psychiatric:        Behavior: Behavior normal.        Thought Content: Thought content normal.        Judgment: Judgment normal.     BP (!) 148/75   Pulse 55   Temp 98.4 F (36.9 C) (Temporal)   Resp 20   Ht '5\' 4"'  (1.626 m)   Wt (!) 225 lb (102.1 kg)   SpO2 96%   BMI 38.62 kg/m        Assessment & Plan:  .Erine Phenix Rovira comes in today with chief complaint of Medical Management of Chronic Issues   Diagnosis and orders addressed:  1. Dysuria Urine clear - Urinalysis, Complete  2. Essential hypertension Low sodium diet - CBC with Differential/Platelet - CMP14+EGFR - Lipid panel  3.  Paroxysmal atrial fibrillation (HCC) Avoid caffeine Continue eliquis  4. Gastroesophageal reflux disease without esophagitis Avoid spicy foods Do not eat 2 hours prior to bedtime - esomeprazole (NEXIUM) 40 MG capsule; Take 1 capsule (40 mg total) by mouth daily.  Dispense: 90 capsule; Refill: 1  5. Irritable bowel syndrome with constipation Watch diet to prevent flar eups  6. Bipolar affective disorder, current episode mixed, current episode severity unspecified ([place]) Stress management - lamoTRIgine (LAMICTAL) 100 MG tablet; Take 1 tablet (100 mg total) by mouth in the morning, at noon, and at bedtime.  Dispense: 180 tablet; Refill: 1 - Vilazodone HCl (VIIBRYD) 40 MG TABS; Take 1 tablet (40 mg total) by mouth daily.  Dispense: 90 tablet; Refill: 1  7. Insomnia due to stress Bedtime routine  8. Malignant neoplasm of upper-outer quadrant of right breast in female, estrogen receptor positive ([place]) Keep follow up with oncology - LORazepam (ATIVAN) 0.5 MG tablet; Take 1 tablet (0.5 mg total) by mouth at bedtime as needed (Nausea or vomiting).  Dispense: 30 tablet; Refill: 0  9. Obesity (BMI 30-39.9) Discussed diet and exercise for person with BMI >25 Will recheck weight in 3-6 months   Labs pending Health Maintenance reviewed Diet and exercise encouraged  Follow up plan: 6 months   [name] Done, FNP

## 2020-02-03 NOTE — Patient Instructions (Signed)

## 2020-02-04 LAB — CMP14+EGFR
ALT: 11 IU/L (ref 0–32)
AST: 15 IU/L (ref 0–40)
Albumin/Globulin Ratio: 1.8 (ref 1.2–2.2)
Albumin: 4.1 g/dL (ref 3.8–4.8)
Alkaline Phosphatase: 112 IU/L (ref 48–121)
BUN/Creatinine Ratio: 10 — ABNORMAL LOW (ref 12–28)
BUN: 9 mg/dL (ref 8–27)
Bilirubin Total: 0.4 mg/dL (ref 0.0–1.2)
CO2: 27 mmol/L (ref 20–29)
Calcium: 9 mg/dL (ref 8.7–10.3)
Chloride: 103 mmol/L (ref 96–106)
Creatinine, Ser: 0.86 mg/dL (ref 0.57–1.00)
GFR calc Af Amer: 81 mL/min/{1.73_m2} (ref >59)
GFR calc non Af Amer: 71 mL/min/{1.73_m2} (ref >59)
Globulin, Total: 2.3 g/dL (ref 1.5–4.5)
Glucose: 82 mg/dL (ref 65–99)
Potassium: 4.4 mmol/L (ref 3.5–5.2)
Sodium: 140 mmol/L (ref 134–144)
Total Protein: 6.4 g/dL (ref 6.0–8.5)

## 2020-02-04 LAB — CBC WITH DIFFERENTIAL/PLATELET
Basophils Absolute: 0.1 10*3/uL (ref 0.0–0.2)
Basos: 1 %
EOS (ABSOLUTE): 0.1 10*3/uL (ref 0.0–0.4)
Eos: 2 %
Hematocrit: 34.9 % (ref 34.0–46.6)
Hemoglobin: 11.9 g/dL (ref 11.1–15.9)
Immature Grans (Abs): 0 10*3/uL (ref 0.0–0.1)
Immature Granulocytes: 0 %
Lymphocytes Absolute: 0.7 10*3/uL (ref 0.7–3.1)
Lymphs: 13 %
MCH: 28.6 pg (ref 26.6–33.0)
MCHC: 34.1 g/dL (ref 31.5–35.7)
MCV: 84 fL (ref 79–97)
Monocytes Absolute: 0.8 10*3/uL (ref 0.1–0.9)
Monocytes: 14 %
Neutrophils Absolute: 3.8 10*3/uL (ref 1.4–7.0)
Neutrophils: 70 %
Platelets: 262 10*3/uL (ref 150–450)
RBC: 4.16 x10E6/uL (ref 3.77–5.28)
RDW: 14.7 % (ref 11.7–15.4)
WBC: 5.5 10*3/uL (ref 3.4–10.8)

## 2020-02-04 LAB — LIPID PANEL
Chol/HDL Ratio: 2.7 ratio (ref 0.0–4.4)
Cholesterol, Total: 203 mg/dL — ABNORMAL HIGH (ref 100–199)
HDL: 76 mg/dL (ref >39)
LDL Chol Calc (NIH): 106 mg/dL — ABNORMAL HIGH (ref 0–99)
Triglycerides: 121 mg/dL (ref 0–149)
VLDL Cholesterol Cal: 21 mg/dL (ref 5–40)

## 2020-02-10 DIAGNOSIS — F4310 Post-traumatic stress disorder, unspecified: Secondary | ICD-10-CM | POA: Diagnosis not present

## 2020-02-10 DIAGNOSIS — F410 Panic disorder [episodic paroxysmal anxiety] without agoraphobia: Secondary | ICD-10-CM | POA: Diagnosis not present

## 2020-02-10 DIAGNOSIS — F312 Bipolar disorder, current episode manic severe with psychotic features: Secondary | ICD-10-CM | POA: Diagnosis not present

## 2020-03-19 ENCOUNTER — Ambulatory Visit: Payer: BC Managed Care – PPO | Admitting: Physician Assistant

## 2020-03-24 DIAGNOSIS — R42 Dizziness and giddiness: Secondary | ICD-10-CM | POA: Insufficient documentation

## 2020-03-24 NOTE — Progress Notes (Signed)
Cardiology Office Note   Date:  03/25/2020   ID:  Doris Smith, DOB May 03, 1951, MRN [id number]  PCP:  Chevis Pretty, FNP  Cardiologist:   Jory Sims, NP   Chief Complaint  Patient presents with  . Fatigue      History of Present Illness: Doris Hoelzel is a 69 y.o. female who presents for ongoing assessment and management of palpitations, prior history of atrial fib. She was in the [facility] ED in March of this year with atrial fib with RVR. Of note in May she had an echo which demonstrated no significant abnormalities.  An event monitor demonstrated no arrhythmias.   She was previously feeling very dizzy with drops in blood pressure and was started on midodrine.  She was started on Eliquis although she took this hesitantly and we did have to go over the risks benefits extensively in a July appointment.  She also had some slow sinus rates and so her beta-blocker was reduced.  She returns for follow-up.  She has had increased fatigue.  She is upset because she has had weight gain.  She really has problems with depression and is not well managed.  She had some palpitations but not sustained.  She has had some dizziness but no orthostatic presyncope or syncopal episodes.  She is taking the medications as listed.   Past Medical History:  Diagnosis Date  . A-fib ([place]) 11/2012   PAF  . Anxiety    takes Ativan  . Asthma 04/07/2011   dx  . Bipolar disorder ([place])   . Cancer [facility])    Right breast  . Depression   . Early cataracts, bilateral   . Fatty liver   . Fibromyalgia   . GERD (gastroesophageal reflux disease)   . Irritable bowel syndrome   . Mental disorder    dx bipolar  . PONV (postoperative nausea and vomiting)   . Sleep apnea 04/2011    does not wear CPAP    Past Surgical History:  Procedure Laterality Date  . ABDOMINAL HYSTERECTOMY    . COLONOSCOPY    . DILATION AND CURETTAGE OF UTERUS  1981   abnormal pap  . KNEE ARTHROSCOPY Left 2007    x 2  . LUMBAR LAMINECTOMY/DECOMPRESSION MICRODISCECTOMY  05/31/2011   Procedure: LUMBAR LAMINECTOMY/DECOMPRESSION MICRODISCECTOMY;  Surgeon: Johnn Hai;  Location: [facility];  Service: Orthopedics;  Laterality: Left;  Decompression Lumbar four to five and  Lumbar five to Sacral one on Left  (X-Ray)  . MASTECTOMY W/ SENTINEL NODE BIOPSY Right 09/16/2018   Procedure: RIGHT MASTECTOMY WITH RIGHT AXILLARY SENTINEL LYMPH NODE BIOPSY;  Surgeon: Alphonsa Overall, MD;  Location: [facility];  Service: General;  Laterality: Right;  . PARTIAL HYSTERECTOMY  1982  . PORTACATH PLACEMENT Left 10/31/2018   Procedure: INSERTION PORT-A-CATH WITH ULTRASOUND;  Surgeon: Alphonsa Overall, MD;  Location: [facility];  Service: General;  Laterality: Left;  . TONSILLECTOMY     as child  . TOTAL KNEE ARTHROPLASTY Left 12/18/2005     Current Outpatient Medications  Medication Sig Dispense Refill  . anastrozole (ARIMIDEX) 1 MG tablet Take 1 tablet (1 mg total) by mouth daily. 90 tablet 3  . apixaban (ELIQUIS) 5 MG TABS tablet Take 1 tablet (5 mg total) by mouth 2 (two) times daily. 180 tablet 3  . benzonatate (TESSALON PERLES) 100 MG capsule Take 1 capsule (100 mg total) by mouth 3 (three) times daily as needed for cough. 20 capsule 0  .  clotrimazole-betamethasone (LOTRISONE) cream APPLY  CREAM TOPICALLY TWICE DAILY 45 g 0  . esomeprazole (NEXIUM) 40 MG capsule Take 1 capsule (40 mg total) by mouth daily. 90 capsule 1  . gabapentin (NEURONTIN) 300 MG capsule Take 1 capsule (300 mg total) by mouth 3 (three) times daily. 180 capsule 1  . ibandronate (BONIVA) 150 MG tablet Take 1 tablet (150 mg total) by mouth every 30 (thirty) days. Take in the morning with a full glass of water, on an empty stomach, and do not take anything else by mouth or lie down for the next 30 min. 3 tablet 4  . lamoTRIgine (LAMICTAL) 100 MG tablet Take 1 tablet (100 mg total) by mouth in the morning, at noon, and at bedtime. 180 tablet 1  .  loratadine (CLARITIN) 10 MG tablet Take 10 mg by mouth daily.    Marland Kitchen LORazepam (ATIVAN) 0.5 MG tablet Take 1 tablet (0.5 mg total) by mouth at bedtime as needed (Nausea or vomiting). 30 tablet 0  . metoprolol tartrate (LOPRESSOR) 25 MG tablet Take 1 tablet (25 mg total) by mouth 2 (two) times daily. 180 tablet 3  . midodrine (PROAMATINE) 2.5 MG tablet Take 1 tablet (2.5 mg total) by mouth 3 (three) times daily with meals. 270 tablet 3  . promethazine-dextromethorphan (PROMETHAZINE-DM) 6.25-15 MG/5ML syrup TAKE 5 MILLILITERS BY MOUTH 4 TIMES DAILY AS NEEDED FOR COUGH    . Vilazodone HCl (VIIBRYD) 40 MG TABS Take 1 tablet (40 mg total) by mouth daily. 90 tablet 1   No current facility-administered medications for this visit.   Facility-Administered Medications Ordered in Other Visits  Medication Dose Route Frequency Provider Last Rate Last Admin  . heparin lock flush 100 unit/mL  500 Units Intravenous Once Magrinat, Virgie Dad, MD      . sodium chloride flush (NS) 0.9 % injection 10 mL  10 mL Intravenous PRN Magrinat, Virgie Dad, MD        Allergies:   Iohexol, Codeine, Palonosetron, Prednisone, Sulfonamide derivatives, Celecoxib, Sulfa antibiotics, and Vitamin b12    ROS:  Please see the history of present illness.   Othzerwise, review of systems are positive for none.   All other systems are reviewed and negative.    PHYSICAL EXAM: VS:  BP 127/73   Pulse 60   Temp (!) 96.8 F (36 C)   Ht 5\' 4"  (1.626 m)   Wt 228 lb (103.4 kg)   SpO2 98%   BMI 39.14 kg/m  , BMI Body mass index is 39.14 kg/m. GENERAL:  Well appearing NECK:  No jugular venous distention, waveform within normal limits, carotid upstroke brisk and symmetric, no bruits, no thyromegaly LUNGS:  Clear to auscultation bilaterally CHEST:  Unremarkable HEART:  PMI not displaced or sustained,S1 and S2 within normal limits, no S3, no S4, no clicks, no rubs, no murmurs ABD:  Flat, positive bowel sounds normal in frequency in pitch, no  bruits, no rebound, no guarding, no midline pulsatile mass, no hepatomegaly, no splenomegaly EXT:  2 plus pulses throughout, no edema, no cyanosis no clubbing   EKG:  EKG is  ordered today. The ekg ordered today demonstrates sinus bradycardia, rate 60, axis within normal limits, intervals within normal limits, no acute ST-T wave changes.   Recent Labs: 09/05/2019: BNP 107.1 01/26/2020: TSH 3.260 02/03/2020: ALT 11; BUN 9; Creatinine, Ser 0.86; Hemoglobin 11.9; Platelets 262; Potassium 4.4; Sodium 140    Lipid Panel    Component Value Date/Time   CHOL 203 (H) 02/03/2020 1049  TRIG 121 02/03/2020 1049   TRIG 114 11/03/2014 1119   HDL 76 02/03/2020 1049   HDL 82 11/03/2014 1119   CHOLHDL 2.7 02/03/2020 1049   LDLCALC 106 (H) 02/03/2020 1049   LDLCALC 94 10/01/2013 0926      Wt Readings from Last 3 Encounters:  03/25/20 228 lb (103.4 kg)  02/03/20 (!) 225 lb (102.1 kg)  01/26/20 223 lb 9.6 oz (101.4 kg)      Other studies Reviewed: Additional studies/ records that were reviewed today include: Labs. Review of the above records demonstrates:  Please see elsewhere in the note.     ASSESSMENT AND PLAN:  ATRIAL FIB:  Ms. Dejanique Ruehl Komar has a CHA2DS2 - VASc score of 2.    She is having rare paroxysms likely and tolerating anticoagulation.  No change in therapy.  DIZZINESS:   This is more severe than previous.  She will continue with the current dose of midodrine.   FATIGUE: She has untreated sleep apnea.  She states she could not wear the mask that was prescribed years ago.  She does have a sleep doctor apparently in [facility] and I suggested she follow-up and see if there is any other therapies that they might consider.  I do not think the reducing the beta-blocker will substantially improve this.  Much of this is probably related to depression.  COVID EDUCATION:  She has had her vaccine.    Current medicines are reviewed at length with the patient today.  The patient does  not have concerns regarding medicines.  The following changes have been made:  no change  Labs/ tests ordered today include:   Orders Placed This Encounter  Procedures  . EKG 12-Lead     Disposition:   FU with me in one year.    Signed, Minus Breeding, MD  03/25/2020 11:33 AM    [facility]

## 2020-03-25 ENCOUNTER — Other Ambulatory Visit: Payer: Self-pay

## 2020-03-25 ENCOUNTER — Ambulatory Visit (INDEPENDENT_AMBULATORY_CARE_PROVIDER_SITE_OTHER): Payer: BC Managed Care – PPO | Admitting: Cardiology

## 2020-03-25 ENCOUNTER — Encounter: Payer: Self-pay | Admitting: Cardiology

## 2020-03-25 VITALS — BP 127/73 | HR 60 | Temp 96.8°F | Ht 64.0 in | Wt 228.0 lb

## 2020-03-25 DIAGNOSIS — R42 Dizziness and giddiness: Secondary | ICD-10-CM | POA: Diagnosis not present

## 2020-03-25 DIAGNOSIS — I480 Paroxysmal atrial fibrillation: Secondary | ICD-10-CM | POA: Diagnosis not present

## 2020-03-25 NOTE — Patient Instructions (Signed)
Medication Instructions:  Your physician recommends that you continue on your current medications as directed. Please refer to the Current Medication list given to you today.  *If you need a refill on your cardiac medications before your next appointment, please call your pharmacy*   Follow-Up: At [facility], you and your health needs are our priority.  As part of our continuing mission to provide you with exceptional heart care, we have created designated Provider Care Teams.  These Care Teams include your primary Cardiologist (physician) and Advanced Practice Providers (APPs -  Physician Assistants and Nurse Practitioners) who all work together to provide you with the care you need, when you need it.  We recommend signing up for the patient portal called "MyChart".  Sign up information is provided on this After Visit Summary.  MyChart is used to connect with patients for Virtual Visits (Telemedicine).  Patients are able to view lab/test results, encounter notes, upcoming appointments, etc.  Non-urgent messages can be sent to your provider as well.   To learn more about what you can do with MyChart, go to [email or web address].    Your next appointment:   12 month(s)  The format for your next appointment:   In Person  Provider:   [name] Hochrein, MD 

## 2020-03-29 ENCOUNTER — Other Ambulatory Visit: Payer: Self-pay | Admitting: Nurse Practitioner

## 2020-03-29 DIAGNOSIS — K219 Gastro-esophageal reflux disease without esophagitis: Secondary | ICD-10-CM

## 2020-04-01 ENCOUNTER — Encounter: Payer: Self-pay | Admitting: Nurse Practitioner

## 2020-04-01 ENCOUNTER — Other Ambulatory Visit: Payer: Self-pay

## 2020-04-01 ENCOUNTER — Ambulatory Visit: Payer: BC Managed Care – PPO | Admitting: Nurse Practitioner

## 2020-04-01 VITALS — BP 159/67 | HR 57 | Temp 97.9°F | Resp 20 | Ht 64.0 in | Wt 228.0 lb

## 2020-04-01 DIAGNOSIS — Z23 Encounter for immunization: Secondary | ICD-10-CM

## 2020-04-01 DIAGNOSIS — G8929 Other chronic pain: Secondary | ICD-10-CM | POA: Diagnosis not present

## 2020-04-01 DIAGNOSIS — M25561 Pain in right knee: Secondary | ICD-10-CM

## 2020-04-01 MED ORDER — BUPIVACAINE HCL 0.25 % IJ SOLN
1.0000 mL | Freq: Once | INTRAMUSCULAR | Status: AC
  Administered 2020-04-01: 1 mL via INTRA_ARTICULAR

## 2020-04-01 MED ORDER — METHYLPREDNISOLONE ACETATE 40 MG/ML IJ SUSP
40.0000 mg | Freq: Once | INTRAMUSCULAR | Status: AC
  Administered 2020-04-01: 40 mg via INTRAMUSCULAR

## 2020-04-01 NOTE — Patient Instructions (Signed)

## 2020-04-01 NOTE — Progress Notes (Signed)
Subjective:    Patient ID: Doris Smith, female    DOB: 01-Mar-1951, 69 y.o.   MRN: [id number]   Chief Complaint: Wants injection in right knee   HPI Pt c/o chronic right knee pain involving the entire anterior portion of right knee, predominately medial to patella. Onset was ~ 2 years ago due to frequent falls. Inciting event: worse since breast cancer dx, pain is constant during rest or exertion. Current symptoms include: "severe hurting pain". Pain is aggravated by movement/overuse and also c/o stiffness upon waking. Patient has had prior knee problems, left knee has been replaced. 10/10 pain. Requesting steriod injection.   Review of Systems  Constitutional: Negative.   HENT: Negative.   Eyes: Negative.   Cardiovascular: Negative.   Gastrointestinal: Negative.   Endocrine: Negative.   Genitourinary: Negative.   Musculoskeletal: Positive for gait problem.  Skin: Negative.   Allergic/Immunologic: Negative.   Hematological: Negative.   Psychiatric/Behavioral: Negative.   All other systems reviewed and are negative.      Objective:   Physical Exam Vitals and nursing note reviewed.  Constitutional:      Appearance: Normal appearance.  HENT:     Head: Normocephalic and atraumatic.     Right Ear: External ear normal.     Left Ear: External ear normal.     Nose: Nose normal.     Mouth/Throat:     Mouth: Mucous membranes are moist.     Pharynx: Oropharynx is clear.  Eyes:     Extraocular Movements: Extraocular movements intact.     Conjunctiva/sclera: Conjunctivae normal.     Pupils: Pupils are equal, round, and reactive to light.  Cardiovascular:     Rate and Rhythm: Normal rate and regular rhythm.  Pulmonary:     Effort: Pulmonary effort is normal.     Breath sounds: Normal breath sounds.  Abdominal:     General: Bowel sounds are normal.  Musculoskeletal:     Cervical back: Normal range of motion.     Right knee: Decreased range of motion. Tenderness present.      Left knee: Normal.  Skin:    General: Skin is warm and dry.  Neurological:     General: No focal deficit present.     Mental Status: She is alert and oriented to person, place, and time. Mental status is at baseline.  Psychiatric:        Mood and Affect: Mood normal.        Behavior: Behavior normal.        Thought Content: Thought content normal.        Judgment: Judgment normal.   Joint Injection/Arthrocentesis  Date/Time: 04/01/2020 12:43 PM Performed by: Chevis Pretty, FNP Authorized by: Hassell Done [name], FNP  Indications: pain  Body area: knee Local anesthesia used: no  Anesthesia: Local anesthesia used: no  Sedation: Patient sedated: no  Preparation: Patient was prepped and draped in the usual sterile fashion. Needle size: 22 G Ultrasound guidance: no Approach: medial Methylprednisolone amount: 40 mg Bupivacaine 0.25% amount: 1 mL Patient tolerance: patient tolerated the procedure well with no immediate complications      BP (!) 159/67   Pulse (!) 57   Temp 97.9 F (36.6 C) (Temporal)   Resp 20   Ht 5\' 4"  (1.626 m)   Wt 228 lb (103.4 kg)   SpO2 98%   BMI 39.14 kg/m      Assessment & Plan:  Doris Smith in today with chief complaint of Wants  injection in right knee   1. Chronic pain of right knee Ice BID Rest RTO prn    The above assessment and management plan was discussed with the patient. The patient verbalized understanding of and has agreed to the management plan. Patient is aware to call the clinic if symptoms persist or worsen. Patient is aware when to return to the clinic for a follow-up visit. Patient educated on when it is appropriate to go to the emergency department.   [name] Done, FNP

## 2020-04-13 ENCOUNTER — Telehealth: Payer: Self-pay

## 2020-04-13 NOTE — Telephone Encounter (Signed)
   Primary Cardiologist: Jory Sims, NP  Chart reviewed as part of pre-operative protocol coverage.   Simple dental extractions (1-2 teeth) are considered low risk procedures per guidelines and generally do not require any specific cardiac clearance. It is also generally accepted that for simple extractions and dental cleanings, there is no need to interrupt blood thinner therapy.   SBE prophylaxis is not required for the patient from a cardiac standpoint.  I will route this recommendation to the requesting party via Epic fax function and remove from pre-op pool.  Please call with questions.  Tami Lin Sherise Geerdes, PA 04/13/2020, 3:51 PM

## 2020-04-13 NOTE — Telephone Encounter (Signed)
   [facility] Pre-operative Risk Assessment     Request for surgical clearance:  1. What type of surgery is being performed? Teeth Extraction- tooth 30 and 31 (2 in total)   2. When is this surgery scheduled? TBD  3. What type of clearance is required (medical clearance vs. Pharmacy clearance to hold med vs. Both)? BOTH  4. Are there any medications that need to be held prior to surgery and how long? Eliquis, can it be stopped 2 days prior, can it be restarted after?. If unable to hold eliquis can refer to oral surgeon  5. Practice name and name of physician performing surgery? Celine Ahr. [facility] DDS   6. What is the office phone number? [phone]   7.   What is the office fax number? [phone]  8.   Anesthesia type (None, local, MAC, general) ? Local Anesthetic with or without epinephrine     Doris Smith Doris Smith 04/13/2020, 3:33 PM  _________________________________________________________________   (provider comments below)

## 2020-04-14 ENCOUNTER — Telehealth: Payer: Self-pay

## 2020-04-14 NOTE — Telephone Encounter (Signed)
RN received call from the office of Celine Ahr. Hill, DDS, PA inquiring about clearance to have teeth extraction.   Pt currently under bisphosphonate therapy, Boniva.   RN reviewed with MD - MD recommendations to only have teeth extraction if unavoidable.  If unavoidable, patient will stop Boniva, and not resume for an additional 3 months.    RN notified office via fax successfully of recommendations.

## 2020-04-20 DIAGNOSIS — F4310 Post-traumatic stress disorder, unspecified: Secondary | ICD-10-CM | POA: Diagnosis not present

## 2020-04-20 DIAGNOSIS — F410 Panic disorder [episodic paroxysmal anxiety] without agoraphobia: Secondary | ICD-10-CM | POA: Diagnosis not present

## 2020-04-20 DIAGNOSIS — F312 Bipolar disorder, current episode manic severe with psychotic features: Secondary | ICD-10-CM | POA: Diagnosis not present

## 2020-04-26 ENCOUNTER — Telehealth: Payer: Self-pay | Admitting: Cardiology

## 2020-04-26 NOTE — Telephone Encounter (Signed)
Spoke to patient she stated she has been having dizzy spells.Stated she staggers when she walks.She is afraid she is going to fall.Stated she has felt worse since this past Friday.She does not check her B/P.Appointment scheduled with Coletta Memos NP 10/19 at 10:15 am.Advised to bring a list of all medications.

## 2020-04-26 NOTE — Telephone Encounter (Signed)
STAT if patient feels like he/she is going to faint   1) Are you dizzy now? Yes   2) Do you feel faint or have you passed out? No   3) Do you have any other symptoms? Sweating   4) Have you checked your HR and BP (record if available)? No    Patient states she assumes midodrine (PROAMATINE) 2.5 MG tablet is the cause of the dizziness.

## 2020-04-26 NOTE — Progress Notes (Signed)
Cardiology Clinic Note   Patient Name: Doris Smith Date of Encounter: 04/27/2020  Primary Care Provider:  Chevis Pretty, [place] Primary Cardiologist:  Jory Sims, NP  Patient Profile    Doris Smith. Street 69 year old female presents to the clinic today for an evaluation of her dizziness.  Past Medical History    Past Medical History:  Diagnosis Date  . A-fib ([place]) 11/2012   PAF  . Anxiety    takes Ativan  . Asthma 04/07/2011   dx  . Bipolar disorder ([place])   . Cancer [facility])    Right breast  . Depression   . Early cataracts, bilateral   . Fatty liver   . Fibromyalgia   . GERD (gastroesophageal reflux disease)   . Irritable bowel syndrome   . Mental disorder    dx bipolar  . PONV (postoperative nausea and vomiting)   . Sleep apnea 04/2011    does not wear CPAP   Past Surgical History:  Procedure Laterality Date  . ABDOMINAL HYSTERECTOMY    . COLONOSCOPY    . DILATION AND CURETTAGE OF UTERUS  1981   abnormal pap  . KNEE ARTHROSCOPY Left 2007   x 2  . LUMBAR LAMINECTOMY/DECOMPRESSION MICRODISCECTOMY  05/31/2011   Procedure: LUMBAR LAMINECTOMY/DECOMPRESSION MICRODISCECTOMY;  Surgeon: Johnn Hai;  Location: [facility];  Service: Orthopedics;  Laterality: Left;  Decompression Lumbar four to five and  Lumbar five to Sacral one on Left  (X-Ray)  . MASTECTOMY W/ SENTINEL NODE BIOPSY Right 09/16/2018   Procedure: RIGHT MASTECTOMY WITH RIGHT AXILLARY SENTINEL LYMPH NODE BIOPSY;  Surgeon: Alphonsa Overall, MD;  Location: [facility];  Service: General;  Laterality: Right;  . PARTIAL HYSTERECTOMY  1982  . PORTACATH PLACEMENT Left 10/31/2018   Procedure: INSERTION PORT-A-CATH WITH ULTRASOUND;  Surgeon: Alphonsa Overall, MD;  Location: [facility];  Service: General;  Laterality: Left;  . TONSILLECTOMY     as child  . TOTAL KNEE ARTHROPLASTY Left 12/18/2005    Allergies  Allergies  Allergen Reactions  . Iohexol     "over 20 years ago" had reaction  "hurting in arm and heart"-Done in [place], [place].Mar[place] Kitchenphysician stopped CT at that time.  . Codeine Other (See Comments)    Makes feel like she is wild   . Palonosetron Other (See Comments)    headache  . Prednisone Other (See Comments)    Makes me very irritable  . Sulfonamide Derivatives Other (See Comments)    Upset stomach  . Celecoxib Nausea And Vomiting  . Sulfa Antibiotics Nausea And Vomiting  . Vitamin B12 Rash    History of Present Illness    Doris Smith has a PMH of essential hypertension, A. fib, aortic atherosclerosis, IBS, GERD, bipolar disorder, obesity malignant neoplasm of her right breast.  She was seen in the [facility] emergency department 3/21 with atrial fibrillation RVR.  Her echocardiogram May 2021 showed no significant abnormalities.  An event monitor showed no arrhythmias.  She previously indicated that she would feel very dizzy with drops in her blood pressure and was started on midodrine.  She was also started on Eliquis.  Initially she was hesitant about starting Eliquis and the risks and benefits were reviewed at her July appointment.  She was noted to have some slow sinus rates and her beta-blocker was decreased.  She was last seen in my clinic by Dr. Percival Spanish on 03/25/2020 with complaints of fatigue.  She was also noted to have weight gain and difficulties managing her  depression.  She was noted to have some palpitations that were not sustained.  She continued to have some dizziness orthostatic presyncope or syncopal episodes.  She presents to the clinic today for follow-up evaluation and states she has had a very hard time dealing with cancer and her cancer treatment.  She is tearful in the office stating that she feels like she has several health problems.  She indicates that she had an episode of atrial fibrillation last night.  This made her short of breath and resolved on its own.  She is limited in her physical activity due to her bilateral knee pain.  She states that  she is trying to work on her diet so that she may lose weight.  I reassured her that her EKG showed normal sinus rhythm and that she was on the appropriate medications for her heart.  I will give her the salty 6 diet sheet, have her increase her physical activity as tolerated, and have her follow-up in 6 months.  Today she denies chest pain, shortness of breath, lower extremity edema, increased fatigue,  melena, hematuria, hemoptysis, diaphoresis, weakness, presyncope, syncope, orthopnea, and PND.  Home Medications    Prior to Admission medications   Medication Sig Start Date End Date Taking? Authorizing Provider  anastrozole (ARIMIDEX) 1 MG tablet Take 1 tablet (1 mg total) by mouth daily. 04/23/19   Gardenia Phlegm, NP  apixaban (ELIQUIS) 5 MG TABS tablet Take 1 tablet (5 mg total) by mouth 2 (two) times daily. 01/26/20   Lendon Colonel, NP  benzonatate (TESSALON PERLES) 100 MG capsule Take 1 capsule (100 mg total) by mouth 3 (three) times daily as needed for cough. 10/21/18   Chevis Pretty, FNP  clotrimazole-betamethasone (LOTRISONE) cream APPLY  CREAM TOPICALLY TWICE DAILY 10/02/19   Chevis Pretty, FNP  esomeprazole (NEXIUM) 40 MG capsule Take 1 capsule by mouth once daily 03/29/20   Chevis Pretty, FNP  gabapentin (NEURONTIN) 300 MG capsule Take 1 capsule (300 mg total) by mouth 3 (three) times daily. 02/03/20   Hassell Done, [name], FNP  ibandronate (BONIVA) 150 MG tablet Take 1 tablet (150 mg total) by mouth every 30 (thirty) days. Take in the morning with a full glass of water, on an empty stomach, and do not take anything else by mouth or lie down for the next 30 min. 07/23/19   Magrinat, Virgie Dad, MD  lamoTRIgine (LAMICTAL) 100 MG tablet Take 1 tablet (100 mg total) by mouth in the morning, at noon, and at bedtime. 02/03/20   Hassell Done, [name], FNP  loratadine (CLARITIN) 10 MG tablet Take 10 mg by mouth daily.    [provider]  LORazepam  (ATIVAN) 0.5 MG tablet Take 1 tablet (0.5 mg total) by mouth at bedtime as needed (Nausea or vomiting). 02/03/20   Hassell Done [name], FNP  metoprolol tartrate (LOPRESSOR) 25 MG tablet Take 1 tablet (25 mg total) by mouth 2 (two) times daily. 01/26/20 04/25/20  Lendon Colonel, NP  midodrine (PROAMATINE) 2.5 MG tablet Take 1 tablet (2.5 mg total) by mouth 3 (three) times daily with meals. 12/25/19   Minus Breeding, MD  promethazine-dextromethorphan (PROMETHAZINE-DM) 6.25-15 MG/5ML syrup TAKE 5 MILLILITERS BY MOUTH 4 TIMES DAILY AS NEEDED FOR COUGH 02/13/19   [provider]  Vilazodone HCl (VIIBRYD) 40 MG TABS Take 1 tablet (40 mg total) by mouth daily. 02/03/20   Chevis Pretty, FNP    Family History    Family History  Problem Relation Age of Onset  .  Anesthesia problems Mother   . Heart disease Mother        CHF, atrial fib  . Heart failure Mother   . Anesthesia problems Sister   . Colon polyps Father   . Heart disease Father        "Fluid around the heart"  . Hypertension Sister   . Breast cancer Maternal Aunt   . Colon cancer Maternal Aunt   . Prostate cancer Neg Hx   . Ovarian cancer Neg Hx    She indicated that her mother is alive. She indicated that her father is alive. She indicated that all of her three sisters are alive. She indicated that both of her sons are alive. She indicated that the status of her neg hx is unknown.  Social History    Social History   Socioeconomic History  . Marital status: Married    Spouse name: [name]  . Number of children: 2  . Years of education: 65  . Highest education level: Not on file  Occupational History  . Occupation: Disabled  Tobacco Use  . Smoking status: Never Smoker  . Smokeless tobacco: Never Used  Vaping Use  . Vaping Use: Never used  Substance and Sexual Activity  . Alcohol use: No  . Drug use: No  . Sexual activity: Yes    Birth control/protection: Post-menopausal, Surgical  Other Topics Concern    . Not on file  Social History Narrative   Tea daily.  Rarely has caffeine    Social Determinants of Health   Financial Resource Strain:   . Difficulty of Paying Living Expenses: Not on file  Food Insecurity:   . Worried About Charity fundraiser in the Last Year: Not on file  . Ran Out of Food in the Last Year: Not on file  Transportation Needs:   . Lack of Transportation (Medical): Not on file  . Lack of Transportation (Non-Medical): Not on file  Physical Activity:   . Days of Exercise per Week: Not on file  . Minutes of Exercise per Session: Not on file  Stress:   . Feeling of Stress : Not on file  Social Connections:   . Frequency of Communication with Friends and Family: Not on file  . Frequency of Social Gatherings with Friends and Family: Not on file  . Attends Religious Services: Not on file  . Active Member of Clubs or Organizations: Not on file  . Attends Archivist Meetings: Not on file  . Marital Status: Not on file  Intimate Partner Violence:   . Fear of Current or Ex-Partner: Not on file  . Emotionally Abused: Not on file  . Physically Abused: Not on file  . Sexually Abused: Not on file     Review of Systems    General:  No chills, fever, night sweats or weight changes.  Cardiovascular:  No chest pain, dyspnea on exertion, edema, orthopnea, palpitations, paroxysmal nocturnal dyspnea. Dermatological: No rash, lesions/masses Respiratory: No cough, dyspnea Urologic: No hematuria, dysuria Abdominal:   No nausea, vomiting, diarrhea, bright red blood per rectum, melena, or hematemesis Neurologic:  No visual changes, wkns, changes in mental status. All other systems reviewed and are otherwise negative except as noted above.  Physical Exam    VS:  BP (!) 142/72   Pulse 61   Ht 5\' 4"  (1.626 m)   Wt 226 lb 9.6 oz (102.8 kg)   BMI 38.90 kg/m  , BMI Body mass index is 38.9 kg/m. GEN:  Well nourished, well developed, in no acute distress. HEENT:  normal. Neck: Supple, no JVD, carotid bruits, or masses. Cardiac: RRR, no murmurs, rubs, or gallops. No clubbing, cyanosis, edema.  Radials/DP/PT 2+ and equal bilaterally.  Respiratory:  Respirations regular and unlabored, clear to auscultation bilaterally. GI: Soft, nontender, nondistended, BS + x 4. MS: no deformity or atrophy. Skin: warm and dry, no rash. Neuro:  Strength and sensation are intact. Psych: Normal affect.  Accessory Clinical Findings    Recent Labs: 09/05/2019: BNP 107.1 01/26/2020: TSH 3.260 02/03/2020: ALT 11; BUN 9; Creatinine, Ser 0.86; Hemoglobin 11.9; Platelets 262; Potassium 4.4; Sodium 140   Recent Lipid Panel    Component Value Date/Time   CHOL 203 (H) 02/03/2020 1049   TRIG 121 02/03/2020 1049   TRIG 114 11/03/2014 1119   HDL 76 02/03/2020 1049   HDL 82 11/03/2014 1119   CHOLHDL 2.7 02/03/2020 1049   LDLCALC 106 (H) 02/03/2020 1049   LDLCALC 94 10/01/2013 0926    ECG personally reviewed by me today-normal sinus rhythm 61 bpm no ST or T wave deviation- No acute changes  Echocardiogram 12/02/2019 IMPRESSIONS    1. Normal LV function; trace AI; GLS -24.9%.  2. Left ventricular ejection fraction, by estimation, is 60 to 65%. The  left ventricle has normal function. The left ventricle has no regional  wall motion abnormalities. Left ventricular diastolic parameters were  normal.  3. Right ventricular systolic function is normal. The right ventricular  size is normal. There is normal pulmonary artery systolic pressure.  4. The mitral valve is normal in structure. Trivial mitral valve  regurgitation. No evidence of mitral stenosis.  5. The aortic valve is tricuspid. Aortic valve regurgitation is trivial.  No aortic stenosis is present.  6. The inferior vena cava is normal in size with greater than 50%  respiratory variability, suggesting right atrial pressure of 3 mmHg.   Assessment & Plan   1.  Atrial fibrillation-heart rate today 61.   Infrequent episodes of atrial fibrillation nonsustained.  No increased DOE or activity intolerance today. Continue metoprolol, Eliquis, Heart healthy diet Increase physical activity as tolerated Avoid triggers caffeine, chocolate, EtOH etc.  Dizziness-continues to have intermittent episodes of dizziness.  Has had some improvement with midodrine.  She continues to use her cane with walking. Continue midodrine Heart healthy diet Raise from sitting to standing position slowly Lower extremity support stockings  Fatigue-unchanged from previous visit.  Noted to have untreated OSA.  Was not able to tolerate facemask.  Continues with sleep doctor in [facility].  Felt to also be related to her depression. Follow-up with sleep medicine.  Disposition: Follow-up with Dr. Percival Spanish in 6 months.   Jossie Ng. Nitasha Jewel NP-C    04/27/2020, 10:53 AM [place] [address] Office [phone] Fax [phone]  Notice: This dictation was prepared with Dragon dictation along with smaller phrase technology. Any transcriptional errors that result from this process are unintentional and may not be corrected upon review.

## 2020-04-27 ENCOUNTER — Other Ambulatory Visit: Payer: Self-pay

## 2020-04-27 ENCOUNTER — Ambulatory Visit (INDEPENDENT_AMBULATORY_CARE_PROVIDER_SITE_OTHER): Payer: BC Managed Care – PPO | Admitting: General Practice

## 2020-04-27 ENCOUNTER — Encounter: Payer: Self-pay | Admitting: General Practice

## 2020-04-27 VITALS — BP 142/72 | HR 61 | Ht 64.0 in | Wt 226.6 lb

## 2020-04-27 DIAGNOSIS — R42 Dizziness and giddiness: Secondary | ICD-10-CM

## 2020-04-27 DIAGNOSIS — R5383 Other fatigue: Secondary | ICD-10-CM | POA: Diagnosis not present

## 2020-04-27 DIAGNOSIS — I480 Paroxysmal atrial fibrillation: Secondary | ICD-10-CM | POA: Diagnosis not present

## 2020-04-27 NOTE — Patient Instructions (Signed)
Medication Instructions:  The current medical regimen is effective;  continue present plan and medications as directed. Please refer to the Current Medication list given to you today. *If you need a refill on your cardiac medications before your next appointment, please call your pharmacy*  Lab Work:   Testing/Procedures:  NONE    NONE  Special Instructions PLEASE READ AND FOLLOW SALTY 6-ATTACHED  PLEASE INCREASE PHYSICAL ACTIVITY AS TOLERATED  Follow-Up: Your next appointment:  6 month(s) In Person with Minus Breeding, MD -[place], FNP-C   At [facility], you and your health needs are our priority.  As part of our continuing mission to provide you with exceptional heart care, we have created designated Provider Care Teams.  These Care Teams include your primary Cardiologist (physician) and Advanced Practice Providers (APPs -  Physician Assistants and Nurse Practitioners) who all work together to provide you with the care you need, when you need it.

## 2020-04-29 ENCOUNTER — Other Ambulatory Visit: Payer: Self-pay | Admitting: Adult Health

## 2020-04-29 DIAGNOSIS — C50411 Malignant neoplasm of upper-outer quadrant of right female breast: Secondary | ICD-10-CM

## 2020-04-29 DIAGNOSIS — Z170 Estrogen receptor positive status [ER+]: Secondary | ICD-10-CM

## 2020-04-30 ENCOUNTER — Other Ambulatory Visit: Payer: Self-pay

## 2020-04-30 DIAGNOSIS — Z170 Estrogen receptor positive status [ER+]: Secondary | ICD-10-CM

## 2020-04-30 DIAGNOSIS — C50411 Malignant neoplasm of upper-outer quadrant of right female breast: Secondary | ICD-10-CM

## 2020-04-30 MED ORDER — ANASTROZOLE 1 MG PO TABS: 1.0000 mg | ORAL_TABLET | Freq: Every day | ORAL | 3 refills | Status: DC

## 2020-05-11 DIAGNOSIS — F4310 Post-traumatic stress disorder, unspecified: Secondary | ICD-10-CM | POA: Diagnosis not present

## 2020-05-11 DIAGNOSIS — F312 Bipolar disorder, current episode manic severe with psychotic features: Secondary | ICD-10-CM | POA: Diagnosis not present

## 2020-05-11 DIAGNOSIS — F410 Panic disorder [episodic paroxysmal anxiety] without agoraphobia: Secondary | ICD-10-CM | POA: Diagnosis not present

## 2020-05-13 ENCOUNTER — Other Ambulatory Visit: Payer: Self-pay

## 2020-05-13 ENCOUNTER — Encounter: Payer: Self-pay | Admitting: Nurse Practitioner

## 2020-05-13 ENCOUNTER — Ambulatory Visit: Payer: BC Managed Care – PPO | Admitting: Nurse Practitioner

## 2020-05-13 VITALS — BP 113/56 | HR 56 | Temp 97.4°F | Resp 20 | Ht 64.0 in | Wt 226.0 lb

## 2020-05-13 DIAGNOSIS — E669 Obesity, unspecified: Secondary | ICD-10-CM | POA: Diagnosis not present

## 2020-05-13 DIAGNOSIS — I10 Essential (primary) hypertension: Secondary | ICD-10-CM

## 2020-05-13 DIAGNOSIS — K219 Gastro-esophageal reflux disease without esophagitis: Secondary | ICD-10-CM | POA: Diagnosis not present

## 2020-05-13 DIAGNOSIS — Z170 Estrogen receptor positive status [ER+]: Secondary | ICD-10-CM | POA: Diagnosis not present

## 2020-05-13 DIAGNOSIS — M129 Arthropathy, unspecified: Secondary | ICD-10-CM | POA: Diagnosis not present

## 2020-05-13 DIAGNOSIS — C50411 Malignant neoplasm of upper-outer quadrant of right female breast: Secondary | ICD-10-CM | POA: Diagnosis not present

## 2020-05-13 MED ORDER — ESOMEPRAZOLE MAGNESIUM 40 MG PO CPDR: 40.0000 mg | DELAYED_RELEASE_CAPSULE | Freq: Every day | ORAL | 1 refills | Status: DC

## 2020-05-13 MED ORDER — LORAZEPAM 0.5 MG PO TABS: 0.5000 mg | ORAL_TABLET | Freq: Every evening | ORAL | 0 refills | Status: DC | PRN

## 2020-05-13 NOTE — Patient Instructions (Signed)
Cancer Survivorship and Exercise For a cancer survivor, the right exercise program can provide many benefits. It can improve overall health and can help prevent or reduce symptoms such as fatigue or depression. Exercise has also been shown to help keep certain kinds of cancer from coming back and to improve cancer survival rates. Before you begin an exercise program, you need to make sure it is designed with your safety and needs in mind. Be sure to talk with your cancer care team about what activities and exercises are safe for you. How can I develop a safe and effective exercise plan? Meet with an exercise or rehabilitation specialist. This person can help you create an exercise plan that is right for you. Together, you should think about:  The best types of activity for you. Most people benefit from a mix of different kinds of activity, such as walking and strength training. Think about safety, health benefits, and what you prefer.  What types of activity you should avoid. For instance: ? You might need to avoid lifting more than a certain amount of weight. Ask your health care provider what weight limit is best. ? If you are at a high risk for infection, you might need to stay away from public places. ? If you recently had radiation, you might need to avoid swimming pool water because it can irritate your skin.  How often you should exercise and for how long. For most cancer survivors, 2 hours and 30 minutes of moderate exercise, such as brisk walking, per week is fine. Try to spread out the exercise time during the week.  Available resources. Look for local facilities with classes, activities, or programs that work for your age group or interests. Health clubs in your community may also have programs that are designed for cancer survivors.  What activities you can do at home or in your own neighborhood. What types of exercise should I do?  Talk with your health care provider about what types  of exercise are safe for you. Moderate exercise can include walking, swimming, or bike riding. Along with moderate aerobic exercise, you may benefit from strength and balance exercises. Strength and balance exercises include yoga, tai chi, or resistance exercises with light weights or bands. What are some tips for exercising safely?   Remember that the goal is to stay active as safely as possible. ? If you were not active before cancer, you may need to start out slowly and work up to the level of activity that your health care provider recommends. ? If you exercised regularly before cancer, you may need to cut back on how intense your program is until you fully recover. ? Make sure you stay hydrated. Drink 8-10 glasses of water a day. Drink water before, during, and after exercise. ? If you experience pain while exercising, stop and rest. Talk with your health care provider about the pain before you go back to exercising. ? Protect yourself from sun exposure when exercising outdoors. ? Balance activity with rest, but remember that naps can interfere with nighttime sleep. What are some other ways to be physically active? Besides an exercise program, you can find other ways to work physical activity into your day. This could include:  Gardening or mowing the lawn.  Housecleaning.  Dancing.  Playing with children.  Riding a bike. Contact a health care provider if:  You are not sure whether an activity is safe for you.  You have any new symptoms or any changes  in how you feel. Get help right away if:  You have a fever.  You have trouble breathing.  You have dizziness or a bad headache.  You have a new rash or skin infection.  You have new swelling anywhere on your body.  You have any other symptom that your health care provider has said is an emergency. Summary  For a cancer survivor, the right exercise program can help prevent or reduce symptoms such as fatigue or depression.  It may also help keep the cancer from coming back.  Talk with your cancer care team about what activities and exercises are safe for you.  Moderate exercise can include walking, swimming, or bike riding.  Find simple and enjoyable ways to work physical activity into your day. This could include gardening or dancing.  Contact a health care provider if you have any new symptoms or any changes in how you feel. This information is not intended to replace advice given to you by your health care provider. Make sure you discuss any questions you have with your health care provider. Document Revised: 03/15/2018 Document Reviewed: 02/19/2016 Elsevier Patient Education  2020 [employer].

## 2020-05-13 NOTE — Progress Notes (Signed)
Subjective:    Patient ID: Doris Smith, female    DOB: 05-Feb-1951, 69 y.o.   MRN: [id number]  Chief Complaint: Medical Management of Chronic Issues    HPI:  1. Essential hypertension BP Readings from Last 3 Encounters:  05/13/20 (!) 113/56  04/27/20 (!) 142/72  04/01/20 (!) 159/67  Takes medication as prescribed. Does not take blood pressure at home. States she is beginning to watch her diet and avoids salt. Denies chest pain, weakness, SOB, or headaches.    2. Gastroesophageal reflux disease without esophagitis Takes medication as prescribed. Avoids spicy foods. Denies any issues. Avoids foods fried or greasy foods.   3. Arthropathy No new or worsening symptoms. Chronic right knee pain, is seeing a specialist next week.    4. Obesity (BMI 30-39.9) Wt Readings from Last 3 Encounters:  05/13/20 226 lb (102.5 kg)  04/27/20 226 lb 9.6 oz (102.8 kg)  04/01/20 228 lb (103.4 kg)   BMI Readings from Last 3 Encounters:  05/13/20 38.79 kg/m  04/27/20 38.90 kg/m  04/01/20 39.14 kg/m   Does not exercise due to knee pain. Uses cane to ambulate.   5. GAD Says her anxiety is worse since she had cancer. GAD 7 : Generalized Anxiety Score 05/13/2020 10/24/2019 05/15/2019 12/10/2015  Nervous, Anxious, on Edge 3 3 3 3   Control/stop worrying 1 1 2 3   Worry too much - different things 1 1 2 3   Trouble relaxing 3 1 3 3   Restless 2 1 2 3   Easily annoyed or irritable 2 2 3 3   Afraid - awful might happen 0 0 0 0  Total GAD 7 Score 12 9 15 18   Anxiety Difficulty Somewhat difficult Not difficult at all Somewhat difficult Not difficult at all        Outpatient Encounter Medications as of 05/13/2020  Medication Sig  . anastrozole (ARIMIDEX) 1 MG tablet Take 1 tablet (1 mg total) by mouth daily.  Marland Kitchen apixaban (ELIQUIS) 5 MG TABS tablet Take 1 tablet (5 mg total) by mouth 2 (two) times daily.  . benzonatate (TESSALON PERLES) 100 MG capsule Take 1 capsule (100 mg total) by mouth 3  (three) times daily as needed for cough.  . clotrimazole-betamethasone (LOTRISONE) cream APPLY  CREAM TOPICALLY TWICE DAILY  . esomeprazole (NEXIUM) 40 MG capsule Take 1 capsule by mouth once daily  . gabapentin (NEURONTIN) 300 MG capsule Take 1 capsule (300 mg total) by mouth 3 (three) times daily.  Marland Kitchen ibandronate (BONIVA) 150 MG tablet Take 1 tablet (150 mg total) by mouth every 30 (thirty) days. Take in the morning with a full glass of water, on an empty stomach, and do not take anything else by mouth or lie down for the next 30 min.  . lamoTRIgine (LAMICTAL) 100 MG tablet Take 1 tablet (100 mg total) by mouth in the morning, at noon, and at bedtime.  Marland Kitchen loratadine (CLARITIN) 10 MG tablet Take 10 mg by mouth daily.  Marland Kitchen LORazepam (ATIVAN) 0.5 MG tablet Take 1 tablet (0.5 mg total) by mouth at bedtime as needed (Nausea or vomiting).  . midodrine (PROAMATINE) 2.5 MG tablet Take 1 tablet (2.5 mg total) by mouth 3 (three) times daily with meals.  . promethazine-dextromethorphan (PROMETHAZINE-DM) 6.25-15 MG/5ML syrup TAKE 5 MILLILITERS BY MOUTH 4 TIMES DAILY AS NEEDED FOR COUGH  . Vilazodone HCl (VIIBRYD) 40 MG TABS Take 1 tablet (40 mg total) by mouth daily.  . metoprolol tartrate (LOPRESSOR) 25 MG tablet Take 1 tablet (25 mg  total) by mouth 2 (two) times daily.   Facility-Administered Encounter Medications as of 05/13/2020  Medication  . heparin lock flush 100 unit/mL  . sodium chloride flush (NS) 0.9 % injection 10 mL    Past Surgical History:  Procedure Laterality Date  . ABDOMINAL HYSTERECTOMY    . COLONOSCOPY    . DILATION AND CURETTAGE OF UTERUS  1981   abnormal pap  . KNEE ARTHROSCOPY Left 2007   x 2  . LUMBAR LAMINECTOMY/DECOMPRESSION MICRODISCECTOMY  05/31/2011   Procedure: LUMBAR LAMINECTOMY/DECOMPRESSION MICRODISCECTOMY;  Surgeon: Johnn Hai;  Location: [facility];  Service: Orthopedics;  Laterality: Left;  Decompression Lumbar four to five and  Lumbar five to Sacral one on Left   (X-Ray)  . MASTECTOMY W/ SENTINEL NODE BIOPSY Right 09/16/2018   Procedure: RIGHT MASTECTOMY WITH RIGHT AXILLARY SENTINEL LYMPH NODE BIOPSY;  Surgeon: Alphonsa Overall, MD;  Location: [place];  Service: General;  Laterality: Right;  . PARTIAL HYSTERECTOMY  1982  . PORTACATH PLACEMENT Left 10/31/2018   Procedure: INSERTION PORT-A-CATH WITH ULTRASOUND;  Surgeon: Alphonsa Overall, MD;  Location: [place];  Service: General;  Laterality: Left;  . TONSILLECTOMY     as child  . TOTAL KNEE ARTHROPLASTY Left 12/18/2005    Family History  Problem Relation Age of Onset  . Anesthesia problems Mother   . Heart disease Mother        CHF, atrial fib  . Heart failure Mother   . Anesthesia problems Sister   . Colon polyps Father   . Heart disease Father        "Fluid around the heart"  . Hypertension Sister   . Breast cancer Maternal Aunt   . Colon cancer Maternal Aunt   . Prostate cancer Neg Hx   . Ovarian cancer Neg Hx     New complaints: No new complaints today.   Social history: Lives at home with husband. Home renovations, children's.   Controlled substance contract: n/a    Review of Systems  Constitutional: Negative.   HENT: Negative.   Eyes: Negative.   Respiratory: Negative.   Cardiovascular: Negative.   Gastrointestinal: Negative.   Endocrine: Negative.   Genitourinary: Negative.   Musculoskeletal: Negative.   Skin: Negative.   Allergic/Immunologic: Negative.   Neurological: Negative.   Hematological: Negative.   Psychiatric/Behavioral: Negative.   All other systems reviewed and are negative.      Objective:   Physical Exam Vitals and nursing note reviewed.  Constitutional:      Appearance: Normal appearance.  HENT:     Head: Normocephalic and atraumatic.     Right Ear: Tympanic membrane, ear canal and external ear normal.     Left Ear: Tympanic membrane, ear canal and external ear normal.     Nose: Nose normal.     Mouth/Throat:     Mouth: Mucous  membranes are moist.     Pharynx: Oropharynx is clear.  Eyes:     Extraocular Movements: Extraocular movements intact.     Conjunctiva/sclera: Conjunctivae normal.     Pupils: Pupils are equal, round, and reactive to light.  Cardiovascular:     Rate and Rhythm: Normal rate and regular rhythm.     Pulses: Normal pulses.     Heart sounds: Normal heart sounds.  Pulmonary:     Effort: Pulmonary effort is normal.     Breath sounds: Normal breath sounds.  Chest:     Breasts:        Right: Tenderness present.  Left: Normal.     Comments: Previous right breast mastectomy  Abdominal:     General: Abdomen is flat. Bowel sounds are normal.  Musculoskeletal:        General: Normal range of motion.     Cervical back: Normal range of motion.  Skin:    General: Skin is warm and dry.     Capillary Refill: Capillary refill takes less than 2 seconds.  Neurological:     General: No focal deficit present.     Mental Status: She is alert and oriented to person, place, and time. Mental status is at baseline.  Psychiatric:        Mood and Affect: Mood normal.        Behavior: Behavior normal.        Thought Content: Thought content normal.        Judgment: Judgment normal.     BP (!) 113/56   Pulse (!) 56   Temp (!) 97.4 F (36.3 C) (Temporal)   Resp 20   Ht 5\' 4"  (1.626 m)   Wt 226 lb (102.5 kg)   SpO2 99%   BMI 38.79 kg/m      Assessment & Plan:  Doris Smith comes in today with chief complaint of Medical Management of Chronic Issues   Diagnosis and orders addressed:  1. Essential hypertension Take medication as prescribed and check blood pressure regularly. Avoid high salt foods and eat a heart healthy diet.   2. Gastroesophageal reflux disease without esophagitis Take medication as prescribed. Avoid trigger foods such as spicy or greasy foods. Avoid large meals and do not lie down after eating.   3. Arthropathy Take medication as needed. Stay active.   4.  Obesity (BMI 30-39.9) Exercise regularly. Walking and swimming are great cardiovascular exercises that help lower blood pressure and help you maintain a healthy weight.   Meds ordered this encounter  Medications  . esomeprazole (NEXIUM) 40 MG capsule    Sig: Take 1 capsule (40 mg total) by mouth daily.    Dispense:  90 capsule    Refill:  1    Order Specific Question:   Supervising Provider    Answer:   Caryl Pina A [id number]  . LORazepam (ATIVAN) 0.5 MG tablet    Sig: Take 1 tablet (0.5 mg total) by mouth at bedtime as needed (Nausea or vomiting).    Dispense:  30 tablet    Refill:  0    Order Specific Question:   Supervising Provider    Answer:   Caryl Pina A [id number]    Labs pending Health Maintenance reviewed Diet and exercise encouraged  Follow up plan: Follow up in 3 months.    [name] Done, FNP

## 2020-05-17 ENCOUNTER — Inpatient Hospital Stay: Payer: BC Managed Care – PPO

## 2020-05-17 ENCOUNTER — Inpatient Hospital Stay: Payer: BC Managed Care – PPO | Admitting: Oncology

## 2020-05-17 ENCOUNTER — Other Ambulatory Visit: Payer: Self-pay | Admitting: Oncology

## 2020-05-18 DIAGNOSIS — M1711 Unilateral primary osteoarthritis, right knee: Secondary | ICD-10-CM | POA: Diagnosis not present

## 2020-05-24 ENCOUNTER — Other Ambulatory Visit: Payer: Self-pay | Admitting: Oncology

## 2020-05-26 ENCOUNTER — Telehealth: Payer: Self-pay

## 2020-05-26 NOTE — Telephone Encounter (Signed)
Called pt to inform her that her appt has been moved to 06/18/20 at 1000 lab 1030 MD visit. Pt verbalizes agreement

## 2020-05-26 NOTE — Telephone Encounter (Signed)
-----   Message from [facility] sent at 05/26/2020 11:59 AM EST ----- Regarding: Doris Smith pt. Hey Mickel Baas - can you help me try to reach her - per GM he wont be in on 12/3 so I had to reschedule her to 12/10 . Have not been able to reach her

## 2020-05-31 ENCOUNTER — Other Ambulatory Visit: Payer: Self-pay | Admitting: Adult Health

## 2020-05-31 DIAGNOSIS — C50411 Malignant neoplasm of upper-outer quadrant of right female breast: Secondary | ICD-10-CM

## 2020-05-31 DIAGNOSIS — R21 Rash and other nonspecific skin eruption: Secondary | ICD-10-CM | POA: Diagnosis not present

## 2020-05-31 DIAGNOSIS — Z170 Estrogen receptor positive status [ER+]: Secondary | ICD-10-CM

## 2020-05-31 DIAGNOSIS — I4891 Unspecified atrial fibrillation: Secondary | ICD-10-CM | POA: Diagnosis not present

## 2020-06-02 ENCOUNTER — Other Ambulatory Visit: Payer: Self-pay | Admitting: Nurse Practitioner

## 2020-06-02 DIAGNOSIS — K219 Gastro-esophageal reflux disease without esophagitis: Secondary | ICD-10-CM

## 2020-06-04 ENCOUNTER — Other Ambulatory Visit: Payer: Self-pay | Admitting: Adult Health

## 2020-06-04 DIAGNOSIS — C50411 Malignant neoplasm of upper-outer quadrant of right female breast: Secondary | ICD-10-CM

## 2020-06-04 DIAGNOSIS — Z170 Estrogen receptor positive status [ER+]: Secondary | ICD-10-CM

## 2020-06-07 ENCOUNTER — Telehealth: Payer: Self-pay | Admitting: Nurse Practitioner

## 2020-06-07 NOTE — Telephone Encounter (Signed)
Pt stated that about a month ago her right side (arm down to foot) broke out in red spots that itch, two spots on the inside of her arm were bigger and swollen. She was seen in [facility] at urgent care last Monday (11/22) they prescribed her Cloeetasol 0.05% 2 times a day for 14 days. Previously had cancer on right side and no longer has a right breast. Wants to know if there is anything she can do so relive the itching.

## 2020-06-07 NOTE — Telephone Encounter (Signed)
Attempted to contact patient - NVM 

## 2020-06-08 NOTE — Telephone Encounter (Signed)
Pt called demanding to speak with MMM nurse. Explained to pt that nurse is helping see pt and is currently unavailable.  Pt is very upset that she cant speak to nurse.  Offered pt to speak with manager. Pt said she wants to speak with a Freight forwarder.   Managers are in a meeting.  Explained that to pt and told pt someone would call her back ASAP.

## 2020-06-08 NOTE — Telephone Encounter (Signed)
Doris Smith, see below.  Do you recommend anything else?

## 2020-06-08 NOTE — Telephone Encounter (Signed)
Spoke with patient and advised her that one of the nurses had tried to call her yesterday and she was not available and did not have voicemail.  Told her I had sent her message to Doris Smith to see if there was another medication she recommended.  After speaking with patient, found out that she had started with lesions on her foot that went to her wrist and now were on her leg.  I advised patient that she should be seen to be evaluated.  Scheduled an appointment at 11:30 am on 06/11/2011 with Doris Smith.  Patient is appreciative and agrees.

## 2020-06-10 ENCOUNTER — Ambulatory Visit: Payer: Medicare Other | Admitting: Nurse Practitioner

## 2020-06-10 ENCOUNTER — Other Ambulatory Visit: Payer: Self-pay

## 2020-06-10 ENCOUNTER — Telehealth: Payer: Self-pay | Admitting: Cardiology

## 2020-06-10 ENCOUNTER — Encounter: Payer: Self-pay | Admitting: Nurse Practitioner

## 2020-06-10 ENCOUNTER — Ambulatory Visit: Payer: BC Managed Care – PPO | Admitting: Nurse Practitioner

## 2020-06-10 VITALS — BP 138/69 | HR 60 | Temp 98.1°F | Ht 64.0 in | Wt 228.0 lb

## 2020-06-10 DIAGNOSIS — F418 Other specified anxiety disorders: Secondary | ICD-10-CM | POA: Diagnosis not present

## 2020-06-10 DIAGNOSIS — R21 Rash and other nonspecific skin eruption: Secondary | ICD-10-CM | POA: Insufficient documentation

## 2020-06-10 DIAGNOSIS — F4321 Adjustment disorder with depressed mood: Secondary | ICD-10-CM | POA: Insufficient documentation

## 2020-06-10 MED ORDER — HYDROXYZINE HCL 10 MG PO TABS: 10.0000 mg | ORAL_TABLET | Freq: Three times a day (TID) | ORAL | 0 refills | Status: DC | PRN

## 2020-06-10 MED ORDER — QUETIAPINE FUMARATE 25 MG PO TABS: 25.0000 mg | ORAL_TABLET | Freq: Every day | ORAL | 0 refills | Status: DC

## 2020-06-10 NOTE — Assessment & Plan Note (Signed)
Recent loss off (Death) Mother, patient is grieving. provided emotional support, advised and provided resources for counseling.

## 2020-06-10 NOTE — Telephone Encounter (Signed)
If the rash/itching is localized and in only one spot, this is not related to any medication.   Rash or itching in big part of the body like torso, let us know and we will reassess any new medication.  If no bleeding present please continue taking medication as prescribed.

## 2020-06-10 NOTE — Progress Notes (Signed)
Acute Office Visit  Subjective:    Patient ID: Doris Smith, female    DOB: 08-28-1950, 69 y.o.   MRN: [id number]  Chief Complaint  Patient presents with  . Rash  . Headache    Rash This is a recurrent problem. The current episode started more than 1 month ago. The problem is unchanged. The affected locations include the right upper leg and right lower leg. The rash is characterized by itchiness and redness. She was exposed to nothing. Pertinent negatives include no congestion, cough, shortness of breath, sore throat or vomiting. Past treatments include anti-itch cream. The treatment provided mild relief.  Headache  This is a recurrent problem. The current episode started yesterday. The problem occurs constantly. The problem has been unchanged. The pain is located in the bilateral and frontal region. The pain does not radiate. The pain quality is similar to prior headaches. The quality of the pain is described as aching. The pain is moderate. Pertinent negatives include no coughing, sore throat or vomiting. Exacerbated by: sinus. The treatment provided no relief. Her past medical history is significant for cancer.   Anxiety: Patient complains of anxiety disorder.  She has the following symptoms: difficulty concentrating, fatigue, feelings of losing control, irritable. Onset of symptoms was approximately a few years ago, gradually worsening since that time. She denies current suicidal and homicidal ideation. Family history significant for no psychiatric illness.Possible organic causes contributing are: Illness (Cancer), grief. Risk factors: previous episode of depression and personality disorder and negative life event. Previous treatment includes Ativan and group therapy and medication.  She complains of the following side effects from the treatment: none.    Past Medical History:  Diagnosis Date  . A-fib ([place]) 11/2012   PAF  . Anxiety    takes Ativan  . Asthma 04/07/2011   dx  .  Bipolar disorder ([place])   . Cancer [facility])    Right breast  . Depression   . Early cataracts, bilateral   . Fatty liver   . Fibromyalgia   . GERD (gastroesophageal reflux disease)   . Irritable bowel syndrome   . Mental disorder    dx bipolar  . PONV (postoperative nausea and vomiting)   . Sleep apnea 04/2011    does not wear CPAP    Past Surgical History:  Procedure Laterality Date  . ABDOMINAL HYSTERECTOMY    . COLONOSCOPY    . DILATION AND CURETTAGE OF UTERUS  1981   abnormal pap  . KNEE ARTHROSCOPY Left 2007   x 2  . LUMBAR LAMINECTOMY/DECOMPRESSION MICRODISCECTOMY  05/31/2011   Procedure: LUMBAR LAMINECTOMY/DECOMPRESSION MICRODISCECTOMY;  Surgeon: Johnn Hai;  Location: [facility];  Service: Orthopedics;  Laterality: Left;  Decompression Lumbar four to five and  Lumbar five to Sacral one on Left  (X-Ray)  . MASTECTOMY W/ SENTINEL NODE BIOPSY Right 09/16/2018   Procedure: RIGHT MASTECTOMY WITH RIGHT AXILLARY SENTINEL LYMPH NODE BIOPSY;  Surgeon: Alphonsa Overall, MD;  Location: [place];  Service: General;  Laterality: Right;  . PARTIAL HYSTERECTOMY  1982  . PORTACATH PLACEMENT Left 10/31/2018   Procedure: INSERTION PORT-A-CATH WITH ULTRASOUND;  Surgeon: Alphonsa Overall, MD;  Location: [place];  Service: General;  Laterality: Left;  . TONSILLECTOMY     as child  . TOTAL KNEE ARTHROPLASTY Left 12/18/2005    Family History  Problem Relation Age of Onset  . Anesthesia problems Mother   . Heart disease Mother        CHF, atrial  fib  . Heart failure Mother   . Anesthesia problems Sister   . Colon polyps Father   . Heart disease Father        "Fluid around the heart"  . Hypertension Sister   . Breast cancer Maternal Aunt   . Colon cancer Maternal Aunt   . Prostate cancer Neg Hx   . Ovarian cancer Neg Hx     Social History   Socioeconomic History  . Marital status: Married    Spouse name: [name]  . Number of children: 2  . Years of education: 13  . Highest  education level: Not on file  Occupational History  . Occupation: Disabled  Tobacco Use  . Smoking status: Never Smoker  . Smokeless tobacco: Never Used  Vaping Use  . Vaping Use: Never used  Substance and Sexual Activity  . Alcohol use: No  . Drug use: No  . Sexual activity: Yes    Birth control/protection: Post-menopausal, Surgical  Other Topics Concern  . Not on file  Social History Narrative   Tea daily.  Rarely has caffeine    Social Determinants of Health   Financial Resource Strain:   . Difficulty of Paying Living Expenses: Not on file  Food Insecurity:   . Worried About Charity fundraiser in the Last Year: Not on file  . Ran Out of Food in the Last Year: Not on file  Transportation Needs:   . Lack of Transportation (Medical): Not on file  . Lack of Transportation (Non-Medical): Not on file  Physical Activity:   . Days of Exercise per Week: Not on file  . Minutes of Exercise per Session: Not on file  Stress:   . Feeling of Stress : Not on file  Social Connections:   . Frequency of Communication with Friends and Family: Not on file  . Frequency of Social Gatherings with Friends and Family: Not on file  . Attends Religious Services: Not on file  . Active Member of Clubs or Organizations: Not on file  . Attends Archivist Meetings: Not on file  . Marital Status: Not on file  Intimate Partner Violence:   . Fear of Current or Ex-Partner: Not on file  . Emotionally Abused: Not on file  . Physically Abused: Not on file  . Sexually Abused: Not on file    Outpatient Medications Prior to Visit  Medication Sig Dispense Refill  . anastrozole (ARIMIDEX) 1 MG tablet Take 1 tablet by mouth once daily 30 tablet 0  . apixaban (ELIQUIS) 5 MG TABS tablet Take 1 tablet (5 mg total) by mouth 2 (two) times daily. 180 tablet 3  . benzonatate (TESSALON PERLES) 100 MG capsule Take 1 capsule (100 mg total) by mouth 3 (three) times daily as needed for cough. 20 capsule 0  .  clotrimazole-betamethasone (LOTRISONE) cream APPLY  CREAM TOPICALLY TWICE DAILY 45 g 0  . esomeprazole (NEXIUM) 40 MG capsule Take 1 capsule by mouth once daily 90 capsule 0  . gabapentin (NEURONTIN) 300 MG capsule Take 1 capsule (300 mg total) by mouth 3 (three) times daily. 180 capsule 1  . ibandronate (BONIVA) 150 MG tablet Take 1 tablet (150 mg total) by mouth every 30 (thirty) days. Take in the morning with a full glass of water, on an empty stomach, and do not take anything else by mouth or lie down for the next 30 min. 3 tablet 4  . lamoTRIgine (LAMICTAL) 100 MG tablet Take 1 tablet (100 mg  total) by mouth in the morning, at noon, and at bedtime. 180 tablet 1  . loratadine (CLARITIN) 10 MG tablet Take 10 mg by mouth daily.    Marland Kitchen LORazepam (ATIVAN) 0.5 MG tablet Take 1 tablet (0.5 mg total) by mouth at bedtime as needed (Nausea or vomiting). 30 tablet 0  . metoprolol tartrate (LOPRESSOR) 25 MG tablet Take 1 tablet (25 mg total) by mouth 2 (two) times daily. 180 tablet 3  . midodrine (PROAMATINE) 2.5 MG tablet Take 1 tablet (2.5 mg total) by mouth 3 (three) times daily with meals. 270 tablet 3  . promethazine-dextromethorphan (PROMETHAZINE-DM) 6.25-15 MG/5ML syrup TAKE 5 MILLILITERS BY MOUTH 4 TIMES DAILY AS NEEDED FOR COUGH    . Vilazodone HCl (VIIBRYD) 40 MG TABS Take 1 tablet (40 mg total) by mouth daily. 90 tablet 1   Facility-Administered Medications Prior to Visit  Medication Dose Route Frequency Provider Last Rate Last Admin  . heparin lock flush 100 unit/mL  500 Units Intravenous Once Magrinat, Virgie Dad, MD      . sodium chloride flush (NS) 0.9 % injection 10 mL  10 mL Intravenous PRN Magrinat, Virgie Dad, MD        Allergies  Allergen Reactions  . Iohexol     "over 20 years ago" had reaction "hurting in arm and heart"-Done in [place], [place].Marland Kitchenphysician stopped CT at that time.  . Codeine Other (See Comments)    Makes feel like she is wild   . Palonosetron Other (See Comments)     headache  . Prednisone Other (See Comments)    Makes me very irritable  . Sulfonamide Derivatives Other (See Comments)    Upset stomach  . Celecoxib Nausea And Vomiting  . Sulfa Antibiotics Nausea And Vomiting  . Vitamin B12 Rash    Review of Systems  HENT: Negative for congestion and sore throat.   Respiratory: Negative for cough and shortness of breath.   Gastrointestinal: Negative for vomiting.  Skin: Positive for rash.  Neurological: Positive for headaches.  Psychiatric/Behavioral: Negative for self-injury, sleep disturbance and suicidal ideas. The patient is nervous/anxious.        Objective:    Physical Exam Vitals reviewed.  HENT:     Head: Normocephalic.  Eyes:     Conjunctiva/sclera: Conjunctivae normal.  Cardiovascular:     Rate and Rhythm: Normal rate.     Pulses: Normal pulses.     Heart sounds: Normal heart sounds.  Pulmonary:     Effort: Pulmonary effort is normal.     Breath sounds: Normal breath sounds.  Abdominal:     General: Bowel sounds are normal.     Palpations: Abdomen is soft.  Musculoskeletal:        General: Normal range of motion.  Skin:    General: Skin is warm.  Neurological:     Mental Status: She is alert and oriented to person, place, and time.     Comments: Headache  Psychiatric:     Comments: Anxiety, grief     BP 138/69   Pulse 60   Temp 98.1 F (36.7 C) (Temporal)   Ht 5\' 4"  (1.626 m)   Wt 228 lb (103.4 kg)   BMI 39.14 kg/m  Wt Readings from Last 3 Encounters:  06/10/20 228 lb (103.4 kg)  05/13/20 226 lb (102.5 kg)  04/27/20 226 lb 9.6 oz (102.8 kg)    Health Maintenance Due  Topic Date Due  . TETANUS/TDAP  Never done  . DEXA SCAN  10/14/2018  There are no preventive care reminders to display for this patient.   Lab Results  Component Value Date   TSH 3.260 01/26/2020   Lab Results  Component Value Date   WBC 5.5 02/03/2020   HGB 11.9 02/03/2020   HCT 34.9 02/03/2020   MCV 84 02/03/2020   PLT 262  02/03/2020   Lab Results  Component Value Date   NA 140 02/03/2020   K 4.4 02/03/2020   CO2 27 02/03/2020   GLUCOSE 82 02/03/2020   BUN 9 02/03/2020   CREATININE 0.86 02/03/2020   BILITOT 0.4 02/03/2020   ALKPHOS 112 02/03/2020   AST 15 02/03/2020   ALT 11 02/03/2020   PROT 6.4 02/03/2020   ALBUMIN 4.1 02/03/2020   CALCIUM 9.0 02/03/2020   ANIONGAP 7 01/20/2020   Lab Results  Component Value Date   CHOL 203 (H) 02/03/2020   Lab Results  Component Value Date   HDL 76 02/03/2020   Lab Results  Component Value Date   LDLCALC 106 (H) 02/03/2020   Lab Results  Component Value Date   TRIG 121 02/03/2020   Lab Results  Component Value Date   CHOLHDL 2.7 02/03/2020   No results found for: HGBA1C     Assessment & Plan:   Problem List Items Addressed This Visit      Musculoskeletal and Integument   Rash - Primary    Recurrent unresolved rash. Patient went to the urgent care and was treated on Monday with no relief. Started pt on hydrOXYzine as needed   Follow up with unresolved or worsening symptoms  Rx sent to pharmacy        Other   Unresolved grief    Recent loss off (Death) Mother, patient is grieving. provided emotional support, advised and provided resources for counseling.       Anxiety associated with depression    Not well controlled. Started patient on Seroquel 25 mg, at bed time, follow up in 2 weeks  Rx sent to pharmacy. Education with printed hand out given      Relevant Medications   hydrOXYzine (ATARAX/VISTARIL) 10 MG tablet       Meds ordered this encounter  Medications  . QUEtiapine (SEROQUEL) 25 MG tablet    Sig: Take 1 tablet (25 mg total) by mouth at bedtime.    Dispense:  30 tablet    Refill:  0    Order Specific Question:   Supervising Provider    Answer:   Caryl Pina A [id number]  . hydrOXYzine (ATARAX/VISTARIL) 10 MG tablet    Sig: Take 1 tablet (10 mg total) by mouth 3 (three) times daily as needed for itching.     Dispense:  30 tablet    Refill:  0    Order Specific Question:   Supervising Provider    Answer:   Caryl Pina A [[id number]]     Ivy Lynn, NP

## 2020-06-10 NOTE — Assessment & Plan Note (Signed)
Recurrent unresolved rash. Patient went to the urgent care and was treated on Monday with no relief. Started pt on hydrOXYzine as needed   Follow up with unresolved or worsening symptoms  Rx sent to pharmacy

## 2020-06-10 NOTE — Patient Instructions (Signed)
Rash, Adult  A rash is a change in the color of your skin. A rash can also change the way your skin feels. There are many different conditions and factors that can cause a rash. Follow these instructions at home: The goal of treatment is to stop the itching and keep the rash from spreading. Watch for any changes in your symptoms. Let your doctor know about them. Follow these instructions to help with your condition: Medicine Take or apply over-the-counter and prescription medicines only as told by your doctor. These may include medicines:  To treat red or swollen skin (corticosteroid creams).  To treat itching.  To treat an allergy (oral antihistamines).  To treat very bad symptoms (oral corticosteroids).  Skin care  Put cool cloths (compresses) on the affected areas.  Do not scratch or rub your skin.  Avoid covering the rash. Make sure that the rash is exposed to air as much as possible. Managing itching and discomfort  Avoid hot showers or baths. These can make itching worse. A cold shower may help.  Try taking a bath with: ? Epsom salts. You can get these at your local pharmacy or grocery store. Follow the instructions on the package. ? Baking soda. Pour a small amount into the bath as told by your doctor. ? Colloidal oatmeal. You can get this at your local pharmacy or grocery store. Follow the instructions on the package.  Try putting baking soda paste onto your skin. Stir water into baking soda until it gets like a paste.  Try putting on a lotion that relieves itchiness (calamine lotion).  Keep cool and out of the sun. Sweating and being hot can make itching worse. General instructions   Rest as needed.  Drink enough fluid to keep your pee (urine) pale yellow.  Wear loose-fitting clothing.  Avoid scented soaps, detergents, and perfumes. Use gentle soaps, detergents, perfumes, and other cosmetic products.  Avoid anything that causes your rash. Keep a journal to  help track what causes your rash. Write down: ? What you eat. ? What cosmetic products you use. ? What you drink. ? What you wear. This includes jewelry.  Keep all follow-up visits as told by your doctor. This is important. Contact a doctor if:  You sweat at night.  You lose weight.  You pee (urinate) more than normal.  You pee less than normal, or you notice that your pee is a darker color than normal.  You feel weak.  You throw up (vomit).  Your skin or the whites of your eyes look yellow (jaundice).  Your skin: ? Tingles. ? Is numb.  Your rash: ? Does not go away after a few days. ? Gets worse.  You are: ? More thirsty than normal. ? More tired than normal.  You have: ? New symptoms. ? Pain in your belly (abdomen). ? A fever. ? Watery poop (diarrhea). Get help right away if:  You have a fever and your symptoms suddenly get worse.  You start to feel mixed up (confused).  You have a very bad headache or a stiff neck.  You have very bad joint pains or stiffness.  You have jerky movements that you cannot control (seizure).  Your rash covers all or most of your body. The rash may or may not be painful.  You have blisters that: ? Are on top of the rash. ? Grow larger. ? Grow together. ? Are painful. ? Are inside your nose or mouth.  You have a rash   that: ? Looks like purple pinprick-sized spots all over your body. ? Has a "bull's eye" or looks like a target. ? Is red and painful, causes your skin to peel, and is not from being in the sun too long. Summary  A rash is a change in the color of your skin. A rash can also change the way your skin feels.  The goal of treatment is to stop the itching and keep the rash from spreading.  Take or apply over-the-counter and prescription medicines only as told by your doctor.  Contact a doctor if you have new symptoms or symptoms that get worse.  Keep all follow-up visits as told by your doctor. This is  important. This information is not intended to replace advice given to you by your health care provider. Make sure you discuss any questions you have with your health care provider. Document Revised: 10/18/2018 Document Reviewed: 01/28/2018 Elsevier Patient Education  [place] Loss, Adult People experience loss in many different ways throughout their lives. Events such as moving, changing jobs, and losing friends can create a sense of loss. The loss may be as serious as a major health change, divorce, death of a pet, or death of a loved one. All of these types of loss are likely to create a physical and emotional reaction known as grief. Grief is the result of a major change or an absence of something or someone that you count on. Grief is a normal reaction to loss. A variety of factors can affect your grieving experience, including:  The nature of your loss.  Your relationship to what or whom you lost.  Your understanding of grief and how to manage it.  Your support system. How to manage lifestyle changes Keep to your normal routine as much as possible.  If you have trouble focusing or doing normal activities, it is acceptable to take some time away from your normal routine.  Spend time with friends and loved ones.  Eat a healthy diet, get plenty of sleep, and rest when you feel tired. How to recognize changes  The way that you deal with your grief will affect your ability to function as you normally do. When grieving, you may experience these changes:  Numbness, shock, sadness, anxiety, anger, denial, and guilt.  Thoughts about death.  Unexpected crying.  A physical sensation of emptiness in your stomach.  Problems sleeping and eating.  Tiredness (fatigue).  Loss of interest in normal activities.  Dreaming about or imagining seeing the person who died.  A need to remember what or whom you lost.  Difficulty thinking about anything other than your loss  for a period of time.  Relief. If you have been expecting the loss for a while, you may feel a sense of relief when it happens. Follow these instructions at home:  Activity Express your feelings in healthy ways, such as:  Talking with others about your loss. It may be helpful to find others who have had a similar loss, such as a support group.  Writing down your feelings in a journal.  Doing physical activities to release stress and emotional energy.  Doing creative activities like painting, sculpting, or playing or listening to music.  Practicing resilience. This is the ability to recover and adjust after facing challenges. Reading some resources that encourage resilience may help you to learn ways to practice those behaviors. General instructions  Be patient with yourself and others. Allow the grieving process to happen, and  remember that grieving takes time. ? It is likely that you may never feel completely done with some grief. You may find a way to move on while still cherishing memories and feelings about your loss. ? Accepting your loss is a process. It can take months or longer to adjust.  Keep all follow-up visits as told by your health care provider. This is important. Where to find support To get support for managing loss:  Ask your health care provider for help and recommendations, such as grief counseling or therapy.  Think about joining a support group for people who are managing a loss. Where to find more information You can find more information about managing loss from:  American Society of Clinical Oncology: www.cancer.net  American Psychological Association: TVStere[employer].ch Contact a health care provider if:  Your grief is extreme and keeps getting worse.  You have ongoing grief that does not improve.  Your body shows symptoms of grief, such as illness.  You feel depressed, anxious, or lonely. Get help right away if:  You have thoughts about hurting  yourself or others. If you ever feel like you may hurt yourself or others, or have thoughts about taking your own life, get help right away. You can go to your nearest emergency department or call:  Your local emergency services (911 in the [place]  A suicide crisis helpline, such as the [place] at (254)510-2930. This is open 24 hours a day. Summary  Grief is the result of a major change or an absence of someone or something that you count on. Grief is a normal reaction to loss.  The depth of grief and the period of recovery depend on the type of loss and your ability to adjust to the change and process your feelings.  Processing grief requires patience and a willingness to accept your feelings and talk about your loss with people who are supportive.  It is important to find resources that work for you and to realize that people experience grief differently. There is not one grieving process that works for everyone in the same way.  Be aware that when grief becomes extreme, it can lead to more severe issues like isolation, depression, anxiety, or suicidal thoughts. Talk with your health care provider if you have any of these issues. This information is not intended to replace advice given to you by your health care provider. Make sure you discuss any questions you have with your health care provider. Document Revised: 08/30/2018 Document Reviewed: 11/09/2016 Elsevier Patient Education  [place]. Generalized Anxiety Disorder, Adult Generalized anxiety disorder (GAD) is a mental health disorder. People with this condition constantly worry about everyday events. Unlike normal anxiety, worry related to GAD is not triggered by a specific event. These worries also do not fade or get better with time. GAD interferes with life functions, including relationships, work, and school. GAD can vary from mild to severe. People with severe GAD can have intense waves of  anxiety with physical symptoms (panic attacks). What are the causes? The exact cause of GAD is not known. What increases the risk? This condition is more likely to develop in:  Women.  People who have a family history of anxiety disorders.  People who are very shy.  People who experience very stressful life events, such as the death of a loved one.  People who have a very stressful family environment. What are the signs or symptoms? People with GAD often worry excessively about many  things in their lives, such as their health and family. They may also be overly concerned about:  Doing well at work.  Being on time.  Natural disasters.  Friendships. Physical symptoms of GAD include:  Fatigue.  Muscle tension or having muscle twitches.  Trembling or feeling shaky.  Being easily startled.  Feeling like your heart is pounding or racing.  Feeling out of breath or like you cannot take a deep breath.  Having trouble falling asleep or staying asleep.  Sweating.  Nausea, diarrhea, or irritable bowel syndrome (IBS).  Headaches.  Trouble concentrating or remembering facts.  Restlessness.  Irritability. How is this diagnosed? Your health care provider can diagnose GAD based on your symptoms and medical history. You will also have a physical exam. The health care provider will ask specific questions about your symptoms, including how severe they are, when they started, and if they come and go. Your health care provider may ask you about your use of alcohol or drugs, including prescription medicines. Your health care provider may refer you to a mental health specialist for further evaluation. Your health care provider will do a thorough examination and may perform additional tests to rule out other possible causes of your symptoms. To be diagnosed with GAD, a person must have anxiety that:  Is out of his or her control.  Affects several different aspects of his or her  life, such as work and relationships.  Causes distress that makes him or her unable to take part in normal activities.  Includes at least three physical symptoms of GAD, such as restlessness, fatigue, trouble concentrating, irritability, muscle tension, or sleep problems. Before your health care provider can confirm a diagnosis of GAD, these symptoms must be present more days than they are not, and they must last for six months or longer. How is this treated? The following therapies are usually used to treat GAD:  Medicine. Antidepressant medicine is usually prescribed for long-term daily control. Antianxiety medicines may be added in severe cases, especially when panic attacks occur.  Talk therapy (psychotherapy). Certain types of talk therapy can be helpful in treating GAD by providing support, education, and guidance. Options include: ? Cognitive behavioral therapy (CBT). People learn coping skills and techniques to ease their anxiety. They learn to identify unrealistic or negative thoughts and behaviors and to replace them with positive ones. ? Acceptance and commitment therapy (ACT). This treatment teaches people how to be mindful as a way to cope with unwanted thoughts and feelings. ? Biofeedback. This process trains you to manage your body's response (physiological response) through breathing techniques and relaxation methods. You will work with a therapist while machines are used to monitor your physical symptoms.  Stress management techniques. These include yoga, meditation, and exercise. A mental health specialist can help determine which treatment is best for you. Some people see improvement with one type of therapy. However, other people require a combination of therapies. Follow these instructions at home:  Take over-the-counter and prescription medicines only as told by your health care provider.  Try to maintain a normal routine.  Try to anticipate stressful situations and  allow extra time to manage them.  Practice any stress management or self-calming techniques as taught by your health care provider.  Do not punish yourself for setbacks or for not making progress.  Try to recognize your accomplishments, even if they are small.  Keep all follow-up visits as told by your health care provider. This is important. Contact a health care  provider if:  Your symptoms do not get better.  Your symptoms get worse.  You have signs of depression, such as: ? A persistently sad, cranky, or irritable mood. ? Loss of enjoyment in activities that used to bring you joy. ? Change in weight or eating. ? Changes in sleeping habits. ? Avoiding friends or family members. ? Loss of energy for normal tasks. ? Feelings of guilt or worthlessness. Get help right away if:  You have serious thoughts about hurting yourself or others. If you ever feel like you may hurt yourself or others, or have thoughts about taking your own life, get help right away. You can go to your nearest emergency department or call:  Your local emergency services (911 in the [place]  A suicide crisis helpline, such as the [facility] at (825) 456-7017. This is open 24 hours a day. Summary  Generalized anxiety disorder (GAD) is a mental health disorder that involves worry that is not triggered by a specific event.  People with GAD often worry excessively about many things in their lives, such as their health and family.  GAD may cause physical symptoms such as restlessness, trouble concentrating, sleep problems, frequent sweating, nausea, diarrhea, headaches, and trembling or muscle twitching.  A mental health specialist can help determine which treatment is best for you. Some people see improvement with one type of therapy. However, other people require a combination of therapies. This information is not intended to replace advice given to you by your health care provider.  Make sure you discuss any questions you have with your health care provider. Document Revised: 06/08/2017 Document Reviewed: 05/16/2016 Elsevier Patient Education  2020 [employer].

## 2020-06-10 NOTE — Telephone Encounter (Signed)
Pt c/o medication issue:  1. Name of Medication: apixaban (ELIQUIS) 5 MG TABS tablet  2. How are you currently taking this medication (dosage and times per day)? As directed   3. Are you having a reaction (difficulty breathing--STAT)? She is not sure  4. What is your medication issue? Patient said she has these spots on the back of her legs. She said they are dark and bigger than a silver dollar. She has had them several times but they come and go. When she had them in the past she would just clean them with alcohol and put cortisone on them. This is the first time she had to go to Urgent Care because the itching was unbearable.   The patient just wanted to know if this is a side effect from the medication or not.  She has pictures of how dark they were to start.

## 2020-06-10 NOTE — Assessment & Plan Note (Signed)
Not well controlled. Started patient on Seroquel 25 mg, at bed time, follow up in 2 weeks  Rx sent to pharmacy. Education with printed hand out given

## 2020-06-11 ENCOUNTER — Ambulatory Visit: Payer: BC Managed Care – PPO | Admitting: Oncology

## 2020-06-11 ENCOUNTER — Other Ambulatory Visit: Payer: BC Managed Care – PPO

## 2020-06-14 ENCOUNTER — Telehealth: Payer: Self-pay

## 2020-06-14 NOTE — Telephone Encounter (Signed)
  Incoming Patient Call  06/14/2020  What symptoms do you have? Rash that has spread on her left leg, upper thigh, elbow and wrist  How long have you been sick? Rash on right leg three weeks and left leg and arm about three days  Have you been seen for this problem? Yes by Ardeen Fillers last week, she does not understand why it is jumping from one leg to another   If your provider decides to give you a prescription, which pharmacy would you like for it to be sent to? [facility], she uses cortizone itch cream and it is not helping    Patient informed that this information will be sent to the clinical staff for review and that they should receive a follow up call.

## 2020-06-14 NOTE — Telephone Encounter (Signed)
Pt notified, she will call her PCP she states that it is better and almost gone in the other "spot". But has "popped up" on the back of her left leg.

## 2020-06-15 ENCOUNTER — Encounter: Payer: Self-pay | Admitting: Nurse Practitioner

## 2020-06-15 ENCOUNTER — Ambulatory Visit (INDEPENDENT_AMBULATORY_CARE_PROVIDER_SITE_OTHER): Payer: BC Managed Care – PPO | Admitting: Nurse Practitioner

## 2020-06-15 ENCOUNTER — Other Ambulatory Visit: Payer: Self-pay

## 2020-06-15 VITALS — BP 144/63 | HR 65 | Temp 98.1°F | Resp 20 | Ht 64.0 in | Wt 229.0 lb

## 2020-06-15 DIAGNOSIS — R21 Rash and other nonspecific skin eruption: Secondary | ICD-10-CM | POA: Diagnosis not present

## 2020-06-15 DIAGNOSIS — F4310 Post-traumatic stress disorder, unspecified: Secondary | ICD-10-CM | POA: Diagnosis not present

## 2020-06-15 DIAGNOSIS — F410 Panic disorder [episodic paroxysmal anxiety] without agoraphobia: Secondary | ICD-10-CM | POA: Diagnosis not present

## 2020-06-15 DIAGNOSIS — F312 Bipolar disorder, current episode manic severe with psychotic features: Secondary | ICD-10-CM | POA: Diagnosis not present

## 2020-06-15 NOTE — Telephone Encounter (Signed)
Please have patient schedule a visit in office to have rash evaluated. She may need a shot before oral medication sent to pharmacy

## 2020-06-15 NOTE — Progress Notes (Signed)
   Subjective:    Patient ID: Doris Smith, female    DOB: June 25, 1951, 69 y.o.   MRN: [id number]   Chief Complaint: Rash   HPI Patient come sin today c/o rash. Started over a month ago on her right lower leg and has spread to lefty upper arm. Very itchy.   Review of Systems  Respiratory: Negative.   Cardiovascular: Negative.   Skin: Positive for rash.  All other systems reviewed and are negative.      Objective:   Physical Exam Nursing note reviewed.  Constitutional:      Appearance: Normal appearance.  Cardiovascular:     Rate and Rhythm: Normal rate and regular rhythm.     Pulses: Normal pulses.     Heart sounds: Normal heart sounds.  Skin:    General: Skin is warm and dry.  Neurological:     General: No focal deficit present.     Mental Status: She is alert and oriented to person, place, and time.      BP (!) 144/63   Pulse 65   Temp 98.1 F (36.7 C) (Temporal)   Resp 20   Ht 5\' 4"  (1.626 m)   Wt 229 lb (103.9 kg)   SpO2 98%   BMI 39.31 kg/m         Assessment & Plan:  Doris Smith in today with chief complaint of Rash   1. Rash of body Avoid scratching Cool compresses - Alpha-Gal Panel - CBC with Differential/Platelet - Ambulatory referral to Dermatology    The above assessment and management plan was discussed with the patient. The patient verbalized understanding of and has agreed to the management plan. Patient is aware to call the clinic if symptoms persist or worsen. Patient is aware when to return to the clinic for a follow-up visit. Patient educated on when it is appropriate to go to the emergency department.   Doris Smith Done, FNP

## 2020-06-16 NOTE — Telephone Encounter (Signed)
Patient was seen yesterday in office for rash with PCP

## 2020-06-17 ENCOUNTER — Telehealth: Payer: Self-pay | Admitting: Nurse Practitioner

## 2020-06-17 NOTE — Telephone Encounter (Signed)
Pt spoke with [facility]MM about referral on Monday 12/6 -- all she knew is somewhere in [place]

## 2020-06-17 NOTE — Progress Notes (Signed)
[place]  Telephone:([phone]) [phone] Fax:(336) [phone]     ID: Doris Smith DOB: 12-Jan-1951  MR#: [id number]  ZDG#:[id number]  Patient Care Team: Chevis Pretty, FNP as PCP - General (Nurse Practitioner) Lendon Colonel, NP as PCP - Cardiology (Nurse Practitioner) Alphonsa Overall, MD as Consulting Physician (General Surgery) Motty Borin, Virgie Dad, MD as Consulting Physician (Oncology) Kyung Rudd, MD as Consulting Physician (Radiation Oncology) Elijio Miles, MD as Consulting Physician (Pulmonary Disease) Netta Cedars, MD as Consulting Physician (Orthopedic Surgery) Susa Day, MD as Consulting Physician (Orthopedic Surgery) Janeth Rase, NP as Nurse Practitioner (Adult Health Nurse Practitioner) Mauro Kaufmann, RN as Oncology Nurse Navigator Rockwell Germany, RN as Oncology Nurse Navigator Irene Limbo, MD as Consulting Physician (Plastic Surgery) Minus Breeding, MD as Consulting Physician (Cardiology) Chauncey Cruel, MD OTHER MD: Chapman Moss, psychiatry   CHIEF COMPLAINT: Estrogen receptor positive breast cancer (s/p right mastectomy)  CURRENT TREATMENT: anastrozole; ibandronate   INTERVAL HISTORY: [place] was scheduled today for follow-up of her estrogen receptor positive breast cancer.       Terence's last bone density screening on 10/13/2016, showed a T-score of -2.1, which is considered osteopenic.   Since her last visit here, she has not undergone any additional studies. She is behind on her annual mammography (uni left), which was due in 08/2019 from [facility].    REVIEW OF SYSTEMS: [place]    COVID 19 VACCINATION STATUS: J&J May 2021   HISTORY OF CURRENT ILLNESS: From the original intake note:  "[name]" presented to her PCP, Dr. Hassell Done, with right breast pain, fullness, and nipple inversion since November 2019. She proceeded to undergo bilateral diagnostic mammography with tomography and right breast  ultrasonography at [facility] on 07/31/2018 showing: findings compatible with multicentric breast cancer spanning at least the upper inner and upper outer quadrants of the right breast; normal right axilla. Palpable firmness of approximately 1.5 cm was noted in the 9 o'clock position, which was also seen on ultrasound with indisctinct margins measuring 2.7 x 2.3 x 2.2 cm. At 1 o'clock, a mass with poorly defined margins measures 1.9 x 1 x 0.8 cm. Additional poorly defined masses were seen in the 12 o'clock retroareolar region.  Accordingly on 08/02/2018 she proceeded to biopsy of the right breast area in question. The pathology ([id number]) from this procedure showed: invasive mammary carcinoma at 9 o'clock and 1 o'clock; e-cadherin negative, consistent with lobular phenotype; grade 2-3. Prognostic indicators significant for: estrogen receptor, 90% positive and progesterone receptor, 1% positive, both with strong staining intensity. Proliferation marker Ki67 at 1%. HER2 equivocal by immunohistochemistry, 2+ but negative by fluorescent in situ hybridization with a signals ratio 1.04 and number per cell 1.2.   The patient's subsequent history is as detailed below.   PAST MEDICAL HISTORY: Past Medical History:  Diagnosis Date  . A-fib ([place]) 11/2012   PAF  . Anxiety    takes Ativan  . Asthma 04/07/2011   dx  . Bipolar disorder ([place])   . Cancer [facility])    Right breast  . Depression   . Early cataracts, bilateral   . Fatty liver   . Fibromyalgia   . GERD (gastroesophageal reflux disease)   . Irritable bowel syndrome   . Mental disorder    dx bipolar  . PONV (postoperative nausea and vomiting)   . Sleep apnea 04/2011    does not wear CPAP  She has chronic sinus headaches, hiatal hernia   PAST SURGICAL HISTORY: Past Surgical  History:  Procedure Laterality Date  . ABDOMINAL HYSTERECTOMY    . COLONOSCOPY    . DILATION AND CURETTAGE OF UTERUS  1981   abnormal pap  . KNEE ARTHROSCOPY  Left 2007   x 2  . LUMBAR LAMINECTOMY/DECOMPRESSION MICRODISCECTOMY  05/31/2011   Procedure: LUMBAR LAMINECTOMY/DECOMPRESSION MICRODISCECTOMY;  Surgeon: Johnn Hai;  Location: [facility];  Service: Orthopedics;  Laterality: Left;  Decompression Lumbar four to five and  Lumbar five to Sacral one on Left  (X-Ray)  . MASTECTOMY W/ SENTINEL NODE BIOPSY Right 09/16/2018   Procedure: RIGHT MASTECTOMY WITH RIGHT AXILLARY SENTINEL LYMPH NODE BIOPSY;  Surgeon: Alphonsa Overall, MD;  Location: [facility];  Service: General;  Laterality: Right;  . PARTIAL HYSTERECTOMY  1982  . PORTACATH PLACEMENT Left 10/31/2018   Procedure: INSERTION PORT-A-CATH WITH ULTRASOUND;  Surgeon: Alphonsa Overall, MD;  Location: [facility];  Service: General;  Laterality: Left;  . TONSILLECTOMY     as child  . TOTAL KNEE ARTHROPLASTY Left 12/18/2005    FAMILY HISTORY Family History  Problem Relation Age of Onset  . Anesthesia problems Mother   . Heart disease Mother        CHF, atrial fib  . Heart failure Mother   . Anesthesia problems Sister   . Colon polyps Father   . Heart disease Father        "Fluid around the heart"  . Hypertension Sister   . Breast cancer Maternal Aunt   . Colon cancer Maternal Aunt   . Prostate cancer Neg Hx   . Ovarian cancer Neg Hx    As of February 2019, patient father is alive at 76 years old. Patient mother is alive at 6 years old. She notes a family hx of breast cancer. A maternal aunt was diagnosed with breast cancer, but [name] is unsure if it was in one or both breasts. She has 3 siblings, 3 sisters and 0 brothers.   GYNECOLOGIC HISTORY:  No LMP recorded. Patient is postmenopausal. Menarche: 69 years old Age at first live birth: 69 years old [place] P 2 LMP about 42 years ago Contraceptive no HRT no  Hysterectomy? partial So? no   SOCIAL HISTORY: [name] worked in [employer] for 15 years. She is on disability because her back, knees, and nerves. Her husband, [name], has been  at [employer] 43 years as a Freight forwarder.  Even though their home is in [place] they are actually living in [place] in an apartment while her husband works there.  Son Legrand Como, age 72, works as a Chief Strategy Officer in [place], [place]. Son Shanon Brow, age 34, works as a Building control surveyor in [place], [place]. They have 4 grandchildren, 2 great-grandchildren with 2 more on the way. She attends a [employer].   ADVANCED DIRECTIVES: In the absence of any documents to the contrary the patient's husband is her healthcare power of attorney   HEALTH MAINTENANCE: Social History   Tobacco Use  . Smoking status: Never Smoker  . Smokeless tobacco: Never Used  Vaping Use  . Vaping Use: Never used  Substance Use Topics  . Alcohol use: No  . Drug use: No     Colonoscopy: 2019  PAP: 11/03/2014, normal  Bone density: 10/13/2016, T-score of -2.1   Allergies  Allergen Reactions  . Iohexol     "over 20 years ago" had reaction "hurting in arm and heart"-Done in [place], [place].Marland Kitchenphysician stopped CT at that time.  . Codeine Other (See Comments)    Makes feel  like she is wild   . Palonosetron Other (See Comments)    headache  . Prednisone Other (See Comments)    Makes me very irritable  . Sulfonamide Derivatives Other (See Comments)    Upset stomach  . Celecoxib Nausea And Vomiting  . Sulfa Antibiotics Nausea And Vomiting  . Vitamin B12 Rash    Current Outpatient Medications  Medication Sig Dispense Refill  . anastrozole (ARIMIDEX) 1 MG tablet Take 1 tablet by mouth once daily 30 tablet 0  . apixaban (ELIQUIS) 5 MG TABS tablet Take 1 tablet (5 mg total) by mouth 2 (two) times daily. 180 tablet 3  . benzonatate (TESSALON PERLES) 100 MG capsule Take 1 capsule (100 mg total) by mouth 3 (three) times daily as needed for cough. 20 capsule 0  . clotrimazole-betamethasone (LOTRISONE) cream APPLY  CREAM TOPICALLY TWICE DAILY 45 g 0  . esomeprazole (NEXIUM) 40 MG capsule Take 1 capsule by mouth once daily 90 capsule 0  . gabapentin  (NEURONTIN) 300 MG capsule Take 1 capsule (300 mg total) by mouth 3 (three) times daily. 180 capsule 1  . hydrOXYzine (ATARAX/VISTARIL) 10 MG tablet Take 1 tablet (10 mg total) by mouth 3 (three) times daily as needed for itching. 30 tablet 0  . ibandronate (BONIVA) 150 MG tablet Take 1 tablet (150 mg total) by mouth every 30 (thirty) days. Take in the morning with a full glass of water, on an empty stomach, and do not take anything else by mouth or lie down for the next 30 min. 3 tablet 4  . lamoTRIgine (LAMICTAL) 100 MG tablet Take 1 tablet (100 mg total) by mouth in the morning, at noon, and at bedtime. 180 tablet 1  . loratadine (CLARITIN) 10 MG tablet Take 10 mg by mouth daily.    . metoprolol tartrate (LOPRESSOR) 25 MG tablet Take 1 tablet (25 mg total) by mouth 2 (two) times daily. 180 tablet 3  . midodrine (PROAMATINE) 2.5 MG tablet Take 1 tablet (2.5 mg total) by mouth 3 (three) times daily with meals. 270 tablet 3  . promethazine-dextromethorphan (PROMETHAZINE-DM) 6.25-15 MG/5ML syrup TAKE 5 MILLILITERS BY MOUTH 4 TIMES DAILY AS NEEDED FOR COUGH    . QUEtiapine (SEROQUEL) 25 MG tablet Take 1 tablet (25 mg total) by mouth at bedtime. 30 tablet 0  . Vilazodone HCl (VIIBRYD) 40 MG TABS Take 1 tablet (40 mg total) by mouth daily. 90 tablet 1   No current facility-administered medications for this visit.   Facility-Administered Medications Ordered in Other Visits  Medication Dose Route Frequency Provider Last Rate Last Admin  . heparin lock flush 100 unit/mL  500 Units Intravenous Once Carlynn Leduc, Virgie Dad, MD      . sodium chloride flush (NS) 0.9 % injection 10 mL  10 mL Intravenous PRN Joyceann Kruser, Virgie Dad, MD        OBJECTIVE: white woman using a cane  There were no vitals filed for this visit. Wt Readings from Last 3 Encounters:  06/15/20 229 lb (103.9 kg)  06/10/20 228 lb (103.4 kg)  05/13/20 226 lb (102.5 kg)   There is no height or weight on file to calculate BMI.    ECOG FS:2 -  Symptomatic, <50% confined to bed  LAB RESULTS:  CMP     Component Value Date/Time   NA 140 02/03/2020 1049   K 4.4 02/03/2020 1049   CL 103 02/03/2020 1049   CO2 27 02/03/2020 1049   GLUCOSE 82 02/03/2020 1049   GLUCOSE  93 01/20/2020 1112   BUN 9 02/03/2020 1049   CREATININE 0.86 02/03/2020 1049   CREATININE 0.90 12/19/2018 1434   CALCIUM 9.0 02/03/2020 1049   PROT 6.4 02/03/2020 1049   ALBUMIN 4.1 02/03/2020 1049   AST 15 02/03/2020 1049   AST 14 (L) 08/14/2018 0758   ALT 11 02/03/2020 1049   ALT 10 08/14/2018 0758   ALKPHOS 112 02/03/2020 1049   BILITOT 0.4 02/03/2020 1049   BILITOT 0.5 08/14/2018 0758   GFRNONAA 71 02/03/2020 1049   GFRNONAA >60 12/19/2018 1434   GFRAA 81 02/03/2020 1049   GFRAA >60 12/19/2018 1434    No results found for: TOTALPROTELP, ALBUMINELP, A1GS, A2GS, BETS, BETA2SER, GAMS, MSPIKE, SPEI  No results found for: KPAFRELGTCHN, LAMBDASER, KAPLAMBRATIO  Lab Results  Component Value Date   WBC 6.7 06/15/2020   NEUTROABS 4.4 06/15/2020   HGB 11.9 06/15/2020   HCT 36.6 06/15/2020   MCV 85 06/15/2020   PLT 218 06/15/2020   No results found for: LABCA2  No components found for: [id number]  No results for input(s): INR in the last 168 hours.  No results found for: LABCA2  No results found for: FOY774  No results found for: JOI786  No results found for: VEH209  No results found for: CA2729  No components found for: HGQUANT  No results found for: CEA1 / No results found for: CEA1   No results found for: AFPTUMOR  No results found for: CHROMOGRNA  No results found for: HGBA, HGBA2QUANT, HGBFQUANT, HGBSQUAN (Hemoglobinopathy evaluation)   No results found for: LDH  No results found for: IRON, TIBC, IRONPCTSAT (Iron and TIBC)  No results found for: FERRITIN  Urinalysis    Component Value Date/Time   COLORURINE YELLOW 12/19/2018 1355   APPEARANCEUR Clear 02/03/2020 1045   LABSPEC 1.016 12/19/2018 1355   PHURINE 5.0  12/19/2018 1355   GLUCOSEU Negative 02/03/2020 1045   HGBUR NEGATIVE 12/19/2018 1355   BILIRUBINUR Negative 02/03/2020 [place] 12/19/2018 1355   PROTEINUR Negative 02/03/2020 [place] 12/19/2018 1355   UROBILINOGEN negative 11/03/2014 1039   UROBILINOGEN 0.2 05/25/2011 0923   NITRITE Negative 02/03/2020 1045   NITRITE NEGATIVE 12/19/2018 1355   LEUKOCYTESUR Negative 02/03/2020 1045   LEUKOCYTESUR SMALL (A) 12/19/2018 1355    STUDIES: No results found.   ELIGIBLE FOR AVAILABLE RESEARCH PROTOCOL: no   ASSESSMENT: 69 y.o. [place], [place] woman status post right breast biopsy 08/02/2018 for a clinical T3 N0, stage IIA invasive lobular carcinoma, grade 2, estrogen receptor strongly positive, progesterone receptor 1% positive, with no HER-2 amplification and an MIB-1 of 1%.  (a) CT scan of the head and chest, without contrast 08/23/2018 showed nonspecific 0.4 cm left lower lobe pulmonary nodule, no definitive metastatic disease  (b) bone scan 08/23/2018 shows multiple spinal areas of abnormal uptake, but  (c) total spinal MRI 09/03/2018 finds bone scan findings to be secondary to degenerative disease, no evidence of metastatic disease.  (1) status post right mastectomy and sentinel lymph node sampling 09/16/2018 showing a pT3 pN1(mic), stage IIA-IIIA invasive lobular breast cancer, grade 2, with 1 of 5 sampled lymph nodes involved by micrometastatic deposit, with extra nodular extension; ample margins  (2)  MammaPrint "high risk" suggests a 5-year metastasis free survival of 93% with chemotherapy, with an absolute chemotherapy benefit in the greater than 12% range  (3) adjuvant radiation 12/31/2018-02/19/2019:  The right chest wall and regional nodes were treated to 50.4 Gy in 28 fractions followed  by a 10 Gy boost over 5 fractions.   (4) anastrozole started neoadjuvantly (on 08/14/2018), discontinued 10/14/2018 in preparation for chemotherapy, resumed October  2020  (a) bone density 10/13/2016 showed osteopenia with a lowest score at -2.1  (b) bone density JAN 2021  (c) ibandronate/Boniva started January 2021  (5) [facility] testing on mastectomy sample (09/16/2018) showed stable MSI and proficient mismatch repair status, with a low mutational burden; BRCA 1 and 2 were not mutated, PI K3 was not mutated, ER B B2 was not mutated, and a KT 1 was not mutated.  The androgen receptor was positive (90% at 2+) and there was a pathogenic PTEN variant an excellent 3 (c.209+1G>A)  (6) adjuvant chemotherapy consisting of cyclophosphamide, methotrexate, fluorouracil (CMF) started 11/05/2018, repeated every 21 days x 8, last dose 03/02/2019  (a) methotrexate was omitted during concurrent radiation (cycles 4, 5 and 6)  (7) continuing on anastrozole and ibandronate   PLAN: [name] did not show for her 06/18/2020 appointment.  Her appointment here has been rescheduled for July 22, 2020.  Virgie Dad. Harrell Niehoff, MD  06/19/20 8:17 AM Medical Oncology and Hematology [facility] [place], [place] [address] Tel. [phone]    Fax. [phone]   I, Wilburn Mylar, am acting as scribe for Dr. Virgie Dad. Monic Engelmann.  I, Lurline Del MD, have reviewed the above documentation for accuracy and completeness, and I agree with the above.   *Total Encounter Time as defined by the Centers for Medicare and Medicaid Services includes, in addition to the face-to-face time of a patient visit (documented in the note above) non-face-to-face time: obtaining and reviewing outside history, ordering and reviewing medications, tests or procedures, care coordination (communications with other health care professionals or caregivers) and documentation in the medical record.

## 2020-06-18 ENCOUNTER — Inpatient Hospital Stay (HOSPITAL_BASED_OUTPATIENT_CLINIC_OR_DEPARTMENT_OTHER): Payer: BC Managed Care – PPO | Admitting: Oncology

## 2020-06-18 ENCOUNTER — Inpatient Hospital Stay: Payer: BC Managed Care – PPO | Attending: Nurse Practitioner

## 2020-06-18 DIAGNOSIS — Z170 Estrogen receptor positive status [ER+]: Secondary | ICD-10-CM

## 2020-06-18 DIAGNOSIS — C50411 Malignant neoplasm of upper-outer quadrant of right female breast: Secondary | ICD-10-CM

## 2020-06-21 DIAGNOSIS — L309 Dermatitis, unspecified: Secondary | ICD-10-CM | POA: Diagnosis not present

## 2020-06-22 LAB — CBC WITH DIFFERENTIAL/PLATELET
Basophils Absolute: 0.1 10*3/uL (ref 0.0–0.2)
Basos: 1 %
EOS (ABSOLUTE): 0.3 10*3/uL (ref 0.0–0.4)
Eos: 5 %
Hematocrit: 36.6 % (ref 34.0–46.6)
Hemoglobin: 11.9 g/dL (ref 11.1–15.9)
Immature Grans (Abs): 0 10*3/uL (ref 0.0–0.1)
Immature Granulocytes: 0 %
Lymphocytes Absolute: 1 10*3/uL (ref 0.7–3.1)
Lymphs: 15 %
MCH: 27.6 pg (ref 26.6–33.0)
MCHC: 32.5 g/dL (ref 31.5–35.7)
MCV: 85 fL (ref 79–97)
Monocytes Absolute: 0.9 10*3/uL (ref 0.1–0.9)
Monocytes: 13 %
Neutrophils Absolute: 4.4 10*3/uL (ref 1.4–7.0)
Neutrophils: 66 %
Platelets: 218 10*3/uL (ref 150–450)
RBC: 4.31 x10E6/uL (ref 3.77–5.28)
RDW: 14.9 % (ref 11.7–15.4)
WBC: 6.7 10*3/uL (ref 3.4–10.8)

## 2020-06-22 LAB — ALPHA-GAL PANEL
Alpha Gal IgE*: <0.1 kU/L (ref <0.10)
Beef (Bos spp) IgE: <0.1 kU/L (ref <0.35)
Class Interpretation: 0
Class Interpretation: 0
Class Interpretation: 0
Lamb/Mutton (Ovis spp) IgE: <0.1 kU/L (ref <0.35)
Pork (Sus spp) IgE: <0.1 kU/L (ref <0.35)

## 2020-06-24 ENCOUNTER — Other Ambulatory Visit: Payer: Self-pay

## 2020-06-24 ENCOUNTER — Encounter: Payer: Self-pay | Admitting: Nurse Practitioner

## 2020-06-24 ENCOUNTER — Ambulatory Visit (INDEPENDENT_AMBULATORY_CARE_PROVIDER_SITE_OTHER): Payer: BC Managed Care – PPO | Admitting: Nurse Practitioner

## 2020-06-24 VITALS — BP 112/61 | HR 60 | Temp 98.1°F | Resp 20 | Ht 64.0 in | Wt 230.0 lb

## 2020-06-24 DIAGNOSIS — J0100 Acute maxillary sinusitis, unspecified: Secondary | ICD-10-CM | POA: Diagnosis not present

## 2020-06-24 MED ORDER — AMOXICILLIN-POT CLAVULANATE 875-125 MG PO TABS: 1.0000 | ORAL_TABLET | Freq: Two times a day (BID) | ORAL | 0 refills | Status: DC

## 2020-06-24 NOTE — Patient Instructions (Signed)

## 2020-06-24 NOTE — Progress Notes (Signed)
Subjective:    Patient ID: Doris Smith, female    DOB: [birth year], 68 y.o.   MRN: [id number]   Chief Complaint: Sinus Problem    HPI Patient comes in today c/o congestion and cough that started 5 days ago. Eyes are swollen with pain behind them.    Review of Systems  Constitutional: Positive for fatigue. Negative for chills and fever.  HENT: Positive for congestion, facial swelling, rhinorrhea, sinus pressure and sinus pain. Negative for ear pain.   Respiratory: Positive for cough. Negative for shortness of breath.   Cardiovascular: Negative.   Gastrointestinal: Negative.   Neurological: Negative.   Psychiatric/Behavioral: Negative.   All other systems reviewed and are negative.      Objective:   Physical Exam Vitals and nursing note reviewed.  Constitutional:      General: She is not in acute distress.    Appearance: Normal appearance. She is well-developed and well-nourished.  HENT:     Head: Normocephalic.     Nose: Nose normal.     Mouth/Throat:     Mouth: Oropharynx is clear and moist.  Eyes:     Extraocular Movements: EOM normal.     Pupils: Pupils are equal, round, and reactive to light.  Neck:     Vascular: No carotid bruit or JVD.  Cardiovascular:     Rate and Rhythm: Normal rate and regular rhythm.     Pulses: Intact distal pulses.     Heart sounds: Normal heart sounds.  Pulmonary:     Effort: Pulmonary effort is normal. No respiratory distress.     Breath sounds: Normal breath sounds. No wheezing or rales.  Chest:     Chest wall: No tenderness.  Abdominal:     General: Bowel sounds are normal. There is no distension or abdominal bruit. Aorta is normal.     Palpations: Abdomen is soft. There is no hepatomegaly, splenomegaly, mass or pulsatile mass.     Tenderness: There is no abdominal tenderness.  Musculoskeletal:        General: No edema. Normal range of motion.     Cervical back: Normal range of motion and neck supple.  Lymphadenopathy:      Cervical: No cervical adenopathy.  Skin:    General: Skin is warm and dry.  Neurological:     Mental Status: She is alert and oriented to person, place, and time.     Deep Tendon Reflexes: Reflexes are normal and symmetric.  Psychiatric:        Mood and Affect: Mood and affect normal.        Behavior: Behavior normal.        Thought Content: Thought content normal.        Judgment: Judgment normal.     BP 112/61   Pulse 60   Temp 98.1 F (36.7 C) (Temporal)   Resp 20   Ht 5\' 4"  (1.626 m)   Wt 230 lb (104.3 kg)   SpO2 98%   BMI 39.48 kg/m       Assessment & Plan:  Doris Smith in today with chief complaint of Sinus Problem   1. Acute non-recurrent maxillary sinusitis 1. Take meds as prescribed 2. Use a cool mist humidifier especially during the winter months and when heat has been humid. 3. Use saline nose sprays frequently 4. Saline irrigations of the nose can be very helpful if done frequently.  * 4X daily for 1 week*  * Use of a nettie pot can  be helpful with this. Follow directions with this* 5. Drink plenty of fluids 6. Keep thermostat turn down low 7.For any cough or congestion  Use plain Mucinex- regular strength or max strength is fine   * Children- consult with Pharmacist for dosing 8. For fever or aces or pains- take tylenol or ibuprofen appropriate for age and weight.  * for fevers greater than 101 orally you may alternate ibuprofen and tylenol every  3 hours.   Meds ordered this encounter  Medications  . amoxicillin-clavulanate (AUGMENTIN) 875-125 MG tablet    Sig: Take 1 tablet by mouth 2 (two) times daily.    Dispense:  14 tablet    Refill:  0    Order Specific Question:   Supervising Provider    Answer:   Caryl Pina A [[id number]]       The above assessment and management plan was discussed with the patient. The patient verbalized understanding of and has agreed to the management plan. Patient is aware to call the clinic if symptoms  persist or worsen. Patient is aware when to return to the clinic for a follow-up visit. Patient educated on when it is appropriate to go to the emergency department.   [name] Done, FNP

## 2020-07-11 ENCOUNTER — Other Ambulatory Visit: Payer: Self-pay | Admitting: Nurse Practitioner

## 2020-07-11 DIAGNOSIS — K219 Gastro-esophageal reflux disease without esophagitis: Secondary | ICD-10-CM

## 2020-07-20 DIAGNOSIS — F4310 Post-traumatic stress disorder, unspecified: Secondary | ICD-10-CM | POA: Diagnosis not present

## 2020-07-20 DIAGNOSIS — F410 Panic disorder [episodic paroxysmal anxiety] without agoraphobia: Secondary | ICD-10-CM | POA: Diagnosis not present

## 2020-07-20 DIAGNOSIS — F312 Bipolar disorder, current episode manic severe with psychotic features: Secondary | ICD-10-CM | POA: Diagnosis not present

## 2020-07-21 NOTE — Progress Notes (Signed)
[place]  Telephone:(336) [phone] Fax:(336) [phone]     ID: Chandi Nicklin Frenette DOB: [birth year]  MR#: [id number]  KJZ#:[id number]  Patient Care Team: Chevis Pretty, FNP as PCP - General (Nurse Practitioner) Lendon Colonel, NP as PCP - Cardiology (Nurse Practitioner) Alphonsa Overall, MD as Consulting Physician (General Surgery) Lorin Gawron, Virgie Dad, MD as Consulting Physician (Oncology) Kyung Rudd, MD as Consulting Physician (Radiation Oncology) Elijio Miles, MD as Consulting Physician (Pulmonary Disease) Netta Cedars, MD as Consulting Physician (Orthopedic Surgery) Susa Day, MD as Consulting Physician (Orthopedic Surgery) Janeth Rase, NP as Nurse Practitioner (Adult Health Nurse Practitioner) Mauro Kaufmann, RN as Oncology Nurse Navigator Rockwell Germany, RN as Oncology Nurse Navigator Irene Limbo, MD as Consulting Physician (Plastic Surgery) Minus Breeding, MD as Consulting Physician (Cardiology) Chauncey Cruel, MD OTHER MD: Chapman Moss, psychiatry   CHIEF COMPLAINT: Estrogen receptor positive breast cancer (s/p right mastectomy)  CURRENT TREATMENT: anastrozole; ibandronate   INTERVAL HISTORY: Doris Smith returns today for follow-up of her estrogen receptor positive breast cancer.   She continues on anastrozole.  She does not report any significant side effects from this medication.  She also continues on ibandronate.  She is taking it appropriately, following instructions and so far has not had side effects from it.  Cindy's last bone density screening on 10/13/2016, showed a T-score of -2.1, which is considered osteopenic.   Since her last visit here, she has not undergone any additional studies. She is behind on her annual mammography (uni left), which was due in 08/2019 from [facility].    REVIEW OF SYSTEMS: Doris Smith is very depressed.  She lost her mother November 2021.  She got depressed when she visited her dad  over the holidays.  She has been referred for counseling by her psychiatrist and she is looking forward to that.  That is scheduled for February 03.  Currently she is not exercising.  She does feel she has good support from her husband.   COVID 19 VACCINATION STATUS: J&J May 2021, status post booster December 2021   HISTORY OF CURRENT ILLNESS: From the original intake note:  "Doris Smith" presented to her PCP, Dr. Hassell Done, with right breast pain, fullness, and nipple inversion since November 2019. She proceeded to undergo bilateral diagnostic mammography with tomography and right breast ultrasonography at [facility] on 07/31/2018 showing: findings compatible with multicentric breast cancer spanning at least the upper inner and upper outer quadrants of the right breast; normal right axilla. Palpable firmness of approximately 1.5 cm was noted in the 9 o'clock position, which was also seen on ultrasound with indisctinct margins measuring 2.7 x 2.3 x 2.2 cm. At 1 o'clock, a mass with poorly defined margins measures 1.9 x 1 x 0.8 cm. Additional poorly defined masses were seen in the 12 o'clock retroareolar region.  Accordingly on 08/02/2018 she proceeded to biopsy of the right breast area in question. The pathology ([id number]) from this procedure showed: invasive mammary carcinoma at 9 o'clock and 1 o'clock; e-cadherin negative, consistent with lobular phenotype; grade 2-3. Prognostic indicators significant for: estrogen receptor, 90% positive and progesterone receptor, 1% positive, both with strong staining intensity. Proliferation marker Ki67 at 1%. HER2 equivocal by immunohistochemistry, 2+ but negative by fluorescent in situ hybridization with a signals ratio 1.04 and number per cell 1.2.   The patient's subsequent history is as detailed below.   PAST MEDICAL HISTORY: Past Medical History:  Diagnosis Date  . A-fib ([place]) 11/2012   PAF  . Anxiety  takes Ativan  . Asthma 04/07/2011   dx  .  Bipolar disorder ([place])   . Cancer [facility])    Right breast  . Depression   . Early cataracts, bilateral   . Fatty liver   . Fibromyalgia   . GERD (gastroesophageal reflux disease)   . Irritable bowel syndrome   . Mental disorder    dx bipolar  . PONV (postoperative nausea and vomiting)   . Sleep apnea 04/2011    does not wear CPAP  She has chronic sinus headaches, hiatal hernia   PAST SURGICAL HISTORY: Past Surgical History:  Procedure Laterality Date  . ABDOMINAL HYSTERECTOMY    . COLONOSCOPY    . DILATION AND CURETTAGE OF UTERUS  1981   abnormal pap  . KNEE ARTHROSCOPY Left 2007   x 2  . LUMBAR LAMINECTOMY/DECOMPRESSION MICRODISCECTOMY  05/31/2011   Procedure: LUMBAR LAMINECTOMY/DECOMPRESSION MICRODISCECTOMY;  Surgeon: Johnn Hai;  Location: [facility];  Service: Orthopedics;  Laterality: Left;  Decompression Lumbar four to five and  Lumbar five to Sacral one on Left  (X-Ray)  . MASTECTOMY W/ SENTINEL NODE BIOPSY Right 09/16/2018   Procedure: RIGHT MASTECTOMY WITH RIGHT AXILLARY SENTINEL LYMPH NODE BIOPSY;  Surgeon: Alphonsa Overall, MD;  Location: [facility];  Service: General;  Laterality: Right;  . PARTIAL HYSTERECTOMY  1982  . PORTACATH PLACEMENT Left 10/31/2018   Procedure: INSERTION PORT-A-CATH WITH ULTRASOUND;  Surgeon: Alphonsa Overall, MD;  Location: [facility];  Service: General;  Laterality: Left;  . TONSILLECTOMY     as child  . TOTAL KNEE ARTHROPLASTY Left 12/18/2005    FAMILY HISTORY Family History  Problem Relation Age of Onset  . Anesthesia problems Mother   . Heart disease Mother        CHF, atrial fib  . Heart failure Mother   . Anesthesia problems Sister   . Colon polyps Father   . Heart disease Father        "Fluid around the heart"  . Hypertension Sister   . Breast cancer Maternal Aunt   . Colon cancer Maternal Aunt   . Prostate cancer Neg Hx   . Ovarian cancer Neg Hx   As of February 2019, patient father is alive at 10 years old. Patient  mother is alive at 69 years old. She notes a family hx of breast cancer. A maternal aunt was diagnosed with breast cancer, but Doris Smith is unsure if it was in one or both breasts. She has 3 siblings, 3 sisters and 0 brothers.   GYNECOLOGIC HISTORY:  No LMP recorded. Patient is postmenopausal. Menarche: 70 years old Age at first live birth: 70 years old [place] P 2 LMP about 42 years ago Contraceptive no HRT no  Hysterectomy? partial So? no   SOCIAL HISTORY:  Doris Smith worked in [employer] for 15 years. She is on disability because her back, knees, and nerves. Her husband, [name], has been at [employer] 43 years as a Freight forwarder.  Even though their home is in [place] they are actually living in [place] in an apartment while her husband works there.  Son Legrand Como, age 52, works as a Chief Strategy Officer in [place], [place]. Son Shanon Brow, age 21, works as a Building control surveyor in [place], [place]. They have 4 grandchildren, 2 great-grandchildren with 2 more on the way. She attends a [employer].   ADVANCED DIRECTIVES: In the absence of any documents to the contrary the patient's husband is her healthcare power of attorney   HEALTH MAINTENANCE: Social  History   Tobacco Use  . Smoking status: Never Smoker  . Smokeless tobacco: Never Used  Vaping Use  . Vaping Use: Never used  Substance Use Topics  . Alcohol use: No  . Drug use: No     Colonoscopy: 2019  PAP: 11/03/2014, normal  Bone density: 10/13/2016, T-score of -2.1   Allergies  Allergen Reactions  . Iohexol     "over 20 years ago" had reaction "hurting in arm and heart"-Done in [place], [place].Marland Kitchenphysician stopped CT at that time.  . Codeine Other (See Comments)    Makes feel like she is wild   . Palonosetron Other (See Comments)    headache  . Prednisone Other (See Comments)    Makes me very irritable  . Sulfonamide Derivatives Other (See Comments)    Upset stomach  . Celecoxib Nausea And Vomiting  . Sulfa Antibiotics Nausea And Vomiting  . Vitamin B12 Rash     Current Outpatient Medications  Medication Sig Dispense Refill  . anastrozole (ARIMIDEX) 1 MG tablet Take 1 tablet (1 mg total) by mouth daily. 90 tablet 4  . apixaban (ELIQUIS) 5 MG TABS tablet Take 1 tablet (5 mg total) by mouth 2 (two) times daily. 180 tablet 3  . benzonatate (TESSALON PERLES) 100 MG capsule Take 1 capsule (100 mg total) by mouth 3 (three) times daily as needed for cough. 20 capsule 0  . clotrimazole-betamethasone (LOTRISONE) cream APPLY  CREAM TOPICALLY TWICE DAILY 45 g 0  . esomeprazole (NEXIUM) 40 MG capsule Take 1 capsule by mouth once daily 90 capsule 0  . gabapentin (NEURONTIN) 300 MG capsule Take 1 capsule (300 mg total) by mouth 3 (three) times daily. 180 capsule 1  . hydrOXYzine (ATARAX/VISTARIL) 10 MG tablet Take 1 tablet (10 mg total) by mouth 3 (three) times daily as needed for itching. 30 tablet 0  . ibandronate (BONIVA) 150 MG tablet Take 1 tablet (150 mg total) by mouth every 30 (thirty) days. Take in the morning with a full glass of water, on an empty stomach, and do not take anything else by mouth or lie down for the next 30 min. 3 tablet 4  . lamoTRIgine (LAMICTAL) 100 MG tablet Take 1 tablet (100 mg total) by mouth in the morning, at noon, and at bedtime. 180 tablet 1  . loratadine (CLARITIN) 10 MG tablet Take 10 mg by mouth daily.    . metoprolol tartrate (LOPRESSOR) 25 MG tablet Take 1 tablet (25 mg total) by mouth 2 (two) times daily. 180 tablet 3  . midodrine (PROAMATINE) 2.5 MG tablet Take 1 tablet (2.5 mg total) by mouth 3 (three) times daily with meals. 270 tablet 3  . promethazine-dextromethorphan (PROMETHAZINE-DM) 6.25-15 MG/5ML syrup TAKE 5 MILLILITERS BY MOUTH 4 TIMES DAILY AS NEEDED FOR COUGH    . QUEtiapine (SEROQUEL) 25 MG tablet Take 1 tablet (25 mg total) by mouth at bedtime. 30 tablet 0  . triamcinolone (KENALOG) 0.1 % Apply 1 application topically 2 (two) times daily.    . Vilazodone HCl (VIIBRYD) 40 MG TABS Take 1 tablet (40 mg total)  by mouth daily. 90 tablet 1   No current facility-administered medications for this visit.   Facility-Administered Medications Ordered in Other Visits  Medication Dose Route Frequency Provider Last Rate Last Admin  . heparin lock flush 100 unit/mL  500 Units Intravenous Once Cary Wilford, Virgie Dad, MD      . sodium chloride flush (NS) 0.9 % injection 10 mL  10 mL Intravenous PRN Ingram Onnen,  Virgie Dad, MD        OBJECTIVE: white woman who appears stated age  26:   07/22/20 0927  BP: (!) 129/51  Pulse: 62  Resp: 18  Temp: 97.7 F (36.5 C)  SpO2: 98%   Wt Readings from Last 3 Encounters:  07/22/20 231 lb 1.6 oz (104.8 kg)  06/24/20 230 lb (104.3 kg)  06/15/20 229 lb (103.9 kg)   Body mass index is 39.67 kg/m.    ECOG FS:2 - Symptomatic, <50% confined to bed  Sclerae unicteric, EOMs intact Wearing a mask No cervical or supraclavicular adenopathy Lungs no rales or rhonchi Heart regular rate and rhythm Abd soft, nontender, positive bowel sounds MSK no focal spinal tenderness, no upper extremity lymphedema Neuro: nonfocal, well oriented, appropriate affect Breasts: Status post right mastectomy.  There is no evidence of local recurrence.  Left breast is benign.  Both axillae are benign.   LAB RESULTS:  CMP     Component Value Date/Time   NA 142 07/22/2020 0855   NA 140 02/03/2020 1049   K 4.3 07/22/2020 0855   CL 108 07/22/2020 0855   CO2 27 07/22/2020 0855   GLUCOSE 124 (H) 07/22/2020 0855   BUN 14 07/22/2020 0855   BUN 9 02/03/2020 1049   CREATININE 0.91 07/22/2020 0855   CREATININE 0.90 12/19/2018 1434   CALCIUM 9.0 07/22/2020 0855   PROT 6.9 07/22/2020 0855   PROT 6.4 02/03/2020 1049   ALBUMIN 3.4 (L) 07/22/2020 0855   ALBUMIN 4.1 02/03/2020 1049   AST 13 (L) 07/22/2020 0855   AST 14 (L) 08/14/2018 0758   ALT 8 07/22/2020 0855   ALT 10 08/14/2018 0758   ALKPHOS 94 07/22/2020 0855   BILITOT 0.4 07/22/2020 0855   BILITOT 0.4 02/03/2020 1049   BILITOT 0.5  08/14/2018 0758   GFRNONAA >60 07/22/2020 0855   GFRNONAA >60 12/19/2018 1434   GFRAA 81 02/03/2020 1049   GFRAA >60 12/19/2018 1434    No results found for: TOTALPROTELP, ALBUMINELP, A1GS, A2GS, BETS, BETA2SER, GAMS, MSPIKE, SPEI  No results found for: KPAFRELGTCHN, LAMBDASER, KAPLAMBRATIO  Lab Results  Component Value Date   WBC 5.7 07/22/2020   NEUTROABS 4.2 07/22/2020   HGB 11.9 (L) 07/22/2020   HCT 37.7 07/22/2020   MCV 88.9 07/22/2020   PLT 248 07/22/2020   No results found for: LABCA2  No components found for: VVOHYW737  No results for input(s): INR in the last 168 hours.  No results found for: LABCA2  No results found for: TGG269  No results found for: SWN462  No results found for: VOJ500  No results found for: CA2729  No components found for: HGQUANT  No results found for: CEA1 / No results found for: CEA1   No results found for: AFPTUMOR  No results found for: CHROMOGRNA  No results found for: HGBA, HGBA2QUANT, HGBFQUANT, HGBSQUAN (Hemoglobinopathy evaluation)   No results found for: LDH  No results found for: IRON, TIBC, IRONPCTSAT (Iron and TIBC)  No results found for: FERRITIN  Urinalysis    Component Value Date/Time   COLORURINE YELLOW 12/19/2018 1355   APPEARANCEUR Clear 02/03/2020 1045   LABSPEC 1.016 12/19/2018 1355   PHURINE 5.0 12/19/2018 1355   GLUCOSEU Negative 02/03/2020 [place] 12/19/2018 1355   BILIRUBINUR Negative 02/03/2020 [place] 12/19/2018 1355   PROTEINUR Negative 02/03/2020 [place] 12/19/2018 1355   UROBILINOGEN negative 11/03/2014 1039   UROBILINOGEN 0.2 05/25/2011 9381  NITRITE Negative 02/03/2020 1045   NITRITE NEGATIVE 12/19/2018 1355   LEUKOCYTESUR Negative 02/03/2020 1045   LEUKOCYTESUR SMALL (A) 12/19/2018 1355    STUDIES: No results found.   ELIGIBLE FOR AVAILABLE RESEARCH PROTOCOL: no   ASSESSMENT: 70 y.o. [place], [place] woman status post right  breast biopsy 08/02/2018 for a clinical T3 N0, stage IIA invasive lobular carcinoma, grade 2, estrogen receptor strongly positive, progesterone receptor 1% positive, with no HER-2 amplification and an MIB-1 of 1%.  (a) CT scan of the head and chest, without contrast 08/23/2018 showed nonspecific 0.4 cm left lower lobe pulmonary nodule, no definitive metastatic disease  (b) bone scan 08/23/2018 shows multiple spinal areas of abnormal uptake, but  (c) total spinal MRI 09/03/2018 finds bone scan findings to be secondary to degenerative disease, no evidence of metastatic disease.  (1) status post right mastectomy and sentinel lymph node sampling 09/16/2018 showing a pT3 pN1(mic), stage IIA-IIIA invasive lobular breast cancer, grade 2, with 1 of 5 sampled lymph nodes involved by micrometastatic deposit, with extra nodular extension; ample margins  (2)  MammaPrint "high risk" suggests a 5-year metastasis free survival of 93% with chemotherapy, with an absolute chemotherapy benefit in the greater than 12% range  (3) adjuvant radiation 12/31/2018-02/19/2019:  The right chest wall and regional nodes were treated to 50.4 Gy in 28 fractions followed by a 10 Gy boost over 5 fractions.   (4) anastrozole started neoadjuvantly (on 08/14/2018), discontinued 10/14/2018 in preparation for chemotherapy, resumed October 2020  (a) bone density 10/13/2016 showed osteopenia with a lowest score at -2.1  (b) bone density JAN 2021  (c) ibandronate/Boniva started January 2021  (5) [facility] testing on mastectomy sample (09/16/2018) showed stable MSI and proficient mismatch repair status, with a low mutational burden; BRCA 1 and 2 were not mutated, PI K3 was not mutated, ER B B2 was not mutated, and AKT 1 was not mutated.  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  This is very favorable.  She is tolerating anastrozole well and the plan is to continue that a total of 5 years  She is also on ibandronate for osteoporosis treatment.  We are going to obtain mammography on the left and a bone density at the [facility] April of this year (she is behind on both) and I will see her shortly after that.  I commended the fact that she is self aware, knows that she needs help, and knows to receive help.  I am very hopeful that by the time I see her again in April her mood will have improved  Total encounter time 25 minutes.Sarajane Jews C. , MD  07/22/20 10:03 AM Medical Oncology and Hematology [facility] [place], [place] [address] Tel. [phone]    Fax. [phone]   I, Wilburn Mylar, am acting as scribe for Dr. Virgie Dad. .  I, Lurline Del MD, have reviewed the above documentation for accuracy and completeness, and I agree with the above.   *Total Encounter Time as defined by the Centers for  Medicare and Medicaid Services includes, in addition to the face-to-face time of a patient visit (documented in the note above) non-face-to-face time: obtaining and reviewing outside history, ordering and reviewing medications, tests or procedures, care coordination (communications with other health care professionals or caregivers) and documentation in the medical record. 

## 2020-07-22 ENCOUNTER — Other Ambulatory Visit: Payer: Self-pay

## 2020-07-22 ENCOUNTER — Inpatient Hospital Stay: Payer: BC Managed Care – PPO

## 2020-07-22 ENCOUNTER — Inpatient Hospital Stay: Payer: BC Managed Care – PPO | Attending: Nurse Practitioner | Admitting: Oncology

## 2020-07-22 VITALS — BP 129/51 | HR 62 | Temp 97.7°F | Resp 18 | Ht 64.0 in | Wt 231.1 lb

## 2020-07-22 DIAGNOSIS — Z79899 Other long term (current) drug therapy: Secondary | ICD-10-CM | POA: Insufficient documentation

## 2020-07-22 DIAGNOSIS — Z9011 Acquired absence of right breast and nipple: Secondary | ICD-10-CM | POA: Insufficient documentation

## 2020-07-22 DIAGNOSIS — K219 Gastro-esophageal reflux disease without esophagitis: Secondary | ICD-10-CM | POA: Diagnosis not present

## 2020-07-22 DIAGNOSIS — I4891 Unspecified atrial fibrillation: Secondary | ICD-10-CM | POA: Diagnosis not present

## 2020-07-22 DIAGNOSIS — M5137 Other intervertebral disc degeneration, lumbosacral region: Secondary | ICD-10-CM

## 2020-07-22 DIAGNOSIS — Z7901 Long term (current) use of anticoagulants: Secondary | ICD-10-CM | POA: Insufficient documentation

## 2020-07-22 DIAGNOSIS — C50911 Malignant neoplasm of unspecified site of right female breast: Secondary | ICD-10-CM | POA: Insufficient documentation

## 2020-07-22 DIAGNOSIS — Z923 Personal history of irradiation: Secondary | ICD-10-CM | POA: Insufficient documentation

## 2020-07-22 DIAGNOSIS — Z170 Estrogen receptor positive status [ER+]: Secondary | ICD-10-CM

## 2020-07-22 DIAGNOSIS — I480 Paroxysmal atrial fibrillation: Secondary | ICD-10-CM | POA: Insufficient documentation

## 2020-07-22 DIAGNOSIS — R002 Palpitations: Secondary | ICD-10-CM

## 2020-07-22 DIAGNOSIS — C50411 Malignant neoplasm of upper-outer quadrant of right female breast: Secondary | ICD-10-CM

## 2020-07-22 DIAGNOSIS — F4321 Adjustment disorder with depressed mood: Secondary | ICD-10-CM

## 2020-07-22 DIAGNOSIS — K589 Irritable bowel syndrome without diarrhea: Secondary | ICD-10-CM

## 2020-07-22 DIAGNOSIS — M810 Age-related osteoporosis without current pathological fracture: Secondary | ICD-10-CM | POA: Diagnosis not present

## 2020-07-22 DIAGNOSIS — F319 Bipolar disorder, unspecified: Secondary | ICD-10-CM | POA: Diagnosis not present

## 2020-07-22 DIAGNOSIS — M51379 Other intervertebral disc degeneration, lumbosacral region without mention of lumbar back pain or lower extremity pain: Secondary | ICD-10-CM

## 2020-07-22 DIAGNOSIS — F419 Anxiety disorder, unspecified: Secondary | ICD-10-CM | POA: Diagnosis not present

## 2020-07-22 DIAGNOSIS — Z9221 Personal history of antineoplastic chemotherapy: Secondary | ICD-10-CM | POA: Diagnosis not present

## 2020-07-22 DIAGNOSIS — Z79811 Long term (current) use of aromatase inhibitors: Secondary | ICD-10-CM | POA: Diagnosis not present

## 2020-07-22 LAB — COMPREHENSIVE METABOLIC PANEL
ALT: 8 U/L (ref 0–44)
AST: 13 U/L — ABNORMAL LOW (ref 15–41)
Albumin: 3.4 g/dL — ABNORMAL LOW (ref 3.5–5.0)
Alkaline Phosphatase: 94 U/L (ref 38–126)
Anion gap: 7 (ref 5–15)
BUN: 14 mg/dL (ref 8–23)
CO2: 27 mmol/L (ref 22–32)
Calcium: 9 mg/dL (ref 8.9–10.3)
Chloride: 108 mmol/L (ref 98–111)
Creatinine, Ser: 0.91 mg/dL (ref 0.44–1.00)
GFR, Estimated: >60 mL/min (ref >60)
Glucose, Bld: 124 mg/dL — ABNORMAL HIGH (ref 70–99)
Potassium: 4.3 mmol/L (ref 3.5–5.1)
Sodium: 142 mmol/L (ref 135–145)
Total Bilirubin: 0.4 mg/dL (ref 0.3–1.2)
Total Protein: 6.9 g/dL (ref 6.5–8.1)

## 2020-07-22 LAB — CBC WITH DIFFERENTIAL/PLATELET
Abs Immature Granulocytes: 0.02 10*3/uL (ref 0.00–0.07)
Basophils Absolute: 0.1 10*3/uL (ref 0.0–0.1)
Basophils Relative: 1 %
Eosinophils Absolute: 0.2 10*3/uL (ref 0.0–0.5)
Eosinophils Relative: 3 %
HCT: 37.7 % (ref 36.0–46.0)
Hemoglobin: 11.9 g/dL — ABNORMAL LOW (ref 12.0–15.0)
Immature Granulocytes: 0 %
Lymphocytes Relative: 14 %
Lymphs Abs: 0.8 10*3/uL (ref 0.7–4.0)
MCH: 28.1 pg (ref 26.0–34.0)
MCHC: 31.6 g/dL (ref 30.0–36.0)
MCV: 88.9 fL (ref 80.0–100.0)
Monocytes Absolute: 0.5 10*3/uL (ref 0.1–1.0)
Monocytes Relative: 9 %
Neutro Abs: 4.2 10*3/uL (ref 1.7–7.7)
Neutrophils Relative %: 73 %
Platelets: 248 10*3/uL (ref 150–400)
RBC: 4.24 MIL/uL (ref 3.87–5.11)
RDW: 15 % (ref 11.5–15.5)
WBC: 5.7 10*3/uL (ref 4.0–10.5)
nRBC: 0 % (ref 0.0–0.2)

## 2020-07-22 MED ORDER — IBANDRONATE SODIUM 150 MG PO TABS: 150.0000 mg | ORAL_TABLET | ORAL | 4 refills | Status: DC

## 2020-07-22 MED ORDER — ANASTROZOLE 1 MG PO TABS: 1.0000 mg | ORAL_TABLET | Freq: Every day | ORAL | 4 refills | Status: DC

## 2020-07-26 ENCOUNTER — Telehealth: Payer: Self-pay | Admitting: Oncology

## 2020-07-26 NOTE — Telephone Encounter (Signed)
Scheduled appts per 1/13 los. Pt confirmed appt date and time.  

## 2020-07-28 ENCOUNTER — Other Ambulatory Visit: Payer: Self-pay | Admitting: Nurse Practitioner

## 2020-08-10 DIAGNOSIS — F312 Bipolar disorder, current episode manic severe with psychotic features: Secondary | ICD-10-CM | POA: Diagnosis not present

## 2020-08-10 DIAGNOSIS — F4310 Post-traumatic stress disorder, unspecified: Secondary | ICD-10-CM | POA: Diagnosis not present

## 2020-08-10 DIAGNOSIS — F410 Panic disorder [episodic paroxysmal anxiety] without agoraphobia: Secondary | ICD-10-CM | POA: Diagnosis not present

## 2020-08-17 DIAGNOSIS — F410 Panic disorder [episodic paroxysmal anxiety] without agoraphobia: Secondary | ICD-10-CM | POA: Diagnosis not present

## 2020-08-17 DIAGNOSIS — F4310 Post-traumatic stress disorder, unspecified: Secondary | ICD-10-CM | POA: Diagnosis not present

## 2020-08-17 DIAGNOSIS — F312 Bipolar disorder, current episode manic severe with psychotic features: Secondary | ICD-10-CM | POA: Diagnosis not present

## 2020-08-18 ENCOUNTER — Ambulatory Visit (INDEPENDENT_AMBULATORY_CARE_PROVIDER_SITE_OTHER): Payer: BC Managed Care – PPO | Admitting: Nurse Practitioner

## 2020-08-18 DIAGNOSIS — R195 Other fecal abnormalities: Secondary | ICD-10-CM | POA: Diagnosis not present

## 2020-08-18 NOTE — Assessment & Plan Note (Signed)
Occult blood ordered to evaluate new symptoms of dark stools.  Patient is not reporting any nausea vomiting dizziness.  Results pending we will treat appropriately.

## 2020-08-18 NOTE — Progress Notes (Signed)
    Virtual Visit via telephone Note Due to COVID-19 pandemic this visit was conducted virtually. This visit type was conducted due to national recommendations for restrictions regarding the COVID-19 Pandemic (e.g. social distancing, sheltering in place) in an effort to limit this patient's exposure and mitigate transmission in our community. All issues noted in this document were discussed and addressed.  A physical exam was not performed with this format.  I connected with Doris Smith on 08/18/20 at  2:35 PM by telephone and verified that I am speaking with the correct person using two identifiers. Doris Smith is currently located at home during visit. The provider, Ivy Lynn, NP is located in their office at time of visit.  I discussed the limitations, risks, security and privacy concerns of performing an evaluation and management service by telephone and the availability of in person appointments. I also discussed with the patient that there may be a patient responsible charge related to this service. The patient expressed understanding and agreed to proceed.   History and Present Illness:  HPI   Patient is being evaluated for dark stools, this is new for patient in the last few days.  Patient is reporting worsening fatigue, no nausea, or dizziness.  Patient is not currently on any iron supplement.     Review of Systems  Constitutional: Negative for chills, fever and weight loss.  HENT: Negative.   Respiratory: Negative.   Cardiovascular: Negative.   Gastrointestinal: Negative.   Genitourinary: Negative.   Musculoskeletal: Negative.   Skin: Negative.   All other systems reviewed and are negative.    Observations/Objective: Televisit  Assessment and Plan:  Dark stools Occult blood ordered to evaluate new symptoms of dark stools.  Patient is not reporting any nausea vomiting dizziness.  Results pending we will treat appropriately.    Follow Up  Instructions: Follow-up with worsening or unresolved symptoms.    I discussed the assessment and treatment plan with the patient. The patient was provided an opportunity to ask questions and all were answered. The patient agreed with the plan and demonstrated an understanding of the instructions.   The patient was advised to call back or seek an in-person evaluation if the symptoms worsen or if the condition fails to improve as anticipated.  The above assessment and management plan was discussed with the patient. The patient verbalized understanding of and has agreed to the management plan. Patient is aware to call the clinic if symptoms persist or worsen. Patient is aware when to return to the clinic for a follow-up visit. Patient educated on when it is appropriate to go to the emergency department.   Time call ended: 2:43 PM  I provided 8 minutes of non-face-to-face time during this encounter.    Ivy Lynn, NP

## 2020-08-19 LAB — CBC WITH DIFFERENTIAL/PLATELET
Basophils Absolute: 0 10*3/uL (ref 0.0–0.2)
Basos: 1 %
EOS (ABSOLUTE): 0.1 10*3/uL (ref 0.0–0.4)
Eos: 2 %
Hematocrit: 36.2 % (ref 34.0–46.6)
Hemoglobin: 11.8 g/dL (ref 11.1–15.9)
Immature Grans (Abs): 0 10*3/uL (ref 0.0–0.1)
Immature Granulocytes: 0 %
Lymphocytes Absolute: 0.9 10*3/uL (ref 0.7–3.1)
Lymphs: 16 %
MCH: 28 pg (ref 26.6–33.0)
MCHC: 32.6 g/dL (ref 31.5–35.7)
MCV: 86 fL (ref 79–97)
Monocytes Absolute: 0.7 10*3/uL (ref 0.1–0.9)
Monocytes: 13 %
Neutrophils Absolute: 3.8 10*3/uL (ref 1.4–7.0)
Neutrophils: 68 %
Platelets: 133 10*3/uL — ABNORMAL LOW (ref 150–450)
RBC: 4.22 x10E6/uL (ref 3.77–5.28)
RDW: 14.3 % (ref 11.7–15.4)
WBC: 5.6 10*3/uL (ref 3.4–10.8)

## 2020-08-19 LAB — FECAL OCCULT BLOOD, IMMUNOCHEMICAL: Fecal Occult Bld: NEGATIVE

## 2020-08-22 ENCOUNTER — Other Ambulatory Visit: Payer: Self-pay | Admitting: Nurse Practitioner

## 2020-08-22 DIAGNOSIS — R195 Other fecal abnormalities: Secondary | ICD-10-CM

## 2020-08-25 DIAGNOSIS — F4310 Post-traumatic stress disorder, unspecified: Secondary | ICD-10-CM | POA: Diagnosis not present

## 2020-08-25 DIAGNOSIS — F312 Bipolar disorder, current episode manic severe with psychotic features: Secondary | ICD-10-CM | POA: Diagnosis not present

## 2020-08-25 DIAGNOSIS — F410 Panic disorder [episodic paroxysmal anxiety] without agoraphobia: Secondary | ICD-10-CM | POA: Diagnosis not present

## 2020-09-07 DIAGNOSIS — F410 Panic disorder [episodic paroxysmal anxiety] without agoraphobia: Secondary | ICD-10-CM | POA: Diagnosis not present

## 2020-09-07 DIAGNOSIS — F312 Bipolar disorder, current episode manic severe with psychotic features: Secondary | ICD-10-CM | POA: Diagnosis not present

## 2020-09-07 DIAGNOSIS — F4310 Post-traumatic stress disorder, unspecified: Secondary | ICD-10-CM | POA: Diagnosis not present

## 2020-09-14 DIAGNOSIS — F312 Bipolar disorder, current episode manic severe with psychotic features: Secondary | ICD-10-CM | POA: Diagnosis not present

## 2020-09-14 DIAGNOSIS — F4310 Post-traumatic stress disorder, unspecified: Secondary | ICD-10-CM | POA: Diagnosis not present

## 2020-09-14 DIAGNOSIS — F410 Panic disorder [episodic paroxysmal anxiety] without agoraphobia: Secondary | ICD-10-CM | POA: Diagnosis not present

## 2020-09-27 ENCOUNTER — Telehealth: Payer: Self-pay | Admitting: Oncology

## 2020-09-27 NOTE — Telephone Encounter (Signed)
R/s appts per 3/21 sch msg. Pt aware.

## 2020-10-15 IMAGING — MR MR BILATERAL BREAST WITHOUT AND WITH CONTRAST
6 of 14 series · 19 of 48 positions shown · IV contrast (Yes)
Comparison: Previous exam(s).

CLINICAL DATA: Patient initially presented with right nipple
retraction/inversion and right breast firmness for at least 2
months. On diagnostic imaging, multiple abnormalities in the right
breast noted, with masses biopsied in the 1 o'clock and 9 o'clock
positions, both positive for carcinoma. Recently diagnosed invasive
breast carcinoma, stage IIB, estrogen receptor positive. MRI for
staging/extent of disease.

LABS:  No labs needed at time of imaging.
EXAM:
BILATERAL BREAST MRI WITH AND WITHOUT CONTRAST
TECHNIQUE: Multiplanar, multisequence MR images of both breasts were obtained
prior to and following the intravenous administration of 10 mL ml of
Gadavist

[Series 1: (phone_number) · axial · 7.0mm · 1.56mm/px · 1 of 25 slices shown]
[im 1/25]
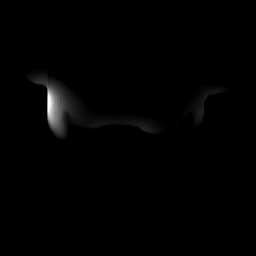

[Series 4: ax ir · axial · 3.0mm · 0.62mm/px · 1 of 64 slices shown]
[im 1/64]
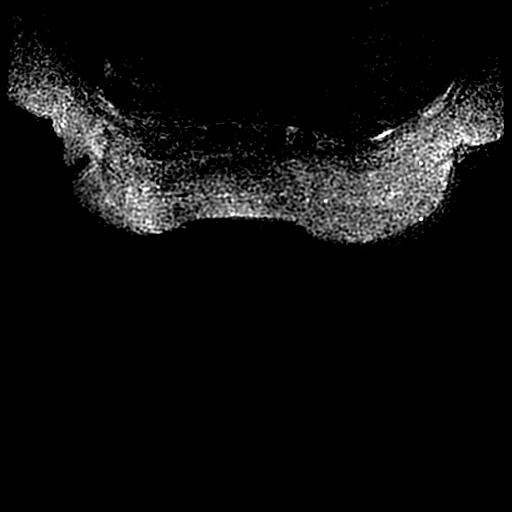

[Series 600: 10ml vibrant mph · axial · 1.8mm · 0.62mm/px · z∈[-106,+84]mm · 5 of 212 slices shown]
[im 1/212]
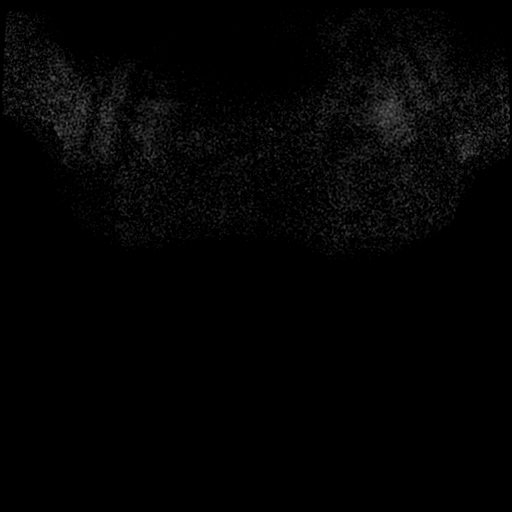
[im 53/212]
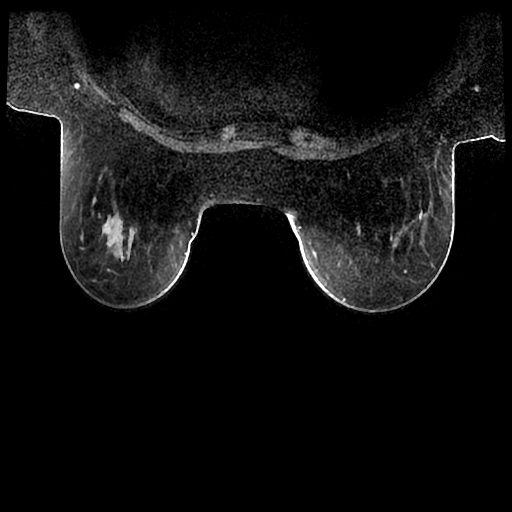
[im 106/212]
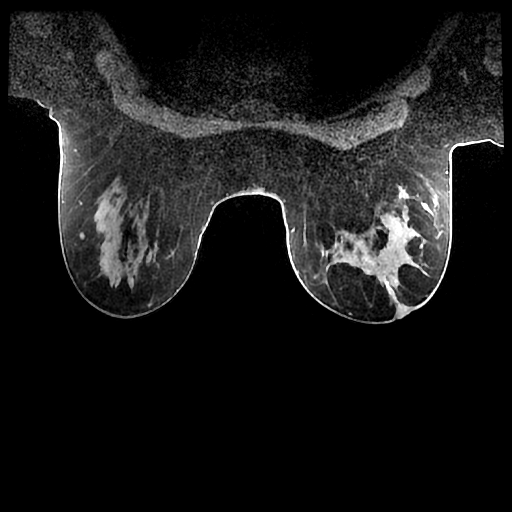
[im 159/212]
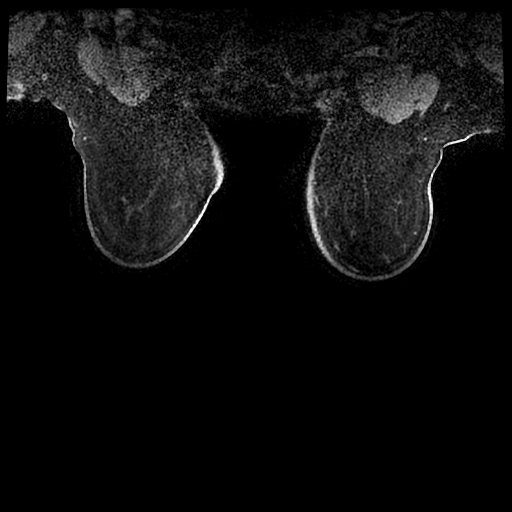
[im 212/212]
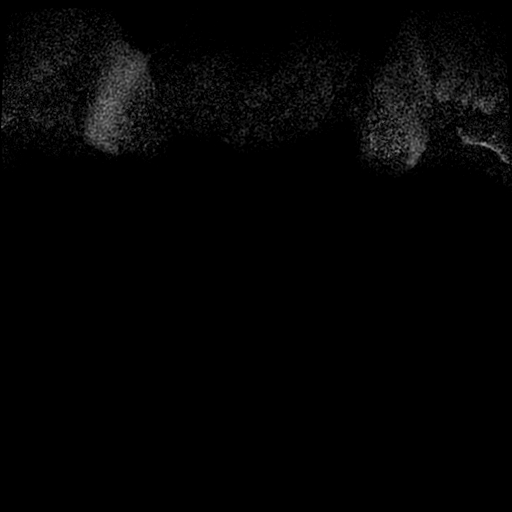

[Series 601: ph1/10ml vibrant mph · axial · 1.8mm · 0.62mm/px · z∈[-106,+84]mm · 5 of 212 slices shown]
[im 1/212]
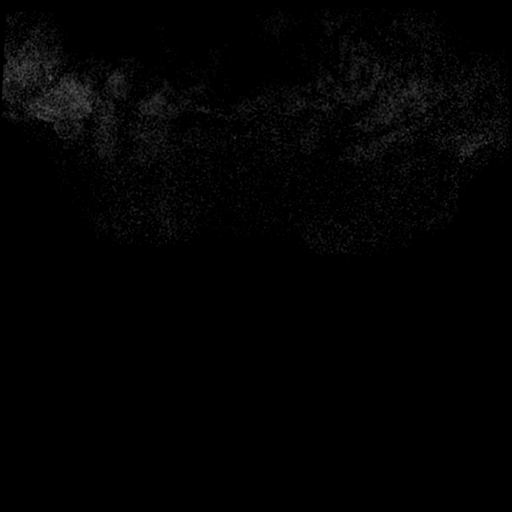
[im 53/212]
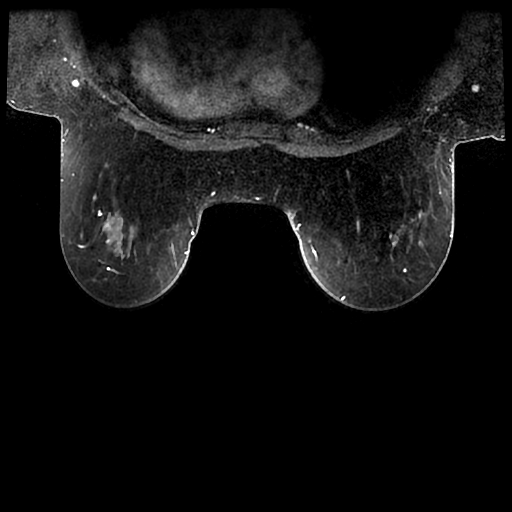
[im 106/212]
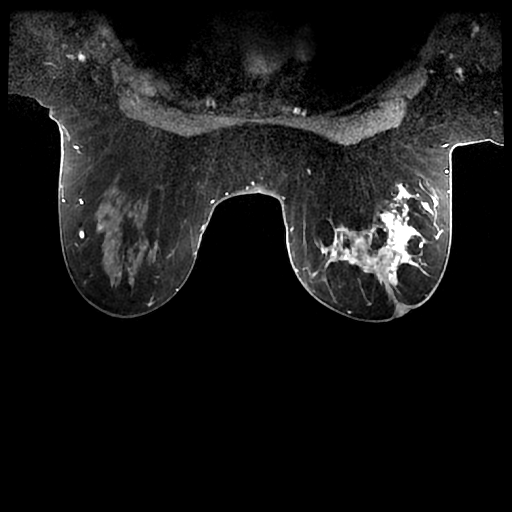
[im 159/212]
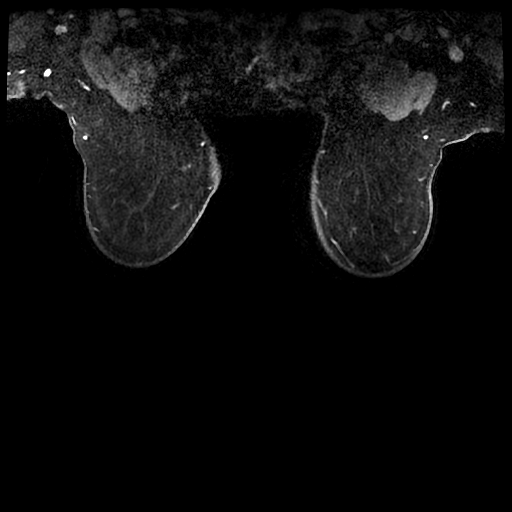
[im 212/212]
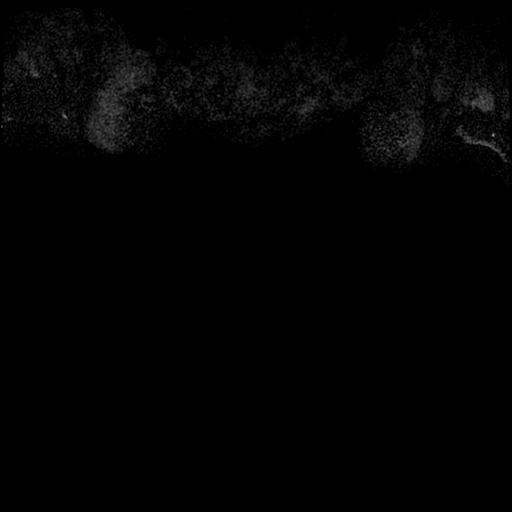

[Series 602: ph2/10ml vibrant mph · axial · 1.8mm · 0.62mm/px · z∈[-106,+84]mm · 6 of 212 slices shown]
[im 1/212]
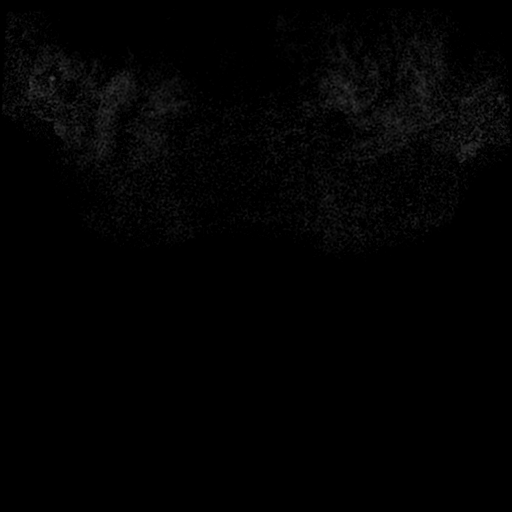
[im 43/212]
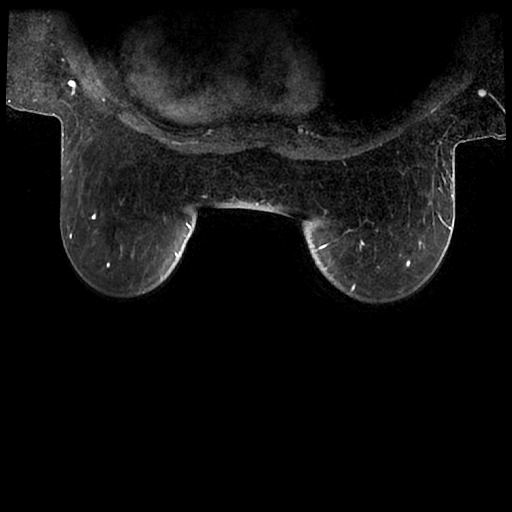
[im 85/212]
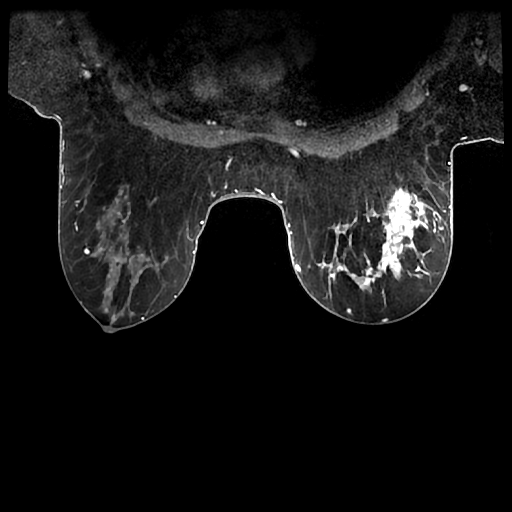
[im 127/212]
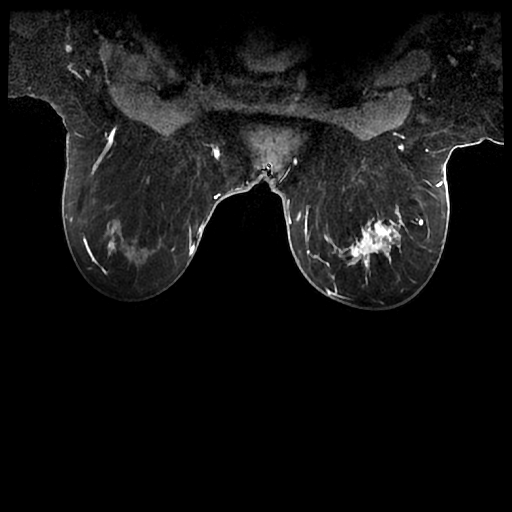
[im 169/212]
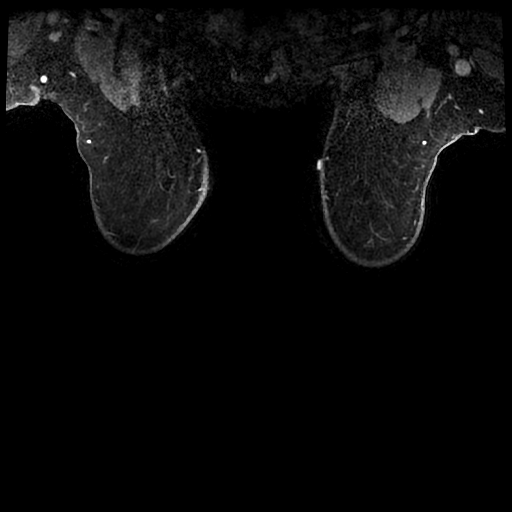
[im 212/212]
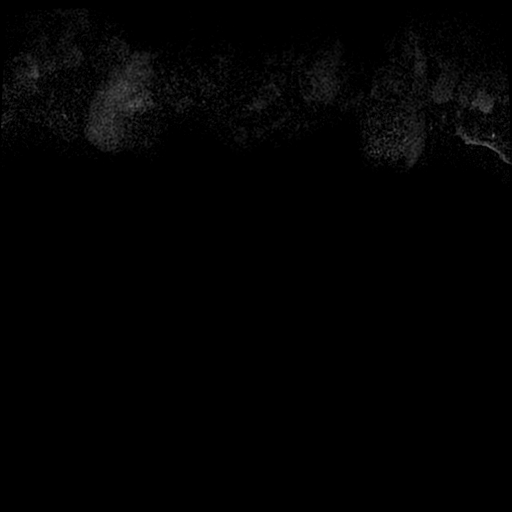

[Series 603: ph3/10ml vibrant mph · axial · 1.8mm · 0.62mm/px · 1 of 212 slices shown]
[im 1/212]
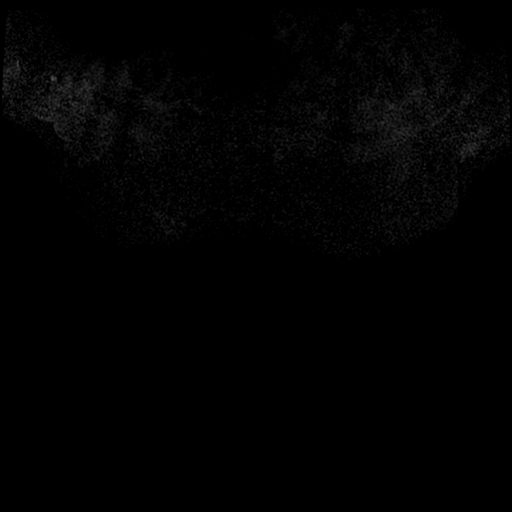

[19 of 48 positions shown; findings below may reference images not displayed]

Three-dimensional MR images were rendered by post-processing of the
original MR data on an independent workstation. The
three-dimensional MR images were interpreted, and findings are
reported in the following complete MRI report for this study. Three
dimensional images were evaluated at the independent [employer]
workstation
FINDINGS: Breast composition: c. Heterogeneous fibroglandular tissue.

Background parenchymal enhancement: Minimal

Right breast: There is extensive abnormal enhancement, predominantly
clumped non mass enhancement, throughout much of the right breast. A
6 mm mass is noted centrally, slightly lateral to midline, at the
nipple level. At the level of the 1 o'clock position biopsy clip,
there is architectural distortion. Abnormal enhancement surrounds
the 9 o'clock, more posterior, biopsy clip. The overall extent of
the abnormal enhancement extends 6.8 cm, anterior to posterior,
cm right to left and 8.6 cm from superior to inferior.

Left breast: No mass or abnormal enhancement.

Lymph nodes: No abnormal appearing lymph nodes.

Ancillary findings:  None.
IMPRESSION: 1. Multicentric right breast carcinoma. There is extensive abnormal
enhancement, predominantly non mass enhancement, throughout much of
the right breast spanning 6.8 x 6.5 x 8.6 cm, corresponding to the
abnormal density and distortion noted right breast on diagnostic
mammography. The biopsy clips from the recent positive
ultrasound-guided core needle biopsies lie in the anterior upper
inner quadrant of the right breast at 1 o'clock and the posterior
lower outer quadrant, described as 9 o'clock, but appearing below
the nipple line on MRI. The extensive abnormal enhancement extends,
uninterrupted, between the 2 biopsy clips.
2. No abnormal enlarged axillary lymph nodes.
3. No evidence of left breast malignancy.

RECOMMENDATION:
1. Treatment as planned the extensive right breast carcinoma.

BI-RADS CATEGORY  6: Known biopsy-proven malignancy.

## 2020-10-18 DIAGNOSIS — F4310 Post-traumatic stress disorder, unspecified: Secondary | ICD-10-CM | POA: Diagnosis not present

## 2020-10-18 DIAGNOSIS — F312 Bipolar disorder, current episode manic severe with psychotic features: Secondary | ICD-10-CM | POA: Diagnosis not present

## 2020-10-18 DIAGNOSIS — F410 Panic disorder [episodic paroxysmal anxiety] without agoraphobia: Secondary | ICD-10-CM | POA: Diagnosis not present

## 2020-10-19 DIAGNOSIS — F312 Bipolar disorder, current episode manic severe with psychotic features: Secondary | ICD-10-CM | POA: Diagnosis not present

## 2020-10-19 DIAGNOSIS — F410 Panic disorder [episodic paroxysmal anxiety] without agoraphobia: Secondary | ICD-10-CM | POA: Diagnosis not present

## 2020-10-19 DIAGNOSIS — F4310 Post-traumatic stress disorder, unspecified: Secondary | ICD-10-CM | POA: Diagnosis not present

## 2020-10-21 ENCOUNTER — Telehealth: Payer: Self-pay | Admitting: *Deleted

## 2020-10-21 DIAGNOSIS — F418 Other specified anxiety disorders: Secondary | ICD-10-CM

## 2020-10-21 DIAGNOSIS — F3160 Bipolar disorder, current episode mixed, unspecified: Secondary | ICD-10-CM

## 2020-10-21 NOTE — Telephone Encounter (Signed)
Viibryd 40MG  tablets PA started (Key: [id number])  This request has been approved using information available on the patient's profile. [id number]:[id number];Review Type:Prior Auth;Coverage Start Date:09/21/2020;Coverage End Date:10/21/2021;  Pharm notified

## 2020-10-29 ENCOUNTER — Inpatient Hospital Stay: Admission: RE | Admit: 2020-10-29 | Payer: BC Managed Care – PPO | Source: Ambulatory Visit

## 2020-11-01 ENCOUNTER — Other Ambulatory Visit: Payer: BC Managed Care – PPO

## 2020-11-01 ENCOUNTER — Ambulatory Visit: Payer: BC Managed Care – PPO | Admitting: Oncology

## 2020-11-11 ENCOUNTER — Other Ambulatory Visit: Payer: Self-pay

## 2020-11-11 ENCOUNTER — Encounter: Payer: Self-pay | Admitting: Nurse Practitioner

## 2020-11-11 ENCOUNTER — Ambulatory Visit: Payer: BC Managed Care – PPO | Admitting: Nurse Practitioner

## 2020-11-11 ENCOUNTER — Other Ambulatory Visit (INDEPENDENT_AMBULATORY_CARE_PROVIDER_SITE_OTHER): Payer: BC Managed Care – PPO

## 2020-11-11 VITALS — BP 141/75 | HR 63 | Temp 98.6°F | Resp 20 | Ht 64.0 in | Wt 226.0 lb

## 2020-11-11 DIAGNOSIS — F5102 Adjustment insomnia: Secondary | ICD-10-CM | POA: Diagnosis not present

## 2020-11-11 DIAGNOSIS — F319 Bipolar disorder, unspecified: Secondary | ICD-10-CM | POA: Diagnosis not present

## 2020-11-11 DIAGNOSIS — K582 Mixed irritable bowel syndrome: Secondary | ICD-10-CM | POA: Diagnosis not present

## 2020-11-11 DIAGNOSIS — F418 Other specified anxiety disorders: Secondary | ICD-10-CM | POA: Diagnosis not present

## 2020-11-11 DIAGNOSIS — K219 Gastro-esophageal reflux disease without esophagitis: Secondary | ICD-10-CM | POA: Diagnosis not present

## 2020-11-11 DIAGNOSIS — I480 Paroxysmal atrial fibrillation: Secondary | ICD-10-CM | POA: Diagnosis not present

## 2020-11-11 DIAGNOSIS — I10 Essential (primary) hypertension: Secondary | ICD-10-CM

## 2020-11-11 DIAGNOSIS — Z780 Asymptomatic menopausal state: Secondary | ICD-10-CM

## 2020-11-11 DIAGNOSIS — E669 Obesity, unspecified: Secondary | ICD-10-CM | POA: Diagnosis not present

## 2020-11-11 LAB — CMP14+EGFR
ALT: 7 IU/L (ref 0–32)
AST: 9 IU/L (ref 0–40)
Albumin/Globulin Ratio: 1.6 (ref 1.2–2.2)
Albumin: 3.8 g/dL (ref 3.8–4.8)
Alkaline Phosphatase: 129 IU/L — ABNORMAL HIGH (ref 44–121)
BUN/Creatinine Ratio: 10 — ABNORMAL LOW (ref 12–28)
BUN: 8 mg/dL (ref 8–27)
Bilirubin Total: 0.4 mg/dL (ref 0.0–1.2)
CO2: 25 mmol/L (ref 20–29)
Calcium: 9 mg/dL (ref 8.7–10.3)
Chloride: 104 mmol/L (ref 96–106)
Creatinine, Ser: 0.79 mg/dL (ref 0.57–1.00)
Globulin, Total: 2.4 g/dL (ref 1.5–4.5)
Glucose: 87 mg/dL (ref 65–99)
Potassium: 3.9 mmol/L (ref 3.5–5.2)
Sodium: 142 mmol/L (ref 134–144)
Total Protein: 6.2 g/dL (ref 6.0–8.5)
eGFR: 82 mL/min/{1.73_m2} (ref >59)

## 2020-11-11 LAB — CBC WITH DIFFERENTIAL/PLATELET
Basophils Absolute: 0.1 10*3/uL (ref 0.0–0.2)
Basos: 1 %
EOS (ABSOLUTE): 0.1 10*3/uL (ref 0.0–0.4)
Eos: 2 %
Hematocrit: 35 % (ref 34.0–46.6)
Hemoglobin: 11.5 g/dL (ref 11.1–15.9)
Immature Grans (Abs): 0 10*3/uL (ref 0.0–0.1)
Immature Granulocytes: 1 %
Lymphocytes Absolute: 0.8 10*3/uL (ref 0.7–3.1)
Lymphs: 13 %
MCH: 28 pg (ref 26.6–33.0)
MCHC: 32.9 g/dL (ref 31.5–35.7)
MCV: 85 fL (ref 79–97)
Monocytes Absolute: 0.6 10*3/uL (ref 0.1–0.9)
Monocytes: 11 %
Neutrophils Absolute: 4.5 10*3/uL (ref 1.4–7.0)
Neutrophils: 72 %
Platelets: 186 10*3/uL (ref 150–450)
RBC: 4.11 x10E6/uL (ref 3.77–5.28)
RDW: 14.3 % (ref 11.7–15.4)
WBC: 6.1 10*3/uL (ref 3.4–10.8)

## 2020-11-11 LAB — LIPID PANEL
Chol/HDL Ratio: 2.6 ratio (ref 0.0–4.4)
Cholesterol, Total: 194 mg/dL (ref 100–199)
HDL: 76 mg/dL (ref >39)
LDL Chol Calc (NIH): 92 mg/dL (ref 0–99)
Triglycerides: 154 mg/dL — ABNORMAL HIGH (ref 0–149)
VLDL Cholesterol Cal: 26 mg/dL (ref 5–40)

## 2020-11-11 MED ORDER — ESOMEPRAZOLE MAGNESIUM 40 MG PO CPDR: 40.0000 mg | DELAYED_RELEASE_CAPSULE | Freq: Every day | ORAL | 1 refills | Status: DC

## 2020-11-11 NOTE — Patient Instructions (Signed)
Complicated Grief Grief is a normal response to the death of someone close to you. Feelings of fear, anger, and guilt can affect almost everyone who loses a loved one. It is also common to have symptoms of depression while you are grieving. These include problems with sleep, loss of appetite, and lack of energy. They may last for weeks or months after a loss. Complicated grief is different from normal grief or depression. Normal grieving involves sadness and feelings of loss, but those feelings get better and heal over time. Complicated grief is a severe type of grief that lasts for a long time, usually for several months to a year or longer. It interferes with your ability to function normally. Complicated grief may require treatment from a mental health care provider. What are the causes? The cause of this condition is not known. It is not clear why some people continue to struggle with grief and others do not. What increases the risk? You are more likely to develop this condition if:  The death of your loved one was sudden or unexpected.  The death of your loved one was due to a violent event.  Your loved one died from suicide.  Your loved one was a child or a young person.  You were very close to your loved one, or you were dependent on him or her.  You have a history of depression or anxiety. What are the signs or symptoms? Symptoms of this condition include:  Feeling disbelief or having a lack of emotion (numbness).  Being unable to enjoy good memories of your loved one.  Needing to avoid anything or anyone that reminds you of your loved one.  Being unable to stop thinking about the death.  Feeling intense anger or guilt.  Feeling alone and hopeless.  Feeling that your life is meaningless and empty.  Losing the desire to move on with your life. How is this diagnosed? This condition may be diagnosed based on:  Your symptoms. Complicated grief will be diagnosed if you have  ongoing symptoms of grief for 6-12 months or longer.  The effect of symptoms on your life. You may be diagnosed with this condition if your symptoms are interfering with your ability to live your life. Your health care provider may recommend that you see a mental health care provider. Many symptoms of depression are similar to the symptoms of complicated grief. It is important to be evaluated for complicated grief along with other mental health conditions. How is this treated? This condition is most commonly treated with talk therapy. This therapy is offered by a mental health specialist (psychiatrist). During therapy:  You will learn healthy ways to cope with the loss of your loved one.  Your mental health care provider may recommend antidepressant medicines.   Follow these instructions at home: Lifestyle  Take care of yourself. ? Eat on a regular basis, and maintain a healthy diet. Eat plenty of fruits, vegetables, lean protein, and whole grains. ? Try to get some exercise each day. Aim for 30 minutes of exercise on most days of the week. ? Keep a consistent sleep schedule. Try to get 8 or more hours of sleep each night. ? Start doing the things that you used to enjoy.  Do not use drugs or alcohol to ease your symptoms.  Spend time with friends and loved ones.   General instructions  Take over-the-counter and prescription medicines only as told by your health care provider.  Consider joining a grief (  bereavement) support group to help you deal with your loss.  Keep all follow-up visits as told by your health care provider. This is important. Contact a health care provider if:  Your symptoms prevent you from functioning normally.  Your symptoms do not get better with treatment. Get help right away if:  You have serious thoughts about hurting yourself or someone else.  You have suicidal feelings. If you ever feel like you may hurt yourself or others, or have thoughts about  taking your own life, get help right away. You can go to your nearest emergency department or call:  Your local emergency services (911 in the [place]  A suicide crisis helpline, such as the National Suicide Prevention Lifeline at 1-800-273-8255. This is open 24 hours a day. Summary  Complicated grief is a severe type of grief that lasts for a long time. This grief is not likely to go away on its own. Get the help you need.  Some griefs are more difficult than others and can cause this condition. You may need a certain type of treatment to help you recover if the loss of your loved one was sudden, violent, or due to suicide.  You may feel guilty about moving on with your life. Getting help does not mean that you are forgetting your loved one. It means that you are taking care of yourself.  Complicated grief is best treated with talk therapy. Medicines may also be prescribed.  Seek the help you need, and find support that will help you recover. This information is not intended to replace advice given to you by your health care provider. Make sure you discuss any questions you have with your health care provider. Document Revised: 12/18/2019 Document Reviewed: 12/18/2019 Elsevier Patient Education  2021 [employer] Inc.  

## 2020-11-11 NOTE — Progress Notes (Signed)
Subjective:    Patient ID: Doris Smith, female    DOB: 04-Jul-1951, 70 y.o.   MRN: [id number]   Chief Complaint: Medical Management of Chronic Issues    HPI:  1. Essential hypertension No c/o chest pain, sob or headache. Does not check blood pressure at home. BP Readings from Last 3 Encounters:  07/22/20 (!) 129/51  06/24/20 112/61  06/15/20 (!) 144/63     2. Paroxysmal atrial fibrillation (HCC) Denies any palpitations or heart racing. Is on eliquis without any bleeding.  3. Gastroesophageal reflux disease without esophagitis Is on nexium daily and is doing well.  4. Irritable bowel syndrome with both constipation and diarrhea Alternates between constipation and diarrhea.  5. Anxiety associated with depression She is on viibryd dal and that helps some. GAD 7 : Generalized Anxiety Score 11/11/2020 05/13/2020 10/24/2019 05/15/2019  Nervous, Anxious, on Edge '3 3 3 3  ' Control/stop worrying '3 1 1 2  ' Worry too much - different things '3 1 1 2  ' Trouble relaxing '3 3 1 3  ' Restless '2 2 1 2  ' Easily annoyed or irritable '2 2 2 3  ' Afraid - awful might happen 0 0 0 0  Total GAD 7 Score '16 12 9 15  ' Anxiety Difficulty Not difficult at all Somewhat difficult Not difficult at all Somewhat difficult     6. Bipolar affective disorder, remission status unspecified ([place]) Is on lamictal and seroquel. Has been dong well lately with no side effects. She is having a hard time since her mom passed away. She has been getting counseling but has had to miss several appointmnets.  7. Insomnia due to stress Only sleeps about 3-4 hours a night.   8. Obesity (BMI 30-39.9) No recent wight changes Wt Readings from Last 3 Encounters:  11/11/20 226 lb (102.5 kg)  07/22/20 231 lb 1.6 oz (104.8 kg)  06/24/20 230 lb (104.3 kg)   BMI Readings from Last 3 Encounters:  11/11/20 38.79 kg/m  07/22/20 39.67 kg/m  06/24/20 39.48 kg/m       Outpatient Encounter Medications as of 11/11/2020   Medication Sig  . anastrozole (ARIMIDEX) 1 MG tablet Take 1 tablet (1 mg total) by mouth daily.  Marland Kitchen apixaban (ELIQUIS) 5 MG TABS tablet Take 1 tablet (5 mg total) by mouth 2 (two) times daily.  . clotrimazole-betamethasone (LOTRISONE) cream APPLY  CREAM TOPICALLY TWICE DAILY  . esomeprazole (NEXIUM) 40 MG capsule Take 1 capsule by mouth once daily  . gabapentin (NEURONTIN) 300 MG capsule TAKE 1 CAPSULE BY MOUTH THREE TIMES DAILY  . hydrOXYzine (ATARAX/VISTARIL) 10 MG tablet Take 1 tablet (10 mg total) by mouth 3 (three) times daily as needed for itching.  . ibandronate (BONIVA) 150 MG tablet Take 1 tablet (150 mg total) by mouth every 30 (thirty) days. Take in the morning with a full glass of water, on an empty stomach, and do not take anything else by mouth or lie down for the next 30 min.  . lamoTRIgine (LAMICTAL) 100 MG tablet Take 1 tablet (100 mg total) by mouth in the morning, at noon, and at bedtime.  Marland Kitchen loratadine (CLARITIN) 10 MG tablet Take 10 mg by mouth daily.  . midodrine (PROAMATINE) 2.5 MG tablet Take 1 tablet (2.5 mg total) by mouth 3 (three) times daily with meals.  . promethazine-dextromethorphan (PROMETHAZINE-DM) 6.25-15 MG/5ML syrup TAKE 5 MILLILITERS BY MOUTH 4 TIMES DAILY AS NEEDED FOR COUGH  . QUEtiapine (SEROQUEL) 25 MG tablet Take 1 tablet (25 mg total)  by mouth at bedtime.  . triamcinolone (KENALOG) 0.1 % Apply 1 application topically 2 (two) times daily.  . Vilazodone HCl (VIIBRYD) 40 MG TABS Take 1 tablet (40 mg total) by mouth daily.  . metoprolol tartrate (LOPRESSOR) 25 MG tablet Take 1 tablet (25 mg total) by mouth 2 (two) times daily.  . [DISCONTINUED] benzonatate (TESSALON PERLES) 100 MG capsule Take 1 capsule (100 mg total) by mouth 3 (three) times daily as needed for cough.   Facility-Administered Encounter Medications as of 11/11/2020  Medication  . heparin lock flush 100 unit/mL  . sodium chloride flush (NS) 0.9 % injection 10 mL    Past Surgical History:   Procedure Laterality Date  . ABDOMINAL HYSTERECTOMY    . COLONOSCOPY    . DILATION AND CURETTAGE OF UTERUS  1981   abnormal pap  . KNEE ARTHROSCOPY Left 2007   x 2  . LUMBAR LAMINECTOMY/DECOMPRESSION MICRODISCECTOMY  05/31/2011   Procedure: LUMBAR LAMINECTOMY/DECOMPRESSION MICRODISCECTOMY;  Surgeon: Johnn Hai;  Location: [facility];  Service: Orthopedics;  Laterality: Left;  Decompression Lumbar four to five and  Lumbar five to Sacral one on Left  (X-Ray)  . MASTECTOMY W/ SENTINEL NODE BIOPSY Right 09/16/2018   Procedure: RIGHT MASTECTOMY WITH RIGHT AXILLARY SENTINEL LYMPH NODE BIOPSY;  Surgeon: Alphonsa Overall, MD;  Location: [facility];  Service: General;  Laterality: Right;  . PARTIAL HYSTERECTOMY  1982  . PORTACATH PLACEMENT Left 10/31/2018   Procedure: INSERTION PORT-A-CATH WITH ULTRASOUND;  Surgeon: Alphonsa Overall, MD;  Location: [facility];  Service: General;  Laterality: Left;  . TONSILLECTOMY     as child  . TOTAL KNEE ARTHROPLASTY Left 12/18/2005    Family History  Problem Relation Age of Onset  . Anesthesia problems Mother   . Heart disease Mother        CHF, atrial fib  . Heart failure Mother   . Anesthesia problems Sister   . Colon polyps Father   . Heart disease Father        "Fluid around the heart"  . Hypertension Sister   . Breast cancer Maternal Aunt   . Colon cancer Maternal Aunt   . Prostate cancer Neg Hx   . Ovarian cancer Neg Hx     New complaints: None new  Social history: Lives with her husband  Controlled substance contract: n/a    Review of Systems  Constitutional: Negative for diaphoresis.  Eyes: Negative for pain.  Respiratory: Negative for shortness of breath.   Cardiovascular: Negative for chest pain, palpitations and leg swelling.  Gastrointestinal: Negative for abdominal pain.  Endocrine: Negative for polydipsia.  Skin: Negative for rash.  Neurological: Negative for dizziness, weakness and headaches.  Hematological: Does  not bruise/bleed easily.  All other systems reviewed and are negative.      Objective:   Physical Exam Vitals and nursing note reviewed.  Constitutional:      General: She is not in acute distress.    Appearance: Normal appearance. She is well-developed.  HENT:     Head: Normocephalic.     Nose: Nose normal.  Eyes:     Pupils: Pupils are equal, round, and reactive to light.  Neck:     Vascular: No carotid bruit or JVD.  Cardiovascular:     Rate and Rhythm: Normal rate and regular rhythm.     Heart sounds: Normal heart sounds.  Pulmonary:     Effort: Pulmonary effort is normal. No respiratory distress.     Breath sounds:  Normal breath sounds. No wheezing or rales.  Chest:     Chest wall: No tenderness.  Abdominal:     General: Bowel sounds are normal. There is no distension or abdominal bruit.     Palpations: Abdomen is soft. There is no hepatomegaly, splenomegaly, mass or pulsatile mass.     Tenderness: There is no abdominal tenderness.  Musculoskeletal:        General: Normal range of motion.     Cervical back: Normal range of motion and neck supple.  Lymphadenopathy:     Cervical: No cervical adenopathy.  Skin:    General: Skin is warm and dry.  Neurological:     Mental Status: She is alert and oriented to person, place, and time.     Deep Tendon Reflexes: Reflexes are normal and symmetric.  Psychiatric:        Behavior: Behavior normal.        Thought Content: Thought content normal.        Judgment: Judgment normal.    BP (!) 141/75   Pulse 63   Temp 98.6 F (37 C) (Temporal)   Resp 20   Ht '5\' 4"'  (1.626 m)   Wt 226 lb (102.5 kg)   SpO2 98%   BMI 38.79 kg/m         Assessment & Plan:  Doris Smith comes in today with chief complaint of Medical Management of Chronic Issues   Diagnosis and orders addressed:  1. Essential hypertension Low sodium diet - CBC with Differential/Platelet - CMP14+EGFR - Lipid panel  2. Paroxysmal atrial  fibrillation (HCC) Avoid caffeine  3. Gastroesophageal reflux disease without esophagitis Avoid spicy foods Do not eat 2 hours prior to bedtime - esomeprazole (NEXIUM) 40 MG capsule; Take 1 capsule (40 mg total) by mouth daily.  Dispense: 90 capsule; Refill: 1  4. Irritable bowel syndrome with both constipation and diarrhea Watch diet to help control symptoms  5. Anxiety associated with depression Stress management  6. Bipolar affective disorder, remission status unspecified ([place]) Continue counseling  7. Insomnia due to stress Bedtime routine  8. Obesity (BMI 30-39.9) Discussed diet and exercise for person with BMI >25 Will recheck weight in 3-6 months   Labs pending Health Maintenance reviewed Diet and exercise encouraged  Follow up plan: 6 months   [name] Done, FNP

## 2020-11-12 ENCOUNTER — Ambulatory Visit: Payer: BC Managed Care – PPO | Admitting: Gastroenterology

## 2020-11-15 DIAGNOSIS — Z780 Asymptomatic menopausal state: Secondary | ICD-10-CM | POA: Diagnosis not present

## 2020-11-15 DIAGNOSIS — M810 Age-related osteoporosis without current pathological fracture: Secondary | ICD-10-CM | POA: Diagnosis not present

## 2020-11-15 DIAGNOSIS — F410 Panic disorder [episodic paroxysmal anxiety] without agoraphobia: Secondary | ICD-10-CM | POA: Diagnosis not present

## 2020-11-15 DIAGNOSIS — F312 Bipolar disorder, current episode manic severe with psychotic features: Secondary | ICD-10-CM | POA: Diagnosis not present

## 2020-11-15 DIAGNOSIS — F4310 Post-traumatic stress disorder, unspecified: Secondary | ICD-10-CM | POA: Diagnosis not present

## 2020-11-15 NOTE — Progress Notes (Signed)
[place]  [phone] [phone]     ID: Doris Smith DOB: 10-Jun-1951  MR#: [id number]  CWC#:[id number]  Patient Care Team: Chevis Pretty, FNP as PCP - General (Nurse Practitioner) Lendon Colonel, NP as PCP - Cardiology (Nurse Practitioner) Alphonsa Overall, MD as Consulting Physician (General Surgery) Joseff Luckman, Virgie Dad, MD as Consulting Physician (Oncology) Kyung Rudd, MD as Consulting Physician (Radiation Oncology) Elijio Miles, MD as Consulting Physician (Pulmonary Disease) Netta Cedars, MD as Consulting Physician (Orthopedic Surgery) Susa Day, MD as Consulting Physician (Orthopedic Surgery) Janeth Rase, NP as Nurse Practitioner (Adult Health Nurse Practitioner) Mauro Kaufmann, RN as Oncology Nurse Navigator Rockwell Germany, RN as Oncology Nurse Navigator Irene Limbo, MD as Consulting Physician (Plastic Surgery) Minus Breeding, MD as Consulting Physician (Cardiology) Chauncey Cruel, MD OTHER MD: Chapman Moss, psychiatry   CHIEF COMPLAINT: Estrogen receptor positive breast cancer (s/p right mastectomy)  CURRENT TREATMENT: anastrozole; ibandronate   INTERVAL HISTORY: Doris Smith returns today for follow-up of her estrogen receptor positive breast cancer.   She continues on anastrozole.  She tolerated remarkably well with no obvious side effects that she is aware of.  She also continues on ibandronate.  She tells me she has run out of the medication and once it reordered.  She knows the process of taking it on an empty stomach with a glass of water and vertical  Since her last visit, she underwent repeat bone density screening on 11/11/2020 showing a T-score of -2.6  She is behind on her annual mammography (uni left), which was due in 08/2019 from [place].    REVIEW OF SYSTEMS: Doris Smith had a difficult Mother's Day because she lost her mother November 2021.  Her son stated, and cooks some  hamburgers and hotdogs and she enjoyed that.  She tells me she remains depressed.  She is going to see her counselor Chapman Moss and she says she is going to discuss whether they can change the medication perhaps lower medications.  She continues to have significant knee problems and this keeps her from walking as much as she would like.  She did have some falls.  She is using a cane.  Aside from these issues a detailed review of systems today was stable   COVID 19 VACCINATION STATUS: J&J May 2021, status post booster December 2021   HISTORY OF CURRENT ILLNESS: From the original intake note:  "Doris Smith" presented to her PCP, Dr. Hassell Done, with right breast pain, fullness, and nipple inversion since November 2019. She proceeded to undergo bilateral diagnostic mammography with tomography and right breast ultrasonography at [facility] on 07/31/2018 showing: findings compatible with multicentric breast cancer spanning at least the upper inner and upper outer quadrants of the right breast; normal right axilla. Palpable firmness of approximately 1.5 cm was noted in the 9 o'clock position, which was also seen on ultrasound with indisctinct margins measuring 2.7 x 2.3 x 2.2 cm. At 1 o'clock, a mass with poorly defined margins measures 1.9 x 1 x 0.8 cm. Additional poorly defined masses were seen in the 12 o'clock retroareolar region.  Accordingly on 08/02/2018 she proceeded to biopsy of the right breast area in question. The pathology ([id number]) from this procedure showed: invasive mammary carcinoma at 9 o'clock and 1 o'clock; e-cadherin negative, consistent with lobular phenotype; grade 2-3. Prognostic indicators significant for: estrogen receptor, 90% positive and progesterone receptor, 1% positive, both with strong staining intensity. Proliferation marker Ki67 at 1%. HER2 equivocal by immunohistochemistry, 2+  but negative by fluorescent in situ hybridization with a signals ratio 1.04 and number per cell 1.2.    The patient's subsequent history is as detailed below.   PAST MEDICAL HISTORY: Past Medical History:  Diagnosis Date  . A-fib ([place]) 11/2012   PAF  . Anxiety    takes Ativan  . Asthma 04/07/2011   dx  . Bipolar disorder ([place])   . Cancer [facility])    Right breast  . Depression   . Early cataracts, bilateral   . Fatty liver   . Fibromyalgia   . GERD (gastroesophageal reflux disease)   . Irritable bowel syndrome   . Mental disorder    dx bipolar  . PONV (postoperative nausea and vomiting)   . Sleep apnea 04/2011    does not wear CPAP  She has chronic sinus headaches, hiatal hernia   PAST SURGICAL HISTORY: Past Surgical History:  Procedure Laterality Date  . ABDOMINAL HYSTERECTOMY    . COLONOSCOPY    . DILATION AND CURETTAGE OF UTERUS  1981   abnormal pap  . KNEE ARTHROSCOPY Left 2007   x 2  . LUMBAR LAMINECTOMY/DECOMPRESSION MICRODISCECTOMY  05/31/2011   Procedure: LUMBAR LAMINECTOMY/DECOMPRESSION MICRODISCECTOMY;  Surgeon: Johnn Hai;  Location: [facility];  Service: Orthopedics;  Laterality: Left;  Decompression Lumbar four to five and  Lumbar five to Sacral one on Left  (X-Ray)  . MASTECTOMY W/ SENTINEL NODE BIOPSY Right 09/16/2018   Procedure: RIGHT MASTECTOMY WITH RIGHT AXILLARY SENTINEL LYMPH NODE BIOPSY;  Surgeon: Alphonsa Overall, MD;  Location: [place];  Service: General;  Laterality: Right;  . PARTIAL HYSTERECTOMY  1982  . PORTACATH PLACEMENT Left 10/31/2018   Procedure: INSERTION PORT-A-CATH WITH ULTRASOUND;  Surgeon: Alphonsa Overall, MD;  Location: [employer];  Service: General;  Laterality: Left;  . TONSILLECTOMY     as child  . TOTAL KNEE ARTHROPLASTY Left 12/18/2005    FAMILY HISTORY Family History  Problem Relation Age of Onset  . Anesthesia problems Mother   . Heart disease Mother        CHF, atrial fib  . Heart failure Mother   . Anesthesia problems Sister   . Colon polyps Father   . Heart disease Father        "Fluid around the heart"   . Hypertension Sister   . Breast cancer Maternal Aunt   . Colon cancer Maternal Aunt   . Prostate cancer Neg Hx   . Ovarian cancer Neg Hx   As of February 2019, patient father is alive at 65 years old. Patient mother died at age 17 in May 28, 2020. She notes a family hx of breast cancer. A maternal aunt was diagnosed with breast cancer, but Doris Smith is unsure if it was in one or both breasts. She has 3 siblings, 3 sisters and 0 brothers.   GYNECOLOGIC HISTORY:  No LMP recorded. Patient is postmenopausal. Menarche: 70 years old Age at first live birth: 70 years old [place] P 2 LMP about 42 years ago Contraceptive no HRT no  Hysterectomy? partial So? no   SOCIAL HISTORY:  Doris Smith worked in [employer] for 15 years. She is on disability because her back, knees, and nerves. Her husband, [name], has been at [employer] 43 years as a Freight forwarder.  Even though their home is in [place] they are actually living in [place] in an apartment while her husband works there.  Son Legrand Como, age 33, works as a Chief Strategy Officer in [place], [place]. Son Shanon Brow, age  26, works as a Building control surveyor in [place], [place]. They have 4 grandchildren, 2 great-grandchildren with 2 more on the way. She attends a [employer].   ADVANCED DIRECTIVES: In the absence of any documents to the contrary the patient's husband is her healthcare power of attorney   HEALTH MAINTENANCE: Social History   Tobacco Use  . Smoking status: Never Smoker  . Smokeless tobacco: Never Used  Vaping Use  . Vaping Use: Never used  Substance Use Topics  . Alcohol use: No  . Drug use: No     Colonoscopy: 2019  PAP: 11/03/2014, normal  Bone density: 10/13/2016, T-score of -2.1   Allergies  Allergen Reactions  . Iohexol     "over 20 years ago" had reaction "hurting in arm and heart"-Done in [place], [place].Marland Kitchenphysician stopped CT at that time.  . Codeine Other (See Comments)    Makes feel like she is wild   . Palonosetron Other (See Comments)    headache  .  Prednisone Other (See Comments)    Makes me very irritable  . Sulfonamide Derivatives Other (See Comments)    Upset stomach  . Celecoxib Nausea And Vomiting  . Sulfa Antibiotics Nausea And Vomiting  . Vitamin B12 Rash    Current Outpatient Medications  Medication Sig Dispense Refill  . anastrozole (ARIMIDEX) 1 MG tablet Take 1 tablet (1 mg total) by mouth daily. 90 tablet 4  . apixaban (ELIQUIS) 5 MG TABS tablet Take 1 tablet (5 mg total) by mouth 2 (two) times daily. 180 tablet 3  . clotrimazole-betamethasone (LOTRISONE) cream APPLY  CREAM TOPICALLY TWICE DAILY 45 g 0  . esomeprazole (NEXIUM) 40 MG capsule Take 1 capsule (40 mg total) by mouth daily. 90 capsule 1  . gabapentin (NEURONTIN) 300 MG capsule TAKE 1 CAPSULE BY MOUTH THREE TIMES DAILY 90 capsule 0  . hydrOXYzine (ATARAX/VISTARIL) 10 MG tablet Take 1 tablet (10 mg total) by mouth 3 (three) times daily as needed for itching. 30 tablet 0  . ibandronate (BONIVA) 150 MG tablet Take 1 tablet (150 mg total) by mouth every 30 (thirty) days. Take in the morning with a full glass of water, on an empty stomach, and do not take anything else by mouth or lie down for the next 30 min. 3 tablet 4  . lamoTRIgine (LAMICTAL) 100 MG tablet Take 1 tablet (100 mg total) by mouth in the morning, at noon, and at bedtime. 180 tablet 1  . loratadine (CLARITIN) 10 MG tablet Take 10 mg by mouth daily.    . metoprolol tartrate (LOPRESSOR) 25 MG tablet Take 1 tablet (25 mg total) by mouth 2 (two) times daily. 180 tablet 3  . midodrine (PROAMATINE) 2.5 MG tablet Take 1 tablet (2.5 mg total) by mouth 3 (three) times daily with meals. 270 tablet 3  . promethazine-dextromethorphan (PROMETHAZINE-DM) 6.25-15 MG/5ML syrup TAKE 5 MILLILITERS BY MOUTH 4 TIMES DAILY AS NEEDED FOR COUGH    . QUEtiapine (SEROQUEL) 25 MG tablet Take 1 tablet (25 mg total) by mouth at bedtime. 30 tablet 0  . triamcinolone (KENALOG) 0.1 % Apply 1 application topically 2 (two) times daily.     . Vilazodone HCl (VIIBRYD) 40 MG TABS Take 1 tablet (40 mg total) by mouth daily. 90 tablet 1   No current facility-administered medications for this visit.   Facility-Administered Medications Ordered in Other Visits  Medication Dose Route Frequency Provider Last Rate Last Admin  . heparin lock flush 100 unit/mL  500 Units Intravenous Once Guenther Dunshee, Sarajane Jews  C, MD      . sodium chloride flush (NS) 0.9 % injection 10 mL  10 mL Intravenous PRN Abayomi Pattison, Virgie Dad, MD        OBJECTIVE: white woman who appears stated age  Vitals:   11/16/20 1106  BP: 121/67  Pulse: (!) 55  Resp: 18  Temp: (!) 97.5 F (36.4 C)  SpO2: 100%   Wt Readings from Last 3 Encounters:  11/16/20 225 lb 12.8 oz (102.4 kg)  11/11/20 226 lb (102.5 kg)  07/22/20 231 lb 1.6 oz (104.8 kg)   Body mass index is 38.76 kg/m.    ECOG FS:2 - Symptomatic, <50% confined to bed  Sclerae unicteric, EOMs intact Wearing a mask No cervical or supraclavicular adenopathy Lungs no rales or rhonchi Heart regular rate and rhythm Abd soft, nontender, positive bowel sounds MSK no focal spinal tenderness, no upper extremity lymphedema Neuro: nonfocal, well oriented, appropriate affect Breasts: The right breast is status post mastectomy.  There is no evidence of local recurrence.  The left breast is benign.  Both axillae are benign.   LAB RESULTS:  CMP     Component Value Date/Time   NA 142 11/11/2020 1049   K 3.9 11/11/2020 1049   CL 104 11/11/2020 1049   CO2 25 11/11/2020 1049   GLUCOSE 87 11/11/2020 1049   GLUCOSE 124 (H) 07/22/2020 0855   BUN 8 11/11/2020 1049   CREATININE 0.79 11/11/2020 1049   CREATININE 0.90 12/19/2018 1434   CALCIUM 9.0 11/11/2020 1049   PROT 6.2 11/11/2020 1049   ALBUMIN 3.8 11/11/2020 1049   AST 9 11/11/2020 1049   AST 14 (L) 08/14/2018 0758   ALT 7 11/11/2020 1049   ALT 10 08/14/2018 0758   ALKPHOS 129 (H) 11/11/2020 1049   BILITOT 0.4 11/11/2020 1049   BILITOT 0.5 08/14/2018 0758    GFRNONAA >60 07/22/2020 0855   GFRNONAA >60 12/19/2018 1434   GFRAA 81 02/03/2020 1049   GFRAA >60 12/19/2018 1434    No results found for: TOTALPROTELP, ALBUMINELP, A1GS, A2GS, BETS, BETA2SER, GAMS, MSPIKE, SPEI  No results found for: KPAFRELGTCHN, LAMBDASER, KAPLAMBRATIO  Lab Results  Component Value Date   WBC 4.8 11/16/2020   NEUTROABS 3.3 11/16/2020   HGB 11.4 (L) 11/16/2020   HCT 35.3 (L) 11/16/2020   MCV 86.3 11/16/2020   PLT 278 11/16/2020   No results found for: LABCA2  No components found for: [id number]  No results for input(s): INR in the last 168 hours.  No results found for: LABCA2  No results found for: ITG549  No results found for: IYM415  No results found for: AXE940  No results found for: CA2729  No components found for: HGQUANT  No results found for: CEA1 / No results found for: CEA1   No results found for: AFPTUMOR  No results found for: CHROMOGRNA  No results found for: HGBA, HGBA2QUANT, HGBFQUANT, HGBSQUAN (Hemoglobinopathy evaluation)   No results found for: LDH  No results found for: IRON, TIBC, IRONPCTSAT (Iron and TIBC)  No results found for: FERRITIN  Urinalysis    Component Value Date/Time   COLORURINE YELLOW 12/19/2018 1355   APPEARANCEUR Clear 02/03/2020 1045   LABSPEC 1.016 12/19/2018 1355   PHURINE 5.0 12/19/2018 1355   GLUCOSEU Negative 02/03/2020 [place] 12/19/2018 1355   BILIRUBINUR Negative 02/03/2020 [place] 12/19/2018 1355   PROTEINUR Negative 02/03/2020 [place] 12/19/2018 1355   UROBILINOGEN negative 11/03/2014 1039  UROBILINOGEN 0.2 05/25/2011 0923   NITRITE Negative 02/03/2020 1045   NITRITE NEGATIVE 12/19/2018 1355   LEUKOCYTESUR Negative 02/03/2020 1045   LEUKOCYTESUR SMALL (A) 12/19/2018 1355    STUDIES: DG [facility] DEXA  Result Date: 11/16/2020 CLINICAL DATA:  70 year old postmenopausal Caucasian female. Personal history of ankle fractures as an  adult. Personal history of metastatic breast cancer. Follow-up low bone mass/osteopenia identified on prior bone density examination. EXAM: DUAL X-RAY ABSORPTIOMETRY (DXA) FOR BONE MINERAL DENSITY TECHNIQUE: Bone mineral density measurements are performed of the spine, hip, and forearm, as appropriate, per International Society of Clinical Densitometry recommendations. The pertinent regions of interest are reported below. Non-contributory values are not reported. Images are obtained for bone mineral density measurement and are not obtained for diagnostic purposes. FINDINGS: AP LUMBAR SPINE: Excluded as it had been on prior examinations due to diffuse degenerative changes resulting in falsely elevated T-scores. RIGHT FEMUR NECK Bone Mineral Density (BMD):  0.675 g/cm2 Young Adult T-Score: -2.6 Z-Score:  -1.8 LEFT FOREARM (1/3 RADIUS) Bone Mineral Density (BMD):  0.876 Young Adult T Score:  -0.1 Z Score:  1.5 Unit: This study was performed at [facility] on a [place]. Scan quality: The scan quality is good. Exclusions: Lumbar spine. ASSESSMENT: Patient's diagnostic category is OSTEOPOROSIS by WHO Criteria. FRACTURE RISK: INCREASED. FRAX: World Health Organization FRAX assessment of absolute fracture risk is not calculated for this patient because the patient has osteoporosis. COMPARISON: 10/13/2016 dating back to 06/19/2005. Since the prior examination in 2018, there has been a statistically significant 6.3% decrease in bone mineral density in the [place] and a statistically significant 5.4% decrease in bone mineral density in the LEFT FOREARM. (The 2018 examination was the baseline evaluation of the LEFT FOREARM). Since the baseline examination in 2006, there has been a statistically significant 16.6% decrease in bone mineral density in the TOTAL MEAN FEMUR. RECOMMENDATIONS 1. All patients should optimize calcium and vitamin D intake. 2. Consider FDA-approved medical  therapies in postmenopausal women and men aged 61 years and older, based on the following: - A hip or vertebral (clinical or morphometric) fracture - T-score less than or equal to -2.5 at the femoral neck or spine after appropriate evaluation to exclude secondary causes - Low bone mass (T-score between -1.0 and -2.5 at the femoral neck or spine) and a 10-year probability of a hip fracture greater than or equal to 3% or a 10-year probability of a major osteoporosis-related fracture greater than or equal to 20% based on the [place]-adapted WHO algorithm - Clinician judgment and/or patient preferences may indicate treatment for people with 10-year fracture probabilities above or below these levels 3. Patients with diagnosis of osteoporosis or at high risk for fracture should have regular bone mineral density tests. For patients eligible for Medicare, routine testing is allowed once every 2 years. The testing frequency can be increased to one year for patients who have rapidly progressing disease, those who are receiving or discontinuing medical therapy to restore bone mass, or have additional risk factors. On reading this paragraph fluency reporting is adjusting my profile for better speech ALL PERSON, [facility], [facility] [facility] [place], [facility], [facility], AND THE DEXA MOBILE UNIT report is created by rad in Fluency - use correct DXA template based on sex, menopausal status, age, etc. Electronically Signed   By: Evangeline Dakin M.D.   On: 11/16/2020 08:15     ELIGIBLE FOR AVAILABLE RESEARCH PROTOCOL: no   ASSESSMENT: 70 y.o. [place], [place] woman status  post right breast biopsy 08/02/2018 for a clinical T3 N0, stage IIA invasive lobular carcinoma, grade 2, estrogen receptor strongly positive, progesterone receptor 1% positive, with no HER-2 amplification and an MIB-1 of 1%.  (a) CT scan of the head and chest, without contrast 08/23/2018 showed nonspecific 0.4 cm left lower lobe pulmonary nodule, no definitive metastatic  disease  (b) bone scan 08/23/2018 shows multiple spinal areas of abnormal uptake, but  (c) total spinal MRI 09/03/2018 finds bone scan findings to be secondary to degenerative disease, no evidence of metastatic disease.  (1) status post right mastectomy and sentinel lymph node sampling 09/16/2018 showing a pT3 pN1(mic), stage IIA-IIIA invasive lobular breast cancer, grade 2, with 1 of 5 sampled lymph nodes involved by micrometastatic deposit, with extra nodular extension; ample margins  (2)  MammaPrint "high risk" suggests a 5-year metastasis free survival of 93% with chemotherapy, with an absolute chemotherapy benefit in the greater than 12% range  (3) adjuvant radiation 12/31/2018-02/19/2019:  The right chest wall and regional nodes were treated to 50.4 Gy in 28 fractions followed by a 10 Gy boost over 5 fractions.   (4) anastrozole started neoadjuvantly (on 08/14/2018), discontinued 10/14/2018 in preparation for chemotherapy, resumed October 2020  (a) bone density 10/13/2016 showed osteopenia with a lowest score at -2.1  (b) ibandronate/Boniva started January 2021  (c) bone density 11/11/2020 shows a T score of -2.6  (5) [facility] testing on mastectomy sample (09/16/2018) showed stable MSI and proficient mismatch repair status, with a low mutational burden; BRCA 1 and 2 were not mutated, PI K3 was not mutated, ER B B2 was not mutated, and AKT 1 was not mutated.  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  This is very favorable.  She  is tolerating anastrozole well and the plan is to continue that a total of 5 years.  She remains somewhat fragile and will see her counselor later today.  I am going to see her again in 21month chiefly for reassurance.  Today we did discuss osteoporosis issues.  I am starting her on vitamin D.  I strongly recommended she walk as much as she is able to using a cane.  She will continue the ibandronate as before.  I refilled the appropriate drugs  She knows to call for any other issue that may develop before the next visit  Total encounter time 25 minutes.*Sarajane JewsC. Taelor Moncada, MD  11/16/20 11:27 AM [facility] On[facility] H[facility] [id number]. [phone]   Fax. [phone]  I, KWilburn Mylar am acting as scribe for Dr. GVirgie Dad Antoninette Lerner.  I, GLurline DelMD, have reviewed the above documentation for accuracy and completeness, and I agree with the above.   *Total Encounter Time as defined by the Centers for Medicare and Medicaid Services includes, in addition to the face-to-face time of a patient visit (documented in the note above) non-face-to-face time: obtaining and reviewing outside history, ordering and reviewing medications, tests or procedures, care coordination (communications with other health  care professionals or caregivers) and documentation in the medical record.

## 2020-11-16 ENCOUNTER — Other Ambulatory Visit: Payer: Self-pay

## 2020-11-16 ENCOUNTER — Inpatient Hospital Stay: Payer: BC Managed Care – PPO

## 2020-11-16 ENCOUNTER — Inpatient Hospital Stay: Payer: BC Managed Care – PPO | Attending: Oncology | Admitting: Oncology

## 2020-11-16 VITALS — BP 121/67 | HR 55 | Temp 97.5°F | Resp 18 | Ht 64.0 in | Wt 225.8 lb

## 2020-11-16 DIAGNOSIS — Z79811 Long term (current) use of aromatase inhibitors: Secondary | ICD-10-CM | POA: Diagnosis not present

## 2020-11-16 DIAGNOSIS — C50811 Malignant neoplasm of overlapping sites of right female breast: Secondary | ICD-10-CM | POA: Diagnosis not present

## 2020-11-16 DIAGNOSIS — F312 Bipolar disorder, current episode manic severe with psychotic features: Secondary | ICD-10-CM | POA: Diagnosis not present

## 2020-11-16 DIAGNOSIS — F4310 Post-traumatic stress disorder, unspecified: Secondary | ICD-10-CM | POA: Diagnosis not present

## 2020-11-16 DIAGNOSIS — Z170 Estrogen receptor positive status [ER+]: Secondary | ICD-10-CM | POA: Diagnosis not present

## 2020-11-16 DIAGNOSIS — F410 Panic disorder [episodic paroxysmal anxiety] without agoraphobia: Secondary | ICD-10-CM | POA: Diagnosis not present

## 2020-11-16 DIAGNOSIS — C50411 Malignant neoplasm of upper-outer quadrant of right female breast: Secondary | ICD-10-CM | POA: Diagnosis not present

## 2020-11-16 LAB — COMPREHENSIVE METABOLIC PANEL
ALT: <6 U/L (ref 0–44)
AST: 15 U/L (ref 15–41)
Albumin: 3.5 g/dL (ref 3.5–5.0)
Alkaline Phosphatase: 106 U/L (ref 38–126)
Anion gap: 7 (ref 5–15)
BUN: 8 mg/dL (ref 8–23)
CO2: 29 mmol/L (ref 22–32)
Calcium: 8.9 mg/dL (ref 8.9–10.3)
Chloride: 105 mmol/L (ref 98–111)
Creatinine, Ser: 0.93 mg/dL (ref 0.44–1.00)
GFR, Estimated: >60 mL/min (ref >60)
Glucose, Bld: 111 mg/dL — ABNORMAL HIGH (ref 70–99)
Potassium: 3.9 mmol/L (ref 3.5–5.1)
Sodium: 141 mmol/L (ref 135–145)
Total Bilirubin: 0.4 mg/dL (ref 0.3–1.2)
Total Protein: 6.9 g/dL (ref 6.5–8.1)

## 2020-11-16 LAB — CBC WITH DIFFERENTIAL/PLATELET
Abs Immature Granulocytes: 0.01 10*3/uL (ref 0.00–0.07)
Basophils Absolute: 0.1 10*3/uL (ref 0.0–0.1)
Basophils Relative: 1 %
Eosinophils Absolute: 0.1 10*3/uL (ref 0.0–0.5)
Eosinophils Relative: 3 %
HCT: 35.3 % — ABNORMAL LOW (ref 36.0–46.0)
Hemoglobin: 11.4 g/dL — ABNORMAL LOW (ref 12.0–15.0)
Immature Granulocytes: 0 %
Lymphocytes Relative: 16 %
Lymphs Abs: 0.8 10*3/uL (ref 0.7–4.0)
MCH: 27.9 pg (ref 26.0–34.0)
MCHC: 32.3 g/dL (ref 30.0–36.0)
MCV: 86.3 fL (ref 80.0–100.0)
Monocytes Absolute: 0.6 10*3/uL (ref 0.1–1.0)
Monocytes Relative: 12 %
Neutro Abs: 3.3 10*3/uL (ref 1.7–7.7)
Neutrophils Relative %: 68 %
Platelets: 278 10*3/uL (ref 150–400)
RBC: 4.09 MIL/uL (ref 3.87–5.11)
RDW: 14.8 % (ref 11.5–15.5)
WBC: 4.8 10*3/uL (ref 4.0–10.5)
nRBC: 0 % (ref 0.0–0.2)

## 2020-11-16 MED ORDER — VITAMIN D 25 MCG (1000 UNIT) PO TABS: 1000.0000 [IU] | ORAL_TABLET | Freq: Every day | ORAL | 4 refills | Status: DC

## 2020-11-16 MED ORDER — IBANDRONATE SODIUM 150 MG PO TABS: 150.0000 mg | ORAL_TABLET | ORAL | 4 refills | Status: DC

## 2020-11-16 MED ORDER — ANASTROZOLE 1 MG PO TABS: 1.0000 mg | ORAL_TABLET | Freq: Every day | ORAL | 4 refills | Status: DC

## 2020-11-29 DIAGNOSIS — F4310 Post-traumatic stress disorder, unspecified: Secondary | ICD-10-CM | POA: Diagnosis not present

## 2020-11-29 DIAGNOSIS — F312 Bipolar disorder, current episode manic severe with psychotic features: Secondary | ICD-10-CM | POA: Diagnosis not present

## 2020-11-29 DIAGNOSIS — F410 Panic disorder [episodic paroxysmal anxiety] without agoraphobia: Secondary | ICD-10-CM | POA: Diagnosis not present

## 2020-12-03 ENCOUNTER — Encounter: Payer: Self-pay | Admitting: Oncology

## 2020-12-13 DIAGNOSIS — F312 Bipolar disorder, current episode manic severe with psychotic features: Secondary | ICD-10-CM | POA: Diagnosis not present

## 2020-12-13 DIAGNOSIS — F4310 Post-traumatic stress disorder, unspecified: Secondary | ICD-10-CM | POA: Diagnosis not present

## 2020-12-13 DIAGNOSIS — F410 Panic disorder [episodic paroxysmal anxiety] without agoraphobia: Secondary | ICD-10-CM | POA: Diagnosis not present

## 2020-12-21 DIAGNOSIS — F410 Panic disorder [episodic paroxysmal anxiety] without agoraphobia: Secondary | ICD-10-CM | POA: Diagnosis not present

## 2020-12-21 DIAGNOSIS — F312 Bipolar disorder, current episode manic severe with psychotic features: Secondary | ICD-10-CM | POA: Diagnosis not present

## 2020-12-21 DIAGNOSIS — F4310 Post-traumatic stress disorder, unspecified: Secondary | ICD-10-CM | POA: Diagnosis not present

## 2020-12-27 DIAGNOSIS — F410 Panic disorder [episodic paroxysmal anxiety] without agoraphobia: Secondary | ICD-10-CM | POA: Diagnosis not present

## 2020-12-27 DIAGNOSIS — F312 Bipolar disorder, current episode manic severe with psychotic features: Secondary | ICD-10-CM | POA: Diagnosis not present

## 2020-12-27 DIAGNOSIS — F4310 Post-traumatic stress disorder, unspecified: Secondary | ICD-10-CM | POA: Diagnosis not present

## 2020-12-29 ENCOUNTER — Other Ambulatory Visit: Payer: Self-pay | Admitting: Cardiology

## 2020-12-30 NOTE — Telephone Encounter (Signed)
59f, 102.4kg, scr 0.93 11/16/20, lovw/cleaver 04/27/20

## 2021-01-18 DIAGNOSIS — F312 Bipolar disorder, current episode manic severe with psychotic features: Secondary | ICD-10-CM | POA: Diagnosis not present

## 2021-01-18 DIAGNOSIS — F410 Panic disorder [episodic paroxysmal anxiety] without agoraphobia: Secondary | ICD-10-CM | POA: Diagnosis not present

## 2021-01-18 DIAGNOSIS — F4310 Post-traumatic stress disorder, unspecified: Secondary | ICD-10-CM | POA: Diagnosis not present

## 2021-01-28 DIAGNOSIS — E669 Obesity, unspecified: Secondary | ICD-10-CM | POA: Diagnosis not present

## 2021-01-28 DIAGNOSIS — R030 Elevated blood-pressure reading, without diagnosis of hypertension: Secondary | ICD-10-CM | POA: Diagnosis not present

## 2021-01-28 DIAGNOSIS — N390 Urinary tract infection, site not specified: Secondary | ICD-10-CM | POA: Diagnosis not present

## 2021-01-28 DIAGNOSIS — I4891 Unspecified atrial fibrillation: Secondary | ICD-10-CM | POA: Diagnosis not present

## 2021-01-30 ENCOUNTER — Other Ambulatory Visit: Payer: Self-pay | Admitting: Adult Health

## 2021-02-01 ENCOUNTER — Telehealth: Payer: Self-pay | Admitting: Cardiology

## 2021-02-01 ENCOUNTER — Other Ambulatory Visit: Payer: Self-pay

## 2021-02-01 NOTE — Telephone Encounter (Signed)
*  STAT* If patient is at the pharmacy, call can be transferred to refill team.   1. Which medications need to be refilled? (please list name of each medication and dose if known) metoprolol tartrate (LOPRESSOR) 25 MG tablet  2. Which pharmacy/location (including street and city if local pharmacy) is medication to be sent to? [place]  3. Do they need a 30 day or 90 day supply?  90 day supply

## 2021-02-11 ENCOUNTER — Other Ambulatory Visit: Payer: BC Managed Care – PPO

## 2021-02-23 ENCOUNTER — Encounter: Payer: Self-pay | Admitting: Gastroenterology

## 2021-02-28 ENCOUNTER — Other Ambulatory Visit: Payer: Self-pay | Admitting: Adult Health

## 2021-02-28 ENCOUNTER — Other Ambulatory Visit: Payer: Self-pay | Admitting: Nurse Practitioner

## 2021-02-28 ENCOUNTER — Other Ambulatory Visit: Payer: Self-pay | Admitting: Cardiology

## 2021-02-28 DIAGNOSIS — K219 Gastro-esophageal reflux disease without esophagitis: Secondary | ICD-10-CM

## 2021-03-03 DIAGNOSIS — M1711 Unilateral primary osteoarthritis, right knee: Secondary | ICD-10-CM | POA: Diagnosis not present

## 2021-03-03 DIAGNOSIS — M25561 Pain in right knee: Secondary | ICD-10-CM | POA: Diagnosis not present

## 2021-03-07 DIAGNOSIS — M25561 Pain in right knee: Secondary | ICD-10-CM | POA: Diagnosis not present

## 2021-03-08 DIAGNOSIS — F410 Panic disorder [episodic paroxysmal anxiety] without agoraphobia: Secondary | ICD-10-CM | POA: Diagnosis not present

## 2021-03-08 DIAGNOSIS — F4310 Post-traumatic stress disorder, unspecified: Secondary | ICD-10-CM | POA: Diagnosis not present

## 2021-03-08 DIAGNOSIS — F312 Bipolar disorder, current episode manic severe with psychotic features: Secondary | ICD-10-CM | POA: Diagnosis not present

## 2021-03-16 DIAGNOSIS — M25561 Pain in right knee: Secondary | ICD-10-CM | POA: Diagnosis not present

## 2021-03-22 DIAGNOSIS — F4310 Post-traumatic stress disorder, unspecified: Secondary | ICD-10-CM | POA: Diagnosis not present

## 2021-03-22 DIAGNOSIS — F410 Panic disorder [episodic paroxysmal anxiety] without agoraphobia: Secondary | ICD-10-CM | POA: Diagnosis not present

## 2021-03-22 DIAGNOSIS — F312 Bipolar disorder, current episode manic severe with psychotic features: Secondary | ICD-10-CM | POA: Diagnosis not present

## 2021-03-23 DIAGNOSIS — M25561 Pain in right knee: Secondary | ICD-10-CM | POA: Diagnosis not present

## 2021-03-25 ENCOUNTER — Ambulatory Visit: Payer: BC Managed Care – PPO | Admitting: Nurse Practitioner

## 2021-03-30 ENCOUNTER — Other Ambulatory Visit: Payer: Self-pay | Admitting: Cardiology

## 2021-03-30 ENCOUNTER — Other Ambulatory Visit: Payer: Self-pay | Admitting: Nurse Practitioner

## 2021-03-30 ENCOUNTER — Other Ambulatory Visit: Payer: Self-pay | Admitting: Adult Health

## 2021-03-30 DIAGNOSIS — F3160 Bipolar disorder, current episode mixed, unspecified: Secondary | ICD-10-CM

## 2021-03-30 DIAGNOSIS — M25561 Pain in right knee: Secondary | ICD-10-CM | POA: Diagnosis not present

## 2021-04-06 DIAGNOSIS — M25561 Pain in right knee: Secondary | ICD-10-CM | POA: Diagnosis not present

## 2021-04-11 ENCOUNTER — Ambulatory Visit
Admission: RE | Admit: 2021-04-11 | Discharge: 2021-04-11 | Disposition: A | Discharge status: Home / Self Care | Payer: BC Managed Care – PPO | Source: Ambulatory Visit | Attending: Oncology | Admitting: Oncology

## 2021-04-11 ENCOUNTER — Encounter: Payer: Self-pay | Admitting: Oncology

## 2021-04-11 ENCOUNTER — Other Ambulatory Visit: Payer: Self-pay

## 2021-04-11 DIAGNOSIS — F319 Bipolar disorder, unspecified: Secondary | ICD-10-CM

## 2021-04-11 DIAGNOSIS — M5137 Other intervertebral disc degeneration, lumbosacral region: Secondary | ICD-10-CM

## 2021-04-11 DIAGNOSIS — K589 Irritable bowel syndrome without diarrhea: Secondary | ICD-10-CM

## 2021-04-11 DIAGNOSIS — F4310 Post-traumatic stress disorder, unspecified: Secondary | ICD-10-CM | POA: Diagnosis not present

## 2021-04-11 DIAGNOSIS — F4321 Adjustment disorder with depressed mood: Secondary | ICD-10-CM

## 2021-04-11 DIAGNOSIS — F312 Bipolar disorder, current episode manic severe with psychotic features: Secondary | ICD-10-CM | POA: Diagnosis not present

## 2021-04-11 DIAGNOSIS — Z1231 Encounter for screening mammogram for malignant neoplasm of breast: Secondary | ICD-10-CM | POA: Diagnosis not present

## 2021-04-11 DIAGNOSIS — R002 Palpitations: Secondary | ICD-10-CM

## 2021-04-11 DIAGNOSIS — C50411 Malignant neoplasm of upper-outer quadrant of right female breast: Secondary | ICD-10-CM

## 2021-04-11 DIAGNOSIS — F410 Panic disorder [episodic paroxysmal anxiety] without agoraphobia: Secondary | ICD-10-CM | POA: Diagnosis not present

## 2021-04-11 DIAGNOSIS — I4891 Unspecified atrial fibrillation: Secondary | ICD-10-CM

## 2021-04-11 DIAGNOSIS — Z170 Estrogen receptor positive status [ER+]: Secondary | ICD-10-CM

## 2021-04-15 DIAGNOSIS — M25561 Pain in right knee: Secondary | ICD-10-CM | POA: Diagnosis not present

## 2021-04-19 ENCOUNTER — Encounter: Payer: Self-pay | Admitting: Gastroenterology

## 2021-04-19 ENCOUNTER — Ambulatory Visit (INDEPENDENT_AMBULATORY_CARE_PROVIDER_SITE_OTHER): Payer: BC Managed Care – PPO | Admitting: Gastroenterology

## 2021-04-19 VITALS — BP 104/62 | HR 68 | Ht 64.0 in | Wt 234.0 lb

## 2021-04-19 DIAGNOSIS — R140 Abdominal distension (gaseous): Secondary | ICD-10-CM

## 2021-04-19 DIAGNOSIS — S20312A Abrasion of left front wall of thorax, initial encounter: Secondary | ICD-10-CM | POA: Diagnosis not present

## 2021-04-19 DIAGNOSIS — S299XXA Unspecified injury of thorax, initial encounter: Secondary | ICD-10-CM | POA: Diagnosis not present

## 2021-04-19 DIAGNOSIS — K449 Diaphragmatic hernia without obstruction or gangrene: Secondary | ICD-10-CM | POA: Diagnosis not present

## 2021-04-19 DIAGNOSIS — Y999 Unspecified external cause status: Secondary | ICD-10-CM | POA: Diagnosis not present

## 2021-04-19 DIAGNOSIS — Z041 Encounter for examination and observation following transport accident: Secondary | ICD-10-CM | POA: Diagnosis not present

## 2021-04-19 DIAGNOSIS — K581 Irritable bowel syndrome with constipation: Secondary | ICD-10-CM

## 2021-04-19 DIAGNOSIS — K219 Gastro-esophageal reflux disease without esophagitis: Secondary | ICD-10-CM | POA: Diagnosis not present

## 2021-04-19 DIAGNOSIS — M47814 Spondylosis without myelopathy or radiculopathy, thoracic region: Secondary | ICD-10-CM | POA: Diagnosis not present

## 2021-04-19 DIAGNOSIS — I700 Atherosclerosis of aorta: Secondary | ICD-10-CM | POA: Diagnosis not present

## 2021-04-19 DIAGNOSIS — Z1211 Encounter for screening for malignant neoplasm of colon: Secondary | ICD-10-CM | POA: Diagnosis not present

## 2021-04-19 DIAGNOSIS — I517 Cardiomegaly: Secondary | ICD-10-CM | POA: Diagnosis not present

## 2021-04-19 DIAGNOSIS — Z90710 Acquired absence of both cervix and uterus: Secondary | ICD-10-CM | POA: Diagnosis not present

## 2021-04-19 DIAGNOSIS — R079 Chest pain, unspecified: Secondary | ICD-10-CM | POA: Diagnosis not present

## 2021-04-19 DIAGNOSIS — R9431 Abnormal electrocardiogram [ECG] [EKG]: Secondary | ICD-10-CM | POA: Diagnosis not present

## 2021-04-19 DIAGNOSIS — R109 Unspecified abdominal pain: Secondary | ICD-10-CM | POA: Diagnosis not present

## 2021-04-19 MED ORDER — LINACLOTIDE 72 MCG PO CAPS: 72.0000 ug | ORAL_CAPSULE | Freq: Every day | ORAL | 3 refills | Status: DC

## 2021-04-19 MED ORDER — DICYCLOMINE HCL 10 MG PO CAPS
10.0000 mg | ORAL_CAPSULE | Freq: Three times a day (TID) | ORAL | 0 refills | Status: DC
Start: 2021-04-19 — End: 2021-05-16

## 2021-04-19 NOTE — Progress Notes (Signed)
Doris Smith    [id number]    04/25/1951  Primary Care Physician:Martin, [name], FNP  Referring Physician: Chevis Pretty, [address],  [place] [address]   Chief complaint:  Abdominal bloating  HPI: 70 year old very pleasant female with history of right breast cancer s/p mastectomy, adjuvant chemo and radiation in clinical remission here with complaints of abdominal bloating, and irregular bowel habits She feels full with significant abdominal bloating and discomfort after she eats or drinks anything. She is taking Nexium daily with only occasional breakthrough heartburn.  No dysphagia or odynophagia.  EGD June 26, 2017 showed hiatal hernia otherwise unremarkable exam.  She has irregular bowel habits, continues to have intermittent constipation and diarrhea   Colonoscopy in December 2009 was normal, random biopsies negative for microscopic colitis Denies any nausea   She has significant back pain from degenerative disc disease.  She wants to complete rehab for her back before she undergoes any GI procedures. She is trying to cope with all her medical issues including mastectomy, is seeing a counselor every 2 weeks   Outpatient Encounter Medications as of 04/19/2021  Medication Sig   anastrozole (ARIMIDEX) 1 MG tablet Take 1 tablet (1 mg total) by mouth daily.   apixaban (ELIQUIS) 5 MG TABS tablet Take 1 tablet by mouth twice daily   clotrimazole-betamethasone (LOTRISONE) cream APPLY  CREAM TOPICALLY TWICE DAILY   esomeprazole (NEXIUM) 40 MG capsule Take 1 capsule by mouth once daily   gabapentin (NEURONTIN) 300 MG capsule TAKE 1 CAPSULE BY MOUTH THREE TIMES DAILY   ibandronate (BONIVA) 150 MG tablet Take 1 tablet (150 mg total) by mouth every 30 (thirty) days. Take in the morning with a full glass of water, on an empty stomach, and do not take anything else by mouth or lie down for the next 30 min.   loratadine (CLARITIN) 10  MG tablet Take 10 mg by mouth daily.   metoprolol tartrate (LOPRESSOR) 25 MG tablet Take 1 tablet by mouth twice daily   midodrine (PROAMATINE) 2.5 MG tablet TAKE 1 TABLET BY MOUTH THREE TIMES DAILY WITH MEALS   promethazine-dextromethorphan (PROMETHAZINE-DM) 6.25-15 MG/5ML syrup TAKE 5 MILLILITERS BY MOUTH 4 TIMES DAILY AS NEEDED FOR COUGH   triamcinolone (KENALOG) 0.1 % Apply 1 application topically 2 (two) times daily.   Vilazodone HCl (VIIBRYD) 40 MG TABS Take 1 tablet by mouth once daily   [DISCONTINUED] cholecalciferol (VITAMIN D3) 25 MCG (1000 UNIT) tablet Take 1 tablet (1,000 Units total) by mouth daily.   [DISCONTINUED] hydrOXYzine (ATARAX/VISTARIL) 10 MG tablet Take 1 tablet (10 mg total) by mouth 3 (three) times daily as needed for itching.   [DISCONTINUED] lamoTRIgine (LAMICTAL) 100 MG tablet Take 1 tablet (100 mg total) by mouth in the morning, at noon, and at bedtime.   [DISCONTINUED] QUEtiapine (SEROQUEL) 25 MG tablet Take 1 tablet (25 mg total) by mouth at bedtime.   Facility-Administered Encounter Medications as of 04/19/2021  Medication   heparin lock flush 100 unit/mL   sodium chloride flush (NS) 0.9 % injection 10 mL    Allergies as of 04/19/2021 - Review Complete 04/19/2021  Allergen Reaction Noted   Iohexol  11/06/2012   Codeine Other (See Comments) 05/14/2008   Palonosetron Other (See Comments) 04/16/2019   Prednisone Other (See Comments) 12/18/2017   Sulfonamide derivatives Other (See Comments) 05/14/2008   Celecoxib Nausea And Vomiting 12/10/2015   Sulfa antibiotics Nausea And Vomiting 09/11/2015   Vitamin  b12 Rash 11/05/2012    Past Medical History:  Diagnosis Date   A-fib ([place]) 11/2012   PAF   Anxiety    takes Ativan   Asthma 04/07/2011   dx   Bipolar disorder ([place])    Cancer ([place])    Right breast   Depression    Early cataracts, bilateral    Fatty liver    Fibromyalgia    GERD (gastroesophageal reflux disease)    Irritable bowel syndrome     Mental disorder    dx bipolar   PONV (postoperative nausea and vomiting)    Sleep apnea 04/2011    does not wear CPAP    Past Surgical History:  Procedure Laterality Date   ABDOMINAL HYSTERECTOMY     COLONOSCOPY     DILATION AND CURETTAGE OF UTERUS  1981   abnormal pap   KNEE ARTHROSCOPY Left 2007   x 2   LUMBAR LAMINECTOMY/DECOMPRESSION MICRODISCECTOMY  05/31/2011   Procedure: LUMBAR LAMINECTOMY/DECOMPRESSION MICRODISCECTOMY;  Surgeon: Johnn Hai;  Location: [facility];  Service: Orthopedics;  Laterality: Left;  Decompression Lumbar four to five and  Lumbar five to Sacral one on Left  (X-Ray)   MASTECTOMY W/ SENTINEL NODE BIOPSY Right 09/16/2018   Procedure: RIGHT MASTECTOMY WITH RIGHT AXILLARY SENTINEL LYMPH NODE BIOPSY;  Surgeon: Alphonsa Overall, MD;  Location: [facility];  Service: General;  Laterality: Right;   PARTIAL HYSTERECTOMY  1982   PORTACATH PLACEMENT Left 10/31/2018   Procedure: INSERTION PORT-A-CATH WITH ULTRASOUND;  Surgeon: Alphonsa Overall, MD;  Location: [facility];  Service: General;  Laterality: Left;   TONSILLECTOMY     as child   TOTAL KNEE ARTHROPLASTY Left 12/18/2005    Family History  Problem Relation Age of Onset   Anesthesia problems Mother    Heart disease Mother        CHF, atrial fib   Heart failure Mother    Anesthesia problems Sister    Colon polyps Father    Heart disease Father        "Fluid around the heart"   Hypertension Sister    Breast cancer Maternal Aunt    Colon cancer Maternal Aunt    Prostate cancer Neg Hx    Ovarian cancer Neg Hx     Social History   Socioeconomic History   Marital status: Married    Spouse name: [name]   Number of children: 2   Years of education: 12   Highest education level: Not on file  Occupational History   Occupation: Disabled  Tobacco Use   Smoking status: Never   Smokeless tobacco: Never  Vaping Use   Vaping Use: Never used  Substance and Sexual Activity   Alcohol use: No   Drug use:  No   Sexual activity: Yes    Birth control/protection: Post-menopausal, Surgical  Other Topics Concern   Not on file  Social History Narrative   Tea daily.  Rarely has caffeine    Social Determinants of Radio broadcast assistant Strain: Not on file  Food Insecurity: Not on file  Transportation Needs: Not on file  Physical Activity: Not on file  Stress: Not on file  Social Connections: Not on file  Intimate Partner Violence: Not on file      Review of systems: All other review of systems negative except as mentioned in the HPI.   Physical Exam: Vitals:   04/19/21 0917  BP: 104/62  Pulse: 68  SpO2: 99%   Body mass index  is 40.17 kg/m. Gen:      No acute distress HEENT:  sclera anicteric Abd:      soft, non-tender; no palpable masses, no distension Ext:    No edema Neuro: alert and oriented x 3 Psych: normal mood and affect  Data Reviewed:  Reviewed labs, radiology imaging, old records and pertinent past GI work up   Assessment and Plan/Recommendations:  70 year old very pleasant female with history of chronic GERD, large hiatal hernia with complaints of abdominal bloating, discomfort and irregular bowel habits  GERD: Use Nexium 40 mg daily, 30 minutes before breakfast Continue antireflux measures and lifestyle modifications  Abdominal bloating associated with irregular bowel habits likely secondary to irritable bowel syndrome predominant constipation: We will use low-dose Linzess 72 mcg daily, advised patient to stop if develops significant diarrhea  Use dicyclomine 10 mg every 8 hours as needed for abdominal cramping  She is past due for colorectal cancer screening, last colonoscopy in 2009.  I recommended colonoscopy.  Patient wants to defer scheduling it until she completes rehab for chronic back pain and her husband is recovering from hernia surgery  Return in 6 to 8 weeks for follow-up or sooner if needed  This visit required 40 minutes of patient  care (this includes precharting, chart review, review of results, face-to-face time used for counseling as well as treatment plan and follow-up. The patient was provided an opportunity to ask questions and all were answered. The patient agreed with the plan and demonstrated an understanding of the instructions.  Damaris Hippo , MD    CC: Hassell Done, [name], *

## 2021-04-19 NOTE — Patient Instructions (Signed)
It has been recommended to you by your physician that you have a(n) colonoscopy  completed. Per your request, we did not schedule the procedure(s) today. Please contact our office at [phone] should you decide to have the procedure completed. You will be scheduled for a pre-visit and procedure at that time. Please inform us directly once you are scheduled so we can get your blood thinner held before. Leave a message for Shirlean Mylar when you are scheduled    We will send Linzess, Dicyclomine to your pharmacy  Continue Nexium  Gastroesophageal Reflux Disease, Adult Gastroesophageal reflux (GER) happens when acid from the stomach flows up into the tube that connects the mouth and the stomach (esophagus). Normally, food travels down the esophagus and stays in the stomach to be digested. However, when a person has GER, food and stomach acid sometimes move back up into the esophagus. If this becomes a more serious problem, the person may be diagnosed with a disease called gastroesophageal reflux disease (GERD). GERD occurs when the reflux: Happens often. Causes frequent or severe symptoms. Causes problems such as damage to the esophagus. When stomach acid comes in contact with the esophagus, the acid may cause inflammation in the esophagus. Over time, GERD may create small holes (ulcers) in the lining of the esophagus. What are the causes? This condition is caused by a problem with the muscle between the esophagus and the stomach (lower esophageal sphincter, or LES). Normally, the LES muscle closes after food passes through the esophagus to the stomach. When the LES is weakened or abnormal, it does not close properly, and that allows food and stomach acid to go back up into the esophagus. The LES can be weakened by certain dietary substances, medicines, and medical conditions, including: Tobacco use. Pregnancy. Having a hiatal hernia. Alcohol use. Certain foods and beverages, such as coffee, chocolate,  onions, and peppermint. What increases the risk? You are more likely to develop this condition if you: Have an increased body weight. Have a connective tissue disorder. Take NSAIDs, such as ibuprofen. What are the signs or symptoms? Symptoms of this condition include: Heartburn. Difficult or painful swallowing and the feeling of having a lump in the throat. A bitter taste in the mouth. Bad breath and having a large amount of saliva. Having an upset or bloated stomach and belching. Chest pain. Different conditions can cause chest pain. Make sure you see your health care provider if you experience chest pain. Shortness of breath or wheezing. Ongoing (chronic) cough or a nighttime cough. Wearing away of tooth enamel. Weight loss. How is this diagnosed? This condition may be diagnosed based on a medical history and a physical exam. To determine if you have mild or severe GERD, your health care provider may also monitor how you respond to treatment. You may also have tests, including: A test to examine your stomach and esophagus with a small camera (endoscopy). A test that measures the acidity level in your esophagus. A test that measures how much pressure is on your esophagus. A barium swallow or modified barium swallow test to show the shape, size, and functioning of your esophagus. How is this treated? Treatment for this condition may vary depending on how severe your symptoms are. Your health care provider may recommend: Changes to your diet. Medicine. Surgery. The goal of treatment is to help relieve your symptoms and to prevent complications. Follow these instructions at home: Eating and drinking  Follow a diet as recommended by your health care provider. This may  involve avoiding foods and drinks such as: Coffee and tea, with or without caffeine. Drinks that contain alcohol. Energy drinks and sports drinks. Carbonated drinks or sodas. Chocolate and cocoa. Peppermint and mint  flavorings. Garlic and onions. Horseradish. Spicy and acidic foods, including peppers, chili powder, curry powder, vinegar, hot sauces, and barbecue sauce. Citrus fruit juices and citrus fruits, such as oranges, lemons, and limes. Tomato-based foods, such as red sauce, chili, salsa, and pizza with red sauce. Fried and fatty foods, such as donuts, french fries, potato chips, and high-fat dressings. High-fat meats, such as hot dogs and fatty cuts of red and white meats, such as rib eye steak, sausage, ham, and bacon. High-fat dairy items, such as whole milk, butter, and cream cheese. Eat small, frequent meals instead of large meals. Avoid drinking large amounts of liquid with your meals. Avoid eating meals during the 2-3 hours before bedtime. Avoid lying down right after you eat. Do not exercise right after you eat. Lifestyle  Do not use any products that contain nicotine or tobacco. These products include cigarettes, chewing tobacco, and vaping devices, such as e-cigarettes. If you need help quitting, ask your health care provider. Try to reduce your stress by using methods such as yoga or meditation. If you need help reducing stress, ask your health care provider. If you are overweight, reduce your weight to an amount that is healthy for you. Ask your health care provider for guidance about a safe weight loss goal. General instructions Pay attention to any changes in your symptoms. Take over-the-counter and prescription medicines only as told by your health care provider. Do not take aspirin, ibuprofen, or other NSAIDs unless your health care provider told you to take these medicines. Wear loose-fitting clothing. Do not wear anything tight around your waist that causes pressure on your abdomen. Raise (elevate) the head of your bed about 6 inches (15 cm). You can use a wedge to do this. Avoid bending over if this makes your symptoms worse. Keep all follow-up visits. This is  important. Contact a health care provider if: You have: New symptoms. Unexplained weight loss. Difficulty swallowing or it hurts to swallow. Wheezing or a persistent cough. A hoarse voice. Your symptoms do not improve with treatment. Get help right away if: You have sudden pain in your arms, neck, jaw, teeth, or back. You suddenly feel sweaty, dizzy, or light-headed. You have chest pain or shortness of breath. You vomit and the vomit is green, yellow, or black, or it looks like blood or coffee grounds. You faint. You have stool that is red, bloody, or black. You cannot swallow, drink, or eat. These symptoms may represent a serious problem that is an emergency. Do not wait to see if the symptoms will go away. Get medical help right away. Call your local emergency services (911 in the [place] Do not drive yourself to the hospital. Summary Gastroesophageal reflux happens when acid from the stomach flows up into the esophagus. GERD is a disease in which the reflux happens often, causes frequent or severe symptoms, or causes problems such as damage to the esophagus. Treatment for this condition may vary depending on how severe your symptoms are. Your health care provider may recommend diet and lifestyle changes, medicine, or surgery. Contact a health care provider if you have new or worsening symptoms. Take over-the-counter and prescription medicines only as told by your health care provider. Do not take aspirin, ibuprofen, or other NSAIDs unless your health care provider told you to  do so. Keep all follow-up visits as told by your health care provider. This is important. This information is not intended to replace advice given to you by your health care provider. Make sure you discuss any questions you have with your health care provider. Document Revised: 01/05/2020 Document Reviewed: 01/05/2020 Elsevier Patient Education  2022 [employer].  Due to recent changes in healthcare laws, you may  see the results of your imaging and laboratory studies on MyChart before your provider has had a chance to review them.  We understand that in some cases there may be results that are confusing or concerning to you. Not all laboratory results come back in the same time frame and the provider may be waiting for multiple results in order to interpret others.  Please give [place] 48 hours in order for your provider to thoroughly review all the results before contacting the office for clarification of your results.    If you are age 100 or older, your body mass index should be between 23-30. Your Body mass index is 40.17 kg/m. If this is out of the aforementioned range listed, please consider follow up with your Primary Care Provider.  If you are age 36 or younger, your body mass index should be between 19-25. Your Body mass index is 40.17 kg/m. If this is out of the aformentioned range listed, please consider follow up with your Primary Care Provider.   __________________________________________________________  The [facility] providers would like to encourage you to use [facility] to communicate with providers for non-urgent requests or questions.  Due to long hold times on the telephone, sending your provider a message by [facility] may be a faster and more efficient way to get a response.  Please allow 48 business hours for a response.  Please remember that this is for non-urgent requests.    I appreciate the  opportunity to care for you  Thank You   Harl Bowie , MD

## 2021-05-01 ENCOUNTER — Other Ambulatory Visit: Payer: Self-pay | Admitting: Cardiology

## 2021-05-01 ENCOUNTER — Other Ambulatory Visit: Payer: Self-pay | Admitting: Oncology

## 2021-05-01 DIAGNOSIS — C50411 Malignant neoplasm of upper-outer quadrant of right female breast: Secondary | ICD-10-CM

## 2021-05-02 ENCOUNTER — Encounter: Payer: Self-pay | Admitting: Oncology

## 2021-05-04 ENCOUNTER — Other Ambulatory Visit: Payer: Self-pay | Admitting: *Deleted

## 2021-05-10 DIAGNOSIS — H52203 Unspecified astigmatism, bilateral: Secondary | ICD-10-CM | POA: Diagnosis not present

## 2021-05-10 DIAGNOSIS — H43393 Other vitreous opacities, bilateral: Secondary | ICD-10-CM | POA: Diagnosis not present

## 2021-05-10 DIAGNOSIS — H5203 Hypermetropia, bilateral: Secondary | ICD-10-CM | POA: Diagnosis not present

## 2021-05-10 DIAGNOSIS — H2513 Age-related nuclear cataract, bilateral: Secondary | ICD-10-CM | POA: Diagnosis not present

## 2021-05-10 DIAGNOSIS — H40003 Preglaucoma, unspecified, bilateral: Secondary | ICD-10-CM | POA: Diagnosis not present

## 2021-05-10 DIAGNOSIS — H16223 Keratoconjunctivitis sicca, not specified as Sjogren's, bilateral: Secondary | ICD-10-CM | POA: Diagnosis not present

## 2021-05-10 DIAGNOSIS — H524 Presbyopia: Secondary | ICD-10-CM | POA: Diagnosis not present

## 2021-05-16 ENCOUNTER — Other Ambulatory Visit: Payer: Self-pay

## 2021-05-16 ENCOUNTER — Encounter: Payer: Self-pay | Admitting: Nurse Practitioner

## 2021-05-16 ENCOUNTER — Ambulatory Visit: Payer: BC Managed Care – PPO | Admitting: Nurse Practitioner

## 2021-05-16 VITALS — BP 103/60 | HR 56 | Temp 98.1°F | Resp 20 | Ht 64.0 in | Wt 234.0 lb

## 2021-05-16 DIAGNOSIS — K219 Gastro-esophageal reflux disease without esophagitis: Secondary | ICD-10-CM | POA: Diagnosis not present

## 2021-05-16 DIAGNOSIS — I480 Paroxysmal atrial fibrillation: Secondary | ICD-10-CM | POA: Diagnosis not present

## 2021-05-16 DIAGNOSIS — K582 Mixed irritable bowel syndrome: Secondary | ICD-10-CM | POA: Diagnosis not present

## 2021-05-16 DIAGNOSIS — F3160 Bipolar disorder, current episode mixed, unspecified: Secondary | ICD-10-CM | POA: Diagnosis not present

## 2021-05-16 DIAGNOSIS — F5102 Adjustment insomnia: Secondary | ICD-10-CM

## 2021-05-16 DIAGNOSIS — F319 Bipolar disorder, unspecified: Secondary | ICD-10-CM

## 2021-05-16 DIAGNOSIS — I700 Atherosclerosis of aorta: Secondary | ICD-10-CM

## 2021-05-16 DIAGNOSIS — M5137 Other intervertebral disc degeneration, lumbosacral region: Secondary | ICD-10-CM | POA: Diagnosis not present

## 2021-05-16 DIAGNOSIS — Z170 Estrogen receptor positive status [ER+]: Secondary | ICD-10-CM | POA: Diagnosis not present

## 2021-05-16 DIAGNOSIS — I10 Essential (primary) hypertension: Secondary | ICD-10-CM

## 2021-05-16 DIAGNOSIS — Z6841 Body Mass Index (BMI) 40.0 and over, adult: Secondary | ICD-10-CM | POA: Diagnosis not present

## 2021-05-16 DIAGNOSIS — C50411 Malignant neoplasm of upper-outer quadrant of right female breast: Secondary | ICD-10-CM

## 2021-05-16 LAB — CBC WITH DIFFERENTIAL/PLATELET
Basophils Absolute: 0.1 10*3/uL (ref 0.0–0.2)
Basos: 1 %
EOS (ABSOLUTE): 0.1 10*3/uL (ref 0.0–0.4)
Eos: 2 %
Hematocrit: 38.2 % (ref 34.0–46.6)
Hemoglobin: 12.2 g/dL (ref 11.1–15.9)
Immature Grans (Abs): 0 10*3/uL (ref 0.0–0.1)
Immature Granulocytes: 0 %
Lymphocytes Absolute: 1 10*3/uL (ref 0.7–3.1)
Lymphs: 13 %
MCH: 27.7 pg (ref 26.6–33.0)
MCHC: 31.9 g/dL (ref 31.5–35.7)
MCV: 87 fL (ref 79–97)
Monocytes Absolute: 0.9 10*3/uL (ref 0.1–0.9)
Monocytes: 12 %
Neutrophils Absolute: 5.1 10*3/uL (ref 1.4–7.0)
Neutrophils: 72 %
Platelets: 199 10*3/uL (ref 150–450)
RBC: 4.4 x10E6/uL (ref 3.77–5.28)
RDW: 14.5 % (ref 11.7–15.4)
WBC: 7.2 10*3/uL (ref 3.4–10.8)

## 2021-05-16 LAB — CMP14+EGFR
ALT: 13 IU/L (ref 0–32)
AST: 13 IU/L (ref 0–40)
Albumin/Globulin Ratio: 1.6 (ref 1.2–2.2)
Albumin: 3.8 g/dL (ref 3.8–4.8)
Alkaline Phosphatase: 144 IU/L — ABNORMAL HIGH (ref 44–121)
BUN/Creatinine Ratio: 15 (ref 12–28)
BUN: 14 mg/dL (ref 8–27)
Bilirubin Total: 0.3 mg/dL (ref 0.0–1.2)
CO2: 27 mmol/L (ref 20–29)
Calcium: 8.9 mg/dL (ref 8.7–10.3)
Chloride: 105 mmol/L (ref 96–106)
Creatinine, Ser: 0.91 mg/dL (ref 0.57–1.00)
Globulin, Total: 2.4 g/dL (ref 1.5–4.5)
Glucose: 96 mg/dL (ref 70–99)
Potassium: 4.3 mmol/L (ref 3.5–5.2)
Sodium: 145 mmol/L — ABNORMAL HIGH (ref 134–144)
Total Protein: 6.2 g/dL (ref 6.0–8.5)
eGFR: 69 mL/min/{1.73_m2} (ref >59)

## 2021-05-16 LAB — LIPID PANEL
Chol/HDL Ratio: 2.8 ratio (ref 0.0–4.4)
Cholesterol, Total: 188 mg/dL (ref 100–199)
HDL: 67 mg/dL (ref >39)
LDL Chol Calc (NIH): 99 mg/dL (ref 0–99)
Triglycerides: 128 mg/dL (ref 0–149)
VLDL Cholesterol Cal: 22 mg/dL (ref 5–40)

## 2021-05-16 MED ORDER — METOPROLOL TARTRATE 25 MG PO TABS: 25.0000 mg | ORAL_TABLET | Freq: Two times a day (BID) | ORAL | 1 refills | Status: DC

## 2021-05-16 MED ORDER — VILAZODONE HCL 40 MG PO TABS: 40.0000 mg | ORAL_TABLET | Freq: Every day | ORAL | 1 refills | Status: DC

## 2021-05-16 MED ORDER — APIXABAN 5 MG PO TABS: 5.0000 mg | ORAL_TABLET | Freq: Two times a day (BID) | ORAL | 1 refills | Status: DC

## 2021-05-16 MED ORDER — GABAPENTIN 300 MG PO CAPS: 300.0000 mg | ORAL_CAPSULE | Freq: Three times a day (TID) | ORAL | 5 refills | Status: DC

## 2021-05-16 MED ORDER — ESOMEPRAZOLE MAGNESIUM 40 MG PO CPDR: 40.0000 mg | DELAYED_RELEASE_CAPSULE | Freq: Every day | ORAL | 1 refills | Status: DC

## 2021-05-16 NOTE — Progress Notes (Signed)
Subjective:    Patient ID: Doris Smith, female    DOB: [birth year], 70 y.o.   MRN: [id number]   Chief Complaint: Medical Management of Chronic Issues    HPI:  1. Essential hypertension No c/o chest pain, sob or headache. Does not check blood pressure at home. BP Readings from Last 3 Encounters:  04/19/21 104/62  11/16/20 121/67  11/11/20 (!) 141/75     2. Paroxysmal atrial fibrillation (HCC) Denies any heart palpitations or heart racing. She is currently on metoprolol and eliquis. Denies any bleeding problems from eliquis.  3. Aortic atherosclerosis ([place]) Last saw cariology om 04/26/20. No changes were made at that time with plan of care. She doe sot have an appointment scheduled  4. Gastroesophageal reflux disease without esophagitis Takes nexium as needed. Does well to keep symptoms under control  5. Irritable bowel syndrome with both constipation and diarrhea Doing well has occasional constipaton and diarrhea, but overall doing well.  6. Bipolar affective disorder, remission status unspecified ([place]) Is on latuda and viibryd. Says she has been doing well even though depression screen has gone up. She is seeing counselor now. She has not been taking ativan in awhile. Depression screen [facility] 2/9 05/16/2021 11/11/2020 06/24/2020  Decreased Interest _0 Down, Depressed, Hopeless _1 PHQ - 2 Score _2 Altered sleeping _3 Tired, decreased energy _4 Change in appetite _5 Feeling bad or failure about yourself  2 0 0  Trouble concentrating 0 0 2  Moving slowly or fidgety/restless 2 0 3  Suicidal thoughts 0 0 0  PHQ-9 Score _6 Difficult doing work/chores Not difficult at all Not difficult at all Very difficult  Some recent data might be hidden     7. Insomnia due to stress Some days she sleeps well and other days not so well  8. Malignant neoplasm of upper-outer quadrant of right breast in female, estrogen receptor positive [facility]) See  oncology yearly. She is still on armidex. Says she is dong well  9. Obesity (BMI 30-39.9) No recent weight chnages Wt Readings from Last 3 Encounters:  05/16/21 234 lb (106.1 kg)  04/19/21 234 lb (106.1 kg)  11/16/20 225 lb 12.8 oz (102.4 kg)   BMI Readings from Last 3 Encounters:  05/16/21 40.17 kg/m  04/19/21 40.17 kg/m  11/16/20 38.76 kg/m   10. DDD Is on gabapentin TID and is doing well. Rates back pain from 3-7/10. To much activity increases pain    Outpatient Encounter Medications as of 05/16/2021  Medication Sig   anastrozole (ARIMIDEX) 1 MG tablet Take 1 tablet by mouth once daily   apixaban (ELIQUIS) 5 MG TABS tablet Take 1 tablet by mouth twice daily   clotrimazole-betamethasone (LOTRISONE) cream APPLY  CREAM TOPICALLY TWICE DAILY   esomeprazole (NEXIUM) 40 MG capsule Take 1 capsule by mouth once daily   gabapentin (NEURONTIN) 300 MG capsule TAKE 1 CAPSULE BY MOUTH THREE TIMES DAILY   ibandronate (BONIVA) 150 MG tablet Take 1 tablet (150 mg total) by mouth every 30 (thirty) days. Take in the morning with a full glass of water, on an empty stomach, and do not take anything else by mouth or lie down for the next 30 min.   loratadine (CLARITIN) 10 MG tablet Take 10 mg by mouth daily.   LORazepam (ATIVAN) 0.5 MG tablet lorazepam 0.5 mg tablet  TAKE 1/2 TO 1 (ONE-HALF TO ONE)  TABLET BY MOUTH ONCE DAILY AS NEEDED FOR ANXIETY   Lurasidone HCl (LATUDA) 60 MG TABS Latuda 60 mg tablet  TAKE 1 TABLET BY MOUTH ONCE DAILY   metoprolol tartrate (LOPRESSOR) 25 MG tablet Take 1 tablet by mouth twice daily   midodrine (PROAMATINE) 2.5 MG tablet TAKE 1 TABLET BY MOUTH THREE TIMES DAILY WITH MEALS   promethazine-dextromethorphan (PROMETHAZINE-DM) 6.25-15 MG/5ML syrup TAKE 5 MILLILITERS BY MOUTH 4 TIMES DAILY AS NEEDED FOR COUGH   triamcinolone (KENALOG) 0.1 % Apply 1 application topically 2 (two) times daily.   Vilazodone HCl (VIIBRYD) 40 MG TABS Take 1 tablet by mouth once daily    [DISCONTINUED] linaclotide (LINZESS) 72 MCG capsule Take 1 capsule (72 mcg total) by mouth daily before breakfast.   [DISCONTINUED] dicyclomine (BENTYL) 10 MG capsule Take 1 capsule (10 mg total) by mouth 4 (four) times daily -  before meals and at bedtime.   Facility-Administered Encounter Medications as of 05/16/2021  Medication   heparin lock flush 100 unit/mL   sodium chloride flush (NS) 0.9 % injection 10 mL    Past Surgical History:  Procedure Laterality Date   ABDOMINAL HYSTERECTOMY     COLONOSCOPY     DILATION AND CURETTAGE OF UTERUS  1981   abnormal pap   KNEE ARTHROSCOPY Left 2007   x 2   LUMBAR LAMINECTOMY/DECOMPRESSION MICRODISCECTOMY  05/31/2011   Procedure: LUMBAR LAMINECTOMY/DECOMPRESSION MICRODISCECTOMY;  Surgeon: Johnn Hai;  Location: [facility];  Service: Orthopedics;  Laterality: Left;  Decompression Lumbar four to five and  Lumbar five to Sacral one on Left  (X-Ray)   MASTECTOMY W/ SENTINEL NODE BIOPSY Right 09/16/2018   Procedure: RIGHT MASTECTOMY WITH RIGHT AXILLARY SENTINEL LYMPH NODE BIOPSY;  Surgeon: Alphonsa Overall, MD;  Location: [place];  Service: General;  Laterality: Right;   PARTIAL HYSTERECTOMY  1982   PORTACATH PLACEMENT Left 10/31/2018   Procedure: INSERTION PORT-A-CATH WITH ULTRASOUND;  Surgeon: Alphonsa Overall, MD;  Location: [place];  Service: General;  Laterality: Left;   TONSILLECTOMY     as child   TOTAL KNEE ARTHROPLASTY Left 12/18/2005    Family History  Problem Relation Age of Onset   Anesthesia problems Mother    Heart disease Mother        CHF, atrial fib   Heart failure Mother    Anesthesia problems Sister    Colon polyps Father    Heart disease Father        "Fluid around the heart"   Hypertension Sister    Breast cancer Maternal Aunt    Colon cancer Maternal Aunt    Prostate cancer Neg Hx    Ovarian cancer Neg Hx     New complaints: None today  Social history: Lives with her husband  Controlled substance  contract: n/a     Review of Systems  Constitutional:  Negative for diaphoresis.  Eyes:  Negative for pain.  Respiratory:  Negative for shortness of breath.   Cardiovascular:  Negative for chest pain, palpitations and leg swelling.  Gastrointestinal:  Negative for abdominal pain.  Endocrine: Negative for polydipsia.  Skin:  Negative for rash.  Neurological:  Negative for dizziness, weakness and headaches.  Hematological:  Does not bruise/bleed easily.  All other systems reviewed and are negative.     Objective:   Physical Exam Vitals and nursing note reviewed.  Constitutional:      General: She is not in acute distress.    Appearance: Normal appearance. She is well-developed.  HENT:  Head: Normocephalic.     Right Ear: Tympanic membrane normal.     Left Ear: Tympanic membrane normal.     Nose: Nose normal.     Mouth/Throat:     Mouth: Mucous membranes are moist.  Eyes:     Pupils: Pupils are equal, round, and reactive to light.  Neck:     Vascular: No carotid bruit or JVD.  Cardiovascular:     Rate and Rhythm: Normal rate and regular rhythm.     Heart sounds: Normal heart sounds.  Pulmonary:     Effort: Pulmonary effort is normal. No respiratory distress.     Breath sounds: Normal breath sounds. No wheezing or rales.  Chest:     Chest wall: No tenderness.  Breasts:    Right: Absent.     Left: Normal.  Abdominal:     General: Bowel sounds are normal. There is no distension or abdominal bruit.     Palpations: Abdomen is soft. There is no hepatomegaly, splenomegaly, mass or pulsatile mass.     Tenderness: There is no abdominal tenderness.  Musculoskeletal:        General: Normal range of motion.     Cervical back: Normal range of motion and neck supple.     Comments: [place]king with cane  Lymphadenopathy:     Cervical: No cervical adenopathy.  Skin:    General: Skin is warm and dry.  Neurological:     Mental Status: She is alert and oriented to person, place,  and time.     Deep Tendon Reflexes: Reflexes are normal and symmetric.  Psychiatric:        Behavior: Behavior normal.        Thought Content: Thought content normal.        Judgment: Judgment normal.    BP 103/60   Pulse (!) 56   Temp 98.1 F (36.7 C) (Temporal)   Resp 20   Ht 5' 4" (1.626 m)   Wt 234 lb (106.1 kg)   SpO2 99%   BMI 40.17 kg/m        Assessment & Plan:  Doris Smith comes in today with chief complaint of Medical Management of Chronic Issues (Wants to be checked for diabetes/)   Diagnosis and orders addressed:  1. Essential hypertension Low sodium diet - CBC with Differential/Platelet - CMP14+EGFR - Lipid panel  2. Paroxysmal atrial fibrillation (HCC) Avoid caffeine Report heart racing - metoprolol tartrate (LOPRESSOR) 25 MG tablet; Take 1 tablet (25 mg total) by mouth 2 (two) times daily.  Dispense: 180 tablet; Refill: 1 - apixaban (ELIQUIS) 5 MG TABS tablet; Take 1 tablet (5 mg total) by mouth 2 (two) times daily.  Dispense: 180 tablet; Refill: 1  3. Aortic atherosclerosis ([place]) Keep follow up with cardiology  4. Gastroesophageal reflux disease without esophagitis Avoid spicy foods Do not eat 2 hours prior to bedtime  - esomeprazole (NEXIUM) 40 MG capsule; Take 1 capsule (40 mg total) by mouth daily.  Dispense: 90 capsule; Refill: 1  5. Irritable bowel syndrome with both constipation and diarrhea Watch diet to help prevent flare up  6. Bipolar affective disorder, remission status unspecified ([place]) Continue counseling  7. Insomnia due to stress Bedtime routine  8. Malignant neoplasm of upper-outer quadrant of right breast in female, estrogen receptor positive ([place]) Keep follow up with pncology  9. BMI 40.0-44.9, adult ([place]) Discussed diet and exercise for person with BMI >25 Will recheck weight in 3-6 months    10. Bipolar  affective disorder, current episode mixed, current episode severity unspecified ([place]) Keep follow up with  connseling - Vilazodone HCl (VIIBRYD) 40 MG TABS; Take 1 tablet (40 mg total) by mouth daily.  Dispense: 90 tablet; Refill: 1  11. DEGENERATIVE DISC DISEASE, LUMBOSACRAL SPINE Moist heat  rest - gabapentin (NEURONTIN) 300 MG capsule; Take 1 capsule (300 mg total) by mouth 3 (three) times daily.  Dispense: 90 capsule; Refill: 5   Labs pending Health Maintenance reviewed Diet and exercise encouraged  Follow up plan: 6 months   [name] Done, FNP
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
## 2021-05-16 NOTE — Patient Instructions (Signed)
Diet for Irritable Bowel Syndrome When you have irritable bowel syndrome (IBS), it is very important to eat the foods and follow the eating habits that are best for your condition. IBS may cause various symptoms such as pain in the abdomen, constipation, or diarrhea. Choosing the right foods can help to ease the discomfort from these symptoms. Work with your health care provider and diet and nutrition specialist (dietitian) to find the eating plan that will help to control your symptoms. What are tips for following this plan?   Keep a food diary. This will help you identify foods that cause symptoms. Write down: What you eat and when you eat it. What symptoms you have. When symptoms occur in relation to your meals, such as "pain in abdomen 2 hours after dinner." Eat your meals slowly and in a relaxed setting. Aim to eat 5-6 small meals per day. Do not skip meals. Drink enough fluid to keep your urine pale yellow. Ask your health care provider if you should take an over-the-counter probiotic to help restore healthy bacteria in your gut (digestive tract). Probiotics are foods that contain good bacteria and yeasts. Your dietitian may have specific dietary recommendations for you based on your symptoms. He or she may recommend that you: Avoid foods that cause symptoms. Talk with your dietitian about other ways to get the same nutrients that are in those problem foods. Avoid foods with gluten. Gluten is a protein that is found in rye, wheat, and barley. Eat more foods that contain soluble fiber. Examples of foods with high soluble fiber include oats, seeds, and certain fruits and vegetables. Take a fiber supplement if directed by your dietitian. Reduce or avoid certain foods called FODMAPs. These are foods that contain carbohydrates that are hard to digest. Ask your doctor which foods contain these carbohydrates. What foods are not recommended? The following are some foods and drinks that may make your  symptoms worse: Fatty foods, such as french fries. Foods that contain gluten, such as pasta and cereal. Dairy products, such as milk, cheese, and ice cream. Chocolate. Alcohol. Products with caffeine, such as coffee. Carbonated drinks, such as soda. Foods that are high in FODMAPs. These include certain fruits and vegetables. Products with sweeteners such as honey, high fructose corn syrup, sorbitol, and mannitol. The items listed above may not be a complete list of foods and beverages you should avoid. Contact a dietitian for more information. What foods are good sources of fiber? Your health care provider or dietitian may recommend that you eat more foods that contain fiber. Fiber can help to reduce constipation and other IBS symptoms. Add foods with fiber to your diet a little at a time so your body can get used to them. Too much fiber at one time might cause gas and swelling of your abdomen. The following are some foods that are good sources of fiber: Berries, such as raspberries, strawberries, and blueberries. Tomatoes. Carrots. Brown rice. Oats. Seeds, such as chia and pumpkin seeds. The items listed above may not be a complete list of recommended sources of fiber. Contact your dietitian for more options. Where to find more information International Foundation for Functional Gastrointestinal Disorders: www.iffgd.[employer] of Diabetes and Digestive and Kidney Diseases: [employer] Summary When you have irritable bowel syndrome (IBS), it is very important to eat the foods and follow the eating habits that are best for your condition. IBS may cause various symptoms such as pain in the abdomen, constipation, or diarrhea. Choosing the right  foods can help to ease the discomfort that comes from symptoms. Keep a food diary. This will help you identify foods that cause symptoms. Your health care provider or diet and nutrition specialist (dietitian) may recommend that you  eat more foods that contain fiber. This information is not intended to replace advice given to you by your health care provider. Make sure you discuss any questions you have with your health care provider. Document Revised: 02/19/2020 Document Reviewed: 02/26/2020 Elsevier Patient Education  2022 [employer].

## 2021-05-17 ENCOUNTER — Inpatient Hospital Stay: Payer: BC Managed Care – PPO | Attending: Oncology

## 2021-05-17 ENCOUNTER — Inpatient Hospital Stay (HOSPITAL_BASED_OUTPATIENT_CLINIC_OR_DEPARTMENT_OTHER): Payer: BC Managed Care – PPO | Admitting: Oncology

## 2021-05-17 VITALS — BP 128/64 | HR 63 | Temp 98.2°F | Resp 18 | Ht 64.0 in | Wt 234.3 lb

## 2021-05-17 DIAGNOSIS — I480 Paroxysmal atrial fibrillation: Secondary | ICD-10-CM | POA: Diagnosis not present

## 2021-05-17 DIAGNOSIS — Z7901 Long term (current) use of anticoagulants: Secondary | ICD-10-CM | POA: Diagnosis not present

## 2021-05-17 DIAGNOSIS — Z170 Estrogen receptor positive status [ER+]: Secondary | ICD-10-CM | POA: Insufficient documentation

## 2021-05-17 DIAGNOSIS — Z803 Family history of malignant neoplasm of breast: Secondary | ICD-10-CM | POA: Diagnosis not present

## 2021-05-17 DIAGNOSIS — K219 Gastro-esophageal reflux disease without esophagitis: Secondary | ICD-10-CM | POA: Insufficient documentation

## 2021-05-17 DIAGNOSIS — R232 Flushing: Secondary | ICD-10-CM | POA: Insufficient documentation

## 2021-05-17 DIAGNOSIS — Z885 Allergy status to narcotic agent status: Secondary | ICD-10-CM | POA: Insufficient documentation

## 2021-05-17 DIAGNOSIS — N644 Mastodynia: Secondary | ICD-10-CM | POA: Insufficient documentation

## 2021-05-17 DIAGNOSIS — C50411 Malignant neoplasm of upper-outer quadrant of right female breast: Secondary | ICD-10-CM

## 2021-05-17 DIAGNOSIS — Z8249 Family history of ischemic heart disease and other diseases of the circulatory system: Secondary | ICD-10-CM | POA: Diagnosis not present

## 2021-05-17 DIAGNOSIS — Z79899 Other long term (current) drug therapy: Secondary | ICD-10-CM | POA: Insufficient documentation

## 2021-05-17 DIAGNOSIS — Z882 Allergy status to sulfonamides status: Secondary | ICD-10-CM | POA: Insufficient documentation

## 2021-05-17 DIAGNOSIS — F319 Bipolar disorder, unspecified: Secondary | ICD-10-CM | POA: Insufficient documentation

## 2021-05-17 DIAGNOSIS — Z79811 Long term (current) use of aromatase inhibitors: Secondary | ICD-10-CM | POA: Diagnosis not present

## 2021-05-17 DIAGNOSIS — Z8371 Family history of colonic polyps: Secondary | ICD-10-CM | POA: Insufficient documentation

## 2021-05-17 DIAGNOSIS — K589 Irritable bowel syndrome without diarrhea: Secondary | ICD-10-CM | POA: Insufficient documentation

## 2021-05-17 DIAGNOSIS — Z888 Allergy status to other drugs, medicaments and biological substances status: Secondary | ICD-10-CM | POA: Diagnosis not present

## 2021-05-17 DIAGNOSIS — C50811 Malignant neoplasm of overlapping sites of right female breast: Secondary | ICD-10-CM | POA: Insufficient documentation

## 2021-05-17 DIAGNOSIS — Z800 Family history of malignant neoplasm of digestive organs: Secondary | ICD-10-CM | POA: Insufficient documentation

## 2021-05-17 LAB — CBC WITH DIFFERENTIAL/PLATELET
Abs Immature Granulocytes: 0.02 10*3/uL (ref 0.00–0.07)
Basophils Absolute: 0.1 10*3/uL (ref 0.0–0.1)
Basophils Relative: 1 %
Eosinophils Absolute: 0.1 10*3/uL (ref 0.0–0.5)
Eosinophils Relative: 2 %
HCT: 37.2 % (ref 36.0–46.0)
Hemoglobin: 11.9 g/dL — ABNORMAL LOW (ref 12.0–15.0)
Immature Granulocytes: 0 %
Lymphocytes Relative: 13 %
Lymphs Abs: 0.9 10*3/uL (ref 0.7–4.0)
MCH: 27.8 pg (ref 26.0–34.0)
MCHC: 32 g/dL (ref 30.0–36.0)
MCV: 86.9 fL (ref 80.0–100.0)
Monocytes Absolute: 0.6 10*3/uL (ref 0.1–1.0)
Monocytes Relative: 9 %
Neutro Abs: 5.1 10*3/uL (ref 1.7–7.7)
Neutrophils Relative %: 75 %
Platelets: 246 10*3/uL (ref 150–400)
RBC: 4.28 MIL/uL (ref 3.87–5.11)
RDW: 14.7 % (ref 11.5–15.5)
WBC: 6.9 10*3/uL (ref 4.0–10.5)
nRBC: 0 % (ref 0.0–0.2)

## 2021-05-17 LAB — COMPREHENSIVE METABOLIC PANEL
ALT: 12 U/L (ref 0–44)
AST: 13 U/L — ABNORMAL LOW (ref 15–41)
Albumin: 3.4 g/dL — ABNORMAL LOW (ref 3.5–5.0)
Alkaline Phosphatase: 135 U/L — ABNORMAL HIGH (ref 38–126)
Anion gap: 8 (ref 5–15)
BUN: 10 mg/dL (ref 8–23)
CO2: 27 mmol/L (ref 22–32)
Calcium: 8.8 mg/dL — ABNORMAL LOW (ref 8.9–10.3)
Chloride: 107 mmol/L (ref 98–111)
Creatinine, Ser: 0.92 mg/dL (ref 0.44–1.00)
GFR, Estimated: >60 mL/min (ref >60)
Glucose, Bld: 141 mg/dL — ABNORMAL HIGH (ref 70–99)
Potassium: 4 mmol/L (ref 3.5–5.1)
Sodium: 142 mmol/L (ref 135–145)
Total Bilirubin: 0.5 mg/dL (ref 0.3–1.2)
Total Protein: 7 g/dL (ref 6.5–8.1)

## 2021-05-17 MED ORDER — ANASTROZOLE 1 MG PO TABS: 1.0000 mg | ORAL_TABLET | Freq: Every day | ORAL | 4 refills | Status: DC

## 2021-05-30 ENCOUNTER — Encounter: Payer: Self-pay | Admitting: Nurse Practitioner

## 2021-05-30 ENCOUNTER — Ambulatory Visit (INDEPENDENT_AMBULATORY_CARE_PROVIDER_SITE_OTHER): Payer: BC Managed Care – PPO | Admitting: Nurse Practitioner

## 2021-05-30 DIAGNOSIS — R519 Headache, unspecified: Secondary | ICD-10-CM

## 2021-05-30 DIAGNOSIS — J069 Acute upper respiratory infection, unspecified: Secondary | ICD-10-CM | POA: Diagnosis not present

## 2021-05-30 MED ORDER — FLUTICASONE PROPIONATE 50 MCG/ACT NA SUSP: 2.0000 | Freq: Every day | NASAL | 6 refills | Status: DC

## 2021-05-30 MED ORDER — BENZONATATE 100 MG PO CAPS: 100.0000 mg | ORAL_CAPSULE | Freq: Three times a day (TID) | ORAL | 0 refills | Status: DC | PRN

## 2021-05-30 MED ORDER — PROMETHAZINE-DM 6.25-15 MG/5ML PO SYRP: 5.0000 mL | ORAL_SOLUTION | Freq: Four times a day (QID) | ORAL | 0 refills | Status: DC | PRN

## 2021-05-30 NOTE — Progress Notes (Signed)
Virtual Visit  Note Due to COVID-19 pandemic this visit was conducted virtually. This visit type was conducted due to national recommendations for restrictions regarding the COVID-19 Pandemic (e.g. social distancing, sheltering in place) in an effort to limit this patient's exposure and mitigate transmission in our community. All issues noted in this document were discussed and addressed.  A physical exam was not performed with this format.  I connected with Doris Smith on 05/30/21 at 2:35 by telephone and verified that I am speaking with the correct person using two identifiers. Doris Smith is currently located at home and no ne is currently with her during visit. The provider, [name] Done, FNP is located in their office at time of visit.  I discussed the limitations, risks, security and privacy concerns of performing an evaluation and management service by telephone and the availability of in person appointments. I also discussed with the patient that there may be a patient responsible charge related to this service. The patient expressed understanding and agreed to proceed.   History and Present Illness:  Patient calls in c/o sinus headache. She has had congestion for several days. She gets headaches often, has bad headache right now. Occasional cough.     Review of Systems  Constitutional:  Negative for chills, fever and malaise/fatigue.  HENT:  Positive for congestion. Negative for sore throat.   Respiratory:  Positive for cough. Negative for sputum production and shortness of breath.   Neurological:  Positive for headaches. Negative for dizziness.    Observations/Objective: Alert and oriented- answers all questions appropriately No distress Voice hoarse Dry cough   Assessment and Plan: Doris Smith in today with chief complaint of No chief complaint on file.   1. URI with cough and congestion 2. Acute nonintractable headache, unspecified  headache type Force fluids Rest Meds as prescribed  Meds ordered this encounter  Medications   promethazine-dextromethorphan (PROMETHAZINE-DM) 6.25-15 MG/5ML syrup    Sig: Take 5 mLs by mouth 4 (four) times daily as needed for cough.    Dispense:  118 mL    Refill:  0    Order Specific Question:   Supervising Provider    Answer:   Caryl Pina A [[id number]]   benzonatate (TESSALON PERLES) 100 MG capsule    Sig: Take 1 capsule (100 mg total) by mouth 3 (three) times daily as needed.    Dispense:  20 capsule    Refill:  0    Order Specific Question:   Supervising Provider    Answer:   Caryl Pina A [[id number]]   fluticasone (FLONASE) 50 MCG/ACT nasal spray    Sig: Place 2 sprays into both nostrils daily.    Dispense:  16 g    Refill:  6    Order Specific Question:   Supervising Provider    Answer:   Caryl Pina A [[id number]]      Follow Up Instructions: prn    I discussed the assessment and treatment plan with the patient. The patient was provided an opportunity to ask questions and all were answered. The patient agreed with the plan and demonstrated an understanding of the instructions.   The patient was advised to call back or seek an in-person evaluation if the symptoms worsen or if the condition fails to improve as anticipated.  The above assessment and management plan was discussed with the patient. The patient verbalized understanding of and has agreed to the management plan. Patient is aware to call the clinic if  symptoms persist or worsen. Patient is aware when to return to the clinic for a follow-up visit. Patient educated on when it is appropriate to go to the emergency department.   Time call ended:  2:47  I provided 12 minutes of  non face-to-face time during this encounter.    [name] Done, FNP

## 2021-06-20 DIAGNOSIS — F312 Bipolar disorder, current episode manic severe with psychotic features: Secondary | ICD-10-CM | POA: Diagnosis not present

## 2021-06-20 DIAGNOSIS — F410 Panic disorder [episodic paroxysmal anxiety] without agoraphobia: Secondary | ICD-10-CM | POA: Diagnosis not present

## 2021-06-20 DIAGNOSIS — F4310 Post-traumatic stress disorder, unspecified: Secondary | ICD-10-CM | POA: Diagnosis not present

## 2021-06-21 DIAGNOSIS — F4310 Post-traumatic stress disorder, unspecified: Secondary | ICD-10-CM | POA: Diagnosis not present

## 2021-06-21 DIAGNOSIS — F312 Bipolar disorder, current episode manic severe with psychotic features: Secondary | ICD-10-CM | POA: Diagnosis not present

## 2021-06-21 DIAGNOSIS — F410 Panic disorder [episodic paroxysmal anxiety] without agoraphobia: Secondary | ICD-10-CM | POA: Diagnosis not present

## 2021-06-23 ENCOUNTER — Ambulatory Visit: Payer: BC Managed Care – PPO | Admitting: Gastroenterology

## 2021-07-05 ENCOUNTER — Other Ambulatory Visit: Payer: Self-pay | Admitting: Cardiology

## 2021-07-15 DIAGNOSIS — M5136 Other intervertebral disc degeneration, lumbar region: Secondary | ICD-10-CM | POA: Diagnosis not present

## 2021-07-15 DIAGNOSIS — M5451 Vertebrogenic low back pain: Secondary | ICD-10-CM | POA: Diagnosis not present

## 2021-07-18 DIAGNOSIS — F410 Panic disorder [episodic paroxysmal anxiety] without agoraphobia: Secondary | ICD-10-CM | POA: Diagnosis not present

## 2021-07-18 DIAGNOSIS — F312 Bipolar disorder, current episode manic severe with psychotic features: Secondary | ICD-10-CM | POA: Diagnosis not present

## 2021-07-18 DIAGNOSIS — F4310 Post-traumatic stress disorder, unspecified: Secondary | ICD-10-CM | POA: Diagnosis not present

## 2021-07-19 DIAGNOSIS — F312 Bipolar disorder, current episode manic severe with psychotic features: Secondary | ICD-10-CM | POA: Diagnosis not present

## 2021-07-19 DIAGNOSIS — F4310 Post-traumatic stress disorder, unspecified: Secondary | ICD-10-CM | POA: Diagnosis not present

## 2021-07-19 DIAGNOSIS — F410 Panic disorder [episodic paroxysmal anxiety] without agoraphobia: Secondary | ICD-10-CM | POA: Diagnosis not present

## 2021-08-02 ENCOUNTER — Other Ambulatory Visit: Payer: Self-pay | Admitting: Cardiology

## 2021-08-04 DIAGNOSIS — M13861 Other specified arthritis, right knee: Secondary | ICD-10-CM | POA: Diagnosis not present

## 2021-08-05 DIAGNOSIS — M5451 Vertebrogenic low back pain: Secondary | ICD-10-CM | POA: Diagnosis not present

## 2021-08-10 DIAGNOSIS — M5451 Vertebrogenic low back pain: Secondary | ICD-10-CM | POA: Diagnosis not present

## 2021-08-15 DIAGNOSIS — F4310 Post-traumatic stress disorder, unspecified: Secondary | ICD-10-CM | POA: Diagnosis not present

## 2021-08-15 DIAGNOSIS — F312 Bipolar disorder, current episode manic severe with psychotic features: Secondary | ICD-10-CM | POA: Diagnosis not present

## 2021-08-15 DIAGNOSIS — F410 Panic disorder [episodic paroxysmal anxiety] without agoraphobia: Secondary | ICD-10-CM | POA: Diagnosis not present

## 2021-08-17 DIAGNOSIS — M5451 Vertebrogenic low back pain: Secondary | ICD-10-CM | POA: Diagnosis not present

## 2021-08-18 DIAGNOSIS — F4310 Post-traumatic stress disorder, unspecified: Secondary | ICD-10-CM | POA: Diagnosis not present

## 2021-08-18 DIAGNOSIS — F410 Panic disorder [episodic paroxysmal anxiety] without agoraphobia: Secondary | ICD-10-CM | POA: Diagnosis not present

## 2021-08-18 DIAGNOSIS — F312 Bipolar disorder, current episode manic severe with psychotic features: Secondary | ICD-10-CM | POA: Diagnosis not present

## 2021-08-19 DIAGNOSIS — M5451 Vertebrogenic low back pain: Secondary | ICD-10-CM | POA: Diagnosis not present

## 2021-08-23 DIAGNOSIS — M5451 Vertebrogenic low back pain: Secondary | ICD-10-CM | POA: Diagnosis not present

## 2021-08-25 DIAGNOSIS — M5451 Vertebrogenic low back pain: Secondary | ICD-10-CM | POA: Diagnosis not present

## 2021-08-30 DIAGNOSIS — M5451 Vertebrogenic low back pain: Secondary | ICD-10-CM | POA: Diagnosis not present

## 2021-09-01 DIAGNOSIS — M5451 Vertebrogenic low back pain: Secondary | ICD-10-CM | POA: Diagnosis not present

## 2021-09-06 DIAGNOSIS — M5451 Vertebrogenic low back pain: Secondary | ICD-10-CM | POA: Diagnosis not present

## 2021-09-08 DIAGNOSIS — M5451 Vertebrogenic low back pain: Secondary | ICD-10-CM | POA: Diagnosis not present

## 2021-09-13 DIAGNOSIS — M5451 Vertebrogenic low back pain: Secondary | ICD-10-CM | POA: Diagnosis not present

## 2021-09-20 DIAGNOSIS — M5451 Vertebrogenic low back pain: Secondary | ICD-10-CM | POA: Diagnosis not present

## 2021-09-30 DIAGNOSIS — M5451 Vertebrogenic low back pain: Secondary | ICD-10-CM | POA: Diagnosis not present

## 2021-10-03 ENCOUNTER — Encounter: Payer: Self-pay | Admitting: Nurse Practitioner

## 2021-10-03 ENCOUNTER — Ambulatory Visit (INDEPENDENT_AMBULATORY_CARE_PROVIDER_SITE_OTHER): Payer: BC Managed Care – PPO | Admitting: Nurse Practitioner

## 2021-10-03 VITALS — BP 118/77 | HR 61 | Temp 98.0°F | Resp 20 | Ht 64.0 in | Wt 233.0 lb

## 2021-10-03 DIAGNOSIS — G2581 Restless legs syndrome: Secondary | ICD-10-CM

## 2021-10-03 MED ORDER — PRAMIPEXOLE DIHYDROCHLORIDE 0.25 MG PO TABS: 0.2500 mg | ORAL_TABLET | Freq: Every evening | ORAL | 1 refills | Status: DC | PRN

## 2021-10-03 NOTE — Progress Notes (Signed)
? ?  Subjective:  ? ? Patient ID: Doris Smith, female    DOB: 03-25-1951, 71 y.o.   MRN: [id number] ? ?Chief Complaint: Legs moving while sleeping and painful ? ? ?HPI ?Patient comes in today c/o restless leg syndrome. She says when she goes to bed at night her right leg will start jumping. This has been keeping her up at night. She has noticed that even when she is sitting and watching tV her right leg will start shaking. ? ? ? ?Review of Systems  ?Constitutional:  Negative for diaphoresis.  ?Eyes:  Negative for pain.  ?Respiratory:  Negative for shortness of breath.   ?Cardiovascular:  Negative for chest pain, palpitations and leg swelling.  ?Gastrointestinal:  Negative for abdominal pain.  ?Endocrine: Negative for polydipsia.  ?Skin:  Negative for rash.  ?Neurological:  Negative for dizziness, weakness and headaches.  ?Hematological:  Does not bruise/bleed easily.  ?All other systems reviewed and are negative. ? ?   ?Objective:  ? Physical Exam ?Constitutional:   ?   Appearance: Normal appearance. She is obese.  ?Cardiovascular:  ?   Rate and Rhythm: Normal rate and regular rhythm.  ?   Heart sounds: Normal heart sounds.  ?Pulmonary:  ?   Effort: Pulmonary effort is normal.  ?   Breath sounds: Normal breath sounds.  ?Musculoskeletal:  ?   Comments: Patient unable to sit without moving her legs.   ?Skin: ?   General: Skin is warm.  ?Neurological:  ?   Mental Status: She is alert and oriented to person, place, and time.  ?Psychiatric:     ?   Mood and Affect: Mood normal.     ?   Behavior: Behavior normal.  ? ? ?BP 118/77   Pulse 61   Temp 98 ?F (36.7 ?C) (Temporal)   Resp 20   Ht '5\' 4"'$  (1.626 m)   Wt 233 lb (105.7 kg)   SpO2 98%   BMI 39.99 kg/m?  ? ? ? ?   ?Assessment & Plan:  ?Doris Smith in today with chief complaint of Legs moving while sleeping and painful ? ? ?1. Restless leg syndrome ?Keep legs warm at night ?- pramipexole (MIRAPEX) 0.25 MG tablet; Take 1 tablet (0.25 mg total) by mouth at  bedtime as needed.  Dispense: 90 tablet; Refill: 1 ? ? ? ?The above assessment and management plan was discussed with the patient. The patient verbalized understanding of and has agreed to the management plan. Patient is aware to call the clinic if symptoms persist or worsen. Patient is aware when to return to the clinic for a follow-up visit. Patient educated on when it is appropriate to go to the emergency department.  ? ?[name] Done, FNP ? ? ? ?

## 2021-10-03 NOTE — Patient Instructions (Signed)
Restless Legs Syndrome ?Restless legs syndrome is a condition that causes uncomfortable feelings or sensations in the legs, especially while sitting or lying down. The sensations usually cause an overwhelming urge to move the legs. The arms can also sometimes be affected. ?The condition can range from mild to severe. The symptoms often interfere with a person's ability to sleep. ?What are the causes? ?The cause of this condition is not known. ?What increases the risk? ?The following factors may make you more likely to develop this condition: ?Being older than 50. ?Pregnancy. ?Being a woman. In general, the condition is more common in women than in men. ?A family history of the condition. ?Having iron deficiency. ?Overuse of caffeine, nicotine, or alcohol. ?Certain medical conditions, such as kidney disease, Parkinson's disease, or nerve damage. ?Certain medicines, such as those for high blood pressure, nausea, colds, allergies, depression, and some heart conditions. ?What are the signs or symptoms? ?The main symptom of this condition is uncomfortable sensations in the legs, such as: ?Pulling. ?Tingling. ?Prickling. ?Throbbing. ?Crawling. ?Burning. ?Usually, the sensations: ?Affect both sides of the body. ?Are worse when you sit or lie down. ?Are worse at night. These may make it difficult to fall asleep. ?Make you have a strong urge to move your legs. ?Are temporarily relieved by moving your legs or standing. ?The arms can also be affected, but this is rare. People who have this condition often have tiredness during the day because of their lack of sleep at night. ?How is this diagnosed? ?This condition may be diagnosed based on: ?Your symptoms. ?Blood tests. ?In some cases, you may be monitored in a sleep lab by a specialist (a sleep study). This can detect any disruptions in your sleep. ?How is this treated? ?This condition is treated by managing the symptoms. This may include: ?Lifestyle changes, such as  exercising, using relaxation techniques, and avoiding caffeine, alcohol, or tobacco. ?Iron supplements. ?Medicines. Parkinson's medications may be tried first. Anti-seizure medications can also be helpful. ?Follow these instructions at home: ?General instructions ?Take over-the-counter and prescription medicines only as told by your health care provider. ?Use methods to help relieve the uncomfortable sensations, such as: ?Massaging your legs. ?Walking or stretching. ?Taking a cold or hot bath. ?Keep all follow-up visits. This is important. ?Lifestyle ?  ?Practice good sleep habits. For example, go to bed and get up at the same time every day. Most adults should get 7-9 hours of sleep each night. ?Exercise regularly. Try to get at least 30 minutes of exercise most days of the week. ?Practice ways of relaxing, such as yoga or meditation. ?Avoid caffeine and alcohol. ?Do not use any products that contain nicotine or tobacco. These products include cigarettes, chewing tobacco, and vaping devices, such as e-cigarettes. If you need help quitting, ask your health care provider. ?Where to find more information ?National Institute of Neurological Disorders and Stroke: www.ninds.nih.gov ?Contact a health care provider if: ?Your symptoms get worse or they do not improve with treatment. ?Summary ?Restless legs syndrome is a condition that causes uncomfortable feelings or sensations in the legs, especially while sitting or lying down. ?The symptoms often interfere with your ability to sleep. ?This condition is treated by managing the symptoms. You may need to make lifestyle changes or take medicines. ?This information is not intended to replace advice given to you by your health care provider. Make sure you discuss any questions you have with your health care provider. ?Document Revised: 02/06/2021 Document Reviewed: 02/06/2021 ?Elsevier Patient Education ? 2022   Elsevier Inc. ? ?

## 2021-10-04 ENCOUNTER — Other Ambulatory Visit: Payer: Self-pay | Admitting: Cardiology

## 2021-10-11 DIAGNOSIS — M5451 Vertebrogenic low back pain: Secondary | ICD-10-CM | POA: Diagnosis not present

## 2021-10-12 ENCOUNTER — Telehealth: Payer: Self-pay | Admitting: *Deleted

## 2021-10-12 NOTE — Telephone Encounter (Signed)
Drug is covered by current benefit plan. No further PA activity needed

## 2021-10-18 DIAGNOSIS — F410 Panic disorder [episodic paroxysmal anxiety] without agoraphobia: Secondary | ICD-10-CM | POA: Diagnosis not present

## 2021-10-18 DIAGNOSIS — F4310 Post-traumatic stress disorder, unspecified: Secondary | ICD-10-CM | POA: Diagnosis not present

## 2021-10-18 DIAGNOSIS — F312 Bipolar disorder, current episode manic severe with psychotic features: Secondary | ICD-10-CM | POA: Diagnosis not present

## 2021-11-14 ENCOUNTER — Encounter: Payer: Self-pay | Admitting: Nurse Practitioner

## 2021-11-14 ENCOUNTER — Ambulatory Visit (INDEPENDENT_AMBULATORY_CARE_PROVIDER_SITE_OTHER): Payer: BC Managed Care – PPO | Admitting: Nurse Practitioner

## 2021-11-14 VITALS — BP 119/75 | HR 57 | Temp 97.9°F | Resp 20 | Ht 64.0 in | Wt 236.0 lb

## 2021-11-14 DIAGNOSIS — G2581 Restless legs syndrome: Secondary | ICD-10-CM | POA: Diagnosis not present

## 2021-11-14 DIAGNOSIS — K582 Mixed irritable bowel syndrome: Secondary | ICD-10-CM

## 2021-11-14 DIAGNOSIS — M5137 Other intervertebral disc degeneration, lumbosacral region: Secondary | ICD-10-CM

## 2021-11-14 DIAGNOSIS — F3160 Bipolar disorder, current episode mixed, unspecified: Secondary | ICD-10-CM | POA: Diagnosis not present

## 2021-11-14 DIAGNOSIS — E669 Obesity, unspecified: Secondary | ICD-10-CM

## 2021-11-14 DIAGNOSIS — F5102 Adjustment insomnia: Secondary | ICD-10-CM

## 2021-11-14 DIAGNOSIS — I700 Atherosclerosis of aorta: Secondary | ICD-10-CM

## 2021-11-14 DIAGNOSIS — I480 Paroxysmal atrial fibrillation: Secondary | ICD-10-CM

## 2021-11-14 DIAGNOSIS — F418 Other specified anxiety disorders: Secondary | ICD-10-CM | POA: Diagnosis not present

## 2021-11-14 DIAGNOSIS — M51379 Other intervertebral disc degeneration, lumbosacral region without mention of lumbar back pain or lower extremity pain: Secondary | ICD-10-CM

## 2021-11-14 DIAGNOSIS — I10 Essential (primary) hypertension: Secondary | ICD-10-CM | POA: Diagnosis not present

## 2021-11-14 DIAGNOSIS — K219 Gastro-esophageal reflux disease without esophagitis: Secondary | ICD-10-CM

## 2021-11-14 DIAGNOSIS — F319 Bipolar disorder, unspecified: Secondary | ICD-10-CM

## 2021-11-14 MED ORDER — GABAPENTIN 300 MG PO CAPS: 300.0000 mg | ORAL_CAPSULE | Freq: Three times a day (TID) | ORAL | 5 refills | Status: DC

## 2021-11-14 MED ORDER — PRAMIPEXOLE DIHYDROCHLORIDE 0.75 MG PO TABS
0.7500 mg | ORAL_TABLET | Freq: Two times a day (BID) | ORAL | 3 refills | Status: DC
Start: 2021-11-14 — End: 2022-01-30

## 2021-11-14 MED ORDER — VILAZODONE HCL 40 MG PO TABS: 40.0000 mg | ORAL_TABLET | Freq: Every day | ORAL | 1 refills | Status: DC

## 2021-11-14 MED ORDER — APIXABAN 5 MG PO TABS: 5.0000 mg | ORAL_TABLET | Freq: Two times a day (BID) | ORAL | 1 refills | Status: DC

## 2021-11-14 MED ORDER — METOPROLOL TARTRATE 25 MG PO TABS: 25.0000 mg | ORAL_TABLET | Freq: Two times a day (BID) | ORAL | 1 refills | Status: DC

## 2021-11-14 MED ORDER — LURASIDONE HCL 60 MG PO TABS: ORAL_TABLET | ORAL | 1 refills | Status: DC

## 2021-11-14 MED ORDER — ESOMEPRAZOLE MAGNESIUM 40 MG PO CPDR: 40.0000 mg | DELAYED_RELEASE_CAPSULE | Freq: Every day | ORAL | 1 refills | Status: DC

## 2021-11-14 NOTE — Addendum Note (Signed)
Addended by: Chevis Pretty on: 11/14/2021 10:55 AM ? ? Modules accepted: Orders ? ?

## 2021-11-14 NOTE — Progress Notes (Signed)
? ?Subjective:  ? ? Patient ID: Doris Smith, female    DOB: 1951/06/12, 71 y.o.   MRN: [id number] ? ? ?Chief Complaint: medical management of chronic issues  ?  ? ?HPI: ? ?Doris Smith is a 71 y.o. who identifies as a female who was assigned female at birth.  ? ?Social history: ?Lives with: husband ?Work history: retired ? ? ?Comes in today for follow up of the following chronic medical issues: ? ?1. Essential hypertension ?No c/o chest pain, sob or headache. Does not check blood pressure at home. ?BP Readings from Last 3 Encounters:  ?10/03/21 118/77  ?05/17/21 128/64  ?05/16/21 103/60  ? ? ? ?2. Paroxysmal atrial fibrillation (HCC) ?No c/o palpitations or heart racing ? ?3. Aortic atherosclerosis ([place]) ?Seen on chest xray. Cardiology follow up scheduled at end of month ? ?4. Irritable bowel syndrome with both constipation and diarrhea ?Bowel movements have been regular. No constipation or diarrhea. ? ?5. Gastroesophageal reflux disease without esophagitis ?Takes nexium daily and is working well to keep symptoms under control. ? ?6. Anxiety associated with depression ?7. Bipolar affective disorder, remission status unspecified ([place]) ?Is on viibryd and latuda,  that works well for her no medication side effects. Mainly due to her health problems. ? ?  11/14/2021  ? 10:31 AM 10/03/2021  ?  2:23 PM 05/16/2021  ? 10:43 AM 11/11/2020  ? 10:27 AM  ?GAD 7 : Generalized Anxiety Score  ?Nervous, Anxious, on Edge '3 3 3 3  '$ ?Control/stop worrying 1 1 0 3  ?Worry too much - different things 1 2 0 3  ?Trouble relaxing '3 3 3 3  '$ ?Restless '3 1 2 2  '$ ?Easily annoyed or irritable '3 2 3 2  '$ ?Afraid - awful might happen 0 0 0 0  ?Total GAD 7 Score '14 12 11 16  '$ ?Anxiety Difficulty Not difficult at all Somewhat difficult Not difficult at all Not difficult at all  ? ? ? ?  11/14/2021  ? 10:31 AM 10/03/2021  ?  2:23 PM 05/16/2021  ? 10:43 AM  ?Depression screen PHQ 2/9  ?Decreased Interest '3 2 3  '$ ?Down, Depressed, Hopeless '3 3 3  '$ ?PHQ - 2  Score '6 5 6  '$ ?Altered sleeping '3 3 3  '$ ?Tired, decreased energy '3 3 3  '$ ?Change in appetite 3 0 3  ?Feeling bad or failure about yourself  0 0 2  ?Trouble concentrating 2 2 0  ?Moving slowly or fidgety/restless 0 0 2  ?Suicidal thoughts 0 0 0  ?PHQ-9 Score '17 13 19  '$ ?Difficult doing work/chores Not difficult at all Not difficult at all Not difficult at all  ? ? ? ?8. Insomnia due to stress ?Wake sup frequently at night ? ?9. Restless leg syndrome ?Is on mirapex daily and that helps some. Says she needs to increase dose. ? ?10. Obesity (BMI 30-39.9) ?Weight is up 3lbs ?Wt Readings from Last 3 Encounters:  ?11/14/21 236 lb (107 kg)  ?10/03/21 233 lb (105.7 kg)  ?05/17/21 234 lb 4.8 oz (106.3 kg)  ? ?BMI Readings from Last 3 Encounters:  ?11/14/21 40.51 kg/m?  ?10/03/21 39.99 kg/m?  ?05/17/21 40.22 kg/m?  ? ? ? ? ?New complaints: ?None today ? ?Allergies  ?Allergen Reactions  ? Iohexol   ?  "over 20 years ago" had reaction "hurting in arm and heart"-Done in [place], [place].Marland Kitchenphysician stopped CT at that time.  ? Codeine Other (See Comments)  ?  Makes feel like she is wild ?  ?  Palonosetron Other (See Comments)  ?  headache  ? Prednisone Other (See Comments)  ?  Makes me very irritable  ? Sulfonamide Derivatives Other (See Comments)  ?  Upset stomach  ? Celecoxib Nausea And Vomiting  ? Sulfa Antibiotics Nausea And Vomiting  ? Vitamin B12 Rash  ? ?Outpatient Encounter Medications as of 11/14/2021  ?Medication Sig  ? anastrozole (ARIMIDEX) 1 MG tablet Take 1 tablet (1 mg total) by mouth daily.  ? apixaban (ELIQUIS) 5 MG TABS tablet Take 1 tablet (5 mg total) by mouth 2 (two) times daily.  ? clotrimazole-betamethasone (LOTRISONE) cream APPLY  CREAM TOPICALLY TWICE DAILY  ? esomeprazole (NEXIUM) 40 MG capsule Take 1 capsule (40 mg total) by mouth daily.  ? fluticasone (FLONASE) 50 MCG/ACT nasal spray Place 2 sprays into both nostrils daily.  ? gabapentin (NEURONTIN) 300 MG capsule Take 1 capsule (300 mg total) by mouth 3 (three) times  daily.  ? ibandronate (BONIVA) 150 MG tablet Take 1 tablet (150 mg total) by mouth every 30 (thirty) days. Take in the morning with a full glass of water, on an empty stomach, and do not take anything else by mouth or lie down for the next 30 min.  ? loratadine (CLARITIN) 10 MG tablet Take 10 mg by mouth daily.  ? Lurasidone HCl (LATUDA) 60 MG TABS Latuda 60 mg tablet ? TAKE 1 TABLET BY MOUTH ONCE DAILY  ? metoprolol tartrate (LOPRESSOR) 25 MG tablet Take 1 tablet (25 mg total) by mouth 2 (two) times daily.  ? midodrine (PROAMATINE) 2.5 MG tablet TAKE 1 TABLET BY MOUTH THREE TIMES DAILY WITH MEALS  ? pramipexole (MIRAPEX) 0.25 MG tablet Take 1 tablet (0.25 mg total) by mouth at bedtime as needed.  ? promethazine-dextromethorphan (PROMETHAZINE-DM) 6.25-15 MG/5ML syrup Take 5 mLs by mouth 4 (four) times daily as needed for cough.  ? triamcinolone (KENALOG) 0.1 % Apply 1 application topically 2 (two) times daily.  ? Vilazodone HCl (VIIBRYD) 40 MG TABS Take 1 tablet (40 mg total) by mouth daily.  ? ?Facility-Administered Encounter Medications as of 11/14/2021  ?Medication  ? heparin lock flush 100 unit/mL  ? sodium chloride flush (NS) 0.9 % injection 10 mL  ? ? ?Past Surgical History:  ?Procedure Laterality Date  ? ABDOMINAL HYSTERECTOMY    ? COLONOSCOPY    ? DILATION AND CURETTAGE OF UTERUS  1981  ? abnormal pap  ? KNEE ARTHROSCOPY Left 2007  ? x 2  ? LUMBAR LAMINECTOMY/DECOMPRESSION MICRODISCECTOMY  05/31/2011  ? Procedure: LUMBAR LAMINECTOMY/DECOMPRESSION MICRODISCECTOMY;  Surgeon: Johnn Hai;  Location: [facility];  Service: Orthopedics;  Laterality: Left;  Decompression Lumbar four to five and  Lumbar five to Sacral one on Left  (X-Ray)  ? MASTECTOMY W/ SENTINEL NODE BIOPSY Right 09/16/2018  ? Procedure: RIGHT MASTECTOMY WITH RIGHT AXILLARY SENTINEL LYMPH NODE BIOPSY;  Surgeon: Alphonsa Overall, MD;  Location: [facility];  Service: General;  Laterality: Right;  ? PARTIAL HYSTERECTOMY  1982  ? PORTACATH PLACEMENT Left  10/31/2018  ? Procedure: INSERTION PORT-A-CATH WITH ULTRASOUND;  Surgeon: Alphonsa Overall, MD;  Location: [facility];  Service: General;  Laterality: Left;  ? TONSILLECTOMY    ? as child  ? TOTAL KNEE ARTHROPLASTY Left 12/18/2005  ? ? ?Family History  ?Problem Relation Age of Onset  ? Anesthesia problems Mother   ? Heart disease Mother   ?     CHF, atrial fib  ? Heart failure Mother   ? Anesthesia problems Sister   ?  Colon polyps Father   ? Heart disease Father   ?     "Fluid around the heart"  ? Hypertension Sister   ? Breast cancer Maternal Aunt   ? Colon cancer Maternal Aunt   ? Prostate cancer Neg Hx   ? Ovarian cancer Neg Hx   ? ? ? ? ?Controlled substance contract: n/a ? ? ? ? ?Review of Systems  ?Constitutional:  Negative for diaphoresis.  ?Eyes:  Negative for pain.  ?Respiratory:  Negative for shortness of breath.   ?Cardiovascular:  Negative for chest pain, palpitations and leg swelling.  ?Gastrointestinal:  Negative for abdominal pain.  ?Endocrine: Negative for polydipsia.  ?Skin:  Negative for rash.  ?Neurological:  Negative for dizziness, weakness and headaches.  ?Hematological:  Does not bruise/bleed easily.  ?All other systems reviewed and are negative. ? ?   ?Objective:  ? Physical Exam ?Vitals and nursing note reviewed.  ?Constitutional:   ?   General: She is not in acute distress. ?   Appearance: Normal appearance. She is well-developed.  ?HENT:  ?   Head: Normocephalic.  ?   Right Ear: Tympanic membrane normal.  ?   Left Ear: Tympanic membrane normal.  ?   Nose: Nose normal.  ?   Mouth/Throat:  ?   Mouth: Mucous membranes are moist.  ?Eyes:  ?   Pupils: Pupils are equal, round, and reactive to light.  ?Neck:  ?   Vascular: No carotid bruit or JVD.  ?Cardiovascular:  ?   Rate and Rhythm: Normal rate and regular rhythm.  ?   Heart sounds: Normal heart sounds.  ?Pulmonary:  ?   Effort: Pulmonary effort is normal. No respiratory distress.  ?   Breath sounds: Normal breath sounds. No  wheezing or rales.  ?Chest:  ?   Chest wall: No tenderness.  ?Abdominal:  ?   General: Bowel sounds are normal. There is no distension or abdominal bruit.  ?   Palpations: Abdomen is soft. There is no hepatomegaly,

## 2021-11-14 NOTE — Patient Instructions (Addendum)
Restless Legs Syndrome Restless legs syndrome is a condition that causes uncomfortable feelings or sensations in the legs, especially while sitting or lying down. The sensations usually cause an overwhelming urge to move the legs. The arms can also sometimes be affected. The condition can range from mild to severe. The symptoms often interfere with a person's ability to sleep. What are the causes? The cause of this condition is not known. What increases the risk? The following factors may make you more likely to develop this condition: Being older than 50. Pregnancy. Being a woman. In general, the condition is more common in women than in men. A family history of the condition. Having iron deficiency. Overuse of caffeine, nicotine, or alcohol. Certain medical conditions, such as kidney disease, Parkinson's disease, or nerve damage. Certain medicines, such as those for high blood pressure, nausea, colds, allergies, depression, and some heart conditions. What are the signs or symptoms? The main symptom of this condition is uncomfortable sensations in the legs, such as: Pulling. Tingling. Prickling. Throbbing. Crawling. Burning. Usually, the sensations: Affect both sides of the body. Are worse when you sit or lie down. Are worse at night. These may make it difficult to fall asleep. Make you have a strong urge to move your legs. Are temporarily relieved by moving your legs or standing. The arms can also be affected, but this is rare. People who have this condition often have tiredness during the day because of their lack of sleep at night. How is this diagnosed? This condition may be diagnosed based on: Your symptoms. Blood tests. In some cases, you may be monitored in a sleep lab by a specialist (a sleep study). This can detect any disruptions in your sleep. How is this treated? This condition is treated by managing the symptoms. This may include: Lifestyle changes, such as  exercising, using relaxation techniques, and avoiding caffeine, alcohol, or tobacco. Iron supplements. Medicines. Parkinson's medications may be tried first. Anti-seizure medications can also be helpful. Follow these instructions at home: General instructions Take over-the-counter and prescription medicines only as told by your health care provider. Use methods to help relieve the uncomfortable sensations, such as: Massaging your legs. Walking or stretching. Taking a cold or hot bath. Keep all follow-up visits. This is important. Lifestyle     Practice good sleep habits. For example, go to bed and get up at the same time every day. Most adults should get 7-9 hours of sleep each night. Exercise regularly. Try to get at least 30 minutes of exercise most days of the week. Practice ways of relaxing, such as yoga or meditation. Avoid caffeine and alcohol. Do not use any products that contain nicotine or tobacco. These products include cigarettes, chewing tobacco, and vaping devices, such as e-cigarettes. If you need help quitting, ask your health care provider. Where to find more information National Institute of Neurological Disorders and Stroke: www.ninds.nih.gov Contact a health care provider if: Your symptoms get worse or they do not improve with treatment. Summary Restless legs syndrome is a condition that causes uncomfortable feelings or sensations in the legs, especially while sitting or lying down. The symptoms often interfere with your ability to sleep. This condition is treated by managing the symptoms. You may need to make lifestyle changes or take medicines. This information is not intended to replace advice given to you by your health care provider. Make sure you discuss any questions you have with your health care provider. Document Revised: 02/06/2021 Document Reviewed: 02/06/2021 Elsevier Patient Education    2023 Elsevier Inc.  

## 2021-11-15 DIAGNOSIS — M13861 Other specified arthritis, right knee: Secondary | ICD-10-CM | POA: Diagnosis not present

## 2021-11-15 DIAGNOSIS — F312 Bipolar disorder, current episode manic severe with psychotic features: Secondary | ICD-10-CM | POA: Diagnosis not present

## 2021-11-15 DIAGNOSIS — F410 Panic disorder [episodic paroxysmal anxiety] without agoraphobia: Secondary | ICD-10-CM | POA: Diagnosis not present

## 2021-11-15 DIAGNOSIS — F4310 Post-traumatic stress disorder, unspecified: Secondary | ICD-10-CM | POA: Diagnosis not present

## 2021-11-15 LAB — CBC WITH DIFFERENTIAL/PLATELET
Basophils Absolute: 0.1 10*3/uL (ref 0.0–0.2)
Basos: 1 %
EOS (ABSOLUTE): 0.1 10*3/uL (ref 0.0–0.4)
Eos: 2 %
Hematocrit: 38 % (ref 34.0–46.6)
Hemoglobin: 12.4 g/dL (ref 11.1–15.9)
Immature Grans (Abs): 0 10*3/uL (ref 0.0–0.1)
Immature Granulocytes: 0 %
Lymphocytes Absolute: 1 10*3/uL (ref 0.7–3.1)
Lymphs: 16 %
MCH: 27.8 pg (ref 26.6–33.0)
MCHC: 32.6 g/dL (ref 31.5–35.7)
MCV: 85 fL (ref 79–97)
Monocytes Absolute: 0.8 10*3/uL (ref 0.1–0.9)
Monocytes: 15 %
Neutrophils Absolute: 3.8 10*3/uL (ref 1.4–7.0)
Neutrophils: 66 %
Platelets: 261 10*3/uL (ref 150–450)
RBC: 4.46 x10E6/uL (ref 3.77–5.28)
RDW: 15 % (ref 11.7–15.4)
WBC: 5.8 10*3/uL (ref 3.4–10.8)

## 2021-11-15 LAB — LIPID PANEL
Chol/HDL Ratio: 2.8 ratio (ref 0.0–4.4)
Cholesterol, Total: 180 mg/dL (ref 100–199)
HDL: 65 mg/dL (ref >39)
LDL Chol Calc (NIH): 91 mg/dL (ref 0–99)
Triglycerides: 136 mg/dL (ref 0–149)
VLDL Cholesterol Cal: 24 mg/dL (ref 5–40)

## 2021-11-15 LAB — CMP14+EGFR
ALT: 13 IU/L (ref 0–32)
AST: 15 IU/L (ref 0–40)
Albumin/Globulin Ratio: 1.3 (ref 1.2–2.2)
Albumin: 3.8 g/dL (ref 3.8–4.8)
Alkaline Phosphatase: 124 IU/L — ABNORMAL HIGH (ref 44–121)
BUN/Creatinine Ratio: 14 (ref 12–28)
BUN: 13 mg/dL (ref 8–27)
Bilirubin Total: 0.4 mg/dL (ref 0.0–1.2)
CO2: 24 mmol/L (ref 20–29)
Calcium: 9.2 mg/dL (ref 8.7–10.3)
Chloride: 105 mmol/L (ref 96–106)
Creatinine, Ser: 0.94 mg/dL (ref 0.57–1.00)
Globulin, Total: 2.9 g/dL (ref 1.5–4.5)
Glucose: 85 mg/dL (ref 70–99)
Potassium: 4.1 mmol/L (ref 3.5–5.2)
Sodium: 143 mmol/L (ref 134–144)
Total Protein: 6.7 g/dL (ref 6.0–8.5)
eGFR: 66 mL/min/{1.73_m2} (ref >59)

## 2021-11-29 ENCOUNTER — Other Ambulatory Visit: Payer: Self-pay | Admitting: Cardiology

## 2021-12-04 NOTE — Progress Notes (Unsigned)
Cardiology Office Note   Date:  12/06/2021   ID:  Maryama, Kuriakose 1950-11-19, MRN [id number]  PCP:  Chevis Pretty, FNP  Cardiologist:   Jory Sims, NP   Chief Complaint  Patient presents with   Palpitations      History of Present Illness: Doris Smith is a 71 y.o. female who presents for ongoing assessment and management of palpitations, prior history of atrial fib.   Since I last saw her she has had no new cardiovascular complaints.  She has lots of joint problems.  However, she does water aerobics couple times a week and she is trying to increase this.  She is really been battling her weight.  She is had depression that she battles that contributes to this.  She is occasionally getting some dizziness.  She feels her heart racing or skipping.  She is not having any prolonged events in the last for about 5 to 20 minutes.  It feels like butterflies and she thinks this is her atrial fibrillation.  She has some dizziness when she is lying down at night.  She has not had any frank syncope.  She has not had any new shortness of breath.  Again she is limited in activities.  She has some chronic dyspnea.   Past Medical History:  Diagnosis Date   A-fib ([place]) 11/2012   PAF   Anxiety    takes Ativan   Asthma 04/07/2011   dx   Bipolar disorder ([place])    Cancer ([place])    Right breast   Depression    Early cataracts, bilateral    Fatty liver    Fibromyalgia    GERD (gastroesophageal reflux disease)    Irritable bowel syndrome    Mental disorder    dx bipolar   PONV (postoperative nausea and vomiting)    Sleep apnea 04/2011    does not wear CPAP    Past Surgical History:  Procedure Laterality Date   ABDOMINAL HYSTERECTOMY     COLONOSCOPY     DILATION AND CURETTAGE OF UTERUS  1981   abnormal pap   KNEE ARTHROSCOPY Left 2007   x 2   LUMBAR LAMINECTOMY/DECOMPRESSION MICRODISCECTOMY  05/31/2011   Procedure: LUMBAR LAMINECTOMY/DECOMPRESSION  MICRODISCECTOMY;  Surgeon: Johnn Hai;  Location: [facility];  Service: Orthopedics;  Laterality: Left;  Decompression Lumbar four to five and  Lumbar five to Sacral one on Left  (X-Ray)   MASTECTOMY W/ SENTINEL NODE BIOPSY Right 09/16/2018   Procedure: RIGHT MASTECTOMY WITH RIGHT AXILLARY SENTINEL LYMPH NODE BIOPSY;  Surgeon: Alphonsa Overall, MD;  Location: [facility];  Service: General;  Laterality: Right;   [place] Left 10/31/2018   Procedure: INSERTION PORT-A-CATH WITH ULTRASOUND;  Surgeon: Alphonsa Overall, MD;  Location: [place];  Service: General;  Laterality: Left;   TONSILLECTOMY     as child   TOTAL KNEE ARTHROPLASTY Left 12/18/2005     Current Outpatient Medications  Medication Sig Dispense Refill   anastrozole (ARIMIDEX) 1 MG tablet Take 1 tablet (1 mg total) by mouth daily. 90 tablet 4   apixaban (ELIQUIS) 5 MG TABS tablet Take 1 tablet (5 mg total) by mouth 2 (two) times daily. 180 tablet 1   clotrimazole-betamethasone (LOTRISONE) cream APPLY  CREAM TOPICALLY TWICE DAILY 45 g 0   esomeprazole (NEXIUM) 40 MG capsule Take 1 capsule (40 mg total) by mouth daily. 90 capsule 1   fluticasone (FLONASE) 50 MCG/ACT  nasal spray Place 2 sprays into both nostrils daily. 16 g 6   gabapentin (NEURONTIN) 300 MG capsule Take 1 capsule (300 mg total) by mouth 3 (three) times daily. 90 capsule 5   loratadine (CLARITIN) 10 MG tablet Take 10 mg by mouth daily.     Lurasidone HCl (LATUDA) 60 MG TABS TAKE 1 TABLET BY MOUTH ONCE DAILY 90 tablet 1   metoprolol tartrate (LOPRESSOR) 25 MG tablet Take 1 tablet (25 mg total) by mouth 2 (two) times daily. 180 tablet 1   midodrine (PROAMATINE) 2.5 MG tablet Take 1 tablet (2.5 mg total) by mouth 3 (three) times daily with meals. PATIENT MUST KEEP SCHEDULED APPOINTMENT FOR FUTURE REFILLS 90 tablet 0   pramipexole (MIRAPEX) 0.75 MG tablet Take 1 tablet (0.75 mg total) by mouth in the morning and at bedtime. 60  tablet 3   triamcinolone (KENALOG) 0.1 % Apply 1 application. topically 2 (two) times daily.     Vilazodone HCl (VIIBRYD) 40 MG TABS Take 1 tablet (40 mg total) by mouth daily. 90 tablet 1   No current facility-administered medications for this visit.   Facility-Administered Medications Ordered in Other Visits  Medication Dose Route Frequency Provider Last Rate Last Admin   heparin lock flush 100 unit/mL  500 Units Intravenous Once Magrinat, Virgie Dad, MD       sodium chloride flush (NS) 0.9 % injection 10 mL  10 mL Intravenous PRN Magrinat, Virgie Dad, MD        Allergies:   Iohexol, Codeine, Palonosetron, Prednisone, Sulfonamide derivatives, Celecoxib, Sulfa antibiotics, and Vitamin b12    ROS:  Please see the history of present illness.   Othzerwise, review of systems are positive for none.   All other systems are reviewed and negative.    PHYSICAL EXAM: VS:  BP 128/80 (BP Location: Left Arm, Patient Position: Sitting, Cuff Size: Large)   Pulse 72   Ht '5\' 4"'$  (1.626 m)   Wt 233 lb (105.7 kg)   BMI 39.99 kg/m  , BMI Body mass index is 39.99 kg/m. GENERAL:  Well appearing NECK:  No jugular venous distention, waveform within normal limits, carotid upstroke brisk and symmetric, no bruits, no thyromegaly LUNGS:  Clear to auscultation bilaterally CHEST:  Unremarkable HEART:  PMI not displaced or sustained,S1 and S2 within normal limits, no S3, no S4, no clicks, no rubs, no murmurs ABD:  Flat, positive bowel sounds normal in frequency in pitch, no bruits, no rebound, no guarding, no midline pulsatile mass, no hepatomegaly, no splenomegaly EXT:  2 plus pulses throughout, no edema, no cyanosis no clubbing    EKG:  EKG is  ordered today. The ekg ordered today demonstrates sinus bradycardia, rate 72, axis within normal limits, intervals within normal limits, no acute ST-T wave changes.   Recent Labs: 11/14/2021: ALT 13; BUN 13; Creatinine, Ser 0.94; Hemoglobin 12.4; Platelets 261;  Potassium 4.1; Sodium 143    Lipid Panel    Component Value Date/Time   CHOL 180 11/14/2021 1058   TRIG 136 11/14/2021 1058   TRIG 114 11/03/2014 1119   HDL 65 11/14/2021 1058   HDL 82 11/03/2014 1119   CHOLHDL 2.8 11/14/2021 1058   LDLCALC 91 11/14/2021 1058   LDLCALC 94 10/01/2013 0926      Wt Readings from Last 3 Encounters:  12/06/21 233 lb (105.7 kg)  11/14/21 236 lb (107 kg)  10/03/21 233 lb (105.7 kg)      Other studies Reviewed: Additional studies/ records that were reviewed  today include: Labs. Review of the above records demonstrates:  Please see elsewhere in the note.     ASSESSMENT AND PLAN:  ATRIAL FIB:  Doris Smith has a CHA2DS2 - VASc score of 2.   She has had no sustained tachyarrhythmias.  She tolerates anticoagulation.  I did review most recent labs and she is on the appropriate dose based on renal function and weight and age.  No change in therapy.  OBESITY:   I am going to send her to our Pharm D clinic to see if she might qualify for [facility].   If not there is a clinical trial involving this versus cagrilintide.    Current medicines are reviewed at length with the patient today.  The patient does not have concerns regarding medicines.  The following changes have been made:  None  Labs/ tests ordered today include: None  Orders Placed This Encounter  Procedures   AMB Referral to [facility] Pharm-D   EKG 12-Lead     Disposition:   FU with me in 12 months.    Signed, Minus Breeding, MD  12/06/2021 4:54 PM    [facility]

## 2021-12-06 ENCOUNTER — Ambulatory Visit (INDEPENDENT_AMBULATORY_CARE_PROVIDER_SITE_OTHER): Payer: BC Managed Care – PPO | Admitting: Cardiology

## 2021-12-06 ENCOUNTER — Encounter: Payer: Self-pay | Admitting: Cardiology

## 2021-12-06 VITALS — BP 128/80 | HR 72 | Ht 64.0 in | Wt 233.0 lb

## 2021-12-06 DIAGNOSIS — R42 Dizziness and giddiness: Secondary | ICD-10-CM | POA: Diagnosis not present

## 2021-12-06 DIAGNOSIS — I480 Paroxysmal atrial fibrillation: Secondary | ICD-10-CM

## 2021-12-06 DIAGNOSIS — E669 Obesity, unspecified: Secondary | ICD-10-CM | POA: Diagnosis not present

## 2021-12-06 NOTE — Patient Instructions (Signed)
Medication Instructions:  Your physician recommends that you continue on your current medications as directed. Please refer to the Current Medication list given to you today.  *If you need a refill on your cardiac medications before your next appointment, please call your pharmacy*  Follow-Up: At [facility] you and your health needs are our priority.  As part of our continuing mission to provide you with exceptional heart care, we have created designated Provider Care Teams.  These Care Teams include your primary Cardiologist (physician) and Advanced Practice Providers (APPs -  Physician Assistants and Nurse Practitioners) who all work together to provide you with the care you need, when you need it.  We recommend signing up for the patient portal called "MyChart".  Sign up information is provided on this After Visit Summary.  MyChart is used to connect with patients for Virtual Visits (Telemedicine).  Patients are able to view lab/test results, encounter notes, upcoming appointments, etc.  Non-urgent messages can be sent to your provider as well.   To learn more about what you can do with MyChart, go to [email or web address].    Your next appointment:   12 month(s)  The format for your next appointment:   In Person  Provider:   Dr. Allena Napoleon have been referred to: Pharmacist to discuss [facility]   Important Information About Sugar

## 2021-12-08 ENCOUNTER — Encounter: Payer: Self-pay | Admitting: Pharmacist Clinician (PhC)/ Clinical Pharmacy Specialist

## 2021-12-08 ENCOUNTER — Ambulatory Visit (INDEPENDENT_AMBULATORY_CARE_PROVIDER_SITE_OTHER): Payer: BC Managed Care – PPO | Admitting: Pharmacist Clinician (PhC)/ Clinical Pharmacy Specialist

## 2021-12-08 DIAGNOSIS — E669 Obesity, unspecified: Secondary | ICD-10-CM | POA: Diagnosis not present

## 2021-12-08 NOTE — Progress Notes (Unsigned)
HPI: Doris Smith is a 71 y.o. female patient referred to pharmacy clinic by Dr. Warren Lacy to initiate weight loss therapy with GLP1-RA.  Most recent BMI 40.5.  Patient on disabiltiy/medicare, husband insurance in primary  Working with pcp on depression issues and doesn't cook  [employer]  Significant medical history: hypertension Controlled, using midodrine prn  AF CHADS2-VASc 4 on Eliquis, metoprolol  CAD Noted on chest CT- thoracic aorta calcifications  Breast cancer Right mastectomy, now on anastrozole   Current weight management medications: none  Previously tried meds: did nutra-system for 4 months, no weight loss; other med tried in the 1970's ([place]?)  Current meds that may affect weight: none  Baseline weight/BMI: 40.5  Insurance payor: [place] - through husband  Diet: eats out regularly or often frozen dinners at home; sometimes skips lunch then just has dinner; has some nutra-system foods left; mixes Frosted flakes and Cheerios for breakfast; snacks more with depression  Exercise: water aerobics once weekly, will increase to twice weekly next week  Family History: mother had CHF, died at 54, father recently died from cancer; siblings without any significant heart disease; 2 boys, no known health issues   Confirmed patient not pregnant and no personal or family history of medullary thyroid carcinoma (MTC) or Multiple Endocrine Neoplasia syndrome type 2 (MEN 2).   Social History: no tobacco, no alcohol, diet caffeine free soda, some water (not enough) tea is half sweet  Labs: No results found for: "HGBA1C"  Wt Readings from Last 1 Encounters:  12/08/21 236 lb (107 kg)    BP Readings from Last 1 Encounters:  12/08/21 120/78   Pulse Readings from Last 1 Encounters:  12/08/21 69       Component Value Date/Time   CHOL 180 11/14/2021 1058   TRIG 136 11/14/2021 1058   TRIG 114 11/03/2014 1119   HDL 65 11/14/2021 1058    HDL 82 11/03/2014 1119   CHOLHDL 2.8 11/14/2021 1058   LDLCALC 91 11/14/2021 1058   LDLCALC 94 10/01/2013 0926    Past Medical History:  Diagnosis Date   A-fib ([place]) 11/2012   PAF   Anxiety    takes Ativan   Asthma 04/07/2011   dx   Bipolar disorder ([place])    Cancer ([place])    Right breast   Depression    Early cataracts, bilateral    Fatty liver    Fibromyalgia    GERD (gastroesophageal reflux disease)    Irritable bowel syndrome    Mental disorder    dx bipolar   PONV (postoperative nausea and vomiting)    Sleep apnea 04/2011    does not wear CPAP    Current Outpatient Medications on File Prior to Visit  Medication Sig Dispense Refill   anastrozole (ARIMIDEX) 1 MG tablet Take 1 tablet (1 mg total) by mouth daily. 90 tablet 4   apixaban (ELIQUIS) 5 MG TABS tablet Take 1 tablet (5 mg total) by mouth 2 (two) times daily. 180 tablet 1   benzonatate (TESSALON) 200 MG capsule Take 1 capsule by mouth daily as needed.     clotrimazole-betamethasone (LOTRISONE) cream APPLY  CREAM TOPICALLY TWICE DAILY 45 g 0   esomeprazole (NEXIUM) 40 MG capsule Take 1 capsule (40 mg total) by mouth daily. 90 capsule 1   fluticasone (FLONASE) 50 MCG/ACT nasal spray Place 2 sprays into both nostrils daily. 16 g 6   gabapentin (NEURONTIN) 300 MG capsule Take 1 capsule (300 mg total)  by mouth 3 (three) times daily. 90 capsule 5   loratadine (CLARITIN) 10 MG tablet Take 10 mg by mouth daily.     Lurasidone HCl (LATUDA) 60 MG TABS TAKE 1 TABLET BY MOUTH ONCE DAILY 90 tablet 1   metoprolol tartrate (LOPRESSOR) 25 MG tablet Take 1 tablet (25 mg total) by mouth 2 (two) times daily. 180 tablet 1   midodrine (PROAMATINE) 2.5 MG tablet Take 1 tablet (2.5 mg total) by mouth 3 (three) times daily with meals. PATIENT MUST KEEP SCHEDULED APPOINTMENT FOR FUTURE REFILLS 90 tablet 0   pramipexole (MIRAPEX) 0.75 MG tablet Take 1 tablet (0.75 mg total) by mouth in the morning and at bedtime. 60 tablet 3    prochlorperazine (COMPAZINE) 10 MG tablet Take 1 tablet by mouth daily.     tizanidine (ZANAFLEX) 2 MG capsule Take 2 mg by mouth 3 (three) times daily as needed.     triamcinolone (KENALOG) 0.1 % Apply 1 application. topically 2 (two) times daily.     Vilazodone HCl (VIIBRYD) 40 MG TABS Take 1 tablet (40 mg total) by mouth daily. 90 tablet 1   Current Facility-Administered Medications on File Prior to Visit  Medication Dose Route Frequency Provider Last Rate Last Admin   heparin lock flush 100 unit/mL  500 Units Intravenous Once Magrinat, Virgie Dad, MD       sodium chloride flush (NS) 0.9 % injection 10 mL  10 mL Intravenous PRN Magrinat, Virgie Dad, MD        Allergies  Allergen Reactions   Iohexol     "over 20 years ago" had reaction "hurting in arm and heart"-Done in [place], [place].Mar[place] Kitchenphysician stopped CT at that time.   Codeine Other (See Comments)    Makes feel like she is wild    Palonosetron Other (See Comments)    headache   Prednisone Other (See Comments)    Makes me very irritable   Sulfonamide Derivatives Other (See Comments)    Upset stomach   Celecoxib Nausea And Vomiting   Sulfa Antibiotics Nausea And Vomiting   Vitamin B12 Rash    Obesity (BMI 30-39.9) Weight loss - Patient has not met goal of at least 5% of body weight loss with comprehensive lifestyle modifications alone in the past 3-6 months. Pharmacotherapy is appropriate to pursue as augmentation. Will start Wegovy.   Confirmed patient not pregnant and no personal or family history of medullary thyroid carcinoma (MTC) or Multiple Endocrine Neoplasia syndrome type 2 (MEN 2).   Advised patient on common side effects including nausea, diarrhea, dyspepsia, decreased appetite, and fatigue. Counseled patient on reducing meal size and how to titrate medication to minimize side effects. Patient aware to call if intolerable side effects or if experiencing dehydration, abdominal pain, or dizziness. Patient will adhere to dietary  modifications and will target at least 150 minutes of moderate intensity exercise weekly.   Patient is aware of medication shortage and no guarantee that, if covered will be able to find medication at pharmacy at this time.    Titration Plan:  Will plan to follow the titration plan as below, pending patient is tolerating each dose before increasing to the next. Can slow titration if needed for tolerability.    -Month 1: Inject 0.25 mg SQ once weekly x 4 weeks -Month 2: Inject 0.5 mg SQ once weekly x 4 weeks -Month 3: Inject 1 mg SQ once weekly x 4 weeks -Month 4: Inject 1.7 mg SQ once weekly   Follow up in  3 months.   Tommy Medal PharmD CPP [place]

## 2021-12-08 NOTE — Patient Instructions (Signed)
We will start the prior authorization process to get Wegovy covered by your insurance.   TIPS FOR SUCCESS Write down the reasons why you want to lose weight and post it in a place where you'll see it often. Start small and work your way up. Keep in mind that it takes time to achieve goals, and small steps add up. Any additional movements help to burn calories. Taking the stairs rather than the elevator and parking at the far end of your parking lot are easy ways to start. Brisk walking for at least 30 minutes 4 or more days of the week is an excellent goal to work toward  [place] Did you know that it can take 15 minutes or more for your brain to receive the message that you've eaten? That means that, if you eat less food, but consume it slower, you may still feel satisfied. Eating a lot of fruits and vegetables can also help you feel fuller. Eat off of smaller plates so that moderate portions don't seem too small  TITRATION PLAN Will plan to follow the titration plan as below, pending patient is tolerating each dose before increasing to the next. Can slow titration if needed for tolerability.    -Weeks 1-4: Inject 0.25 mg SQ once weekly  -Weeks 5-8: Inject 0.5 mg SQ once weekly  -Weeks 9-12 Inject 1 mg SQ once weekly   Follow up in 3 months - on medication.  If you have any questions or concerns, please reach out to [place].  Reece Mcbroom/Chris at [phone].  [place]

## 2021-12-16 ENCOUNTER — Encounter: Payer: Self-pay | Admitting: Pharmacist Clinician (PhC)/ Clinical Pharmacy Specialist

## 2021-12-16 NOTE — Assessment & Plan Note (Signed)
Weight loss - Patient has not met goal of at least 5% of body weight loss with comprehensive lifestyle modifications alone in the past 3-6 months. Pharmacotherapy is appropriate to pursue as augmentation. Will start Wegovy.   Confirmed patient not pregnant and no personal or family history of medullary thyroid carcinoma (MTC) or Multiple Endocrine Neoplasia syndrome type 2 (MEN 2).   Advised patient on common side effects including nausea, diarrhea, dyspepsia, decreased appetite, and fatigue. Counseled patient on reducing meal size and how to titrate medication to minimize side effects. Patient aware to call if intolerable side effects or if experiencing dehydration, abdominal pain, or dizziness. Patient will adhere to dietary modifications and will target at least 150 minutes of moderate intensity exercise weekly.   Patient is aware of medication shortage and no guarantee that, if covered will be able to find medication at pharmacy at this time.    Titration Plan:  Will plan to follow the titration plan as below, pending patient is tolerating each dose before increasing to the next. Can slow titration if needed for tolerability.    -Month 1: Inject 0.25 mg SQ once weekly x 4 weeks -Month 2: Inject 0.5 mg SQ once weekly x 4 weeks -Month 3: Inject 1 mg SQ once weekly x 4 weeks -Month 4: Inject 1.7 mg SQ once weekly   Follow up in 3 months.

## 2021-12-19 ENCOUNTER — Telehealth: Payer: Self-pay | Admitting: Cardiology

## 2021-12-19 NOTE — Telephone Encounter (Signed)
Patient called stating the pharmacist was going to call her back once they got authorization from her insurance for the injection to loss weight.   Patient is calling to check on status.

## 2021-12-20 DIAGNOSIS — F410 Panic disorder [episodic paroxysmal anxiety] without agoraphobia: Secondary | ICD-10-CM | POA: Diagnosis not present

## 2021-12-20 DIAGNOSIS — F312 Bipolar disorder, current episode manic severe with psychotic features: Secondary | ICD-10-CM | POA: Diagnosis not present

## 2021-12-20 DIAGNOSIS — F4310 Post-traumatic stress disorder, unspecified: Secondary | ICD-10-CM | POA: Diagnosis not present

## 2021-12-20 NOTE — Telephone Encounter (Signed)
Returned call - PA approved to 07/18/22.  Advised that she will need to find pharmacy with 0.5 mg dose in stock and we can give her box of 0.25 mg to get her started.  Pt will call back with that information.   Patient unable to find at any pharmacies.  She will continue to search, aware that it could be up to 8-12 weeks before available.  She will call once she finds a source.

## 2021-12-29 ENCOUNTER — Other Ambulatory Visit: Payer: Self-pay | Admitting: Cardiology

## 2022-01-17 DIAGNOSIS — F4310 Post-traumatic stress disorder, unspecified: Secondary | ICD-10-CM | POA: Diagnosis not present

## 2022-01-17 DIAGNOSIS — F410 Panic disorder [episodic paroxysmal anxiety] without agoraphobia: Secondary | ICD-10-CM | POA: Diagnosis not present

## 2022-01-17 DIAGNOSIS — F312 Bipolar disorder, current episode manic severe with psychotic features: Secondary | ICD-10-CM | POA: Diagnosis not present

## 2022-01-30 ENCOUNTER — Encounter: Payer: Self-pay | Admitting: Nurse Practitioner

## 2022-01-30 ENCOUNTER — Ambulatory Visit (INDEPENDENT_AMBULATORY_CARE_PROVIDER_SITE_OTHER): Payer: BC Managed Care – PPO | Admitting: Nurse Practitioner

## 2022-01-30 VITALS — BP 110/78 | HR 136 | Temp 96.3°F | Resp 20 | Ht 64.0 in | Wt 240.0 lb

## 2022-01-30 DIAGNOSIS — G2581 Restless legs syndrome: Secondary | ICD-10-CM | POA: Diagnosis not present

## 2022-01-30 MED ORDER — PRAMIPEXOLE DIHYDROCHLORIDE 1 MG PO TABS: 1.0000 mg | ORAL_TABLET | Freq: Three times a day (TID) | ORAL | 1 refills | Status: DC

## 2022-01-30 NOTE — Patient Instructions (Signed)
Restless Legs Syndrome Restless legs syndrome is a condition that causes uncomfortable feelings or sensations in the legs, especially while sitting or lying down. The sensations usually cause an overwhelming urge to move the legs. The arms can also sometimes be affected. The condition can range from mild to severe. The symptoms often interfere with a person's ability to sleep. What are the causes? The cause of this condition is not known. What increases the risk? The following factors may make you more likely to develop this condition: Being older than 50. Pregnancy. Being a woman. In general, the condition is more common in women than in men. A family history of the condition. Having iron deficiency. Overuse of caffeine, nicotine, or alcohol. Certain medical conditions, such as kidney disease, Parkinson's disease, or nerve damage. Certain medicines, such as those for high blood pressure, nausea, colds, allergies, depression, and some heart conditions. What are the signs or symptoms? The main symptom of this condition is uncomfortable sensations in the legs, such as: Pulling. Tingling. Prickling. Throbbing. Crawling. Burning. Usually, the sensations: Affect both sides of the body. Are worse when you sit or lie down. Are worse at night. These may make it difficult to fall asleep. Make you have a strong urge to move your legs. Are temporarily relieved by moving your legs or standing. The arms can also be affected, but this is rare. People who have this condition often have tiredness during the day because of their lack of sleep at night. How is this diagnosed? This condition may be diagnosed based on: Your symptoms. Blood tests. In some cases, you may be monitored in a sleep lab by a specialist (a sleep study). This can detect any disruptions in your sleep. How is this treated? This condition is treated by managing the symptoms. This may include: Lifestyle changes, such as  exercising, using relaxation techniques, and avoiding caffeine, alcohol, or tobacco. Iron supplements. Medicines. Parkinson's medications may be tried first. Anti-seizure medications can also be helpful. Follow these instructions at home: General instructions Take over-the-counter and prescription medicines only as told by your health care provider. Use methods to help relieve the uncomfortable sensations, such as: Massaging your legs. Walking or stretching. Taking a cold or hot bath. Keep all follow-up visits. This is important. Lifestyle     Practice good sleep habits. For example, go to bed and get up at the same time every day. Most adults should get 7-9 hours of sleep each night. Exercise regularly. Try to get at least 30 minutes of exercise most days of the week. Practice ways of relaxing, such as yoga or meditation. Avoid caffeine and alcohol. Do not use any products that contain nicotine or tobacco. These products include cigarettes, chewing tobacco, and vaping devices, such as e-cigarettes. If you need help quitting, ask your health care provider. Where to find more information National Institute of Neurological Disorders and Stroke: www.ninds.nih.gov Contact a health care provider if: Your symptoms get worse or they do not improve with treatment. Summary Restless legs syndrome is a condition that causes uncomfortable feelings or sensations in the legs, especially while sitting or lying down. The symptoms often interfere with your ability to sleep. This condition is treated by managing the symptoms. You may need to make lifestyle changes or take medicines. This information is not intended to replace advice given to you by your health care provider. Make sure you discuss any questions you have with your health care provider. Document Revised: 02/06/2021 Document Reviewed: 02/06/2021 Elsevier Patient Education    2023 [employer] Inc.  

## 2022-01-30 NOTE — Progress Notes (Signed)
   Subjective:    Patient ID: Doris Smith, female    DOB: [birth year], 71 y.o.   MRN: [id number]  Chief Complaint: restless leg   HPI Patient comes in to discuss her restless leg issues. She was last seen on 11/14/21 and at that time we increased her mirapex to 0.75 BID. She says she still cannot keep her legs still.     Review of Systems  Constitutional:  Negative for diaphoresis.  Eyes:  Negative for pain.  Respiratory:  Negative for shortness of breath.   Cardiovascular:  Negative for chest pain, palpitations and leg swelling.  Gastrointestinal:  Negative for abdominal pain.  Endocrine: Negative for polydipsia.  Skin:  Negative for rash.  Neurological:  Negative for dizziness, weakness and headaches.  Hematological:  Does not bruise/bleed easily.  All other systems reviewed and are negative.      Objective:   Physical Exam Vitals and nursing note reviewed.  Constitutional:      General: She is not in acute distress.    Appearance: Normal appearance. She is well-developed.  Neck:     Vascular: No carotid bruit or JVD.  Cardiovascular:     Rate and Rhythm: Normal rate. Rhythm irregular.     Heart sounds: Normal heart sounds.  Pulmonary:     Effort: Pulmonary effort is normal. No respiratory distress.     Breath sounds: Normal breath sounds. No wheezing or rales.  Chest:     Chest wall: No tenderness.  Abdominal:     General: There is no distension or abdominal bruit.     Palpations: There is no hepatomegaly, splenomegaly, mass or pulsatile mass.     Tenderness: There is no abdominal tenderness.  Musculoskeletal:        General: Normal range of motion.     Cervical back: Normal range of motion and neck supple.     Comments: Patient cannot keep legs still  Lymphadenopathy:     Cervical: No cervical adenopathy.  Skin:    General: Skin is warm and dry.  Neurological:     Mental Status: She is alert and oriented to person, place, and time.     Deep Tendon  Reflexes: Reflexes are normal and symmetric.  Psychiatric:        Behavior: Behavior normal.        Thought Content: Thought content normal.        Judgment: Judgment normal.     BP 110/78   Pulse (!) 136   Temp (!) 96.3 F (35.7 C) (Temporal)   Resp 20   Ht '5\' 4"'$  (1.626 m)   Wt 240 lb (108.9 kg)   SpO2 99%   BMI 41.20 kg/m        Assessment & Plan:   Doris Smith in today with chief complaint of restless leg   1. Restless leg syndrome Keep legs warm - pramipexole (MIRAPEX) 1 MG tablet; Take 1 tablet (1 mg total) by mouth 3 (three) times daily.  Dispense: 90 tablet; Refill: 1    The above assessment and management plan was discussed with the patient. The patient verbalized understanding of and has agreed to the management plan. Patient is aware to call the clinic if symptoms persist or worsen. Patient is aware when to return to the clinic for a follow-up visit. Patient educated on when it is appropriate to go to the emergency department.   [name] Done, FNP

## 2022-02-14 DIAGNOSIS — F410 Panic disorder [episodic paroxysmal anxiety] without agoraphobia: Secondary | ICD-10-CM | POA: Diagnosis not present

## 2022-02-14 DIAGNOSIS — F4310 Post-traumatic stress disorder, unspecified: Secondary | ICD-10-CM | POA: Diagnosis not present

## 2022-02-14 DIAGNOSIS — F312 Bipolar disorder, current episode manic severe with psychotic features: Secondary | ICD-10-CM | POA: Diagnosis not present

## 2022-02-16 ENCOUNTER — Encounter: Payer: Self-pay | Admitting: Nurse Practitioner

## 2022-02-16 ENCOUNTER — Ambulatory Visit: Payer: BC Managed Care – PPO | Admitting: Nurse Practitioner

## 2022-02-16 VITALS — BP 137/85 | HR 56 | Temp 97.4°F | Resp 20 | Ht 64.0 in | Wt 244.0 lb

## 2022-02-16 DIAGNOSIS — R609 Edema, unspecified: Secondary | ICD-10-CM | POA: Diagnosis not present

## 2022-02-16 MED ORDER — FUROSEMIDE 20 MG PO TABS: 20.0000 mg | ORAL_TABLET | Freq: Every day | ORAL | 3 refills | Status: DC

## 2022-02-16 NOTE — Patient Instructions (Signed)
Peripheral Edema  Peripheral edema is swelling that is caused by a buildup of fluid. Peripheral edema most often affects the lower legs, ankles, and feet. It can also develop in the arms, hands, and face. The area of the body that has peripheral edema will look swollen. It may also feel heavy or warm. Your clothes may start to feel tight. Pressing on the area may make a temporary dent in your skin (pitting edema). You may not be able to move your swollen arm or leg as much as usual. There are many causes of peripheral edema. It can happen because of a complication of other conditions such as heart failure, kidney disease, or a problem with your circulation. It also can be a side effect of certain medicines or happen because of an infection. It often happens to women during pregnancy. Sometimes, the cause is not known. Follow these instructions at home: Managing pain, stiffness, and swelling  Raise (elevate) your legs while you are sitting or lying down. Move around often to prevent stiffness and to reduce swelling. Do not sit or stand for long periods of time. Do not wear tight clothing. Do not wear garters on your upper legs. Exercise your legs to get your circulation going. This helps to move the fluid back into your blood vessels, and it may help the swelling go down. Wear compression stockings as told by your health care provider. These stockings help to prevent blood clots and reduce swelling in your legs. It is important that these are the correct size. These stockings should be prescribed by your doctor to prevent possible injuries. If elastic bandages or wraps are recommended, use them as told by your health care provider. Medicines Take over-the-counter and prescription medicines only as told by your health care provider. Your health care provider may prescribe medicine to help your body get rid of excess water (diuretic). Take this medicine if you are told to take it. General  instructions Eat a low-salt (low-sodium) diet as told by your health care provider. Sometimes, eating less salt may reduce swelling. Pay attention to any changes in your symptoms. Moisturize your skin daily to help prevent skin from cracking and draining. Keep all follow-up visits. This is important. Contact a health care provider if: You have a fever. You have swelling in only one leg. You have increased swelling, redness, or pain in one or both of your legs. You have drainage or sores at the area where you have edema. Get help right away if: You have edema that starts suddenly or is getting worse, especially if you are pregnant or have a medical condition. You develop shortness of breath, especially when you are lying down. You have pain in your chest or abdomen. You feel weak. You feel like you will faint. These symptoms may be an emergency. Get help right away. Call 911. Do not wait to see if the symptoms will go away. Do not drive yourself to the hospital. Summary Peripheral edema is swelling that is caused by a buildup of fluid. Peripheral edema most often affects the lower legs, ankles, and feet. Move around often to prevent stiffness and to reduce swelling. Do not sit or stand for long periods of time. Pay attention to any changes in your symptoms. Contact a health care provider if you have edema that starts suddenly or is getting worse, especially if you are pregnant or have a medical condition. Get help right away if you develop shortness of breath, especially when lying down.   This information is not intended to replace advice given to you by your health care provider. Make sure you discuss any questions you have with your health care provider. Document Revised: 02/28/2021 Document Reviewed: 02/28/2021 Elsevier Patient Education  2023 [employer] Inc.  

## 2022-02-16 NOTE — Progress Notes (Signed)
Subjective:    Patient ID: Doris Smith, female    DOB: Jul 08, 1951, 71 y.o.   MRN: [id number]   Chief Complaint: Feet and legs swelling   HPI Patient comes in today c/o swelling of bill lower ext. Has been going on for several weeks. She denies any chest pain or SOB.    Review of Systems  Constitutional:  Negative for diaphoresis.  Eyes:  Negative for pain.  Respiratory:  Negative for shortness of breath.   Cardiovascular:  Negative for chest pain, palpitations and leg swelling.  Gastrointestinal:  Negative for abdominal pain.  Endocrine: Negative for polydipsia.  Skin:  Negative for rash.  Neurological:  Negative for dizziness, weakness and headaches.  Hematological:  Does not bruise/bleed easily.  All other systems reviewed and are negative.      Objective:   Physical Exam Vitals and nursing note reviewed.  Constitutional:      General: She is not in acute distress.    Appearance: Normal appearance. She is well-developed.  Neck:     Vascular: No carotid bruit or JVD.  Cardiovascular:     Rate and Rhythm: Normal rate and regular rhythm.     Heart sounds: Normal heart sounds.  Pulmonary:     Effort: Pulmonary effort is normal. No respiratory distress.     Breath sounds: Normal breath sounds. No wheezing or rales.  Chest:     Chest wall: No tenderness.  Abdominal:     General: There is no distension or abdominal bruit.     Palpations: There is no hepatomegaly, splenomegaly, mass or pulsatile mass.     Tenderness: There is no abdominal tenderness.  Musculoskeletal:        General: Normal range of motion.     Cervical back: Normal range of motion and neck supple.     Right lower leg: Edema (1+) present.     Left lower leg: Edema (1+) present.  Lymphadenopathy:     Cervical: No cervical adenopathy.  Skin:    General: Skin is warm and dry.  Neurological:     Mental Status: She is alert and oriented to person, place, and time.     Deep Tendon Reflexes:  Reflexes are normal and symmetric.  Psychiatric:        Behavior: Behavior normal.        Thought Content: Thought content normal.        Judgment: Judgment normal.    BP 137/85   Pulse (!) 56   Temp (!) 97.4 F (36.3 C) (Temporal)   Resp 20   Ht '5\' 4"'$  (1.626 m)   Wt 244 lb (110.7 kg)   SpO2 100%   BMI 41.88 kg/m         Assessment & Plan:   Doris Smith in today with chief complaint of Feet and legs swelling   1. Peripheral edema Elevate legs when sitting Compression socks if going to be on feet all day Keep followup appointment - furosemide (LASIX) 20 MG tablet; Take 1 tablet (20 mg total) by mouth daily.  Dispense: 30 tablet; Refill: 3    The above assessment and management plan was discussed with the patient. The patient verbalized understanding of and has agreed to the management plan. Patient is aware to call the clinic if symptoms persist or worsen. Patient is aware when to return to the clinic for a follow-up visit. Patient educated on when it is appropriate to go to the emergency department.   [name]  Hassell Done, [place]

## 2022-03-16 DIAGNOSIS — M25561 Pain in right knee: Secondary | ICD-10-CM | POA: Diagnosis not present

## 2022-03-16 DIAGNOSIS — M1711 Unilateral primary osteoarthritis, right knee: Secondary | ICD-10-CM | POA: Diagnosis not present

## 2022-03-21 ENCOUNTER — Other Ambulatory Visit: Payer: Self-pay | Admitting: Nurse Practitioner

## 2022-03-21 DIAGNOSIS — G2581 Restless legs syndrome: Secondary | ICD-10-CM

## 2022-03-21 DIAGNOSIS — F312 Bipolar disorder, current episode manic severe with psychotic features: Secondary | ICD-10-CM | POA: Diagnosis not present

## 2022-03-21 DIAGNOSIS — F4310 Post-traumatic stress disorder, unspecified: Secondary | ICD-10-CM | POA: Diagnosis not present

## 2022-03-21 DIAGNOSIS — F410 Panic disorder [episodic paroxysmal anxiety] without agoraphobia: Secondary | ICD-10-CM | POA: Diagnosis not present

## 2022-03-24 DIAGNOSIS — S335XXA Sprain of ligaments of lumbar spine, initial encounter: Secondary | ICD-10-CM | POA: Diagnosis not present

## 2022-04-10 ENCOUNTER — Other Ambulatory Visit: Payer: Self-pay | Admitting: Nurse Practitioner

## 2022-04-10 DIAGNOSIS — G2581 Restless legs syndrome: Secondary | ICD-10-CM

## 2022-04-18 DIAGNOSIS — F410 Panic disorder [episodic paroxysmal anxiety] without agoraphobia: Secondary | ICD-10-CM | POA: Diagnosis not present

## 2022-04-18 DIAGNOSIS — F4310 Post-traumatic stress disorder, unspecified: Secondary | ICD-10-CM | POA: Diagnosis not present

## 2022-04-18 DIAGNOSIS — F312 Bipolar disorder, current episode manic severe with psychotic features: Secondary | ICD-10-CM | POA: Diagnosis not present

## 2022-05-15 ENCOUNTER — Inpatient Hospital Stay (HOSPITAL_BASED_OUTPATIENT_CLINIC_OR_DEPARTMENT_OTHER): Payer: BC Managed Care – PPO | Admitting: Hematology and Oncology

## 2022-05-15 ENCOUNTER — Other Ambulatory Visit: Payer: Self-pay | Admitting: Hematology and Oncology

## 2022-05-15 ENCOUNTER — Other Ambulatory Visit: Payer: Self-pay

## 2022-05-15 ENCOUNTER — Inpatient Hospital Stay: Payer: BC Managed Care – PPO | Attending: Hematology and Oncology

## 2022-05-15 ENCOUNTER — Encounter: Payer: Self-pay | Admitting: Hematology and Oncology

## 2022-05-15 VITALS — BP 137/51 | HR 64 | Temp 97.8°F | Resp 18 | Wt 243.6 lb

## 2022-05-15 DIAGNOSIS — C50411 Malignant neoplasm of upper-outer quadrant of right female breast: Secondary | ICD-10-CM

## 2022-05-15 DIAGNOSIS — Z9221 Personal history of antineoplastic chemotherapy: Secondary | ICD-10-CM | POA: Insufficient documentation

## 2022-05-15 DIAGNOSIS — Z170 Estrogen receptor positive status [ER+]: Secondary | ICD-10-CM | POA: Diagnosis not present

## 2022-05-15 DIAGNOSIS — Z923 Personal history of irradiation: Secondary | ICD-10-CM | POA: Insufficient documentation

## 2022-05-15 DIAGNOSIS — Z79811 Long term (current) use of aromatase inhibitors: Secondary | ICD-10-CM | POA: Insufficient documentation

## 2022-05-15 DIAGNOSIS — C50811 Malignant neoplasm of overlapping sites of right female breast: Secondary | ICD-10-CM | POA: Insufficient documentation

## 2022-05-15 DIAGNOSIS — Z9011 Acquired absence of right breast and nipple: Secondary | ICD-10-CM | POA: Insufficient documentation

## 2022-05-15 DIAGNOSIS — R911 Solitary pulmonary nodule: Secondary | ICD-10-CM | POA: Diagnosis not present

## 2022-05-15 LAB — COMPREHENSIVE METABOLIC PANEL
ALT: 17 U/L (ref 0–44)
AST: 19 U/L (ref 15–41)
Albumin: 3.7 g/dL (ref 3.5–5.0)
Alkaline Phosphatase: 107 U/L (ref 38–126)
Anion gap: 6 (ref 5–15)
BUN: 13 mg/dL (ref 8–23)
CO2: 29 mmol/L (ref 22–32)
Calcium: 9.1 mg/dL (ref 8.9–10.3)
Chloride: 106 mmol/L (ref 98–111)
Creatinine, Ser: 1.07 mg/dL — ABNORMAL HIGH (ref 0.44–1.00)
GFR, Estimated: 56 mL/min — ABNORMAL LOW (ref >60)
Glucose, Bld: 147 mg/dL — ABNORMAL HIGH (ref 70–99)
Potassium: 4.3 mmol/L (ref 3.5–5.1)
Sodium: 141 mmol/L (ref 135–145)
Total Bilirubin: 0.6 mg/dL (ref 0.3–1.2)
Total Protein: 6.7 g/dL (ref 6.5–8.1)

## 2022-05-15 LAB — CBC WITH DIFFERENTIAL/PLATELET
Abs Immature Granulocytes: 0.02 10*3/uL (ref 0.00–0.07)
Basophils Absolute: 0.1 10*3/uL (ref 0.0–0.1)
Basophils Relative: 1 %
Eosinophils Absolute: 0.1 10*3/uL (ref 0.0–0.5)
Eosinophils Relative: 2 %
HCT: 35 % — ABNORMAL LOW (ref 36.0–46.0)
Hemoglobin: 11.2 g/dL — ABNORMAL LOW (ref 12.0–15.0)
Immature Granulocytes: 0 %
Lymphocytes Relative: 13 %
Lymphs Abs: 0.8 10*3/uL (ref 0.7–4.0)
MCH: 28.4 pg (ref 26.0–34.0)
MCHC: 32 g/dL (ref 30.0–36.0)
MCV: 88.6 fL (ref 80.0–100.0)
Monocytes Absolute: 0.5 10*3/uL (ref 0.1–1.0)
Monocytes Relative: 9 %
Neutro Abs: 4.7 10*3/uL (ref 1.7–7.7)
Neutrophils Relative %: 75 %
Platelets: 222 10*3/uL (ref 150–400)
RBC: 3.95 MIL/uL (ref 3.87–5.11)
RDW: 15.5 % (ref 11.5–15.5)
WBC: 6.3 10*3/uL (ref 4.0–10.5)
nRBC: 0 % (ref 0.0–0.2)

## 2022-05-15 MED ORDER — IBANDRONATE SODIUM 150 MG PO TABS: 150.0000 mg | ORAL_TABLET | ORAL | 0 refills | Status: DC

## 2022-05-15 NOTE — Progress Notes (Signed)
[facility]  [phone] [phone]    ID: [name] DOB: 02-27-1951  MR#: [id number]  SPQ#:[id number]  Patient Care Team: Chevis Pretty, FNP as PCP - General (Nurse Practitioner) Lendon Colonel, NP as PCP - Cardiology (Nurse Practitioner) Alphonsa Overall, MD as Consulting Physician (General Surgery) Magrinat, Virgie Dad, MD (Inactive) as Consulting Physician (Oncology) Kyung Rudd, MD as Consulting Physician (Radiation Oncology) Elijio Miles, MD as Consulting Physician (Pulmonary Disease) Netta Cedars, MD as Consulting Physician (Orthopedic Surgery) Susa Day, MD as Consulting Physician (Orthopedic Surgery) Janeth Rase, NP as Nurse Practitioner (Adult Health Nurse Practitioner) Mauro Kaufmann, RN as Oncology Nurse Navigator Rockwell Germany, RN as Oncology Nurse Navigator Irene Limbo, MD as Consulting Physician (Plastic Surgery) Minus Breeding, MD as Consulting Physician (Cardiology) Benay Pike, MD OTHER MD: Chapman Moss, psychiatry   CHIEF COMPLAINT: Estrogen receptor positive breast cancer (s/p right mastectomy)  CURRENT TREATMENT: anastrozole; ibandronate   INTERVAL HISTORY:  Doris Smith returns today for follow-up of her estrogen receptor positive breast cancer.  She has been taking anastrozole as recommended. She has noticed some pan under the left breast. She has been taking muscle relaxants for back pain. She continues on anastrozole.  She is overdue for mammogram Her most recent bone density screening on 11/11/2020 showed a T-score of -2.6, which is considered osteoporotic.   HISTORY OF CURRENT ILLNESS: From the original intake note:  "Doris Smith" presented to her PCP, Dr. Hassell Done, with right breast pain, fullness, and nipple inversion since November 2019. She proceeded to undergo bilateral diagnostic mammography with tomography and right breast ultrasonography at [facility] on 07/31/2018  showing: findings compatible with multicentric breast cancer spanning at least the upper inner and upper outer quadrants of the right breast; normal right axilla. Palpable firmness of approximately 1.5 cm was noted in the 9 o'clock position, which was also seen on ultrasound with indisctinct margins measuring 2.7 x 2.3 x 2.2 cm. At 1 o'clock, a mass with poorly defined margins measures 1.9 x 1 x 0.8 cm. Additional poorly defined masses were seen in the 12 o'clock retroareolar region.  Accordingly on 08/02/2018 she proceeded to biopsy of the right breast area in question. The pathology ([id number]) from this procedure showed: invasive mammary carcinoma at 9 o'clock and 1 o'clock; e-cadherin negative, consistent with lobular phenotype; grade 2-3. Prognostic indicators significant for: estrogen receptor, 90% positive and progesterone receptor, 1% positive, both with strong staining intensity. Proliferation marker Ki67 at 1%. HER2 equivocal by immunohistochemistry, 2+ but negative by fluorescent in situ hybridization with a signals ratio 1.04 and number per cell 1.2.   The patient's subsequent history is as detailed below.   PAST MEDICAL HISTORY: Past Medical History:  Diagnosis Date   A-fib ([place]) 11/2012   PAF   Anxiety    takes Ativan   Asthma 04/07/2011   dx   Bipolar disorder ([place])    Cancer ([place])    Right breast   Depression    Early cataracts, bilateral    Fatty liver    Fibromyalgia    GERD (gastroesophageal reflux disease)    Irritable bowel syndrome    Mental disorder    dx bipolar   PONV (postoperative nausea and vomiting)    Sleep apnea 04/2011    does not wear CPAP  She has chronic sinus headaches, hiatal hernia   PAST SURGICAL HISTORY: Past Surgical History:  Procedure Laterality Date   ABDOMINAL HYSTERECTOMY     COLONOSCOPY     DILATION  AND CURETTAGE OF UTERUS  1981   abnormal pap   KNEE ARTHROSCOPY Left 2007   x 2   LUMBAR LAMINECTOMY/DECOMPRESSION MICRODISCECTOMY   05/31/2011   Procedure: LUMBAR LAMINECTOMY/DECOMPRESSION MICRODISCECTOMY;  Surgeon: Johnn Hai;  Location: [facility];  Service: Orthopedics;  Laterality: Left;  Decompression Lumbar four to five and  Lumbar five to Sacral one on Left  (X-Ray)   MASTECTOMY W/ SENTINEL NODE BIOPSY Right 09/16/2018   Procedure: RIGHT MASTECTOMY WITH RIGHT AXILLARY SENTINEL LYMPH NODE BIOPSY;  Surgeon: Alphonsa Overall, MD;  Location: [facility];  Service: General;  Laterality: Right;   PARTIAL HYSTERECTOMY  1982   PORTACATH PLACEMENT Left 10/31/2018   Procedure: INSERTION PORT-A-CATH WITH ULTRASOUND;  Surgeon: Alphonsa Overall, MD;  Location: [facility];  Service: General;  Laterality: Left;   TONSILLECTOMY     as child   TOTAL KNEE ARTHROPLASTY Left 12/18/2005    FAMILY HISTORY Family History  Problem Relation Age of Onset   Anesthesia problems Mother    Heart disease Mother        CHF, atrial fib   Heart failure Mother    Anesthesia problems Sister    Colon polyps Father    Heart disease Father        "Fluid around the heart"   Hypertension Sister    Breast cancer Maternal Aunt    Colon cancer Maternal Aunt    Prostate cancer Neg Hx    Ovarian cancer Neg Hx   As of February 2019, patient father is alive at 6 years old. Patient mother died at age 23 in 06/18/2020. She notes a family hx of breast cancer. A maternal aunt was diagnosed with breast cancer, but Doris Smith is unsure if it was in one or both breasts. She has 3 siblings, 3 sisters and 0 brothers.   GYNECOLOGIC HISTORY:  No LMP recorded. Patient is postmenopausal. Menarche: 71 years old Age at first live birth: 71 years old [place] P 2 LMP about 42 years ago Contraceptive no HRT no  Hysterectomy? partial So? no   SOCIAL HISTORY:  Doris Smith worked in [employer] for 15 years. She is on disability because her back, knees, and nerves. Her husband, Doris Smith, has been at [employer] 43 years as a Freight forwarder.  Even though their home is in [place]  they are actually living in [place] in an apartment while her husband works there.  Son Doris Smith, age 40, works as a Chief Strategy Officer in [place], [place]. Son Doris Smith, age 45, works as a Building control surveyor in [place], [place]. They have 4 grandchildren, 2 great-grandchildren with 2 more on the way. She attends a [employer].   ADVANCED DIRECTIVES: In the absence of any documents to the contrary the patient's husband is her healthcare power of attorney   HEALTH MAINTENANCE: Social History   Tobacco Use   Smoking status: Never   Smokeless tobacco: Never  Vaping Use   Vaping Use: Never used  Substance Use Topics   Alcohol use: No   Drug use: No     Colonoscopy: 2019  PAP: 11/03/2014, normal  Bone density: 10/13/2016, T-score of -2.1   Allergies  Allergen Reactions   Iohexol     "over 20 years ago" had reaction "hurting in arm and heart"-Done in [place], [place].Marland Kitchenphysician stopped CT at that time.   Codeine Other (See Comments)    Makes feel like she is wild    Palonosetron Other (See Comments)    headache   Prednisone Other (See Comments)  Makes me very irritable   Sulfonamide Derivatives Other (See Comments)    Upset stomach   Celecoxib Nausea And Vomiting   Sulfa Antibiotics Nausea And Vomiting   Vitamin B12 Rash    Current Outpatient Medications  Medication Sig Dispense Refill   anastrozole (ARIMIDEX) 1 MG tablet Take 1 tablet (1 mg total) by mouth daily. 90 tablet 4   apixaban (ELIQUIS) 5 MG TABS tablet Take 1 tablet (5 mg total) by mouth 2 (two) times daily. 180 tablet 1   clotrimazole-betamethasone (LOTRISONE) cream APPLY  CREAM TOPICALLY TWICE DAILY 45 g 0   esomeprazole (NEXIUM) 40 MG capsule Take 1 capsule (40 mg total) by mouth daily. 90 capsule 1   fluticasone (FLONASE) 50 MCG/ACT nasal spray Place 2 sprays into both nostrils daily. 16 g 6   furosemide (LASIX) 20 MG tablet Take 1 tablet (20 mg total) by mouth daily. 30 tablet 3   gabapentin (NEURONTIN) 300 MG capsule Take 1 capsule (300 mg  total) by mouth 3 (three) times daily. 90 capsule 5   loratadine (CLARITIN) 10 MG tablet Take 10 mg by mouth daily.     Lurasidone HCl (LATUDA) 60 MG TABS TAKE 1 TABLET BY MOUTH ONCE DAILY 90 tablet 1   metoprolol tartrate (LOPRESSOR) 25 MG tablet Take 1 tablet (25 mg total) by mouth 2 (two) times daily. 180 tablet 1   midodrine (PROAMATINE) 2.5 MG tablet Take 1 tablet (2.5 mg total) by mouth 3 (three) times daily with meals. 270 tablet 3   pramipexole (MIRAPEX) 1 MG tablet TAKE 1 TABLET BY MOUTH THREE TIMES DAILY 90 tablet 0   prochlorperazine (COMPAZINE) 10 MG tablet Take 1 tablet by mouth daily.     tizanidine (ZANAFLEX) 2 MG capsule Take 2 mg by mouth 3 (three) times daily as needed.     triamcinolone (KENALOG) 0.1 % Apply 1 application. topically 2 (two) times daily.     Vilazodone HCl (VIIBRYD) 40 MG TABS Take 1 tablet (40 mg total) by mouth daily. 90 tablet 1   No current facility-administered medications for this visit.   Facility-Administered Medications Ordered in Other Visits  Medication Dose Route Frequency Provider Last Rate Last Admin   heparin lock flush 100 unit/mL  500 Units Intravenous Once Magrinat, Virgie Dad, MD       sodium chloride flush (NS) 0.9 % injection 10 mL  10 mL Intravenous PRN Magrinat, Virgie Dad, MD        OBJECTIVE: white woman who appears stated age  9:   05/15/22 0925  BP: (!) 137/51  Pulse: 64  Resp: 18  Temp: 97.8 F (36.6 C)  SpO2: 99%   Wt Readings from Last 3 Encounters:  05/15/22 243 lb 9 oz (110.5 kg)  02/16/22 244 lb (110.7 kg)  01/30/22 240 lb (108.9 kg)   Body mass index is 41.81 kg/m.    ECOG FS:2 - Symptomatic, <50% confined to bed  Physical Exam Constitutional:      Appearance: Normal appearance.  Chest:     Comments: Right breast s/p mastectomy. No evidence of local recurrence or regional adenopathy.  Left breast normal to inspection and palpation.  No palpable masses or regional adenopathy.  The point of tenderness that  patient explains is in the left rib below the breast. Musculoskeletal:     Cervical back: No rigidity.  Lymphadenopathy:     Cervical: No cervical adenopathy.  Neurological:     Mental Status: She is alert.  LAB RESULTS:  CMP     Component Value Date/Time   NA 141 05/15/2022 0851   NA 143 11/14/2021 1058   K 4.3 05/15/2022 0851   CL 106 05/15/2022 0851   CO2 29 05/15/2022 0851   GLUCOSE 147 (H) 05/15/2022 0851   BUN 13 05/15/2022 0851   BUN 13 11/14/2021 1058   CREATININE 1.07 (H) 05/15/2022 0851   CREATININE 0.90 12/19/2018 1434   CALCIUM 9.1 05/15/2022 0851   PROT 6.7 05/15/2022 0851   PROT 6.7 11/14/2021 1058   ALBUMIN 3.7 05/15/2022 0851   ALBUMIN 3.8 11/14/2021 1058   AST 19 05/15/2022 0851   AST 14 (L) 08/14/2018 0758   ALT 17 05/15/2022 0851   ALT 10 08/14/2018 0758   ALKPHOS 107 05/15/2022 0851   BILITOT 0.6 05/15/2022 0851   BILITOT 0.4 11/14/2021 1058   BILITOT 0.5 08/14/2018 0758   GFRNONAA 56 (L) 05/15/2022 0851   GFRNONAA >60 12/19/2018 1434   GFRAA 81 02/03/2020 1049   GFRAA >60 12/19/2018 1434    No results found for: "TOTALPROTELP", "ALBUMINELP", "A1GS", "A2GS", "BETS", "BETA2SER", "GAMS", "MSPIKE", "SPEI"  No results found for: "KPAFRELGTCHN", "LAMBDASER", "KAPLAMBRATIO"  Lab Results  Component Value Date   WBC 6.3 05/15/2022   NEUTROABS 4.7 05/15/2022   HGB 11.2 (L) 05/15/2022   HCT 35.0 (L) 05/15/2022   MCV 88.6 05/15/2022   PLT 222 05/15/2022   No results found for: "LABCA2"  No components found for: "MEQAST419"  No results for input(s): "INR" in the last 168 hours.  No results found for: "LABCA2"  No results found for: "QQI297"  No results found for: "CAN125"  No results found for: "CAN153"  No results found for: "CA2729"  No components found for: "HGQUANT"  No results found for: "CEA1", "CEA" / No results found for: "CEA1", "CEA"   No results found for: "AFPTUMOR"  No results found for: "CHROMOGRNA"  No  results found for: "HGBA", "HGBA2QUANT", "HGBFQUANT", "HGBSQUAN" (Hemoglobinopathy evaluation)   No results found for: "LDH"  No results found for: "IRON", "TIBC", "IRONPCTSAT" (Iron and TIBC)  No results found for: "FERRITIN"  Urinalysis    Component Value Date/Time   COLORURINE YELLOW 12/19/2018 1355   APPEARANCEUR Clear 02/03/2020 1045   LABSPEC 1.016 12/19/2018 1355   PHURINE 5.0 12/19/2018 1355   GLUCOSEU Negative 02/03/2020 1045   HGBUR NEGATIVE 12/19/2018 1355   BILIRUBINUR Negative 02/03/2020 [place] 12/19/2018 1355   PROTEINUR Negative 02/03/2020 1045   PROTEINUR NEGATIVE 12/19/2018 1355   UROBILINOGEN negative 11/03/2014 1039   UROBILINOGEN 0.2 05/25/2011 0923   NITRITE Negative 02/03/2020 1045   NITRITE NEGATIVE 12/19/2018 1355   LEUKOCYTESUR Negative 02/03/2020 1045   LEUKOCYTESUR SMALL (A) 12/19/2018 1355    STUDIES: No results found.    ELIGIBLE FOR AVAILABLE RESEARCH PROTOCOL: no   ASSESSMENT: 71 y.o. [place], [place] woman status post right breast biopsy 08/02/2018 for a clinical T3 N0, stage IIA invasive lobular carcinoma, grade 2, estrogen receptor strongly positive, progesterone receptor 1% positive, with no HER-2 amplification and an MIB-1 of 1%.  (a) CT scan of the head and chest, without contrast 08/23/2018 showed nonspecific 0.4 cm left lower lobe pulmonary nodule, no definitive metastatic disease  (b) bone scan 08/23/2018 shows multiple spinal areas of abnormal uptake, but  (c) total spinal MRI 09/03/2018 finds bone scan findings to be secondary to degenerative disease, no evidence of metastatic disease.  (1) status post right mastectomy and sentinel lymph node sampling 09/16/2018 showing a  pT3 pN1(mic), stage IIA-IIIA invasive lobular breast cancer, grade 2, with 1 of 5 sampled lymph nodes involved by micrometastatic deposit, with extra nodular extension; ample margins  (2)  MammaPrint "high risk" suggests a 5-year metastasis free  survival of 93% with chemotherapy, with an absolute chemotherapy benefit in the greater than 12% range  (3) adjuvant radiation 12/31/2018-02/19/2019:  The right chest wall and regional nodes were treated to 50.4 Gy in 28 fractions followed by a 10 Gy boost over 5 fractions.   (4) anastrozole started neoadjuvantly (on 08/14/2018), discontinued 10/14/2018 in preparation for chemotherapy, resumed October 2020  (a) bone density 10/13/2016 showed osteopenia with a lowest score at -2.1  (b) ibandronate/Boniva started January 2021  (c) bone density 11/11/2020 shows a T score of -2.6  (5) [facility] testing on mastectomy sample (09/16/2018) showed stable MSI and proficient mismatch repair status, with a low mutational burden; BRCA 1 and 2 were not mutated, PI K3 was not mutated, ER B B2 was not mutated, and AKT 1 was not mutated.  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  Physical examination today with no concerns for breast cancer recurrence.  She is overdue for her mammogram, this has been ordered. I do not believe the musculoskeletal pain that she explains is related to her breast cancer.  We have encouraged her to keep an eye on this pain and call us back if it gets worse in the next 2 to 3 weeks.  She expressed understanding With regards to osteoporosis, she used to be on ibandronate but she stopped it a few months ago.  She is willing to go back on it.  She denies any new dental concerns hence have prescribed it again.  We have discussed the risk of osteonecrosis of jaw with  bisphosphonates. Encouraged staying active, vitamin D supplementation as tolerated She will return to clinic in 1 year or sooner as needed.  Total time spent: 30 minutes  *Total Encounter Time as defined by the Centers for Medicare and Medicaid Services includes, in addition to the face-to-face time of a patient visit (documented in the note above) non-face-to-face time: obtaining and reviewing outside history, ordering and reviewing medications, tests or procedures, care coordination (communications with other health care professionals or caregivers) and documentation in the medical record.

## 2022-05-18 ENCOUNTER — Ambulatory Visit: Payer: BC Managed Care – PPO | Admitting: Nurse Practitioner

## 2022-05-20 ENCOUNTER — Other Ambulatory Visit: Payer: Self-pay | Admitting: Nurse Practitioner

## 2022-05-20 DIAGNOSIS — I480 Paroxysmal atrial fibrillation: Secondary | ICD-10-CM

## 2022-05-23 DIAGNOSIS — F312 Bipolar disorder, current episode manic severe with psychotic features: Secondary | ICD-10-CM | POA: Diagnosis not present

## 2022-05-23 DIAGNOSIS — F410 Panic disorder [episodic paroxysmal anxiety] without agoraphobia: Secondary | ICD-10-CM | POA: Diagnosis not present

## 2022-05-23 DIAGNOSIS — F4310 Post-traumatic stress disorder, unspecified: Secondary | ICD-10-CM | POA: Diagnosis not present

## 2022-05-25 ENCOUNTER — Other Ambulatory Visit: Payer: Self-pay | Admitting: Nurse Practitioner

## 2022-05-25 DIAGNOSIS — G2581 Restless legs syndrome: Secondary | ICD-10-CM

## 2022-06-05 ENCOUNTER — Ambulatory Visit (INDEPENDENT_AMBULATORY_CARE_PROVIDER_SITE_OTHER): Payer: BC Managed Care – PPO

## 2022-06-05 ENCOUNTER — Encounter: Payer: Self-pay | Admitting: Nurse Practitioner

## 2022-06-05 ENCOUNTER — Other Ambulatory Visit: Payer: Self-pay | Admitting: Nurse Practitioner

## 2022-06-05 ENCOUNTER — Ambulatory Visit (INDEPENDENT_AMBULATORY_CARE_PROVIDER_SITE_OTHER): Payer: BC Managed Care – PPO | Admitting: Nurse Practitioner

## 2022-06-05 VITALS — BP 154/89 | HR 62 | Temp 98.2°F | Resp 20 | Ht 64.0 in | Wt 239.0 lb

## 2022-06-05 DIAGNOSIS — M5137 Other intervertebral disc degeneration, lumbosacral region: Secondary | ICD-10-CM

## 2022-06-05 DIAGNOSIS — K582 Mixed irritable bowel syndrome: Secondary | ICD-10-CM

## 2022-06-05 DIAGNOSIS — F3160 Bipolar disorder, current episode mixed, unspecified: Secondary | ICD-10-CM

## 2022-06-05 DIAGNOSIS — G2581 Restless legs syndrome: Secondary | ICD-10-CM | POA: Diagnosis not present

## 2022-06-05 DIAGNOSIS — R0781 Pleurodynia: Secondary | ICD-10-CM

## 2022-06-05 DIAGNOSIS — I700 Atherosclerosis of aorta: Secondary | ICD-10-CM | POA: Diagnosis not present

## 2022-06-05 DIAGNOSIS — I10 Essential (primary) hypertension: Secondary | ICD-10-CM | POA: Diagnosis not present

## 2022-06-05 DIAGNOSIS — K219 Gastro-esophageal reflux disease without esophagitis: Secondary | ICD-10-CM

## 2022-06-05 DIAGNOSIS — F418 Other specified anxiety disorders: Secondary | ICD-10-CM

## 2022-06-05 DIAGNOSIS — E669 Obesity, unspecified: Secondary | ICD-10-CM

## 2022-06-05 DIAGNOSIS — K449 Diaphragmatic hernia without obstruction or gangrene: Secondary | ICD-10-CM | POA: Diagnosis not present

## 2022-06-05 DIAGNOSIS — R0789 Other chest pain: Secondary | ICD-10-CM

## 2022-06-05 DIAGNOSIS — R609 Edema, unspecified: Secondary | ICD-10-CM

## 2022-06-05 DIAGNOSIS — I480 Paroxysmal atrial fibrillation: Secondary | ICD-10-CM | POA: Diagnosis not present

## 2022-06-05 DIAGNOSIS — R600 Localized edema: Secondary | ICD-10-CM

## 2022-06-05 DIAGNOSIS — F319 Bipolar disorder, unspecified: Secondary | ICD-10-CM | POA: Diagnosis not present

## 2022-06-05 DIAGNOSIS — C50411 Malignant neoplasm of upper-outer quadrant of right female breast: Secondary | ICD-10-CM

## 2022-06-05 DIAGNOSIS — Z170 Estrogen receptor positive status [ER+]: Secondary | ICD-10-CM

## 2022-06-05 DIAGNOSIS — M51379 Other intervertebral disc degeneration, lumbosacral region without mention of lumbar back pain or lower extremity pain: Secondary | ICD-10-CM

## 2022-06-05 LAB — LIPID PANEL
Chol/HDL Ratio: 3 ratio (ref 0.0–4.4)
Cholesterol, Total: 190 mg/dL (ref 100–199)
HDL: 63 mg/dL (ref >39)
LDL Chol Calc (NIH): 103 mg/dL — ABNORMAL HIGH (ref 0–99)
Triglycerides: 140 mg/dL (ref 0–149)
VLDL Cholesterol Cal: 24 mg/dL (ref 5–40)

## 2022-06-05 LAB — CBC WITH DIFFERENTIAL/PLATELET
Basophils Absolute: 0.1 10*3/uL (ref 0.0–0.2)
Basos: 1 %
EOS (ABSOLUTE): 0.1 10*3/uL (ref 0.0–0.4)
Eos: 1 %
Hematocrit: 37.4 % (ref 34.0–46.6)
Hemoglobin: 12 g/dL (ref 11.1–15.9)
Immature Grans (Abs): 0 10*3/uL (ref 0.0–0.1)
Immature Granulocytes: 0 %
Lymphocytes Absolute: 0.9 10*3/uL (ref 0.7–3.1)
Lymphs: 14 %
MCH: 27.7 pg (ref 26.6–33.0)
MCHC: 32.1 g/dL (ref 31.5–35.7)
MCV: 86 fL (ref 79–97)
Monocytes Absolute: 0.6 10*3/uL (ref 0.1–0.9)
Monocytes: 8 %
Neutrophils Absolute: 5.3 10*3/uL (ref 1.4–7.0)
Neutrophils: 76 %
Platelets: 251 10*3/uL (ref 150–450)
RBC: 4.33 x10E6/uL (ref 3.77–5.28)
RDW: 15.5 % — ABNORMAL HIGH (ref 11.7–15.4)
WBC: 6.9 10*3/uL (ref 3.4–10.8)

## 2022-06-05 LAB — CMP14+EGFR
ALT: 18 IU/L (ref 0–32)
AST: 24 IU/L (ref 0–40)
Albumin/Globulin Ratio: 1.5 (ref 1.2–2.2)
Albumin: 4 g/dL (ref 3.9–4.9)
Alkaline Phosphatase: 135 IU/L — ABNORMAL HIGH (ref 44–121)
BUN/Creatinine Ratio: 15 (ref 12–28)
BUN: 17 mg/dL (ref 8–27)
Bilirubin Total: 0.5 mg/dL (ref 0.0–1.2)
CO2: 23 mmol/L (ref 20–29)
Calcium: 9.9 mg/dL (ref 8.7–10.3)
Chloride: 104 mmol/L (ref 96–106)
Creatinine, Ser: 1.15 mg/dL — ABNORMAL HIGH (ref 0.57–1.00)
Globulin, Total: 2.6 g/dL (ref 1.5–4.5)
Glucose: 105 mg/dL — ABNORMAL HIGH (ref 70–99)
Potassium: 4.3 mmol/L (ref 3.5–5.2)
Sodium: 145 mmol/L — ABNORMAL HIGH (ref 134–144)
Total Protein: 6.6 g/dL (ref 6.0–8.5)
eGFR: 52 mL/min/{1.73_m2} — ABNORMAL LOW (ref >59)

## 2022-06-05 LAB — BAYER DCA HB A1C WAIVED: HB A1C (BAYER DCA - WAIVED): 6.5 % — ABNORMAL HIGH (ref 4.8–5.6)

## 2022-06-05 MED ORDER — VILAZODONE HCL 40 MG PO TABS: 40.0000 mg | ORAL_TABLET | Freq: Every day | ORAL | 1 refills | Status: DC

## 2022-06-05 MED ORDER — GABAPENTIN 300 MG PO CAPS: 300.0000 mg | ORAL_CAPSULE | Freq: Three times a day (TID) | ORAL | 5 refills | Status: DC

## 2022-06-05 MED ORDER — APIXABAN 5 MG PO TABS: 5.0000 mg | ORAL_TABLET | Freq: Two times a day (BID) | ORAL | 1 refills | Status: DC

## 2022-06-05 MED ORDER — ESOMEPRAZOLE MAGNESIUM 40 MG PO CPDR: 40.0000 mg | DELAYED_RELEASE_CAPSULE | Freq: Every day | ORAL | 1 refills | Status: DC

## 2022-06-05 MED ORDER — ANASTROZOLE 1 MG PO TABS: 1.0000 mg | ORAL_TABLET | Freq: Every day | ORAL | 4 refills | Status: DC

## 2022-06-05 MED ORDER — METOPROLOL TARTRATE 25 MG PO TABS: 25.0000 mg | ORAL_TABLET | Freq: Two times a day (BID) | ORAL | 1 refills | Status: DC

## 2022-06-05 MED ORDER — LURASIDONE HCL 60 MG PO TABS: ORAL_TABLET | ORAL | 1 refills | Status: DC

## 2022-06-05 MED ORDER — PRAMIPEXOLE DIHYDROCHLORIDE 1 MG PO TABS: 1.0000 mg | ORAL_TABLET | Freq: Three times a day (TID) | ORAL | 0 refills | Status: DC

## 2022-06-05 MED ORDER — FUROSEMIDE 20 MG PO TABS: 20.0000 mg | ORAL_TABLET | Freq: Every day | ORAL | 1 refills | Status: DC

## 2022-06-05 NOTE — Progress Notes (Signed)
Subjective:    Patient ID: Doris Smith, female    DOB: May 18, 1951, 71 y.o.   MRN: [id number]   Chief Complaint: medical management of chronic issues   HPI:  Doris Smith is a 71 y.o. who identifies as a female who was assigned female at birth.   Social history: Lives with: husband Work history: retired   Scientist, forensic in today for follow up of the following chronic medical issues:  1. Essential hypertension No c/o chest pain, sob, or headache. She does not check BP at home. BP Readings from Last 3 Encounters:  06/05/22 (!) 154/89  05/15/22 (!) 137/51  02/16/22 137/85     2. Paroxysmal atrial fibrillation (HCC) 3. Aortic atherosclerosis (HCC) No c/o palpitations or heart racing. Sees cardiology; last visit 12/06/21; no changes made at that time.  4. Irritable bowel syndrome with both constipation and diarrhea Having regular bowel movements; no issues with diarrhea or constipation lately.  5. Gastroesophageal reflux disease without esophagitis Doing well on daily Nexium.  6. Bipolar affective disorder, remission status unspecified ([place]) 7. Anxiety associated with depression Continues on vibryd and latuda; works well for her, no adverse effects.     06/05/2022    9:40 AM 02/16/2022    9:20 AM 01/30/2022    2:03 PM 11/14/2021   10:31 AM  GAD 7 : Generalized Anxiety Score  Nervous, Anxious, on Edge _0 Control/stop worrying 0 0 2 1  Worry too much - different things 0 0 2 1  Trouble relaxing _1 Restless _2 Easily annoyed or irritable _3 Afraid - awful might happen 0 0 1 0  Total GAD 7 Score _4 Anxiety Difficulty Somewhat difficult Somewhat difficult Very difficult Not difficult at all        06/05/2022    9:39 AM 02/16/2022    9:20 AM 01/30/2022    2:03 PM 11/14/2021   10:31 AM 10/03/2021    2:23 PM  Depression screen PHQ 2/9  Decreased Interest _5 Down, Depressed, Hopeless _6 PHQ - 2 Score _7 Altered sleeping _8 Tired, decreased energy _9 Change in appetite _10 0  Feeling bad or failure about yourself  1 0 2 0 0  Trouble concentrating _11 Moving slowly or fidgety/restless _12 0 0  Suicidal thoughts 0 0 0 0 0  PHQ-9 Score _13 Difficult doing work/chores Somewhat difficult Not difficult at all Not difficult at all Not difficult at all Not difficult at all     8. Restless leg syndrome Takes Mirapex daily which does help some.  9. Malignant neoplasm of upper-outer quadrant of right breast in female, estrogen receptor positive ([place]) Followed by oncology; last visit earlier this month. Mammogram scheduled.  10. DEGENERATIVE DISC DISEASE, LUMBOSACRAL SPINE Takes her zanaflex at bedtime which helps.  11. Obesity (BMI 30-39.9) Weight is down 4lbs since last check. Wt Readings from Last 3 Encounters:  06/05/22 239 lb (108.4 kg)  05/15/22 243 lb 9 oz (110.5 kg)  02/16/22 244 lb (110.7 kg)   BMI Readings from Last 3 Encounters:  06/05/22 41.02 kg/m  05/15/22 41.81 kg/m  02/16/22 41.88 kg/m     New complaints: C/o  L side rib pain x 1 month; has been using heating pad for relief. No injury she is aware of. Also wants her A1c checked today due to family history of DM  Allergies  Allergen Reactions   Iohexol     "over 20 years ago" had reaction "hurting in arm and heart"-Done in [place], [place].Marland Kitchenphysician stopped CT at that time.   Codeine Other (See Comments)    Makes feel like she is wild    Palonosetron Other (See Comments)    headache   Prednisone Other (See Comments)    Makes me very irritable   Sulfonamide Derivatives Other (See Comments)    Upset stomach   Celecoxib Nausea And Vomiting   Sulfa Antibiotics Nausea And Vomiting   Vitamin B12 Rash   Outpatient Encounter Medications as of 06/05/2022  Medication Sig   anastrozole (ARIMIDEX) 1 MG tablet Take 1 tablet (1 mg total) by mouth daily.   apixaban (ELIQUIS) 5 MG  TABS tablet Take 1 tablet (5 mg total) by mouth 2 (two) times daily.   clotrimazole-betamethasone (LOTRISONE) cream APPLY  CREAM TOPICALLY TWICE DAILY   esomeprazole (NEXIUM) 40 MG capsule Take 1 capsule (40 mg total) by mouth daily.   fluticasone (FLONASE) 50 MCG/ACT nasal spray Place 2 sprays into both nostrils daily.   furosemide (LASIX) 20 MG tablet Take 1 tablet (20 mg total) by mouth daily.   gabapentin (NEURONTIN) 300 MG capsule Take 1 capsule (300 mg total) by mouth 3 (three) times daily.   ibandronate (BONIVA) 150 MG tablet Take 1 tablet (150 mg total) by mouth every 30 (thirty) days. Take in the morning with a full glass of water, on an empty stomach, and do not take anything else by mouth or lie down for the next 30 min.   loratadine (CLARITIN) 10 MG tablet Take 10 mg by mouth daily.   Lurasidone HCl (LATUDA) 60 MG TABS TAKE 1 TABLET BY MOUTH ONCE DAILY   metoprolol tartrate (LOPRESSOR) 25 MG tablet Take 1 tablet by mouth twice daily   midodrine (PROAMATINE) 2.5 MG tablet Take 1 tablet (2.5 mg total) by mouth 3 (three) times daily with meals.   pramipexole (MIRAPEX) 1 MG tablet TAKE 1 TABLET BY MOUTH THREE TIMES DAILY   prochlorperazine (COMPAZINE) 10 MG tablet Take 1 tablet by mouth daily.   tizanidine (ZANAFLEX) 2 MG capsule Take 2 mg by mouth 3 (three) times daily as needed.   triamcinolone (KENALOG) 0.1 % Apply 1 application. topically 2 (two) times daily.   Vilazodone HCl (VIIBRYD) 40 MG TABS Take 1 tablet (40 mg total) by mouth daily.   Facility-Administered Encounter Medications as of 06/05/2022  Medication   heparin lock flush 100 unit/mL   sodium chloride flush (NS) 0.9 % injection 10 mL    Past Surgical History:  Procedure Laterality Date   ABDOMINAL HYSTERECTOMY     COLONOSCOPY     DILATION AND CURETTAGE OF UTERUS  1981   abnormal pap   KNEE ARTHROSCOPY Left 2007   x 2   LUMBAR LAMINECTOMY/DECOMPRESSION MICRODISCECTOMY  05/31/2011   Procedure: LUMBAR  LAMINECTOMY/DECOMPRESSION MICRODISCECTOMY;  Surgeon: Johnn Hai;  Location: [facility];  Service: Orthopedics;  Laterality: Left;  Decompression Lumbar four to five and  Lumbar five to Sacral one on Left  (X-Ray)   MASTECTOMY W/ SENTINEL NODE BIOPSY Right 09/16/2018   Procedure: RIGHT MASTECTOMY WITH RIGHT AXILLARY SENTINEL LYMPH NODE BIOPSY;  Surgeon: Alphonsa Overall, MD;  Location: [facility];  Service: General;  Laterality:  Right;   PARTIAL HYSTERECTOMY  1982   PORTACATH PLACEMENT Left 10/31/2018   Procedure: INSERTION PORT-A-CATH WITH ULTRASOUND;  Surgeon: Alphonsa Overall, MD;  Location: [facility];  Service: General;  Laterality: Left;   TONSILLECTOMY     as child   TOTAL KNEE ARTHROPLASTY Left 12/18/2005    Family History  Problem Relation Age of Onset   Anesthesia problems Mother    Heart disease Mother        CHF, atrial fib   Heart failure Mother    Anesthesia problems Sister    Colon polyps Father    Heart disease Father        "Fluid around the heart"   Hypertension Sister    Breast cancer Maternal Aunt    Colon cancer Maternal Aunt    Prostate cancer Neg Hx    Ovarian cancer Neg Hx       Controlled substance contract: n/a     Review of Systems  Constitutional:  Negative for fever.  HENT:  Positive for congestion.   Eyes:  Negative for pain.  Respiratory:  Negative for chest tightness and shortness of breath.   Cardiovascular:  Negative for chest pain, palpitations and leg swelling.  Gastrointestinal:  Negative for abdominal pain.  Endocrine: Negative for polydipsia and polyphagia.  Genitourinary:  Negative for dysuria.  Musculoskeletal:  Positive for arthralgias and back pain.  Skin:  Negative for rash.  Neurological:  Negative for dizziness, weakness and headaches.  Hematological:  Negative for adenopathy. Bruises/bleeds easily.       On Eliquis  All other systems reviewed and are negative.      Objective:   Physical Exam Vitals and nursing note  reviewed.  Constitutional:      General: She is not in acute distress.    Appearance: Normal appearance.  HENT:     Head: Normocephalic and atraumatic.     Right Ear: Tympanic membrane normal.     Left Ear: Tympanic membrane normal.     Nose: Nose normal.     Mouth/Throat:     Mouth: Mucous membranes are moist.     Pharynx: Oropharynx is clear.  Eyes:     Conjunctiva/sclera: Conjunctivae normal.     Pupils: Pupils are equal, round, and reactive to light.  Neck:     Vascular: No carotid bruit or JVD.  Cardiovascular:     Rate and Rhythm: Normal rate and regular rhythm.     Heart sounds: Normal heart sounds. No murmur heard. Pulmonary:     Effort: Pulmonary effort is normal. No respiratory distress.     Breath sounds: Normal breath sounds. No wheezing, rhonchi or rales.     Comments: L side anterolateral ribs tender to palpation Chest:     Chest wall: Tenderness present. No mass, deformity, swelling, crepitus or edema.     Comments: L side rib tenderness Musculoskeletal:        General: No swelling.     Cervical back: Normal range of motion. No tenderness.     Comments: Ambulates with a cane  Lymphadenopathy:     Cervical: No cervical adenopathy.  Skin:    General: Skin is warm and dry.     Capillary Refill: Capillary refill takes less than 2 seconds.  Neurological:     General: No focal deficit present.     Mental Status: She is alert and oriented to person, place, and time.  Psychiatric:        Mood and Affect:  Mood normal.        Behavior: Behavior normal.     BP (!) 154/89   Pulse 62   Temp 98.2 F (36.8 C) (Temporal)   Resp 20   Ht _0  (1.626 m)   Wt 239 lb (108.4 kg)   SpO2 99%   BMI 41.02 kg/m        Assessment & Plan:   Doris Smith comes in today with chief complaint of Medical Management of Chronic Issues and Pain in left rib   Diagnosis and orders addressed:  1. Essential hypertension Low sodium diet. - CBC with  Differential/Platelet - CMP14+EGFR - Lipid panel - Bayer DCA Hb A1c Waived  2. Paroxysmal atrial fibrillation (HCC) 3. Aortic atherosclerosis ([place]) Keep follow up with cardiology. Report new or worsening symptoms. - metoprolol tartrate (LOPRESSOR) 25 MG tablet; Take 1 tablet (25 mg total) by mouth 2 (two) times daily.  Dispense: 180 tablet; Refill: 1 - apixaban (ELIQUIS) 5 MG TABS tablet; Take 1 tablet (5 mg total) by mouth 2 (two) times daily.  Dispense: 180 tablet; Refill: 1   4. Irritable bowel syndrome with both constipation and diarrhea Avoid triggers.  5. Gastroesophageal reflux disease without esophagitis Avoid spicy foods. Avoid eating 2 hours before bed - esomeprazole (NEXIUM) 40 MG capsule; Take 1 capsule (40 mg total) by mouth daily.  Dispense: 90 capsule; Refill: 1  6. Bipolar affective disorder, remission status unspecified ([place]) Stress management. - Lurasidone HCl (LATUDA) 60 MG TABS; TAKE 1 TABLET BY MOUTH ONCE DAILY  Dispense: 90 tablet; Refill: 1  7. Obesity (BMI 30-39.9) Discussed diet and exercise for pt with BMI > 25 Will recheck weight in 3-6 months  8. Anxiety associated with depression Stress management  9. Restless leg syndrome - pramipexole (MIRAPEX) 1 MG tablet; Take 1 tablet (1 mg total) by mouth 3 (three) times daily.  Dispense: 90 tablet; Refill: 0  10. Malignant neoplasm of upper-outer quadrant of right breast in female, estrogen receptor positive ([place]) Get scheduled mammogram - anastrozole (ARIMIDEX) 1 MG tablet; Take 1 tablet (1 mg total) by mouth daily.  Dispense: 90 tablet; Refill: 4  11. DEGENERATIVE DISC DISEASE, LUMBOSACRAL SPINE - gabapentin (NEURONTIN) 300 MG capsule; Take 1 capsule (300 mg total) by mouth 3 (three) times daily.  Dispense: 90 capsule; Refill: 5  12. Rib pain on left side Rib fracture. Cough and deep breathe Splint when coughing Continue heat PRN pain - DG Chest 2 View  13. Bipolar affective disorder, current  episode mixed, current episode severity unspecified (HCC) Stress management - Vilazodone HCl (VIIBRYD) 40 MG TABS; Take 1 tablet (40 mg total) by mouth daily.  Dispense: 90 tablet; Refill: 1  14. Peripheral edema Keep legs elevated when sitting - furosemide (LASIX) 20 MG tablet; Take 1 tablet (20 mg total) by mouth daily.  Dispense: 90 tablet; Refill: 1   Labs pending Health Maintenance reviewed Diet and exercise encouraged  Follow up plan: 6 months  Collene Leyden, FNP student  [place], [place]

## 2022-06-05 NOTE — Patient Instructions (Signed)
Rib Fracture  A rib fracture is a break or crack in one of the bones of the ribs. The ribs are long, curved bones that wrap around your chest and attach to your spine and your breastbone. The ribs protect your heart, lungs, and other organs in the chest. A broken or cracked rib is often painful but is not usually serious. Most rib fractures heal on their own over time. However, rib fractures can be more serious if multiple ribs are broken or if broken ribs move out of place and push against other structures or organs. What are the causes? This condition is caused by: Repetitive movements with high force, such as pitching a baseball or having very bad coughing spells. A direct hit the chest, such as a sports injury, a car crash, or a fall. Cancer that has spread to the bones, which can weaken bones and cause them to break. What are the signs or symptoms? Symptoms of this condition include: Pain when you breathe in or cough. Pain when someone presses on the injured area. Feeling short of breath. How is this diagnosed? This condition is diagnosed with a physical exam and medical history. You may also have imaging tests, such as: Chest X-ray. CT scan. MRI. Bone scan. Chest ultrasound. How is this treated? Treatment for this condition depends on the severity of the fracture. Most rib fractures usually heal on their own in 1-3 months. Healing may take longer if you have a cough or if you do activities that make the injury worse. While you heal, you may be given medicines to control the pain. You will also be taught deep breathing exercises. Severe injuries may require hospitalization or surgery. Follow these instructions at home: Managing pain, stiffness, and swelling If directed, put ice on the injured area. To do this: Put ice in a plastic bag. Place a towel between your skin and the bag. Leave the ice on for 20 minutes, 2-3 times a day. Remove the ice if your skin turns bright red. This is  very important. If you cannot feel pain, heat, or cold, you have a greater risk of damage to the area. Take over-the-counter and prescription medicines only as told by your health care provider. Activity Avoid doing activities or movements that cause pain. Be careful during activities and avoid bumping the injured rib. Slowly increase your activity as told by your health care provider. General instructions Do deep breathing exercises as told by your health care provider. This helps prevent pneumonia, which is a common complication of a broken rib. Your health care provider may instruct you to: Take deep breaths several times a day. Try to cough several times a day, holding a pillow against the injured area. Use a device called incentive spirometer to practice deep breathing several times a day. Drink enough fluid to keep your urine pale yellow. Do not wear a rib belt or binder. These restrict breathing, which can lead to pneumonia. Keep all follow-up visits. This is important. Contact a health care provider if: You have a fever. Get help right away if: You have difficulty breathing or you are short of breath. You develop a cough that does not stop, or you cough up thick or bloody sputum. You have nausea, vomiting, or pain in your abdomen. Your pain gets worse and medicine does not help. These symptoms may represent a serious problem that is an emergency. Do not wait to see if the symptoms will go away. Get medical help right away. Call   your local emergency services (911 in the [place] Do not drive yourself to the hospital. Summary A rib fracture is a break or crack in one of the bones of the ribs. A broken or cracked rib is often painful but is not usually serious. Most rib fractures heal on their own over time. Treatment for this condition depends on the severity of the fracture. Avoid doing any activities or movements that cause pain. This information is not intended to replace advice  given to you by your health care provider. Make sure you discuss any questions you have with your health care provider. Document Revised: 10/17/2019 Document Reviewed: 10/17/2019 Elsevier Patient Education  2023 Elsevier Inc.  

## 2022-06-12 ENCOUNTER — Telehealth: Payer: Self-pay | Admitting: Nurse Practitioner

## 2022-06-12 NOTE — Telephone Encounter (Signed)
Patient aware of lab results.

## 2022-06-12 NOTE — Telephone Encounter (Signed)
Patient would like to speak to nurse about lab results from 11/27. Please call back

## 2022-06-16 ENCOUNTER — Telehealth (INDEPENDENT_AMBULATORY_CARE_PROVIDER_SITE_OTHER): Payer: BC Managed Care – PPO | Admitting: Family Medicine

## 2022-06-16 ENCOUNTER — Telehealth: Payer: Self-pay | Admitting: Nurse Practitioner

## 2022-06-16 NOTE — Telephone Encounter (Signed)
Appt made for Monday with PCP

## 2022-06-16 NOTE — Telephone Encounter (Signed)
Elevate and ice for the next several days. Would be highly unusual to have infection in both feet at same time.

## 2022-06-16 NOTE — Telephone Encounter (Signed)
Please review. Do you want her to have an appt?

## 2022-06-16 NOTE — Progress Notes (Signed)
Will be seen in office on Monday, no charge for today

## 2022-06-19 ENCOUNTER — Ambulatory Visit (INDEPENDENT_AMBULATORY_CARE_PROVIDER_SITE_OTHER): Payer: BC Managed Care – PPO | Admitting: Nurse Practitioner

## 2022-06-19 ENCOUNTER — Encounter: Payer: Self-pay | Admitting: Nurse Practitioner

## 2022-06-19 VITALS — BP 142/83 | HR 76 | Temp 97.7°F | Resp 20 | Ht 64.0 in | Wt 244.0 lb

## 2022-06-19 DIAGNOSIS — R609 Edema, unspecified: Secondary | ICD-10-CM | POA: Diagnosis not present

## 2022-06-19 NOTE — Progress Notes (Signed)
   Subjective:    Patient ID: Doris Smith, female    DOB: [birth year], 71 y.o.   MRN: [id number]   Chief Complaint: bilateral legs and feet swelling   Pt here today for c/o ongoing bilateral leg swelling and redness; states they were warm to touch a couple of nights ago. She has been keeping them elevated, but says they still flare up. Denies any pain, fever, chills.        Review of Systems  Constitutional:  Negative for fever.  Respiratory:  Negative for chest tightness and shortness of breath.   Cardiovascular:  Positive for leg swelling. Negative for chest pain and palpitations.  Skin:  Negative for rash.  Neurological:  Negative for numbness.  All other systems reviewed and are negative.      Objective:   Physical Exam Vitals and nursing note reviewed.  Constitutional:      General: She is not in acute distress.    Appearance: Normal appearance. She is well-developed and well-groomed.  Cardiovascular:     Rate and Rhythm: Normal rate and regular rhythm.     Pulses: Normal pulses.     Heart sounds: Normal heart sounds.  Pulmonary:     Effort: Pulmonary effort is normal. No respiratory distress.     Breath sounds: Normal breath sounds. No wheezing, rhonchi or rales.  Musculoskeletal:     Right lower leg: 2+ Edema present.     Left lower leg: 1+ Edema present.  Skin:    General: Skin is warm and dry.     Capillary Refill: Capillary refill takes less than 2 seconds.     Findings: No erythema or rash.  Neurological:     General: No focal deficit present.     Mental Status: She is alert and oriented to person, place, and time.  Psychiatric:        Mood and Affect: Mood normal.        Behavior: Behavior normal.       BP (!) 142/83   Pulse 76   Temp 97.7 F (36.5 C) (Temporal)   Resp 20   Ht '5\' 4"'$  (1.626 m)   Wt 244 lb (110.7 kg)   SpO2 96%   BMI 41.88 kg/m       Assessment & Plan:   Doris Smith in today with chief complaint of bilateral  legs and feet swelling   1. Peripheral edema Compression hose daily Elevate legs when sitting RTO prn    The above assessment and management plan was discussed with the patient. The patient verbalized understanding of and has agreed to the management plan. Patient is aware to call the clinic if symptoms persist or worsen. Patient is aware when to return to the clinic for a follow-up visit. Patient educated on when it is appropriate to go to the emergency department.   [name] Done, FNP

## 2022-06-20 DIAGNOSIS — F4310 Post-traumatic stress disorder, unspecified: Secondary | ICD-10-CM | POA: Diagnosis not present

## 2022-06-20 DIAGNOSIS — F312 Bipolar disorder, current episode manic severe with psychotic features: Secondary | ICD-10-CM | POA: Diagnosis not present

## 2022-06-20 DIAGNOSIS — F410 Panic disorder [episodic paroxysmal anxiety] without agoraphobia: Secondary | ICD-10-CM | POA: Diagnosis not present

## 2022-06-21 ENCOUNTER — Telehealth: Payer: BC Managed Care – PPO | Admitting: Family Medicine

## 2022-06-30 DIAGNOSIS — M1711 Unilateral primary osteoarthritis, right knee: Secondary | ICD-10-CM | POA: Diagnosis not present

## 2022-06-30 DIAGNOSIS — Z6841 Body Mass Index (BMI) 40.0 and over, adult: Secondary | ICD-10-CM | POA: Diagnosis not present

## 2022-07-05 ENCOUNTER — Ambulatory Visit (INDEPENDENT_AMBULATORY_CARE_PROVIDER_SITE_OTHER): Payer: BC Managed Care – PPO

## 2022-07-05 ENCOUNTER — Encounter: Payer: Self-pay | Admitting: Nurse Practitioner

## 2022-07-05 ENCOUNTER — Ambulatory Visit (INDEPENDENT_AMBULATORY_CARE_PROVIDER_SITE_OTHER): Payer: BC Managed Care – PPO | Admitting: Nurse Practitioner

## 2022-07-05 VITALS — BP 142/77 | HR 71 | Temp 97.8°F | Resp 20 | Ht 64.0 in | Wt 238.0 lb

## 2022-07-05 DIAGNOSIS — R109 Unspecified abdominal pain: Secondary | ICD-10-CM

## 2022-07-05 DIAGNOSIS — R0789 Other chest pain: Secondary | ICD-10-CM | POA: Diagnosis not present

## 2022-07-05 NOTE — Patient Instructions (Signed)
Rib Fracture  A rib fracture is a break or crack in one of the bones of the ribs. The ribs are long, curved bones that wrap around your chest and attach to your spine and your breastbone. The ribs protect your heart, lungs, and other organs in the chest. A broken or cracked rib is often painful but is not usually serious. Most rib fractures heal on their own over time. However, rib fractures can be more serious if multiple ribs are broken or if broken ribs move out of place and push against other structures or organs. What are the causes? This condition is caused by: Repetitive movements with high force, such as pitching a baseball or having very bad coughing spells. A direct hit the chest, such as a sports injury, a car crash, or a fall. Cancer that has spread to the bones, which can weaken bones and cause them to break. What are the signs or symptoms? Symptoms of this condition include: Pain when you breathe in or cough. Pain when someone presses on the injured area. Feeling short of breath. How is this diagnosed? This condition is diagnosed with a physical exam and medical history. You may also have imaging tests, such as: Chest X-ray. CT scan. MRI. Bone scan. Chest ultrasound. How is this treated? Treatment for this condition depends on the severity of the fracture. Most rib fractures usually heal on their own in 1-3 months. Healing may take longer if you have a cough or if you do activities that make the injury worse. While you heal, you may be given medicines to control the pain. You will also be taught deep breathing exercises. Severe injuries may require hospitalization or surgery. Follow these instructions at home: Managing pain, stiffness, and swelling If directed, put ice on the injured area. To do this: Put ice in a plastic bag. Place a towel between your skin and the bag. Leave the ice on for 20 minutes, 2-3 times a day. Remove the ice if your skin turns bright red. This is  very important. If you cannot feel pain, heat, or cold, you have a greater risk of damage to the area. Take over-the-counter and prescription medicines only as told by your health care provider. Activity Avoid doing activities or movements that cause pain. Be careful during activities and avoid bumping the injured rib. Slowly increase your activity as told by your health care provider. General instructions Do deep breathing exercises as told by your health care provider. This helps prevent pneumonia, which is a common complication of a broken rib. Your health care provider may instruct you to: Take deep breaths several times a day. Try to cough several times a day, holding a pillow against the injured area. Use a device called incentive spirometer to practice deep breathing several times a day. Drink enough fluid to keep your urine pale yellow. Do not wear a rib belt or binder. These restrict breathing, which can lead to pneumonia. Keep all follow-up visits. This is important. Contact a health care provider if: You have a fever. Get help right away if: You have difficulty breathing or you are short of breath. You develop a cough that does not stop, or you cough up thick or bloody sputum. You have nausea, vomiting, or pain in your abdomen. Your pain gets worse and medicine does not help. These symptoms may represent a serious problem that is an emergency. Do not wait to see if the symptoms will go away. Get medical help right away. Call   your local emergency services (911 in the [place] Do not drive yourself to the hospital. Summary A rib fracture is a break or crack in one of the bones of the ribs. A broken or cracked rib is often painful but is not usually serious. Most rib fractures heal on their own over time. Treatment for this condition depends on the severity of the fracture. Avoid doing any activities or movements that cause pain. This information is not intended to replace advice  given to you by your health care provider. Make sure you discuss any questions you have with your health care provider. Document Revised: 10/17/2019 Document Reviewed: 10/17/2019 Elsevier Patient Education  2023 Elsevier Inc.  

## 2022-07-05 NOTE — Progress Notes (Signed)
   Subjective:    Patient ID: Doris Smith, female    DOB: 10-May-1951, 71 y.o.   MRN: [id number]  Chest Pain     Patient had a bad coughing spell after overeating several days ago. She thinks she broke a rib when coughing. Now she is having pain along right upper flank.  Review of Systems  Cardiovascular:  Positive for chest pain.  All other systems reviewed and are negative.      Objective:   Physical Exam Vitals and nursing note reviewed.  Constitutional:      General: She is not in acute distress.    Appearance: Normal appearance. She is well-developed.  Neck:     Vascular: No carotid bruit or JVD.  Cardiovascular:     Rate and Rhythm: Normal rate and regular rhythm.     Heart sounds: Normal heart sounds.  Pulmonary:     Effort: Pulmonary effort is normal. No respiratory distress.     Breath sounds: Normal breath sounds. No wheezing or rales.  Chest:     Chest wall: No tenderness.  Abdominal:     General: Bowel sounds are normal. There is no distension or abdominal bruit.     Palpations: Abdomen is soft. There is no hepatomegaly, splenomegaly, mass or pulsatile mass.     Tenderness: There is no abdominal tenderness.  Musculoskeletal:        General: Normal range of motion.     Cervical back: Normal range of motion and neck supple.  Lymphadenopathy:     Cervical: No cervical adenopathy.  Skin:    General: Skin is warm and dry.  Neurological:     Mental Status: She is alert and oriented to person, place, and time.     Deep Tendon Reflexes: Reflexes are normal and symmetric.  Psychiatric:        Behavior: Behavior normal.        Thought Content: Thought content normal.        Judgment: Judgment normal.    BP (!) 142/77   Pulse 71   Temp 97.8 F (36.6 C) (Temporal)   Resp 20   Ht '5\' 4"'$  (1.626 m)   Wt 238 lb (108 kg)   SpO2 92%   BMI 40.85 kg/m         Assessment & Plan:   Doris Smith in today with chief complaint of Chest Pain (Right  side. ) and trouble sleeping   1. Side pain Cough and deep breathe every 2 hours while splinting area Will call and let you know if radiologist sees rib fracture - DG Ribs Unilateral W/Chest Right    The above assessment and management plan was discussed with the patient. The patient verbalized understanding of and has agreed to the management plan. Patient is aware to call the clinic if symptoms persist or worsen. Patient is aware when to return to the clinic for a follow-up visit. Patient educated on when it is appropriate to go to the emergency department.   [name] Done, FNP

## 2022-07-16 ENCOUNTER — Other Ambulatory Visit: Payer: Self-pay | Admitting: Nurse Practitioner

## 2022-07-16 DIAGNOSIS — G2581 Restless legs syndrome: Secondary | ICD-10-CM

## 2022-07-18 DIAGNOSIS — F4310 Post-traumatic stress disorder, unspecified: Secondary | ICD-10-CM | POA: Diagnosis not present

## 2022-07-18 DIAGNOSIS — F410 Panic disorder [episodic paroxysmal anxiety] without agoraphobia: Secondary | ICD-10-CM | POA: Diagnosis not present

## 2022-07-18 DIAGNOSIS — F312 Bipolar disorder, current episode manic severe with psychotic features: Secondary | ICD-10-CM | POA: Diagnosis not present

## 2022-08-04 ENCOUNTER — Ambulatory Visit (INDEPENDENT_AMBULATORY_CARE_PROVIDER_SITE_OTHER): Payer: BC Managed Care – PPO

## 2022-08-04 ENCOUNTER — Encounter: Payer: Self-pay | Admitting: Nurse Practitioner

## 2022-08-04 ENCOUNTER — Ambulatory Visit (INDEPENDENT_AMBULATORY_CARE_PROVIDER_SITE_OTHER): Payer: BC Managed Care – PPO | Admitting: Nurse Practitioner

## 2022-08-04 VITALS — BP 146/80 | HR 61 | Temp 98.1°F | Resp 20 | Ht 64.0 in | Wt 237.0 lb

## 2022-08-04 DIAGNOSIS — K449 Diaphragmatic hernia without obstruction or gangrene: Secondary | ICD-10-CM | POA: Diagnosis not present

## 2022-08-04 DIAGNOSIS — R079 Chest pain, unspecified: Secondary | ICD-10-CM | POA: Diagnosis not present

## 2022-08-04 DIAGNOSIS — R109 Unspecified abdominal pain: Secondary | ICD-10-CM | POA: Diagnosis not present

## 2022-08-04 MED ORDER — KETOROLAC TROMETHAMINE 60 MG/2ML IM SOLN
60.0000 mg | Freq: Once | INTRAMUSCULAR | Status: AC
  Administered 2022-08-04: 60 mg via INTRAMUSCULAR

## 2022-08-04 NOTE — Patient Instructions (Signed)
Flank Pain, Adult ?Flank pain is pain in your side. The flank is the area on your side between your upper belly (abdomen) and your spine. The pain may occur over a short time (acute), or it may be long-term or come back often (chronic). It may be mild or very bad. Pain in this area can be caused by many different things. ?Follow these instructions at home: ? ?Drink enough fluid to keep your pee (urine) pale yellow. ?Rest as told by your doctor. ?Take over-the-counter and prescription medicines only as told by your doctor. ?Keep a journal to keep track of: ?What has caused your flank pain. ?What has made your flank pain feel better. ?Keep all follow-up visits. ?Contact a doctor if: ?Medicine does not help your pain. ?You have new symptoms. ?Your pain gets worse. ?Your symptoms last longer than 2-3 days. ?You have trouble peeing. ?You are peeing more often than normal. ?Get help right away if: ?You have trouble breathing. ?You are short of breath. ?Your belly hurts, or it is swollen or red. ?You feel like you may vomit (nauseous). ?You vomit. ?You feel faint, or you faint. ?You have blood in your pee. ?You have flank pain and a fever. ?These symptoms may be an emergency. Get help right away. Call your local emergency services (911 in the [place] ?Do not wait to see if the symptoms will go away. ?Do not drive yourself to the hospital. ?Summary ?Flank pain is pain in your side. The flank is the area of your side between your upper belly (abdomen) and your spine. ?Flank pain may occur over a short time (acute), or it may be long-term or come back often (chronic). It may be mild or very bad. ?Pain in this area can be caused by many different things. ?Contact your doctor if your symptoms get worse or last longer than 2-3 days. ?This information is not intended to replace advice given to you by your health care provider. Make sure you discuss any questions you have with your health care provider. ?Document Revised:  09/06/2020 Document Reviewed: 09/06/2020 ?Elsevier Patient Education ? 2023 Elsevier Inc. ? ?

## 2022-08-04 NOTE — Progress Notes (Signed)
   Subjective:    Patient ID: Doris Smith, female    DOB: [birth year], 72 y.o.   MRN: [id number]   Chief Complaint: Flank Pain (Right side/)   Flank Pain Pertinent negatives include no abdominal pain, chest pain, headaches or weakness.   Patient fell a few weeks ago and hit her left side. She came into office on 07/05/22 and had xrays. No rib fracture was noted. Today she says that both flank areas are hurting her. She rates pain 10/10 when she takes a moderately deep breath. Denies contusion, no rash.    Review of Systems  Constitutional:  Negative for diaphoresis.  Eyes:  Negative for pain.  Respiratory:  Negative for shortness of breath.   Cardiovascular:  Negative for chest pain, palpitations and leg swelling.  Gastrointestinal:  Negative for abdominal pain.  Endocrine: Negative for polydipsia.  Genitourinary:  Positive for flank pain.  Skin:  Negative for rash.  Neurological:  Negative for dizziness, weakness and headaches.  Hematological:  Does not bruise/bleed easily.  All other systems reviewed and are negative.      Objective:   Physical Exam Vitals reviewed.  Constitutional:      Appearance: Normal appearance.  Cardiovascular:     Rate and Rhythm: Normal rate and regular rhythm.     Heart sounds: Normal heart sounds.  Pulmonary:     Effort: Pulmonary effort is normal.     Breath sounds: Normal breath sounds.  Skin:    General: Skin is warm.  Neurological:     General: No focal deficit present.     Mental Status: She is alert and oriented to person, place, and time.  Psychiatric:        Mood and Affect: Mood normal.        Behavior: Behavior normal.    BP (!) 146/80   Pulse 61   Temp 98.1 F (36.7 C) (Temporal)   Resp 20   Ht '5\' 4"'$  (1.626 m)   Wt 237 lb (107.5 kg)   SpO2 98%   BMI 40.68 kg/m   Chest xray clear.      Assessment & Plan:  Doris Smith in today with chief complaint of Flank Pain (Right side/)   1. Bilateral flank  pain Moist heat Stretches RTO prn - DG Chest 2 View - ketorolac (TORADOL) injection 60 mg    The above assessment and management plan was discussed with the patient. The patient verbalized understanding of and has agreed to the management plan. Patient is aware to call the clinic if symptoms persist or worsen. Patient is aware when to return to the clinic for a follow-up visit. Patient educated on when it is appropriate to go to the emergency department.   [name] Done, FNP

## 2022-08-10 ENCOUNTER — Other Ambulatory Visit: Payer: Self-pay | Admitting: Nurse Practitioner

## 2022-08-10 ENCOUNTER — Telehealth: Payer: Self-pay | Admitting: Nurse Practitioner

## 2022-08-10 MED ORDER — TRAMADOL HCL 50 MG PO TABS: 50.0000 mg | ORAL_TABLET | Freq: Four times a day (QID) | ORAL | 0 refills | Status: DC | PRN

## 2022-08-10 NOTE — Telephone Encounter (Signed)
What else would you like to try for her?

## 2022-08-10 NOTE — Telephone Encounter (Signed)
will do a few days of pain meds but can only do a few.

## 2022-08-10 NOTE — Telephone Encounter (Signed)
Patient notified and verbalized understanding. 

## 2022-08-10 NOTE — Progress Notes (Signed)
Meds ordered this encounter  Medications   traMADol (ULTRAM) 50 MG tablet    Sig: Take 1 tablet (50 mg total) by mouth every 6 (six) hours as needed.    Dispense:  20 tablet    Refill:  0    Order Specific Question:   Supervising Provider    Answer:   Caryl Pina A [id number]

## 2022-08-17 ENCOUNTER — Encounter: Payer: Self-pay | Admitting: Nurse Practitioner

## 2022-08-18 ENCOUNTER — Ambulatory Visit (INDEPENDENT_AMBULATORY_CARE_PROVIDER_SITE_OTHER): Payer: BC Managed Care – PPO

## 2022-08-18 VITALS — Ht 64.0 in | Wt 237.0 lb

## 2022-08-18 DIAGNOSIS — Z0000 Encounter for general adult medical examination without abnormal findings: Secondary | ICD-10-CM | POA: Diagnosis not present

## 2022-08-18 NOTE — Progress Notes (Signed)
Subjective:   Doris Smith is a 72 y.o. female who presents for Medicare Annual (Subsequent) preventive examination. I connected with  Doris Smith on 08/18/22 by a audio enabled telemedicine application and verified that I am speaking with the correct person using two identifiers.  Patient Location: Home  Provider Location: Home Office  I discussed the limitations of evaluation and management by telemedicine. The patient expressed understanding and agreed to proceed.  Review of Systems     Cardiac Risk Factors include: advanced age (>56mn, >>19women);hypertension;dyslipidemia     Objective:    Today's Vitals   08/18/22 1426  Weight: 237 lb (107.5 kg)  Height: 5' 4"$  (1.626 m)   Body mass index is 40.68 kg/m.     08/18/2022    2:30 PM 03/07/2019   10:22 AM 03/03/2019    9:59 AM 02/26/2019   10:13 AM 12/18/2018   10:01 AM 10/21/2018   10:42 AM 09/12/2018    8:46 AM  Advanced Directives  Does Patient Have a Medical Advance Directive? No Yes Yes Yes No Yes No  Type of Advance Directive  Living will;Healthcare Power of Attorney Living will;Healthcare Power of Attorney Living will;Healthcare Power of Attorney  Living will   Does patient want to make changes to medical advance directive?      No - Patient declined   Copy of [place] Chart?  Yes - validated most recent copy scanned in chart (See row information) Yes - validated most recent copy scanned in chart (See row information) Yes - validated most recent copy scanned in chart (See row information)     Would patient like information on creating a medical advance directive? No - Patient declined    No - Patient declined  Yes (MAU/Ambulatory/Procedural Areas - Information given)    Current Medications (verified) Outpatient Encounter Medications as of 08/18/2022  Medication Sig   anastrozole (ARIMIDEX) 1 MG tablet Take 1 tablet (1 mg total) by mouth daily.   apixaban (ELIQUIS) 5 MG TABS tablet Take  1 tablet (5 mg total) by mouth 2 (two) times daily.   clotrimazole-betamethasone (LOTRISONE) cream APPLY  CREAM TOPICALLY TWICE DAILY   esomeprazole (NEXIUM) 40 MG capsule Take 1 capsule (40 mg total) by mouth daily.   fluticasone (FLONASE) 50 MCG/ACT nasal spray Place 2 sprays into both nostrils daily.   furosemide (LASIX) 20 MG tablet Take 1 tablet (20 mg total) by mouth daily.   gabapentin (NEURONTIN) 300 MG capsule Take 1 capsule (300 mg total) by mouth 3 (three) times daily.   ibandronate (BONIVA) 150 MG tablet Take 1 tablet (150 mg total) by mouth every 30 (thirty) days. Take in the morning with a full glass of water, on an empty stomach, and do not take anything else by mouth or lie down for the next 30 min.   loratadine (CLARITIN) 10 MG tablet Take 10 mg by mouth daily.   Lurasidone HCl (LATUDA) 60 MG TABS TAKE 1 TABLET BY MOUTH ONCE DAILY   metoprolol tartrate (LOPRESSOR) 25 MG tablet Take 1 tablet (25 mg total) by mouth 2 (two) times daily.   midodrine (PROAMATINE) 2.5 MG tablet Take 1 tablet (2.5 mg total) by mouth 3 (three) times daily with meals.   pramipexole (MIRAPEX) 1 MG tablet TAKE 1 TABLET BY MOUTH THREE TIMES DAILY   prochlorperazine (COMPAZINE) 10 MG tablet Take 1 tablet by mouth daily.   tizanidine (ZANAFLEX) 2 MG capsule Take 2 mg by mouth 3 (three)  times daily as needed.   traMADol (ULTRAM) 50 MG tablet Take 1 tablet (50 mg total) by mouth every 6 (six) hours as needed.   triamcinolone (KENALOG) 0.1 % Apply 1 application. topically 2 (two) times daily.   Vilazodone HCl (VIIBRYD) 40 MG TABS Take 1 tablet (40 mg total) by mouth daily.   Facility-Administered Encounter Medications as of 08/18/2022  Medication   heparin lock flush 100 unit/mL   sodium chloride flush (NS) 0.9 % injection 10 mL    Allergies (verified) Iohexol, Codeine, Palonosetron, Prednisone, Sulfonamide derivatives, Celecoxib, Sulfa antibiotics, and Vitamin b12   History: Past Medical History:   Diagnosis Date   A-fib ([place]) 11/2012   PAF   Anxiety    takes Ativan   Asthma 04/07/2011   dx   Bipolar disorder ([place])    Cancer ([place])    Right breast   Depression    Early cataracts, bilateral    Fatty liver    Fibromyalgia    GERD (gastroesophageal reflux disease)    Irritable bowel syndrome    Mental disorder    dx bipolar   PONV (postoperative nausea and vomiting)    Sleep apnea 04/2011    does not wear CPAP   Past Surgical History:  Procedure Laterality Date   ABDOMINAL HYSTERECTOMY     COLONOSCOPY     DILATION AND CURETTAGE OF UTERUS  1981   abnormal pap   KNEE ARTHROSCOPY Left 2007   x 2   LUMBAR LAMINECTOMY/DECOMPRESSION MICRODISCECTOMY  05/31/2011   Procedure: LUMBAR LAMINECTOMY/DECOMPRESSION MICRODISCECTOMY;  Surgeon: Johnn Hai;  Location: [facility];  Service: Orthopedics;  Laterality: Left;  Decompression Lumbar four to five and  Lumbar five to Sacral one on Left  (X-Ray)   MASTECTOMY W/ SENTINEL NODE BIOPSY Right 09/16/2018   Procedure: RIGHT MASTECTOMY WITH RIGHT AXILLARY SENTINEL LYMPH NODE BIOPSY;  Surgeon: Alphonsa Overall, MD;  Location: [facility];  Service: General;  Laterality: Right;   PARTIAL HYSTERECTOMY  1982   PORTACATH PLACEMENT Left 10/31/2018   Procedure: INSERTION PORT-A-CATH WITH ULTRASOUND;  Surgeon: Alphonsa Overall, MD;  Location: [place];  Service: General;  Laterality: Left;   TONSILLECTOMY     as child   TOTAL KNEE ARTHROPLASTY Left 12/18/2005   Family History  Problem Relation Age of Onset   Anesthesia problems Mother    Heart disease Mother        CHF, atrial fib   Heart failure Mother    Anesthesia problems Sister    Colon polyps Father    Heart disease Father        "Fluid around the heart"   Hypertension Sister    Breast cancer Maternal Aunt    Colon cancer Maternal Aunt    Prostate cancer Neg Hx    Ovarian cancer Neg Hx    Social History   Socioeconomic History   Marital status: Married    Spouse name:  [name]   Number of children: 2   Years of education: 12   Highest education level: Not on file  Occupational History   Occupation: Disabled  Tobacco Use   Smoking status: Never   Smokeless tobacco: Never  Vaping Use   Vaping Use: Never used  Substance and Sexual Activity   Alcohol use: No   Drug use: No   Sexual activity: Yes    Birth control/protection: Post-menopausal, Surgical  Other Topics Concern   Not on file  Social History Narrative   Tea daily.  Rarely has  caffeine    Social Determinants of Health   Financial Resource Strain: Low Risk  (08/18/2022)   Overall Financial Resource Strain (CARDIA)    Difficulty of Paying Living Expenses: Not hard at all  Food Insecurity: Unknown (08/18/2022)   Hunger Vital Sign    Worried About Running Out of Food in the Last Year: Patient refused    [place] in the Last Year: Never true  Transportation Needs: No Transportation Needs (08/18/2022)   PRAPARE - Hydrologist (Medical): No    Lack of Transportation (Non-Medical): No  Physical Activity: Inactive (08/18/2022)   Exercise Vital Sign    Days of Exercise per Week: 0 days    Minutes of Exercise per Session: 0 min  Stress: No Stress Concern Present (08/18/2022)   [place]    Feeling of Stress : Not at all  Social Connections: Moderately Integrated (08/18/2022)   Social Connection and Isolation Panel [NHANES]    Frequency of Communication with Friends and Family: More than three times a week    Frequency of Social Gatherings with Friends and Family: More than three times a week    Attends Religious Services: More than 4 times per year    Active Member of [employer] or Organizations: No    Attends Music therapist: Never    Marital Status: Married    Tobacco Counseling Counseling given: Not Answered   Clinical Intake:  Pre-visit preparation completed: Yes  Pain : No/denies  pain     Nutritional Risks: None Diabetes: No  How often do you need to have someone help you when you read instructions, pamphlets, or other written materials from your doctor or pharmacy?: 1 - Never  Diabetic?no   Interpreter Needed?: No  Information entered by :: Jadene Pierini, LPN   Activities of Daily Living    08/18/2022    2:31 PM  In your present state of health, do you have any difficulty performing the following activities:  Hearing? 0  Vision? 0  Difficulty concentrating or making decisions? 0  Walking or climbing stairs? 0  Dressing or bathing? 0  Doing errands, shopping? 0  Preparing Food and eating ? N  Using the Toilet? N  In the past six months, have you accidently leaked urine? N  Do you have problems with loss of bowel control? N  Managing your Medications? N  Managing your Finances? N  Housekeeping or managing your Housekeeping? N    Patient Care Team: Chevis Pretty, FNP as PCP - General (Nurse Practitioner) Lendon Colonel, NP as PCP - Cardiology (Nurse Practitioner) Alphonsa Overall, MD as Consulting Physician (General Surgery) Magrinat, Virgie Dad, MD (Inactive) as Consulting Physician (Oncology) Kyung Rudd, MD as Consulting Physician (Radiation Oncology) Elijio Miles, MD as Consulting Physician (Pulmonary Disease) Netta Cedars, MD as Consulting Physician (Orthopedic Surgery) Susa Day, MD as Consulting Physician (Orthopedic Surgery) Janeth Rase, NP as Nurse Practitioner (Adult Health Nurse Practitioner) Mauro Kaufmann, RN as Oncology Nurse Navigator Rockwell Germany, RN as Oncology Nurse Navigator Irene Limbo, MD as Consulting Physician (Plastic Surgery) Minus Breeding, MD as Consulting Physician (Cardiology)  Indicate any recent Medical Services you may have received from other than [facility] providers in the past year (date may be approximate).     Assessment:   This is a routine wellness examination for  Doris Smith.  Hearing/Vision screen Vision Screening - Comments:: Wears rx glasses - up to date  with routine eye exams with  Dr.Michael  Dietary issues and exercise activities discussed: Current Exercise Habits: The patient has a physically strenuous job, but has no regular exercise apart from work., Exercise limited by: orthopedic condition(s)   Goals Addressed             This Visit's Progress    DIET - INCREASE WATER INTAKE         Depression Screen    08/18/2022    2:29 PM 08/04/2022    1:53 PM 07/05/2022   11:20 AM 06/19/2022   12:25 PM 06/05/2022    9:39 AM 02/16/2022    9:20 AM 01/30/2022    2:03 PM  PHQ 2/9 Scores  PHQ - 2 Score 0 4 4 5 6 4 6  $ PHQ- 9 Score 0 13 17 17 20 15 20    $ Fall Risk    08/18/2022    2:27 PM 08/18/2022    2:26 PM 08/04/2022    1:53 PM 07/05/2022   11:19 AM 06/19/2022   12:25 PM  Fall Risk   Falls in the past year? 1 0 1 1 1  $ Number falls in past yr: 1 0 1 1 1  $ Injury with Fall? 1 0 1 1 1  $ Risk for fall due to : History of fall(s);Impaired balance/gait;Orthopedic patient No Fall Risks History of fall(s) History of fall(s) History of fall(s)  Follow up Education provided;Falls prevention discussed Falls prevention discussed Education provided Education provided Education provided    FALL RISK PREVENTION PERTAINING TO THE HOME:  Any stairs in or around the home? No  If so, are there any without handrails? No  Home free of loose throw rugs in walkways, pet beds, electrical cords, etc? Yes  Adequate lighting in your home to reduce risk of falls? Yes   ASSISTIVE DEVICES UTILIZED TO PREVENT FALLS:  Life alert? No  Use of a cane, walker or w/c? Yes  Grab bars in the bathroom? No  Shower chair or bench in shower? No  Elevated toilet seat or a handicapped toilet? No       10/24/2019   11:29 AM  MMSE - Mini Mental State Exam  Orientation to time 5  Orientation to Place 5  Registration 3  Attention/ Calculation 5  Recall 3  Language- name 2  objects 2  Language- repeat 1  Language- follow 3 step command 3  Language- read & follow direction 1  Write a sentence 1  Copy design 1  Total score 30        08/18/2022    2:31 PM  6CIT Screen  What Year? 0 points  What month? 0 points  What time? 0 points  Count back from 20 0 points  Months in reverse 0 points  Repeat phrase 0 points  Total Score 0 points    Immunizations Immunization History  Administered Date(s) Administered   Fluad Quad(high Dose 65+) 04/23/2019, 04/01/2020   Influenza, High Dose Seasonal PF 05/03/2018   Influenza-Unspecified 05/08/2011, 04/01/2021, 04/09/2022   Janssen (J&J) SARS-COV-2 Vaccination 12/05/2019   Moderna SARS-COV2 Booster Vaccination 05/14/2021   PNEUMOCOCCAL CONJUGATE-20 09/06/2021   Pneumococcal Conjugate-13 10/24/2019   Tdap 10/04/2021   Zoster Recombinat (Shingrix) 05/31/2021, 08/06/2021    TDAP status: Up to date  Flu Vaccine status: Up to date  Pneumococcal vaccine status: Up to date  Covid-19 vaccine status: Completed vaccines  Qualifies for Shingles Vaccine? Yes   Zostavax completed Yes   Shingrix Completed?: Yes  Screening Tests Health  Maintenance  Topic Date Due   COVID-19 Vaccine (2 - Janssen risk series) 08/20/2022 (Originally 06/11/2021)   COLONOSCOPY (Pts 45-81yr Insurance coverage will need to be confirmed)  06/20/2023 (Originally 12/08/2020)   DEXA SCAN  11/16/2022   MAMMOGRAM  04/12/2023   Medicare Annual Wellness (AWV)  08/19/2023   DTaP/Tdap/Td (2 - Td or Tdap) 10/05/2031   Pneumonia Vaccine 70 Years old  Completed   INFLUENZA VACCINE  Completed   Hepatitis C Screening  Completed   Zoster Vaccines- Shingrix  Completed   HPV VACCINES  Aged Out    Health Maintenance  There are no preventive care reminders to display for this patient.   Colorectal cancer screening: Referral to GI placed declined . Pt aware the office will call re: appt.  Mammogram status: Ordered 05/15/2022. Pt provided with  contact info and advised to call to schedule appt.   Bone Density status: Completed 11/15/2020. Results reflect: Bone density results: OSTEOPENIA. Repeat every 5 years.  Lung Cancer Screening: (Low Dose CT Chest recommended if Age 70-80years, 30 pack-year currently smoking OR have quit w/in 15years.) does not qualify.   Lung Cancer Screening Referral: n/a  Additional Screening:  Hepatitis C Screening: does not qualify; Completed   Vision Screening: Recommended annual ophthalmology exams for early detection of glaucoma and other disorders of the eye. Is the patient up to date with their annual eye exam?  Yes  Who is the provider or what is the name of the office in which the patient attends annual eye exams? Dr.Michael  If pt is not established with a provider, would they like to be referred to a provider to establish care? No .   Dental Screening: Recommended annual dental exams for proper oral hygiene  Community Resource Referral / Chronic Care Management: CRR required this visit?  No   CCM required this visit?  No      Plan:     I have personally reviewed and noted the following in the patient's chart:   Medical and social history Use of alcohol, tobacco or illicit drugs  Current medications and supplements including opioid prescriptions. Patient is not currently taking opioid prescriptions. Functional ability and status Nutritional status Physical activity Advanced directives List of other physicians Hospitalizations, surgeries, and ER visits in previous 12 months Vitals Screenings to include cognitive, depression, and falls Referrals and appointments  In addition, I have reviewed and discussed with patient certain preventive protocols, quality metrics, and best practice recommendations. A written personalized care plan for preventive services as well as general preventive health recommendations were provided to patient.     LDaphane Shepherd LPN   [id number]  Nurse  Notes: Declines Colonoscopy /Laurence Slate

## 2022-08-18 NOTE — Patient Instructions (Signed)
Doris Smith , Thank you for taking time to come for your Medicare Wellness Visit. I appreciate your ongoing commitment to your health goals. Please review the following plan we discussed and let me know if I can assist you in the future.   These are the goals we discussed:  Goals      DIET - INCREASE WATER INTAKE        This is a list of the screening recommended for you and due dates:  Health Maintenance  Topic Date Due   COVID-19 Vaccine (2 - Janssen risk series) 08/20/2022*   Colon Cancer Screening  06/20/2023*   DEXA scan (bone density measurement)  11/16/2022   Mammogram  04/12/2023   Medicare Annual Wellness Visit  08/19/2023   DTaP/Tdap/Td vaccine (2 - Td or Tdap) 10/05/2031   Pneumonia Vaccine  Completed   Flu Shot  Completed   Hepatitis C Screening: USPSTF Recommendation to screen - Ages 18-79 yo.  Completed   Zoster (Shingles) Vaccine  Completed   HPV Vaccine  Aged Out  *Topic was postponed. The date shown is not the original due date.    Advanced directives: Advance directive discussed with you today. I have provided a copy for you to complete at home and have notarized. Once this is complete please bring a copy in to our office so we can scan it into your chart.   Conditions/risks identified: Aim for 30 minutes of exercise or brisk walking, 6-8 glasses of water, and 5 servings of fruits and vegetables each day.   Next appointment: Follow up in one year for your annual wellness visit    Preventive Care 65 Years and Older, Female Preventive care refers to lifestyle choices and visits with your health care provider that can promote health and wellness. What does preventive care include? A yearly physical exam. This is also called an annual well check. Dental exams once or twice a year. Routine eye exams. Ask your health care provider how often you should have your eyes checked. Personal lifestyle choices, including: Daily care of your teeth and gums. Regular physical  activity. Eating a healthy diet. Avoiding tobacco and drug use. Limiting alcohol use. Practicing safe sex. Taking low-dose aspirin every day. Taking vitamin and mineral supplements as recommended by your health care provider. What happens during an annual well check? The services and screenings done by your health care provider during your annual well check will depend on your age, overall health, lifestyle risk factors, and family history of disease. Counseling  Your health care provider may ask you questions about your: Alcohol use. Tobacco use. Drug use. Emotional well-being. Home and relationship well-being. Sexual activity. Eating habits. History of falls. Memory and ability to understand (cognition). Work and work Statistician. Reproductive health. Screening  You may have the following tests or measurements: Height, weight, and BMI. Blood pressure. Lipid and cholesterol levels. These may be checked every 5 years, or more frequently if you are over 51 years old. Skin check. Lung cancer screening. You may have this screening every year starting at age 11 if you have a 30-pack-year history of smoking and currently smoke or have quit within the past 15 years. Fecal occult blood test (FOBT) of the stool. You may have this test every year starting at age 33. Flexible sigmoidoscopy or colonoscopy. You may have a sigmoidoscopy every 5 years or a colonoscopy every 10 years starting at age 26. Hepatitis C blood test. Hepatitis B blood test. Sexually transmitted disease (STD) testing.  Diabetes screening. This is done by checking your blood sugar (glucose) after you have not eaten for a while (fasting). You may have this done every 1-3 years. Bone density scan. This is done to screen for osteoporosis. You may have this done starting at age 28. Mammogram. This may be done every 1-2 years. Talk to your health care provider about how often you should have regular mammograms. Talk with your  health care provider about your test results, treatment options, and if necessary, the need for more tests. Vaccines  Your health care provider may recommend certain vaccines, such as: Influenza vaccine. This is recommended every year. Tetanus, diphtheria, and acellular pertussis (Tdap, Td) vaccine. You may need a Td booster every 10 years. Zoster vaccine. You may need this after age 46. Pneumococcal 13-valent conjugate (PCV13) vaccine. One dose is recommended after age 18. Pneumococcal polysaccharide (PPSV23) vaccine. One dose is recommended after age 60. Talk to your health care provider about which screenings and vaccines you need and how often you need them. This information is not intended to replace advice given to you by your health care provider. Make sure you discuss any questions you have with your health care provider. Document Released: 07/23/2015 Document Revised: 03/15/2016 Document Reviewed: 04/27/2015 Elsevier Interactive Patient Education  2017 [employer] Prevention in the Home Falls can cause injuries. They can happen to people of all ages. There are many things you can do to make your home safe and to help prevent falls. What can I do on the outside of my home? Regularly fix the edges of walkways and driveways and fix any cracks. Remove anything that might make you trip as you walk through a door, such as a raised step or threshold. Trim any bushes or trees on the path to your home. Use bright outdoor lighting. Clear any walking paths of anything that might make someone trip, such as rocks or tools. Regularly check to see if handrails are loose or broken. Make sure that both sides of any steps have handrails. Any raised decks and porches should have guardrails on the edges. Have any leaves, snow, or ice cleared regularly. Use sand or salt on walking paths during winter. Clean up any spills in your garage right away. This includes oil or grease spills. What can I  do in the bathroom? Use night lights. Install grab bars by the toilet and in the tub and shower. Do not use towel bars as grab bars. Use non-skid mats or decals in the tub or shower. If you need to sit down in the shower, use a plastic, non-slip stool. Keep the floor dry. Clean up any water that spills on the floor as soon as it happens. Remove soap buildup in the tub or shower regularly. Attach bath mats securely with double-sided non-slip rug tape. Do not have throw rugs and other things on the floor that can make you trip. What can I do in the bedroom? Use night lights. Make sure that you have a light by your bed that is easy to reach. Do not use any sheets or blankets that are too big for your bed. They should not hang down onto the floor. Have a firm chair that has side arms. You can use this for support while you get dressed. Do not have throw rugs and other things on the floor that can make you trip. What can I do in the kitchen? Clean up any spills right away. Avoid walking on wet floors.  Keep items that you use a lot in easy-to-reach places. If you need to reach something above you, use a strong step stool that has a grab bar. Keep electrical cords out of the way. Do not use floor polish or wax that makes floors slippery. If you must use wax, use non-skid floor wax. Do not have throw rugs and other things on the floor that can make you trip. What can I do with my stairs? Do not leave any items on the stairs. Make sure that there are handrails on both sides of the stairs and use them. Fix handrails that are broken or loose. Make sure that handrails are as long as the stairways. Check any carpeting to make sure that it is firmly attached to the stairs. Fix any carpet that is loose or worn. Avoid having throw rugs at the top or bottom of the stairs. If you do have throw rugs, attach them to the floor with carpet tape. Make sure that you have a light switch at the top of the stairs  and the bottom of the stairs. If you do not have them, ask someone to add them for you. What else can I do to help prevent falls? Wear shoes that: Do not have high heels. Have rubber bottoms. Are comfortable and fit you well. Are closed at the toe. Do not wear sandals. If you use a stepladder: Make sure that it is fully opened. Do not climb a closed stepladder. Make sure that both sides of the stepladder are locked into place. Ask someone to hold it for you, if possible. Clearly mark and make sure that you can see: Any grab bars or handrails. First and last steps. Where the edge of each step is. Use tools that help you move around (mobility aids) if they are needed. These include: Canes. Walkers. Scooters. Crutches. Turn on the lights when you go into a dark area. Replace any light bulbs as soon as they burn out. Set up your furniture so you have a clear path. Avoid moving your furniture around. If any of your floors are uneven, fix them. If there are any pets around you, be aware of where they are. Review your medicines with your doctor. Some medicines can make you feel dizzy. This can increase your chance of falling. Ask your doctor what other things that you can do to help prevent falls. This information is not intended to replace advice given to you by your health care provider. Make sure you discuss any questions you have with your health care provider. Document Released: 04/22/2009 Document Revised: 12/02/2015 Document Reviewed: 07/31/2014 Elsevier Interactive Patient Education  2017 [employer].

## 2022-08-21 ENCOUNTER — Telehealth: Payer: Self-pay | Admitting: *Deleted

## 2022-08-21 ENCOUNTER — Telehealth: Payer: Self-pay | Admitting: Hematology and Oncology

## 2022-08-21 NOTE — Telephone Encounter (Signed)
Spoke with patient confirming upcoming appointment 

## 2022-08-21 NOTE — Telephone Encounter (Signed)
Patient called - Patient is continuing to experience pain in ribs on right side mainly, but also on left side. She's concerned it might be the cancer. She was having this pain when she had appt with Dr. Chryl Heck in November. She said Dr. Chryl Heck told her to call office if it continued. Dr. Chryl Heck informed. Per Dr. Chryl Heck, sent schedule message to schedule patient for appt next week, but if patient's pain improves between now and then, patient can cancel appt.  Contacted patient to inform that scheduling will contact her for appt next week. If patient begins to feel better before appt, she can cancel appt.  Patient verbalized understanding of information

## 2022-08-25 ENCOUNTER — Other Ambulatory Visit: Payer: Self-pay

## 2022-08-25 ENCOUNTER — Inpatient Hospital Stay: Payer: BC Managed Care – PPO

## 2022-08-25 ENCOUNTER — Inpatient Hospital Stay: Payer: BC Managed Care – PPO | Attending: Hematology and Oncology | Admitting: Hematology and Oncology

## 2022-08-25 VITALS — BP 137/52 | HR 74 | Temp 98.1°F | Resp 18 | Ht 64.0 in | Wt 236.0 lb

## 2022-08-25 DIAGNOSIS — M8580 Other specified disorders of bone density and structure, unspecified site: Secondary | ICD-10-CM | POA: Insufficient documentation

## 2022-08-25 DIAGNOSIS — C50411 Malignant neoplasm of upper-outer quadrant of right female breast: Secondary | ICD-10-CM | POA: Diagnosis not present

## 2022-08-25 DIAGNOSIS — Z79811 Long term (current) use of aromatase inhibitors: Secondary | ICD-10-CM | POA: Insufficient documentation

## 2022-08-25 DIAGNOSIS — Z923 Personal history of irradiation: Secondary | ICD-10-CM | POA: Insufficient documentation

## 2022-08-25 DIAGNOSIS — C50811 Malignant neoplasm of overlapping sites of right female breast: Secondary | ICD-10-CM | POA: Diagnosis not present

## 2022-08-25 DIAGNOSIS — Z170 Estrogen receptor positive status [ER+]: Secondary | ICD-10-CM

## 2022-08-25 DIAGNOSIS — Z9011 Acquired absence of right breast and nipple: Secondary | ICD-10-CM | POA: Diagnosis not present

## 2022-08-25 LAB — CBC WITH DIFFERENTIAL/PLATELET
Abs Immature Granulocytes: 0.04 10*3/uL (ref 0.00–0.07)
Basophils Absolute: 0.1 10*3/uL (ref 0.0–0.1)
Basophils Relative: 1 %
Eosinophils Absolute: 0.1 10*3/uL (ref 0.0–0.5)
Eosinophils Relative: 1 %
HCT: 32.5 % — ABNORMAL LOW (ref 36.0–46.0)
Hemoglobin: 10.8 g/dL — ABNORMAL LOW (ref 12.0–15.0)
Immature Granulocytes: 1 %
Lymphocytes Relative: 10 %
Lymphs Abs: 0.6 10*3/uL — ABNORMAL LOW (ref 0.7–4.0)
MCH: 30 pg (ref 26.0–34.0)
MCHC: 33.2 g/dL (ref 30.0–36.0)
MCV: 90.3 fL (ref 80.0–100.0)
Monocytes Absolute: 0.5 10*3/uL (ref 0.1–1.0)
Monocytes Relative: 8 %
Neutro Abs: 4.8 10*3/uL (ref 1.7–7.7)
Neutrophils Relative %: 79 %
Platelets: 188 10*3/uL (ref 150–400)
RBC: 3.6 MIL/uL — ABNORMAL LOW (ref 3.87–5.11)
RDW: 16 % — ABNORMAL HIGH (ref 11.5–15.5)
WBC: 6 10*3/uL (ref 4.0–10.5)
nRBC: 0 % (ref 0.0–0.2)

## 2022-08-25 LAB — CMP (CANCER CENTER ONLY)
ALT: 26 U/L (ref 0–44)
AST: 22 U/L (ref 15–41)
Albumin: 3.6 g/dL (ref 3.5–5.0)
Alkaline Phosphatase: 126 U/L (ref 38–126)
Anion gap: 6 (ref 5–15)
BUN: 14 mg/dL (ref 8–23)
CO2: 30 mmol/L (ref 22–32)
Calcium: 9.4 mg/dL (ref 8.9–10.3)
Chloride: 106 mmol/L (ref 98–111)
Creatinine: 1.07 mg/dL — ABNORMAL HIGH (ref 0.44–1.00)
GFR, Estimated: 56 mL/min — ABNORMAL LOW (ref >60)
Glucose, Bld: 115 mg/dL — ABNORMAL HIGH (ref 70–99)
Potassium: 3.5 mmol/L (ref 3.5–5.1)
Sodium: 142 mmol/L (ref 135–145)
Total Bilirubin: 0.5 mg/dL (ref 0.3–1.2)
Total Protein: 6.5 g/dL (ref 6.5–8.1)

## 2022-08-25 NOTE — Progress Notes (Signed)
[place]  [phone] [phone]    ID: Doris Smith Mole DOB: 20-Dec-1950  MR#: [id number]  [id number]  Patient Care Team: Chevis Pretty, FNP as PCP - General (Nurse Practitioner) Lendon Colonel, NP as PCP - Cardiology (Nurse Practitioner) Alphonsa Overall, MD as Consulting Physician (General Surgery) Magrinat, Virgie Dad, MD (Inactive) as Consulting Physician (Oncology) Kyung Rudd, MD as Consulting Physician (Radiation Oncology) Elijio Miles, MD as Consulting Physician (Pulmonary Disease) Netta Cedars, MD as Consulting Physician (Orthopedic Surgery) Susa Day, MD as Consulting Physician (Orthopedic Surgery) Janeth Rase, NP as Nurse Practitioner (Adult Health Nurse Practitioner) Mauro Kaufmann, RN as Oncology Nurse Navigator Rockwell Germany, RN as Oncology Nurse Navigator Irene Limbo, MD as Consulting Physician (Plastic Surgery) Minus Breeding, MD as Consulting Physician (Cardiology) Benay Pike, MD OTHER MD: Chapman Moss, psychiatry   CHIEF COMPLAINT: Estrogen receptor positive breast cancer (s/p right mastectomy)  CURRENT TREATMENT: anastrozole; ibandronate   INTERVAL HISTORY:  Doris Smith returns today for follow-up of her estrogen receptor positive breast cancer.  She has been taking anastrozole as recommended.  She says the rib pain on the left side continues, now she notices pain in the right arm pain  and another area below that, started in December. No other bone pains. No change in breathing. No change in bowel or urinary habits.  HISTORY OF CURRENT ILLNESS: From the original intake note:  "Doris Smith" presented to her PCP, Dr. Hassell Done, with right breast pain, fullness, and nipple inversion since November 2019. She proceeded to undergo bilateral diagnostic mammography with tomography and right breast ultrasonography at [facility] on 07/31/2018 showing: findings compatible with multicentric breast  cancer spanning at least the upper inner and upper outer quadrants of the right breast; normal right axilla. Palpable firmness of approximately 1.5 cm was noted in the 9 o'clock position, which was also seen on ultrasound with indisctinct margins measuring 2.7 x 2.3 x 2.2 cm. At 1 o'clock, a mass with poorly defined margins measures 1.9 x 1 x 0.8 cm. Additional poorly defined masses were seen in the 12 o'clock retroareolar region.  Accordingly on 08/02/2018 she proceeded to biopsy of the right breast area in question. The pathology ([id number]) from this procedure showed: invasive mammary carcinoma at 9 o'clock and 1 o'clock; e-cadherin negative, consistent with lobular phenotype; grade 2-3. Prognostic indicators significant for: estrogen receptor, 90% positive and progesterone receptor, 1% positive, both with strong staining intensity. Proliferation marker Ki67 at 1%. HER2 equivocal by immunohistochemistry, 2+ but negative by fluorescent in situ hybridization with a signals ratio 1.04 and number per cell 1.2.   The patient's subsequent history is as detailed below.   PAST MEDICAL HISTORY: Past Medical History:  Diagnosis Date   A-fib ([facility]) 11/2012   PAF   Anxiety    takes Ativan   Asthma 04/07/2011   dx   Bipolar disorder ([place] [facility])    Cancer ([facility])    Right breast   Depression    Early cataracts, bilateral    Fatty liver    Fibromyalgia    GERD (gastroesophageal reflux disease)    Irritable bowel syndrome    Mental disorder    dx bipolar   PONV (postoperative nausea and vomiting)    Sleep apnea 04/2011    does not wear CPAP  She has chronic sinus headaches, hiatal hernia   PAST SURGICAL HISTORY: Past Surgical History:  Procedure Laterality Date   ABDOMINAL HYSTERECTOMY     COLONOSCOPY     DILATION AND CURETTAGE  OF UTERUS  1981   abnormal pap   KNEE ARTHROSCOPY Left 2007   x 2   LUMBAR LAMINECTOMY/DECOMPRESSION MICRODISCECTOMY  05/31/2011   Procedure: LUMBAR  LAMINECTOMY/DECOMPRESSION MICRODISCECTOMY;  Surgeon: Johnn Hai;  Location: [facility];  Service: Orthopedics;  Laterality: Left;  Decompression Lumbar four to five and  Lumbar five to Sacral one on Left  (X-Ray)   MASTECTOMY W/ SENTINEL NODE BIOPSY Right 09/16/2018   Procedure: RIGHT MASTECTOMY WITH RIGHT AXILLARY SENTINEL LYMPH NODE BIOPSY;  Surgeon: Alphonsa Overall, MD;  Location: [facility];  Service: General;  Laterality: Right;   PARTIAL HYSTERECTOMY  1982   PORTACATH PLACEMENT Left 10/31/2018   Procedure: INSERTION PORT-A-CATH WITH ULTRASOUND;  Surgeon: Alphonsa Overall, MD;  Location: [facility];  Service: General;  Laterality: Left;   TONSILLECTOMY     as child   TOTAL KNEE ARTHROPLASTY Left 12/18/2005    FAMILY HISTORY Family History  Problem Relation Age of Onset   Anesthesia problems Mother    Heart disease Mother        CHF, atrial fib   Heart failure Mother    Anesthesia problems Sister    Colon polyps Father    Heart disease Father        "Fluid around the heart"   Hypertension Sister    Breast cancer Maternal Aunt    Colon cancer Maternal Aunt    Prostate cancer Neg Hx    Ovarian cancer Neg Hx   As of February 2019, patient father is alive at 60 years old. Patient mother died at age 81 in June 07, 2020. She notes a family hx of breast cancer. A maternal aunt was diagnosed with breast cancer, but Doris Smith is unsure if it was in one or both breasts. She has 3 siblings, 3 sisters and 0 brothers.   GYNECOLOGIC HISTORY:  No LMP recorded. Patient is postmenopausal. Menarche: 72 years old Age at first live birth: 72 years old [place] P 2 LMP about 42 years ago Contraceptive no HRT no  Hysterectomy? partial So? no   SOCIAL HISTORY:  Doris Smith worked in [employer] for 15 years. She is on disability because her back, knees, and nerves. Her husband, [name], has been at [employer] 43 years as a Freight forwarder.  Even though their home is in [place] they are actually living in  [place] in an apartment while her husband works there.  Son Legrand Como, age 36, works as a Chief Strategy Officer in [place], [place]. Son Shanon Brow, age 40, works as a Building control surveyor in [place], [place]. They have 4 grandchildren, 2 great-grandchildren with 2 more on the way. She attends a [employer].   ADVANCED DIRECTIVES: In the absence of any documents to the contrary the patient's husband is her healthcare power of attorney   HEALTH MAINTENANCE: Social History   Tobacco Use   Smoking status: Never   Smokeless tobacco: Never  Vaping Use   Vaping Use: Never used  Substance Use Topics   Alcohol use: No   Drug use: No     Colonoscopy: 2019  PAP: 11/03/2014, normal  Bone density: 10/13/2016, T-score of -2.1   Allergies  Allergen Reactions   Iohexol     "over 20 years ago" had reaction "hurting in arm and heart"-Done in [place], [place].Marland Kitchenphysician stopped CT at that time.   Codeine Other (See Comments)    Makes feel like she is wild    Palonosetron Other (See Comments)    headache   Prednisone Other (See Comments)  Makes me very irritable   Sulfonamide Derivatives Other (See Comments)    Upset stomach   Celecoxib Nausea And Vomiting   Sulfa Antibiotics Nausea And Vomiting   Vitamin B12 Rash    Current Outpatient Medications  Medication Sig Dispense Refill   anastrozole (ARIMIDEX) 1 MG tablet Take 1 tablet (1 mg total) by mouth daily. 90 tablet 4   apixaban (ELIQUIS) 5 MG TABS tablet Take 1 tablet (5 mg total) by mouth 2 (two) times daily. 180 tablet 1   clotrimazole-betamethasone (LOTRISONE) cream APPLY  CREAM TOPICALLY TWICE DAILY 45 g 0   esomeprazole (NEXIUM) 40 MG capsule Take 1 capsule (40 mg total) by mouth daily. 90 capsule 1   fluticasone (FLONASE) 50 MCG/ACT nasal spray Place 2 sprays into both nostrils daily. 16 g 6   furosemide (LASIX) 20 MG tablet Take 1 tablet (20 mg total) by mouth daily. 90 tablet 1   gabapentin (NEURONTIN) 300 MG capsule Take 1 capsule (300 mg total) by mouth 3 (three) times  daily. 90 capsule 5   ibandronate (BONIVA) 150 MG tablet Take 1 tablet (150 mg total) by mouth every 30 (thirty) days. Take in the morning with a full glass of water, on an empty stomach, and do not take anything else by mouth or lie down for the next 30 min. 12 tablet 0   loratadine (CLARITIN) 10 MG tablet Take 10 mg by mouth daily.     Lurasidone HCl (LATUDA) 60 MG TABS TAKE 1 TABLET BY MOUTH ONCE DAILY 90 tablet 1   metoprolol tartrate (LOPRESSOR) 25 MG tablet Take 1 tablet (25 mg total) by mouth 2 (two) times daily. 180 tablet 1   midodrine (PROAMATINE) 2.5 MG tablet Take 1 tablet (2.5 mg total) by mouth 3 (three) times daily with meals. 270 tablet 3   pramipexole (MIRAPEX) 1 MG tablet TAKE 1 TABLET BY MOUTH THREE TIMES DAILY 90 tablet 1   prochlorperazine (COMPAZINE) 10 MG tablet Take 1 tablet by mouth daily.     tizanidine (ZANAFLEX) 2 MG capsule Take 2 mg by mouth 3 (three) times daily as needed.     traMADol (ULTRAM) 50 MG tablet Take 1 tablet (50 mg total) by mouth every 6 (six) hours as needed. 20 tablet 0   triamcinolone (KENALOG) 0.1 % Apply 1 application. topically 2 (two) times daily.     Vilazodone HCl (VIIBRYD) 40 MG TABS Take 1 tablet (40 mg total) by mouth daily. 90 tablet 1   No current facility-administered medications for this visit.   Facility-Administered Medications Ordered in Other Visits  Medication Dose Route Frequency Provider Last Rate Last Admin   heparin lock flush 100 unit/mL  500 Units Intravenous Once Magrinat, Virgie Dad, MD       sodium chloride flush (NS) 0.9 % injection 10 mL  10 mL Intravenous PRN Magrinat, Virgie Dad, MD        OBJECTIVE: white woman who appears stated age  Vitals:   08/25/22 0842  BP: (!) 137/52  Pulse: 74  Resp: 18  Temp: 98.1 F (36.7 C)  SpO2: 100%   Wt Readings from Last 3 Encounters:  08/25/22 236 lb (107 kg)  08/18/22 237 lb (107.5 kg)  08/04/22 237 lb (107.5 kg)   Body mass index is 40.51 kg/m.    ECOG FS:2 -  Symptomatic, <50% confined to bed  Physical Exam Constitutional:      Appearance: Normal appearance.  Chest:  Breasts:    Left: No tenderness.  Comments: Right breast s/p mastectomy. No evidence of local recurrence or regional adenopathy.  Left breast normal to inspection and palpation.  No palpable masses or regional adenopathy. No reproducible bone tenderness today. Musculoskeletal:     Cervical back: No rigidity.  Lymphadenopathy:     Cervical: No cervical adenopathy.  Neurological:     Mental Status: She is alert.       LAB RESULTS:  CMP     Component Value Date/Time   NA 145 (H) 06/05/2022 1024   K 4.3 06/05/2022 1024   CL 104 06/05/2022 1024   CO2 23 06/05/2022 1024   GLUCOSE 105 (H) 06/05/2022 1024   GLUCOSE 147 (H) 05/15/2022 0851   BUN 17 06/05/2022 1024   CREATININE 1.15 (H) 06/05/2022 1024   CREATININE 0.90 12/19/2018 1434   CALCIUM 9.9 06/05/2022 1024   PROT 6.6 06/05/2022 1024   ALBUMIN 4.0 06/05/2022 1024   AST 24 06/05/2022 1024   AST 14 (L) 08/14/2018 0758   ALT 18 06/05/2022 1024   ALT 10 08/14/2018 0758   ALKPHOS 135 (H) 06/05/2022 1024   BILITOT 0.5 06/05/2022 1024   BILITOT 0.5 08/14/2018 0758   GFRNONAA 56 (L) 05/15/2022 0851   GFRNONAA >60 12/19/2018 1434   GFRAA 81 02/03/2020 1049   GFRAA >60 12/19/2018 1434    No results found for: "TOTALPROTELP", "ALBUMINELP", "A1GS", "A2GS", "BETS", "BETA2SER", "GAMS", "MSPIKE", "SPEI"  No results found for: "KPAFRELGTCHN", "LAMBDASER", "KAPLAMBRATIO"  Lab Results  Component Value Date   WBC 6.9 06/05/2022   NEUTROABS 5.3 06/05/2022   HGB 12.0 06/05/2022   HCT 37.4 06/05/2022   MCV 86 06/05/2022   PLT 251 06/05/2022   No results found for: "LABCA2"  No components found for: "[id number]"  No results for input(s): "INR" in the last 168 hours.  No results found for: "LABCA2"  No results found for: "[id number]"  No results found for: "CAN125"  No results found for: "CAN153"  No results  found for: "CA2729"  No components found for: "HGQUANT"  No results found for: "CEA1", "CEA" / No results found for: "CEA1", "CEA"   No results found for: "AFPTUMOR"  No results found for: "CHROMOGRNA"  No results found for: "HGBA", "HGBA2QUANT", "HGBFQUANT", "HGBSQUAN" (Hemoglobinopathy evaluation)   No results found for: "LDH"  No results found for: "IRON", "TIBC", "IRONPCTSAT" (Iron and TIBC)  No results found for: "FERRITIN"  Urinalysis    Component Value Date/Time   COLORURINE YELLOW 12/19/2018 1355   APPEARANCEUR Clear 02/03/2020 1045   LABSPEC 1.016 12/19/2018 1355   PHURINE 5.0 12/19/2018 1355   GLUCOSEU Negative 02/03/2020 [place] 12/19/2018 1355   BILIRUBINUR Negative 02/03/2020 [place] 12/19/2018 1355   PROTEINUR Negative 02/03/2020 [place] 12/19/2018 1355   UROBILINOGEN negative 11/03/2014 1039   UROBILINOGEN 0.2 05/25/2011 0923   NITRITE Negative 02/03/2020 1045   NITRITE NEGATIVE 12/19/2018 1355   LEUKOCYTESUR Negative 02/03/2020 1045   LEUKOCYTESUR SMALL (A) 12/19/2018 1355    STUDIES: DG Chest 2 View  Result Date: 08/07/2022 CLINICAL DATA:  Flank pain EXAM: CHEST - 2 VIEW COMPARISON:  07/05/2022 FINDINGS: The heart size and mediastinal contours are within normal limits. Aortic atherosclerosis. Large hiatal hernia. Both lungs are clear. Exaggerated kyphosis at the thoracolumbar junction. IMPRESSION: 1. No active cardiopulmonary disease. 2. Large hiatal hernia. Electronically Signed   By: Davina Poke D.O.   On: 08/07/2022 10:14      ELIGIBLE FOR AVAILABLE RESEARCH PROTOCOL: no  ASSESSMENT: 72 y.o. [place], [place] woman status post right breast biopsy 08/02/2018 for a clinical T3 N0, stage IIA invasive lobular carcinoma, grade 2, estrogen receptor strongly positive, progesterone receptor 1% positive, with no HER-2 amplification and an MIB-1 of 1%.  (a) CT scan of the head and chest, without contrast  08/23/2018 showed nonspecific 0.4 cm left lower lobe pulmonary nodule, no definitive metastatic disease  (b) bone scan 08/23/2018 shows multiple spinal areas of abnormal uptake, but  (c) total spinal MRI 09/03/2018 finds bone scan findings to be secondary to degenerative disease, no evidence of metastatic disease.  (1) status post right mastectomy and sentinel lymph node sampling 09/16/2018 showing a pT3 pN1(mic), stage IIA-IIIA invasive lobular breast cancer, grade 2, with 1 of 5 sampled lymph nodes involved by micrometastatic deposit, with extra nodular extension; ample margins  (2)  MammaPrint "high risk" suggests a 5-year metastasis free survival of 93% with chemotherapy, with an absolute chemotherapy benefit in the greater than 12% range  (3) adjuvant radiation 12/31/2018-02/19/2019:  The right chest wall and regional nodes were treated to 50.4 Gy in 28 fractions followed by a 10 Gy boost over 5 fractions.   (4) anastrozole started neoadjuvantly (on 08/14/2018), discontinued 10/14/2018 in preparation for chemotherapy, resumed October 2020  (a) bone density 10/13/2016 showed osteopenia with a lowest score at -2.1  (b) ibandronate/Boniva started January 2021  (c) bone density 11/11/2020 shows a T score of -2.6  (5) [facility] testing on mastectomy sample (09/16/2018) showed stable MSI and proficient mismatch repair status, with a low mutational burden; BRCA 1 and 2 were not mutated, PI K3 was not mutated, ER B B2 was not mutated, and AKT 1 was not mutated.  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  Physical examination today with no concerns for breast cancer recurrence.  She is still overdue for her mammogram, this has been ordered. She continues to report ongoing pain in the left rib right under the breast, she now also has noticed some pain in the right axilla and right rib cage. No reproducible tenderness on exam today. No palpable chest wall changes or left breast changes. No regional adenopathy Given lobular histology and persistent bone pain, we will do a bone scan for further evaluation She will RTC in about 3 weeks, we will review bone scan results and discuss further treatment plan.  Total time spent: 30 minutes  *Total Encounter Time as defined by the Centers for Medicare and Medicaid Services includes, in addition to the face-to-face time of a patient visit (documented in the note above) non-face-to-face time: obtaining and reviewing outside history, ordering and reviewing medications, tests or procedures, care coordination (communications with other health care professionals or caregivers) and documentation in the medical record.

## 2022-08-26 LAB — CANCER ANTIGEN 27.29: CA 27.29: 2169.4 U/mL — ABNORMAL HIGH (ref 0.0–38.6)

## 2022-08-26 LAB — CANCER ANTIGEN 15-3: CA 15-3: 656 U/mL — ABNORMAL HIGH (ref 0.0–25.0)

## 2022-08-29 DIAGNOSIS — F4310 Post-traumatic stress disorder, unspecified: Secondary | ICD-10-CM | POA: Diagnosis not present

## 2022-08-29 DIAGNOSIS — M5451 Vertebrogenic low back pain: Secondary | ICD-10-CM | POA: Diagnosis not present

## 2022-08-29 DIAGNOSIS — F410 Panic disorder [episodic paroxysmal anxiety] without agoraphobia: Secondary | ICD-10-CM | POA: Diagnosis not present

## 2022-08-29 DIAGNOSIS — F312 Bipolar disorder, current episode manic severe with psychotic features: Secondary | ICD-10-CM | POA: Diagnosis not present

## 2022-09-06 DIAGNOSIS — I4891 Unspecified atrial fibrillation: Secondary | ICD-10-CM | POA: Diagnosis not present

## 2022-09-06 DIAGNOSIS — M545 Low back pain, unspecified: Secondary | ICD-10-CM | POA: Diagnosis not present

## 2022-09-09 ENCOUNTER — Emergency Department (HOSPITAL_COMMUNITY): Payer: BC Managed Care – PPO

## 2022-09-09 ENCOUNTER — Encounter (HOSPITAL_COMMUNITY): Payer: Self-pay | Admitting: *Deleted

## 2022-09-09 ENCOUNTER — Other Ambulatory Visit: Payer: Self-pay

## 2022-09-09 ENCOUNTER — Emergency Department (HOSPITAL_COMMUNITY)
Admission: EM | Admit: 2022-09-09 | Discharge: 2022-09-09 | Disposition: A | Discharge status: Home / Self Care | Payer: BC Managed Care – PPO | Attending: Emergency Medicine | Admitting: Emergency Medicine

## 2022-09-09 DIAGNOSIS — Z7901 Long term (current) use of anticoagulants: Secondary | ICD-10-CM | POA: Diagnosis not present

## 2022-09-09 DIAGNOSIS — Z853 Personal history of malignant neoplasm of breast: Secondary | ICD-10-CM | POA: Diagnosis not present

## 2022-09-09 DIAGNOSIS — R0789 Other chest pain: Secondary | ICD-10-CM | POA: Diagnosis not present

## 2022-09-09 DIAGNOSIS — K449 Diaphragmatic hernia without obstruction or gangrene: Secondary | ICD-10-CM | POA: Diagnosis not present

## 2022-09-09 DIAGNOSIS — R0781 Pleurodynia: Secondary | ICD-10-CM | POA: Diagnosis not present

## 2022-09-09 DIAGNOSIS — M79622 Pain in left upper arm: Secondary | ICD-10-CM | POA: Insufficient documentation

## 2022-09-09 MED ORDER — HYDROCODONE-ACETAMINOPHEN 5-325 MG PO TABS: 1.0000 | ORAL_TABLET | Freq: Four times a day (QID) | ORAL | 0 refills | Status: DC | PRN

## 2022-09-09 MED ORDER — HYDROCODONE-ACETAMINOPHEN 5-325 MG PO TABS
1.0000 | ORAL_TABLET | Freq: Once | ORAL | Status: AC
  Administered 2022-09-09: 1 via ORAL
  Filled 2022-09-09: qty 1

## 2022-09-09 NOTE — ED Notes (Signed)
Pt to ED from home. C/o bilateral rib pain with more pain on the left. Pt states the pain has been going on since October after a fall. Pt states current pain in left rib started suddenly last night. Pain in sharp and shooting and is 10/10.

## 2022-09-09 NOTE — ED Notes (Signed)
AVS reviewed with pt prior to discharge. Pt verbalizes understanding. Belongings with pt upon depart. Pt taken to POV by wheelchair. Spouse driving.

## 2022-09-09 NOTE — Discharge Instructions (Signed)
As discussed, your evaluation today has been largely reassuring.  But, it is important that you monitor your condition carefully, and do not hesitate to return to the ED if you develop new, or concerning changes in your condition. ? ?Otherwise, please follow-up with your physician for appropriate ongoing care. ? ?

## 2022-09-09 NOTE — ED Triage Notes (Signed)
Patient c/o severe bilateral rib pain onset OCT after a fall states she has been seen by several days however the is still very bad.

## 2022-09-09 NOTE — ED Provider Notes (Signed)
[facility] Provider Note   CSN: [id number] Arrival date & time: 09/09/22  1156     History  Chief Complaint  Patient presents with   Chest Pain    Doris Smith is a 72 y.o. female.  HPI Patient presents with chest pain.  She has multiple medical issues most notably history of metastatic cancer, and is scheduled for PET scan in 4 days.  She notes that since October of last year she has had pain in her left superior axilla.  This been persistently in the same area, that was more severe over the past day.  She presents without concern.  No other new complaints including dyspnea, syncope, fall, abdominal pain, fever. She has been taking tramadol previously prescribed without substantial relief.  Similarly she has been using topical analgesia without much relief.    Home Medications Prior to Admission medications   Medication Sig Start Date End Date Taking? Authorizing Provider  HYDROcodone-acetaminophen (NORCO/VICODIN) 5-325 MG tablet Take 1 tablet by mouth every 6 (six) hours as needed for severe pain. 09/09/22  Yes Carmin Muskrat, MD  anastrozole (ARIMIDEX) 1 MG tablet Take 1 tablet (1 mg total) by mouth daily. 06/05/22   Hassell Done [name], FNP  apixaban (ELIQUIS) 5 MG TABS tablet Take 1 tablet (5 mg total) by mouth 2 (two) times daily. 06/05/22   Chevis Pretty, FNP  clotrimazole-betamethasone (LOTRISONE) cream APPLY  CREAM TOPICALLY TWICE DAILY 10/02/19   Chevis Pretty, FNP  esomeprazole (NEXIUM) 40 MG capsule Take 1 capsule (40 mg total) by mouth daily. 06/05/22   Hassell Done, [name], FNP  fluticasone (FLONASE) 50 MCG/ACT nasal spray Place 2 sprays into both nostrils daily. 05/30/21   Hassell Done, [name], FNP  furosemide (LASIX) 20 MG tablet Take 1 tablet (20 mg total) by mouth daily. 06/05/22   Hassell Done, [name], FNP  gabapentin (NEURONTIN) 300 MG capsule Take 1 capsule (300 mg total) by mouth 3 (three)  times daily. 06/05/22   Hassell Done, [name], FNP  ibandronate (BONIVA) 150 MG tablet Take 1 tablet (150 mg total) by mouth every 30 (thirty) days. Take in the morning with a full glass of water, on an empty stomach, and do not take anything else by mouth or lie down for the next 30 min. 05/15/22   Benay Pike, MD  loratadine (CLARITIN) 10 MG tablet Take 10 mg by mouth daily.    [provider]  Lurasidone HCl (LATUDA) 60 MG TABS TAKE 1 TABLET BY MOUTH ONCE DAILY 06/05/22   Hassell Done, [name], FNP  metoprolol tartrate (LOPRESSOR) 25 MG tablet Take 1 tablet (25 mg total) by mouth 2 (two) times daily. 06/05/22   Hassell Done, [name], FNP  midodrine (PROAMATINE) 2.5 MG tablet Take 1 tablet (2.5 mg total) by mouth 3 (three) times daily with meals. 12/30/21   Minus Breeding, MD  pramipexole (MIRAPEX) 1 MG tablet TAKE 1 TABLET BY MOUTH THREE TIMES DAILY 07/17/22   Hassell Done, [name], FNP  prochlorperazine (COMPAZINE) 10 MG tablet Take 1 tablet by mouth daily. 04/19/21   [provider]  tizanidine (ZANAFLEX) 2 MG capsule Take 2 mg by mouth 3 (three) times daily as needed. 11/26/21   [provider]  triamcinolone (KENALOG) 0.1 % Apply 1 application. topically 2 (two) times daily. 06/21/20   [provider]  Vilazodone HCl (VIIBRYD) 40 MG TABS Take 1 tablet (40 mg total) by mouth daily. 06/05/22   Hassell Done [name], FNP      Allergies    Iohexol, Codeine, Palonosetron,  Prednisone, Sulfonamide derivatives, Celecoxib, Sulfa antibiotics, and Vitamin b12    Review of Systems   Review of Systems  All other systems reviewed and are negative.   Physical Exam Updated Vital Signs BP (!) 117/38   Pulse 66   Temp 98.1 F (36.7 C)   Resp 16   Ht '5\' 4"'$  (1.626 m)   Wt 106.1 kg   SpO2 98%   BMI 40.17 kg/m  Physical Exam Vitals and nursing note reviewed.  Constitutional:      General: She is not in acute distress.    Appearance: She is well-developed.   HENT:     Head: Normocephalic and atraumatic.  Eyes:     Conjunctiva/sclera: Conjunctivae normal.  Cardiovascular:     Rate and Rhythm: Normal rate and regular rhythm.  Pulmonary:     Effort: Pulmonary effort is normal. No respiratory distress.     Breath sounds: Normal breath sounds. No stridor.  Chest:    Abdominal:     General: There is no distension.  Skin:    General: Skin is warm and dry.  Neurological:     Mental Status: She is alert and oriented to person, place, and time.     Cranial Nerves: No cranial nerve deficit.  Psychiatric:        Mood and Affect: Mood normal.     ED Results / Procedures / Treatments   Labs (all labs ordered are listed, but only abnormal results are displayed) Labs Reviewed - No data to display  EKG EKG Interpretation  Date/Time:  Saturday September 09 2022 12:05:10 EST Ventricular Rate:  70 PR Interval:    QRS Duration: 80 QT Interval:  384 QTC Calculation: 414 R Axis:   -12 Text Interpretation: Sinus rhythm Artifact Otherwise within normal limits Confirmed by Carmin Muskrat [phone]) on 09/09/2022 2:49:03 PM  Radiology DG Chest 2 View  Result Date: 09/09/2022 CLINICAL DATA:  Chronic left-sided chest and rib pain since October. EXAM: CHEST - 2 VIEW COMPARISON:  Chest x-ray dated August 04, 2022. FINDINGS: Normal heart size. Unchanged large hiatal hernia. No focal consolidation, pleural effusion, or pneumothorax. No acute osseous abnormality. IMPRESSION: 1. No acute cardiopulmonary disease. 2. Unchanged large hiatal hernia. Electronically Signed   By: Titus Dubin M.D.   On: 09/09/2022 12:49    Procedures Procedures    Medications Ordered in ED Medications  HYDROcodone-acetaminophen (NORCO/VICODIN) 5-325 MG per tablet 1 tablet (1 tablet Oral Given 09/09/22 1335)    ED Course/ Medical Decision Making/ A&P                             Medical Decision Making Adult female with multiple medical issues including malignancy presents  with ongoing pain in her left axilla.  Pain has been present for months, but given worsening some suspicion for progression of disease versus other lytic lesion.  No clinical evidence for pneumonia, patient has no increased work of breathing, no hypoxia.  Little evidence for ACS given the specific description of pain in the same area.  Patient had x-ray which I reviewed, no obvious lytic lesions, no consolidation.  She and I discussed her plan for PET scan in 4 days after she had pain relief here with Norco.  Absent other alarming findings, no evidence for bacteremia, sepsis with no hemodynamic instability, no other complaints, patient is appropriate for close outpatient follow-up, as well as return here for concerning changes.  Amount and/or Complexity of  Data Reviewed Independent Historian: spouse External Data Reviewed: notes.    Details: See below for full details of her echocardiogram, and recent assessment with summation from her cancer team. Radiology: ordered and independent interpretation performed. Decision-making details documented in ED Course. ECG/medicine tests: ordered and independent interpretation performed. Decision-making details documented in ED Course.  Risk Prescription drug management. Decision regarding hospitalization.  IMPRESSIONS     1. Normal LV function; trace AI; GLS -24.9%.   2. Left ventricular ejection fraction, by estimation, is 60 to 65%. The  left ventricle has normal function. The left ventricle has no regional  wall motion abnormalities. Left ventricular diastolic parameters were  normal.   3. Right ventricular systolic function is normal. The right ventricular  size is normal. There is normal pulmonary artery systolic pressure.   4. The mitral valve is normal in structure. Trivial mitral valve  regurgitation. No evidence of mitral stenosis.   5. The aortic valve is tricuspid. Aortic valve regurgitation is trivial.  No aortic stenosis is present.   6.  The inferior vena cava is normal in size with greater than 50%  respiratory variability, suggesting right atrial pressure of 3 mmHg.  ASSESSMENT: 72 y.o. [place], [place] woman status post right breast biopsy 08/02/2018 for a clinical T3 N0, stage IIA invasive lobular carcinoma, grade 2, estrogen receptor strongly positive, progesterone receptor 1% positive, with no HER-2 amplification and an MIB-1 of 1%.             (a) CT scan of the head and chest, without contrast 08/23/2018 showed nonspecific 0.4 cm left lower lobe pulmonary nodule, no definitive metastatic disease             (b) bone scan 08/23/2018 shows multiple spinal areas of abnormal uptake, but             (c) total spinal MRI 09/03/2018 finds bone scan findings to be secondary to degenerative disease, no evidence of metastatic disease.   (1) status post right mastectomy and sentinel lymph node sampling 09/16/2018 showing a pT3 pN1(mic), stage IIA-IIIA invasive lobular breast cancer, grade 2, with 1 of 5 sampled lymph nodes involved by micrometastatic deposit, with extra nodular extension; ample margins   (2)  MammaPrint "high risk" suggests a 5-year metastasis free survival of 93% with chemotherapy, with an absolute chemotherapy benefit in the greater than 12% range   (3) adjuvant radiation 12/31/2018-02/19/2019:  The right chest wall and regional nodes were treated to 50.4 Gy in 28 fractions followed by a 10 Gy boost over 5 fractions.    (4) anastrozole started neoadjuvantly (on 08/14/2018), discontinued 10/14/2018 in preparation for chemotherapy, resumed October 2020             (a) bone density 10/13/2016 showed osteopenia with a lowest score at -2.1             (b) ibandronate/Boniva started January 2021             (c) bone density 11/11/2020 shows a T score of -2.6   (5) [facility] testing on mastectomy sample (09/16/2018) showed stable MSI and proficient mismatch repair status, with a low mutational burden; BRCA 1 and 2 were not mutated,  PI K3 was not mutated, ER B B2 was not mutated, and AKT 1 was not mutated.  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  Final Clinical Impression(s) / ED Diagnoses Chest wall pain   Final diagnoses:  Chest wall pain    Rx / DC Orders ED Discharge Orders          Ordered    HYDROcodone-acetaminophen (NORCO/VICODIN) 5-325 MG tablet  Every 6 hours PRN        09/09/22 1450              Carmin Muskrat, MD 09/09/22 1451

## 2022-09-12 ENCOUNTER — Ambulatory Visit (HOSPITAL_COMMUNITY)
Admission: RE | Admit: 2022-09-12 | Discharge: 2022-09-12 | Disposition: A | Discharge status: Home / Self Care | Payer: BC Managed Care – PPO | Source: Ambulatory Visit | Attending: Hematology and Oncology | Admitting: Hematology and Oncology

## 2022-09-12 ENCOUNTER — Encounter (HOSPITAL_COMMUNITY)
Admission: RE | Admit: 2022-09-12 | Discharge: 2022-09-12 | Disposition: A | Discharge status: Home / Self Care | Payer: BC Managed Care – PPO | Source: Ambulatory Visit | Attending: Hematology and Oncology | Admitting: Hematology and Oncology

## 2022-09-12 DIAGNOSIS — Z170 Estrogen receptor positive status [ER+]: Secondary | ICD-10-CM | POA: Diagnosis not present

## 2022-09-12 DIAGNOSIS — C50911 Malignant neoplasm of unspecified site of right female breast: Secondary | ICD-10-CM | POA: Diagnosis not present

## 2022-09-12 DIAGNOSIS — C50411 Malignant neoplasm of upper-outer quadrant of right female breast: Secondary | ICD-10-CM | POA: Diagnosis not present

## 2022-09-12 MED ORDER — TECHNETIUM TC 99M MEDRONATE IV KIT
20.0000 | PACK | Freq: Once | INTRAVENOUS | Status: AC | PRN
  Administered 2022-09-12: 21.6 via INTRAVENOUS

## 2022-09-13 ENCOUNTER — Telehealth: Payer: Self-pay

## 2022-09-13 NOTE — Telephone Encounter (Signed)
     Patient  visit on 09/09/2022  at [facility] was for chest wall pain.  Have you been able to follow up with your primary care physician? Patient has appointment with Oncologist 09/18/2022.  The patient was or was not able to obtain any needed medicine or equipment. No medication prescribed.  Are there diet recommendations that you are having difficulty following? No  Patient expresses understanding of discharge instructions and education provided has no other needs at this time. Yes   [facility] Resource Care Guide   [email or web address]  ?? [phone]   Website: [email or web address]  [email or web address]

## 2022-09-14 ENCOUNTER — Telehealth: Payer: Self-pay | Admitting: Nurse Practitioner

## 2022-09-14 NOTE — Telephone Encounter (Signed)
Can not do p ain meds without being seen

## 2022-09-15 NOTE — Telephone Encounter (Signed)
Patient aware and has appointment on Tuesday.

## 2022-09-16 ENCOUNTER — Other Ambulatory Visit: Payer: Self-pay | Admitting: Nurse Practitioner

## 2022-09-16 DIAGNOSIS — G2581 Restless legs syndrome: Secondary | ICD-10-CM

## 2022-09-18 ENCOUNTER — Inpatient Hospital Stay: Payer: BC Managed Care – PPO | Attending: Hematology and Oncology | Admitting: Hematology and Oncology

## 2022-09-18 ENCOUNTER — Other Ambulatory Visit: Payer: Self-pay

## 2022-09-18 ENCOUNTER — Inpatient Hospital Stay: Payer: BC Managed Care – PPO

## 2022-09-18 VITALS — BP 140/56 | HR 79 | Temp 98.1°F | Resp 18 | Ht 64.0 in | Wt 232.6 lb

## 2022-09-18 DIAGNOSIS — C50811 Malignant neoplasm of overlapping sites of right female breast: Secondary | ICD-10-CM | POA: Insufficient documentation

## 2022-09-18 DIAGNOSIS — M8580 Other specified disorders of bone density and structure, unspecified site: Secondary | ICD-10-CM | POA: Diagnosis not present

## 2022-09-18 DIAGNOSIS — C50411 Malignant neoplasm of upper-outer quadrant of right female breast: Secondary | ICD-10-CM

## 2022-09-18 DIAGNOSIS — Z170 Estrogen receptor positive status [ER+]: Secondary | ICD-10-CM

## 2022-09-18 DIAGNOSIS — Z79899 Other long term (current) drug therapy: Secondary | ICD-10-CM | POA: Diagnosis not present

## 2022-09-18 DIAGNOSIS — Z9011 Acquired absence of right breast and nipple: Secondary | ICD-10-CM | POA: Insufficient documentation

## 2022-09-18 DIAGNOSIS — Z79811 Long term (current) use of aromatase inhibitors: Secondary | ICD-10-CM | POA: Insufficient documentation

## 2022-09-18 LAB — CBC WITH DIFFERENTIAL/PLATELET
Abs Immature Granulocytes: 0.06 10*3/uL (ref 0.00–0.07)
Basophils Absolute: 0 10*3/uL (ref 0.0–0.1)
Basophils Relative: 1 %
Eosinophils Absolute: 0.1 10*3/uL (ref 0.0–0.5)
Eosinophils Relative: 1 %
HCT: 31.1 % — ABNORMAL LOW (ref 36.0–46.0)
Hemoglobin: 10.4 g/dL — ABNORMAL LOW (ref 12.0–15.0)
Immature Granulocytes: 1 %
Lymphocytes Relative: 11 %
Lymphs Abs: 0.6 10*3/uL — ABNORMAL LOW (ref 0.7–4.0)
MCH: 30.1 pg (ref 26.0–34.0)
MCHC: 33.4 g/dL (ref 30.0–36.0)
MCV: 90.1 fL (ref 80.0–100.0)
Monocytes Absolute: 0.5 10*3/uL (ref 0.1–1.0)
Monocytes Relative: 9 %
Neutro Abs: 4.6 10*3/uL (ref 1.7–7.7)
Neutrophils Relative %: 77 %
Platelets: 210 10*3/uL (ref 150–400)
RBC: 3.45 MIL/uL — ABNORMAL LOW (ref 3.87–5.11)
RDW: 15.8 % — ABNORMAL HIGH (ref 11.5–15.5)
WBC: 5.9 10*3/uL (ref 4.0–10.5)
nRBC: 0.3 % — ABNORMAL HIGH (ref 0.0–0.2)

## 2022-09-18 LAB — COMPREHENSIVE METABOLIC PANEL
ALT: 26 U/L (ref 0–44)
AST: 25 U/L (ref 15–41)
Albumin: 3.8 g/dL (ref 3.5–5.0)
Alkaline Phosphatase: 139 U/L — ABNORMAL HIGH (ref 38–126)
Anion gap: 7 (ref 5–15)
BUN: 15 mg/dL (ref 8–23)
CO2: 29 mmol/L (ref 22–32)
Calcium: 9.6 mg/dL (ref 8.9–10.3)
Chloride: 105 mmol/L (ref 98–111)
Creatinine, Ser: 0.94 mg/dL (ref 0.44–1.00)
GFR, Estimated: >60 mL/min (ref >60)
Glucose, Bld: 119 mg/dL — ABNORMAL HIGH (ref 70–99)
Potassium: 3.6 mmol/L (ref 3.5–5.1)
Sodium: 141 mmol/L (ref 135–145)
Total Bilirubin: 0.7 mg/dL (ref 0.3–1.2)
Total Protein: 7.1 g/dL (ref 6.5–8.1)

## 2022-09-18 MED ORDER — IBANDRONATE SODIUM 150 MG PO TABS: 150.0000 mg | ORAL_TABLET | ORAL | 0 refills | Status: DC

## 2022-09-18 MED ORDER — MORPHINE SULFATE 15 MG PO TABS: 15.0000 mg | ORAL_TABLET | Freq: Four times a day (QID) | ORAL | 0 refills | Status: DC | PRN

## 2022-09-18 MED ORDER — ONDANSETRON HCL 8 MG PO TABS: 8.0000 mg | ORAL_TABLET | Freq: Three times a day (TID) | ORAL | 3 refills | Status: DC | PRN

## 2022-09-18 NOTE — Progress Notes (Signed)
Doris Smith  Telephone:([phone] Fax:([phone]) [phone]    ID: Doris Smith Smith DOB: March 04, 1951  MR#: [id number]  [id number]  Patient Care Team: Doris Smith Pretty, FNP as PCP - General (Nurse Practitioner) Doris Smith Colonel, NP as PCP - Cardiology (Nurse Practitioner) Doris Smith Overall, MD as Consulting Physician (General Surgery) Magrinat, Virgie Dad, MD (Inactive) as Consulting Physician (Oncology) Doris Rudd, MD as Consulting Physician (Radiation Oncology) Doris Smith Miles, MD as Consulting Physician (Pulmonary Disease) Doris Cedars, MD as Consulting Physician (Orthopedic Surgery) Doris Day, MD as Consulting Physician (Orthopedic Surgery) Doris Smith Rase, NP as Nurse Practitioner (Adult Health Nurse Practitioner) Doris Kaufmann, RN as Oncology Nurse Navigator Doris Smith Germany, RN as Oncology Nurse Navigator Doris Limbo, MD as Consulting Physician (Plastic Surgery) Doris Breeding, MD as Consulting Physician (Cardiology) Doris Pike, MD OTHER MD: Doris Smith Smith, psychiatry   CHIEF COMPLAINT: Estrogen receptor positive breast cancer (s/p right mastectomy)  CURRENT TREATMENT: anastrozole; ibandronate   INTERVAL HISTORY:  Doris Smith Smith returns today for follow-up of her estrogen receptor positive breast cancer.  She is here for follow up, pain is most in the left side of the chest cage.  She was recently seen in the ED worked up for heart attack, no concerns She understands that bone scan is concerning for disease. Besides the pain, she also feels wiped out. She is taking vycodin every 6 hrs but this hasn't been helping at all. She has also noticed some watery diarrhea alternating with constipation.  Rest of the pertinent 10 point ROS reviewed and negative   HISTORY OF CURRENT ILLNESS: From the original intake note:  "Doris Smith Smith" presented to her PCP, Doris Smith Smith, with right breast pain, fullness, and nipple inversion since November 2019. She proceeded  to undergo bilateral diagnostic mammography with tomography and right breast ultrasonography at [facility] on 07/31/2018 showing: findings compatible with multicentric breast cancer spanning at least the upper inner and upper outer quadrants of the right breast; normal right axilla. Palpable firmness of approximately 1.5 cm was noted in the 9 o'clock position, which was also seen on ultrasound with indisctinct margins measuring 2.7 x 2.3 x 2.2 cm. At 1 o'clock, a mass with poorly defined margins measures 1.9 x 1 x 0.8 cm. Additional poorly defined masses were seen in the 12 o'clock retroareolar region.  Accordingly on 08/02/2018 she proceeded to biopsy of the right breast area in question. The pathology ([id number]) from this procedure showed: invasive mammary carcinoma at 9 o'clock and 1 o'clock; e-cadherin negative, consistent with lobular phenotype; grade 2-3. Prognostic indicators significant for: estrogen receptor, 90% positive and progesterone receptor, 1% positive, both with strong staining intensity. Proliferation marker Ki67 at 1%. HER2 equivocal by immunohistochemistry, 2+ but negative by fluorescent in situ hybridization with a signals ratio 1.04 and number per cell 1.2.   The patient's subsequent history is as detailed below.   PAST MEDICAL HISTORY: Past Medical History:  Diagnosis Date   A-fib ([place]) 11/2012   PAF   Anxiety    takes Ativan   Asthma 04/07/2011   dx   Bipolar disorder ([place])    Cancer ([place])    Right breast   Depression    Early cataracts, bilateral    Fatty liver    Fibromyalgia    GERD (gastroesophageal reflux disease)    Irritable bowel syndrome    Mental disorder    dx bipolar   PONV (postoperative nausea and vomiting)    Sleep apnea 04/2011    does not wear CPAP  She  has chronic sinus headaches, hiatal hernia   PAST SURGICAL HISTORY: Past Surgical History:  Procedure Laterality Date   ABDOMINAL HYSTERECTOMY     COLONOSCOPY     DILATION AND  CURETTAGE OF UTERUS  1981   abnormal pap   KNEE ARTHROSCOPY Left 2007   x 2   LUMBAR LAMINECTOMY/DECOMPRESSION MICRODISCECTOMY  05/31/2011   Procedure: LUMBAR LAMINECTOMY/DECOMPRESSION MICRODISCECTOMY;  Surgeon: Johnn Hai;  Location: [facility];  Service: Orthopedics;  Laterality: Left;  Decompression Lumbar four to five and  Lumbar five to Sacral one on Left  (X-Ray)   MASTECTOMY W/ SENTINEL NODE BIOPSY Right 09/16/2018   Procedure: RIGHT MASTECTOMY WITH RIGHT AXILLARY SENTINEL LYMPH NODE BIOPSY;  Surgeon: Doris Smith Overall, MD;  Location: [facility];  Service: General;  Laterality: Right;   PARTIAL HYSTERECTOMY  1982   PORTACATH PLACEMENT Left 10/31/2018   Procedure: INSERTION PORT-A-CATH WITH ULTRASOUND;  Surgeon: Doris Smith Overall, MD;  Location: [facility];  Service: General;  Laterality: Left;   TONSILLECTOMY     as child   TOTAL KNEE ARTHROPLASTY Left 12/18/2005    FAMILY HISTORY Family History  Problem Relation Age of Onset   Anesthesia problems Mother    Heart disease Mother        CHF, atrial fib   Heart failure Mother    Anesthesia problems Sister    Colon polyps Father    Heart disease Father        "Fluid around the heart"   Hypertension Sister    Breast cancer Maternal Aunt    Colon cancer Maternal Aunt    Prostate cancer Neg Hx    Ovarian cancer Neg Hx   As of February 2019, patient father is alive at 68 years old. Patient mother died at age 54 in 06-09-2020. She notes a family hx of breast cancer. A maternal aunt was diagnosed with breast cancer, but Doris Smith Smith is unsure if it was in one or both breasts. She has 3 siblings, 3 sisters and 0 brothers.   GYNECOLOGIC HISTORY:  No LMP recorded. Patient is postmenopausal. Menarche: 72 years old Age at first live birth: 72 years old [place] P 2 LMP about 42 years ago Contraceptive no HRT no  Hysterectomy? partial So? no   SOCIAL HISTORY:  Doris Smith Smith worked in [employer] for 15 years. She is on disability because her back,  knees, and nerves. Her husband, Doris Smith Smith, has been at [employer] 43 years as a Freight forwarder.  Even though their home is in [place] they are actually living in [place] in an apartment while her husband works there.  Son Doris Smith Smith Como, age 2, works as a Chief Strategy Officer in [place], [place]. Son Shanon Brow, age 59, works as a Building control surveyor in [place], [place]. They have 4 grandchildren, 2 great-grandchildren with 2 more on the way. She attends a [employer].   ADVANCED DIRECTIVES: In the absence of any documents to the contrary the patient's husband is her healthcare power of attorney   HEALTH MAINTENANCE: Social History   Tobacco Use   Smoking status: Never   Smokeless tobacco: Never  Vaping Use   Vaping Use: Never used  Substance Use Topics   Alcohol use: No   Drug use: No     Colonoscopy: 2019  PAP: 11/03/2014, normal  Bone density: 10/13/2016, T-score of -2.1   Allergies  Allergen Reactions   Iohexol     "over 20 years ago" had reaction "hurting in arm and heart"-Smith in [place], [place].Marland Kitchenphysician stopped CT at that time.  Codeine Other (See Comments)    Makes feel like she is wild    Palonosetron Other (See Comments)    headache   Prednisone Other (See Comments)    Makes me very irritable   Sulfonamide Derivatives Other (See Comments)    Upset stomach   Celecoxib Nausea And Vomiting   Sulfa Antibiotics Nausea And Vomiting   Vitamin B12 Rash    Current Outpatient Medications  Medication Sig Dispense Refill   anastrozole (ARIMIDEX) 1 MG tablet Take 1 tablet (1 mg total) by mouth daily. 90 tablet 4   apixaban (ELIQUIS) 5 MG TABS tablet Take 1 tablet (5 mg total) by mouth 2 (two) times daily. 180 tablet 1   clotrimazole-betamethasone (LOTRISONE) cream APPLY  CREAM TOPICALLY TWICE DAILY 45 g 0   esomeprazole (NEXIUM) 40 MG capsule Take 1 capsule (40 mg total) by mouth daily. 90 capsule 1   fluticasone (FLONASE) 50 MCG/ACT nasal spray Place 2 sprays into both nostrils daily. 16 g 6   furosemide (LASIX) 20  MG tablet Take 1 tablet (20 mg total) by mouth daily. 90 tablet 1   gabapentin (NEURONTIN) 300 MG capsule Take 1 capsule (300 mg total) by mouth 3 (three) times daily. 90 capsule 5   HYDROcodone-acetaminophen (NORCO/VICODIN) 5-325 MG tablet Take 1 tablet by mouth every 6 (six) hours as needed for severe pain. 15 tablet 0   ibandronate (BONIVA) 150 MG tablet Take 1 tablet (150 mg total) by mouth every 30 (thirty) days. Take in the morning with a full glass of water, on an empty stomach, and do not take anything else by mouth or lie down for the next 30 min. 12 tablet 0   loratadine (CLARITIN) 10 MG tablet Take 10 mg by mouth daily.     Lurasidone HCl (LATUDA) 60 MG TABS TAKE 1 TABLET BY MOUTH ONCE DAILY 90 tablet 1   metoprolol tartrate (LOPRESSOR) 25 MG tablet Take 1 tablet (25 mg total) by mouth 2 (two) times daily. 180 tablet 1   midodrine (PROAMATINE) 2.5 MG tablet Take 1 tablet (2.5 mg total) by mouth 3 (three) times daily with meals. 270 tablet 3   pramipexole (MIRAPEX) 1 MG tablet TAKE 1 TABLET BY MOUTH THREE TIMES DAILY 90 tablet 0   prochlorperazine (COMPAZINE) 10 MG tablet Take 1 tablet by mouth daily.     tizanidine (ZANAFLEX) 2 MG capsule Take 2 mg by mouth 3 (three) times daily as needed.     triamcinolone (KENALOG) 0.1 % Apply 1 application. topically 2 (two) times daily.     Vilazodone HCl (VIIBRYD) 40 MG TABS Take 1 tablet (40 mg total) by mouth daily. 90 tablet 1   No current facility-administered medications for this visit.   Facility-Administered Medications Ordered in Other Visits  Medication Dose Route Frequency Provider Last Rate Last Admin   heparin lock flush 100 unit/mL  500 Units Intravenous Once Magrinat, Virgie Dad, MD       sodium chloride flush (NS) 0.9 % injection 10 mL  10 mL Intravenous PRN Magrinat, Virgie Dad, MD        OBJECTIVE: white woman who appears stated age  Vitals:   09/18/22 0957  BP: (!) 140/56  Pulse: 79  Resp: 18  Temp: 98.1 F (36.7 C)  SpO2:  100%   Wt Readings from Last 3 Encounters:  09/18/22 232 lb 9.6 oz (105.5 kg)  09/09/22 234 lb (106.1 kg)  08/25/22 236 lb (107 kg)   Body mass index is 39.93  kg/m.    ECOG FS:2 - Symptomatic, <50% confined to bed  Physical Exam Constitutional:      Appearance: Normal appearance.  Chest:  Breasts:    Left: No mass or tenderness.     Comments: Palpable bone tenderness left chest wall below the breast. Musculoskeletal:     Cervical back: No rigidity.  Lymphadenopathy:     Cervical: No cervical adenopathy.  Neurological:     Mental Status: She is alert.       LAB RESULTS:  CMP     Component Value Date/Time   NA 142 08/25/2022 0921   NA 145 (H) 06/05/2022 1024   K 3.5 08/25/2022 0921   CL 106 08/25/2022 0921   CO2 30 08/25/2022 0921   GLUCOSE 115 (H) 08/25/2022 0921   BUN 14 08/25/2022 0921   BUN 17 06/05/2022 1024   CREATININE 1.07 (H) 08/25/2022 0921   CALCIUM 9.4 08/25/2022 0921   PROT 6.5 08/25/2022 0921   PROT 6.6 06/05/2022 1024   ALBUMIN 3.6 08/25/2022 0921   ALBUMIN 4.0 06/05/2022 1024   AST 22 08/25/2022 0921   ALT 26 08/25/2022 0921   ALKPHOS 126 08/25/2022 0921   BILITOT 0.5 08/25/2022 0921   GFRNONAA 56 (L) 08/25/2022 0921   GFRAA 81 02/03/2020 1049   GFRAA >60 12/19/2018 1434    No results found for: "TOTALPROTELP", "ALBUMINELP", "A1GS", "A2GS", "BETS", "BETA2SER", "GAMS", "MSPIKE", "SPEI"  No results found for: "KPAFRELGTCHN", "LAMBDASER", "KAPLAMBRATIO"  Lab Results  Component Value Date   WBC 6.0 08/25/2022   NEUTROABS 4.8 08/25/2022   HGB 10.8 (L) 08/25/2022   HCT 32.5 (L) 08/25/2022   MCV 90.3 08/25/2022   PLT 188 08/25/2022   No results found for: "LABCA2"  No components found for: "[id number]"  No results for input(s): "INR" in the last 168 hours.  No results found for: "LABCA2"  No results found for: "CAN199"  No results found for: "CAN125"  Lab Results  Component Value Date   CAN153 656.0 (H) 08/25/2022    Lab  Results  Component Value Date   CA2729 2,169.4 (H) 08/25/2022    No components found for: "HGQUANT"  No results found for: "CEA1", "CEA" / No results found for: "CEA1", "CEA"   No results found for: "AFPTUMOR"  No results found for: "CHROMOGRNA"  No results found for: "HGBA", "HGBA2QUANT", "HGBFQUANT", "HGBSQUAN" (Hemoglobinopathy evaluation)   No results found for: "LDH"  No results found for: "IRON", "TIBC", "IRONPCTSAT" (Iron and TIBC)  No results found for: "FERRITIN"  Urinalysis    Component Value Date/Time   COLORURINE YELLOW 12/19/2018 1355   APPEARANCEUR Clear 02/03/2020 1045   LABSPEC 1.016 12/19/2018 1355   PHURINE 5.0 12/19/2018 1355   GLUCOSEU Negative 02/03/2020 [place] 12/19/2018 1355   BILIRUBINUR Negative 02/03/2020 1045   KETONESUR NEGATIVE 12/19/2018 1355   PROTEINUR Negative 02/03/2020 1045   PROTEINUR NEGATIVE 12/19/2018 1355   UROBILINOGEN negative 11/03/2014 1039   UROBILINOGEN 0.2 05/25/2011 0923   NITRITE Negative 02/03/2020 1045   NITRITE NEGATIVE 12/19/2018 1355   LEUKOCYTESUR Negative 02/03/2020 1045   LEUKOCYTESUR SMALL (A) 12/19/2018 1355    STUDIES: NM Bone Scan Whole Body  Result Date: 09/12/2022 CLINICAL DATA:  Metastatic breast cancer workup status post right mastectomy 09/16/2018 and chemo. New onset left-sided rib and back pain. EXAM: NUCLEAR MEDICINE WHOLE BODY BONE SCAN TECHNIQUE: Whole body anterior and posterior images were obtained approximately 3 hours after intravenous injection of radiopharmaceutical. RADIOPHARMACEUTICALS:  21.6 mCi Technetium-81mMDP  IV COMPARISON:  Nuclear medicine bone scan August 23, 2018 FINDINGS: New multifocal foci of abnormal radiotracer uptake involving the right second, sixth and eighth ribs, the left third fourth eighth ninth and tenth ribs as well as the manubrium/sternum. Focus of abnormal uptake at the medial aspect of the right knee Multifocal radiotracer uptake involving the  shoulders, spine and hips in a pattern most consistent with degenerative arthropathy. Photopenia along the left knee likely reflects a knee prosthesis. Otherwise physiologic distribution of radiotracer activity. IMPRESSION: New multifocal abnormal radiotracer uptake involving the bilateral ribs and manubrium/sternum concerning for osteoblastic metastatic disease, consider correlation with CT. Focus of abnormal radiotracer uptake at the medial aspect of the right knee is favored degenerative, consider correlation with plain films. Electronically Signed   By: Dahlia Bailiff M.D.   On: 09/12/2022 13:36   DG Chest 2 View  Result Date: 09/09/2022 CLINICAL DATA:  Chronic left-sided chest and rib pain since October. EXAM: CHEST - 2 VIEW COMPARISON:  Chest x-ray dated August 04, 2022. FINDINGS: Normal heart size. Unchanged large hiatal hernia. No focal consolidation, pleural effusion, or pneumothorax. No acute osseous abnormality. IMPRESSION: 1. No acute cardiopulmonary disease. 2. Unchanged large hiatal hernia. Electronically Signed   By: Titus Dubin M.D.   On: 09/09/2022 12:49      ELIGIBLE FOR AVAILABLE RESEARCH PROTOCOL: no   ASSESSMENT: 72 y.o. [place], [place] woman status post right breast biopsy 08/02/2018 for a clinical T3 N0, stage IIA invasive lobular carcinoma, grade 2, estrogen receptor strongly positive, progesterone receptor 1% positive, with no HER-2 amplification and an MIB-1 of 1%.  (a) CT scan of the head and chest, without contrast 08/23/2018 showed nonspecific 0.4 cm left lower lobe pulmonary nodule, no definitive metastatic disease  (b) bone scan 08/23/2018 shows multiple spinal areas of abnormal uptake, but  (c) total spinal MRI 09/03/2018 finds bone scan findings to be secondary to degenerative disease, no evidence of metastatic disease.  (1) status post right mastectomy and sentinel lymph node sampling 09/16/2018 showing a pT3 pN1(mic), stage IIA-IIIA invasive lobular breast cancer,  grade 2, with 1 of 5 sampled lymph nodes involved by micrometastatic deposit, with extra nodular extension; ample margins  (2)  MammaPrint "high risk" suggests a 5-year metastasis free survival of 93% with chemotherapy, with an absolute chemotherapy benefit in the greater than 12% range  (3) adjuvant radiation 12/31/2018-02/19/2019:  The right chest wall and regional nodes were treated to 50.4 Gy in 28 fractions followed by a 10 Gy boost over 5 fractions.   (4) anastrozole started neoadjuvantly (on 08/14/2018), discontinued 10/14/2018 in preparation for chemotherapy, resumed October 2020  (a) bone density 10/13/2016 showed osteopenia with a lowest score at -2.1  (b) ibandronate/Boniva started January 2021  (c) bone density 11/11/2020 shows a T score of -2.6  (5) Caris testing on mastectomy sample (09/16/2018) showed stable MSI and proficient mismatch repair status, with a low mutational burden; BRCA 1 and 2 were not mutated, PI K3 was not mutated, ER B B2 was not mutated, and AKT 1 was not mutated.  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 Will proceed with PET Left sided rib  pain is very hard to manage, will consult Dr Lisbeth Renshaw to see if there is role in radiation Pain management, will change vycodin to [facility] for better pain control.  She will stop Vicodin since this has not been working well for her. Change antiestrogen therapy to FASLODEX She understands this is not curable, she understands there is a need for possible biopsy to confirm the pathology. Drug induced constipation, from narcotics NO new  dental concerns, she will continue boniva. Nausea, unspecified, will send some zofran PRN.  Total time spent: 30 minutes  *Total Encounter Time as defined by the Centers for Medicare and Medicaid Services includes, in addition to the face-to-face time of a patient visit (documented in the note above) non-face-to-face time: obtaining and reviewing outside history, ordering and reviewing medications, tests or procedures, care coordination (communications with other health care professionals or caregivers) and documentation in the medical record.

## 2022-09-19 ENCOUNTER — Ambulatory Visit: Payer: Medicare Other | Admitting: Nurse Practitioner

## 2022-09-19 DIAGNOSIS — G629 Polyneuropathy, unspecified: Secondary | ICD-10-CM | POA: Diagnosis not present

## 2022-09-19 DIAGNOSIS — G8929 Other chronic pain: Secondary | ICD-10-CM | POA: Diagnosis not present

## 2022-09-19 DIAGNOSIS — S72091A Other fracture of head and neck of right femur, initial encounter for closed fracture: Secondary | ICD-10-CM | POA: Diagnosis not present

## 2022-09-19 DIAGNOSIS — M79604 Pain in right leg: Secondary | ICD-10-CM | POA: Diagnosis not present

## 2022-09-19 DIAGNOSIS — K5900 Constipation, unspecified: Secondary | ICD-10-CM | POA: Diagnosis not present

## 2022-09-19 DIAGNOSIS — Z170 Estrogen receptor positive status [ER+]: Secondary | ICD-10-CM | POA: Diagnosis not present

## 2022-09-19 DIAGNOSIS — I480 Paroxysmal atrial fibrillation: Secondary | ICD-10-CM | POA: Diagnosis not present

## 2022-09-19 DIAGNOSIS — R600 Localized edema: Secondary | ICD-10-CM | POA: Diagnosis not present

## 2022-09-19 DIAGNOSIS — M25551 Pain in right hip: Secondary | ICD-10-CM | POA: Diagnosis not present

## 2022-09-19 DIAGNOSIS — S72001A Fracture of unspecified part of neck of right femur, initial encounter for closed fracture: Secondary | ICD-10-CM | POA: Diagnosis not present

## 2022-09-19 DIAGNOSIS — K219 Gastro-esophageal reflux disease without esophagitis: Secondary | ICD-10-CM | POA: Diagnosis not present

## 2022-09-19 DIAGNOSIS — I10 Essential (primary) hypertension: Secondary | ICD-10-CM | POA: Diagnosis not present

## 2022-09-19 DIAGNOSIS — M810 Age-related osteoporosis without current pathological fracture: Secondary | ICD-10-CM | POA: Diagnosis not present

## 2022-09-19 DIAGNOSIS — Y92009 Unspecified place in unspecified non-institutional (private) residence as the place of occurrence of the external cause: Secondary | ICD-10-CM | POA: Diagnosis not present

## 2022-09-19 DIAGNOSIS — Z7901 Long term (current) use of anticoagulants: Secondary | ICD-10-CM | POA: Diagnosis not present

## 2022-09-19 DIAGNOSIS — M25561 Pain in right knee: Secondary | ICD-10-CM | POA: Diagnosis not present

## 2022-09-19 DIAGNOSIS — Z66 Do not resuscitate: Secondary | ICD-10-CM | POA: Diagnosis not present

## 2022-09-19 DIAGNOSIS — C50911 Malignant neoplasm of unspecified site of right female breast: Secondary | ICD-10-CM | POA: Diagnosis not present

## 2022-09-19 DIAGNOSIS — E669 Obesity, unspecified: Secondary | ICD-10-CM | POA: Diagnosis not present

## 2022-09-19 DIAGNOSIS — S7001XA Contusion of right hip, initial encounter: Secondary | ICD-10-CM | POA: Diagnosis not present

## 2022-09-19 DIAGNOSIS — C7951 Secondary malignant neoplasm of bone: Secondary | ICD-10-CM | POA: Diagnosis not present

## 2022-09-19 DIAGNOSIS — Z6839 Body mass index (BMI) 39.0-39.9, adult: Secondary | ICD-10-CM | POA: Diagnosis not present

## 2022-09-19 DIAGNOSIS — F319 Bipolar disorder, unspecified: Secondary | ICD-10-CM | POA: Diagnosis not present

## 2022-09-19 DIAGNOSIS — W19XXXA Unspecified fall, initial encounter: Secondary | ICD-10-CM | POA: Diagnosis not present

## 2022-09-21 ENCOUNTER — Inpatient Hospital Stay: Payer: BC Managed Care – PPO

## 2022-09-21 ENCOUNTER — Telehealth: Payer: Self-pay | Admitting: *Deleted

## 2022-09-21 ENCOUNTER — Telehealth: Payer: Self-pay | Admitting: Hematology and Oncology

## 2022-09-21 NOTE — Telephone Encounter (Signed)
Per Dr. Chryl Heck request - Scheduled PET for 3/29 AT 11:20 at [facility].  Contacted patient contacted with PET appt info. Patient and family verbalized understanding of information. Patient states she's at [facility]. She fractured hip yesterday, had surgery.   Cxld today's appt for 1st Faslodex injection per patient request.   Ms. Liddy's appt with Dr. Chryl Heck r/s from 3/28 to 4/2 per MD request to have PET results available. Patient verbalized understanding Will now be scheduled for 1st Faslodex on 4/2 per Dr. Chryl Heck.   Patient informed of appt change to 4/2 for MD/inj and that future inj appts will change - schedule message sent r/t injections.   Patient repeated and confirmed all information related to appts.

## 2022-09-21 NOTE — Telephone Encounter (Signed)
Spoke with patient confirming upcoming appointments  

## 2022-09-22 ENCOUNTER — Telehealth: Payer: Self-pay | Admitting: Radiation Oncology

## 2022-09-22 NOTE — Telephone Encounter (Signed)
3/15 @ 8:50 am spoke to patient to be schedule for consult with Dr. Lisbeth Renshaw. Patient stated she fell and broke her hip and is currently at [facility] and after discharge will be going into rehab.  She accepting consult over the phone at this time. Patient will call us back if anything changes.

## 2022-09-25 ENCOUNTER — Telehealth: Payer: Self-pay | Admitting: *Deleted

## 2022-09-25 DIAGNOSIS — S72044D Nondisplaced fracture of base of neck of right femur, subsequent encounter for closed fracture with routine healing: Secondary | ICD-10-CM | POA: Diagnosis not present

## 2022-09-25 DIAGNOSIS — M1611 Unilateral primary osteoarthritis, right hip: Secondary | ICD-10-CM | POA: Diagnosis not present

## 2022-09-25 DIAGNOSIS — C50411 Malignant neoplasm of upper-outer quadrant of right female breast: Secondary | ICD-10-CM | POA: Diagnosis not present

## 2022-09-25 DIAGNOSIS — Y92009 Unspecified place in unspecified non-institutional (private) residence as the place of occurrence of the external cause: Secondary | ICD-10-CM | POA: Diagnosis not present

## 2022-09-25 DIAGNOSIS — D6489 Other specified anemias: Secondary | ICD-10-CM | POA: Diagnosis not present

## 2022-09-25 DIAGNOSIS — W19XXXA Unspecified fall, initial encounter: Secondary | ICD-10-CM | POA: Diagnosis not present

## 2022-09-25 DIAGNOSIS — G629 Polyneuropathy, unspecified: Secondary | ICD-10-CM | POA: Diagnosis not present

## 2022-09-25 DIAGNOSIS — I10 Essential (primary) hypertension: Secondary | ICD-10-CM | POA: Diagnosis not present

## 2022-09-25 DIAGNOSIS — C7951 Secondary malignant neoplasm of bone: Secondary | ICD-10-CM | POA: Diagnosis not present

## 2022-09-25 DIAGNOSIS — M810 Age-related osteoporosis without current pathological fracture: Secondary | ICD-10-CM | POA: Diagnosis not present

## 2022-09-25 DIAGNOSIS — R2689 Other abnormalities of gait and mobility: Secondary | ICD-10-CM | POA: Diagnosis not present

## 2022-09-25 DIAGNOSIS — M1711 Unilateral primary osteoarthritis, right knee: Secondary | ICD-10-CM | POA: Diagnosis not present

## 2022-09-25 DIAGNOSIS — I480 Paroxysmal atrial fibrillation: Secondary | ICD-10-CM | POA: Diagnosis not present

## 2022-09-25 DIAGNOSIS — J4520 Mild intermittent asthma, uncomplicated: Secondary | ICD-10-CM | POA: Diagnosis not present

## 2022-09-25 DIAGNOSIS — M797 Fibromyalgia: Secondary | ICD-10-CM | POA: Diagnosis not present

## 2022-09-25 DIAGNOSIS — G2581 Restless legs syndrome: Secondary | ICD-10-CM | POA: Diagnosis not present

## 2022-09-25 DIAGNOSIS — S72001D Fracture of unspecified part of neck of right femur, subsequent encounter for closed fracture with routine healing: Secondary | ICD-10-CM | POA: Diagnosis not present

## 2022-09-25 DIAGNOSIS — F319 Bipolar disorder, unspecified: Secondary | ICD-10-CM | POA: Diagnosis not present

## 2022-09-25 DIAGNOSIS — S72001A Fracture of unspecified part of neck of right femur, initial encounter for closed fracture: Secondary | ICD-10-CM | POA: Diagnosis not present

## 2022-09-25 NOTE — Telephone Encounter (Signed)
This RN was contacted by Dr Tawana Scale resident at [facility] in [facility] wanted to let Dr Chryl Heck know that the patient came in last week with fractured hip requiring surgery.  Pt is being discharged to rehab and concern is possible delay in treatment due to current issue.  Return call if needed for Dr Romero Liner is [phone].  This note will be forwarded to MD - currently pt is scheduled for visit with MD and  faslodex on 10/10/2022.

## 2022-09-27 ENCOUNTER — Telehealth: Payer: Self-pay | Admitting: *Deleted

## 2022-09-27 NOTE — Telephone Encounter (Signed)
This RN called and spoke with pt to assess her current status and how to best follow up-  Note pt states she " had a slip and have a fracture down by leg and they put 3 screws in it "  She states "they are meeting today about my plan and will decide about my discharge then and let me know"  Doris Smith states she is noticing pain in ribs as well - and is eager to start therapy as per discussion she understands it will help her feel better.  Plan is for Doris Smith to call this RN to inform her of discharge date and if she will be d/ced home or to  a rehab facility.  Presently her appt for faslodex is on 4/2.

## 2022-10-02 ENCOUNTER — Telehealth: Payer: Self-pay | Admitting: *Deleted

## 2022-10-02 NOTE — Telephone Encounter (Signed)
This RN spoke with pt per her call- she states she should be discharged from rehab on 3/27 and is planning on having PET scan and keeping appointments for 10/10/2022.   No further needs at this time.

## 2022-10-03 ENCOUNTER — Encounter: Payer: Self-pay | Admitting: Hematology and Oncology

## 2022-10-05 ENCOUNTER — Inpatient Hospital Stay: Payer: BC Managed Care – PPO | Admitting: Hematology and Oncology

## 2022-10-05 ENCOUNTER — Inpatient Hospital Stay: Payer: BC Managed Care – PPO

## 2022-10-06 ENCOUNTER — Encounter (HOSPITAL_COMMUNITY)
Admission: RE | Admit: 2022-10-06 | Discharge: 2022-10-06 | Disposition: A | Discharge status: Home / Self Care | Payer: BC Managed Care – PPO | Source: Ambulatory Visit | Attending: Hematology and Oncology | Admitting: Hematology and Oncology

## 2022-10-06 DIAGNOSIS — Z170 Estrogen receptor positive status [ER+]: Secondary | ICD-10-CM | POA: Insufficient documentation

## 2022-10-06 DIAGNOSIS — C7951 Secondary malignant neoplasm of bone: Secondary | ICD-10-CM | POA: Diagnosis not present

## 2022-10-06 DIAGNOSIS — C50411 Malignant neoplasm of upper-outer quadrant of right female breast: Secondary | ICD-10-CM | POA: Diagnosis not present

## 2022-10-06 LAB — GLUCOSE, CAPILLARY: Glucose-Capillary: 117 mg/dL — ABNORMAL HIGH (ref 70–99)

## 2022-10-06 MED ORDER — FLUDEOXYGLUCOSE F - 18 (FDG) INJECTION
11.5800 | Freq: Once | INTRAVENOUS | Status: AC | PRN
  Administered 2022-10-06: 11.58 via INTRAVENOUS

## 2022-10-09 ENCOUNTER — Telehealth: Payer: Self-pay

## 2022-10-09 DIAGNOSIS — K589 Irritable bowel syndrome without diarrhea: Secondary | ICD-10-CM | POA: Diagnosis not present

## 2022-10-09 DIAGNOSIS — I480 Paroxysmal atrial fibrillation: Secondary | ICD-10-CM | POA: Diagnosis not present

## 2022-10-09 DIAGNOSIS — S72001D Fracture of unspecified part of neck of right femur, subsequent encounter for closed fracture with routine healing: Secondary | ICD-10-CM | POA: Diagnosis not present

## 2022-10-09 DIAGNOSIS — K449 Diaphragmatic hernia without obstruction or gangrene: Secondary | ICD-10-CM | POA: Diagnosis not present

## 2022-10-09 DIAGNOSIS — G629 Polyneuropathy, unspecified: Secondary | ICD-10-CM | POA: Diagnosis not present

## 2022-10-09 NOTE — Telephone Encounter (Signed)
Calling to let you know they have been in contact with the patient about setting up home health and she doesn ot want them to come out until Friday, 10/13/22.  Just making you aware.

## 2022-10-10 ENCOUNTER — Telehealth: Payer: Self-pay | Admitting: *Deleted

## 2022-10-10 ENCOUNTER — Other Ambulatory Visit: Payer: Self-pay

## 2022-10-10 ENCOUNTER — Inpatient Hospital Stay: Payer: BC Managed Care – PPO

## 2022-10-10 ENCOUNTER — Inpatient Hospital Stay (HOSPITAL_BASED_OUTPATIENT_CLINIC_OR_DEPARTMENT_OTHER): Payer: BC Managed Care – PPO | Admitting: Hematology and Oncology

## 2022-10-10 ENCOUNTER — Telehealth: Payer: Self-pay | Admitting: Nurse Practitioner

## 2022-10-10 VITALS — BP 136/50 | HR 93 | Temp 98.8°F | Resp 18 | Ht 64.0 in | Wt 226.8 lb

## 2022-10-10 DIAGNOSIS — C50411 Malignant neoplasm of upper-outer quadrant of right female breast: Secondary | ICD-10-CM | POA: Insufficient documentation

## 2022-10-10 DIAGNOSIS — Z515 Encounter for palliative care: Secondary | ICD-10-CM | POA: Insufficient documentation

## 2022-10-10 DIAGNOSIS — Z510 Encounter for antineoplastic radiation therapy: Secondary | ICD-10-CM | POA: Insufficient documentation

## 2022-10-10 DIAGNOSIS — Z79899 Other long term (current) drug therapy: Secondary | ICD-10-CM | POA: Insufficient documentation

## 2022-10-10 DIAGNOSIS — C50811 Malignant neoplasm of overlapping sites of right female breast: Secondary | ICD-10-CM | POA: Insufficient documentation

## 2022-10-10 DIAGNOSIS — Z79811 Long term (current) use of aromatase inhibitors: Secondary | ICD-10-CM | POA: Insufficient documentation

## 2022-10-10 DIAGNOSIS — C7951 Secondary malignant neoplasm of bone: Secondary | ICD-10-CM | POA: Insufficient documentation

## 2022-10-10 DIAGNOSIS — Z170 Estrogen receptor positive status [ER+]: Secondary | ICD-10-CM | POA: Insufficient documentation

## 2022-10-10 MED ORDER — FULVESTRANT 250 MG/5ML IM SOSY
500.0000 mg | PREFILLED_SYRINGE | Freq: Once | INTRAMUSCULAR | Status: AC
  Administered 2022-10-10: 500 mg via INTRAMUSCULAR
  Filled 2022-10-10: qty 10

## 2022-10-10 NOTE — Telephone Encounter (Signed)
Per 4/2 IB reached out to patient to schedule; patient aware of date and time of appointment.,

## 2022-10-10 NOTE — Patient Instructions (Signed)

## 2022-10-10 NOTE — Telephone Encounter (Signed)
   Pre-operative Risk Assessment    Patient Name: Doris Smith  DOB: Nov 25, 1950 MRN: [id number]      Request for Surgical Clearance    Procedure:   CT BX  Date of Surgery:  Clearance TBD                                 Surgeon:  Dr. Aletta Edouard Surgeon's Group or Practice Name:  [facility] Phone number:  [phone] Fax number:  [phone]   Type of Clearance Requested:   - Medical  - Pharmacy:  Hold Apixaban (Eliquis) 48 hours prior   Type of Anesthesia:   Moderate Sedation   Additional requests/questions:    Signed, Greer Ee   10/10/2022, 3:56 PM

## 2022-10-10 NOTE — Telephone Encounter (Signed)
Pharmacy please advise on holding Eliquis prior to CT biopsy scheduled for TBD. Thank you.

## 2022-10-10 NOTE — Progress Notes (Signed)
Doris Edouard, MD  Doris Smith D PROCEDURE / BIOPSY REVIEW Date: 10/10/22  Requested Biopsy site: Bone lesion. I would favor either iliac bone, but multiple options. Reason for request: Metastatic breast CA Imaging review: Best seen on PET  Decision: Approved Imaging modality to perform: CT Schedule with: Moderate Sedation Schedule for: Any VIR  Additional comments:  Please contact me with questions, concerns, or if issue pertaining to this request arise.  Doris Roup, MD [facility]

## 2022-10-10 NOTE — Progress Notes (Signed)
[place]  [phone] [phone]    ID: Doris Smith DOB: [birth year]  MR#: [id number]  [id number]  Patient Care Team: Chevis Pretty, FNP as PCP - General (Family Medicine) Lendon Colonel, NP as PCP - Cardiology (Nurse Practitioner) Alphonsa Overall, MD as Consulting Physician (General Surgery) Magrinat, Virgie Dad, MD (Inactive) as Consulting Physician (Oncology) Kyung Rudd, MD as Consulting Physician (Radiation Oncology) Elijio Miles, MD as Consulting Physician (Pulmonary Disease) Netta Cedars, MD as Consulting Physician (Orthopedic Surgery) Susa Day, MD as Consulting Physician (Orthopedic Surgery) Janeth Rase, NP as Nurse Practitioner (Adult Health Nurse Practitioner) Mauro Kaufmann, RN as Oncology Nurse Navigator Rockwell Germany, RN as Oncology Nurse Navigator Irene Limbo, MD as Consulting Physician (Plastic Surgery) Minus Breeding, MD as Consulting Physician (Cardiology) Benay Pike, MD OTHER MD: Chapman Moss, psychiatry   CHIEF COMPLAINT: Estrogen receptor positive breast cancer (s/p right mastectomy)  CURRENT TREATMENT: anastrozole; ibandronate  INTERVAL HISTORY:  Doris Smith returns today for follow-up of her estrogen receptor positive breast cancer.  She is here for follow up with her husband. Since her last visit here, she was diagnosed with right nondisplaced femoral neck fracture treated with open reduction and internal fixation 09/19/2022.  I do not see any biopsies or surgical pathology obtained from this visit.  She states the pain in the left chest wall is not well-controlled despite taking oxycodone however she has only been taking it once a day.  She also had a PET/CT a couple days ago and is here to review the results. [id number] Dr Marlyce Huge is her dentist, she had a dental visit last year, continues on Boniva for osteo porosis.  Rest of the pertinent 10 point ROS reviewed and  negative   HISTORY OF CURRENT ILLNESS: From the original intake note:  "Doris Smith" presented to her PCP, Dr. Hassell Done, with right breast pain, fullness, and nipple inversion since November 2019. She proceeded to undergo bilateral diagnostic mammography with tomography and right breast ultrasonography at [facility] on 07/31/2018 showing: findings compatible with multicentric breast cancer spanning at least the upper inner and upper outer quadrants of the right breast; normal right axilla. Palpable firmness of approximately 1.5 cm was noted in the 9 o'clock position, which was also seen on ultrasound with indisctinct margins measuring 2.7 x 2.3 x 2.2 cm. At 1 o'clock, a mass with poorly defined margins measures 1.9 x 1 x 0.8 cm. Additional poorly defined masses were seen in the 12 o'clock retroareolar region.  Accordingly on 08/02/2018 she proceeded to biopsy of the right breast area in question. The pathology ([id number]) from this procedure showed: invasive mammary carcinoma at 9 o'clock and 1 o'clock; e-cadherin negative, consistent with lobular phenotype; grade 2-3. Prognostic indicators significant for: estrogen receptor, 90% positive and progesterone receptor, 1% positive, both with strong staining intensity. Proliferation marker Ki67 at 1%. HER2 equivocal by immunohistochemistry, 2+ but negative by fluorescent in situ hybridization with a signals ratio 1.04 and number per cell 1.2.   The patient's subsequent history is as detailed below.   PAST MEDICAL HISTORY: Past Medical History:  Diagnosis Date   A-fib ([place]) 11/2012   PAF   Anxiety    takes Ativan   Asthma 04/07/2011   dx   Bipolar disorder ([place])    Cancer ([place])    Right breast   Depression    Early cataracts, bilateral    Fatty liver    Fibromyalgia    GERD (gastroesophageal reflux disease)    Irritable  bowel syndrome    Mental disorder    dx bipolar   PONV (postoperative nausea and vomiting)    Sleep apnea 04/2011    does  not wear CPAP  She has chronic sinus headaches, hiatal hernia   PAST SURGICAL HISTORY: Past Surgical History:  Procedure Laterality Date   ABDOMINAL HYSTERECTOMY     COLONOSCOPY     DILATION AND CURETTAGE OF UTERUS  1981   abnormal pap   KNEE ARTHROSCOPY Left 2007   x 2   LUMBAR LAMINECTOMY/DECOMPRESSION MICRODISCECTOMY  05/31/2011   Procedure: LUMBAR LAMINECTOMY/DECOMPRESSION MICRODISCECTOMY;  Surgeon: Johnn Hai;  Location: [facility];  Service: Orthopedics;  Laterality: Left;  Decompression Lumbar four to five and  Lumbar five to Sacral one on Left  (X-Ray)   MASTECTOMY W/ SENTINEL NODE BIOPSY Right 09/16/2018   Procedure: RIGHT MASTECTOMY WITH RIGHT AXILLARY SENTINEL LYMPH NODE BIOPSY;  Surgeon: Alphonsa Overall, MD;  Location: [facility];  Service: General;  Laterality: Right;   PARTIAL HYSTERECTOMY  1982   PORTACATH PLACEMENT Left 10/31/2018   Procedure: INSERTION PORT-A-CATH WITH ULTRASOUND;  Surgeon: Alphonsa Overall, MD;  Location: [facility];  Service: General;  Laterality: Left;   TONSILLECTOMY     as child   TOTAL KNEE ARTHROPLASTY Left 12/18/2005    FAMILY HISTORY Family History  Problem Relation Age of Onset   Anesthesia problems Mother    Heart disease Mother        CHF, atrial fib   Heart failure Mother    Anesthesia problems Sister    Colon polyps Father    Heart disease Father        "Fluid around the heart"   Hypertension Sister    Breast cancer Maternal Aunt    Colon cancer Maternal Aunt    Prostate cancer Neg Hx    Ovarian cancer Neg Hx   As of February 2019, patient father is alive at 58 years old. Patient mother died at age 32 in 05/29/20. She notes a family hx of breast cancer. A maternal aunt was diagnosed with breast cancer, but Doris Smith is unsure if it was in one or both breasts. She has 3 siblings, 3 sisters and 0 brothers.   GYNECOLOGIC HISTORY:  No LMP recorded. Patient is postmenopausal. Menarche: 72 years old Age at first live birth: 72  years old [place] P 2 LMP about 42 years ago Contraceptive no HRT no  Hysterectomy? partial So? no   SOCIAL HISTORY:  Doris Smith worked in [employer] for 15 years. She is on disability because her back, knees, and nerves. Her husband, Doris Smith, has been at [employer] 43 years as a Freight forwarder.  Even though their home is in [place] they are actually living in [place] in an apartment while her husband works there.  Son Doris Smith, age 60, works as a Chief Strategy Officer in [place], [place]. Son Doris Smith, age 38, works as a Building control surveyor in [place], [place]. They have 4 grandchildren, 2 great-grandchildren with 2 more on the way. She attends a [employer].   ADVANCED DIRECTIVES: In the absence of any documents to the contrary the patient's husband is her healthcare power of attorney   HEALTH MAINTENANCE: Social History   Tobacco Use   Smoking status: Never   Smokeless tobacco: Never  Vaping Use   Vaping Use: Never used  Substance Use Topics   Alcohol use: No   Drug use: No     Colonoscopy: 2019  PAP: 11/03/2014, normal  Bone density: 10/13/2016, T-score  of -2.1   Allergies  Allergen Reactions   Iohexol     "over 20 years ago" had reaction "hurting in arm and heart"-Done in [place], [place].Marland Kitchenphysician stopped CT at that time.   Codeine Other (See Comments)    Makes feel like she is wild    Palonosetron Other (See Comments)    headache   Prednisone Other (See Comments)    Makes me very irritable   Sulfonamide Derivatives Other (See Comments)    Upset stomach   Celecoxib Nausea And Vomiting   Sulfa Antibiotics Nausea And Vomiting   Vitamin B12 Rash    Current Outpatient Medications  Medication Sig Dispense Refill   anastrozole (ARIMIDEX) 1 MG tablet Take 1 tablet (1 mg total) by mouth daily. 90 tablet 4   apixaban (ELIQUIS) 5 MG TABS tablet Take 1 tablet (5 mg total) by mouth 2 (two) times daily. 180 tablet 1   clotrimazole-betamethasone (LOTRISONE) cream APPLY  CREAM TOPICALLY TWICE DAILY 45 g 0    esomeprazole (NEXIUM) 40 MG capsule Take 1 capsule (40 mg total) by mouth daily. 90 capsule 1   fluticasone (FLONASE) 50 MCG/ACT nasal spray Place 2 sprays into both nostrils daily. 16 g 6   furosemide (LASIX) 20 MG tablet Take 1 tablet (20 mg total) by mouth daily. 90 tablet 1   gabapentin (NEURONTIN) 300 MG capsule Take 1 capsule (300 mg total) by mouth 3 (three) times daily. 90 capsule 5   HYDROcodone-acetaminophen (NORCO/VICODIN) 5-325 MG tablet Take 1 tablet by mouth every 6 (six) hours as needed for severe pain. 15 tablet 0   ibandronate (BONIVA) 150 MG tablet Take 1 tablet (150 mg total) by mouth every 30 (thirty) days. Take in the morning with a full glass of water, on an empty stomach, and do not take anything else by mouth or lie down for the next 30 min. 12 tablet 0   loratadine (CLARITIN) 10 MG tablet Take 10 mg by mouth daily.     Lurasidone HCl (LATUDA) 60 MG TABS TAKE 1 TABLET BY MOUTH ONCE DAILY 90 tablet 1   metoprolol tartrate (LOPRESSOR) 25 MG tablet Take 1 tablet (25 mg total) by mouth 2 (two) times daily. 180 tablet 1   midodrine (PROAMATINE) 2.5 MG tablet Take 1 tablet (2.5 mg total) by mouth 3 (three) times daily with meals. 270 tablet 3   morphine (MSIR) 15 MG tablet Take 1 tablet (15 mg total) by mouth every 6 (six) hours as needed for severe pain. 60 tablet 0   ondansetron (ZOFRAN) 8 MG tablet Take 1 tablet (8 mg total) by mouth every 8 (eight) hours as needed for nausea. 30 tablet 3   pramipexole (MIRAPEX) 1 MG tablet TAKE 1 TABLET BY MOUTH THREE TIMES DAILY 90 tablet 0   prochlorperazine (COMPAZINE) 10 MG tablet Take 1 tablet by mouth daily.     tizanidine (ZANAFLEX) 2 MG capsule Take 2 mg by mouth 3 (three) times daily as needed.     triamcinolone (KENALOG) 0.1 % Apply 1 application. topically 2 (two) times daily.     Vilazodone HCl (VIIBRYD) 40 MG TABS Take 1 tablet (40 mg total) by mouth daily. 90 tablet 1   No current facility-administered medications for this visit.    Facility-Administered Medications Ordered in Other Visits  Medication Dose Route Frequency Provider Last Rate Last Admin   heparin lock flush 100 unit/mL  500 Units Intravenous Once Magrinat, Virgie Dad, MD       sodium chloride flush (  NS) 0.9 % injection 10 mL  10 mL Intravenous PRN Magrinat, Virgie Dad, MD        OBJECTIVE: white woman who appears stated age  Vitals:   10/10/22 0850  BP: (!) 136/50  Pulse: 93  Resp: 18  Temp: 98.8 F (37.1 C)  SpO2: 99%   Wt Readings from Last 3 Encounters:  10/10/22 226 lb 12.8 oz (102.9 kg)  09/18/22 232 lb 9.6 oz (105.5 kg)  09/09/22 234 lb (106.1 kg)   Body mass index is 38.93 kg/m.    ECOG FS:2 - Symptomatic, <50% confined to bed  Physical Exam Constitutional:      Appearance: Normal appearance.  Chest:  Breasts:    Left: No mass or tenderness.     Comments: Palpable bone tenderness left chest wall below the breast. Musculoskeletal:     Cervical back: No rigidity.  Lymphadenopathy:     Cervical: No cervical adenopathy.  Neurological:     Mental Status: She is alert.       LAB RESULTS:  CMP     Component Value Date/Time   NA 141 09/18/2022 1041   NA 145 (H) 06/05/2022 1024   K 3.6 09/18/2022 1041   CL 105 09/18/2022 1041   CO2 29 09/18/2022 1041   GLUCOSE 119 (H) 09/18/2022 1041   BUN 15 09/18/2022 1041   BUN 17 06/05/2022 1024   CREATININE 0.94 09/18/2022 1041   CREATININE 1.07 (H) 08/25/2022 0921   CALCIUM 9.6 09/18/2022 1041   PROT 7.1 09/18/2022 1041   PROT 6.6 06/05/2022 1024   ALBUMIN 3.8 09/18/2022 1041   ALBUMIN 4.0 06/05/2022 1024   AST 25 09/18/2022 1041   AST 22 08/25/2022 0921   ALT 26 09/18/2022 1041   ALT 26 08/25/2022 0921   ALKPHOS 139 (H) 09/18/2022 1041   BILITOT 0.7 09/18/2022 1041   BILITOT 0.5 08/25/2022 0921   GFRNONAA >60 09/18/2022 1041   GFRNONAA 56 (L) 08/25/2022 0921   GFRAA 81 02/03/2020 1049   GFRAA >60 12/19/2018 1434    No results found for: "TOTALPROTELP",  "ALBUMINELP", "A1GS", "A2GS", "BETS", "BETA2SER", "GAMS", "MSPIKE", "SPEI"  No results found for: "KPAFRELGTCHN", "LAMBDASER", "KAPLAMBRATIO"  Lab Results  Component Value Date   WBC 5.9 09/18/2022   NEUTROABS 4.6 09/18/2022   HGB 10.4 (L) 09/18/2022   HCT 31.1 (L) 09/18/2022   MCV 90.1 09/18/2022   PLT 210 09/18/2022   No results found for: "LABCA2"  No components found for: "[id number]"  No results for input(s): "INR" in the last 168 hours.  No results found for: "LABCA2"  No results found for: "CAN199"  No results found for: "CAN125"  Lab Results  Component Value Date   CAN153 656.0 (H) 08/25/2022    Lab Results  Component Value Date   CA2729 2,169.4 (H) 08/25/2022    No components found for: "HGQUANT"  No results found for: "CEA1", "CEA" / No results found for: "CEA1", "CEA"   No results found for: "AFPTUMOR"  No results found for: "CHROMOGRNA"  No results found for: "HGBA", "HGBA2QUANT", "HGBFQUANT", "HGBSQUAN" (Hemoglobinopathy evaluation)   No results found for: "LDH"  No results found for: "IRON", "TIBC", "IRONPCTSAT" (Iron and TIBC)  No results found for: "FERRITIN"  Urinalysis    Component Value Date/Time   COLORURINE YELLOW 12/19/2018 1355   APPEARANCEUR Clear 02/03/2020 1045   LABSPEC 1.016 12/19/2018 1355   PHURINE 5.0 12/19/2018 1355   GLUCOSEU Negative 02/03/2020 [place] 12/19/2018 [place]  Negative 02/03/2020 [place] 12/19/2018 1355   PROTEINUR Negative 02/03/2020 [place] 12/19/2018 1355   UROBILINOGEN negative 11/03/2014 1039   UROBILINOGEN 0.2 05/25/2011 0923   NITRITE Negative 02/03/2020 1045   NITRITE NEGATIVE 12/19/2018 1355   LEUKOCYTESUR Negative 02/03/2020 1045   LEUKOCYTESUR SMALL (A) 12/19/2018 1355    STUDIES: NM PET Image Initial (PI) Skull Base To Thigh  Result Date: 10/09/2022 CLINICAL DATA:  Subsequent treatment strategy for breast cancer with possible  osseous metastases. EXAM: NUCLEAR MEDICINE PET SKULL BASE TO THIGH TECHNIQUE: 11.6 mCi F-18 FDG was injected intravenously. Full-ring PET imaging was performed from the skull base to thigh after the radiotracer. CT data was obtained and used for attenuation correction and anatomic localization. Fasting blood glucose: 117 mg/dl COMPARISON:  All body bone scan dated 09/12/2022 FINDINGS: Mediastinal blood pool activity: SUV max 3.1 Liver activity: SUV max NA NECK: No hypermetabolic cervical lymphadenopathy. Incidental CT findings: None. CHEST: Status post right mastectomy. No hypermetabolic thoracic lymphadenopathy. No suspicious pulmonary nodules. Incidental CT findings: None. ABDOMEN/PELVIS: No abnormal hypermetabolism in the liver, spleen, pancreas, or adrenal glands. No hypermetabolic abdominopelvic lymphadenopathy. Incidental CT findings: Large hiatal hernia/inverted intrathoracic stomach. Status post hysterectomy. SKELETON: Widespread hypermetabolism involving the thoracolumbar spine and pelvis, diffuse but heterogeneous, likely reflecting osseous metastases. Focal hypermetabolism involving the left clavicular head, left scapula, left proximal humerus, sternum, right inferior scapula, and multiple bilateral ribs, suggesting additional metastases. Representative hypermetabolism includes: --Medial left clavicle, max SUV 6.2 --Sternum, max SUV 6.9 --T8 vertebral body, max SUV 6.8 --Left lateral 6th rib, max SUV 6.8 --L5 vertebral body, max SUV 8.2 Incidental CT findings: Status post ORIF of the right hip. Degenerative changes of the visualized thoracolumbar spine. IMPRESSION: Status post right mastectomy. Widespread osseous metastases, as above. Electronically Signed   By: Julian Hy M.D.   On: 10/09/2022 02:19   NM Bone Scan Whole Body  Result Date: 09/12/2022 CLINICAL DATA:  Metastatic breast cancer workup status post right mastectomy 09/16/2018 and chemo. New onset left-sided rib and back pain. EXAM:  NUCLEAR MEDICINE WHOLE BODY BONE SCAN TECHNIQUE: Whole body anterior and posterior images were obtained approximately 3 hours after intravenous injection of radiopharmaceutical. RADIOPHARMACEUTICALS:  21.6 mCi Technetium-70m MDP IV COMPARISON:  Nuclear medicine bone scan August 23, 2018 FINDINGS: New multifocal foci of abnormal radiotracer uptake involving the right second, sixth and eighth ribs, the left third fourth eighth ninth and tenth ribs as well as the manubrium/sternum. Focus of abnormal uptake at the medial aspect of the right knee Multifocal radiotracer uptake involving the shoulders, spine and hips in a pattern most consistent with degenerative arthropathy. Photopenia along the left knee likely reflects a knee prosthesis. Otherwise physiologic distribution of radiotracer activity. IMPRESSION: New multifocal abnormal radiotracer uptake involving the bilateral ribs and manubrium/sternum concerning for osteoblastic metastatic disease, consider correlation with CT. Focus of abnormal radiotracer uptake at the medial aspect of the right knee is favored degenerative, consider correlation with plain films. Electronically Signed   By: Dahlia Bailiff M.D.   On: 09/12/2022 13:36      ELIGIBLE FOR AVAILABLE RESEARCH PROTOCOL: no   ASSESSMENT: 72 y.o. [place], [place] woman status post right breast biopsy 08/02/2018 for a clinical T3 N0, stage IIA invasive lobular carcinoma, grade 2, estrogen receptor strongly positive, progesterone receptor 1% positive, with no HER-2 amplification and an MIB-1 of 1%.  (a) CT scan of the head and chest, without contrast 08/23/2018 showed nonspecific 0.4 cm left lower lobe pulmonary  nodule, no definitive metastatic disease  (b) bone scan 08/23/2018 shows multiple spinal areas of abnormal uptake, but  (c) total spinal MRI 09/03/2018 finds bone scan findings to be secondary to degenerative disease, no evidence of metastatic disease.  (1) status post right mastectomy and  sentinel lymph node sampling 09/16/2018 showing a pT3 pN1(mic), stage IIA-IIIA invasive lobular breast cancer, grade 2, with 1 of 5 sampled lymph nodes involved by micrometastatic deposit, with extra nodular extension; ample margins  (2)  MammaPrint "high risk" suggests a 5-year metastasis free survival of 93% with chemotherapy, with an absolute chemotherapy benefit in the greater than 12% range  (3) adjuvant radiation 12/31/2018-02/19/2019:  The right chest wall and regional nodes were treated to 50.4 Gy in 28 fractions followed by a 10 Gy boost over 5 fractions.   (4) anastrozole started neoadjuvantly (on 08/14/2018), discontinued 10/14/2018 in preparation for chemotherapy, resumed October 2020  (a) bone density 10/13/2016 showed osteopenia with a lowest score at -2.1  (b) ibandronate/Boniva started January 2021  (c) bone density 11/11/2020 shows a T score of -2.6  (5) [facility] testing on mastectomy sample (09/16/2018) showed stable MSI and proficient mismatch repair status, with a low mutational burden; BRCA 1 and 2 were not mutated, PI K3 was not mutated, ER B B2 was not mutated, and AKT 1 was not mutated.  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 PET showed diffuse bone mets.  We will plan to proceed with bone biopsy to confirm histology as well as prognostics, in the past she had ER +90% strong staining PR 1% invasive lobular carcinoma, HER2 negative. She understands this is not curable, we have however discussed several options for treatment.    NO new dental concerns, she was on boniva. We will start her on zometa every 12 weeks. She understands the risk of osteonecrosis of the jaw, She gives verbal consent to proceed. Referral made to palliative care for pain management.  She will continue her oxycodone every 8 hours as needed for pain management in the meantime.  We have tried morphine which did not work well for her.  We have tried hydrocodone which did not work well for her.  RTC in 3 weeks with me, hopefully the biopsy will happen in the meantime.  Will plan to send Caris or foundation 1 testing on the surgical specimen. If pain is not well-controlled despite optimizing pain medication, we can discuss with Dr. Lisbeth Renshaw if there is any role for palliative radiation.  Total time spent: 30 minutes  *Total Encounter Time as defined by the Centers for Medicare and Medicaid Services includes, in addition to the face-to-face time of a patient visit (documented in the note above) non-face-to-face time: obtaining and reviewing outside history, ordering and reviewing medications, tests or procedures, care coordination (communications with other health care professionals or caregivers) and documentation in the medical record.

## 2022-10-11 ENCOUNTER — Telehealth: Payer: Self-pay

## 2022-10-11 NOTE — Telephone Encounter (Signed)
Spoke with patient who is agreeable to do a tele visit on 4/9 at 2:20 pm. Med rec and consent have been done.

## 2022-10-11 NOTE — Telephone Encounter (Signed)
   Name: Doris Smith  DOB: 04/15/1951  MRN: [id number]  Primary Cardiologist: Jory Sims, NP   Preoperative team, please contact this patient and set up a phone call appointment for further preoperative risk assessment. Please obtain consent and complete medication review. Thank you for your help.  I confirm that guidance regarding antiplatelet and oral anticoagulation therapy has been completed and, if necessary, noted below.  Per office protocol, patient can hold Eliquis for 2 days prior to procedure as requested.    Mable Fill, Marissa Nestle, NP 10/11/2022, 9:44 AM [place]

## 2022-10-11 NOTE — Addendum Note (Signed)
Addended by: Jacinta Shoe on: 10/11/2022 11:56 AM   Modules accepted: Orders

## 2022-10-11 NOTE — Telephone Encounter (Signed)
  Patient Consent for Virtual Visit        Doris Smith has provided verbal consent on 10/11/2022 for a virtual visit (video or telephone).   CONSENT FOR VIRTUAL VISIT FOR:  Doris Smith  By participating in this virtual visit I agree to the following:  I hereby voluntarily request, consent and authorize [facility] and its employed or contracted physicians, physician assistants, nurse practitioners or other licensed health care professionals (the Practitioner), to provide me with telemedicine health care services (the "Services") as deemed necessary by the treating Practitioner. I acknowledge and consent to receive the Services by the Practitioner via telemedicine. I understand that the telemedicine visit will involve communicating with the Practitioner through live audiovisual communication technology and the disclosure of certain medical information by electronic transmission. I acknowledge that I have been given the opportunity to request an in-person assessment or other available alternative prior to the telemedicine visit and am voluntarily participating in the telemedicine visit.  I understand that I have the right to withhold or withdraw my consent to the use of telemedicine in the course of my care at any time, without affecting my right to future care or treatment, and that the Practitioner or I may terminate the telemedicine visit at any time. I understand that I have the right to inspect all information obtained and/or recorded in the course of the telemedicine visit and may receive copies of available information for a reasonable fee.  I understand that some of the potential risks of receiving the Services via telemedicine include:  Delay or interruption in medical evaluation due to technological equipment failure or disruption; Information transmitted may not be sufficient (e.g. poor resolution of images) to allow for appropriate medical decision making by the  Practitioner; and/or  In rare instances, security protocols could fail, causing a breach of personal health information.  Furthermore, I acknowledge that it is my responsibility to provide information about my medical history, conditions and care that is complete and accurate to the best of my ability. I acknowledge that Practitioner's advice, recommendations, and/or decision may be based on factors not within their control, such as incomplete or inaccurate data provided by me or distortions of diagnostic images or specimens that may result from electronic transmissions. I understand that the practice of medicine is not an exact science and that Practitioner makes no warranties or guarantees regarding treatment outcomes. I acknowledge that a copy of this consent can be made available to me via my patient portal ([place]), or I can request a printed copy by calling the office of [facility].    I understand that my insurance will be billed for this visit.   I have read or had this consent read to me. I understand the contents of this consent, which adequately explains the benefits and risks of the Services being provided via telemedicine.  I have been provided ample opportunity to ask questions regarding this consent and the Services and have had my questions answered to my satisfaction. I give my informed consent for the services to be provided through the use of telemedicine in my medical care

## 2022-10-11 NOTE — Telephone Encounter (Signed)
Patient with diagnosis of Eliquis on afib for anticoagulation.    Procedure: bone biopsy per heme/onc note 4/2 Date of procedure: TBD  CHA2DS2-VASc Score = 4  This indicates a 4.8% annual risk of stroke. The patient's score is based upon: CHF History: 0 HTN History: 1 Diabetes History: 0 Stroke History: 0 Vascular Disease History: 1 Age Score: 1 Gender Score: 1   CrCl 61mL/min using adj body weight Platelet count 215K  Per office protocol, patient can hold Eliquis for 2 days prior to procedure as requested.    **This guidance is not considered finalized until pre-operative APP has relayed final recommendations.**

## 2022-10-12 ENCOUNTER — Encounter: Payer: Self-pay | Admitting: Nurse Practitioner

## 2022-10-12 ENCOUNTER — Ambulatory Visit (INDEPENDENT_AMBULATORY_CARE_PROVIDER_SITE_OTHER): Payer: BC Managed Care – PPO | Admitting: Nurse Practitioner

## 2022-10-12 VITALS — BP 113/69 | HR 71 | Temp 97.3°F | Resp 20 | Ht 64.0 in | Wt 226.0 lb

## 2022-10-12 DIAGNOSIS — S72001D Fracture of unspecified part of neck of right femur, subsequent encounter for closed fracture with routine healing: Secondary | ICD-10-CM | POA: Diagnosis not present

## 2022-10-12 DIAGNOSIS — Z09 Encounter for follow-up examination after completed treatment for conditions other than malignant neoplasm: Secondary | ICD-10-CM

## 2022-10-12 DIAGNOSIS — S72001S Fracture of unspecified part of neck of right femur, sequela: Secondary | ICD-10-CM

## 2022-10-12 NOTE — Progress Notes (Signed)
Radiation Oncology         [phone] ________________________________  Re-Consultation - Conducted via telephone at patient request.  I spoke with the patient to conduct this consult visit via telephone. The patient was notified in advance and was offered an in person or telemedicine meeting to allow for face to face communication but instead preferred to proceed with a telephone consult.   ________________________________  Name: Doris Smith        MRN: [id number]  Date of Service: 10/17/2022 DOB: 1950-08-14  [id number], [name], FNP  Rachel Moulds, MD     REFERRING PHYSICIAN: Rachel Moulds, MD   DIAGNOSIS: The primary encounter diagnosis was Secondary malignant neoplasm of bone. A diagnosis of Malignant neoplasm of upper-outer quadrant of right breast in female, estrogen receptor positive was also pertinent to this visit.   HISTORY OF PRESENT ILLNESS: Doris Smith is a 72 y.o. female is a patient with with a history of stage IB, pT3N1miM0, grade 2 ,ER/PR positive invasive lobular carcinoma of the right breast.  She was originally diagnosed in 2020. She underwent right mastectomy with right axillary lymph node biopsy on 09/16/2018.  Final pathology revealed a grade 2, 10.5 cm invasive lobular carcinoma.  Her resection margins were negative, and 1 of the 5 lymph nodes contained micrometastatic disease.  She did have MammaPrint testing that was high risk, and she subsequently began systemic Cytoxan, 5-FU and methotrexate between 11/05/2018 and 04/02/2019. Chemo was held after 3 cycles to complete postmastectomy radiotherapy.   She resumed anastrozole following chemotherapy.  She was starting to have some symptoms of musculoskeletal pain at the end of last year and chest x-rays in November 2023 were negative for any focal osseous findings this was repeated and no evidence of fracture of her ribs or chest wall findings were noted by x-ray in December 2023 again a chest x-ray in  January 2024 was negative for any focal findings as well as in March 2024.  Given her symptoms however she did undergo a bone scan on 09/12/2022 that showed new focal abnormal tracer uptake involving bilateral ribs, manubrium/sternum, and a focus of abnormal uptake at the medial aspect of the right knee was favored to be degenerative.  A PET scan on 10/06/2022 showed widespread osseous metastatic disease involving the thoracolumbar spine, pelvis, left clavicular head, left scapula, left proximal humerus, sternum, right scapula, bilateral ribs and she had also apparently been treated for nondisplaced femoral neck fracture with ORIF on 09/19/2022 though no pathology was submitted during this procedure.  She is contacted by phone to consider palliative radiotherapy.  Plans have been to proceed with systemic Faslodex due to her progressive disease while on anastrozole.  She is also scheduled to undergo a CT-guided bone biopsy on 10/25/2022 to determine prognostics.   PREVIOUS RADIATION THERAPY:   12/31/2018 through 02/19/2019 The patient initially received a dose of 50.4 Gy in 28 fractions to the right chest wall and supraclavicular region. This was delivered using a 3-D conformal, 4 field technique. The patient then received a boost to the mastectomy scar. This delivered an additional 10 Gy in 5 fractions using an en face electron field. The total dose was 60.4 Gy.   PAST MEDICAL HISTORY:  Past Medical History:  Diagnosis Date   A-fib 11/2012   PAF   Anxiety    takes Ativan   Asthma 04/07/2011   dx   Bipolar disorder    Cancer    Right breast   Depression  Early cataracts, bilateral    Fatty liver    Fibromyalgia    GERD (gastroesophageal reflux disease)    Irritable bowel syndrome    Mental disorder    dx bipolar   PONV (postoperative nausea and vomiting)    Sleep apnea 04/2011    does not wear CPAP       PAST SURGICAL HISTORY: Past Surgical History:  Procedure Laterality Date    ABDOMINAL HYSTERECTOMY     COLONOSCOPY     DILATION AND CURETTAGE OF UTERUS  1981   abnormal pap   KNEE ARTHROSCOPY Left 2007   x 2   LUMBAR LAMINECTOMY/DECOMPRESSION MICRODISCECTOMY  05/31/2011   Procedure: LUMBAR LAMINECTOMY/DECOMPRESSION MICRODISCECTOMY;  Surgeon: Javier Docker;  Location: [facility];  Service: Orthopedics;  Laterality: Left;  Decompression Lumbar four to five and  Lumbar five to Sacral one on Left  (X-Ray)   MASTECTOMY W/ SENTINEL NODE BIOPSY Right 09/16/2018   Procedure: RIGHT MASTECTOMY WITH RIGHT AXILLARY SENTINEL LYMPH NODE BIOPSY;  Surgeon: Ovidio Kin, MD;  Location: [facility] OR;  Service: General;  Laterality: Right;   PARTIAL HYSTERECTOMY  1982   PORTACATH PLACEMENT Left 10/31/2018   Procedure: INSERTION PORT-A-CATH WITH ULTRASOUND;  Surgeon: Ovidio Kin, MD;  Location: [facility];  Service: General;  Laterality: Left;   TONSILLECTOMY     as child   TOTAL KNEE ARTHROPLASTY Left 12/18/2005     FAMILY HISTORY:  Family History  Problem Relation Age of Onset   Anesthesia problems Mother    Heart disease Mother        CHF, atrial fib   Heart failure Mother    Anesthesia problems Sister    Colon polyps Father    Heart disease Father        "Fluid around the heart"   Hypertension Sister    Breast cancer Maternal Aunt    Colon cancer Maternal Aunt    Prostate cancer Neg Hx    Ovarian cancer Neg Hx      SOCIAL HISTORY:  reports that she has never smoked. She has never used smokeless tobacco. She reports that she does not drink alcohol and does not use drugs. The patient is married and is currently staying in [place], though her permanent address is [place].    ALLERGIES: Iohexol, Codeine, Palonosetron, Prednisone, Sulfonamide derivatives, Celecoxib, Sulfa antibiotics, and Vitamin b12   MEDICATIONS:  Current Outpatient Medications  Medication Sig Dispense Refill   anastrozole (ARIMIDEX) 1 MG tablet Take 1 tablet (1 mg total) by mouth daily.  90 tablet 4   apixaban (ELIQUIS) 5 MG TABS tablet Take 1 tablet (5 mg total) by mouth 2 (two) times daily. 180 tablet 1   clotrimazole-betamethasone (LOTRISONE) cream APPLY  CREAM TOPICALLY TWICE DAILY 45 g 0   esomeprazole (NEXIUM) 40 MG capsule Take 1 capsule (40 mg total) by mouth daily. 90 capsule 1   fluticasone (FLONASE) 50 MCG/ACT nasal spray Place 2 sprays into both nostrils daily. 16 g 6   gabapentin (NEURONTIN) 300 MG capsule Take 1 capsule (300 mg total) by mouth 3 (three) times daily. 90 capsule 5   loratadine (CLARITIN) 10 MG tablet Take 10 mg by mouth daily.     Lurasidone HCl (LATUDA) 60 MG TABS TAKE 1 TABLET BY MOUTH ONCE DAILY 90 tablet 1   metoprolol tartrate (LOPRESSOR) 25 MG tablet Take 1 tablet (25 mg total) by mouth 2 (two) times daily. 180 tablet 1   midodrine (PROAMATINE) 2.5 MG tablet Take 1  tablet (2.5 mg total) by mouth 3 (three) times daily with meals. 270 tablet 3   ondansetron (ZOFRAN) 8 MG tablet Take 1 tablet (8 mg total) by mouth every 8 (eight) hours as needed for nausea. 30 tablet 3   pramipexole (MIRAPEX) 1 MG tablet TAKE 1 TABLET BY MOUTH THREE TIMES DAILY 90 tablet 0   prochlorperazine (COMPAZINE) 10 MG tablet Take 1 tablet by mouth daily.     triamcinolone (KENALOG) 0.1 % Apply 1 application. topically 2 (two) times daily.     Vilazodone HCl (VIIBRYD) 40 MG TABS Take 1 tablet (40 mg total) by mouth daily. 90 tablet 1   furosemide (LASIX) 20 MG tablet Take 1 tablet (20 mg total) by mouth daily. (Patient not taking: Reported on 10/11/2022) 90 tablet 1   tizanidine (ZANAFLEX) 2 MG capsule Take 2 mg by mouth 3 (three) times daily as needed. (Patient not taking: Reported on 10/11/2022)     No current facility-administered medications for this encounter.   Facility-Administered Medications Ordered in Other Encounters  Medication Dose Route Frequency Provider Last Rate Last Admin   heparin lock flush 100 unit/mL  500 Units Intravenous Once Magrinat, Valentino Hue, MD        sodium chloride flush (NS) 0.9 % injection 10 mL  10 mL Intravenous PRN Magrinat, Valentino Hue, MD         REVIEW OF SYSTEMS: On review of systems, the patient reports that she is doing fair. She describes significant pain that is not helped very well by her oxycodone. She describes that she's been in a recliner for the last two weeks due to her pain in her lower back area that extends across from the level of the navel. She denies any loss of sensation, weakness, or changes in control of bowel or bladder activity. She reports the left chest wall area underneath her left arm down where she is also having terrible shooting pain and this goes all the way down to the level of her waist. She has had some right sided chest wall pain but the left is worst. No hip pain is noted. She's lost about 5 pounds in the last month, and about 10 pounds in the last two months. She is very tired. No other complaints are verbalized.      PHYSICAL EXAM:  Wt Readings from Last 3 Encounters:  10/17/22 226 lb (102.5 kg)  10/12/22 226 lb (102.5 kg)  10/10/22 226 lb 12.8 oz (102.9 kg)   Temp Readings from Last 3 Encounters:  10/12/22 (!) 97.3 F (36.3 C) (Temporal)  10/10/22 98.8 F (37.1 C) (Temporal)  09/18/22 98.1 F (36.7 C) (Temporal)   BP Readings from Last 3 Encounters:  10/12/22 113/69  10/10/22 (!) 136/50  09/18/22 (!) 140/56   Pulse Readings from Last 3 Encounters:  10/12/22 71  10/10/22 93  09/18/22 79   Pain Assessment Pain Score: 10-Worst pain ever Pain Loc: Back/10  Unable to assess due to nature of encounter.   ECOG = 3  0 - Asymptomatic (Fully active, able to carry on all predisease activities without restriction)  1 - Symptomatic but completely ambulatory (Restricted in physically strenuous activity but ambulatory and able to carry out work of a light or sedentary nature. For example, light housework, office work)  2 - Symptomatic, <50% in bed during the day (Ambulatory and capable  of all self care but unable to carry out any work activities. Up and about more than 50% of waking hours)  3 - Symptomatic, >50% in bed, but not bedbound (Capable of only limited self-care, confined to bed or chair 50% or more of waking hours)  4 - Bedbound (Completely disabled. Cannot carry on any self-care. Totally confined to bed or chair)  5 - Death   Santiago Glad MM, Creech RH, Tormey DC, et al. [phone]). "Toxicity and response criteria of the [facility]". Am. Evlyn Clines. Oncol. 5 (6): 649-55    LABORATORY DATA:  Lab Results  Component Value Date   WBC 5.9 09/18/2022   HGB 10.4 (L) 09/18/2022   HCT 31.1 (L) 09/18/2022   MCV 90.1 09/18/2022   PLT 210 09/18/2022   Lab Results  Component Value Date   NA 141 09/18/2022   K 3.6 09/18/2022   CL 105 09/18/2022   CO2 29 09/18/2022   Lab Results  Component Value Date   ALT 26 09/18/2022   AST 25 09/18/2022   ALKPHOS 139 (H) 09/18/2022   BILITOT 0.7 09/18/2022      RADIOGRAPHY: NM PET Image Initial (PI) Skull Base To Thigh  Result Date: 10/09/2022 CLINICAL DATA:  Subsequent treatment strategy for breast cancer with possible osseous metastases. EXAM: NUCLEAR MEDICINE PET SKULL BASE TO THIGH TECHNIQUE: 11.6 mCi F-18 FDG was injected intravenously. Full-ring PET imaging was performed from the skull base to thigh after the radiotracer. CT data was obtained and used for attenuation correction and anatomic localization. Fasting blood glucose: 117 mg/dl COMPARISON:  All body bone scan dated 09/12/2022 FINDINGS: Mediastinal blood pool activity: SUV max 3.1 Liver activity: SUV max NA NECK: No hypermetabolic cervical lymphadenopathy. Incidental CT findings: None. CHEST: Status post right mastectomy. No hypermetabolic thoracic lymphadenopathy. No suspicious pulmonary nodules. Incidental CT findings: None. ABDOMEN/PELVIS: No abnormal hypermetabolism in the liver, spleen, pancreas, or adrenal glands. No hypermetabolic abdominopelvic  lymphadenopathy. Incidental CT findings: Large hiatal hernia/inverted intrathoracic stomach. Status post hysterectomy. SKELETON: Widespread hypermetabolism involving the thoracolumbar spine and pelvis, diffuse but heterogeneous, likely reflecting osseous metastases. Focal hypermetabolism involving the left clavicular head, left scapula, left proximal humerus, sternum, right inferior scapula, and multiple bilateral ribs, suggesting additional metastases. Representative hypermetabolism includes: --Medial left clavicle, max SUV 6.2 --Sternum, max SUV 6.9 --T8 vertebral body, max SUV 6.8 --Left lateral 6th rib, max SUV 6.8 --L5 vertebral body, max SUV 8.2 Incidental CT findings: Status post ORIF of the right hip. Degenerative changes of the visualized thoracolumbar spine. IMPRESSION: Status post right mastectomy. Widespread osseous metastases, as above. Electronically Signed   By: Charline Bills M.D.   On: 10/09/2022 02:19       IMPRESSION/PLAN: 1. Recurrent metastatic Stage IB, pT3N53miM0, grade 2 ,ER/PR positive invasive lobular carcinoma of the right breast. Dr. Mitzi Hansen discusses her course of therapy to date and her imaging and work up [employer]. He recommends palliative radiotherapy to the left chest wall, lower T spine, and right proximal hip.  We discussed the risks, benefits, short, and long term effects of radiotherapy, as well as the palliative intent, and the patient is interested in proceeding. Dr. Mitzi Hansen discusses the delivery and logistics of radiotherapy and anticipates a course of 2 weeks of radiotherapy. She will come tomorrow for simulation and aim to start treatment early next week. She will sign written consent to proceed at that time. 2. Pain secondary to #1. She will meet with palliative today to discuss pain management as well.    This encounter was conducted via telephone.  The patient has provided two factor identification and has given verbal consent for  this type of encounter and has  been advised to only accept a meeting of this type in a secure network environment. The time spent during this encounter was 45 minutes including preparation, discussion, and coordination of the patient's care. The attendants for this meeting include Rober Minion, RN, Dr. Mitzi Hansen, Ronny Bacon  and Albertine Patricia Anne.  During the encounter,  Rober Minion, RN, Dr. Mitzi Hansen, and Ronny Bacon were located at [facility] Radiation Oncology Department.  Doris Smith was located at home.

## 2022-10-12 NOTE — Progress Notes (Signed)
   Subjective:    Patient ID: Doris Smith, female    DOB: 1950/12/24, 72 y.o.   MRN: [id number]   Chief Complaint: hospital follow up  HPI  Patient was admitted on 09/25/22 with right hip fracture. She had hip repaired on 09/20/22. She was discharged home on her pain medication and eliquis daily. She has been doing well sonce gong home. She is still ot able to drive. Has been doing therapy. She [facility] appointment for  cardiac clerance for bone biopsy, because she needs to hold her eliquis prior to procedure.  Patient Active Problem List   Diagnosis Date Noted   Restless leg syndrome 10/03/2021   Anxiety associated with depression 06/10/2020   Port-A-Cath in place 11/12/2018   Malignant neoplasm of upper-outer quadrant of right breast in female, estrogen receptor positive 08/08/2018   Right knee pain 03/01/2015   Aortic atherosclerosis 12/28/2014   Obesity (BMI 30-39.9) 04/09/2014   Insomnia due to stress 04/09/2014   Atrial fibrillation 10/01/2013   GERD (gastroesophageal reflux disease) 10/01/2013   Bipolar disorder 09/27/2007   Essential hypertension 09/27/2007   ALLERGIC RHINITIS 09/27/2007   IRRITABLE BOWEL SYNDROME 09/27/2007   Arthropathy 09/27/2007   DEGENERATIVE DISC DISEASE, LUMBOSACRAL SPINE 09/27/2007       Review of Systems  Constitutional:  Negative for diaphoresis.  Eyes:  Negative for pain.  Respiratory:  Negative for shortness of breath.   Cardiovascular:  Negative for chest pain, palpitations and leg swelling.  Gastrointestinal:  Negative for abdominal pain.  Endocrine: Negative for polydipsia.  Skin:  Negative for rash.  Neurological:  Negative for dizziness, weakness and headaches.  Hematological:  Does not bruise/bleed easily.  All other systems reviewed and are negative.      Objective:   Physical Exam Constitutional:      Appearance: Normal appearance. She is obese.  Cardiovascular:     Rate and Rhythm: Normal rate and regular  rhythm.     Heart sounds: Normal heart sounds.  Pulmonary:     Effort: Pulmonary effort is normal.     Breath sounds: Normal breath sounds.  Skin:    General: Skin is warm.  Neurological:     General: No focal deficit present.     Mental Status: She is alert and oriented to person, place, and time.  Psychiatric:        Mood and Affect: Mood normal.        Behavior: Behavior normal.    BP 113/69   Pulse 71   Temp (!) 97.3 F (36.3 C) (Temporal)   Resp 20   Ht 5\' 4"  (1.626 m)   Wt 226 lb (102.5 kg)   SpO2 98%   BMI 38.79 kg/m         Assessment & Plan:   Anders Simmonds Perren in today with chief complaint of Hospitalization Follow-up   1. Closed fracture of right hip, sequela Doing well. Still taking therapy  2. Hospital discharge follow-up Hospital records reviewed    The above assessment and management plan was discussed with the patient. The patient verbalized understanding of and has agreed to the management plan. Patient is aware to call the clinic if symptoms persist or worsen. Patient is aware when to return to the clinic for a follow-up visit. Patient educated on when it is appropriate to go to the emergency department.   [name] Done, FNP

## 2022-10-13 ENCOUNTER — Other Ambulatory Visit: Payer: Self-pay | Admitting: Nurse Practitioner

## 2022-10-13 DIAGNOSIS — Z9181 History of falling: Secondary | ICD-10-CM | POA: Diagnosis not present

## 2022-10-13 DIAGNOSIS — I480 Paroxysmal atrial fibrillation: Secondary | ICD-10-CM | POA: Diagnosis not present

## 2022-10-13 DIAGNOSIS — G4733 Obstructive sleep apnea (adult) (pediatric): Secondary | ICD-10-CM | POA: Diagnosis not present

## 2022-10-13 DIAGNOSIS — M80051D Age-related osteoporosis with current pathological fracture, right femur, subsequent encounter for fracture with routine healing: Secondary | ICD-10-CM | POA: Diagnosis not present

## 2022-10-13 DIAGNOSIS — Z853 Personal history of malignant neoplasm of breast: Secondary | ICD-10-CM | POA: Diagnosis not present

## 2022-10-13 DIAGNOSIS — F4323 Adjustment disorder with mixed anxiety and depressed mood: Secondary | ICD-10-CM | POA: Diagnosis not present

## 2022-10-13 DIAGNOSIS — D649 Anemia, unspecified: Secondary | ICD-10-CM | POA: Diagnosis not present

## 2022-10-13 DIAGNOSIS — S72001S Fracture of unspecified part of neck of right femur, sequela: Secondary | ICD-10-CM | POA: Diagnosis not present

## 2022-10-13 DIAGNOSIS — Z7901 Long term (current) use of anticoagulants: Secondary | ICD-10-CM | POA: Diagnosis not present

## 2022-10-13 DIAGNOSIS — K589 Irritable bowel syndrome without diarrhea: Secondary | ICD-10-CM | POA: Diagnosis not present

## 2022-10-13 DIAGNOSIS — G2581 Restless legs syndrome: Secondary | ICD-10-CM

## 2022-10-13 DIAGNOSIS — G629 Polyneuropathy, unspecified: Secondary | ICD-10-CM | POA: Diagnosis not present

## 2022-10-13 DIAGNOSIS — Z85830 Personal history of malignant neoplasm of bone: Secondary | ICD-10-CM | POA: Diagnosis not present

## 2022-10-13 DIAGNOSIS — J45909 Unspecified asthma, uncomplicated: Secondary | ICD-10-CM | POA: Diagnosis not present

## 2022-10-16 DIAGNOSIS — J45909 Unspecified asthma, uncomplicated: Secondary | ICD-10-CM | POA: Diagnosis not present

## 2022-10-16 DIAGNOSIS — D649 Anemia, unspecified: Secondary | ICD-10-CM | POA: Diagnosis not present

## 2022-10-16 DIAGNOSIS — S72001S Fracture of unspecified part of neck of right femur, sequela: Secondary | ICD-10-CM | POA: Diagnosis not present

## 2022-10-16 DIAGNOSIS — Z853 Personal history of malignant neoplasm of breast: Secondary | ICD-10-CM | POA: Diagnosis not present

## 2022-10-16 DIAGNOSIS — F4323 Adjustment disorder with mixed anxiety and depressed mood: Secondary | ICD-10-CM | POA: Diagnosis not present

## 2022-10-16 DIAGNOSIS — G629 Polyneuropathy, unspecified: Secondary | ICD-10-CM | POA: Diagnosis not present

## 2022-10-16 DIAGNOSIS — Z7901 Long term (current) use of anticoagulants: Secondary | ICD-10-CM | POA: Diagnosis not present

## 2022-10-16 DIAGNOSIS — Z9181 History of falling: Secondary | ICD-10-CM | POA: Diagnosis not present

## 2022-10-16 DIAGNOSIS — M80051D Age-related osteoporosis with current pathological fracture, right femur, subsequent encounter for fracture with routine healing: Secondary | ICD-10-CM | POA: Diagnosis not present

## 2022-10-16 DIAGNOSIS — Z85830 Personal history of malignant neoplasm of bone: Secondary | ICD-10-CM | POA: Diagnosis not present

## 2022-10-16 DIAGNOSIS — G4733 Obstructive sleep apnea (adult) (pediatric): Secondary | ICD-10-CM | POA: Diagnosis not present

## 2022-10-16 DIAGNOSIS — I480 Paroxysmal atrial fibrillation: Secondary | ICD-10-CM | POA: Diagnosis not present

## 2022-10-16 DIAGNOSIS — K589 Irritable bowel syndrome without diarrhea: Secondary | ICD-10-CM | POA: Diagnosis not present

## 2022-10-16 NOTE — Progress Notes (Incomplete)
New Breast Cancer Diagnosis: Right Breast UOQ   Histology per Pathology Report: grade 2, Invasive Lobular Carcinoma  Receptor Status: ER(positive), PR (positive), Her2-neu (negative), Ki-(1%)   Surgeon and surgical plan, if any:  Dr. Ezzard Standing -Right Mastectomy with right axillary SLN biopsy 09/16/2022   Medical oncologist, treatment if any:    Family History of Breast/Ovarian/Prostate Cancer:   Lymphedema issues, if any:      Pain issues, if any:     SAFETY ISSUES: Prior radiation?  Pacemaker/ICD?  Possible current pregnancy? Postmenopausal Is the patient on methotrexate?   Current Complaints / other details:

## 2022-10-17 ENCOUNTER — Encounter: Payer: Self-pay | Admitting: Radiation Oncology

## 2022-10-17 ENCOUNTER — Ambulatory Visit
Admission: RE | Admit: 2022-10-17 | Discharge: 2022-10-17 | Disposition: A | Discharge status: Home / Self Care | Payer: BC Managed Care – PPO | Source: Ambulatory Visit | Attending: Radiation Oncology | Admitting: Radiation Oncology

## 2022-10-17 ENCOUNTER — Ambulatory Visit: Payer: BC Managed Care – PPO | Attending: Cardiovascular Disease

## 2022-10-17 ENCOUNTER — Inpatient Hospital Stay: Payer: BC Managed Care – PPO | Admitting: Nurse Practitioner

## 2022-10-17 VITALS — Ht 64.0 in | Wt 226.0 lb

## 2022-10-17 DIAGNOSIS — C7951 Secondary malignant neoplasm of bone: Secondary | ICD-10-CM

## 2022-10-17 DIAGNOSIS — Z170 Estrogen receptor positive status [ER+]: Secondary | ICD-10-CM | POA: Diagnosis not present

## 2022-10-17 DIAGNOSIS — C50411 Malignant neoplasm of upper-outer quadrant of right female breast: Secondary | ICD-10-CM

## 2022-10-17 DIAGNOSIS — F410 Panic disorder [episodic paroxysmal anxiety] without agoraphobia: Secondary | ICD-10-CM | POA: Diagnosis not present

## 2022-10-17 DIAGNOSIS — Z01810 Encounter for preprocedural cardiovascular examination: Secondary | ICD-10-CM

## 2022-10-17 DIAGNOSIS — F4310 Post-traumatic stress disorder, unspecified: Secondary | ICD-10-CM | POA: Diagnosis not present

## 2022-10-17 DIAGNOSIS — F312 Bipolar disorder, current episode manic severe with psychotic features: Secondary | ICD-10-CM | POA: Diagnosis not present

## 2022-10-17 NOTE — Progress Notes (Signed)
Virtual Visit via Telephone Note   Because of Doris Smith's co-morbid illnesses, she is at least at moderate risk for complications without adequate follow up.  This format is felt to be most appropriate for this patient at this time.  The patient did not have access to video technology/had technical difficulties with video requiring transitioning to audio format only (telephone).  All issues noted in this document were discussed and addressed.  No physical exam could be performed with this format.  Please refer to the patient's chart for her consent to telehealth for [facility].  Evaluation Performed:  Preoperative cardiovascular risk assessment _____________   Date:  10/17/2022   Patient ID:  Kanasha Dessources, DOB 10/28/1950, MRN [id number] Patient Location:  Home Provider location:   Office  Primary Care Provider:  [name], FNP Primary Cardiologist:  Joni Reining, NP  Chief Complaint / Patient Profile   72 y.o. y/o female with a h/o paroxysmal atrial fibrillation, obesity, dizziness who is pending bone biopsy and presents today for telephonic preoperative cardiovascular risk assessment.  History of Present Illness    Doris Smith is a 72 y.o. female who presents via audio/video conferencing for a telehealth visit today.  Pt was last seen in cardiology clinic on 12/06/2021 by Dr. Antoine Poche.  At that time Jhordyn Zahrt Harps was stable from a cardiac standpoint.  The patient is now pending procedure as outlined above. Since her last visit, she remained stable from a cardiac standpoint.  Today she denies chest pain, shortness of breath, lower extremity edema, fatigue, palpitations, melena, hematuria, hemoptysis, diaphoresis, weakness, presyncope, syncope, orthopnea, and PND.   Past Medical History    Past Medical History:  Diagnosis Date   A-fib 11/2012   PAF   Anxiety    takes Ativan   Asthma 04/07/2011   dx   Bipolar disorder     Cancer    Right breast   Depression    Early cataracts, bilateral    Fatty liver    Fibromyalgia    GERD (gastroesophageal reflux disease)    Irritable bowel syndrome    Mental disorder    dx bipolar   PONV (postoperative nausea and vomiting)    Sleep apnea 04/2011    does not wear CPAP   Past Surgical History:  Procedure Laterality Date   ABDOMINAL HYSTERECTOMY     COLONOSCOPY     DILATION AND CURETTAGE OF UTERUS  1981   abnormal pap   KNEE ARTHROSCOPY Left 2007   x 2   LUMBAR LAMINECTOMY/DECOMPRESSION MICRODISCECTOMY  05/31/2011   Procedure: LUMBAR LAMINECTOMY/DECOMPRESSION MICRODISCECTOMY;  Surgeon: Javier Docker;  Location: [facility];  Service: Orthopedics;  Laterality: Left;  Decompression Lumbar four to five and  Lumbar five to Sacral one on Left  (X-Ray)   MASTECTOMY W/ SENTINEL NODE BIOPSY Right 09/16/2018   Procedure: RIGHT MASTECTOMY WITH RIGHT AXILLARY SENTINEL LYMPH NODE BIOPSY;  Surgeon: Ovidio Kin, MD;  Location: [facility] OR;  Service: General;  Laterality: Right;   PARTIAL HYSTERECTOMY  1982   PORTACATH PLACEMENT Left 10/31/2018   Procedure: INSERTION PORT-A-CATH WITH ULTRASOUND;  Surgeon: Ovidio Kin, MD;  Location: [facility];  Service: General;  Laterality: Left;   TONSILLECTOMY     as child   TOTAL KNEE ARTHROPLASTY Left 12/18/2005    Allergies  Allergies  Allergen Reactions   Iohexol     "over 20 years ago" had reaction "hurting in arm and heart"-Done in [place], [place].Marland Kitchenphysician stopped CT  at that time.   Codeine Other (See Comments)    Makes feel like she is wild    Palonosetron Other (See Comments)    headache   Prednisone Other (See Comments)    Makes me very irritable   Sulfonamide Derivatives Other (See Comments)    Upset stomach   Celecoxib Nausea And Vomiting   Sulfa Antibiotics Nausea And Vomiting   Vitamin B12 Rash    Home Medications    Prior to Admission medications   Medication Sig Start Date End Date Taking? Authorizing  Provider  anastrozole (ARIMIDEX) 1 MG tablet Take 1 tablet (1 mg total) by mouth daily. 06/05/22   Daphine Deutscher [name], FNP  apixaban (ELIQUIS) 5 MG TABS tablet Take 1 tablet (5 mg total) by mouth 2 (two) times daily. 06/05/22   [name], FNP  clotrimazole-betamethasone (LOTRISONE) cream APPLY  CREAM TOPICALLY TWICE DAILY 10/02/19   [name], FNP  esomeprazole (NEXIUM) 40 MG capsule Take 1 capsule (40 mg total) by mouth daily. 06/05/22   Daphine Deutscher, [name], FNP  fluticasone (FLONASE) 50 MCG/ACT nasal spray Place 2 sprays into both nostrils daily. 05/30/21   Daphine Deutscher, [name], FNP  furosemide (LASIX) 20 MG tablet Take 1 tablet (20 mg total) by mouth daily. Patient not taking: Reported on 10/11/2022 06/05/22   [name], FNP  gabapentin (NEURONTIN) 300 MG capsule Take 1 capsule (300 mg total) by mouth 3 (three) times daily. 06/05/22   Daphine Deutscher, [name], FNP  loratadine (CLARITIN) 10 MG tablet Take 10 mg by mouth daily.    [provider]  Lurasidone HCl (LATUDA) 60 MG TABS TAKE 1 TABLET BY MOUTH ONCE DAILY 06/05/22   Daphine Deutscher, [name], FNP  metoprolol tartrate (LOPRESSOR) 25 MG tablet Take 1 tablet (25 mg total) by mouth 2 (two) times daily. 06/05/22   Daphine Deutscher, [name], FNP  midodrine (PROAMATINE) 2.5 MG tablet Take 1 tablet (2.5 mg total) by mouth 3 (three) times daily with meals. 12/30/21   Rollene Rotunda, MD  ondansetron (ZOFRAN) 8 MG tablet Take 1 tablet (8 mg total) by mouth every 8 (eight) hours as needed for nausea. 09/18/22   Rachel Moulds, MD  pramipexole (MIRAPEX) 1 MG tablet TAKE 1 TABLET BY MOUTH THREE TIMES DAILY 10/13/22   Daphine Deutscher, [name], FNP  prochlorperazine (COMPAZINE) 10 MG tablet Take 1 tablet by mouth daily. 04/19/21   [provider]  tizanidine (ZANAFLEX) 2 MG capsule Take 2 mg by mouth 3 (three) times daily as needed. Patient not taking: Reported on 10/11/2022 11/26/21   [provider]   triamcinolone (KENALOG) 0.1 % Apply 1 application. topically 2 (two) times daily. 06/21/20   [provider]  Vilazodone HCl (VIIBRYD) 40 MG TABS Take 1 tablet (40 mg total) by mouth daily. 06/05/22   [name], FNP    Physical Exam    Vital Signs:  Roosevelt Bisher Cadiente does not have vital signs available for review today.  Given telephonic nature of communication, physical exam is limited. AAOx3. NAD. Normal affect.  Speech and respirations are unlabored.  Accessory Clinical Findings    None  Assessment & Plan    1.  Preoperative Cardiovascular Risk Assessment:bone biopsy per heme/onc note 4/2 ,  [facility],Dr. Irish Lack       Primary Cardiologist: Joni Reining, NP  Chart reviewed as part of pre-operative protocol coverage. Given past medical history and time since last visit, based on ACC/AHA guidelines, Tamorah Hada Rowland would be at acceptable risk for the planned procedure without further  cardiovascular testing.    Per office protocol, patient can hold Eliquis for 2 days prior to procedure as requested.   Patient was advised that if she develops new symptoms prior to surgery to contact our office to arrange a follow-up appointment.  She verbalized understanding.  I will route this recommendation to the requesting party via Epic fax function and remove from pre-op pool.       Time:   Today, I have spent 5 minutes with the patient with telehealth technology discussing medical history, symptoms, and management plan.  Prior to her phone evaluation I spent greater than 10 minutes reviewing her past medical history and cardiac medications.   Ronney AstersJesse M Nehal Witting, NP  10/17/2022, 7:48 AM

## 2022-10-18 ENCOUNTER — Ambulatory Visit
Admission: RE | Admit: 2022-10-18 | Discharge: 2022-10-18 | Disposition: A | Discharge status: Home / Self Care | Payer: BC Managed Care – PPO | Source: Ambulatory Visit | Attending: Radiation Oncology | Admitting: Radiation Oncology

## 2022-10-18 DIAGNOSIS — Z510 Encounter for antineoplastic radiation therapy: Secondary | ICD-10-CM | POA: Insufficient documentation

## 2022-10-18 DIAGNOSIS — C50411 Malignant neoplasm of upper-outer quadrant of right female breast: Secondary | ICD-10-CM | POA: Diagnosis not present

## 2022-10-18 DIAGNOSIS — C7951 Secondary malignant neoplasm of bone: Secondary | ICD-10-CM | POA: Diagnosis not present

## 2022-10-18 DIAGNOSIS — Z79899 Other long term (current) drug therapy: Secondary | ICD-10-CM | POA: Diagnosis not present

## 2022-10-18 DIAGNOSIS — Z79811 Long term (current) use of aromatase inhibitors: Secondary | ICD-10-CM | POA: Diagnosis not present

## 2022-10-18 DIAGNOSIS — Z170 Estrogen receptor positive status [ER+]: Secondary | ICD-10-CM | POA: Insufficient documentation

## 2022-10-18 DIAGNOSIS — Z515 Encounter for palliative care: Secondary | ICD-10-CM | POA: Diagnosis not present

## 2022-10-19 ENCOUNTER — Inpatient Hospital Stay: Payer: BC Managed Care – PPO

## 2022-10-19 NOTE — Progress Notes (Signed)
Palliative Medicine [facility]  [phone] [phone]   Name: Doris Smith Date: 10/19/2022 MRN: [id number]  DOB: [birth year]  Patient Care Team: [name], FNP as PCP - General (Family Medicine) Jodelle Gross, NP as PCP - Cardiology (Nurse Practitioner) Ovidio Kin, MD as Consulting Physician (General Surgery) Dorothy Puffer, MD as Consulting Physician (Radiation Oncology) Lysle Pearl, MD as Consulting Physician (Pulmonary Disease) Beverely Low, MD as Consulting Physician (Orthopedic Surgery) Jene Every, MD as Consulting Physician (Orthopedic Surgery) Oneta Rack, NP as Nurse Practitioner (Adult Health Nurse Practitioner) Pershing Proud, RN as Oncology Nurse Navigator Donnelly Angelica, RN as Oncology Nurse Navigator Glenna Fellows, MD as Consulting Physician (Plastic Surgery) Rollene Rotunda, MD as Consulting Physician (Cardiology) Rachel Moulds, MD as Medical Oncologist (Hematology and Oncology)    REASON FOR CONSULTATION: Doris Smith is a 72 y.o. female with oncologic medical history including estrogen receptor positive breast cancer(07/2018) s/p right mastectomy. PET scan 10/06/22 showing widespread osseous metastasis. Other pertinent history includes atrial fibrillation, asthma, OSA, GERD, fatty liver, fibromyalgia, osteoporosis, chronic headaches, anxiety, depression, and bipolar disorder. Underwent ORIF of right hip 09/20/2022. Palliative ask to see for symptom and pain management and goals of care.    SOCIAL HISTORY:    Doris Smith reports that she has never smoked. She has never used smokeless tobacco. She reports that she does not drink alcohol and does not use drugs.  ADVANCE DIRECTIVES:  Advanced directives on file, Doris Smith, spouse, was identified as Management consultant should patient be unable to make decision for herself.  CODE STATUS: Full code  PAST MEDICAL HISTORY: Past  Medical History:  Diagnosis Date   A-fib 11/2012   PAF   Anxiety    takes Ativan   Asthma 04/07/2011   dx   Bipolar disorder    Cancer    Right breast   Depression    Early cataracts, bilateral    Fatty liver    Fibromyalgia    GERD (gastroesophageal reflux disease)    Irritable bowel syndrome    Mental disorder    dx bipolar   PONV (postoperative nausea and vomiting)    Sleep apnea 04/2011    does not wear CPAP    PAST SURGICAL HISTORY:  Past Surgical History:  Procedure Laterality Date   ABDOMINAL HYSTERECTOMY     COLONOSCOPY     DILATION AND CURETTAGE OF UTERUS  1981   abnormal pap   KNEE ARTHROSCOPY Left 2007   x 2   LUMBAR LAMINECTOMY/DECOMPRESSION MICRODISCECTOMY  05/31/2011   Procedure: LUMBAR LAMINECTOMY/DECOMPRESSION MICRODISCECTOMY;  Surgeon: Javier Docker;  Location: [facility];  Service: Orthopedics;  Laterality: Left;  Decompression Lumbar four to five and  Lumbar five to Sacral one on Left  (X-Ray)   MASTECTOMY W/ SENTINEL NODE BIOPSY Right 09/16/2018   Procedure: RIGHT MASTECTOMY WITH RIGHT AXILLARY SENTINEL LYMPH NODE BIOPSY;  Surgeon: Ovidio Kin, MD;  Location: [facility] OR;  Service: General;  Laterality: Right;   PARTIAL HYSTERECTOMY  1982   PORTACATH PLACEMENT Left 10/31/2018   Procedure: INSERTION PORT-A-CATH WITH ULTRASOUND;  Surgeon: Ovidio Kin, MD;  Location: [facility];  Service: General;  Laterality: Left;   TONSILLECTOMY     as child   TOTAL KNEE ARTHROPLASTY Left 12/18/2005    HEMATOLOGY/ONCOLOGY HISTORY:  Oncology History  Malignant neoplasm of upper-outer quadrant of right breast in female, estrogen receptor positive  08/08/2018 Initial Diagnosis   Malignant neoplasm of upper-outer quadrant  of right breast in female, estrogen receptor positive (HCC)   08/2018 - 08/2023 Anti-estrogen oral therapy   Anastrozole; discontinued 10/14/2018 in preparation for chemotherapy, resumed October 2020   09/16/2018 Surgery   Right mastectomy  [name] [phone]): Invasive Lobular Carcinoma, 10.5 cm, grade 2, negative margins. 1 of 5 lymph nodes positive for carcinoma.   10/02/2018 Cancer Staging   Staging form: Breast, AJCC 8th Edition - Pathologic: Stage IB (pT3, pN83mi, cM0, G2, ER+, PR+, HER2-)    11/05/2018 - 03/02/2019 Chemotherapy   palonosetron (ALOXI) injection 0.25 mg, 0.25 mg, Intravenous,  Once, 8 of 8 cycles. Administration: 0.25 mg (11/05/2018), 0.25 mg (11/26/2018), 0.25 mg (12/17/2018), 0.25 mg (01/07/2019), 0.25 mg (01/28/2019), 0.25 mg (02/18/2019), 0.25 mg (03/11/2019), 0.25 mg (04/02/2019)  methotrexate (PF) chemo injection 84 mg, 39.8 mg/m2 = 84.5 mg, Intravenous,  Once, 5 of 5 cycles. Administration: 84 mg (11/05/2018), 84 mg (11/26/2018), 84 mg (12/17/2018), 84 mg (03/11/2019), 84 mg (04/02/2019)  pegfilgrastim-cbqv (UDENYCA) injection 6 mg, 6 mg, Subcutaneous, Once, 6 of 6 cycles. Administration: 6 mg (12/19/2018)  cyclophosphamide (CYTOXAN) 1,260 mg in sodium chloride 0.9 % 250 mL chemo infusion, 600 mg/m2 = 1,260 mg, Intravenous,  Once, 8 of 8 cycle. Administration: 1,260 mg (11/05/2018), 1,260 mg (11/26/2018), 1,260 mg (12/17/2018), 1,260 mg (01/07/2019), 1,260 mg (01/28/2019), 1,260 mg (02/18/2019), 1,260 mg (03/11/2019), 1,260 mg (04/02/2019)  fluorouracil (ADRUCIL) chemo injection 1,250 mg, 600 mg/m2 = 1,250 mg, Intravenous,  Once, 8 of 8 cycles. Administration: 1,250 mg (11/05/2018), 1,250 mg (11/26/2018), 1,250 mg (12/17/2018), 1,250 mg (01/07/2019), 1,250 mg (01/28/2019), 1,250 mg (02/18/2019), 1,250 mg (03/11/2019), 1,250 mg (04/02/2019).    12/31/2018 - 02/19/2019 Radiation Therapy   The patient initially received a dose of 50.4 Gy in 28 fractions to the chest wall and supraclavicular region. This was delivered using a 3-D conformal, 4 field technique. The patient then received a boost to the mastectomy scar. This delivered an additional 10 Gy in 5 fractions using an en face electron field. The total dose was 60.4 Gy.     ALLERGIES:  is  allergic to iohexol, codeine, palonosetron, prednisone, sulfonamide derivatives, celecoxib, sulfa antibiotics, and vitamin b12.  MEDICATIONS:  Current Outpatient Medications  Medication Sig Dispense Refill   anastrozole (ARIMIDEX) 1 MG tablet Take 1 tablet (1 mg total) by mouth daily. 90 tablet 4   apixaban (ELIQUIS) 5 MG TABS tablet Take 1 tablet (5 mg total) by mouth 2 (two) times daily. 180 tablet 1   clotrimazole-betamethasone (LOTRISONE) cream APPLY  CREAM TOPICALLY TWICE DAILY 45 g 0   esomeprazole (NEXIUM) 40 MG capsule Take 1 capsule (40 mg total) by mouth daily. 90 capsule 1   fluticasone (FLONASE) 50 MCG/ACT nasal spray Place 2 sprays into both nostrils daily. 16 g 6   furosemide (LASIX) 20 MG tablet Take 1 tablet (20 mg total) by mouth daily. (Patient not taking: Reported on 10/11/2022) 90 tablet 1   gabapentin (NEURONTIN) 300 MG capsule Take 1 capsule (300 mg total) by mouth 3 (three) times daily. 90 capsule 5   loratadine (CLARITIN) 10 MG tablet Take 10 mg by mouth daily.     Lurasidone HCl (LATUDA) 60 MG TABS TAKE 1 TABLET BY MOUTH ONCE DAILY 90 tablet 1   metoprolol tartrate (LOPRESSOR) 25 MG tablet Take 1 tablet (25 mg total) by mouth 2 (two) times daily. 180 tablet 1   midodrine (PROAMATINE) 2.5 MG tablet Take 1 tablet (2.5 mg total) by mouth 3 (three) times daily with meals. 270 tablet 3  ondansetron (ZOFRAN) 8 MG tablet Take 1 tablet (8 mg total) by mouth every 8 (eight) hours as needed for nausea. 30 tablet 3   pramipexole (MIRAPEX) 1 MG tablet TAKE 1 TABLET BY MOUTH THREE TIMES DAILY 90 tablet 0   prochlorperazine (COMPAZINE) 10 MG tablet Take 1 tablet by mouth daily.     tizanidine (ZANAFLEX) 2 MG capsule Take 2 mg by mouth 3 (three) times daily as needed. (Patient not taking: Reported on 10/11/2022)     triamcinolone (KENALOG) 0.1 % Apply 1 application. topically 2 (two) times daily.     Vilazodone HCl (VIIBRYD) 40 MG TABS Take 1 tablet (40 mg total) by mouth daily. 90 tablet  1   No current facility-administered medications for this visit.   Facility-Administered Medications Ordered in Other Visits  Medication Dose Route Frequency Provider Last Rate Last Admin   heparin lock flush 100 unit/mL  500 Units Intravenous Once Magrinat, Valentino Hue, MD       sodium chloride flush (NS) 0.9 % injection 10 mL  10 mL Intravenous PRN Magrinat, Valentino Hue, MD        VITAL SIGNS: There were no vitals taken for this visit. There were no vitals filed for this visit.  Estimated body mass index is 38.79 kg/m as calculated from the following:   Height as of 10/17/22: 5\' 4"  (1.626 m).   Weight as of 10/17/22: 226 lb (102.5 kg).  LABS: CBC:    Component Value Date/Time   WBC 5.9 09/18/2022 1041   HGB 10.4 (L) 09/18/2022 1041   HGB 12.0 06/05/2022 1024   HCT 31.1 (L) 09/18/2022 1041   HCT 37.4 06/05/2022 1024   PLT 210 09/18/2022 1041   PLT 251 06/05/2022 1024   MCV 90.1 09/18/2022 1041   MCV 86 06/05/2022 1024   NEUTROABS 4.6 09/18/2022 1041   NEUTROABS 5.3 06/05/2022 1024   LYMPHSABS 0.6 (L) 09/18/2022 1041   LYMPHSABS 0.9 06/05/2022 1024   MONOABS 0.5 09/18/2022 1041   EOSABS 0.1 09/18/2022 1041   EOSABS 0.1 06/05/2022 1024   BASOSABS 0.0 09/18/2022 1041   BASOSABS 0.1 06/05/2022 1024   Comprehensive Metabolic Panel:    Component Value Date/Time   NA 141 09/18/2022 1041   NA 145 (H) 06/05/2022 1024   K 3.6 09/18/2022 1041   CL 105 09/18/2022 1041   CO2 29 09/18/2022 1041   BUN 15 09/18/2022 1041   BUN 17 06/05/2022 1024   CREATININE 0.94 09/18/2022 1041   CREATININE 1.07 (H) 08/25/2022 0921   GLUCOSE 119 (H) 09/18/2022 1041   CALCIUM 9.6 09/18/2022 1041   AST 25 09/18/2022 1041   AST 22 08/25/2022 0921   ALT 26 09/18/2022 1041   ALT 26 08/25/2022 0921   ALKPHOS 139 (H) 09/18/2022 1041   BILITOT 0.7 09/18/2022 1041   BILITOT 0.5 08/25/2022 0921   PROT 7.1 09/18/2022 1041   PROT 6.6 06/05/2022 1024   ALBUMIN 3.8 09/18/2022 1041   ALBUMIN 4.0 06/05/2022  1024    RADIOGRAPHIC STUDIES: NM PET Image Initial (PI) Skull Base To Thigh  Result Date: 10/09/2022 CLINICAL DATA:  Subsequent treatment strategy for breast cancer with possible osseous metastases. EXAM: NUCLEAR MEDICINE PET SKULL BASE TO THIGH TECHNIQUE: 11.6 mCi F-18 FDG was injected intravenously. Full-ring PET imaging was performed from the skull base to thigh after the radiotracer. CT data was obtained and used for attenuation correction and anatomic localization. Fasting blood glucose: 117 mg/dl COMPARISON:  All body bone scan dated 09/12/2022 FINDINGS:  Mediastinal blood pool activity: SUV max 3.1 Liver activity: SUV max NA NECK: No hypermetabolic cervical lymphadenopathy. Incidental CT findings: None. CHEST: Status post right mastectomy. No hypermetabolic thoracic lymphadenopathy. No suspicious pulmonary nodules. Incidental CT findings: None. ABDOMEN/PELVIS: No abnormal hypermetabolism in the liver, spleen, pancreas, or adrenal glands. No hypermetabolic abdominopelvic lymphadenopathy. Incidental CT findings: Large hiatal hernia/inverted intrathoracic stomach. Status post hysterectomy. SKELETON: Widespread hypermetabolism involving the thoracolumbar spine and pelvis, diffuse but heterogeneous, likely reflecting osseous metastases. Focal hypermetabolism involving the left clavicular head, left scapula, left proximal humerus, sternum, right inferior scapula, and multiple bilateral ribs, suggesting additional metastases. Representative hypermetabolism includes: --Medial left clavicle, max SUV 6.2 --Sternum, max SUV 6.9 --T8 vertebral body, max SUV 6.8 --Left lateral 6th rib, max SUV 6.8 --L5 vertebral body, max SUV 8.2 Incidental CT findings: Status post ORIF of the right hip. Degenerative changes of the visualized thoracolumbar spine. IMPRESSION: Status post right mastectomy. Widespread osseous metastases, as above. Electronically Signed   By: Charline Bills M.D.   On: 10/09/2022 02:19    PERFORMANCE  STATUS (ECOG) : 2 - Symptomatic, <50% confined to bed  Review of Systems  Constitutional:  Positive for fatigue.  Musculoskeletal:  Positive for back pain.  Unless otherwise noted, a complete review of systems is negative.  Physical Exam General: NAD Cardiovascular: regular rate and rhythm Pulmonary: clear ant fields Abdomen: soft, nontender, + bowel sounds Extremities: no edema, no joint deformities Skin: no rashes Neurological: AAO X4  IMPRESSION: This is my initial visit with Doris Smith. No acute distress noted. Her husband is present. Patient is alert and able to engage appropriately in discussions. In wheelchair.   I introduced myself, Nikki NP, Maygan RN, and Palliative's role in collaboration with the oncology team. Concept of Palliative Care was introduced as specialized medical care for people and their families living with serious illness.  It focuses on providing relief from the symptoms and stress of a serious illness.  The goal is to improve quality of life for both the patient and the family. Values and goals of care important to patient and family were attempted to be elicited.   Doris Smith lives in the home with her husband of more than 52 years.  They have 2 sons as well as grand and great grandchildren.  Christian faith.  At home patient is ambulatory with assistive devices s/p fall requiring hip surgery. Her husband is currently on FMLA assisting with her rehabilitation.  Reports she has recently lost over 18 pounds endorsing early satiety which she relates to her hiatal hernia.  Neoplasm related pain Doris Smith is complaining of constant pain located in her mid to lower area which radiates around to her sides and abdomen.  Also endorsing some rib pain.  Patient reports nothing makes pain better.  She has been unable to sleep in the bed with her husband due to her pain and discomfort.  We discussed her current pain regimen which consist of gabapentin 300 mg 3 times per day,  and oxycodone 5 mg every 6 hours as needed.  She is also taking Tylenol 2-3 times daily.  Patient states she does not take her oxycodone around-the-clock and when she does it tends to make her drowsy.  She is hopeful that she can find some resolution to allow her better pain control but also allow her to be as functional as possible.  She is emotional speaking to her significant discomfort during recent radiation simulation with fear of her ability to tolerate upcoming  treatments.  We discussed premedicating 30-45 minutes prior to visit as needed.  Doris Smith and her husband expressed she would prefer not to have to take her oxycodone around-the-clock however is aware this may be needed for the time being.  I discussed the use of long-acting pain medication to hopefully provide additional relief.  I also discussed hopes of improvement in her symptoms within the next 2-3 weeks of initiating radiation therapy.  I advised as symptoms improve we can safely titrate medications and wean off appropriately.  She and husband verbalized appreciation and knowledge that she would like to at least  try long-acting medication as she has been miserable over the past week.  Education provided on the use of extended release oxycodone with potential side effects, efficacy, and frequency.  Complete physical, medication, medical, and psychosocial review completed.  Patient denies any use of illicit drugs or alcohol. Understands if ever positive this would be grounds for no longer receiving any further opioids. Extensive discussions and explanation of palliative's role in collaboration with his oncology team to assist in his pain and symptom management. Education provided pain contract and guidelines for ongoing support including terms for dismissal if contract is broken. She verbalized understanding. Pain contract completed.   Constipation Doris Smith reports history of IBS.  States her typical bowel pattern is every 3-4 days.  Education  provided on daily or at minimum every other day use of MiraLAX in the setting of opioid use.  She and her husband verbalized understanding.  Goals of care We discussed Doris Smith's current illness and what it means in the larger context of her on-going co-morbidities. Natural disease trajectory and expectations were discussed.   Doris Smith and her husband are realistic in their understanding of her current illness/disease.  She reports that she is remaining hopeful for stability/improvement with upcoming therapies.  Clearly expressed she is not ready to faced end-of-life and wishes to pursue every opportunity to allow her the ability to continue to thrive.  We discussed the importance of continued conversation with family and their medical providers regarding overall plan of care and treatment options, ensuring decisions are within the context of the patients values and GOCs.  PLAN: Established therapeutic relationship. Education provided on palliative's role in collaboration with their Oncology/Radiation team. Education provided on palliative's role in pain and symptom management.  Pain contract completed.  Patient provided with a copy. Dexamethasone 4 mg daily x 7 days then 2 mg daily x 7 days Zofran as needed for nausea Oxycodone 10 mg every 12 hours Percocet 7.5/325 every 6 hours as needed for breakthrough pain.  Discussed with patient to bring medications to appointments as she may need to premedicate prior to her radiation treatments.  Advised to take 30-45 minutes prior to visit as needed. I will plan to see patient back in 1 weeks in collaboration to other oncology appointments.     Patient expressed understanding and was in agreement with this plan. She also understands that She can call the clinic at any time with any questions, concerns, or complaints.   Thank you for your referral and allowing Palliative to assist in Doris Smith's care.   Number and complexity of problems  addressed: HIGH - 1 or more chronic illnesses with SEVERE exacerbation, progression, or side effects of treatment - advanced cancer, pain. Any controlled substances utilized were prescribed in the context of palliative care.   Visit consisted of counseling and education dealing with the complex and emotionally intense issues of  symptom management and palliative care in the setting of serious and potentially life-threatening illness.Greater than 50%  of this time was spent counseling and coordinating care related to the above assessment and plan.   Willette AlmaNikki Pickenpack-Cousar, AGPCNP-BC Palliative Medicine Team/[facility]   *Please note that this is a verbal dictation therefore any spelling or grammatical errors are due to the "[employer] One" system interpretation.

## 2022-10-20 DIAGNOSIS — G629 Polyneuropathy, unspecified: Secondary | ICD-10-CM | POA: Diagnosis not present

## 2022-10-20 DIAGNOSIS — K589 Irritable bowel syndrome without diarrhea: Secondary | ICD-10-CM | POA: Diagnosis not present

## 2022-10-20 DIAGNOSIS — Z853 Personal history of malignant neoplasm of breast: Secondary | ICD-10-CM | POA: Diagnosis not present

## 2022-10-20 DIAGNOSIS — Z7901 Long term (current) use of anticoagulants: Secondary | ICD-10-CM | POA: Diagnosis not present

## 2022-10-20 DIAGNOSIS — M80051D Age-related osteoporosis with current pathological fracture, right femur, subsequent encounter for fracture with routine healing: Secondary | ICD-10-CM | POA: Diagnosis not present

## 2022-10-20 DIAGNOSIS — D649 Anemia, unspecified: Secondary | ICD-10-CM | POA: Diagnosis not present

## 2022-10-20 DIAGNOSIS — S72001S Fracture of unspecified part of neck of right femur, sequela: Secondary | ICD-10-CM | POA: Diagnosis not present

## 2022-10-20 DIAGNOSIS — F4323 Adjustment disorder with mixed anxiety and depressed mood: Secondary | ICD-10-CM | POA: Diagnosis not present

## 2022-10-20 DIAGNOSIS — Z9181 History of falling: Secondary | ICD-10-CM | POA: Diagnosis not present

## 2022-10-20 DIAGNOSIS — I480 Paroxysmal atrial fibrillation: Secondary | ICD-10-CM | POA: Diagnosis not present

## 2022-10-20 DIAGNOSIS — Z85830 Personal history of malignant neoplasm of bone: Secondary | ICD-10-CM | POA: Diagnosis not present

## 2022-10-20 DIAGNOSIS — J45909 Unspecified asthma, uncomplicated: Secondary | ICD-10-CM | POA: Diagnosis not present

## 2022-10-20 DIAGNOSIS — G4733 Obstructive sleep apnea (adult) (pediatric): Secondary | ICD-10-CM | POA: Diagnosis not present

## 2022-10-21 DIAGNOSIS — C7951 Secondary malignant neoplasm of bone: Secondary | ICD-10-CM | POA: Diagnosis not present

## 2022-10-21 DIAGNOSIS — Z515 Encounter for palliative care: Secondary | ICD-10-CM | POA: Diagnosis not present

## 2022-10-21 DIAGNOSIS — Z79811 Long term (current) use of aromatase inhibitors: Secondary | ICD-10-CM | POA: Diagnosis not present

## 2022-10-21 DIAGNOSIS — Z79899 Other long term (current) drug therapy: Secondary | ICD-10-CM | POA: Diagnosis not present

## 2022-10-21 DIAGNOSIS — C50411 Malignant neoplasm of upper-outer quadrant of right female breast: Secondary | ICD-10-CM | POA: Diagnosis not present

## 2022-10-21 DIAGNOSIS — Z170 Estrogen receptor positive status [ER+]: Secondary | ICD-10-CM | POA: Diagnosis not present

## 2022-10-21 DIAGNOSIS — Z510 Encounter for antineoplastic radiation therapy: Secondary | ICD-10-CM | POA: Diagnosis not present

## 2022-10-23 ENCOUNTER — Other Ambulatory Visit: Payer: Self-pay

## 2022-10-23 ENCOUNTER — Ambulatory Visit
Admission: RE | Admit: 2022-10-23 | Discharge: 2022-10-23 | Disposition: A | Discharge status: Home / Self Care | Payer: BC Managed Care – PPO | Source: Ambulatory Visit | Attending: Radiation Oncology | Admitting: Radiation Oncology

## 2022-10-23 ENCOUNTER — Inpatient Hospital Stay (HOSPITAL_BASED_OUTPATIENT_CLINIC_OR_DEPARTMENT_OTHER): Payer: BC Managed Care – PPO | Admitting: Nurse Practitioner

## 2022-10-23 VITALS — BP 126/50 | HR 58 | Temp 98.7°F | Resp 15 | Wt 229.5 lb

## 2022-10-23 DIAGNOSIS — K5900 Constipation, unspecified: Secondary | ICD-10-CM

## 2022-10-23 DIAGNOSIS — Z170 Estrogen receptor positive status [ER+]: Secondary | ICD-10-CM

## 2022-10-23 DIAGNOSIS — C50411 Malignant neoplasm of upper-outer quadrant of right female breast: Secondary | ICD-10-CM

## 2022-10-23 DIAGNOSIS — G893 Neoplasm related pain (acute) (chronic): Secondary | ICD-10-CM | POA: Diagnosis not present

## 2022-10-23 DIAGNOSIS — Z515 Encounter for palliative care: Secondary | ICD-10-CM

## 2022-10-23 DIAGNOSIS — Z510 Encounter for antineoplastic radiation therapy: Secondary | ICD-10-CM | POA: Diagnosis not present

## 2022-10-23 DIAGNOSIS — C7951 Secondary malignant neoplasm of bone: Secondary | ICD-10-CM | POA: Diagnosis not present

## 2022-10-23 DIAGNOSIS — Z79899 Other long term (current) drug therapy: Secondary | ICD-10-CM | POA: Diagnosis not present

## 2022-10-23 DIAGNOSIS — Z7189 Other specified counseling: Secondary | ICD-10-CM

## 2022-10-23 DIAGNOSIS — Z79811 Long term (current) use of aromatase inhibitors: Secondary | ICD-10-CM | POA: Diagnosis not present

## 2022-10-23 DIAGNOSIS — R110 Nausea: Secondary | ICD-10-CM

## 2022-10-23 LAB — RAD ONC ARIA SESSION SUMMARY
Course Elapsed Days: 0
Plan Fractions Treated to Date: 1
Plan Fractions Treated to Date: 1
Plan Fractions Treated to Date: 1
Plan Prescribed Dose Per Fraction: 3 Gy
Plan Prescribed Dose Per Fraction: 3 Gy
Plan Prescribed Dose Per Fraction: 3 Gy
Plan Total Fractions Prescribed: 10
Plan Total Fractions Prescribed: 10
Plan Total Fractions Prescribed: 10
Plan Total Prescribed Dose: 30 Gy
Plan Total Prescribed Dose: 30 Gy
Plan Total Prescribed Dose: 30 Gy
Reference Point Dosage Given to Date: 3 Gy
Reference Point Dosage Given to Date: 3 Gy
Reference Point Dosage Given to Date: 3 Gy
Reference Point Session Dosage Given: 2.8319 Gy
Reference Point Session Dosage Given: 3 Gy
Reference Point Session Dosage Given: 3 Gy
Session Number: 1

## 2022-10-23 MED ORDER — OXYCODONE HCL ER 10 MG PO T12A: 10.0000 mg | EXTENDED_RELEASE_TABLET | Freq: Two times a day (BID) | ORAL | 0 refills | Status: DC

## 2022-10-23 MED ORDER — ONDANSETRON HCL 8 MG PO TABS: 8.0000 mg | ORAL_TABLET | Freq: Three times a day (TID) | ORAL | 3 refills | Status: DC | PRN

## 2022-10-23 MED ORDER — DEXAMETHASONE 2 MG PO TABS: ORAL_TABLET | ORAL | 0 refills | Status: AC

## 2022-10-23 MED ORDER — PROCHLORPERAZINE MALEATE 10 MG PO TABS: 10.0000 mg | ORAL_TABLET | Freq: Every day | ORAL | 2 refills | Status: DC

## 2022-10-23 MED ORDER — OXYCODONE-ACETAMINOPHEN 7.5-325 MG PO TABS: 1.0000 | ORAL_TABLET | Freq: Four times a day (QID) | ORAL | 0 refills | Status: DC | PRN

## 2022-10-23 NOTE — Patient Instructions (Addendum)
-   we will send in for a low-dose steroid called dexamethasone, take it once a day for 14 days, if you have trouble with this let us know and stop taking it - pick up and take percocet (oxycodone and tylenol) every 6 hours for pain - pick up and take long acting oxycodone, take every 12 hours regardless of pain level (9am and 9pm, or 10am and 10pm, etc.) it will slowly release pain medication  into your system over the 12 hours - do not quit your medications cold [place], let us know if you want to stop a medication so you can come off of it safely - take a stool softener or mirilax every other day to prevent constipation  - pick up and take your anti-nausea medications as needed, do not take this medication on an empty stomach - you might feel more drowsy with your first doses, it should go away once your body adjusts to it - you may contine your gabapentin as prescribed and you may take 2 tylenols twice a day as needed for pain - let us know if you develop a rash or itching, stop taking the medication until you call us, if you have shortness of breath call 911

## 2022-10-24 ENCOUNTER — Other Ambulatory Visit: Payer: Self-pay

## 2022-10-24 ENCOUNTER — Ambulatory Visit
Admission: RE | Admit: 2022-10-24 | Discharge: 2022-10-24 | Disposition: A | Discharge status: Home / Self Care | Payer: BC Managed Care – PPO | Source: Ambulatory Visit | Attending: Radiation Oncology | Admitting: Radiation Oncology

## 2022-10-24 ENCOUNTER — Telehealth: Payer: Self-pay | Admitting: Nurse Practitioner

## 2022-10-24 ENCOUNTER — Inpatient Hospital Stay: Payer: BC Managed Care – PPO

## 2022-10-24 ENCOUNTER — Telehealth: Payer: Self-pay | Admitting: Radiation Oncology

## 2022-10-24 ENCOUNTER — Inpatient Hospital Stay (HOSPITAL_BASED_OUTPATIENT_CLINIC_OR_DEPARTMENT_OTHER): Payer: BC Managed Care – PPO | Admitting: Hematology and Oncology

## 2022-10-24 ENCOUNTER — Other Ambulatory Visit: Payer: Self-pay | Admitting: *Deleted

## 2022-10-24 ENCOUNTER — Other Ambulatory Visit: Payer: Self-pay | Admitting: Radiology

## 2022-10-24 VITALS — BP 122/46 | HR 61 | Temp 97.7°F | Resp 16 | Ht 64.0 in | Wt 229.0 lb

## 2022-10-24 DIAGNOSIS — Z515 Encounter for palliative care: Secondary | ICD-10-CM | POA: Diagnosis not present

## 2022-10-24 DIAGNOSIS — Z170 Estrogen receptor positive status [ER+]: Secondary | ICD-10-CM | POA: Diagnosis not present

## 2022-10-24 DIAGNOSIS — C50411 Malignant neoplasm of upper-outer quadrant of right female breast: Secondary | ICD-10-CM

## 2022-10-24 DIAGNOSIS — Z79811 Long term (current) use of aromatase inhibitors: Secondary | ICD-10-CM | POA: Diagnosis not present

## 2022-10-24 DIAGNOSIS — M899 Disorder of bone, unspecified: Secondary | ICD-10-CM

## 2022-10-24 DIAGNOSIS — Z79899 Other long term (current) drug therapy: Secondary | ICD-10-CM | POA: Diagnosis not present

## 2022-10-24 DIAGNOSIS — Z510 Encounter for antineoplastic radiation therapy: Secondary | ICD-10-CM | POA: Diagnosis not present

## 2022-10-24 DIAGNOSIS — C7951 Secondary malignant neoplasm of bone: Secondary | ICD-10-CM | POA: Diagnosis not present

## 2022-10-24 LAB — RAD ONC ARIA SESSION SUMMARY
Course Elapsed Days: 1
Plan Fractions Treated to Date: 2
Plan Fractions Treated to Date: 2
Plan Fractions Treated to Date: 2
Plan Prescribed Dose Per Fraction: 3 Gy
Plan Prescribed Dose Per Fraction: 3 Gy
Plan Prescribed Dose Per Fraction: 3 Gy
Plan Total Fractions Prescribed: 10
Plan Total Fractions Prescribed: 10
Plan Total Fractions Prescribed: 10
Plan Total Prescribed Dose: 30 Gy
Plan Total Prescribed Dose: 30 Gy
Plan Total Prescribed Dose: 30 Gy
Reference Point Dosage Given to Date: 6 Gy
Reference Point Dosage Given to Date: 6 Gy
Reference Point Dosage Given to Date: 6 Gy
Reference Point Session Dosage Given: 3 Gy
Reference Point Session Dosage Given: 3 Gy
Reference Point Session Dosage Given: 3 Gy
Session Number: 2

## 2022-10-24 MED ORDER — FULVESTRANT 250 MG/5ML IM SOSY
500.0000 mg | PREFILLED_SYRINGE | Freq: Once | INTRAMUSCULAR | Status: AC
  Administered 2022-10-24: 500 mg via INTRAMUSCULAR
  Filled 2022-10-24: qty 10

## 2022-10-24 NOTE — Progress Notes (Signed)
[facility]  Telephone:([phone]) [phone] [phone]    ID: Alyscia Carmon Gad DOB: 1951-02-13  MR#: [id number]  JYN#:[id number]  Patient Care Team: [name], FNP as PCP - General (Family Medicine) Jodelle Gross, NP as PCP - Cardiology (Nurse Practitioner) Ovidio Kin, MD as Consulting Physician (General Surgery) Dorothy Puffer, MD as Consulting Physician (Radiation Oncology) Lysle Pearl, MD as Consulting Physician (Pulmonary Disease) Beverely Low, MD as Consulting Physician (Orthopedic Surgery) Jene Every, MD as Consulting Physician (Orthopedic Surgery) Oneta Rack, NP as Nurse Practitioner (Adult Health Nurse Practitioner) Pershing Proud, RN as Oncology Nurse Navigator Donnelly Angelica, RN as Oncology Nurse Navigator Glenna Fellows, MD as Consulting Physician (Plastic Surgery) Rollene Rotunda, MD as Consulting Physician (Cardiology) Rachel Moulds, MD as Medical Oncologist (Hematology and Oncology) Rachel Moulds, MD OTHER MD: Donnie Aho, psychiatry   CHIEF COMPLAINT: Estrogen receptor positive breast cancer (s/p right mastectomy)  CURRENT TREATMENT: anastrozole; ibandronate  INTERVAL HISTORY:  [name] returns today for follow-up of her estrogen receptor positive breast cancer.  She is here for follow up with her husband. She had no issues with injection. She is now undergoing palliative radiation to the left chest wall. Pain is still uncontrolled at this time. She says she got 3 different medication but the pharmacy was out of stock and it will likely be filled tomorrow. She had some nausea, vomiting, yesterday was prescribed anti nausea medication. Rest of the pertinent 10 point ROS reviewed and negative   HISTORY OF CURRENT ILLNESS: From the original intake note:  "[name]" presented to her PCP, Dr. Daphine Deutscher, with right breast pain, fullness, and nipple inversion since November 2019. She proceeded to undergo bilateral  diagnostic mammography with tomography and right breast ultrasonography at [facility] on 07/31/2018 showing: findings compatible with multicentric breast cancer spanning at least the upper inner and upper outer quadrants of the right breast; normal right axilla. Palpable firmness of approximately 1.5 cm was noted in the 9 o'clock position, which was also seen on ultrasound with indisctinct margins measuring 2.7 x 2.3 x 2.2 cm. At 1 o'clock, a mass with poorly defined margins measures 1.9 x 1 x 0.8 cm. Additional poorly defined masses were seen in the 12 o'clock retroareolar region.  Accordingly on 08/02/2018 she proceeded to biopsy of the right breast area in question. The pathology ([id number]) from this procedure showed: invasive mammary carcinoma at 9 o'clock and 1 o'clock; e-cadherin negative, consistent with lobular phenotype; grade 2-3. Prognostic indicators significant for: estrogen receptor, 90% positive and progesterone receptor, 1% positive, both with strong staining intensity. Proliferation marker Ki67 at 1%. HER2 equivocal by immunohistochemistry, 2+ but negative by fluorescent in situ hybridization with a signals ratio 1.04 and number per cell 1.2.   The patient's subsequent history is as detailed below.   PAST MEDICAL HISTORY: Past Medical History:  Diagnosis Date   A-fib 11/2012   PAF   Anxiety    takes Ativan   Asthma 04/07/2011   dx   Bipolar disorder    Cancer    Right breast   Depression    Early cataracts, bilateral    Fatty liver    Fibromyalgia    GERD (gastroesophageal reflux disease)    Irritable bowel syndrome    Mental disorder    dx bipolar   PONV (postoperative nausea and vomiting)    Sleep apnea 04/2011    does not wear CPAP  She has chronic sinus headaches, hiatal hernia   PAST SURGICAL HISTORY: Past Surgical  History:  Procedure Laterality Date   ABDOMINAL HYSTERECTOMY     COLONOSCOPY     DILATION AND CURETTAGE OF UTERUS  1981   abnormal pap    KNEE ARTHROSCOPY Left 2007   x 2   LUMBAR LAMINECTOMY/DECOMPRESSION MICRODISCECTOMY  05/31/2011   Procedure: LUMBAR LAMINECTOMY/DECOMPRESSION MICRODISCECTOMY;  Surgeon: Javier Docker;  Location: [facility];  Service: Orthopedics;  Laterality: Left;  Decompression Lumbar four to five and  Lumbar five to Sacral one on Left  (X-Ray)   MASTECTOMY W/ SENTINEL NODE BIOPSY Right 09/16/2018   Procedure: RIGHT MASTECTOMY WITH RIGHT AXILLARY SENTINEL LYMPH NODE BIOPSY;  Surgeon: Ovidio Kin, MD;  Location: [facility] OR;  Service: General;  Laterality: Right;   PARTIAL HYSTERECTOMY  1982   PORTACATH PLACEMENT Left 10/31/2018   Procedure: INSERTION PORT-A-CATH WITH ULTRASOUND;  Surgeon: Ovidio Kin, MD;  Location: [facility];  Service: General;  Laterality: Left;   TONSILLECTOMY     as child   TOTAL KNEE ARTHROPLASTY Left 12/18/2005    FAMILY HISTORY Family History  Problem Relation Age of Onset   Anesthesia problems Mother    Heart disease Mother        CHF, atrial fib   Heart failure Mother    Anesthesia problems Sister    Colon polyps Father    Heart disease Father        "Fluid around the heart"   Hypertension Sister    Breast cancer Maternal Aunt    Colon cancer Maternal Aunt    Prostate cancer Neg Hx    Ovarian cancer Neg Hx   As of February 2019, patient father is alive at 81 years old. Patient mother died at age 64 in 06/01/2020. She notes a family hx of breast cancer. A maternal aunt was diagnosed with breast cancer, but [name] is unsure if it was in one or both breasts. She has 3 siblings, 3 sisters and 0 brothers.   GYNECOLOGIC HISTORY:  No LMP recorded. Patient is postmenopausal. Menarche: 72 years old Age at first live birth: 71 years old GX P 2 LMP about 42 years ago Contraceptive no HRT no  Hysterectomy? partial So? no   SOCIAL HISTORY:  [name] worked in [employer] for 15 years. She is on disability because her back, knees, and nerves. Her husband, [name], has been  at [employer] 43 years as a Estate agent.  Even though their home is in [place] they are actually living in [place] in an apartment while her husband works there.  Son [name] Needle, age 10, works as a Surveyor, minerals in [place], [place]. Son Onalee Hua, age 55, works as a Psychologist, occupational in [place], [place]. They have 4 grandchildren, 2 great-grandchildren with 2 more on the way. She attends a [employer].   ADVANCED DIRECTIVES: In the absence of any documents to the contrary the patient's husband is her healthcare power of attorney   HEALTH MAINTENANCE: Social History   Tobacco Use   Smoking status: Never   Smokeless tobacco: Never  Vaping Use   Vaping Use: Never used  Substance Use Topics   Alcohol use: No   Drug use: No     Colonoscopy: 2019  PAP: 11/03/2014, normal  Bone density: 10/13/2016, T-score of -2.1   Allergies  Allergen Reactions   Iohexol     "over 20 years ago" had reaction "hurting in arm and heart"-Done in [place], [place].Marland Kitchenphysician stopped CT at that time.   Codeine Other (See Comments)    Makes feel like she  is wild    Palonosetron Other (See Comments)    headache   Prednisone Other (See Comments)    Makes me very irritable   Sulfonamide Derivatives Other (See Comments)    Upset stomach   Celecoxib Nausea And Vomiting   Sulfa Antibiotics Nausea And Vomiting   Vitamin B12 Rash    Current Outpatient Medications  Medication Sig Dispense Refill   anastrozole (ARIMIDEX) 1 MG tablet Take 1 tablet (1 mg total) by mouth daily. 90 tablet 4   apixaban (ELIQUIS) 5 MG TABS tablet Take 1 tablet (5 mg total) by mouth 2 (two) times daily. 180 tablet 1   clotrimazole-betamethasone (LOTRISONE) cream APPLY  CREAM TOPICALLY TWICE DAILY 45 g 0   dexamethasone (DECADRON) 2 MG tablet Take 2 tablets (4 mg total) by mouth daily for 7 days, THEN 1 tablet (2 mg total) daily for 7 days. 21 tablet 0   esomeprazole (NEXIUM) 40 MG capsule Take 1 capsule (40 mg total) by mouth daily. 90 capsule 1   fluticasone  (FLONASE) 50 MCG/ACT nasal spray Place 2 sprays into both nostrils daily. 16 g 6   furosemide (LASIX) 20 MG tablet Take 1 tablet (20 mg total) by mouth daily. (Patient not taking: Reported on 10/11/2022) 90 tablet 1   gabapentin (NEURONTIN) 300 MG capsule Take 1 capsule (300 mg total) by mouth 3 (three) times daily. 90 capsule 5   loratadine (CLARITIN) 10 MG tablet Take 10 mg by mouth daily.     Lurasidone HCl (LATUDA) 60 MG TABS TAKE 1 TABLET BY MOUTH ONCE DAILY 90 tablet 1   metoprolol tartrate (LOPRESSOR) 25 MG tablet Take 1 tablet (25 mg total) by mouth 2 (two) times daily. 180 tablet 1   midodrine (PROAMATINE) 2.5 MG tablet Take 1 tablet (2.5 mg total) by mouth 3 (three) times daily with meals. 270 tablet 3   ondansetron (ZOFRAN) 8 MG tablet Take 1 tablet (8 mg total) by mouth every 8 (eight) hours as needed for nausea. 30 tablet 3   oxyCODONE (OXYCONTIN) 10 mg 12 hr tablet Take 1 tablet (10 mg total) by mouth every 12 (twelve) hours. 30 tablet 0   oxyCODONE-acetaminophen (PERCOCET) 7.5-325 MG tablet Take 1 tablet by mouth every 6 (six) hours as needed for severe pain. 60 tablet 0   pramipexole (MIRAPEX) 1 MG tablet TAKE 1 TABLET BY MOUTH THREE TIMES DAILY 90 tablet 0   prochlorperazine (COMPAZINE) 10 MG tablet Take 1 tablet (10 mg total) by mouth daily. 30 tablet 2   tizanidine (ZANAFLEX) 2 MG capsule Take 2 mg by mouth 3 (three) times daily as needed. (Patient not taking: Reported on 10/11/2022)     triamcinolone (KENALOG) 0.1 % Apply 1 application. topically 2 (two) times daily.     Vilazodone HCl (VIIBRYD) 40 MG TABS Take 1 tablet (40 mg total) by mouth daily. 90 tablet 1   No current facility-administered medications for this visit.   Facility-Administered Medications Ordered in Other Visits  Medication Dose Route Frequency Provider Last Rate Last Admin   heparin lock flush 100 unit/mL  500 Units Intravenous Once Magrinat, Valentino Hue, MD       sodium chloride flush (NS) 0.9 % injection 10 mL   10 mL Intravenous PRN Magrinat, Valentino Hue, MD        OBJECTIVE: white woman who appears stated age  Vitals:   10/24/22 1200  BP: (!) 122/46  Pulse: 61  Resp: 16  Temp: 97.7 F (36.5 C)  SpO2: 98%  Wt Readings from Last 3 Encounters:  10/24/22 229 lb (103.9 kg)  10/23/22 229 lb 8 oz (104.1 kg)  10/17/22 226 lb (102.5 kg)   Body mass index is 39.31 kg/m.    ECOG FS:2 - Symptomatic, <50% confined to bed  Physical Exam Constitutional:      Appearance: Normal appearance.  Chest:  Breasts:    Left: No mass or tenderness.     Comments: Palpable bone tenderness left chest wall below the breast. Musculoskeletal:     Cervical back: No rigidity.  Lymphadenopathy:     Cervical: No cervical adenopathy.  Neurological:     Mental Status: She is alert.       LAB RESULTS:  CMP     Component Value Date/Time   NA 141 09/18/2022 1041   NA 145 (H) 06/05/2022 1024   K 3.6 09/18/2022 1041   CL 105 09/18/2022 1041   CO2 29 09/18/2022 1041   GLUCOSE 119 (H) 09/18/2022 1041   BUN 15 09/18/2022 1041   BUN 17 06/05/2022 1024   CREATININE 0.94 09/18/2022 1041   CREATININE 1.07 (H) 08/25/2022 0921   CALCIUM 9.6 09/18/2022 1041   PROT 7.1 09/18/2022 1041   PROT 6.6 06/05/2022 1024   ALBUMIN 3.8 09/18/2022 1041   ALBUMIN 4.0 06/05/2022 1024   AST 25 09/18/2022 1041   AST 22 08/25/2022 0921   ALT 26 09/18/2022 1041   ALT 26 08/25/2022 0921   ALKPHOS 139 (H) 09/18/2022 1041   BILITOT 0.7 09/18/2022 1041   BILITOT 0.5 08/25/2022 0921   GFRNONAA >60 09/18/2022 1041   GFRNONAA 56 (L) 08/25/2022 0921   GFRAA 81 02/03/2020 1049   GFRAA >60 12/19/2018 1434    No results found for: "TOTALPROTELP", "ALBUMINELP", "A1GS", "A2GS", "BETS", "BETA2SER", "GAMS", "MSPIKE", "SPEI"  No results found for: "KPAFRELGTCHN", "LAMBDASER", "KAPLAMBRATIO"  Lab Results  Component Value Date   WBC 5.9 09/18/2022   NEUTROABS 4.6 09/18/2022   HGB 10.4 (L) 09/18/2022   HCT 31.1 (L) 09/18/2022    MCV 90.1 09/18/2022   PLT 210 09/18/2022   No results found for: "LABCA2"  No components found for: "MWNUUV253"  No results for input(s): "INR" in the last 168 hours.  No results found for: "LABCA2"  No results found for: "CAN199"  No results found for: "CAN125"  Lab Results  Component Value Date   CAN153 656.0 (H) 08/25/2022    Lab Results  Component Value Date   CA2729 2,169.4 (H) 08/25/2022    No components found for: "HGQUANT"  No results found for: "CEA1", "CEA" / No results found for: "CEA1", "CEA"   No results found for: "AFPTUMOR"  No results found for: "CHROMOGRNA"  No results found for: "HGBA", "HGBA2QUANT", "HGBFQUANT", "HGBSQUAN" (Hemoglobinopathy evaluation)   No results found for: "LDH"  No results found for: "IRON", "TIBC", "IRONPCTSAT" (Iron and TIBC)  No results found for: "FERRITIN"  Urinalysis    Component Value Date/Time   COLORURINE YELLOW 12/19/2018 1355   APPEARANCEUR Clear 02/03/2020 1045   LABSPEC 1.016 12/19/2018 1355   PHURINE 5.0 12/19/2018 1355   GLUCOSEU Negative 02/03/2020 1045   HGBUR NEGATIVE 12/19/2018 1355   BILIRUBINUR Negative 02/03/2020 1045   KETONESUR NEGATIVE 12/19/2018 1355   PROTEINUR Negative 02/03/2020 1045   PROTEINUR NEGATIVE 12/19/2018 1355   UROBILINOGEN negative 11/03/2014 1039   UROBILINOGEN 0.2 05/25/2011 0923   NITRITE Negative 02/03/2020 1045   NITRITE NEGATIVE 12/19/2018 1355   LEUKOCYTESUR Negative 02/03/2020 1045   LEUKOCYTESUR SMALL (A) 12/19/2018 1355  STUDIES: NM PET Image Initial (PI) Skull Base To Thigh  Result Date: 10/09/2022 CLINICAL DATA:  Subsequent treatment strategy for breast cancer with possible osseous metastases. EXAM: NUCLEAR MEDICINE PET SKULL BASE TO THIGH TECHNIQUE: 11.6 mCi F-18 FDG was injected intravenously. Full-ring PET imaging was performed from the skull base to thigh after the radiotracer. CT data was obtained and used for attenuation correction and anatomic  localization. Fasting blood glucose: 117 mg/dl COMPARISON:  All body bone scan dated 09/12/2022 FINDINGS: Mediastinal blood pool activity: SUV max 3.1 Liver activity: SUV max NA NECK: No hypermetabolic cervical lymphadenopathy. Incidental CT findings: None. CHEST: Status post right mastectomy. No hypermetabolic thoracic lymphadenopathy. No suspicious pulmonary nodules. Incidental CT findings: None. ABDOMEN/PELVIS: No abnormal hypermetabolism in the liver, spleen, pancreas, or adrenal glands. No hypermetabolic abdominopelvic lymphadenopathy. Incidental CT findings: Large hiatal hernia/inverted intrathoracic stomach. Status post hysterectomy. SKELETON: Widespread hypermetabolism involving the thoracolumbar spine and pelvis, diffuse but heterogeneous, likely reflecting osseous metastases. Focal hypermetabolism involving the left clavicular head, left scapula, left proximal humerus, sternum, right inferior scapula, and multiple bilateral ribs, suggesting additional metastases. Representative hypermetabolism includes: --Medial left clavicle, max SUV 6.2 --Sternum, max SUV 6.9 --T8 vertebral body, max SUV 6.8 --Left lateral 6th rib, max SUV 6.8 --L5 vertebral body, max SUV 8.2 Incidental CT findings: Status post ORIF of the right hip. Degenerative changes of the visualized thoracolumbar spine. IMPRESSION: Status post right mastectomy. Widespread osseous metastases, as above. Electronically Signed   By: Charline Bills M.D.   On: 10/09/2022 02:19      ELIGIBLE FOR AVAILABLE RESEARCH PROTOCOL: no   ASSESSMENT: 72 y.o. [place], [place] woman status post right breast biopsy 08/02/2018 for a clinical T3 N0, stage IIA invasive lobular carcinoma, grade 2, estrogen receptor strongly positive, progesterone receptor 1% positive, with no HER-2 amplification and an MIB-1 of 1%.  (a) CT scan of the head and chest, without contrast 08/23/2018 showed nonspecific 0.4 cm left lower lobe pulmonary nodule, no definitive metastatic  disease  (b) bone scan 08/23/2018 shows multiple spinal areas of abnormal uptake, but  (c) total spinal MRI 09/03/2018 finds bone scan findings to be secondary to degenerative disease, no evidence of metastatic disease.  (1) status post right mastectomy and sentinel lymph node sampling 09/16/2018 showing a pT3 pN1(mic), stage IIA-IIIA invasive lobular breast cancer, grade 2, with 1 of 5 sampled lymph nodes involved by micrometastatic deposit, with extra nodular extension; ample margins  (2)  MammaPrint "high risk" suggests a 5-year metastasis free survival of 93% with chemotherapy, with an absolute chemotherapy benefit in the greater than 12% range  (3) adjuvant radiation 12/31/2018-02/19/2019:  The right chest wall and regional nodes were treated to 50.4 Gy in 28 fractions followed by a 10 Gy boost over 5 fractions.   (4) anastrozole started neoadjuvantly (on 08/14/2018), discontinued 10/14/2018 in preparation for chemotherapy, resumed October 2020  (a) bone density 10/13/2016 showed osteopenia with a lowest score at -2.1  (b) ibandronate/Boniva started January 2021  (c) bone density 11/11/2020 shows a T score of -2.6  (5) [facility] testing on mastectomy sample (09/16/2018) showed stable MSI and proficient mismatch repair status, with a low mutational burden; BRCA 1 and 2 were not mutated, PI K3 was not mutated, ER B B2 was not mutated, and AKT 1 was not mutated.  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  She had IR guided biopsy tomorrow to confirm histology.  In the past she had ER +90% strong  staining PR 1% invasive lobular carcinoma, HER2 negative.  For pain, she is undergoing palliative radiation and is also following up with outpatient palliative care for pain management.  She states the pain is poorly controlled now but she is yet to prescribe her pain medications, apparently her pharmacy is out of stock. She understands that if the pathology confirms metastatic ER positive breast cancer, she may need an additional medication CDK 4 6 inhibitor along with Faslodex.  He will return to clinic in 2 weeks.  Will also plan to send [facility] / foundation 1 testing on the pathology from tomorrow.  Total time spent: 30 minutes  *Total Encounter Time as defined by the Centers for Medicare and Medicaid Services includes, in addition to the face-to-face time of a patient visit (documented in the note above) non-face-to-face time: obtaining and reviewing outside history, ordering and reviewing medications, tests or procedures, care coordination (communications with other health care professionals or caregivers) and documentation in the medical record.

## 2022-10-24 NOTE — Telephone Encounter (Signed)
Pt called asking to move her 4/22 appt to later in the day due to another appt being too close same day. [facility] 4 notified via email and pt advised this would be addressed when she comes in for treatment later today. Pt verbalized understanding.

## 2022-10-24 NOTE — Patient Instructions (Signed)

## 2022-10-25 ENCOUNTER — Other Ambulatory Visit: Payer: Self-pay

## 2022-10-25 ENCOUNTER — Ambulatory Visit (HOSPITAL_COMMUNITY)
Admission: RE | Admit: 2022-10-25 | Discharge: 2022-10-25 | Disposition: A | Discharge status: Home / Self Care | Payer: BC Managed Care – PPO | Source: Ambulatory Visit | Attending: Hematology and Oncology | Admitting: Hematology and Oncology

## 2022-10-25 ENCOUNTER — Encounter (HOSPITAL_COMMUNITY): Payer: Self-pay

## 2022-10-25 ENCOUNTER — Ambulatory Visit
Admission: RE | Admit: 2022-10-25 | Discharge: 2022-10-25 | Disposition: A | Discharge status: Home / Self Care | Payer: BC Managed Care – PPO | Source: Ambulatory Visit | Attending: Radiation Oncology | Admitting: Radiation Oncology

## 2022-10-25 DIAGNOSIS — C7951 Secondary malignant neoplasm of bone: Secondary | ICD-10-CM | POA: Insufficient documentation

## 2022-10-25 DIAGNOSIS — Z79811 Long term (current) use of aromatase inhibitors: Secondary | ICD-10-CM | POA: Diagnosis not present

## 2022-10-25 DIAGNOSIS — C50411 Malignant neoplasm of upper-outer quadrant of right female breast: Secondary | ICD-10-CM | POA: Diagnosis not present

## 2022-10-25 DIAGNOSIS — Z9011 Acquired absence of right breast and nipple: Secondary | ICD-10-CM | POA: Diagnosis not present

## 2022-10-25 DIAGNOSIS — Z170 Estrogen receptor positive status [ER+]: Secondary | ICD-10-CM | POA: Diagnosis not present

## 2022-10-25 DIAGNOSIS — C50919 Malignant neoplasm of unspecified site of unspecified female breast: Secondary | ICD-10-CM | POA: Diagnosis not present

## 2022-10-25 DIAGNOSIS — Z515 Encounter for palliative care: Secondary | ICD-10-CM | POA: Diagnosis not present

## 2022-10-25 DIAGNOSIS — C50911 Malignant neoplasm of unspecified site of right female breast: Secondary | ICD-10-CM | POA: Diagnosis not present

## 2022-10-25 DIAGNOSIS — Z510 Encounter for antineoplastic radiation therapy: Secondary | ICD-10-CM | POA: Diagnosis not present

## 2022-10-25 DIAGNOSIS — M899 Disorder of bone, unspecified: Secondary | ICD-10-CM

## 2022-10-25 DIAGNOSIS — C801 Malignant (primary) neoplasm, unspecified: Secondary | ICD-10-CM | POA: Diagnosis not present

## 2022-10-25 DIAGNOSIS — Z79899 Other long term (current) drug therapy: Secondary | ICD-10-CM | POA: Diagnosis not present

## 2022-10-25 LAB — RAD ONC ARIA SESSION SUMMARY
Course Elapsed Days: 2
Plan Fractions Treated to Date: 3
Plan Fractions Treated to Date: 3
Plan Fractions Treated to Date: 3
Plan Prescribed Dose Per Fraction: 3 Gy
Plan Prescribed Dose Per Fraction: 3 Gy
Plan Prescribed Dose Per Fraction: 3 Gy
Plan Total Fractions Prescribed: 10
Plan Total Fractions Prescribed: 10
Plan Total Fractions Prescribed: 10
Plan Total Prescribed Dose: 30 Gy
Plan Total Prescribed Dose: 30 Gy
Plan Total Prescribed Dose: 30 Gy
Reference Point Dosage Given to Date: 9 Gy
Reference Point Dosage Given to Date: 9 Gy
Reference Point Dosage Given to Date: 9 Gy
Reference Point Session Dosage Given: 3 Gy
Reference Point Session Dosage Given: 3 Gy
Reference Point Session Dosage Given: 3 Gy
Session Number: 3

## 2022-10-25 LAB — CBC
HCT: 26.3 % — ABNORMAL LOW (ref 36.0–46.0)
Hemoglobin: 8.5 g/dL — ABNORMAL LOW (ref 12.0–15.0)
MCH: 29.9 pg (ref 26.0–34.0)
MCHC: 32.3 g/dL (ref 30.0–36.0)
MCV: 92.6 fL (ref 80.0–100.0)
Platelets: 170 10*3/uL (ref 150–400)
RBC: 2.84 MIL/uL — ABNORMAL LOW (ref 3.87–5.11)
RDW: 15.8 % — ABNORMAL HIGH (ref 11.5–15.5)
WBC: 4 10*3/uL (ref 4.0–10.5)
nRBC: 0.7 % — ABNORMAL HIGH (ref 0.0–0.2)

## 2022-10-25 LAB — PROTIME-INR
INR: 1.3 — ABNORMAL HIGH (ref 0.8–1.2)
Prothrombin Time: 15.9 seconds — ABNORMAL HIGH (ref 11.4–15.2)

## 2022-10-25 MED ORDER — SODIUM CHLORIDE 0.9 % IV SOLN: INTRAVENOUS | Status: DC

## 2022-10-25 MED ORDER — FENTANYL CITRATE (PF) 100 MCG/2ML IJ SOLN
INTRAMUSCULAR | Status: AC | PRN
  Administered 2022-10-25 (×2): 25 ug via INTRAVENOUS
  Administered 2022-10-25: 50 ug via INTRAVENOUS

## 2022-10-25 MED ORDER — FENTANYL CITRATE (PF) 100 MCG/2ML IJ SOLN
INTRAMUSCULAR | Status: AC
  Filled 2022-10-25: qty 2

## 2022-10-25 MED ORDER — MIDAZOLAM HCL 2 MG/2ML IJ SOLN
INTRAMUSCULAR | Status: AC
  Filled 2022-10-25: qty 2

## 2022-10-25 MED ORDER — MIDAZOLAM HCL 2 MG/2ML IJ SOLN
INTRAMUSCULAR | Status: AC | PRN
  Administered 2022-10-25: 1 mg via INTRAVENOUS
  Administered 2022-10-25: .5 mg via INTRAVENOUS
  Administered 2022-10-25: 1 mg via INTRAVENOUS

## 2022-10-25 NOTE — H&P (Signed)
Chief Complaint: Patient was seen in consultation today for iliac bone biopsy at the request of Iruku,Praveena  Referring Physician(s): Iruku,Praveena  Supervising Physician: Simonne Come  Patient Status: [facility] - Out-pt  History of Present Illness: Doris Smith is a 72 y.o. female   Known Hx Breast Cancer 2019 Recent increasing body aches and pains---especially Rt hip and left back and left shoulder  Dr Reynold Bowen orders PET scan 10/06/22: SKELETON: Widespread hypermetabolism involving the thoracolumbar spine and pelvis, diffuse but heterogeneous, likely reflecting osseous metastases. Focal hypermetabolism involving the left clavicular head, left scapula, left proximal humerus, sternum, right inferior scapula, and multiple bilateral ribs, suggesting additional metastases. Representative hypermetabolism includes: --Medial left clavicle, max SUV 6.2 --Sternum, max SUV 6.9 --T8 vertebral body, max SUV 6.8 --Left lateral 6th rib, max SUV 6.8 --L5 vertebral body, max SUV 8.2 Incidental CT findings: Status post ORIF of the right hip. Degenerative changes of the visualized thoracolumbar spine. IMPRESSION: Status post right mastectomy. Widespread osseous metastases  Request for biopsy Imaging reviewed and iliac bone lesion is scheduled for biopsy today Last dose Eliquis 10/22/22  Past Medical History:  Diagnosis Date   A-fib 11/2012   PAF   Anxiety    takes Ativan   Asthma 04/07/2011   dx   Bipolar disorder    Cancer    Right breast   Depression    Early cataracts, bilateral    Fatty liver    Fibromyalgia    GERD (gastroesophageal reflux disease)    Irritable bowel syndrome    Mental disorder    dx bipolar   PONV (postoperative nausea and vomiting)    Sleep apnea 04/2011    does not wear CPAP    Past Surgical History:  Procedure Laterality Date   ABDOMINAL HYSTERECTOMY     COLONOSCOPY     DILATION AND CURETTAGE OF UTERUS  1981   abnormal pap   KNEE  ARTHROSCOPY Left 2007   x 2   LUMBAR LAMINECTOMY/DECOMPRESSION MICRODISCECTOMY  05/31/2011   Procedure: LUMBAR LAMINECTOMY/DECOMPRESSION MICRODISCECTOMY;  Surgeon: Javier Docker;  Location: [facility];  Service: Orthopedics;  Laterality: Left;  Decompression Lumbar four to five and  Lumbar five to Sacral one on Left  (X-Ray)   MASTECTOMY W/ SENTINEL NODE BIOPSY Right 09/16/2018   Procedure: RIGHT MASTECTOMY WITH RIGHT AXILLARY SENTINEL LYMPH NODE BIOPSY;  Surgeon: Ovidio Kin, MD;  Location: [facility] OR;  Service: General;  Laterality: Right;   PARTIAL HYSTERECTOMY  1982   PORTACATH PLACEMENT Left 10/31/2018   Procedure: INSERTION PORT-A-CATH WITH ULTRASOUND;  Surgeon: Ovidio Kin, MD;  Location: [facility];  Service: General;  Laterality: Left;   TONSILLECTOMY     as child   TOTAL KNEE ARTHROPLASTY Left 12/18/2005    Allergies: Iohexol, Codeine, Palonosetron, Prednisone, Sulfonamide derivatives, Celecoxib, Sulfa antibiotics, and Vitamin b12  Medications: Prior to Admission medications   Medication Sig Start Date End Date Taking? Authorizing Provider  anastrozole (ARIMIDEX) 1 MG tablet Take 1 tablet (1 mg total) by mouth daily. 06/05/22  Yes Daphine Deutscher, [name], FNP  apixaban (ELIQUIS) 5 MG TABS tablet Take 1 tablet (5 mg total) by mouth 2 (two) times daily. 06/05/22  Yes Martin, [name], FNP  esomeprazole (NEXIUM) 40 MG capsule Take 1 capsule (40 mg total) by mouth daily. 06/05/22  Yes Martin, [name], FNP  gabapentin (NEURONTIN) 300 MG capsule Take 1 capsule (300 mg total) by mouth 3 (three) times daily. 06/05/22  Yes Daphine Deutscher, [name], FNP  loratadine (CLARITIN) 10 MG tablet  Take 10 mg by mouth daily.   Yes [provider]  Lurasidone HCl (LATUDA) 60 MG TABS TAKE 1 TABLET BY MOUTH ONCE DAILY 06/05/22  Yes Daphine Deutscher, [name], FNP  metoprolol tartrate (LOPRESSOR) 25 MG tablet Take 1 tablet (25 mg total) by mouth 2 (two) times daily. 06/05/22  Yes  Martin, [name], FNP  midodrine (PROAMATINE) 2.5 MG tablet Take 1 tablet (2.5 mg total) by mouth 3 (three) times daily with meals. 12/30/21  Yes Rollene Rotunda, MD  pramipexole (MIRAPEX) 1 MG tablet TAKE 1 TABLET BY MOUTH THREE TIMES DAILY 10/13/22  Yes Daphine Deutscher, [name], FNP  Vilazodone HCl (VIIBRYD) 40 MG TABS Take 1 tablet (40 mg total) by mouth daily. 06/05/22  Yes Daphine Deutscher, [name], FNP  clotrimazole-betamethasone (LOTRISONE) cream APPLY  CREAM TOPICALLY TWICE DAILY 10/02/19   Daphine Deutscher, [name], FNP  dexamethasone (DECADRON) 2 MG tablet Take 2 tablets (4 mg total) by mouth daily for 7 days, THEN 1 tablet (2 mg total) daily for 7 days. 10/23/22 11/06/22  Pickenpack-Cousar, Arty Baumgartner, NP  fluticasone (FLONASE) 50 MCG/ACT nasal spray Place 2 sprays into both nostrils daily. 05/30/21   Daphine Deutscher, [name], FNP  furosemide (LASIX) 20 MG tablet Take 1 tablet (20 mg total) by mouth daily. Patient not taking: Reported on 10/11/2022 06/05/22   [name], FNP  ondansetron (ZOFRAN) 8 MG tablet Take 1 tablet (8 mg total) by mouth every 8 (eight) hours as needed for nausea. 10/23/22   Pickenpack-Cousar, Arty Baumgartner, NP  oxyCODONE (OXYCONTIN) 10 mg 12 hr tablet Take 1 tablet (10 mg total) by mouth every 12 (twelve) hours. 10/23/22   Pickenpack-Cousar, Arty Baumgartner, NP  oxyCODONE-acetaminophen (PERCOCET) 7.5-325 MG tablet Take 1 tablet by mouth every 6 (six) hours as needed for severe pain. 10/23/22   Pickenpack-Cousar, Arty Baumgartner, NP  prochlorperazine (COMPAZINE) 10 MG tablet Take 1 tablet (10 mg total) by mouth daily. 10/23/22   Pickenpack-Cousar, Arty Baumgartner, NP  tizanidine (ZANAFLEX) 2 MG capsule Take 2 mg by mouth 3 (three) times daily as needed. Patient not taking: Reported on 10/11/2022 11/26/21   [provider]  triamcinolone (KENALOG) 0.1 % Apply 1 application. topically 2 (two) times daily. 06/21/20   [provider]     Family History  Problem Relation Age of Onset    Anesthesia problems Mother    Heart disease Mother        CHF, atrial fib   Heart failure Mother    Anesthesia problems Sister    Colon polyps Father    Heart disease Father        "Fluid around the heart"   Hypertension Sister    Breast cancer Maternal Aunt    Colon cancer Maternal Aunt    Prostate cancer Neg Hx    Ovarian cancer Neg Hx     Social History   Socioeconomic History   Marital status: Married    Spouse name: [name]   Number of children: 2   Years of education: 12   Highest education level: Not on file  Occupational History   Occupation: Disabled  Tobacco Use   Smoking status: Never   Smokeless tobacco: Never  Vaping Use   Vaping Use: Never used  Substance and Sexual Activity   Alcohol use: No   Drug use: No   Sexual activity: Yes    Birth control/protection: Post-menopausal, Surgical  Other Topics Concern   Not on file  Social History Narrative   Tea daily.  Rarely has caffeine    Social Determinants  of Health   Financial Resource Strain: Low Risk  (08/18/2022)   Overall Financial Resource Strain (CARDIA)    Difficulty of Paying Living Expenses: Not hard at all  Food Insecurity: Unknown (08/18/2022)   Hunger Vital Sign    Worried About Running Out of Food in the Last Year: Patient declined    Ran Out of Food in the Last Year: Never true  Transportation Needs: No Transportation Needs (08/18/2022)   PRAPARE - Administrator, Civil Service (Medical): No    Lack of Transportation (Non-Medical): No  Physical Activity: Inactive (08/18/2022)   Exercise Vital Sign    Days of Exercise per Week: 0 days    Minutes of Exercise per Session: 0 min  Stress: No Stress Concern Present (08/18/2022)   [employer] of Occupational Health - Occupational Stress Questionnaire    Feeling of Stress : Not at all  Social Connections: Moderately Integrated (08/18/2022)   Social Connection and Isolation Panel [NHANES]    Frequency of Communication with Friends and  Family: More than three times a week    Frequency of Social Gatherings with Friends and Family: More than three times a week    Attends Religious Services: More than 4 times per year    Active Member of [employer] or Organizations: No    Attends Banker Meetings: Never    Marital Status: Married    Review of Systems: A 12 point ROS discussed and pertinent positives are indicated in the HPI above.  All other systems are negative.  Review of Systems  Constitutional:  Positive for activity change. Negative for fatigue and fever.  Respiratory:  Negative for cough and shortness of breath.   Cardiovascular:  Negative for chest pain.  Gastrointestinal:  Negative for abdominal pain and nausea.  Musculoskeletal:  Positive for back pain and gait problem.  Neurological:  Positive for weakness.  Psychiatric/Behavioral:  Negative for behavioral problems and confusion.     Vital Signs: BP (!) 112/47   Temp (!) 97.3 F (36.3 C) (Temporal)   Resp 19   Ht  (1.626 m)   Wt 223 lb (101.2 kg)   BMI 38.28 kg/m     Physical Exam Vitals reviewed.  HENT:     Mouth/Throat:     Mouth: Mucous membranes are moist.  Cardiovascular:     Rate and Rhythm: Normal rate and regular rhythm.     Heart sounds: Normal heart sounds.  Pulmonary:     Effort: Pulmonary effort is normal.     Breath sounds: Normal breath sounds.  Abdominal:     Palpations: Abdomen is soft.  Musculoskeletal:        General: Normal range of motion.  Skin:    General: Skin is warm.  Neurological:     Mental Status: She is alert and oriented to person, place, and time.  Psychiatric:        Behavior: Behavior normal.     Imaging: NM PET Image Initial (PI) Skull Base To Thigh  Result Date: 10/09/2022 CLINICAL DATA:  Subsequent treatment strategy for breast cancer with possible osseous metastases. EXAM: NUCLEAR MEDICINE PET SKULL BASE TO THIGH TECHNIQUE: 11.6 mCi F-18 FDG was injected intravenously. Full-ring PET  imaging was performed from the skull base to thigh after the radiotracer. CT data was obtained and used for attenuation correction and anatomic localization. Fasting blood glucose: 117 mg/dl COMPARISON:  All body bone scan dated 09/12/2022 FINDINGS: Mediastinal blood pool activity: SUV max 3.1 Liver  activity: SUV max NA NECK: No hypermetabolic cervical lymphadenopathy. Incidental CT findings: None. CHEST: Status post right mastectomy. No hypermetabolic thoracic lymphadenopathy. No suspicious pulmonary nodules. Incidental CT findings: None. ABDOMEN/PELVIS: No abnormal hypermetabolism in the liver, spleen, pancreas, or adrenal glands. No hypermetabolic abdominopelvic lymphadenopathy. Incidental CT findings: Large hiatal hernia/inverted intrathoracic stomach. Status post hysterectomy. SKELETON: Widespread hypermetabolism involving the thoracolumbar spine and pelvis, diffuse but heterogeneous, likely reflecting osseous metastases. Focal hypermetabolism involving the left clavicular head, left scapula, left proximal humerus, sternum, right inferior scapula, and multiple bilateral ribs, suggesting additional metastases. Representative hypermetabolism includes: --Medial left clavicle, max SUV 6.2 --Sternum, max SUV 6.9 --T8 vertebral body, max SUV 6.8 --Left lateral 6th rib, max SUV 6.8 --L5 vertebral body, max SUV 8.2 Incidental CT findings: Status post ORIF of the right hip. Degenerative changes of the visualized thoracolumbar spine. IMPRESSION: Status post right mastectomy. Widespread osseous metastases, as above. Electronically Signed   By: Charline Bills M.D.   On: 10/09/2022 02:19    Labs:  CBC: Recent Labs    05/15/22 0851 06/05/22 1024 08/25/22 0921 09/18/22 1041  WBC 6.3 6.9 6.0 5.9  HGB 11.2* 12.0 10.8* 10.4*  HCT 35.0* 37.4 32.5* 31.1*  PLT 222 251 188 210    COAGS: No results for input(s): "INR", "APTT" in the last 8760 hours.  BMP: Recent Labs    05/15/22 0851 06/05/22 1024  08/25/22 0921 09/18/22 1041  NA 141 145* 142 141  K 4.3 4.3 3.5 3.6  CL 106 104 106 105  CO2 29 23 30 29   GLUCOSE 147* 105* 115* 119*  BUN 13 17 14 15   CALCIUM 9.1 9.9 9.4 9.6  CREATININE 1.07* 1.15* 1.07* 0.94  GFRNONAA 56*  --  56* >60    LIVER FUNCTION TESTS: Recent Labs    05/15/22 0851 06/05/22 1024 08/25/22 0921 09/18/22 1041  BILITOT 0.6 0.5 0.5 0.7  AST 19 24 22 25   ALT 17 18 26 26   ALKPHOS 107 135* 126 139*  PROT 6.7 6.6 6.5 7.1  ALBUMIN 3.7 4.0 3.6 3.8    TUMOR MARKERS: No results for input(s): "AFPTM", "CEA", "CA199", "CHROMGRNA" in the last 8760 hours.  Assessment and Plan:  Hx Breast Cancer Bony mets per recent PET Scheduled now for iliac bone lesion biopsy Risks and benefits of iliac bone lesion biopsy was discussed with the patient and/or patient's family including, but not limited to bleeding, infection, damage to adjacent structures or low yield requiring additional tests.  All of the questions were answered and there is agreement to proceed. Consent signed and in chart.  Thank you for this interesting consult.  I greatly enjoyed meeting Elvin Banker Coghill and look forward to participating in their care.  A copy of this report was sent to the requesting provider on this date.  Electronically Signed: Robet Leu, PA-C 10/25/2022, 8:51 AM   I spent a total of  30 Minutes   in face to face in clinical consultation, greater than 50% of which was counseling/coordinating care for iliac bone lesion biopsy

## 2022-10-25 NOTE — Procedures (Signed)
Interventional Radiology Procedure Note  Procedure: CT CORE BX RT ILIAC BONE    Complications: None  Estimated Blood Loss:  MIN  Findings: 11 G CORE RT ILIAC BONE INFILTRATIVE MALIGNANT MET DZ    Sharen Counter, MD

## 2022-10-26 ENCOUNTER — Other Ambulatory Visit: Payer: Self-pay

## 2022-10-26 ENCOUNTER — Ambulatory Visit
Admission: RE | Admit: 2022-10-26 | Discharge: 2022-10-26 | Disposition: A | Discharge status: Home / Self Care | Payer: BC Managed Care – PPO | Source: Ambulatory Visit | Attending: Radiation Oncology | Admitting: Radiation Oncology

## 2022-10-26 DIAGNOSIS — Z79899 Other long term (current) drug therapy: Secondary | ICD-10-CM | POA: Diagnosis not present

## 2022-10-26 DIAGNOSIS — C50411 Malignant neoplasm of upper-outer quadrant of right female breast: Secondary | ICD-10-CM | POA: Diagnosis not present

## 2022-10-26 DIAGNOSIS — Z79811 Long term (current) use of aromatase inhibitors: Secondary | ICD-10-CM | POA: Diagnosis not present

## 2022-10-26 DIAGNOSIS — C7951 Secondary malignant neoplasm of bone: Secondary | ICD-10-CM | POA: Diagnosis not present

## 2022-10-26 DIAGNOSIS — Z510 Encounter for antineoplastic radiation therapy: Secondary | ICD-10-CM | POA: Diagnosis not present

## 2022-10-26 DIAGNOSIS — Z515 Encounter for palliative care: Secondary | ICD-10-CM | POA: Diagnosis not present

## 2022-10-26 DIAGNOSIS — Z170 Estrogen receptor positive status [ER+]: Secondary | ICD-10-CM | POA: Diagnosis not present

## 2022-10-26 LAB — RAD ONC ARIA SESSION SUMMARY
Course Elapsed Days: 3
Plan Fractions Treated to Date: 4
Plan Fractions Treated to Date: 4
Plan Fractions Treated to Date: 4
Plan Prescribed Dose Per Fraction: 3 Gy
Plan Prescribed Dose Per Fraction: 3 Gy
Plan Prescribed Dose Per Fraction: 3 Gy
Plan Total Fractions Prescribed: 10
Plan Total Fractions Prescribed: 10
Plan Total Fractions Prescribed: 10
Plan Total Prescribed Dose: 30 Gy
Plan Total Prescribed Dose: 30 Gy
Plan Total Prescribed Dose: 30 Gy
Reference Point Dosage Given to Date: 12 Gy
Reference Point Dosage Given to Date: 12 Gy
Reference Point Dosage Given to Date: 12 Gy
Reference Point Session Dosage Given: 3 Gy
Reference Point Session Dosage Given: 3 Gy
Reference Point Session Dosage Given: 3 Gy
Session Number: 4

## 2022-10-27 ENCOUNTER — Ambulatory Visit
Admission: RE | Admit: 2022-10-27 | Discharge: 2022-10-27 | Disposition: A | Discharge status: Home / Self Care | Payer: BC Managed Care – PPO | Source: Ambulatory Visit | Attending: Radiation Oncology | Admitting: Radiation Oncology

## 2022-10-27 ENCOUNTER — Other Ambulatory Visit: Payer: Self-pay

## 2022-10-27 DIAGNOSIS — C50411 Malignant neoplasm of upper-outer quadrant of right female breast: Secondary | ICD-10-CM | POA: Diagnosis not present

## 2022-10-27 DIAGNOSIS — Z79811 Long term (current) use of aromatase inhibitors: Secondary | ICD-10-CM | POA: Diagnosis not present

## 2022-10-27 DIAGNOSIS — Z510 Encounter for antineoplastic radiation therapy: Secondary | ICD-10-CM | POA: Diagnosis not present

## 2022-10-27 DIAGNOSIS — C7951 Secondary malignant neoplasm of bone: Secondary | ICD-10-CM | POA: Diagnosis not present

## 2022-10-27 DIAGNOSIS — Z79899 Other long term (current) drug therapy: Secondary | ICD-10-CM | POA: Diagnosis not present

## 2022-10-27 DIAGNOSIS — Z515 Encounter for palliative care: Secondary | ICD-10-CM | POA: Diagnosis not present

## 2022-10-27 DIAGNOSIS — Z170 Estrogen receptor positive status [ER+]: Secondary | ICD-10-CM | POA: Diagnosis not present

## 2022-10-27 LAB — RAD ONC ARIA SESSION SUMMARY
Course Elapsed Days: 4
Plan Fractions Treated to Date: 5
Plan Fractions Treated to Date: 5
Plan Fractions Treated to Date: 5
Plan Prescribed Dose Per Fraction: 3 Gy
Plan Prescribed Dose Per Fraction: 3 Gy
Plan Prescribed Dose Per Fraction: 3 Gy
Plan Total Fractions Prescribed: 10
Plan Total Fractions Prescribed: 10
Plan Total Fractions Prescribed: 10
Plan Total Prescribed Dose: 30 Gy
Plan Total Prescribed Dose: 30 Gy
Plan Total Prescribed Dose: 30 Gy
Reference Point Dosage Given to Date: 15 Gy
Reference Point Dosage Given to Date: 15 Gy
Reference Point Dosage Given to Date: 15 Gy
Reference Point Session Dosage Given: 3 Gy
Reference Point Session Dosage Given: 3 Gy
Reference Point Session Dosage Given: 3 Gy
Session Number: 5

## 2022-10-30 ENCOUNTER — Other Ambulatory Visit: Payer: Self-pay

## 2022-10-30 ENCOUNTER — Ambulatory Visit
Admission: RE | Admit: 2022-10-30 | Discharge: 2022-10-30 | Disposition: A | Discharge status: Home / Self Care | Payer: BC Managed Care – PPO | Source: Ambulatory Visit | Attending: Radiation Oncology | Admitting: Radiation Oncology

## 2022-10-30 DIAGNOSIS — Z515 Encounter for palliative care: Secondary | ICD-10-CM | POA: Diagnosis not present

## 2022-10-30 DIAGNOSIS — Z170 Estrogen receptor positive status [ER+]: Secondary | ICD-10-CM | POA: Diagnosis not present

## 2022-10-30 DIAGNOSIS — C50411 Malignant neoplasm of upper-outer quadrant of right female breast: Secondary | ICD-10-CM | POA: Diagnosis not present

## 2022-10-30 DIAGNOSIS — Z79899 Other long term (current) drug therapy: Secondary | ICD-10-CM | POA: Diagnosis not present

## 2022-10-30 DIAGNOSIS — C7951 Secondary malignant neoplasm of bone: Secondary | ICD-10-CM | POA: Diagnosis not present

## 2022-10-30 DIAGNOSIS — S72001A Fracture of unspecified part of neck of right femur, initial encounter for closed fracture: Secondary | ICD-10-CM | POA: Diagnosis not present

## 2022-10-30 DIAGNOSIS — Z79811 Long term (current) use of aromatase inhibitors: Secondary | ICD-10-CM | POA: Diagnosis not present

## 2022-10-30 DIAGNOSIS — Z510 Encounter for antineoplastic radiation therapy: Secondary | ICD-10-CM | POA: Diagnosis not present

## 2022-10-30 LAB — RAD ONC ARIA SESSION SUMMARY
Course Elapsed Days: 7
Plan Fractions Treated to Date: 6
Plan Fractions Treated to Date: 6
Plan Fractions Treated to Date: 6
Plan Prescribed Dose Per Fraction: 3 Gy
Plan Prescribed Dose Per Fraction: 3 Gy
Plan Prescribed Dose Per Fraction: 3 Gy
Plan Total Fractions Prescribed: 10
Plan Total Fractions Prescribed: 10
Plan Total Fractions Prescribed: 10
Plan Total Prescribed Dose: 30 Gy
Plan Total Prescribed Dose: 30 Gy
Plan Total Prescribed Dose: 30 Gy
Reference Point Dosage Given to Date: 18 Gy
Reference Point Dosage Given to Date: 18 Gy
Reference Point Dosage Given to Date: 18 Gy
Reference Point Session Dosage Given: 3 Gy
Reference Point Session Dosage Given: 3 Gy
Reference Point Session Dosage Given: 3 Gy
Session Number: 6

## 2022-10-31 ENCOUNTER — Inpatient Hospital Stay: Payer: BC Managed Care – PPO | Admitting: Nurse Practitioner

## 2022-10-31 ENCOUNTER — Other Ambulatory Visit: Payer: Self-pay

## 2022-10-31 ENCOUNTER — Ambulatory Visit
Admission: RE | Admit: 2022-10-31 | Discharge: 2022-10-31 | Disposition: A | Discharge status: Home / Self Care | Payer: BC Managed Care – PPO | Source: Ambulatory Visit | Attending: Radiation Oncology | Admitting: Radiation Oncology

## 2022-10-31 ENCOUNTER — Inpatient Hospital Stay (HOSPITAL_BASED_OUTPATIENT_CLINIC_OR_DEPARTMENT_OTHER): Payer: BC Managed Care – PPO | Admitting: Nurse Practitioner

## 2022-10-31 ENCOUNTER — Encounter: Payer: Self-pay | Admitting: Nurse Practitioner

## 2022-10-31 VITALS — BP 109/63 | HR 72 | Temp 98.1°F | Resp 13 | Wt 231.9 lb

## 2022-10-31 DIAGNOSIS — Z170 Estrogen receptor positive status [ER+]: Secondary | ICD-10-CM

## 2022-10-31 DIAGNOSIS — Z515 Encounter for palliative care: Secondary | ICD-10-CM

## 2022-10-31 DIAGNOSIS — C50411 Malignant neoplasm of upper-outer quadrant of right female breast: Secondary | ICD-10-CM

## 2022-10-31 DIAGNOSIS — G893 Neoplasm related pain (acute) (chronic): Secondary | ICD-10-CM | POA: Diagnosis not present

## 2022-10-31 DIAGNOSIS — C7951 Secondary malignant neoplasm of bone: Secondary | ICD-10-CM | POA: Diagnosis not present

## 2022-10-31 DIAGNOSIS — Z510 Encounter for antineoplastic radiation therapy: Secondary | ICD-10-CM | POA: Diagnosis not present

## 2022-10-31 DIAGNOSIS — K5903 Drug induced constipation: Secondary | ICD-10-CM

## 2022-10-31 DIAGNOSIS — Z79811 Long term (current) use of aromatase inhibitors: Secondary | ICD-10-CM | POA: Diagnosis not present

## 2022-10-31 DIAGNOSIS — Z79899 Other long term (current) drug therapy: Secondary | ICD-10-CM | POA: Diagnosis not present

## 2022-10-31 LAB — RAD ONC ARIA SESSION SUMMARY
Course Elapsed Days: 8
Plan Fractions Treated to Date: 7
Plan Fractions Treated to Date: 7
Plan Fractions Treated to Date: 7
Plan Prescribed Dose Per Fraction: 3 Gy
Plan Prescribed Dose Per Fraction: 3 Gy
Plan Prescribed Dose Per Fraction: 3 Gy
Plan Total Fractions Prescribed: 10
Plan Total Fractions Prescribed: 10
Plan Total Fractions Prescribed: 10
Plan Total Prescribed Dose: 30 Gy
Plan Total Prescribed Dose: 30 Gy
Plan Total Prescribed Dose: 30 Gy
Reference Point Dosage Given to Date: 21 Gy
Reference Point Dosage Given to Date: 21 Gy
Reference Point Dosage Given to Date: 21 Gy
Reference Point Session Dosage Given: 3 Gy
Reference Point Session Dosage Given: 3 Gy
Reference Point Session Dosage Given: 3 Gy
Session Number: 7

## 2022-10-31 MED ORDER — KETOROLAC TROMETHAMINE 15 MG/ML IJ SOLN: 30.0000 mg | Freq: Once | INTRAMUSCULAR | Status: DC

## 2022-10-31 MED ORDER — KETOROLAC TROMETHAMINE 30 MG/ML IJ SOLN
30.0000 mg | Freq: Once | INTRAMUSCULAR | Status: AC
  Administered 2022-10-31: 30 mg via INTRAMUSCULAR
  Filled 2022-10-31: qty 1

## 2022-10-31 NOTE — Progress Notes (Signed)
Pt given IM Toradol per [name], NP, tolerated well and was d/c'ed in stable condition to lobby to be seen by rad-onc

## 2022-10-31 NOTE — Progress Notes (Signed)
Palliative Medicine [facility]  [phone] [phone]   Name: Doris Smith Date: 10/31/2022 MRN: [id number]  DOB: 14-Sep-1950  Patient Care Team: [name], FNP as PCP - General (Family Medicine) Jodelle Gross, NP as PCP - Cardiology (Nurse Practitioner) Ovidio Kin, MD as Consulting Physician (General Surgery) Dorothy Puffer, MD as Consulting Physician (Radiation Oncology) Lysle Pearl, MD as Consulting Physician (Pulmonary Disease) Beverely Low, MD as Consulting Physician (Orthopedic Surgery) Jene Every, MD as Consulting Physician (Orthopedic Surgery) Oneta Rack, NP as Nurse Practitioner (Adult Health Nurse Practitioner) Pershing Proud, RN as Oncology Nurse Navigator Donnelly Angelica, RN as Oncology Nurse Navigator Glenna Fellows, MD as Consulting Physician (Plastic Surgery) Rollene Rotunda, MD as Consulting Physician (Cardiology) Rachel Moulds, MD as Medical Oncologist (Hematology and Oncology)    INTERVAL HISTORY: Doris Smith is a 72 y.o. female with oncologic medical history including estrogen receptor positive breast cancer(07/2018) s/p right mastectomy. PET scan 10/06/22 showing widespread osseous metastasis. Other pertinent history includes atrial fibrillation, asthma, OSA, GERD, fatty liver, fibromyalgia, osteoporosis, chronic headaches, anxiety, depression, and bipolar disorder. Underwent ORIF of right hip 09/20/2022. Palliative ask to see for symptom and pain management and goals of care.   SOCIAL HISTORY:    [name] reports that she has never smoked. She has never used smokeless tobacco. She reports that she does not drink alcohol and does not use drugs.  ADVANCE DIRECTIVES:  Advanced directives on file, Doris Smith, spouse, was identified as Management consultant should patient be unable to make decision for herself   CODE STATUS: Full code  PAST MEDICAL HISTORY: Past Medical  History:  Diagnosis Date   A-fib 11/2012   PAF   Anxiety    takes Ativan   Asthma 04/07/2011   dx   Bipolar disorder    Cancer    Right breast   Depression    Early cataracts, bilateral    Fatty liver    Fibromyalgia    GERD (gastroesophageal reflux disease)    Irritable bowel syndrome    Mental disorder    dx bipolar   PONV (postoperative nausea and vomiting)    Sleep apnea 04/2011    does not wear CPAP    ALLERGIES:  is allergic to iohexol, codeine, palonosetron, prednisone, sulfonamide derivatives, celecoxib, sulfa antibiotics, and vitamin b12.  MEDICATIONS:  Current Outpatient Medications  Medication Sig Dispense Refill   anastrozole (ARIMIDEX) 1 MG tablet Take 1 tablet (1 mg total) by mouth daily. 90 tablet 4   apixaban (ELIQUIS) 5 MG TABS tablet Take 1 tablet (5 mg total) by mouth 2 (two) times daily. 180 tablet 1   clotrimazole-betamethasone (LOTRISONE) cream APPLY  CREAM TOPICALLY TWICE DAILY 45 g 0   dexamethasone (DECADRON) 2 MG tablet Take 2 tablets (4 mg total) by mouth daily for 7 days, THEN 1 tablet (2 mg total) daily for 7 days. 21 tablet 0   esomeprazole (NEXIUM) 40 MG capsule Take 1 capsule (40 mg total) by mouth daily. 90 capsule 1   fluticasone (FLONASE) 50 MCG/ACT nasal spray Place 2 sprays into both nostrils daily. 16 g 6   furosemide (LASIX) 20 MG tablet Take 1 tablet (20 mg total) by mouth daily. (Patient not taking: Reported on 10/11/2022) 90 tablet 1   gabapentin (NEURONTIN) 300 MG capsule Take 1 capsule (300 mg total) by mouth 3 (three) times daily. 90 capsule 5   loratadine (CLARITIN) 10 MG tablet Take 10 mg by mouth  daily.     Lurasidone HCl (LATUDA) 60 MG TABS TAKE 1 TABLET BY MOUTH ONCE DAILY 90 tablet 1   metoprolol tartrate (LOPRESSOR) 25 MG tablet Take 1 tablet (25 mg total) by mouth 2 (two) times daily. 180 tablet 1   midodrine (PROAMATINE) 2.5 MG tablet Take 1 tablet (2.5 mg total) by mouth 3 (three) times daily with meals. 270 tablet 3    ondansetron (ZOFRAN) 8 MG tablet Take 1 tablet (8 mg total) by mouth every 8 (eight) hours as needed for nausea. 30 tablet 3   oxyCODONE (OXYCONTIN) 10 mg 12 hr tablet Take 1 tablet (10 mg total) by mouth every 12 (twelve) hours. 30 tablet 0   oxyCODONE-acetaminophen (PERCOCET) 7.5-325 MG tablet Take 1 tablet by mouth every 6 (six) hours as needed for severe pain. 60 tablet 0   pramipexole (MIRAPEX) 1 MG tablet TAKE 1 TABLET BY MOUTH THREE TIMES DAILY 90 tablet 0   prochlorperazine (COMPAZINE) 10 MG tablet Take 1 tablet (10 mg total) by mouth daily. 30 tablet 2   tizanidine (ZANAFLEX) 2 MG capsule Take 2 mg by mouth 3 (three) times daily as needed. (Patient not taking: Reported on 10/11/2022)     triamcinolone (KENALOG) 0.1 % Apply 1 application. topically 2 (two) times daily.     Vilazodone HCl (VIIBRYD) 40 MG TABS Take 1 tablet (40 mg total) by mouth daily. 90 tablet 1   No current facility-administered medications for this visit.   Facility-Administered Medications Ordered in Other Visits  Medication Dose Route Frequency Provider Last Rate Last Admin   heparin lock flush 100 unit/mL  500 Units Intravenous Once Magrinat, Valentino Hue, MD       sodium chloride flush (NS) 0.9 % injection 10 mL  10 mL Intravenous PRN Magrinat, Valentino Hue, MD        VITAL SIGNS: BP 109/63 (BP Location: Left Arm, Patient Position: Sitting)   Pulse 72   Temp 98.1 F (36.7 C) (Oral)   Resp 13   Wt 105.2 kg   SpO2 99%   BMI 39.81 kg/m  Filed Weights   10/31/22 1347  Weight: 105.2 kg    Estimated body mass index is 39.81 kg/m as calculated from the following:   Height as of 10/25/22: 5\' 4"  (1.626 m).   Weight as of this encounter: 105.2 kg.   PERFORMANCE STATUS (ECOG) : 2 - Symptomatic, <50% confined to bed   Physical Exam: General: appears uncomfortable, observed moving up and down in wheelchair Cardiovascular: regular rate and rhythm Pulmonary: clear ant fields Abdomen: soft, nontender, + bowel  sounds Extremities: no edema, no joint deformities Skin: no rashes Neurological: alert, oriented, involuntary tongue movements  IMPRESSION: [name] presents to this visit with husband. She is alert, oriented, and engaging. Endorses severe spinal pain as being her chief concern.   Reports dyspnea is at her baseline. Denies issues with nausea or vomiting. Constipation is controlled on current regimen. Reports increase in urinary leakage and is now wearing pads to help with that.   Neoplasm related pain Mrs. Sproull observed moving up and down in wheelchair with discomfort. She reports severe pain to her spine and states "something must be done." When asked to describe pain, she endorses multiple descriptors including aching, sharp, and burning.   Upon further questioning, learned that patient has been taking OxyContin 10 mg on an as-needed basis, usually once per day at night. Reports taking Gabapentin as prescribed 300 mg TID. She is on second week of  Dexamethasone (  per day) and will be completing that course soon. She is taking Percocet 7.5/325 mg every 6 hours as needed.  Education provided regarding OxyContin being a long-acting pain medication and the need to take it on a scheduled, every 12 hour basis, for it to be effective. Mrs. Rena and her husband verbalize understanding and she states she will start doing this.   Radiation appointment scheduled after this visit and Mrs. Berti reports that she has not take anything for pre-medication. Toradol injection administered at conclusion of this visit to improve bone pain and tolerance of up-coming radiation treatment.    2.   Goals of care Last night, Mrs. Frerking reports meeting goal of being able to lie down in her own bed. She reports that she slept beside her husband for about 5 hours before having to get up and sleep in a chair. She is hopeful that by taking the OxyContin every 12 hours she will be able to sleep in the bed more often.  Per  discussion on 10/23/22, patient hopeful for stability/improvement with upcoming therapies.  We discussed Her current illness and what it means in the larger context of her on-going co-morbidities. Natural disease trajectory and expectations were discussed. We discussed the importance of continued conversation with family and their medical providers regarding overall plan of care and treatment options, ensuring decisions are within the context of the patients values and GOCs.  PLAN: Educated regarding OxyContin 10 mg PO BID as a scheduled medication. Patient to begin taking this way. Continue Gabapentin 300 mg TID. Dexamethasone 4 mg daily x 7 days (completed) then 2 mg daily x 7 days (in progress). Percocet 7.5/325 every 6 hours as needed for breakthrough pain.   Encouraged patient to bring medications to appointments as she may need to premedicate prior to her radiation treatments.  Advised to take 30-45 minutes prior to visit as needed. Toradol injection administered at end of visit today. Palliative will plan to see patient back in 1-2 weeks in collaboration to other oncology appointments   Patient expressed understanding and was in agreement with this plan. She also understands that she an call the clinic at any time with any questions, concerns, or complaints.  Any controlled substances utilized were prescribed in the context of palliative care. PDMP has been reviewed.    I assessed patient with Marylene Land, NP Student. Agree with above findings.   Visit consisted of counseling and education dealing with the complex and emotionally intense issues of symptom management and palliative care in the setting of serious and potentially life-threatening illness.Greater than 50%  of this time was spent counseling and coordinating care related to the above assessment and plan.  Signed by: Katy Apo, RN MSN [facility] / NP Student  Willette Alma, AGPCNP-BC  Palliative Medicine Team/[facility]  *Please note that this is a verbal dictation therefore any spelling or grammatical errors are due to the "[employer]agon [employer] One" system interpretation.

## 2022-11-01 ENCOUNTER — Other Ambulatory Visit: Payer: Self-pay

## 2022-11-01 ENCOUNTER — Ambulatory Visit
Admission: RE | Admit: 2022-11-01 | Discharge: 2022-11-01 | Disposition: A | Discharge status: Home / Self Care | Payer: BC Managed Care – PPO | Source: Ambulatory Visit | Attending: Radiation Oncology | Admitting: Radiation Oncology

## 2022-11-01 ENCOUNTER — Telehealth: Payer: Self-pay | Admitting: *Deleted

## 2022-11-01 DIAGNOSIS — Z170 Estrogen receptor positive status [ER+]: Secondary | ICD-10-CM | POA: Diagnosis not present

## 2022-11-01 DIAGNOSIS — Z79899 Other long term (current) drug therapy: Secondary | ICD-10-CM | POA: Diagnosis not present

## 2022-11-01 DIAGNOSIS — C7951 Secondary malignant neoplasm of bone: Secondary | ICD-10-CM | POA: Diagnosis not present

## 2022-11-01 DIAGNOSIS — Z79811 Long term (current) use of aromatase inhibitors: Secondary | ICD-10-CM | POA: Diagnosis not present

## 2022-11-01 DIAGNOSIS — Z510 Encounter for antineoplastic radiation therapy: Secondary | ICD-10-CM | POA: Diagnosis not present

## 2022-11-01 DIAGNOSIS — Z515 Encounter for palliative care: Secondary | ICD-10-CM | POA: Diagnosis not present

## 2022-11-01 DIAGNOSIS — C50411 Malignant neoplasm of upper-outer quadrant of right female breast: Secondary | ICD-10-CM | POA: Diagnosis not present

## 2022-11-01 LAB — RAD ONC ARIA SESSION SUMMARY
Course Elapsed Days: 9
Plan Fractions Treated to Date: 8
Plan Fractions Treated to Date: 8
Plan Fractions Treated to Date: 8
Plan Prescribed Dose Per Fraction: 3 Gy
Plan Prescribed Dose Per Fraction: 3 Gy
Plan Prescribed Dose Per Fraction: 3 Gy
Plan Total Fractions Prescribed: 10
Plan Total Fractions Prescribed: 10
Plan Total Fractions Prescribed: 10
Plan Total Prescribed Dose: 30 Gy
Plan Total Prescribed Dose: 30 Gy
Plan Total Prescribed Dose: 30 Gy
Reference Point Dosage Given to Date: 24 Gy
Reference Point Dosage Given to Date: 24 Gy
Reference Point Dosage Given to Date: 24 Gy
Reference Point Session Dosage Given: 3 Gy
Reference Point Session Dosage Given: 3 Gy
Reference Point Session Dosage Given: 3 Gy
Session Number: 8

## 2022-11-01 NOTE — Telephone Encounter (Addendum)
This RN contacted path at [facility]- spoke with Deanna Artis - verified MD request for path would be sent for breast prognostic panel.    ----- Message from Rachel Moulds, MD sent at 11/01/2022  8:34 AM EDT ----- Val, can we make sure they do prognostics on this please.  Thanks

## 2022-11-02 ENCOUNTER — Other Ambulatory Visit: Payer: Self-pay

## 2022-11-02 ENCOUNTER — Ambulatory Visit
Admission: RE | Admit: 2022-11-02 | Discharge: 2022-11-02 | Disposition: A | Discharge status: Home / Self Care | Payer: BC Managed Care – PPO | Source: Ambulatory Visit | Attending: Radiation Oncology | Admitting: Radiation Oncology

## 2022-11-02 DIAGNOSIS — Z515 Encounter for palliative care: Secondary | ICD-10-CM | POA: Diagnosis not present

## 2022-11-02 DIAGNOSIS — C50411 Malignant neoplasm of upper-outer quadrant of right female breast: Secondary | ICD-10-CM | POA: Diagnosis not present

## 2022-11-02 DIAGNOSIS — Z79899 Other long term (current) drug therapy: Secondary | ICD-10-CM | POA: Diagnosis not present

## 2022-11-02 DIAGNOSIS — Z510 Encounter for antineoplastic radiation therapy: Secondary | ICD-10-CM | POA: Diagnosis not present

## 2022-11-02 DIAGNOSIS — Z170 Estrogen receptor positive status [ER+]: Secondary | ICD-10-CM | POA: Diagnosis not present

## 2022-11-02 DIAGNOSIS — C7951 Secondary malignant neoplasm of bone: Secondary | ICD-10-CM | POA: Diagnosis not present

## 2022-11-02 DIAGNOSIS — Z79811 Long term (current) use of aromatase inhibitors: Secondary | ICD-10-CM | POA: Diagnosis not present

## 2022-11-02 LAB — RAD ONC ARIA SESSION SUMMARY
Course Elapsed Days: 10
Plan Fractions Treated to Date: 9
Plan Fractions Treated to Date: 9
Plan Fractions Treated to Date: 9
Plan Prescribed Dose Per Fraction: 3 Gy
Plan Prescribed Dose Per Fraction: 3 Gy
Plan Prescribed Dose Per Fraction: 3 Gy
Plan Total Fractions Prescribed: 10
Plan Total Fractions Prescribed: 10
Plan Total Fractions Prescribed: 10
Plan Total Prescribed Dose: 30 Gy
Plan Total Prescribed Dose: 30 Gy
Plan Total Prescribed Dose: 30 Gy
Reference Point Dosage Given to Date: 27 Gy
Reference Point Dosage Given to Date: 27 Gy
Reference Point Dosage Given to Date: 27 Gy
Reference Point Session Dosage Given: 3 Gy
Reference Point Session Dosage Given: 3 Gy
Reference Point Session Dosage Given: 3 Gy
Session Number: 9

## 2022-11-03 ENCOUNTER — Ambulatory Visit
Admission: RE | Admit: 2022-11-03 | Discharge: 2022-11-03 | Disposition: A | Discharge status: Home / Self Care | Payer: BC Managed Care – PPO | Source: Ambulatory Visit | Attending: Radiation Oncology | Admitting: Radiation Oncology

## 2022-11-03 ENCOUNTER — Encounter: Payer: Self-pay | Admitting: Radiation Oncology

## 2022-11-03 ENCOUNTER — Other Ambulatory Visit: Payer: Self-pay

## 2022-11-03 DIAGNOSIS — Z79899 Other long term (current) drug therapy: Secondary | ICD-10-CM | POA: Diagnosis not present

## 2022-11-03 DIAGNOSIS — C50411 Malignant neoplasm of upper-outer quadrant of right female breast: Secondary | ICD-10-CM | POA: Diagnosis not present

## 2022-11-03 DIAGNOSIS — Z515 Encounter for palliative care: Secondary | ICD-10-CM | POA: Diagnosis not present

## 2022-11-03 DIAGNOSIS — Z79811 Long term (current) use of aromatase inhibitors: Secondary | ICD-10-CM | POA: Diagnosis not present

## 2022-11-03 DIAGNOSIS — Z510 Encounter for antineoplastic radiation therapy: Secondary | ICD-10-CM | POA: Diagnosis not present

## 2022-11-03 DIAGNOSIS — Z170 Estrogen receptor positive status [ER+]: Secondary | ICD-10-CM | POA: Diagnosis not present

## 2022-11-03 DIAGNOSIS — C7951 Secondary malignant neoplasm of bone: Secondary | ICD-10-CM | POA: Diagnosis not present

## 2022-11-03 LAB — RAD ONC ARIA SESSION SUMMARY
Course Elapsed Days: 11
Plan Fractions Treated to Date: 10
Plan Fractions Treated to Date: 10
Plan Fractions Treated to Date: 10
Plan Prescribed Dose Per Fraction: 3 Gy
Plan Prescribed Dose Per Fraction: 3 Gy
Plan Prescribed Dose Per Fraction: 3 Gy
Plan Total Fractions Prescribed: 10
Plan Total Fractions Prescribed: 10
Plan Total Fractions Prescribed: 10
Plan Total Prescribed Dose: 30 Gy
Plan Total Prescribed Dose: 30 Gy
Plan Total Prescribed Dose: 30 Gy
Reference Point Dosage Given to Date: 30 Gy
Reference Point Dosage Given to Date: 30 Gy
Reference Point Dosage Given to Date: 30 Gy
Reference Point Session Dosage Given: 3 Gy
Reference Point Session Dosage Given: 3 Gy
Reference Point Session Dosage Given: 3 Gy
Session Number: 10

## 2022-11-06 NOTE — Progress Notes (Unsigned)
Palliative Medicine [facility]  Telephone:([phone]) [phone] Fax:([phone]) [phone]   Name: Doris Smith Date: 11/06/2022 MRN: [id number]  DOB: 01/06/1951  Patient Care Team: [name], FNP as PCP - General (Family Medicine) Jodelle Gross, NP as PCP - Cardiology (Nurse Practitioner) Ovidio Kin, MD as Consulting Physician (General Surgery) Dorothy Puffer, MD as Consulting Physician (Radiation Oncology) Lysle Pearl, MD as Consulting Physician (Pulmonary Disease) Beverely Low, MD as Consulting Physician (Orthopedic Surgery) Jene Every, MD as Consulting Physician (Orthopedic Surgery) Oneta Rack, NP as Nurse Practitioner (Adult Health Nurse Practitioner) Pershing Proud, RN as Oncology Nurse Navigator Donnelly Angelica, RN as Oncology Nurse Navigator Glenna Fellows, MD as Consulting Physician (Plastic Surgery) Rollene Rotunda, MD as Consulting Physician (Cardiology) Rachel Moulds, MD as Medical Oncologist (Hematology and Oncology)    INTERVAL HISTORY: Doris Smith is a 72 y.o. female with oncologic medical history including estrogen receptor positive breast cancer(07/2018) s/p right mastectomy. PET scan 10/06/22 showing widespread osseous metastasis. Other pertinent history includes atrial fibrillation, asthma, OSA, GERD, fatty liver, fibromyalgia, osteoporosis, chronic headaches, anxiety, depression, and bipolar disorder. Underwent ORIF of right hip 09/20/2022. Palliative ask to see for symptom and pain management and goals of care.   SOCIAL HISTORY:    Doris Smith reports that she has never smoked. She has never used smokeless tobacco. She reports that she does not drink alcohol and does not use drugs.  ADVANCE DIRECTIVES:  Advanced directives on file, Doris Smith, spouse, was identified as Management consultant should patient be unable to make decision for herself   CODE STATUS: Full code  PAST MEDICAL HISTORY: Past Medical  History:  Diagnosis Date   A-fib (HCC) 11/2012   PAF   Anxiety    takes Ativan   Asthma 04/07/2011   dx   Bipolar disorder (HCC)    Cancer (HCC)    Right breast   Depression    Early cataracts, bilateral    Fatty liver    Fibromyalgia    GERD (gastroesophageal reflux disease)    Irritable bowel syndrome    Mental disorder    dx bipolar   PONV (postoperative nausea and vomiting)    Sleep apnea 04/2011    does not wear CPAP    ALLERGIES:  is allergic to iohexol, codeine, palonosetron, prednisone, sulfonamide derivatives, celecoxib, sulfa antibiotics, and vitamin b12.  MEDICATIONS:  Current Outpatient Medications  Medication Sig Dispense Refill   anastrozole (ARIMIDEX) 1 MG tablet Take 1 tablet (1 mg total) by mouth daily. 90 tablet 4   apixaban (ELIQUIS) 5 MG TABS tablet Take 1 tablet (5 mg total) by mouth 2 (two) times daily. 180 tablet 1   clotrimazole-betamethasone (LOTRISONE) cream APPLY  CREAM TOPICALLY TWICE DAILY 45 g 0   esomeprazole (NEXIUM) 40 MG capsule Take 1 capsule (40 mg total) by mouth daily. 90 capsule 1   fluticasone (FLONASE) 50 MCG/ACT nasal spray Place 2 sprays into both nostrils daily. 16 g 6   furosemide (LASIX) 20 MG tablet Take 1 tablet (20 mg total) by mouth daily. (Patient not taking: Reported on 10/11/2022) 90 tablet 1   gabapentin (NEURONTIN) 300 MG capsule Take 1 capsule (300 mg total) by mouth 3 (three) times daily. 90 capsule 5   loratadine (CLARITIN) 10 MG tablet Take 10 mg by mouth daily.     Lurasidone HCl (LATUDA) 60 MG TABS TAKE 1 TABLET BY MOUTH ONCE DAILY 90 tablet 1   metoprolol tartrate (LOPRESSOR) 25 MG tablet  Take 1 tablet (25 mg total) by mouth 2 (two) times daily. 180 tablet 1   midodrine (PROAMATINE) 2.5 MG tablet Take 1 tablet (2.5 mg total) by mouth 3 (three) times daily with meals. 270 tablet 3   ondansetron (ZOFRAN) 8 MG tablet Take 1 tablet (8 mg total) by mouth every 8 (eight) hours as needed for nausea. 30 tablet 3   oxyCODONE  (OXYCONTIN) 10 mg 12 hr tablet Take 1 tablet (10 mg total) by mouth every 12 (twelve) hours. 30 tablet 0   oxyCODONE-acetaminophen (PERCOCET) 7.5-325 MG tablet Take 1 tablet by mouth every 6 (six) hours as needed for severe pain. 60 tablet 0   pramipexole (MIRAPEX) 1 MG tablet TAKE 1 TABLET BY MOUTH THREE TIMES DAILY 90 tablet 0   prochlorperazine (COMPAZINE) 10 MG tablet Take 1 tablet (10 mg total) by mouth daily. 30 tablet 2   tizanidine (ZANAFLEX) 2 MG capsule Take 2 mg by mouth 3 (three) times daily as needed. (Patient not taking: Reported on 10/11/2022)     triamcinolone (KENALOG) 0.1 % Apply 1 application. topically 2 (two) times daily.     Vilazodone HCl (VIIBRYD) 40 MG TABS Take 1 tablet (40 mg total) by mouth daily. 90 tablet 1   No current facility-administered medications for this visit.   Facility-Administered Medications Ordered in Other Visits  Medication Dose Route Frequency Provider Last Rate Last Admin   heparin lock flush 100 unit/mL  500 Units Intravenous Once Magrinat, Valentino Hue, MD       sodium chloride flush (NS) 0.9 % injection 10 mL  10 mL Intravenous PRN Magrinat, Valentino Hue, MD        VITAL SIGNS: There were no vitals taken for this visit. There were no vitals filed for this visit.   Estimated body mass index is 39.81 kg/m as calculated from the following:   Height as of 10/25/22: 5\' 4"  (1.626 m).   Weight as of 10/31/22: 231 lb 14.4 oz (105.2 kg).   PERFORMANCE STATUS (ECOG) : 2 - Symptomatic, <50% confined to bed   Physical Exam: General: appears uncomfortable, observed shifting her weight in recliner chair Cardiovascular: regular rate and rhythm Pulmonary: clear ant fields Abdomen: soft, nontender, + bowel sounds Extremities: no edema, no joint deformities Skin: petechiae to bilateral arms Neurological: alert, oriented, involuntary tongue movements  IMPRESSION: Doris Smith is seen in the infusion area. Her husband is present for visit.  Neoplasm related  pain Doris Smith observed shifting her weight in recliner chair with discomfort. She reports severe pain to her ribs and lower back. Explains that she forgot to take Percocet before leaving home for her infusion treatment. Toradol 15 mg IV and Percocet 5/325 mg PO administered during this visit. Doris Smith reports some relief within 10-15 minutes of medication administration.  Verified that patient is now consistently taking her OxyContin 10 mg twice per day. Percocet 7.5/325 mg available every 6 hours as needed for severe pain. Doris Smith is taking this 1-2 times per day. Expresses hesitation to take out of concern for addiction.  Doris Smith  is taking Gabapentin as prescribed, 300 mg three times per day. Husband asks if this can be increased. Discussed with patient and provider. Will increase night-time dose of Gabapentin to 600 mg. Patient and husband verbalize understanding that if she tolerates this night-time increase - after several days - may increase the mid-day dose to 600 mg as well.     2.   Constipation Doris Smith endorses having  a bowel movement every other day. Education provided regarding high potential for constipation in the setting of opioid use. Doris Smith has some MiraLax at home and plans to begin using this.   3.   Goals of Care Since the night of 10/30/22, Doris Smith has continued to meet goal of lying in her own bed at night beside her husband. She expresses appreciation for the care of everyone at the cancer center and is hopeful for continued improvement.  We discussed Her current illness and what it means in the larger context of her on-going co-morbidities. Natural disease trajectory and expectations were discussed. We discussed the importance of continued conversation with family and their medical providers regarding overall plan of care and treatment options, ensuring decisions are within the context of the patients values and GOCs.  PLAN: Administer Toradol 15 mg IV and Percocet  5/325 mg PO at visit today. OxyContin 15 mg PO twice daily. Increase night-time dose of Gabapentin. Take Gabapentin 300 mg every morning, 300 mg mid-day, and 600 mg at bedtime. (May increase mid-day dose to 600 mg after several days if tolerating the night-time increase well). Percocet 7.5/325 every 6 hours as needed for breakthrough pain.   Miralax for constipation. Encouraged patient to bring medications to appointments as she may need to premedicate for pain prior to treatments.  Palliative will plan to see patient back in 1-2 weeks in collaboration to other oncology appointments   Patient expressed understanding and was in agreement with this plan. She also understands that she an call the clinic at any time with any questions, concerns, or complaints.  Any controlled substances utilized were prescribed in the context of palliative care. PDMP has been reviewed.   I assessed patient with Marylene Land, NP Student. Agree with above findings.   Visit consisted of counseling and education dealing with the complex and emotionally intense issues of symptom management and palliative care in the setting of serious and potentially life-threatening illness.Greater than 50%  of this time was spent counseling and coordinating care related to the above assessment and plan.   Signed by: Katy Apo, RN MSN [facility] / NP Student  Willette Alma, AGPCNP-BC  Palliative Medicine Team/[facility]  *Please note that this is a verbal dictation therefore any spelling or grammatical errors are due to the "Dragon [employer] One" system interpretation.

## 2022-11-07 ENCOUNTER — Inpatient Hospital Stay: Payer: BC Managed Care – PPO

## 2022-11-07 ENCOUNTER — Telehealth: Payer: Self-pay | Admitting: *Deleted

## 2022-11-07 ENCOUNTER — Inpatient Hospital Stay (HOSPITAL_BASED_OUTPATIENT_CLINIC_OR_DEPARTMENT_OTHER): Payer: BC Managed Care – PPO | Admitting: Hematology and Oncology

## 2022-11-07 ENCOUNTER — Other Ambulatory Visit: Payer: Self-pay

## 2022-11-07 ENCOUNTER — Inpatient Hospital Stay (HOSPITAL_BASED_OUTPATIENT_CLINIC_OR_DEPARTMENT_OTHER): Payer: BC Managed Care – PPO | Admitting: Nurse Practitioner

## 2022-11-07 VITALS — BP 139/49 | HR 83

## 2022-11-07 VITALS — BP 126/48 | HR 101 | Temp 97.0°F | Resp 18 | Wt 225.9 lb

## 2022-11-07 DIAGNOSIS — G893 Neoplasm related pain (acute) (chronic): Secondary | ICD-10-CM | POA: Diagnosis not present

## 2022-11-07 DIAGNOSIS — Z515 Encounter for palliative care: Secondary | ICD-10-CM | POA: Diagnosis not present

## 2022-11-07 DIAGNOSIS — C50411 Malignant neoplasm of upper-outer quadrant of right female breast: Secondary | ICD-10-CM

## 2022-11-07 DIAGNOSIS — K5903 Drug induced constipation: Secondary | ICD-10-CM

## 2022-11-07 DIAGNOSIS — Z170 Estrogen receptor positive status [ER+]: Secondary | ICD-10-CM

## 2022-11-07 DIAGNOSIS — Z510 Encounter for antineoplastic radiation therapy: Secondary | ICD-10-CM | POA: Diagnosis not present

## 2022-11-07 DIAGNOSIS — C7951 Secondary malignant neoplasm of bone: Secondary | ICD-10-CM | POA: Diagnosis not present

## 2022-11-07 DIAGNOSIS — R530 Neoplastic (malignant) related fatigue: Secondary | ICD-10-CM | POA: Diagnosis not present

## 2022-11-07 DIAGNOSIS — Z79899 Other long term (current) drug therapy: Secondary | ICD-10-CM | POA: Diagnosis not present

## 2022-11-07 DIAGNOSIS — Z7189 Other specified counseling: Secondary | ICD-10-CM

## 2022-11-07 DIAGNOSIS — Z79811 Long term (current) use of aromatase inhibitors: Secondary | ICD-10-CM | POA: Diagnosis not present

## 2022-11-07 LAB — CBC WITH DIFFERENTIAL/PLATELET
Abs Immature Granulocytes: 0.1 10*3/uL — ABNORMAL HIGH (ref 0.00–0.07)
Basophils Absolute: 0 10*3/uL (ref 0.0–0.1)
Basophils Relative: 0 %
Eosinophils Absolute: 0.1 10*3/uL (ref 0.0–0.5)
Eosinophils Relative: 2 %
HCT: 32 % — ABNORMAL LOW (ref 36.0–46.0)
Hemoglobin: 10.3 g/dL — ABNORMAL LOW (ref 12.0–15.0)
Immature Granulocytes: 2 %
Lymphocytes Relative: 2 %
Lymphs Abs: 0.1 10*3/uL — ABNORMAL LOW (ref 0.7–4.0)
MCH: 30 pg (ref 26.0–34.0)
MCHC: 32.2 g/dL (ref 30.0–36.0)
MCV: 93.3 fL (ref 80.0–100.0)
Monocytes Absolute: 0.7 10*3/uL (ref 0.1–1.0)
Monocytes Relative: 12 %
Neutro Abs: 4.8 10*3/uL (ref 1.7–7.7)
Neutrophils Relative %: 82 %
Platelets: 165 10*3/uL (ref 150–400)
RBC: 3.43 MIL/uL — ABNORMAL LOW (ref 3.87–5.11)
RDW: 17.2 % — ABNORMAL HIGH (ref 11.5–15.5)
WBC: 5.8 10*3/uL (ref 4.0–10.5)
nRBC: 1.4 % — ABNORMAL HIGH (ref 0.0–0.2)

## 2022-11-07 LAB — COMPREHENSIVE METABOLIC PANEL
ALT: 21 U/L (ref 0–44)
AST: 19 U/L (ref 15–41)
Albumin: 3.6 g/dL (ref 3.5–5.0)
Alkaline Phosphatase: 192 U/L — ABNORMAL HIGH (ref 38–126)
Anion gap: 9 (ref 5–15)
BUN: 17 mg/dL (ref 8–23)
CO2: 27 mmol/L (ref 22–32)
Calcium: 9.4 mg/dL (ref 8.9–10.3)
Chloride: 104 mmol/L (ref 98–111)
Creatinine, Ser: 0.85 mg/dL (ref 0.44–1.00)
GFR, Estimated: >60 mL/min (ref >60)
Glucose, Bld: 113 mg/dL — ABNORMAL HIGH (ref 70–99)
Potassium: 4.2 mmol/L (ref 3.5–5.1)
Sodium: 140 mmol/L (ref 135–145)
Total Bilirubin: 0.6 mg/dL (ref 0.3–1.2)
Total Protein: 6.5 g/dL (ref 6.5–8.1)

## 2022-11-07 LAB — SURGICAL PATHOLOGY

## 2022-11-07 MED ORDER — OXYCODONE HCL 5 MG PO TABS
5.0000 mg | ORAL_TABLET | Freq: Once | ORAL | Status: AC
  Administered 2022-11-07: 5 mg via ORAL
  Filled 2022-11-07: qty 1

## 2022-11-07 MED ORDER — KETOROLAC TROMETHAMINE 30 MG/ML IJ SOLN: 15.0000 mg | Freq: Once | INTRAMUSCULAR | Status: DC

## 2022-11-07 MED ORDER — KETOROLAC TROMETHAMINE 15 MG/ML IJ SOLN
15.0000 mg | Freq: Once | INTRAMUSCULAR | Status: AC
  Administered 2022-11-07: 15 mg via INTRAVENOUS
  Filled 2022-11-07: qty 1

## 2022-11-07 MED ORDER — FULVESTRANT 250 MG/5ML IM SOSY
500.0000 mg | PREFILLED_SYRINGE | Freq: Once | INTRAMUSCULAR | Status: AC
  Administered 2022-11-07: 500 mg via INTRAMUSCULAR
  Filled 2022-11-07: qty 10

## 2022-11-07 MED ORDER — ACETAMINOPHEN 325 MG PO TABS
325.0000 mg | ORAL_TABLET | Freq: Once | ORAL | Status: AC
  Administered 2022-11-07: 325 mg via ORAL
  Filled 2022-11-07: qty 1

## 2022-11-07 MED ORDER — ZOLEDRONIC ACID 4 MG/100ML IV SOLN
4.0000 mg | Freq: Once | INTRAVENOUS | Status: AC
  Administered 2022-11-07: 4 mg via INTRAVENOUS
  Filled 2022-11-07: qty 100

## 2022-11-07 MED ORDER — SODIUM CHLORIDE 0.9 % IV SOLN: Freq: Once | INTRAVENOUS | Status: AC

## 2022-11-07 NOTE — Patient Instructions (Signed)
Zoledronic Acid Injection (Cancer) What is this medication? ZOLEDRONIC ACID (ZOE le dron ik AS id) treats high calcium levels in the blood caused by cancer. It may also be used with chemotherapy to treat weakened bones caused by cancer. It works by slowing down the release of calcium from bones. This lowers calcium levels in your blood. It also makes your bones stronger and less likely to break (fracture). It belongs to a group of medications called bisphosphonates. This medicine may be used for other purposes; ask your health care provider or pharmacist if you have questions. COMMON BRAND NAME(S): Zometa, Zometa Powder What should I tell my care team before I take this medication? They need to know if you have any of these conditions: Dehydration Dental disease Kidney disease Liver disease Low levels of calcium in the blood Lung or breathing disease, such as asthma Receiving steroids, such as dexamethasone or prednisone An unusual or allergic reaction to zoledronic acid, other medications, foods, dyes, or preservatives Pregnant or trying to get pregnant Breast-feeding How should I use this medication? This medication is injected into a vein. It is given by your care team in a hospital or clinic setting. Talk to your care team about the use of this medication in children. Special care may be needed. Overdosage: If you think you have taken too much of this medicine contact a poison control center or emergency room at once. NOTE: This medicine is only for you. Do not share this medicine with others. What if I miss a dose? Keep appointments for follow-up doses. It is important not to miss your dose. Call your care team if you are unable to keep an appointment. What may interact with this medication? Certain antibiotics given by injection Diuretics, such as bumetanide, furosemide NSAIDs, medications for pain and inflammation, such as ibuprofen or naproxen Teriparatide Thalidomide This list  may not describe all possible interactions. Give your health care provider a list of all the medicines, herbs, non-prescription drugs, or dietary supplements you use. Also tell them if you smoke, drink alcohol, or use illegal drugs. Some items may interact with your medicine. What should I watch for while using this medication? Visit your care team for regular checks on your progress. It may be some time before you see the benefit from this medication. Some people who take this medication have severe bone, joint, or muscle pain. This medication may also increase your risk for jaw problems or a broken thigh bone. Tell your care team right away if you have severe pain in your jaw, bones, joints, or muscles. Tell you care team if you have any pain that does not go away or that gets worse. Tell your dentist and dental surgeon that you are taking this medication. You should not have major dental surgery while on this medication. See your dentist to have a dental exam and fix any dental problems before starting this medication. Take good care of your teeth while on this medication. Make sure you see your dentist for regular follow-up appointments. You should make sure you get enough calcium and vitamin D while you are taking this medication. Discuss the foods you eat and the vitamins you take with your care team. Check with your care team if you have severe diarrhea, nausea, and vomiting, or if you sweat a lot. The loss of too much body fluid may make it dangerous for you to take this medication. You may need bloodwork while taking this medication. Talk to your care team if   you wish to become pregnant or think you might be pregnant. This medication can cause serious birth defects. What side effects may I notice from receiving this medication? Side effects that you should report to your care team as soon as possible: Allergic reactions--skin rash, itching, hives, swelling of the face, lips, tongue, or  throat Kidney injury--decrease in the amount of urine, swelling of the ankles, hands, or feet Low calcium level--muscle pain or cramps, confusion, tingling, or numbness in the hands or feet Osteonecrosis of the jaw--pain, swelling, or redness in the mouth, numbness of the jaw, poor healing after dental work, unusual discharge from the mouth, visible bones in the mouth Severe bone, joint, or muscle pain Side effects that usually do not require medical attention (report to your care team if they continue or are bothersome): Constipation Fatigue Fever Loss of appetite Nausea Stomach pain This list may not describe all possible side effects. Call your doctor for medical advice about side effects. You may report side effects to FDA at 1-800-FDA-1088. Where should I keep my medication? This medication is given in a hospital or clinic. It will not be stored at home. NOTE: This sheet is a summary. It may not cover all possible information. If you have questions about this medicine, talk to your doctor, pharmacist, or health care provider.  2023 Elsevier/Gold Standard (2007-08-17 00:00:00)  Fulvestrant Injection What is this medication? FULVESTRANT (ful VES trant) treats breast cancer. It works by blocking the hormone estrogen in breast tissue, which prevents breast cancer cells from spreading or growing. This medicine may be used for other purposes; ask your health care provider or pharmacist if you have questions. COMMON BRAND NAME(S): FASLODEX What should I tell my care team before I take this medication? They need to know if you have any of these conditions: Bleeding disorder Liver disease Low blood cell levels, such as low white cells, red cells, and platelets An unusual or allergic reaction to fulvestrant, other medications, foods, dyes, or preservatives Pregnant or trying to get pregnant Breast-feeding How should I use this medication? This medication is injected into a muscle. It is  given by your care team in a hospital or clinic setting. Talk to your care team about the use of this medication in children. Special care may be needed. Overdosage: If you think you have taken too much of this medicine contact a poison control center or emergency room at once. NOTE: This medicine is only for you. Do not share this medicine with others. What if I miss a dose? Keep appointments for follow-up doses. It is important not to miss your dose. Call your care team if you are unable to keep an appointment. What may interact with this medication? Certain medications that prevent or treat blood clots, such as warfarin, enoxaparin, dalteparin, apixaban, dabigatran, rivaroxaban This list may not describe all possible interactions. Give your health care provider a list of all the medicines, herbs, non-prescription drugs, or dietary supplements you use. Also tell them if you smoke, drink alcohol, or use illegal drugs. Some items may interact with your medicine. What should I watch for while using this medication? Your condition will be monitored carefully while you are receiving this medication. You may need blood work while taking this medication. Talk to your care team if you may be pregnant. Serious birth defects can occur if you take this medication during pregnancy and for 1 year after the last dose. You will need a negative pregnancy test before starting this medication.   Contraception is recommended while taking this medication and for 1 year after the last dose. Your care team can help you find the option that works for you. Do not breastfeed while taking this medication and for 1 year after the last dose. This medication may cause infertility. Talk to your care team if you are concerned about your fertility. What side effects may I notice from receiving this medication? Side effects that you should report to your care team as soon as possible: Allergic reactions or angioedema--skin rash,  itching or hives, swelling of the face, eyes, lips, tongue, arms, or legs, trouble swallowing or breathing Pain, tingling, or numbness in the hands or feet Side effects that usually do not require medical attention (report to your care team if they continue or are bothersome): Bone, joint, or muscle pain Constipation Headache Hot flashes Nausea Pain, redness, or irritation at injection site Unusual weakness or fatigue This list may not describe all possible side effects. Call your doctor for medical advice about side effects. You may report side effects to FDA at 1-800-FDA-1088. Where should I keep my medication? This medication is given in a hospital or clinic. It will not be stored at home. NOTE: This sheet is a summary. It may not cover all possible information. If you have questions about this medicine, talk to your doctor, pharmacist, or health care provider.  2023 Elsevier/Gold Standard (2007-08-17 00:00:00)   

## 2022-11-07 NOTE — Telephone Encounter (Signed)
Per MD discussion with patient per zometa for pt's bone metastasis and dental clearance, pt stated she would like to proceed with start due to benefit outweighs risk of dental concerns.

## 2022-11-07 NOTE — Progress Notes (Signed)
[facility]  [phone] [phone]    ID: Doris Smith DOB: Jun 11, 1951  MR#: [id number]  JYN#:[id number]  Patient Care Team: Doris Pierini, FNP as PCP - General (Family Medicine) Doris Gross, NP as PCP - Cardiology (Nurse Practitioner) Doris Kin, MD as Consulting Physician (General Surgery) Doris Puffer, MD as Consulting Physician (Radiation Oncology) Doris Pearl, MD as Consulting Physician (Pulmonary Disease) Doris Low, MD as Consulting Physician (Orthopedic Surgery) Doris Every, MD as Consulting Physician (Orthopedic Surgery) Doris Rack, NP as Nurse Practitioner (Adult Health Nurse Practitioner) Doris Proud, RN as Oncology Nurse Navigator Doris Angelica, RN as Oncology Nurse Navigator Doris Fellows, MD as Consulting Physician (Plastic Surgery) Doris Rotunda, MD as Consulting Physician (Cardiology) Doris Moulds, MD as Medical Oncologist (Hematology and Oncology) Doris Moulds, MD OTHER MD: Doris Smith, psychiatry   CHIEF COMPLAINT: Estrogen receptor positive breast cancer (s/p right mastectomy)  CURRENT TREATMENT: anastrozole; ibandronate  INTERVAL HISTORY:  Doris Smith returns today for follow-up of her estrogen receptor positive breast cancer.  She is here for follow up with her husband. She is now on faslodex. She complains of unbearable pain today. She says she forgot to take her short acting medication today. Most of her pain is in her neck. She otherwise denies any major complaints today. Rest of the pertinent 10 point ROS reviewed and negative   HISTORY OF CURRENT ILLNESS: From the original intake note:  "Doris Smith" presented to her PCP, Doris Smith, with right breast pain, fullness, and nipple inversion since November 2019. She proceeded to undergo bilateral diagnostic mammography with tomography and right breast ultrasonography at [facility] on 07/31/2018 showing: findings  compatible with multicentric breast cancer spanning at least the upper inner and upper outer quadrants of the right breast; normal right axilla. Palpable firmness of approximately 1.5 cm was noted in the 9 o'clock position, which was also seen on ultrasound with indisctinct margins measuring 2.7 x 2.3 x 2.2 cm. At 1 o'clock, a mass with poorly defined margins measures 1.9 x 1 x 0.8 cm. Additional poorly defined masses were seen in the 12 o'clock retroareolar region.  Accordingly on 08/02/2018 she proceeded to biopsy of the right breast area in question. The pathology ([id number]) from this procedure showed: invasive mammary carcinoma at 9 o'clock and 1 o'clock; e-cadherin negative, consistent with lobular phenotype; grade 2-3. Prognostic indicators significant for: estrogen receptor, 90% positive and progesterone receptor, 1% positive, both with strong staining intensity. Proliferation marker Ki67 at 1%. HER2 equivocal by immunohistochemistry, 2+ but negative by fluorescent in situ hybridization with a signals ratio 1.04 and number per cell 1.2.   The patient's subsequent history is as detailed below.   PAST MEDICAL HISTORY: Past Medical History:  Diagnosis Date   A-fib (HCC) 11/2012   PAF   Anxiety    takes Ativan   Asthma 04/07/2011   dx   Bipolar disorder (HCC)    Cancer (HCC)    Right breast   Depression    Early cataracts, bilateral    Fatty liver    Fibromyalgia    GERD (gastroesophageal reflux disease)    Irritable bowel syndrome    Mental disorder    dx bipolar   PONV (postoperative nausea and vomiting)    Sleep apnea 04/2011    does not wear CPAP  She has chronic sinus headaches, hiatal hernia   PAST SURGICAL HISTORY: Past Surgical History:  Procedure Laterality Date   ABDOMINAL HYSTERECTOMY     COLONOSCOPY  DILATION AND CURETTAGE OF UTERUS  1981   abnormal pap   KNEE ARTHROSCOPY Left 2007   x 2   LUMBAR LAMINECTOMY/DECOMPRESSION MICRODISCECTOMY  05/31/2011    Procedure: LUMBAR LAMINECTOMY/DECOMPRESSION MICRODISCECTOMY;  Surgeon: Javier Docker;  Location: [facility];  Service: Orthopedics;  Laterality: Left;  Decompression Lumbar four to five and  Lumbar five to Sacral one on Left  (X-Ray)   MASTECTOMY W/ SENTINEL NODE BIOPSY Right 09/16/2018   Procedure: RIGHT MASTECTOMY WITH RIGHT AXILLARY SENTINEL LYMPH NODE BIOPSY;  Surgeon: Doris Kin, MD;  Location: [facility] OR;  Service: General;  Laterality: Right;   PARTIAL HYSTERECTOMY  1982   PORTACATH PLACEMENT Left 10/31/2018   Procedure: INSERTION PORT-A-CATH WITH ULTRASOUND;  Surgeon: Doris Kin, MD;  Location: [facility];  Service: General;  Laterality: Left;   TONSILLECTOMY     as child   TOTAL KNEE ARTHROPLASTY Left 12/18/2005    FAMILY HISTORY Family History  Problem Relation Age of Onset   Anesthesia problems Mother    Heart disease Mother        CHF, atrial fib   Heart failure Mother    Anesthesia problems Sister    Colon polyps Father    Heart disease Father        "Fluid around the heart"   Hypertension Sister    Breast cancer Maternal Aunt    Colon cancer Maternal Aunt    Prostate cancer Neg Hx    Ovarian cancer Neg Hx   As of February 2019, patient father is alive at 34 years old. Patient mother died at age 92 in June 01, 2020. She notes a family hx of breast cancer. A maternal aunt was diagnosed with breast cancer, but Doris Smith is unsure if it was in one or both breasts. She has 3 siblings, 3 sisters and 0 brothers.   GYNECOLOGIC HISTORY:  No LMP recorded. Patient is postmenopausal. Menarche: 72 years old Age at first live birth: 72 years old GX P 2 LMP about 42 years ago Contraceptive no HRT no  Hysterectomy? partial So? no   SOCIAL HISTORY:  Doris Smith worked in [employer] for 15 years. She is on disability because her back, knees, and nerves. Her husband, Doris Smith, has been at [employer] 43 years as a Estate agent.  Even though their home is in [place] they are  actually living in [place] in an apartment while her husband works there.  Son Doris Smith, age 30, works as a Surveyor, minerals in [place], [place]. Son Doris Hua, age 78, works as a Psychologist, occupational in [place], [place]. They have 4 grandchildren, 2 great-grandchildren with 2 more on the way. She attends a [employer].   ADVANCED DIRECTIVES: In the absence of any documents to the contrary the patient's husband is her healthcare power of attorney   HEALTH MAINTENANCE: Social History   Tobacco Use   Smoking status: Never   Smokeless tobacco: Never  Vaping Use   Vaping Use: Never used  Substance Use Topics   Alcohol use: No   Drug use: No     Colonoscopy: 2019  PAP: 11/03/2014, normal  Bone density: 10/13/2016, T-score of -2.1   Allergies  Allergen Reactions   Iohexol     "over 20 years ago" had reaction "hurting in arm and heart"-Done in [place], [place].Marland Kitchenphysician stopped CT at that time.   Codeine Other (See Comments)    Makes feel like she is wild    Palonosetron Other (See Comments)    headache   Prednisone Other (See  Comments)    Makes me very irritable   Sulfonamide Derivatives Other (See Comments)    Upset stomach   Celecoxib Nausea And Vomiting   Sulfa Antibiotics Nausea And Vomiting   Vitamin B12 Rash    Current Outpatient Medications  Medication Sig Dispense Refill   anastrozole (ARIMIDEX) 1 MG tablet Take 1 tablet (1 mg total) by mouth daily. 90 tablet 4   apixaban (ELIQUIS) 5 MG TABS tablet Take 1 tablet (5 mg total) by mouth 2 (two) times daily. 180 tablet 1   clotrimazole-betamethasone (LOTRISONE) cream APPLY  CREAM TOPICALLY TWICE DAILY 45 g 0   esomeprazole (NEXIUM) 40 MG capsule Take 1 capsule (40 mg total) by mouth daily. 90 capsule 1   fluticasone (FLONASE) 50 MCG/ACT nasal spray Place 2 sprays into both nostrils daily. 16 g 6   furosemide (LASIX) 20 MG tablet Take 1 tablet (20 mg total) by mouth daily. (Patient not taking: Reported on 10/11/2022) 90 tablet 1   gabapentin (NEURONTIN) 300 MG  capsule Take 1 capsule (300 mg total) by mouth 3 (three) times daily. 90 capsule 5   loratadine (CLARITIN) 10 MG tablet Take 10 mg by mouth daily.     Lurasidone HCl (LATUDA) 60 MG TABS TAKE 1 TABLET BY MOUTH ONCE DAILY 90 tablet 1   metoprolol tartrate (LOPRESSOR) 25 MG tablet Take 1 tablet (25 mg total) by mouth 2 (two) times daily. 180 tablet 1   midodrine (PROAMATINE) 2.5 MG tablet Take 1 tablet (2.5 mg total) by mouth 3 (three) times daily with meals. 270 tablet 3   ondansetron (ZOFRAN) 8 MG tablet Take 1 tablet (8 mg total) by mouth Smith 8 (eight) hours as needed for nausea. 30 tablet 3   oxyCODONE (OXYCONTIN) 10 mg 12 hr tablet Take 1 tablet (10 mg total) by mouth Smith 12 (twelve) hours. 30 tablet 0   oxyCODONE-acetaminophen (PERCOCET) 7.5-325 MG tablet Take 1 tablet by mouth Smith 6 (six) hours as needed for severe pain. 60 tablet 0   pramipexole (MIRAPEX) 1 MG tablet TAKE 1 TABLET BY MOUTH THREE TIMES DAILY 90 tablet 0   prochlorperazine (COMPAZINE) 10 MG tablet Take 1 tablet (10 mg total) by mouth daily. 30 tablet 2   tizanidine (ZANAFLEX) 2 MG capsule Take 2 mg by mouth 3 (three) times daily as needed. (Patient not taking: Reported on 10/11/2022)     triamcinolone (KENALOG) 0.1 % Apply 1 application. topically 2 (two) times daily.     Vilazodone HCl (VIIBRYD) 40 MG TABS Take 1 tablet (40 mg total) by mouth daily. 90 tablet 1   No current facility-administered medications for this visit.   Facility-Administered Medications Ordered in Other Visits  Medication Dose Route Frequency Provider Last Rate Last Admin   heparin lock flush 100 unit/mL  500 Units Intravenous Once Magrinat, Valentino Hue, MD       sodium chloride flush (NS) 0.9 % injection 10 mL  10 mL Intravenous PRN Magrinat, Valentino Hue, MD        OBJECTIVE: white woman who appears stated age  Vitals:   11/07/22 1418  BP: (!) 126/48  Pulse: (!) 101  Resp: 18  Temp: (!) 97 F (36.1 C)  SpO2: 99%   Wt Readings from Last 3  Encounters:  11/07/22 225 lb 14.4 oz (102.5 kg)  10/31/22 231 lb 14.4 oz (105.2 kg)  10/25/22 223 lb (101.2 kg)   Body mass index is 38.78 kg/m.    ECOG FS:2 - Symptomatic, <50% confined to  bed  Physical Exam Constitutional:      Appearance: Normal appearance.  Chest:  Breasts:    Left: No mass or tenderness.     Comments: Palpable bone tenderness left chest wall below the breast. Musculoskeletal:     Cervical back: No rigidity.  Lymphadenopathy:     Cervical: No cervical adenopathy.  Neurological:     Mental Status: She is alert.      LAB RESULTS:  CMP     Component Value Date/Time   NA 140 11/07/2022 1251   NA 145 (H) 06/05/2022 1024   K 4.2 11/07/2022 1251   CL 104 11/07/2022 1251   CO2 27 11/07/2022 1251   GLUCOSE 113 (H) 11/07/2022 1251   BUN 17 11/07/2022 1251   BUN 17 06/05/2022 1024   CREATININE 0.85 11/07/2022 1251   CREATININE 1.07 (H) 08/25/2022 0921   CALCIUM 9.4 11/07/2022 1251   PROT 6.5 11/07/2022 1251   PROT 6.6 06/05/2022 1024   ALBUMIN 3.6 11/07/2022 1251   ALBUMIN 4.0 06/05/2022 1024   AST 19 11/07/2022 1251   AST 22 08/25/2022 0921   ALT 21 11/07/2022 1251   ALT 26 08/25/2022 0921   ALKPHOS 192 (H) 11/07/2022 1251   BILITOT 0.6 11/07/2022 1251   BILITOT 0.5 08/25/2022 0921   GFRNONAA >60 11/07/2022 1251   GFRNONAA 56 (L) 08/25/2022 0921   GFRAA 81 02/03/2020 1049   GFRAA >60 12/19/2018 1434    No results found for: "TOTALPROTELP", "ALBUMINELP", "A1GS", "A2GS", "BETS", "BETA2SER", "GAMS", "MSPIKE", "SPEI"  No results found for: "KPAFRELGTCHN", "LAMBDASER", "KAPLAMBRATIO"  Lab Results  Component Value Date   WBC 5.8 11/07/2022   NEUTROABS 4.8 11/07/2022   HGB 10.3 (L) 11/07/2022   HCT 32.0 (L) 11/07/2022   MCV 93.3 11/07/2022   PLT 165 11/07/2022   No results found for: "LABCA2"  No components found for: "BJYNWG956"  No results for input(s): "INR" in the last 168 hours.  No results found for: "LABCA2"  No results found  for: "CAN199"  No results found for: "CAN125"  Lab Results  Component Value Date   CAN153 656.0 (H) 08/25/2022    Lab Results  Component Value Date   CA2729 2,169.4 (H) 08/25/2022    No components found for: "HGQUANT"  No results found for: "CEA1", "CEA" / No results found for: "CEA1", "CEA"   No results found for: "AFPTUMOR"  No results found for: "CHROMOGRNA"  No results found for: "HGBA", "HGBA2QUANT", "HGBFQUANT", "HGBSQUAN" (Hemoglobinopathy evaluation)   No results found for: "LDH"  No results found for: "IRON", "TIBC", "IRONPCTSAT" (Iron and TIBC)  No results found for: "FERRITIN"  Urinalysis    Component Value Date/Time   COLORURINE YELLOW 12/19/2018 1355   APPEARANCEUR Clear 02/03/2020 1045   LABSPEC 1.016 12/19/2018 1355   PHURINE 5.0 12/19/2018 1355   GLUCOSEU Negative 02/03/2020 1045   HGBUR NEGATIVE 12/19/2018 1355   BILIRUBINUR Negative 02/03/2020 1045   KETONESUR NEGATIVE 12/19/2018 1355   PROTEINUR Negative 02/03/2020 1045   PROTEINUR NEGATIVE 12/19/2018 1355   UROBILINOGEN negative 11/03/2014 1039   UROBILINOGEN 0.2 05/25/2011 0923   NITRITE Negative 02/03/2020 1045   NITRITE NEGATIVE 12/19/2018 1355   LEUKOCYTESUR Negative 02/03/2020 1045   LEUKOCYTESUR SMALL (A) 12/19/2018 1355    STUDIES: CT BIOPSY  Result Date: Oct 26, 2022 INDICATION: Osseous metastatic disease, metastatic breast cancer EXAM: CT CORE BIOPSY RIGHT ILIAC INFILTRATIVE METASTATIC PROCESS MEDICATIONS: 1% LIDOCAINE LOCAL ANESTHESIA/SEDATION: Moderate (conscious) sedation was employed during this procedure. A total of Versed 2.0 mg  and Fentanyl 100 mcg was administered intravenously by the radiology nurse. Total intra-service moderate Sedation Time: 9 minutes. The patient's level of consciousness and vital signs were monitored continuously by radiology nursing throughout the procedure under my direct supervision. COMPLICATIONS: None immediate. PROCEDURE: Informed written consent  was obtained from the patient after a thorough discussion of the procedural risks, benefits and alternatives. All questions were addressed. Maximal Sterile Barrier Technique was utilized including caps, mask, sterile gowns, sterile gloves, sterile drape, hand hygiene and skin antiseptic. A timeout was performed prior to the initiation of the procedure. previous imaging reviewed. patient positioned prone. noncontrast localization CT performed. the right iliac crest was localized and marked for biopsy. diffuse mixed sclerotic infiltrative metastatic process noted compatible with metastatic disease. under sterile conditions and local anesthesia, the 11 gauge core biopsy Smith was advanced into the right iliac bone. Smith position confirmed with CT. 11 gauge core biopsy obtained. sample placed in formalin. Smith removed. postprocedure imaging demonstrates no hemorrhage or hematoma. patient tolerated biopsy well. IMPRESSION: Successful CT-guided core biopsy of the right iliac infiltrative osseous metastatic process Electronically Signed   By: Judie Petit.  Shick M.D.   On: 10/25/2022 10:58      ELIGIBLE FOR AVAILABLE RESEARCH PROTOCOL: no   ASSESSMENT: 72 y.o. [place], [place] woman status post right breast biopsy 08/02/2018 for a clinical T3 N0, stage IIA invasive lobular carcinoma, grade 2, estrogen receptor strongly positive, progesterone receptor 1% positive, with no HER-2 amplification and an MIB-1 of 1%.  (a) CT scan of the head and chest, without contrast 08/23/2018 showed nonspecific 0.4 cm left lower lobe pulmonary nodule, no definitive metastatic disease  (b) bone scan 08/23/2018 shows multiple spinal areas of abnormal uptake, but  (c) total spinal MRI 09/03/2018 finds bone scan findings to be secondary to degenerative disease, no evidence of metastatic disease.  (1) status post right mastectomy and sentinel lymph node sampling 09/16/2018 showing a pT3 pN1(mic), stage IIA-IIIA invasive lobular breast cancer,  grade 2, with 1 of 5 sampled lymph nodes involved by micrometastatic deposit, with extra nodular extension; ample margins  (2)  MammaPrint "high risk" suggests a 5-year metastasis free survival of 93% with chemotherapy, with an absolute chemotherapy benefit in the greater than 12% range  (3) adjuvant radiation 12/31/2018-02/19/2019:  The right chest wall and regional nodes were treated to 50.4 Gy in 28 fractions followed by a 10 Gy boost over 5 fractions.   (4) anastrozole started neoadjuvantly (on 08/14/2018), discontinued 10/14/2018 in preparation for chemotherapy, resumed October 2020  (a) bone density 10/13/2016 showed osteopenia with a lowest score at -2.1  (b) ibandronate/Boniva started January 2021  (c) bone density 11/11/2020 shows a T score of -2.6  (5) Caris testing on mastectomy sample (09/16/2018) showed stable MSI and proficient mismatch repair status, with a Smith mutational burden; BRCA 1 and 2 were not mutated, PI K3 was not mutated, ER B B2 was not mutated, and AKT 1 was not mutated.  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  She had IR guided biopsy tomorrow to confirm histology.  This showed met breast cancer ER PR positive, Her 2 neg.  Her pain still appears to be poorly controlled. She is now seeing pain management and her medication is being titrated.  She understands that if the pathology confirms metastatic ER positive breast cancer, she may need an additional medication CDK 4 6 inhibitor along with Faslodex.  When she was in my clinic, we didn't have prognostics, hence we put an  additional phone call and the report got signed out after she left. She is not an excellent candidate for aggressive treatment, we can attempt CDK4/6 inhibition at a lower dose to see if she can tolerate it. Foundation one will also be sent. She also received palliative radiation to right help, chest wall and lumbar spine. She and husband were previously and today once again informed that this is metastatic breast cancer and intent of treatment is curative. I have clearly discussed about risk of osteonecrosis of jaw and hypocalcemia from zometa, she was given choice of waiting till formal dental clearance is obtained. She however denied any dental issues and would like to proceed with the treatment given metastatic breast cancer to the bone.  Total time spent: 30 minutes  *Total Encounter Time as defined by the Centers for Medicare and Medicaid Services includes, in addition to the face-to-face time of a patient visit (documented in the note above) non-face-to-face time: obtaining and reviewing outside history, ordering and reviewing medications, tests or procedures, care coordination (communications with other health care professionals or caregivers) and documentation in the medical record.

## 2022-11-08 ENCOUNTER — Ambulatory Visit (INDEPENDENT_AMBULATORY_CARE_PROVIDER_SITE_OTHER): Payer: BC Managed Care – PPO

## 2022-11-08 ENCOUNTER — Encounter: Payer: Self-pay | Admitting: Nurse Practitioner

## 2022-11-08 ENCOUNTER — Encounter: Payer: Self-pay | Admitting: Hematology and Oncology

## 2022-11-08 DIAGNOSIS — M80051D Age-related osteoporosis with current pathological fracture, right femur, subsequent encounter for fracture with routine healing: Secondary | ICD-10-CM | POA: Diagnosis not present

## 2022-11-08 DIAGNOSIS — G629 Polyneuropathy, unspecified: Secondary | ICD-10-CM

## 2022-11-08 DIAGNOSIS — F4323 Adjustment disorder with mixed anxiety and depressed mood: Secondary | ICD-10-CM | POA: Diagnosis not present

## 2022-11-08 DIAGNOSIS — D649 Anemia, unspecified: Secondary | ICD-10-CM

## 2022-11-08 DIAGNOSIS — I480 Paroxysmal atrial fibrillation: Secondary | ICD-10-CM | POA: Diagnosis not present

## 2022-11-08 DIAGNOSIS — K589 Irritable bowel syndrome without diarrhea: Secondary | ICD-10-CM

## 2022-11-08 DIAGNOSIS — G4733 Obstructive sleep apnea (adult) (pediatric): Secondary | ICD-10-CM

## 2022-11-08 DIAGNOSIS — J45909 Unspecified asthma, uncomplicated: Secondary | ICD-10-CM

## 2022-11-08 MED ORDER — OXYCODONE HCL ER 15 MG PO T12A
15.0000 mg | EXTENDED_RELEASE_TABLET | Freq: Two times a day (BID) | ORAL | 0 refills | Status: DC
Start: 2022-11-08 — End: 2022-11-15

## 2022-11-08 MED ORDER — OXYCODONE-ACETAMINOPHEN 7.5-325 MG PO TABS
1.0000 | ORAL_TABLET | Freq: Four times a day (QID) | ORAL | 0 refills | Status: DC | PRN
Start: 2022-11-08 — End: 2022-11-15

## 2022-11-11 ENCOUNTER — Other Ambulatory Visit: Payer: Self-pay | Admitting: Nurse Practitioner

## 2022-11-11 DIAGNOSIS — G2581 Restless legs syndrome: Secondary | ICD-10-CM

## 2022-11-13 ENCOUNTER — Encounter (HOSPITAL_COMMUNITY): Payer: Self-pay | Admitting: Hematology and Oncology

## 2022-11-14 ENCOUNTER — Telehealth: Payer: Self-pay | Admitting: *Deleted

## 2022-11-14 NOTE — Telephone Encounter (Signed)
This RN spoke with pt per her call asking " so what is next step in my treatment plan"  This RN reviewed chart discussed currently waiting on molecular studies from the biopsy to give [place] best direction in medications that can help control her cancer.  While on the phone discussion - pt began crying stating she wants to do what she needs to but "the pain is just not controlled and I am taking the pain medications and prescribed by your pain doctor"  Noted pt was seen by NP Nikki in Palliative on 11/08/2022.   This RN validated her concerns and that this RN will inform Nikki pain is not controlled well for best function and quality of life.  This RN did ask about bowel movement due to opiods induced constipation with [name] stating she has that controlled well.  This RN informed her above will be forwarded to [facility] and her nurse Maygan.  [name] also requested information about how to get papers for her husband to have FMLA related to her care- fax number of FMLA dept given.

## 2022-11-15 ENCOUNTER — Telehealth: Payer: Self-pay

## 2022-11-15 ENCOUNTER — Other Ambulatory Visit: Payer: Self-pay | Admitting: Nurse Practitioner

## 2022-11-15 ENCOUNTER — Other Ambulatory Visit (HOSPITAL_BASED_OUTPATIENT_CLINIC_OR_DEPARTMENT_OTHER): Payer: Self-pay

## 2022-11-15 ENCOUNTER — Encounter: Payer: Self-pay | Admitting: Hematology and Oncology

## 2022-11-15 ENCOUNTER — Other Ambulatory Visit: Payer: Self-pay

## 2022-11-15 DIAGNOSIS — Z515 Encounter for palliative care: Secondary | ICD-10-CM

## 2022-11-15 DIAGNOSIS — C50411 Malignant neoplasm of upper-outer quadrant of right female breast: Secondary | ICD-10-CM | POA: Diagnosis not present

## 2022-11-15 DIAGNOSIS — Z170 Estrogen receptor positive status [ER+]: Secondary | ICD-10-CM

## 2022-11-15 DIAGNOSIS — C7951 Secondary malignant neoplasm of bone: Secondary | ICD-10-CM | POA: Diagnosis not present

## 2022-11-15 DIAGNOSIS — G893 Neoplasm related pain (acute) (chronic): Secondary | ICD-10-CM

## 2022-11-15 MED ORDER — OXYCODONE HCL ER 10 MG PO T12A
10.0000 mg | EXTENDED_RELEASE_TABLET | Freq: Three times a day (TID) | ORAL | 0 refills | Status: DC
Start: 2022-11-15 — End: 2022-11-30

## 2022-11-15 MED ORDER — OXYCODONE HCL ER 15 MG PO T12A
15.0000 mg | EXTENDED_RELEASE_TABLET | Freq: Two times a day (BID) | ORAL | 0 refills | Status: DC
Start: 2022-11-15 — End: 2022-11-15
  Filled 2022-11-15: qty 60, 30d supply, fill #0

## 2022-11-15 MED ORDER — OXYCODONE-ACETAMINOPHEN 7.5-325 MG PO TABS
1.0000 | ORAL_TABLET | ORAL | 0 refills | Status: DC | PRN
Start: 2022-11-15 — End: 2022-11-30
  Filled 2022-11-15: qty 60, 10d supply, fill #0

## 2022-11-15 MED ORDER — OXYCODONE HCL ER 15 MG PO T12A
15.0000 mg | EXTENDED_RELEASE_TABLET | Freq: Two times a day (BID) | ORAL | 0 refills | Status: DC
Start: 2022-11-15 — End: 2022-11-15

## 2022-11-15 NOTE — Telephone Encounter (Signed)
Pt called in to the med-onc reporting that her pain was uncontrolled. This Rn called pt and per pt her pain is uncontrolled because she is not taking medication as frequently as she can. Pt pharmacy had orders for refill but the medication was not in stock. RN and NP spoke with pt, explained the need for another pharmacy and how to take the medications to optimize effects. Pt and husband verbalized understanding and callback number provided.

## 2022-11-15 NOTE — Telephone Encounter (Signed)
Pt called this morning X2, noticeably in distress about pain medication. Pt states, "I only have 1 pill left and I am frantic! I should not be in pain or go without pain medicine."  Reached out to palliative care RN to make her aware as they are managing the pt's pain. She states she will call the pt.

## 2022-11-15 NOTE — Telephone Encounter (Signed)
RN called pt back to inform of nee pharmacy pick up, pt verbalized understanding, no further questions at this time.

## 2022-11-15 NOTE — Telephone Encounter (Signed)
Pt called once again stating phx told her the oxycodone 15 mg 12 hr tab is going to cost $460 out of pocket. She states she does not have the money to pay for this. Pt is noticeably anxious, tearful and upset on the phone. Validated pt's feelings and helped with calming techniques to help her through this. Rickey Barbara, NP aware of the situation. She states pt's insurance requires alternate phx for this drug. She is speaking with the pt now.

## 2022-11-16 ENCOUNTER — Other Ambulatory Visit: Payer: Self-pay | Admitting: *Deleted

## 2022-11-16 ENCOUNTER — Other Ambulatory Visit: Payer: Self-pay

## 2022-11-16 DIAGNOSIS — J45909 Unspecified asthma, uncomplicated: Secondary | ICD-10-CM | POA: Diagnosis not present

## 2022-11-16 DIAGNOSIS — F4323 Adjustment disorder with mixed anxiety and depressed mood: Secondary | ICD-10-CM | POA: Diagnosis not present

## 2022-11-16 DIAGNOSIS — I480 Paroxysmal atrial fibrillation: Secondary | ICD-10-CM | POA: Diagnosis not present

## 2022-11-16 DIAGNOSIS — Z7901 Long term (current) use of anticoagulants: Secondary | ICD-10-CM | POA: Diagnosis not present

## 2022-11-16 DIAGNOSIS — G4733 Obstructive sleep apnea (adult) (pediatric): Secondary | ICD-10-CM | POA: Diagnosis not present

## 2022-11-16 DIAGNOSIS — Z853 Personal history of malignant neoplasm of breast: Secondary | ICD-10-CM | POA: Diagnosis not present

## 2022-11-16 DIAGNOSIS — S72001S Fracture of unspecified part of neck of right femur, sequela: Secondary | ICD-10-CM | POA: Diagnosis not present

## 2022-11-16 DIAGNOSIS — F319 Bipolar disorder, unspecified: Secondary | ICD-10-CM

## 2022-11-16 DIAGNOSIS — Z9181 History of falling: Secondary | ICD-10-CM | POA: Diagnosis not present

## 2022-11-16 DIAGNOSIS — K589 Irritable bowel syndrome without diarrhea: Secondary | ICD-10-CM | POA: Diagnosis not present

## 2022-11-16 DIAGNOSIS — G629 Polyneuropathy, unspecified: Secondary | ICD-10-CM | POA: Diagnosis not present

## 2022-11-16 DIAGNOSIS — D649 Anemia, unspecified: Secondary | ICD-10-CM | POA: Diagnosis not present

## 2022-11-16 DIAGNOSIS — M80051D Age-related osteoporosis with current pathological fracture, right femur, subsequent encounter for fracture with routine healing: Secondary | ICD-10-CM | POA: Diagnosis not present

## 2022-11-16 DIAGNOSIS — Z85830 Personal history of malignant neoplasm of bone: Secondary | ICD-10-CM | POA: Diagnosis not present

## 2022-11-16 MED ORDER — LURASIDONE HCL 60 MG PO TABS
ORAL_TABLET | ORAL | 0 refills | Status: DC
Start: 2022-11-16 — End: 2023-05-03

## 2022-11-20 DIAGNOSIS — K589 Irritable bowel syndrome without diarrhea: Secondary | ICD-10-CM | POA: Diagnosis not present

## 2022-11-20 DIAGNOSIS — S72001S Fracture of unspecified part of neck of right femur, sequela: Secondary | ICD-10-CM | POA: Diagnosis not present

## 2022-11-20 DIAGNOSIS — Z9181 History of falling: Secondary | ICD-10-CM | POA: Diagnosis not present

## 2022-11-20 DIAGNOSIS — G629 Polyneuropathy, unspecified: Secondary | ICD-10-CM | POA: Diagnosis not present

## 2022-11-20 DIAGNOSIS — J45909 Unspecified asthma, uncomplicated: Secondary | ICD-10-CM | POA: Diagnosis not present

## 2022-11-20 DIAGNOSIS — G4733 Obstructive sleep apnea (adult) (pediatric): Secondary | ICD-10-CM | POA: Diagnosis not present

## 2022-11-20 DIAGNOSIS — F4323 Adjustment disorder with mixed anxiety and depressed mood: Secondary | ICD-10-CM | POA: Diagnosis not present

## 2022-11-20 DIAGNOSIS — D649 Anemia, unspecified: Secondary | ICD-10-CM | POA: Diagnosis not present

## 2022-11-20 DIAGNOSIS — I480 Paroxysmal atrial fibrillation: Secondary | ICD-10-CM | POA: Diagnosis not present

## 2022-11-20 DIAGNOSIS — Z7901 Long term (current) use of anticoagulants: Secondary | ICD-10-CM | POA: Diagnosis not present

## 2022-11-20 DIAGNOSIS — M80051D Age-related osteoporosis with current pathological fracture, right femur, subsequent encounter for fracture with routine healing: Secondary | ICD-10-CM | POA: Diagnosis not present

## 2022-11-20 DIAGNOSIS — Z85830 Personal history of malignant neoplasm of bone: Secondary | ICD-10-CM | POA: Diagnosis not present

## 2022-11-20 DIAGNOSIS — Z853 Personal history of malignant neoplasm of breast: Secondary | ICD-10-CM | POA: Diagnosis not present

## 2022-11-21 ENCOUNTER — Inpatient Hospital Stay (HOSPITAL_BASED_OUTPATIENT_CLINIC_OR_DEPARTMENT_OTHER): Payer: BC Managed Care – PPO | Admitting: Nurse Practitioner

## 2022-11-21 ENCOUNTER — Other Ambulatory Visit: Payer: Self-pay

## 2022-11-21 ENCOUNTER — Encounter: Payer: Self-pay | Admitting: Nurse Practitioner

## 2022-11-21 ENCOUNTER — Inpatient Hospital Stay: Payer: BC Managed Care – PPO | Attending: Hematology and Oncology

## 2022-11-21 VITALS — BP 103/52 | HR 77 | Temp 98.7°F | Wt 231.1 lb

## 2022-11-21 DIAGNOSIS — C50411 Malignant neoplasm of upper-outer quadrant of right female breast: Secondary | ICD-10-CM

## 2022-11-21 DIAGNOSIS — M792 Neuralgia and neuritis, unspecified: Secondary | ICD-10-CM

## 2022-11-21 DIAGNOSIS — K5903 Drug induced constipation: Secondary | ICD-10-CM | POA: Diagnosis not present

## 2022-11-21 DIAGNOSIS — F4310 Post-traumatic stress disorder, unspecified: Secondary | ICD-10-CM | POA: Diagnosis not present

## 2022-11-21 DIAGNOSIS — G893 Neoplasm related pain (acute) (chronic): Secondary | ICD-10-CM

## 2022-11-21 DIAGNOSIS — Z923 Personal history of irradiation: Secondary | ICD-10-CM | POA: Insufficient documentation

## 2022-11-21 DIAGNOSIS — R530 Neoplastic (malignant) related fatigue: Secondary | ICD-10-CM

## 2022-11-21 DIAGNOSIS — Z79811 Long term (current) use of aromatase inhibitors: Secondary | ICD-10-CM | POA: Insufficient documentation

## 2022-11-21 DIAGNOSIS — F312 Bipolar disorder, current episode manic severe with psychotic features: Secondary | ICD-10-CM | POA: Diagnosis not present

## 2022-11-21 DIAGNOSIS — Z170 Estrogen receptor positive status [ER+]: Secondary | ICD-10-CM | POA: Insufficient documentation

## 2022-11-21 DIAGNOSIS — R110 Nausea: Secondary | ICD-10-CM | POA: Insufficient documentation

## 2022-11-21 DIAGNOSIS — Z79899 Other long term (current) drug therapy: Secondary | ICD-10-CM | POA: Insufficient documentation

## 2022-11-21 DIAGNOSIS — C50811 Malignant neoplasm of overlapping sites of right female breast: Secondary | ICD-10-CM | POA: Insufficient documentation

## 2022-11-21 DIAGNOSIS — Z9011 Acquired absence of right breast and nipple: Secondary | ICD-10-CM | POA: Insufficient documentation

## 2022-11-21 DIAGNOSIS — C7951 Secondary malignant neoplasm of bone: Secondary | ICD-10-CM | POA: Insufficient documentation

## 2022-11-21 DIAGNOSIS — F410 Panic disorder [episodic paroxysmal anxiety] without agoraphobia: Secondary | ICD-10-CM | POA: Diagnosis not present

## 2022-11-21 DIAGNOSIS — Z515 Encounter for palliative care: Secondary | ICD-10-CM

## 2022-11-21 NOTE — Progress Notes (Signed)
Palliative Medicine [facility]  Telephone:(336) [phone] [phone]) [phone]   Name: Doris Smith Date: 11/21/2022 MRN: [id number]  DOB: January 26, 1951  Patient Care Team: [name], FNP as PCP - General (Family Medicine) Jodelle Gross, NP as PCP - Cardiology (Nurse Practitioner) Ovidio Kin, MD as Consulting Physician (General Surgery) Dorothy Puffer, MD as Consulting Physician (Radiation Oncology) Lysle Pearl, MD as Consulting Physician (Pulmonary Disease) Beverely Low, MD as Consulting Physician (Orthopedic Surgery) Jene Every, MD as Consulting Physician (Orthopedic Surgery) Oneta Rack, NP as Nurse Practitioner (Adult Health Nurse Practitioner) Pershing Proud, RN as Oncology Nurse Navigator Donnelly Angelica, RN as Oncology Nurse Navigator Glenna Fellows, MD as Consulting Physician (Plastic Surgery) Rollene Rotunda, MD as Consulting Physician (Cardiology) Rachel Moulds, MD as Medical Oncologist (Hematology and Oncology)    INTERVAL HISTORY: Doris Smith is a 72 y.o. female with oncologic medical history including estrogen receptor positive breast cancer(07/2018) s/p right mastectomy. PET scan 10/06/22 showing widespread osseous metastasis. Other pertinent history includes atrial fibrillation, asthma, OSA, GERD, fatty liver, fibromyalgia, osteoporosis, chronic headaches, anxiety, depression, and bipolar disorder. Underwent ORIF of right hip 09/20/2022. Palliative ask to see for symptom and pain management and goals of care.   SOCIAL HISTORY:    Doris Smith reports that she has never smoked. She has never used smokeless tobacco. She reports that she does not drink alcohol and does not use drugs.  ADVANCE DIRECTIVES:  Advanced directives on file, Doris Smith, spouse, was identified as Management consultant should patient be unable to make decision for herself   CODE STATUS: Full code  PAST MEDICAL HISTORY: Past Medical  History:  Diagnosis Date   A-fib (HCC) 11/2012   PAF   Anxiety    takes Ativan   Asthma 04/07/2011   dx   Bipolar disorder (HCC)    Cancer (HCC)    Right breast   Depression    Early cataracts, bilateral    Fatty liver    Fibromyalgia    GERD (gastroesophageal reflux disease)    Irritable bowel syndrome    Mental disorder    dx bipolar   PONV (postoperative nausea and vomiting)    Sleep apnea 04/2011    does not wear CPAP    ALLERGIES:  is allergic to iohexol, codeine, palonosetron, prednisone, sulfonamide derivatives, celecoxib, sulfa antibiotics, and vitamin b12.  MEDICATIONS:  Current Outpatient Medications  Medication Sig Dispense Refill   anastrozole (ARIMIDEX) 1 MG tablet Take 1 tablet (1 mg total) by mouth daily. 90 tablet 4   apixaban (ELIQUIS) 5 MG TABS tablet Take 1 tablet (5 mg total) by mouth 2 (two) times daily. 180 tablet 1   clotrimazole-betamethasone (LOTRISONE) cream APPLY  CREAM TOPICALLY TWICE DAILY 45 g 0   esomeprazole (NEXIUM) 40 MG capsule Take 1 capsule (40 mg total) by mouth daily. 90 capsule 1   fluticasone (FLONASE) 50 MCG/ACT nasal spray Place 2 sprays into both nostrils daily. 16 g 6   furosemide (LASIX) 20 MG tablet Take 1 tablet (20 mg total) by mouth daily. (Patient not taking: Reported on 10/11/2022) 90 tablet 1   gabapentin (NEURONTIN) 300 MG capsule Take 1 capsule (300 mg total) by mouth 3 (three) times daily. 90 capsule 5   loratadine (CLARITIN) 10 MG tablet Take 10 mg by mouth daily.     Lurasidone HCl (LATUDA) 60 MG TABS TAKE 1 TABLET BY MOUTH ONCE DAILY 90 tablet 0   metoprolol tartrate (LOPRESSOR) 25 MG tablet  Take 1 tablet (25 mg total) by mouth 2 (two) times daily. 180 tablet 1   midodrine (PROAMATINE) 2.5 MG tablet Take 1 tablet (2.5 mg total) by mouth 3 (three) times daily with meals. 270 tablet 3   ondansetron (ZOFRAN) 8 MG tablet Take 1 tablet (8 mg total) by mouth every 8 (eight) hours as needed for nausea. 30 tablet 3   oxyCODONE  (OXYCONTIN) 10 mg 12 hr tablet Take 1 tablet (10 mg total) by mouth every 8 (eight) hours. 45 tablet 0   oxyCODONE-acetaminophen (PERCOCET) 7.5-325 MG tablet Take 1 tablet by mouth every 4 (four) hours as needed for severe pain. 60 tablet 0   pramipexole (MIRAPEX) 1 MG tablet TAKE 1 TABLET BY MOUTH THREE TIMES DAILY 90 tablet 0   prochlorperazine (COMPAZINE) 10 MG tablet Take 1 tablet (10 mg total) by mouth daily. 30 tablet 2   tizanidine (ZANAFLEX) 2 MG capsule Take 2 mg by mouth 3 (three) times daily as needed. (Patient not taking: Reported on 10/11/2022)     triamcinolone (KENALOG) 0.1 % Apply 1 application. topically 2 (two) times daily.     Vilazodone HCl (VIIBRYD) 40 MG TABS Take 1 tablet (40 mg total) by mouth daily. 90 tablet 1   No current facility-administered medications for this visit.   Facility-Administered Medications Ordered in Other Visits  Medication Dose Route Frequency Provider Last Rate Last Admin   heparin lock flush 100 unit/mL  500 Units Intravenous Once Magrinat, Valentino Hue, MD       sodium chloride flush (NS) 0.9 % injection 10 mL  10 mL Intravenous PRN Magrinat, Valentino Hue, MD        VITAL SIGNS: BP (!) 103/52 (BP Location: Left Arm, Patient Position: Sitting) Comment: provider notified  Pulse 77   Temp 98.7 F (37.1 C) (Oral)   Wt 231 lb 1.6 oz (104.8 kg)   SpO2 97%   BMI 39.67 kg/m  Filed Weights   11/21/22 1331  Weight: 231 lb 1.6 oz (104.8 kg)     Estimated body mass index is 39.67 kg/m as calculated from the following:   Height as of 10/25/22: 5\' 4"  (1.626 m).   Weight as of this encounter: 231 lb 1.6 oz (104.8 kg).   PERFORMANCE STATUS (ECOG) : 2 - Symptomatic, <50% confined to bed   Physical Exam: General: NAD, sitting in wheelchair  Cardiovascular: regular rate and rhythm Pulmonary: normal breathing pattern  Abdomen: soft, nontender, + bowel sounds Extremities: no edema, no joint deformities Neurological: AAO x4  IMPRESSION: Doris Smith  presents to clinic today for follow-up. No acute distress. Tries to be as active as possible but endorses some limitations due to fatigue and pain. Reports pain is worst with certain positions.   Sacral area observed as she is concerned about injection sites. Some redness noted however not tender to touch, no abnormalities assessed.   Neoplasm related pain Doris Smith reports her pain is slowly improving. Some days are better than others.   We discussed her regimen at length. Due to increased pain we increased her oxycodone to every 8 hours. She is tolerating well. Is taking Percocet every 4-6 hours as needed. She and husband reports she does not have to take around the clock. Able to extend time out over the past week. Gabapentin 300 mg twice daily and 600mg  at bedtime.   No changes to regimen at this time. Will continue to closely monitor.     2.   Constipation Doris Smith endorses  having a bowel movement every other day. Education provided regarding high potential for constipation in the setting of opioid use. Doris Smith has some MiraLax at home and plans to begin using this.   3.   Goals of Care Since the night of 10/30/22, Doris Smith has continued to meet goal of lying in her own bed at night beside her husband. She expresses appreciation for the care of everyone at the cancer center and is hopeful for continued improvement.  We discussed Her current illness and what it means in the larger context of her on-going co-morbidities. Natural disease trajectory and expectations were discussed. We discussed the importance of continued conversation with family and their medical providers regarding overall plan of care and treatment options, ensuring decisions are within the context of the patients values and GOCs.  PLAN: OxyContin 15 mg PO every 8 hours.  Gabapentin 300 mg every morning, 300 mg mid-day, and 600 mg at bedtime.  Percocet 7.5/325 every 4-6 hours as needed for breakthrough pain.  Not requiring  around the clock over past few days.  Miralax for constipation. Encouraged patient to bring medications to appointments as she may need to premedicate for pain prior to treatments.  Palliative will plan to see patient back in 1-2 weeks in collaboration to other oncology appointments   Patient expressed understanding and was in agreement with this plan. She also understands that she an call the clinic at any time with any questions, concerns, or complaints.   Any controlled substances utilized were prescribed in the context of palliative care. PDMP has been reviewed.    Visit consisted of counseling and education dealing with the complex and emotionally intense issues of symptom management and palliative care in the setting of serious and potentially life-threatening illness.Greater than 50%  of this time was spent counseling and coordinating care related to the above assessment and plan.  Willette Alma, AGPCNP-BC  Palliative Medicine Team/[facility]  *Please note that this is a verbal dictation therefore any spelling or grammatical errors are due to the "Dragon Medical One" system interpretation.

## 2022-11-21 NOTE — Progress Notes (Signed)
Pt. Here for Faslodex injection.  She's exactly 2 weeks too early.  Secured chatted Vikki Ports Dodd/Iruku's nurse to inform her.  States not to give and arrive pt. To palliative care.  Pt. And spouse made aware.

## 2022-11-23 ENCOUNTER — Telehealth: Payer: Self-pay | Admitting: *Deleted

## 2022-11-23 NOTE — Telephone Encounter (Signed)
This RN spoke with pt this am per her call regarding need for FMLA contact information.  She asked to relay a message to [facility]- in Palliative Care.  [name] states she feels her pain in not well controlled on the oxycontin but also that she does like using because " it makes me sleepy all the time"  She states her pain is at a 7 - and pretty much the same after using.  She does not use the percocet a lot- "doesn't seem to help and then I just get more sleepy"  She states she is able to perform ADL's ( husband assist if needed).  She did not sound to be in distress- and stated she will be taking her next dose shortly.  This note will be forwarded to provider for further recommendations.

## 2022-11-24 ENCOUNTER — Other Ambulatory Visit: Payer: Self-pay | Admitting: Nurse Practitioner

## 2022-11-24 ENCOUNTER — Telehealth: Payer: Self-pay

## 2022-11-24 DIAGNOSIS — Z9181 History of falling: Secondary | ICD-10-CM | POA: Diagnosis not present

## 2022-11-24 DIAGNOSIS — S72001S Fracture of unspecified part of neck of right femur, sequela: Secondary | ICD-10-CM | POA: Diagnosis not present

## 2022-11-24 DIAGNOSIS — Z853 Personal history of malignant neoplasm of breast: Secondary | ICD-10-CM | POA: Diagnosis not present

## 2022-11-24 DIAGNOSIS — M80051D Age-related osteoporosis with current pathological fracture, right femur, subsequent encounter for fracture with routine healing: Secondary | ICD-10-CM | POA: Diagnosis not present

## 2022-11-24 DIAGNOSIS — G629 Polyneuropathy, unspecified: Secondary | ICD-10-CM | POA: Diagnosis not present

## 2022-11-24 DIAGNOSIS — Z85830 Personal history of malignant neoplasm of bone: Secondary | ICD-10-CM | POA: Diagnosis not present

## 2022-11-24 DIAGNOSIS — G4733 Obstructive sleep apnea (adult) (pediatric): Secondary | ICD-10-CM | POA: Diagnosis not present

## 2022-11-24 DIAGNOSIS — F4323 Adjustment disorder with mixed anxiety and depressed mood: Secondary | ICD-10-CM | POA: Diagnosis not present

## 2022-11-24 DIAGNOSIS — K219 Gastro-esophageal reflux disease without esophagitis: Secondary | ICD-10-CM

## 2022-11-24 DIAGNOSIS — D649 Anemia, unspecified: Secondary | ICD-10-CM | POA: Diagnosis not present

## 2022-11-24 DIAGNOSIS — Z7901 Long term (current) use of anticoagulants: Secondary | ICD-10-CM | POA: Diagnosis not present

## 2022-11-24 DIAGNOSIS — I480 Paroxysmal atrial fibrillation: Secondary | ICD-10-CM | POA: Diagnosis not present

## 2022-11-24 DIAGNOSIS — J45909 Unspecified asthma, uncomplicated: Secondary | ICD-10-CM | POA: Diagnosis not present

## 2022-11-24 DIAGNOSIS — K589 Irritable bowel syndrome without diarrhea: Secondary | ICD-10-CM | POA: Diagnosis not present

## 2022-11-24 NOTE — Telephone Encounter (Signed)
Pt called reporting nausea with some episodes of vomiting x2 days. This RN discussed with pt medication usage for nausea, pt had reported only taking 1 anti-emetic over the last 36 hours, RN explained how to take the antiemetics and how to cycle through them, compazine increased to q6 hours. Pt verbalized understanding and repeated back to me how to take medications. RN also educated on pushing fluids, pt verbalized understanding and reported that she would call with any questions or concerns, no further needs at this point.

## 2022-11-27 ENCOUNTER — Telehealth: Payer: Self-pay

## 2022-11-27 DIAGNOSIS — G4733 Obstructive sleep apnea (adult) (pediatric): Secondary | ICD-10-CM | POA: Diagnosis not present

## 2022-11-27 DIAGNOSIS — R110 Nausea: Secondary | ICD-10-CM

## 2022-11-27 DIAGNOSIS — Z170 Estrogen receptor positive status [ER+]: Secondary | ICD-10-CM

## 2022-11-27 DIAGNOSIS — Z7901 Long term (current) use of anticoagulants: Secondary | ICD-10-CM | POA: Diagnosis not present

## 2022-11-27 DIAGNOSIS — Z85830 Personal history of malignant neoplasm of bone: Secondary | ICD-10-CM | POA: Diagnosis not present

## 2022-11-27 DIAGNOSIS — G629 Polyneuropathy, unspecified: Secondary | ICD-10-CM | POA: Diagnosis not present

## 2022-11-27 DIAGNOSIS — Z515 Encounter for palliative care: Secondary | ICD-10-CM

## 2022-11-27 DIAGNOSIS — G893 Neoplasm related pain (acute) (chronic): Secondary | ICD-10-CM

## 2022-11-27 DIAGNOSIS — Z9181 History of falling: Secondary | ICD-10-CM | POA: Diagnosis not present

## 2022-11-27 DIAGNOSIS — D649 Anemia, unspecified: Secondary | ICD-10-CM | POA: Diagnosis not present

## 2022-11-27 DIAGNOSIS — M80051D Age-related osteoporosis with current pathological fracture, right femur, subsequent encounter for fracture with routine healing: Secondary | ICD-10-CM | POA: Diagnosis not present

## 2022-11-27 DIAGNOSIS — I480 Paroxysmal atrial fibrillation: Secondary | ICD-10-CM | POA: Diagnosis not present

## 2022-11-27 DIAGNOSIS — S72001S Fracture of unspecified part of neck of right femur, sequela: Secondary | ICD-10-CM | POA: Diagnosis not present

## 2022-11-27 DIAGNOSIS — F4323 Adjustment disorder with mixed anxiety and depressed mood: Secondary | ICD-10-CM | POA: Diagnosis not present

## 2022-11-27 DIAGNOSIS — Z853 Personal history of malignant neoplasm of breast: Secondary | ICD-10-CM | POA: Diagnosis not present

## 2022-11-27 DIAGNOSIS — J45909 Unspecified asthma, uncomplicated: Secondary | ICD-10-CM | POA: Diagnosis not present

## 2022-11-27 DIAGNOSIS — K589 Irritable bowel syndrome without diarrhea: Secondary | ICD-10-CM | POA: Diagnosis not present

## 2022-11-27 MED ORDER — ONDANSETRON HCL 8 MG PO TABS
8.0000 mg | ORAL_TABLET | Freq: Three times a day (TID) | ORAL | 3 refills | Status: DC | PRN
Start: 2022-11-27 — End: 2022-12-13

## 2022-11-27 NOTE — Telephone Encounter (Signed)
Pt called this RN asking for a refill and for a check in on her FMLA paperwork. Will check with FMLA team and send in refill. Per pt nausea is much better controlled.

## 2022-11-28 ENCOUNTER — Encounter: Payer: Self-pay | Admitting: Hematology and Oncology

## 2022-11-28 DIAGNOSIS — D649 Anemia, unspecified: Secondary | ICD-10-CM | POA: Diagnosis not present

## 2022-11-28 DIAGNOSIS — G4733 Obstructive sleep apnea (adult) (pediatric): Secondary | ICD-10-CM | POA: Diagnosis not present

## 2022-11-28 DIAGNOSIS — F4323 Adjustment disorder with mixed anxiety and depressed mood: Secondary | ICD-10-CM | POA: Diagnosis not present

## 2022-11-28 DIAGNOSIS — M80051D Age-related osteoporosis with current pathological fracture, right femur, subsequent encounter for fracture with routine healing: Secondary | ICD-10-CM | POA: Diagnosis not present

## 2022-11-28 DIAGNOSIS — Z85830 Personal history of malignant neoplasm of bone: Secondary | ICD-10-CM | POA: Diagnosis not present

## 2022-11-28 DIAGNOSIS — Z9181 History of falling: Secondary | ICD-10-CM | POA: Diagnosis not present

## 2022-11-28 DIAGNOSIS — J45909 Unspecified asthma, uncomplicated: Secondary | ICD-10-CM | POA: Diagnosis not present

## 2022-11-28 DIAGNOSIS — I480 Paroxysmal atrial fibrillation: Secondary | ICD-10-CM | POA: Diagnosis not present

## 2022-11-28 DIAGNOSIS — K589 Irritable bowel syndrome without diarrhea: Secondary | ICD-10-CM | POA: Diagnosis not present

## 2022-11-28 DIAGNOSIS — Z853 Personal history of malignant neoplasm of breast: Secondary | ICD-10-CM | POA: Diagnosis not present

## 2022-11-28 DIAGNOSIS — S72001S Fracture of unspecified part of neck of right femur, sequela: Secondary | ICD-10-CM | POA: Diagnosis not present

## 2022-11-28 DIAGNOSIS — Z7901 Long term (current) use of anticoagulants: Secondary | ICD-10-CM | POA: Diagnosis not present

## 2022-11-28 DIAGNOSIS — G629 Polyneuropathy, unspecified: Secondary | ICD-10-CM | POA: Diagnosis not present

## 2022-11-28 NOTE — Progress Notes (Signed)
Palliative Medicine [facility]  Telephone:(336) [phone] Fax:(336) [phone]   Name: Doris Smith Date: 11/28/2022 MRN: [id number]  DOB: 07-09-1951  Patient Care Team: [name], FNP as PCP - General (Family Medicine) Jodelle Gross, NP as PCP - Cardiology (Nurse Practitioner) Ovidio Kin, MD as Consulting Physician (General Surgery) Dorothy Puffer, MD as Consulting Physician (Radiation Oncology) Lysle Pearl, MD as Consulting Physician (Pulmonary Disease) Beverely Low, MD as Consulting Physician (Orthopedic Surgery) Jene Every, MD as Consulting Physician (Orthopedic Surgery) Oneta Rack, NP as Nurse Practitioner (Adult Health Nurse Practitioner) Pershing Proud, RN as Oncology Nurse Navigator Donnelly Angelica, RN as Oncology Nurse Navigator Glenna Fellows, MD as Consulting Physician (Plastic Surgery) Rollene Rotunda, MD as Consulting Physician (Cardiology) Rachel Moulds, MD as Medical Oncologist (Hematology and Oncology)    INTERVAL HISTORY: Doris Smith is a 72 y.o. female with oncologic medical history including estrogen receptor positive breast cancer(07/2018) s/p right mastectomy. PET scan 10/06/22 showing widespread osseous metastasis. Other pertinent history includes atrial fibrillation, asthma, OSA, GERD, fatty liver, fibromyalgia, osteoporosis, chronic headaches, anxiety, depression, and bipolar disorder. Underwent ORIF of right hip 09/20/2022. Palliative ask to see for symptom and pain management and goals of care.   SOCIAL HISTORY:    Doris Smith reports that she has never smoked. She has never used smokeless tobacco. She reports that she does not drink alcohol and does not use drugs.  ADVANCE DIRECTIVES:  Advanced directives on file, Doris Smith, spouse, was identified as Management consultant should patient be unable to make decision for herself   CODE STATUS: Full code  PAST MEDICAL HISTORY: Past Medical  History:  Diagnosis Date   A-fib (HCC) 11/2012   PAF   Anxiety    takes Ativan   Asthma 04/07/2011   dx   Bipolar disorder (HCC)    Cancer (HCC)    Right breast   Depression    Early cataracts, bilateral    Fatty liver    Fibromyalgia    GERD (gastroesophageal reflux disease)    Irritable bowel syndrome    Mental disorder    dx bipolar   PONV (postoperative nausea and vomiting)    Sleep apnea 04/2011    does not wear CPAP    ALLERGIES:  is allergic to iohexol, codeine, palonosetron, prednisone, sulfonamide derivatives, celecoxib, sulfa antibiotics, and vitamin b12.  MEDICATIONS:  Current Outpatient Medications  Medication Sig Dispense Refill   anastrozole (ARIMIDEX) 1 MG tablet Take 1 tablet (1 mg total) by mouth daily. 90 tablet 4   apixaban (ELIQUIS) 5 MG TABS tablet Take 1 tablet (5 mg total) by mouth 2 (two) times daily. 180 tablet 1   clotrimazole-betamethasone (LOTRISONE) cream APPLY  CREAM TOPICALLY TWICE DAILY 45 g 0   esomeprazole (NEXIUM) 40 MG capsule Take 1 capsule by mouth once daily 90 capsule 0   fluticasone (FLONASE) 50 MCG/ACT nasal spray Place 2 sprays into both nostrils daily. 16 g 6   furosemide (LASIX) 20 MG tablet Take 1 tablet (20 mg total) by mouth daily. (Patient not taking: Reported on 10/11/2022) 90 tablet 1   gabapentin (NEURONTIN) 300 MG capsule Take 1 capsule (300 mg total) by mouth 3 (three) times daily. 90 capsule 5   loratadine (CLARITIN) 10 MG tablet Take 10 mg by mouth daily.     Lurasidone HCl (LATUDA) 60 MG TABS TAKE 1 TABLET BY MOUTH ONCE DAILY 90 tablet 0   metoprolol tartrate (LOPRESSOR) 25 MG tablet Take 1  tablet (25 mg total) by mouth 2 (two) times daily. 180 tablet 1   midodrine (PROAMATINE) 2.5 MG tablet Take 1 tablet (2.5 mg total) by mouth 3 (three) times daily with meals. 270 tablet 3   ondansetron (ZOFRAN) 8 MG tablet Take 1 tablet (8 mg total) by mouth every 8 (eight) hours as needed for nausea. 30 tablet 3   oxyCODONE  (OXYCONTIN) 10 mg 12 hr tablet Take 1 tablet (10 mg total) by mouth every 8 (eight) hours. 45 tablet 0   oxyCODONE-acetaminophen (PERCOCET) 7.5-325 MG tablet Take 1 tablet by mouth every 4 (four) hours as needed for severe pain. 60 tablet 0   pramipexole (MIRAPEX) 1 MG tablet TAKE 1 TABLET BY MOUTH THREE TIMES DAILY 90 tablet 0   prochlorperazine (COMPAZINE) 10 MG tablet Take 1 tablet (10 mg total) by mouth daily. 30 tablet 2   tizanidine (ZANAFLEX) 2 MG capsule Take 2 mg by mouth 3 (three) times daily as needed. (Patient not taking: Reported on 10/11/2022)     triamcinolone (KENALOG) 0.1 % Apply 1 application. topically 2 (two) times daily.     Vilazodone HCl (VIIBRYD) 40 MG TABS Take 1 tablet (40 mg total) by mouth daily. 90 tablet 1   No current facility-administered medications for this visit.   Facility-Administered Medications Ordered in Other Visits  Medication Dose Route Frequency Provider Last Rate Last Admin   heparin lock flush 100 unit/mL  500 Units Intravenous Once Magrinat, Valentino Hue, MD       sodium chloride flush (NS) 0.9 % injection 10 mL  10 mL Intravenous PRN Magrinat, Valentino Hue, MD        VITAL SIGNS: There were no vitals taken for this visit. There were no vitals filed for this visit.    Estimated body mass index is 39.67 kg/m as calculated from the following:   Height as of 10/25/22: 5\' 4"  (1.626 m).   Weight as of 11/21/22: 231 lb 1.6 oz (104.8 kg).   PERFORMANCE STATUS (ECOG) : 2 - Symptomatic, <50% confined to bed   Physical Exam: General: NAD, sitting in wheelchair  Cardiovascular: regular rate and rhythm Pulmonary: normal breathing pattern  Abdomen: soft, nontender, + bowel sounds Extremities: trace lower extremity edema, no joint deformities Neurological: AAO x4  IMPRESSION: Doris Smith presents to clinic today for symptom management follow-up. No acute distress. Her husband and sister is present. Appetite is good. Some concerns with nausea over the past  several days.    Neoplasm related pain Doris Smith reports her pain is slowly improving. Some days are better than others.   We discussed her regimen at length. Due to increased pain we increased her oxycodone to every 8 hours however she did not make adjustments. She and husband states they feel she is doing well with the every 12 hour regimen now. Advised to continue with every 12 versus every 8 hours. Is taking Percocet every 4-6 hours as needed. Is not requiring around the clock. States when taking makes her sleepy at times. Gabapentin 300 mg twice daily and 600mg  at bedtime. Complains of hip and lower back pain. Education provided on use of lidocaine patches for additional relief. She has used in the past.   Some occasional nausea however this is controlled with medications.    Will continue to closely monitor.    2.   Constipation Controlled on regimen.    3.   Goals of Care Mrs. Pfohl is emotional. Her husband becomes emotional as patient shares  she knows her health is not the best and her cancer is not curable. She is taking life one day at time. Speaks to when her time comes she will be ready but hopeful it is not now. She knows that no one has a definitive time frame. Expresses her appreciation in all of the care and support. Expresses her goal is to continue to treat the treatable allowing her the ability to continue to thrive while not suffering.   4/30 ;Since the night of 10/30/22, Mrs. Kunze has continued to meet goal of lying in her own bed at night beside her husband. She expresses appreciation for the care of everyone at the cancer center and is hopeful for continued improvement.  We discussed Her current illness and what it means in the larger context of her on-going co-morbidities. Natural disease trajectory and expectations were discussed. We discussed the importance of continued conversation with family and their medical providers regarding overall plan of care and treatment  options, ensuring decisions are within the context of the patients values and GOCs.  PLAN: OxyContin 15 mg PO every 12 hours.  Gabapentin 300 mg every morning, 300 mg mid-day, and 600 mg at bedtime.  Lidocaine patches to back and hip Prescription provided for briefs.  Percocet 7.5/325 every 4-6 hours as needed for breakthrough pain.  Not requiring around the clock over past few days.  Miralax for constipation. Encouraged patient to bring medications to appointments as she may need to premedicate for pain prior to treatments.  Palliative will plan to see patient back in 1-2 weeks in collaboration to other oncology appointments   Patient expressed understanding and was in agreement with this plan. She also understands that she an call the clinic at any time with any questions, concerns, or complaints.   Any controlled substances utilized were prescribed in the context of palliative care. PDMP has been reviewed.    Visit consisted of counseling and education dealing with the complex and emotionally intense issues of symptom management and palliative care in the setting of serious and potentially life-threatening illness.Greater than 50%  of this time was spent counseling and coordinating care related to the above assessment and plan.  Willette Alma, AGPCNP-BC  Palliative Medicine Team/[facility]  *Please note that this is a verbal dictation therefore any spelling or grammatical errors are due to the "Dragon Medical One" system interpretation.

## 2022-11-28 NOTE — Progress Notes (Signed)
  Radiation Oncology         [phone]) [phone] ________________________________  Name: Doris Smith MRN: [id number]  Date: 11/03/2022  DOB: May 11, 1951  End of Treatment Note  Diagnosis:    Recurrent metastatic Stage IB, [id number], grade 2 ,ER/PR positive invasive lobular carcinoma of the right breast.      Indication for treatment: palliative       Radiation treatment dates:   10/23/22-11/03/22  Site/planned dose:   left chest wall, lower T spine, and right proximal hip/pelvis were each treated to 30 GY in 10 fractions.  Narrative: The patient tolerated radiation. She developed fatigue during therapy.  Plan: The patient will receive a call in about one month from the radiation oncology department. She will continue follow up with Dr. Al Pimple as well.     Osker Mason, PAC

## 2022-11-29 ENCOUNTER — Telehealth: Payer: Self-pay | Admitting: *Deleted

## 2022-11-29 ENCOUNTER — Encounter: Payer: Self-pay | Admitting: *Deleted

## 2022-11-29 NOTE — Telephone Encounter (Signed)
This nurse out of office effective end of 11/21/2022 workday through late morning today received multiple messages requesting status of FMLA for Doris Smith spouse [name].  No form received thus far.  A patient message says they are running out of time.  Unable to reach patient or spouse.  Voicemail included above information with advice to bring form tomorrow, sign ROI and request extension to file for 7 - 10 business day processing timeframe.

## 2022-11-30 ENCOUNTER — Inpatient Hospital Stay: Payer: BC Managed Care – PPO

## 2022-11-30 ENCOUNTER — Other Ambulatory Visit: Payer: Self-pay

## 2022-11-30 ENCOUNTER — Encounter: Payer: Self-pay | Admitting: Nurse Practitioner

## 2022-11-30 ENCOUNTER — Telehealth: Payer: Self-pay

## 2022-11-30 ENCOUNTER — Other Ambulatory Visit (HOSPITAL_COMMUNITY): Payer: Self-pay

## 2022-11-30 ENCOUNTER — Inpatient Hospital Stay (HOSPITAL_BASED_OUTPATIENT_CLINIC_OR_DEPARTMENT_OTHER): Payer: BC Managed Care – PPO | Admitting: Nurse Practitioner

## 2022-11-30 ENCOUNTER — Telehealth: Payer: Self-pay | Admitting: Pharmacy Technician

## 2022-11-30 ENCOUNTER — Inpatient Hospital Stay (HOSPITAL_BASED_OUTPATIENT_CLINIC_OR_DEPARTMENT_OTHER): Payer: BC Managed Care – PPO | Admitting: Hematology and Oncology

## 2022-11-30 ENCOUNTER — Encounter: Payer: Self-pay | Admitting: Hematology and Oncology

## 2022-11-30 VITALS — BP 136/67 | HR 138 | Temp 97.5°F | Resp 18 | Wt 226.0 lb

## 2022-11-30 DIAGNOSIS — Z170 Estrogen receptor positive status [ER+]: Secondary | ICD-10-CM

## 2022-11-30 DIAGNOSIS — B370 Candidal stomatitis: Secondary | ICD-10-CM

## 2022-11-30 DIAGNOSIS — K5903 Drug induced constipation: Secondary | ICD-10-CM | POA: Diagnosis not present

## 2022-11-30 DIAGNOSIS — R110 Nausea: Secondary | ICD-10-CM | POA: Diagnosis not present

## 2022-11-30 DIAGNOSIS — Z79811 Long term (current) use of aromatase inhibitors: Secondary | ICD-10-CM | POA: Diagnosis not present

## 2022-11-30 DIAGNOSIS — C7951 Secondary malignant neoplasm of bone: Secondary | ICD-10-CM | POA: Diagnosis not present

## 2022-11-30 DIAGNOSIS — C50411 Malignant neoplasm of upper-outer quadrant of right female breast: Secondary | ICD-10-CM

## 2022-11-30 DIAGNOSIS — C50811 Malignant neoplasm of overlapping sites of right female breast: Secondary | ICD-10-CM | POA: Diagnosis not present

## 2022-11-30 DIAGNOSIS — Z79899 Other long term (current) drug therapy: Secondary | ICD-10-CM | POA: Diagnosis not present

## 2022-11-30 DIAGNOSIS — G893 Neoplasm related pain (acute) (chronic): Secondary | ICD-10-CM

## 2022-11-30 DIAGNOSIS — Z515 Encounter for palliative care: Secondary | ICD-10-CM | POA: Diagnosis not present

## 2022-11-30 DIAGNOSIS — Z9011 Acquired absence of right breast and nipple: Secondary | ICD-10-CM | POA: Diagnosis not present

## 2022-11-30 DIAGNOSIS — Z7189 Other specified counseling: Secondary | ICD-10-CM

## 2022-11-30 DIAGNOSIS — R530 Neoplastic (malignant) related fatigue: Secondary | ICD-10-CM

## 2022-11-30 DIAGNOSIS — Z923 Personal history of irradiation: Secondary | ICD-10-CM | POA: Diagnosis not present

## 2022-11-30 LAB — COMPREHENSIVE METABOLIC PANEL
ALT: 13 U/L (ref 0–44)
AST: 23 U/L (ref 15–41)
Albumin: 3.5 g/dL (ref 3.5–5.0)
Alkaline Phosphatase: 232 U/L — ABNORMAL HIGH (ref 38–126)
Anion gap: 10 (ref 5–15)
BUN: 6 mg/dL — ABNORMAL LOW (ref 8–23)
CO2: 28 mmol/L (ref 22–32)
Calcium: 8.7 mg/dL — ABNORMAL LOW (ref 8.9–10.3)
Chloride: 103 mmol/L (ref 98–111)
Creatinine, Ser: 0.8 mg/dL (ref 0.44–1.00)
GFR, Estimated: >60 mL/min (ref >60)
Glucose, Bld: 168 mg/dL — ABNORMAL HIGH (ref 70–99)
Potassium: 3.4 mmol/L — ABNORMAL LOW (ref 3.5–5.1)
Sodium: 141 mmol/L (ref 135–145)
Total Bilirubin: 0.7 mg/dL (ref 0.3–1.2)
Total Protein: 6.3 g/dL — ABNORMAL LOW (ref 6.5–8.1)

## 2022-11-30 LAB — CBC WITH DIFFERENTIAL/PLATELET
Abs Immature Granulocytes: 0.09 10*3/uL — ABNORMAL HIGH (ref 0.00–0.07)
Basophils Absolute: 0 10*3/uL (ref 0.0–0.1)
Basophils Relative: 1 %
Eosinophils Absolute: 0.1 10*3/uL (ref 0.0–0.5)
Eosinophils Relative: 1 %
HCT: 28.2 % — ABNORMAL LOW (ref 36.0–46.0)
Hemoglobin: 9.1 g/dL — ABNORMAL LOW (ref 12.0–15.0)
Immature Granulocytes: 2 %
Lymphocytes Relative: 7 %
Lymphs Abs: 0.4 10*3/uL — ABNORMAL LOW (ref 0.7–4.0)
MCH: 31.1 pg (ref 26.0–34.0)
MCHC: 32.3 g/dL (ref 30.0–36.0)
MCV: 96.2 fL (ref 80.0–100.0)
Monocytes Absolute: 0.6 10*3/uL (ref 0.1–1.0)
Monocytes Relative: 10 %
Neutro Abs: 4.4 10*3/uL (ref 1.7–7.7)
Neutrophils Relative %: 79 %
Platelets: 163 10*3/uL (ref 150–400)
RBC: 2.93 MIL/uL — ABNORMAL LOW (ref 3.87–5.11)
RDW: 18.7 % — ABNORMAL HIGH (ref 11.5–15.5)
WBC: 5.5 10*3/uL (ref 4.0–10.5)
nRBC: 3.5 % — ABNORMAL HIGH (ref 0.0–0.2)

## 2022-11-30 MED ORDER — PALBOCICLIB 100 MG PO CAPS
100.0000 mg | ORAL_CAPSULE | Freq: Every day | ORAL | 3 refills | Status: DC
  Filled 2022-11-30: qty 21, 21d supply, fill #0

## 2022-11-30 MED ORDER — LIDOCAINE 5 % EX PTCH: 2.0000 | MEDICATED_PATCH | CUTANEOUS | 2 refills | Status: DC

## 2022-11-30 MED ORDER — CERTAINTY FITTED BRIEFS XL MISC: 1.0000 | 3 refills | Status: DC | PRN

## 2022-11-30 MED ORDER — OXYCODONE-ACETAMINOPHEN 7.5-325 MG PO TABS
1.0000 | ORAL_TABLET | ORAL | 0 refills | Status: DC | PRN
Start: 2022-11-30 — End: 2022-12-06

## 2022-11-30 MED ORDER — NYSTATIN 100000 UNIT/ML MT SUSP
5.0000 mL | Freq: Four times a day (QID) | ORAL | 1 refills | Status: DC | PRN
Start: 2022-11-30 — End: 2023-08-16

## 2022-11-30 MED ORDER — OXYCODONE HCL ER 10 MG PO T12A
10.0000 mg | EXTENDED_RELEASE_TABLET | Freq: Two times a day (BID) | ORAL | 0 refills | Status: DC
Start: 2022-11-30 — End: 2022-12-06

## 2022-11-30 NOTE — Progress Notes (Signed)
[facility]  Telephone:([phone]) [phone] [phone]    ID: Doris Smith DOB: 13-Jan-1951  MR#: [id number]  JYN#:[id number]  Patient Care Team: Doris Pierini, FNP as PCP - General (Family Medicine) Doris Gross, NP as PCP - Cardiology (Nurse Practitioner) Doris Kin, MD as Consulting Physician (General Surgery) Doris Puffer, MD as Consulting Physician (Radiation Oncology) Doris Pearl, MD as Consulting Physician (Pulmonary Disease) Doris Low, MD as Consulting Physician (Orthopedic Surgery) Doris Every, MD as Consulting Physician (Orthopedic Surgery) Doris Rack, NP as Nurse Practitioner (Adult Health Nurse Practitioner) Doris Proud, RN as Oncology Nurse Navigator Donnelly Angelica, RN as Oncology Nurse Navigator Glenna Fellows, MD as Consulting Physician (Plastic Surgery) Rollene Rotunda, MD as Consulting Physician (Cardiology) Rachel Moulds, MD as Medical Oncologist (Hematology and Oncology) Rachel Moulds, MD OTHER MD: Doris Smith, psychiatry   CHIEF COMPLAINT: Estrogen receptor positive breast cancer (s/p right mastectomy)  CURRENT TREATMENT: Faslodex INTERVAL HISTORY:  Doris Smith returns today for follow-up of her estrogen receptor positive breast cancer.  She is here for follow up with her husband and her sister. Her sister had a set of questions written and wanted to address all of these. She will again is extremely anxious, tearful, states she is in a lot of pain, she has nausea, cannot eat much, the saliva pulls in her mouth.  She is working with palliative care for pain management. Rest of the pertinent 10 point ROS reviewed and negative   HISTORY OF CURRENT ILLNESS: From the original intake note:  "Doris Smith" presented to her PCP, Doris Smith, with right breast pain, fullness, and nipple inversion since November 2019. She proceeded to undergo bilateral diagnostic mammography with tomography and right breast  ultrasonography at [facility] on 07/31/2018 showing: findings compatible with multicentric breast cancer spanning at least the upper inner and upper outer quadrants of the right breast; normal right axilla. Palpable firmness of approximately 1.5 cm was noted in the 9 o'clock position, which was also seen on ultrasound with indisctinct margins measuring 2.7 x 2.3 x 2.2 cm. At 1 o'clock, a mass with poorly defined margins measures 1.9 x 1 x 0.8 cm. Additional poorly defined masses were seen in the 12 o'clock retroareolar region.  Accordingly on 08/02/2018 she proceeded to biopsy of the right breast area in question. The pathology ([id number]) from this procedure showed: invasive mammary carcinoma at 9 o'clock and 1 o'clock; e-cadherin negative, consistent with lobular phenotype; grade 2-3. Prognostic indicators significant for: estrogen receptor, 90% positive and progesterone receptor, 1% positive, both with strong staining intensity. Proliferation marker Ki67 at 1%. HER2 equivocal by immunohistochemistry, 2+ but negative by fluorescent in situ hybridization with a signals ratio 1.04 and number per cell 1.2.   The patient's subsequent history is as detailed below.   PAST MEDICAL HISTORY: Past Medical History:  Diagnosis Date   A-fib (HCC) 11/2012   PAF   Anxiety    takes Ativan   Asthma 04/07/2011   dx   Bipolar disorder (HCC)    Cancer (HCC)    Right breast   Depression    Early cataracts, bilateral    Fatty liver    Fibromyalgia    GERD (gastroesophageal reflux disease)    Irritable bowel syndrome    Mental disorder    dx bipolar   PONV (postoperative nausea and vomiting)    Sleep apnea 04/2011    does not wear CPAP     PAST SURGICAL HISTORY: Past Surgical History:  Procedure Laterality Date  ABDOMINAL HYSTERECTOMY     COLONOSCOPY     DILATION AND CURETTAGE OF UTERUS  1981   abnormal pap   KNEE ARTHROSCOPY Left 2007   x 2   LUMBAR LAMINECTOMY/DECOMPRESSION  MICRODISCECTOMY  05/31/2011   Procedure: LUMBAR LAMINECTOMY/DECOMPRESSION MICRODISCECTOMY;  Surgeon: Javier Docker;  Location: [facility];  Service: Orthopedics;  Laterality: Left;  Decompression Lumbar four to five and  Lumbar five to Sacral one on Left  (X-Ray)   MASTECTOMY W/ SENTINEL NODE BIOPSY Right 09/16/2018   Procedure: RIGHT MASTECTOMY WITH RIGHT AXILLARY SENTINEL LYMPH NODE BIOPSY;  Surgeon: Doris Kin, MD;  Location: [facility] OR;  Service: General;  Laterality: Right;   PARTIAL HYSTERECTOMY  1982   PORTACATH PLACEMENT Left 10/31/2018   Procedure: INSERTION PORT-A-CATH WITH ULTRASOUND;  Surgeon: Doris Kin, MD;  Location: [facility];  Service: General;  Laterality: Left;   TONSILLECTOMY     as child   TOTAL KNEE ARTHROPLASTY Left 12/18/2005    FAMILY HISTORY Family History  Problem Relation Age of Onset   Anesthesia problems Mother    Heart disease Mother        CHF, atrial fib   Heart failure Mother    Anesthesia problems Sister    Colon polyps Father    Heart disease Father        "Fluid around the heart"   Hypertension Sister    Breast cancer Maternal Aunt    Colon cancer Maternal Aunt    Prostate cancer Neg Hx    Ovarian cancer Neg Hx   As of February 2019, patient father is alive at 67 years old. Patient mother died at age 9 in 05-16-20. She notes a family hx of breast cancer. A maternal aunt was diagnosed with breast cancer, but Doris Smith is unsure if it was in one or both breasts. She has 3 siblings, 3 sisters and 0 brothers.   GYNECOLOGIC HISTORY:  No LMP recorded. Patient is postmenopausal. Menarche: 72 years old Age at first live birth: 72 years old GX P 2 LMP about 42 years ago Contraceptive no HRT no  Hysterectomy? partial So? no   SOCIAL HISTORY:  Doris Smith worked in [employer] for 15 years. She is on disability because her back, knees, and nerves. Her husband, Doris Smith, has been at [employer] 43 years as a Estate agent.  Even though their  home is in [place] they are actually living in [place] in an apartment while her husband works there.  Son Doris Needle, age 47, works as a Surveyor, minerals in [place], [place]. Son Onalee Hua, age 68, works as a Psychologist, occupational in [place], [place]. They have 4 grandchildren, 2 great-grandchildren with 2 more on the way. She attends a [employer].   ADVANCED DIRECTIVES: In the absence of any documents to the contrary the patient's husband is her healthcare power of attorney   HEALTH MAINTENANCE: Social History   Tobacco Use   Smoking status: Never   Smokeless tobacco: Never  Vaping Use   Vaping Use: Never used  Substance Use Topics   Alcohol use: No   Drug use: No     Colonoscopy: 2019  PAP: 11/03/2014, normal  Bone density: 10/13/2016, T-score of -2.1   Allergies  Allergen Reactions   Iohexol     "over 20 years ago" had reaction "hurting in arm and heart"-Done in [place], [place].Marland Kitchenphysician stopped CT at that time.   Codeine Other (See Comments)    Makes feel like she is wild    Palonosetron Other (  See Comments)    headache   Prednisone Other (See Comments)    Makes me very irritable   Sulfonamide Derivatives Other (See Comments)    Upset stomach   Celecoxib Nausea And Vomiting   Sulfa Antibiotics Nausea And Vomiting   Vitamin B12 Rash    Current Outpatient Medications  Medication Sig Dispense Refill   palbociclib (IBRANCE) 100 MG capsule Take 1 capsule (100 mg total) by mouth daily with breakfast. Take whole with food. Take for 21 days on, 7 days off, repeat Smith 28 days. 21 capsule 3   anastrozole (ARIMIDEX) 1 MG tablet Take 1 tablet (1 mg total) by mouth daily. 90 tablet 4   apixaban (ELIQUIS) 5 MG TABS tablet Take 1 tablet (5 mg total) by mouth 2 (two) times daily. 180 tablet 1   clotrimazole-betamethasone (LOTRISONE) cream APPLY  CREAM TOPICALLY TWICE DAILY 45 g 0   esomeprazole (NEXIUM) 40 MG capsule Take 1 capsule by mouth once daily 90 capsule 0   fluticasone (FLONASE) 50 MCG/ACT nasal spray Place 2  sprays into both nostrils daily. 16 g 6   furosemide (LASIX) 20 MG tablet Take 1 tablet (20 mg total) by mouth daily. (Patient not taking: Reported on 10/11/2022) 90 tablet 1   gabapentin (NEURONTIN) 300 MG capsule Take 1 capsule (300 mg total) by mouth 3 (three) times daily. 90 capsule 5   Incontinence Supply Disposable (CERTAINTY FITTED BRIEFS XL) MISC 1 Package by Does not apply route as needed. 56 each 3   lidocaine (LIDODERM) 5 % Place 2 patches onto the skin daily. Remove & Discard patch within 12 hours or as directed by MD 60 patch 2   loratadine (CLARITIN) 10 MG tablet Take 10 mg by mouth daily.     Lurasidone HCl (LATUDA) 60 MG TABS TAKE 1 TABLET BY MOUTH ONCE DAILY 90 tablet 0   magic mouthwash (nystatin, diphenhydrAMINE, alum & mag hydroxide) suspension mixture Swish and swallow 5 mLs 4 (four) times daily as needed for mouth pain. 240 mL 1   metoprolol tartrate (LOPRESSOR) 25 MG tablet Take 1 tablet (25 mg total) by mouth 2 (two) times daily. 180 tablet 1   midodrine (PROAMATINE) 2.5 MG tablet Take 1 tablet (2.5 mg total) by mouth 3 (three) times daily with meals. 270 tablet 3   ondansetron (ZOFRAN) 8 MG tablet Take 1 tablet (8 mg total) by mouth Smith 8 (eight) hours as needed for nausea. 30 tablet 3   oxyCODONE (OXYCONTIN) 10 mg 12 hr tablet Take 1 tablet (10 mg total) by mouth Smith 12 (twelve) hours. 60 tablet 0   oxyCODONE-acetaminophen (PERCOCET) 7.5-325 MG tablet Take 1 tablet by mouth Smith 4 (four) hours as needed for severe pain. 60 tablet 0   pramipexole (MIRAPEX) 1 MG tablet TAKE 1 TABLET BY MOUTH THREE TIMES DAILY 90 tablet 0   prochlorperazine (COMPAZINE) 10 MG tablet Take 1 tablet (10 mg total) by mouth daily. 30 tablet 2   tizanidine (ZANAFLEX) 2 MG capsule Take 2 mg by mouth 3 (three) times daily as needed. (Patient not taking: Reported on 10/11/2022)     triamcinolone (KENALOG) 0.1 % Apply 1 application. topically 2 (two) times daily.     Vilazodone HCl (VIIBRYD) 40 MG TABS  Take 1 tablet (40 mg total) by mouth daily. 90 tablet 1   No current facility-administered medications for this visit.   Facility-Administered Medications Ordered in Other Visits  Medication Dose Route Frequency Provider Last Rate Last Admin   heparin lock  flush 100 unit/mL  500 Units Intravenous Once Magrinat, Valentino Hue, MD       sodium chloride flush (NS) 0.9 % injection 10 mL  10 mL Intravenous PRN Magrinat, Valentino Hue, MD        OBJECTIVE: white woman who appears stated age  Vitals:   11/30/22 1435  BP: 136/67  Pulse: (!) 138  Resp: 18  Temp: (!) 97.5 F (36.4 C)  SpO2: 99%   Wt Readings from Last 3 Encounters:  11/30/22 226 lb (102.5 kg)  11/21/22 231 lb 1.6 oz (104.8 kg)  11/07/22 225 lb 14.4 oz (102.5 kg)   Body mass index is 38.79 kg/m.    ECOG FS:2 - Symptomatic, <50% confined to bed  She was extremely anxious and tearful today however appears in no acute pain distress.  She is in a wheelchair, husband and sister by her side.  Rest of physical exam deferred in lieu of counseling    LAB RESULTS:  CMP     Component Value Date/Time   NA 141 11/30/2022 1339   NA 145 (H) 06/05/2022 1024   K 3.4 (L) 11/30/2022 1339   CL 103 11/30/2022 1339   CO2 28 11/30/2022 1339   GLUCOSE 168 (H) 11/30/2022 1339   BUN 6 (L) 11/30/2022 1339   BUN 17 06/05/2022 1024   CREATININE 0.80 11/30/2022 1339   CREATININE 1.07 (H) 08/25/2022 0921   CALCIUM 8.7 (L) 11/30/2022 1339   PROT 6.3 (L) 11/30/2022 1339   PROT 6.6 06/05/2022 1024   ALBUMIN 3.5 11/30/2022 1339   ALBUMIN 4.0 06/05/2022 1024   AST 23 11/30/2022 1339   AST 22 08/25/2022 0921   ALT 13 11/30/2022 1339   ALT 26 08/25/2022 0921   ALKPHOS 232 (H) 11/30/2022 1339   BILITOT 0.7 11/30/2022 1339   BILITOT 0.5 08/25/2022 0921   GFRNONAA >60 11/30/2022 1339   GFRNONAA 56 (L) 08/25/2022 0921   GFRAA 81 02/03/2020 1049   GFRAA >60 12/19/2018 1434    No results found for: "TOTALPROTELP", "ALBUMINELP", "A1GS", "A2GS",  "BETS", "BETA2SER", "GAMS", "MSPIKE", "SPEI"  No results found for: "KPAFRELGTCHN", "LAMBDASER", "KAPLAMBRATIO"  Lab Results  Component Value Date   WBC 5.5 11/30/2022   NEUTROABS 4.4 11/30/2022   HGB 9.1 (L) 11/30/2022   HCT 28.2 (L) 11/30/2022   MCV 96.2 11/30/2022   PLT 163 11/30/2022   No results found for: "LABCA2"  No components found for: "YNWGNF621"  No results for input(s): "INR" in the last 168 hours.  No results found for: "LABCA2"  No results found for: "CAN199"  No results found for: "CAN125"  Lab Results  Component Value Date   CAN153 656.0 (H) 08/25/2022    Lab Results  Component Value Date   CA2729 2,169.4 (H) 08/25/2022    No components found for: "HGQUANT"  No results found for: "CEA1", "CEA" / No results found for: "CEA1", "CEA"   No results found for: "AFPTUMOR"  No results found for: "CHROMOGRNA"  No results found for: "HGBA", "HGBA2QUANT", "HGBFQUANT", "HGBSQUAN" (Hemoglobinopathy evaluation)   No results found for: "LDH"  No results found for: "IRON", "TIBC", "IRONPCTSAT" (Iron and TIBC)  No results found for: "FERRITIN"  Urinalysis    Component Value Date/Time   COLORURINE YELLOW 12/19/2018 1355   APPEARANCEUR Clear 02/03/2020 1045   LABSPEC 1.016 12/19/2018 1355   PHURINE 5.0 12/19/2018 1355   GLUCOSEU Negative 02/03/2020 1045   HGBUR NEGATIVE 12/19/2018 1355   BILIRUBINUR Negative 02/03/2020 1045   KETONESUR NEGATIVE 12/19/2018  1355   PROTEINUR Negative 02/03/2020 1045   PROTEINUR NEGATIVE 12/19/2018 1355   UROBILINOGEN negative 11/03/2014 1039   UROBILINOGEN 0.2 05/25/2011 0923   NITRITE Negative 02/03/2020 1045   NITRITE NEGATIVE 12/19/2018 1355   LEUKOCYTESUR Negative 02/03/2020 1045   LEUKOCYTESUR SMALL (A) 12/19/2018 1355    STUDIES: No results found.    ELIGIBLE FOR AVAILABLE RESEARCH PROTOCOL: no   ASSESSMENT: 71 y.o. [place], [place] woman status post right breast biopsy 08/02/2018 for a clinical T3 N0,  stage IIA invasive lobular carcinoma, grade 2, estrogen receptor strongly positive, progesterone receptor 1% positive, with no HER-2 amplification and an MIB-1 of 1%.  (a) CT scan of the head and chest, without contrast 08/23/2018 showed nonspecific 0.4 cm left lower lobe pulmonary nodule, no definitive metastatic disease  (b) bone scan 08/23/2018 shows multiple spinal areas of abnormal uptake, but  (c) total spinal MRI 09/03/2018 finds bone scan findings to be secondary to degenerative disease, no evidence of metastatic disease.  (1) status post right mastectomy and sentinel lymph node sampling 09/16/2018 showing a pT3 pN1(mic), stage IIA-IIIA invasive lobular breast cancer, grade 2, with 1 of 5 sampled lymph nodes involved by micrometastatic deposit, with extra nodular extension; ample margins  (2)  MammaPrint "high risk" suggests a 5-year metastasis free survival of 93% with chemotherapy, with an absolute chemotherapy benefit in the greater than 12% range  (3) adjuvant radiation 12/31/2018-02/19/2019:  The right chest wall and regional nodes were treated to 50.4 Gy in 28 fractions followed by a 10 Gy boost over 5 fractions.   (4) anastrozole started neoadjuvantly (on 08/14/2018), discontinued 10/14/2018 in preparation for chemotherapy, resumed October 2020  (a) bone density 10/13/2016 showed osteopenia with a lowest score at -2.1  (b) ibandronate/Boniva started January 2021  (c) bone density 11/11/2020 shows a T score of -2.6  (5) Caris testing on mastectomy sample (09/16/2018) showed stable MSI and proficient mismatch repair status, with a Smith mutational burden; BRCA 1 and 2 were not mutated, PI K3 was not mutated, ER B B2 was not mutated, and AKT 1 was not mutated.  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  She had IR guided biopsy  to confirm histology.  This showed met breast cancer ER PR positive, Her 2 neg. She also received palliative radiation to right hip, chest wall and lumbar spine. We started her on Faslodex while awaiting the biopsy results since her previous breast cancer was ER/PR positive.  She is not in the best of shape to pursue aggressive treatments.  I have today recommended that we consider first-line CDK 4 6 inhibition along with Faslodex.  I have discussed about Ibrance since she might tolerate this well.  Her insurance prefers Kisqali or abemaciclib.    I do not believe she will tolerate abemaciclib well especially with her underlying nausea and GI issues.  Will try to obtain prior authorization for Ibrance.  I once again discussed the mechanism of action of Ibrance, adverse effects including but not limited to cytopenias and increased risk of infections.  She understands the intent of treatment is palliative, this is stage IV metastatic breast  cancer and is not curable.  People do respond for a good amount of time on these combinations of drugs and there are several other lines of treatment however she may have to have better functional status to tolerate aggressive treatments.  With regards to the pain, she is being managed by palliative care and they have done an excellent job at titrating her pain medication.  I have reassured her that we are slowly getting to where we should be, she has already received palliative radiation there is no further role for immediate radiation unless the pain is progressive and is not controlled by any other means.  I have also answered all the questions that the sister had for me.  We have discussed the median duration of response with this  combination.  For nausea have asked her to change her Compazine to twice a day and Zofran Smith 8.  She may need some antianxiety medication such as Ativan which will also help control her nausea.  I will discuss this with our palliative care team.  I have clearly discussed about risk of osteonecrosis of jaw and hypocalcemia from zometa, she was given choice of waiting till formal dental clearance is obtained. She however denied any dental issues and would like to proceed with the treatment given metastatic breast cancer to the bone.  She should get Zometa Smith 12 weeks.  Total time spent: 45 minutes  *Total Encounter Time as defined by the Centers for Medicare and Medicaid Services includes, in addition to the face-to-face time of a patient visit (documented in the note above) non-face-to-face time: obtaining and reviewing outside history, ordering and reviewing medications, tests or procedures, care coordination (communications with other health care professionals or caregivers) and documentation in the medical record.

## 2022-11-30 NOTE — Telephone Encounter (Signed)
Pt called and LVM asking for a call back, leaving no further inforamtion. this RN attempted three times to call patient this morning,  at [phone], [phone], [phone], and her husband james at 1000, no answer for any calls, 3 voicemail's left with callback number,

## 2022-11-30 NOTE — Telephone Encounter (Signed)
Oral Oncology Patient Advocate Encounter   Received notification that prior authorization for Doris Smith is required.   PA submitted on 11/30/22 Key [id number] Status is pending     Jinger Neighbors, CPhT-Adv Oncology Pharmacy Patient Advocate [facility] Direct Number: [phone]  Fax: [phone]

## 2022-12-01 ENCOUNTER — Encounter: Payer: Self-pay | Admitting: Hematology and Oncology

## 2022-12-01 ENCOUNTER — Other Ambulatory Visit (HOSPITAL_COMMUNITY): Payer: Self-pay

## 2022-12-01 ENCOUNTER — Telehealth: Payer: Self-pay

## 2022-12-01 ENCOUNTER — Telehealth: Payer: Self-pay | Admitting: Pharmacy Technician

## 2022-12-01 DIAGNOSIS — Z170 Estrogen receptor positive status [ER+]: Secondary | ICD-10-CM

## 2022-12-01 MED ORDER — PALBOCICLIB 100 MG PO TABS
100.0000 mg | ORAL_TABLET | Freq: Every day | ORAL | 3 refills | Status: DC
Start: 2022-12-01 — End: 2023-01-24

## 2022-12-01 NOTE — Telephone Encounter (Addendum)
Oral Oncology Patient Advocate Encounter  Prior Authorization for Ilda Foil has been approved.    PA# [id number] Effective dates: 11/01/22 through 12/01/23  Patient must fill at [facility].    Jinger Neighbors, CPhT-Adv Oncology Pharmacy Patient Advocate [facility] Direct Number: [phone]  Fax: [phone]

## 2022-12-01 NOTE — Telephone Encounter (Signed)
Called Sharniece Bellinger Zimmers spouse Carmindy Rochlin D.O.B. 04/17/1948 employer as requested on yesterday.  Message left at [employer] [phone] (HR) menu opt. 6)) requesting FMLA LOA paperwork to care for family member be faxed to [facility] fax number [phone] along with this nurse name and direct extension if any questions.  Company fax number per on-line search is [phone].  Awaiting form or return call. On 11/30/2022 this nurse met with Albertine Patricia Gaba with family who brought her in wheelchair.  "I have never seen a form,  The only one who knows what I am talking about is a man in the plant who says form was faxed somewhere in [facility].  I need him because I need more help.  Cancer is in my bones and there is so much I an unable to do."  Spouse unable to provide HR representative name or fax number.  "I need a continuous leave.  Have not worked since November 04, 2022."  Provided [facility] fax numbers, Forms e-mail [email or web address] and this nurse information.  Advised unable to assist without paperwork and employers have not returned calls to this nurse advising employee they are not authorized to speak with provider office staff.

## 2022-12-01 NOTE — Telephone Encounter (Signed)
Oral Oncology Patient Advocate Encounter   Was successful in obtaining a copay card for Ibrance.  This copay card will make the patients copay $0.  Maximum annual benefit is $9,450.  The billing information is as follows and has been shared with [facility].   RxBin: [id number] Member ID: [id number] Group ID: [id number]   Jinger Neighbors, CPhT-Adv Oncology Pharmacy Patient Advocate [facility] Direct Number: [phone]  Fax: [phone]

## 2022-12-01 NOTE — Telephone Encounter (Signed)
Oral Oncology Pharmacist Encounter  Received new prescription for Ibrance (palbociclib) for the treatment of ER positive, HER2 negative metastatic breast cancer in conjunction with Faslodex, planned duration until disease progression or unacceptable toxicity.  Labs from 11/30/22 assessed, no interventions needed. Will get ibrance changed to tablets. Prescription dose and frequency assessed for appropriateness.   Current medication list in Epic reviewed, DDIs with Ibrance identified: - nexium: will have patient start with the ibrance tablets to decrease chance of interaction.   Evaluated chart and no patient barriers to medication adherence noted.   Patient agreement for treatment documented in MD note on 11/30/2022.  Prescription has been e-scribed to the [facility] for benefits analysis and approval.  Oral Oncology Clinic will continue to follow for insurance authorization, copayment issues, initial counseling and start date.  Doris Smith, PharmD Hematology/Oncology Clinical Pharmacist [facility] [phone] 12/01/2022 8:35 AM

## 2022-12-06 ENCOUNTER — Other Ambulatory Visit: Payer: Self-pay | Admitting: Nurse Practitioner

## 2022-12-06 ENCOUNTER — Other Ambulatory Visit (HOSPITAL_COMMUNITY): Payer: Self-pay

## 2022-12-06 ENCOUNTER — Other Ambulatory Visit (HOSPITAL_BASED_OUTPATIENT_CLINIC_OR_DEPARTMENT_OTHER): Payer: Self-pay

## 2022-12-06 ENCOUNTER — Other Ambulatory Visit: Payer: Self-pay

## 2022-12-06 ENCOUNTER — Encounter: Payer: Self-pay | Admitting: Hematology and Oncology

## 2022-12-06 DIAGNOSIS — Z515 Encounter for palliative care: Secondary | ICD-10-CM

## 2022-12-06 DIAGNOSIS — G2581 Restless legs syndrome: Secondary | ICD-10-CM

## 2022-12-06 DIAGNOSIS — Z170 Estrogen receptor positive status [ER+]: Secondary | ICD-10-CM

## 2022-12-06 DIAGNOSIS — B370 Candidal stomatitis: Secondary | ICD-10-CM

## 2022-12-06 DIAGNOSIS — G893 Neoplasm related pain (acute) (chronic): Secondary | ICD-10-CM

## 2022-12-06 MED ORDER — NYSTATIN 100000 UNIT/ML MT SUSP
5.0000 mL | Freq: Four times a day (QID) | OROMUCOSAL | 0 refills | Status: DC
Start: 2022-12-06 — End: 2022-12-08

## 2022-12-06 MED ORDER — OXYCODONE HCL ER 10 MG PO T12A
10.0000 mg | EXTENDED_RELEASE_TABLET | Freq: Two times a day (BID) | ORAL | 0 refills | Status: DC
Start: 2022-12-06 — End: 2023-01-04

## 2022-12-06 MED ORDER — OXYCODONE-ACETAMINOPHEN 7.5-325 MG PO TABS
1.0000 | ORAL_TABLET | ORAL | 0 refills | Status: DC | PRN
Start: 2022-12-06 — End: 2022-12-22

## 2022-12-06 MED ORDER — LIDOCAINE 5 % EX PTCH
2.0000 | MEDICATED_PATCH | CUTANEOUS | 0 refills | Status: DC
Start: 2022-12-06 — End: 2023-01-10

## 2022-12-06 NOTE — Telephone Encounter (Signed)
Pt called for a refill and to review schedule, refill sent in and pt made aware of next appt. Pt also discussed with RN how to take anti-emetics as well as her mouthwash, pt verbalized understanding of education, no further needs at this time.   RN called pharmacy and they did not have medications in stock, medications sent to another pharmacy, this pharmacy has medications in stock, as confirmed with pharmacist by this RN. Attempted to call pt and husband back to notify of the change in pharmacy. No answer, LVM and callback number.

## 2022-12-07 ENCOUNTER — Telehealth: Payer: Self-pay

## 2022-12-07 NOTE — Telephone Encounter (Signed)
Pt called and LVM again explaining that she was unable to pick up her medications. This RN called the pharmacy and confirmed that pt is able to pick up her medications and that she is just too early for her OxyContin, her insurance will clear her for pick up on this coming Saturday. In pt VM she reports having enough pills until this Saturday. This RN again attempted to return call to pt but no answer, this RN left detailed message on voicemail.

## 2022-12-08 ENCOUNTER — Telehealth: Payer: Self-pay

## 2022-12-08 ENCOUNTER — Other Ambulatory Visit: Payer: Self-pay | Admitting: Nurse Practitioner

## 2022-12-08 DIAGNOSIS — B370 Candidal stomatitis: Secondary | ICD-10-CM

## 2022-12-08 DIAGNOSIS — Z170 Estrogen receptor positive status [ER+]: Secondary | ICD-10-CM

## 2022-12-08 DIAGNOSIS — Z515 Encounter for palliative care: Secondary | ICD-10-CM

## 2022-12-08 NOTE — Telephone Encounter (Signed)
Pt called this morning updated pt and husband on medication and pharmacy, no further needs at this time.

## 2022-12-12 NOTE — Progress Notes (Unsigned)
Palliative Medicine [facility]  [phone] [phone]   Name: Doris Smith Date: 12/12/2022 MRN: [id number]  DOB: 01-29-1951  Patient Care Team: [name], FNP as PCP - General (Family Medicine) Jodelle Gross, NP as PCP - Cardiology (Nurse Practitioner) Ovidio Kin, MD as Consulting Physician (General Surgery) Dorothy Puffer, MD as Consulting Physician (Radiation Oncology) Lysle Pearl, MD as Consulting Physician (Pulmonary Disease) Beverely Low, MD as Consulting Physician (Orthopedic Surgery) Jene Every, MD as Consulting Physician (Orthopedic Surgery) Oneta Rack, NP as Nurse Practitioner (Adult Health Nurse Practitioner) Pershing Proud, RN as Oncology Nurse Navigator Donnelly Angelica, RN as Oncology Nurse Navigator Glenna Fellows, MD as Consulting Physician (Plastic Surgery) Rollene Rotunda, MD as Consulting Physician (Cardiology) Rachel Moulds, MD as Medical Oncologist (Hematology and Oncology)    INTERVAL HISTORY: Doris Smith is a 72 y.o. female with oncologic medical history including estrogen receptor positive breast cancer(07/2018) s/p right mastectomy. PET scan 10/06/22 showing widespread osseous metastasis. Other pertinent history includes atrial fibrillation, asthma, OSA, GERD, fatty liver, fibromyalgia, osteoporosis, chronic headaches, anxiety, depression, and bipolar disorder. Underwent ORIF of right hip 09/20/2022. Palliative ask to see for symptom and pain management and goals of care.   SOCIAL HISTORY:    Mrs. Bechtold reports that she has never smoked. She has never used smokeless tobacco. She reports that she does not drink alcohol and does not use drugs.  ADVANCE DIRECTIVES:  Advanced directives on file, Livia Snellen Bleau, spouse, was identified as Management consultant should patient be unable to make decision for herself   CODE STATUS: Full code  PAST MEDICAL HISTORY: Past Medical History:   Diagnosis Date   A-fib (HCC) 11/2012   PAF   Anxiety    takes Ativan   Asthma 04/07/2011   dx   Bipolar disorder (HCC)    Cancer (HCC)    Right breast   Depression    Early cataracts, bilateral    Fatty liver    Fibromyalgia    GERD (gastroesophageal reflux disease)    Irritable bowel syndrome    Mental disorder    dx bipolar   PONV (postoperative nausea and vomiting)    Sleep apnea 04/2011    does not wear CPAP    ALLERGIES:  is allergic to iohexol, codeine, palonosetron, prednisone, sulfonamide derivatives, celecoxib, sulfa antibiotics, and vitamin b12.  MEDICATIONS:  Current Outpatient Medications  Medication Sig Dispense Refill   apixaban (ELIQUIS) 5 MG TABS tablet Take 1 tablet (5 mg total) by mouth 2 (two) times daily. 180 tablet 1   clotrimazole (MYCELEX) 10 MG troche Take 1 tablet (10 mg total) by mouth 5 (five) times daily. 50 Troche 0   clotrimazole-betamethasone (LOTRISONE) cream APPLY  CREAM TOPICALLY TWICE DAILY 45 g 0   esomeprazole (NEXIUM) 40 MG capsule Take 1 capsule by mouth once daily 90 capsule 0   fluticasone (FLONASE) 50 MCG/ACT nasal spray Place 2 sprays into both nostrils daily. 16 g 6   furosemide (LASIX) 20 MG tablet Take 1 tablet (20 mg total) by mouth daily. (Patient not taking: Reported on 10/11/2022) 90 tablet 1   gabapentin (NEURONTIN) 300 MG capsule Take 1 capsule (300 mg total) by mouth 3 (three) times daily. 90 capsule 5   Incontinence Supply Disposable (CERTAINTY FITTED BRIEFS XL) MISC 1 Package by Does not apply route as needed. 56 each 3   lidocaine (LIDODERM) 5 % Place 2 patches onto the skin daily. Remove & Discard patch within  12 hours or as directed by MD 60 patch 0   loratadine (CLARITIN) 10 MG tablet Take 10 mg by mouth daily.     Lurasidone HCl (LATUDA) 60 MG TABS TAKE 1 TABLET BY MOUTH ONCE DAILY 90 tablet 0   magic mouthwash (nystatin, diphenhydrAMINE, alum & mag hydroxide) suspension mixture Swish and swallow 5 mLs 4 (four) times  daily as needed for mouth pain. 240 mL 1   metoprolol tartrate (LOPRESSOR) 25 MG tablet Take 1 tablet (25 mg total) by mouth 2 (two) times daily. 180 tablet 1   midodrine (PROAMATINE) 2.5 MG tablet Take 1 tablet (2.5 mg total) by mouth 3 (three) times daily with meals. 270 tablet 3   ondansetron (ZOFRAN) 8 MG tablet Take 1 tablet (8 mg total) by mouth every 8 (eight) hours as needed for nausea. 30 tablet 3   oxyCODONE (OXYCONTIN) 10 mg 12 hr tablet Take 1 tablet (10 mg total) by mouth every 12 (twelve) hours. 60 tablet 0   oxyCODONE-acetaminophen (PERCOCET) 7.5-325 MG tablet Take 1 tablet by mouth every 4 (four) hours as needed for severe pain. 60 tablet 0   palbociclib (IBRANCE) 100 MG tablet Take 1 tablet (100 mg total) by mouth daily. Take for 21 days on, 7 days off, repeat every 28 days. 21 tablet 3   pramipexole (MIRAPEX) 1 MG tablet TAKE 1 TABLET BY MOUTH THREE TIMES DAILY 90 tablet 0   prochlorperazine (COMPAZINE) 10 MG tablet Take 1 tablet (10 mg total) by mouth daily. 30 tablet 2   tizanidine (ZANAFLEX) 2 MG capsule Take 2 mg by mouth 3 (three) times daily as needed. (Patient not taking: Reported on 10/11/2022)     triamcinolone (KENALOG) 0.1 % Apply 1 application. topically 2 (two) times daily.     Vilazodone HCl (VIIBRYD) 40 MG TABS Take 1 tablet (40 mg total) by mouth daily. 90 tablet 1   No current facility-administered medications for this visit.   Facility-Administered Medications Ordered in Other Visits  Medication Dose Route Frequency Provider Last Rate Last Admin   heparin lock flush 100 unit/mL  500 Units Intravenous Once Magrinat, Valentino Hue, MD       sodium chloride flush (NS) 0.9 % injection 10 mL  10 mL Intravenous PRN Magrinat, Valentino Hue, MD        VITAL SIGNS: There were no vitals taken for this visit. There were no vitals filed for this visit.    Estimated body mass index is 38.79 kg/m as calculated from the following:   Height as of 10/25/22: 5\' 4"  (1.626 m).    Weight as of 11/30/22: 226 lb (102.5 kg).   PERFORMANCE STATUS (ECOG) : 2 - Symptomatic, <50% confined to bed   Physical Exam: General: NAD, sitting in wheelchair  Cardiovascular: regular rate and rhythm Pulmonary: normal breathing pattern  Abdomen: soft, nontender, + bowel sounds Extremities: trace lower extremity edema, no joint deformities Neurological: AAO x4  IMPRESSION:  Mrs. Tinnon presents to clinic for symptom management follow-up. No acute distress noted. Denies nausea and vomiting today however continues to be intermittent. Denies constipation or diarrhea. Overall feels "ok".   Neoplasm related pain Mrs. Bartolomei reports her pain is still there but improved. Some days continue to be better than others.   We discussed her regimen at length.  OxyContin every 12 hours.  Percocet every 6 hours as needed for breakthrough pain.  Patient and husband confirms she is taking extended release as prescribed.  She is not requiring breakthrough medication  around-the-clock.  Husband states she may take 1-2 times daily.  Tolerating gabapentin 300 mg twice daily and 600mg  at bedtime. Lidocaine patches for additional relief.  Will continue to closely monitor.    2.   Constipation/Nausea Constipation is controlled. Continues to have intermittent nausea and vomiting. Husband reports more of a "dry heaving" but on occasions she has thrown up. Antiemetics on hand. We discussed how to effectively use.  3.   Goals of Care 5/23: Mrs. Robillard is emotional. Her husband becomes emotional as patient shares she knows her health is not the best and her cancer is not curable. She is taking life one day at time. Speaks to when her time comes she will be ready but hopeful it is not now. She knows that no one has a definitive time frame. Expresses her appreciation in all of the care and support. Expresses her goal is to continue to treat the treatable allowing her the ability to continue to thrive while not suffering.    4/30 ;Since the night of 10/30/22, Mrs. Leuck has continued to meet goal of lying in her own bed at night beside her husband. She expresses appreciation for the care of everyone at the cancer center and is hopeful for continued improvement.  We discussed Her current illness and what it means in the larger context of her on-going co-morbidities. Natural disease trajectory and expectations were discussed. We discussed the importance of continued conversation with family and their medical providers regarding overall plan of care and treatment options, ensuring decisions are within the context of the patients values and GOCs.  PLAN: OxyContin 15 mg PO every 12 hours.  Gabapentin 300 mg every morning, 300 mg mid-day, and 600 mg at bedtime.  Lidocaine patches to back and hip Prescription provided for briefs.  Percocet 7.5/325 every 4-6 hours as needed for breakthrough pain.  Not requiring around the clock over past few days.  Miralax for constipation. Encouraged patient to bring medications to appointments as she may need to premedicate for pain prior to treatments.  Palliative will plan to see patient back in 1-2 weeks in collaboration to other oncology appointments   Patient expressed understanding and was in agreement with this plan. She also understands that she an call the clinic at any time with any questions, concerns, or complaints.   Any controlled substances utilized were prescribed in the context of palliative care. PDMP has been reviewed.    Visit consisted of counseling and education dealing with the complex and emotionally intense issues of symptom management and palliative care in the setting of serious and potentially life-threatening illness.Greater than 50%  of this time was spent counseling and coordinating care related to the above assessment and plan.  Willette Alma, AGPCNP-BC  Palliative Medicine Team/[facility]  *Please note that this is a verbal  dictation therefore any spelling or grammatical errors are due to the "[employer]" system interpretation.

## 2022-12-13 ENCOUNTER — Telehealth: Payer: Self-pay

## 2022-12-13 ENCOUNTER — Other Ambulatory Visit: Payer: Self-pay

## 2022-12-13 DIAGNOSIS — R110 Nausea: Secondary | ICD-10-CM

## 2022-12-13 DIAGNOSIS — Z515 Encounter for palliative care: Secondary | ICD-10-CM

## 2022-12-13 DIAGNOSIS — Z170 Estrogen receptor positive status [ER+]: Secondary | ICD-10-CM

## 2022-12-13 DIAGNOSIS — G893 Neoplasm related pain (acute) (chronic): Secondary | ICD-10-CM

## 2022-12-13 MED ORDER — ONDANSETRON HCL 8 MG PO TABS
8.0000 mg | ORAL_TABLET | Freq: Three times a day (TID) | ORAL | 3 refills | Status: DC | PRN
Start: 2022-12-13 — End: 2023-05-03

## 2022-12-13 MED ORDER — PROCHLORPERAZINE MALEATE 10 MG PO TABS
10.0000 mg | ORAL_TABLET | Freq: Three times a day (TID) | ORAL | 2 refills | Status: DC | PRN
Start: 2022-12-13 — End: 2023-02-05

## 2022-12-13 NOTE — Telephone Encounter (Signed)
Pt called reporting that her nausea was back and she was gagging. Pt explained that she was not taking her antiemetics because they were "not working" for her. RN explained that pt needs to take them more frequently as discussed before. Extensive education provided to pt and to husband, [name], about how to alternate antiemetics and provide better relief. Pt and husband verbalized understanding of education, of schedule, and instructions to bring medication to the appt. No further needs at this time.

## 2022-12-14 ENCOUNTER — Encounter: Payer: Self-pay | Admitting: Nurse Practitioner

## 2022-12-14 ENCOUNTER — Inpatient Hospital Stay (HOSPITAL_BASED_OUTPATIENT_CLINIC_OR_DEPARTMENT_OTHER): Payer: BC Managed Care – PPO | Admitting: Nurse Practitioner

## 2022-12-14 ENCOUNTER — Other Ambulatory Visit: Payer: Self-pay

## 2022-12-14 ENCOUNTER — Inpatient Hospital Stay: Payer: BC Managed Care – PPO | Attending: Hematology and Oncology | Admitting: Adult Health

## 2022-12-14 ENCOUNTER — Encounter: Payer: Self-pay | Admitting: Adult Health

## 2022-12-14 VITALS — BP 121/63 | HR 85 | Temp 97.7°F | Resp 18 | Ht 64.0 in | Wt 222.8 lb

## 2022-12-14 DIAGNOSIS — Z515 Encounter for palliative care: Secondary | ICD-10-CM

## 2022-12-14 DIAGNOSIS — R530 Neoplastic (malignant) related fatigue: Secondary | ICD-10-CM | POA: Diagnosis not present

## 2022-12-14 DIAGNOSIS — Z170 Estrogen receptor positive status [ER+]: Secondary | ICD-10-CM

## 2022-12-14 DIAGNOSIS — C7951 Secondary malignant neoplasm of bone: Secondary | ICD-10-CM | POA: Diagnosis not present

## 2022-12-14 DIAGNOSIS — Z79899 Other long term (current) drug therapy: Secondary | ICD-10-CM | POA: Insufficient documentation

## 2022-12-14 DIAGNOSIS — Z9221 Personal history of antineoplastic chemotherapy: Secondary | ICD-10-CM | POA: Insufficient documentation

## 2022-12-14 DIAGNOSIS — C50411 Malignant neoplasm of upper-outer quadrant of right female breast: Secondary | ICD-10-CM | POA: Insufficient documentation

## 2022-12-14 DIAGNOSIS — G893 Neoplasm related pain (acute) (chronic): Secondary | ICD-10-CM

## 2022-12-14 DIAGNOSIS — Z9011 Acquired absence of right breast and nipple: Secondary | ICD-10-CM | POA: Diagnosis not present

## 2022-12-14 DIAGNOSIS — Z923 Personal history of irradiation: Secondary | ICD-10-CM | POA: Diagnosis not present

## 2022-12-14 DIAGNOSIS — R110 Nausea: Secondary | ICD-10-CM

## 2022-12-14 NOTE — Progress Notes (Signed)
[facility] Cancer Follow up:    Doris Pierini, FNP [address] [place] [address]   DIAGNOSIS:  Cancer Staging  Malignant neoplasm of upper-outer quadrant of right breast in female, estrogen receptor positive (HCC) Staging form: Breast, AJCC 8th Edition - Clinical stage from 08/14/2018: Stage IIA (cT2, cN0, cM0, G3, ER+, PR+, HER2-) - Unsigned Histologic grading system: 3 grade system Laterality: Right Staged by: Pathologist and managing physician Stage used in treatment planning: Yes National guidelines used in treatment planning: Yes Type of national guideline used in treatment planning: NCCN - Pathologic: Stage IB (pT3, pN37mi, cM0, G2, ER+, PR+, HER2-) - Signed by Doris Socks, NP on 10/02/2018 Multigene prognostic tests performed: MammaPrint Histologic grading system: 3 grade system   SUMMARY OF ONCOLOGIC HISTORY: 72 y.o. Doris Smith, [place] woman status post right breast biopsy 08/02/2018 for a clinical T3 N0, stage IIA invasive lobular carcinoma, grade 2, estrogen receptor strongly positive, progesterone receptor 1% positive, with no HER-2 amplification and an MIB-1 of 1%.             (a) CT scan of the head and chest, without contrast 08/23/2018 showed nonspecific 0.4 cm left lower lobe pulmonary nodule, no definitive metastatic disease             (b) bone scan 08/23/2018 shows multiple spinal areas of abnormal uptake, but             (c) total spinal MRI 09/03/2018 finds bone scan findings to be secondary to degenerative disease, no evidence of metastatic disease.   (1) status post right mastectomy and sentinel lymph node sampling 09/16/2018 showing a pT3 pN1(mic), stage IIA-IIIA invasive lobular breast cancer, grade 2, with 1 of 5 sampled lymph nodes involved by micrometastatic deposit, with extra nodular extension; ample margins   (2)  MammaPrint "high risk" suggests a 5-year metastasis free survival of 93% with chemotherapy, with an  absolute chemotherapy benefit in the greater than 12% range   (3) adjuvant radiation 12/31/2018-02/19/2019:  The right chest wall and regional nodes were treated to 50.4 Gy in 28 fractions followed by a 10 Gy boost over 5 fractions.    (4) anastrozole started neoadjuvantly (on 08/14/2018), discontinued 10/14/2018 in preparation for chemotherapy, resumed October 2020             (a) bone density 10/13/2016 showed osteopenia with a lowest score at -2.1             (b) ibandronate/Boniva started January 2021             (c) bone density 11/11/2020 shows a T score of -2.6   (5) Caris testing on mastectomy sample (09/16/2018) showed stable MSI and proficient mismatch repair status, with a low mutational burden; BRCA 1 and 2 were not mutated, PI K3 was not mutated, ER B B2 was not mutated, and AKT 1 was not mutated.  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  Widespread osseous metastasis confirmed on PET scan that was completed on October 06, 2022.  Right iliac crest biopsy demonstrated metastatic carcinoma consistent with patient's known breast carcinoma.  ER 90% positive, PR 90% positive, Ki67 10%, HER2 negative.  (2) Palliative Radiation 10/23/2022-11/03/2022:  left chest wall, lower T spine, and right proximal hip/pelvis were each treated to 30 GY in 10 fractions.   (3) Faslodex beginning 10/10/2022; Doris Smith beginning 12/20/2022; Zometa every 12 weeks 11/07/2022  CURRENT THERAPY:  INTERVAL HISTORY: Doris Smith 72 y.o. female returns for follow-up on treatment  with Faslodex and Zometa.  She has not yet started Doris Smith.  She has not received this medication.  She tells me her 2 main issues are nausea and back pain.  She is seeing Doris Smith our palliative care nurse practitioner who is helping manage her pain.  She tells me that she is waiting until her pain is a 10 to take her Percocet.     Patient Active Problem List   Diagnosis Date Noted   Restless leg syndrome 10/03/2021   Anxiety associated with depression 06/10/2020   Port-A-Cath in place 11/12/2018   Malignant neoplasm of upper-outer quadrant of right breast in female, estrogen receptor positive (HCC) 08/08/2018   Right knee pain 03/01/2015   Aortic atherosclerosis (HCC) 12/28/2014   Obesity (BMI 30-39.9) 04/09/2014   Insomnia due to stress 04/09/2014   Atrial fibrillation (HCC) 10/01/2013   GERD (gastroesophageal reflux disease) 10/01/2013   Bipolar disorder (HCC) 09/27/2007   Essential hypertension 09/27/2007   ALLERGIC RHINITIS 09/27/2007   IRRITABLE BOWEL SYNDROME 09/27/2007   Arthropathy 09/27/2007   DEGENERATIVE DISC DISEASE, LUMBOSACRAL SPINE 09/27/2007    is allergic to iohexol, codeine, palonosetron, prednisone, sulfonamide derivatives, celecoxib, sulfa antibiotics, and vitamin b12.  MEDICAL HISTORY: Past Medical History:  Diagnosis Date   A-fib (HCC) 11/2012   PAF   Anxiety    takes Ativan   Asthma 04/07/2011   dx   Bipolar disorder (HCC)    Cancer (HCC)    Right breast   Depression    Early cataracts, bilateral    Fatty liver    Fibromyalgia    GERD (gastroesophageal reflux disease)    Irritable bowel syndrome    Mental disorder    dx bipolar   PONV (postoperative nausea and vomiting)    Sleep apnea 04/2011    does not wear CPAP    SURGICAL HISTORY: Past Surgical History:  Procedure Laterality Date   ABDOMINAL HYSTERECTOMY     COLONOSCOPY     DILATION AND CURETTAGE OF UTERUS  1981   abnormal pap   KNEE ARTHROSCOPY Left 2007   x 2   LUMBAR  LAMINECTOMY/DECOMPRESSION MICRODISCECTOMY  05/31/2011   Procedure: LUMBAR LAMINECTOMY/DECOMPRESSION MICRODISCECTOMY;  Surgeon: Javier Docker;  Location: [facility];  Service: Orthopedics;  Laterality: Left;  Decompression Lumbar four to five and  Lumbar five to Sacral one on Left  (X-Ray)   MASTECTOMY W/ SENTINEL NODE BIOPSY Right 09/16/2018   Procedure: RIGHT MASTECTOMY WITH RIGHT AXILLARY SENTINEL LYMPH NODE BIOPSY;  Surgeon: Ovidio Kin, MD;  Location: [facility] OR;  Service: General;  Laterality: Right;   PARTIAL HYSTERECTOMY  1982   PORTACATH PLACEMENT Left 10/31/2018   Procedure: INSERTION PORT-A-CATH WITH ULTRASOUND;  Surgeon: Ovidio Kin, MD;  Location: [facility];  Service: General;  Laterality: Left;   TONSILLECTOMY     as child   TOTAL KNEE ARTHROPLASTY Left 12/18/2005    SOCIAL HISTORY: Social History   Socioeconomic History   Marital status: Married    Spouse name: Doris Smith   Number of children:  2   Years of education: 82   Highest education level: Not on file  Occupational History   Occupation: Disabled  Tobacco Use   Smoking status: Never   Smokeless tobacco: Never  Vaping Use   Vaping Use: Never used  Substance and Sexual Activity   Alcohol use: No   Drug use: No   Sexual activity: Yes    Birth control/protection: Post-menopausal, Surgical  Other Topics Concern   Not on file  Social History Narrative   Tea daily.  Rarely has caffeine    Social Determinants of Health   Financial Resource Strain: Low Risk  (08/18/2022)   Overall Financial Resource Strain (CARDIA)    Difficulty of Paying Living Expenses: Not hard at all  Food Insecurity: Unknown (08/18/2022)   Hunger Vital Sign    Worried About Running Out of Food in the Last Year: Patient declined    Ran Out of Food in the Last Year: Never true  Transportation Needs: No Transportation Needs (08/18/2022)   PRAPARE - Administrator, Civil Service (Medical): No    Lack of Transportation  (Non-Medical): No  Physical Activity: Inactive (08/18/2022)   Exercise Vital Sign    Days of Exercise per Week: 0 days    Minutes of Exercise per Session: 0 min  Stress: No Stress Concern Present (08/18/2022)   [employer] of Occupational Health - Occupational Stress Questionnaire    Feeling of Stress : Not at all  Social Connections: Moderately Integrated (08/18/2022)   Social Connection and Isolation Panel [NHANES]    Frequency of Communication with Friends and Family: More than three times a week    Frequency of Social Gatherings with Friends and Family: More than three times a week    Attends Religious Services: More than 4 times per year    Active Member of [employer] or Organizations: No    Attends Banker Meetings: Never    Marital Status: Married  Catering manager Violence: Not At Risk (08/18/2022)   Humiliation, Afraid, Rape, and Kick questionnaire    Fear of Current or Ex-Partner: No    Emotionally Abused: No    Physically Abused: No    Sexually Abused: No    FAMILY HISTORY: Family History  Problem Relation Age of Onset   Anesthesia problems Mother    Heart disease Mother        CHF, atrial fib   Heart failure Mother    Anesthesia problems Sister    Colon polyps Father    Heart disease Father        "Fluid around the heart"   Hypertension Sister    Breast cancer Maternal Aunt    Colon cancer Maternal Aunt    Prostate cancer Neg Hx    Ovarian cancer Neg Hx     Review of Systems  Constitutional:  Positive for fatigue. Negative for appetite change, chills, fever and unexpected weight change.  HENT:   Negative for hearing loss, lump/mass and trouble swallowing.   Eyes:  Negative for eye problems and icterus.  Respiratory:  Negative for chest tightness, cough and shortness of breath.   Cardiovascular:  Negative for chest pain, leg swelling and palpitations.  Gastrointestinal:  Positive for nausea. Negative for abdominal distention, abdominal pain,  constipation, diarrhea and vomiting.  Endocrine: Negative for hot flashes.  Genitourinary:  Negative for difficulty urinating.   Musculoskeletal:  Positive for back pain. Negative for arthralgias.  Skin:  Negative for itching and rash.  Neurological:  Negative for dizziness, extremity weakness, headaches and numbness.  Hematological:  Negative for adenopathy. Does not bruise/bleed easily.  Psychiatric/Behavioral:  Negative for depression. The patient is not nervous/anxious.       PHYSICAL EXAMINATION   Onc Performance Status - 12/14/22 1054       ECOG Perf Status   ECOG Perf Status Capable of only limited selfcare, confined to bed or chair more than 50% of waking hours      KPS SCALE   KPS % SCORE Requires considerable assistance, and frequent medical care             Vitals:   12/14/22 1052  BP: 121/63  Pulse: 85  Resp: 18  Temp: 97.7 F (36.5 C)  SpO2: 95%    Physical Exam Constitutional:      General: She is not in acute distress.    Appearance: Normal appearance. She is not toxic-appearing.     Comments: Examined in wheelchair  HENT:     Head: Normocephalic and atraumatic.     Comments: + lip smacking     Mouth/Throat:     Mouth: Mucous membranes are moist.     Pharynx: Oropharynx is clear. No oropharyngeal exudate or posterior oropharyngeal erythema.  Eyes:     General: No scleral icterus. Cardiovascular:     Rate and Rhythm: Normal rate and regular rhythm.     Pulses: Normal pulses.     Heart sounds: Normal heart sounds.  Pulmonary:     Effort: Pulmonary effort is normal.     Breath sounds: Normal breath sounds.  Abdominal:     General: Abdomen is flat. Bowel sounds are normal. There is no distension.     Palpations: Abdomen is soft.     Tenderness: There is no abdominal tenderness.  Musculoskeletal:        General: No swelling.     Cervical back: Neck supple.  Lymphadenopathy:     Cervical: No cervical adenopathy.  Skin:    General: Skin is  warm and dry.     Findings: No rash.  Neurological:     General: No focal deficit present.     Mental Status: She is alert.  Psychiatric:        Mood and Affect: Mood normal.        Behavior: Behavior normal.     LABORATORY DATA:  CBC    Component Value Date/Time   WBC 5.5 11/30/2022 1339   RBC 2.93 (L) 11/30/2022 1339   HGB 9.1 (L) 11/30/2022 1339   HGB 12.0 06/05/2022 1024   HCT 28.2 (L) 11/30/2022 1339   HCT 37.4 06/05/2022 1024   PLT 163 11/30/2022 1339   PLT 251 06/05/2022 1024   MCV 96.2 11/30/2022 1339   MCV 86 06/05/2022 1024   MCH 31.1 11/30/2022 1339   MCHC 32.3 11/30/2022 1339   RDW 18.7 (H) 11/30/2022 1339   RDW 15.5 (H) 06/05/2022 1024   LYMPHSABS 0.4 (L) 11/30/2022 1339   LYMPHSABS 0.9 06/05/2022 1024   MONOABS 0.6 11/30/2022 1339   EOSABS 0.1 11/30/2022 1339   EOSABS 0.1 06/05/2022 1024   BASOSABS 0.0 11/30/2022 1339   BASOSABS 0.1 06/05/2022 1024    CMP     Component Value Date/Time   NA 141 11/30/2022 1339   NA 145 (H) 06/05/2022 1024   K 3.4 (L) 11/30/2022 1339   CL 103 11/30/2022 1339   CO2 28 11/30/2022 1339   GLUCOSE 168 (H) 11/30/2022 1339   BUN 6 (  L) 11/30/2022 1339   BUN 17 06/05/2022 1024   CREATININE 0.80 11/30/2022 1339   CREATININE 1.07 (H) 08/25/2022 0921   CALCIUM 8.7 (L) 11/30/2022 1339   PROT 6.3 (L) 11/30/2022 1339   PROT 6.6 06/05/2022 1024   ALBUMIN 3.5 11/30/2022 1339   ALBUMIN 4.0 06/05/2022 1024   AST 23 11/30/2022 1339   AST 22 08/25/2022 0921   ALT 13 11/30/2022 1339   ALT 26 08/25/2022 0921   ALKPHOS 232 (H) 11/30/2022 1339   BILITOT 0.7 11/30/2022 1339   BILITOT 0.5 08/25/2022 0921   GFRNONAA >60 11/30/2022 1339   GFRNONAA 56 (L) 08/25/2022 0921   GFRAA 81 02/03/2020 1049   GFRAA >60 12/19/2018 1434      ASSESSMENT and THERAPY PLAN:   Malignant neoplasm of upper-outer quadrant of right breast in female, estrogen receptor positive (HCC) [name] is a 72 year old woman with metastatic breast cancer here  today for follow-up and evaluation.  Metastatic breast cancer to the bone: She is tolerating Faslodex and Zometa well she will continue this.  She has not yet received her Ibrance.  I reached out to our oral pharmacist to follow-up on this. Cancer related pain: Her pain management still has opportunities for improvement.  She is following up with Doris Smith and palliative care to discuss this further. Nausea: This is slightly improved she will continue to follow-up with [facility] and palliative care for further suggestions since she is managing this. Tardive dyskinesia: I confirmed with Nikki and palliative care that this was present prior to her beginning any medications that we prescribed.  [name] will return on June 23 for labs, follow-up, and her next injection.   All questions were answered. The patient knows to call the clinic with any problems, questions or concerns. We can certainly see the patient much sooner if necessary.  Total encounter time:30 minutes*in face-to-face visit time, chart review, lab review, care coordination, order entry, and documentation of the encounter time.    Lillard Anes, NP 12/15/22 8:52 AM Medical Oncology and Hematology [facility] [address] [place], [place] [address] Tel. [phone]    Fax. [phone]  *Total Encounter Time as defined by the Centers for Medicare and Medicaid Services includes, in addition to the face-to-face time of a patient visit (documented in the note above) non-face-to-face time: obtaining and reviewing outside history, ordering and reviewing medications, tests or procedures, care coordination (communications with other health care professionals or caregivers) and documentation in the medical record.

## 2022-12-15 ENCOUNTER — Encounter: Payer: Self-pay | Admitting: Hematology and Oncology

## 2022-12-15 NOTE — Telephone Encounter (Signed)
Oral Oncology Pharmacist Encounter  [facility] pharmacy has attempted to reach patient daily for 5 days to schedule shipment of Ibrance and has been unable to. I have attempted to call patient as well and it has been sent to voicemail. Voicemail was left for patient.   Will continue to attempt to reach patient.   Bethel Born, PharmD Hematology/Oncology Clinical Pharmacist [facility] [phone]

## 2022-12-15 NOTE — Assessment & Plan Note (Signed)
Doris Smith is a 72 year old woman with metastatic breast cancer here today for follow-up and evaluation.  Metastatic breast cancer to the bone: She is tolerating Faslodex and Zometa well she will continue this.  She has not yet received her Ibrance.  I reached out to our oral pharmacist to follow-up on this. Cancer related pain: Her pain management still has opportunities for improvement.  She is following up with [name] and palliative care to discuss this further. Nausea: This is slightly improved she will continue to follow-up with [facility] and palliative care for further suggestions since she is managing this. Tardive dyskinesia: I confirmed with Nikki and palliative care that this was present prior to her beginning any medications that we prescribed.  Doris Smith will return on June 23 for labs, follow-up, and her next injection.

## 2022-12-18 ENCOUNTER — Ambulatory Visit: Payer: BC Managed Care – PPO | Admitting: Nurse Practitioner

## 2022-12-18 ENCOUNTER — Telehealth: Payer: Self-pay

## 2022-12-18 NOTE — Telephone Encounter (Signed)
Per [facility] Pharmacist, Bethel Born, there is difficulty getting in touch with pt regarding Ibrance delivery. This LPN attempted to call pt and her husband X3 with no answer. LVM for call back.

## 2022-12-19 ENCOUNTER — Telehealth: Payer: Self-pay

## 2022-12-19 DIAGNOSIS — F410 Panic disorder [episodic paroxysmal anxiety] without agoraphobia: Secondary | ICD-10-CM | POA: Diagnosis not present

## 2022-12-19 DIAGNOSIS — L237 Allergic contact dermatitis due to plants, except food: Secondary | ICD-10-CM | POA: Diagnosis not present

## 2022-12-19 DIAGNOSIS — G4730 Sleep apnea, unspecified: Secondary | ICD-10-CM | POA: Diagnosis not present

## 2022-12-19 DIAGNOSIS — M545 Low back pain, unspecified: Secondary | ICD-10-CM | POA: Diagnosis not present

## 2022-12-19 DIAGNOSIS — M80051D Age-related osteoporosis with current pathological fracture, right femur, subsequent encounter for fracture with routine healing: Secondary | ICD-10-CM | POA: Diagnosis not present

## 2022-12-19 DIAGNOSIS — G8929 Other chronic pain: Secondary | ICD-10-CM | POA: Diagnosis not present

## 2022-12-19 DIAGNOSIS — I480 Paroxysmal atrial fibrillation: Secondary | ICD-10-CM | POA: Diagnosis not present

## 2022-12-19 DIAGNOSIS — I700 Atherosclerosis of aorta: Secondary | ICD-10-CM | POA: Diagnosis not present

## 2022-12-19 DIAGNOSIS — M797 Fibromyalgia: Secondary | ICD-10-CM | POA: Diagnosis not present

## 2022-12-19 DIAGNOSIS — K760 Fatty (change of) liver, not elsewhere classified: Secondary | ICD-10-CM | POA: Diagnosis not present

## 2022-12-19 DIAGNOSIS — F4310 Post-traumatic stress disorder, unspecified: Secondary | ICD-10-CM | POA: Diagnosis not present

## 2022-12-19 DIAGNOSIS — J45909 Unspecified asthma, uncomplicated: Secondary | ICD-10-CM | POA: Diagnosis not present

## 2022-12-19 DIAGNOSIS — F419 Anxiety disorder, unspecified: Secondary | ICD-10-CM | POA: Diagnosis not present

## 2022-12-19 DIAGNOSIS — F319 Bipolar disorder, unspecified: Secondary | ICD-10-CM | POA: Diagnosis not present

## 2022-12-19 DIAGNOSIS — G629 Polyneuropathy, unspecified: Secondary | ICD-10-CM | POA: Diagnosis not present

## 2022-12-19 DIAGNOSIS — C50411 Malignant neoplasm of upper-outer quadrant of right female breast: Secondary | ICD-10-CM | POA: Diagnosis not present

## 2022-12-19 DIAGNOSIS — K589 Irritable bowel syndrome without diarrhea: Secondary | ICD-10-CM | POA: Diagnosis not present

## 2022-12-19 DIAGNOSIS — F312 Bipolar disorder, current episode manic severe with psychotic features: Secondary | ICD-10-CM | POA: Diagnosis not present

## 2022-12-19 DIAGNOSIS — Z791 Long term (current) use of non-steroidal anti-inflammatories (NSAID): Secondary | ICD-10-CM | POA: Diagnosis not present

## 2022-12-19 DIAGNOSIS — Z7901 Long term (current) use of anticoagulants: Secondary | ICD-10-CM | POA: Diagnosis not present

## 2022-12-19 NOTE — Telephone Encounter (Signed)
Oral Oncology Pharmacist Encounter  Attempted to call patient on 12/19/22 and phone went straight to voicemail. Voicemail left.   Bethel Born, PharmD Hematology/Oncology Clinical Pharmacist [facility] [phone]

## 2022-12-19 NOTE — Telephone Encounter (Signed)
Pt returned call. We spoke extensively regarding Ibrance. Pt reports she has no recollection of the need for this medication and did not know she needed to set up home delivery. She and I spoke about this last week, as well as Lillard Anes, NP and Bethel Born, RPH. Pt does not recall being educated on this drug.  Advised pt it would be a good idea to save our number so she would know it is [place] calling as we have had difficulty getting in touch with her. Educated pt on importance of communication with [place] d/t the nature of the drug she will be receiving. She was provided with [facility]'s phone number to call and set up delivery.

## 2022-12-20 ENCOUNTER — Telehealth: Payer: Self-pay

## 2022-12-20 NOTE — Telephone Encounter (Signed)
Pt called and left a voicemail with just a callback number, RN attempted to return call, no answer, LVM and callback number.

## 2022-12-21 NOTE — Telephone Encounter (Signed)
Oral Oncology Pharmacist Encounter   Attempted to call patient on 12/21/22 and phone went to voicemail.   Doris Smith, PharmD Hematology/Oncology Clinical Pharmacist [facility] [phone]

## 2022-12-22 ENCOUNTER — Other Ambulatory Visit: Payer: Self-pay

## 2022-12-22 DIAGNOSIS — G893 Neoplasm related pain (acute) (chronic): Secondary | ICD-10-CM

## 2022-12-22 DIAGNOSIS — Z515 Encounter for palliative care: Secondary | ICD-10-CM

## 2022-12-22 DIAGNOSIS — Z170 Estrogen receptor positive status [ER+]: Secondary | ICD-10-CM

## 2022-12-22 MED ORDER — OXYCODONE-ACETAMINOPHEN 7.5-325 MG PO TABS
1.0000 | ORAL_TABLET | ORAL | 0 refills | Status: DC | PRN
Start: 2022-12-22 — End: 2023-01-26

## 2022-12-25 ENCOUNTER — Telehealth: Payer: Self-pay

## 2022-12-25 ENCOUNTER — Telehealth: Payer: Self-pay | Admitting: *Deleted

## 2022-12-25 DIAGNOSIS — J45909 Unspecified asthma, uncomplicated: Secondary | ICD-10-CM | POA: Diagnosis not present

## 2022-12-25 DIAGNOSIS — G629 Polyneuropathy, unspecified: Secondary | ICD-10-CM | POA: Diagnosis not present

## 2022-12-25 DIAGNOSIS — F319 Bipolar disorder, unspecified: Secondary | ICD-10-CM | POA: Diagnosis not present

## 2022-12-25 DIAGNOSIS — F419 Anxiety disorder, unspecified: Secondary | ICD-10-CM | POA: Diagnosis not present

## 2022-12-25 DIAGNOSIS — K589 Irritable bowel syndrome without diarrhea: Secondary | ICD-10-CM | POA: Diagnosis not present

## 2022-12-25 DIAGNOSIS — K760 Fatty (change of) liver, not elsewhere classified: Secondary | ICD-10-CM | POA: Diagnosis not present

## 2022-12-25 DIAGNOSIS — I480 Paroxysmal atrial fibrillation: Secondary | ICD-10-CM | POA: Diagnosis not present

## 2022-12-25 DIAGNOSIS — I700 Atherosclerosis of aorta: Secondary | ICD-10-CM | POA: Diagnosis not present

## 2022-12-25 DIAGNOSIS — C50411 Malignant neoplasm of upper-outer quadrant of right female breast: Secondary | ICD-10-CM | POA: Diagnosis not present

## 2022-12-25 DIAGNOSIS — G4730 Sleep apnea, unspecified: Secondary | ICD-10-CM | POA: Diagnosis not present

## 2022-12-25 DIAGNOSIS — M80051D Age-related osteoporosis with current pathological fracture, right femur, subsequent encounter for fracture with routine healing: Secondary | ICD-10-CM | POA: Diagnosis not present

## 2022-12-25 DIAGNOSIS — Z7901 Long term (current) use of anticoagulants: Secondary | ICD-10-CM | POA: Diagnosis not present

## 2022-12-25 DIAGNOSIS — M545 Low back pain, unspecified: Secondary | ICD-10-CM | POA: Diagnosis not present

## 2022-12-25 DIAGNOSIS — G8929 Other chronic pain: Secondary | ICD-10-CM | POA: Diagnosis not present

## 2022-12-25 DIAGNOSIS — M797 Fibromyalgia: Secondary | ICD-10-CM | POA: Diagnosis not present

## 2022-12-25 DIAGNOSIS — Z791 Long term (current) use of non-steroidal anti-inflammatories (NSAID): Secondary | ICD-10-CM | POA: Diagnosis not present

## 2022-12-25 NOTE — Telephone Encounter (Signed)
Rn was able to get in contact with pt, pt reported starting Ibrance on 6/11, and taking it through 6/12, she developed a rash on her legs, worsening nausea, new shortness of breath, and new pain in inhalation and exhalation. RN discussed going to the ED for further evaluation of new SOB and pain, pt declined stating that she "has therapy today and can't miss it" and reporting that if she "feels any worse or can't breath [she] will go to the ED." This RN again discussed the importance of being evaluated for a new symptom pt again declined at this time. Call then transferred to [facility], RN with Dr.Iruku for further discussion of Ibrance management and symptom management. Pt to call back with any questions or concerns.

## 2022-12-25 NOTE — Telephone Encounter (Signed)
This RN spoke with pt per her call to palliative stating "I think I am allergic to the Ibrance".  She states she took her first pill on 6/12 - went to bed and woke with itching on knee and ankle and when she woke the next morning- she noted a rash on her lower leg.  She states she also has had some episodes of SHOB and mild nausea.  She thought the rash may have been poison oak "because my sister and her husband came over and they had it"  She went to her primary care - who thought it was poison oak and prescribed steroid cream.  She has not used the cream - she then stopped the [place] on 6/14 - and states the rash is improving.  Per further inquiry - the [facility] is not acute nor incapacitating and is usually related to activity.  Pt is scheduled for visit with provider this Thursday and will presently stay off the Ibrance at this time.  This message will be forwarded to MD for review and further recommendations.

## 2022-12-25 NOTE — Telephone Encounter (Signed)
Pt called and LVM reporting that she is having an "allergic reaction" to her new medication, voicemail had no further details of symptoms or medication. This RN attempted to call both the patient and husband without an answer for either number. A detailed voicemail was left with instructions to stop medication and go to the ED if symptoms are severe as well as to call the office back. This RN also notified Dr.Iruku's office of the call.

## 2022-12-28 ENCOUNTER — Inpatient Hospital Stay (HOSPITAL_BASED_OUTPATIENT_CLINIC_OR_DEPARTMENT_OTHER): Payer: BC Managed Care – PPO | Admitting: Hematology and Oncology

## 2022-12-28 ENCOUNTER — Inpatient Hospital Stay: Payer: BC Managed Care – PPO

## 2022-12-28 ENCOUNTER — Other Ambulatory Visit: Payer: Self-pay

## 2022-12-28 VITALS — BP 113/44 | HR 56 | Temp 98.8°F | Resp 18 | Wt 217.4 lb

## 2022-12-28 DIAGNOSIS — Z9221 Personal history of antineoplastic chemotherapy: Secondary | ICD-10-CM | POA: Diagnosis not present

## 2022-12-28 DIAGNOSIS — Z170 Estrogen receptor positive status [ER+]: Secondary | ICD-10-CM

## 2022-12-28 DIAGNOSIS — C50411 Malignant neoplasm of upper-outer quadrant of right female breast: Secondary | ICD-10-CM | POA: Diagnosis not present

## 2022-12-28 DIAGNOSIS — Z9011 Acquired absence of right breast and nipple: Secondary | ICD-10-CM | POA: Diagnosis not present

## 2022-12-28 DIAGNOSIS — Z79899 Other long term (current) drug therapy: Secondary | ICD-10-CM | POA: Diagnosis not present

## 2022-12-28 DIAGNOSIS — C7951 Secondary malignant neoplasm of bone: Secondary | ICD-10-CM | POA: Diagnosis not present

## 2022-12-28 DIAGNOSIS — Z923 Personal history of irradiation: Secondary | ICD-10-CM | POA: Diagnosis not present

## 2022-12-28 MED ORDER — FULVESTRANT 250 MG/5ML IM SOSY
500.0000 mg | PREFILLED_SYRINGE | Freq: Once | INTRAMUSCULAR | Status: AC
  Administered 2022-12-28: 500 mg via INTRAMUSCULAR
  Filled 2022-12-28: qty 10

## 2022-12-28 NOTE — Progress Notes (Signed)
[facility] Cancer Follow up:    Doris Pierini, FNP [address] [place] [address]   DIAGNOSIS:  Cancer Staging  Malignant neoplasm of upper-outer quadrant of right breast in female, estrogen receptor positive (HCC) Staging form: Breast, AJCC 8th Edition - Pathologic: Stage IB (pT3, pN53mi, cM0, G2, ER+, PR+, HER2-) - Signed by Loa Socks, NP on 10/02/2018 Multigene prognostic tests performed: MammaPrint Histologic grading system: 3 grade system - Clinical stage from 12/28/2022: Stage IV (rcT2, cN0, cM1, G3, ER+, PR+, HER2-) - Signed by Rachel Moulds, MD on 12/28/2022 Stage prefix: Recurrence Histologic grading system: 3 grade system Laterality: Right Staged by: Pathologist and managing physician Stage used in treatment planning: Yes National guidelines used in treatment planning: Yes Type of national guideline used in treatment planning: NCCN   SUMMARY OF ONCOLOGIC HISTORY: 72 y.o. Doris Smith, [place] woman status post right breast biopsy 08/02/2018 for a clinical T3 N0, stage IIA invasive lobular carcinoma, grade 2, estrogen receptor strongly positive, progesterone receptor 1% positive, with no HER-2 amplification and an MIB-1 of 1%.             (a) CT scan of the head and chest, without contrast 08/23/2018 showed nonspecific 0.4 cm left lower lobe pulmonary nodule, no definitive metastatic disease             (b) bone scan 08/23/2018 shows multiple spinal areas of abnormal uptake, but             (c) total spinal MRI 09/03/2018 finds bone scan findings to be secondary to degenerative disease, no evidence of metastatic disease.   (1) status post right mastectomy and sentinel lymph node sampling 09/16/2018 showing a pT3 pN1(mic), stage IIA-IIIA invasive lobular breast cancer, grade 2, with 1 of 5 sampled lymph nodes involved by micrometastatic deposit, with extra nodular extension; ample margins   (2)  MammaPrint "high risk" suggests a 5-year  metastasis free survival of 93% with chemotherapy, with an absolute chemotherapy benefit in the greater than 12% range   (3) adjuvant radiation 12/31/2018-02/19/2019:  The right chest wall and regional nodes were treated to 50.4 Gy in 28 fractions followed by a 10 Gy boost over 5 fractions.    (4) anastrozole started neoadjuvantly (on 08/14/2018), discontinued 10/14/2018 in preparation for chemotherapy, resumed October 2020             (a) bone density 10/13/2016 showed osteopenia with a lowest score at -2.1             (b) ibandronate/Boniva started January 2021             (c) bone density 11/11/2020 shows a T score of -2.6   (5) Caris testing on mastectomy sample (09/16/2018) showed stable MSI and proficient mismatch repair status, with a low mutational burden; BRCA 1 and 2 were not mutated, PI K3 was not mutated, ER B B2 was not mutated, and AKT 1 was not mutated.  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  Widespread osseous metastasis confirmed on  PET scan that was completed on October 06, 2022.  Right iliac crest biopsy demonstrated metastatic carcinoma consistent with patient's known breast carcinoma.  ER 90% positive, PR 90% positive, Ki67 10%, HER2 negative.  (2) Palliative Radiation 10/23/2022-11/03/2022:  left chest wall, lower T spine, and right proximal hip/pelvis were each treated to 30 GY in 10 fractions.   (3) Faslodex beginning 10/10/2022; Ilda Foil beginning 12/20/2022; Zometa every 12 weeks 11/07/2022  CURRENT THERAPY:  INTERVAL HISTORY:  Doris Smith 72 y.o. female returns for follow-up on treatment with Faslodex and Zometa.  She tells me that she took [place] 1 time and the following day she had some rash on her legs.  This rash has now disappeared but she had some pictures on her phone.  Hence she stopped taking Ibrance and is here for her follow-up.  She did not take any medications, the rash resolved spontaneously.  She continues to complain of severe back pain in the mid to lower back, she has been taking pain medication as prescribed.  She is also getting Faslodex for antiestrogen therapy along with Zometa every 12 weeks.  No change in breathing, mild constipation.  No change in urinary habits.  No new neurological complaints.   Patient Active Problem List   Diagnosis Date Noted   Restless leg syndrome 10/03/2021   Anxiety associated with depression 06/10/2020   Port-A-Cath in place 11/12/2018   Malignant neoplasm of upper-outer quadrant of right breast in female, estrogen receptor positive (HCC) 08/08/2018   Right knee pain 03/01/2015   Aortic atherosclerosis (HCC) 12/28/2014   Obesity (BMI 30-39.9) 04/09/2014   Insomnia due to stress 04/09/2014   Atrial fibrillation (HCC) 10/01/2013   GERD (gastroesophageal reflux disease) 10/01/2013   Bipolar disorder (HCC) 09/27/2007   Essential hypertension 09/27/2007   ALLERGIC RHINITIS 09/27/2007   IRRITABLE BOWEL SYNDROME 09/27/2007   Arthropathy 09/27/2007   DEGENERATIVE DISC DISEASE, LUMBOSACRAL SPINE 09/27/2007    is allergic to iohexol, codeine, palonosetron, prednisone, sulfonamide derivatives, celecoxib, sulfa antibiotics, and vitamin b12.  MEDICAL HISTORY: Past Medical History:  Diagnosis Date   A-fib (HCC) 11/2012   PAF   Anxiety    takes Ativan   Asthma 04/07/2011   dx   Bipolar disorder (HCC)    Cancer (HCC)    Right breast   Depression    Early cataracts, bilateral    Fatty liver    Fibromyalgia    GERD (gastroesophageal reflux disease)    Irritable  bowel syndrome    Mental disorder    dx bipolar   PONV (postoperative nausea and vomiting)    Sleep apnea 04/2011    does not wear CPAP    SURGICAL HISTORY: Past Surgical History:  Procedure Laterality Date   ABDOMINAL HYSTERECTOMY     COLONOSCOPY     DILATION AND CURETTAGE OF UTERUS  1981   abnormal pap   KNEE ARTHROSCOPY Left 2007   x 2   LUMBAR LAMINECTOMY/DECOMPRESSION MICRODISCECTOMY  05/31/2011   Procedure: LUMBAR LAMINECTOMY/DECOMPRESSION MICRODISCECTOMY;  Surgeon: Javier Docker;  Location: [facility];  Service: Orthopedics;  Laterality: Left;  Decompression Lumbar four to five and  Lumbar five to Sacral one on Left  (X-Ray)   MASTECTOMY W/ SENTINEL NODE BIOPSY Right 09/16/2018   Procedure: RIGHT MASTECTOMY WITH RIGHT AXILLARY SENTINEL LYMPH NODE BIOPSY;  Surgeon: Ovidio Kin, MD;  Location: [facility] OR;  Service: General;  Laterality: Right;   PARTIAL HYSTERECTOMY  1982   PORTACATH PLACEMENT Left 10/31/2018   Procedure: INSERTION PORT-A-CATH  WITH ULTRASOUND;  Surgeon: Ovidio Kin, MD;  Location: [facility];  Service: General;  Laterality: Left;   TONSILLECTOMY     as child   TOTAL KNEE ARTHROPLASTY Left 12/18/2005    SOCIAL HISTORY: Social History   Socioeconomic History   Marital status: Married    Spouse name: [name]   Number of children: 2   Years of education: 12   Highest education level: Not on file  Occupational History   Occupation: Disabled  Tobacco Use   Smoking status: Never   Smokeless tobacco: Never  Vaping Use   Vaping Use: Never used  Substance and Sexual Activity   Alcohol use: No   Drug use: No   Sexual activity: Yes    Birth control/protection: Post-menopausal, Surgical  Other Topics Concern   Not on file  Social History Narrative   Tea daily.  Rarely has caffeine    Social Determinants of Health   Financial Resource Strain: Low Risk  (08/18/2022)   Overall Financial Resource Strain (CARDIA)    Difficulty of Paying Living  Expenses: Not hard at all  Food Insecurity: Unknown (08/18/2022)   Hunger Vital Sign    Worried About Running Out of Food in the Last Year: Patient declined    Ran Out of Food in the Last Year: Never true  Transportation Needs: No Transportation Needs (08/18/2022)   PRAPARE - Administrator, Civil Service (Medical): No    Lack of Transportation (Non-Medical): No  Physical Activity: Inactive (08/18/2022)   Exercise Vital Sign    Days of Exercise per Week: 0 days    Minutes of Exercise per Session: 0 min  Stress: No Stress Concern Present (08/18/2022)   [employer] of Occupational Health - Occupational Stress Questionnaire    Feeling of Stress : Not at all  Social Connections: Moderately Integrated (08/18/2022)   Social Connection and Isolation Panel [NHANES]    Frequency of Communication with Friends and Family: More than three times a week    Frequency of Social Gatherings with Friends and Family: More than three times a week    Attends Religious Services: More than 4 times per year    Active Member of [employer] or Organizations: No    Attends Banker Meetings: Never    Marital Status: Married  Catering manager Violence: Not At Risk (08/18/2022)   Humiliation, Afraid, Rape, and Kick questionnaire    Fear of Current or Ex-Partner: No    Emotionally Abused: No    Physically Abused: No    Sexually Abused: No    FAMILY HISTORY: Family History  Problem Relation Age of Onset   Anesthesia problems Mother    Heart disease Mother        CHF, atrial fib   Heart failure Mother    Anesthesia problems Sister    Colon polyps Father    Heart disease Father        "Fluid around the heart"   Hypertension Sister    Breast cancer Maternal Aunt    Colon cancer Maternal Aunt    Prostate cancer Neg Hx    Ovarian cancer Neg Hx     Review of Systems  Constitutional:  Positive for fatigue. Negative for appetite change, chills, fever and unexpected weight change.  HENT:    Negative for hearing loss, lump/mass and trouble swallowing.   Eyes:  Negative for eye problems and icterus.  Respiratory:  Negative for chest tightness, cough and shortness of breath.  Cardiovascular:  Negative for chest pain, leg swelling and palpitations.  Gastrointestinal:  Positive for nausea. Negative for abdominal distention, abdominal pain, constipation, diarrhea and vomiting.  Endocrine: Negative for hot flashes.  Genitourinary:  Negative for difficulty urinating.   Musculoskeletal:  Positive for back pain. Negative for arthralgias.  Skin:  Positive for rash. Negative for itching.  Neurological:  Negative for dizziness, extremity weakness, headaches and numbness.  Hematological:  Negative for adenopathy. Does not bruise/bleed easily.  Psychiatric/Behavioral:  Negative for depression. The patient is not nervous/anxious.       PHYSICAL EXAMINATION     Vitals:   12/28/22 1205  BP: (!) 113/44  Pulse: (!) 56  Resp: 18  Temp: 98.8 F (37.1 C)  SpO2: 97%    Physical Exam Constitutional:      General: She is not in acute distress.    Appearance: Normal appearance. She is not toxic-appearing.     Comments: Examined in wheelchair  HENT:     Head: Normocephalic and atraumatic.     Comments: + lip smacking     Mouth/Throat:     Mouth: Mucous membranes are moist.     Pharynx: Oropharynx is clear. No oropharyngeal exudate or posterior oropharyngeal erythema.  Eyes:     General: No scleral icterus. Cardiovascular:     Rate and Rhythm: Normal rate and regular rhythm.     Pulses: Normal pulses.     Heart sounds: Normal heart sounds.  Pulmonary:     Effort: Pulmonary effort is normal.     Breath sounds: Normal breath sounds.  Abdominal:     General: Abdomen is flat. Bowel sounds are normal. There is no distension.     Palpations: Abdomen is soft.     Tenderness: There is no abdominal tenderness.  Musculoskeletal:        General: No swelling.     Cervical back: Neck  supple.  Lymphadenopathy:     Cervical: No cervical adenopathy.  Skin:    General: Skin is warm and dry.     Findings: No rash.  Neurological:     General: No focal deficit present.     Mental Status: She is alert.  Psychiatric:        Mood and Affect: Mood normal.        Behavior: Behavior normal.     LABORATORY DATA:  CBC    Component Value Date/Time   WBC 5.5 11/30/2022 1339   RBC 2.93 (L) 11/30/2022 1339   HGB 9.1 (L) 11/30/2022 1339   HGB 12.0 06/05/2022 1024   HCT 28.2 (L) 11/30/2022 1339   HCT 37.4 06/05/2022 1024   PLT 163 11/30/2022 1339   PLT 251 06/05/2022 1024   MCV 96.2 11/30/2022 1339   MCV 86 06/05/2022 1024   MCH 31.1 11/30/2022 1339   MCHC 32.3 11/30/2022 1339   RDW 18.7 (H) 11/30/2022 1339   RDW 15.5 (H) 06/05/2022 1024   LYMPHSABS 0.4 (L) 11/30/2022 1339   LYMPHSABS 0.9 06/05/2022 1024   MONOABS 0.6 11/30/2022 1339   EOSABS 0.1 11/30/2022 1339   EOSABS 0.1 06/05/2022 1024   BASOSABS 0.0 11/30/2022 1339   BASOSABS 0.1 06/05/2022 1024    CMP     Component Value Date/Time   NA 141 11/30/2022 1339   NA 145 (H) 06/05/2022 1024   K 3.4 (L) 11/30/2022 1339   CL 103 11/30/2022 1339   CO2 28 11/30/2022 1339   GLUCOSE 168 (H) 11/30/2022 1339   BUN 6 (L)  11/30/2022 1339   BUN 17 06/05/2022 1024   CREATININE 0.80 11/30/2022 1339   CREATININE 1.07 (H) 08/25/2022 0921   CALCIUM 8.7 (L) 11/30/2022 1339   PROT 6.3 (L) 11/30/2022 1339   PROT 6.6 06/05/2022 1024   ALBUMIN 3.5 11/30/2022 1339   ALBUMIN 4.0 06/05/2022 1024   AST 23 11/30/2022 1339   AST 22 08/25/2022 0921   ALT 13 11/30/2022 1339   ALT 26 08/25/2022 0921   ALKPHOS 232 (H) 11/30/2022 1339   BILITOT 0.7 11/30/2022 1339   BILITOT 0.5 08/25/2022 0921   GFRNONAA >60 11/30/2022 1339   GFRNONAA 56 (L) 08/25/2022 0921   GFRAA 81 02/03/2020 1049   GFRAA >60 12/19/2018 1434      ASSESSMENT and THERAPY PLAN:   Malignant neoplasm of upper-outer quadrant of right breast in female,  estrogen receptor positive (HCC) Doris Smith is a 72 year old woman with metastatic breast cancer here today for follow-up and evaluation.  Metastatic breast cancer to the bone: She is tolerating Faslodex and Zometa well she will continue this.  He tried [place] 1 day and had some skin rash on both her ankles which has spontaneously resolved.  The rash lasted for 1 day.  She denies any generalized rash.  I am not quite sure if this rash is actually related to Ibrance hence I have asked her to try it again.  If she of course happens to have the rash recur then we will have to try and switch her to a different CDK 4 6 inhibitor.   Cancer related pain: She still complains of pain 10 out of 10 however appears to be in no acute distress today.  She will continue to follow-up with palliative care for pain management Tardive dyskinesia: This is a chronic complaint, she had this even before we started any of her medications.  I recommended that she return to clinic in 4 weeks or sooner as needed.  She will go ahead and attempt Ibrance again.   All questions were answered. The patient knows to call the clinic with any problems, questions or concerns. We can certainly see the patient much sooner if necessary.  Total encounter time:30 minutes*in face-to-face visit time, chart review, lab review, care coordination, order entry, and documentation of the encounter time.  *Total Encounter Time as defined by the Centers for Medicare and Medicaid Services includes, in addition to the face-to-face time of a patient visit (documented in the note above) non-face-to-face time: obtaining and reviewing outside history, ordering and reviewing medications, tests or procedures, care coordination (communications with other health care professionals or caregivers) and documentation in the medical record.

## 2022-12-28 NOTE — Assessment & Plan Note (Signed)
Doris Smith is a 72 year old woman with metastatic breast cancer here today for follow-up and evaluation.  Metastatic breast cancer to the bone: She is tolerating Faslodex and Zometa well she will continue this.  He tried [place] 1 day and had some skin rash on both her ankles which has spontaneously resolved.  The rash lasted for 1 day.  She denies any generalized rash.  I am not quite sure if this rash is actually related to Ibrance hence I have asked her to try it again.  If she of course happens to have the rash recur then we will have to try and switch her to a different CDK 4 6 inhibitor.   Cancer related pain: She still complains of pain 10 out of 10 however appears to be in no acute distress today.  She will continue to follow-up with palliative care for pain management Tardive dyskinesia: This is a chronic complaint, she had this even before we started any of her medications.  I recommended that she return to clinic in 4 weeks or sooner as needed.  She will go ahead and attempt Ibrance again.

## 2022-12-29 ENCOUNTER — Encounter: Payer: Self-pay | Admitting: Nurse Practitioner

## 2022-12-29 ENCOUNTER — Ambulatory Visit (INDEPENDENT_AMBULATORY_CARE_PROVIDER_SITE_OTHER): Payer: BC Managed Care – PPO | Admitting: Nurse Practitioner

## 2022-12-29 VITALS — BP 110/76 | HR 51 | Temp 97.7°F | Resp 20 | Ht 64.0 in | Wt 217.0 lb

## 2022-12-29 DIAGNOSIS — K582 Mixed irritable bowel syndrome: Secondary | ICD-10-CM

## 2022-12-29 DIAGNOSIS — K219 Gastro-esophageal reflux disease without esophagitis: Secondary | ICD-10-CM | POA: Diagnosis not present

## 2022-12-29 DIAGNOSIS — F418 Other specified anxiety disorders: Secondary | ICD-10-CM

## 2022-12-29 DIAGNOSIS — F319 Bipolar disorder, unspecified: Secondary | ICD-10-CM

## 2022-12-29 DIAGNOSIS — I10 Essential (primary) hypertension: Secondary | ICD-10-CM | POA: Diagnosis not present

## 2022-12-29 DIAGNOSIS — E669 Obesity, unspecified: Secondary | ICD-10-CM

## 2022-12-29 DIAGNOSIS — C50411 Malignant neoplasm of upper-outer quadrant of right female breast: Secondary | ICD-10-CM

## 2022-12-29 DIAGNOSIS — M5137 Other intervertebral disc degeneration, lumbosacral region: Secondary | ICD-10-CM

## 2022-12-29 DIAGNOSIS — I700 Atherosclerosis of aorta: Secondary | ICD-10-CM | POA: Diagnosis not present

## 2022-12-29 DIAGNOSIS — I480 Paroxysmal atrial fibrillation: Secondary | ICD-10-CM | POA: Diagnosis not present

## 2022-12-29 DIAGNOSIS — G2581 Restless legs syndrome: Secondary | ICD-10-CM

## 2022-12-29 DIAGNOSIS — F5102 Adjustment insomnia: Secondary | ICD-10-CM

## 2022-12-29 DIAGNOSIS — R600 Localized edema: Secondary | ICD-10-CM | POA: Insufficient documentation

## 2022-12-29 MED ORDER — ESOMEPRAZOLE MAGNESIUM 40 MG PO CPDR
40.0000 mg | DELAYED_RELEASE_CAPSULE | Freq: Every day | ORAL | 0 refills | Status: DC
Start: 2022-12-29 — End: 2023-07-17

## 2022-12-29 MED ORDER — PRAMIPEXOLE DIHYDROCHLORIDE 1 MG PO TABS: 1.0000 mg | ORAL_TABLET | Freq: Three times a day (TID) | ORAL | 0 refills | Status: DC

## 2022-12-29 MED ORDER — FUROSEMIDE 20 MG PO TABS
20.0000 mg | ORAL_TABLET | Freq: Every day | ORAL | 1 refills | Status: DC
Start: 2022-12-29 — End: 2023-08-16

## 2022-12-29 MED ORDER — APIXABAN 5 MG PO TABS: 5.0000 mg | ORAL_TABLET | Freq: Two times a day (BID) | ORAL | 1 refills | Status: DC

## 2022-12-29 MED ORDER — METOPROLOL TARTRATE 25 MG PO TABS
25.0000 mg | ORAL_TABLET | Freq: Two times a day (BID) | ORAL | 1 refills | Status: DC
Start: 2022-12-29 — End: 2023-08-16

## 2022-12-29 MED ORDER — GABAPENTIN 300 MG PO CAPS: 300.0000 mg | ORAL_CAPSULE | Freq: Three times a day (TID) | ORAL | 5 refills | Status: DC

## 2022-12-29 NOTE — Progress Notes (Signed)
Subjective:    Patient ID: Doris Smith, female    DOB: [birth year], 72 y.o.   MRN: [id number]   Chief Complaint: medical management of chronic issues     HPI:  Doris Smith is a 72 y.o. who identifies as a female who was assigned female at birth.   Social history: Lives with: husband Work history: retired   Water engineer in today for follow up of the following chronic medical issues:  1. Essential hypertension No c/o chest pain, sob or headache. Does not check blood pressure at home. BP Readings from Last 3 Encounters:  12/28/22 (!) 113/44  12/14/22 121/63  11/30/22 136/67     2. Aortic atherosclerosis (HCC) 3. Paroxysmal atrial fibrillation (HCC) Has not seen cardiology in over a year. Is on eliquis BID with no bleeding issues.  4. Gastroesophageal reflux disease without esophagitis Is on nexium daily nad seems to be hleping with symptoms.  5. Irritable bowel syndrome with both constipation and diarrhea Seems to be stable right now.   6. Anxiety associated with depression 7. Bipolar affective disorder, remission status unspecified (HCC) Is on combination of viibryd and Latuda. She says she is doing well. Has some "down" days due to her cancer, but all in all is doing well.    12/29/2022   11:55 AM 10/12/2022   10:27 AM 08/18/2022    2:29 PM  Depression screen PHQ 2/9  Decreased Interest 2 2 0  Down, Depressed, Hopeless 2 2 0  PHQ - 2 Score 4 4 0  Altered sleeping 1 2 0  Tired, decreased energy 2 2 0  Change in appetite 1 2 0  Feeling bad or failure about yourself  1 0 0  Trouble concentrating 1 1 0  Moving slowly or fidgety/restless 1 0 0  Suicidal thoughts 0 0 0  PHQ-9 Score 11 11 0  Difficult doing work/chores Somewhat difficult Somewhat difficult Not difficult at all      12/29/2022   11:56 AM 10/12/2022   10:30 AM 08/04/2022    1:54 PM 07/05/2022   11:20 AM  GAD 7 : Generalized Anxiety Score  Nervous, Anxious, on Edge 2 2 2 3   Control/stop  worrying 1 1 1 2   Worry too much - different things 2 1 1 2   Trouble relaxing 3 3 2 3   Restless 3 2 1 3   Easily annoyed or irritable 2 1 1 2   Afraid - awful might happen 0 0 0 0  Total GAD 7 Score 13 10 8 15   Anxiety Difficulty Not difficult at all Somewhat difficult Not difficult at all Somewhat difficult       8. Insomnia due to stress Is sleeping well most nights  9. Restless leg syndrome Is on mirapex which helps but does not 100% treat symptoms.  10. Obesity (BMI 30-39.9) No recent weight changes Wt Readings from Last 3 Encounters:  12/29/22 217 lb (98.4 kg)  12/28/22 217 lb 6.4 oz (98.6 kg)  12/14/22 222 lb 12.8 oz (101.1 kg)   BMI Readings from Last 3 Encounters:  12/29/22 37.25 kg/m  12/28/22 37.32 kg/m  12/14/22 38.24 kg/m     11. Malignant neoplasm of upper-outer quadrant of right breast in female, estrogen receptor positive (HCC) Has metastasis to bone. Is on Faslodex and zometa. She is going to follow up with palliative care for pain management. She has follow  up in 4 weeks.  12. DDD- umbar disc disease Is on neurontin which helps some  13. Peripheral edema Is on lasix daily which helps. Still has some swelling by the end of the day. Will resolve at night usually.  New complaints: None today  Allergies  Allergen Reactions   Iohexol     "over 20 years ago" had reaction "hurting in arm and heart"-Done in [place], [place].Marland Kitchenphysician stopped CT at that time.   Codeine Other (See Comments)    Makes feel like she is wild    Palonosetron Other (See Comments)    headache   Prednisone Other (See Comments)    Makes me very irritable   Sulfonamide Derivatives Other (See Comments)    Upset stomach   Celecoxib Nausea And Vomiting   Sulfa Antibiotics Nausea And Vomiting   Vitamin B12 Rash   Outpatient Encounter Medications as of 12/29/2022  Medication Sig   apixaban (ELIQUIS) 5 MG TABS tablet Take 1 tablet (5 mg total) by mouth 2 (two) times daily.    clotrimazole (MYCELEX) 10 MG troche Take 1 tablet (10 mg total) by mouth 5 (five) times daily.   clotrimazole-betamethasone (LOTRISONE) cream APPLY  CREAM TOPICALLY TWICE DAILY   esomeprazole (NEXIUM) 40 MG capsule Take 1 capsule by mouth once daily   fluticasone (FLONASE) 50 MCG/ACT nasal spray Place 2 sprays into both nostrils daily.   furosemide (LASIX) 20 MG tablet Take 1 tablet (20 mg total) by mouth daily.   gabapentin (NEURONTIN) 300 MG capsule Take 1 capsule (300 mg total) by mouth 3 (three) times daily.   Incontinence Supply Disposable (CERTAINTY FITTED BRIEFS XL) MISC 1 Package by Does not apply route as needed.   lidocaine (LIDODERM) 5 % Place 2 patches onto the skin daily. Remove & Discard patch within 12 hours or as directed by MD   loratadine (CLARITIN) 10 MG tablet Take 10 mg by mouth daily.   Lurasidone HCl (LATUDA) 60 MG TABS TAKE 1 TABLET BY MOUTH ONCE DAILY   magic mouthwash (nystatin, diphenhydrAMINE, alum & mag hydroxide) suspension mixture Swish and swallow 5 mLs 4 (four) times daily as needed for mouth pain.   metoprolol tartrate (LOPRESSOR) 25 MG tablet Take 1 tablet (25 mg total) by mouth 2 (two) times daily.   midodrine (PROAMATINE) 2.5 MG tablet Take 1 tablet (2.5 mg total) by mouth 3 (three) times daily with meals.   ondansetron (ZOFRAN) 8 MG tablet Take 1 tablet (8 mg total) by mouth every 8 (eight) hours as needed for nausea.   oxyCODONE (OXYCONTIN) 10 mg 12 hr tablet Take 1 tablet (10 mg total) by mouth every 12 (twelve) hours.   oxyCODONE-acetaminophen (PERCOCET) 7.5-325 MG tablet Take 1 tablet by mouth every 4 (four) hours as needed for severe pain.   palbociclib (IBRANCE) 100 MG tablet Take 1 tablet (100 mg total) by mouth daily. Take for 21 days on, 7 days off, repeat every 28 days. (Patient not taking: Reported on 12/14/2022)   pramipexole (MIRAPEX) 1 MG tablet TAKE 1 TABLET BY MOUTH THREE TIMES DAILY   prochlorperazine (COMPAZINE) 10 MG tablet Take 1 tablet (10 mg  total) by mouth every 8 (eight) hours as needed for nausea or vomiting.   tizanidine (ZANAFLEX) 2 MG capsule Take 2 mg by mouth 3 (three) times daily as needed.   triamcinolone (KENALOG) 0.1 % Apply 1 application. topically 2 (two) times daily.   Vilazodone HCl (VIIBRYD) 40 MG TABS Take 1 tablet (40 mg total) by mouth daily.   Facility-Administered Encounter Medications as of 12/29/2022  Medication   heparin lock flush 100 unit/mL  sodium chloride flush (NS) 0.9 % injection 10 mL    Past Surgical History:  Procedure Laterality Date   ABDOMINAL HYSTERECTOMY     COLONOSCOPY     DILATION AND CURETTAGE OF UTERUS  1981   abnormal pap   KNEE ARTHROSCOPY Left 2007   x 2   LUMBAR LAMINECTOMY/DECOMPRESSION MICRODISCECTOMY  05/31/2011   Procedure: LUMBAR LAMINECTOMY/DECOMPRESSION MICRODISCECTOMY;  Surgeon: Javier Docker;  Location: [facility];  Service: Orthopedics;  Laterality: Left;  Decompression Lumbar four to five and  Lumbar five to Sacral one on Left  (X-Ray)   MASTECTOMY W/ SENTINEL NODE BIOPSY Right 09/16/2018   Procedure: RIGHT MASTECTOMY WITH RIGHT AXILLARY SENTINEL LYMPH NODE BIOPSY;  Surgeon: Ovidio Kin, MD;  Location: [facility] OR;  Service: General;  Laterality: Right;   PARTIAL HYSTERECTOMY  1982   PORTACATH PLACEMENT Left 10/31/2018   Procedure: INSERTION PORT-A-CATH WITH ULTRASOUND;  Surgeon: Ovidio Kin, MD;  Location: [facility];  Service: General;  Laterality: Left;   TONSILLECTOMY     as child   TOTAL KNEE ARTHROPLASTY Left 12/18/2005    Family History  Problem Relation Age of Onset   Anesthesia problems Mother    Heart disease Mother        CHF, atrial fib   Heart failure Mother    Anesthesia problems Sister    Colon polyps Father    Heart disease Father        "Fluid around the heart"   Hypertension Sister    Breast cancer Maternal Aunt    Colon cancer Maternal Aunt    Prostate cancer Neg Hx    Ovarian cancer Neg Hx       Controlled substance  contract: n/a     Review of Systems  Constitutional:  Negative for diaphoresis.  Eyes:  Negative for pain.  Respiratory:  Negative for shortness of breath.   Cardiovascular:  Negative for chest pain, palpitations and leg swelling.  Gastrointestinal:  Negative for abdominal pain.  Endocrine: Negative for polydipsia.  Musculoskeletal:  Positive for back pain and myalgias.  Skin:  Negative for rash.  Neurological:  Negative for dizziness, weakness and headaches.  Hematological:  Does not bruise/bleed easily.  All other systems reviewed and are negative.      Objective:   Physical Exam Vitals and nursing note reviewed.  Constitutional:      General: She is not in acute distress.    Appearance: Normal appearance. She is well-developed.  HENT:     Head: Normocephalic.     Right Ear: Tympanic membrane normal.     Left Ear: Tympanic membrane normal.     Nose: Nose normal.     Mouth/Throat:     Mouth: Mucous membranes are moist.  Eyes:     Pupils: Pupils are equal, round, and reactive to light.  Neck:     Vascular: No carotid bruit or JVD.  Cardiovascular:     Rate and Rhythm: Normal rate. Rhythm irregular.     Heart sounds: Normal heart sounds.  Pulmonary:     Effort: Pulmonary effort is normal. No respiratory distress.     Breath sounds: Normal breath sounds. No wheezing or rales.  Chest:     Chest wall: No tenderness.  Abdominal:     General: Bowel sounds are normal. There is no distension or abdominal bruit.     Palpations: Abdomen is soft. There is no hepatomegaly, splenomegaly, mass or pulsatile mass.     Tenderness: There is no abdominal  tenderness.  Musculoskeletal:        General: Normal range of motion.     Cervical back: Normal range of motion and neck supple.     Comments: Not able to get on exam table due to pain in back  Lymphadenopathy:     Cervical: No cervical adenopathy.  Skin:    General: Skin is warm and dry.  Neurological:     Mental Status: She  is alert and oriented to person, place, and time.     Deep Tendon Reflexes: Reflexes are normal and symmetric.  Psychiatric:        Behavior: Behavior normal.        Thought Content: Thought content normal.        Judgment: Judgment normal.    BP 110/76   Pulse (!) 51   Temp 97.7 F (36.5 C) (Temporal)   Resp 20   Ht 5\' 4"  (1.626 m)   Wt 217 lb (98.4 kg)   SpO2 97%   BMI 37.25 kg/m           Assessment & Plan:   Mylin Gignac Maalouf comes in today with chief complaint of Medical Management of Chronic Issues   Diagnosis and orders addressed:  1. Essential hypertension Low sodium diet  2. Aortic atherosclerosis (HCC) 3. Paroxysmal atrial fibrillation (HCC) Needs to follow up with cariology - apixaban (ELIQUIS) 5 MG TABS tablet; Take 1 tablet (5 mg total) by mouth 2 (two) times daily.  Dispense: 180 tablet; Refill: 1 - metoprolol tartrate (LOPRESSOR) 25 MG tablet; Take 1 tablet (25 mg total) by mouth 2 (two) times daily.  Dispense: 180 tablet; Refill: 1  4. Gastroesophageal reflux disease without esophagitis Avoid spicy foods Do not eat 2 hours prior to bedtime - esomeprazole (NEXIUM) 40 MG capsule; Take 1 capsule (40 mg total) by mouth daily.  Dispense: 90 capsule; Refill: 0  5. Irritable bowel syndrome with both constipation and diarrhea Watch diet to prevent flare ups  6. Anxiety associated with depression 7. Bipolar affective disorder, remission status unspecified (HCC) Stress management  8. Insomnia due to stress Bedtime routine  9. Restless leg syndrome Keep legs warm at night - pramipexole (MIRAPEX) 1 MG tablet; Take 1 tablet (1 mg total) by mouth 3 (three) times daily.  Dispense: 90 tablet; Refill: 0  10. Obesity (BMI 30-39.9) Discussed diet and exercise for person with BMI >25 Will recheck weight in 3-6 months   11. Malignant neoplasm of upper-outer quadrant of right breast in female, estrogen receptor positive (HCC) Keep oncology followup  12.  DEGENERATIVE DISC DISEASE, LUMBOSACRAL SPINE - gabapentin (NEURONTIN) 300 MG capsule; Take 1 capsule (300 mg total) by mouth 3 (three) times daily.  Dispense: 90 capsule; Refill: 5  13. Peripheral edema Elevate legs when sitting - furosemide (LASIX) 20 MG tablet; Take 1 tablet (20 mg total) by mouth daily.  Dispense: 90 tablet; Refill: 1   Labs pending Health Maintenance reviewed Diet and exercise encouraged  Follow up plan: 6 months   [name] Daphine Deutscher, FNP

## 2022-12-29 NOTE — Patient Instructions (Signed)
Fall Prevention in the Home, Adult Falls can cause injuries and can happen to people of all ages. There are many things you can do to make your home safer and to help prevent falls. What actions can I take to prevent falls? General information Use good lighting in all rooms. Make sure to: Replace any light bulbs that burn out. Turn on the lights in dark areas and use night-lights. Keep items that you use often in easy-to-reach places. Lower the shelves around your home if needed. Move furniture so that there are clear paths around it. Do not use throw rugs or other things on the floor that can make you trip. If any of your floors are uneven, fix them. Add color or contrast paint or tape to clearly mark and help you see: Grab bars or handrails. First and last steps of staircases. Where the edge of each step is. If you use a ladder or stepladder: Make sure that it is fully opened. Do not climb a closed ladder. Make sure the sides of the ladder are locked in place. Have someone hold the ladder while you use it. Know where your pets are as you move through your home. What can I do in the bathroom?     Keep the floor dry. Clean up any water on the floor right away. Remove soap buildup in the bathtub or shower. Buildup makes bathtubs and showers slippery. Use non-skid mats or decals on the floor of the bathtub or shower. Attach bath mats securely with double-sided, non-slip rug tape. If you need to sit down in the shower, use a non-slip stool. Install grab bars by the toilet and in the bathtub and shower. Do not use towel bars as grab bars. What can I do in the bedroom? Make sure that you have a light by your bed that is easy to reach. Do not use any sheets or blankets on your bed that hang to the floor. Have a firm chair or bench with side arms that you can use for support when you get dressed. What can I do in the kitchen? Clean up any spills right away. If you need to reach something  above you, use a step stool with a grab bar. Keep electrical cords out of the way. Do not use floor polish or wax that makes floors slippery. What can I do with my stairs? Do not leave anything on the stairs. Make sure that you have a light switch at the top and the bottom of the stairs. Make sure that there are handrails on both sides of the stairs. Fix handrails that are broken or loose. Install non-slip stair treads on all your stairs if they do not have carpet. Avoid having throw rugs at the top or bottom of the stairs. Choose a carpet that does not hide the edge of the steps on the stairs. Make sure that the carpet is firmly attached to the stairs. Fix carpet that is loose or worn. What can I do on the outside of my home? Use bright outdoor lighting. Fix the edges of walkways and driveways and fix any cracks. Clear paths of anything that can make you trip, such as tools or rocks. Add color or contrast paint or tape to clearly mark and help you see anything that might make you trip as you walk through a door, such as a raised step or threshold. Trim any bushes or trees on paths to your home. Check to see if handrails are loose   or broken and that both sides of all steps have handrails. Install guardrails along the edges of any raised decks and porches. Have leaves, snow, or ice cleared regularly. Use sand, salt, or ice melter on paths if you live where there is ice and snow during the winter. Clean up any spills in your garage right away. This includes grease or oil spills. What other actions can I take? Review your medicines with your doctor. Some medicines can cause dizziness or changes in blood pressure, which increase your risk of falling. Wear shoes that: Have a low heel. Do not wear high heels. Have rubber bottoms and are closed at the toe. Feel good on your feet and fit well. Use tools that help you move around if needed. These include: Canes. Walkers. Scooters. Crutches. Ask  your doctor what else you can do to help prevent falls. This may include seeing a physical therapist to learn to do exercises to move better and get stronger. Where to find more information Centers for Disease Control and Prevention, [employer]: cdc.gov National Institute on Aging: nia.nih.gov National Institute on Aging: nia.nih.gov Contact a doctor if: You are afraid of falling at home. You feel weak, drowsy, or dizzy at home. You fall at home. Get help right away if you: Lose consciousness or have trouble moving after a fall. Have a fall that causes a head injury. These symptoms may be an emergency. Get help right away. Call 911. Do not wait to see if the symptoms will go away. Do not drive yourself to the hospital. This information is not intended to replace advice given to you by your health care provider. Make sure you discuss any questions you have with your health care provider. Document Revised: 02/27/2022 Document Reviewed: 02/27/2022 Elsevier Patient Education  2024 Elsevier Inc.  

## 2022-12-30 DIAGNOSIS — N390 Urinary tract infection, site not specified: Secondary | ICD-10-CM | POA: Diagnosis not present

## 2023-01-01 ENCOUNTER — Other Ambulatory Visit: Payer: Self-pay

## 2023-01-01 DIAGNOSIS — G629 Polyneuropathy, unspecified: Secondary | ICD-10-CM | POA: Diagnosis not present

## 2023-01-01 DIAGNOSIS — K760 Fatty (change of) liver, not elsewhere classified: Secondary | ICD-10-CM | POA: Diagnosis not present

## 2023-01-01 DIAGNOSIS — J45909 Unspecified asthma, uncomplicated: Secondary | ICD-10-CM | POA: Diagnosis not present

## 2023-01-01 DIAGNOSIS — I700 Atherosclerosis of aorta: Secondary | ICD-10-CM | POA: Diagnosis not present

## 2023-01-01 DIAGNOSIS — M797 Fibromyalgia: Secondary | ICD-10-CM | POA: Diagnosis not present

## 2023-01-01 DIAGNOSIS — F319 Bipolar disorder, unspecified: Secondary | ICD-10-CM | POA: Diagnosis not present

## 2023-01-01 DIAGNOSIS — G4730 Sleep apnea, unspecified: Secondary | ICD-10-CM | POA: Diagnosis not present

## 2023-01-01 DIAGNOSIS — Z791 Long term (current) use of non-steroidal anti-inflammatories (NSAID): Secondary | ICD-10-CM | POA: Diagnosis not present

## 2023-01-01 DIAGNOSIS — K589 Irritable bowel syndrome without diarrhea: Secondary | ICD-10-CM | POA: Diagnosis not present

## 2023-01-01 DIAGNOSIS — C50411 Malignant neoplasm of upper-outer quadrant of right female breast: Secondary | ICD-10-CM | POA: Diagnosis not present

## 2023-01-01 DIAGNOSIS — Z780 Asymptomatic menopausal state: Secondary | ICD-10-CM

## 2023-01-01 DIAGNOSIS — I480 Paroxysmal atrial fibrillation: Secondary | ICD-10-CM | POA: Diagnosis not present

## 2023-01-01 DIAGNOSIS — Z7901 Long term (current) use of anticoagulants: Secondary | ICD-10-CM | POA: Diagnosis not present

## 2023-01-01 DIAGNOSIS — M80051D Age-related osteoporosis with current pathological fracture, right femur, subsequent encounter for fracture with routine healing: Secondary | ICD-10-CM | POA: Diagnosis not present

## 2023-01-01 DIAGNOSIS — F419 Anxiety disorder, unspecified: Secondary | ICD-10-CM | POA: Diagnosis not present

## 2023-01-01 DIAGNOSIS — M545 Low back pain, unspecified: Secondary | ICD-10-CM | POA: Diagnosis not present

## 2023-01-01 DIAGNOSIS — G8929 Other chronic pain: Secondary | ICD-10-CM | POA: Diagnosis not present

## 2023-01-01 NOTE — Telephone Encounter (Signed)
Oral Oncology Pharmacist Encounter  Oral oncology is unable to reach patient to discuss new medication, Ibrance. Oral oncology will sign off at this time as patient receives medication through [facility].   Bethel Born, PharmD Hematology/Oncology Clinical Pharmacist [facility] [phone]

## 2023-01-03 ENCOUNTER — Other Ambulatory Visit: Payer: Self-pay | Admitting: *Deleted

## 2023-01-03 ENCOUNTER — Telehealth: Payer: Self-pay | Admitting: *Deleted

## 2023-01-03 NOTE — Telephone Encounter (Signed)
This RN spoke with pt per her call stating " I feel terrible and nothing is working for me "  She states she is having continued nausea with vomiting- with further questions- she has only vomited x 1 but states "really dry heaving".  She states she is using the zofran and compazine as ordered.  Note per discussion at approximately 11 am she was still laying in bed.  She also states her husband had to return to work despite completion of request for extension of his FMLA.  This RN reviewed chart noted pt was seen also by her primary MD for esophagitis with recommendation for use of protonix and not reclining post eating.  This RN returned call to pt to discuss issues per above with her routine for possible issues of eating and reclining.  Obtained VM- message left to return call.  Of note 2nd VM left by pt stating she was informed that her husband has exhausted all of his FMLA and therefore extension cannot granted by his workplace.

## 2023-01-04 ENCOUNTER — Other Ambulatory Visit: Payer: Self-pay

## 2023-01-04 ENCOUNTER — Other Ambulatory Visit: Payer: Self-pay | Admitting: *Deleted

## 2023-01-04 DIAGNOSIS — Z515 Encounter for palliative care: Secondary | ICD-10-CM

## 2023-01-04 DIAGNOSIS — G893 Neoplasm related pain (acute) (chronic): Secondary | ICD-10-CM

## 2023-01-04 DIAGNOSIS — Z170 Estrogen receptor positive status [ER+]: Secondary | ICD-10-CM

## 2023-01-04 MED ORDER — OXYCODONE HCL ER 10 MG PO T12A
10.0000 mg | EXTENDED_RELEASE_TABLET | Freq: Two times a day (BID) | ORAL | 0 refills | Status: DC
Start: 2023-01-05 — End: 2023-01-26

## 2023-01-04 NOTE — Telephone Encounter (Signed)
Pt called for refill, see new order 

## 2023-01-05 ENCOUNTER — Telehealth: Payer: Self-pay

## 2023-01-05 ENCOUNTER — Telehealth: Payer: Self-pay | Admitting: *Deleted

## 2023-01-05 NOTE — Telephone Encounter (Signed)
This RN spoke with pt per her call-   Note pt has called several times with RN's returning calls obtaining pt's VM.  Pt's primary concern is ongoing nausea.  This RN asked about pt's daily regimen of activity which [name] states she wakes up around 7 am and gets up but then depending on how she is feeling she often returns to bed.  This RN discussed issues relating to nausea- including noted gastritis and instructions given by her.  This RN discussed benefit of maintaining a daily regimen that includes not returning to bed during the day. She needs to sit up for at lease 30 minutes post each meal to decrease gastric reflux. She needs to pace herself in her activities and do a dedicated activity daily to increase her endurance. If she needs to rest during the day - she should use a recliner or prop her self up on the sofa.  [name] also asked " how do we know how what you are giving me is what I need ?"  This RN discussed how treatment regimens are indicated by known studies and that we will restage her at some point to verify how she is responding.  [name] also asked about refills and how they are done - "do I call  you and you do them?"  This RN informed pt it depends on the medication ( ie pain med vs anti nausea ) if she looks at the bottle and it has refills she can call and obtain fill.  Refills noted as given on Ibrance with [name] stating she received a request on her phone to refill the [facility] Iv which she responded to have done.  Overall discussion today seemed to be very well understood with pt able to verbalize back instructions and plan.

## 2023-01-05 NOTE — Telephone Encounter (Signed)
Patient called for a medication refill on 01/04/23, medication was sent to pharmacy and pt was called to be notified, no answer then and VM was left.   Patient called again this morning and left a voice mail regarding her pain medications as well as her nausea meds. RN attempted to call back twice, both times there was no answer and this RN left a detailed message and a call back number regarding her refill and how to take N/V meds.

## 2023-01-08 ENCOUNTER — Telehealth: Payer: Self-pay

## 2023-01-08 ENCOUNTER — Ambulatory Visit
Admission: RE | Admit: 2023-01-08 | Discharge: 2023-01-08 | Disposition: A | Discharge status: Home / Self Care | Payer: BC Managed Care – PPO | Source: Ambulatory Visit | Attending: Radiation Oncology | Admitting: Radiation Oncology

## 2023-01-08 NOTE — Progress Notes (Unsigned)
Palliative Medicine [facility]  [phone] [phone]) [phone]   Name: Doris Smith Date: 01/08/2023 MRN: [id number]  DOB: Apr 05, 1951  Patient Care Team: [name], FNP as PCP - General (Family Medicine) Jodelle Gross, NP as PCP - Cardiology (Nurse Practitioner) Ovidio Kin, MD as Consulting Physician (General Surgery) Dorothy Puffer, MD as Consulting Physician (Radiation Oncology) Lysle Pearl, MD as Consulting Physician (Pulmonary Disease) Beverely Low, MD as Consulting Physician (Orthopedic Surgery) Jene Every, MD as Consulting Physician (Orthopedic Surgery) Oneta Rack, NP as Nurse Practitioner (Adult Health Nurse Practitioner) Pershing Proud, RN as Oncology Nurse Navigator Donnelly Angelica, RN as Oncology Nurse Navigator Glenna Fellows, MD as Consulting Physician (Plastic Surgery) Rollene Rotunda, MD as Consulting Physician (Cardiology) Rachel Moulds, MD as Medical Oncologist (Hematology and Oncology)    INTERVAL HISTORY: Adalia Parayno Lupi is a 72 y.o. female with oncologic medical history including estrogen receptor positive breast cancer(07/2018) s/p right mastectomy. PET scan 10/06/22 showing widespread osseous metastasis. Other pertinent history includes atrial fibrillation, asthma, OSA, GERD, fatty liver, fibromyalgia, osteoporosis, chronic headaches, anxiety, depression, and bipolar disorder. Underwent ORIF of right hip 09/20/2022. Palliative ask to see for symptom and pain management and goals of care.   SOCIAL HISTORY:    [name] reports that she has never smoked. She has never used smokeless tobacco. She reports that she does not drink alcohol and does not use drugs.  ADVANCE DIRECTIVES:  Advanced directives on file, Doris Smith, spouse, was identified as Management consultant should patient be unable to make decision for herself   CODE STATUS: Full code  PAST MEDICAL HISTORY: Past Medical History:   Diagnosis Date   A-fib (HCC) 11/2012   PAF   Anxiety    takes Ativan   Asthma 04/07/2011   dx   Bipolar disorder (HCC)    Cancer (HCC)    Right breast   Depression    Early cataracts, bilateral    Fatty liver    Fibromyalgia    GERD (gastroesophageal reflux disease)    Irritable bowel syndrome    Mental disorder    dx bipolar   PONV (postoperative nausea and vomiting)    Sleep apnea 04/2011    does not wear CPAP    ALLERGIES:  is allergic to iohexol, codeine, palonosetron, prednisone, sulfonamide derivatives, celecoxib, sulfa antibiotics, and vitamin b12.  MEDICATIONS:  Current Outpatient Medications  Medication Sig Dispense Refill   apixaban (ELIQUIS) 5 MG TABS tablet Take 1 tablet (5 mg total) by mouth 2 (two) times daily. 180 tablet 1   clotrimazole (MYCELEX) 10 MG troche Take 1 tablet (10 mg total) by mouth 5 (five) times daily. 50 Troche 0   clotrimazole-betamethasone (LOTRISONE) cream APPLY  CREAM TOPICALLY TWICE DAILY 45 g 0   esomeprazole (NEXIUM) 40 MG capsule Take 1 capsule (40 mg total) by mouth daily. 90 capsule 0   fluticasone (FLONASE) 50 MCG/ACT nasal spray Place 2 sprays into both nostrils daily. 16 g 6   furosemide (LASIX) 20 MG tablet Take 1 tablet (20 mg total) by mouth daily. 90 tablet 1   gabapentin (NEURONTIN) 300 MG capsule Take 1 capsule (300 mg total) by mouth 3 (three) times daily. 90 capsule 5   Incontinence Supply Disposable (CERTAINTY FITTED BRIEFS XL) MISC 1 Package by Does not apply route as needed. 56 each 3   lidocaine (LIDODERM) 5 % Place 2 patches onto the skin daily. Remove & Discard patch within 12 hours or as  directed by MD 60 patch 0   loratadine (CLARITIN) 10 MG tablet Take 10 mg by mouth daily.     Lurasidone HCl (LATUDA) 60 MG TABS TAKE 1 TABLET BY MOUTH ONCE DAILY 90 tablet 0   magic mouthwash (nystatin, diphenhydrAMINE, alum & mag hydroxide) suspension mixture Swish and swallow 5 mLs 4 (four) times daily as needed for mouth pain.  240 mL 1   metoprolol tartrate (LOPRESSOR) 25 MG tablet Take 1 tablet (25 mg total) by mouth 2 (two) times daily. 180 tablet 1   midodrine (PROAMATINE) 2.5 MG tablet Take 1 tablet (2.5 mg total) by mouth 3 (three) times daily with meals. 270 tablet 3   ondansetron (ZOFRAN) 8 MG tablet Take 1 tablet (8 mg total) by mouth every 8 (eight) hours as needed for nausea. 30 tablet 3   oxyCODONE (OXYCONTIN) 10 mg 12 hr tablet Take 1 tablet (10 mg total) by mouth every 12 (twelve) hours. 60 tablet 0   oxyCODONE-acetaminophen (PERCOCET) 7.5-325 MG tablet Take 1 tablet by mouth every 4 (four) hours as needed for severe pain. 60 tablet 0   palbociclib (IBRANCE) 100 MG tablet Take 1 tablet (100 mg total) by mouth daily. Take for 21 days on, 7 days off, repeat every 28 days. 21 tablet 3   pramipexole (MIRAPEX) 1 MG tablet Take 1 tablet (1 mg total) by mouth 3 (three) times daily. 90 tablet 0   prochlorperazine (COMPAZINE) 10 MG tablet Take 1 tablet (10 mg total) by mouth every 8 (eight) hours as needed for nausea or vomiting. 30 tablet 2   tizanidine (ZANAFLEX) 2 MG capsule Take 2 mg by mouth 3 (three) times daily as needed.     triamcinolone (KENALOG) 0.1 % Apply 1 application. topically 2 (two) times daily.     Vilazodone HCl (VIIBRYD) 40 MG TABS Take 1 tablet (40 mg total) by mouth daily. 90 tablet 1   No current facility-administered medications for this visit.   Facility-Administered Medications Ordered in Other Visits  Medication Dose Route Frequency Provider Last Rate Last Admin   heparin lock flush 100 unit/mL  500 Units Intravenous Once Magrinat, Valentino Hue, MD       sodium chloride flush (NS) 0.9 % injection 10 mL  10 mL Intravenous PRN Magrinat, Valentino Hue, MD        VITAL SIGNS: There were no vitals taken for this visit. There were no vitals filed for this visit.    Estimated body mass index is 37.25 kg/m as calculated from the following:   Height as of 12/29/22: 5\' 4"  (1.626 m).   Weight as of  12/29/22: 217 lb (98.4 kg).   PERFORMANCE STATUS (ECOG) : 2 - Symptomatic, <50% confined to bed   Physical Exam: General: NAD, sitting in wheelchair  Cardiovascular: regular rate and rhythm Pulmonary: normal breathing pattern  Abdomen: soft, nontender, + bowel sounds Extremities: trace lower extremity edema, no joint deformities Neurological: AAO x4  IMPRESSION:  Mrs. Mennella and her husband presented to clinic for symptom management follow-up. No acute distress. We discussed her appetite. Current weight is 212 lbs down from 217lbs on 6/21. She shares that since her husband has went back to work she has not eaten as much and when she does sometimes does not have much of an appetite.   We discussed what a typical meal looks like for her. She may have a bowl of instant oatmeal or cereal for breakfast however recently she has just not had energy to prepare  which causes her to skip eating. Mr. Ruhland has been trying to fix her a sandwich or something that is readily available and that she will eat. Encouraged her to also drink Ensure or boost for additional protein support. Snack in between and eat when she has desires to eat.   Mrs. Canizales has written medications that she received from Oncology visit today outlining her pain medications, antiemetics, and Ibrance instructions. This also includes potential side effects such as headaches, sleepiness. She and husband with additional questions regarding potential confusions with documented side effects which was clarified. We also discussed appropriate contacts and numbers in regards to different medications and concerns.   Neoplasm related pain Mrs. Joyner reports her pain is overall improved. Some days continue to be be a challenge. She does not want to be sleepy or drowsy and feels this is the case when taking breakthrough medication at times. We discussed taking when pain is severe allowing her to rest while also gaining some relief. She endorses taking at  least once daily.    We discussed her regimen at length.  OxyContin every 12 hours.  Percocet every 6-8 hours as needed for breakthrough pain.  Patient and husband confirms she is taking extended release as prescribed.  She is not requiring breakthrough medication around-the-clock.  Husband states she may take 1-2 times daily.  Tolerating gabapentin 300 mg twice daily and 600mg  at bedtime. Lidocaine patches for additional relief.  Will continue to closely monitor.    2.   Constipation/Nausea Constipation is controlled. Continues to have intermittent nausea and vomiting. Husband reports more of a "dry heaving" but on occasions she has thrown up. Antiemetics on hand. We discussed how to effectively use.  3.   Goals of Care 5/23: Mrs. Stare is emotional. Her husband becomes emotional as patient shares she knows her health is not the best and her cancer is not curable. She is taking life one day at time. Speaks to when her time comes she will be ready but hopeful it is not now. She knows that no one has a definitive time frame. Expresses her appreciation in all of the care and support. Expresses her goal is to continue to treat the treatable allowing her the ability to continue to thrive while not suffering.   4/30 ;Since the night of 10/30/22, Mrs. Wojton has continued to meet goal of lying in her own bed at night beside her husband. She expresses appreciation for the care of everyone at the cancer center and is hopeful for continued improvement.  We discussed Her current illness and what it means in the larger context of her on-going co-morbidities. Natural disease trajectory and expectations were discussed. We discussed the importance of continued conversation with family and their medical providers regarding overall plan of care and treatment options, ensuring decisions are within the context of the patients values and GOCs.  PLAN: OxyContin 15 mg PO every 12 hours.  Gabapentin 300 mg every morning, 300 mg  mid-day, and 600 mg at bedtime.  Lidocaine patches to back and hip Percocet 7.5/325 every 4-6 hours as needed for breakthrough pain.  Not requiring around the clock Miralax for constipation. Palliative will plan to see patient back in 2-3 weeks in collaboration to other oncology appointments   Patient expressed understanding and was in agreement with this plan. She also understands that she an call the clinic at any time with any questions, concerns, or complaints.   Any controlled substances utilized were prescribed in the context  of palliative care. PDMP has been reviewed.    Visit consisted of counseling and education dealing with the complex and emotionally intense issues of symptom management and palliative care in the setting of serious and potentially life-threatening illness.Greater than 50%  of this time was spent counseling and coordinating care related to the above assessment and plan.  Willette Alma, AGPCNP-BC  Palliative Medicine Team/[facility]  *Please note that this is a verbal dictation therefore any spelling or grammatical errors are due to the "Dragon [employer] One" system interpretation.

## 2023-01-08 NOTE — Progress Notes (Signed)
  Radiation Oncology         [phone] ________________________________  Name: Doris Smith MRN: [id number]  Date of Service: 01/08/2023  DOB: 1950/12/18  Post Treatment Telephone Note  Diagnosis:  Recurrent metastatic Stage IB, [id number], grade 2 ,ER/PR positive invasive lobular carcinoma of the right breast.  (as documented in provider EOT note)  The patient was not available for call today. Voicemail left.   The patient was encouraged to avoid sun exposure in the area of prior treatment for up to one year following radiation with either sunscreen or by the style of clothing worn in the sun.  The patient has scheduled follow up with her medical oncologist Dr. Al Pimple  for ongoing surveillance, and was encouraged to call if she develops concerns or questions regarding radiation.    [name], LPN

## 2023-01-08 NOTE — Telephone Encounter (Signed)
Pt called this morning and LVM regarding constipation and mirilax. This RN attempted to call pt back, no answer, LVM and detailed explanation of how to take mirilax and a call back number.

## 2023-01-08 NOTE — Telephone Encounter (Signed)
Pt returned RN call and discussed mirilax use with RN. Pt verbalized understanding and reviewed schedule. No further needs at this time.

## 2023-01-10 ENCOUNTER — Other Ambulatory Visit: Payer: Self-pay

## 2023-01-10 ENCOUNTER — Inpatient Hospital Stay: Payer: BC Managed Care – PPO | Attending: Hematology and Oncology | Admitting: Adult Health

## 2023-01-10 ENCOUNTER — Inpatient Hospital Stay (HOSPITAL_BASED_OUTPATIENT_CLINIC_OR_DEPARTMENT_OTHER): Payer: BC Managed Care – PPO | Admitting: Nurse Practitioner

## 2023-01-10 ENCOUNTER — Encounter: Payer: Self-pay | Admitting: Adult Health

## 2023-01-10 VITALS — BP 129/49 | HR 88 | Temp 97.7°F | Resp 18 | Ht 64.0 in | Wt 211.8 lb

## 2023-01-10 VITALS — BP 115/51 | HR 76 | Temp 98.8°F | Resp 16 | Wt 212.4 lb

## 2023-01-10 DIAGNOSIS — R530 Neoplastic (malignant) related fatigue: Secondary | ICD-10-CM

## 2023-01-10 DIAGNOSIS — C7951 Secondary malignant neoplasm of bone: Secondary | ICD-10-CM | POA: Insufficient documentation

## 2023-01-10 DIAGNOSIS — D696 Thrombocytopenia, unspecified: Secondary | ICD-10-CM | POA: Diagnosis not present

## 2023-01-10 DIAGNOSIS — R630 Anorexia: Secondary | ICD-10-CM

## 2023-01-10 DIAGNOSIS — Z170 Estrogen receptor positive status [ER+]: Secondary | ICD-10-CM | POA: Insufficient documentation

## 2023-01-10 DIAGNOSIS — G893 Neoplasm related pain (acute) (chronic): Secondary | ICD-10-CM

## 2023-01-10 DIAGNOSIS — C50411 Malignant neoplasm of upper-outer quadrant of right female breast: Secondary | ICD-10-CM | POA: Diagnosis not present

## 2023-01-10 DIAGNOSIS — D649 Anemia, unspecified: Secondary | ICD-10-CM | POA: Diagnosis not present

## 2023-01-10 DIAGNOSIS — Z79899 Other long term (current) drug therapy: Secondary | ICD-10-CM | POA: Diagnosis not present

## 2023-01-10 DIAGNOSIS — Z923 Personal history of irradiation: Secondary | ICD-10-CM | POA: Diagnosis not present

## 2023-01-10 DIAGNOSIS — R634 Abnormal weight loss: Secondary | ICD-10-CM

## 2023-01-10 DIAGNOSIS — Z515 Encounter for palliative care: Secondary | ICD-10-CM

## 2023-01-10 MED ORDER — LIDOCAINE 5 % EX PTCH
2.0000 | MEDICATED_PATCH | CUTANEOUS | 0 refills | Status: DC
Start: 2023-01-10 — End: 2023-06-08

## 2023-01-10 NOTE — Patient Instructions (Signed)
Potassium Content of Foods  Potassium is a mineral found in many foods and drinks. It can affect how the heart works, affect blood pressure, and keep fluids and electrolytes balanced in the body. It is important not to have too much potassium (hyperkalemia) or too little potassium (hypokalemia) in the body, especially in the blood. Potassium is naturally found in many different types of whole foods, such as fruits, vegetables, meat, and dairy products. Processed foods tend to be lower in potassium. The amount of potassium you need each day depends on your age and any medical conditions you may have. General recommendations are: Females aged 19 and older: 2,600 mg per day. Males aged 19 and older: 3,400 mg per day. Talk with your health care provider or dietitian about how much potassium you need. What foods are high in potassium? Below are examples of foods that have greater than 200 mg of potassium per serving. Fruits Orange -- 1 medium (130 g) has 230 mg of potassium. Banana -- 1 medium (120 g) has 420 mg of potassium. Cantaloupe, chunks -- 1 cup (160 g) has 430 mg of potassium. Vegetables Potato, baked, without skin -- 1 medium (170 g) has 600 mg of potassium. Broccoli, chopped, cooked --  cup (77.5 g) has 230 mg of potassium. Tomato, chopped or sliced -- 1 cup (152 g) has 400 mg of potassium. Grains Cereal, bran with raisins -- 1 cup (59 g) has 360 mg of potassium. Granola with almonds --  cup (82 g) has 220 mg of potassium. Meats and other proteins Ground beef patty -- 4 ounces (113 g) has 240 mg of potassium. Kidney beans, boiled --  cup (130 g) has 350 mg of potassium. Almonds -- 1 ounce (approximately 22 nuts or 28 g) has 200 mg of potassium. Dairy Cow's milk, 1% -- 1 cup (237 mL) has 360 mg of potassium. Plain vanilla low-fat yogurt --  cup (184 g) has 220 mg of potassium. The items listed above may not be a complete list of foods high in potassium. Actual amounts of potassium  may be different depending on ripeness, shelf life, and food preparation. Contact a dietitian for more information. What foods are low in potassium? Below are examples of foods that have less than 200 mg of potassium per serving. Fruits Blueberries -- 1 cup (145 g) has 110 mg of potassium. Apple -- 1 medium (140 g) has 145 mg of potassium. Grapes -- 1 cup (160 g) has 175 mg of potassium. Vegetables Cabbage, raw -- 1 cup (70 g) has 120 mg of potassium. Cauliflower, chopped, cooked -- 1 cup (180 g) has 90 mg of potassium. Romaine lettuce, chopped -- 1 cup (56 g) has 120 mg of potassium. Grains Bagel, plain -- one 4-inch (10 cm) has 100 mg of potassium. Whole wheat bread -- 1 slice (26 g) has 70 mg of potassium. White rice, cooked -- 1 cup (163 g) has 50 mg of potassium. Meats and other proteins Tuna, light, canned in water -- 3 ounces (85 g) has 150 mg of potassium. Egg, fried -- 1 large (50 g) has 60 mg of potassium. Peanuts --1 ounce (35 nuts or 28 g) has 180 mg of potassium. Tofu --  cup (252 g) has 150 mg of potassium. Dairy Cheese (cheddar, colby, mozzarella, or provolone) -- 1 ounce (28 g) has 30 to 40 mg of potassium. The items listed above may not be a complete list of foods that are low in potassium. Actual amounts of   potassium may be different depending on ripeness, shelf life, and food preparation. Contact a dietitian for more information. Summary Potassium is a mineral found in many foods and drinks. It affects how the heart works, affects blood pressure, and keeps fluids and electrolytes balanced in the body. The amount of potassium you need each day depends on your age and any existing medical conditions you may have. Your health care provider or dietitian may recommend an amount of potassium that you should have each day. This information is not intended to replace advice given to you by your health care provider. Make sure you discuss any questions you have with your health  care provider. Document Revised: 03/29/2021 Document Reviewed: 03/10/2021 Elsevier Patient Education  2024 [employer].  

## 2023-01-10 NOTE — Assessment & Plan Note (Signed)
Doris Smith is a 72 year old woman with metastatic breast cancer here today for follow-up and evaluation.  Metastatic breast cancer to the bone: She is tolerating Faslodex and Zometa well she will continue this.  She started her Ibrance.  She will continue this. Cancer related pain: Will continue to follow-up with [facility] and palliative care. Nausea: We wrote out specifically her nausea medications and how to take them.  She and her spouse verbalized understanding so she can get her nausea under better control. Constipation.  We reviewed that she should take MiraLAX daily in addition to a stool softener.  She is planning on starting this today. Tardive dyskinesia: Present prior to her beginning any medications that we prescribed-unchanged from prior.  Doris Smith will return on July 17 for labs, follow-up with Dr. Al Pimple, and Faslodex and Zometa.

## 2023-01-10 NOTE — Progress Notes (Signed)
[facility] Cancer Follow up:    Doris Pierini, FNP [address] [place] [address]   DIAGNOSIS:  Cancer Staging  Malignant neoplasm of upper-outer quadrant of right breast in female, estrogen receptor positive (HCC) Staging form: Breast, AJCC 8th Edition - Pathologic: Stage IB (pT3, pN23mi, cM0, G2, ER+, PR+, HER2-) - Signed by Loa Socks, NP on 10/02/2018 Multigene prognostic tests performed: MammaPrint Histologic grading system: 3 grade system - Clinical stage from 12/28/2022: Stage IV (rcT2, cN0, cM1, G3, ER+, PR+, HER2-) - Signed by Rachel Moulds, MD on 12/28/2022 Stage prefix: Recurrence Histologic grading system: 3 grade system Laterality: Right Staged by: Pathologist and managing physician Stage used in treatment planning: Yes National guidelines used in treatment planning: Yes Type of national guideline used in treatment planning: NCCN   SUMMARY OF ONCOLOGIC HISTORY: Oncology History  Malignant neoplasm of upper-outer quadrant of right breast in female, estrogen receptor positive (HCC)  08/08/2018 Initial Diagnosis   status post right breast biopsy 08/02/2018 for a clinical T3 N0, stage IIA invasive lobular carcinoma, grade 2, estrogen receptor strongly positive, progesterone receptor 1% positive, with no HER-2 amplification and an MIB-1 of 1%.             (a) CT scan of the head and chest, without contrast 08/23/2018 showed nonspecific 0.4 cm left lower lobe pulmonary nodule, no definitive metastatic disease             (b) bone scan 08/23/2018 shows multiple spinal areas of abnormal uptake, but             (c) total spinal MRI 09/03/2018 finds bone scan findings to be secondary to degenerative disease, no evidence of metastatic disease.   08/2018 - 10/2022 Anti-estrogen oral therapy   Anastrozole; discontinued 10/14/2018 in preparation for chemotherapy, resumed October 2020   09/16/2018 Surgery   Right mastectomy Doris Smith)  [phone]): Invasive Lobular Carcinoma, 10.5 cm, grade 2, negative margins. 1 of 5 lymph nodes positive for carcinoma.   09/16/2018 Miscellaneous   MammaPrint "high risk" suggests a 5-year metastasis free survival of 93% with chemotherapy, with an absolute chemotherapy benefit in the greater than 12% range    09/16/2018 Miscellaneous   Caris testing on mastectomy sample (09/16/2018) showed stable MSI and proficient mismatch repair status, with a low mutational burden; BRCA 1 and 2 were not mutated, PI K3 was not mutated, ER B B2 was not mutated, and AKT 1 was not mutated. 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 Administration: 0.25 mg (11/05/2018), 0.25 mg (11/26/2018), 0.25 mg (12/17/2018), 0.25 mg (01/07/2019), 0.25 mg (01/28/2019), 0.25 mg (02/18/2019), 0.25 mg (03/11/2019), 0.25 mg (04/02/2019)  methotrexate (PF) chemo injection 84 mg, 39.8 mg/m2 = 84.5 mg, Intravenous,  Once, 5 of 5 cycles. Administration: 84 mg (11/05/2018), 84 mg (11/26/2018), 84 mg (12/17/2018), 84 mg (03/11/2019), 84 mg (04/02/2019)  pegfilgrastim-cbqv (UDENYCA) injection 6 mg, 6 mg, Subcutaneous, Once, 6 of 6 cycles. Administration: 6 mg (12/19/2018)  cyclophosphamide (CYTOXAN) 1,260 mg in sodium chloride 0.9 % 250 mL chemo infusion, 600 mg/m2 = 1,260 mg, Intravenous,  Once, 8 of 8 cycle. Administration: 1,260 mg (11/05/2018), 1,260 mg (11/26/2018), 1,260 mg (12/17/2018), 1,260 mg (01/07/2019), 1,260 mg (01/28/2019), 1,260 mg (02/18/2019), 1,260 mg (03/11/2019), 1,260 mg (04/02/2019)  fluorouracil (ADRUCIL) chemo injection 1,250  mg, 600 mg/m2 = 1,250 mg, Intravenous,  Once, 8 of 8 cycles. Administration: 1,250 mg (11/05/2018), 1,250 mg (11/26/2018), 1,250 mg (12/17/2018), 1,250 mg (01/07/2019), 1,250 mg (01/28/2019), 1,250 mg (02/18/2019), 1,250 mg (03/11/2019), 1,250 mg  (04/02/2019).    12/31/2018 - 02/19/2019 Radiation Therapy   The patient initially received a dose of 50.4 Gy in 28 fractions to the chest wall and supraclavicular region. This was delivered using a 3-D conformal, 4 field technique. The patient then received a boost to the mastectomy scar. This delivered an additional 10 Gy in 5 fractions using an en face electron field. The total dose was 60.4 Gy.   09/12/2022 Imaging   Bone scan on 09/12/2022 shows uptake in ribs, manubrium. Widespread osseous metastasis confirmed on PET scan that was completed on October 06, 2022. Right iliac crest biopsy demonstrated metastatic carcinoma consistent with patient's known breast carcinoma. ER 90% positive, PR 90% positive, Ki67 10%, HER2 negative.    10/10/2022 Treatment Plan Change   Faslodex beginning 10/10/2022; Ibrance beginning 12/20/2022; Zometa every 12 weeks 11/07/2022    10/23/2022 - 11/03/2022 Radiation Therapy   Palliative Radiation 10/23/2022-11/03/2022:  left chest wall, lower T spine, and right proximal hip/pelvis were each treated to 30 GY in 10 fractions.      CURRENT THERAPY: Faslodex, Ilda Foil, Zometa  INTERVAL HISTORY: Doris Smith 72 y.o. female returns for follow-up of her metastatic breast cancer.  She is taking Ibrance and restarted this a week ago.  She also receives Faslodex every 4 weeks and Zometa every 12 weeks.  Her biggest issue today has been nausea and mild constipation.  He has cancer related pain and sees our palliative care nurse practitioner [facility] about this.   Patient Active Problem List   Diagnosis Date Noted   Peripheral edema 12/29/2022   Restless leg syndrome 10/03/2021   Anxiety associated with depression 06/10/2020   Port-A-Cath in place 11/12/2018   Malignant neoplasm of upper-outer quadrant of right breast in female, estrogen receptor positive (HCC) 08/08/2018   Right knee pain 03/01/2015   Aortic atherosclerosis (HCC) 12/28/2014   Obesity (BMI 30-39.9) 04/09/2014    Insomnia due to stress 04/09/2014   Atrial fibrillation (HCC) 10/01/2013   GERD (gastroesophageal reflux disease) 10/01/2013   Bipolar disorder (HCC) 09/27/2007   Essential hypertension 09/27/2007   ALLERGIC RHINITIS 09/27/2007   IRRITABLE BOWEL SYNDROME 09/27/2007   Arthropathy 09/27/2007   DEGENERATIVE DISC DISEASE, LUMBOSACRAL SPINE 09/27/2007    is allergic to iohexol, codeine, palonosetron, prednisone, sulfonamide derivatives, celecoxib, sulfa antibiotics, and vitamin b12.  MEDICAL HISTORY: Past Medical History:  Diagnosis Date   A-fib (HCC) 11/2012   PAF   Anxiety    takes Ativan   Asthma 04/07/2011   dx   Bipolar disorder (HCC)    Cancer (HCC)    Right breast   Depression    Early cataracts, bilateral    Fatty liver    Fibromyalgia    GERD (gastroesophageal reflux disease)    Irritable bowel syndrome    Mental disorder    dx bipolar   PONV (postoperative nausea and vomiting)    Sleep apnea 04/2011    does not wear CPAP    SURGICAL HISTORY: Past Surgical History:  Procedure Laterality Date   ABDOMINAL HYSTERECTOMY     COLONOSCOPY     DILATION AND CURETTAGE OF UTERUS  1981   abnormal pap   KNEE ARTHROSCOPY Left 2007   x 2   LUMBAR LAMINECTOMY/DECOMPRESSION MICRODISCECTOMY  05/31/2011   Procedure: LUMBAR  LAMINECTOMY/DECOMPRESSION MICRODISCECTOMY;  Surgeon: Javier Docker;  Location: [facility];  Service: Orthopedics;  Laterality: Left;  Decompression Lumbar four to five and  Lumbar five to Sacral one on Left  (X-Ray)   MASTECTOMY W/ SENTINEL NODE BIOPSY Right 09/16/2018   Procedure: RIGHT MASTECTOMY WITH RIGHT AXILLARY SENTINEL LYMPH NODE BIOPSY;  Surgeon: Ovidio Kin, MD;  Location: [facility] OR;  Service: General;  Laterality: Right;   PARTIAL HYSTERECTOMY  1982   PORTACATH PLACEMENT Left 10/31/2018   Procedure: INSERTION PORT-A-CATH WITH ULTRASOUND;  Surgeon: Ovidio Kin, MD;  Location: [facility];  Service: General;  Laterality: Left;    TONSILLECTOMY     as child   TOTAL KNEE ARTHROPLASTY Left 12/18/2005    SOCIAL HISTORY: Social History   Socioeconomic History   Marital status: Married    Spouse name: Doris Smith   Number of children: 2   Years of education: 12   Highest education level: Not on file  Occupational History   Occupation: Disabled  Tobacco Use   Smoking status: Never   Smokeless tobacco: Never  Vaping Use   Vaping Use: Never used  Substance and Sexual Activity   Alcohol use: No   Drug use: No   Sexual activity: Yes    Birth control/protection: Post-menopausal, Surgical  Other Topics Concern   Not on file  Social History Narrative   Tea daily.  Rarely has caffeine    Social Determinants of Health   Financial Resource Strain: Low Risk  (08/18/2022)   Overall Financial Resource Strain (CARDIA)    Difficulty of Paying Living Expenses: Not hard at all  Food Insecurity: Unknown (08/18/2022)   Hunger Vital Sign    Worried About Running Out of Food in the Last Year: Patient declined    Ran Out of Food in the Last Year: Never true  Transportation Needs: No Transportation Needs (08/18/2022)   PRAPARE - Administrator, Civil Service (Medical): No    Lack of Transportation (Non-Medical): No  Physical Activity: Inactive (08/18/2022)   Exercise Vital Sign    Days of Exercise per Week: 0 days    Minutes of Exercise per Session: 0 min  Stress: No Stress Concern Present (08/18/2022)   [employer] of Occupational Health - Occupational Stress Questionnaire    Feeling of Stress : Not at all  Social Connections: Moderately Integrated (08/18/2022)   Social Connection and Isolation Panel [NHANES]    Frequency of Communication with Friends and Family: More than three times a week    Frequency of Social Gatherings with Friends and Family: More than three times a week    Attends Religious Services: More than 4 times per year    Active Member of [employer] or Organizations: No    Attends Banker  Meetings: Never    Marital Status: Married  Catering manager Violence: Not At Risk (08/18/2022)   Humiliation, Afraid, Rape, and Kick questionnaire    Fear of Current or Ex-Partner: No    Emotionally Abused: No    Physically Abused: No    Sexually Abused: No    FAMILY HISTORY: Family History  Problem Relation Age of Onset   Anesthesia problems Mother    Heart disease Mother        CHF, atrial fib   Heart failure Mother    Anesthesia problems Sister    Colon polyps Father    Heart disease Father        "Fluid around the heart"   Hypertension  Sister    Breast cancer Maternal Aunt    Colon cancer Maternal Aunt    Prostate cancer Neg Hx    Ovarian cancer Neg Hx     Review of Systems  Constitutional:  Negative for appetite change, chills, fatigue, fever and unexpected weight change.  HENT:   Negative for hearing loss, lump/mass and trouble swallowing.   Eyes:  Negative for eye problems and icterus.  Respiratory:  Negative for chest tightness, cough and shortness of breath.   Cardiovascular:  Negative for chest pain, leg swelling and palpitations.  Gastrointestinal:  Positive for constipation and nausea. Negative for abdominal distention, abdominal pain, blood in stool, diarrhea, rectal pain and vomiting.  Endocrine: Negative for hot flashes.  Genitourinary:  Negative for difficulty urinating.   Musculoskeletal:  Negative for arthralgias.  Skin:  Negative for itching and rash.  Neurological:  Negative for dizziness, extremity weakness, headaches and numbness.  Hematological:  Negative for adenopathy. Does not bruise/bleed easily.  Psychiatric/Behavioral:  Negative for depression. The patient is not nervous/anxious.       PHYSICAL EXAMINATION   Onc Performance Status - 01/10/23 1110       ECOG Perf Status   ECOG Perf Status Capable of only limited selfcare, confined to bed or chair more than 50% of waking hours      KPS SCALE   KPS % SCORE Requires considerable  assistance, and frequent medical care             Vitals:   01/10/23 1047  BP: (!) 129/49  Pulse: 88  Resp: 18  Temp: 97.7 F (36.5 C)  SpO2: 96%    Physical Exam Constitutional:      General: She is not in acute distress.    Appearance: Normal appearance. She is not toxic-appearing.  HENT:     Head: Normocephalic and atraumatic.     Mouth/Throat:     Mouth: Mucous membranes are moist.     Pharynx: Oropharynx is clear. No oropharyngeal exudate or posterior oropharyngeal erythema.  Eyes:     General: No scleral icterus. Cardiovascular:     Rate and Rhythm: Normal rate and regular rhythm.     Pulses: Normal pulses.     Heart sounds: Normal heart sounds.  Pulmonary:     Effort: Pulmonary effort is normal.     Breath sounds: Normal breath sounds.  Abdominal:     General: Abdomen is flat. Bowel sounds are normal. There is no distension.     Palpations: Abdomen is soft.     Tenderness: There is no abdominal tenderness.  Musculoskeletal:        General: No swelling.     Cervical back: Neck supple.  Lymphadenopathy:     Cervical: No cervical adenopathy.  Skin:    General: Skin is warm and dry.     Findings: No rash.  Neurological:     General: No focal deficit present.     Mental Status: She is alert.  Psychiatric:        Mood and Affect: Mood normal.        Behavior: Behavior normal.     LABORATORY DATA:  CBC    Component Value Date/Time   WBC 5.5 11/30/2022 1339   RBC 2.93 (L) 11/30/2022 1339   HGB 9.1 (L) 11/30/2022 1339   HGB 12.0 06/05/2022 1024   HCT 28.2 (L) 11/30/2022 1339   HCT 37.4 06/05/2022 1024   PLT 163 11/30/2022 1339   PLT 251 06/05/2022 1024  MCV 96.2 11/30/2022 1339   MCV 86 06/05/2022 1024   MCH 31.1 11/30/2022 1339   MCHC 32.3 11/30/2022 1339   RDW 18.7 (H) 11/30/2022 1339   RDW 15.5 (H) 06/05/2022 1024   LYMPHSABS 0.4 (L) 11/30/2022 1339   LYMPHSABS 0.9 06/05/2022 1024   MONOABS 0.6 11/30/2022 1339   EOSABS 0.1 11/30/2022  1339   EOSABS 0.1 06/05/2022 1024   BASOSABS 0.0 11/30/2022 1339   BASOSABS 0.1 06/05/2022 1024    CMP     Component Value Date/Time   NA 141 11/30/2022 1339   NA 145 (H) 06/05/2022 1024   K 3.4 (L) 11/30/2022 1339   CL 103 11/30/2022 1339   CO2 28 11/30/2022 1339   GLUCOSE 168 (H) 11/30/2022 1339   BUN 6 (L) 11/30/2022 1339   BUN 17 06/05/2022 1024   CREATININE 0.80 11/30/2022 1339   CREATININE 1.07 (H) 08/25/2022 0921   CALCIUM 8.7 (L) 11/30/2022 1339   PROT 6.3 (L) 11/30/2022 1339   PROT 6.6 06/05/2022 1024   ALBUMIN 3.5 11/30/2022 1339   ALBUMIN 4.0 06/05/2022 1024   AST 23 11/30/2022 1339   AST 22 08/25/2022 0921   ALT 13 11/30/2022 1339   ALT 26 08/25/2022 0921   ALKPHOS 232 (H) 11/30/2022 1339   BILITOT 0.7 11/30/2022 1339   BILITOT 0.5 08/25/2022 0921   GFRNONAA >60 11/30/2022 1339   GFRNONAA 56 (L) 08/25/2022 0921   GFRAA 81 02/03/2020 1049   GFRAA >60 12/19/2018 1434         ASSESSMENT and THERAPY PLAN:   Malignant neoplasm of upper-outer quadrant of right breast in female, estrogen receptor positive (HCC) Doris Smith is a 72 year old woman with metastatic breast cancer here today for follow-up and evaluation.  Metastatic breast cancer to the bone: She is tolerating Faslodex and Zometa well she will continue this.  She started her Ibrance.  She will continue this. Cancer related pain: Will continue to follow-up with [facility] and palliative care. Nausea: We wrote out specifically her nausea medications and how to take them.  She and her spouse verbalized understanding so she can get her nausea under better control. Constipation.  We reviewed that she should take MiraLAX daily in addition to a stool softener.  She is planning on starting this today. Tardive dyskinesia: Present prior to her beginning any medications that we prescribed-unchanged from prior.  Doris Smith will return on July 17 for labs, follow-up with Dr. Al Pimple, and Faslodex and Zometa.   All questions  were answered. The patient knows to call the clinic with any problems, questions or concerns. We can certainly see the patient much sooner if necessary.  Total encounter time:30 minutes*in face-to-face visit time, chart review, lab review, care coordination, order entry, and documentation of the encounter time.    Lillard Anes, NP 01/10/23 11:55 AM Medical Oncology and Hematology [facility] [address] [place], [place] [address] Tel. [phone]    Fax. [phone]  *Total Encounter Time as defined by the Centers for Medicare and Medicaid Services includes, in addition to the face-to-face time of a patient visit (documented in the note above) non-face-to-face time: obtaining and reviewing outside history, ordering and reviewing medications, tests or procedures, care coordination (communications with other health care professionals or caregivers) and documentation in the medical record.

## 2023-01-11 ENCOUNTER — Encounter: Payer: Self-pay | Admitting: Nurse Practitioner

## 2023-01-11 ENCOUNTER — Encounter: Payer: Self-pay | Admitting: Hematology and Oncology

## 2023-01-12 ENCOUNTER — Telehealth: Payer: Self-pay | Admitting: *Deleted

## 2023-01-12 NOTE — Telephone Encounter (Signed)
This RN received faxed communication from after hours [facility] stating pt called early am today (7:26 am) stating she is is having sharp pain in right breast area in prior mastectomy site. " Hurts when she walks"  Pt was advised to go to the ER- per chart review - no noted arrival per EPIC.  This RN called pt and obtained her VM - message left to call RN per follow up from above call.

## 2023-01-15 DIAGNOSIS — M80051D Age-related osteoporosis with current pathological fracture, right femur, subsequent encounter for fracture with routine healing: Secondary | ICD-10-CM | POA: Diagnosis not present

## 2023-01-15 DIAGNOSIS — M545 Low back pain, unspecified: Secondary | ICD-10-CM | POA: Diagnosis not present

## 2023-01-15 DIAGNOSIS — C50411 Malignant neoplasm of upper-outer quadrant of right female breast: Secondary | ICD-10-CM | POA: Diagnosis not present

## 2023-01-15 DIAGNOSIS — M797 Fibromyalgia: Secondary | ICD-10-CM | POA: Diagnosis not present

## 2023-01-15 DIAGNOSIS — I480 Paroxysmal atrial fibrillation: Secondary | ICD-10-CM | POA: Diagnosis not present

## 2023-01-15 DIAGNOSIS — F319 Bipolar disorder, unspecified: Secondary | ICD-10-CM | POA: Diagnosis not present

## 2023-01-15 DIAGNOSIS — K589 Irritable bowel syndrome without diarrhea: Secondary | ICD-10-CM | POA: Diagnosis not present

## 2023-01-15 DIAGNOSIS — G629 Polyneuropathy, unspecified: Secondary | ICD-10-CM | POA: Diagnosis not present

## 2023-01-15 DIAGNOSIS — I700 Atherosclerosis of aorta: Secondary | ICD-10-CM | POA: Diagnosis not present

## 2023-01-15 DIAGNOSIS — G8929 Other chronic pain: Secondary | ICD-10-CM | POA: Diagnosis not present

## 2023-01-15 DIAGNOSIS — Z7901 Long term (current) use of anticoagulants: Secondary | ICD-10-CM | POA: Diagnosis not present

## 2023-01-15 DIAGNOSIS — F419 Anxiety disorder, unspecified: Secondary | ICD-10-CM | POA: Diagnosis not present

## 2023-01-15 DIAGNOSIS — K760 Fatty (change of) liver, not elsewhere classified: Secondary | ICD-10-CM | POA: Diagnosis not present

## 2023-01-15 DIAGNOSIS — Z791 Long term (current) use of non-steroidal anti-inflammatories (NSAID): Secondary | ICD-10-CM | POA: Diagnosis not present

## 2023-01-15 DIAGNOSIS — G4730 Sleep apnea, unspecified: Secondary | ICD-10-CM | POA: Diagnosis not present

## 2023-01-15 DIAGNOSIS — J45909 Unspecified asthma, uncomplicated: Secondary | ICD-10-CM | POA: Diagnosis not present

## 2023-01-16 DIAGNOSIS — F410 Panic disorder [episodic paroxysmal anxiety] without agoraphobia: Secondary | ICD-10-CM | POA: Diagnosis not present

## 2023-01-16 DIAGNOSIS — F4310 Post-traumatic stress disorder, unspecified: Secondary | ICD-10-CM | POA: Diagnosis not present

## 2023-01-16 DIAGNOSIS — F312 Bipolar disorder, current episode manic severe with psychotic features: Secondary | ICD-10-CM | POA: Diagnosis not present

## 2023-01-17 DIAGNOSIS — Z7901 Long term (current) use of anticoagulants: Secondary | ICD-10-CM | POA: Diagnosis not present

## 2023-01-17 DIAGNOSIS — K589 Irritable bowel syndrome without diarrhea: Secondary | ICD-10-CM | POA: Diagnosis not present

## 2023-01-17 DIAGNOSIS — J45909 Unspecified asthma, uncomplicated: Secondary | ICD-10-CM | POA: Diagnosis not present

## 2023-01-17 DIAGNOSIS — I700 Atherosclerosis of aorta: Secondary | ICD-10-CM | POA: Diagnosis not present

## 2023-01-17 DIAGNOSIS — G629 Polyneuropathy, unspecified: Secondary | ICD-10-CM | POA: Diagnosis not present

## 2023-01-17 DIAGNOSIS — C50411 Malignant neoplasm of upper-outer quadrant of right female breast: Secondary | ICD-10-CM | POA: Diagnosis not present

## 2023-01-17 DIAGNOSIS — F419 Anxiety disorder, unspecified: Secondary | ICD-10-CM | POA: Diagnosis not present

## 2023-01-17 DIAGNOSIS — K760 Fatty (change of) liver, not elsewhere classified: Secondary | ICD-10-CM | POA: Diagnosis not present

## 2023-01-17 DIAGNOSIS — Z791 Long term (current) use of non-steroidal anti-inflammatories (NSAID): Secondary | ICD-10-CM | POA: Diagnosis not present

## 2023-01-17 DIAGNOSIS — I480 Paroxysmal atrial fibrillation: Secondary | ICD-10-CM | POA: Diagnosis not present

## 2023-01-17 DIAGNOSIS — M80051D Age-related osteoporosis with current pathological fracture, right femur, subsequent encounter for fracture with routine healing: Secondary | ICD-10-CM | POA: Diagnosis not present

## 2023-01-17 DIAGNOSIS — G8929 Other chronic pain: Secondary | ICD-10-CM | POA: Diagnosis not present

## 2023-01-17 DIAGNOSIS — M797 Fibromyalgia: Secondary | ICD-10-CM | POA: Diagnosis not present

## 2023-01-17 DIAGNOSIS — M545 Low back pain, unspecified: Secondary | ICD-10-CM | POA: Diagnosis not present

## 2023-01-17 DIAGNOSIS — F319 Bipolar disorder, unspecified: Secondary | ICD-10-CM | POA: Diagnosis not present

## 2023-01-17 DIAGNOSIS — G4730 Sleep apnea, unspecified: Secondary | ICD-10-CM | POA: Diagnosis not present

## 2023-01-18 ENCOUNTER — Telehealth: Payer: Self-pay | Admitting: *Deleted

## 2023-01-18 DIAGNOSIS — F319 Bipolar disorder, unspecified: Secondary | ICD-10-CM | POA: Diagnosis not present

## 2023-01-18 DIAGNOSIS — C50411 Malignant neoplasm of upper-outer quadrant of right female breast: Secondary | ICD-10-CM | POA: Diagnosis not present

## 2023-01-18 DIAGNOSIS — G629 Polyneuropathy, unspecified: Secondary | ICD-10-CM | POA: Diagnosis not present

## 2023-01-18 DIAGNOSIS — M797 Fibromyalgia: Secondary | ICD-10-CM | POA: Diagnosis not present

## 2023-01-18 DIAGNOSIS — K589 Irritable bowel syndrome without diarrhea: Secondary | ICD-10-CM | POA: Diagnosis not present

## 2023-01-18 DIAGNOSIS — G4730 Sleep apnea, unspecified: Secondary | ICD-10-CM | POA: Diagnosis not present

## 2023-01-18 DIAGNOSIS — J45909 Unspecified asthma, uncomplicated: Secondary | ICD-10-CM | POA: Diagnosis not present

## 2023-01-18 DIAGNOSIS — F419 Anxiety disorder, unspecified: Secondary | ICD-10-CM | POA: Diagnosis not present

## 2023-01-18 DIAGNOSIS — I700 Atherosclerosis of aorta: Secondary | ICD-10-CM | POA: Diagnosis not present

## 2023-01-18 DIAGNOSIS — M545 Low back pain, unspecified: Secondary | ICD-10-CM | POA: Diagnosis not present

## 2023-01-18 DIAGNOSIS — G8929 Other chronic pain: Secondary | ICD-10-CM | POA: Diagnosis not present

## 2023-01-18 DIAGNOSIS — K760 Fatty (change of) liver, not elsewhere classified: Secondary | ICD-10-CM | POA: Diagnosis not present

## 2023-01-18 DIAGNOSIS — I480 Paroxysmal atrial fibrillation: Secondary | ICD-10-CM | POA: Diagnosis not present

## 2023-01-18 DIAGNOSIS — M80051D Age-related osteoporosis with current pathological fracture, right femur, subsequent encounter for fracture with routine healing: Secondary | ICD-10-CM | POA: Diagnosis not present

## 2023-01-18 DIAGNOSIS — Z7901 Long term (current) use of anticoagulants: Secondary | ICD-10-CM | POA: Diagnosis not present

## 2023-01-18 DIAGNOSIS — Z791 Long term (current) use of non-steroidal anti-inflammatories (NSAID): Secondary | ICD-10-CM | POA: Diagnosis not present

## 2023-01-18 NOTE — Telephone Encounter (Signed)
This RN returned VM left by pt stating " I feel cold all the time, just can't get warm, always wearing a sweater- is that expected "  Obtained pt's VM - message left to return call.

## 2023-01-23 NOTE — Progress Notes (Unsigned)
Palliative Medicine [facility]  [phone] [phone]   Name: Doris Smith Date: 01/23/2023 MRN: [id number]  DOB: 1950/09/21  Patient Care Team: [name], FNP as PCP - General (Family Medicine) Jodelle Gross, NP as PCP - Cardiology (Nurse Practitioner) Ovidio Kin, MD as Consulting Physician (General Surgery) Dorothy Puffer, MD as Consulting Physician (Radiation Oncology) Lysle Pearl, MD as Consulting Physician (Pulmonary Disease) Beverely Low, MD as Consulting Physician (Orthopedic Surgery) Jene Every, MD as Consulting Physician (Orthopedic Surgery) Oneta Rack, NP as Nurse Practitioner (Adult Health Nurse Practitioner) Pershing Proud, RN as Oncology Nurse Navigator Donnelly Angelica, RN as Oncology Nurse Navigator Glenna Fellows, MD as Consulting Physician (Plastic Surgery) Rollene Rotunda, MD as Consulting Physician (Cardiology) Rachel Moulds, MD as Medical Oncologist (Hematology and Oncology)    INTERVAL HISTORY: [name] is a 72 y.o. female with oncologic medical history including estrogen receptor positive breast cancer(07/2018) s/p right mastectomy. PET scan 10/06/22 showing widespread osseous metastasis. Other pertinent history includes atrial fibrillation, asthma, OSA, GERD, fatty liver, fibromyalgia, osteoporosis, chronic headaches, anxiety, depression, and bipolar disorder. Underwent ORIF of right hip 09/20/2022. Palliative ask to see for symptom and pain management and goals of care.   SOCIAL HISTORY:    Mrs. Nouri reports that she has never smoked. She has never used smokeless tobacco. She reports that she does not drink alcohol and does not use drugs.  ADVANCE DIRECTIVES:  Advanced directives on file, Livia Snellen Buchberger, spouse, was identified as Management consultant should patient be unable to make decision for herself   CODE STATUS: Full code  PAST MEDICAL HISTORY: Past Medical  History:  Diagnosis Date   A-fib (HCC) 11/2012   PAF   Anxiety    takes Ativan   Asthma 04/07/2011   dx   Bipolar disorder (HCC)    Cancer (HCC)    Right breast   Depression    Early cataracts, bilateral    Fatty liver    Fibromyalgia    GERD (gastroesophageal reflux disease)    Irritable bowel syndrome    Mental disorder    dx bipolar   PONV (postoperative nausea and vomiting)    Sleep apnea 04/2011    does not wear CPAP    ALLERGIES:  is allergic to iohexol, codeine, palonosetron, prednisone, sulfonamide derivatives, celecoxib, sulfa antibiotics, and vitamin b12.  MEDICATIONS:  Current Outpatient Medications  Medication Sig Dispense Refill   apixaban (ELIQUIS) 5 MG TABS tablet Take 1 tablet (5 mg total) by mouth 2 (two) times daily. 180 tablet 1   clotrimazole (MYCELEX) 10 MG troche Take 1 tablet (10 mg total) by mouth 5 (five) times daily. 50 Troche 0   clotrimazole-betamethasone (LOTRISONE) cream APPLY  CREAM TOPICALLY TWICE DAILY 45 g 0   esomeprazole (NEXIUM) 40 MG capsule Take 1 capsule (40 mg total) by mouth daily. 90 capsule 0   fluticasone (FLONASE) 50 MCG/ACT nasal spray Place 2 sprays into both nostrils daily. 16 g 6   furosemide (LASIX) 20 MG tablet Take 1 tablet (20 mg total) by mouth daily. (Patient not taking: Reported on 01/10/2023) 90 tablet 1   Incontinence Supply Disposable (CERTAINTY FITTED BRIEFS XL) MISC 1 Package by Does not apply route as needed. 56 each 3   lidocaine (LIDODERM) 5 % Place 2 patches onto the skin daily. Remove & Discard patch within 12 hours or as directed by MD 60 patch 0   loratadine (CLARITIN) 10 MG tablet Take 10 mg  by mouth daily.     Lurasidone HCl (LATUDA) 60 MG TABS TAKE 1 TABLET BY MOUTH ONCE DAILY 90 tablet 0   magic mouthwash (nystatin, diphenhydrAMINE, alum & mag hydroxide) suspension mixture Swish and swallow 5 mLs 4 (four) times daily as needed for mouth pain. 240 mL 1   metoprolol tartrate (LOPRESSOR) 25 MG tablet Take 1  tablet (25 mg total) by mouth 2 (two) times daily. 180 tablet 1   midodrine (PROAMATINE) 2.5 MG tablet Take 1 tablet (2.5 mg total) by mouth 3 (three) times daily with meals. 270 tablet 3   ondansetron (ZOFRAN) 8 MG tablet Take 1 tablet (8 mg total) by mouth every 8 (eight) hours as needed for nausea. 30 tablet 3   oxyCODONE (OXYCONTIN) 10 mg 12 hr tablet Take 1 tablet (10 mg total) by mouth every 12 (twelve) hours. 60 tablet 0   oxyCODONE-acetaminophen (PERCOCET) 7.5-325 MG tablet Take 1 tablet by mouth every 4 (four) hours as needed for severe pain. 60 tablet 0   palbociclib (IBRANCE) 100 MG tablet Take 1 tablet (100 mg total) by mouth daily. Take for 21 days on, 7 days off, repeat every 28 days. 21 tablet 3   polyethylene glycol (MIRALAX / GLYCOLAX) 17 g packet Take 17 g by mouth daily.     pramipexole (MIRAPEX) 1 MG tablet Take 1 tablet (1 mg total) by mouth 3 (three) times daily. 90 tablet 0   prochlorperazine (COMPAZINE) 10 MG tablet Take 1 tablet (10 mg total) by mouth every 8 (eight) hours as needed for nausea or vomiting. 30 tablet 2   triamcinolone (KENALOG) 0.1 % Apply 1 application. topically 2 (two) times daily.     Vilazodone HCl (VIIBRYD) 40 MG TABS Take 1 tablet (40 mg total) by mouth daily. 90 tablet 1   No current facility-administered medications for this visit.   Facility-Administered Medications Ordered in Other Visits  Medication Dose Route Frequency Provider Last Rate Last Admin   heparin lock flush 100 unit/mL  500 Units Intravenous Once Magrinat, Valentino Hue, MD       sodium chloride flush (NS) 0.9 % injection 10 mL  10 mL Intravenous PRN Magrinat, Valentino Hue, MD        VITAL SIGNS: There were no vitals taken for this visit. There were no vitals filed for this visit.    Estimated body mass index is 36.46 kg/m as calculated from the following:   Height as of 01/10/23: 5\' 4"  (1.626 m).   Weight as of 01/10/23: 212 lb 6.4 oz (96.3 kg).   PERFORMANCE STATUS (ECOG) : 2 -  Symptomatic, <50% confined to bed   Physical Exam: General: NAD, sitting in wheelchair  Cardiovascular: regular rate and rhythm Pulmonary: normal breathing pattern  Abdomen: soft, nontender, + bowel sounds Extremities: trace lower extremity edema, no joint deformities Neurological: AAO x4  IMPRESSION:  Doris Smith presents to clinic today for follow-up. No acute distress. States she is feeling well today. She is appreciative of this. Denies nausea, vomiting, constipation, or diarrhea. Some scattered ecchymosis to upper arms. Denies recent falls. Feels her appetite is slowly improving. Weight is 210lbs.   Neoplasm related pain Mrs. Dibari reports her pain is overall improved. Some days are better than others.   We discussed her regimen at length.  OxyContin every 12 hours.  Percocet every 6-8 hours as needed for breakthrough pain.  Patient and husband confirms she is taking extended release as prescribed.  She is not requiring breakthrough medication around-the-clock.  Is taking 1-2 times per day. More so at bedtime. Tolerating gabapentin 300 mg twice daily and 600mg  at bedtime. Lidocaine patches for additional relief.  Will continue to closely monitor.    2.   Constipation/Nausea Controlled with medication  3.   Goals of Care  5/23: Mrs. Morro is emotional. Her husband becomes emotional as patient shares she knows her health is not the best and her cancer is not curable. She is taking life one day at time. Speaks to when her time comes she will be ready but hopeful it is not now. She knows that no one has a definitive time frame. Expresses her appreciation in all of the care and support. Expresses her goal is to continue to treat the treatable allowing her the ability to continue to thrive while not suffering.   4/30 ;Since the night of 10/30/22, Mrs. Strohman has continued to meet goal of lying in her own bed at night beside her husband. She expresses appreciation for the care of everyone at the cancer  center and is hopeful for continued improvement.  We discussed Her current illness and what it means in the larger context of her on-going co-morbidities. Natural disease trajectory and expectations were discussed. We discussed the importance of continued conversation with family and their medical providers regarding overall plan of care and treatment options, ensuring decisions are within the context of the patients values and GOCs.  PLAN: OxyContin 15 mg PO every 12 hours.  Gabapentin 300 mg every morning, 300 mg mid-day, and 600 mg at bedtime.  Lidocaine patches to back and hip Percocet 7.5/325 every 4-6 hours as needed for breakthrough pain.  Not requiring around the clock Miralax for constipation. Palliative will plan to see patient back in 2-3 weeks in collaboration to other oncology appointments   Patient expressed understanding and was in agreement with this plan. She also understands that she an call the clinic at any time with any questions, concerns, or complaints.   Any controlled substances utilized were prescribed in the context of palliative care. PDMP has been reviewed.    Visit consisted of counseling and education dealing with the complex and emotionally intense issues of symptom management and palliative care in the setting of serious and potentially life-threatening illness.Greater than 50%  of this time was spent counseling and coordinating care related to the above assessment and plan.  Willette Alma, AGPCNP-BC  Palliative Medicine Team/ Cancer Center  *Please note that this is a verbal dictation therefore any spelling or grammatical errors are due to the "[employer]" system interpretation.

## 2023-01-24 ENCOUNTER — Inpatient Hospital Stay: Payer: BC Managed Care – PPO

## 2023-01-24 ENCOUNTER — Telehealth: Payer: Self-pay

## 2023-01-24 ENCOUNTER — Encounter: Payer: Self-pay | Admitting: Nurse Practitioner

## 2023-01-24 ENCOUNTER — Other Ambulatory Visit (HOSPITAL_COMMUNITY): Payer: Self-pay

## 2023-01-24 ENCOUNTER — Other Ambulatory Visit: Payer: Self-pay

## 2023-01-24 ENCOUNTER — Inpatient Hospital Stay: Payer: BC Managed Care – PPO | Admitting: Hematology and Oncology

## 2023-01-24 ENCOUNTER — Inpatient Hospital Stay (HOSPITAL_BASED_OUTPATIENT_CLINIC_OR_DEPARTMENT_OTHER): Payer: BC Managed Care – PPO | Admitting: Nurse Practitioner

## 2023-01-24 ENCOUNTER — Other Ambulatory Visit: Payer: Self-pay | Admitting: *Deleted

## 2023-01-24 ENCOUNTER — Telehealth: Payer: Self-pay | Admitting: *Deleted

## 2023-01-24 VITALS — BP 85/54 | HR 76 | Temp 98.1°F | Resp 18 | Ht 64.0 in | Wt 210.1 lb

## 2023-01-24 VITALS — BP 102/47 | HR 73 | Resp 18

## 2023-01-24 DIAGNOSIS — Z79899 Other long term (current) drug therapy: Secondary | ICD-10-CM | POA: Diagnosis not present

## 2023-01-24 DIAGNOSIS — C50411 Malignant neoplasm of upper-outer quadrant of right female breast: Secondary | ICD-10-CM

## 2023-01-24 DIAGNOSIS — G893 Neoplasm related pain (acute) (chronic): Secondary | ICD-10-CM

## 2023-01-24 DIAGNOSIS — Z170 Estrogen receptor positive status [ER+]: Secondary | ICD-10-CM

## 2023-01-24 DIAGNOSIS — Z515 Encounter for palliative care: Secondary | ICD-10-CM | POA: Diagnosis not present

## 2023-01-24 DIAGNOSIS — C7951 Secondary malignant neoplasm of bone: Secondary | ICD-10-CM | POA: Diagnosis not present

## 2023-01-24 DIAGNOSIS — R530 Neoplastic (malignant) related fatigue: Secondary | ICD-10-CM

## 2023-01-24 DIAGNOSIS — R110 Nausea: Secondary | ICD-10-CM

## 2023-01-24 DIAGNOSIS — Z923 Personal history of irradiation: Secondary | ICD-10-CM | POA: Diagnosis not present

## 2023-01-24 DIAGNOSIS — D696 Thrombocytopenia, unspecified: Secondary | ICD-10-CM | POA: Diagnosis not present

## 2023-01-24 DIAGNOSIS — D649 Anemia, unspecified: Secondary | ICD-10-CM | POA: Diagnosis not present

## 2023-01-24 LAB — CBC WITH DIFFERENTIAL/PLATELET
Abs Immature Granulocytes: 0.01 10*3/uL (ref 0.00–0.07)
Basophils Absolute: 0 10*3/uL (ref 0.0–0.1)
Basophils Relative: 0 %
Eosinophils Absolute: 0 10*3/uL (ref 0.0–0.5)
Eosinophils Relative: 1 %
HCT: 19.6 % — ABNORMAL LOW (ref 36.0–46.0)
Hemoglobin: 6.4 g/dL — CL (ref 12.0–15.0)
Immature Granulocytes: 1 %
Lymphocytes Relative: 13 %
Lymphs Abs: 0.2 10*3/uL — ABNORMAL LOW (ref 0.7–4.0)
MCH: 34.4 pg — ABNORMAL HIGH (ref 26.0–34.0)
MCHC: 32.7 g/dL (ref 30.0–36.0)
MCV: 105.4 fL — ABNORMAL HIGH (ref 80.0–100.0)
Monocytes Absolute: 0.1 10*3/uL (ref 0.1–1.0)
Monocytes Relative: 9 %
Neutro Abs: 0.9 10*3/uL — ABNORMAL LOW (ref 1.7–7.7)
Neutrophils Relative %: 76 %
Platelets: 49 10*3/uL — ABNORMAL LOW (ref 150–400)
RBC: 1.86 MIL/uL — ABNORMAL LOW (ref 3.87–5.11)
RDW: 20.9 % — ABNORMAL HIGH (ref 11.5–15.5)
WBC: 1.2 10*3/uL — ABNORMAL LOW (ref 4.0–10.5)
nRBC: 6.1 % — ABNORMAL HIGH (ref 0.0–0.2)

## 2023-01-24 LAB — COMPREHENSIVE METABOLIC PANEL
ALT: 14 U/L (ref 0–44)
AST: 25 U/L (ref 15–41)
Albumin: 3.3 g/dL — ABNORMAL LOW (ref 3.5–5.0)
Alkaline Phosphatase: 194 U/L — ABNORMAL HIGH (ref 38–126)
Anion gap: 5 (ref 5–15)
BUN: 10 mg/dL (ref 8–23)
CO2: 28 mmol/L (ref 22–32)
Calcium: 8.9 mg/dL (ref 8.9–10.3)
Chloride: 106 mmol/L (ref 98–111)
Creatinine, Ser: 0.73 mg/dL (ref 0.44–1.00)
GFR, Estimated: >60 mL/min (ref >60)
Glucose, Bld: 127 mg/dL — ABNORMAL HIGH (ref 70–99)
Potassium: 4.2 mmol/L (ref 3.5–5.1)
Sodium: 139 mmol/L (ref 135–145)
Total Bilirubin: 0.9 mg/dL (ref 0.3–1.2)
Total Protein: 5.7 g/dL — ABNORMAL LOW (ref 6.5–8.1)

## 2023-01-24 MED ORDER — ZOLEDRONIC ACID 4 MG/100ML IV SOLN
4.0000 mg | Freq: Once | INTRAVENOUS | Status: AC
  Administered 2023-01-24: 4 mg via INTRAVENOUS
  Filled 2023-01-24: qty 100

## 2023-01-24 MED ORDER — SODIUM CHLORIDE 0.9% FLUSH: 3.0000 mL | Freq: Once | INTRAVENOUS | Status: DC | PRN

## 2023-01-24 MED ORDER — PALBOCICLIB 75 MG PO CAPS
75.0000 mg | ORAL_CAPSULE | Freq: Every day | ORAL | 3 refills | Status: DC
  Filled 2023-01-24: qty 21, 21d supply, fill #0

## 2023-01-24 MED ORDER — SODIUM CHLORIDE 0.9% FLUSH: 10.0000 mL | Freq: Once | INTRAVENOUS | Status: DC | PRN

## 2023-01-24 MED ORDER — FULVESTRANT 250 MG/5ML IM SOSY
500.0000 mg | PREFILLED_SYRINGE | Freq: Once | INTRAMUSCULAR | Status: AC
  Administered 2023-01-24: 500 mg via INTRAMUSCULAR
  Filled 2023-01-24: qty 10

## 2023-01-24 MED ORDER — PALBOCICLIB 75 MG PO TABS
75.0000 mg | ORAL_TABLET | Freq: Every day | ORAL | 3 refills | Status: DC
Start: 2023-01-24 — End: 2023-08-28

## 2023-01-24 MED ORDER — FULVESTRANT 250 MG/5ML IM SOSY: 500.0000 mg | PREFILLED_SYRINGE | Freq: Once | INTRAMUSCULAR | Status: DC

## 2023-01-24 MED ORDER — ALTEPLASE 2 MG IJ SOLR: 2.0000 mg | Freq: Once | INTRAMUSCULAR | Status: DC | PRN

## 2023-01-24 MED ORDER — HEPARIN SOD (PORK) LOCK FLUSH 100 UNIT/ML IV SOLN: 500.0000 [IU] | Freq: Once | INTRAVENOUS | Status: DC | PRN

## 2023-01-24 MED ORDER — SODIUM CHLORIDE 0.9 % IV SOLN: Freq: Once | INTRAVENOUS | Status: AC

## 2023-01-24 MED ORDER — HEPARIN SOD (PORK) LOCK FLUSH 100 UNIT/ML IV SOLN: 250.0000 [IU] | Freq: Once | INTRAVENOUS | Status: DC | PRN

## 2023-01-24 NOTE — Progress Notes (Signed)
[facility] Cancer Follow up:    Doris Pierini, FNP [address] [place] [address]   DIAGNOSIS:  Cancer Staging  Malignant neoplasm of upper-outer quadrant of right breast in female, estrogen receptor positive (HCC) Staging form: Breast, AJCC 8th Edition - Pathologic: Stage IB (pT3, pN49mi, cM0, G2, ER+, PR+, HER2-) - Signed by Loa Socks, NP on 10/02/2018 Multigene prognostic tests performed: MammaPrint Histologic grading system: 3 grade system - Clinical stage from 12/28/2022: Stage IV (rcT2, cN0, cM1, G3, ER+, PR+, HER2-) - Signed by Rachel Moulds, MD on 12/28/2022 Stage prefix: Recurrence Histologic grading system: 3 grade system Laterality: Right Staged by: Pathologist and managing physician Stage used in treatment planning: Yes National guidelines used in treatment planning: Yes Type of national guideline used in treatment planning: NCCN   SUMMARY OF ONCOLOGIC HISTORY: Oncology History  Malignant neoplasm of upper-outer quadrant of right breast in female, estrogen receptor positive (HCC)  08/08/2018 Initial Diagnosis   status post right breast biopsy 08/02/2018 for a clinical T3 N0, stage IIA invasive lobular carcinoma, grade 2, estrogen receptor strongly positive, progesterone receptor 1% positive, with no HER-2 amplification and an MIB-1 of 1%.             (a) CT scan of the head and chest, without contrast 08/23/2018 showed nonspecific 0.4 cm left lower lobe pulmonary nodule, no definitive metastatic disease             (b) bone scan 08/23/2018 shows multiple spinal areas of abnormal uptake, but             (c) total spinal MRI 09/03/2018 finds bone scan findings to be secondary to degenerative disease, no evidence of metastatic disease.   08/2018 - 10/2022 Anti-estrogen oral therapy   Anastrozole; discontinued 10/14/2018 in preparation for chemotherapy, resumed October 2020   09/16/2018 Surgery   Right mastectomy [name]  [id number]): Invasive Lobular Carcinoma, 10.5 cm, grade 2, negative margins. 1 of 5 lymph nodes positive for carcinoma.   09/16/2018 Miscellaneous   MammaPrint "high risk" suggests a 5-year metastasis free survival of 93% with chemotherapy, with an absolute chemotherapy benefit in the greater than 12% range    09/16/2018 Miscellaneous   Caris testing on mastectomy sample (09/16/2018) showed stable MSI and proficient mismatch repair status, with a low mutational burden; BRCA 1 and 2 were not mutated, PI K3 was not mutated, ER B B2 was not mutated, and AKT 1 was not mutated. 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 Administration: 0.25 mg (11/05/2018), 0.25 mg (11/26/2018), 0.25 mg (12/17/2018), 0.25 mg (01/07/2019), 0.25 mg (01/28/2019), 0.25 mg (02/18/2019), 0.25 mg (03/11/2019), 0.25 mg (04/02/2019)  methotrexate (PF) chemo injection 84 mg, 39.8 mg/m2 = 84.5 mg, Intravenous,  Once, 5 of 5 cycles. Administration: 84 mg (11/05/2018), 84 mg (11/26/2018), 84 mg (12/17/2018), 84 mg (03/11/2019), 84 mg (04/02/2019)  pegfilgrastim-cbqv (UDENYCA) injection 6 mg, 6 mg, Subcutaneous, Once, 6 of 6 cycles. Administration: 6 mg (12/19/2018)  cyclophosphamide (CYTOXAN) 1,260 mg in sodium chloride 0.9 % 250 mL chemo infusion, 600 mg/m2 = 1,260 mg, Intravenous,  Once, 8 of 8 cycle. Administration: 1,260 mg (11/05/2018), 1,260 mg (11/26/2018), 1,260 mg (12/17/2018), 1,260 mg (01/07/2019), 1,260 mg (01/28/2019), 1,260 mg (02/18/2019), 1,260 mg (03/11/2019), 1,260 mg (04/02/2019)  fluorouracil (ADRUCIL) chemo injection 1,250  mg, 600 mg/m2 = 1,250 mg, Intravenous,  Once, 8 of 8 cycles. Administration: 1,250 mg (11/05/2018), 1,250 mg (11/26/2018), 1,250 mg (12/17/2018), 1,250 mg (01/07/2019), 1,250 mg (01/28/2019), 1,250 mg (02/18/2019), 1,250 mg (03/11/2019), 1,250 mg  (04/02/2019).    12/31/2018 - 02/19/2019 Radiation Therapy   The patient initially received a dose of 50.4 Gy in 28 fractions to the chest wall and supraclavicular region. This was delivered using a 3-D conformal, 4 field technique. The patient then received a boost to the mastectomy scar. This delivered an additional 10 Gy in 5 fractions using an en face electron field. The total dose was 60.4 Gy.   09/12/2022 Imaging   Bone scan on 09/12/2022 shows uptake in ribs, manubrium. Widespread osseous metastasis confirmed on PET scan that was completed on October 06, 2022. Right iliac crest biopsy demonstrated metastatic carcinoma consistent with patient's known breast carcinoma. ER 90% positive, PR 90% positive, Ki67 10%, HER2 negative.    10/10/2022 Treatment Plan Change   Faslodex beginning 10/10/2022; Ibrance beginning 12/20/2022; Zometa every 12 weeks 11/07/2022    10/23/2022 - 11/03/2022 Radiation Therapy   Palliative Radiation 10/23/2022-11/03/2022:  left chest wall, lower T spine, and right proximal hip/pelvis were each treated to 30 GY in 10 fractions.      CURRENT THERAPY: Faslodex, Ilda Foil, Zometa  INTERVAL HISTORY:  Doris Smith 72 y.o. female returns for follow-up of her metastatic breast cancer. She is on first-line Ibrance with Faslodex.  She is here for follow-up with her husband.  Since her last visit here, she denies any major complaints.  She states the pain is finally becoming more manageable and is better.  She was laughing during the conversation today.  She notices that her blood pressure is a little bit on the lower side but she does not have any chest pain or chest pressure or shortness of breath.  She has been taking Ibrance as instructed, this is her week off.  She is also getting the bone strengthener every 12 weeks and the Faslodex every 28 days.  No new bone pains reported.  Rest of the pertinent 10 point ROS reviewed and negative   Patient Active Problem List   Diagnosis Date  Noted   Peripheral edema 12/29/2022   Restless leg syndrome 10/03/2021   Anxiety associated with depression 06/10/2020   Port-A-Cath in place 11/12/2018   Malignant neoplasm of upper-outer quadrant of right breast in female, estrogen receptor positive (HCC) 08/08/2018   Right knee pain 03/01/2015   Aortic atherosclerosis (HCC) 12/28/2014   Obesity (BMI 30-39.9) 04/09/2014   Insomnia due to stress 04/09/2014   Atrial fibrillation (HCC) 10/01/2013   GERD (gastroesophageal reflux disease) 10/01/2013   Bipolar disorder (HCC) 09/27/2007   Essential hypertension 09/27/2007   ALLERGIC RHINITIS 09/27/2007   IRRITABLE BOWEL SYNDROME 09/27/2007   Arthropathy 09/27/2007   DEGENERATIVE DISC DISEASE, LUMBOSACRAL SPINE 09/27/2007    is allergic to iohexol, codeine, palonosetron, prednisone, sulfonamide derivatives, celecoxib, sulfa antibiotics, and vitamin b12.  MEDICAL HISTORY: Past Medical History:  Diagnosis Date   A-fib (HCC) 11/2012   PAF   Anxiety    takes Ativan   Asthma 04/07/2011   dx   Bipolar disorder (HCC)    Cancer (HCC)    Right breast   Depression    Early cataracts, bilateral    Fatty liver    Fibromyalgia    GERD (gastroesophageal reflux disease)    Irritable bowel syndrome    Mental disorder    dx bipolar  PONV (postoperative nausea and vomiting)    Sleep apnea 04/2011    does not wear CPAP    SURGICAL HISTORY: Past Surgical History:  Procedure Laterality Date   ABDOMINAL HYSTERECTOMY     COLONOSCOPY     DILATION AND CURETTAGE OF UTERUS  1981   abnormal pap   KNEE ARTHROSCOPY Left 2007   x 2   LUMBAR LAMINECTOMY/DECOMPRESSION MICRODISCECTOMY  05/31/2011   Procedure: LUMBAR LAMINECTOMY/DECOMPRESSION MICRODISCECTOMY;  Surgeon: Javier Docker;  Location: [facility];  Service: Orthopedics;  Laterality: Left;  Decompression Lumbar four to five and  Lumbar five to Sacral one on Left  (X-Ray)   MASTECTOMY W/ SENTINEL NODE BIOPSY Right 09/16/2018   Procedure: RIGHT  MASTECTOMY WITH RIGHT AXILLARY SENTINEL LYMPH NODE BIOPSY;  Surgeon: Ovidio Kin, MD;  Location: [facility] OR;  Service: General;  Laterality: Right;   PARTIAL HYSTERECTOMY  1982   PORTACATH PLACEMENT Left 10/31/2018   Procedure: INSERTION PORT-A-CATH WITH ULTRASOUND;  Surgeon: Ovidio Kin, MD;  Location: [facility];  Service: General;  Laterality: Left;   TONSILLECTOMY     as child   TOTAL KNEE ARTHROPLASTY Left 12/18/2005    SOCIAL HISTORY: Social History   Socioeconomic History   Marital status: Married    Spouse name: Doris Smith   Number of children: 2   Years of education: 12   Highest education level: Not on file  Occupational History   Occupation: Disabled  Tobacco Use   Smoking status: Never   Smokeless tobacco: Never  Vaping Use   Vaping status: Never Used  Substance and Sexual Activity   Alcohol use: No   Drug use: No   Sexual activity: Yes    Birth control/protection: Post-menopausal, Surgical  Other Topics Concern   Not on file  Social History Narrative   Tea daily.  Rarely has caffeine    Social Determinants of Health   Financial Resource Strain: Low Risk  (08/18/2022)   Overall Financial Resource Strain (CARDIA)    Difficulty of Paying Living Expenses: Not hard at all  Food Insecurity: Low Risk  (09/25/2022)   [facility]   Food vital sign    Within the past 12 months, you worried that your food would run out before you got money to buy more: Never true    Within the past 12 months, the food you bought just didn't last and you didn't have money to get more. : Never true  Transportation Needs: No Transportation Needs (10/04/2022)   [facility]   PRAPARE - Transportation    Lack of Transportation (Medical): No    Lack of Transportation (Non-Medical): No  Physical Activity: Inactive (08/18/2022)   Exercise Vital Sign    Days of Exercise per Week: 0 days    Minutes of Exercise per Session: 0  min  Stress: No Stress Concern Present (08/18/2022)   [employer] of Occupational Health - Occupational Stress Questionnaire    Feeling of Stress : Not at all  Social Connections: Moderately Integrated (08/18/2022)   Social Connection and Isolation Panel [NHANES]    Frequency of Communication with Friends and Family: More than three times a week    Frequency of Social Gatherings with Friends and Family: More than three times a week    Attends Religious Services: More than 4 times per year    Active Member of [employer] or Organizations: No    Attends Banker Meetings: Never    Marital  Status: Married  Catering manager Violence: Not At Risk (08/18/2022)   Humiliation, Afraid, Rape, and Kick questionnaire    Fear of Current or Ex-Partner: No    Emotionally Abused: No    Physically Abused: No    Sexually Abused: No    FAMILY HISTORY: Family History  Problem Relation Age of Onset   Anesthesia problems Mother    Heart disease Mother        CHF, atrial fib   Heart failure Mother    Anesthesia problems Sister    Colon polyps Father    Heart disease Father        "Fluid around the heart"   Hypertension Sister    Breast cancer Maternal Aunt    Colon cancer Maternal Aunt    Prostate cancer Neg Hx    Ovarian cancer Neg Hx     Review of Systems  Constitutional:  Negative for appetite change, chills, fatigue, fever and unexpected weight change.  HENT:   Negative for hearing loss, lump/mass and trouble swallowing.   Eyes:  Negative for eye problems and icterus.  Respiratory:  Negative for chest tightness, cough and shortness of breath.   Cardiovascular:  Negative for chest pain, leg swelling and palpitations.  Gastrointestinal:  Positive for constipation and nausea. Negative for abdominal distention, abdominal pain, blood in stool, diarrhea, rectal pain and vomiting.  Endocrine: Negative for hot flashes.  Genitourinary:  Negative for difficulty urinating.   Musculoskeletal:   Negative for arthralgias.  Skin:  Negative for itching and rash.  Neurological:  Negative for dizziness, extremity weakness, headaches and numbness.  Hematological:  Negative for adenopathy. Does not bruise/bleed easily.  Psychiatric/Behavioral:  Negative for depression. The patient is not nervous/anxious.       PHYSICAL EXAMINATION     Vitals:   01/24/23 1205  BP: (!) 85/54  Pulse: 76  Resp: 18  Temp: 98.1 F (36.7 C)  SpO2: 100%    Physical Exam Constitutional:      General: She is not in acute distress.    Appearance: Normal appearance. She is not toxic-appearing.  HENT:     Head: Normocephalic and atraumatic.     Mouth/Throat:     Mouth: Mucous membranes are moist.     Pharynx: Oropharynx is clear. No oropharyngeal exudate or posterior oropharyngeal erythema.  Eyes:     General: No scleral icterus. Cardiovascular:     Rate and Rhythm: Normal rate and regular rhythm.     Pulses: Normal pulses.     Heart sounds: Normal heart sounds.  Pulmonary:     Effort: Pulmonary effort is normal.     Breath sounds: Normal breath sounds.  Abdominal:     General: Abdomen is flat. Bowel sounds are normal. There is no distension.     Palpations: Abdomen is soft.     Tenderness: There is no abdominal tenderness.  Musculoskeletal:        General: No swelling.     Cervical back: Neck supple.  Lymphadenopathy:     Cervical: No cervical adenopathy.  Skin:    General: Skin is warm and dry.     Findings: No rash.  Neurological:     General: No focal deficit present.     Mental Status: She is alert.  Psychiatric:        Mood and Affect: Mood normal.        Behavior: Behavior normal.     LABORATORY DATA:  CBC    Component Value Date/Time  WBC 1.2 (L) 01/24/2023 1133   RBC 1.86 (L) 01/24/2023 1133   HGB 6.4 (LL) 01/24/2023 1133   HGB 12.0 06/05/2022 1024   HCT 19.6 (L) 01/24/2023 1133   HCT 37.4 06/05/2022 1024   PLT 49 (L) 01/24/2023 1133   PLT 251 06/05/2022 1024    MCV 105.4 (H) 01/24/2023 1133   MCV 86 06/05/2022 1024   MCH 34.4 (H) 01/24/2023 1133   MCHC 32.7 01/24/2023 1133   RDW 20.9 (H) 01/24/2023 1133   RDW 15.5 (H) 06/05/2022 1024   LYMPHSABS 0.2 (L) 01/24/2023 1133   LYMPHSABS 0.9 06/05/2022 1024   MONOABS 0.1 01/24/2023 1133   EOSABS 0.0 01/24/2023 1133   EOSABS 0.1 06/05/2022 1024   BASOSABS 0.0 01/24/2023 1133   BASOSABS 0.1 06/05/2022 1024    CMP     Component Value Date/Time   NA 139 01/24/2023 1133   NA 145 (H) 06/05/2022 1024   K 4.2 01/24/2023 1133   CL 106 01/24/2023 1133   CO2 28 01/24/2023 1133   GLUCOSE 127 (H) 01/24/2023 1133   BUN 10 01/24/2023 1133   BUN 17 06/05/2022 1024   CREATININE 0.73 01/24/2023 1133   CREATININE 1.07 (H) 08/25/2022 0921   CALCIUM 8.9 01/24/2023 1133   PROT 5.7 (L) 01/24/2023 1133   PROT 6.6 06/05/2022 1024   ALBUMIN 3.3 (L) 01/24/2023 1133   ALBUMIN 4.0 06/05/2022 1024   AST 25 01/24/2023 1133   AST 22 08/25/2022 0921   ALT 14 01/24/2023 1133   ALT 26 08/25/2022 0921   ALKPHOS 194 (H) 01/24/2023 1133   BILITOT 0.9 01/24/2023 1133   BILITOT 0.5 08/25/2022 0921   GFRNONAA >60 01/24/2023 1133   GFRNONAA 56 (L) 08/25/2022 0921   GFRAA 81 02/03/2020 1049   GFRAA >60 12/19/2018 1434         ASSESSMENT and THERAPY PLAN:   Malignant neoplasm of upper-outer quadrant of right breast in female, estrogen receptor positive (HCC) [name] is a 72 year old woman with metastatic breast cancer here today for follow-up and evaluation.  Metastatic breast cancer to the bone: She is on frontline Ibrance with Faslodex.  She is currently on 100 mg of Ibrance from day 1 through day 21, this is her week off.  She is tolerating this combination remarkably well.  She initially had some rash from [place] but this has since resolved.  Today her blood work showed severe anemia and thrombocytopenia hence have asked her to hold the [facility] for an additional week come back for repeat lab draw next week and  then restarted 75 mg daily dosing, new prescription sent and pharmacy informed.  Her pain seems to be very well-controlled today.  She is following up with palliative care for pain management.  There appears to be no new pain or worsening pain.  No other evidence of clinical progression of the cancer.  We will continue to monitor with scans, anticipate scans in September. Severe anemia, hemoglobin of 6.3, not very clinically symptomatic.  Will arrange for 2 units of packed red blood cells at the soonest.  A sample to blood bank needs to be sent today. Thrombocytopenia, platelet count of 49,000, some areas of ecchymosis noted on the skin.  No evidence of visceral bleeding.  Will hold Ibrance and repeat labs in 1 week. Tardive dyskinesia: This is a chronic complaint, she had this even before we started any of her medications. Return to clinic for wrap-up instructions.    All questions were answered. The patient  knows to call the clinic with any problems, questions or concerns. We can certainly see the patient much sooner if necessary.  Total encounter time:30 minutes*in face-to-face visit time, chart review, lab review, care coordination, order entry, and documentation of the encounter time.    *Total Encounter Time as defined by the Centers for Medicare and Medicaid Services includes, in addition to the face-to-face time of a patient visit (documented in the note above) non-face-to-face time: obtaining and reviewing outside history, ordering and reviewing medications, tests or procedures, care coordination (communications with other health care professionals or caregivers) and documentation in the medical record.

## 2023-01-24 NOTE — Progress Notes (Signed)
Attempted prior authorization requests for Lidoderm 5% patches and Ondansetron 8 mg tablets per Prescriber request. Lidoderm patches are covered under Patient's plan with a copayment of $10 and Ondansetron 8 mg tablets are covered under patient's plan. Pharmacy had to order Patient's Lidoderm Patches and were only able to fill 9 Ondansetron tablets. Patient notified by voicemail.

## 2023-01-24 NOTE — Progress Notes (Signed)
Palliative Medicine [facility]  [phone] [phone]   Name: Doris Smith Date: 01/24/2023 MRN: [id number]  DOB: 1950-10-17  Patient Care Team: [name], FNP as PCP - General (Family Medicine) Jodelle Gross, NP as PCP - Cardiology (Nurse Practitioner) Ovidio Kin, MD as Consulting Physician (General Surgery) Dorothy Puffer, MD as Consulting Physician (Radiation Oncology) Lysle Pearl, MD as Consulting Physician (Pulmonary Disease) Beverely Low, MD as Consulting Physician (Orthopedic Surgery) Jene Every, MD as Consulting Physician (Orthopedic Surgery) Oneta Rack, NP as Nurse Practitioner (Adult Health Nurse Practitioner) Pershing Proud, RN as Oncology Nurse Navigator Donnelly Angelica, RN as Oncology Nurse Navigator Glenna Fellows, MD as Consulting Physician (Plastic Surgery) Rollene Rotunda, MD as Consulting Physician (Cardiology) Rachel Moulds, MD as Medical Oncologist (Hematology and Oncology)    INTERVAL HISTORY:  Doris Smith is a 72 y.o. female with oncologic medical history including estrogen receptor positive breast cancer(07/2018) s/p right mastectomy. PET scan 10/06/22 showing widespread osseous metastasis. Other pertinent history includes atrial fibrillation, asthma, OSA, GERD, fatty liver, fibromyalgia, osteoporosis, chronic headaches, anxiety, depression, and bipolar disorder. Underwent ORIF of right hip 09/20/2022. Palliative ask to see for symptom and pain management and goals of care.   SOCIAL HISTORY:    [name] reports that she has never smoked. She has never used smokeless tobacco. She reports that she does not drink alcohol and does not use drugs.  ADVANCE DIRECTIVES:  Advanced directives on file, Doris Smith, spouse, was identified as Management consultant should patient be unable to make decision for herself   CODE STATUS: Full code  PAST MEDICAL HISTORY: Past Medical  History:  Diagnosis Date   A-fib (HCC) 11/2012   PAF   Anxiety    takes Ativan   Asthma 04/07/2011   dx   Bipolar disorder (HCC)    Cancer (HCC)    Right breast   Depression    Early cataracts, bilateral    Fatty liver    Fibromyalgia    GERD (gastroesophageal reflux disease)    Irritable bowel syndrome    Mental disorder    dx bipolar   PONV (postoperative nausea and vomiting)    Sleep apnea 04/2011    does not wear CPAP    ALLERGIES:  is allergic to iohexol, codeine, palonosetron, prednisone, sulfonamide derivatives, celecoxib, sulfa antibiotics, and vitamin b12.  MEDICATIONS:  Current Outpatient Medications  Medication Sig Dispense Refill   apixaban (ELIQUIS) 5 MG TABS tablet Take 1 tablet (5 mg total) by mouth 2 (two) times daily. 180 tablet 1   clotrimazole (MYCELEX) 10 MG troche Take 1 tablet (10 mg total) by mouth 5 (five) times daily. 50 Troche 0   clotrimazole-betamethasone (LOTRISONE) cream APPLY  CREAM TOPICALLY TWICE DAILY 45 g 0   esomeprazole (NEXIUM) 40 MG capsule Take 1 capsule (40 mg total) by mouth daily. 90 capsule 0   fluticasone (FLONASE) 50 MCG/ACT nasal spray Place 2 sprays into both nostrils daily. 16 g 6   furosemide (LASIX) 20 MG tablet Take 1 tablet (20 mg total) by mouth daily. (Patient not taking: Reported on 01/10/2023) 90 tablet 1   Incontinence Supply Disposable (CERTAINTY FITTED BRIEFS XL) MISC 1 Package by Does not apply route as needed. 56 each 3   lidocaine (LIDODERM) 5 % Place 2 patches onto the skin daily. Remove & Discard patch within 12 hours or as directed by MD 60 patch 0   loratadine (CLARITIN) 10 MG tablet Take 10  mg by mouth daily.     Lurasidone HCl (LATUDA) 60 MG TABS TAKE 1 TABLET BY MOUTH ONCE DAILY 90 tablet 0   magic mouthwash (nystatin, diphenhydrAMINE, alum & mag hydroxide) suspension mixture Swish and swallow 5 mLs 4 (four) times daily as needed for mouth pain. 240 mL 1   metoprolol tartrate (LOPRESSOR) 25 MG tablet Take 1  tablet (25 mg total) by mouth 2 (two) times daily. 180 tablet 1   midodrine (PROAMATINE) 2.5 MG tablet Take 1 tablet (2.5 mg total) by mouth 3 (three) times daily with meals. 270 tablet 3   ondansetron (ZOFRAN) 8 MG tablet Take 1 tablet (8 mg total) by mouth every 8 (eight) hours as needed for nausea. 30 tablet 3   oxyCODONE (OXYCONTIN) 10 mg 12 hr tablet Take 1 tablet (10 mg total) by mouth every 12 (twelve) hours. 60 tablet 0   oxyCODONE-acetaminophen (PERCOCET) 7.5-325 MG tablet Take 1 tablet by mouth every 4 (four) hours as needed for severe pain. 60 tablet 0   palbociclib (IBRANCE) 100 MG tablet Take 1 tablet (100 mg total) by mouth daily. Take for 21 days on, 7 days off, repeat every 28 days. 21 tablet 3   polyethylene glycol (MIRALAX / GLYCOLAX) 17 g packet Take 17 g by mouth daily.     pramipexole (MIRAPEX) 1 MG tablet Take 1 tablet (1 mg total) by mouth 3 (three) times daily. 90 tablet 0   prochlorperazine (COMPAZINE) 10 MG tablet Take 1 tablet (10 mg total) by mouth every 8 (eight) hours as needed for nausea or vomiting. 30 tablet 2   triamcinolone (KENALOG) 0.1 % Apply 1 application. topically 2 (two) times daily.     Vilazodone HCl (VIIBRYD) 40 MG TABS Take 1 tablet (40 mg total) by mouth daily. 90 tablet 1   No current facility-administered medications for this visit.   Facility-Administered Medications Ordered in Other Visits  Medication Dose Route Frequency Provider Last Rate Last Admin   heparin lock flush 100 unit/mL  500 Units Intravenous Once Magrinat, Valentino Hue, MD       sodium chloride flush (NS) 0.9 % injection 10 mL  10 mL Intravenous PRN Magrinat, Valentino Hue, MD        VITAL SIGNS: There were no vitals taken for this visit. There were no vitals filed for this visit.    Estimated body mass index is 36.46 kg/m as calculated from the following:   Height as of 01/10/23: 5\' 4"  (1.626 m).   Weight as of 01/10/23: 212 lb 6.4 oz (96.3 kg).   PERFORMANCE STATUS (ECOG) : 2 -  Symptomatic, <50% confined to bed   Physical Exam: General: NAD, sitting in wheelchair  Cardiovascular: regular rate and rhythm Pulmonary: normal breathing pattern  Abdomen: soft, nontender, + bowel sounds Extremities: trace lower extremity edema, no joint deformities Neurological: AAO x4  IMPRESSION:  Mrs. Pineiro and her husband presented to clinic for symptom management follow-up. No acute distress. We discussed her appetite. Current weight is 212 lbs down from 217lbs on 6/21. She shares that since her husband has went back to work she has not eaten as much and when she does sometimes does not have much of an appetite.   We discussed what a typical meal looks like for her. She may have a bowl of instant oatmeal or cereal for breakfast however recently she has just not had energy to prepare which causes her to skip eating. Mr. Lippens has been trying to fix her a  sandwich or something that is readily available and that she will eat. Encouraged her to also drink Ensure or boost for additional protein support. Snack in between and eat when she has desires to eat.   Mrs. Bussey has written medications that she received from Oncology visit today outlining her pain medications, antiemetics, and Ibrance instructions. This also includes potential side effects such as headaches, sleepiness. She and husband with additional questions regarding potential confusions with documented side effects which was clarified. We also discussed appropriate contacts and numbers in regards to different medications and concerns.   Neoplasm related pain Mrs. Atiyeh reports her pain is overall improved. Some days continue to be be a challenge. She does not want to be sleepy or drowsy and feels this is the case when taking breakthrough medication at times. We discussed taking when pain is severe allowing her to rest while also gaining some relief. She endorses taking at least once daily.    We discussed her regimen at length.   OxyContin every 12 hours.  Percocet every 6-8 hours as needed for breakthrough pain.  Patient and husband confirms she is taking extended release as prescribed.  She is not requiring breakthrough medication around-the-clock.  Husband states she may take 1-2 times daily.  Tolerating gabapentin 300 mg twice daily and 600mg  at bedtime. Lidocaine patches for additional relief.  Will continue to closely monitor.    2.   Constipation/Nausea Constipation is controlled. Continues to have intermittent nausea and vomiting. Husband reports more of a "dry heaving" but on occasions she has thrown up. Antiemetics on hand. We discussed how to effectively use.  3.   Goals of Care 5/23: Mrs. Gullikson is emotional. Her husband becomes emotional as patient shares she knows her health is not the best and her cancer is not curable. She is taking life one day at time. Speaks to when her time comes she will be ready but hopeful it is not now. She knows that no one has a definitive time frame. Expresses her appreciation in all of the care and support. Expresses her goal is to continue to treat the treatable allowing her the ability to continue to thrive while not suffering.   4/30 ;Since the night of 10/30/22, Mrs. Besse has continued to meet goal of lying in her own bed at night beside her husband. She expresses appreciation for the care of everyone at the cancer center and is hopeful for continued improvement.  We discussed Her current illness and what it means in the larger context of her on-going co-morbidities. Natural disease trajectory and expectations were discussed. We discussed the importance of continued conversation with family and their medical providers regarding overall plan of care and treatment options, ensuring decisions are within the context of the patients values and GOCs.  PLAN: OxyContin 15 mg PO every 12 hours.  Gabapentin 300 mg every morning, 300 mg mid-day, and 600 mg at bedtime.  Lidocaine patches to back  and hip Percocet 7.5/325 every 4-6 hours as needed for breakthrough pain.  Not requiring around the clock Miralax for constipation. Palliative will plan to see patient back in 2-3 weeks in collaboration to other oncology appointments   Patient expressed understanding and was in agreement with this plan. She also understands that she an call the clinic at any time with any questions, concerns, or complaints.   Any controlled substances utilized were prescribed in the context of palliative care. PDMP has been reviewed.    Visit consisted of counseling and  education dealing with the complex and emotionally intense issues of symptom management and palliative care in the setting of serious and potentially life-threatening illness.Greater than 50%  of this time was spent counseling and coordinating care related to the above assessment and plan.  Willette Alma, AGPCNP-BC  Palliative Medicine Team/[facility]  *Please note that this is a verbal dictation therefore any spelling or grammatical errors are due to the "Dragon Medical One" system interpretation.

## 2023-01-24 NOTE — Patient Instructions (Signed)
Patches and nausea medications were sent to the pharmacy, they only had 9 pills of zofran in stock. They will order both the zofran and her lidocaine patches so she needs to call them for a pick up time.   Zoledronic Acid Injection (Cancer) What is this medication? ZOLEDRONIC ACID (ZOE le dron ik AS id) treats high calcium levels in the blood caused by cancer. It may also be used with chemotherapy to treat weakened bones caused by cancer. It works by slowing down the release of calcium from bones. This lowers calcium levels in your blood. It also makes your bones stronger and less likely to break (fracture). It belongs to a group of medications called bisphosphonates. This medicine may be used for other purposes; ask your health care provider or pharmacist if you have questions. COMMON BRAND NAME(S): Zometa, Zometa Powder What should I tell my care team before I take this medication? They need to know if you have any of these conditions: Dehydration Dental disease Kidney disease Liver disease Low levels of calcium in the blood Lung or breathing disease, such as asthma Receiving steroids, such as dexamethasone or prednisone An unusual or allergic reaction to zoledronic acid, other medications, foods, dyes, or preservatives Pregnant or trying to get pregnant Breast-feeding How should I use this medication? This medication is injected into a vein. It is given by your care team in a hospital or clinic setting. Talk to your care team about the use of this medication in children. Special care may be needed. Overdosage: If you think you have taken too much of this medicine contact a poison control center or emergency room at once. NOTE: This medicine is only for you. Do not share this medicine with others. What if I miss a dose? Keep appointments for follow-up doses. It is important not to miss your dose. Call your care team if you are unable to keep an appointment. What may interact with this  medication? Certain antibiotics given by injection Diuretics, such as bumetanide, furosemide NSAIDs, medications for pain and inflammation, such as ibuprofen or naproxen Teriparatide Thalidomide This list may not describe all possible interactions. Give your health care provider a list of all the medicines, herbs, non-prescription drugs, or dietary supplements you use. Also tell them if you smoke, drink alcohol, or use illegal drugs. Some items may interact with your medicine. What should I watch for while using this medication? Visit your care team for regular checks on your progress. It may be some time before you see the benefit from this medication. Some people who take this medication have severe bone, joint, or muscle pain. This medication may also increase your risk for jaw problems or a broken thigh bone. Tell your care team right away if you have severe pain in your jaw, bones, joints, or muscles. Tell you care team if you have any pain that does not go away or that gets worse. Tell your dentist and dental surgeon that you are taking this medication. You should not have major dental surgery while on this medication. See your dentist to have a dental exam and fix any dental problems before starting this medication. Take good care of your teeth while on this medication. Make sure you see your dentist for regular follow-up appointments. You should make sure you get enough calcium and vitamin D while you are taking this medication. Discuss the foods you eat and the vitamins you take with your care team. Check with your care team if you have severe  diarrhea, nausea, and vomiting, or if you sweat a lot. The loss of too much body fluid may make it dangerous for you to take this medication. You may need bloodwork while taking this medication. Talk to your care team if you wish to become pregnant or think you might be pregnant. This medication can cause serious birth defects. What side effects may I  notice from receiving this medication? Side effects that you should report to your care team as soon as possible: Allergic reactions--skin rash, itching, hives, swelling of the face, lips, tongue, or throat Kidney injury--decrease in the amount of urine, swelling of the ankles, hands, or feet Low calcium level--muscle pain or cramps, confusion, tingling, or numbness in the hands or feet Osteonecrosis of the jaw--pain, swelling, or redness in the mouth, numbness of the jaw, poor healing after dental work, unusual discharge from the mouth, visible bones in the mouth Severe bone, joint, or muscle pain Side effects that usually do not require medical attention (report to your care team if they continue or are bothersome): Constipation Fatigue Fever Loss of appetite Nausea Stomach pain This list may not describe all possible side effects. Call your doctor for medical advice about side effects. You may report side effects to FDA at 1-800-FDA-1088. Where should I keep my medication? This medication is given in a hospital or clinic. It will not be stored at home. NOTE: This sheet is a summary. It may not cover all possible information. If you have questions about this medicine, talk to your doctor, pharmacist, or health care provider.  2024 Elsevier/Gold Standard (2021-08-19 00:00:00)  Fulvestrant Injection What is this medication? FULVESTRANT (ful VES trant) treats breast cancer. It works by blocking the hormone estrogen in breast tissue, which prevents breast cancer cells from spreading or growing. This medicine may be used for other purposes; ask your health care provider or pharmacist if you have questions. COMMON BRAND NAME(S): FASLODEX What should I tell my care team before I take this medication? They need to know if you have any of these conditions: Bleeding disorder Liver disease Low blood cell levels, such as low white cells, red cells, and platelets An unusual or allergic reaction  to fulvestrant, other medications, foods, dyes, or preservatives Pregnant or trying to get pregnant Breast-feeding How should I use this medication? This medication is injected into a muscle. It is given by your care team in a hospital or clinic setting. Talk to your care team about the use of this medication in children. Special care may be needed. Overdosage: If you think you have taken too much of this medicine contact a poison control center or emergency room at once. NOTE: This medicine is only for you. Do not share this medicine with others. What if I miss a dose? Keep appointments for follow-up doses. It is important not to miss your dose. Call your care team if you are unable to keep an appointment. What may interact with this medication? Certain medications that prevent or treat blood clots, such as warfarin, enoxaparin, dalteparin, apixaban, dabigatran, rivaroxaban This list may not describe all possible interactions. Give your health care provider a list of all the medicines, herbs, non-prescription drugs, or dietary supplements you use. Also tell them if you smoke, drink alcohol, or use illegal drugs. Some items may interact with your medicine. What should I watch for while using this medication? Your condition will be monitored carefully while you are receiving this medication. You may need blood work while taking this medication.  Talk to your care team if you may be pregnant. Serious birth defects can occur if you take this medication during pregnancy and for 1 year after the last dose. You will need a negative pregnancy test before starting this medication. Contraception is recommended while taking this medication and for 1 year after the last dose. Your care team can help you find the option that works for you. Do not breastfeed while taking this medication and for 1 year after the last dose. This medication may cause infertility. Talk to your care team if you are concerned about  your fertility. What side effects may I notice from receiving this medication? Side effects that you should report to your care team as soon as possible: Allergic reactions or angioedema--skin rash, itching or hives, swelling of the face, eyes, lips, tongue, arms, or legs, trouble swallowing or breathing Pain, tingling, or numbness in the hands or feet Side effects that usually do not require medical attention (report to your care team if they continue or are bothersome): Bone, joint, or muscle pain Constipation Headache Hot flashes Nausea Pain, redness, or irritation at injection site Unusual weakness or fatigue This list may not describe all possible side effects. Call your doctor for medical advice about side effects. You may report side effects to FDA at 1-800-FDA-1088. Where should I keep my medication? This medication is given in a hospital or clinic. It will not be stored at home. NOTE: This sheet is a summary. It may not cover all possible information. If you have questions about this medicine, talk to your doctor, pharmacist, or health care provider.  2024 Elsevier/Gold Standard (2021-11-08 00:00:00)

## 2023-01-24 NOTE — Assessment & Plan Note (Signed)
Doris Smith is a 72 year old woman with metastatic breast cancer here today for follow-up and evaluation.  Metastatic breast cancer to the bone: She is on frontline Ibrance with Faslodex.  She is currently on 100 mg of Ibrance from day 1 through day 21, this is her week off.  She is tolerating this combination remarkably well.  She initially had some rash from [place] but this has since resolved.  Today her blood work showed severe anemia and thrombocytopenia hence have asked her to hold the [facility] for an additional week come back for repeat lab draw next week and then restarted 75 mg daily dosing, new prescription sent and pharmacy informed.  Her pain seems to be very well-controlled today.  She is following up with palliative care for pain management.  There appears to be no new pain or worsening pain.  No other evidence of clinical progression of the cancer.  We will continue to monitor with scans, anticipate scans in September. Severe anemia, hemoglobin of 6.3, not very clinically symptomatic.  Will arrange for 2 units of packed red blood cells at the soonest.  A sample to blood bank needs to be sent today. Thrombocytopenia, platelet count of 49,000, some areas of ecchymosis noted on the skin.  No evidence of visceral bleeding.  Will hold Ibrance and repeat labs in 1 week. Tardive dyskinesia: This is a chronic complaint, she had this even before we started any of her medications. Return to clinic for wrap-up instructions.

## 2023-01-24 NOTE — Telephone Encounter (Signed)
LM that she needs to be here at 0730

## 2023-01-25 ENCOUNTER — Inpatient Hospital Stay: Payer: BC Managed Care – PPO

## 2023-01-25 ENCOUNTER — Other Ambulatory Visit: Payer: Self-pay

## 2023-01-25 DIAGNOSIS — C50411 Malignant neoplasm of upper-outer quadrant of right female breast: Secondary | ICD-10-CM | POA: Diagnosis not present

## 2023-01-25 DIAGNOSIS — Z79899 Other long term (current) drug therapy: Secondary | ICD-10-CM | POA: Diagnosis not present

## 2023-01-25 DIAGNOSIS — D696 Thrombocytopenia, unspecified: Secondary | ICD-10-CM | POA: Diagnosis not present

## 2023-01-25 DIAGNOSIS — C7951 Secondary malignant neoplasm of bone: Secondary | ICD-10-CM | POA: Diagnosis not present

## 2023-01-25 DIAGNOSIS — Z170 Estrogen receptor positive status [ER+]: Secondary | ICD-10-CM | POA: Diagnosis not present

## 2023-01-25 DIAGNOSIS — Z923 Personal history of irradiation: Secondary | ICD-10-CM | POA: Diagnosis not present

## 2023-01-25 DIAGNOSIS — D649 Anemia, unspecified: Secondary | ICD-10-CM | POA: Diagnosis not present

## 2023-01-25 LAB — PREPARE RBC (CROSSMATCH)

## 2023-01-25 MED ORDER — ACETAMINOPHEN 325 MG PO TABS
650.0000 mg | ORAL_TABLET | Freq: Once | ORAL | Status: AC
  Administered 2023-01-25: 650 mg via ORAL
  Filled 2023-01-25: qty 2

## 2023-01-25 MED ORDER — SODIUM CHLORIDE 0.9% FLUSH: 10.0000 mL | INTRAVENOUS | Status: DC | PRN

## 2023-01-25 MED ORDER — SODIUM CHLORIDE 0.9% IV SOLUTION
250.0000 mL | Freq: Once | INTRAVENOUS | Status: AC
  Administered 2023-01-25: 250 mL via INTRAVENOUS

## 2023-01-25 NOTE — Telephone Encounter (Signed)
Oral Oncology Pharmacist Encounter   Patient's insurance requires them to fill through [facility]. Ibrance prescription has been redirected for dispensing. Additionally, prescription changed from capsules to tablets due to drug interactions as discussed with MD previously upon patient starting medication.   Oral Chemotherapy Clinic will follow up with outside pharmacy to ensure Rx is delivered.    Bethel Born, PharmD Hematology/Oncology Clinical Pharmacist [facility] Oral Chemotherapy Navigation [facility] [phone] 01/25/2023 9:40 AM

## 2023-01-25 NOTE — Patient Instructions (Signed)

## 2023-01-26 ENCOUNTER — Encounter: Payer: Self-pay | Admitting: *Deleted

## 2023-01-26 ENCOUNTER — Other Ambulatory Visit: Payer: Self-pay | Admitting: Nurse Practitioner

## 2023-01-26 ENCOUNTER — Other Ambulatory Visit: Payer: Self-pay | Admitting: Hematology and Oncology

## 2023-01-26 ENCOUNTER — Telehealth: Payer: Self-pay | Admitting: *Deleted

## 2023-01-26 DIAGNOSIS — C50411 Malignant neoplasm of upper-outer quadrant of right female breast: Secondary | ICD-10-CM

## 2023-01-26 DIAGNOSIS — G893 Neoplasm related pain (acute) (chronic): Secondary | ICD-10-CM

## 2023-01-26 DIAGNOSIS — Z515 Encounter for palliative care: Secondary | ICD-10-CM

## 2023-01-26 LAB — TYPE AND SCREEN
ABO/RH(D): O POS
Antibody Screen: NEGATIVE
Unit division: 0
Unit division: 0

## 2023-01-26 LAB — BPAM RBC
Blood Product Expiration Date: 202408192359
Blood Product Expiration Date: 202408192359
ISSUE DATE / TIME: 202407180932
ISSUE DATE / TIME: 202407180932
Unit Type and Rh: 5100
Unit Type and Rh: 5100

## 2023-01-26 MED ORDER — OXYCODONE-ACETAMINOPHEN 7.5-325 MG PO TABS
1.0000 | ORAL_TABLET | ORAL | 0 refills | Status: DC | PRN
Start: 2023-01-26 — End: 2023-05-28

## 2023-01-26 MED ORDER — OXYCODONE-ACETAMINOPHEN 7.5-325 MG PO TABS
1.0000 | ORAL_TABLET | ORAL | 0 refills | Status: DC | PRN
Start: 2023-01-26 — End: 2023-01-26

## 2023-01-26 MED ORDER — OXYCODONE HCL ER 10 MG PO T12A
10.0000 mg | EXTENDED_RELEASE_TABLET | Freq: Two times a day (BID) | ORAL | 0 refills | Status: DC
Start: 2023-01-26 — End: 2023-01-29

## 2023-01-26 NOTE — Telephone Encounter (Signed)
This RN was able to speak with pt person to person- informed pt that this RN has called her 3 times and left a VM- though she states she has not seen or received any calls or VMs.  Per call- she states she needs refill on the short acting (percocet) .  She also stated concerns for lab appts due to transportation issues and " I cannot not ask [name] to keep taking off from work " " I am all on my own"  Note pt has been referred to our transportation service but again due to issue of calls from this office not being received - transportation has not been able to be arranged.  Per call pt verbalized understanding that this office is attempting to reach her.  Discussed use of My Chart but pt is currently not an active user "because I cannot remember my sign on information "  Discussed with pt possibility that her phone may be automatically blocking numbers - and she may need someone to look at her settings to evaluate better.

## 2023-01-29 ENCOUNTER — Telehealth: Payer: Self-pay | Admitting: *Deleted

## 2023-01-29 ENCOUNTER — Other Ambulatory Visit: Payer: Self-pay

## 2023-01-29 ENCOUNTER — Telehealth: Payer: Self-pay

## 2023-01-29 DIAGNOSIS — G893 Neoplasm related pain (acute) (chronic): Secondary | ICD-10-CM

## 2023-01-29 DIAGNOSIS — C50411 Malignant neoplasm of upper-outer quadrant of right female breast: Secondary | ICD-10-CM

## 2023-01-29 DIAGNOSIS — Z515 Encounter for palliative care: Secondary | ICD-10-CM

## 2023-01-29 MED ORDER — OXYCODONE HCL ER 10 MG PO T12A
10.0000 mg | EXTENDED_RELEASE_TABLET | Freq: Two times a day (BID) | ORAL | 0 refills | Status: DC
Start: 2023-01-29 — End: 2023-02-21

## 2023-01-29 NOTE — Telephone Encounter (Signed)
This RN called pt to update and confirm medication pick up, pt confirmed and had no further questions or concerns.

## 2023-01-29 NOTE — Telephone Encounter (Signed)
Medication not in stock at preferred pharmacy, order routed to secondary pharmacy that has medication confirmed in stock

## 2023-01-30 ENCOUNTER — Other Ambulatory Visit: Payer: BC Managed Care – PPO

## 2023-01-30 ENCOUNTER — Ambulatory Visit: Payer: BC Managed Care – PPO

## 2023-01-30 ENCOUNTER — Ambulatory Visit: Payer: BC Managed Care – PPO | Admitting: Hematology and Oncology

## 2023-01-31 ENCOUNTER — Inpatient Hospital Stay: Payer: BC Managed Care – PPO

## 2023-01-31 ENCOUNTER — Encounter: Payer: Self-pay | Admitting: Nurse Practitioner

## 2023-02-02 ENCOUNTER — Encounter: Payer: Self-pay | Admitting: Hematology and Oncology

## 2023-02-02 NOTE — Telephone Encounter (Signed)
See other note

## 2023-02-03 ENCOUNTER — Other Ambulatory Visit: Payer: Self-pay | Admitting: Nurse Practitioner

## 2023-02-03 DIAGNOSIS — Z515 Encounter for palliative care: Secondary | ICD-10-CM

## 2023-02-03 DIAGNOSIS — C50411 Malignant neoplasm of upper-outer quadrant of right female breast: Secondary | ICD-10-CM

## 2023-02-03 DIAGNOSIS — R110 Nausea: Secondary | ICD-10-CM

## 2023-02-03 DIAGNOSIS — G893 Neoplasm related pain (acute) (chronic): Secondary | ICD-10-CM

## 2023-02-05 ENCOUNTER — Encounter: Payer: Self-pay | Admitting: Hematology and Oncology

## 2023-02-15 ENCOUNTER — Inpatient Hospital Stay: Payer: BC Managed Care – PPO | Attending: Hematology and Oncology | Admitting: Adult Health

## 2023-02-15 ENCOUNTER — Encounter: Payer: Self-pay | Admitting: Adult Health

## 2023-02-15 ENCOUNTER — Telehealth: Payer: Self-pay | Admitting: Internal Medicine

## 2023-02-15 ENCOUNTER — Other Ambulatory Visit: Payer: Self-pay

## 2023-02-15 ENCOUNTER — Inpatient Hospital Stay: Payer: BC Managed Care – PPO

## 2023-02-15 VITALS — BP 104/57 | HR 75 | Temp 98.4°F | Resp 17 | Wt 205.6 lb

## 2023-02-15 DIAGNOSIS — R110 Nausea: Secondary | ICD-10-CM | POA: Diagnosis not present

## 2023-02-15 DIAGNOSIS — F319 Bipolar disorder, unspecified: Secondary | ICD-10-CM | POA: Insufficient documentation

## 2023-02-15 DIAGNOSIS — Z803 Family history of malignant neoplasm of breast: Secondary | ICD-10-CM | POA: Insufficient documentation

## 2023-02-15 DIAGNOSIS — F419 Anxiety disorder, unspecified: Secondary | ICD-10-CM | POA: Diagnosis not present

## 2023-02-15 DIAGNOSIS — C50411 Malignant neoplasm of upper-outer quadrant of right female breast: Secondary | ICD-10-CM | POA: Insufficient documentation

## 2023-02-15 DIAGNOSIS — Z8249 Family history of ischemic heart disease and other diseases of the circulatory system: Secondary | ICD-10-CM | POA: Diagnosis not present

## 2023-02-15 DIAGNOSIS — K589 Irritable bowel syndrome without diarrhea: Secondary | ICD-10-CM | POA: Insufficient documentation

## 2023-02-15 DIAGNOSIS — R29818 Other symptoms and signs involving the nervous system: Secondary | ICD-10-CM | POA: Diagnosis not present

## 2023-02-15 DIAGNOSIS — C7951 Secondary malignant neoplasm of bone: Secondary | ICD-10-CM | POA: Insufficient documentation

## 2023-02-15 DIAGNOSIS — I480 Paroxysmal atrial fibrillation: Secondary | ICD-10-CM | POA: Diagnosis not present

## 2023-02-15 DIAGNOSIS — Z800 Family history of malignant neoplasm of digestive organs: Secondary | ICD-10-CM | POA: Insufficient documentation

## 2023-02-15 DIAGNOSIS — Z888 Allergy status to other drugs, medicaments and biological substances status: Secondary | ICD-10-CM | POA: Diagnosis not present

## 2023-02-15 DIAGNOSIS — Z83719 Family history of colon polyps, unspecified: Secondary | ICD-10-CM | POA: Diagnosis not present

## 2023-02-15 DIAGNOSIS — Z9011 Acquired absence of right breast and nipple: Secondary | ICD-10-CM | POA: Diagnosis not present

## 2023-02-15 DIAGNOSIS — E669 Obesity, unspecified: Secondary | ICD-10-CM | POA: Diagnosis not present

## 2023-02-15 DIAGNOSIS — Z90710 Acquired absence of both cervix and uterus: Secondary | ICD-10-CM | POA: Insufficient documentation

## 2023-02-15 DIAGNOSIS — Z170 Estrogen receptor positive status [ER+]: Secondary | ICD-10-CM | POA: Diagnosis not present

## 2023-02-15 DIAGNOSIS — I10 Essential (primary) hypertension: Secondary | ICD-10-CM | POA: Diagnosis not present

## 2023-02-15 DIAGNOSIS — Z79899 Other long term (current) drug therapy: Secondary | ICD-10-CM | POA: Diagnosis not present

## 2023-02-15 DIAGNOSIS — Z885 Allergy status to narcotic agent status: Secondary | ICD-10-CM | POA: Insufficient documentation

## 2023-02-15 DIAGNOSIS — K219 Gastro-esophageal reflux disease without esophagitis: Secondary | ICD-10-CM | POA: Insufficient documentation

## 2023-02-15 DIAGNOSIS — Z882 Allergy status to sulfonamides status: Secondary | ICD-10-CM | POA: Diagnosis not present

## 2023-02-15 DIAGNOSIS — Z7901 Long term (current) use of anticoagulants: Secondary | ICD-10-CM | POA: Insufficient documentation

## 2023-02-15 DIAGNOSIS — G893 Neoplasm related pain (acute) (chronic): Secondary | ICD-10-CM | POA: Diagnosis not present

## 2023-02-15 LAB — COMPREHENSIVE METABOLIC PANEL
ALT: 15 U/L (ref 0–44)
AST: 32 U/L (ref 15–41)
Albumin: 3.3 g/dL — ABNORMAL LOW (ref 3.5–5.0)
Alkaline Phosphatase: 236 U/L — ABNORMAL HIGH (ref 38–126)
Anion gap: 5 (ref 5–15)
BUN: 8 mg/dL (ref 8–23)
CO2: 30 mmol/L (ref 22–32)
Calcium: 8.5 mg/dL — ABNORMAL LOW (ref 8.9–10.3)
Chloride: 105 mmol/L (ref 98–111)
Creatinine, Ser: 0.74 mg/dL (ref 0.44–1.00)
GFR, Estimated: >60 mL/min (ref >60)
Glucose, Bld: 110 mg/dL — ABNORMAL HIGH (ref 70–99)
Potassium: 4.2 mmol/L (ref 3.5–5.1)
Sodium: 140 mmol/L (ref 135–145)
Total Bilirubin: 0.7 mg/dL (ref 0.3–1.2)
Total Protein: 5.9 g/dL — ABNORMAL LOW (ref 6.5–8.1)

## 2023-02-15 LAB — CBC WITH DIFFERENTIAL/PLATELET
Abs Immature Granulocytes: 0.07 10*3/uL (ref 0.00–0.07)
Basophils Absolute: 0 10*3/uL (ref 0.0–0.1)
Basophils Relative: 1 %
Eosinophils Absolute: 0.1 10*3/uL (ref 0.0–0.5)
Eosinophils Relative: 1 %
HCT: 29.5 % — ABNORMAL LOW (ref 36.0–46.0)
Hemoglobin: 9.4 g/dL — ABNORMAL LOW (ref 12.0–15.0)
Immature Granulocytes: 1 %
Lymphocytes Relative: 5 %
Lymphs Abs: 0.2 10*3/uL — ABNORMAL LOW (ref 0.7–4.0)
MCH: 31.2 pg (ref 26.0–34.0)
MCHC: 31.9 g/dL (ref 30.0–36.0)
MCV: 98 fL (ref 80.0–100.0)
Monocytes Absolute: 0.7 10*3/uL (ref 0.1–1.0)
Monocytes Relative: 14 %
Neutro Abs: 4 10*3/uL (ref 1.7–7.7)
Neutrophils Relative %: 78 %
Platelets: 245 10*3/uL (ref 150–400)
RBC: 3.01 MIL/uL — ABNORMAL LOW (ref 3.87–5.11)
RDW: 19.5 % — ABNORMAL HIGH (ref 11.5–15.5)
WBC: 5.1 10*3/uL (ref 4.0–10.5)
nRBC: 0.8 % — ABNORMAL HIGH (ref 0.0–0.2)

## 2023-02-15 LAB — SAMPLE TO BLOOD BANK

## 2023-02-15 NOTE — Progress Notes (Signed)
[facility] Cancer Follow up:    Doris Pierini, FNP [address] [place] [address]   DIAGNOSIS:  Cancer Staging  Malignant neoplasm of upper-outer quadrant of right breast in female, estrogen receptor positive (HCC) Staging form: Breast, AJCC 8th Edition - Pathologic: Stage IB (pT3, pN23mi, cM0, G2, ER+, PR+, HER2-) - Signed by Loa Socks, NP on 10/02/2018 Multigene prognostic tests performed: MammaPrint Histologic grading system: 3 grade system - Clinical stage from 12/28/2022: Stage IV (rcT2, cN0, cM1, G3, ER+, PR+, HER2-) - Signed by Rachel Moulds, MD on 12/28/2022 Stage prefix: Recurrence Histologic grading system: 3 grade system Laterality: Right Staged by: Pathologist and managing physician Stage used in treatment planning: Yes National guidelines used in treatment planning: Yes Type of national guideline used in treatment planning: NCCN   SUMMARY OF ONCOLOGIC HISTORY: Oncology History  Malignant neoplasm of upper-outer quadrant of right breast in female, estrogen receptor positive (HCC)  08/08/2018 Initial Diagnosis   status post right breast biopsy 08/02/2018 for a clinical T3 N0, stage IIA invasive lobular carcinoma, grade 2, estrogen receptor strongly positive, progesterone receptor 1% positive, with no HER-2 amplification and an MIB-1 of 1%.             (a) CT scan of the head and chest, without contrast 08/23/2018 showed nonspecific 0.4 cm left lower lobe pulmonary nodule, no definitive metastatic disease             (b) bone scan 08/23/2018 shows multiple spinal areas of abnormal uptake, but             (c) total spinal MRI 09/03/2018 finds bone scan findings to be secondary to degenerative disease, no evidence of metastatic disease.   08/2018 - 10/2022 Anti-estrogen oral therapy   Anastrozole; discontinued 10/14/2018 in preparation for chemotherapy, resumed October 2020   09/16/2018 Surgery   Right mastectomy [name]  [phone]): Invasive Lobular Carcinoma, 10.5 cm, grade 2, negative margins. 1 of 5 lymph nodes positive for carcinoma.   09/16/2018 Miscellaneous   MammaPrint "high risk" suggests a 5-year metastasis free survival of 93% with chemotherapy, with an absolute chemotherapy benefit in the greater than 12% range    09/16/2018 Miscellaneous   Caris testing on mastectomy sample (09/16/2018) showed stable MSI and proficient mismatch repair status, with a low mutational burden; BRCA 1 and 2 were not mutated, PI K3 was not mutated, ER B B2 was not mutated, and AKT 1 was not mutated. 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 Administration: 0.25 mg (11/05/2018), 0.25 mg (11/26/2018), 0.25 mg (12/17/2018), 0.25 mg (01/07/2019), 0.25 mg (01/28/2019), 0.25 mg (02/18/2019), 0.25 mg (03/11/2019), 0.25 mg (04/02/2019)  methotrexate (PF) chemo injection 84 mg, 39.8 mg/m2 = 84.5 mg, Intravenous,  Once, 5 of 5 cycles. Administration: 84 mg (11/05/2018), 84 mg (11/26/2018), 84 mg (12/17/2018), 84 mg (03/11/2019), 84 mg (04/02/2019)  pegfilgrastim-cbqv (UDENYCA) injection 6 mg, 6 mg, Subcutaneous, Once, 6 of 6 cycles. Administration: 6 mg (12/19/2018)  cyclophosphamide (CYTOXAN) 1,260 mg in sodium chloride 0.9 % 250 mL chemo infusion, 600 mg/m2 = 1,260 mg, Intravenous,  Once, 8 of 8 cycle. Administration: 1,260 mg (11/05/2018), 1,260 mg (11/26/2018), 1,260 mg (12/17/2018), 1,260 mg (01/07/2019), 1,260 mg (01/28/2019), 1,260 mg (02/18/2019), 1,260 mg (03/11/2019), 1,260 mg (04/02/2019)  fluorouracil (ADRUCIL) chemo injection 1,250  mg, 600 mg/m2 = 1,250 mg, Intravenous,  Once, 8 of 8 cycles. Administration: 1,250 mg (11/05/2018), 1,250 mg (11/26/2018), 1,250 mg (12/17/2018), 1,250 mg (01/07/2019), 1,250 mg (01/28/2019), 1,250 mg (02/18/2019), 1,250 mg (03/11/2019), 1,250 mg  (04/02/2019).    12/31/2018 - 02/19/2019 Radiation Therapy   The patient initially received a dose of 50.4 Gy in 28 fractions to the chest wall and supraclavicular region. This was delivered using a 3-D conformal, 4 field technique. The patient then received a boost to the mastectomy scar. This delivered an additional 10 Gy in 5 fractions using an en face electron field. The total dose was 60.4 Gy.   09/12/2022 Imaging   Bone scan on 09/12/2022 shows uptake in ribs, manubrium. Widespread osseous metastasis confirmed on PET scan that was completed on October 06, 2022. Right iliac crest biopsy demonstrated metastatic carcinoma consistent with patient's known breast carcinoma. ER 90% positive, PR 90% positive, Ki67 10%, HER2 negative.    10/10/2022 Treatment Plan Change   Faslodex beginning 10/10/2022; Ibrance beginning 12/20/2022; Zometa every 12 weeks 11/07/2022    10/23/2022 - 11/03/2022 Radiation Therapy   Palliative Radiation 10/23/2022-11/03/2022:  left chest wall, lower T spine, and right proximal hip/pelvis were each treated to 30 GY in 10 fractions.      CURRENT THERAPY: Faslodex, Mora Appl  INTERVAL HISTORY: Doris Smith 72 y.o. female returns for follow-up and evaluation.  She was previously anemic with a hemoglobin of 6.4.  Today it is 9.4.  She is relieved to hear that she does not need a blood transfusion.  She was told to hold the Topicycline for a week.  She told me that she was told to discontinue the palbociclib.  She said that her treatment plan is changed and she is continuing on shots only.   Patient Active Problem List   Diagnosis Date Noted   Peripheral edema 12/29/2022   Restless leg syndrome 10/03/2021   Anxiety associated with depression 06/10/2020   Port-A-Cath in place 11/12/2018   Malignant neoplasm of upper-outer quadrant of right breast in female, estrogen receptor positive (HCC) 08/08/2018   Right knee pain 03/01/2015   Aortic atherosclerosis (HCC) 12/28/2014    Obesity (BMI 30-39.9) 04/09/2014   Insomnia due to stress 04/09/2014   Atrial fibrillation (HCC) 10/01/2013   GERD (gastroesophageal reflux disease) 10/01/2013   Bipolar disorder (HCC) 09/27/2007   Essential hypertension 09/27/2007   ALLERGIC RHINITIS 09/27/2007   IRRITABLE BOWEL SYNDROME 09/27/2007   Arthropathy 09/27/2007   DEGENERATIVE DISC DISEASE, LUMBOSACRAL SPINE 09/27/2007    is allergic to iohexol, codeine, palonosetron, prednisone, sulfonamide derivatives, celecoxib, sulfa antibiotics, and vitamin b12.  MEDICAL HISTORY: Past Medical History:  Diagnosis Date   A-fib (HCC) 11/2012   PAF   Anxiety    takes Ativan   Asthma 04/07/2011   dx   Bipolar disorder (HCC)    Cancer (HCC)    Right breast   Depression    Early cataracts, bilateral    Fatty liver    Fibromyalgia    GERD (gastroesophageal reflux disease)    Irritable bowel syndrome    Mental disorder    dx bipolar   PONV (postoperative nausea and vomiting)    Sleep apnea 04/2011    does not wear CPAP    SURGICAL HISTORY: Past Surgical History:  Procedure Laterality Date   ABDOMINAL HYSTERECTOMY     COLONOSCOPY     DILATION AND CURETTAGE OF UTERUS  1981   abnormal pap   KNEE ARTHROSCOPY Left 2007  x 2   LUMBAR LAMINECTOMY/DECOMPRESSION MICRODISCECTOMY  05/31/2011   Procedure: LUMBAR LAMINECTOMY/DECOMPRESSION MICRODISCECTOMY;  Surgeon: Javier Docker;  Location: [facility];  Service: Orthopedics;  Laterality: Left;  Decompression Lumbar four to five and  Lumbar five to Sacral one on Left  (X-Ray)   MASTECTOMY W/ SENTINEL NODE BIOPSY Right 09/16/2018   Procedure: RIGHT MASTECTOMY WITH RIGHT AXILLARY SENTINEL LYMPH NODE BIOPSY;  Surgeon: Ovidio Kin, MD;  Location[facility];  Service: General;  Laterality: Right;   PARTIAL HYSTERECTOMY  1982   PORTACATH PLACEMENT Left 10/31/2018   Procedure: INSERTION PORT-A-CATH WITH ULTRASOUND;  Surgeon: Ovidio Kin, MD;  Location: [facility];  Service:  General;  Laterality: Left;   TONSILLECTOMY     as child   TOTAL KNEE ARTHROPLASTY Left 12/18/2005    SOCIAL HISTORY: Social History   Socioeconomic History   Marital status: Married    Spouse name: [name]   Number of children: 2   Years of education: 12   Highest education level: Not on file  Occupational History   Occupation: Disabled  Tobacco Use   Smoking status: Never   Smokeless tobacco: Never  Vaping Use   Vaping status: Never Used  Substance and Sexual Activity   Alcohol use: No   Drug use: No   Sexual activity: Yes    Birth control/protection: Post-menopausal, Surgical  Other Topics Concern   Not on file  Social History Narrative   Tea daily.  Rarely has caffeine    Social Determinants of Health   Financial Resource Strain: Low Risk  (08/18/2022)   Overall Financial Resource Strain (CARDIA)    Difficulty of Paying Living Expenses: Not hard at all  Food Insecurity: Low Risk  (09/25/2022)   [facility]   Food vital sign    Within the past 12 months, you worried that your food would run out before you got money to buy more: Never true    Within the past 12 months, the food you bought just didn't last and you didn't have money to get more. : Never true  Transportation Needs: No Transportation Needs (10/04/2022)   [facility]   PRAPARE - Transportation    Lack of Transportation (Medical): No    Lack of Transportation (Non-Medical): No  Physical Activity: Inactive (08/18/2022)   Exercise Vital Sign    Days of Exercise per Week: 0 days    Minutes of Exercise per Session: 0 min  Stress: No Stress Concern Present (08/18/2022)   [employer] of Occupational Health - Occupational Stress Questionnaire    Feeling of Stress : Not at all  Social Connections: Moderately Integrated (08/18/2022)   Social Connection and Isolation Panel [NHANES]    Frequency of Communication with Friends and Family: More than three  times a week    Frequency of Social Gatherings with Friends and Family: More than three times a week    Attends Religious Services: More than 4 times per year    Active Member of [employer] or Organizations: No    Attends Banker Meetings: Never    Marital Status: Married  Catering manager Violence: Not At Risk (08/18/2022)   Humiliation, Afraid, Rape, and Kick questionnaire    Fear of Current or Ex-Partner: No    Emotionally Abused: No    Physically Abused: No    Sexually Abused: No    FAMILY HISTORY: Family History  Problem Relation Age of Onset   Anesthesia problems  Mother    Heart disease Mother        CHF, atrial fib   Heart failure Mother    Anesthesia problems Sister    Colon polyps Father    Heart disease Father        "Fluid around the heart"   Hypertension Sister    Breast cancer Maternal Aunt    Colon cancer Maternal Aunt    Prostate cancer Neg Hx    Ovarian cancer Neg Hx     Review of Systems  Constitutional:  Positive for fatigue. Negative for appetite change, chills, fever and unexpected weight change.  HENT:   Negative for hearing loss, lump/mass and trouble swallowing.   Eyes:  Negative for eye problems and icterus.  Respiratory:  Negative for chest tightness, cough and shortness of breath.   Cardiovascular:  Negative for chest pain, leg swelling and palpitations.  Gastrointestinal:  Negative for abdominal distention, abdominal pain, constipation, diarrhea, nausea and vomiting.  Endocrine: Negative for hot flashes.  Genitourinary:  Negative for difficulty urinating.   Musculoskeletal:  Negative for arthralgias.  Skin:  Negative for itching and rash.  Neurological:  Negative for dizziness, extremity weakness, headaches and numbness.  Hematological:  Negative for adenopathy. Does not bruise/bleed easily.  Psychiatric/Behavioral:  Negative for depression. The patient is not nervous/anxious.       PHYSICAL EXAMINATION   Onc Performance Status -  02/15/23 1145       ECOG Perf Status   ECOG Perf Status Capable of only limited selfcare, confined to bed or chair more than 50% of waking hours      KPS SCALE   KPS % SCORE Requires considerable assistance, and frequent medical care             Vitals:   02/15/23 1131  BP: (!) 104/57  Pulse: 75  Resp: 17  Temp: 98.4 F (36.9 C)  SpO2: 100%    Physical Exam Vitals reviewed: With increased involuntarY parkinsonian-like movements in the hands, feet, and mouth.  Worse than when I examined her previously..  Constitutional:      General: She is not in acute distress.    Appearance: Normal appearance. She is not toxic-appearing.  HENT:     Head: Normocephalic and atraumatic.     Mouth/Throat:     Mouth: Mucous membranes are moist.     Pharynx: Oropharynx is clear. No oropharyngeal exudate or posterior oropharyngeal erythema.  Eyes:     General: No scleral icterus. Cardiovascular:     Rate and Rhythm: Normal rate and regular rhythm.     Pulses: Normal pulses.     Heart sounds: Normal heart sounds.  Pulmonary:     Effort: Pulmonary effort is normal.     Breath sounds: Normal breath sounds.  Abdominal:     General: Abdomen is flat. Bowel sounds are normal. There is no distension.     Palpations: Abdomen is soft.     Tenderness: There is no abdominal tenderness.  Musculoskeletal:        General: No swelling.     Cervical back: Neck supple.  Lymphadenopathy:     Cervical: No cervical adenopathy.  Skin:    General: Skin is warm and dry.     Findings: No rash.  Neurological:     General: No focal deficit present.     Mental Status: She is alert.  Psychiatric:        Mood and Affect: Mood normal.  Behavior: Behavior normal.     LABORATORY DATA:  CBC    Component Value Date/Time   WBC 5.1 02/15/2023 1050   RBC 3.01 (L) 02/15/2023 1050   HGB 9.4 (L) 02/15/2023 1050   HGB 12.0 06/05/2022 1024   HCT 29.5 (L) 02/15/2023 1050   HCT 37.4 06/05/2022 1024    PLT 245 02/15/2023 1050   PLT 251 06/05/2022 1024   MCV 98.0 02/15/2023 1050   MCV 86 06/05/2022 1024   MCH 31.2 02/15/2023 1050   MCHC 31.9 02/15/2023 1050   RDW 19.5 (H) 02/15/2023 1050   RDW 15.5 (H) 06/05/2022 1024   LYMPHSABS 0.2 (L) 02/15/2023 1050   LYMPHSABS 0.9 06/05/2022 1024   MONOABS 0.7 02/15/2023 1050   EOSABS 0.1 02/15/2023 1050   EOSABS 0.1 06/05/2022 1024   BASOSABS 0.0 02/15/2023 1050   BASOSABS 0.1 06/05/2022 1024    CMP     Component Value Date/Time   NA 140 02/15/2023 1050   NA 145 (H) 06/05/2022 1024   K 4.2 02/15/2023 1050   CL 105 02/15/2023 1050   CO2 30 02/15/2023 1050   GLUCOSE 110 (H) 02/15/2023 1050   BUN 8 02/15/2023 1050   BUN 17 06/05/2022 1024   CREATININE 0.74 02/15/2023 1050   CREATININE 1.07 (H) 08/25/2022 0921   CALCIUM 8.5 (L) 02/15/2023 1050   PROT 5.9 (L) 02/15/2023 1050   PROT 6.6 06/05/2022 1024   ALBUMIN 3.3 (L) 02/15/2023 1050   ALBUMIN 4.0 06/05/2022 1024   AST 32 02/15/2023 1050   AST 22 08/25/2022 0921   ALT 15 02/15/2023 1050   ALT 26 08/25/2022 0921   ALKPHOS 236 (H) 02/15/2023 1050   BILITOT 0.7 02/15/2023 1050   BILITOT 0.5 08/25/2022 0921   GFRNONAA >60 02/15/2023 1050   GFRNONAA 56 (L) 08/25/2022 0921   GFRAA 81 02/03/2020 1049   GFRAA >60 12/19/2018 1434     ASSESSMENT and THERAPY PLAN:   Malignant neoplasm of upper-outer quadrant of right breast in female, estrogen receptor positive (HCC) Doris Smith is a 72 year old woman with metastatic breast cancer here today for follow-up and evaluation.  Metastatic breast cancer to the bone: She is tolerating Faslodex and Zometa well she will continue this.  Doris Smith was adamant that she is not taking Ibrance and that she was told to discontinue it.  I will have her return in 2 weeks when Dr. Al Pimple returns to discuss this further. Cancer related pain: Will continue to follow-up with [facility] and palliative care. Constipation.  We reviewed that she should take MiraLAX daily in  addition to a stool softener.  She is planning on starting this today. Parkinsonian-like movements: I reviewed the patient's medication list with Marylynn Pearson, CPP.  There is a category D interaction between the patient's Latuda and Mirapex.  I reached out to [facility] about this interaction and whether she thought it could be related to her parkinsonian-like movements.  I also placed a referral to our neuro oncologist, Dr. Elissa Hefty for his opinion on this.  Doris Smith will return in 2 weeks for labs and f/u.    All questions were answered. The patient knows to call the clinic with any problems, questions or concerns. We can certainly see the patient much sooner if necessary.  Total encounter time:40 minutes*in face-to-face visit time, chart review, lab review, care coordination, order entry, and documentation of the encounter time.  Lillard Anes, NP 02/15/23 4:28 PM Medical Oncology and Hematology [facility] [address] [place], [place] [address] Tel. [phone]    Fax. [phone]  *Total Encounter Time as defined by the Centers for Medicare and Medicaid Services includes, in addition to the face-to-face time of a patient visit (documented in the note above) non-face-to-face time: obtaining and reviewing outside history, ordering and reviewing medications, tests or procedures, care coordination (communications with other health care professionals or caregivers) and documentation in the medical record.

## 2023-02-15 NOTE — Assessment & Plan Note (Signed)
Doris Smith is a 72 year old woman with metastatic breast cancer here today for follow-up and evaluation.  Metastatic breast cancer to the bone: She is tolerating Faslodex and Zometa well she will continue this.  Doris Smith was adamant that she is not taking Ibrance and that she was told to discontinue it.  I will have her return in 2 weeks when Dr. Al Pimple returns to discuss this further. Cancer related pain: Will continue to follow-up with [facility] and palliative care. Constipation.  We reviewed that she should take MiraLAX daily in addition to a stool softener.  She is planning on starting this today. Parkinsonian-like movements: I reviewed the patient's medication list with Marylynn Pearson, CPP.  There is a category D interaction between the patient's Latuda and Mirapex.  I reached out to [facility] about this interaction and whether she thought it could be related to her parkinsonian-like movements.  I also placed a referral to our neuro oncologist, Dr. Elissa Hefty for his opinion on this.  Doris Smith will return in 2 weeks for labs and f/u.

## 2023-02-19 NOTE — Progress Notes (Unsigned)
Palliative Medicine [facility]  [phone]) [phone] Fax:(336) [phone]   Name: Doris Smith Date: 02/19/2023 MRN: [id number]  DOB: Feb 14, 1951  Patient Care Team: [name], FNP as PCP - General (Family Medicine) Jodelle Gross, NP as PCP - Cardiology (Nurse Practitioner) Ovidio Kin, MD as Consulting Physician (General Surgery) Dorothy Puffer, MD as Consulting Physician (Radiation Oncology) Lysle Pearl, MD as Consulting Physician (Pulmonary Disease) Beverely Low, MD as Consulting Physician (Orthopedic Surgery) Jene Every, MD as Consulting Physician (Orthopedic Surgery) Oneta Rack, NP as Nurse Practitioner (Adult Health Nurse Practitioner) Pershing Proud, RN as Oncology Nurse Navigator Donnelly Angelica, RN as Oncology Nurse Navigator Glenna Fellows, MD as Consulting Physician (Plastic Surgery) Rollene Rotunda, MD as Consulting Physician (Cardiology) Rachel Moulds, MD as Medical Oncologist (Hematology and Oncology)    INTERVAL HISTORY: Doris Smith is a 72 y.o. female with oncologic medical history including estrogen receptor positive breast cancer(07/2018) s/p right mastectomy. PET scan 10/06/22 showing widespread osseous metastasis. Other pertinent history includes atrial fibrillation, asthma, OSA, GERD, fatty liver, fibromyalgia, osteoporosis, chronic headaches, anxiety, depression, and bipolar disorder. Underwent ORIF of right hip 09/20/2022. Palliative ask to see for symptom and pain management and goals of care.   SOCIAL HISTORY:    Mrs. Bourget reports that she has never smoked. She has never used smokeless tobacco. She reports that she does not drink alcohol and does not use drugs.  ADVANCE DIRECTIVES:  Advanced directives on file, Livia Snellen Micallef, spouse, was identified as Management consultant should patient be unable to make decision for herself   CODE STATUS: Full code  PAST MEDICAL HISTORY: Past Medical  History:  Diagnosis Date   A-fib (HCC) 11/2012   PAF   Anxiety    takes Ativan   Asthma 04/07/2011   dx   Bipolar disorder (HCC)    Cancer (HCC)    Right breast   Depression    Early cataracts, bilateral    Fatty liver    Fibromyalgia    GERD (gastroesophageal reflux disease)    Irritable bowel syndrome    Mental disorder    dx bipolar   PONV (postoperative nausea and vomiting)    Sleep apnea 04/2011    does not wear CPAP    ALLERGIES:  is allergic to iohexol, codeine, palonosetron, prednisone, sulfonamide derivatives, celecoxib, sulfa antibiotics, and vitamin b12.  MEDICATIONS:  Current Outpatient Medications  Medication Sig Dispense Refill   apixaban (ELIQUIS) 5 MG TABS tablet Take 1 tablet (5 mg total) by mouth 2 (two) times daily. 180 tablet 1   clotrimazole (MYCELEX) 10 MG troche Take 1 tablet (10 mg total) by mouth 5 (five) times daily. 50 Troche 0   clotrimazole-betamethasone (LOTRISONE) cream APPLY  CREAM TOPICALLY TWICE DAILY 45 g 0   esomeprazole (NEXIUM) 40 MG capsule Take 1 capsule (40 mg total) by mouth daily. 90 capsule 0   fluticasone (FLONASE) 50 MCG/ACT nasal spray Place 2 sprays into both nostrils daily. 16 g 6   furosemide (LASIX) 20 MG tablet Take 1 tablet (20 mg total) by mouth daily. 90 tablet 1   Incontinence Supply Disposable (CERTAINTY FITTED BRIEFS XL) MISC 1 Package by Does not apply route as needed. 56 each 3   lidocaine (LIDODERM) 5 % Place 2 patches onto the skin daily. Remove & Discard patch within 12 hours or as directed by MD 60 patch 0   loratadine (CLARITIN) 10 MG tablet Take 10 mg by mouth daily.  Lurasidone HCl (LATUDA) 60 MG TABS TAKE 1 TABLET BY MOUTH ONCE DAILY 90 tablet 0   magic mouthwash (nystatin, diphenhydrAMINE, alum & mag hydroxide) suspension mixture Swish and swallow 5 mLs 4 (four) times daily as needed for mouth pain. 240 mL 1   metoprolol tartrate (LOPRESSOR) 25 MG tablet Take 1 tablet (25 mg total) by mouth 2 (two) times  daily. 180 tablet 1   midodrine (PROAMATINE) 2.5 MG tablet Take 1 tablet (2.5 mg total) by mouth 3 (three) times daily with meals. 270 tablet 3   ondansetron (ZOFRAN) 8 MG tablet Take 1 tablet (8 mg total) by mouth every 8 (eight) hours as needed for nausea. 30 tablet 3   oxyCODONE (OXYCONTIN) 10 mg 12 hr tablet Take 1 tablet (10 mg total) by mouth every 12 (twelve) hours. 60 tablet 0   oxyCODONE-acetaminophen (PERCOCET) 7.5-325 MG tablet Take 1 tablet by mouth every 4 (four) hours as needed for severe pain. 60 tablet 0   palbociclib (IBRANCE) 75 MG tablet Take 1 tablet (75 mg total) by mouth daily. Take for 21 days on, 7 days off, repeat every 28 days. 21 tablet 3   polyethylene glycol (MIRALAX / GLYCOLAX) 17 g packet Take 17 g by mouth daily.     pramipexole (MIRAPEX) 1 MG tablet Take 1 tablet (1 mg total) by mouth 3 (three) times daily. 90 tablet 0   prochlorperazine (COMPAZINE) 10 MG tablet TAKE 1 TABLET BY MOUTH EVERY 8 HOURS AS NEEDED FOR NAUSEA FOR VOMITING 30 tablet 0   triamcinolone (KENALOG) 0.1 % Apply 1 application. topically 2 (two) times daily.     Vilazodone HCl (VIIBRYD) 40 MG TABS Take 1 tablet (40 mg total) by mouth daily. 90 tablet 1   No current facility-administered medications for this visit.   Facility-Administered Medications Ordered in Other Visits  Medication Dose Route Frequency Provider Last Rate Last Admin   heparin lock flush 100 unit/mL  500 Units Intravenous Once Magrinat, Valentino Hue, MD       sodium chloride flush (NS) 0.9 % injection 10 mL  10 mL Intravenous PRN Magrinat, Valentino Hue, MD        VITAL SIGNS: There were no vitals taken for this visit. There were no vitals filed for this visit.    Estimated body mass index is 35.29 kg/m as calculated from the following:   Height as of 01/24/23: 5\' 4"  (1.626 m).   Weight as of 02/15/23: 205 lb 9.6 oz (93.3 kg).   PERFORMANCE STATUS (ECOG) : 2 - Symptomatic, <50% confined to bed   Physical Exam: General: NAD,  sitting in wheelchair  Cardiovascular: regular rate and rhythm Pulmonary: normal breathing pattern  Abdomen: soft, nontender, + bowel sounds Extremities: trace lower extremity edema, no joint deformities, continuous parkinson-like tardive movements Neurological: AAO x4  IMPRESSION: Mrs. Caiazzo presents to clinic for follow-up. Her husband is present. No acute distress. Denies nausea, vomiting, constipation, or diarrhea. Continues to show signs of tardive dyskinesia type movements.   Neoplasm related pain Mrs. Mcdougle reports her pain is overall improved. Some days are better than others.   We discussed her regimen at length.  OxyContin every 12 hours.  Percocet every 6-8 hours as needed for breakthrough pain. Does not require daily. Tolerating gabapentin 300 mg twice daily and 600mg  at bedtime. Lidocaine patches for additional relief.  She has her medication bottles. All medication counts are accurate. Patient and husband reports she is tolerating as prescribed. Will continue to closely monitor.  2.   Constipation/Nausea Controlled with medication  3.   Goals of Care  5/23: Mrs. Winget is emotional. Her husband becomes emotional as patient shares she knows her health is not the best and her cancer is not curable. She is taking life one day at time. Speaks to when her time comes she will be ready but hopeful it is not now. She knows that no one has a definitive time frame. Expresses her appreciation in all of the care and support. Expresses her goal is to continue to treat the treatable allowing her the ability to continue to thrive while not suffering.   4/30 ;Since the night of 10/30/22, Mrs. Broadwell has continued to meet goal of lying in her own bed at night beside her husband. She expresses appreciation for the care of everyone at the cancer center and is hopeful for continued improvement.  We discussed Her current illness and what it means in the larger context of her on-going co-morbidities. Natural  disease trajectory and expectations were discussed. We discussed the importance of continued conversation with family and their medical providers regarding overall plan of care and treatment options, ensuring decisions are within the context of the patients values and GOCs.  PLAN: OxyContin 15 mg PO every 12 hours.  Gabapentin 300 mg every morning, 300 mg mid-day, and 600 mg at bedtime.  Lidocaine patches to back and hip Percocet 7.5/325 every 4-6 hours as needed for breakthrough pain.  Not requiring around the clock Miralax for constipation. Palliative will plan to see patient back in 2-3 weeks in collaboration to other oncology appointments   Patient expressed understanding and was in agreement with this plan. She also understands that she an call the clinic at any time with any questions, concerns, or complaints.   Any controlled substances utilized were prescribed in the context of palliative care. PDMP has been reviewed.    Visit consisted of counseling and education dealing with the complex and emotionally intense issues of symptom management and palliative care in the setting of serious and potentially life-threatening illness.Greater than 50%  of this time was spent counseling and coordinating care related to the above assessment and plan.  Willette Alma, AGPCNP-BC  Palliative Medicine Team/[facility]  *Please note that this is a verbal dictation therefore any spelling or grammatical errors are due to the "Dragon Medical One" system interpretation.

## 2023-02-20 DIAGNOSIS — F4310 Post-traumatic stress disorder, unspecified: Secondary | ICD-10-CM | POA: Diagnosis not present

## 2023-02-20 DIAGNOSIS — F312 Bipolar disorder, current episode manic severe with psychotic features: Secondary | ICD-10-CM | POA: Diagnosis not present

## 2023-02-20 DIAGNOSIS — F410 Panic disorder [episodic paroxysmal anxiety] without agoraphobia: Secondary | ICD-10-CM | POA: Diagnosis not present

## 2023-02-21 ENCOUNTER — Encounter: Payer: Self-pay | Admitting: Nurse Practitioner

## 2023-02-21 ENCOUNTER — Inpatient Hospital Stay: Payer: BC Managed Care – PPO

## 2023-02-21 ENCOUNTER — Inpatient Hospital Stay (HOSPITAL_BASED_OUTPATIENT_CLINIC_OR_DEPARTMENT_OTHER): Payer: BC Managed Care – PPO | Admitting: Nurse Practitioner

## 2023-02-21 VITALS — BP 97/39 | HR 68 | Temp 98.5°F | Wt 204.8 lb

## 2023-02-21 DIAGNOSIS — R110 Nausea: Secondary | ICD-10-CM | POA: Diagnosis not present

## 2023-02-21 DIAGNOSIS — Z170 Estrogen receptor positive status [ER+]: Secondary | ICD-10-CM

## 2023-02-21 DIAGNOSIS — I10 Essential (primary) hypertension: Secondary | ICD-10-CM | POA: Diagnosis not present

## 2023-02-21 DIAGNOSIS — E669 Obesity, unspecified: Secondary | ICD-10-CM | POA: Diagnosis not present

## 2023-02-21 DIAGNOSIS — F319 Bipolar disorder, unspecified: Secondary | ICD-10-CM | POA: Diagnosis not present

## 2023-02-21 DIAGNOSIS — F419 Anxiety disorder, unspecified: Secondary | ICD-10-CM | POA: Diagnosis not present

## 2023-02-21 DIAGNOSIS — Z79899 Other long term (current) drug therapy: Secondary | ICD-10-CM | POA: Diagnosis not present

## 2023-02-21 DIAGNOSIS — K589 Irritable bowel syndrome without diarrhea: Secondary | ICD-10-CM | POA: Diagnosis not present

## 2023-02-21 DIAGNOSIS — Z515 Encounter for palliative care: Secondary | ICD-10-CM

## 2023-02-21 DIAGNOSIS — C50411 Malignant neoplasm of upper-outer quadrant of right female breast: Secondary | ICD-10-CM | POA: Diagnosis not present

## 2023-02-21 DIAGNOSIS — I480 Paroxysmal atrial fibrillation: Secondary | ICD-10-CM | POA: Diagnosis not present

## 2023-02-21 DIAGNOSIS — G893 Neoplasm related pain (acute) (chronic): Secondary | ICD-10-CM

## 2023-02-21 DIAGNOSIS — Z882 Allergy status to sulfonamides status: Secondary | ICD-10-CM | POA: Diagnosis not present

## 2023-02-21 DIAGNOSIS — C7951 Secondary malignant neoplasm of bone: Secondary | ICD-10-CM | POA: Diagnosis not present

## 2023-02-21 DIAGNOSIS — Z90710 Acquired absence of both cervix and uterus: Secondary | ICD-10-CM | POA: Diagnosis not present

## 2023-02-21 DIAGNOSIS — Z7901 Long term (current) use of anticoagulants: Secondary | ICD-10-CM | POA: Diagnosis not present

## 2023-02-21 DIAGNOSIS — K219 Gastro-esophageal reflux disease without esophagitis: Secondary | ICD-10-CM | POA: Diagnosis not present

## 2023-02-21 MED ORDER — OXYCODONE HCL ER 10 MG PO T12A
10.0000 mg | EXTENDED_RELEASE_TABLET | Freq: Two times a day (BID) | ORAL | 0 refills | Status: DC
Start: 2023-02-21 — End: 2023-03-26

## 2023-02-21 MED ORDER — FULVESTRANT 250 MG/5ML IM SOSY
500.0000 mg | PREFILLED_SYRINGE | Freq: Once | INTRAMUSCULAR | Status: AC
  Administered 2023-02-21: 500 mg via INTRAMUSCULAR
  Filled 2023-02-21: qty 10

## 2023-02-22 ENCOUNTER — Other Ambulatory Visit: Payer: Self-pay | Admitting: Nurse Practitioner

## 2023-02-22 DIAGNOSIS — G2581 Restless legs syndrome: Secondary | ICD-10-CM

## 2023-03-01 ENCOUNTER — Telehealth: Payer: Self-pay | Admitting: *Deleted

## 2023-03-01 ENCOUNTER — Ambulatory Visit: Payer: BC Managed Care – PPO | Admitting: Internal Medicine

## 2023-03-01 ENCOUNTER — Inpatient Hospital Stay: Payer: BC Managed Care – PPO | Admitting: Internal Medicine

## 2023-03-01 NOTE — Telephone Encounter (Signed)
PC to patient regarding missed appointment today, patient states she called & canceled this appointment earlier this week.  She states she does not want to reschedule at this time, she wants to "wait a while."  Instructed patient to call when she is ready to reschedule with Dr Barbaraann Cao, she verbalizes understanding.

## 2023-03-14 NOTE — Progress Notes (Deleted)
Palliative Medicine [facility]  Telephone:(336) [phone] [phone]) [phone]   Name: Doris Smith Date: 03/14/2023 MRN: [id number]  DOB: 1950/11/09  Patient Care Team: [name], FNP as PCP - General (Family Medicine) Jodelle Gross, NP as PCP - Cardiology (Nurse Practitioner) Ovidio Kin, MD as Consulting Physician (General Surgery) Dorothy Puffer, MD as Consulting Physician (Radiation Oncology) Lysle Pearl, MD as Consulting Physician (Pulmonary Disease) Beverely Low, MD as Consulting Physician (Orthopedic Surgery) Jene Every, MD as Consulting Physician (Orthopedic Surgery) Oneta Rack, NP as Nurse Practitioner (Adult Health Nurse Practitioner) Pershing Proud, RN as Oncology Nurse Navigator Donnelly Angelica, RN as Oncology Nurse Navigator Glenna Fellows, MD as Consulting Physician (Plastic Surgery) Rollene Rotunda, MD as Consulting Physician (Cardiology) Rachel Moulds, MD as Medical Oncologist (Hematology and Oncology)   I connected with Doris Smith on 03/16/23 at  3:00 PM EDT by phone and verified that I am speaking with the correct person using two identifiers.   I discussed the limitations, risks, security and privacy concerns of performing an evaluation and management service by telemedicine and the availability of in-person appointments. I also discussed with the patient that there may be a patient responsible charge related to this service. The patient expressed understanding and agreed to proceed.   Other persons participating in the visit and their role in the encounter: n/a   Patient's location: home  Provider's location: [facility]   Chief Complaint: f/u of symptom management   INTERVAL HISTORY: Doris Smith is a 72 y.o. female with oncologic medical history including estrogen receptor positive breast cancer(07/2018) s/p right mastectomy. PET scan 10/06/22 showing widespread osseous metastasis. Other  pertinent history includes atrial fibrillation, asthma, OSA, GERD, fatty liver, fibromyalgia, osteoporosis, chronic headaches, anxiety, depression, and bipolar disorder. Underwent ORIF of right hip 09/20/2022. Palliative ask to see for symptom and pain management and goals of care.   SOCIAL HISTORY:    Doris Smith reports that she has never smoked. She has never used smokeless tobacco. She reports that she does not drink alcohol and does not use drugs.  ADVANCE DIRECTIVES:  Advanced directives on file, Doris Smith, spouse, was identified as Management consultant should patient be unable to make decision for herself   CODE STATUS: Full code  PAST MEDICAL HISTORY: Past Medical History:  Diagnosis Date   A-fib (HCC) 11/2012   PAF   Anxiety    takes Ativan   Asthma 04/07/2011   dx   Bipolar disorder (HCC)    Cancer (HCC)    Right breast   Depression    Early cataracts, bilateral    Fatty liver    Fibromyalgia    GERD (gastroesophageal reflux disease)    Irritable bowel syndrome    Mental disorder    dx bipolar   PONV (postoperative nausea and vomiting)    Sleep apnea 04/2011    does not wear CPAP    ALLERGIES:  is allergic to iohexol, codeine, palonosetron, prednisone, sulfonamide derivatives, celecoxib, sulfa antibiotics, and vitamin b12.  MEDICATIONS:  Current Outpatient Medications  Medication Sig Dispense Refill   apixaban (ELIQUIS) 5 MG TABS tablet Take 1 tablet (5 mg total) by mouth 2 (two) times daily. 180 tablet 1   clotrimazole (MYCELEX) 10 MG troche Take 1 tablet (10 mg total) by mouth 5 (five) times daily. 50 Troche 0   clotrimazole-betamethasone (LOTRISONE) cream APPLY  CREAM TOPICALLY TWICE DAILY 45 g 0   esomeprazole (NEXIUM) 40 MG capsule Take 1  capsule (40 mg total) by mouth daily. 90 capsule 0   fluticasone (FLONASE) 50 MCG/ACT nasal spray Place 2 sprays into both nostrils daily. 16 g 6   furosemide (LASIX) 20 MG tablet Take 1 tablet (20 mg total) by mouth daily. 90  tablet 1   Incontinence Supply Disposable (CERTAINTY FITTED BRIEFS XL) MISC 1 Package by Does not apply route as needed. 56 each 3   lidocaine (LIDODERM) 5 % Place 2 patches onto the skin daily. Remove & Discard patch within 12 hours or as directed by MD 60 patch 0   loratadine (CLARITIN) 10 MG tablet Take 10 mg by mouth daily.     Lurasidone HCl (LATUDA) 60 MG TABS TAKE 1 TABLET BY MOUTH ONCE DAILY 90 tablet 0   magic mouthwash (nystatin, diphenhydrAMINE, alum & mag hydroxide) suspension mixture Swish and swallow 5 mLs 4 (four) times daily as needed for mouth pain. 240 mL 1   metoprolol tartrate (LOPRESSOR) 25 MG tablet Take 1 tablet (25 mg total) by mouth 2 (two) times daily. 180 tablet 1   midodrine (PROAMATINE) 2.5 MG tablet Take 1 tablet (2.5 mg total) by mouth 3 (three) times daily with meals. 270 tablet 3   ondansetron (ZOFRAN) 8 MG tablet Take 1 tablet (8 mg total) by mouth every 8 (eight) hours as needed for nausea. 30 tablet 3   oxyCODONE (OXYCONTIN) 10 mg 12 hr tablet Take 1 tablet (10 mg total) by mouth every 12 (twelve) hours. 60 tablet 0   oxyCODONE-acetaminophen (PERCOCET) 7.5-325 MG tablet Take 1 tablet by mouth every 4 (four) hours as needed for severe pain. 60 tablet 0   palbociclib (IBRANCE) 75 MG tablet Take 1 tablet (75 mg total) by mouth daily. Take for 21 days on, 7 days off, repeat every 28 days. 21 tablet 3   polyethylene glycol (MIRALAX / GLYCOLAX) 17 g packet Take 17 g by mouth daily.     pramipexole (MIRAPEX) 1 MG tablet TAKE 1 TABLET BY MOUTH THREE TIMES DAILY 90 tablet 0   prochlorperazine (COMPAZINE) 10 MG tablet TAKE 1 TABLET BY MOUTH EVERY 8 HOURS AS NEEDED FOR NAUSEA FOR VOMITING 30 tablet 0   triamcinolone (KENALOG) 0.1 % Apply 1 application. topically 2 (two) times daily.     Vilazodone HCl (VIIBRYD) 40 MG TABS Take 1 tablet (40 mg total) by mouth daily. 90 tablet 1   No current facility-administered medications for this visit.   Facility-Administered  Medications Ordered in Other Visits  Medication Dose Route Frequency Provider Last Rate Last Admin   heparin lock flush 100 unit/mL  500 Units Intravenous Once Magrinat, Valentino Hue, MD       sodium chloride flush (NS) 0.9 % injection 10 mL  10 mL Intravenous PRN Magrinat, Valentino Hue, MD        VITAL SIGNS: There were no vitals taken for this visit. There were no vitals filed for this visit.    Estimated body mass index is 35.15 kg/m as calculated from the following:   Height as of 01/24/23: 5\' 4"  (1.626 m).   Weight as of 02/21/23: 204 lb 12.8 oz (92.9 kg).   PERFORMANCE STATUS (ECOG) : 2 - Symptomatic, <50% confined to bed   Physical Exam: General: NAD, sitting in wheelchair  Cardiovascular: regular rate and rhythm Pulmonary: normal breathing pattern  Abdomen: soft, nontender, + bowel sounds Extremities: trace lower extremity edema, no joint deformities, continuous parkinson-like tardive movements Neurological: AAO x4  IMPRESSION:   Neoplasm related pain Mrs.  Herbst reports her pain is overall improved. Some days are better than others.   We discussed her regimen at length.  OxyContin every 12 hours.  Percocet every 6-8 hours as needed for breakthrough pain. Does not require daily. Tolerating gabapentin 300 mg twice daily and 600mg  at bedtime. Lidocaine patches for additional relief.  She has her medication bottles. All medication counts are accurate. Patient and husband reports she is tolerating as prescribed. Will continue to closely monitor.    2.   Constipation/Nausea Controlled with medication  3.   Goals of Care  5/23: Mrs. Deloatch is emotional. Her husband becomes emotional as patient shares she knows her health is not the best and her cancer is not curable. She is taking life one day at time. Speaks to when her time comes she will be ready but hopeful it is not now. She knows that no one has a definitive time frame. Expresses her appreciation in all of the care and support.  Expresses her goal is to continue to treat the treatable allowing her the ability to continue to thrive while not suffering.   4/30 ;Since the night of 10/30/22, Mrs. Bialik has continued to meet goal of lying in her own bed at night beside her husband. She expresses appreciation for the care of everyone at the cancer center and is hopeful for continued improvement.  We discussed Her current illness and what it means in the larger context of her on-going co-morbidities. Natural disease trajectory and expectations were discussed. We discussed the importance of continued conversation with family and their medical providers regarding overall plan of care and treatment options, ensuring decisions are within the context of the patients values and GOCs.  PLAN: OxyContin 15 mg PO every 12 hours.  Gabapentin 300 mg every morning, 300 mg mid-day, and 600 mg at bedtime.  Lidocaine patches to back and hip Percocet 7.5/325 every 4-6 hours as needed for breakthrough pain.  Not requiring around the clock Miralax for constipation. Palliative will plan to see patient back in 2-3 weeks in collaboration to other oncology appointments   Patient expressed understanding and was in agreement with this plan. She also understands that she an call the clinic at any time with any questions, concerns, or complaints.   Any controlled substances utilized were prescribed in the context of palliative care. PDMP has been reviewed.    Visit consisted of counseling and education dealing with the complex and emotionally intense issues of symptom management and palliative care in the setting of serious and potentially life-threatening illness.Greater than 50%  of this time was spent counseling and coordinating care related to the above assessment and plan.  Willette Alma, AGPCNP-BC  Palliative Medicine Team/[facility]  *Please note that this is a verbal dictation therefore any spelling or grammatical errors  are due to the "Dragon Medical One" system interpretation.

## 2023-03-16 ENCOUNTER — Telehealth: Payer: Self-pay

## 2023-03-16 DIAGNOSIS — Z515 Encounter for palliative care: Secondary | ICD-10-CM

## 2023-03-16 DIAGNOSIS — Z7189 Other specified counseling: Secondary | ICD-10-CM

## 2023-03-16 DIAGNOSIS — C50411 Malignant neoplasm of upper-outer quadrant of right female breast: Secondary | ICD-10-CM

## 2023-03-16 NOTE — Telephone Encounter (Signed)
Pt called asking for help filling out lidocaine patch prescription, RN called pharmacy and confirmed pt pick up patches today. Pt called and updated, verbalized understanding, reviewed scheduled and pt verbalized understanding. Pt reported "feeling down" and became tearful, spoke with RN about her husband working and the stressors in her life at this time. Emotional support provided, pt confirmed she would like a social work consult to talk more about these things when they come up, order placed for LCSW, no further needs at this time.

## 2023-03-20 DIAGNOSIS — F4310 Post-traumatic stress disorder, unspecified: Secondary | ICD-10-CM | POA: Diagnosis not present

## 2023-03-20 DIAGNOSIS — F410 Panic disorder [episodic paroxysmal anxiety] without agoraphobia: Secondary | ICD-10-CM | POA: Diagnosis not present

## 2023-03-20 DIAGNOSIS — F312 Bipolar disorder, current episode manic severe with psychotic features: Secondary | ICD-10-CM | POA: Diagnosis not present

## 2023-03-21 ENCOUNTER — Inpatient Hospital Stay: Payer: BC Managed Care – PPO | Admitting: Nurse Practitioner

## 2023-03-21 ENCOUNTER — Ambulatory Visit: Payer: BC Managed Care – PPO

## 2023-03-21 ENCOUNTER — Inpatient Hospital Stay: Payer: BC Managed Care – PPO | Attending: Hematology and Oncology

## 2023-03-21 ENCOUNTER — Other Ambulatory Visit: Payer: Self-pay | Admitting: Nurse Practitioner

## 2023-03-21 VITALS — BP 118/72 | HR 96 | Temp 98.9°F | Resp 20

## 2023-03-21 DIAGNOSIS — Z170 Estrogen receptor positive status [ER+]: Secondary | ICD-10-CM | POA: Diagnosis not present

## 2023-03-21 DIAGNOSIS — Z5111 Encounter for antineoplastic chemotherapy: Secondary | ICD-10-CM | POA: Insufficient documentation

## 2023-03-21 DIAGNOSIS — C50411 Malignant neoplasm of upper-outer quadrant of right female breast: Secondary | ICD-10-CM | POA: Insufficient documentation

## 2023-03-21 DIAGNOSIS — Z9011 Acquired absence of right breast and nipple: Secondary | ICD-10-CM | POA: Insufficient documentation

## 2023-03-21 DIAGNOSIS — G2581 Restless legs syndrome: Secondary | ICD-10-CM

## 2023-03-21 MED ORDER — FULVESTRANT 250 MG/5ML IM SOSY
500.0000 mg | PREFILLED_SYRINGE | Freq: Once | INTRAMUSCULAR | Status: AC
  Administered 2023-03-21: 500 mg via INTRAMUSCULAR
  Filled 2023-03-21: qty 10

## 2023-03-22 ENCOUNTER — Other Ambulatory Visit: Payer: Self-pay | Admitting: Hematology and Oncology

## 2023-03-23 ENCOUNTER — Inpatient Hospital Stay: Payer: BC Managed Care – PPO | Admitting: Nurse Practitioner

## 2023-03-23 ENCOUNTER — Inpatient Hospital Stay: Payer: BC Managed Care – PPO

## 2023-03-26 ENCOUNTER — Other Ambulatory Visit: Payer: Self-pay

## 2023-03-26 DIAGNOSIS — Z515 Encounter for palliative care: Secondary | ICD-10-CM

## 2023-03-26 DIAGNOSIS — Z170 Estrogen receptor positive status [ER+]: Secondary | ICD-10-CM

## 2023-03-26 DIAGNOSIS — G893 Neoplasm related pain (acute) (chronic): Secondary | ICD-10-CM

## 2023-03-26 MED ORDER — OXYCODONE HCL ER 10 MG PO T12A: 10.0000 mg | EXTENDED_RELEASE_TABLET | Freq: Two times a day (BID) | ORAL | 0 refills | Status: DC

## 2023-03-26 NOTE — Telephone Encounter (Signed)
Pt called for pain medication refill. Patient preferred pharmacy does not have mediation so refill routed to another pharmacy and this RN confirmed with pharmacy that they have medication in stock. Pt verbalized understanding of refill instructions, no further needs at this time.

## 2023-03-27 ENCOUNTER — Inpatient Hospital Stay: Payer: BC Managed Care – PPO

## 2023-03-27 NOTE — Progress Notes (Signed)
[facility] CSW Progress Note  Clinical Child psychotherapist contacted patient by phone to follow up on emotional processing and needs for support. Patient and CSW discussed emotional impact of treatment and how the diagnosis has impacted her day to day life. CSW and Patient are scheduled for a follow up.    Marguerita Merles, LCSWA Clinical Social Worker [facility]

## 2023-04-02 ENCOUNTER — Ambulatory Visit: Payer: Medicare Other

## 2023-04-02 ENCOUNTER — Ambulatory Visit: Payer: Medicare Other | Admitting: Nurse Practitioner

## 2023-04-04 ENCOUNTER — Encounter: Payer: Self-pay | Admitting: Nurse Practitioner

## 2023-04-10 ENCOUNTER — Encounter: Payer: Self-pay | Admitting: General Practice

## 2023-04-10 ENCOUNTER — Inpatient Hospital Stay: Payer: BC Managed Care – PPO | Attending: Hematology and Oncology

## 2023-04-10 DIAGNOSIS — C50411 Malignant neoplasm of upper-outer quadrant of right female breast: Secondary | ICD-10-CM | POA: Insufficient documentation

## 2023-04-10 DIAGNOSIS — Z5111 Encounter for antineoplastic chemotherapy: Secondary | ICD-10-CM | POA: Insufficient documentation

## 2023-04-10 DIAGNOSIS — Z9011 Acquired absence of right breast and nipple: Secondary | ICD-10-CM | POA: Insufficient documentation

## 2023-04-10 DIAGNOSIS — Z79899 Other long term (current) drug therapy: Secondary | ICD-10-CM | POA: Insufficient documentation

## 2023-04-10 DIAGNOSIS — Z170 Estrogen receptor positive status [ER+]: Secondary | ICD-10-CM | POA: Insufficient documentation

## 2023-04-10 DIAGNOSIS — C7951 Secondary malignant neoplasm of bone: Secondary | ICD-10-CM | POA: Insufficient documentation

## 2023-04-10 NOTE — Progress Notes (Unsigned)
Palliative Medicine [facility]  [phone]) [phone] Fax:(336) [phone]   Name: Doris Smith Date: 04/10/2023 MRN: [id number]  DOB: February 06, 1951  Patient Care Team: [name], FNP as PCP - General (Family Medicine) Jodelle Gross, NP as PCP - Cardiology (Nurse Practitioner) Ovidio Kin, MD as Consulting Physician (General Surgery) Dorothy Puffer, MD as Consulting Physician (Radiation Oncology) Lysle Pearl, MD as Consulting Physician (Pulmonary Disease) Beverely Low, MD as Consulting Physician (Orthopedic Surgery) Jene Every, MD as Consulting Physician (Orthopedic Surgery) Oneta Rack, NP as Nurse Practitioner (Adult Health Nurse Practitioner) Pershing Proud, RN as Oncology Nurse Navigator Donnelly Angelica, RN as Oncology Nurse Navigator Glenna Fellows, MD as Consulting Physician (Plastic Surgery) Rollene Rotunda, MD as Consulting Physician (Cardiology) Rachel Moulds, MD as Medical Oncologist (Hematology and Oncology)   I connected with Doris Smith on 04/10/23 at  2:30 PM EDT by phone and verified that I am speaking with the correct person using two identifiers.   I discussed the limitations, risks, security and privacy concerns of performing an evaluation and management service by telemedicine and the availability of in-person appointments. I also discussed with the patient that there may be a patient responsible charge related to this service. The patient expressed understanding and agreed to proceed.   Other persons participating in the visit and their role in the encounter: n/a   Patient's location: home  Provider's location: [facility]   Chief Complaint: f/u of symptom management   INTERVAL HISTORY: Doris Smith is a 72 y.o. female with oncologic medical history including estrogen receptor positive breast cancer(07/2018) s/p right mastectomy. PET scan 10/06/22 showing widespread osseous metastasis. Other  pertinent history includes atrial fibrillation, asthma, OSA, GERD, fatty liver, fibromyalgia, osteoporosis, chronic headaches, anxiety, depression, and bipolar disorder. Underwent ORIF of right hip 09/20/2022. Palliative ask to see for symptom and pain management and goals of care.   SOCIAL HISTORY:    Doris Smith reports that she has never smoked. She has never used smokeless tobacco. She reports that she does not drink alcohol and does not use drugs.  ADVANCE DIRECTIVES:  Advanced directives on file, Doris Smith, spouse, was identified as Management consultant should patient be unable to make decision for herself   CODE STATUS: Full code  PAST MEDICAL HISTORY: Past Medical History:  Diagnosis Date   A-fib (HCC) 11/2012   PAF   Anxiety    takes Ativan   Asthma 04/07/2011   dx   Bipolar disorder (HCC)    Cancer (HCC)    Right breast   Depression    Early cataracts, bilateral    Fatty liver    Fibromyalgia    GERD (gastroesophageal reflux disease)    Irritable bowel syndrome    Mental disorder    dx bipolar   PONV (postoperative nausea and vomiting)    Sleep apnea 04/2011    does not wear CPAP    ALLERGIES:  is allergic to iohexol, codeine, palonosetron, prednisone, sulfonamide derivatives, celecoxib, sulfa antibiotics, and vitamin b12.  MEDICATIONS:  Current Outpatient Medications  Medication Sig Dispense Refill   apixaban (ELIQUIS) 5 MG TABS tablet Take 1 tablet (5 mg total) by mouth 2 (two) times daily. 180 tablet 1   clotrimazole (MYCELEX) 10 MG troche Take 1 tablet (10 mg total) by mouth 5 (five) times daily. 50 Troche 0   clotrimazole-betamethasone (LOTRISONE) cream APPLY  CREAM TOPICALLY TWICE DAILY 45 g 0   esomeprazole (NEXIUM) 40 MG capsule Take 1  capsule (40 mg total) by mouth daily. 90 capsule 0   fluticasone (FLONASE) 50 MCG/ACT nasal spray Place 2 sprays into both nostrils daily. 16 g 6   furosemide (LASIX) 20 MG tablet Take 1 tablet (20 mg total) by mouth daily. 90  tablet 1   Incontinence Supply Disposable (CERTAINTY FITTED BRIEFS XL) MISC 1 Package by Does not apply route as needed. 56 each 3   lidocaine (LIDODERM) 5 % Place 2 patches onto the skin daily. Remove & Discard patch within 12 hours or as directed by MD 60 patch 0   loratadine (CLARITIN) 10 MG tablet Take 10 mg by mouth daily.     Lurasidone HCl (LATUDA) 60 MG TABS TAKE 1 TABLET BY MOUTH ONCE DAILY 90 tablet 0   magic mouthwash (nystatin, diphenhydrAMINE, alum & mag hydroxide) suspension mixture Swish and swallow 5 mLs 4 (four) times daily as needed for mouth pain. 240 mL 1   metoprolol tartrate (LOPRESSOR) 25 MG tablet Take 1 tablet (25 mg total) by mouth 2 (two) times daily. 180 tablet 1   midodrine (PROAMATINE) 2.5 MG tablet Take 1 tablet (2.5 mg total) by mouth 3 (three) times daily with meals. 270 tablet 3   ondansetron (ZOFRAN) 8 MG tablet Take 1 tablet (8 mg total) by mouth every 8 (eight) hours as needed for nausea. 30 tablet 3   oxyCODONE (OXYCONTIN) 10 mg 12 hr tablet Take 1 tablet (10 mg total) by mouth every 12 (twelve) hours. 60 tablet 0   oxyCODONE-acetaminophen (PERCOCET) 7.5-325 MG tablet Take 1 tablet by mouth every 4 (four) hours as needed for severe pain. 60 tablet 0   palbociclib (IBRANCE) 75 MG tablet Take 1 tablet (75 mg total) by mouth daily. Take for 21 days on, 7 days off, repeat every 28 days. 21 tablet 3   polyethylene glycol (MIRALAX / GLYCOLAX) 17 g packet Take 17 g by mouth daily.     pramipexole (MIRAPEX) 1 MG tablet TAKE 1 TABLET BY MOUTH THREE TIMES DAILY 90 tablet 0   prochlorperazine (COMPAZINE) 10 MG tablet TAKE 1 TABLET BY MOUTH EVERY 8 HOURS AS NEEDED FOR NAUSEA FOR VOMITING 30 tablet 0   triamcinolone (KENALOG) 0.1 % Apply 1 application. topically 2 (two) times daily.     Vilazodone HCl (VIIBRYD) 40 MG TABS Take 1 tablet (40 mg total) by mouth daily. 90 tablet 1   No current facility-administered medications for this visit.   Facility-Administered  Medications Ordered in Other Visits  Medication Dose Route Frequency Provider Last Rate Last Admin   heparin lock flush 100 unit/mL  500 Units Intravenous Once Magrinat, Valentino Hue, MD       sodium chloride flush (NS) 0.9 % injection 10 mL  10 mL Intravenous PRN Magrinat, Valentino Hue, MD        VITAL SIGNS: There were no vitals taken for this visit. There were no vitals filed for this visit.    Estimated body mass index is 35.15 kg/m as calculated from the following:   Height as of 01/24/23: 5\' 4"  (1.626 m).   Weight as of 02/21/23: 204 lb 12.8 oz (92.9 kg).   PERFORMANCE STATUS (ECOG) : 2 - Symptomatic, <50% confined to bed   Physical Exam: General: NAD, sitting in wheelchair  Cardiovascular: regular rate and rhythm Pulmonary: normal breathing pattern  Abdomen: soft, nontender, + bowel sounds Extremities: trace lower extremity edema, no joint deformities, continuous parkinson-like tardive movements Neurological: AAO x4  IMPRESSION:   Neoplasm related pain Mrs.  Smith reports her pain is overall improved. Some days are better than others.   We discussed her regimen at length.  OxyContin every 12 hours.  Percocet every 6-8 hours as needed for breakthrough pain. Does not require daily. Tolerating gabapentin 300 mg twice daily and 600mg  at bedtime. Lidocaine patches for additional relief.  She has her medication bottles. All medication counts are accurate. Patient and husband reports she is tolerating as prescribed. Will continue to closely monitor.    2.   Constipation/Nausea Controlled with medication  3.   Goals of Care  5/23: Doris Smith is emotional. Her husband becomes emotional as patient shares she knows her health is not the best and her cancer is not curable. She is taking life one day at time. Speaks to when her time comes she will be ready but hopeful it is not now. She knows that no one has a definitive time frame. Expresses her appreciation in all of the care and support.  Expresses her goal is to continue to treat the treatable allowing her the ability to continue to thrive while not suffering.   4/30 ;Since the night of 10/30/22, Doris Smith has continued to meet goal of lying in her own bed at night beside her husband. She expresses appreciation for the care of everyone at the cancer center and is hopeful for continued improvement.  We discussed Her current illness and what it means in the larger context of her on-going co-morbidities. Natural disease trajectory and expectations were discussed. We discussed the importance of continued conversation with family and their medical providers regarding overall plan of care and treatment options, ensuring decisions are within the context of the patients values and GOCs.  PLAN: OxyContin 15 mg PO every 12 hours.  Gabapentin 300 mg every morning, 300 mg mid-day, and 600 mg at bedtime.  Lidocaine patches to back and hip Percocet 7.5/325 every 4-6 hours as needed for breakthrough pain.  Not requiring around the clock Miralax for constipation. Palliative will plan to see patient back in 2-3 weeks in collaboration to other oncology appointments   Patient expressed understanding and was in agreement with this plan. She also understands that she an call the clinic at any time with any questions, concerns, or complaints.   Any controlled substances utilized were prescribed in the context of palliative care. PDMP has been reviewed.    Visit consisted of counseling and education dealing with the complex and emotionally intense issues of symptom management and palliative care in the setting of serious and potentially life-threatening illness.Greater than 50%  of this time was spent counseling and coordinating care related to the above assessment and plan.  Willette Alma, AGPCNP-BC  Palliative Medicine Team/[facility]  *Please note that this is a verbal dictation therefore any spelling or grammatical errors  are due to the "Dragon Medical One" system interpretation.

## 2023-04-10 NOTE — Progress Notes (Signed)
[facility] Spiritual Care Note  Referred by Nadiyah Abdul/LCSW for spiritual support. Reached Ms Strand by phone to introduce Spiritual Care as part of her support team, providing reflective listening, emotional support, and affirmation of strengths, particularly her faith and work to practice a positive attitude.  We plan to follow up by phone next week, and she also has direct Spiritual Care number in case needs arise in between calls.   [address] Rush Barer, [place], [facility] Pager [phone] Voicemail [phone]

## 2023-04-10 NOTE — Progress Notes (Signed)
[facility] CSW Progress Note  Visual merchandiser met with patient to offer increased emotional support. Patient was emotional throughout the visit with periodic moments of crying. Patient expressed challenges with adjusting to not being active and expressed feelings of being isolated. Patient discussed desire to have increased social support, CSW explained Marketing executive and spiritual care, referral placed for spiritual care. CSW offered to patient possibility of developing newer hobbies to complete while at home, patient not interested at this time, but interests may change.  Follow up scheduled for two weeks  Marguerita Merles, [place] Clinical Social Worker [facility]

## 2023-04-11 ENCOUNTER — Encounter: Payer: Self-pay | Admitting: Nurse Practitioner

## 2023-04-11 ENCOUNTER — Inpatient Hospital Stay (HOSPITAL_BASED_OUTPATIENT_CLINIC_OR_DEPARTMENT_OTHER): Payer: BC Managed Care – PPO | Admitting: Nurse Practitioner

## 2023-04-11 DIAGNOSIS — G893 Neoplasm related pain (acute) (chronic): Secondary | ICD-10-CM

## 2023-04-11 DIAGNOSIS — R530 Neoplastic (malignant) related fatigue: Secondary | ICD-10-CM | POA: Diagnosis not present

## 2023-04-11 DIAGNOSIS — C50411 Malignant neoplasm of upper-outer quadrant of right female breast: Secondary | ICD-10-CM

## 2023-04-11 DIAGNOSIS — Z170 Estrogen receptor positive status [ER+]: Secondary | ICD-10-CM

## 2023-04-11 DIAGNOSIS — Z515 Encounter for palliative care: Secondary | ICD-10-CM

## 2023-04-13 ENCOUNTER — Telehealth: Payer: Self-pay | Admitting: Nurse Practitioner

## 2023-04-13 NOTE — Telephone Encounter (Signed)
Called and spoke with patient and she states that she has been having a hard time with her cancer lately and she would like something for her nerves. States that she used to take something but it was stopped. She states that she has already talked with her cancer Dr and they are working on getting her some meds started. They said they would call her back Monday. Advised patient if they can't give her something to let us know next week. Patient verbalized understanding

## 2023-04-16 ENCOUNTER — Telehealth: Payer: Self-pay | Admitting: *Deleted

## 2023-04-16 ENCOUNTER — Telehealth: Payer: Self-pay | Admitting: Hematology and Oncology

## 2023-04-16 NOTE — Telephone Encounter (Signed)
Patient needs to be seen to discuss meds

## 2023-04-16 NOTE — Telephone Encounter (Signed)
This RN received VM from the pt stating she is waiting on appts as discussed with Friday's call- note per that call pt stated she was feeling depressed and "kinda forgotten because I'm not coming in that often"  Note pt that call discussed her prior concern with transportation but she states as long as her appts are after 3 pm she will be able to make them.  Note pt is being seen our palliative provider for symptom management of pain issues.  She is receiving monthly injections (due this week ) and should also see oncologist as well.  Above in process of being scheduled.  Today pt left a VM stating she was waiting on call about what medications we can provide to help her with her anxiety ( pt's anxiety discussed on Friday but not in relation to requesting medications).  This RN noted per chart pt has Jordan ad Viibrand prescribed by her primary MD.  This RN called pt back and obtained her identified VM- informed to look at My Chart for appt schedules but also she should contact her primary MS for antianxiety medications due to the above medications being prescribed. For best outcome we do not want to prescribe medications that could interfere with above or not be appropriate.  This note will be forwarded to Dr [name] for review as in chart - pt called them stating she was asking this office for anti anxiety medications.

## 2023-04-16 NOTE — Telephone Encounter (Signed)
Called and spoke with patient and notified that cancer dr had contacted [place] via message and stated that she should get anxiety med through [place]. I advised patient she would need face to face visit. She states that her husband has to bring her and she would talk with him and for me to call her back tomorrow.

## 2023-04-17 ENCOUNTER — Encounter: Payer: Self-pay | Admitting: General Practice

## 2023-04-17 DIAGNOSIS — F312 Bipolar disorder, current episode manic severe with psychotic features: Secondary | ICD-10-CM | POA: Diagnosis not present

## 2023-04-17 DIAGNOSIS — F410 Panic disorder [episodic paroxysmal anxiety] without agoraphobia: Secondary | ICD-10-CM | POA: Diagnosis not present

## 2023-04-17 DIAGNOSIS — F4310 Post-traumatic stress disorder, unspecified: Secondary | ICD-10-CM | POA: Diagnosis not present

## 2023-04-17 NOTE — Progress Notes (Signed)
[facility] Spiritual Care Note  Followed up with [facility] by phone. She reports struggling today with significant neck pain ("it feels like someone is twisting my neck in two") and using paid medication, heating pad, and rest to manage it. She also reported losing ca 50 pounds since March/April 2024 and struggling with dry heaves upon attempting to eat and drink; referred to RD for follow-up support.  Provided empathic listening and social/emotional support. We plan to follow up in ca two weeks.   [address] Rush Barer, [place], [facility] Pager [phone] Voicemail [phone]

## 2023-04-17 NOTE — Telephone Encounter (Signed)
Pt calling. States she is returning mandys call from yesterday? Didn't see anything noted in chart from yesterday. Please call pt ASAP.

## 2023-04-17 NOTE — Telephone Encounter (Signed)
Called and spoke with patient. Appt made for later this month per patients request

## 2023-04-18 ENCOUNTER — Telehealth: Payer: Self-pay

## 2023-04-18 NOTE — Telephone Encounter (Signed)
Pt called with concern about taking flu and PNA vaccine. She asks if it is safe for her to take while she is on [place]. Per MD, pt is encouraged to have vaccines but should space them apart by one week. She verbalized thanks.  She also voiced concerns about insurance only allowing her to have 9 tabs of Ondansetron at a time. Called pt's phx who states without a PA she has a plan limitation. Message sent to our PA staff regarding this.

## 2023-04-19 ENCOUNTER — Inpatient Hospital Stay: Payer: BC Managed Care – PPO

## 2023-04-19 ENCOUNTER — Other Ambulatory Visit: Payer: Self-pay | Admitting: Nurse Practitioner

## 2023-04-19 ENCOUNTER — Telehealth: Payer: Self-pay

## 2023-04-19 DIAGNOSIS — G2581 Restless legs syndrome: Secondary | ICD-10-CM

## 2023-04-19 NOTE — Telephone Encounter (Signed)
Nutrition  Referral received from [place], [facility]-Chaplain.    Called patient but no answer.  Left message with callback number.    Doris Smith B. Freida Busman, RD, LDN Registered Dietitian [phone]

## 2023-04-19 NOTE — Progress Notes (Addendum)
Nutrition Assessment   Reason for Assessment:  Referral from [place].     ASSESSMENT:   72 year old female with metastatic breast cancer to the bone.  Past medical history of right mastectomy, GERD, afib, OSA, fatty liver, fibromyalgia, osteoporosis, anxiety, depression, bipolar.  Patient has stopped ibrance, taking faslodex.  Patient returned RD's call from earlier.    Patient reports that her taste buds are off from taking "that pill".  Has dry heaves at times.  Takes nausea medications.  Only able to eat a small amount.  Has had 3 bites of [place] sandwich today and 3 grapes and boost shake.  Takes medication with boost.  Milk products upset her stomach except for cheese.  Likes soft fruit like applesauce, peaches, pears.  Has poor dentition and unable to chew hard foods.     Medications: MMW, nexium, lasix, zofran, compazine, miralax   Labs: glucose 110, calcium 8.5   Anthropometrics:   Height: 64 inches Weight: 204 lb 12.8 oz on 8/14 Per chart 234 lb on 09/09/22 13% weight loss in the last 7 months BMI: 35  NUTRITION DIAGNOSIS:  Unintentional weight loss related to cancer related treatment side effects as evidenced by 13% weight loss in the last 7 months, taste alterations and poor po intake    INTERVENTION:  Discussed foods to choose with nausea.  Will mail handout to patient Encouraged 360 calorie boost shake at least daily Encouraged small frequent meals including foods high in protein Encouraged patient to try lactaid milk.  Patient interested in increasing calcium.  Will mail handout on calcium rich foods Will mail handout on strategies to help with taste change. Patient has contact information   MONITORING, EVALUATION, GOAL: weight trends, intake   Next Visit: phone call on Monday, Nov 11   Anett Ranker B. Freida Busman, RD, LDN Registered Dietitian [phone]

## 2023-04-20 ENCOUNTER — Inpatient Hospital Stay: Payer: BC Managed Care – PPO

## 2023-04-20 VITALS — BP 116/58 | HR 84 | Resp 16

## 2023-04-20 DIAGNOSIS — Z9011 Acquired absence of right breast and nipple: Secondary | ICD-10-CM | POA: Diagnosis not present

## 2023-04-20 DIAGNOSIS — Z5111 Encounter for antineoplastic chemotherapy: Secondary | ICD-10-CM | POA: Diagnosis not present

## 2023-04-20 DIAGNOSIS — C50411 Malignant neoplasm of upper-outer quadrant of right female breast: Secondary | ICD-10-CM | POA: Diagnosis not present

## 2023-04-20 DIAGNOSIS — C7951 Secondary malignant neoplasm of bone: Secondary | ICD-10-CM | POA: Diagnosis not present

## 2023-04-20 DIAGNOSIS — Z79899 Other long term (current) drug therapy: Secondary | ICD-10-CM | POA: Diagnosis not present

## 2023-04-20 DIAGNOSIS — Z170 Estrogen receptor positive status [ER+]: Secondary | ICD-10-CM | POA: Diagnosis not present

## 2023-04-20 MED ORDER — FULVESTRANT 250 MG/5ML IM SOSY
500.0000 mg | PREFILLED_SYRINGE | Freq: Once | INTRAMUSCULAR | Status: AC
  Administered 2023-04-20: 500 mg via INTRAMUSCULAR
  Filled 2023-04-20: qty 10

## 2023-04-24 ENCOUNTER — Inpatient Hospital Stay: Payer: BC Managed Care – PPO

## 2023-04-24 ENCOUNTER — Other Ambulatory Visit: Payer: Self-pay

## 2023-04-24 DIAGNOSIS — Z170 Estrogen receptor positive status [ER+]: Secondary | ICD-10-CM

## 2023-04-24 DIAGNOSIS — Z515 Encounter for palliative care: Secondary | ICD-10-CM

## 2023-04-24 DIAGNOSIS — G893 Neoplasm related pain (acute) (chronic): Secondary | ICD-10-CM

## 2023-04-24 MED ORDER — OXYCODONE HCL ER 10 MG PO T12A: 10.0000 mg | EXTENDED_RELEASE_TABLET | Freq: Two times a day (BID) | ORAL | 0 refills | Status: DC

## 2023-04-24 NOTE — Telephone Encounter (Signed)
Pt called for medication refill, see associated orders. Attempted to call her back to inform her, no answer, LVM

## 2023-04-25 ENCOUNTER — Other Ambulatory Visit: Payer: BC Managed Care – PPO

## 2023-04-25 ENCOUNTER — Ambulatory Visit: Payer: BC Managed Care – PPO | Admitting: Hematology and Oncology

## 2023-04-26 ENCOUNTER — Other Ambulatory Visit: Payer: Self-pay | Admitting: Nurse Practitioner

## 2023-04-26 ENCOUNTER — Other Ambulatory Visit (HOSPITAL_COMMUNITY): Payer: Self-pay

## 2023-04-26 ENCOUNTER — Other Ambulatory Visit: Payer: Self-pay

## 2023-04-26 ENCOUNTER — Inpatient Hospital Stay: Payer: BC Managed Care – PPO

## 2023-04-26 ENCOUNTER — Telehealth: Payer: Self-pay

## 2023-04-26 DIAGNOSIS — Z515 Encounter for palliative care: Secondary | ICD-10-CM

## 2023-04-26 DIAGNOSIS — Z170 Estrogen receptor positive status [ER+]: Secondary | ICD-10-CM

## 2023-04-26 DIAGNOSIS — G893 Neoplasm related pain (acute) (chronic): Secondary | ICD-10-CM

## 2023-04-26 MED ORDER — OXYCODONE HCL ER 10 MG PO T12A
10.0000 mg | EXTENDED_RELEASE_TABLET | Freq: Two times a day (BID) | ORAL | 0 refills | Status: DC
Start: 2023-04-26 — End: 2023-05-28
  Filled 2023-04-26: qty 60, 30d supply, fill #0

## 2023-04-26 NOTE — Progress Notes (Signed)
[facility] CSW Progress Note  Visual merchandiser  met with patient as scheduled for additional emotional support.  Patient has reported increased social outing with sisters and reduced pain due to increased pain management. Patient reported improvement in moods. CSW will follow up again in two weeks  Marguerita Merles, [place] Clinical Social Worker [facility]

## 2023-04-26 NOTE — Telephone Encounter (Signed)
Pt called because her oxycontin was out of stock at her pharmacy, medication was sent to [facility] which had the medication in stock. Pt notified and verbalized understanding.

## 2023-04-26 NOTE — Telephone Encounter (Signed)
Notified Patient of prior authorization approval for Oxycontin 10mg  Tablets. Medication ias approved through 07/09/2024. No other needs or concerns voiced at this time.

## 2023-04-27 ENCOUNTER — Other Ambulatory Visit (HOSPITAL_COMMUNITY): Payer: Self-pay

## 2023-04-27 ENCOUNTER — Encounter: Payer: Self-pay | Admitting: Hematology and Oncology

## 2023-04-29 NOTE — Progress Notes (Signed)
Palliative Medicine [facility]  [phone] [phone]   Name: Doris Smith Date: 04/29/2023 MRN: [id number]  DOB: 04/23/1951  Patient Care Team: [name], FNP as PCP - General (Family Medicine) Jodelle Gross, NP as PCP - Cardiology (Nurse Practitioner) Ovidio Kin, MD as Consulting Physician (General Surgery) Dorothy Puffer, MD as Consulting Physician (Radiation Oncology) Lysle Pearl, MD as Consulting Physician (Pulmonary Disease) Beverely Low, MD as Consulting Physician (Orthopedic Surgery) Jene Every, MD as Consulting Physician (Orthopedic Surgery) Oneta Rack, NP as Nurse Practitioner (Adult Health Nurse Practitioner) Pershing Proud, RN as Oncology Nurse Navigator Donnelly Angelica, RN as Oncology Nurse Navigator Glenna Fellows, MD as Consulting Physician (Plastic Surgery) Rollene Rotunda, MD as Consulting Physician (Cardiology) Rachel Moulds, MD as Medical Oncologist (Hematology and Oncology)   INTERVAL HISTORY: Doris Smith is a 72 y.o. female with oncologic medical history including estrogen receptor positive breast cancer(07/2018) s/p right mastectomy. PET scan 10/06/22 showing widespread osseous metastasis. Other pertinent history includes atrial fibrillation, asthma, OSA, GERD, fatty liver, fibromyalgia, osteoporosis, chronic headaches, anxiety, depression, and bipolar disorder. Underwent ORIF of right hip 09/20/2022. Palliative ask to see for symptom and pain management and goals of care.   SOCIAL HISTORY:    Doris Smith reports that she has never smoked. She has never used smokeless tobacco. She reports that she does not drink alcohol and does not use drugs.  ADVANCE DIRECTIVES:  Advanced directives on file, Livia Snellen Tri, spouse, was identified as Management consultant should patient be unable to make decision for herself   CODE STATUS: Full code  PAST MEDICAL HISTORY: Past Medical History:   Diagnosis Date   A-fib (HCC) 11/2012   PAF   Anxiety    takes Ativan   Asthma 04/07/2011   dx   Bipolar disorder (HCC)    Cancer (HCC)    Right breast   Depression    Early cataracts, bilateral    Fatty liver    Fibromyalgia    GERD (gastroesophageal reflux disease)    Irritable bowel syndrome    Mental disorder    dx bipolar   PONV (postoperative nausea and vomiting)    Sleep apnea 04/2011    does not wear CPAP    ALLERGIES:  is allergic to iohexol, codeine, palonosetron, prednisone, sulfonamide derivatives, celecoxib, sulfa antibiotics, and vitamin b12.  MEDICATIONS:  Current Outpatient Medications  Medication Sig Dispense Refill   apixaban (ELIQUIS) 5 MG TABS tablet Take 1 tablet (5 mg total) by mouth 2 (two) times daily. 180 tablet 1   clotrimazole (MYCELEX) 10 MG troche Take 1 tablet (10 mg total) by mouth 5 (five) times daily. 50 Troche 0   clotrimazole-betamethasone (LOTRISONE) cream APPLY  CREAM TOPICALLY TWICE DAILY 45 g 0   esomeprazole (NEXIUM) 40 MG capsule Take 1 capsule (40 mg total) by mouth daily. 90 capsule 0   fluticasone (FLONASE) 50 MCG/ACT nasal spray Place 2 sprays into both nostrils daily. 16 g 6   furosemide (LASIX) 20 MG tablet Take 1 tablet (20 mg total) by mouth daily. 90 tablet 1   Incontinence Supply Disposable (CERTAINTY FITTED BRIEFS XL) MISC 1 Package by Does not apply route as needed. 56 each 3   lidocaine (LIDODERM) 5 % Place 2 patches onto the skin daily. Remove & Discard patch within 12 hours or as directed by MD 60 patch 0   loratadine (CLARITIN) 10 MG tablet Take 10 mg by mouth daily.  Lurasidone HCl (LATUDA) 60 MG TABS TAKE 1 TABLET BY MOUTH ONCE DAILY 90 tablet 0   magic mouthwash (nystatin, diphenhydrAMINE, alum & mag hydroxide) suspension mixture Swish and swallow 5 mLs 4 (four) times daily as needed for mouth pain. 240 mL 1   metoprolol tartrate (LOPRESSOR) 25 MG tablet Take 1 tablet (25 mg total) by mouth 2 (two) times daily. 180  tablet 1   midodrine (PROAMATINE) 2.5 MG tablet Take 1 tablet (2.5 mg total) by mouth 3 (three) times daily with meals. 270 tablet 3   ondansetron (ZOFRAN) 8 MG tablet Take 1 tablet (8 mg total) by mouth every 8 (eight) hours as needed for nausea. 30 tablet 3   oxyCODONE (OXYCONTIN) 10 mg 12 hr tablet Take 1 tablet (10 mg total) by mouth every 12 (twelve) hours. 60 tablet 0   oxyCODONE-acetaminophen (PERCOCET) 7.5-325 MG tablet Take 1 tablet by mouth every 4 (four) hours as needed for severe pain. 60 tablet 0   palbociclib (IBRANCE) 75 MG tablet Take 1 tablet (75 mg total) by mouth daily. Take for 21 days on, 7 days off, repeat every 28 days. 21 tablet 3   polyethylene glycol (MIRALAX / GLYCOLAX) 17 g packet Take 17 g by mouth daily.     pramipexole (MIRAPEX) 1 MG tablet TAKE 1 TABLET BY MOUTH THREE TIMES DAILY 90 tablet 0   prochlorperazine (COMPAZINE) 10 MG tablet TAKE 1 TABLET BY MOUTH EVERY 8 HOURS AS NEEDED FOR NAUSEA FOR VOMITING 30 tablet 0   triamcinolone (KENALOG) 0.1 % Apply 1 application. topically 2 (two) times daily.     Vilazodone HCl (VIIBRYD) 40 MG TABS Take 1 tablet (40 mg total) by mouth daily. 90 tablet 1   No current facility-administered medications for this visit.   Facility-Administered Medications Ordered in Other Visits  Medication Dose Route Frequency Provider Last Rate Last Admin   heparin lock flush 100 unit/mL  500 Units Intravenous Once Magrinat, Valentino Hue, MD       sodium chloride flush (NS) 0.9 % injection 10 mL  10 mL Intravenous PRN Magrinat, Valentino Hue, MD        VITAL SIGNS: There were no vitals taken for this visit. There were no vitals filed for this visit.    Estimated body mass index is 35.15 kg/m as calculated from the following:   Height as of 01/24/23: 5\' 4"  (1.626 m).   Weight as of 02/21/23: 204 lb 12.8 oz (92.9 kg).   PERFORMANCE STATUS (ECOG) : 2 - Symptomatic, <50% confined to bed  Discussed the use of AI scribe software for clinical note  transcription with the patient, who gave verbal consent to proceed.   IMPRESSION:  The patient presents with a chief complaint of persistent and escalating pain, which she reports is not adequately managed by her current pain medication regimen. Her husband is present and sister is on speaker phone. She describes the pain as severe and states that her current medication, a half-dose of Oxycodone, is no longer effective. The patient has been taking this half-dose every four hours for the past few weeks. Significant tardive dyskinesia symptoms worst than previous visits.   In addition to the pain, the patient reports experiencing frequent dry heaves, which occur even when she is at rest. She has been taking two types of nausea medication, Zofran and Compazine, twice daily to manage this symptom. However, the patient and her caregiver express confusion about the correct dosing schedule for these medications.  Doris Smith also  reports a significant decrease in her appetite and fluid intake, expressing fear of vomiting after eating. She mentions a change in her taste perception, which has made eating less appealing. The patient's caregiver confirms that the patient has been eating and drinking less than usual.  The patient's medical history includes a previous diagnosis of cancer, for which she received chemotherapy. She is currently not on any chemotherapy medication. The patient is also on estrogen shots, and there are no reported restrictions on food or drink related to this medication.  Mr. Piotrowicz expresses concern about the patient's increased sleepiness and decreased activity level, fearing that the patient may become weaker. The patient agrees, expressing a strong desire to remain active and avoid being bedridden. She becomes emotional stating "I am not ready to die or give up!" Emotional support provided.   [name] is scheduled to see her primary care doctor later in the week, with the hope of addressing  her sleep issues and possibly adjusting her anxiety medication. The patient's caregiver also inquires about the possibility of the patient being dehydrated and the potential need for intravenous fluids in the near future if appetite does not increase.   In summary, the patient is experiencing uncontrolled pain, frequent dry heaves, decreased appetite and fluid intake, and increased sleepiness. These symptoms are causing significant distress and impacting the patient's quality of life. The patient and her caregiver are seeking effective management strategies for these symptoms.  We discussed her pain at length. She is tolerating her current regimen. I advised patient to take a whole tablet of her breakthrough medication for severe pain. We reviewed her frequency of medication. Given her reports of severe pain patient has not had a refill of breakthrough medication in almost 2 months. They are in agreement to try regimen as discussed. Family aware to reach out with any further questions or concerns. Husband feels some of her discomfort is also related to patient's lack of proper night's sleep including mix-up in days and nights.   We will continue to closely monitor and support.     Goals of Care  5/23: Doris Smith is emotional. Her husband becomes emotional as patient shares she knows her health is not the best and her cancer is not curable. She is taking life one day at time. Speaks to when her time comes she will be ready but hopeful it is not now. She knows that no one has a definitive time frame. Expresses her appreciation in all of the care and support. Expresses her goal is to continue to treat the treatable allowing her the ability to continue to thrive while not suffering.   4/30 ;Since the night of 10/30/22, Doris Smith has continued to meet goal of lying in her own bed at night beside her husband. She expresses appreciation for the care of everyone at the cancer center and is hopeful for continued  improvement.  We discussed Her current illness and what it means in the larger context of her on-going co-morbidities. Natural disease trajectory and expectations were discussed. We discussed the importance of continued conversation with family and their medical providers regarding overall plan of care and treatment options, ensuring decisions are within the context of the patients values and GOCs.  PLAN: Assessment & Plan Chronic Pain Increased pain level, current regimen of Oxycodone not providing adequate relief. Discussed increasing dose of Oxycodone to manage pain more effectively. -Increase Oxycodone to full pill as prescribed for pain control. -Consider increasing dose of long-acting Oxycodone if pain  remains uncontrolled after adjusting short-acting dose.  Nausea Persistent despite current regimen of Zofran and Compazine. Discussed optimizing timing and dosing of antiemetics. -Alternate Zofran and Compazine every 8 hours as needed, ensuring a 2-hour gap between each medication. -Consider discussing additional antiemetic options with primary care provider.  Dehydration Patient reports decreased fluid intake, dry mouth, and decreased urination. Lab results indicate mild dehydration. -Increase fluid intake, consider Gatorade for electrolyte replenishment. -Consider IV fluids if dehydration persists or worsens.  Anemia Hemoglobin slightly low at 8.2, patient has history of requiring transfusion when hemoglobin was 6.9. -Discuss potential need for iron supplementation or other interventions with primary care provider.  General Health Maintenance -Continue current regimen of estrogen shots, no dietary restrictions associated. -Consider protein powder or protein bars to supplement nutrition due to decreased appetite and altered taste. -Consider discussing sleep aid options with primary care provider due to poor sleep quality. -Follow up in 1 week to assess effectiveness of medication  adjustments.  Patient expressed understanding and was in agreement with this plan. She also understands that she an call the clinic at any time with any questions, concerns, or complaints.   Any controlled substances utilized were prescribed in the context of palliative care. PDMP has been reviewed.    Visit consisted of counseling and education dealing with the complex and emotionally intense issues of symptom management and palliative care in the setting of serious and potentially life-threatening illness.   Willette Alma, AGPCNP-BC  Palliative Medicine Team/[facility]  *Please note that this is a verbal dictation therefore any spelling or grammatical errors are due to the "[employer]agon [employer] One" system interpretation.

## 2023-04-30 ENCOUNTER — Inpatient Hospital Stay (HOSPITAL_BASED_OUTPATIENT_CLINIC_OR_DEPARTMENT_OTHER): Payer: BC Managed Care – PPO | Admitting: Nurse Practitioner

## 2023-04-30 ENCOUNTER — Inpatient Hospital Stay: Payer: BC Managed Care – PPO

## 2023-04-30 ENCOUNTER — Encounter: Payer: Self-pay | Admitting: Nurse Practitioner

## 2023-04-30 ENCOUNTER — Inpatient Hospital Stay: Payer: BC Managed Care – PPO | Admitting: Hematology and Oncology

## 2023-04-30 ENCOUNTER — Telehealth: Payer: Self-pay | Admitting: Hematology and Oncology

## 2023-04-30 ENCOUNTER — Encounter: Payer: Self-pay | Admitting: Hematology and Oncology

## 2023-04-30 ENCOUNTER — Other Ambulatory Visit (HOSPITAL_COMMUNITY): Payer: Self-pay

## 2023-04-30 VITALS — BP 99/71 | HR 80 | Temp 98.3°F | Resp 18 | Wt 182.0 lb

## 2023-04-30 DIAGNOSIS — Z170 Estrogen receptor positive status [ER+]: Secondary | ICD-10-CM | POA: Diagnosis not present

## 2023-04-30 DIAGNOSIS — R634 Abnormal weight loss: Secondary | ICD-10-CM

## 2023-04-30 DIAGNOSIS — R530 Neoplastic (malignant) related fatigue: Secondary | ICD-10-CM | POA: Diagnosis not present

## 2023-04-30 DIAGNOSIS — Z5111 Encounter for antineoplastic chemotherapy: Secondary | ICD-10-CM | POA: Diagnosis not present

## 2023-04-30 DIAGNOSIS — R630 Anorexia: Secondary | ICD-10-CM

## 2023-04-30 DIAGNOSIS — G4709 Other insomnia: Secondary | ICD-10-CM

## 2023-04-30 DIAGNOSIS — C50411 Malignant neoplasm of upper-outer quadrant of right female breast: Secondary | ICD-10-CM | POA: Diagnosis not present

## 2023-04-30 DIAGNOSIS — Z515 Encounter for palliative care: Secondary | ICD-10-CM

## 2023-04-30 DIAGNOSIS — Z79899 Other long term (current) drug therapy: Secondary | ICD-10-CM | POA: Diagnosis not present

## 2023-04-30 DIAGNOSIS — C7951 Secondary malignant neoplasm of bone: Secondary | ICD-10-CM | POA: Diagnosis not present

## 2023-04-30 DIAGNOSIS — G893 Neoplasm related pain (acute) (chronic): Secondary | ICD-10-CM

## 2023-04-30 DIAGNOSIS — R110 Nausea: Secondary | ICD-10-CM

## 2023-04-30 DIAGNOSIS — Z9011 Acquired absence of right breast and nipple: Secondary | ICD-10-CM | POA: Diagnosis not present

## 2023-04-30 LAB — COMPREHENSIVE METABOLIC PANEL
ALT: 22 U/L (ref 0–44)
AST: 29 U/L (ref 15–41)
Albumin: 2.9 g/dL — ABNORMAL LOW (ref 3.5–5.0)
Alkaline Phosphatase: 166 U/L — ABNORMAL HIGH (ref 38–126)
Anion gap: 7 (ref 5–15)
BUN: 7 mg/dL — ABNORMAL LOW (ref 8–23)
CO2: 33 mmol/L — ABNORMAL HIGH (ref 22–32)
Calcium: 8.9 mg/dL (ref 8.9–10.3)
Chloride: 97 mmol/L — ABNORMAL LOW (ref 98–111)
Creatinine, Ser: 0.52 mg/dL (ref 0.44–1.00)
GFR, Estimated: >60 mL/min (ref >60)
Glucose, Bld: 122 mg/dL — ABNORMAL HIGH (ref 70–99)
Potassium: 3.7 mmol/L (ref 3.5–5.1)
Sodium: 137 mmol/L (ref 135–145)
Total Bilirubin: 0.8 mg/dL (ref 0.3–1.2)
Total Protein: 6.3 g/dL — ABNORMAL LOW (ref 6.5–8.1)

## 2023-04-30 LAB — CBC WITH DIFFERENTIAL/PLATELET
Abs Immature Granulocytes: 0.05 10*3/uL (ref 0.00–0.07)
Basophils Absolute: 0 10*3/uL (ref 0.0–0.1)
Basophils Relative: 0 %
Eosinophils Absolute: 0.1 10*3/uL (ref 0.0–0.5)
Eosinophils Relative: 1 %
HCT: 26.4 % — ABNORMAL LOW (ref 36.0–46.0)
Hemoglobin: 8.2 g/dL — ABNORMAL LOW (ref 12.0–15.0)
Immature Granulocytes: 1 %
Lymphocytes Relative: 5 %
Lymphs Abs: 0.3 10*3/uL — ABNORMAL LOW (ref 0.7–4.0)
MCH: 27.9 pg (ref 26.0–34.0)
MCHC: 31.1 g/dL (ref 30.0–36.0)
MCV: 89.8 fL (ref 80.0–100.0)
Monocytes Absolute: 0.7 10*3/uL (ref 0.1–1.0)
Monocytes Relative: 11 %
Neutro Abs: 5.2 10*3/uL (ref 1.7–7.7)
Neutrophils Relative %: 82 %
Platelets: 296 10*3/uL (ref 150–400)
RBC: 2.94 MIL/uL — ABNORMAL LOW (ref 3.87–5.11)
RDW: 16.1 % — ABNORMAL HIGH (ref 11.5–15.5)
WBC: 6.3 10*3/uL (ref 4.0–10.5)
nRBC: 0.3 % — ABNORMAL HIGH (ref 0.0–0.2)

## 2023-04-30 NOTE — Patient Instructions (Signed)
Today we discussed your medications.   You can take the following medications as prescribed:    Oxycontin (Oxycodone extended-release) Take one tablet every 12 hours (every morning and every night)  Oxycodone-Acetaminophen (Percocet) Take one tablet every 6 hours as needed for severe pain   Ondansetron (Zofran) 8mg  Take one tablet every 8 hours as needed for dry heaves   Prochlorperazine (Compazine) 10 mg Take one tablet every 8 hours as needed for dry heaves or nausea   Premier Protein Shakes have different flavors.   6. To help with taste buds drink lemon water, hot tea with lemon and honey, ginger candies, lemon candy.

## 2023-04-30 NOTE — Telephone Encounter (Signed)
Patient is aware of rescheduled appointment times/dates for follow up appointment with [facility]; patient has also confirmed they will still be coming for scheduled appointments today on 04/30/2023

## 2023-05-01 ENCOUNTER — Inpatient Hospital Stay (HOSPITAL_BASED_OUTPATIENT_CLINIC_OR_DEPARTMENT_OTHER): Payer: BC Managed Care – PPO | Admitting: Hematology and Oncology

## 2023-05-01 ENCOUNTER — Other Ambulatory Visit: Payer: Self-pay

## 2023-05-01 VITALS — BP 110/62 | HR 97 | Temp 97.3°F | Resp 16

## 2023-05-01 DIAGNOSIS — Z79899 Other long term (current) drug therapy: Secondary | ICD-10-CM | POA: Diagnosis not present

## 2023-05-01 DIAGNOSIS — C7951 Secondary malignant neoplasm of bone: Secondary | ICD-10-CM | POA: Diagnosis not present

## 2023-05-01 DIAGNOSIS — Z170 Estrogen receptor positive status [ER+]: Secondary | ICD-10-CM

## 2023-05-01 DIAGNOSIS — Z9011 Acquired absence of right breast and nipple: Secondary | ICD-10-CM | POA: Diagnosis not present

## 2023-05-01 DIAGNOSIS — Z5111 Encounter for antineoplastic chemotherapy: Secondary | ICD-10-CM | POA: Diagnosis not present

## 2023-05-01 DIAGNOSIS — C50411 Malignant neoplasm of upper-outer quadrant of right female breast: Secondary | ICD-10-CM | POA: Diagnosis not present

## 2023-05-01 NOTE — Progress Notes (Signed)
[facility] Cancer Follow up:    [name], FNP [address] [place] [address]   DIAGNOSIS:  Cancer Staging  Malignant neoplasm of upper-outer quadrant of right breast in female, estrogen receptor positive (HCC) Staging form: Breast, AJCC 8th Edition - Pathologic: Stage IB (pT3, pN60mi, cM0, G2, ER+, PR+, HER2-) - Signed by Loa Socks, NP on 10/02/2018 Multigene prognostic tests performed: MammaPrint Histologic grading system: 3 grade system - Clinical stage from 12/28/2022: Stage IV (rcT2, cN0, cM1, G3, ER+, PR+, HER2-) - Signed by Rachel Moulds, MD on 12/28/2022 Stage prefix: Recurrence Histologic grading system: 3 grade system Laterality: Right Staged by: Pathologist and managing physician Stage used in treatment planning: Yes National guidelines used in treatment planning: Yes Type of national guideline used in treatment planning: NCCN   SUMMARY OF ONCOLOGIC HISTORY: Oncology History  Malignant neoplasm of upper-outer quadrant of right breast in female, estrogen receptor positive (HCC)  08/08/2018 Initial Diagnosis   status post right breast biopsy 08/02/2018 for a clinical T3 N0, stage IIA invasive lobular carcinoma, grade 2, estrogen receptor strongly positive, progesterone receptor 1% positive, with no HER-2 amplification and an MIB-1 of 1%.             (a) CT scan of the head and chest, without contrast 08/23/2018 showed nonspecific 0.4 cm left lower lobe pulmonary nodule, no definitive metastatic disease             (b) bone scan 08/23/2018 shows multiple spinal areas of abnormal uptake, but             (c) total spinal MRI 09/03/2018 finds bone scan findings to be secondary to degenerative disease, no evidence of metastatic disease.   08/2018 - 10/2022 Anti-estrogen oral therapy   Anastrozole; discontinued 10/14/2018 in preparation for chemotherapy, resumed October 2020   09/16/2018 Surgery   Right mastectomy [name]  [phone]): Invasive Lobular Carcinoma, 10.5 cm, grade 2, negative margins. 1 of 5 lymph nodes positive for carcinoma.   09/16/2018 Miscellaneous   MammaPrint "high risk" suggests a 5-year metastasis free survival of 93% with chemotherapy, with an absolute chemotherapy benefit in the greater than 12% range    09/16/2018 Miscellaneous   Caris testing on mastectomy sample (09/16/2018) showed stable MSI and proficient mismatch repair status, with a low mutational burden; BRCA 1 and 2 were not mutated, PI K3 was not mutated, ER B B2 was not mutated, and AKT 1 was not mutated. 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 Administration: 0.25 mg (11/05/2018), 0.25 mg (11/26/2018), 0.25 mg (12/17/2018), 0.25 mg (01/07/2019), 0.25 mg (01/28/2019), 0.25 mg (02/18/2019), 0.25 mg (03/11/2019), 0.25 mg (04/02/2019)  methotrexate (PF) chemo injection 84 mg, 39.8 mg/m2 = 84.5 mg, Intravenous,  Once, 5 of 5 cycles. Administration: 84 mg (11/05/2018), 84 mg (11/26/2018), 84 mg (12/17/2018), 84 mg (03/11/2019), 84 mg (04/02/2019)  pegfilgrastim-cbqv (UDENYCA) injection 6 mg, 6 mg, Subcutaneous, Once, 6 of 6 cycles. Administration: 6 mg (12/19/2018)  cyclophosphamide (CYTOXAN) 1,260 mg in sodium chloride 0.9 % 250 mL chemo infusion, 600 mg/m2 = 1,260 mg, Intravenous,  Once, 8 of 8 cycle. Administration: 1,260 mg (11/05/2018), 1,260 mg (11/26/2018), 1,260 mg (12/17/2018), 1,260 mg (01/07/2019), 1,260 mg (01/28/2019), 1,260 mg (02/18/2019), 1,260 mg (03/11/2019), 1,260 mg (04/02/2019)  fluorouracil (ADRUCIL) chemo injection 1,250  mg, 600 mg/m2 = 1,250 mg, Intravenous,  Once, 8 of 8 cycles. Administration: 1,250 mg (11/05/2018), 1,250 mg (11/26/2018), 1,250 mg (12/17/2018), 1,250 mg (01/07/2019), 1,250 mg (01/28/2019), 1,250 mg (02/18/2019), 1,250 mg (03/11/2019), 1,250 mg  (04/02/2019).    12/31/2018 - 02/19/2019 Radiation Therapy   The patient initially received a dose of 50.4 Gy in 28 fractions to the chest wall and supraclavicular region. This was delivered using a 3-D conformal, 4 field technique. The patient then received a boost to the mastectomy scar. This delivered an additional 10 Gy in 5 fractions using an en face electron field. The total dose was 60.4 Gy.   09/12/2022 Imaging   Bone scan on 09/12/2022 shows uptake in ribs, manubrium. Widespread osseous metastasis confirmed on PET scan that was completed on October 06, 2022. Right iliac crest biopsy demonstrated metastatic carcinoma consistent with patient's known breast carcinoma. ER 90% positive, PR 90% positive, Ki67 10%, HER2 negative.    10/10/2022 Treatment Plan Change   Faslodex beginning 10/10/2022; Ibrance beginning 12/20/2022; Zometa every 12 weeks 11/07/2022    10/23/2022 - 11/03/2022 Radiation Therapy   Palliative Radiation 10/23/2022-11/03/2022:  left chest wall, lower T spine, and right proximal hip/pelvis were each treated to 30 GY in 10 fractions.      CURRENT THERAPY: Faslodex, Mora Appl  INTERVAL HISTORY:  Kensington Luckenbach Gangl 72 y.o. female returns for follow-up and evaluation with her husband and sister.  The patient, with a history of breast cancer, presents with persistent nausea and decreased appetite. She reports that the nausea is so severe that it prevents her from eating regular meals. She tries to eat as little as possible to avoid feeling nauseous. She also reports a constant bad taste in her mouth, which further discourages her from eating. The patient's husband confirms that her nausea was worse when she was taking Ibrance, a medication for breast cancer. The patient stopped taking Ibrance due to a suspected allergic reaction causing a rash on her legs. However, the patient's nausea persists even after discontinuing Ibrance. The patient is currently receiving Faslodex injections for her  breast cancer. She also reports constipation, which she attributes to her nausea medication and pain medication. The patient expresses a desire to improve her condition and is willing to retry Ibrance to determine if it was indeed the cause of her allergic reaction.   Patient Active Problem List   Diagnosis Date Noted   Peripheral edema 12/29/2022   Restless leg syndrome 10/03/2021   Anxiety associated with depression 06/10/2020   Port-A-Cath in place 11/12/2018   Malignant neoplasm of upper-outer quadrant of right breast in female, estrogen receptor positive (HCC) 08/08/2018   Right knee pain 03/01/2015   Aortic atherosclerosis (HCC) 12/28/2014   Obesity (BMI 30-39.9) 04/09/2014   Insomnia due to stress 04/09/2014   Atrial fibrillation (HCC) 10/01/2013   GERD (gastroesophageal reflux disease) 10/01/2013   Bipolar disorder (HCC) 09/27/2007   Essential hypertension 09/27/2007   ALLERGIC RHINITIS 09/27/2007   IRRITABLE BOWEL SYNDROME 09/27/2007   Arthropathy 09/27/2007   DEGENERATIVE DISC DISEASE, LUMBOSACRAL SPINE 09/27/2007    is allergic to iohexol, codeine, palonosetron, prednisone, sulfonamide derivatives, celecoxib, sulfa antibiotics, and vitamin b12.  MEDICAL HISTORY: Past Medical History:  Diagnosis Date   A-fib (HCC) 11/2012   PAF   Anxiety    takes Ativan   Asthma 04/07/2011   dx   Bipolar disorder (HCC)    Cancer (HCC)    Right breast   Depression    Early cataracts, bilateral  Fatty liver    Fibromyalgia    GERD (gastroesophageal reflux disease)    Irritable bowel syndrome    Mental disorder    dx bipolar   PONV (postoperative nausea and vomiting)    Sleep apnea 04/2011    does not wear CPAP    SURGICAL HISTORY: Past Surgical History:  Procedure Laterality Date   ABDOMINAL HYSTERECTOMY     COLONOSCOPY     DILATION AND CURETTAGE OF UTERUS  1981   abnormal pap   KNEE ARTHROSCOPY Left 2007   x 2   LUMBAR LAMINECTOMY/DECOMPRESSION MICRODISCECTOMY   05/31/2011   Procedure: LUMBAR LAMINECTOMY/DECOMPRESSION MICRODISCECTOMY;  Surgeon: Javier Docker;  Location: [facility];  Service: Orthopedics;  Laterality: Left;  Decompression Lumbar four to five and  Lumbar five to Sacral one on Left  (X-Ray)   MASTECTOMY W/ SENTINEL NODE BIOPSY Right 09/16/2018   Procedure: RIGHT MASTECTOMY WITH RIGHT AXILLARY SENTINEL LYMPH NODE BIOPSY;  Surgeon: Ovidio Kin, MD;  Location: [facility];  Service: General;  Laterality: Right;   PARTIAL HYSTERECTOMY  1982   PORTACATH PLACEMENT Left 10/31/2018   Procedure: INSERTION PORT-A-CATH WITH ULTRASOUND;  Surgeon: Ovidio Kin, MD;  Location: [facility];  Service: General;  Laterality: Left;   TONSILLECTOMY     as child   TOTAL KNEE ARTHROPLASTY Left 12/18/2005    SOCIAL HISTORY: Social History   Socioeconomic History   Marital status: Married    Spouse name: [name]   Number of children: 2   Years of education: 12   Highest education level: Not on file  Occupational History   Occupation: Disabled  Tobacco Use   Smoking status: Never   Smokeless tobacco: Never  Vaping Use   Vaping status: Never Used  Substance and Sexual Activity   Alcohol use: No   Drug use: No   Sexual activity: Yes    Birth control/protection: Post-menopausal, Surgical  Other Topics Concern   Not on file  Social History Narrative   Tea daily.  Rarely has caffeine    Social Determinants of Health   Financial Resource Strain: Low Risk  (08/18/2022)   Overall Financial Resource Strain (CARDIA)    Difficulty of Paying Living Expenses: Not hard at all  Food Insecurity: Low Risk  (09/25/2022)   [facility]   Hunger Vital Sign    Worried About Running Out of Food in the Last Year: Never true    Ran Out of Food in the Last Year: Never true  Transportation Needs: No Transportation Needs (10/04/2022)   [facility]   PRAPARE - Transportation    Lack of Transportation  (Medical): No    Lack of Transportation (Non-Medical): No  Physical Activity: Inactive (08/18/2022)   Exercise Vital Sign    Days of Exercise per Week: 0 days    Minutes of Exercise per Session: 0 min  Stress: No Stress Concern Present (08/18/2022)   [employer] of Occupational Health - Occupational Stress Questionnaire    Feeling of Stress : Not at all  Social Connections: Moderately Integrated (08/18/2022)   Social Connection and Isolation Panel [NHANES]    Frequency of Communication with Friends and Family: More than three times a week    Frequency of Social Gatherings with Friends and Family: More than three times a week    Attends Religious Services: More than 4 times per year    Active Member of [employer] or Organizations: No    Attends [employer]  or Organization Meetings: Never    Marital Status: Married  Catering manager Violence: Not At Risk (08/18/2022)   Humiliation, Afraid, Rape, and Kick questionnaire    Fear of Current or Ex-Partner: No    Emotionally Abused: No    Physically Abused: No    Sexually Abused: No    FAMILY HISTORY: Family History  Problem Relation Age of Onset   Anesthesia problems Mother    Heart disease Mother        CHF, atrial fib   Heart failure Mother    Anesthesia problems Sister    Colon polyps Father    Heart disease Father        "Fluid around the heart"   Hypertension Sister    Breast cancer Maternal Aunt    Colon cancer Maternal Aunt    Prostate cancer Neg Hx    Ovarian cancer Neg Hx     Review of Systems  Constitutional:  Positive for fatigue. Negative for appetite change, chills, fever and unexpected weight change.  HENT:   Negative for hearing loss, lump/mass and trouble swallowing.   Eyes:  Negative for eye problems and icterus.  Respiratory:  Negative for chest tightness, cough and shortness of breath.   Cardiovascular:  Negative for chest pain, leg swelling and palpitations.  Gastrointestinal:  Negative for abdominal distention,  abdominal pain, constipation, diarrhea, nausea and vomiting.  Endocrine: Negative for hot flashes.  Genitourinary:  Negative for difficulty urinating.   Musculoskeletal:  Negative for arthralgias.  Skin:  Negative for itching and rash.  Neurological:  Negative for dizziness, extremity weakness, headaches and numbness.  Hematological:  Negative for adenopathy. Does not bruise/bleed easily.  Psychiatric/Behavioral:  Negative for depression. The patient is not nervous/anxious.       PHYSICAL EXAMINATION     Vitals:   05/01/23 1544  BP: 110/62  Pulse: 97  Resp: 16  Temp: (!) 97.3 F (36.3 C)  SpO2: 100%    General appearance: Cachectic, lost weight, ongoing tardive dyskinesia movements like lip smacking No other acute distress  LABORATORY DATA:  CBC    Component Value Date/Time   WBC 6.3 04/30/2023 1532   RBC 2.94 (L) 04/30/2023 1532   HGB 8.2 (L) 04/30/2023 1532   HGB 12.0 06/05/2022 1024   HCT 26.4 (L) 04/30/2023 1532   HCT 37.4 06/05/2022 1024   PLT 296 04/30/2023 1532   PLT 251 06/05/2022 1024   MCV 89.8 04/30/2023 1532   MCV 86 06/05/2022 1024   MCH 27.9 04/30/2023 1532   MCHC 31.1 04/30/2023 1532   RDW 16.1 (H) 04/30/2023 1532   RDW 15.5 (H) 06/05/2022 1024   LYMPHSABS 0.3 (L) 04/30/2023 1532   LYMPHSABS 0.9 06/05/2022 1024   MONOABS 0.7 04/30/2023 1532   EOSABS 0.1 04/30/2023 1532   EOSABS 0.1 06/05/2022 1024   BASOSABS 0.0 04/30/2023 1532   BASOSABS 0.1 06/05/2022 1024    CMP     Component Value Date/Time   NA 137 04/30/2023 1532   NA 145 (H) 06/05/2022 1024   K 3.7 04/30/2023 1532   CL 97 (L) 04/30/2023 1532   CO2 33 (H) 04/30/2023 1532   GLUCOSE 122 (H) 04/30/2023 1532   BUN 7 (L) 04/30/2023 1532   BUN 17 06/05/2022 1024   CREATININE 0.52 04/30/2023 1532   CREATININE 1.07 (H) 08/25/2022 0921   CALCIUM 8.9 04/30/2023 1532   PROT 6.3 (L) 04/30/2023 1532   PROT 6.6 06/05/2022 1024   ALBUMIN 2.9 (L) 04/30/2023 1532  ALBUMIN 4.0 06/05/2022  1024   AST 29 04/30/2023 1532   AST 22 08/25/2022 0921   ALT 22 04/30/2023 1532   ALT 26 08/25/2022 0921   ALKPHOS 166 (H) 04/30/2023 1532   BILITOT 0.8 04/30/2023 1532   BILITOT 0.5 08/25/2022 0921   GFRNONAA >60 04/30/2023 1532   GFRNONAA 56 (L) 08/25/2022 0921   GFRAA 81 02/03/2020 1049   GFRAA >60 12/19/2018 1434     ASSESSMENT and THERAPY PLAN:   Malignant neoplasm of upper-outer quadrant of right breast in female, estrogen receptor positive (HCC) [name] is a 72 year old woman with metastatic breast cancer here today for follow-up and evaluation.  Metastatic breast cancer to the bone: She is tolerating Faslodex and Zometa well she will continue this.  Based on [name]'s note from August 8, patient was adamant about not taking Ibrance and she was worried Ibrance because some rash on her legs.  Breast Cancer Patient has been off Ibrance due to a suspected allergic reaction (rash). Currently on Faslodex injections. Unclear if worsening symptoms are due to disease progression -Order PET scan to assess disease status. -Attempt rechallenge with Ibrance to confirm if rash was from [place].   -Consider alternative medications like Kisqali or Verzenio if Ilda Foil is not tolerated. -I however do not believe she can tolerate any aggressive treatments given her underlying comorbidities and poor performance status to begin with. -Added CA 27-29 to labs drawn from yesterday.  Nausea and Poor Appetite Persistent nausea limiting food intake. Currently on Ondansetron twice daily. -Increase Ondansetron to every 8 hours as needed for nausea. -Encourage small, frequent meals.  Follow-up -Return visit on November 3rd for Faslodex injection and further management based on PET scan results and tolerance of Ibrance. -Order tumor markers if not already done with recent labs.   All questions were answered. The patient knows to call the clinic with any problems, questions or concerns. We can  certainly see the patient much sooner if necessary.  Total encounter time:40 minutes*in face-to-face visit time, chart review, lab review, care coordination, order entry, and documentation of the encounter time.   *Total Encounter Time as defined by the Centers for Medicare and Medicaid Services includes, in addition to the face-to-face time of a patient visit (documented in the note above) non-face-to-face time: obtaining and reviewing outside history, ordering and reviewing medications, tests or procedures, care coordination (communications with other health care professionals or caregivers) and documentation in the medical record.

## 2023-05-01 NOTE — Assessment & Plan Note (Signed)
Doris Smith is a 72 year old woman with metastatic breast cancer here today for follow-up and evaluation.  Metastatic breast cancer to the bone: She is tolerating Faslodex and Zometa well she will continue this.  Based on Doris Smith's note from August 8, patient was adamant about not taking Ibrance and she was worried Ibrance because some rash on her legs.  Breast Cancer Patient has been off Ibrance due to a suspected allergic reaction (rash). Currently on Faslodex injections. Unclear if worsening symptoms are due to disease progression -Order PET scan to assess disease status. -Attempt rechallenge with Ibrance to confirm if rash was from [place].   -Consider alternative medications like Kisqali or Verzenio if Doris Smith is not tolerated. -I however do not believe she can tolerate any aggressive treatments given her underlying comorbidities and poor performance status to begin with. -Added CA 27-29 to labs drawn from yesterday.  Nausea and Poor Appetite Persistent nausea limiting food intake. Currently on Ondansetron twice daily. -Increase Ondansetron to every 8 hours as needed for nausea. -Encourage small, frequent meals.  Follow-up -Return visit on November 3rd for Faslodex injection and further management based on PET scan results and tolerance of Ibrance. -Order tumor markers if not already done with recent labs.

## 2023-05-02 LAB — CANCER ANTIGEN 27.29: CA 27.29: 1246.4 U/mL — ABNORMAL HIGH (ref 0.0–38.6)

## 2023-05-03 ENCOUNTER — Encounter: Payer: Self-pay | Admitting: Nurse Practitioner

## 2023-05-03 ENCOUNTER — Other Ambulatory Visit: Payer: BC Managed Care – PPO

## 2023-05-03 ENCOUNTER — Ambulatory Visit (INDEPENDENT_AMBULATORY_CARE_PROVIDER_SITE_OTHER): Payer: BC Managed Care – PPO | Admitting: Nurse Practitioner

## 2023-05-03 ENCOUNTER — Telehealth: Payer: Self-pay | Admitting: *Deleted

## 2023-05-03 ENCOUNTER — Ambulatory Visit: Payer: BC Managed Care – PPO | Admitting: Nurse Practitioner

## 2023-05-03 VITALS — BP 134/76 | HR 72 | Temp 97.8°F | Resp 20

## 2023-05-03 DIAGNOSIS — F418 Other specified anxiety disorders: Secondary | ICD-10-CM | POA: Diagnosis not present

## 2023-05-03 DIAGNOSIS — R110 Nausea: Secondary | ICD-10-CM

## 2023-05-03 DIAGNOSIS — B370 Candidal stomatitis: Secondary | ICD-10-CM

## 2023-05-03 MED ORDER — ONDANSETRON HCL 8 MG PO TABS
8.0000 mg | ORAL_TABLET | Freq: Three times a day (TID) | ORAL | 5 refills | Status: DC | PRN
Start: 2023-05-03 — End: 2023-08-28

## 2023-05-03 MED ORDER — NYSTATIN 100000 UNIT/ML MT SUSP: 5.0000 mL | Freq: Four times a day (QID) | OROMUCOSAL | 0 refills | Status: DC

## 2023-05-03 MED ORDER — ESCITALOPRAM OXALATE 20 MG PO TABS: 20.0000 mg | ORAL_TABLET | Freq: Every day | ORAL | 5 refills | Status: DC

## 2023-05-03 NOTE — Patient Instructions (Signed)

## 2023-05-03 NOTE — Progress Notes (Signed)
Subjective:    Patient ID: Doris Smith, female    DOB: [birth year], 72 y.o.   MRN: [id number]   Chief Complaint: anxiety  HPI  Patient has been through a lot the last several years. She had breast cancer then developed atrial fib. This has increased her anxiety. She was  on latuda and viibryd, but she stopped taking it all.. But her anxiety is worsened. She worries all the time and has trouble relaxing. Also having lots of nausea and decreased appetite. Zofran helps but insurance will only give her 9 tablets at a time. Cancer treatments are making her sick Tongue is raw feeling     05/03/2023   10:56 AM 12/29/2022   11:56 AM 10/12/2022   10:30 AM 08/04/2022    1:54 PM  GAD 7 : Generalized Anxiety Score  Nervous, Anxious, on Edge 3 2 2 2   Control/stop worrying 3 1 1 1   Worry too much - different things 3 2 1 1   Trouble relaxing 3 3 3 2   Restless 3 3 2 1   Easily annoyed or irritable 2 2 1 1   Afraid - awful might happen 2 0 0 0  Total GAD 7 Score 19 13 10 8   Anxiety Difficulty Extremely difficult Not difficult at all Somewhat difficult Not difficult at all       05/03/2023   10:56 AM 12/29/2022   11:55 AM 10/12/2022   10:27 AM  Depression screen PHQ 2/9  Decreased Interest 2 2 2   Down, Depressed, Hopeless 3 2 2   PHQ - 2 Score 5 4 4   Altered sleeping 3 1 2   Tired, decreased energy 3 2 2   Change in appetite 3 1 2   Feeling bad or failure about yourself  0 1 0  Trouble concentrating 3 1 1   Moving slowly or fidgety/restless 3 1 0  Suicidal thoughts 0 0 0  PHQ-9 Score 20 11 11   Difficult doing work/chores Extremely dIfficult Somewhat difficult Somewhat difficult    Patient Active Problem List   Diagnosis Date Noted   Peripheral edema 12/29/2022   Restless leg syndrome 10/03/2021   Anxiety associated with depression 06/10/2020   Port-A-Cath in place 11/12/2018   Malignant neoplasm of upper-outer quadrant of right breast in female, estrogen receptor positive (HCC)  08/08/2018   Right knee pain 03/01/2015   Aortic atherosclerosis (HCC) 12/28/2014   Obesity (BMI 30-39.9) 04/09/2014   Insomnia due to stress 04/09/2014   Atrial fibrillation (HCC) 10/01/2013   GERD (gastroesophageal reflux disease) 10/01/2013   Bipolar disorder (HCC) 09/27/2007   Essential hypertension 09/27/2007   ALLERGIC RHINITIS 09/27/2007   IRRITABLE BOWEL SYNDROME 09/27/2007   Arthropathy 09/27/2007   DEGENERATIVE DISC DISEASE, LUMBOSACRAL SPINE 09/27/2007       Review of Systems  Constitutional:  Negative for diaphoresis.  Eyes:  Negative for pain.  Respiratory:  Negative for shortness of breath.   Cardiovascular:  Negative for chest pain, palpitations and leg swelling.  Gastrointestinal:  Negative for abdominal pain.  Endocrine: Negative for polydipsia.  Skin:  Negative for rash.  Neurological:  Negative for dizziness, weakness and headaches.  Hematological:  Does not bruise/bleed easily.  All other systems reviewed and are negative.      Objective:   Physical Exam Vitals and nursing note reviewed.  Constitutional:      General: She is not in acute distress.    Appearance: Normal appearance. She is well-developed.  Neck:     Vascular: No carotid bruit or JVD.  Cardiovascular:     Rate and Rhythm: Normal rate and regular rhythm.     Heart sounds: Normal heart sounds.  Pulmonary:     Effort: Pulmonary effort is normal. No respiratory distress.     Breath sounds: Normal breath sounds. No wheezing or rales.  Chest:     Chest wall: No tenderness.  Abdominal:     General: Bowel sounds are normal. There is no distension or abdominal bruit.     Palpations: Abdomen is soft. There is no hepatomegaly, splenomegaly, mass or pulsatile mass.     Tenderness: There is no abdominal tenderness.  Musculoskeletal:        General: Normal range of motion.     Cervical back: Normal range of motion and neck supple.  Lymphadenopathy:     Cervical: No cervical adenopathy.   Skin:    General: Skin is warm and dry.  Neurological:     Mental Status: She is alert and oriented to person, place, and time.     Deep Tendon Reflexes: Reflexes are normal and symmetric.  Psychiatric:        Behavior: Behavior normal.        Thought Content: Thought content normal.        Judgment: Judgment normal.    BP 134/76   Pulse 72   Temp 97.8 F (36.6 C) (Oral)   Resp 20   SpO2 97%         Assessment & Plan:   Doris Smith in today with chief complaint of No chief complaint on file.   1. Oral candidiasis Eat yogurt - nystatin (MYCOSTATIN) 100000 UNIT/ML suspension; Take 5 mLs (500,000 Units total) by mouth 4 (four) times daily.  Dispense: 60 mL; Refill: 0  2. Anxiety associated with depression Stress management Added lexapro - escitalopram (LEXAPRO) 20 MG tablet; Take 1 tablet (20 mg total) by mouth daily.  Dispense: 30 tablet; Refill: 5  3. Nausea Will need to override medication amount with insurance Avoid spicy and fatty foods - ondansetron (ZOFRAN) 8 MG tablet; Take 1 tablet (8 mg total) by mouth every 8 (eight) hours as needed for nausea or vomiting.  Dispense: 30 tablet; Refill: 5    The above assessment and management plan was discussed with the patient. The patient verbalized understanding of and has agreed to the management plan. Patient is aware to call the clinic if symptoms persist or worsen. Patient is aware when to return to the clinic for a follow-up visit. Patient educated on when it is appropriate to go to the emergency department.   [name] Daphine Deutscher, FNP

## 2023-05-04 ENCOUNTER — Encounter: Payer: Self-pay | Admitting: Hematology and Oncology

## 2023-05-04 NOTE — Telephone Encounter (Signed)
NO ENTRY 

## 2023-05-08 ENCOUNTER — Other Ambulatory Visit: Payer: Self-pay

## 2023-05-08 DIAGNOSIS — Z515 Encounter for palliative care: Secondary | ICD-10-CM

## 2023-05-08 DIAGNOSIS — G893 Neoplasm related pain (acute) (chronic): Secondary | ICD-10-CM

## 2023-05-08 DIAGNOSIS — R110 Nausea: Secondary | ICD-10-CM

## 2023-05-08 DIAGNOSIS — Z170 Estrogen receptor positive status [ER+]: Secondary | ICD-10-CM

## 2023-05-08 MED ORDER — PROCHLORPERAZINE MALEATE 10 MG PO TABS: 10.0000 mg | ORAL_TABLET | Freq: Four times a day (QID) | ORAL | 2 refills | Status: DC | PRN

## 2023-05-08 NOTE — Telephone Encounter (Signed)
Pt called for a refill on compazine, see associated orders

## 2023-05-10 ENCOUNTER — Other Ambulatory Visit: Payer: BC Managed Care – PPO

## 2023-05-11 ENCOUNTER — Telehealth: Payer: Self-pay | Admitting: Licensed Clinical Social Worker

## 2023-05-11 NOTE — Telephone Encounter (Signed)
[facility] Clinical Social Work  CSW attempted to contact pt by phone to introduce self and continue coping support per hand-off from N Adbul.  No answer. Left VM with direct contact information.   Avalon Coppinger E Caileigh Canche, LCSW

## 2023-05-15 ENCOUNTER — Encounter: Payer: Self-pay | Admitting: Nurse Practitioner

## 2023-05-16 ENCOUNTER — Encounter: Payer: Self-pay | Admitting: Hematology and Oncology

## 2023-05-16 ENCOUNTER — Telehealth: Payer: Self-pay

## 2023-05-16 ENCOUNTER — Inpatient Hospital Stay: Payer: BC Managed Care – PPO | Attending: Hematology and Oncology | Admitting: Hematology and Oncology

## 2023-05-16 ENCOUNTER — Inpatient Hospital Stay: Payer: BC Managed Care – PPO

## 2023-05-16 VITALS — BP 102/68 | HR 98 | Temp 97.3°F | Resp 16 | Wt 176.4 lb

## 2023-05-16 DIAGNOSIS — F419 Anxiety disorder, unspecified: Secondary | ICD-10-CM | POA: Insufficient documentation

## 2023-05-16 DIAGNOSIS — J90 Pleural effusion, not elsewhere classified: Secondary | ICD-10-CM | POA: Insufficient documentation

## 2023-05-16 DIAGNOSIS — I480 Paroxysmal atrial fibrillation: Secondary | ICD-10-CM | POA: Insufficient documentation

## 2023-05-16 DIAGNOSIS — M797 Fibromyalgia: Secondary | ICD-10-CM | POA: Diagnosis not present

## 2023-05-16 DIAGNOSIS — M8448XA Pathological fracture, other site, initial encounter for fracture: Secondary | ICD-10-CM | POA: Diagnosis not present

## 2023-05-16 DIAGNOSIS — G4733 Obstructive sleep apnea (adult) (pediatric): Secondary | ICD-10-CM | POA: Insufficient documentation

## 2023-05-16 DIAGNOSIS — K449 Diaphragmatic hernia without obstruction or gangrene: Secondary | ICD-10-CM | POA: Insufficient documentation

## 2023-05-16 DIAGNOSIS — G893 Neoplasm related pain (acute) (chronic): Secondary | ICD-10-CM | POA: Diagnosis not present

## 2023-05-16 DIAGNOSIS — C50411 Malignant neoplasm of upper-outer quadrant of right female breast: Secondary | ICD-10-CM | POA: Diagnosis not present

## 2023-05-16 DIAGNOSIS — I10 Essential (primary) hypertension: Secondary | ICD-10-CM | POA: Diagnosis not present

## 2023-05-16 DIAGNOSIS — Z1721 Progesterone receptor positive status: Secondary | ICD-10-CM | POA: Insufficient documentation

## 2023-05-16 DIAGNOSIS — D61818 Other pancytopenia: Secondary | ICD-10-CM | POA: Diagnosis not present

## 2023-05-16 DIAGNOSIS — E669 Obesity, unspecified: Secondary | ICD-10-CM | POA: Insufficient documentation

## 2023-05-16 DIAGNOSIS — Z8249 Family history of ischemic heart disease and other diseases of the circulatory system: Secondary | ICD-10-CM | POA: Insufficient documentation

## 2023-05-16 DIAGNOSIS — F319 Bipolar disorder, unspecified: Secondary | ICD-10-CM | POA: Insufficient documentation

## 2023-05-16 DIAGNOSIS — Z170 Estrogen receptor positive status [ER+]: Secondary | ICD-10-CM | POA: Diagnosis not present

## 2023-05-16 DIAGNOSIS — Z9011 Acquired absence of right breast and nipple: Secondary | ICD-10-CM | POA: Insufficient documentation

## 2023-05-16 DIAGNOSIS — K760 Fatty (change of) liver, not elsewhere classified: Secondary | ICD-10-CM | POA: Insufficient documentation

## 2023-05-16 DIAGNOSIS — Z90710 Acquired absence of both cervix and uterus: Secondary | ICD-10-CM | POA: Insufficient documentation

## 2023-05-16 DIAGNOSIS — Z7901 Long term (current) use of anticoagulants: Secondary | ICD-10-CM | POA: Insufficient documentation

## 2023-05-16 DIAGNOSIS — C7951 Secondary malignant neoplasm of bone: Secondary | ICD-10-CM | POA: Diagnosis not present

## 2023-05-16 DIAGNOSIS — Z803 Family history of malignant neoplasm of breast: Secondary | ICD-10-CM | POA: Insufficient documentation

## 2023-05-16 DIAGNOSIS — Z5111 Encounter for antineoplastic chemotherapy: Secondary | ICD-10-CM | POA: Diagnosis present

## 2023-05-16 DIAGNOSIS — J9811 Atelectasis: Secondary | ICD-10-CM | POA: Diagnosis not present

## 2023-05-16 DIAGNOSIS — K589 Irritable bowel syndrome without diarrhea: Secondary | ICD-10-CM | POA: Diagnosis not present

## 2023-05-16 DIAGNOSIS — Z79899 Other long term (current) drug therapy: Secondary | ICD-10-CM | POA: Diagnosis not present

## 2023-05-16 DIAGNOSIS — Z882 Allergy status to sulfonamides status: Secondary | ICD-10-CM | POA: Diagnosis not present

## 2023-05-16 DIAGNOSIS — Z83719 Family history of colon polyps, unspecified: Secondary | ICD-10-CM | POA: Insufficient documentation

## 2023-05-16 DIAGNOSIS — G2581 Restless legs syndrome: Secondary | ICD-10-CM | POA: Diagnosis not present

## 2023-05-16 DIAGNOSIS — Z885 Allergy status to narcotic agent status: Secondary | ICD-10-CM | POA: Insufficient documentation

## 2023-05-16 DIAGNOSIS — Z888 Allergy status to other drugs, medicaments and biological substances status: Secondary | ICD-10-CM | POA: Insufficient documentation

## 2023-05-16 DIAGNOSIS — R110 Nausea: Secondary | ICD-10-CM | POA: Insufficient documentation

## 2023-05-16 DIAGNOSIS — Z800 Family history of malignant neoplasm of digestive organs: Secondary | ICD-10-CM | POA: Insufficient documentation

## 2023-05-16 MED ORDER — FULVESTRANT 250 MG/5ML IM SOSY
500.0000 mg | PREFILLED_SYRINGE | Freq: Once | INTRAMUSCULAR | Status: AC
  Administered 2023-05-16: 500 mg via INTRAMUSCULAR
  Filled 2023-05-16: qty 10

## 2023-05-16 NOTE — Progress Notes (Signed)
[facility] Cancer Follow up:    Doris Pierini, FNP [address] [place] [address]   DIAGNOSIS:  Cancer Staging  Malignant neoplasm of upper-outer quadrant of right breast in female, estrogen receptor positive (HCC) Staging form: Breast, AJCC 8th Edition - Pathologic: Stage IB (pT3, pN47mi, cM0, G2, ER+, PR+, HER2-) - Signed by Loa Socks, NP on 10/02/2018 Multigene prognostic tests performed: MammaPrint Histologic grading system: 3 grade system - Clinical stage from 12/28/2022: Stage IV (rcT2, cN0, cM1, G3, ER+, PR+, HER2-) - Signed by Rachel Moulds, MD on 12/28/2022 Stage prefix: Recurrence Histologic grading system: 3 grade system Laterality: Right Staged by: Pathologist and managing physician Stage used in treatment planning: Yes National guidelines used in treatment planning: Yes Type of national guideline used in treatment planning: NCCN   SUMMARY OF ONCOLOGIC HISTORY: Oncology History  Malignant neoplasm of upper-outer quadrant of right breast in female, estrogen receptor positive (HCC)  08/08/2018 Initial Diagnosis   status post right breast biopsy 08/02/2018 for a clinical T3 N0, stage IIA invasive lobular carcinoma, grade 2, estrogen receptor strongly positive, progesterone receptor 1% positive, with no HER-2 amplification and an MIB-1 of 1%.             (a) CT scan of the head and chest, without contrast 08/23/2018 showed nonspecific 0.4 cm left lower lobe pulmonary nodule, no definitive metastatic disease             (b) bone scan 08/23/2018 shows multiple spinal areas of abnormal uptake, but             (c) total spinal MRI 09/03/2018 finds bone scan findings to be secondary to degenerative disease, no evidence of metastatic disease.   08/2018 - 10/2022 Anti-estrogen oral therapy   Anastrozole; discontinued 10/14/2018 in preparation for chemotherapy, resumed October 2020   09/16/2018 Surgery   Right mastectomy Doris Smith)  [phone]): Invasive Lobular Carcinoma, 10.5 cm, grade 2, negative margins. 1 of 5 lymph nodes positive for carcinoma.   09/16/2018 Miscellaneous   MammaPrint "high risk" suggests a 5-year metastasis free survival of 93% with chemotherapy, with an absolute chemotherapy benefit in the greater than 12% range    09/16/2018 Miscellaneous   Caris testing on mastectomy sample (09/16/2018) showed stable MSI and proficient mismatch repair status, with a low mutational burden; BRCA 1 and 2 were not mutated, PI K3 was not mutated, ER B B2 was not mutated, and AKT 1 was not mutated. 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 Administration: 0.25 mg (11/05/2018), 0.25 mg (11/26/2018), 0.25 mg (12/17/2018), 0.25 mg (01/07/2019), 0.25 mg (01/28/2019), 0.25 mg (02/18/2019), 0.25 mg (03/11/2019), 0.25 mg (04/02/2019)  methotrexate (PF) chemo injection 84 mg, 39.8 mg/m2 = 84.5 mg, Intravenous,  Once, 5 of 5 cycles. Administration: 84 mg (11/05/2018), 84 mg (11/26/2018), 84 mg (12/17/2018), 84 mg (03/11/2019), 84 mg (04/02/2019)  pegfilgrastim-cbqv (UDENYCA) injection 6 mg, 6 mg, Subcutaneous, Once, 6 of 6 cycles. Administration: 6 mg (12/19/2018)  cyclophosphamide (CYTOXAN) 1,260 mg in sodium chloride 0.9 % 250 mL chemo infusion, 600 mg/m2 = 1,260 mg, Intravenous,  Once, 8 of 8 cycle. Administration: 1,260 mg (11/05/2018), 1,260 mg (11/26/2018), 1,260 mg (12/17/2018), 1,260 mg (01/07/2019), 1,260 mg (01/28/2019), 1,260 mg (02/18/2019), 1,260 mg (03/11/2019), 1,260 mg (04/02/2019)  fluorouracil (ADRUCIL) chemo injection 1,250  mg, 600 mg/m2 = 1,250 mg, Intravenous,  Once, 8 of 8 cycles. Administration: 1,250 mg (11/05/2018), 1,250 mg (11/26/2018), 1,250 mg (12/17/2018), 1,250 mg (01/07/2019), 1,250 mg (01/28/2019), 1,250 mg (02/18/2019), 1,250 mg (03/11/2019), 1,250 mg  (04/02/2019).    12/31/2018 - 02/19/2019 Radiation Therapy   The patient initially received a dose of 50.4 Gy in 28 fractions to the chest wall and supraclavicular region. This was delivered using a 3-D conformal, 4 field technique. The patient then received a boost to the mastectomy scar. This delivered an additional 10 Gy in 5 fractions using an en face electron field. The total dose was 60.4 Gy.   09/12/2022 Imaging   Bone scan on 09/12/2022 shows uptake in ribs, manubrium. Widespread osseous metastasis confirmed on PET scan that was completed on October 06, 2022. Right iliac crest biopsy demonstrated metastatic carcinoma consistent with patient's known breast carcinoma. ER 90% positive, PR 90% positive, Ki67 10%, HER2 negative.    10/10/2022 Treatment Plan Change   Faslodex beginning 10/10/2022; Ibrance beginning 12/20/2022; Zometa every 12 weeks 11/07/2022    10/23/2022 - 11/03/2022 Radiation Therapy   Palliative Radiation 10/23/2022-11/03/2022:  left chest wall, lower T spine, and right proximal hip/pelvis were each treated to 30 GY in 10 fractions.      CURRENT THERAPY: Faslodex, Mora Appl  INTERVAL HISTORY:  Doris Smith 72 y.o. female returns for follow-up and evaluation with her husband and sister.  The patient, with a history of breast cancer, presents with persistent nausea and decreased appetite. She reports that the nausea is so severe that it prevents her from eating regular meals. She tries to eat as little as possible to avoid feeling nauseous. She also reports a constant bad taste in her mouth, which further discourages her from eating. The patient's husband confirms that her nausea was worse when she was taking Ibrance, a medication for breast cancer. The patient stopped taking Ibrance due to a suspected allergic reaction causing a rash on her legs. However, the patient's nausea persists even after discontinuing Ibrance. The patient is currently receiving Faslodex injections for her  breast cancer. She also reports constipation, which she attributes to her nausea medication and pain medication. The patient expresses a desire to improve her condition and is willing to retry Ibrance to determine if it was indeed the cause of her allergic reaction.   Patient Active Problem List   Diagnosis Date Noted   Peripheral edema 12/29/2022   Restless leg syndrome 10/03/2021   Anxiety associated with depression 06/10/2020   Port-A-Cath in place 11/12/2018   Malignant neoplasm of upper-outer quadrant of right breast in female, estrogen receptor positive (HCC) 08/08/2018   Right knee pain 03/01/2015   Aortic atherosclerosis (HCC) 12/28/2014   Obesity (BMI 30-39.9) 04/09/2014   Insomnia due to stress 04/09/2014   Atrial fibrillation (HCC) 10/01/2013   GERD (gastroesophageal reflux disease) 10/01/2013   Bipolar disorder (HCC) 09/27/2007   Essential hypertension 09/27/2007   ALLERGIC RHINITIS 09/27/2007   IRRITABLE BOWEL SYNDROME 09/27/2007   Arthropathy 09/27/2007   DEGENERATIVE DISC DISEASE, LUMBOSACRAL SPINE 09/27/2007    is allergic to iohexol, codeine, palonosetron, prednisone, sulfonamide derivatives, celecoxib, sulfa antibiotics, and vitamin b12.  MEDICAL HISTORY: Past Medical History:  Diagnosis Date   A-fib (HCC) 11/2012   PAF   Anxiety    takes Ativan   Asthma 04/07/2011   dx   Bipolar disorder (HCC)    Cancer (HCC)    Right breast   Depression    Early cataracts, bilateral  Fatty liver    Fibromyalgia    GERD (gastroesophageal reflux disease)    Irritable bowel syndrome    Mental disorder    dx bipolar   PONV (postoperative nausea and vomiting)    Sleep apnea 04/2011    does not wear CPAP    SURGICAL HISTORY: Past Surgical History:  Procedure Laterality Date   ABDOMINAL HYSTERECTOMY     COLONOSCOPY     DILATION AND CURETTAGE OF UTERUS  1981   abnormal pap   KNEE ARTHROSCOPY Left 2007   x 2   LUMBAR LAMINECTOMY/DECOMPRESSION MICRODISCECTOMY   05/31/2011   Procedure: LUMBAR LAMINECTOMY/DECOMPRESSION MICRODISCECTOMY;  Surgeon: Javier Docker;  Location: [facility];  Service: Orthopedics;  Laterality: Left;  Decompression Lumbar four to five and  Lumbar five to Sacral one on Left  (X-Ray)   MASTECTOMY W/ SENTINEL NODE BIOPSY Right 09/16/2018   Procedure: RIGHT MASTECTOMY WITH RIGHT AXILLARY SENTINEL LYMPH NODE BIOPSY;  Surgeon: Ovidio Kin, MD;  Location: [facility];  Service: General;  Laterality: Right;   PARTIAL HYSTERECTOMY  1982   PORTACATH PLACEMENT Left 10/31/2018   Procedure: INSERTION PORT-A-CATH WITH ULTRASOUND;  Surgeon: Ovidio Kin, MD;  Location: [facility];  Service: General;  Laterality: Left;   TONSILLECTOMY     as child   TOTAL KNEE ARTHROPLASTY Left 12/18/2005    SOCIAL HISTORY: Social History   Socioeconomic History   Marital status: Married    Spouse name: [name]   Number of children: 2   Years of education: 12   Highest education level: Not on file  Occupational History   Occupation: Disabled  Tobacco Use   Smoking status: Never   Smokeless tobacco: Never  Vaping Use   Vaping status: Never Used  Substance and Sexual Activity   Alcohol use: No   Drug use: No   Sexual activity: Yes    Birth control/protection: Post-menopausal, Surgical  Other Topics Concern   Not on file  Social History Narrative   Tea daily.  Rarely has caffeine    Social Determinants of Health   Financial Resource Strain: Low Risk  (08/18/2022)   Overall Financial Resource Strain (CARDIA)    Difficulty of Paying Living Expenses: Not hard at all  Food Insecurity: Low Risk  (09/25/2022)   [facility]   Hunger Vital Sign    Worried About Running Out of Food in the Last Year: Never true    Ran Out of Food in the Last Year: Never true  Transportation Needs: No Transportation Needs (10/04/2022)   [facility]   PRAPARE - Transportation    Lack of Transportation  (Medical): No    Lack of Transportation (Non-Medical): No  Physical Activity: Inactive (08/18/2022)   Exercise Vital Sign    Days of Exercise per Week: 0 days    Minutes of Exercise per Session: 0 min  Stress: No Stress Concern Present (08/18/2022)   [employer] of Occupational Health - Occupational Stress Questionnaire    Feeling of Stress : Not at all  Social Connections: Moderately Integrated (08/18/2022)   Social Connection and Isolation Panel [NHANES]    Frequency of Communication with Friends and Family: More than three times a week    Frequency of Social Gatherings with Friends and Family: More than three times a week    Attends Religious Services: More than 4 times per year    Active Member of [employer] or Organizations: No    Attends [employer]  or Organization Meetings: Never    Marital Status: Married  Catering manager Violence: Not At Risk (08/18/2022)   Humiliation, Afraid, Rape, and Kick questionnaire    Fear of Current or Ex-Partner: No    Emotionally Abused: No    Physically Abused: No    Sexually Abused: No    FAMILY HISTORY: Family History  Problem Relation Age of Onset   Anesthesia problems Mother    Heart disease Mother        CHF, atrial fib   Heart failure Mother    Anesthesia problems Sister    Colon polyps Father    Heart disease Father        "Fluid around the heart"   Hypertension Sister    Breast cancer Maternal Aunt    Colon cancer Maternal Aunt    Prostate cancer Neg Hx    Ovarian cancer Neg Hx     Review of Systems  Constitutional:  Positive for fatigue. Negative for appetite change, chills, fever and unexpected weight change.  HENT:   Negative for hearing loss, lump/mass and trouble swallowing.   Eyes:  Negative for eye problems and icterus.  Respiratory:  Negative for chest tightness, cough and shortness of breath.   Cardiovascular:  Negative for chest pain, leg swelling and palpitations.  Gastrointestinal:  Negative for abdominal distention,  abdominal pain, constipation, diarrhea, nausea and vomiting.  Endocrine: Negative for hot flashes.  Genitourinary:  Negative for difficulty urinating.   Musculoskeletal:  Negative for arthralgias.  Skin:  Negative for itching and rash.  Neurological:  Negative for dizziness, extremity weakness, headaches and numbness.  Hematological:  Negative for adenopathy. Does not bruise/bleed easily.  Psychiatric/Behavioral:  Negative for depression. The patient is not nervous/anxious.       PHYSICAL EXAMINATION     Vitals:   05/16/23 1550  BP: 102/68  Pulse: 98  Resp: 16  Temp: (!) 97.3 F (36.3 C)  SpO2: 98%    General appearance: Cachectic, lost weight, ongoing tardive dyskinesia movements like lip smacking No other acute distress  LABORATORY DATA:  CBC    Component Value Date/Time   WBC 6.3 04/30/2023 1532   RBC 2.94 (L) 04/30/2023 1532   HGB 8.2 (L) 04/30/2023 1532   HGB 12.0 06/05/2022 1024   HCT 26.4 (L) 04/30/2023 1532   HCT 37.4 06/05/2022 1024   PLT 296 04/30/2023 1532   PLT 251 06/05/2022 1024   MCV 89.8 04/30/2023 1532   MCV 86 06/05/2022 1024   MCH 27.9 04/30/2023 1532   MCHC 31.1 04/30/2023 1532   RDW 16.1 (H) 04/30/2023 1532   RDW 15.5 (H) 06/05/2022 1024   LYMPHSABS 0.3 (L) 04/30/2023 1532   LYMPHSABS 0.9 06/05/2022 1024   MONOABS 0.7 04/30/2023 1532   EOSABS 0.1 04/30/2023 1532   EOSABS 0.1 06/05/2022 1024   BASOSABS 0.0 04/30/2023 1532   BASOSABS 0.1 06/05/2022 1024    CMP     Component Value Date/Time   NA 137 04/30/2023 1532   NA 145 (H) 06/05/2022 1024   K 3.7 04/30/2023 1532   CL 97 (L) 04/30/2023 1532   CO2 33 (H) 04/30/2023 1532   GLUCOSE 122 (H) 04/30/2023 1532   BUN 7 (L) 04/30/2023 1532   BUN 17 06/05/2022 1024   CREATININE 0.52 04/30/2023 1532   CREATININE 1.07 (H) 08/25/2022 0921   CALCIUM 8.9 04/30/2023 1532   PROT 6.3 (L) 04/30/2023 1532   PROT 6.6 06/05/2022 1024   ALBUMIN 2.9 (L) 04/30/2023 1532  ALBUMIN 4.0 06/05/2022  1024   AST 29 04/30/2023 1532   AST 22 08/25/2022 0921   ALT 22 04/30/2023 1532   ALT 26 08/25/2022 0921   ALKPHOS 166 (H) 04/30/2023 1532   BILITOT 0.8 04/30/2023 1532   BILITOT 0.5 08/25/2022 0921   GFRNONAA >60 04/30/2023 1532   GFRNONAA 56 (L) 08/25/2022 0921   GFRAA 81 02/03/2020 1049   GFRAA >60 12/19/2018 1434     ASSESSMENT and THERAPY PLAN:   No problem-specific Assessment & Plan notes found for this encounter.  Pt cancelled appt  All questions were answered. The patient knows to call the clinic with any problems, questions or concerns. We can certainly see the patient much sooner if necessary.  Total encounter time:40 minutes*in face-to-face visit time, chart review, lab review, care coordination, order entry, and documentation of the encounter time.   *Total Encounter Time as defined by the Centers for Medicare and Medicaid Services includes, in addition to the face-to-face time of a patient visit (documented in the note above) non-face-to-face time: obtaining and reviewing outside history, ordering and reviewing medications, tests or procedures, care coordination (communications with other health care professionals or caregivers) and documentation in the medical record.

## 2023-05-16 NOTE — Telephone Encounter (Signed)
This RN called pt to inform her of PET scan date/time/location. Informed pt to arrive at 12:30 and to remain NPO 6 hrs prior. Pt verbalized understanding.

## 2023-05-18 ENCOUNTER — Other Ambulatory Visit: Payer: Self-pay | Admitting: Nurse Practitioner

## 2023-05-18 ENCOUNTER — Other Ambulatory Visit: Payer: Self-pay

## 2023-05-18 ENCOUNTER — Ambulatory Visit: Payer: BC Managed Care – PPO

## 2023-05-18 DIAGNOSIS — Z7901 Long term (current) use of anticoagulants: Secondary | ICD-10-CM | POA: Diagnosis not present

## 2023-05-18 DIAGNOSIS — Z9011 Acquired absence of right breast and nipple: Secondary | ICD-10-CM | POA: Diagnosis not present

## 2023-05-18 DIAGNOSIS — G893 Neoplasm related pain (acute) (chronic): Secondary | ICD-10-CM | POA: Diagnosis not present

## 2023-05-18 DIAGNOSIS — Z79899 Other long term (current) drug therapy: Secondary | ICD-10-CM | POA: Diagnosis not present

## 2023-05-18 DIAGNOSIS — T451X5A Adverse effect of antineoplastic and immunosuppressive drugs, initial encounter: Secondary | ICD-10-CM | POA: Diagnosis not present

## 2023-05-18 DIAGNOSIS — I959 Hypotension, unspecified: Secondary | ICD-10-CM | POA: Diagnosis not present

## 2023-05-18 DIAGNOSIS — C50919 Malignant neoplasm of unspecified site of unspecified female breast: Secondary | ICD-10-CM | POA: Diagnosis not present

## 2023-05-18 DIAGNOSIS — R0602 Shortness of breath: Secondary | ICD-10-CM | POA: Diagnosis not present

## 2023-05-18 DIAGNOSIS — X58XXXA Exposure to other specified factors, initial encounter: Secondary | ICD-10-CM | POA: Diagnosis not present

## 2023-05-18 DIAGNOSIS — R110 Nausea: Secondary | ICD-10-CM

## 2023-05-18 DIAGNOSIS — D6959 Other secondary thrombocytopenia: Secondary | ICD-10-CM | POA: Diagnosis not present

## 2023-05-18 DIAGNOSIS — J90 Pleural effusion, not elsewhere classified: Secondary | ICD-10-CM | POA: Diagnosis not present

## 2023-05-18 DIAGNOSIS — R0789 Other chest pain: Secondary | ICD-10-CM | POA: Diagnosis not present

## 2023-05-18 DIAGNOSIS — G2581 Restless legs syndrome: Secondary | ICD-10-CM

## 2023-05-18 DIAGNOSIS — C50411 Malignant neoplasm of upper-outer quadrant of right female breast: Secondary | ICD-10-CM | POA: Diagnosis not present

## 2023-05-18 DIAGNOSIS — R918 Other nonspecific abnormal finding of lung field: Secondary | ICD-10-CM | POA: Diagnosis not present

## 2023-05-18 DIAGNOSIS — D701 Agranulocytosis secondary to cancer chemotherapy: Secondary | ICD-10-CM | POA: Diagnosis not present

## 2023-05-18 DIAGNOSIS — Z170 Estrogen receptor positive status [ER+]: Secondary | ICD-10-CM | POA: Diagnosis not present

## 2023-05-18 DIAGNOSIS — R079 Chest pain, unspecified: Secondary | ICD-10-CM | POA: Diagnosis not present

## 2023-05-18 DIAGNOSIS — C7951 Secondary malignant neoplasm of bone: Secondary | ICD-10-CM | POA: Diagnosis not present

## 2023-05-18 DIAGNOSIS — Z79891 Long term (current) use of opiate analgesic: Secondary | ICD-10-CM | POA: Diagnosis not present

## 2023-05-18 DIAGNOSIS — D6481 Anemia due to antineoplastic chemotherapy: Secondary | ICD-10-CM | POA: Diagnosis not present

## 2023-05-18 NOTE — Telephone Encounter (Signed)
Pt called for refill of zofran, pt has refills on file for this mediation. RN called pharmacy to start a fill for her, no further needs at this time.

## 2023-05-19 ENCOUNTER — Encounter: Payer: Self-pay | Admitting: Hematology and Oncology

## 2023-05-19 DIAGNOSIS — R079 Chest pain, unspecified: Secondary | ICD-10-CM | POA: Diagnosis not present

## 2023-05-21 ENCOUNTER — Telehealth: Payer: Self-pay

## 2023-05-21 ENCOUNTER — Inpatient Hospital Stay: Payer: BC Managed Care – PPO

## 2023-05-21 NOTE — Telephone Encounter (Signed)
Called to check in on patient since d/c'ed home for pain over the weekend. Pt states that in the hospital they increased her OxyContin to 10mg , when asked how she was taking it prior she reports taking only half at a time. Patient and RN discussed current pain regimen, how to use current pain medications and add in percocet for breakthrough pain and where to place lidocaine patch. Patient and her husband verbalized understanding of education, voiced appreciation for the cancer center team, and will callback with any questions or concerns.

## 2023-05-21 NOTE — Progress Notes (Deleted)
Palliative Medicine [facility]  [phone]) [phone] [phone]) [phone]   Name: Doris Smith Date: 05/21/2023 MRN: [id number]  DOB: [birth year]  Patient Care Team: [name], FNP as PCP - General (Family Medicine) Jodelle Gross, NP as PCP - Cardiology (Nurse Practitioner) Ovidio Kin, MD as Consulting Physician (General Surgery) Dorothy Puffer, MD as Consulting Physician (Radiation Oncology) Lysle Pearl, MD as Consulting Physician (Pulmonary Disease) Beverely Low, MD as Consulting Physician (Orthopedic Surgery) Jene Every, MD as Consulting Physician (Orthopedic Surgery) Oneta Rack, NP as Nurse Practitioner (Adult Health Nurse Practitioner) Pershing Proud, RN as Oncology Nurse Navigator Donnelly Angelica, RN as Oncology Nurse Navigator Glenna Fellows, MD as Consulting Physician (Plastic Surgery) Rollene Rotunda, MD as Consulting Physician (Cardiology) Rachel Moulds, MD as Medical Oncologist (Hematology and Oncology)   I connected with Doris Smith on 05/21/23 at  1:15 PM EST by phone and verified that I am speaking with the correct person using two identifiers.   I discussed the limitations, risks, security and privacy concerns of performing an evaluation and management service by telemedicine and the availability of in-person appointments. I also discussed with the patient that there may be a patient responsible charge related to this service. The patient expressed understanding and agreed to proceed.   Other persons participating in the visit and their role in the encounter: n/a   Patient's location: home  Provider's location: [facility]   Chief Complaint: f/u of symptom management   INTERVAL HISTORY: Doris Smith is a 72 y.o. female with oncologic medical history including estrogen receptor positive breast cancer(07/2018) s/p right mastectomy. PET scan 10/06/22 showing widespread osseous metastasis. Other  pertinent history includes atrial fibrillation, asthma, OSA, GERD, fatty liver, fibromyalgia, osteoporosis, chronic headaches, anxiety, depression, and bipolar disorder. Underwent ORIF of right hip 09/20/2022. Palliative ask to see for symptom and pain management and goals of care.   SOCIAL HISTORY:    Doris Smith reports that she has never smoked. She has never used smokeless tobacco. She reports that she does not drink alcohol and does not use drugs.  ADVANCE DIRECTIVES:  Advanced directives on file, Doris Smith, spouse, was identified as Management consultant should patient be unable to make decision for herself   CODE STATUS: Full code  PAST MEDICAL HISTORY: Past Medical History:  Diagnosis Date   A-fib (HCC) 11/2012   PAF   Anxiety    takes Ativan   Asthma 04/07/2011   dx   Bipolar disorder (HCC)    Cancer (HCC)    Right breast   Depression    Early cataracts, bilateral    Fatty liver    Fibromyalgia    GERD (gastroesophageal reflux disease)    Irritable bowel syndrome    Mental disorder    dx bipolar   PONV (postoperative nausea and vomiting)    Sleep apnea 04/2011    does not wear CPAP    ALLERGIES:  is allergic to iohexol, codeine, palonosetron, prednisone, sulfonamide derivatives, celecoxib, sulfa antibiotics, and vitamin b12.  MEDICATIONS:  Current Outpatient Medications  Medication Sig Dispense Refill   apixaban (ELIQUIS) 5 MG TABS tablet Take 1 tablet (5 mg total) by mouth 2 (two) times daily. 180 tablet 1   clotrimazole (MYCELEX) 10 MG troche Take 1 tablet (10 mg total) by mouth 5 (five) times daily. 50 Troche 0   clotrimazole-betamethasone (LOTRISONE) cream APPLY  CREAM TOPICALLY TWICE DAILY 45 g 0   escitalopram (LEXAPRO) 20 MG tablet Take 1  tablet (20 mg total) by mouth daily. 30 tablet 5   esomeprazole (NEXIUM) 40 MG capsule Take 1 capsule (40 mg total) by mouth daily. 90 capsule 0   fluticasone (FLONASE) 50 MCG/ACT nasal spray Place 2 sprays into both nostrils  daily. 16 g 6   furosemide (LASIX) 20 MG tablet Take 1 tablet (20 mg total) by mouth daily. 90 tablet 1   Incontinence Supply Disposable (CERTAINTY FITTED BRIEFS XL) MISC 1 Package by Does not apply route as needed. 56 each 3   lidocaine (LIDODERM) 5 % Place 2 patches onto the skin daily. Remove & Discard patch within 12 hours or as directed by MD 60 patch 0   loratadine (CLARITIN) 10 MG tablet Take 10 mg by mouth daily.     magic mouthwash (nystatin, diphenhydrAMINE, alum & mag hydroxide) suspension mixture Swish and swallow 5 mLs 4 (four) times daily as needed for mouth pain. 240 mL 1   metoprolol tartrate (LOPRESSOR) 25 MG tablet Take 1 tablet (25 mg total) by mouth 2 (two) times daily. 180 tablet 1   midodrine (PROAMATINE) 2.5 MG tablet Take 1 tablet (2.5 mg total) by mouth 3 (three) times daily with meals. 270 tablet 3   nystatin (MYCOSTATIN) 100000 UNIT/ML suspension Take 5 mLs (500,000 Units total) by mouth 4 (four) times daily. 60 mL 0   ondansetron (ZOFRAN) 8 MG tablet Take 1 tablet (8 mg total) by mouth every 8 (eight) hours as needed for nausea or vomiting. 30 tablet 5   oxyCODONE (OXYCONTIN) 10 mg 12 hr tablet Take 1 tablet (10 mg total) by mouth every 12 (twelve) hours. 60 tablet 0   oxyCODONE-acetaminophen (PERCOCET) 7.5-325 MG tablet Take 1 tablet by mouth every 4 (four) hours as needed for severe pain. 60 tablet 0   palbociclib (IBRANCE) 75 MG tablet Take 1 tablet (75 mg total) by mouth daily. Take for 21 days on, 7 days off, repeat every 28 days. 21 tablet 3   polyethylene glycol (MIRALAX / GLYCOLAX) 17 g packet Take 17 g by mouth daily.     pramipexole (MIRAPEX) 1 MG tablet TAKE 1 TABLET BY MOUTH THREE TIMES DAILY 90 tablet 0   prochlorperazine (COMPAZINE) 10 MG tablet Take 1 tablet (10 mg total) by mouth every 6 (six) hours as needed for nausea or vomiting. 30 tablet 2   triamcinolone (KENALOG) 0.1 % Apply 1 application. topically 2 (two) times daily.     Zoledronic Acid (ZOMETA  IV) Inject into the vein.     No current facility-administered medications for this visit.   Facility-Administered Medications Ordered in Other Visits  Medication Dose Route Frequency Provider Last Rate Last Admin   heparin lock flush 100 unit/mL  500 Units Intravenous Once Magrinat, Valentino Hue, MD       sodium chloride flush (NS) 0.9 % injection 10 mL  10 mL Intravenous PRN Magrinat, Valentino Hue, MD        VITAL SIGNS: There were no vitals taken for this visit. There were no vitals filed for this visit.    Estimated body mass index is 30.28 kg/m as calculated from the following:   Height as of 01/24/23: 5\' 4"  (1.626 m).   Weight as of 05/16/23: 176 lb 6.4 oz (80 kg).   PERFORMANCE STATUS (ECOG) : 2 - Symptomatic, <50% confined to bed  Discussed the use of AI scribe software for clinical note transcription with the patient, who gave verbal consent to proceed.   IMPRESSION:  The patient presents  with a chief complaint of persistent and escalating pain, which she reports is not adequately managed by her current pain medication regimen. Her husband is present and sister is on speaker phone. She describes the pain as severe and states that her current medication, a half-dose of Oxycodone, is no longer effective. The patient has been taking this half-dose every four hours for the past few weeks. Significant tardive dyskinesia symptoms worst than previous visits.   In addition to the pain, the patient reports experiencing frequent dry heaves, which occur even when she is at rest. She has been taking two types of nausea medication, Zofran and Compazine, twice daily to manage this symptom. However, the patient and her caregiver express confusion about the correct dosing schedule for these medications.  Mrs. Mehlberg also reports a significant decrease in her appetite and fluid intake, expressing fear of vomiting after eating. She mentions a change in her taste perception, which has made eating less  appealing. The patient's caregiver confirms that the patient has been eating and drinking less than usual.  The patient's medical history includes a previous diagnosis of cancer, for which she received chemotherapy. She is currently not on any chemotherapy medication. The patient is also on estrogen shots, and there are no reported restrictions on food or drink related to this medication.  Mr. Garton expresses concern about the patient's increased sleepiness and decreased activity level, fearing that the patient may become weaker. The patient agrees, expressing a strong desire to remain active and avoid being bedridden. She becomes emotional stating "I am not ready to die or give up!" Emotional support provided.   [name] is scheduled to see her primary care doctor later in the week, with the hope of addressing her sleep issues and possibly adjusting her anxiety medication. The patient's caregiver also inquires about the possibility of the patient being dehydrated and the potential need for intravenous fluids in the near future if appetite does not increase.   In summary, the patient is experiencing uncontrolled pain, frequent dry heaves, decreased appetite and fluid intake, and increased sleepiness. These symptoms are causing significant distress and impacting the patient's quality of life. The patient and her caregiver are seeking effective management strategies for these symptoms.  We discussed her pain at length. She is tolerating her current regimen. I advised patient to take a whole tablet of her breakthrough medication for severe pain. We reviewed her frequency of medication. Given her reports of severe pain patient has not had a refill of breakthrough medication in almost 2 months. They are in agreement to try regimen as discussed. Family aware to reach out with any further questions or concerns. Husband feels some of her discomfort is also related to patient's lack of proper night's sleep including  mix-up in days and nights.   We will continue to closely monitor and support.     Goals of Care  5/23: Mrs. Monestime is emotional. Her husband becomes emotional as patient shares she knows her health is not the best and her cancer is not curable. She is taking life one day at time. Speaks to when her time comes she will be ready but hopeful it is not now. She knows that no one has a definitive time frame. Expresses her appreciation in all of the care and support. Expresses her goal is to continue to treat the treatable allowing her the ability to continue to thrive while not suffering.   4/30 ;Since the night of 10/30/22, Mrs. Csaszar has continued to  meet goal of lying in her own bed at night beside her husband. She expresses appreciation for the care of everyone at the cancer center and is hopeful for continued improvement.  We discussed Her current illness and what it means in the larger context of her on-going co-morbidities. Natural disease trajectory and expectations were discussed. We discussed the importance of continued conversation with family and their medical providers regarding overall plan of care and treatment options, ensuring decisions are within the context of the patients values and GOCs.  PLAN: Assessment & Plan Chronic Pain Increased pain level, current regimen of Oxycodone not providing adequate relief. Discussed increasing dose of Oxycodone to manage pain more effectively. -Increase Oxycodone to full pill as prescribed for pain control. -Consider increasing dose of long-acting Oxycodone if pain remains uncontrolled after adjusting short-acting dose.  Nausea Persistent despite current regimen of Zofran and Compazine. Discussed optimizing timing and dosing of antiemetics. -Alternate Zofran and Compazine every 8 hours as needed, ensuring a 2-hour gap between each medication. -Consider discussing additional antiemetic options with primary care provider.  Dehydration Patient reports  decreased fluid intake, dry mouth, and decreased urination. Lab results indicate mild dehydration. -Increase fluid intake, consider Gatorade for electrolyte replenishment. -Consider IV fluids if dehydration persists or worsens.  Anemia Hemoglobin slightly low at 8.2, patient has history of requiring transfusion when hemoglobin was 6.9. -Discuss potential need for iron supplementation or other interventions with primary care provider.  General Health Maintenance -Continue current regimen of estrogen shots, no dietary restrictions associated. -Consider protein powder or protein bars to supplement nutrition due to decreased appetite and altered taste. -Consider discussing sleep aid options with primary care provider due to poor sleep quality. -Follow up in 1 week to assess effectiveness of medication adjustments.  Patient expressed understanding and was in agreement with this plan. She also understands that she an call the clinic at any time with any questions, concerns, or complaints.   Any controlled substances utilized were prescribed in the context of palliative care. PDMP has been reviewed.    Visit consisted of counseling and education dealing with the complex and emotionally intense issues of symptom management and palliative care in the setting of serious and potentially life-threatening illness.   Willette Alma, AGPCNP-BC  Palliative Medicine Team/[facility]  *Please note that this is a verbal dictation therefore any spelling or grammatical errors are due to the "[employer]agon [employer] One" system interpretation.

## 2023-05-21 NOTE — Progress Notes (Signed)
Nutrition Follow-up:  Patient with metastatic breast cancer to the bone.  Patient taking fulvestrant. Noted seen in ER at [facility] for chest pain.     Spoke with patient via phone. Reports that her appetite is up and down.  Able to eat chicken and corn for lunch today.  Ate 1 egg with grits and 1/4 piece of bacon for breakfast this am.  Usually drinks 1/4-1/2 of boost plus shake in the morning with medication.  Sometimes drinks the rest later in the day.  Denies nausea.  Reports that bowels are moving with stool softner daily and miralax every other day.  Taste of food is still "off".     Medications: reviewed  Labs: reviewed  Anthropometrics:   Weight 176 lb on 11/6 204 lb 12.8 oz on 8/14  14% weight loss in the last 3 months   NUTRITION DIAGNOSIS: Unintentional weight loss continues    INTERVENTION:  Continue boost plus (360 calories) shake for added nutrition Discussed importance of bowel regimen with increase in pain medication to help prevent constipation Encouraged high calorie, high protein foods    MONITORING, EVALUATION, GOAL: weight trends, intake   NEXT VISIT: Monday, Dec 9 phone call  Mickey Esguerra B. Freida Busman, RD, LDN Registered Dietitian [phone]

## 2023-05-22 ENCOUNTER — Telehealth: Payer: Self-pay

## 2023-05-22 DIAGNOSIS — F312 Bipolar disorder, current episode manic severe with psychotic features: Secondary | ICD-10-CM | POA: Diagnosis not present

## 2023-05-22 DIAGNOSIS — F410 Panic disorder [episodic paroxysmal anxiety] without agoraphobia: Secondary | ICD-10-CM | POA: Diagnosis not present

## 2023-05-22 DIAGNOSIS — F4310 Post-traumatic stress disorder, unspecified: Secondary | ICD-10-CM | POA: Diagnosis not present

## 2023-05-22 NOTE — Telephone Encounter (Signed)
Pt called and states she is having burning, shooting pain to her left breast. She denies chest pain. She also denies erythema or heat to breast. She is concerned and states she has had this happening since 05/17/23. She was offered appt with [facility] and states she could come in 11/13. She is scheduled to see [facility] at 230 PM 05/23/23.

## 2023-05-23 ENCOUNTER — Inpatient Hospital Stay (HOSPITAL_BASED_OUTPATIENT_CLINIC_OR_DEPARTMENT_OTHER): Payer: BC Managed Care – PPO | Admitting: Physician Assistant

## 2023-05-23 ENCOUNTER — Telehealth: Payer: Self-pay | Admitting: *Deleted

## 2023-05-23 ENCOUNTER — Other Ambulatory Visit: Payer: Self-pay

## 2023-05-23 VITALS — BP 111/71 | HR 94 | Temp 97.5°F | Resp 18 | Wt 178.0 lb

## 2023-05-23 DIAGNOSIS — D649 Anemia, unspecified: Secondary | ICD-10-CM

## 2023-05-23 DIAGNOSIS — Z170 Estrogen receptor positive status [ER+]: Secondary | ICD-10-CM

## 2023-05-23 DIAGNOSIS — I10 Essential (primary) hypertension: Secondary | ICD-10-CM | POA: Diagnosis not present

## 2023-05-23 DIAGNOSIS — C7951 Secondary malignant neoplasm of bone: Secondary | ICD-10-CM | POA: Diagnosis not present

## 2023-05-23 DIAGNOSIS — G4733 Obstructive sleep apnea (adult) (pediatric): Secondary | ICD-10-CM | POA: Diagnosis not present

## 2023-05-23 DIAGNOSIS — D61818 Other pancytopenia: Secondary | ICD-10-CM

## 2023-05-23 DIAGNOSIS — K760 Fatty (change of) liver, not elsewhere classified: Secondary | ICD-10-CM | POA: Diagnosis not present

## 2023-05-23 DIAGNOSIS — G893 Neoplasm related pain (acute) (chronic): Secondary | ICD-10-CM | POA: Diagnosis not present

## 2023-05-23 DIAGNOSIS — R110 Nausea: Secondary | ICD-10-CM | POA: Diagnosis not present

## 2023-05-23 DIAGNOSIS — C50411 Malignant neoplasm of upper-outer quadrant of right female breast: Secondary | ICD-10-CM | POA: Diagnosis not present

## 2023-05-23 DIAGNOSIS — F419 Anxiety disorder, unspecified: Secondary | ICD-10-CM | POA: Diagnosis not present

## 2023-05-23 DIAGNOSIS — F319 Bipolar disorder, unspecified: Secondary | ICD-10-CM | POA: Diagnosis not present

## 2023-05-23 DIAGNOSIS — M797 Fibromyalgia: Secondary | ICD-10-CM | POA: Diagnosis not present

## 2023-05-23 DIAGNOSIS — I480 Paroxysmal atrial fibrillation: Secondary | ICD-10-CM | POA: Diagnosis not present

## 2023-05-23 DIAGNOSIS — Z1721 Progesterone receptor positive status: Secondary | ICD-10-CM | POA: Diagnosis not present

## 2023-05-23 DIAGNOSIS — G2581 Restless legs syndrome: Secondary | ICD-10-CM | POA: Diagnosis not present

## 2023-05-23 DIAGNOSIS — K589 Irritable bowel syndrome without diarrhea: Secondary | ICD-10-CM | POA: Diagnosis not present

## 2023-05-23 DIAGNOSIS — E669 Obesity, unspecified: Secondary | ICD-10-CM | POA: Diagnosis not present

## 2023-05-23 LAB — CBC WITH DIFFERENTIAL (CANCER CENTER ONLY)
Abs Immature Granulocytes: 0.02 10*3/uL (ref 0.00–0.07)
Basophils Absolute: 0 10*3/uL (ref 0.0–0.1)
Basophils Relative: 2 %
Eosinophils Absolute: 0 10*3/uL (ref 0.0–0.5)
Eosinophils Relative: 1 %
HCT: 24.8 % — ABNORMAL LOW (ref 36.0–46.0)
Hemoglobin: 7.8 g/dL — ABNORMAL LOW (ref 12.0–15.0)
Immature Granulocytes: 2 %
Lymphocytes Relative: 18 %
Lymphs Abs: 0.2 10*3/uL — ABNORMAL LOW (ref 0.7–4.0)
MCH: 29.3 pg (ref 26.0–34.0)
MCHC: 31.5 g/dL (ref 30.0–36.0)
MCV: 93.2 fL (ref 80.0–100.0)
Monocytes Absolute: 0.1 10*3/uL (ref 0.1–1.0)
Monocytes Relative: 6 %
Neutro Abs: 0.9 10*3/uL — ABNORMAL LOW (ref 1.7–7.7)
Neutrophils Relative %: 71 %
Platelet Count: 29 10*3/uL — ABNORMAL LOW (ref 150–400)
RBC: 2.66 MIL/uL — ABNORMAL LOW (ref 3.87–5.11)
RDW: 21.3 % — ABNORMAL HIGH (ref 11.5–15.5)
Smear Review: NORMAL
WBC Count: 1.3 10*3/uL — ABNORMAL LOW (ref 4.0–10.5)
nRBC: 1.5 % — ABNORMAL HIGH (ref 0.0–0.2)

## 2023-05-23 LAB — SAMPLE TO BLOOD BANK

## 2023-05-23 LAB — PREPARE RBC (CROSSMATCH)

## 2023-05-23 NOTE — Patient Instructions (Addendum)
Oxycodone 10 mg (long acting pain medicine): when you wake up between 7 and 8 am. You can also take it at bedtime 7pm as you have been.    Percocet 7.5 -325 mg: (short acting pain medicine that can be taken every 4 hours)  -If you are still have moderate to severe pain you can take this medicine at 9:30 am.  -If still having moderate to severe pain you can take this again 1:30 pm. - If still having moderate to severe pain you can take this again 5:30 pm. -If still having moderate to severe pain you can take this again 9:30 pm.   If bothersome acid reflux you can try over the counter Maalox or Mylanta.   Your hemoglobin today is: 7.9 Your platelets today are below 30,000.  You need to stop taking Ibrance until we tell you to resume it. Do not throw pills away, please hold on to them. The Ilda Foil has a side effect of lowing your blood counts which explains these low hemoglobin and platelets.  You also need to stop taking your Eliquis because your platelets are low. We will tell you when to restart it.

## 2023-05-23 NOTE — Progress Notes (Signed)
Blood orders entered for infusion appointment scheduled for 11/15 at 0800. Blood bank notified and patient is aware to keep her blue armband on.

## 2023-05-23 NOTE — Addendum Note (Signed)
Addended by: Henriette Combs E on: 05/23/2023 04:37 PM   Modules accepted: Orders

## 2023-05-23 NOTE — Progress Notes (Signed)
Symptom Management Consult Note [facility]    Patient Care Team: [name], FNP as PCP - General (Family Medicine) Jodelle Gross, NP as PCP - Cardiology (Nurse Practitioner) Ovidio Kin, MD as Consulting Physician (General Surgery) Dorothy Puffer, MD as Consulting Physician (Radiation Oncology) Lysle Pearl, MD as Consulting Physician (Pulmonary Disease) Beverely Low, MD as Consulting Physician (Orthopedic Surgery) Jene Every, MD as Consulting Physician (Orthopedic Surgery) Oneta Rack, NP as Nurse Practitioner (Adult Health Nurse Practitioner) Pershing Proud, RN as Oncology Nurse Navigator Donnelly Angelica, RN as Oncology Nurse Navigator Glenna Fellows, MD as Consulting Physician (Plastic Surgery) Rollene Rotunda, MD as Consulting Physician (Cardiology) Rachel Moulds, MD as Medical Oncologist (Hematology and Oncology)    Name / MRN / DOB: Doris Smith  [id number]  10-27-1950   Date of visit: 05/23/2023   Chief Complaint/Reason for visit: chest pain   Current Therapy: Alan Ripper   Last treatment:  Faslodex last on 04/20/23   ASSESSMENT & PLAN: Patient is a 72 y.o. female with oncologic history of malignant neoplasm of upper-outer quadrant of right breast in female, estrogen receptor positive followed by Dr. Al Pimple.  I have viewed most recent oncology note, most recent ED visit, and lab work.    #Malignant neoplasm of upper-outer quadrant of right breast in female, estrogen receptor positive  - Patient scheduled for a PET scan on 05/30/23.  Emphasized importance of PET scan to assess cancer progression and guide further treatment. - Next appointment with oncologist is 06/13/23.   #Pain -Chart review shows patient had ED visit for CP at OSH on 05/18/23 for acute chest pain. CT chest was performed and impression includes: 1. Widespread osseous metastases, progressed from 10/06/2022. 2. Pathologic  age-indeterminate compression fractures of the inferior endplate of T8 and the superior endplate of T12. There is cortical breakthrough of the metastasis through the posterior T11 vertebral body causing mass effect on the left ventral thecal sac. 3. Pathologic fracture through the mid sternum. Multiple subacute and chronic pathologic rib fractures. 4. Small left and trace right pleural effusions. Left lower lobe atelectasis secondary to compression from the large hiatal hernia and pleural effusion. 5. Nodular opacities in the lingula measure 11 mm and 10 mm, respectively. These are new since 10/06/2022 and may be due to atelectasis however metastases are not excluded. Continued attention on follow-up. 6. Large hiatal hernia with the majority of the stomach within the chest. -Discussed results of CT with patient and spouse in detail. -Patient is followed by palliative care for pain management. Per last note patient's current pain regimen is oxycodone 10 mg BID and percocet 7.5 - 325 mg q4 hours for break through pain. -Pain is reproducible on exam and located at known sites of osseus metastasis.  -Lengthy discussion with patient and spouse about when to take medications. Provided patient with daily schedule for pain medications as it is currently not controlled. Patient aware she needs to maximize pain control on current regimen. Recommend starting percocet as early as 9:30 AM so she can have multiple doses while awake if needed instead of BID dose she was doing. -Patient will follow up with palliative care early next week to check in on pain control. Will defer to palliative for medication changes/dose increases if needed.  #Pancytopenia -Secondary to Ibrance. -Labs from ED visit on 05/18/23 concerning with WBC 1.5, hemoglobin 8.2, platelets 43k. -Exam today with petechiae on her upper extremities. Concern for worsening labs so CBC checked. WBC  1.3, hemoglobin 7.8, platelets 29k, ANC 0.9. Patient unable  to have blood transfusion tomorrow because of another scheduled doctor's appointment therefore will set her up for Friday morning.  -Will have patient hold Eliquis with profound thrombocytopenia, will inform cardiologist. Discussed neutropenic and bleeding precautions.   Strict ED precautions discussed should symptoms worsen.  Plan discussed with Dr. Al Pimple who agrees.   Heme/Onc History: Oncology History  Malignant neoplasm of upper-outer quadrant of right breast in female, estrogen receptor positive (HCC)  08/08/2018 Initial Diagnosis   status post right breast biopsy 08/02/2018 for a clinical T3 N0, stage IIA invasive lobular carcinoma, grade 2, estrogen receptor strongly positive, progesterone receptor 1% positive, with no HER-2 amplification and an MIB-1 of 1%.             (a) CT scan of the head and chest, without contrast 08/23/2018 showed nonspecific 0.4 cm left lower lobe pulmonary nodule, no definitive metastatic disease             (b) bone scan 08/23/2018 shows multiple spinal areas of abnormal uptake, but             (c) total spinal MRI 09/03/2018 finds bone scan findings to be secondary to degenerative disease, no evidence of metastatic disease.   08/2018 - 10/2022 Anti-estrogen oral therapy   Anastrozole; discontinued 10/14/2018 in preparation for chemotherapy, resumed October 2020   09/16/2018 Surgery   Right mastectomy [name] [phone]): Invasive Lobular Carcinoma, 10.5 cm, grade 2, negative margins. 1 of 5 lymph nodes positive for carcinoma.   09/16/2018 Miscellaneous   MammaPrint "high risk" suggests a 5-year metastasis free survival of 93% with chemotherapy, with an absolute chemotherapy benefit in the greater than 12% range    09/16/2018 Miscellaneous   Caris testing on mastectomy sample (09/16/2018) showed stable MSI and proficient mismatch repair status, with a low mutational burden; BRCA 1 and 2 were not mutated, PI K3 was not mutated, ER B B2 was not mutated, and AKT 1  was not mutated. 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 Administration: 0.25 mg (11/05/2018), 0.25 mg (11/26/2018), 0.25 mg (12/17/2018), 0.25 mg (01/07/2019), 0.25 mg (01/28/2019), 0.25 mg (02/18/2019), 0.25 mg (03/11/2019), 0.25 mg (04/02/2019)  methotrexate (PF) chemo injection 84 mg, 39.8 mg/m2 = 84.5 mg, Intravenous,  Once, 5 of 5 cycles. Administration: 84 mg (11/05/2018), 84 mg (11/26/2018), 84 mg (12/17/2018), 84 mg (03/11/2019), 84 mg (04/02/2019)  pegfilgrastim-cbqv (UDENYCA) injection 6 mg, 6 mg, Subcutaneous, Once, 6 of 6 cycles. Administration: 6 mg (12/19/2018)  cyclophosphamide (CYTOXAN) 1,260 mg in sodium chloride 0.9 % 250 mL chemo infusion, 600 mg/m2 = 1,260 mg, Intravenous,  Once, 8 of 8 cycle. Administration: 1,260 mg (11/05/2018), 1,260 mg (11/26/2018), 1,260 mg (12/17/2018), 1,260 mg (01/07/2019), 1,260 mg (01/28/2019), 1,260 mg (02/18/2019), 1,260 mg (03/11/2019), 1,260 mg (04/02/2019)  fluorouracil (ADRUCIL) chemo injection 1,250 mg, 600 mg/m2 = 1,250 mg, Intravenous,  Once, 8 of 8 cycles. Administration: 1,250 mg (11/05/2018), 1,250 mg (11/26/2018), 1,250 mg (12/17/2018), 1,250 mg (01/07/2019), 1,250 mg (01/28/2019), 1,250 mg (02/18/2019), 1,250 mg (03/11/2019), 1,250 mg (04/02/2019).    12/31/2018 - 02/19/2019 Radiation Therapy   The patient initially received a dose of 50.4 Gy in 28 fractions to the chest wall and supraclavicular region. This was delivered using a 3-D conformal, 4 field technique. The  patient then received a boost to the mastectomy scar. This delivered an additional 10 Gy in 5 fractions using an en face electron field. The total dose was 60.4 Gy.   09/12/2022 Imaging   Bone scan on 09/12/2022 shows uptake in ribs, manubrium. Widespread osseous metastasis confirmed on PET scan that was completed on October 06, 2022. Right iliac crest biopsy demonstrated metastatic carcinoma consistent with patient's known breast carcinoma. ER 90% positive, PR 90% positive, Ki67 10%, HER2 negative.    10/10/2022 Treatment Plan Change   Faslodex beginning 10/10/2022; Ibrance beginning 12/20/2022; Zometa every 12 weeks 11/07/2022    10/23/2022 - 11/03/2022 Radiation Therapy   Palliative Radiation 10/23/2022-11/03/2022:  left chest wall, lower T spine, and right proximal hip/pelvis were each treated to 30 GY in 10 fractions.        Interval history-: Discussed the use of AI scribe software for clinical note transcription with the patient, who gave verbal consent to proceed.   Angla Zegarelli Jaycox is a 72 y.o. female with oncologic history as above presenting to [facility] today with chief complaint of uncontrolled pain in her chest. She is accompanied by spouse who provides additional history.  Patient reports uncontrollable pain in her chest x 6 days. Pain is deep and in her bones she thinks. Pain is in her left ribs and radiates across her chest. Pain is worse with movement and she denies pain at rest. She rates pain 8/10 in severity. Patient reports she went to the ED for this pain on 05/18/23 and did not get any answers as to what is going on. She reports taking her pain medication daily for at least the last 5 days. She has been taking oxycodone 10 mg at 7/8 am when she wakes up and then at bedtime 7:30 pm. She has been taking her break through medicine Percocet at 11 am and 3 pm. She gets minimal relief from her pain medications. She also uses a lidocaine patch and asper creme to help with pain. She denies any fall or trauma preceding this pain. Patient denies any difficulty breathing. She also notices a rash on her arms. She denies any abnormal bleeding.      ROS  All other systems are reviewed and are negative for acute change except as noted in the HPI.    Allergies  Allergen Reactions   Iohexol     "over 20 years ago" had  reaction "hurting in arm and heart"-Done in [place], [place].Marland Kitchenphysician stopped CT at that time.   Codeine Other (See Comments)    Makes feel like she is wild    Palonosetron Other (See Comments)    headache   Prednisone Other (See Comments)    Makes me very irritable   Sulfonamide Derivatives Other (See Comments)    Upset stomach   Celecoxib Nausea And Vomiting   Sulfa Antibiotics Nausea And Vomiting   Vitamin B12 Rash     Past Medical History:  Diagnosis Date   A-fib (HCC) 11/2012   PAF   Anxiety    takes Ativan   Asthma 04/07/2011   dx   Bipolar disorder (HCC)    Cancer (HCC)    Right breast   Depression    Early cataracts, bilateral    Fatty liver    Fibromyalgia    GERD (gastroesophageal reflux disease)    Irritable bowel syndrome    Mental disorder    dx bipolar   PONV (postoperative nausea and vomiting)    Sleep apnea 04/2011  does not wear CPAP     Past Surgical History:  Procedure Laterality Date   ABDOMINAL HYSTERECTOMY     COLONOSCOPY     DILATION AND CURETTAGE OF UTERUS  1981   abnormal pap   KNEE ARTHROSCOPY Left 2007   x 2   LUMBAR LAMINECTOMY/DECOMPRESSION MICRODISCECTOMY  05/31/2011   Procedure: LUMBAR LAMINECTOMY/DECOMPRESSION MICRODISCECTOMY;  Surgeon: Javier Docker;  Location: [facility];  Service: Orthopedics;  Laterality: Left;  Decompression Lumbar four to five and  Lumbar five to Sacral one on Left  (X-Ray)   MASTECTOMY W/ SENTINEL NODE BIOPSY Right 09/16/2018   Procedure: RIGHT MASTECTOMY WITH RIGHT AXILLARY SENTINEL LYMPH NODE BIOPSY;  Surgeon: Ovidio Kin, MD;  Location: [facility] OR;  Service: General;  Laterality: Right;   PARTIAL HYSTERECTOMY  1982   PORTACATH PLACEMENT Left 10/31/2018   Procedure: INSERTION PORT-A-CATH WITH ULTRASOUND;  Surgeon: Ovidio Kin, MD;  Location: [facility];  Service: General;  Laterality: Left;   TONSILLECTOMY     as child   TOTAL KNEE ARTHROPLASTY Left 12/18/2005    Social History    Socioeconomic History   Marital status: Married    Spouse name: [name]   Number of children: 2   Years of education: 12   Highest education level: Not on file  Occupational History   Occupation: Disabled  Tobacco Use   Smoking status: Never   Smokeless tobacco: Never  Vaping Use   Vaping status: Never Used  Substance and Sexual Activity   Alcohol use: No   Drug use: No   Sexual activity: Yes    Birth control/protection: Post-menopausal, Surgical  Other Topics Concern   Not on file  Social History Narrative   Tea daily.  Rarely has caffeine    Social Determinants of Health   Financial Resource Strain: Low Risk  (08/18/2022)   Overall Financial Resource Strain (CARDIA)    Difficulty of Paying Living Expenses: Not hard at all  Food Insecurity: Low Risk  (09/25/2022)   [facility]   Hunger Vital Sign    Worried About Running Out of Food in the Last Year: Never true    Ran Out of Food in the Last Year: Never true  Transportation Needs: No Transportation Needs (10/04/2022)   [facility]   PRAPARE - Transportation    Lack of Transportation (Medical): No    Lack of Transportation (Non-Medical): No  Physical Activity: Inactive (08/18/2022)   Exercise Vital Sign    Days of Exercise per Week: 0 days    Minutes of Exercise per Session: 0 min  Stress: No Stress Concern Present (08/18/2022)   [employer] of Occupational Health - Occupational Stress Questionnaire    Feeling of Stress : Not at all  Social Connections: Moderately Integrated (08/18/2022)   Social Connection and Isolation Panel [NHANES]    Frequency of Communication with Friends and Family: More than three times a week    Frequency of Social Gatherings with Friends and Family: More than three times a week    Attends Religious Services: More than 4 times per year    Active Member of [employer] or Organizations: No    Attends Banker Meetings: Never     Marital Status: Married  Catering manager Violence: Not At Risk (08/18/2022)   Humiliation, Afraid, Rape, and Kick questionnaire    Fear of Current or Ex-Partner: No    Emotionally Abused: No    Physically Abused: No  Sexually Abused: No    Family History  Problem Relation Age of Onset   Anesthesia problems Mother    Heart disease Mother        CHF, atrial fib   Heart failure Mother    Anesthesia problems Sister    Colon polyps Father    Heart disease Father        "Fluid around the heart"   Hypertension Sister    Breast cancer Maternal Aunt    Colon cancer Maternal Aunt    Prostate cancer Neg Hx    Ovarian cancer Neg Hx      Current Outpatient Medications:    apixaban (ELIQUIS) 5 MG TABS tablet, Take 1 tablet (5 mg total) by mouth 2 (two) times daily., Disp: 180 tablet, Rfl: 1   clotrimazole (MYCELEX) 10 MG troche, Take 1 tablet (10 mg total) by mouth 5 (five) times daily., Disp: 50 Troche, Rfl: 0   clotrimazole-betamethasone (LOTRISONE) cream, APPLY  CREAM TOPICALLY TWICE DAILY, Disp: 45 g, Rfl: 0   escitalopram (LEXAPRO) 20 MG tablet, Take 1 tablet (20 mg total) by mouth daily., Disp: 30 tablet, Rfl: 5   esomeprazole (NEXIUM) 40 MG capsule, Take 1 capsule (40 mg total) by mouth daily., Disp: 90 capsule, Rfl: 0   fluticasone (FLONASE) 50 MCG/ACT nasal spray, Place 2 sprays into both nostrils daily., Disp: 16 g, Rfl: 6   furosemide (LASIX) 20 MG tablet, Take 1 tablet (20 mg total) by mouth daily., Disp: 90 tablet, Rfl: 1   Incontinence Supply Disposable (CERTAINTY FITTED BRIEFS XL) MISC, 1 Package by Does not apply route as needed., Disp: 56 each, Rfl: 3   lidocaine (LIDODERM) 5 %, Place 2 patches onto the skin daily. Remove & Discard patch within 12 hours or as directed by MD, Disp: 60 patch, Rfl: 0   loratadine (CLARITIN) 10 MG tablet, Take 10 mg by mouth daily., Disp: , Rfl:    magic mouthwash (nystatin, diphenhydrAMINE, alum & mag hydroxide) suspension mixture, Swish and  swallow 5 mLs 4 (four) times daily as needed for mouth pain., Disp: 240 mL, Rfl: 1   metoprolol tartrate (LOPRESSOR) 25 MG tablet, Take 1 tablet (25 mg total) by mouth 2 (two) times daily., Disp: 180 tablet, Rfl: 1   midodrine (PROAMATINE) 2.5 MG tablet, Take 1 tablet (2.5 mg total) by mouth 3 (three) times daily with meals., Disp: 270 tablet, Rfl: 3   nystatin (MYCOSTATIN) 100000 UNIT/ML suspension, Take 5 mLs (500,000 Units total) by mouth 4 (four) times daily., Disp: 60 mL, Rfl: 0   ondansetron (ZOFRAN) 8 MG tablet, Take 1 tablet (8 mg total) by mouth every 8 (eight) hours as needed for nausea or vomiting., Disp: 30 tablet, Rfl: 5   oxyCODONE (OXYCONTIN) 10 mg 12 hr tablet, Take 1 tablet (10 mg total) by mouth every 12 (twelve) hours., Disp: 60 tablet, Rfl: 0   oxyCODONE-acetaminophen (PERCOCET) 7.5-325 MG tablet, Take 1 tablet by mouth every 4 (four) hours as needed for severe pain., Disp: 60 tablet, Rfl: 0   palbociclib (IBRANCE) 75 MG tablet, Take 1 tablet (75 mg total) by mouth daily. Take for 21 days on, 7 days off, repeat every 28 days., Disp: 21 tablet, Rfl: 3   polyethylene glycol (MIRALAX / GLYCOLAX) 17 g packet, Take 17 g by mouth daily., Disp: , Rfl:    pramipexole (MIRAPEX) 1 MG tablet, TAKE 1 TABLET BY MOUTH THREE TIMES DAILY, Disp: 90 tablet, Rfl: 0   prochlorperazine (COMPAZINE) 10 MG tablet, Take 1  tablet (10 mg total) by mouth every 6 (six) hours as needed for nausea or vomiting., Disp: 30 tablet, Rfl: 2   triamcinolone (KENALOG) 0.1 %, Apply 1 application. topically 2 (two) times daily., Disp: , Rfl:    Zoledronic Acid (ZOMETA IV), Inject into the vein., Disp: , Rfl:  No current facility-administered medications for this visit.  Facility-Administered Medications Ordered in Other Visits:    heparin lock flush 100 unit/mL, 500 Units, Intravenous, Once, Magrinat, Valentino Hue, MD   sodium chloride flush (NS) 0.9 % injection 10 mL, 10 mL, Intravenous, PRN, Magrinat, Valentino Hue,  MD  PHYSICAL EXAM: ECOG FS:2 - Symptomatic, <50% confined to bed    Vitals:   05/23/23 1443  BP: 111/71  Pulse: 94  Resp: 18  Temp: (!) 97.5 F (36.4 C)  SpO2: 99%  Weight: 178 lb (80.7 kg)   Physical Exam Vitals and nursing note reviewed.  Constitutional:      Appearance: She is not ill-appearing or toxic-appearing.  HENT:     Head: Normocephalic.  Eyes:     Conjunctiva/sclera: Conjunctivae normal.  Cardiovascular:     Rate and Rhythm: Normal rate.     Pulses: Normal pulses.     Heart sounds: Normal heart sounds.  Pulmonary:     Effort: Pulmonary effort is normal.     Breath sounds: Normal breath sounds. No stridor. No wheezing, rhonchi or rales.  Chest:       Comments: S/p right mastectomy  Abdominal:     General: There is no distension.  Musculoskeletal:     Cervical back: Normal range of motion.  Skin:    General: Skin is warm and dry.     Findings: Petechiae (left forearm and chest) present.  Neurological:     Mental Status: She is alert.        LABORATORY DATA: I have reviewed the data as listed    Latest Ref Rng & Units 05/23/2023    3:49 PM 04/30/2023    3:32 PM 02/15/2023   10:50 AM  CBC  WBC 4.0 - 10.5 K/uL 1.3  6.3  5.1   Hemoglobin 12.0 - 15.0 g/dL 7.8  8.2  9.4   Hematocrit 36.0 - 46.0 % 24.8  26.4  29.5   Platelets 150 - 400 K/uL 29  296  245         Latest Ref Rng & Units 04/30/2023    3:32 PM 02/15/2023   10:50 AM 01/24/2023   11:33 AM  CMP  Glucose 70 - 99 mg/dL 782  956  213   BUN 8 - 23 mg/dL 7  8  10    Creatinine 0.44 - 1.00 mg/dL 0.86  5.78  4.69   Sodium 135 - 145 mmol/L 137  140  139   Potassium 3.5 - 5.1 mmol/L 3.7  4.2  4.2   Chloride 98 - 111 mmol/L 97  105  106   CO2 22 - 32 mmol/L 33  30  28   Calcium 8.9 - 10.3 mg/dL 8.9  8.5  8.9   Total Protein 6.5 - 8.1 g/dL 6.3  5.9  5.7   Total Bilirubin 0.3 - 1.2 mg/dL 0.8  0.7  0.9   Alkaline Phos 38 - 126 U/L 166  236  194   AST 15 - 41 U/L 29  32  25   ALT 0 - 44 U/L 22   15  14         RADIOGRAPHIC STUDIES (from last  24 hours if applicable) I have personally reviewed the radiological images as listed and agreed with the findings in the report. No results found.      Visit Diagnosis: 1. Malignant neoplasm of upper-outer quadrant of right breast in female, estrogen receptor positive (HCC)   2. Anemia, unspecified type   3. Other pancytopenia (HCC)   4. Neoplasm related pain      Orders Placed This Encounter  Procedures   CBC with Differential (Cancer Center Only)    Standing Status:   Future    Number of Occurrences:   1    Standing Expiration Date:   05/22/2024   Sample to Blood Bank    Standing Status:   Future    Number of Occurrences:   1    Standing Expiration Date:   05/22/2024   Prepare RBC (crossmatch)    Standing Status:   Standing    Number of Occurrences:   1    Order Specific Question:   # of Units    Answer:   1 unit    Order Specific Question:   Transfusion Indications    Answer:   Hemoglobin 8 gm/dL or less and orthopedic or cardiac surgery or pre-existing cardiac condition    Order Specific Question:   Number of Units to Keep Ahead    Answer:   NO units ahead    Order Specific Question:   Instructions:    Answer:   Transfuse    Order Specific Question:   If emergent release call blood bank    Answer:   Not emergent release   Prepare RBC (crossmatch)    Standing Status:   Standing    Number of Occurrences:   1    Order Specific Question:   # of Units    Answer:   1 unit    Order Specific Question:   Transfusion Indications    Answer:   Hemoglobin 8 gm/dL or less and orthopedic or cardiac surgery or pre-existing cardiac condition    Order Specific Question:   Number of Units to Keep Ahead    Answer:   NO units ahead    Order Specific Question:   Instructions:    Answer:   Transfuse    Order Specific Question:   If emergent release call blood bank    Answer:   Not emergent release    All questions were answered.  The patient knows to call the clinic with any problems, questions or concerns. No barriers to learning was detected.  A total of more than 30 minutes were spent on this encounter with face-to-face time and non-face-to-face time, including preparing to see the patient, ordering tests, counseling the patient and coordination of care as outlined above.    Thank you for allowing me to participate in the care of this patient.    Shanon Ace, PA-C Department of Hematology/Oncology [facility] Phone: [phone]  [phone]    05/23/2023 10:19 PM

## 2023-05-23 NOTE — Telephone Encounter (Signed)
This RN called pt per MD request to verify she is holding the Ibrance and Eliquis due to low plts post lab draw today.  Obtained VM - detailed message left as per above.  This RN also attempted to call the pt's husband with number no longer in service as well as her sister on demographic page- obtaining a VM only .  Pt is scheduled to come in the am for blood transfusion will follow up with pt at that time.

## 2023-05-24 ENCOUNTER — Other Ambulatory Visit: Payer: Self-pay | Admitting: Hematology and Oncology

## 2023-05-24 ENCOUNTER — Telehealth: Payer: Self-pay | Admitting: *Deleted

## 2023-05-24 ENCOUNTER — Ambulatory Visit (INDEPENDENT_AMBULATORY_CARE_PROVIDER_SITE_OTHER): Payer: BC Managed Care – PPO | Admitting: Nurse Practitioner

## 2023-05-24 ENCOUNTER — Encounter: Payer: Self-pay | Admitting: Nurse Practitioner

## 2023-05-24 ENCOUNTER — Telehealth: Payer: BC Managed Care – PPO

## 2023-05-24 VITALS — BP 135/74 | HR 98 | Temp 98.1°F | Resp 20

## 2023-05-24 DIAGNOSIS — F419 Anxiety disorder, unspecified: Secondary | ICD-10-CM

## 2023-05-24 DIAGNOSIS — Z170 Estrogen receptor positive status [ER+]: Secondary | ICD-10-CM | POA: Diagnosis not present

## 2023-05-24 DIAGNOSIS — C50411 Malignant neoplasm of upper-outer quadrant of right female breast: Secondary | ICD-10-CM | POA: Diagnosis not present

## 2023-05-24 MED ORDER — LORAZEPAM 1 MG PO TABS: 1.0000 mg | ORAL_TABLET | Freq: Two times a day (BID) | ORAL | 5 refills | Status: DC | PRN

## 2023-05-24 NOTE — Telephone Encounter (Signed)
This RN spoke with pt per her call wanting to verify dosing of her short acting pain medication.  This RN discussed need presently to take the long acting at the bid dosing - but the short acting at 1 tablet every 4 hours. Discussed need to maintain use of stool softeners for bowel movements.  Doris Smith verbalized understanding - also asked about getting refills- with this RN informing pt to bring her pain medication with her tomorrow at appt for blood so we can see when she needs refills with current dosing.  This RN also verified that pt is holding her Ibrance and Eliquis at this time.

## 2023-05-24 NOTE — Progress Notes (Signed)
Subjective:    Patient ID: Doris Smith, female    DOB: Jan 10, 1951, 72 y.o.   MRN: [id number]   Chief Complaint: Depression and Anxiety   Depression        Associated symptoms include no headaches.  Past medical history includes anxiety.   Anxiety Patient reports no chest pain, dizziness, palpitations or shortness of breath.      Patient was recently told that her cancer has spread and there is nothing nore they can do. She is very anxious. She is currently on lexapro. Her sister says that she took lexapro for 3 days and she became in coherent. Slept a lot for 3 days.    05/24/2023   11:20 AM 05/03/2023   10:56 AM 12/29/2022   11:56 AM 10/12/2022   10:30 AM  GAD 7 : Generalized Anxiety Score  Nervous, Anxious, on Edge 2 3 2 2   Control/stop worrying 2 3 1 1   Worry too much - different things 1 3 2 1   Trouble relaxing 3 3 3 3   Restless 3 3 3 2   Easily annoyed or irritable 2 2 2 1   Afraid - awful might happen 0 2 0 0  Total GAD 7 Score 13 19 13 10   Anxiety Difficulty Extremely difficult Extremely difficult Not difficult at all Somewhat difficult       05/24/2023   11:20 AM 05/03/2023   10:56 AM 12/29/2022   11:55 AM  Depression screen PHQ 2/9  Decreased Interest 0 2 2  Down, Depressed, Hopeless 1 3 2   PHQ - 2 Score 1 5 4   Altered sleeping 3 3 1   Tired, decreased energy 3 3 2   Change in appetite 3 3 1   Feeling bad or failure about yourself  0 0 1  Trouble concentrating 3 3 1   Moving slowly or fidgety/restless 3 3 1   Suicidal thoughts 0 0 0  PHQ-9 Score 16 20 11   Difficult doing work/chores Extremely dIfficult Extremely dIfficult Somewhat difficult    Patient Active Problem List   Diagnosis Date Noted   Peripheral edema 12/29/2022   Restless leg syndrome 10/03/2021   Anxiety associated with depression 06/10/2020   Port-A-Cath in place 11/12/2018   Malignant neoplasm of upper-outer quadrant of right breast in female, estrogen receptor positive (HCC)  08/08/2018   Right knee pain 03/01/2015   Aortic atherosclerosis (HCC) 12/28/2014   Obesity (BMI 30-39.9) 04/09/2014   Insomnia due to stress 04/09/2014   Atrial fibrillation (HCC) 10/01/2013   GERD (gastroesophageal reflux disease) 10/01/2013   Bipolar disorder (HCC) 09/27/2007   Essential hypertension 09/27/2007   ALLERGIC RHINITIS 09/27/2007   IRRITABLE BOWEL SYNDROME 09/27/2007   Arthropathy 09/27/2007   DEGENERATIVE DISC DISEASE, LUMBOSACRAL SPINE 09/27/2007       Review of Systems  Constitutional:  Negative for diaphoresis.  Eyes:  Negative for pain.  Respiratory:  Negative for shortness of breath.   Cardiovascular:  Negative for chest pain, palpitations and leg swelling.  Gastrointestinal:  Negative for abdominal pain.  Endocrine: Negative for polydipsia.  Skin:  Negative for rash.  Neurological:  Negative for dizziness, weakness and headaches.  Hematological:  Does not bruise/bleed easily.  Psychiatric/Behavioral:  Positive for depression.   All other systems reviewed and are negative.      Objective:   Physical Exam Vitals and nursing note reviewed.  Constitutional:      General: She is not in acute distress.    Appearance: Normal appearance. She is well-developed.  Neck:  Vascular: No carotid bruit or JVD.  Cardiovascular:     Rate and Rhythm: Normal rate and regular rhythm.     Heart sounds: Normal heart sounds.  Pulmonary:     Effort: Pulmonary effort is normal. No respiratory distress.     Breath sounds: Normal breath sounds. No wheezing or rales.  Chest:     Chest wall: No tenderness.  Abdominal:     General: Bowel sounds are normal. There is no distension or abdominal bruit.     Palpations: Abdomen is soft. There is no hepatomegaly, splenomegaly, mass or pulsatile mass.     Tenderness: There is no abdominal tenderness.  Musculoskeletal:        General: Normal range of motion.     Cervical back: Normal range of motion and neck supple.   Lymphadenopathy:     Cervical: No cervical adenopathy.  Skin:    General: Skin is warm and dry.  Neurological:     Mental Status: She is alert and oriented to person, place, and time.     Deep Tendon Reflexes: Reflexes are normal and symmetric.  Psychiatric:        Behavior: Behavior normal.        Thought Content: Thought content normal.        Judgment: Judgment normal.     BP 135/74   Pulse 98   Temp 98.1 F (36.7 C) (Temporal)   Resp 20   SpO2 98%   Wt Readings from Last 3 Encounters:  05/23/23 178 lb (80.7 kg)  05/16/23 176 lb 6.4 oz (80 kg)  04/30/23 182 lb (82.6 kg)        Assessment & Plan:   Doris Smith in today with chief complaint of Depression and Anxiety   1. Malignant neoplasm of upper-outer quadrant of right breast in female, estrogen receptor positive (HCC) - LORazepam (ATIVAN) 1 MG tablet; Take 1 tablet (1 mg total) by mouth 2 (two) times daily as needed for anxiety.  Dispense: 60 tablet; Refill: 5  2. Anxiety Stress management - LORazepam (ATIVAN) 1 MG tablet; Take 1 tablet (1 mg total) by mouth 2 (two) times daily as needed for anxiety.  Dispense: 60 tablet; Refill: 5    The above assessment and management plan was discussed with the patient. The patient verbalized understanding of and has agreed to the management plan. Patient is aware to call the clinic if symptoms persist or worsen. Patient is aware when to return to the clinic for a follow-up visit. Patient educated on when it is appropriate to go to the emergency department.   [name] Daphine Deutscher, FNP

## 2023-05-24 NOTE — Patient Instructions (Signed)
Stress, Adult Stress is a normal reaction to life events. Stress is what you feel when life demands more than you are used to, or more than you think you can handle. Some stress can be useful, such as studying for a test or meeting a deadline at work. Stress that occurs too often or for too long can cause problems. Long-lasting stress is called chronic stress. Chronic stress can affect your emotional health and interfere with relationships and normal daily activities. Too much stress can weaken your body's defense system (immune system) and increase your risk for physical illness. If you already have a medical problem, stress can make it worse. What are the causes? All sorts of life events can cause stress. An event that causes stress for one person may not be stressful for someone else. Major life events, whether positive or negative, commonly cause stress. Examples include: Losing a job or starting a new job. Losing a loved one. Moving to a new town or home. Getting married or divorced. Having a baby. Getting injured or sick. Less obvious life events can also cause stress, especially if they occur day after day or in combination with each other. Examples include: Working long hours. Driving in traffic. Caring for children. Being in debt. Being in a difficult relationship. What are the signs or symptoms? Stress can cause emotional and physical symptoms and can lead to unhealthy behaviors. These include the following: Emotional symptoms Anxiety. This is feeling worried, afraid, on edge, overwhelmed, or out of control. Anger, including irritation or impatience. Depression. This is feeling sad, down, helpless, or guilty. Trouble focusing, remembering, or making decisions. Physical symptoms Aches and pains. These may affect your head, neck, back, stomach, or other areas of your body. Tight muscles or a clenched jaw. Low energy. Trouble sleeping. Unhealthy behaviors Eating to feel better  (overeating) or skipping meals. Working too much or putting off tasks. Smoking, drinking alcohol, or using drugs to feel better. How is this diagnosed? A stress disorder is diagnosed through an assessment by your health care provider. A stress disorder may be diagnosed based on: Your symptoms and any stressful life events. Your medical history. Tests to rule out other causes of your symptoms. Depending on your condition, your health care provider may refer you to a specialist for further evaluation. How is this treated?  Stress management techniques are the recommended treatment for stress. Medicine is not typically recommended for treating stress. Techniques to reduce your reaction to stressful life events include: Identifying stress. Monitor yourself for symptoms of stress and notice what causes stress for you. These skills may help you to avoid or prepare for stressful events. Managing time. Set your priorities, keep a calendar of events, and learn to say no. These actions can help you avoid taking on too much. Techniques for dealing with stress include: Rethinking the problem. Try to think realistically about stressful events rather than ignoring them or overreacting. Try to find the positives in a stressful situation rather than focusing on the negatives. Exercise. Physical exercise can release both physical and emotional tension. The key is to find a form of exercise that you enjoy and do it regularly. Relaxation techniques. These relax the body and mind. Find one or more that you enjoy and use the techniques regularly. Examples include: Meditation, deep breathing, or progressive relaxation techniques. Yoga or tai chi. Biofeedback, mindfulness techniques, or journaling. Listening to music, being in nature, or taking part in other hobbies. Practicing a healthy lifestyle. Eat a balanced   diet, drink plenty of water, limit or avoid caffeine, and get plenty of sleep. Having a strong support  network. Spend time with family, friends, or other people you enjoy being around. Express your feelings and talk things over with someone you trust. Counseling or talk therapy with a mental health provider may help if you are having trouble managing stress by yourself. Follow these instructions at home: Lifestyle  Avoid drugs. Do not use any products that contain nicotine or tobacco. These products include cigarettes, chewing tobacco, and vaping devices, such as e-cigarettes. If you need help quitting, ask your health care provider. If you drink alcohol: Limit how much you have to: 0-1 drink a day for women who are not pregnant. 0-2 drinks a day for men. Know how much alcohol is in a drink. In the [place] one drink equals one 12 oz bottle of beer (355 mL), one 5 oz glass of wine (148 mL), or one 1 oz glass of hard liquor (44 mL). Do not use alcohol or drugs to relax. Eat a balanced diet that includes fresh fruits and vegetables, whole grains, lean meats, fish, eggs, beans, and low-fat dairy. Avoid processed foods and foods high in added fat, sugar, and salt. Exercise at least 30 minutes on 5 or more days each week. Get 7-8 hours of sleep each night. General instructions  Practice stress management techniques as told by your health care provider. Drink enough fluid to keep your urine pale yellow. Take over-the-counter and prescription medicines only as told by your health care provider. Keep all follow-up visits. This is important. Contact a health care provider if: Your symptoms get worse. You have new symptoms. You feel overwhelmed by your problems and can no longer manage them by yourself. Get help right away if: You have thoughts of hurting yourself or others. Get help right awayif you feel like you may hurt yourself or others, or have thoughts about taking your own life. Go to your nearest emergency room or: Call 911. Call the National Suicide Prevention Lifeline at 1-800-273-8255 or  988. This is open 24 hours a day. Text the Crisis Text Line at 741741. Summary Stress is a normal reaction to life events. It can cause problems if it happens too often or for too long. Practicing stress management techniques is the best way to treat stress. Counseling or talk therapy with a mental health provider may help if you are having trouble managing stress by yourself. This information is not intended to replace advice given to you by your health care provider. Make sure you discuss any questions you have with your health care provider. Document Revised: 02/03/2021 Document Reviewed: 02/03/2021 Elsevier Patient Education  2024 [employer]  

## 2023-05-25 ENCOUNTER — Inpatient Hospital Stay: Payer: BC Managed Care – PPO

## 2023-05-25 ENCOUNTER — Other Ambulatory Visit: Payer: Self-pay | Admitting: Nurse Practitioner

## 2023-05-25 DIAGNOSIS — M797 Fibromyalgia: Secondary | ICD-10-CM | POA: Diagnosis not present

## 2023-05-25 DIAGNOSIS — F319 Bipolar disorder, unspecified: Secondary | ICD-10-CM | POA: Diagnosis not present

## 2023-05-25 DIAGNOSIS — G2581 Restless legs syndrome: Secondary | ICD-10-CM | POA: Diagnosis not present

## 2023-05-25 DIAGNOSIS — Z1721 Progesterone receptor positive status: Secondary | ICD-10-CM | POA: Diagnosis not present

## 2023-05-25 DIAGNOSIS — C7951 Secondary malignant neoplasm of bone: Secondary | ICD-10-CM | POA: Diagnosis not present

## 2023-05-25 DIAGNOSIS — K589 Irritable bowel syndrome without diarrhea: Secondary | ICD-10-CM | POA: Diagnosis not present

## 2023-05-25 DIAGNOSIS — I480 Paroxysmal atrial fibrillation: Secondary | ICD-10-CM | POA: Diagnosis not present

## 2023-05-25 DIAGNOSIS — G893 Neoplasm related pain (acute) (chronic): Secondary | ICD-10-CM | POA: Diagnosis not present

## 2023-05-25 DIAGNOSIS — K760 Fatty (change of) liver, not elsewhere classified: Secondary | ICD-10-CM | POA: Diagnosis not present

## 2023-05-25 DIAGNOSIS — E669 Obesity, unspecified: Secondary | ICD-10-CM | POA: Diagnosis not present

## 2023-05-25 DIAGNOSIS — Z170 Estrogen receptor positive status [ER+]: Secondary | ICD-10-CM | POA: Diagnosis not present

## 2023-05-25 DIAGNOSIS — C50411 Malignant neoplasm of upper-outer quadrant of right female breast: Secondary | ICD-10-CM | POA: Diagnosis not present

## 2023-05-25 DIAGNOSIS — G4733 Obstructive sleep apnea (adult) (pediatric): Secondary | ICD-10-CM | POA: Diagnosis not present

## 2023-05-25 DIAGNOSIS — I10 Essential (primary) hypertension: Secondary | ICD-10-CM | POA: Diagnosis not present

## 2023-05-25 DIAGNOSIS — R110 Nausea: Secondary | ICD-10-CM | POA: Diagnosis not present

## 2023-05-25 DIAGNOSIS — D649 Anemia, unspecified: Secondary | ICD-10-CM

## 2023-05-25 DIAGNOSIS — F419 Anxiety disorder, unspecified: Secondary | ICD-10-CM | POA: Diagnosis not present

## 2023-05-25 MED ORDER — ACETAMINOPHEN 325 MG PO TABS
650.0000 mg | ORAL_TABLET | Freq: Once | ORAL | Status: AC
  Administered 2023-05-25: 650 mg via ORAL
  Filled 2023-05-25: qty 2

## 2023-05-25 MED ORDER — SODIUM CHLORIDE 0.9% IV SOLUTION
250.0000 mL | INTRAVENOUS | Status: DC
  Administered 2023-05-25: 100 mL via INTRAVENOUS

## 2023-05-25 NOTE — Patient Instructions (Signed)
Blood Transfusion, Adult A blood transfusion is a procedure in which you receive blood or a type of blood cell (blood component) through an IV. You may need a blood transfusion when you have a low blood count, which is a low number of any blood cell. This may result from a bleeding disorder, illness, injury, or surgery. The blood may come from a donor, or you may be able to have your own blood collected and stored (autologous blood donation) before a planned surgery. The blood given in a transfusion may be made up of different blood components. You may receive: Red blood cells. These carry oxygen to the cells in the body. Platelets. These help your blood to clot. Plasma. This is the liquid part of your blood. It carries proteins and other substances throughout the body. White blood cells. These help you fight infections. If you have hemophilia or another clotting disorder, you may also receive other types of blood products. Depending on the type of blood product, this procedure may take 1-4 hours to complete. Tell a health care provider about: Any bleeding problems you have. Any previous reactions you have had during a blood transfusion. Any allergies you have. All medicines you are taking, including vitamins, herbs, eye drops, creams, and over-the-counter medicines. Any surgeries you have had. Any medical conditions you have. Whether you are pregnant or may be pregnant. What are the risks? Talk with your health care provider about risks. The most common problems include: A mild allergic reaction, such as red, swollen areas of skin (hives) and itching. Fever or chills. This may be the body's response to new blood cells received. This may occur during or up to 4 hours after the transfusion. More serious problems may include: A serious allergic reaction that causes difficulty breathing or swelling around the face and lips. Transfusion-associated circulatory overload (TACO), or too much fluid in  the lungs. This may cause breathing problems. Transfusion-related acute lung injury (TRALI), which causes breathing difficulty and low oxygen in the blood. This can occur within hours of the transfusion or several days later. Iron overload. This can happen after receiving many blood transfusions over a period of time. Infection or virus being transmitted. This is rare because donated blood is carefully tested before it is given. Hemolytic transfusion reaction. This is rare. It happens when the body's defense system (immune system)tries to attack the new blood cells. Symptoms may include fever, chills, nausea, low blood pressure, and low back or chest pain. Transfusion-associated graft-versus-host disease (TAGVHD). This is rare. It happens when donated cells attack the body's healthy tissues. What happens before the procedure? You will have a blood test to check your blood type. This test is done to know what kind of blood your body will accept and to match it to the donor blood. If you are going to have a planned surgery, you may be able to do an autologous blood donation. This may be done in case you need to have a transfusion. You will have your temperature, blood pressure, and pulse checked before the transfusion. If you have had an allergic reaction to a transfusion in the past, you may be given medicine to help prevent a reaction. This medicine may be given to you by mouth (orally) or through an IV. What happens during the procedure?  An IV will be inserted into one of your veins. The bag of blood will be attached to your IV. The blood will then enter through your vein. Your temperature, blood pressure,   and pulse will be monitored during the transfusion. This monitoring is done to detect early signs of a transfusion reaction. Tell your nurse right away if you have any of these symptoms during the transfusion: Shortness of breath or trouble breathing. Chest or back pain. Fever or  chills. Itching or hives. If you have any signs or symptoms of a reaction, your transfusion will be stopped and you may be given medicine. When the transfusion is complete, your IV will be removed. Pressure may be applied to the IV site for a few minutes. A bandage (dressing)will be applied. The procedure may vary among health care providers and hospitals. What happens after the procedure? Your temperature, blood pressure, pulse, breathing rate, and blood oxygen level will be monitored until you leave the hospital or clinic. Your blood may be tested to see how you have responded to the transfusion. You may be warmed with fluids or blankets to maintain a normal body temperature. If you receive your blood transfusion in an outpatient setting, you will be told whom to contact to report any reactions. Where to find more information Visit the [employer]: redcross.org Summary A blood transfusion is a procedure in which you receive blood or a type of blood cell (blood component) through an IV. The blood given in a transfusion may be made up of different blood components. You may receive red blood cells, platelets, plasma, or white blood cells depending on the condition treated. Your temperature, blood pressure, and pulse will be monitored before, during, and after the transfusion. After the transfusion, your blood may be tested to see how your body has responded. This information is not intended to replace advice given to you by your health care provider. Make sure you discuss any questions you have with your health care provider. Document Revised: 09/23/2021 Document Reviewed: 09/23/2021 Elsevier Patient Education  2024 [employer]  

## 2023-05-26 LAB — TYPE AND SCREEN
ABO/RH(D): O POS
Antibody Screen: NEGATIVE
Unit division: 0

## 2023-05-26 LAB — BPAM RBC
Blood Product Expiration Date: 202412022359
ISSUE DATE / TIME: 202411150842
Unit Type and Rh: 5100

## 2023-05-28 ENCOUNTER — Inpatient Hospital Stay: Payer: BC Managed Care – PPO

## 2023-05-28 ENCOUNTER — Other Ambulatory Visit: Payer: Self-pay | Admitting: Nurse Practitioner

## 2023-05-28 ENCOUNTER — Other Ambulatory Visit (HOSPITAL_COMMUNITY): Payer: Self-pay

## 2023-05-28 ENCOUNTER — Ambulatory Visit (HOSPITAL_COMMUNITY): Payer: BC Managed Care – PPO

## 2023-05-28 ENCOUNTER — Other Ambulatory Visit: Payer: Self-pay

## 2023-05-28 ENCOUNTER — Other Ambulatory Visit (HOSPITAL_BASED_OUTPATIENT_CLINIC_OR_DEPARTMENT_OTHER): Payer: Self-pay

## 2023-05-28 DIAGNOSIS — Z515 Encounter for palliative care: Secondary | ICD-10-CM

## 2023-05-28 DIAGNOSIS — C50411 Malignant neoplasm of upper-outer quadrant of right female breast: Secondary | ICD-10-CM

## 2023-05-28 DIAGNOSIS — G893 Neoplasm related pain (acute) (chronic): Secondary | ICD-10-CM

## 2023-05-28 MED ORDER — OXYCODONE HCL ER 10 MG PO T12A
10.0000 mg | EXTENDED_RELEASE_TABLET | Freq: Two times a day (BID) | ORAL | 0 refills | Status: DC
  Filled 2023-05-28: qty 60, 30d supply, fill #0

## 2023-05-28 MED ORDER — OXYCODONE-ACETAMINOPHEN 7.5-325 MG PO TABS
1.0000 | ORAL_TABLET | ORAL | 0 refills | Status: DC | PRN
  Filled 2023-05-28: qty 60, 10d supply, fill #0

## 2023-05-28 NOTE — Telephone Encounter (Signed)
Pt called for medication refill, see associated orders. Pt preferred pharmacy out of stock, sent to secondary pharmacy.

## 2023-05-28 NOTE — Progress Notes (Unsigned)
Palliative Medicine [facility]  [phone] [phone]   Name: Doris Smith Date: 05/28/2023 MRN: [id number]  DOB: Jun 18, 1951  Patient Care Team: [name], FNP as PCP - General (Family Medicine) Jodelle Gross, NP as PCP - Cardiology (Nurse Practitioner) Ovidio Kin, MD as Consulting Physician (General Surgery) Dorothy Puffer, MD as Consulting Physician (Radiation Oncology) Lysle Pearl, MD as Consulting Physician (Pulmonary Disease) Beverely Low, MD as Consulting Physician (Orthopedic Surgery) Jene Every, MD as Consulting Physician (Orthopedic Surgery) Oneta Rack, NP as Nurse Practitioner (Adult Health Nurse Practitioner) Pershing Proud, RN as Oncology Nurse Navigator Donnelly Angelica, RN as Oncology Nurse Navigator Glenna Fellows, MD as Consulting Physician (Plastic Surgery) Rollene Rotunda, MD as Consulting Physician (Cardiology) Rachel Moulds, MD as Medical Oncologist (Hematology and Oncology)   I connected with Doris Smith on 05/28/23 at 12:00 PM EST by phone and verified that I am speaking with the correct person using two identifiers.   I discussed the limitations, risks, security and privacy concerns of performing an evaluation and management service by telemedicine and the availability of in-person appointments. I also discussed with the patient that there may be a patient responsible charge related to this service. The patient expressed understanding and agreed to proceed.   Other persons participating in the visit and their role in the encounter: n/a   Patient's location: home  Provider's location: [facility]   Chief Complaint: f/u of symptom management   INTERVAL HISTORY: Doris Smith is a 72 y.o. female with oncologic medical history including estrogen receptor positive breast cancer(07/2018) s/p right mastectomy. PET scan 10/06/22 showing widespread osseous metastasis. Other  pertinent history includes atrial fibrillation, asthma, OSA, GERD, fatty liver, fibromyalgia, osteoporosis, chronic headaches, anxiety, depression, and bipolar disorder. Underwent ORIF of right hip 09/20/2022. Palliative ask to see for symptom and pain management and goals of care.   SOCIAL HISTORY:    Doris Smith reports that she has never smoked. She has never used smokeless tobacco. She reports that she does not drink alcohol and does not use drugs.  ADVANCE DIRECTIVES:  Advanced directives on file, Livia Snellen Troost, spouse, was identified as Management consultant should patient be unable to make decision for herself   CODE STATUS: Full code  PAST MEDICAL HISTORY: Past Medical History:  Diagnosis Date   A-fib (HCC) 11/2012   PAF   Anxiety    takes Ativan   Asthma 04/07/2011   dx   Bipolar disorder (HCC)    Cancer (HCC)    Right breast   Depression    Early cataracts, bilateral    Fatty liver    Fibromyalgia    GERD (gastroesophageal reflux disease)    Irritable bowel syndrome    Mental disorder    dx bipolar   PONV (postoperative nausea and vomiting)    Sleep apnea 04/2011    does not wear CPAP    ALLERGIES:  is allergic to iohexol, codeine, palonosetron, prednisone, sulfonamide derivatives, celecoxib, sulfa antibiotics, and vitamin b12.  MEDICATIONS:  Current Outpatient Medications  Medication Sig Dispense Refill   apixaban (ELIQUIS) 5 MG TABS tablet Take 1 tablet (5 mg total) by mouth 2 (two) times daily. 180 tablet 1   clotrimazole (MYCELEX) 10 MG troche Take 1 tablet (10 mg total) by mouth 5 (five) times daily. 50 Troche 0   clotrimazole-betamethasone (LOTRISONE) cream APPLY  CREAM TOPICALLY TWICE DAILY 45 g 0   escitalopram (LEXAPRO) 20 MG tablet Take 1 tablet (  20 mg total) by mouth daily. 30 tablet 5   esomeprazole (NEXIUM) 40 MG capsule Take 1 capsule (40 mg total) by mouth daily. 90 capsule 0   fluticasone (FLONASE) 50 MCG/ACT nasal spray Place 2 sprays into both nostrils  daily. 16 g 6   furosemide (LASIX) 20 MG tablet Take 1 tablet (20 mg total) by mouth daily. 90 tablet 1   Incontinence Supply Disposable (CERTAINTY FITTED BRIEFS XL) MISC 1 Package by Does not apply route as needed. 56 each 3   lidocaine (LIDODERM) 5 % Place 2 patches onto the skin daily. Remove & Discard patch within 12 hours or as directed by MD 60 patch 0   loratadine (CLARITIN) 10 MG tablet Take 10 mg by mouth daily.     LORazepam (ATIVAN) 1 MG tablet Take 1 tablet (1 mg total) by mouth 2 (two) times daily as needed for anxiety. 60 tablet 5   magic mouthwash (nystatin, diphenhydrAMINE, alum & mag hydroxide) suspension mixture Swish and swallow 5 mLs 4 (four) times daily as needed for mouth pain. 240 mL 1   metoprolol tartrate (LOPRESSOR) 25 MG tablet Take 1 tablet (25 mg total) by mouth 2 (two) times daily. 180 tablet 1   midodrine (PROAMATINE) 2.5 MG tablet Take 1 tablet (2.5 mg total) by mouth 3 (three) times daily with meals. 270 tablet 3   nystatin (MYCOSTATIN) 100000 UNIT/ML suspension Take 5 mLs (500,000 Units total) by mouth 4 (four) times daily. 60 mL 0   ondansetron (ZOFRAN) 8 MG tablet Take 1 tablet (8 mg total) by mouth every 8 (eight) hours as needed for nausea or vomiting. 30 tablet 5   oxyCODONE (OXYCONTIN) 10 mg 12 hr tablet Take 1 tablet (10 mg total) by mouth every 12 (twelve) hours. 60 tablet 0   oxyCODONE-acetaminophen (PERCOCET) 7.5-325 MG tablet Take 1 tablet by mouth every 4 (four) hours as needed for severe pain. 60 tablet 0   palbociclib (IBRANCE) 75 MG tablet Take 1 tablet (75 mg total) by mouth daily. Take for 21 days on, 7 days off, repeat every 28 days. 21 tablet 3   polyethylene glycol (MIRALAX / GLYCOLAX) 17 g packet Take 17 g by mouth daily.     pramipexole (MIRAPEX) 1 MG tablet TAKE 1 TABLET BY MOUTH THREE TIMES DAILY 90 tablet 0   prochlorperazine (COMPAZINE) 10 MG tablet Take 1 tablet (10 mg total) by mouth every 6 (six) hours as needed for nausea or vomiting. 30  tablet 2   triamcinolone (KENALOG) 0.1 % Apply 1 application. topically 2 (two) times daily.     Zoledronic Acid (ZOMETA IV) Inject into the vein.     No current facility-administered medications for this visit.   Facility-Administered Medications Ordered in Other Visits  Medication Dose Route Frequency Provider Last Rate Last Admin   heparin lock flush 100 unit/mL  500 Units Intravenous Once Magrinat, Valentino Hue, MD       sodium chloride flush (NS) 0.9 % injection 10 mL  10 mL Intravenous PRN Magrinat, Valentino Hue, MD        VITAL SIGNS: There were no vitals taken for this visit. There were no vitals filed for this visit.    Estimated body mass index is 30.55 kg/m as calculated from the following:   Height as of 01/24/23: 5\' 4"  (1.626 m).   Weight as of 05/23/23: 178 lb (80.7 kg).   PERFORMANCE STATUS (ECOG) : 2 - Symptomatic, <50% confined to bed  Discussed the use of  AI scribe software for clinical note transcription with the patient, who gave verbal consent to proceed.   IMPRESSION:  The patient presents with a chief complaint of persistent and escalating pain, which she reports is not adequately managed by her current pain medication regimen. Her husband is present and sister is on speaker phone. She describes the pain as severe and states that her current medication, a half-dose of Oxycodone, is no longer effective. The patient has been taking this half-dose every four hours for the past few weeks. Significant tardive dyskinesia symptoms worst than previous visits.   In addition to the pain, the patient reports experiencing frequent dry heaves, which occur even when she is at rest. She has been taking two types of nausea medication, Zofran and Compazine, twice daily to manage this symptom. However, the patient and her caregiver express confusion about the correct dosing schedule for these medications.  Mrs. Shultis also reports a significant decrease in her appetite and fluid intake,  expressing fear of vomiting after eating. She mentions a change in her taste perception, which has made eating less appealing. The patient's caregiver confirms that the patient has been eating and drinking less than usual.  The patient's medical history includes a previous diagnosis of cancer, for which she received chemotherapy. She is currently not on any chemotherapy medication. The patient is also on estrogen shots, and there are no reported restrictions on food or drink related to this medication.  Mr. Merkerson expresses concern about the patient's increased sleepiness and decreased activity level, fearing that the patient may become weaker. The patient agrees, expressing a strong desire to remain active and avoid being bedridden. She becomes emotional stating "I am not ready to die or give up!" Emotional support provided.   [name] is scheduled to see her primary care doctor later in the week, with the hope of addressing her sleep issues and possibly adjusting her anxiety medication. The patient's caregiver also inquires about the possibility of the patient being dehydrated and the potential need for intravenous fluids in the near future if appetite does not increase.   In summary, the patient is experiencing uncontrolled pain, frequent dry heaves, decreased appetite and fluid intake, and increased sleepiness. These symptoms are causing significant distress and impacting the patient's quality of life. The patient and her caregiver are seeking effective management strategies for these symptoms.  We discussed her pain at length. She is tolerating her current regimen. I advised patient to take a whole tablet of her breakthrough medication for severe pain. We reviewed her frequency of medication. Given her reports of severe pain patient has not had a refill of breakthrough medication in almost 2 months. They are in agreement to try regimen as discussed. Family aware to reach out with any further questions or  concerns. Husband feels some of her discomfort is also related to patient's lack of proper night's sleep including mix-up in days and nights.   We will continue to closely monitor and support.     Goals of Care  5/23: Mrs. Mcilvain is emotional. Her husband becomes emotional as patient shares she knows her health is not the best and her cancer is not curable. She is taking life one day at time. Speaks to when her time comes she will be ready but hopeful it is not now. She knows that no one has a definitive time frame. Expresses her appreciation in all of the care and support. Expresses her goal is to continue to treat the treatable allowing  her the ability to continue to thrive while not suffering.   4/30 ;Since the night of 10/30/22, Mrs. Husa has continued to meet goal of lying in her own bed at night beside her husband. She expresses appreciation for the care of everyone at the cancer center and is hopeful for continued improvement.  We discussed Her current illness and what it means in the larger context of her on-going co-morbidities. Natural disease trajectory and expectations were discussed. We discussed the importance of continued conversation with family and their medical providers regarding overall plan of care and treatment options, ensuring decisions are within the context of the patients values and GOCs.  PLAN: Assessment & Plan Chronic Pain Increased pain level, current regimen of Oxycodone not providing adequate relief. Discussed increasing dose of Oxycodone to manage pain more effectively. -Increase Oxycodone to full pill as prescribed for pain control. -Consider increasing dose of long-acting Oxycodone if pain remains uncontrolled after adjusting short-acting dose.  Nausea Persistent despite current regimen of Zofran and Compazine. Discussed optimizing timing and dosing of antiemetics. -Alternate Zofran and Compazine every 8 hours as needed, ensuring a 2-hour gap between each  medication. -Consider discussing additional antiemetic options with primary care provider.  Dehydration Patient reports decreased fluid intake, dry mouth, and decreased urination. Lab results indicate mild dehydration. -Increase fluid intake, consider Gatorade for electrolyte replenishment. -Consider IV fluids if dehydration persists or worsens.  Anemia Hemoglobin slightly low at 8.2, patient has history of requiring transfusion when hemoglobin was 6.9. -Discuss potential need for iron supplementation or other interventions with primary care provider.  General Health Maintenance -Continue current regimen of estrogen shots, no dietary restrictions associated. -Consider protein powder or protein bars to supplement nutrition due to decreased appetite and altered taste. -Consider discussing sleep aid options with primary care provider due to poor sleep quality. -Follow up in 1 week to assess effectiveness of medication adjustments.  Patient expressed understanding and was in agreement with this plan. She also understands that she an call the clinic at any time with any questions, concerns, or complaints.   Any controlled substances utilized were prescribed in the context of palliative care. PDMP has been reviewed.    Visit consisted of counseling and education dealing with the complex and emotionally intense issues of symptom management and palliative care in the setting of serious and potentially life-threatening illness.   Willette Alma, AGPCNP-BC  Palliative Medicine Team/[facility]  *Please note that this is a verbal dictation therefore any spelling or grammatical errors are due to the "[employer]" system interpretation.

## 2023-05-29 ENCOUNTER — Encounter: Payer: Self-pay | Admitting: Hematology and Oncology

## 2023-05-29 DIAGNOSIS — F4310 Post-traumatic stress disorder, unspecified: Secondary | ICD-10-CM | POA: Diagnosis not present

## 2023-05-29 DIAGNOSIS — F410 Panic disorder [episodic paroxysmal anxiety] without agoraphobia: Secondary | ICD-10-CM | POA: Diagnosis not present

## 2023-05-29 DIAGNOSIS — F312 Bipolar disorder, current episode manic severe with psychotic features: Secondary | ICD-10-CM | POA: Diagnosis not present

## 2023-05-30 ENCOUNTER — Inpatient Hospital Stay (HOSPITAL_BASED_OUTPATIENT_CLINIC_OR_DEPARTMENT_OTHER): Payer: BC Managed Care – PPO | Admitting: Nurse Practitioner

## 2023-05-30 ENCOUNTER — Encounter (HOSPITAL_COMMUNITY)
Admission: RE | Admit: 2023-05-30 | Discharge: 2023-05-30 | Disposition: A | Discharge status: Home / Self Care | Payer: BC Managed Care – PPO | Source: Ambulatory Visit | Attending: Hematology and Oncology | Admitting: Hematology and Oncology

## 2023-05-30 VITALS — BP 110/51 | HR 92 | Temp 98.2°F | Resp 16 | Wt 175.4 lb

## 2023-05-30 DIAGNOSIS — C50411 Malignant neoplasm of upper-outer quadrant of right female breast: Secondary | ICD-10-CM | POA: Insufficient documentation

## 2023-05-30 DIAGNOSIS — Z515 Encounter for palliative care: Secondary | ICD-10-CM | POA: Diagnosis not present

## 2023-05-30 DIAGNOSIS — C7951 Secondary malignant neoplasm of bone: Secondary | ICD-10-CM | POA: Diagnosis not present

## 2023-05-30 DIAGNOSIS — G893 Neoplasm related pain (acute) (chronic): Secondary | ICD-10-CM

## 2023-05-30 DIAGNOSIS — Z170 Estrogen receptor positive status [ER+]: Secondary | ICD-10-CM

## 2023-05-30 DIAGNOSIS — Z7189 Other specified counseling: Secondary | ICD-10-CM | POA: Diagnosis not present

## 2023-05-30 DIAGNOSIS — R530 Neoplastic (malignant) related fatigue: Secondary | ICD-10-CM

## 2023-05-30 LAB — GLUCOSE, CAPILLARY: Glucose-Capillary: 114 mg/dL — ABNORMAL HIGH (ref 70–99)

## 2023-05-30 MED ORDER — FLUDEOXYGLUCOSE F - 18 (FDG) INJECTION
8.7500 | Freq: Once | INTRAVENOUS | Status: AC
  Administered 2023-05-30: 9.12 via INTRAVENOUS

## 2023-05-31 ENCOUNTER — Encounter: Payer: Self-pay | Admitting: Hematology and Oncology

## 2023-05-31 ENCOUNTER — Encounter: Payer: Self-pay | Admitting: Nurse Practitioner

## 2023-06-01 ENCOUNTER — Telehealth: Payer: Self-pay | Admitting: *Deleted

## 2023-06-01 ENCOUNTER — Other Ambulatory Visit: Payer: Self-pay | Admitting: *Deleted

## 2023-06-01 DIAGNOSIS — Z170 Estrogen receptor positive status [ER+]: Secondary | ICD-10-CM

## 2023-06-01 DIAGNOSIS — C7951 Secondary malignant neoplasm of bone: Secondary | ICD-10-CM

## 2023-06-01 DIAGNOSIS — D61818 Other pancytopenia: Secondary | ICD-10-CM

## 2023-06-01 DIAGNOSIS — D649 Anemia, unspecified: Secondary | ICD-10-CM

## 2023-06-01 NOTE — Telephone Encounter (Signed)
This RN spoke with pt to inform her of PET scan results - note when this RN called pt stated " I am in weepy mood because of this pain "  This RN validated her feelings and condition.  Informed her of good results of the PET that shows the cancer in her bones only. The lungs show some effusions but not as cancer.  This RN informed her per MD review and discussion with rad onc md we want her to have some radiation to areas for improvement of the bone pain.  Doris Smith was reluctant at first saying how bad the side effects were when she had her breast radiated.  This RN informed her radiation to the bone is different then the breast and she should not have the same side effects.  This RN encouraged her with known radiation to the bone giving pain relief and sometimes improved function.  Doris Smith stated she would like that and is willing to proceed back to Rad Onc. She stated appreciation of call and knowing plan helps her feel better.  Plan at present is for pt to come in Monday for lab only- we will call her the following day with the results as well as we are placing a referral to Rad Onc. This RN placed referral to Rad Onc  No further needs at this time.

## 2023-06-04 ENCOUNTER — Encounter: Payer: Self-pay | Admitting: Nurse Practitioner

## 2023-06-04 ENCOUNTER — Telehealth: Payer: Self-pay | Admitting: Hematology and Oncology

## 2023-06-04 ENCOUNTER — Other Ambulatory Visit (HOSPITAL_COMMUNITY): Payer: Self-pay

## 2023-06-04 ENCOUNTER — Inpatient Hospital Stay: Payer: BC Managed Care – PPO | Admitting: Nurse Practitioner

## 2023-06-04 ENCOUNTER — Telehealth: Payer: Self-pay | Admitting: Radiation Oncology

## 2023-06-04 ENCOUNTER — Inpatient Hospital Stay: Payer: BC Managed Care – PPO | Admitting: Hematology and Oncology

## 2023-06-04 ENCOUNTER — Inpatient Hospital Stay: Payer: BC Managed Care – PPO

## 2023-06-04 DIAGNOSIS — Z1721 Progesterone receptor positive status: Secondary | ICD-10-CM | POA: Diagnosis not present

## 2023-06-04 DIAGNOSIS — M898X9 Other specified disorders of bone, unspecified site: Secondary | ICD-10-CM

## 2023-06-04 DIAGNOSIS — Z515 Encounter for palliative care: Secondary | ICD-10-CM

## 2023-06-04 DIAGNOSIS — F419 Anxiety disorder, unspecified: Secondary | ICD-10-CM | POA: Diagnosis not present

## 2023-06-04 DIAGNOSIS — I480 Paroxysmal atrial fibrillation: Secondary | ICD-10-CM | POA: Diagnosis not present

## 2023-06-04 DIAGNOSIS — Z170 Estrogen receptor positive status [ER+]: Secondary | ICD-10-CM | POA: Diagnosis not present

## 2023-06-04 DIAGNOSIS — R110 Nausea: Secondary | ICD-10-CM | POA: Diagnosis not present

## 2023-06-04 DIAGNOSIS — G893 Neoplasm related pain (acute) (chronic): Secondary | ICD-10-CM

## 2023-06-04 DIAGNOSIS — G2581 Restless legs syndrome: Secondary | ICD-10-CM | POA: Diagnosis not present

## 2023-06-04 DIAGNOSIS — C50411 Malignant neoplasm of upper-outer quadrant of right female breast: Secondary | ICD-10-CM | POA: Diagnosis not present

## 2023-06-04 DIAGNOSIS — C7951 Secondary malignant neoplasm of bone: Secondary | ICD-10-CM | POA: Diagnosis not present

## 2023-06-04 DIAGNOSIS — E669 Obesity, unspecified: Secondary | ICD-10-CM | POA: Diagnosis not present

## 2023-06-04 DIAGNOSIS — F319 Bipolar disorder, unspecified: Secondary | ICD-10-CM | POA: Diagnosis not present

## 2023-06-04 DIAGNOSIS — G4733 Obstructive sleep apnea (adult) (pediatric): Secondary | ICD-10-CM | POA: Diagnosis not present

## 2023-06-04 DIAGNOSIS — M797 Fibromyalgia: Secondary | ICD-10-CM | POA: Diagnosis not present

## 2023-06-04 DIAGNOSIS — K589 Irritable bowel syndrome without diarrhea: Secondary | ICD-10-CM | POA: Diagnosis not present

## 2023-06-04 DIAGNOSIS — I10 Essential (primary) hypertension: Secondary | ICD-10-CM | POA: Diagnosis not present

## 2023-06-04 DIAGNOSIS — K760 Fatty (change of) liver, not elsewhere classified: Secondary | ICD-10-CM | POA: Diagnosis not present

## 2023-06-04 LAB — CBC WITH DIFFERENTIAL (CANCER CENTER ONLY)
Abs Immature Granulocytes: 0.02 10*3/uL (ref 0.00–0.07)
Basophils Absolute: 0.1 10*3/uL (ref 0.0–0.1)
Basophils Relative: 3 %
Eosinophils Absolute: 0 10*3/uL (ref 0.0–0.5)
Eosinophils Relative: 2 %
HCT: 27.9 % — ABNORMAL LOW (ref 36.0–46.0)
Hemoglobin: 8.7 g/dL — ABNORMAL LOW (ref 12.0–15.0)
Immature Granulocytes: 1 %
Lymphocytes Relative: 14 %
Lymphs Abs: 0.3 10*3/uL — ABNORMAL LOW (ref 0.7–4.0)
MCH: 30.5 pg (ref 26.0–34.0)
MCHC: 31.2 g/dL (ref 30.0–36.0)
MCV: 97.9 fL (ref 80.0–100.0)
Monocytes Absolute: 0.5 10*3/uL (ref 0.1–1.0)
Monocytes Relative: 23 %
Neutro Abs: 1.2 10*3/uL — ABNORMAL LOW (ref 1.7–7.7)
Neutrophils Relative %: 57 %
Platelet Count: 154 10*3/uL (ref 150–400)
RBC: 2.85 MIL/uL — ABNORMAL LOW (ref 3.87–5.11)
RDW: 25.3 % — ABNORMAL HIGH (ref 11.5–15.5)
WBC Count: 2 10*3/uL — ABNORMAL LOW (ref 4.0–10.5)
nRBC: 2.5 % — ABNORMAL HIGH (ref 0.0–0.2)

## 2023-06-04 LAB — SAMPLE TO BLOOD BANK

## 2023-06-04 LAB — CMP (CANCER CENTER ONLY)
ALT: 16 U/L (ref 0–44)
AST: 32 U/L (ref 15–41)
Albumin: 3.3 g/dL — ABNORMAL LOW (ref 3.5–5.0)
Alkaline Phosphatase: 266 U/L — ABNORMAL HIGH (ref 38–126)
Anion gap: 6 (ref 5–15)
BUN: 11 mg/dL (ref 8–23)
CO2: 29 mmol/L (ref 22–32)
Calcium: 9.1 mg/dL (ref 8.9–10.3)
Chloride: 106 mmol/L (ref 98–111)
Creatinine: 0.54 mg/dL (ref 0.44–1.00)
GFR, Estimated: >60 mL/min (ref >60)
Glucose, Bld: 114 mg/dL — ABNORMAL HIGH (ref 70–99)
Potassium: 3.8 mmol/L (ref 3.5–5.1)
Sodium: 141 mmol/L (ref 135–145)
Total Bilirubin: 0.8 mg/dL (ref <1.2)
Total Protein: 6.1 g/dL — ABNORMAL LOW (ref 6.5–8.1)

## 2023-06-04 MED ORDER — OXYCODONE-ACETAMINOPHEN 10-325 MG PO TABS
1.0000 | ORAL_TABLET | ORAL | 0 refills | Status: DC | PRN
  Filled 2023-06-04: qty 60, 10d supply, fill #0

## 2023-06-04 MED ORDER — MELOXICAM 7.5 MG PO TABS
7.5000 mg | ORAL_TABLET | Freq: Every day | ORAL | 0 refills | Status: DC
  Filled 2023-06-04: qty 30, 30d supply, fill #0

## 2023-06-04 NOTE — Progress Notes (Signed)
Palliative Medicine [facility]  [phone] [phone]   Name: Doris Smith Date: 06/04/2023 MRN: [id number]  DOB: 27-Jan-1951  Patient Care Team: [name], FNP as PCP - General (Family Medicine) Jodelle Gross, NP as PCP - Cardiology (Nurse Practitioner) Ovidio Kin, MD as Consulting Physician (General Surgery) Dorothy Puffer, MD as Consulting Physician (Radiation Oncology) Lysle Pearl, MD as Consulting Physician (Pulmonary Disease) Beverely Low, MD as Consulting Physician (Orthopedic Surgery) Jene Every, MD as Consulting Physician (Orthopedic Surgery) Oneta Rack, NP as Nurse Practitioner (Adult Health Nurse Practitioner) Pershing Proud, RN as Oncology Nurse Navigator Donnelly Angelica, RN as Oncology Nurse Navigator Glenna Fellows, MD as Consulting Physician (Plastic Surgery) Rollene Rotunda, MD as Consulting Physician (Cardiology) Rachel Moulds, MD as Medical Oncologist (Hematology and Oncology)   I connected with Doris Smith on 06/04/23 at 10:00 AM EST by phone and verified that I am speaking with the correct person using two identifiers.   I discussed the limitations, risks, security and privacy concerns of performing an evaluation and management service by telemedicine and the availability of in-person appointments. I also discussed with the patient that there may be a patient responsible charge related to this service. The patient expressed understanding and agreed to proceed.   Other persons participating in the visit and their role in the encounter: N/A   Patient's location: Home  Provider's location: [facility]    Chief Complaint: Pain    INTERVAL HISTORY: Doris Smith is a 72 y.o. female with oncologic medical history including estrogen receptor positive breast cancer(07/2018) s/p right mastectomy. PET scan 10/06/22 showing widespread osseous metastasis. Other pertinent history  includes atrial fibrillation, asthma, OSA, GERD, fatty liver, fibromyalgia, osteoporosis, chronic headaches, anxiety, depression, and bipolar disorder. Underwent ORIF of right hip 09/20/2022. Palliative ask to see for symptom and pain management and goals of care.   SOCIAL HISTORY:    [name] reports that she has never smoked. She has never used smokeless tobacco. She reports that she does not drink alcohol and does not use drugs.  ADVANCE DIRECTIVES:  Advanced directives on file, Doris Smith, spouse, was identified as Management consultant should patient be unable to make decision for herself   CODE STATUS: Full code  PAST MEDICAL HISTORY: Past Medical History:  Diagnosis Date   A-fib (HCC) 11/2012   PAF   Anxiety    takes Ativan   Asthma 04/07/2011   dx   Bipolar disorder (HCC)    Cancer (HCC)    Right breast   Depression    Early cataracts, bilateral    Fatty liver    Fibromyalgia    GERD (gastroesophageal reflux disease)    Irritable bowel syndrome    Mental disorder    dx bipolar   PONV (postoperative nausea and vomiting)    Sleep apnea 04/2011    does not wear CPAP    ALLERGIES:  is allergic to iohexol, codeine, palonosetron, prednisone, sulfonamide derivatives, celecoxib, sulfa antibiotics, and vitamin b12.  MEDICATIONS:  Current Outpatient Medications  Medication Sig Dispense Refill   Lurasidone HCl 60 MG TABS Take 1 tablet by mouth daily.     oxyCODONE-acetaminophen (PERCOCET) 10-325 MG tablet Take 1 tablet by mouth every 4 (four) hours as needed for pain. 60 tablet 0   apixaban (ELIQUIS) 5 MG TABS tablet Take 1 tablet (5 mg total) by mouth 2 (two) times daily. 180 tablet 1   clotrimazole (MYCELEX) 10 MG troche Take 1  tablet (10 mg total) by mouth 5 (five) times daily. 50 Troche 0   clotrimazole-betamethasone (LOTRISONE) cream APPLY  CREAM TOPICALLY TWICE DAILY 45 g 0   esomeprazole (NEXIUM) 40 MG capsule Take 1 capsule (40 mg total) by mouth daily. 90 capsule 0    fluticasone (FLONASE) 50 MCG/ACT nasal spray Place 2 sprays into both nostrils daily. 16 g 6   furosemide (LASIX) 20 MG tablet Take 1 tablet (20 mg total) by mouth daily. 90 tablet 1   Incontinence Supply Disposable (CERTAINTY FITTED BRIEFS XL) MISC 1 Package by Does not apply route as needed. 56 each 3   lidocaine (LIDODERM) 5 % Place 2 patches onto the skin daily. Remove & Discard patch within 12 hours or as directed by MD 60 patch 0   loratadine (CLARITIN) 10 MG tablet Take 10 mg by mouth daily.     LORazepam (ATIVAN) 1 MG tablet Take 1 tablet (1 mg total) by mouth 2 (two) times daily as needed for anxiety. 60 tablet 5   magic mouthwash (nystatin, diphenhydrAMINE, alum & mag hydroxide) suspension mixture Swish and swallow 5 mLs 4 (four) times daily as needed for mouth pain. 240 mL 1   metoprolol tartrate (LOPRESSOR) 25 MG tablet Take 1 tablet (25 mg total) by mouth 2 (two) times daily. 180 tablet 1   midodrine (PROAMATINE) 2.5 MG tablet Take 1 tablet (2.5 mg total) by mouth 3 (three) times daily with meals. 270 tablet 3   nystatin (MYCOSTATIN) 100000 UNIT/ML suspension Take 5 mLs (500,000 Units total) by mouth 4 (four) times daily. 60 mL 0   ondansetron (ZOFRAN) 8 MG tablet Take 1 tablet (8 mg total) by mouth every 8 (eight) hours as needed for nausea or vomiting. 30 tablet 5   oxyCODONE (OXYCONTIN) 10 mg 12 hr tablet Take 1 tablet (10 mg total) by mouth every 12 (twelve) hours. 60 tablet 0   palbociclib (IBRANCE) 75 MG tablet Take 1 tablet (75 mg total) by mouth daily. Take for 21 days on, 7 days off, repeat every 28 days. 21 tablet 3   polyethylene glycol (MIRALAX / GLYCOLAX) 17 g packet Take 17 g by mouth daily.     pramipexole (MIRAPEX) 1 MG tablet TAKE 1 TABLET BY MOUTH THREE TIMES DAILY 90 tablet 0   prochlorperazine (COMPAZINE) 10 MG tablet Take 1 tablet (10 mg total) by mouth every 6 (six) hours as needed for nausea or vomiting. 30 tablet 2   triamcinolone (KENALOG) 0.1 % Apply 1  application. topically 2 (two) times daily.     Zoledronic Acid (ZOMETA IV) Inject into the vein.     No current facility-administered medications for this visit.   Facility-Administered Medications Ordered in Other Visits  Medication Dose Route Frequency Provider Last Rate Last Admin   heparin lock flush 100 unit/mL  500 Units Intravenous Once Magrinat, Valentino Hue, MD       sodium chloride flush (NS) 0.9 % injection 10 mL  10 mL Intravenous PRN Magrinat, Valentino Hue, MD        VITAL SIGNS: There were no vitals taken for this visit. There were no vitals filed for this visit.    Estimated body mass index is 30.11 kg/m as calculated from the following:   Height as of 01/24/23: 5\' 4"  (1.626 m).   Weight as of 05/30/23: 175 lb 6.4 oz (79.6 kg).   PERFORMANCE STATUS (ECOG) : 2 - Symptomatic, <50% confined to bed  IMPRESSION:  I connected by phone with Mrs.  Cortinas to follow-up on her pain management. Patient states she is doing "ok" today compared to the weekend. She shares that she had to wake up both Saturday and Sunday night once each night to take something for pain. Denies nausea, vomiting, constipation, or diarrhea. Appetite is fair. Some days better than others. Her husband is at work but is getting off at 2pm to bring her in for labwork at 3pm.   We discussed her pain at length. Mrs. Mcquary shares the bulk of her pain is in her right rib area which radiates to her sternal region. Pain is sharp and throbbing. Comes and goes. Worsens with movement and certain positions. She states "I just don't know where the pain is coming from." "It gets better then it gets worst!" Support provided. I provided extensive education to patient on etiology of her pain and cause. She verbalized understanding.  Patient also expressed her happiness of recent reports regarding her PET scan.  She shares plans of possible start of radiation however the plan is not confirmed.  Patient was able to appropriately discuss her  current pain regimen stating she is currently taking her OxyContin 10 mg every 12 hours with her Percocet 7.5 mg/325 every 4 hours as needed.  We discussed giving increased intensity of pain and not feeling as though it is well-controlled at this time to make additional adjustments.  Patient verbalized agreement.  Education provided on use of fentanyl patch however she politely declines stating she does not wish to pursue at this time out of fear of patch coming off and putting her at jeopardy of more pain due to her not knowing patch has been removed.  I attempted to explain that patch adheres to skin if applied correctly and also ability to reinforce.  She verbalized understanding however wishes to defer at this time requesting to increase her current medications and trial with adjustments first.  She would then reconsider if needed.  Acknowledged her request.  Education provided with recommendations to increase her OxyContin to 15 mg every 12 hours.  Will increase her Percocet up to 10 mg every 4 hours as needed for breakthrough pain.  Given patient is on chronic Eliquis and with pancytopenia unfortunately this limits our options with use of anti-inflammatory medications.  We will continue to closely monitor and adjust medications as needed.  I again thoroughly reviewed with Mrs. Deweese changes to her current medications.  She verbalized understanding and appreciation.  Advised we will also send AVS via MyChart for family to defer to.  Patient denies any concerns with constipation.  Reminded patient to continue with bowel regimen in the setting of opioid use to prevent constipation.  All questions answered and support provided.   Goals of Care  5/23: Mrs. Choma is emotional. Her husband becomes emotional as patient shares she knows her health is not the best and her cancer is not curable. She is taking life one day at time. Speaks to when her time comes she will be ready but hopeful it is not now. She knows  that no one has a definitive time frame. Expresses her appreciation in all of the care and support. Expresses her goal is to continue to treat the treatable allowing her the ability to continue to thrive while not suffering.   4/30 ;Since the night of 10/30/22, Mrs. Swee has continued to meet goal of lying in her own bed at night beside her husband. She expresses appreciation for the care of everyone at the cancer  center and is hopeful for continued improvement.  We discussed Her current illness and what it means in the larger context of her on-going co-morbidities. Natural disease trajectory and expectations were discussed. We discussed the importance of continued conversation with family and their medical providers regarding overall plan of care and treatment options, ensuring decisions are within the context of the patients values and GOCs.  PLAN:  Chronic Cancer Related Pain Ongoing pain with fluctuations in control despite current regimen of Oxycontin and Percocet.  -Consider transitioning to Fentanyl patch for more consistent pain control.  Patient denies at this time and would like to make adjustments to current oral regimen.  Will consider in the future if needed. -Increase Oxycontin to 15 mg every 12 hours.  Patient will take 1.5 tablets of current OxyContin 10 mg on hand to utilize current medication.  Understands we will send in new prescription for higher dose once home dose runs out.  She confirms that she has a pill splitter in the home.   -Increase Percocet 7.5/325mg  up to 10/325 every 4 hours as needed.  New prescription sent into [facility] outpatient pharmacy for pickup after her appointment later today. -Schedule for radiation oncology consult.  Medication Management Difficulty with medication scheduling and adherence, particularly with Oxycontin. -Explore options for more convenient medication management, such as mail delivery or pickup at a closer pharmacy. -Encourage patient  and caregiver to call for medication refills at least 3-4 days in advance to ensure availability.  Follow-up in two weeks with Dr. Al Pimple and consider medication changes based on pain control.  Patient expressed understanding and was in agreement with this plan. She also understands   hat she an call the clinic at any time with any questions, concerns, or complaints.  I provided 45 minutes of non face-to-face telephone visit time during this encounter.  Any controlled substances utilized were prescribed in the context of palliative care. PDMP has been reviewed.    Visit consisted of counseling and education dealing with the complex and emotionally intense issues of symptom management and palliative care in the setting of serious and potentially life-threatening illness.   Willette Alma, AGPCNP-BC  Palliative Medicine Team/[facility]  *Please note that this is a verbal dictation therefore any spelling or grammatical errors are due to the "[employer] One" system interpretation.

## 2023-06-04 NOTE — Telephone Encounter (Signed)
Pt hgb 8.7. No blood transfusion needed at this time. Provider also notified

## 2023-06-04 NOTE — Patient Instructions (Addendum)
Doris Smith thank you for speaking with me over the phone today.  As a reminder I have included follow-up from our discussions today.  Please increase your OxyContin dose to 15 mg every 12 hours.  This will mean you will take 1-1/2 pill every morning and every night.  You are currently taking 1 pill please use your home pill splitter and cut a pill in half to include a half a tablet with your usual morning and evening dose. As discussed your pain increased during the evening hours and at nighttime.  I have sent in a new prescription for your oxycodone (Percocet) with an increased dose to 10 mg to take every 4 hours as needed for your breakthrough pain.  This will continue on your schedule throughout the day as needed for severe pain. Please make sure you are taking daily stool softeners to prevent constipation. We will continue to closely monitor your pain and discuss what can be done to bring you some comfort.   If you change your mind and wish to proceed with the Fentanyl patch again we can consider.  Please give [place] a call at [phone] if your pain does not improve. We will give you a call on Friday to check in.

## 2023-06-04 NOTE — Telephone Encounter (Signed)
11/25 @ 8:53 am Called patient's contact number no answer/voicemail is full, will try again later to get patient schedule for follow up consult.

## 2023-06-05 ENCOUNTER — Ambulatory Visit
Admission: RE | Admit: 2023-06-05 | Discharge: 2023-06-05 | Discharge status: Home / Self Care | Payer: BC Managed Care – PPO | Source: Ambulatory Visit | Attending: Radiation Oncology | Admitting: Radiation Oncology

## 2023-06-05 ENCOUNTER — Encounter: Payer: Self-pay | Admitting: Radiation Oncology

## 2023-06-05 ENCOUNTER — Ambulatory Visit: Payer: BC Managed Care – PPO

## 2023-06-05 VITALS — Ht 64.0 in | Wt 175.0 lb

## 2023-06-05 DIAGNOSIS — C7951 Secondary malignant neoplasm of bone: Secondary | ICD-10-CM | POA: Diagnosis not present

## 2023-06-05 DIAGNOSIS — Z170 Estrogen receptor positive status [ER+]: Secondary | ICD-10-CM

## 2023-06-05 DIAGNOSIS — C50411 Malignant neoplasm of upper-outer quadrant of right female breast: Secondary | ICD-10-CM | POA: Diagnosis not present

## 2023-06-05 LAB — CANCER ANTIGEN 27.29: CA 27.29: 1180.4 U/mL — ABNORMAL HIGH (ref 0.0–38.6)

## 2023-06-05 NOTE — Progress Notes (Addendum)
Nursing interview for Recurrent metastatic Stage IB, [id number], grade 2 ,ER/PR positive invasive lobular carcinoma of the right breast.    Patient identity verified x2.  Patient reports joint pain at multiple sights 7/10, fatigue, and overall weakness. Patient denies any other issues at this time.  Patient states "No breast pain currently."  Meaningful use complete.  Vitals- Ht 5\' 4"  (1.626 m)   Wt 175 lb (79.4 kg)   BMI 30.04 kg/m   This concludes the interaction.  [name], LPN

## 2023-06-05 NOTE — Progress Notes (Signed)
Radiation Oncology         [phone] ________________________________   Outpatient Re Consultation - Conducted via telephone at patient request.  I spoke with the patient to conduct this consult visit via telephone. The patient was notified in advance and was offered an in person or telemedicine meeting to allow for face to face communication but instead preferred to proceed with a telephone consult.   Name: Doris Smith        MRN: [id number]  Date of Service: 06/05/2023 DOB: 1950-09-14  OV:FIEPPI, [name], FNP  Rachel Moulds, MD     REFERRING PHYSICIAN: Rachel Moulds, MD   DIAGNOSIS: The encounter diagnosis was Malignant neoplasm of upper-outer quadrant of right breast in female, estrogen receptor positive (HCC).   HISTORY OF PRESENT ILLNESS: Doris Smith is a 72 y.o. female known to our clinic with a history of recurrent Stage IB, pT3N10miM0, grade 2 ,ER/PR positive invasive lobular carcinoma of the right breast.  She was originally diagnosed in 2020. She underwent right mastectomy with right axillary lymph node biopsy on 09/16/2018.  Final pathology revealed a grade 2, 10.5 cm invasive lobular carcinoma.  Her resection margins were negative, and 1 of the 5 lymph nodes contained micrometastatic disease.  She did have MammaPrint testing that was high risk, and she subsequently began systemic Cytoxan, 5-FU and methotrexate between 11/05/2018 and 04/02/2019. Chemo was held after 3 cycles to complete postmastectomy radiotherapy. She resumed anastrozole following chemotherapy.    She was starting to have some symptoms of musculoskeletal pain  and chest x-rays in November 2023 were negative for any focal osseous findings this was repeated and no evidence of fracture of her ribs or chest wall findings were noted by x-ray in December 2023 again a chest x-ray in January 2024 was negative for any focal findings as well as in March 2024.  A bone scan on 09/12/2022  showed new focal  abnormal tracer uptake involving bilateral ribs, manubrium/sternum, and a focus of abnormal uptake at the medial aspect of the right knee was favored to be degenerative.  A PET scan on 10/06/2022 showed widespread osseous metastatic disease involving the thoracolumbar spine, pelvis, left clavicular head, left scapula, left proximal humerus, sternum, right scapula, bilateral ribs and she had also apparently been treated for nondisplaced femoral neck fracture with ORIF on 09/19/2022 though no pathology was submitted during this procedure.  She was started on Faslodex and [place]. She received palliative radiation to the left chest wall ribs, lower T spine, and right proximal hip/pelvis which she completed in April 2024. Also, a CT-guided bone biopsy on 10/25/2022 confirmed her suspected metastatic disease due to breast cancer and this was ER/PR positive, HER2 negative with a Ki 67 of 10%.   Since her last visit, she continues Faslodex and she had concerns of a reaction from [place], but was going to reconsider taking this again, though she's not restarted this. She was seen on 05/18/23 at an outside hospital due to pain, and a CT chest showed progressive metastatic disease, and a pathologic fracture of the mid sternum, and multiple subacute and chronic pathologic rib fractures, and multiple new nodular opacities in the lungs. She did have a PET scan on 05/30/23 that showed no metabolic changes in the lung nodules seen on CT, and non avid effusions, but widespread disease seen in the axial and appendicular skeleton including her left clavicle, sternum, T8 spine and L5 spine. She's seen via Mychart to discuss options of palliative radiation.  PREVIOUS RADIATION THERAPY:   10/23/22-11/03/22 The left chest wall, lower T spine, and right proximal hip/pelvis were each treated to 30 Gy in 10 fractions.   12/31/2018 through 02/19/2019 The patient initially received a dose of 50.4 Gy in 28 fractions to the  right chest wall and supraclavicular region. This was delivered using a 3-D conformal, 4 field technique. The patient then received a boost to the mastectomy scar. This delivered an additional 10 Gy in 5 fractions using an en face electron field. The total dose was 60.4 Gy.   PAST MEDICAL HISTORY:  Past Medical History:  Diagnosis Date   A-fib (HCC) 11/2012   PAF   Anxiety    takes Ativan   Asthma 04/07/2011   dx   Bipolar disorder (HCC)    Cancer (HCC)    Right breast   Depression    Early cataracts, bilateral    Fatty liver    Fibromyalgia    GERD (gastroesophageal reflux disease)    Irritable bowel syndrome    Mental disorder    dx bipolar   PONV (postoperative nausea and vomiting)    Sleep apnea 04/2011    does not wear CPAP       PAST SURGICAL HISTORY: Past Surgical History:  Procedure Laterality Date   ABDOMINAL HYSTERECTOMY     COLONOSCOPY     DILATION AND CURETTAGE OF UTERUS  1981   abnormal pap   KNEE ARTHROSCOPY Left 2007   x 2   LUMBAR LAMINECTOMY/DECOMPRESSION MICRODISCECTOMY  05/31/2011   Procedure: LUMBAR LAMINECTOMY/DECOMPRESSION MICRODISCECTOMY;  Surgeon: Javier Docker;  Location: [facility];  Service: Orthopedics;  Laterality: Left;  Decompression Lumbar four to five and  Lumbar five to Sacral one on Left  (X-Ray)   MASTECTOMY W/ SENTINEL NODE BIOPSY Right 09/16/2018   Procedure: RIGHT MASTECTOMY WITH RIGHT AXILLARY SENTINEL LYMPH NODE BIOPSY;  Surgeon: Ovidio Kin, MD;  Location: [facility] OR;  Service: General;  Laterality: Right;   PARTIAL HYSTERECTOMY  1982   PORTACATH PLACEMENT Left 10/31/2018   Procedure: INSERTION PORT-A-CATH WITH ULTRASOUND;  Surgeon: Ovidio Kin, MD;  Location: [facility];  Service: General;  Laterality: Left;   TONSILLECTOMY     as child   TOTAL KNEE ARTHROPLASTY Left 12/18/2005     FAMILY HISTORY:  Family History  Problem Relation Age of Onset   Anesthesia problems Mother    Heart disease Mother        CHF,  atrial fib   Heart failure Mother    Anesthesia problems Sister    Colon polyps Father    Heart disease Father        "Fluid around the heart"   Hypertension Sister    Breast cancer Maternal Aunt    Colon cancer Maternal Aunt    Prostate cancer Neg Hx    Ovarian cancer Neg Hx      SOCIAL HISTORY:  reports that she has never smoked. She has never used smokeless tobacco. She reports that she does not drink alcohol and does not use drugs. The patient is married and is currently staying in [place], though her permanent address is [place].     ALLERGIES: Iohexol, Codeine, Palonosetron, Prednisone, Sulfonamide derivatives, Celecoxib, Sulfa antibiotics, and Vitamin b12   MEDICATIONS:  Current Outpatient Medications  Medication Sig Dispense Refill   apixaban (ELIQUIS) 5 MG TABS tablet Take 1 tablet (5 mg total) by mouth 2 (two) times daily. 180 tablet 1   clotrimazole (MYCELEX) 10 MG troche Take  1 tablet (10 mg total) by mouth 5 (five) times daily. 50 Troche 0   clotrimazole-betamethasone (LOTRISONE) cream APPLY  CREAM TOPICALLY TWICE DAILY 45 g 0   esomeprazole (NEXIUM) 40 MG capsule Take 1 capsule (40 mg total) by mouth daily. 90 capsule 0   fluticasone (FLONASE) 50 MCG/ACT nasal spray Place 2 sprays into both nostrils daily. 16 g 6   furosemide (LASIX) 20 MG tablet Take 1 tablet (20 mg total) by mouth daily. 90 tablet 1   Incontinence Supply Disposable (CERTAINTY FITTED BRIEFS XL) MISC 1 Package by Does not apply route as needed. 56 each 3   lidocaine (LIDODERM) 5 % Place 2 patches onto the skin daily. Remove & Discard patch within 12 hours or as directed by MD 60 patch 0   loratadine (CLARITIN) 10 MG tablet Take 10 mg by mouth daily.     LORazepam (ATIVAN) 1 MG tablet Take 1 tablet (1 mg total) by mouth 2 (two) times daily as needed for anxiety. 60 tablet 5   Lurasidone HCl 60 MG TABS Take 1 tablet by mouth daily.     magic mouthwash (nystatin, diphenhydrAMINE, alum & mag hydroxide)  suspension mixture Swish and swallow 5 mLs 4 (four) times daily as needed for mouth pain. 240 mL 1   metoprolol tartrate (LOPRESSOR) 25 MG tablet Take 1 tablet (25 mg total) by mouth 2 (two) times daily. 180 tablet 1   midodrine (PROAMATINE) 2.5 MG tablet Take 1 tablet (2.5 mg total) by mouth 3 (three) times daily with meals. 270 tablet 3   nystatin (MYCOSTATIN) 100000 UNIT/ML suspension Take 5 mLs (500,000 Units total) by mouth 4 (four) times daily. 60 mL 0   ondansetron (ZOFRAN) 8 MG tablet Take 1 tablet (8 mg total) by mouth every 8 (eight) hours as needed for nausea or vomiting. 30 tablet 5   oxyCODONE (OXYCONTIN) 10 mg 12 hr tablet Take 1 tablet (10 mg total) by mouth every 12 (twelve) hours. 60 tablet 0   oxyCODONE-acetaminophen (PERCOCET) 10-325 MG tablet Take 1 tablet by mouth every 4 (four) hours as needed for pain. 60 tablet 0   palbociclib (IBRANCE) 75 MG tablet Take 1 tablet (75 mg total) by mouth daily. Take for 21 days on, 7 days off, repeat every 28 days. 21 tablet 3   polyethylene glycol (MIRALAX / GLYCOLAX) 17 g packet Take 17 g by mouth daily.     pramipexole (MIRAPEX) 1 MG tablet TAKE 1 TABLET BY MOUTH THREE TIMES DAILY 90 tablet 0   prochlorperazine (COMPAZINE) 10 MG tablet Take 1 tablet (10 mg total) by mouth every 6 (six) hours as needed for nausea or vomiting. 30 tablet 2   triamcinolone (KENALOG) 0.1 % Apply 1 application. topically 2 (two) times daily.     Zoledronic Acid (ZOMETA IV) Inject into the vein.     No current facility-administered medications for this encounter.   Facility-Administered Medications Ordered in Other Encounters  Medication Dose Route Frequency Provider Last Rate Last Admin   heparin lock flush 100 unit/mL  500 Units Intravenous Once Magrinat, Valentino Hue, MD       sodium chloride flush (NS) 0.9 % injection 10 mL  10 mL Intravenous PRN Magrinat, Valentino Hue, MD         REVIEW OF SYSTEMS: On review of systems, is having pain mainly in the right chest  wall along the right axilla but this extends at the same level into her posterior back. She describes pain as well in  the front of her chest along her breast plate. Her symptoms intensify some days more than others but she has also noted pain over the last few weeks in her left hip and upper thigh. She has described her symptoms as causing a feeling of weakness/instability to her sister. She has not started the long acting pain medication but plans to do so this weekend since her husband will be home for her to have help if needed. No other complaints are verbalized.      PHYSICAL EXAM:  Unable to assess vitals given encounter type.   ECOG = 1  0 - Asymptomatic (Fully active, able to carry on all predisease activities without restriction)  1 - Symptomatic but completely ambulatory (Restricted in physically strenuous activity but ambulatory and able to carry out work of a light or sedentary nature. For example, light housework, office work)  2 - Symptomatic, <50% in bed during the day (Ambulatory and capable of all self care but unable to carry out any work activities. Up and about more than 50% of waking hours)  3 - Symptomatic, >50% in bed, but not bedbound (Capable of only limited self-care, confined to bed or chair 50% or more of waking hours)  4 - Bedbound (Completely disabled. Cannot carry on any self-care. Totally confined to bed or chair)  5 - Death   Santiago Glad MM, Creech RH, Tormey DC, et al. [phone]). "Toxicity and response criteria of the [facility]". Am. Evlyn Clines. Oncol. 5 (6): 649-55    LABORATORY DATA:  Lab Results  Component Value Date   WBC 2.0 (L) 06/04/2023   HGB 8.7 (L) 06/04/2023   HCT 27.9 (L) 06/04/2023   MCV 97.9 06/04/2023   PLT 154 06/04/2023   Lab Results  Component Value Date   NA 141 06/04/2023   K 3.8 06/04/2023   CL 106 06/04/2023   CO2 29 06/04/2023   Lab Results  Component Value Date   ALT 16 06/04/2023   AST 32 06/04/2023    ALKPHOS 266 (H) 06/04/2023   BILITOT 0.8 06/04/2023      RADIOGRAPHY: NM PET Image Restage (PS) Skull Base to Thigh (F-18 FDG)  Result Date: 05/31/2023 CLINICAL DATA:  Subsequent treatment strategy for metastatic breast cancer. EXAM: NUCLEAR MEDICINE PET SKULL BASE TO THIGH TECHNIQUE: 9.1 mCi F-18 FDG was injected intravenously. Full-ring PET imaging was performed from the skull base to thigh after the radiotracer. CT data was obtained and used for attenuation correction and anatomic localization. Fasting blood glucose: 114 mg/dl COMPARISON:  PET-CT dated 10/06/2022 FINDINGS: Mediastinal blood pool activity: SUV max 2.5 Liver activity: SUV max NA NECK: No hypermetabolic lymph nodes in the neck. Incidental CT findings: None. CHEST: Status post right mastectomy. No hypermetabolic thoracic lymphadenopathy. No suspicious pulmonary nodules. Moderate left and small right pleural effusions, non FDG avid with max SUV 1.7, but new from prior. Incidental CT findings: Mild atherosclerotic calcifications of the aortic arch. ABDOMEN/PELVIS: No abnormal hypermetabolic activity within the liver, pancreas, adrenal glands, or spleen. No hypermetabolic lymph nodes in the abdomen or pelvis. Incidental CT findings: Large hiatal hernia/inverted intrathoracic stomach. Mild to moderate left hydronephrosis with a UPJ configuration, although new from prior. Status post hysterectomy. SKELETON: Multifocal osseous metastases throughout the visualized axial and appendicular skeleton. Index lesions include: --Medial left clavicle, max SUV 5.7, previously 6.2 --Inferior sternum, max SUV 7.2, previously 6.9 --T8 vertebral body, max SUV 5.6, previous 6.8 --L5 vertebral body, max SUV 7.2, previously 8.2 On CT, there  is progressive conspicuity of sclerotic/lytic lesions, particularly involving the thoracic spine and pelvis. Incidental CT findings: Degenerative changes of the visualized thoracolumbar spine. IMPRESSION: Status post right  mastectomy. Multifocal osseous metastases throughout the visualized axial and appendicular skeleton, grossly unchanged on PET, although with increased conspicuity on CT. Moderate left and small right pleural effusions, non FDG avid, but new. Electronically Signed   By: Charline Bills M.D.   On: 05/31/2023 14:36       IMPRESSION/PLAN: 1. Recurrent metastatic Stage IB, pT3N1miM0, grade 2 ,ER/PR positive invasive lobular carcinoma of the right breast. Dr. Mitzi Hansen has reviewed her previous PET scan. He has communicated with Dr. Al Pimple and I and believes we could be of help to Ms. Herrle with a palliative course of radiotherapy. We discussed her symptoms, and it sounds like her most symptomatic areas of concern are her right chest wall posterior to anterior, and middle anterior chest along the sternum, and the left hip/pelvis. We discussed the risks, benefits, short, and long term effects of radiotherapy, as well as the palliative intent, and the patient is interested in proceeding. We reviewedthe delivery and logistics of radiotherapy and anticipates a course of 2 weeks of radiotherapy to the right ribs posteriorly at T2-T3, and sternum, and that I would discuss her case with Dr. Kathrynn Running and ask about the left hip/pelvis. She will simulate tomorrow and sign consent at that time.  2. Pain secondary to #1. She met with palliative care and will make changes recommended to her regimen. We appreciate their input.    This encounter was conducted via telephone.  The patient has provided two factor identification and has given verbal consent for this type of encounter and has been advised to only accept a meeting of this type in a secure network environment. The time spent during this encounter was 45 minutes including preparation, discussion, and coordination of the patient's care. The attendants for this meeting include  Ronny Bacon  and Albertine Patricia Swigert and her sister Clelia Schaumann. During the  encounter,   Ronny Bacon was located at [facility] Radiation Oncology Department.  Analyn Montag Traughber was located at home, and her sister Chip Boer was at her own home.      Osker Mason, [facility]   **Disclaimer: This note was dictated with voice recognition software. Similar sounding words can inadvertently be transcribed and this note may contain transcription errors which may not have been corrected upon publication of note.**

## 2023-06-06 ENCOUNTER — Ambulatory Visit
Admission: RE | Admit: 2023-06-06 | Discharge: 2023-06-06 | Disposition: A | Discharge status: Home / Self Care | Payer: BC Managed Care – PPO | Source: Ambulatory Visit | Attending: Radiation Oncology | Admitting: Radiation Oncology

## 2023-06-06 ENCOUNTER — Other Ambulatory Visit: Payer: Self-pay

## 2023-06-06 DIAGNOSIS — C50411 Malignant neoplasm of upper-outer quadrant of right female breast: Secondary | ICD-10-CM | POA: Insufficient documentation

## 2023-06-06 DIAGNOSIS — C7951 Secondary malignant neoplasm of bone: Secondary | ICD-10-CM | POA: Diagnosis not present

## 2023-06-06 DIAGNOSIS — Z170 Estrogen receptor positive status [ER+]: Secondary | ICD-10-CM | POA: Insufficient documentation

## 2023-06-08 ENCOUNTER — Other Ambulatory Visit: Payer: Self-pay

## 2023-06-08 DIAGNOSIS — Z515 Encounter for palliative care: Secondary | ICD-10-CM

## 2023-06-08 DIAGNOSIS — Z170 Estrogen receptor positive status [ER+]: Secondary | ICD-10-CM

## 2023-06-08 DIAGNOSIS — G893 Neoplasm related pain (acute) (chronic): Secondary | ICD-10-CM

## 2023-06-08 MED ORDER — LIDOCAINE 5 % EX PTCH: 2.0000 | MEDICATED_PATCH | CUTANEOUS | 2 refills | Status: DC

## 2023-06-08 NOTE — Telephone Encounter (Signed)
This RN called to check in on the patient, patient and husband on the phone, reported doing well. Patient reported having a wonderful thanksgiving holiday where she could slow dance with her husband, which was a special moment and tradition for them. Patient also reported that because of the movement and radiation simulation he knee was achy. RN advised heat/cold packs as well as icy hot or voltaren gel (without use of heat pack). The increase in pain medication is helping to control pt pain and per pt she is tolerating it well. Patient and husband reviewed pain medications with RN, verbalized understanding of all education and recommendations. No further needs at this time.

## 2023-06-12 ENCOUNTER — Telehealth: Payer: Self-pay | Admitting: Radiation Oncology

## 2023-06-12 NOTE — Telephone Encounter (Signed)
I called and spoke with the patient about the planning that Dr. Mitzi Hansen is doing and the site they wired is her left ribcage, but she told me about her right chest wall symptoms. Dr. Mitzi Hansen favors treating both sites in one isocenter including bilateral lower ribs and T spine as one isocenter, as well as her left hip/pelvis extending to the proximal femur. She is in agreement and her consent will be updated.

## 2023-06-13 ENCOUNTER — Encounter: Payer: Self-pay | Admitting: Hematology and Oncology

## 2023-06-13 ENCOUNTER — Inpatient Hospital Stay: Payer: BC Managed Care – PPO

## 2023-06-13 ENCOUNTER — Ambulatory Visit
Admission: RE | Admit: 2023-06-13 | Discharge: 2023-06-13 | Disposition: A | Discharge status: Home / Self Care | Payer: BC Managed Care – PPO | Source: Ambulatory Visit | Attending: Radiation Oncology | Admitting: Radiation Oncology

## 2023-06-13 ENCOUNTER — Inpatient Hospital Stay: Payer: BC Managed Care – PPO | Attending: Hematology and Oncology | Admitting: Hematology and Oncology

## 2023-06-13 VITALS — BP 134/58 | HR 100 | Temp 98.4°F | Resp 18 | Ht 64.0 in | Wt 173.6 lb

## 2023-06-13 DIAGNOSIS — C7951 Secondary malignant neoplasm of bone: Secondary | ICD-10-CM | POA: Insufficient documentation

## 2023-06-13 DIAGNOSIS — Z5111 Encounter for antineoplastic chemotherapy: Secondary | ICD-10-CM | POA: Diagnosis not present

## 2023-06-13 DIAGNOSIS — Z510 Encounter for antineoplastic radiation therapy: Secondary | ICD-10-CM | POA: Diagnosis not present

## 2023-06-13 DIAGNOSIS — C50411 Malignant neoplasm of upper-outer quadrant of right female breast: Secondary | ICD-10-CM

## 2023-06-13 DIAGNOSIS — Z79899 Other long term (current) drug therapy: Secondary | ICD-10-CM | POA: Diagnosis not present

## 2023-06-13 DIAGNOSIS — Z170 Estrogen receptor positive status [ER+]: Secondary | ICD-10-CM | POA: Insufficient documentation

## 2023-06-13 DIAGNOSIS — R978 Other abnormal tumor markers: Secondary | ICD-10-CM | POA: Insufficient documentation

## 2023-06-13 LAB — CBC WITH DIFFERENTIAL/PLATELET
Abs Immature Granulocytes: 0.04 10*3/uL (ref 0.00–0.07)
Basophils Absolute: 0.1 10*3/uL (ref 0.0–0.1)
Basophils Relative: 1 %
Eosinophils Absolute: 0.1 10*3/uL (ref 0.0–0.5)
Eosinophils Relative: 1 %
HCT: 28.1 % — ABNORMAL LOW (ref 36.0–46.0)
Hemoglobin: 9 g/dL — ABNORMAL LOW (ref 12.0–15.0)
Immature Granulocytes: 1 %
Lymphocytes Relative: 6 %
Lymphs Abs: 0.3 10*3/uL — ABNORMAL LOW (ref 0.7–4.0)
MCH: 31.4 pg (ref 26.0–34.0)
MCHC: 32 g/dL (ref 30.0–36.0)
MCV: 97.9 fL (ref 80.0–100.0)
Monocytes Absolute: 0.6 10*3/uL (ref 0.1–1.0)
Monocytes Relative: 13 %
Neutro Abs: 3.7 10*3/uL (ref 1.7–7.7)
Neutrophils Relative %: 78 %
Platelets: 266 10*3/uL (ref 150–400)
RBC: 2.87 MIL/uL — ABNORMAL LOW (ref 3.87–5.11)
RDW: 23.3 % — ABNORMAL HIGH (ref 11.5–15.5)
WBC: 4.8 10*3/uL (ref 4.0–10.5)
nRBC: 0.8 % — ABNORMAL HIGH (ref 0.0–0.2)

## 2023-06-13 LAB — COMPREHENSIVE METABOLIC PANEL
ALT: 19 U/L (ref 0–44)
AST: 36 U/L (ref 15–41)
Albumin: 3.1 g/dL — ABNORMAL LOW (ref 3.5–5.0)
Alkaline Phosphatase: 239 U/L — ABNORMAL HIGH (ref 38–126)
Anion gap: 5 (ref 5–15)
BUN: 12 mg/dL (ref 8–23)
CO2: 31 mmol/L (ref 22–32)
Calcium: 9 mg/dL (ref 8.9–10.3)
Chloride: 104 mmol/L (ref 98–111)
Creatinine, Ser: 0.5 mg/dL (ref 0.44–1.00)
GFR, Estimated: >60 mL/min (ref >60)
Glucose, Bld: 118 mg/dL — ABNORMAL HIGH (ref 70–99)
Potassium: 3.8 mmol/L (ref 3.5–5.1)
Sodium: 140 mmol/L (ref 135–145)
Total Bilirubin: 0.7 mg/dL (ref <1.2)
Total Protein: 5.8 g/dL — ABNORMAL LOW (ref 6.5–8.1)

## 2023-06-13 MED ORDER — FULVESTRANT 250 MG/5ML IM SOSY
500.0000 mg | PREFILLED_SYRINGE | Freq: Once | INTRAMUSCULAR | Status: AC
  Administered 2023-06-13: 500 mg via INTRAMUSCULAR
  Filled 2023-06-13: qty 10

## 2023-06-13 NOTE — Progress Notes (Signed)
[facility] Cancer Follow up:    Doris Pierini, FNP [address] [place] [address]   DIAGNOSIS:  Cancer Staging  Malignant neoplasm of upper-outer quadrant of right breast in female, estrogen receptor positive (HCC) Staging form: Breast, AJCC 8th Edition - Pathologic: Stage IB (pT3, pN55mi, cM0, G2, ER+, PR+, HER2-) - Signed by Loa Socks, NP on 10/02/2018 Multigene prognostic tests performed: MammaPrint Histologic grading system: 3 grade system - Clinical stage from 12/28/2022: Stage IV (rcT2, cN0, cM1, G3, ER+, PR+, HER2-) - Signed by Rachel Moulds, MD on 12/28/2022 Stage prefix: Recurrence Histologic grading system: 3 grade system Laterality: Right Staged by: Pathologist and managing physician Stage used in treatment planning: Yes National guidelines used in treatment planning: Yes Type of national guideline used in treatment planning: NCCN   SUMMARY OF ONCOLOGIC HISTORY: Oncology History  Malignant neoplasm of upper-outer quadrant of right breast in female, estrogen receptor positive (HCC)  08/08/2018 Initial Diagnosis   status post right breast biopsy 08/02/2018 for a clinical T3 N0, stage IIA invasive lobular carcinoma, grade 2, estrogen receptor strongly positive, progesterone receptor 1% positive, with no HER-2 amplification and an MIB-1 of 1%.             (a) CT scan of the head and chest, without contrast 08/23/2018 showed nonspecific 0.4 cm left lower lobe pulmonary nodule, no definitive metastatic disease             (b) bone scan 08/23/2018 shows multiple spinal areas of abnormal uptake, but             (c) total spinal MRI 09/03/2018 finds bone scan findings to be secondary to degenerative disease, no evidence of metastatic disease.   08/2018 - 10/2022 Anti-estrogen oral therapy   Anastrozole; discontinued 10/14/2018 in preparation for chemotherapy, resumed October 2020   09/16/2018 Surgery   Right mastectomy [name]  [phone]): Invasive Lobular Carcinoma, 10.5 cm, grade 2, negative margins. 1 of 5 lymph nodes positive for carcinoma.   09/16/2018 Miscellaneous   MammaPrint "high risk" suggests a 5-year metastasis free survival of 93% with chemotherapy, with an absolute chemotherapy benefit in the greater than 12% range    09/16/2018 Miscellaneous   Caris testing on mastectomy sample (09/16/2018) showed stable MSI and proficient mismatch repair status, with a low mutational burden; BRCA 1 and 2 were not mutated, PI K3 was not mutated, ER B B2 was not mutated, and AKT 1 was not mutated. 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 Administration: 0.25 mg (11/05/2018), 0.25 mg (11/26/2018), 0.25 mg (12/17/2018), 0.25 mg (01/07/2019), 0.25 mg (01/28/2019), 0.25 mg (02/18/2019), 0.25 mg (03/11/2019), 0.25 mg (04/02/2019)  methotrexate (PF) chemo injection 84 mg, 39.8 mg/m2 = 84.5 mg, Intravenous,  Once, 5 of 5 cycles. Administration: 84 mg (11/05/2018), 84 mg (11/26/2018), 84 mg (12/17/2018), 84 mg (03/11/2019), 84 mg (04/02/2019)  pegfilgrastim-cbqv (UDENYCA) injection 6 mg, 6 mg, Subcutaneous, Once, 6 of 6 cycles. Administration: 6 mg (12/19/2018)  cyclophosphamide (CYTOXAN) 1,260 mg in sodium chloride 0.9 % 250 mL chemo infusion, 600 mg/m2 = 1,260 mg, Intravenous,  Once, 8 of 8 cycle. Administration: 1,260 mg (11/05/2018), 1,260 mg (11/26/2018), 1,260 mg (12/17/2018), 1,260 mg (01/07/2019), 1,260 mg (01/28/2019), 1,260 mg (02/18/2019), 1,260 mg (03/11/2019), 1,260 mg (04/02/2019)  fluorouracil (ADRUCIL) chemo injection 1,250  mg, 600 mg/m2 = 1,250 mg, Intravenous,  Once, 8 of 8 cycles. Administration: 1,250 mg (11/05/2018), 1,250 mg (11/26/2018), 1,250 mg (12/17/2018), 1,250 mg (01/07/2019), 1,250 mg (01/28/2019), 1,250 mg (02/18/2019), 1,250 mg (03/11/2019), 1,250 mg  (04/02/2019).    12/31/2018 - 02/19/2019 Radiation Therapy   The patient initially received a dose of 50.4 Gy in 28 fractions to the chest wall and supraclavicular region. This was delivered using a 3-D conformal, 4 field technique. The patient then received a boost to the mastectomy scar. This delivered an additional 10 Gy in 5 fractions using an en face electron field. The total dose was 60.4 Gy.   09/12/2022 Imaging   Bone scan on 09/12/2022 shows uptake in ribs, manubrium. Widespread osseous metastasis confirmed on PET scan that was completed on October 06, 2022. Right iliac crest biopsy demonstrated metastatic carcinoma consistent with patient's known breast carcinoma. ER 90% positive, PR 90% positive, Ki67 10%, HER2 negative.    10/10/2022 Treatment Plan Change   Faslodex beginning 10/10/2022; Ibrance beginning 12/20/2022; Zometa every 12 weeks 11/07/2022    10/23/2022 - 11/03/2022 Radiation Therapy   Palliative Radiation 10/23/2022-11/03/2022:  left chest wall, lower T spine, and right proximal hip/pelvis were each treated to 30 GY in 10 fractions.      CURRENT THERAPY: Faslodex, Mora Appl  INTERVAL HISTORY:  Doris Smith 72 y.o. female returns for follow-up and evaluation with her sister and brother in law. Discussed the use of AI scribe software for clinical note transcription with the patient, who gave verbal consent to proceed  The patient, diagnosed with stage 4 breast cancer with bone metastasis, presents with escalating pain, particularly in the groin and back. The pain has become so severe that it is affecting the patient's mobility, making it difficult for her to lift her feet and causing her to experience weakness. The patient reports that the pain is not well controlled with the current medication regimen, which includes a 12-hour pain medication increased to 10mg  in the morning and evening, and another pain medication increased to 10mg  every four hours as needed. The patient's  sister reports that the patient often needs the short-acting pain medication before the four-hour interval is up.  The patient has been undergoing radiation therapy, with the last session scheduled for the 18th of the month. The patient has also been prescribed Ibrance, but this medication has been causing her blood counts to drop significantly. The patient has expressed reluctance to continue with the Ibrance if it continues to negatively affect her blood counts.  Family once again has questions about the long term outlook.   Patient Active Problem List   Diagnosis Date Noted   Peripheral edema 12/29/2022   Restless leg syndrome 10/03/2021   Anxiety associated with depression 06/10/2020   Port-A-Cath in place 11/12/2018   Malignant neoplasm of upper-outer quadrant of right breast in female, estrogen receptor positive (HCC) 08/08/2018   Right knee pain 03/01/2015   Aortic atherosclerosis (HCC) 12/28/2014   Obesity (BMI 30-39.9) 04/09/2014   Insomnia due to stress 04/09/2014   Atrial fibrillation (HCC) 10/01/2013   GERD (gastroesophageal reflux disease) 10/01/2013   Bipolar disorder (HCC) 09/27/2007   Essential hypertension 09/27/2007   ALLERGIC RHINITIS 09/27/2007   IRRITABLE BOWEL SYNDROME 09/27/2007   Arthropathy 09/27/2007   DEGENERATIVE DISC DISEASE, LUMBOSACRAL SPINE 09/27/2007    is allergic to iohexol, codeine, palonosetron, prednisone, sulfonamide derivatives, celecoxib, sulfa antibiotics, and vitamin b12.  MEDICAL HISTORY: Past Medical History:  Diagnosis Date   A-fib (HCC)  11/2012   PAF   Anxiety    takes Ativan   Asthma 04/07/2011   dx   Bipolar disorder (HCC)    Cancer (HCC)    Right breast   Depression    Early cataracts, bilateral    Fatty liver    Fibromyalgia    GERD (gastroesophageal reflux disease)    Irritable bowel syndrome    Mental disorder    dx bipolar   PONV (postoperative nausea and vomiting)    Sleep apnea 04/2011    does not wear CPAP     SURGICAL HISTORY: Past Surgical History:  Procedure Laterality Date   ABDOMINAL HYSTERECTOMY     COLONOSCOPY     DILATION AND CURETTAGE OF UTERUS  1981   abnormal pap   KNEE ARTHROSCOPY Left 2007   x 2   LUMBAR LAMINECTOMY/DECOMPRESSION MICRODISCECTOMY  05/31/2011   Procedure: LUMBAR LAMINECTOMY/DECOMPRESSION MICRODISCECTOMY;  Surgeon: Javier Docker;  Location: [facility];  Service: Orthopedics;  Laterality: Left;  Decompression Lumbar four to five and  Lumbar five to Sacral one on Left  (X-Ray)   MASTECTOMY W/ SENTINEL NODE BIOPSY Right 09/16/2018   Procedure: RIGHT MASTECTOMY WITH RIGHT AXILLARY SENTINEL LYMPH NODE BIOPSY;  Surgeon: Ovidio Kin, MD;  Location: [facility] OR;  Service: General;  Laterality: Right;   PARTIAL HYSTERECTOMY  1982   PORTACATH PLACEMENT Left 10/31/2018   Procedure: INSERTION PORT-A-CATH WITH ULTRASOUND;  Surgeon: Ovidio Kin, MD;  Location: [facility];  Service: General;  Laterality: Left;   TONSILLECTOMY     as child   TOTAL KNEE ARTHROPLASTY Left 12/18/2005    SOCIAL HISTORY: Social History   Socioeconomic History   Marital status: Married    Spouse name: [name]   Number of children: 2   Years of education: 12   Highest education level: Not on file  Occupational History   Occupation: Disabled  Tobacco Use   Smoking status: Never   Smokeless tobacco: Never  Vaping Use   Vaping status: Never Used  Substance and Sexual Activity   Alcohol use: No   Drug use: No   Sexual activity: Yes    Birth control/protection: Post-menopausal, Surgical  Other Topics Concern   Not on file  Social History Narrative   Tea daily.  Rarely has caffeine    Social Determinants of Health   Financial Resource Strain: Low Risk  (08/18/2022)   Overall Financial Resource Strain (CARDIA)    Difficulty of Paying Living Expenses: Not hard at all  Food Insecurity: Low Risk  (09/25/2022)   [facility]   Hunger Vital Sign     Worried About Running Out of Food in the Last Year: Never true    Ran Out of Food in the Last Year: Never true  Transportation Needs: No Transportation Needs (10/04/2022)   [facility]   PRAPARE - Transportation    Lack of Transportation (Medical): No    Lack of Transportation (Non-Medical): No  Physical Activity: Inactive (08/18/2022)   Exercise Vital Sign    Days of Exercise per Week: 0 days    Minutes of Exercise per Session: 0 min  Stress: No Stress Concern Present (08/18/2022)   [employer] of Occupational Health - Occupational Stress Questionnaire    Feeling of Stress : Not at all  Social Connections: Moderately Integrated (08/18/2022)   Social Connection and Isolation Panel [NHANES]    Frequency of Communication with Friends and Family: More than three times  a week    Frequency of Social Gatherings with Friends and Family: More than three times a week    Attends Religious Services: More than 4 times per year    Active Member of [employer] or Organizations: No    Attends Banker Meetings: Never    Marital Status: Married  Catering manager Violence: Not At Risk (08/18/2022)   Humiliation, Afraid, Rape, and Kick questionnaire    Fear of Current or Ex-Partner: No    Emotionally Abused: No    Physically Abused: No    Sexually Abused: No    FAMILY HISTORY: Family History  Problem Relation Age of Onset   Anesthesia problems Mother    Heart disease Mother        CHF, atrial fib   Heart failure Mother    Anesthesia problems Sister    Colon polyps Father    Heart disease Father        "Fluid around the heart"   Hypertension Sister    Breast cancer Maternal Aunt    Colon cancer Maternal Aunt    Prostate cancer Neg Hx    Ovarian cancer Neg Hx     Review of Systems  Constitutional:  Positive for fatigue. Negative for appetite change, chills, fever and unexpected weight change.  HENT:   Negative for hearing loss, lump/mass and trouble  swallowing.   Eyes:  Negative for eye problems and icterus.  Respiratory:  Negative for chest tightness, cough and shortness of breath.   Cardiovascular:  Negative for chest pain, leg swelling and palpitations.  Gastrointestinal:  Negative for abdominal distention, abdominal pain, constipation, diarrhea, nausea and vomiting.  Endocrine: Negative for hot flashes.  Genitourinary:  Negative for difficulty urinating.   Musculoskeletal:  Positive for back pain. Negative for arthralgias.  Skin:  Negative for itching and rash.  Neurological:  Negative for dizziness, extremity weakness, headaches and numbness.  Hematological:  Negative for adenopathy. Does not bruise/bleed easily.  Psychiatric/Behavioral:  Negative for depression. The patient is not nervous/anxious.       PHYSICAL EXAMINATION     Vitals:   06/13/23 1439  BP: (!) 134/58  Pulse: 100  Resp: 18  Temp: 98.4 F (36.9 C)  SpO2: 98%     General appearance: ongoing tardive dyskinesia movements like lip smacking No other acute distress No regional adenopathy Ant lung fields clear No LE swelling.  LABORATORY DATA:  CBC    Component Value Date/Time   WBC 4.8 06/13/2023 1416   RBC 2.87 (L) 06/13/2023 1416   HGB 9.0 (L) 06/13/2023 1416   HGB 8.7 (L) 06/04/2023 1506   HGB 12.0 06/05/2022 1024   HCT 28.1 (L) 06/13/2023 1416   HCT 37.4 06/05/2022 1024   PLT 266 06/13/2023 1416   PLT 154 06/04/2023 1506   PLT 251 06/05/2022 1024   MCV 97.9 06/13/2023 1416   MCV 86 06/05/2022 1024   MCH 31.4 06/13/2023 1416   MCHC 32.0 06/13/2023 1416   RDW 23.3 (H) 06/13/2023 1416   RDW 15.5 (H) 06/05/2022 1024   LYMPHSABS 0.3 (L) 06/13/2023 1416   LYMPHSABS 0.9 06/05/2022 1024   MONOABS 0.6 06/13/2023 1416   EOSABS 0.1 06/13/2023 1416   EOSABS 0.1 06/05/2022 1024   BASOSABS 0.1 06/13/2023 1416   BASOSABS 0.1 06/05/2022 1024    CMP     Component Value Date/Time   NA 140 06/13/2023 1416   NA 145 (H) 06/05/2022 1024   K  3.8 06/13/2023 1416   CL  104 06/13/2023 1416   CO2 31 06/13/2023 1416   GLUCOSE 118 (H) 06/13/2023 1416   BUN 12 06/13/2023 1416   BUN 17 06/05/2022 1024   CREATININE 0.50 06/13/2023 1416   CREATININE 0.54 06/04/2023 1506   CALCIUM 9.0 06/13/2023 1416   PROT 5.8 (L) 06/13/2023 1416   PROT 6.6 06/05/2022 1024   ALBUMIN 3.1 (L) 06/13/2023 1416   ALBUMIN 4.0 06/05/2022 1024   AST 36 06/13/2023 1416   AST 32 06/04/2023 1506   ALT 19 06/13/2023 1416   ALT 16 06/04/2023 1506   ALKPHOS 239 (H) 06/13/2023 1416   BILITOT 0.7 06/13/2023 1416   BILITOT 0.8 06/04/2023 1506   GFRNONAA >60 06/13/2023 1416   GFRNONAA >60 06/04/2023 1506   GFRAA 81 02/03/2020 1049   GFRAA >60 12/19/2018 1434     ASSESSMENT and THERAPY PLAN:   Malignant neoplasm of upper-outer quadrant of right breast in female, estrogen receptor positive (HCC) Doris Smith is a 72 year-old woman with metastatic breast cancer here today for follow-up and evaluation.  Metastatic breast cancer to the bone: She is tolerating Faslodex and Zometa well she will continue this.  Based on [name]'s note from August 8, patient was adamant about not taking Ibrance and she was worried Ibrance because some rash on her legs.  Metastatic Breast Cancer (Bone Metastasis) Stable disease on PET scan with possible increased conspicuity on CT, possibly due to bone rebuilding from Zometa or cancer progression. Tumor marker is improving, suggesting decreased cancer activity. However, patient is experiencing increased pain, which is not fully explained. -Continue radiation therapy to painful areas. -Resume Ibrance at a reduced frequency (every other day) after completion of radiation therapy on 06/27/2023. -Check blood counts on 07/16/2023 to assess tolerance of reduced Ibrance frequency. -Consider MRI to evaluate for cord compression if weakness persists.  Pain Management Patient is experiencing increased pain despite current regimen. -Continue current  pain management regimen. -Communicate with pain clinic if short-acting pain medication is needed more frequently to consider titration of long-acting medication.  Follow-up -Return to clinic on 07/16/2023 for blood work and assessment of response to [facility]. -Consider another scan in 3 months to assess disease progression.  We discussed goals of care once again. She is relatively frail hence may not be able to tolerate aggressive treatments. She understands that there may be several options for treatment available but these may not be apt for her. When it comes to that point, she understands she is best served by staying comfortable.  All questions were answered. The patient knows to call the clinic with any problems, questions or concerns. We can certainly see the patient much sooner if necessary.  Total encounter time:40 minutes*in face-to-face visit time, chart review, lab review, care coordination, order entry, and documentation of the encounter time.   *Total Encounter Time as defined by the Centers for Medicare and Medicaid Services includes, in addition to the face-to-face time of a patient visit (documented in the note above) non-face-to-face time: obtaining and reviewing outside history, ordering and reviewing medications, tests or procedures, care coordination (communications with other health care professionals or caregivers) and documentation in the medical record.

## 2023-06-13 NOTE — Assessment & Plan Note (Signed)
Doris Smith is a 72 year-old woman with metastatic breast cancer here today for follow-up and evaluation.  Metastatic breast cancer to the bone: She is tolerating Faslodex and Zometa well she will continue this.  Based on [name]'s note from August 8, patient was adamant about not taking Ibrance and she was worried Ibrance because some rash on her legs.  Metastatic Breast Cancer (Bone Metastasis) Stable disease on PET scan with possible increased conspicuity on CT, possibly due to bone rebuilding from Zometa or cancer progression. Tumor marker is improving, suggesting decreased cancer activity. However, patient is experiencing increased pain, which is not fully explained. -Continue radiation therapy to painful areas. -Resume Ibrance at a reduced frequency (every other day) after completion of radiation therapy on 06/27/2023. -Check blood counts on 07/16/2023 to assess tolerance of reduced Ibrance frequency. -Consider MRI to evaluate for cord compression if weakness persists.  Pain Management Patient is experiencing increased pain despite current regimen. -Continue current pain management regimen. -Communicate with pain clinic if short-acting pain medication is needed more frequently to consider titration of long-acting medication.  Follow-up -Return to clinic on 07/16/2023 for blood work and assessment of response to [facility]. -Consider another scan in 3 months to assess disease progression.

## 2023-06-14 ENCOUNTER — Encounter: Payer: Self-pay | Admitting: Hematology and Oncology

## 2023-06-14 ENCOUNTER — Other Ambulatory Visit: Payer: Self-pay

## 2023-06-14 ENCOUNTER — Other Ambulatory Visit: Payer: Self-pay | Admitting: Nurse Practitioner

## 2023-06-14 DIAGNOSIS — G2581 Restless legs syndrome: Secondary | ICD-10-CM

## 2023-06-14 DIAGNOSIS — Z170 Estrogen receptor positive status [ER+]: Secondary | ICD-10-CM | POA: Diagnosis not present

## 2023-06-14 DIAGNOSIS — C7951 Secondary malignant neoplasm of bone: Secondary | ICD-10-CM | POA: Diagnosis not present

## 2023-06-14 DIAGNOSIS — Z510 Encounter for antineoplastic radiation therapy: Secondary | ICD-10-CM | POA: Diagnosis not present

## 2023-06-14 DIAGNOSIS — C50411 Malignant neoplasm of upper-outer quadrant of right female breast: Secondary | ICD-10-CM | POA: Diagnosis not present

## 2023-06-14 LAB — RAD ONC ARIA SESSION SUMMARY
Course Elapsed Days: 0
Plan Fractions Treated to Date: 1
Plan Fractions Treated to Date: 1
Plan Fractions Treated to Date: 1
Plan Prescribed Dose Per Fraction: 3 Gy
Plan Prescribed Dose Per Fraction: 3 Gy
Plan Prescribed Dose Per Fraction: 3 Gy
Plan Total Fractions Prescribed: 10
Plan Total Fractions Prescribed: 10
Plan Total Fractions Prescribed: 10
Plan Total Prescribed Dose: 30 Gy
Plan Total Prescribed Dose: 30 Gy
Plan Total Prescribed Dose: 30 Gy
Reference Point Dosage Given to Date: 3 Gy
Reference Point Dosage Given to Date: 3 Gy
Reference Point Dosage Given to Date: 3 Gy
Reference Point Session Dosage Given: 3 Gy
Reference Point Session Dosage Given: 3 Gy
Reference Point Session Dosage Given: 3 Gy
Session Number: 1

## 2023-06-14 LAB — CANCER ANTIGEN 27.29: CA 27.29: 1186.7 U/mL — ABNORMAL HIGH (ref 0.0–38.6)

## 2023-06-14 NOTE — Telephone Encounter (Signed)
Last office visit 05/24/23  Mirazpex last refill 05/18/23 #90, no refills Latuda listed as a historical med and requires your approval

## 2023-06-15 ENCOUNTER — Ambulatory Visit: Payer: BC Managed Care – PPO | Admitting: Hematology and Oncology

## 2023-06-15 ENCOUNTER — Ambulatory Visit: Payer: BC Managed Care – PPO

## 2023-06-15 ENCOUNTER — Other Ambulatory Visit: Payer: Self-pay | Admitting: *Deleted

## 2023-06-15 ENCOUNTER — Ambulatory Visit
Admission: RE | Admit: 2023-06-15 | Discharge: 2023-06-15 | Disposition: A | Discharge status: Home / Self Care | Payer: BC Managed Care – PPO | Source: Ambulatory Visit | Attending: Radiation Oncology | Admitting: Radiation Oncology

## 2023-06-15 ENCOUNTER — Other Ambulatory Visit: Payer: Self-pay

## 2023-06-15 DIAGNOSIS — Z170 Estrogen receptor positive status [ER+]: Secondary | ICD-10-CM | POA: Diagnosis not present

## 2023-06-15 DIAGNOSIS — C7951 Secondary malignant neoplasm of bone: Secondary | ICD-10-CM | POA: Diagnosis not present

## 2023-06-15 DIAGNOSIS — C50411 Malignant neoplasm of upper-outer quadrant of right female breast: Secondary | ICD-10-CM | POA: Diagnosis not present

## 2023-06-15 DIAGNOSIS — Z510 Encounter for antineoplastic radiation therapy: Secondary | ICD-10-CM | POA: Diagnosis not present

## 2023-06-15 LAB — RAD ONC ARIA SESSION SUMMARY
Course Elapsed Days: 1
Plan Fractions Treated to Date: 2
Plan Fractions Treated to Date: 2
Plan Fractions Treated to Date: 2
Plan Prescribed Dose Per Fraction: 3 Gy
Plan Prescribed Dose Per Fraction: 3 Gy
Plan Prescribed Dose Per Fraction: 3 Gy
Plan Total Fractions Prescribed: 10
Plan Total Fractions Prescribed: 10
Plan Total Fractions Prescribed: 10
Plan Total Prescribed Dose: 30 Gy
Plan Total Prescribed Dose: 30 Gy
Plan Total Prescribed Dose: 30 Gy
Reference Point Dosage Given to Date: 6 Gy
Reference Point Dosage Given to Date: 6 Gy
Reference Point Dosage Given to Date: 6 Gy
Reference Point Session Dosage Given: 3 Gy
Reference Point Session Dosage Given: 3 Gy
Reference Point Session Dosage Given: 3 Gy
Session Number: 2

## 2023-06-18 ENCOUNTER — Inpatient Hospital Stay: Payer: BC Managed Care – PPO

## 2023-06-18 ENCOUNTER — Ambulatory Visit
Admission: RE | Admit: 2023-06-18 | Discharge: 2023-06-18 | Disposition: A | Discharge status: Home / Self Care | Payer: BC Managed Care – PPO | Source: Ambulatory Visit | Attending: Radiation Oncology

## 2023-06-18 ENCOUNTER — Other Ambulatory Visit: Payer: Self-pay

## 2023-06-18 DIAGNOSIS — Z510 Encounter for antineoplastic radiation therapy: Secondary | ICD-10-CM | POA: Diagnosis not present

## 2023-06-18 DIAGNOSIS — C7951 Secondary malignant neoplasm of bone: Secondary | ICD-10-CM | POA: Diagnosis not present

## 2023-06-18 DIAGNOSIS — C50411 Malignant neoplasm of upper-outer quadrant of right female breast: Secondary | ICD-10-CM | POA: Diagnosis not present

## 2023-06-18 DIAGNOSIS — Z170 Estrogen receptor positive status [ER+]: Secondary | ICD-10-CM | POA: Diagnosis not present

## 2023-06-18 LAB — RAD ONC ARIA SESSION SUMMARY
Course Elapsed Days: 4
Plan Fractions Treated to Date: 3
Plan Fractions Treated to Date: 3
Plan Fractions Treated to Date: 3
Plan Prescribed Dose Per Fraction: 3 Gy
Plan Prescribed Dose Per Fraction: 3 Gy
Plan Prescribed Dose Per Fraction: 3 Gy
Plan Total Fractions Prescribed: 10
Plan Total Fractions Prescribed: 10
Plan Total Fractions Prescribed: 10
Plan Total Prescribed Dose: 30 Gy
Plan Total Prescribed Dose: 30 Gy
Plan Total Prescribed Dose: 30 Gy
Reference Point Dosage Given to Date: 9 Gy
Reference Point Dosage Given to Date: 9 Gy
Reference Point Dosage Given to Date: 9 Gy
Reference Point Session Dosage Given: 3 Gy
Reference Point Session Dosage Given: 3 Gy
Reference Point Session Dosage Given: 3 Gy
Session Number: 3

## 2023-06-18 NOTE — Progress Notes (Signed)
Nutrition  No answer when called for scheduled nutrition phone follow-up visit.  Left message with call back number.  Marionna Gonia B. Freida Busman, RD, LDN Registered Dietitian [phone]

## 2023-06-19 ENCOUNTER — Ambulatory Visit
Admission: RE | Admit: 2023-06-19 | Discharge: 2023-06-19 | Discharge status: Home / Self Care | Payer: BC Managed Care – PPO | Source: Ambulatory Visit | Attending: Radiation Oncology

## 2023-06-19 ENCOUNTER — Other Ambulatory Visit: Payer: Self-pay

## 2023-06-19 DIAGNOSIS — Z510 Encounter for antineoplastic radiation therapy: Secondary | ICD-10-CM | POA: Diagnosis not present

## 2023-06-19 DIAGNOSIS — F312 Bipolar disorder, current episode manic severe with psychotic features: Secondary | ICD-10-CM | POA: Diagnosis not present

## 2023-06-19 DIAGNOSIS — C50411 Malignant neoplasm of upper-outer quadrant of right female breast: Secondary | ICD-10-CM | POA: Diagnosis not present

## 2023-06-19 DIAGNOSIS — C7951 Secondary malignant neoplasm of bone: Secondary | ICD-10-CM | POA: Diagnosis not present

## 2023-06-19 DIAGNOSIS — F410 Panic disorder [episodic paroxysmal anxiety] without agoraphobia: Secondary | ICD-10-CM | POA: Diagnosis not present

## 2023-06-19 DIAGNOSIS — Z170 Estrogen receptor positive status [ER+]: Secondary | ICD-10-CM | POA: Diagnosis not present

## 2023-06-19 DIAGNOSIS — F4310 Post-traumatic stress disorder, unspecified: Secondary | ICD-10-CM | POA: Diagnosis not present

## 2023-06-19 LAB — RAD ONC ARIA SESSION SUMMARY
Course Elapsed Days: 5
Plan Fractions Treated to Date: 4
Plan Fractions Treated to Date: 4
Plan Fractions Treated to Date: 4
Plan Prescribed Dose Per Fraction: 3 Gy
Plan Prescribed Dose Per Fraction: 3 Gy
Plan Prescribed Dose Per Fraction: 3 Gy
Plan Total Fractions Prescribed: 10
Plan Total Fractions Prescribed: 10
Plan Total Fractions Prescribed: 10
Plan Total Prescribed Dose: 30 Gy
Plan Total Prescribed Dose: 30 Gy
Plan Total Prescribed Dose: 30 Gy
Reference Point Dosage Given to Date: 12 Gy
Reference Point Dosage Given to Date: 12 Gy
Reference Point Dosage Given to Date: 12 Gy
Reference Point Session Dosage Given: 3 Gy
Reference Point Session Dosage Given: 3 Gy
Reference Point Session Dosage Given: 3 Gy
Session Number: 4

## 2023-06-20 ENCOUNTER — Ambulatory Visit
Admission: RE | Admit: 2023-06-20 | Discharge: 2023-06-20 | Disposition: A | Discharge status: Home / Self Care | Payer: BC Managed Care – PPO | Source: Ambulatory Visit | Attending: Radiation Oncology | Admitting: Radiation Oncology

## 2023-06-20 ENCOUNTER — Other Ambulatory Visit: Payer: Self-pay

## 2023-06-20 DIAGNOSIS — Z510 Encounter for antineoplastic radiation therapy: Secondary | ICD-10-CM | POA: Diagnosis not present

## 2023-06-20 DIAGNOSIS — C7951 Secondary malignant neoplasm of bone: Secondary | ICD-10-CM | POA: Diagnosis not present

## 2023-06-20 DIAGNOSIS — Z170 Estrogen receptor positive status [ER+]: Secondary | ICD-10-CM | POA: Diagnosis not present

## 2023-06-20 DIAGNOSIS — C50411 Malignant neoplasm of upper-outer quadrant of right female breast: Secondary | ICD-10-CM | POA: Diagnosis not present

## 2023-06-20 LAB — RAD ONC ARIA SESSION SUMMARY
Course Elapsed Days: 6
Plan Fractions Treated to Date: 5
Plan Fractions Treated to Date: 5
Plan Fractions Treated to Date: 5
Plan Prescribed Dose Per Fraction: 3 Gy
Plan Prescribed Dose Per Fraction: 3 Gy
Plan Prescribed Dose Per Fraction: 3 Gy
Plan Total Fractions Prescribed: 10
Plan Total Fractions Prescribed: 10
Plan Total Fractions Prescribed: 10
Plan Total Prescribed Dose: 30 Gy
Plan Total Prescribed Dose: 30 Gy
Plan Total Prescribed Dose: 30 Gy
Reference Point Dosage Given to Date: 15 Gy
Reference Point Dosage Given to Date: 15 Gy
Reference Point Dosage Given to Date: 15 Gy
Reference Point Session Dosage Given: 3 Gy
Reference Point Session Dosage Given: 3 Gy
Reference Point Session Dosage Given: 3 Gy
Session Number: 5

## 2023-06-21 ENCOUNTER — Encounter: Payer: Self-pay | Admitting: Nurse Practitioner

## 2023-06-21 ENCOUNTER — Ambulatory Visit
Admission: RE | Admit: 2023-06-21 | Discharge: 2023-06-21 | Disposition: A | Discharge status: Home / Self Care | Payer: BC Managed Care – PPO | Source: Ambulatory Visit | Attending: Radiation Oncology | Admitting: Radiation Oncology

## 2023-06-21 ENCOUNTER — Inpatient Hospital Stay (HOSPITAL_BASED_OUTPATIENT_CLINIC_OR_DEPARTMENT_OTHER): Payer: BC Managed Care – PPO | Admitting: Nurse Practitioner

## 2023-06-21 ENCOUNTER — Other Ambulatory Visit: Payer: Self-pay

## 2023-06-21 ENCOUNTER — Telehealth: Payer: Self-pay

## 2023-06-21 ENCOUNTER — Telehealth: Payer: Self-pay | Admitting: Family Medicine

## 2023-06-21 VITALS — BP 147/109 | HR 90 | Temp 98.1°F | Resp 17 | Wt 174.0 lb

## 2023-06-21 DIAGNOSIS — Z515 Encounter for palliative care: Secondary | ICD-10-CM

## 2023-06-21 DIAGNOSIS — R630 Anorexia: Secondary | ICD-10-CM

## 2023-06-21 DIAGNOSIS — Z510 Encounter for antineoplastic radiation therapy: Secondary | ICD-10-CM | POA: Diagnosis not present

## 2023-06-21 DIAGNOSIS — R530 Neoplastic (malignant) related fatigue: Secondary | ICD-10-CM

## 2023-06-21 DIAGNOSIS — C50411 Malignant neoplasm of upper-outer quadrant of right female breast: Secondary | ICD-10-CM

## 2023-06-21 DIAGNOSIS — Z170 Estrogen receptor positive status [ER+]: Secondary | ICD-10-CM

## 2023-06-21 DIAGNOSIS — G893 Neoplasm related pain (acute) (chronic): Secondary | ICD-10-CM

## 2023-06-21 DIAGNOSIS — C7951 Secondary malignant neoplasm of bone: Secondary | ICD-10-CM | POA: Diagnosis not present

## 2023-06-21 LAB — RAD ONC ARIA SESSION SUMMARY
Course Elapsed Days: 7
Plan Fractions Treated to Date: 6
Plan Fractions Treated to Date: 6
Plan Fractions Treated to Date: 6
Plan Prescribed Dose Per Fraction: 3 Gy
Plan Prescribed Dose Per Fraction: 3 Gy
Plan Prescribed Dose Per Fraction: 3 Gy
Plan Total Fractions Prescribed: 10
Plan Total Fractions Prescribed: 10
Plan Total Fractions Prescribed: 10
Plan Total Prescribed Dose: 30 Gy
Plan Total Prescribed Dose: 30 Gy
Plan Total Prescribed Dose: 30 Gy
Reference Point Dosage Given to Date: 18 Gy
Reference Point Dosage Given to Date: 18 Gy
Reference Point Dosage Given to Date: 18 Gy
Reference Point Session Dosage Given: 3 Gy
Reference Point Session Dosage Given: 3 Gy
Reference Point Session Dosage Given: 3 Gy
Session Number: 6

## 2023-06-21 NOTE — Progress Notes (Signed)
Palliative Medicine [facility]  [phone] [phone]   Name: Doris Smith Date: 06/21/2023 MRN: [id number]  DOB: 1950/11/15  Patient Care Team: [name], FNP as PCP - General (Family Medicine) Jodelle Gross, NP as PCP - Cardiology (Nurse Practitioner) Ovidio Kin, MD as Consulting Physician (General Surgery) Dorothy Puffer, MD as Consulting Physician (Radiation Oncology) Lysle Pearl, MD as Consulting Physician (Pulmonary Disease) Beverely Low, MD as Consulting Physician (Orthopedic Surgery) Jene Every, MD as Consulting Physician (Orthopedic Surgery) Oneta Rack, NP as Nurse Practitioner (Adult Health Nurse Practitioner) Pershing Proud, RN as Oncology Nurse Navigator Donnelly Angelica, RN as Oncology Nurse Navigator Glenna Fellows, MD as Consulting Physician (Plastic Surgery) Rollene Rotunda, MD as Consulting Physician (Cardiology) Rachel Moulds, MD as Medical Oncologist (Hematology and Oncology) Pickenpack-Cousar, Arty Baumgartner, NP as Nurse Practitioner (Hospice and Palliative Medicine)    INTERVAL HISTORY: Doris Smith is a 72 y.o. female with oncologic medical history including estrogen receptor positive breast cancer(07/2018) s/p right mastectomy. PET scan 10/06/22 showing widespread osseous metastasis. Other pertinent history includes atrial fibrillation, asthma, OSA, GERD, fatty liver, fibromyalgia, osteoporosis, chronic headaches, anxiety, depression, and bipolar disorder. Underwent ORIF of right hip 09/20/2022. Palliative ask to see for symptom and pain management and goals of care.   SOCIAL HISTORY:    Doris Smith reports that she has never smoked. She has never used smokeless tobacco. She reports that she does not drink alcohol and does not use drugs.  ADVANCE DIRECTIVES:  Advanced directives on file, Doris Smith, spouse, was identified as Management consultant should patient be unable to make  decision for herself   CODE STATUS: Full code  PAST MEDICAL HISTORY: Past Medical History:  Diagnosis Date   A-fib (HCC) 11/2012   PAF   Anxiety    takes Ativan   Asthma 04/07/2011   dx   Bipolar disorder (HCC)    Cancer (HCC)    Right breast   Depression    Early cataracts, bilateral    Fatty liver    Fibromyalgia    GERD (gastroesophageal reflux disease)    Irritable bowel syndrome    Mental disorder    dx bipolar   PONV (postoperative nausea and vomiting)    Sleep apnea 04/2011    does not wear CPAP    ALLERGIES:  is allergic to iohexol, codeine, palonosetron, prednisone, sulfonamide derivatives, celecoxib, sulfa antibiotics, and vitamin b12.  MEDICATIONS:  Current Outpatient Medications  Medication Sig Dispense Refill   apixaban (ELIQUIS) 5 MG TABS tablet Take 1 tablet (5 mg total) by mouth 2 (two) times daily. 180 tablet 1   clotrimazole (MYCELEX) 10 MG troche Take 1 tablet (10 mg total) by mouth 5 (five) times daily. 50 Troche 0   clotrimazole-betamethasone (LOTRISONE) cream APPLY  CREAM TOPICALLY TWICE DAILY 45 g 0   esomeprazole (NEXIUM) 40 MG capsule Take 1 capsule (40 mg total) by mouth daily. 90 capsule 0   fluticasone (FLONASE) 50 MCG/ACT nasal spray Place 2 sprays into Smith nostrils daily. 16 g 6   furosemide (LASIX) 20 MG tablet Take 1 tablet (20 mg total) by mouth daily. 90 tablet 1   Incontinence Supply Disposable (CERTAINTY FITTED BRIEFS XL) MISC 1 Package by Does not apply route as needed. 56 each 3   lidocaine (LIDODERM) 5 % Place 2 patches onto the skin daily. Remove & Discard patch within 12 hours or as directed by MD 60 patch 2   loratadine (CLARITIN) 10  MG tablet Take 10 mg by mouth daily.     LORazepam (ATIVAN) 1 MG tablet Take 1 tablet (1 mg total) by mouth 2 (two) times daily as needed for anxiety. 60 tablet 5   Lurasidone HCl 60 MG TABS Take 1 tablet by mouth once daily 30 tablet 0   magic mouthwash (nystatin, diphenhydrAMINE, alum & mag  hydroxide) suspension mixture Swish and swallow 5 mLs 4 (four) times daily as needed for mouth pain. 240 mL 1   metoprolol tartrate (LOPRESSOR) 25 MG tablet Take 1 tablet (25 mg total) by mouth 2 (two) times daily. 180 tablet 1   midodrine (PROAMATINE) 2.5 MG tablet Take 1 tablet (2.5 mg total) by mouth 3 (three) times daily with meals. 270 tablet 3   nystatin (MYCOSTATIN) 100000 UNIT/ML suspension Take 5 mLs (500,000 Units total) by mouth 4 (four) times daily. 60 mL 0   ondansetron (ZOFRAN) 8 MG tablet Take 1 tablet (8 mg total) by mouth every 8 (eight) hours as needed for nausea or vomiting. 30 tablet 5   oxyCODONE (OXYCONTIN) 10 mg 12 hr tablet Take 1 tablet (10 mg total) by mouth every 12 (twelve) hours. 60 tablet 0   oxyCODONE-acetaminophen (PERCOCET) 10-325 MG tablet Take 1 tablet by mouth every 4 (four) hours as needed for pain. 60 tablet 0   palbociclib (IBRANCE) 75 MG tablet Take 1 tablet (75 mg total) by mouth daily. Take for 21 days on, 7 days off, repeat every 28 days. 21 tablet 3   polyethylene glycol (MIRALAX / GLYCOLAX) 17 g packet Take 17 g by mouth daily.     pramipexole (MIRAPEX) 1 MG tablet TAKE 1 TABLET BY MOUTH THREE TIMES DAILY 90 tablet 0   prochlorperazine (COMPAZINE) 10 MG tablet Take 1 tablet (10 mg total) by mouth every 6 (six) hours as needed for nausea or vomiting. 30 tablet 2   triamcinolone (KENALOG) 0.1 % Apply 1 application. topically 2 (two) times daily.     Zoledronic Acid (ZOMETA IV) Inject into the vein.     No current facility-administered medications for this visit.   Facility-Administered Medications Ordered in Other Visits  Medication Dose Route Frequency Provider Last Rate Last Admin   heparin lock flush 100 unit/mL  500 Units Intravenous Once Magrinat, Valentino Hue, MD       sodium chloride flush (NS) 0.9 % injection 10 mL  10 mL Intravenous PRN Magrinat, Valentino Hue, MD        VITAL SIGNS: There were no vitals taken for this visit. There were no vitals  filed for this visit.    Estimated body mass index is 29.8 kg/m as calculated from the following:   Height as of 06/13/23: 5\' 4"  (1.626 m).   Weight as of 06/13/23: 173 lb 9.6 oz (78.7 kg).   PERFORMANCE STATUS (ECOG) : 2 - Symptomatic, <50% confined to bed  Assessment NAD, in wheelchair RRR Normal breathing pattern AAO x3  Discussed the use of AI scribe software for clinical note transcription with the patient, who gave verbal consent to proceed.   IMPRESSION:  Doris Smith presents to clinic today for symptom management follow-up. Her husband is present. She is currently undergoing radiation therapy. States she is tolerating overall. Some increased fatigue and over the past couple of days intermittent nausea post treatment. This is being managed with her prescribed Zofran. Denies constipation or diarrhea.   The patient's appetite is described as variable, with some foods being more palatable than others. The patient was  able to consume three-fourths of a craved cheeseburger the previous day. She is appreciative of this. No weight loss noted.   Doris Smith states her pain is much better controlled on current regimen. She expresses appreciation of this. Education provided that radiation treatments are most likely contributing to her improvements in addition to medication. The patient does endorse some ongoing discomfort across the chest area. Doris Smith and her husband are able to appropriately identify medication schedule which correlates with prescribed schedule. She is taking Xtampza every 12 hours with oxycodone every 6 hours available for breakthrough pain. We discussed method for receiving her medications when they are due next week. She would like to have them filled at [facility] pharmacy after she completes her radiation appointment on 11/18. Request acknowledged. No changes to current regimen at this time. Patient and husband knows to contact office as needed. We did attempt to reach Doris Smith  sister via patient's phone number several times. Unable to reach. Advised patient and husband to have [place] to give office a call.   Goals of Care  5/23: Doris Smith is emotional. Her husband becomes emotional as patient shares she knows her health is not the best and her cancer is not curable. She is taking life one day at time. Speaks to when her time comes she will be ready but hopeful it is not now. She knows that no one has a definitive time frame. Expresses her appreciation in all of the care and support. Expresses her goal is to continue to treat the treatable allowing her the ability to continue to thrive while not suffering.   4/30 ;Since the night of 10/30/22, Doris Smith has continued to meet goal of lying in her own bed at night beside her husband. She expresses appreciation for the care of everyone at the cancer center and is hopeful for continued improvement.  We discussed Her current illness and what it means in the larger context of her on-going co-morbidities. Natural disease trajectory and expectations were discussed. We discussed the importance of continued conversation with family and their medical providers regarding overall plan of care and treatment options, ensuring decisions are within the context of the patients values and GOCs.  PLAN:  Cancer with Radiation Therapy Reports of nausea and vomiting, possibly secondary to radiation therapy. Pain is managed with --Oxycodone, as needed every 4-6 hours  -Continue current pain management regimen. -Continue Zofran for nausea and vomiting. -Consider further evaluation if symptoms persist.  Hypertension Initial blood pressure reading was high (147/109), but subsequent readings were significantly lower (59/58, 109/55). The initial reading may have been influenced by recent radiation therapy. -Continue current antihypertensive regimen. -Recheck blood pressure at next visit. -Recheck 109/55 within limits   Medication  Refill Oxycodone and OxyContin due for refill on 06/27/2023. -Refill prescriptions to be picked up at pharmacy on 06/27/2023 during radiation appointment.  Follow-up in a couple of weeks after the holidays. Patient expressed understanding and was in agreement with this plan. She also understands that she an call the clinic at any time with any questions, concerns, or complaints.   Any controlled substances utilized were prescribed in the context of palliative care. PDMP has been reviewed.    Visit consisted of counseling and education dealing with the complex and emotionally intense issues of symptom management and palliative care in the setting of serious and potentially life-threatening illness.   Willette Alma, AGPCNP-BC  Palliative Medicine Team/[facility]

## 2023-06-21 NOTE — Addendum Note (Signed)
Addended by: [name] on: 06/21/2023 01:27 PM   Modules accepted: Orders

## 2023-06-21 NOTE — Telephone Encounter (Signed)
Copied from CRM [id number]. Topic: Clinical - Prescription Issue >> Jun 20, 2023  4:45 PM Hector Shade B wrote: Reason for CRM:  Lurasidone HCl 60 MG TABS Patient is request this prescription is removed from her refills. She no longer takes this medication; but the pharmacy is filling it.

## 2023-06-21 NOTE — Telephone Encounter (Signed)
Copied from CRM [id number]. Topic: Clinical - Prescription Issue >> Jun 21, 2023  9:10 AM Theodis Sato wrote: Lurasitone 60 mgs  Reason for CRM: PT called to confirm with Doris Smith the Lurasidone HCl 60 MG TABS needs to be taken off her med list as PT states she has not taken it in over 6 months

## 2023-06-21 NOTE — Telephone Encounter (Signed)
Lmtcb to confirm medication before removing

## 2023-06-22 ENCOUNTER — Other Ambulatory Visit: Payer: Self-pay

## 2023-06-22 ENCOUNTER — Ambulatory Visit
Admission: RE | Admit: 2023-06-22 | Discharge: 2023-06-22 | Disposition: A | Discharge status: Home / Self Care | Payer: BC Managed Care – PPO | Source: Ambulatory Visit | Attending: Radiation Oncology

## 2023-06-22 ENCOUNTER — Telehealth: Payer: Self-pay | Admitting: Radiation Oncology

## 2023-06-22 ENCOUNTER — Encounter: Payer: Self-pay | Admitting: Hematology and Oncology

## 2023-06-22 ENCOUNTER — Ambulatory Visit
Admission: RE | Admit: 2023-06-22 | Discharge: 2023-06-22 | Disposition: A | Discharge status: Home / Self Care | Payer: BC Managed Care – PPO | Source: Ambulatory Visit | Attending: Radiation Oncology | Admitting: Radiation Oncology

## 2023-06-22 DIAGNOSIS — C7951 Secondary malignant neoplasm of bone: Secondary | ICD-10-CM | POA: Diagnosis not present

## 2023-06-22 DIAGNOSIS — C50411 Malignant neoplasm of upper-outer quadrant of right female breast: Secondary | ICD-10-CM | POA: Diagnosis not present

## 2023-06-22 DIAGNOSIS — Z170 Estrogen receptor positive status [ER+]: Secondary | ICD-10-CM | POA: Diagnosis not present

## 2023-06-22 DIAGNOSIS — Z510 Encounter for antineoplastic radiation therapy: Secondary | ICD-10-CM | POA: Diagnosis not present

## 2023-06-22 LAB — RAD ONC ARIA SESSION SUMMARY
Course Elapsed Days: 8
Plan Fractions Treated to Date: 7
Plan Fractions Treated to Date: 7
Plan Fractions Treated to Date: 7
Plan Prescribed Dose Per Fraction: 3 Gy
Plan Prescribed Dose Per Fraction: 3 Gy
Plan Prescribed Dose Per Fraction: 3 Gy
Plan Total Fractions Prescribed: 10
Plan Total Fractions Prescribed: 10
Plan Total Fractions Prescribed: 10
Plan Total Prescribed Dose: 30 Gy
Plan Total Prescribed Dose: 30 Gy
Plan Total Prescribed Dose: 30 Gy
Reference Point Dosage Given to Date: 21 Gy
Reference Point Dosage Given to Date: 21 Gy
Reference Point Dosage Given to Date: 21 Gy
Reference Point Session Dosage Given: 3 Gy
Reference Point Session Dosage Given: 3 Gy
Reference Point Session Dosage Given: 3 Gy
Session Number: 7

## 2023-06-22 NOTE — Telephone Encounter (Signed)
12/13 @ 8:55 am Patient called with concerns of constantly vomiting up her food and meds before an after her treatments.  Patient stated will be here for her treatments for today.  Secure chat sent to [facility] so they are aware.

## 2023-06-25 ENCOUNTER — Other Ambulatory Visit: Payer: Self-pay | Admitting: Nurse Practitioner

## 2023-06-25 ENCOUNTER — Other Ambulatory Visit (HOSPITAL_COMMUNITY): Payer: Self-pay

## 2023-06-25 ENCOUNTER — Ambulatory Visit
Admission: RE | Admit: 2023-06-25 | Discharge: 2023-06-25 | Disposition: A | Discharge status: Home / Self Care | Payer: BC Managed Care – PPO | Source: Ambulatory Visit | Attending: Radiation Oncology | Admitting: Radiation Oncology

## 2023-06-25 ENCOUNTER — Other Ambulatory Visit: Payer: Self-pay

## 2023-06-25 DIAGNOSIS — Z170 Estrogen receptor positive status [ER+]: Secondary | ICD-10-CM

## 2023-06-25 DIAGNOSIS — Z510 Encounter for antineoplastic radiation therapy: Secondary | ICD-10-CM | POA: Diagnosis not present

## 2023-06-25 DIAGNOSIS — Z515 Encounter for palliative care: Secondary | ICD-10-CM

## 2023-06-25 DIAGNOSIS — C7951 Secondary malignant neoplasm of bone: Secondary | ICD-10-CM | POA: Diagnosis not present

## 2023-06-25 DIAGNOSIS — G893 Neoplasm related pain (acute) (chronic): Secondary | ICD-10-CM

## 2023-06-25 DIAGNOSIS — C50411 Malignant neoplasm of upper-outer quadrant of right female breast: Secondary | ICD-10-CM | POA: Diagnosis not present

## 2023-06-25 DIAGNOSIS — M898X9 Other specified disorders of bone, unspecified site: Secondary | ICD-10-CM

## 2023-06-25 LAB — RAD ONC ARIA SESSION SUMMARY
Course Elapsed Days: 11
Plan Fractions Treated to Date: 8
Plan Fractions Treated to Date: 8
Plan Fractions Treated to Date: 8
Plan Prescribed Dose Per Fraction: 3 Gy
Plan Prescribed Dose Per Fraction: 3 Gy
Plan Prescribed Dose Per Fraction: 3 Gy
Plan Total Fractions Prescribed: 10
Plan Total Fractions Prescribed: 10
Plan Total Fractions Prescribed: 10
Plan Total Prescribed Dose: 30 Gy
Plan Total Prescribed Dose: 30 Gy
Plan Total Prescribed Dose: 30 Gy
Reference Point Dosage Given to Date: 24 Gy
Reference Point Dosage Given to Date: 24 Gy
Reference Point Dosage Given to Date: 24 Gy
Reference Point Session Dosage Given: 3 Gy
Reference Point Session Dosage Given: 3 Gy
Reference Point Session Dosage Given: 3 Gy
Session Number: 8

## 2023-06-25 MED ORDER — OXYCODONE-ACETAMINOPHEN 10-325 MG PO TABS
1.0000 | ORAL_TABLET | ORAL | 0 refills | Status: DC | PRN
  Filled 2023-06-25: qty 60, 10d supply, fill #0

## 2023-06-25 MED ORDER — OXYCODONE HCL ER 15 MG PO T12A
15.0000 mg | EXTENDED_RELEASE_TABLET | Freq: Two times a day (BID) | ORAL | 0 refills | Status: DC
  Filled 2023-06-25: qty 60, 30d supply, fill #0

## 2023-06-25 MED ORDER — OXYCODONE HCL ER 10 MG PO T12A
10.0000 mg | EXTENDED_RELEASE_TABLET | Freq: Two times a day (BID) | ORAL | 0 refills | Status: DC
  Filled 2023-06-25: qty 60, 30d supply, fill #0

## 2023-06-26 ENCOUNTER — Other Ambulatory Visit (HOSPITAL_COMMUNITY): Payer: Self-pay

## 2023-06-26 ENCOUNTER — Other Ambulatory Visit: Payer: Self-pay | Admitting: Nurse Practitioner

## 2023-06-26 ENCOUNTER — Inpatient Hospital Stay: Payer: BC Managed Care – PPO | Admitting: Nurse Practitioner

## 2023-06-26 ENCOUNTER — Ambulatory Visit
Admission: RE | Admit: 2023-06-26 | Discharge: 2023-06-26 | Disposition: A | Discharge status: Home / Self Care | Payer: BC Managed Care – PPO | Source: Ambulatory Visit | Attending: Radiation Oncology | Admitting: Radiation Oncology

## 2023-06-26 ENCOUNTER — Other Ambulatory Visit: Payer: Self-pay

## 2023-06-26 DIAGNOSIS — C7951 Secondary malignant neoplasm of bone: Secondary | ICD-10-CM | POA: Diagnosis not present

## 2023-06-26 DIAGNOSIS — Z510 Encounter for antineoplastic radiation therapy: Secondary | ICD-10-CM | POA: Diagnosis not present

## 2023-06-26 DIAGNOSIS — G893 Neoplasm related pain (acute) (chronic): Secondary | ICD-10-CM

## 2023-06-26 DIAGNOSIS — C50411 Malignant neoplasm of upper-outer quadrant of right female breast: Secondary | ICD-10-CM | POA: Diagnosis not present

## 2023-06-26 DIAGNOSIS — Z515 Encounter for palliative care: Secondary | ICD-10-CM

## 2023-06-26 DIAGNOSIS — Z170 Estrogen receptor positive status [ER+]: Secondary | ICD-10-CM

## 2023-06-26 LAB — RAD ONC ARIA SESSION SUMMARY
Course Elapsed Days: 12
Plan Fractions Treated to Date: 9
Plan Fractions Treated to Date: 9
Plan Fractions Treated to Date: 9
Plan Prescribed Dose Per Fraction: 3 Gy
Plan Prescribed Dose Per Fraction: 3 Gy
Plan Prescribed Dose Per Fraction: 3 Gy
Plan Total Fractions Prescribed: 10
Plan Total Fractions Prescribed: 10
Plan Total Fractions Prescribed: 10
Plan Total Prescribed Dose: 30 Gy
Plan Total Prescribed Dose: 30 Gy
Plan Total Prescribed Dose: 30 Gy
Reference Point Dosage Given to Date: 27 Gy
Reference Point Dosage Given to Date: 27 Gy
Reference Point Dosage Given to Date: 27 Gy
Reference Point Session Dosage Given: 3 Gy
Reference Point Session Dosage Given: 3 Gy
Reference Point Session Dosage Given: 3 Gy
Session Number: 9

## 2023-06-26 MED ORDER — OXYCODONE HCL ER 10 MG PO T12A
10.0000 mg | EXTENDED_RELEASE_TABLET | Freq: Two times a day (BID) | ORAL | 0 refills | Status: DC
  Filled 2023-06-26 – 2023-07-24 (×2): qty 60, 30d supply, fill #0

## 2023-06-26 NOTE — Progress Notes (Signed)
Pt seen after radiation appointment to discuss pain regimen. Pt reports an "increase" in her OxyContin to 15mg , despite previously reporting taking 1.5 of her 10mg  OxyContin. Pt educated that it was the same dose, verbalized understanding. Pt then reported self-weaing down to 10mg  Oxycontin without informing this office. Pt reported increased use in percocet so per discussion with [name], NP pt to start 15mg  oxycontin. NP will call Monday to check in and see how she tolerates the increase in medication.

## 2023-06-27 ENCOUNTER — Other Ambulatory Visit (HOSPITAL_COMMUNITY): Payer: Self-pay

## 2023-06-27 ENCOUNTER — Ambulatory Visit
Admission: RE | Admit: 2023-06-27 | Discharge: 2023-06-27 | Disposition: A | Discharge status: Home / Self Care | Payer: BC Managed Care – PPO | Source: Ambulatory Visit | Attending: Radiation Oncology

## 2023-06-27 ENCOUNTER — Other Ambulatory Visit: Payer: Self-pay

## 2023-06-27 DIAGNOSIS — Z510 Encounter for antineoplastic radiation therapy: Secondary | ICD-10-CM | POA: Diagnosis not present

## 2023-06-27 DIAGNOSIS — C50411 Malignant neoplasm of upper-outer quadrant of right female breast: Secondary | ICD-10-CM | POA: Diagnosis not present

## 2023-06-27 DIAGNOSIS — C7951 Secondary malignant neoplasm of bone: Secondary | ICD-10-CM | POA: Diagnosis not present

## 2023-06-27 DIAGNOSIS — Z170 Estrogen receptor positive status [ER+]: Secondary | ICD-10-CM | POA: Diagnosis not present

## 2023-06-27 LAB — RAD ONC ARIA SESSION SUMMARY
Course Elapsed Days: 13
Plan Fractions Treated to Date: 10
Plan Fractions Treated to Date: 10
Plan Fractions Treated to Date: 10
Plan Prescribed Dose Per Fraction: 3 Gy
Plan Prescribed Dose Per Fraction: 3 Gy
Plan Prescribed Dose Per Fraction: 3 Gy
Plan Total Fractions Prescribed: 10
Plan Total Fractions Prescribed: 10
Plan Total Fractions Prescribed: 10
Plan Total Prescribed Dose: 30 Gy
Plan Total Prescribed Dose: 30 Gy
Plan Total Prescribed Dose: 30 Gy
Reference Point Dosage Given to Date: 30 Gy
Reference Point Dosage Given to Date: 30 Gy
Reference Point Dosage Given to Date: 30 Gy
Reference Point Session Dosage Given: 3 Gy
Reference Point Session Dosage Given: 3 Gy
Reference Point Session Dosage Given: 3 Gy
Session Number: 10

## 2023-06-28 ENCOUNTER — Other Ambulatory Visit (HOSPITAL_COMMUNITY): Payer: Self-pay

## 2023-06-28 NOTE — Radiation Completion Notes (Addendum)
  Radiation Oncology         [phone] ________________________________  Name: Doris Smith MRN: [id number]  Date of Service: 06/27/2023  DOB: [birth year]  End of Treatment Note    Diagnosis: Recurrent metastatic Stage IB, [id number], grade 2 ,ER/PR positive invasive lobular carcinoma of the right breast   Intent: Palliative     ==========DELIVERED PLANS==========  First Treatment Date: 2023-06-14 Last Treatment Date: 2023-06-27   Plan Name: Pelvis_L Site: Hip, Left Technique: 3D Mode: Photon Dose Per Fraction: 3 Gy Prescribed Dose (Delivered / Prescribed): 30 Gy / 30 Gy Prescribed Fxs (Delivered / Prescribed): 10 / 10   Plan Name: Spine_T_Rib_L Site: Thoracic Spine Technique: 3D Mode: Photon Dose Per Fraction: 3 Gy Prescribed Dose (Delivered / Prescribed): 30 Gy / 30 Gy Prescribed Fxs (Delivered / Prescribed): 10 / 10   Plan Name: Chest_Sternum Site: Chest Technique: Isodose Plan Mode: Photon Dose Per Fraction: 3 Gy Prescribed Dose (Delivered / Prescribed): 30 Gy / 30 Gy Prescribed Fxs (Delivered / Prescribed): 10 / 10     ==========ON TREATMENT VISIT DATES========== 2023-06-15, 2023-06-22   See weekly On Treatment Notes in Epic for details in the Media tab (listed as Progress notes on the On Treatment Visit Dates listed above). The patient tolerated radiation. She continued taking pain medication during therapy.  The patient will receive a call in about one month from the radiation oncology department. She will continue follow up with Dr. Al Pimple as well.      Osker Mason, PAC

## 2023-07-02 ENCOUNTER — Inpatient Hospital Stay: Payer: BC Managed Care – PPO | Admitting: Nurse Practitioner

## 2023-07-02 ENCOUNTER — Other Ambulatory Visit: Payer: Self-pay | Admitting: *Deleted

## 2023-07-02 ENCOUNTER — Telehealth: Payer: Self-pay | Admitting: *Deleted

## 2023-07-02 ENCOUNTER — Encounter: Payer: Self-pay | Admitting: Nurse Practitioner

## 2023-07-02 DIAGNOSIS — R530 Neoplastic (malignant) related fatigue: Secondary | ICD-10-CM | POA: Diagnosis not present

## 2023-07-02 DIAGNOSIS — Z515 Encounter for palliative care: Secondary | ICD-10-CM

## 2023-07-02 DIAGNOSIS — G893 Neoplasm related pain (acute) (chronic): Secondary | ICD-10-CM | POA: Diagnosis not present

## 2023-07-02 DIAGNOSIS — Z170 Estrogen receptor positive status [ER+]: Secondary | ICD-10-CM

## 2023-07-02 DIAGNOSIS — C50411 Malignant neoplasm of upper-outer quadrant of right female breast: Secondary | ICD-10-CM

## 2023-07-04 ENCOUNTER — Encounter: Payer: Self-pay | Admitting: Hematology and Oncology

## 2023-07-04 NOTE — Progress Notes (Signed)
Palliative Medicine [facility]  [phone]) [phone] [phone]) [phone]   Name: Doris Smith Date: 07/04/2023 MRN: [id number]  DOB: 09-25-1950  Patient Care Team: [name], FNP as PCP - General (Family Medicine) Jodelle Gross, NP as PCP - Cardiology (Nurse Practitioner) Ovidio Kin, MD as Consulting Physician (General Surgery) Dorothy Puffer, MD as Consulting Physician (Radiation Oncology) Lysle Pearl, MD as Consulting Physician (Pulmonary Disease) Beverely Low, MD as Consulting Physician (Orthopedic Surgery) Jene Every, MD as Consulting Physician (Orthopedic Surgery) Oneta Rack, NP as Nurse Practitioner (Adult Health Nurse Practitioner) Pershing Proud, RN as Oncology Nurse Navigator Donnelly Angelica, RN as Oncology Nurse Navigator Glenna Fellows, MD as Consulting Physician (Plastic Surgery) Rollene Rotunda, MD as Consulting Physician (Cardiology) Rachel Moulds, MD as Medical Oncologist (Hematology and Oncology) Pickenpack-Cousar, Arty Baumgartner, NP as Nurse Practitioner [facility] and Palliative Medicine)    I connected with Doris Smith on 07/02/23 at 10:00 AM EST by telephone and verified that I am speaking with the correct person using two identifiers.   I discussed the limitations, risks, security and privacy concerns of performing an evaluation and management service by telemedicine and the availability of in-person appointments. I also discussed with the patient that there may be a patient responsible charge related to this service. The patient expressed understanding and agreed to proceed.   Other persons participating in the visit and their role in the encounter: N/A   Patient's location: Home  Provider's location: [facility]   Chief Complaint: Pain Management    INTERVAL HISTORY: Doris Smith is a 72 y.o. female with oncologic medical history including estrogen receptor positive breast cancer(07/2018) s/p  right mastectomy. PET scan 10/06/22 showing widespread osseous metastasis. Other pertinent history includes atrial fibrillation, asthma, OSA, GERD, fatty liver, fibromyalgia, osteoporosis, chronic headaches, anxiety, depression, and bipolar disorder. Underwent ORIF of right hip 09/20/2022. Palliative ask to see for symptom and pain management and goals of care.   SOCIAL HISTORY:    Doris Smith reports that she has never smoked. She has never used smokeless tobacco. She reports that she does not drink alcohol and does not use drugs.  ADVANCE DIRECTIVES:  Advanced directives on file, Livia Snellen Badie, spouse, was identified as Management consultant should patient be unable to make decision for herself   CODE STATUS: Full code  PAST MEDICAL HISTORY: Past Medical History:  Diagnosis Date   A-fib (HCC) 11/2012   PAF   Anxiety    takes Ativan   Asthma 04/07/2011   dx   Bipolar disorder (HCC)    Cancer (HCC)    Right breast   Depression    Early cataracts, bilateral    Fatty liver    Fibromyalgia    GERD (gastroesophageal reflux disease)    Irritable bowel syndrome    Mental disorder    dx bipolar   PONV (postoperative nausea and vomiting)    Sleep apnea 04/2011    does not wear CPAP    ALLERGIES:  is allergic to iohexol, codeine, palonosetron, prednisone, sulfonamide derivatives, celecoxib, sulfa antibiotics, and vitamin b12.  MEDICATIONS:  Current Outpatient Medications  Medication Sig Dispense Refill   apixaban (ELIQUIS) 5 MG TABS tablet Take 1 tablet (5 mg total) by mouth 2 (two) times daily. 180 tablet 1   clotrimazole (MYCELEX) 10 MG troche Take 1 tablet (10 mg total) by mouth 5 (five) times daily. 50 Troche 0   clotrimazole-betamethasone (LOTRISONE) cream APPLY  CREAM TOPICALLY TWICE DAILY 45 g  0   esomeprazole (NEXIUM) 40 MG capsule Take 1 capsule (40 mg total) by mouth daily. 90 capsule 0   fluticasone (FLONASE) 50 MCG/ACT nasal spray Place 2 sprays into both nostrils daily. 16 g 6    furosemide (LASIX) 20 MG tablet Take 1 tablet (20 mg total) by mouth daily. 90 tablet 1   Incontinence Supply Disposable (CERTAINTY FITTED BRIEFS XL) MISC 1 Package by Does not apply route as needed. 56 each 3   lidocaine (LIDODERM) 5 % Place 2 patches onto the skin daily. Remove & Discard patch within 12 hours or as directed by MD 60 patch 2   loratadine (CLARITIN) 10 MG tablet Take 10 mg by mouth daily.     LORazepam (ATIVAN) 1 MG tablet Take 1 tablet (1 mg total) by mouth 2 (two) times daily as needed for anxiety. 60 tablet 5   magic mouthwash (nystatin, diphenhydrAMINE, alum & mag hydroxide) suspension mixture Swish and swallow 5 mLs 4 (four) times daily as needed for mouth pain. 240 mL 1   metoprolol tartrate (LOPRESSOR) 25 MG tablet Take 1 tablet (25 mg total) by mouth 2 (two) times daily. 180 tablet 1   midodrine (PROAMATINE) 2.5 MG tablet Take 1 tablet (2.5 mg total) by mouth 3 (three) times daily with meals. 270 tablet 3   nystatin (MYCOSTATIN) 100000 UNIT/ML suspension Take 5 mLs (500,000 Units total) by mouth 4 (four) times daily. 60 mL 0   ondansetron (ZOFRAN) 8 MG tablet Take 1 tablet (8 mg total) by mouth every 8 (eight) hours as needed for nausea or vomiting. 30 tablet 5   oxyCODONE (OXYCONTIN) 10 mg 12 hr tablet Take 1 tablet (10 mg total) by mouth every 12 (twelve) hours. 60 tablet 0   oxyCODONE-acetaminophen (PERCOCET) 10-325 MG tablet Take 1 tablet by mouth every 4 (four) hours as needed for pain. 60 tablet 0   palbociclib (IBRANCE) 75 MG tablet Take 1 tablet (75 mg total) by mouth daily. Take for 21 days on, 7 days off, repeat every 28 days. 21 tablet 3   polyethylene glycol (MIRALAX / GLYCOLAX) 17 g packet Take 17 g by mouth daily.     pramipexole (MIRAPEX) 1 MG tablet TAKE 1 TABLET BY MOUTH THREE TIMES DAILY 90 tablet 0   prochlorperazine (COMPAZINE) 10 MG tablet Take 1 tablet (10 mg total) by mouth every 6 (six) hours as needed for nausea or vomiting. 30 tablet 2    triamcinolone (KENALOG) 0.1 % Apply 1 application. topically 2 (two) times daily.     Zoledronic Acid (ZOMETA IV) Inject into the vein.     No current facility-administered medications for this visit.   Facility-Administered Medications Ordered in Other Visits  Medication Dose Route Frequency Provider Last Rate Last Admin   heparin lock flush 100 unit/mL  500 Units Intravenous Once Magrinat, Valentino Hue, MD       sodium chloride flush (NS) 0.9 % injection 10 mL  10 mL Intravenous PRN Magrinat, Valentino Hue, MD        VITAL SIGNS: There were no vitals taken for this visit. There were no vitals filed for this visit.    Estimated body mass index is 29.87 kg/m as calculated from the following:   Height as of 06/13/23: 5\' 4"  (1.626 m).   Weight as of 06/21/23: 174 lb (78.9 kg).   PERFORMANCE STATUS (ECOG) : 2 - Symptomatic, <50% confined to bed  Discussed the use of AI scribe software for clinical note transcription with the  patient, who gave verbal consent to proceed.   IMPRESSION:  I connected by phone with Doris Smith presents to follow-up on her pain. No acute distress identified. She denies nausea, vomiting, constipation, or diarrhea. States she is doing well overall and is much appreciative of this. Appetite is fair. Continues to take things one day at a time. Shares she is looking forward to spending some time at her son's home for the upcoming Christmas holiday.   Doris Smith states her pain is much better controlled. She was somewhat hesitant to start the Oxycodone ER 15mg  out of concern for potential side effects specifically lethargy and fatigue. The patient has been taking Oxycontin 15mg  for almost 5 days and denies any adverse reactions. Is tolerating without difficulty. No unwanted side effects. Is awake, alert, and able to engage in activities that she desires.   The patient reports that she feels a difference since starting and pain has decreased throughout the day. She has found that  she she is requiring less use of breakthrough medication during the day. Doris Smith states she is having to take breakthrough medication in conjunction with scheduled radiation due to discomfort otherwise she and husband feels much better controlled.   Discussed we will continue to closely monitor and support. She verbalized understanding and appreciation.    Goals of Care  5/23: Doris Smith is emotional. Her husband becomes emotional as patient shares she knows her health is not the best and her cancer is not curable. She is taking life one day at time. Speaks to when her time comes she will be ready but hopeful it is not now. She knows that no one has a definitive time frame. Expresses her appreciation in all of the care and support. Expresses her goal is to continue to treat the treatable allowing her the ability to continue to thrive while not suffering.   4/30 ;Since the night of 10/30/22, Doris Smith has continued to meet goal of lying in her own bed at night beside her husband. She expresses appreciation for the care of everyone at the cancer center and is hopeful for continued improvement.  We discussed Her current illness and what it means in the larger context of her on-going co-morbidities. Natural disease trajectory and expectations were discussed. We discussed the importance of continued conversation with family and their medical providers regarding overall plan of care and treatment options, ensuring decisions are within the context of the patients values and GOCs.  PLAN:  Cancer with Radiation Therapy Reports of nausea and vomiting, possibly secondary to radiation therapy. Pain is managed with --Oxycodone, as needed every 4-6 hours  -Continue current pain management regimen. -Continue Zofran for nausea and vomiting. -Consider further evaluation if symptoms persist.  Cancer Related Pain Patient tolerating Oxycontin 15mg  every 12 hours. Denies any unwanted side effects. Feels pain has  improved with increase. Requiring less use of breakthrough medication. She does require medication with her daily radiation treatments.  -Continue Oxycontin 15mg  every 12 hours  -Continue Percocet every 4 hours as needed for breakthrough pain.   Follow-up in a couple of weeks after the holidays. Patient expressed understanding and was in agreement with this plan. She also understands that she an call the clinic at any time with any questions, concerns, or complaints.   Any controlled substances utilized were prescribed in the context of palliative care. PDMP has been reviewed.    Visit consisted of counseling and education dealing with the complex and emotionally intense issues of symptom  management and palliative care in the setting of serious and potentially life-threatening illness.   Willette Alma, AGPCNP-BC  Palliative Medicine Team/[facility]

## 2023-07-05 ENCOUNTER — Other Ambulatory Visit (HOSPITAL_COMMUNITY): Payer: Self-pay

## 2023-07-06 ENCOUNTER — Encounter: Payer: Self-pay | Admitting: Hematology and Oncology

## 2023-07-06 NOTE — Telephone Encounter (Signed)
No entry 

## 2023-07-09 ENCOUNTER — Inpatient Hospital Stay: Payer: BC Managed Care – PPO

## 2023-07-09 NOTE — Progress Notes (Signed)
Nutrition Follow-up:  Patient with metastatic breast cancer.  Recently completed palliative radiation to spine, pelvis and chest.  Patient to resume ibrance every other day after completion of radiation per MD note.    Spoke with patient via phone.  Reports that she is trying to eat as best she can.  Appetite up and down.  Last night couldn't eat due to sickness on stomach.  Couldn't explain "sickness" any further.  Reports that sometimes nausea medicine does not work.  Eating cereal this am.  Drinks a boost shake daily.      Medications: reviewed  Labs: reviewed from 12/4  Anthropometrics:   Weight 174 lb on 12/12   NUTRITION DIAGNOSIS: Unintentional weight loss stable    INTERVENTION:  Continue boost plus supplements as often as able for additional calories and protein Continue high calorie, high protein foods as able to help maintain weight Patient has RD contact information and will reach out if needed    NEXT VISIT: no follow-up RD available as needed  Pearce Littlefield B. Freida Busman, RD, LDN Registered Dietitian [phone]

## 2023-07-12 ENCOUNTER — Inpatient Hospital Stay: Payer: Medicare Other

## 2023-07-12 ENCOUNTER — Inpatient Hospital Stay: Payer: Medicare Other | Admitting: Hematology and Oncology

## 2023-07-12 ENCOUNTER — Ambulatory Visit: Payer: BC Managed Care – PPO

## 2023-07-13 ENCOUNTER — Other Ambulatory Visit: Payer: Self-pay | Admitting: Nurse Practitioner

## 2023-07-13 DIAGNOSIS — G2581 Restless legs syndrome: Secondary | ICD-10-CM

## 2023-07-15 ENCOUNTER — Encounter: Payer: Self-pay | Admitting: Hematology and Oncology

## 2023-07-17 ENCOUNTER — Inpatient Hospital Stay: Payer: Medicare Other | Attending: Hematology and Oncology

## 2023-07-17 ENCOUNTER — Ambulatory Visit: Payer: BC Managed Care – PPO | Admitting: Nurse Practitioner

## 2023-07-17 ENCOUNTER — Inpatient Hospital Stay (HOSPITAL_BASED_OUTPATIENT_CLINIC_OR_DEPARTMENT_OTHER): Payer: Medicare Other | Admitting: Hematology and Oncology

## 2023-07-17 ENCOUNTER — Inpatient Hospital Stay: Payer: Medicare Other

## 2023-07-17 ENCOUNTER — Other Ambulatory Visit: Payer: Self-pay

## 2023-07-17 VITALS — BP 118/78 | HR 111 | Temp 97.6°F | Resp 17 | Wt 160.2 lb

## 2023-07-17 DIAGNOSIS — I10 Essential (primary) hypertension: Secondary | ICD-10-CM | POA: Diagnosis not present

## 2023-07-17 DIAGNOSIS — Z79899 Other long term (current) drug therapy: Secondary | ICD-10-CM | POA: Diagnosis not present

## 2023-07-17 DIAGNOSIS — F419 Anxiety disorder, unspecified: Secondary | ICD-10-CM | POA: Diagnosis not present

## 2023-07-17 DIAGNOSIS — K589 Irritable bowel syndrome without diarrhea: Secondary | ICD-10-CM | POA: Insufficient documentation

## 2023-07-17 DIAGNOSIS — E669 Obesity, unspecified: Secondary | ICD-10-CM | POA: Insufficient documentation

## 2023-07-17 DIAGNOSIS — C50411 Malignant neoplasm of upper-outer quadrant of right female breast: Secondary | ICD-10-CM | POA: Insufficient documentation

## 2023-07-17 DIAGNOSIS — K219 Gastro-esophageal reflux disease without esophagitis: Secondary | ICD-10-CM | POA: Diagnosis not present

## 2023-07-17 DIAGNOSIS — Z90710 Acquired absence of both cervix and uterus: Secondary | ICD-10-CM | POA: Insufficient documentation

## 2023-07-17 DIAGNOSIS — Z510 Encounter for antineoplastic radiation therapy: Secondary | ICD-10-CM | POA: Diagnosis not present

## 2023-07-17 DIAGNOSIS — Z1721 Progesterone receptor positive status: Secondary | ICD-10-CM | POA: Diagnosis not present

## 2023-07-17 DIAGNOSIS — Z5111 Encounter for antineoplastic chemotherapy: Secondary | ICD-10-CM | POA: Insufficient documentation

## 2023-07-17 DIAGNOSIS — I480 Paroxysmal atrial fibrillation: Secondary | ICD-10-CM | POA: Insufficient documentation

## 2023-07-17 DIAGNOSIS — Z8249 Family history of ischemic heart disease and other diseases of the circulatory system: Secondary | ICD-10-CM | POA: Diagnosis not present

## 2023-07-17 DIAGNOSIS — Z800 Family history of malignant neoplasm of digestive organs: Secondary | ICD-10-CM | POA: Diagnosis not present

## 2023-07-17 DIAGNOSIS — Z83719 Family history of colon polyps, unspecified: Secondary | ICD-10-CM | POA: Insufficient documentation

## 2023-07-17 DIAGNOSIS — F319 Bipolar disorder, unspecified: Secondary | ICD-10-CM | POA: Diagnosis not present

## 2023-07-17 DIAGNOSIS — Z803 Family history of malignant neoplasm of breast: Secondary | ICD-10-CM | POA: Insufficient documentation

## 2023-07-17 DIAGNOSIS — C7951 Secondary malignant neoplasm of bone: Secondary | ICD-10-CM | POA: Insufficient documentation

## 2023-07-17 DIAGNOSIS — G2581 Restless legs syndrome: Secondary | ICD-10-CM | POA: Insufficient documentation

## 2023-07-17 DIAGNOSIS — Z170 Estrogen receptor positive status [ER+]: Secondary | ICD-10-CM | POA: Insufficient documentation

## 2023-07-17 DIAGNOSIS — R112 Nausea with vomiting, unspecified: Secondary | ICD-10-CM | POA: Diagnosis not present

## 2023-07-17 LAB — CBC WITH DIFFERENTIAL/PLATELET
Abs Immature Granulocytes: 0.02 10*3/uL (ref 0.00–0.07)
Basophils Absolute: 0 10*3/uL (ref 0.0–0.1)
Basophils Relative: 1 %
Eosinophils Absolute: 0.2 10*3/uL (ref 0.0–0.5)
Eosinophils Relative: 3 %
HCT: 31.2 % — ABNORMAL LOW (ref 36.0–46.0)
Hemoglobin: 9.9 g/dL — ABNORMAL LOW (ref 12.0–15.0)
Immature Granulocytes: 0 %
Lymphocytes Relative: 3 %
Lymphs Abs: 0.2 10*3/uL — ABNORMAL LOW (ref 0.7–4.0)
MCH: 30.3 pg (ref 26.0–34.0)
MCHC: 31.7 g/dL (ref 30.0–36.0)
MCV: 95.4 fL (ref 80.0–100.0)
Monocytes Absolute: 0.3 10*3/uL (ref 0.1–1.0)
Monocytes Relative: 5 %
Neutro Abs: 4.9 10*3/uL (ref 1.7–7.7)
Neutrophils Relative %: 88 %
Platelets: 172 10*3/uL (ref 150–400)
RBC: 3.27 MIL/uL — ABNORMAL LOW (ref 3.87–5.11)
RDW: 16.6 % — ABNORMAL HIGH (ref 11.5–15.5)
WBC: 5.6 10*3/uL (ref 4.0–10.5)
nRBC: 0 % (ref 0.0–0.2)

## 2023-07-17 LAB — COMPREHENSIVE METABOLIC PANEL
ALT: 21 U/L (ref 0–44)
AST: 34 U/L (ref 15–41)
Albumin: 3.1 g/dL — ABNORMAL LOW (ref 3.5–5.0)
Alkaline Phosphatase: 158 U/L — ABNORMAL HIGH (ref 38–126)
Anion gap: 9 (ref 5–15)
BUN: 10 mg/dL (ref 8–23)
CO2: 29 mmol/L (ref 22–32)
Calcium: 9.2 mg/dL (ref 8.9–10.3)
Chloride: 102 mmol/L (ref 98–111)
Creatinine, Ser: 0.47 mg/dL (ref 0.44–1.00)
GFR, Estimated: >60 mL/min (ref >60)
Glucose, Bld: 134 mg/dL — ABNORMAL HIGH (ref 70–99)
Potassium: 3.7 mmol/L (ref 3.5–5.1)
Sodium: 140 mmol/L (ref 135–145)
Total Bilirubin: 0.7 mg/dL (ref 0.0–1.2)
Total Protein: 6.5 g/dL (ref 6.5–8.1)

## 2023-07-17 MED ORDER — FULVESTRANT 250 MG/5ML IM SOSY
500.0000 mg | PREFILLED_SYRINGE | Freq: Once | INTRAMUSCULAR | Status: AC
  Administered 2023-07-17: 500 mg via INTRAMUSCULAR

## 2023-07-17 MED ORDER — ESOMEPRAZOLE MAGNESIUM 40 MG PO CPDR: 40.0000 mg | DELAYED_RELEASE_CAPSULE | Freq: Every day | ORAL | 0 refills | Status: DC

## 2023-07-17 MED ORDER — ZOLEDRONIC ACID 4 MG/100ML IV SOLN
4.0000 mg | Freq: Once | INTRAVENOUS | Status: AC
  Administered 2023-07-17: 4 mg via INTRAVENOUS
  Filled 2023-07-17: qty 100

## 2023-07-17 MED ORDER — SODIUM CHLORIDE 0.9 % IV SOLN: Freq: Once | INTRAVENOUS | Status: AC

## 2023-07-17 NOTE — Progress Notes (Signed)
 [facility] Cancer Follow up:    [name], FNP [address] [place] [address]   DIAGNOSIS:  Cancer Staging  Malignant neoplasm of upper-outer quadrant of right breast in female, estrogen receptor positive (HCC) Staging form: Breast, AJCC 8th Edition - Pathologic: Stage IB (pT3, pN60mi, cM0, G2, ER+, PR+, HER2-) - Signed by Crawford Morna Pickle, NP on 10/02/2018 Multigene prognostic tests performed: MammaPrint Histologic grading system: 3 grade system - Clinical stage from 12/28/2022: Stage IV (rcT2, cN0, cM1, G3, ER+, PR+, HER2-) - Signed by Loretha Ash, MD on 12/28/2022 Stage prefix: Recurrence Histologic grading system: 3 grade system Laterality: Right Staged by: Pathologist and managing physician Stage used in treatment planning: Yes National guidelines used in treatment planning: Yes Type of national guideline used in treatment planning: NCCN   SUMMARY OF ONCOLOGIC HISTORY: Oncology History  Malignant neoplasm of upper-outer quadrant of right breast in female, estrogen receptor positive (HCC)  08/08/2018 Initial Diagnosis   status post right breast biopsy 08/02/2018 for a clinical T3 N0, stage IIA invasive lobular carcinoma, grade 2, estrogen receptor strongly positive, progesterone receptor 1% positive, with no HER-2 amplification and an MIB-1 of 1%.             (a) CT scan of the head and chest, without contrast 08/23/2018 showed nonspecific 0.4 cm left lower lobe pulmonary nodule, no definitive metastatic disease             (b) bone scan 08/23/2018 shows multiple spinal areas of abnormal uptake, but             (c) total spinal MRI 09/03/2018 finds bone scan findings to be secondary to degenerative disease, no evidence of metastatic disease.   08/2018 - 10/2022 Anti-estrogen oral therapy   Anastrozole ; discontinued 10/14/2018 in preparation for chemotherapy, resumed October 2020   09/16/2018 Surgery   Right mastectomy Jeoffrey)  [id number]): Invasive Lobular Carcinoma, 10.5 cm, grade 2, negative margins. 1 of 5 lymph nodes positive for carcinoma.   09/16/2018 Miscellaneous   MammaPrint high risk suggests a 5-year metastasis free survival of 93% with chemotherapy, with an absolute chemotherapy benefit in the greater than 12% range    09/16/2018 Miscellaneous   Caris testing on mastectomy sample (09/16/2018) showed stable MSI and proficient mismatch repair status, with a low mutational burden; BRCA 1 and 2 were not mutated, PI K3 was not mutated, ER B B2 was not mutated, and AKT 1 was not mutated. 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 Administration: 0.25 mg (11/05/2018), 0.25 mg (11/26/2018), 0.25 mg (12/17/2018), 0.25 mg (01/07/2019), 0.25 mg (01/28/2019), 0.25 mg (02/18/2019), 0.25 mg (03/11/2019), 0.25 mg (04/02/2019)  methotrexate  (PF) chemo injection 84 mg, 39.8 mg/m2 = 84.5 mg, Intravenous,  Once, 5 of 5 cycles. Administration: 84 mg (11/05/2018), 84 mg (11/26/2018), 84 mg (12/17/2018), 84 mg (03/11/2019), 84 mg (04/02/2019)  pegfilgrastim -cbqv (UDENYCA ) injection 6 mg, 6 mg, Subcutaneous, Once, 6 of 6 cycles. Administration: 6 mg (12/19/2018)  cyclophosphamide  (CYTOXAN ) 1,260 mg in sodium chloride  0.9 % 250 mL chemo infusion, 600 mg/m2 = 1,260 mg, Intravenous,  Once, 8 of 8 cycle. Administration: 1,260 mg (11/05/2018), 1,260 mg (11/26/2018), 1,260 mg (12/17/2018), 1,260 mg (01/07/2019), 1,260 mg (01/28/2019), 1,260 mg (02/18/2019), 1,260 mg (03/11/2019), 1,260 mg (04/02/2019)  fluorouracil  (ADRUCIL ) chemo injection 1,250  mg, 600 mg/m2 = 1,250 mg, Intravenous,  Once, 8 of 8 cycles. Administration: 1,250 mg (11/05/2018), 1,250 mg (11/26/2018), 1,250 mg (12/17/2018), 1,250 mg (01/07/2019), 1,250 mg (01/28/2019), 1,250 mg (02/18/2019), 1,250 mg (03/11/2019), 1,250 mg  (04/02/2019).    12/31/2018 - 02/19/2019 Radiation Therapy   The patient initially received a dose of 50.4 Gy in 28 fractions to the chest wall and supraclavicular region. This was delivered using a 3-D conformal, 4 field technique. The patient then received a boost to the mastectomy scar. This delivered an additional 10 Gy in 5 fractions using an en face electron field. The total dose was 60.4 Gy.   09/12/2022 Imaging   Bone scan on 09/12/2022 shows uptake in ribs, manubrium. Widespread osseous metastasis confirmed on PET scan that was completed on October 06, 2022. Right iliac crest biopsy demonstrated metastatic carcinoma consistent with patient's known breast carcinoma. ER 90% positive, PR 90% positive, Ki67 10%, HER2 negative.    10/10/2022 Treatment Plan Change   Faslodex  beginning 10/10/2022; Ibrance  beginning 12/20/2022; Zometa  every 12 weeks 11/07/2022    10/23/2022 - 11/03/2022 Radiation Therapy   Palliative Radiation 10/23/2022-11/03/2022:  left chest wall, lower T spine, and right proximal hip/pelvis were each treated to 30 GY in 10 fractions.      CURRENT THERAPY: Faslodex , Ibrance , Xgeva   INTERVAL HISTORY:  Doris Smith 73 y.o. female returns for follow-up and evaluation with her sister and brother in law. Discussed the use of AI scribe software for clinical note transcription with the patient, who gave verbal consent to proceed  The patient, diagnosed with stage 4 breast cancer with bone metastasis, presents for follow up.  The patient, with a history of advanced breast cancer, presents with ongoing pain and nausea. Despite being on pain and nausea medications, she reports that her symptoms are not consistently controlled. She describes her pain as constant, with some days being worse than others. She also notes that her pain seems to increase with activity. Her nausea is described as unpredictable, with episodes of dry heaves and vomiting. She reports that her nausea seems to be worse in  the mornings and can be triggered by the sight or smell of food. The patient also notes that she has been sleeping more since her pain medication was increased to 15mg . She expresses a desire to reduce her daytime dosage to avoid excessive sleepiness.  The patient has been on Ibrance  and Faslodex  for her cancer treatment. She started taking Ibrance  every other day for three weeks on, one week off, since the end of December. This regimen was adopted due to the adverse effects of daily dosage on her blood work. She also receives Faslodex  injections, which she reports have not caused any problems. Additionally, she receives a bone strengthener (Zmax) every three months.   Patient Active Problem List   Diagnosis Date Noted   Peripheral edema 12/29/2022   Restless leg syndrome 10/03/2021   Anxiety associated with depression 06/10/2020   Port-A-Cath in place 11/12/2018   Malignant neoplasm of upper-outer quadrant of right breast in female, estrogen receptor positive (HCC) 08/08/2018   Right knee pain 03/01/2015   Aortic atherosclerosis (HCC) 12/28/2014   Obesity (BMI 30-39.9) 04/09/2014   Insomnia due to stress 04/09/2014   Atrial fibrillation (HCC) 10/01/2013   GERD (gastroesophageal reflux disease) 10/01/2013   Bipolar disorder (HCC) 09/27/2007   Essential hypertension 09/27/2007   ALLERGIC RHINITIS 09/27/2007   IRRITABLE BOWEL SYNDROME 09/27/2007   Arthropathy 09/27/2007   DEGENERATIVE DISC DISEASE, LUMBOSACRAL SPINE  09/27/2007    is allergic to iohexol, codeine, palonosetron , prednisone , sulfonamide derivatives, celecoxib, sulfa antibiotics, and vitamin b12.  MEDICAL HISTORY: Past Medical History:  Diagnosis Date   A-fib (HCC) 11/2012   PAF   Anxiety    takes Ativan    Asthma 04/07/2011   dx   Bipolar disorder (HCC)    Cancer (HCC)    Right breast   Depression    Early cataracts, bilateral    Fatty liver    Fibromyalgia    GERD (gastroesophageal reflux disease)    Irritable  bowel syndrome    Mental disorder    dx bipolar   PONV (postoperative nausea and vomiting)    Sleep apnea 04/2011    does not wear CPAP    SURGICAL HISTORY: Past Surgical History:  Procedure Laterality Date   ABDOMINAL HYSTERECTOMY     COLONOSCOPY     DILATION AND CURETTAGE OF UTERUS  1981   abnormal pap   KNEE ARTHROSCOPY Left 2007   x 2   LUMBAR LAMINECTOMY/DECOMPRESSION MICRODISCECTOMY  05/31/2011   Procedure: LUMBAR LAMINECTOMY/DECOMPRESSION MICRODISCECTOMY;  Surgeon: Reyes JAYSON Billing;  Location: [facility];  Service: Orthopedics;  Laterality: Left;  Decompression Lumbar four to five and  Lumbar five to Sacral one on Left  (X-Ray)   MASTECTOMY W/ SENTINEL NODE BIOPSY Right 09/16/2018   Procedure: RIGHT MASTECTOMY WITH RIGHT AXILLARY SENTINEL LYMPH NODE BIOPSY;  Surgeon: Ethyl Lenis, MD;  Location: [facility] OR;  Service: General;  Laterality: Right;   PARTIAL HYSTERECTOMY  1982   PORTACATH PLACEMENT Left 10/31/2018   Procedure: INSERTION PORT-A-CATH WITH ULTRASOUND;  Surgeon: Ethyl Lenis, MD;  Location: [facility];  Service: General;  Laterality: Left;   TONSILLECTOMY     as child   TOTAL KNEE ARTHROPLASTY Left 12/18/2005    SOCIAL HISTORY: Social History   Socioeconomic History   Marital status: Married    Spouse name: Lynwood   Number of children: 2   Years of education: 12   Highest education level: Not on file  Occupational History   Occupation: Disabled  Tobacco Use   Smoking status: Never   Smokeless tobacco: Never  Vaping Use   Vaping status: Never Used  Substance and Sexual Activity   Alcohol use: No   Drug use: No   Sexual activity: Yes    Birth control/protection: Post-menopausal, Surgical  Other Topics Concern   Not on file  Social History Narrative   Tea daily.  Rarely has caffeine    Social Drivers of Corporate Investment Banker Strain: Low Risk  (08/18/2022)   Overall Financial Resource Strain (CARDIA)    Difficulty of Paying Living  Expenses: Not hard at all  Food Insecurity: Low Risk  (09/25/2022)   [facility]   Hunger Vital Sign    Worried About Running Out of Food in the Last Year: Never true    Ran Out of Food in the Last Year: Never true  Transportation Needs: No Transportation Needs (10/04/2022)   [facility]   PRAPARE - Transportation    Lack of Transportation (Medical): No    Lack of Transportation (Non-Medical): No  Physical Activity: Inactive (08/18/2022)   Exercise Vital Sign    Days of Exercise per Week: 0 days    Minutes of Exercise per Session: 0 min  Stress: No Stress Concern Present (08/18/2022)   [employer] of Occupational Health - Occupational Stress Questionnaire    Feeling of Stress :  Not at all  Social Connections: Moderately Integrated (08/18/2022)   Social Connection and Isolation Panel [NHANES]    Frequency of Communication with Friends and Family: More than three times a week    Frequency of Social Gatherings with Friends and Family: More than three times a week    Attends Religious Services: More than 4 times per year    Active Member of [employer] or Organizations: No    Attends Banker Meetings: Never    Marital Status: Married  Catering Manager Violence: Not At Risk (08/18/2022)   Humiliation, Afraid, Rape, and Kick questionnaire    Fear of Current or Ex-Partner: No    Emotionally Abused: No    Physically Abused: No    Sexually Abused: No    FAMILY HISTORY: Family History  Problem Relation Age of Onset   Anesthesia problems Mother    Heart disease Mother        CHF, atrial fib   Heart failure Mother    Anesthesia problems Sister    Colon polyps Father    Heart disease Father        Fluid around the heart   Hypertension Sister    Breast cancer Maternal Aunt    Colon cancer Maternal Aunt    Prostate cancer Neg Hx    Ovarian cancer Neg Hx     Review of Systems  Constitutional:  Positive for  fatigue. Negative for appetite change, chills, fever and unexpected weight change.  HENT:   Negative for hearing loss, lump/mass and trouble swallowing.   Eyes:  Negative for eye problems and icterus.  Respiratory:  Negative for chest tightness, cough and shortness of breath.   Cardiovascular:  Negative for chest pain, leg swelling and palpitations.  Gastrointestinal:  Positive for nausea. Negative for abdominal distention, abdominal pain, constipation, diarrhea and vomiting.  Endocrine: Negative for hot flashes.  Genitourinary:  Negative for difficulty urinating.   Musculoskeletal:  Positive for back pain. Negative for arthralgias.  Skin:  Negative for itching and rash.  Neurological:  Negative for dizziness, extremity weakness, headaches and numbness.  Hematological:  Negative for adenopathy. Does not bruise/bleed easily.  Psychiatric/Behavioral:  Negative for depression. The patient is not nervous/anxious.       PHYSICAL EXAMINATION     Vitals:   07/17/23 1302  BP: 118/78  Pulse: (!) 111  Resp: 17  Temp: 97.6 F (36.4 C)  SpO2: 99%     General appearance: ongoing tardive dyskinesia movements like lip smacking No other acute distress No regional adenopathy Ant lung fields clear No LE swelling.  LABORATORY DATA:  CBC    Component Value Date/Time   WBC 5.6 07/17/2023 1221   RBC 3.27 (L) 07/17/2023 1221   HGB 9.9 (L) 07/17/2023 1221   HGB 8.7 (L) 06/04/2023 1506   HGB 12.0 06/05/2022 1024   HCT 31.2 (L) 07/17/2023 1221   HCT 37.4 06/05/2022 1024   PLT 172 07/17/2023 1221   PLT 154 06/04/2023 1506   PLT 251 06/05/2022 1024   MCV 95.4 07/17/2023 1221   MCV 86 06/05/2022 1024   MCH 30.3 07/17/2023 1221   MCHC 31.7 07/17/2023 1221   RDW 16.6 (H) 07/17/2023 1221   RDW 15.5 (H) 06/05/2022 1024   LYMPHSABS 0.2 (L) 07/17/2023 1221   LYMPHSABS 0.9 06/05/2022 1024   MONOABS 0.3 07/17/2023 1221   EOSABS 0.2 07/17/2023 1221   EOSABS 0.1 06/05/2022 1024   BASOSABS  0.0 07/17/2023 1221   BASOSABS 0.1 06/05/2022 1024  CMP     Component Value Date/Time   NA 140 07/17/2023 1221   NA 145 (H) 06/05/2022 1024   K 3.7 07/17/2023 1221   CL 102 07/17/2023 1221   CO2 29 07/17/2023 1221   GLUCOSE 134 (H) 07/17/2023 1221   BUN 10 07/17/2023 1221   BUN 17 06/05/2022 1024   CREATININE 0.47 07/17/2023 1221   CREATININE 0.54 06/04/2023 1506   CALCIUM 9.2 07/17/2023 1221   PROT 6.5 07/17/2023 1221   PROT 6.6 06/05/2022 1024   ALBUMIN 3.1 (L) 07/17/2023 1221   ALBUMIN 4.0 06/05/2022 1024   AST 34 07/17/2023 1221   AST 32 06/04/2023 1506   ALT 21 07/17/2023 1221   ALT 16 06/04/2023 1506   ALKPHOS 158 (H) 07/17/2023 1221   BILITOT 0.7 07/17/2023 1221   BILITOT 0.8 06/04/2023 1506   GFRNONAA >60 07/17/2023 1221   GFRNONAA >60 06/04/2023 1506   GFRAA 81 02/03/2020 1049   GFRAA >60 12/19/2018 1434     ASSESSMENT and THERAPY PLAN:   Malignant neoplasm of upper-outer quadrant of right breast in female, estrogen receptor positive (HCC) Doris Smith is a 73 year-old woman with metastatic breast cancer here today for follow-up and evaluation  Metastatic Breast Cancer Patient is on Ibrance  every other day for three weeks on, one week off due to previous intolerance to daily dosing. Also receiving Faslodex  injections without issue. -Continue Ibrance  and Faslodex  as prescribed. -Repeat scans in 1-1.5 months to assess response to treatment.  Bone Metastases Patient has received Zometa  for bone strengthening, due for next dose. -Administer Zometa  today, no new dental concerns.  Chronic Pain Patient reports constant pain, some days worse than others. Currently on 15mg  pain medication, but experiencing excessive sleepiness. -Communicate with pain management specialist about adjusting pain medication dosage (possibly lower dose during the day, higher dose at night).  Nausea and Poor Appetite Patient reports nausea and poor appetite, possibly related to pain  medication and anxiety. -Consider taking nausea medication half an hour before meals. -Continue current nausea medications, can increase dose if needed.  General Health Maintenance -Continue monitoring blood counts due to Ibrance . -Follow-up in one month.   We discussed goals of care once again. She is relatively frail hence may not be able to tolerate aggressive treatments. She understands that there may be several options for treatment available but these may not be apt for her. When it comes to that point, she understands she is best served by staying comfortable.  All questions were answered. The patient knows to call the clinic with any problems, questions or concerns. We can certainly see the patient much sooner if necessary.  Total encounter time:40 minutes*in face-to-face visit time, chart review, lab review, care coordination, order entry, and documentation of the encounter time.   *Total Encounter Time as defined by the Centers for Medicare and Medicaid Services includes, in addition to the face-to-face time of a patient visit (documented in the note above) non-face-to-face time: obtaining and reviewing outside history, ordering and reviewing medications, tests or procedures, care coordination (communications with other health care professionals or caregivers) and documentation in the medical record.

## 2023-07-17 NOTE — Telephone Encounter (Signed)
 Pt seen in infusion and c/o nausea and vomiting and indigestion. Recommended to eat with medications and restart daily Nexium  as well as alternate between Zofran  and compazine . Pt and sister (at chairside) understood and had no further questions. Refill of Nexium  sent in.

## 2023-07-17 NOTE — Patient Instructions (Addendum)
 - pick up Nexium  and start taking every morning - continue your pain medications as prescribed - try to eat some food with your medications to prevent nausea and vomiting - take zofran  every 8 hours as needed and alternated with your compazine  every 6 hours as needed for nausea and vomiting.  - call the office at [phone] with any questions or concerns   Zoledronic  Acid Injection (Cancer) What is this medication? ZOLEDRONIC  ACID (ZOE le dron ik AS id) treats high calcium levels in the blood caused by cancer. It may also be used with chemotherapy to treat weakened bones caused by cancer. It works by slowing down the release of calcium from bones. This lowers calcium levels in your blood. It also makes your bones stronger and less likely to break (fracture). It belongs to a group of medications called bisphosphonates. This medicine may be used for other purposes; ask your health care provider or pharmacist if you have questions. COMMON BRAND NAME(S): Zometa , Zometa  Powder What should I tell my care team before I take this medication? They need to know if you have any of these conditions: Dehydration Dental disease Kidney disease Liver disease Low levels of calcium in the blood Lung or breathing disease, such as asthma Receiving steroids, such as dexamethasone  or prednisone  An unusual or allergic reaction to zoledronic  acid, other medications, foods, dyes, or preservatives Pregnant or trying to get pregnant Breast-feeding How should I use this medication? This medication is injected into a vein. It is given by your care team in a hospital or clinic setting. Talk to your care team about the use of this medication in children. Special care may be needed. Overdosage: If you think you have taken too much of this medicine contact a poison control center or emergency room at once. NOTE: This medicine is only for you. Do not share this medicine with others. What if I miss a dose? Keep  appointments for follow-up doses. It is important not to miss your dose. Call your care team if you are unable to keep an appointment. What may interact with this medication? Certain antibiotics given by injection Diuretics, such as bumetanide, furosemide  NSAIDs, medications for pain and inflammation, such as ibuprofen  or naproxen Teriparatide Thalidomide This list may not describe all possible interactions. Give your health care provider a list of all the medicines, herbs, non-prescription drugs, or dietary supplements you use. Also tell them if you smoke, drink alcohol, or use illegal drugs. Some items may interact with your medicine. What should I watch for while using this medication? Visit your care team for regular checks on your progress. It may be some time before you see the benefit from this medication. Some people who take this medication have severe bone, joint, or muscle pain. This medication may also increase your risk for jaw problems or a broken thigh bone. Tell your care team right away if you have severe pain in your jaw, bones, joints, or muscles. Tell you care team if you have any pain that does not go away or that gets worse. Tell your dentist and dental surgeon that you are taking this medication. You should not have major dental surgery while on this medication. See your dentist to have a dental exam and fix any dental problems before starting this medication. Take good care of your teeth while on this medication. Make sure you see your dentist for regular follow-up appointments. You should make sure you get enough calcium and vitamin D  while you are taking  this medication. Discuss the foods you eat and the vitamins you take with your care team. Check with your care team if you have severe diarrhea, nausea, and vomiting, or if you sweat a lot. The loss of too much body fluid may make it dangerous for you to take this medication. You may need bloodwork while taking this  medication. Talk to your care team if you wish to become pregnant or think you might be pregnant. This medication can cause serious birth defects. What side effects may I notice from receiving this medication? Side effects that you should report to your care team as soon as possible: Allergic reactions--skin rash, itching, hives, swelling of the face, lips, tongue, or throat Kidney injury--decrease in the amount of urine, swelling of the ankles, hands, or feet Low calcium level--muscle pain or cramps, confusion, tingling, or numbness in the hands or feet Osteonecrosis of the jaw--pain, swelling, or redness in the mouth, numbness of the jaw, poor healing after dental work, unusual discharge from the mouth, visible bones in the mouth Severe bone, joint, or muscle pain Side effects that usually do not require medical attention (report to your care team if they continue or are bothersome): Constipation Fatigue Fever Loss of appetite Nausea Stomach pain This list may not describe all possible side effects. Call your doctor for medical advice about side effects. You may report side effects to FDA at 1-800-FDA-1088. Where should I keep my medication? This medication is given in a hospital or clinic. It will not be stored at home. NOTE: This sheet is a summary. It may not cover all possible information. If you have questions about this medicine, talk to your doctor, pharmacist, or health care provider.  2024 Elsevier/Gold Standard (2021-08-19 00:00:00)

## 2023-07-17 NOTE — Assessment & Plan Note (Addendum)
 Doris Smith is a 73 year-old woman with metastatic breast cancer here today for follow-up and evaluation  Metastatic Breast Cancer Patient is on Ibrance  every other day for three weeks on, one week off due to previous intolerance to daily dosing. Also receiving Faslodex  injections without issue. -Continue Ibrance  and Faslodex  as prescribed. -Repeat scans in 1-1.5 months to assess response to treatment.  Bone Metastases Patient has received Zometa  for bone strengthening, due for next dose. -Administer Zometa  today, no new dental concerns.  Chronic Pain Patient reports constant pain, some days worse than others. Currently on 15mg  pain medication, but experiencing excessive sleepiness. -Communicate with pain management specialist about adjusting pain medication dosage (possibly lower dose during the day, higher dose at night).  Nausea and Poor Appetite Patient reports nausea and poor appetite, possibly related to pain medication and anxiety. -Consider taking nausea medication half an hour before meals. -Continue current nausea medications, can increase dose if needed.  General Health Maintenance -Continue monitoring blood counts due to Ibrance . -Follow-up in one month.

## 2023-07-18 LAB — CANCER ANTIGEN 27.29: CA 27.29: 1319.5 U/mL — ABNORMAL HIGH (ref 0.0–38.6)

## 2023-07-24 ENCOUNTER — Other Ambulatory Visit (HOSPITAL_COMMUNITY): Payer: Self-pay

## 2023-07-24 ENCOUNTER — Other Ambulatory Visit: Payer: Self-pay | Admitting: Nurse Practitioner

## 2023-07-24 DIAGNOSIS — C50411 Malignant neoplasm of upper-outer quadrant of right female breast: Secondary | ICD-10-CM

## 2023-07-24 DIAGNOSIS — F312 Bipolar disorder, current episode manic severe with psychotic features: Secondary | ICD-10-CM | POA: Diagnosis not present

## 2023-07-24 DIAGNOSIS — Z170 Estrogen receptor positive status [ER+]: Secondary | ICD-10-CM

## 2023-07-24 DIAGNOSIS — G893 Neoplasm related pain (acute) (chronic): Secondary | ICD-10-CM

## 2023-07-24 DIAGNOSIS — Z515 Encounter for palliative care: Secondary | ICD-10-CM

## 2023-07-24 DIAGNOSIS — F410 Panic disorder [episodic paroxysmal anxiety] without agoraphobia: Secondary | ICD-10-CM | POA: Diagnosis not present

## 2023-07-24 DIAGNOSIS — F4310 Post-traumatic stress disorder, unspecified: Secondary | ICD-10-CM | POA: Diagnosis not present

## 2023-07-24 MED ORDER — OXYCODONE HCL ER 15 MG PO T12A
15.0000 mg | EXTENDED_RELEASE_TABLET | Freq: Two times a day (BID) | ORAL | 0 refills | Status: DC
  Filled 2023-07-24: qty 60, 30d supply, fill #0

## 2023-07-24 NOTE — Progress Notes (Deleted)
 Palliative Medicine [facility]  Telephone:(336) [phone] Fax:(336) [phone]   Name: Doris Smith Date: 07/24/2023 MRN: [id number]  DOB: March 17, 1951  Patient Care Team: Delfina Feller, FNP as PCP - General (Family Medicine) Tania Familia, NP as PCP - Cardiology (Nurse Practitioner) Juanita Norlander, MD as Consulting Physician (General Surgery) Johna Myers, MD as Consulting Physician (Radiation Oncology) Stephania Eglin, MD as Consulting Physician (Pulmonary Disease) Winston Hawking, MD as Consulting Physician (Orthopedic Surgery) Orvan Blanch, MD as Consulting Physician (Orthopedic Surgery) Elenora Griffiths, NP as Nurse Practitioner (Adult Health Nurse Practitioner) Auther Bo, RN as Oncology Nurse Navigator Alane Hsu, RN as Oncology Nurse Navigator Alger Infield, MD as Consulting Physician (Plastic Surgery) Eilleen Grates, MD as Consulting Physician (Cardiology) Murleen Arms, MD as Medical Oncologist (Hematology and Oncology) Pickenpack-Cousar, Giles Labrum, NP as Nurse Practitioner [facility] and Palliative Medicine)    I connected with Doris Smith on 07/25/2023 at  1:30 PM EST by telephone and verified that I am speaking with the correct person using two identifiers.   I discussed the limitations, risks, security and privacy concerns of performing an evaluation and management service by telemedicine and the availability of in-person appointments. I also discussed with the patient that there may be a patient responsible charge related to this service. The patient expressed understanding and agreed to proceed.   Other persons participating in the visit and their role in the encounter: N/A   Patient's location: Home  Provider's location: [facility]   Chief Complaint: Pain Management    INTERVAL HISTORY: Doris Smith is a 73 y.o. female with oncologic medical history including estrogen receptor positive breast cancer(07/2018) s/p  right mastectomy. PET scan 10/06/22 showing widespread osseous metastasis. Other pertinent history includes atrial fibrillation, asthma, OSA, GERD, fatty liver, fibromyalgia, osteoporosis, chronic headaches, anxiety, depression, and bipolar disorder. Underwent ORIF of right hip 09/20/2022. Palliative ask to see for symptom and pain management and goals of care.   SOCIAL HISTORY:    Doris Smith reports that she has never smoked. She has never used smokeless tobacco. She reports that she does not drink alcohol and does not use drugs.  ADVANCE DIRECTIVES:  Advanced directives on file, Doris Smith, spouse, was identified as Management consultant should patient be unable to make decision for herself   CODE STATUS: Full code  PAST MEDICAL HISTORY: Past Medical History:  Diagnosis Date   A-fib (HCC) 11/2012   PAF   Anxiety    takes Ativan    Asthma 04/07/2011   dx   Bipolar disorder (HCC)    Cancer (HCC)    Right breast   Depression    Early cataracts, bilateral    Fatty liver    Fibromyalgia    GERD (gastroesophageal reflux disease)    Irritable bowel syndrome    Mental disorder    dx bipolar   PONV (postoperative nausea and vomiting)    Sleep apnea 04/2011    does not wear CPAP    ALLERGIES:  is allergic to iohexol, codeine, palonosetron , prednisone , sulfonamide derivatives, celecoxib, sulfa antibiotics, and vitamin b12.  MEDICATIONS:  Current Outpatient Medications  Medication Sig Dispense Refill   apixaban  (ELIQUIS ) 5 MG TABS tablet Take 1 tablet (5 mg total) by mouth 2 (two) times daily. 180 tablet 1   clotrimazole  (MYCELEX ) 10 MG troche Take 1 tablet (10 mg total) by mouth 5 (five) times daily. 50 Troche 0   clotrimazole -betamethasone  (LOTRISONE ) cream APPLY  CREAM TOPICALLY TWICE DAILY 45  g 0   esomeprazole  (NEXIUM ) 40 MG capsule Take 1 capsule (40 mg total) by mouth daily. 90 capsule 0   fluticasone  (FLONASE ) 50 MCG/ACT nasal spray Place 2 sprays into both nostrils daily. 16 g 6    furosemide  (LASIX ) 20 MG tablet Take 1 tablet (20 mg total) by mouth daily. 90 tablet 1   Incontinence Supply Disposable (CERTAINTY FITTED BRIEFS XL) MISC 1 Package by Does not apply route as needed. 56 each 3   lidocaine  (LIDODERM ) 5 % Place 2 patches onto the skin daily. Remove & Discard patch within 12 hours or as directed by MD 60 patch 2   loratadine  (CLARITIN ) 10 MG tablet Take 10 mg by mouth daily.     LORazepam  (ATIVAN ) 1 MG tablet Take 1 tablet (1 mg total) by mouth 2 (two) times daily as needed for anxiety. 60 tablet 5   magic mouthwash (nystatin , diphenhydrAMINE , alum & mag hydroxide) suspension mixture Swish and swallow 5 mLs 4 (four) times daily as needed for mouth pain. 240 mL 1   metoprolol  tartrate (LOPRESSOR ) 25 MG tablet Take 1 tablet (25 mg total) by mouth 2 (two) times daily. 180 tablet 1   midodrine  (PROAMATINE ) 2.5 MG tablet Take 1 tablet (2.5 mg total) by mouth 3 (three) times daily with meals. 270 tablet 3   nystatin  (MYCOSTATIN ) 100000 UNIT/ML suspension Take 5 mLs (500,000 Units total) by mouth 4 (four) times daily. 60 mL 0   ondansetron  (ZOFRAN ) 8 MG tablet Take 1 tablet (8 mg total) by mouth every 8 (eight) hours as needed for nausea or vomiting. 30 tablet 5   oxyCODONE  (OXYCONTIN ) 15 mg 12 hr tablet Take 1 tablet (15 mg total) by mouth every 12 (twelve) hours. 60 tablet 0   oxyCODONE -acetaminophen  (PERCOCET) 10-325 MG tablet Take 1 tablet by mouth every 4 (four) hours as needed for pain. 60 tablet 0   palbociclib  (IBRANCE ) 75 MG tablet Take 1 tablet (75 mg total) by mouth daily. Take for 21 days on, 7 days off, repeat every 28 days. 21 tablet 3   polyethylene glycol (MIRALAX / GLYCOLAX) 17 g packet Take 17 g by mouth daily.     pramipexole  (MIRAPEX ) 1 MG tablet TAKE 1 TABLET BY MOUTH THREE TIMES DAILY 90 tablet 0   prochlorperazine  (COMPAZINE ) 10 MG tablet Take 1 tablet (10 mg total) by mouth every 6 (six) hours as needed for nausea or vomiting. 30 tablet 2    triamcinolone  (KENALOG) 0.1 % Apply 1 application. topically 2 (two) times daily.     Zoledronic  Acid (ZOMETA  IV) Inject into the vein.     No current facility-administered medications for this visit.   Facility-Administered Medications Ordered in Other Visits  Medication Dose Route Frequency Provider Last Rate Last Admin   heparin  lock flush 100 unit/mL  500 Units Intravenous Once Magrinat, Gustav C, MD       sodium chloride  flush (NS) 0.9 % injection 10 mL  10 mL Intravenous PRN Magrinat, Rozella Cornfield, MD        VITAL SIGNS: There were no vitals taken for this visit. There were no vitals filed for this visit.    Estimated body mass index is 27.5 kg/m as calculated from the following:   Height as of 06/13/23: 5\' 4"  (1.626 m).   Weight as of 07/17/23: 160 lb 3.2 oz (72.7 kg).   PERFORMANCE STATUS (ECOG) : 2 - Symptomatic, <50% confined to bed  Discussed the use of AI scribe software for clinical note  transcription with the patient, who gave verbal consent to proceed.   IMPRESSION:  I connected by phone with Mrs. Neitzke presents to follow-up on her pain. No acute distress identified. She denies nausea, vomiting, constipation, or diarrhea. States she is doing well overall and is much appreciative of this. Appetite is fair. Continues to take things one day at a time. Shares she is looking forward to spending some time at her son's home for the upcoming Christmas holiday.   Mrs. Feider states her pain is much better controlled. She was somewhat hesitant to start the Oxycodone  ER 15mg  out of concern for potential side effects specifically lethargy and fatigue. The patient has been taking Oxycontin  15mg  for almost 5 days and denies any adverse reactions. Is tolerating without difficulty. No unwanted side effects. Is awake, alert, and able to engage in activities that she desires.   The patient reports that she feels a difference since starting and pain has decreased throughout the day. She has found  that she she is requiring less use of breakthrough medication during the day. Mrs. Gazda states she is having to take breakthrough medication in conjunction with scheduled radiation due to discomfort otherwise she and husband feels much better controlled.   Discussed we will continue to closely monitor and support. She verbalized understanding and appreciation.    Goals of Care  5/23: Mrs. Learn is emotional. Her husband becomes emotional as patient shares she knows her health is not the best and her cancer is not curable. She is taking life one day at time. Speaks to when her time comes she will be ready but hopeful it is not now. She knows that no one has a definitive time frame. Expresses her appreciation in all of the care and support. Expresses her goal is to continue to treat the treatable allowing her the ability to continue to thrive while not suffering.   4/30 ;Since the night of 10/30/22, Mrs. Grabert has continued to meet goal of lying in her own bed at night beside her husband. She expresses appreciation for the care of everyone at the cancer center and is hopeful for continued improvement.  We discussed Her current illness and what it means in the larger context of her on-going co-morbidities. Natural disease trajectory and expectations were discussed. We discussed the importance of continued conversation with family and their medical providers regarding overall plan of care and treatment options, ensuring decisions are within the context of the patients values and GOCs.  PLAN:  Cancer with Radiation Therapy Reports of nausea and vomiting, possibly secondary to radiation therapy. Pain is managed with --Oxycodone , as needed every 4-6 hours  -Continue current pain management regimen. -Continue Zofran  for nausea and vomiting. -Consider further evaluation if symptoms persist.  Cancer Related Pain Patient tolerating Oxycontin  15mg  every 12 hours. Denies any unwanted side effects. Feels pain has  improved with increase. Requiring less use of breakthrough medication. She does require medication with her daily radiation treatments.  -Continue Oxycontin  15mg  every 12 hours  -Continue Percocet every 4 hours as needed for breakthrough pain.   Follow-up in a couple of weeks after the holidays. Patient expressed understanding and was in agreement with this plan. She also understands that she an call the clinic at any time with any questions, concerns, or complaints.   Any controlled substances utilized were prescribed in the context of palliative care. PDMP has been reviewed.    Visit consisted of counseling and education dealing with the complex and emotionally intense  issues of symptom management and palliative care in the setting of serious and potentially life-threatening illness.   Dellia Ferguson, AGPCNP-BC  Palliative Medicine Team/[facility]

## 2023-07-25 ENCOUNTER — Inpatient Hospital Stay: Payer: Medicare Other | Admitting: Nurse Practitioner

## 2023-07-25 ENCOUNTER — Encounter: Payer: Self-pay | Admitting: Nurse Practitioner

## 2023-07-26 ENCOUNTER — Other Ambulatory Visit (HOSPITAL_COMMUNITY): Payer: Self-pay

## 2023-07-26 ENCOUNTER — Encounter: Payer: Self-pay | Admitting: Hematology and Oncology

## 2023-07-30 ENCOUNTER — Ambulatory Visit
Admission: RE | Admit: 2023-07-30 | Discharge: 2023-07-30 | Disposition: A | Discharge status: Home / Self Care | Payer: Medicare Other | Source: Ambulatory Visit | Attending: Internal Medicine | Admitting: Internal Medicine

## 2023-07-30 NOTE — Progress Notes (Signed)
  Radiation Oncology         [phone]) [phone] ________________________________  Name: Doris Smith MRN: [id number]  Date of Service: 07/30/2023  DOB: Feb 18, 1951  Post Treatment Telephone Note  Diagnosis:  Recurrent metastatic Stage IB, [id number], grade 2 ,ER/PR positive invasive lobular carcinoma of the right breast (as documented in provider EOT note)  The patient was available for call today.  The patient did  note fatigue during radiation. The patient did  note skin changes in the field of radiation during therapy. The patient has noticed improvement in pain in the area(s) treated with radiation. The patient is not taking dexamethasone. The patient does not have symptoms of  weakness or loss of control of the extremities. The patient does not have symptoms of headache. The patient does not have symptoms of seizure or uncontrolled movement. The patient does not have symptoms of changes in vision. The patient does not have changes in speech. The patient does not have confusion.   The patient is scheduled for ongoing care with Dr. Al Pimple in medical oncology. The patient was encouraged to call if she develops concerns or questions regarding radiation.   This concludes the interaction.  [name], LPN

## 2023-08-02 ENCOUNTER — Other Ambulatory Visit: Payer: Self-pay

## 2023-08-02 DIAGNOSIS — R110 Nausea: Secondary | ICD-10-CM

## 2023-08-02 DIAGNOSIS — Z515 Encounter for palliative care: Secondary | ICD-10-CM

## 2023-08-02 DIAGNOSIS — C50411 Malignant neoplasm of upper-outer quadrant of right female breast: Secondary | ICD-10-CM

## 2023-08-02 DIAGNOSIS — G893 Neoplasm related pain (acute) (chronic): Secondary | ICD-10-CM

## 2023-08-02 MED ORDER — PROCHLORPERAZINE MALEATE 10 MG PO TABS: 10.0000 mg | ORAL_TABLET | Freq: Four times a day (QID) | ORAL | 2 refills | Status: DC | PRN

## 2023-08-02 NOTE — Telephone Encounter (Signed)
Patient called for a medication refill, see associated orders. Patient also review schedule, all questions answered, no further needs at this time.

## 2023-08-10 ENCOUNTER — Other Ambulatory Visit: Payer: Self-pay | Admitting: Nurse Practitioner

## 2023-08-10 DIAGNOSIS — G2581 Restless legs syndrome: Secondary | ICD-10-CM

## 2023-08-10 NOTE — Progress Notes (Deleted)
 Palliative Medicine [facility]  Telephone:(336) [phone] Fax:(336) [phone]   Name: Doris Smith Date: 08/10/2023 MRN: [id number]  DOB: 11-25-1950  Patient Care Team: [name], FNP as PCP - General (Family Medicine) Jerilynn Lamarr HERO, NP as PCP - Cardiology (Nurse Practitioner) Ethyl Lenis, MD as Consulting Physician (General Surgery) Dewey Rush, MD as Consulting Physician (Radiation Oncology) Laymon Soulier, MD as Consulting Physician (Pulmonary Disease) Kay Kemps, MD as Consulting Physician (Orthopedic Surgery) Duwayne Purchase, MD as Consulting Physician (Orthopedic Surgery) Kallie Medici, NP as Nurse Practitioner (Adult Health Nurse Practitioner) Glean Stephane BROCKS, RN as Oncology Nurse Navigator Tyree Nanetta SAILOR, RN as Oncology Nurse Navigator Arelia Filippo, MD as Consulting Physician (Plastic Surgery) Lavona Agent, MD as Consulting Physician (Cardiology) Loretha Ash, MD as Medical Oncologist (Hematology and Oncology) Pickenpack-Cousar, Fannie SAILOR, NP as Nurse Practitioner [facility] and Palliative Medicine)    I connected with Doris Smith on 07/25/2023 at  2:00 PM EST by telephone and verified that I am speaking with the correct person using two identifiers.   I discussed the limitations, risks, security and privacy concerns of performing an evaluation and management service by telemedicine and the availability of in-person appointments. I also discussed with the patient that there may be a patient responsible charge related to this service. The patient expressed understanding and agreed to proceed.   Other persons participating in the visit and their role in the encounter: N/A   Patient's location: Home  Provider's location: [facility]   Chief Complaint: Pain Management    INTERVAL HISTORY: Doris Smith is a 73 y.o. female with oncologic medical history including estrogen receptor positive breast cancer(07/2018) s/p  right mastectomy. PET scan 10/06/22 showing widespread osseous metastasis. Other pertinent history includes atrial fibrillation, asthma, OSA, GERD, fatty liver, fibromyalgia, osteoporosis, chronic headaches, anxiety, depression, and bipolar disorder. Underwent ORIF of right hip 09/20/2022. Palliative ask to see for symptom and pain management and goals of care.   SOCIAL HISTORY:    Doris Smith reports that she has never smoked. She has never used smokeless tobacco. She reports that she does not drink alcohol and does not use drugs.  ADVANCE DIRECTIVES:  Advanced directives on file, Doris Smith, spouse, was identified as management consultant should patient be unable to make decision for herself   CODE STATUS: Full code  PAST MEDICAL HISTORY: Past Medical History:  Diagnosis Date   A-fib (HCC) 11/2012   PAF   Anxiety    takes Ativan    Asthma 04/07/2011   dx   Bipolar disorder (HCC)    Cancer (HCC)    Right breast   Depression    Early cataracts, bilateral    Fatty liver    Fibromyalgia    GERD (gastroesophageal reflux disease)    Irritable bowel syndrome    Mental disorder    dx bipolar   PONV (postoperative nausea and vomiting)    Sleep apnea 04/2011    does not wear CPAP    ALLERGIES:  is allergic to iohexol, codeine, palonosetron , prednisone , sulfonamide derivatives, celecoxib, sulfa antibiotics, and vitamin b12.  MEDICATIONS:  Current Outpatient Medications  Medication Sig Dispense Refill   apixaban  (ELIQUIS ) 5 MG TABS tablet Take 1 tablet (5 mg total) by mouth 2 (two) times daily. 180 tablet 1   clotrimazole  (MYCELEX ) 10 MG troche Take 1 tablet (10 mg total) by mouth 5 (five) times daily. 50 Troche 0   clotrimazole -betamethasone  (LOTRISONE ) cream APPLY  CREAM TOPICALLY TWICE DAILY 45  g 0   esomeprazole  (NEXIUM ) 40 MG capsule Take 1 capsule (40 mg total) by mouth daily. 90 capsule 0   fluticasone  (FLONASE ) 50 MCG/ACT nasal spray Place 2 sprays into both nostrils daily. 16 g 6    furosemide  (LASIX ) 20 MG tablet Take 1 tablet (20 mg total) by mouth daily. 90 tablet 1   Incontinence Supply Disposable (CERTAINTY FITTED BRIEFS XL) MISC 1 Package by Does not apply route as needed. 56 each 3   lidocaine  (LIDODERM ) 5 % Place 2 patches onto the skin daily. Remove & Discard patch within 12 hours or as directed by MD 60 patch 2   loratadine  (CLARITIN ) 10 MG tablet Take 10 mg by mouth daily.     LORazepam  (ATIVAN ) 1 MG tablet Take 1 tablet (1 mg total) by mouth 2 (two) times daily as needed for anxiety. 60 tablet 5   magic mouthwash (nystatin , diphenhydrAMINE , alum & mag hydroxide) suspension mixture Swish and swallow 5 mLs 4 (four) times daily as needed for mouth pain. 240 mL 1   metoprolol  tartrate (LOPRESSOR ) 25 MG tablet Take 1 tablet (25 mg total) by mouth 2 (two) times daily. 180 tablet 1   midodrine  (PROAMATINE ) 2.5 MG tablet Take 1 tablet (2.5 mg total) by mouth 3 (three) times daily with meals. 270 tablet 3   nystatin  (MYCOSTATIN ) 100000 UNIT/ML suspension Take 5 mLs (500,000 Units total) by mouth 4 (four) times daily. 60 mL 0   ondansetron  (ZOFRAN ) 8 MG tablet Take 1 tablet (8 mg total) by mouth Smith 8 (eight) hours as needed for nausea or vomiting. 30 tablet 5   oxyCODONE  (OXYCONTIN ) 15 mg 12 hr tablet Take 1 tablet (15 mg total) by mouth Smith 12 (twelve) hours. 60 tablet 0   oxyCODONE -acetaminophen  (PERCOCET) 10-325 MG tablet Take 1 tablet by mouth Smith 4 (four) hours as needed for pain. 60 tablet 0   palbociclib  (IBRANCE ) 75 MG tablet Take 1 tablet (75 mg total) by mouth daily. Take for 21 days on, 7 days off, repeat Smith 28 days. 21 tablet 3   polyethylene glycol (MIRALAX / GLYCOLAX) 17 g packet Take 17 g by mouth daily.     pramipexole  (MIRAPEX ) 1 MG tablet TAKE 1 TABLET BY MOUTH THREE TIMES DAILY 90 tablet 0   prochlorperazine  (COMPAZINE ) 10 MG tablet Take 1 tablet (10 mg total) by mouth Smith 6 (six) hours as needed for nausea or vomiting. 30 tablet 2    triamcinolone  (KENALOG) 0.1 % Apply 1 application. topically 2 (two) times daily.     Zoledronic  Acid (ZOMETA  IV) Inject into the vein.     No current facility-administered medications for this visit.   Facility-Administered Medications Ordered in Other Visits  Medication Dose Route Frequency Provider Last Rate Last Admin   heparin  lock flush 100 unit/mL  500 Units Intravenous Once Magrinat, Gustav C, MD       sodium chloride  flush (NS) 0.9 % injection 10 mL  10 mL Intravenous PRN Magrinat, Sandria BROCKS, MD        VITAL SIGNS: There were no vitals taken for this visit. There were no vitals filed for this visit.    Estimated body mass index is 27.5 kg/m as calculated from the following:   Height as of 06/13/23: 5' 4 (1.626 m).   Weight as of 07/17/23: 160 lb 3.2 oz (72.7 kg).   PERFORMANCE STATUS (ECOG) : 2 - Symptomatic, <50% confined to bed  Discussed the use of AI scribe software for clinical note  transcription with the patient, who gave verbal consent to proceed.   IMPRESSION:  I connected by phone with Mrs. Roderick presents to follow-up on her pain. No acute distress identified. She denies nausea, vomiting, constipation, or diarrhea. States she is doing well overall and is much appreciative of this. Appetite is fair. Continues to take things one day at a time. Shares she is looking forward to spending some time at her son's home for the upcoming Christmas holiday.   Mrs. Theall states her pain is much better controlled. She was somewhat hesitant to start the Oxycodone  ER 15mg  out of concern for potential side effects specifically lethargy and fatigue. The patient has been taking Oxycontin  15mg  for almost 5 days and denies any adverse reactions. Is tolerating without difficulty. No unwanted side effects. Is awake, alert, and able to engage in activities that she desires.   The patient reports that she feels a difference since starting and pain has decreased throughout the day. She has found  that she she is requiring less use of breakthrough medication during the day. Mrs. Michie states she is having to take breakthrough medication in conjunction with scheduled radiation due to discomfort otherwise she and husband feels much better controlled.   Discussed we will continue to closely monitor and support. She verbalized understanding and appreciation.    Goals of Care  5/23: Mrs. Asfour is emotional. Her husband becomes emotional as patient shares she knows her health is not the best and her cancer is not curable. She is taking life one day at time. Speaks to when her time comes she will be ready but hopeful it is not now. She knows that no one has a definitive time frame. Expresses her appreciation in all of the care and support. Expresses her goal is to continue to treat the treatable allowing her the ability to continue to thrive while not suffering.   4/30 ;Since the night of 10/30/22, Mrs. Balcerzak has continued to meet goal of lying in her own bed at night beside her husband. She expresses appreciation for the care of everyone at the cancer center and is hopeful for continued improvement.  We discussed Her current illness and what it means in the larger context of her on-going co-morbidities. Natural disease trajectory and expectations were discussed. We discussed the importance of continued conversation with family and their medical providers regarding overall plan of care and treatment options, ensuring decisions are within the context of the patients values and GOCs.  PLAN:  Cancer with Radiation Therapy Reports of nausea and vomiting, possibly secondary to radiation therapy. Pain is managed with --Oxycodone , as needed Smith 4-6 hours  -Continue current pain management regimen. -Continue Zofran  for nausea and vomiting. -Consider further evaluation if symptoms persist.  Cancer Related Pain Patient tolerating Oxycontin  15mg  Smith 12 hours. Denies any unwanted side effects. Feels pain has  improved with increase. Requiring less use of breakthrough medication. She does require medication with her daily radiation treatments.  -Continue Oxycontin  15mg  Smith 12 hours  -Continue Percocet Smith 4 hours as needed for breakthrough pain.   Follow-up in a couple of weeks after the holidays. Patient expressed understanding and was in agreement with this plan. She also understands that she an call the clinic at any time with any questions, concerns, or complaints.   Any controlled substances utilized were prescribed in the context of palliative care. PDMP has been reviewed.    Visit consisted of counseling and education dealing with the complex and emotionally intense  issues of symptom management and palliative care in the setting of serious and potentially life-threatening illness.   Levon Borer, AGPCNP-BC  Palliative Medicine Team/[facility]

## 2023-08-14 ENCOUNTER — Inpatient Hospital Stay: Payer: Medicare Other

## 2023-08-14 ENCOUNTER — Inpatient Hospital Stay (HOSPITAL_BASED_OUTPATIENT_CLINIC_OR_DEPARTMENT_OTHER): Payer: Medicare Other | Admitting: Hematology and Oncology

## 2023-08-14 ENCOUNTER — Encounter: Payer: Self-pay | Admitting: Hematology and Oncology

## 2023-08-14 ENCOUNTER — Inpatient Hospital Stay: Payer: Medicare Other | Admitting: Nurse Practitioner

## 2023-08-14 ENCOUNTER — Inpatient Hospital Stay: Payer: Medicare Other | Attending: Hematology and Oncology

## 2023-08-14 VITALS — BP 115/42 | HR 111 | Temp 98.0°F | Resp 18 | Wt 157.1 lb

## 2023-08-14 DIAGNOSIS — C50411 Malignant neoplasm of upper-outer quadrant of right female breast: Secondary | ICD-10-CM

## 2023-08-14 DIAGNOSIS — C7951 Secondary malignant neoplasm of bone: Secondary | ICD-10-CM | POA: Insufficient documentation

## 2023-08-14 DIAGNOSIS — K31819 Angiodysplasia of stomach and duodenum without bleeding: Secondary | ICD-10-CM | POA: Insufficient documentation

## 2023-08-14 DIAGNOSIS — D649 Anemia, unspecified: Secondary | ICD-10-CM | POA: Insufficient documentation

## 2023-08-14 DIAGNOSIS — Z170 Estrogen receptor positive status [ER+]: Secondary | ICD-10-CM | POA: Insufficient documentation

## 2023-08-14 DIAGNOSIS — Z79899 Other long term (current) drug therapy: Secondary | ICD-10-CM | POA: Insufficient documentation

## 2023-08-14 LAB — CBC WITH DIFFERENTIAL/PLATELET
Abs Immature Granulocytes: 0.01 10*3/uL (ref 0.00–0.07)
Basophils Absolute: 0 10*3/uL (ref 0.0–0.1)
Basophils Relative: 0 %
Eosinophils Absolute: 0 10*3/uL (ref 0.0–0.5)
Eosinophils Relative: 1 %
HCT: 19.4 % — ABNORMAL LOW (ref 36.0–46.0)
Hemoglobin: 6 g/dL — CL (ref 12.0–15.0)
Immature Granulocytes: 0 %
Lymphocytes Relative: 6 %
Lymphs Abs: 0.2 10*3/uL — ABNORMAL LOW (ref 0.7–4.0)
MCH: 32.6 pg (ref 26.0–34.0)
MCHC: 30.9 g/dL (ref 30.0–36.0)
MCV: 105.4 fL — ABNORMAL HIGH (ref 80.0–100.0)
Monocytes Absolute: 0.2 10*3/uL (ref 0.1–1.0)
Monocytes Relative: 6 %
Neutro Abs: 2.3 10*3/uL (ref 1.7–7.7)
Neutrophils Relative %: 87 %
Platelets: 160 10*3/uL (ref 150–400)
RBC: 1.84 MIL/uL — ABNORMAL LOW (ref 3.87–5.11)
RDW: 19.4 % — ABNORMAL HIGH (ref 11.5–15.5)
WBC: 2.7 10*3/uL — ABNORMAL LOW (ref 4.0–10.5)
nRBC: 0.7 % — ABNORMAL HIGH (ref 0.0–0.2)

## 2023-08-14 LAB — COMPREHENSIVE METABOLIC PANEL
ALT: 16 U/L (ref 0–44)
AST: 31 U/L (ref 15–41)
Albumin: 3 g/dL — ABNORMAL LOW (ref 3.5–5.0)
Alkaline Phosphatase: 215 U/L — ABNORMAL HIGH (ref 38–126)
Anion gap: 5 (ref 5–15)
BUN: 9 mg/dL (ref 8–23)
CO2: 29 mmol/L (ref 22–32)
Calcium: 8.6 mg/dL — ABNORMAL LOW (ref 8.9–10.3)
Chloride: 105 mmol/L (ref 98–111)
Creatinine, Ser: 0.54 mg/dL (ref 0.44–1.00)
GFR, Estimated: >60 mL/min (ref >60)
Glucose, Bld: 168 mg/dL — ABNORMAL HIGH (ref 70–99)
Potassium: 3.5 mmol/L (ref 3.5–5.1)
Sodium: 139 mmol/L (ref 135–145)
Total Bilirubin: 0.5 mg/dL (ref 0.0–1.2)
Total Protein: 5.4 g/dL — ABNORMAL LOW (ref 6.5–8.1)

## 2023-08-14 LAB — SAMPLE TO BLOOD BANK

## 2023-08-14 MED ORDER — FULVESTRANT 250 MG/5ML IM SOSY
500.0000 mg | PREFILLED_SYRINGE | Freq: Once | INTRAMUSCULAR | Status: AC
Start: 2023-08-14 — End: 2023-08-14
  Administered 2023-08-14: 500 mg via INTRAMUSCULAR
  Filled 2023-08-14: qty 10

## 2023-08-14 NOTE — Assessment & Plan Note (Signed)
Doris Smith is a 73 year-old woman with metastatic breast cancer here today for follow-up and evaluation  Metastatic Breast Cancer Patient is on Ibrance every other day for three weeks on, one week off due to previous intolerance to daily dosing. Also receiving Faslodex injections without issue. She is tolerating the combination well however today's labs showed severe anemia with hemoglobin of 6 g/dL, marked drop from the last labs drawn 4 weeks ago.  Hence we have asked her to hold Ibrance.  She absolutely refuses any GI bleeding or black stool.  She is also asymptomatic from the severe anemia, denies any worsening fatigue, chest pain, chest pressure or shortness of breath. We have arranged for 2 units of packed red blood cell transfusion, unfortunately the soonest we can transfuse her outpatient is on Friday.  Given the fact that she is clinically asymptomatic, I think this is reasonable to wait however strict instructions were given to take her to the nearest emergency room if she develops any symptoms.  Patient, husband and sister expressed understanding.  She can proceed with Faslodex today as scheduled.  Bone Metastases Patient has received Zometa for bone strengthening, last dose in January.  She gets this every 12 weeks.  Chronic Pain For the first, her pain is well-controlled.  I believe the palliative radiation she has received in December has worked tremendously well for her.  Nausea and Poor Appetite Again this has markedly improved likely because of better control of pain.  Follow-up on Friday for blood transfusion and labs and 2 weeks with me

## 2023-08-14 NOTE — Progress Notes (Signed)
[facility] Cancer Follow up:    Doris Pierini, FNP [address] [place] [address]   DIAGNOSIS:  Cancer Staging  Malignant neoplasm of upper-outer quadrant of right breast in female, estrogen receptor positive (HCC) Staging form: Breast, AJCC 8th Edition - Pathologic: Stage IB (pT3, pN20mi, cM0, G2, ER+, PR+, HER2-) - Signed by Loa Socks, NP on 10/02/2018 Multigene prognostic tests performed: MammaPrint Histologic grading system: 3 grade system - Clinical stage from 12/28/2022: Stage IV (rcT2, cN0, cM1, G3, ER+, PR+, HER2-) - Signed by Rachel Moulds, MD on 12/28/2022 Stage prefix: Recurrence Histologic grading system: 3 grade system Laterality: Right Staged by: Pathologist and managing physician Stage used in treatment planning: Yes National guidelines used in treatment planning: Yes Type of national guideline used in treatment planning: NCCN   SUMMARY OF ONCOLOGIC HISTORY: Oncology History  Malignant neoplasm of upper-outer quadrant of right breast in female, estrogen receptor positive (HCC)  08/08/2018 Initial Diagnosis   status post right breast biopsy 08/02/2018 for a clinical T3 N0, stage IIA invasive lobular carcinoma, grade 2, estrogen receptor strongly positive, progesterone receptor 1% positive, with no HER-2 amplification and an MIB-1 of 1%.             (a) CT scan of the head and chest, without contrast 08/23/2018 showed nonspecific 0.4 cm left lower lobe pulmonary nodule, no definitive metastatic disease             (b) bone scan 08/23/2018 shows multiple spinal areas of abnormal uptake, but             (c) total spinal MRI 09/03/2018 finds bone scan findings to be secondary to degenerative disease, no evidence of metastatic disease.   08/2018 - 10/2022 Anti-estrogen oral therapy   Anastrozole; discontinued 10/14/2018 in preparation for chemotherapy, resumed October 2020   09/16/2018 Surgery   Right mastectomy [name]  [phone]): Invasive Lobular Carcinoma, 10.5 cm, grade 2, negative margins. 1 of 5 lymph nodes positive for carcinoma.   09/16/2018 Miscellaneous   MammaPrint "high risk" suggests a 5-year metastasis free survival of 93% with chemotherapy, with an absolute chemotherapy benefit in the greater than 12% range    09/16/2018 Miscellaneous   Caris testing on mastectomy sample (09/16/2018) showed stable MSI and proficient mismatch repair status, with a low mutational burden; BRCA 1 and 2 were not mutated, PI K3 was not mutated, ER B B2 was not mutated, and AKT 1 was not mutated. 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 Administration: 0.25 mg (11/05/2018), 0.25 mg (11/26/2018), 0.25 mg (12/17/2018), 0.25 mg (01/07/2019), 0.25 mg (01/28/2019), 0.25 mg (02/18/2019), 0.25 mg (03/11/2019), 0.25 mg (04/02/2019)  methotrexate (PF) chemo injection 84 mg, 39.8 mg/m2 = 84.5 mg, Intravenous,  Once, 5 of 5 cycles. Administration: 84 mg (11/05/2018), 84 mg (11/26/2018), 84 mg (12/17/2018), 84 mg (03/11/2019), 84 mg (04/02/2019)  pegfilgrastim-cbqv (UDENYCA) injection 6 mg, 6 mg, Subcutaneous, Once, 6 of 6 cycles. Administration: 6 mg (12/19/2018)  cyclophosphamide (CYTOXAN) 1,260 mg in sodium chloride 0.9 % 250 mL chemo infusion, 600 mg/m2 = 1,260 mg, Intravenous,  Once, 8 of 8 cycle. Administration: 1,260 mg (11/05/2018), 1,260 mg (11/26/2018), 1,260 mg (12/17/2018), 1,260 mg (01/07/2019), 1,260 mg (01/28/2019), 1,260 mg (02/18/2019), 1,260 mg (03/11/2019), 1,260 mg (04/02/2019)  fluorouracil (ADRUCIL) chemo injection 1,250  mg, 600 mg/m2 = 1,250 mg, Intravenous,  Once, 8 of 8 cycles. Administration: 1,250 mg (11/05/2018), 1,250 mg (11/26/2018), 1,250 mg (12/17/2018), 1,250 mg (01/07/2019), 1,250 mg (01/28/2019), 1,250 mg (02/18/2019), 1,250 mg (03/11/2019), 1,250 mg  (04/02/2019).    12/31/2018 - 02/19/2019 Radiation Therapy   The patient initially received a dose of 50.4 Gy in 28 fractions to the chest wall and supraclavicular region. This was delivered using a 3-D conformal, 4 field technique. The patient then received a boost to the mastectomy scar. This delivered an additional 10 Gy in 5 fractions using an en face electron field. The total dose was 60.4 Gy.   09/12/2022 Imaging   Bone scan on 09/12/2022 shows uptake in ribs, manubrium. Widespread osseous metastasis confirmed on PET scan that was completed on October 06, 2022. Right iliac crest biopsy demonstrated metastatic carcinoma consistent with patient's known breast carcinoma. ER 90% positive, PR 90% positive, Ki67 10%, HER2 negative.    10/10/2022 Treatment Plan Change   Faslodex beginning 10/10/2022; Ibrance beginning 12/20/2022; Zometa every 12 weeks 11/07/2022    10/23/2022 - 11/03/2022 Radiation Therapy   Palliative Radiation 10/23/2022-11/03/2022:  left chest wall, lower T spine, and right proximal hip/pelvis were each treated to 30 GY in 10 fractions.      CURRENT THERAPY: Faslodex, Mora Appl  INTERVAL HISTORY:  Doris Smith 73 y.o. female returns for follow-up and evaluation with her sister and brother in law. Discussed the use of AI scribe software for clinical note transcription with the patient, who gave verbal consent to proceed  The patient, diagnosed with stage 4 breast cancer with bone metastasis, presents for follow up.  The patient, with a history of advanced breast cancer, presents for follow up. Since her last visit, she completed radiation to the left pelvis, Thoracic spine and sternum, completed radiation in December 2024.  Her pain is definitely better controlled than any prior visit.  She has more appetite and has been eating better.  She is now on week 2 of Ibrance, currently she is on 75 mg and takes it every other day had severe pancytopenia with 75 mg daily.  She reports  nothing out of ordinary from symptom standpoint.  She has some fatigue but not anything more than baseline.  She does not report any worsening shortness of breath, lightheadedness, chest pain or chest pressure.  She actually feels well in a long time.  Rest of the pertinent 10 point ROS reviewed and negative  Patient Active Problem List   Diagnosis Date Noted   Peripheral edema 12/29/2022   Restless leg syndrome 10/03/2021   Anxiety associated with depression 06/10/2020   Port-A-Cath in place 11/12/2018   Malignant neoplasm of upper-outer quadrant of right breast in female, estrogen receptor positive (HCC) 08/08/2018   Right knee pain 03/01/2015   Aortic atherosclerosis (HCC) 12/28/2014   Obesity (BMI 30-39.9) 04/09/2014   Insomnia due to stress 04/09/2014   Atrial fibrillation (HCC) 10/01/2013   GERD (gastroesophageal reflux disease) 10/01/2013   Bipolar disorder (HCC) 09/27/2007   Essential hypertension 09/27/2007   ALLERGIC RHINITIS 09/27/2007   IRRITABLE BOWEL SYNDROME 09/27/2007   Arthropathy 09/27/2007   DEGENERATIVE DISC DISEASE, LUMBOSACRAL SPINE 09/27/2007    is allergic to iohexol, codeine, palonosetron, prednisone, sulfonamide derivatives, celecoxib, sulfa antibiotics, and vitamin b12.  MEDICAL HISTORY: Past Medical History:  Diagnosis Date   A-fib (HCC) 11/2012   PAF   Anxiety    takes Ativan   Asthma 04/07/2011   dx   Bipolar disorder (  HCC)    Cancer (HCC)    Right breast   Depression    Early cataracts, bilateral    Fatty liver    Fibromyalgia    GERD (gastroesophageal reflux disease)    Irritable bowel syndrome    Mental disorder    dx bipolar   PONV (postoperative nausea and vomiting)    Sleep apnea 04/2011    does not wear CPAP    SURGICAL HISTORY: Past Surgical History:  Procedure Laterality Date   ABDOMINAL HYSTERECTOMY     COLONOSCOPY     DILATION AND CURETTAGE OF UTERUS  1981   abnormal pap   KNEE ARTHROSCOPY Left 2007   x 2   LUMBAR  LAMINECTOMY/DECOMPRESSION MICRODISCECTOMY  05/31/2011   Procedure: LUMBAR LAMINECTOMY/DECOMPRESSION MICRODISCECTOMY;  Surgeon: Javier Docker;  Location: [facility];  Service: Orthopedics;  Laterality: Left;  Decompression Lumbar four to five and  Lumbar five to Sacral one on Left  (X-Ray)   MASTECTOMY W/ SENTINEL NODE BIOPSY Right 09/16/2018   Procedure: RIGHT MASTECTOMY WITH RIGHT AXILLARY SENTINEL LYMPH NODE BIOPSY;  Surgeon: Ovidio Kin, MD;  Location: [facility] OR;  Service: General;  Laterality: Right;   PARTIAL HYSTERECTOMY  1982   PORTACATH PLACEMENT Left 10/31/2018   Procedure: INSERTION PORT-A-CATH WITH ULTRASOUND;  Surgeon: Ovidio Kin, MD;  Location: [facility];  Service: General;  Laterality: Left;   TONSILLECTOMY     as child   TOTAL KNEE ARTHROPLASTY Left 12/18/2005    SOCIAL HISTORY: Social History   Socioeconomic History   Marital status: Married    Spouse name: [name]   Number of children: 2   Years of education: 12   Highest education level: Not on file  Occupational History   Occupation: Disabled  Tobacco Use   Smoking status: Never   Smokeless tobacco: Never  Vaping Use   Vaping status: Never Used  Substance and Sexual Activity   Alcohol use: No   Drug use: No   Sexual activity: Yes    Birth control/protection: Post-menopausal, Surgical  Other Topics Concern   Not on file  Social History Narrative   Tea daily.  Rarely has caffeine    Social Drivers of Corporate investment banker Strain: Low Risk  (08/18/2022)   Overall Financial Resource Strain (CARDIA)    Difficulty of Paying Living Expenses: Not hard at all  Food Insecurity: Low Risk  (09/25/2022)   [facility]   Hunger Vital Sign    Worried About Running Out of Food in the Last Year: Never true    Ran Out of Food in the Last Year: Never true  Transportation Needs: No Transportation Needs (10/04/2022)   [facility]   PRAPARE -  Transportation    Lack of Transportation (Medical): No    Lack of Transportation (Non-Medical): No  Physical Activity: Inactive (08/18/2022)   Exercise Vital Sign    Days of Exercise per Week: 0 days    Minutes of Exercise per Session: 0 min  Stress: No Stress Concern Present (08/18/2022)   [employer] of Occupational Health - Occupational Stress Questionnaire    Feeling of Stress : Not at all  Social Connections: Moderately Integrated (08/18/2022)   Social Connection and Isolation Panel [NHANES]    Frequency of Communication with Friends and Family: More than three times a week    Frequency of Social Gatherings with Friends and Family: More than three times a week    Attends  Religious Services: More than 4 times per year    Active Member of Clubs or Organizations: No    Attends Banker Meetings: Never    Marital Status: Married  Catering manager Violence: Not At Risk (08/18/2022)   Humiliation, Afraid, Rape, and Kick questionnaire    Fear of Current or Ex-Partner: No    Emotionally Abused: No    Physically Abused: No    Sexually Abused: No    FAMILY HISTORY: Family History  Problem Relation Age of Onset   Anesthesia problems Mother    Heart disease Mother        CHF, atrial fib   Heart failure Mother    Anesthesia problems Sister    Colon polyps Father    Heart disease Father        "Fluid around the heart"   Hypertension Sister    Breast cancer Maternal Aunt    Colon cancer Maternal Aunt    Prostate cancer Neg Hx    Ovarian cancer Neg Hx     Review of Systems  Constitutional:  Positive for fatigue. Negative for appetite change, chills, fever and unexpected weight change.  HENT:   Negative for hearing loss, lump/mass and trouble swallowing.   Eyes:  Negative for eye problems and icterus.  Respiratory:  Negative for chest tightness, cough and shortness of breath.   Cardiovascular:  Negative for chest pain, leg swelling and palpitations.  Gastrointestinal:   Negative for abdominal distention, abdominal pain, constipation, diarrhea, nausea and vomiting.  Endocrine: Negative for hot flashes.  Genitourinary:  Negative for difficulty urinating.   Musculoskeletal:  Negative for arthralgias and back pain.  Skin:  Negative for itching and rash.  Neurological:  Negative for dizziness, extremity weakness, headaches and numbness.  Hematological:  Negative for adenopathy. Does not bruise/bleed easily.  Psychiatric/Behavioral:  Negative for depression. The patient is not nervous/anxious.       PHYSICAL EXAMINATION     Vitals:   08/14/23 1251  BP: (!) 115/42  Pulse: (!) 111  Resp: 18  Temp: 98 F (36.7 C)  SpO2: 99%     General appearance: ongoing tardive dyskinesia movements like lip smacking, very well controlled today No other acute distress No regional adenopathy  LABORATORY DATA:  CBC    Component Value Date/Time   WBC 2.7 (L) 08/14/2023 1202   RBC 1.84 (L) 08/14/2023 1202   HGB 6.0 (LL) 08/14/2023 1202   HGB 8.7 (L) 06/04/2023 1506   HGB 12.0 06/05/2022 1024   HCT 19.4 (L) 08/14/2023 1202   HCT 37.4 06/05/2022 1024   PLT 160 08/14/2023 1202   PLT 154 06/04/2023 1506   PLT 251 06/05/2022 1024   MCV 105.4 (H) 08/14/2023 1202   MCV 86 06/05/2022 1024   MCH 32.6 08/14/2023 1202   MCHC 30.9 08/14/2023 1202   RDW 19.4 (H) 08/14/2023 1202   RDW 15.5 (H) 06/05/2022 1024   LYMPHSABS 0.2 (L) 08/14/2023 1202   LYMPHSABS 0.9 06/05/2022 1024   MONOABS 0.2 08/14/2023 1202   EOSABS 0.0 08/14/2023 1202   EOSABS 0.1 06/05/2022 1024   BASOSABS 0.0 08/14/2023 1202   BASOSABS 0.1 06/05/2022 1024    CMP     Component Value Date/Time   NA 139 08/14/2023 1202   NA 145 (H) 06/05/2022 1024   K 3.5 08/14/2023 1202   CL 105 08/14/2023 1202   CO2 29 08/14/2023 1202   GLUCOSE 168 (H) 08/14/2023 1202   BUN 9 08/14/2023 1202   BUN  17 06/05/2022 1024   CREATININE 0.54 08/14/2023 1202   CREATININE 0.54 06/04/2023 1506   CALCIUM 8.6 (L)  08/14/2023 1202   PROT 5.4 (L) 08/14/2023 1202   PROT 6.6 06/05/2022 1024   ALBUMIN 3.0 (L) 08/14/2023 1202   ALBUMIN 4.0 06/05/2022 1024   AST 31 08/14/2023 1202   AST 32 06/04/2023 1506   ALT 16 08/14/2023 1202   ALT 16 06/04/2023 1506   ALKPHOS 215 (H) 08/14/2023 1202   BILITOT 0.5 08/14/2023 1202   BILITOT 0.8 06/04/2023 1506   GFRNONAA >60 08/14/2023 1202   GFRNONAA >60 06/04/2023 1506   GFRAA 81 02/03/2020 1049   GFRAA >60 12/19/2018 1434     ASSESSMENT and THERAPY PLAN:   Malignant neoplasm of upper-outer quadrant of right breast in female, estrogen receptor positive (HCC) [name] is a 73 year-old woman with metastatic breast cancer here today for follow-up and evaluation  Metastatic Breast Cancer Patient is on Ibrance every other day for three weeks on, one week off due to previous intolerance to daily dosing. Also receiving Faslodex injections without issue. She is tolerating the combination well however today's labs showed severe anemia with hemoglobin of 6 g/dL, marked drop from the last labs drawn 4 weeks ago.  Hence we have asked her to hold Ibrance.  She absolutely refuses any GI bleeding or black stool.  She is also asymptomatic from the severe anemia, denies any worsening fatigue, chest pain, chest pressure or shortness of breath. We have arranged for 2 units of packed red blood cell transfusion, unfortunately the soonest we can transfuse her outpatient is on Friday.  Given the fact that she is clinically asymptomatic, I think this is reasonable to wait however strict instructions were given to take her to the nearest emergency room if she develops any symptoms.  Patient, husband and sister expressed understanding.  She can proceed with Faslodex today as scheduled.  Bone Metastases Patient has received Zometa for bone strengthening, last dose in January.  She gets this every 12 weeks.  Chronic Pain For the first, her pain is well-controlled.  I believe the palliative  radiation she has received in December has worked tremendously well for her.  Nausea and Poor Appetite Again this has markedly improved likely because of better control of pain.  Follow-up on Friday for blood transfusion and labs and 2 weeks with me   All questions were answered. The patient knows to call the clinic with any problems, questions or concerns. We can certainly see the patient much sooner if necessary.  Total encounter time:30 minutes*in face-to-face visit time, chart review, lab review, care coordination, order entry, and documentation of the encounter time.   *Total Encounter Time as defined by the Centers for Medicare and Medicaid Services includes, in addition to the face-to-face time of a patient visit (documented in the note above) non-face-to-face time: obtaining and reviewing outside history, ordering and reviewing medications, tests or procedures, care coordination (communications with other health care professionals or caregivers) and documentation in the medical record.

## 2023-08-15 ENCOUNTER — Encounter: Payer: Self-pay | Admitting: Hematology and Oncology

## 2023-08-15 LAB — CANCER ANTIGEN 27.29: CA 27.29: 841.2 U/mL — ABNORMAL HIGH (ref 0.0–38.6)

## 2023-08-16 ENCOUNTER — Encounter (HOSPITAL_COMMUNITY): Payer: Self-pay

## 2023-08-16 ENCOUNTER — Other Ambulatory Visit: Payer: Self-pay

## 2023-08-16 ENCOUNTER — Inpatient Hospital Stay (HOSPITAL_COMMUNITY)
Admission: EM | Admit: 2023-08-16 | Discharge: 2023-08-19 | DRG: 377 | Disposition: A | Discharge status: Home / Self Care | Payer: Medicare Other | Attending: Internal Medicine | Admitting: Internal Medicine

## 2023-08-16 ENCOUNTER — Ambulatory Visit: Payer: Self-pay | Admitting: Nurse Practitioner

## 2023-08-16 ENCOUNTER — Telehealth: Payer: Self-pay

## 2023-08-16 DIAGNOSIS — K31811 Angiodysplasia of stomach and duodenum with bleeding: Secondary | ICD-10-CM | POA: Diagnosis not present

## 2023-08-16 DIAGNOSIS — E440 Moderate protein-calorie malnutrition: Secondary | ICD-10-CM | POA: Diagnosis not present

## 2023-08-16 DIAGNOSIS — D649 Anemia, unspecified: Secondary | ICD-10-CM | POA: Diagnosis not present

## 2023-08-16 DIAGNOSIS — Z515 Encounter for palliative care: Secondary | ICD-10-CM

## 2023-08-16 DIAGNOSIS — Z885 Allergy status to narcotic agent status: Secondary | ICD-10-CM

## 2023-08-16 DIAGNOSIS — I4891 Unspecified atrial fibrillation: Secondary | ICD-10-CM | POA: Diagnosis present

## 2023-08-16 DIAGNOSIS — C7951 Secondary malignant neoplasm of bone: Secondary | ICD-10-CM | POA: Diagnosis not present

## 2023-08-16 DIAGNOSIS — G8929 Other chronic pain: Secondary | ICD-10-CM | POA: Diagnosis present

## 2023-08-16 DIAGNOSIS — Z66 Do not resuscitate: Secondary | ICD-10-CM | POA: Diagnosis present

## 2023-08-16 DIAGNOSIS — C50411 Malignant neoplasm of upper-outer quadrant of right female breast: Secondary | ICD-10-CM | POA: Diagnosis not present

## 2023-08-16 DIAGNOSIS — K3189 Other diseases of stomach and duodenum: Secondary | ICD-10-CM | POA: Diagnosis not present

## 2023-08-16 DIAGNOSIS — Z6828 Body mass index (BMI) 28.0-28.9, adult: Secondary | ICD-10-CM | POA: Diagnosis not present

## 2023-08-16 DIAGNOSIS — K449 Diaphragmatic hernia without obstruction or gangrene: Secondary | ICD-10-CM | POA: Diagnosis not present

## 2023-08-16 DIAGNOSIS — R195 Other fecal abnormalities: Secondary | ICD-10-CM | POA: Diagnosis not present

## 2023-08-16 DIAGNOSIS — K921 Melena: Principal | ICD-10-CM | POA: Diagnosis present

## 2023-08-16 DIAGNOSIS — K589 Irritable bowel syndrome without diarrhea: Secondary | ICD-10-CM | POA: Diagnosis present

## 2023-08-16 DIAGNOSIS — T451X5A Adverse effect of antineoplastic and immunosuppressive drugs, initial encounter: Secondary | ICD-10-CM | POA: Diagnosis present

## 2023-08-16 DIAGNOSIS — Q2730 Arteriovenous malformation, site unspecified: Secondary | ICD-10-CM | POA: Diagnosis not present

## 2023-08-16 DIAGNOSIS — Z96652 Presence of left artificial knee joint: Secondary | ICD-10-CM | POA: Diagnosis not present

## 2023-08-16 DIAGNOSIS — F319 Bipolar disorder, unspecified: Secondary | ICD-10-CM | POA: Diagnosis not present

## 2023-08-16 DIAGNOSIS — Z888 Allergy status to other drugs, medicaments and biological substances status: Secondary | ICD-10-CM

## 2023-08-16 DIAGNOSIS — K31819 Angiodysplasia of stomach and duodenum without bleeding: Secondary | ICD-10-CM

## 2023-08-16 DIAGNOSIS — K219 Gastro-esophageal reflux disease without esophagitis: Secondary | ICD-10-CM | POA: Diagnosis not present

## 2023-08-16 DIAGNOSIS — D61810 Antineoplastic chemotherapy induced pancytopenia: Secondary | ICD-10-CM | POA: Diagnosis not present

## 2023-08-16 DIAGNOSIS — E669 Obesity, unspecified: Secondary | ICD-10-CM | POA: Diagnosis present

## 2023-08-16 DIAGNOSIS — M797 Fibromyalgia: Secondary | ICD-10-CM | POA: Diagnosis present

## 2023-08-16 DIAGNOSIS — C801 Malignant (primary) neoplasm, unspecified: Secondary | ICD-10-CM | POA: Diagnosis not present

## 2023-08-16 DIAGNOSIS — C7889 Secondary malignant neoplasm of other digestive organs: Secondary | ICD-10-CM | POA: Diagnosis not present

## 2023-08-16 DIAGNOSIS — R238 Other skin changes: Secondary | ICD-10-CM | POA: Diagnosis not present

## 2023-08-16 DIAGNOSIS — D7589 Other specified diseases of blood and blood-forming organs: Secondary | ICD-10-CM | POA: Diagnosis present

## 2023-08-16 DIAGNOSIS — I480 Paroxysmal atrial fibrillation: Secondary | ICD-10-CM | POA: Diagnosis not present

## 2023-08-16 DIAGNOSIS — Z79899 Other long term (current) drug therapy: Secondary | ICD-10-CM

## 2023-08-16 DIAGNOSIS — Z9011 Acquired absence of right breast and nipple: Secondary | ICD-10-CM

## 2023-08-16 DIAGNOSIS — Z8249 Family history of ischemic heart disease and other diseases of the circulatory system: Secondary | ICD-10-CM | POA: Diagnosis not present

## 2023-08-16 DIAGNOSIS — Z7901 Long term (current) use of anticoagulants: Secondary | ICD-10-CM

## 2023-08-16 DIAGNOSIS — F418 Other specified anxiety disorders: Secondary | ICD-10-CM | POA: Diagnosis not present

## 2023-08-16 DIAGNOSIS — K259 Gastric ulcer, unspecified as acute or chronic, without hemorrhage or perforation: Secondary | ICD-10-CM | POA: Diagnosis not present

## 2023-08-16 DIAGNOSIS — D61818 Other pancytopenia: Secondary | ICD-10-CM | POA: Diagnosis present

## 2023-08-16 DIAGNOSIS — Z882 Allergy status to sulfonamides status: Secondary | ICD-10-CM

## 2023-08-16 DIAGNOSIS — Z83719 Family history of colon polyps, unspecified: Secondary | ICD-10-CM

## 2023-08-16 DIAGNOSIS — Z170 Estrogen receptor positive status [ER+]: Secondary | ICD-10-CM

## 2023-08-16 DIAGNOSIS — I10 Essential (primary) hypertension: Secondary | ICD-10-CM | POA: Diagnosis present

## 2023-08-16 DIAGNOSIS — K254 Chronic or unspecified gastric ulcer with hemorrhage: Secondary | ICD-10-CM | POA: Diagnosis present

## 2023-08-16 DIAGNOSIS — D62 Acute posthemorrhagic anemia: Secondary | ICD-10-CM | POA: Diagnosis not present

## 2023-08-16 DIAGNOSIS — G4730 Sleep apnea, unspecified: Secondary | ICD-10-CM | POA: Diagnosis present

## 2023-08-16 DIAGNOSIS — Z79891 Long term (current) use of opiate analgesic: Secondary | ICD-10-CM

## 2023-08-16 HISTORY — DX: Drug induced subacute dyskinesia: G24.01

## 2023-08-16 LAB — CBC WITH DIFFERENTIAL/PLATELET
Abs Immature Granulocytes: 0.03 10*3/uL (ref 0.00–0.07)
Basophils Absolute: 0 10*3/uL (ref 0.0–0.1)
Basophils Relative: 1 %
Eosinophils Absolute: 0 10*3/uL (ref 0.0–0.5)
Eosinophils Relative: 0 %
HCT: 17.7 % — ABNORMAL LOW (ref 36.0–46.0)
Hemoglobin: 5.3 g/dL — CL (ref 12.0–15.0)
Immature Granulocytes: 1 %
Lymphocytes Relative: 5 %
Lymphs Abs: 0.2 10*3/uL — ABNORMAL LOW (ref 0.7–4.0)
MCH: 32.3 pg (ref 26.0–34.0)
MCHC: 29.9 g/dL — ABNORMAL LOW (ref 30.0–36.0)
MCV: 107.9 fL — ABNORMAL HIGH (ref 80.0–100.0)
Monocytes Absolute: 0.3 10*3/uL (ref 0.1–1.0)
Monocytes Relative: 8 %
Neutro Abs: 3 10*3/uL (ref 1.7–7.7)
Neutrophils Relative %: 85 %
Platelets: 140 10*3/uL — ABNORMAL LOW (ref 150–400)
RBC: 1.64 MIL/uL — ABNORMAL LOW (ref 3.87–5.11)
RDW: 19.4 % — ABNORMAL HIGH (ref 11.5–15.5)
WBC: 3.5 10*3/uL — ABNORMAL LOW (ref 4.0–10.5)
nRBC: 1.1 % — ABNORMAL HIGH (ref 0.0–0.2)

## 2023-08-16 LAB — COMPREHENSIVE METABOLIC PANEL
ALT: 23 U/L (ref 0–44)
AST: 53 U/L — ABNORMAL HIGH (ref 15–41)
Albumin: 2.6 g/dL — ABNORMAL LOW (ref 3.5–5.0)
Alkaline Phosphatase: 214 U/L — ABNORMAL HIGH (ref 38–126)
Anion gap: 8 (ref 5–15)
BUN: 14 mg/dL (ref 8–23)
CO2: 25 mmol/L (ref 22–32)
Calcium: 8.5 mg/dL — ABNORMAL LOW (ref 8.9–10.3)
Chloride: 105 mmol/L (ref 98–111)
Creatinine, Ser: 0.79 mg/dL (ref 0.44–1.00)
GFR, Estimated: >60 mL/min (ref >60)
Glucose, Bld: 159 mg/dL — ABNORMAL HIGH (ref 70–99)
Potassium: 3.6 mmol/L (ref 3.5–5.1)
Sodium: 138 mmol/L (ref 135–145)
Total Bilirubin: 1 mg/dL (ref 0.0–1.2)
Total Protein: 5.4 g/dL — ABNORMAL LOW (ref 6.5–8.1)

## 2023-08-16 LAB — LIPASE, BLOOD: Lipase: 21 U/L (ref 11–51)

## 2023-08-16 LAB — LACTIC ACID, PLASMA: Lactic Acid, Venous: 2.2 mmol/L (ref 0.5–1.9)

## 2023-08-16 MED ORDER — PANTOPRAZOLE SODIUM 40 MG IV SOLR
80.0000 mg | Freq: Once | INTRAVENOUS | Status: AC
  Administered 2023-08-16: 80 mg via INTRAVENOUS
  Filled 2023-08-16: qty 20

## 2023-08-16 MED ORDER — PALBOCICLIB 75 MG PO TABS: 75.0000 mg | ORAL_TABLET | Freq: Every day | ORAL | Status: DC

## 2023-08-16 MED ORDER — HYDROMORPHONE HCL 1 MG/ML IJ SOLN: 1.0000 mg | INTRAMUSCULAR | Status: DC | PRN

## 2023-08-16 MED ORDER — DEXTROSE IN LACTATED RINGERS 5 % IV SOLN
INTRAVENOUS | Status: DC
  Administered 2023-08-16: 100 mL via INTRAVENOUS

## 2023-08-16 MED ORDER — ONDANSETRON HCL 4 MG/2ML IJ SOLN
4.0000 mg | Freq: Four times a day (QID) | INTRAMUSCULAR | Status: DC | PRN
  Administered 2023-08-17 – 2023-08-18 (×2): 4 mg via INTRAVENOUS
  Filled 2023-08-16 (×2): qty 2

## 2023-08-16 MED ORDER — ONDANSETRON HCL 4 MG PO TABS: 4.0000 mg | ORAL_TABLET | Freq: Four times a day (QID) | ORAL | Status: DC | PRN

## 2023-08-16 MED ORDER — PANTOPRAZOLE SODIUM 40 MG IV SOLR
40.0000 mg | Freq: Two times a day (BID) | INTRAVENOUS | Status: DC
  Administered 2023-08-17 (×2): 40 mg via INTRAVENOUS
  Filled 2023-08-16 (×2): qty 10

## 2023-08-16 MED ORDER — SODIUM CHLORIDE 0.9% IV SOLUTION
Freq: Once | INTRAVENOUS | Status: AC
  Administered 2023-08-17: 100 mL via INTRAVENOUS

## 2023-08-16 NOTE — Telephone Encounter (Signed)
 Received call from pt stating she is having black stool; denies pain but states her stomach feels funny When asked to describe she states, I dont know, it just feels weird but doesn't hurt.   States this is her first BM in 5 days and has been taking miralax. She was scheduled to come in to [facility] 2/7 for 2 units PRBC d/t hgb 6.0. Pt was advised to go to ED. Report called to Odetta Naomi PEAK in [facility] ED.

## 2023-08-16 NOTE — ED Triage Notes (Signed)
 Pt sent here from PCP office after she noticed she was having black stool. Pt denies being on iron or any blood thinners. Denies any lightheadedness, dizziness, or SOB. Pt w/ hx of Bone Ca and a rt sided mastectomy. [facility] and due to be transfused w/ 2units in the am @0800 .

## 2023-08-16 NOTE — ED Notes (Signed)
 I attempted to collect labs twice and was unsuccessful.  Patient got a skin tear on her left arm.  Nurse made aware and she put a Tegaderm on it.  Patient had bruise from earlier on her arm and hand

## 2023-08-16 NOTE — ED Provider Notes (Signed)
 [place] EMERGENCY DEPARTMENT AT [facility] Provider Note   CSN: [id number] Arrival date & time: 08/16/23  1803     History Chief Complaint  Patient presents with   Medical Clearance    HPI Doris Smith is a 73 y.o. female presenting for melena. Patient states that earlier this week she felt melena.  Hemoglobin 6.02 days ago scheduled for an outpatient transfusion tomorrow.  He was initially thought to be medication related. Patient denies fevers chills nausea vomiting syncope shortness of breath.  She is otherwise ambulatory tolerating p.o. intake.  Patient's recorded medical, surgical, social, medication list and allergies were reviewed in the Snapshot window as part of the initial history.   Review of Systems   Review of Systems  Constitutional:  Negative for chills and fever.  HENT:  Negative for ear pain and sore throat.   Eyes:  Negative for pain and visual disturbance.  Respiratory:  Negative for cough and shortness of breath.   Cardiovascular:  Negative for chest pain and palpitations.  Gastrointestinal:  Positive for blood in stool. Negative for abdominal pain and vomiting.  Genitourinary:  Negative for dysuria and hematuria.  Musculoskeletal:  Negative for arthralgias and back pain.  Skin:  Negative for color change and rash.  Neurological:  Negative for seizures and syncope.  All other systems reviewed and are negative.   Physical Exam Updated Vital Signs BP (!) 105/57 (BP Location: Left Arm)   Pulse (!) 118   Temp 98.6 F (37 C) (Oral)   Resp 16   Ht 5' 4 (1.626 m)   Wt 75.8 kg   SpO2 100%   BMI 28.67 kg/m  Physical Exam Vitals and nursing note reviewed.  Constitutional:      General: She is not in acute distress.    Appearance: She is well-developed.  HENT:     Head: Normocephalic and atraumatic.  Eyes:     Conjunctiva/sclera: Conjunctivae normal.  Cardiovascular:     Rate and Rhythm: Normal rate and regular rhythm.     Heart  sounds: No murmur heard. Pulmonary:     Effort: Pulmonary effort is normal. No respiratory distress.     Breath sounds: Normal breath sounds.  Abdominal:     General: There is no distension.     Palpations: Abdomen is soft.     Tenderness: There is no abdominal tenderness. There is no right CVA tenderness or left CVA tenderness.  Musculoskeletal:        General: No swelling or tenderness. Normal range of motion.     Cervical back: Neck supple.  Skin:    General: Skin is warm and dry.  Neurological:     General: No focal deficit present.     Mental Status: She is alert and oriented to person, place, and time. Mental status is at baseline.     Cranial Nerves: No cranial nerve deficit.      ED Course/ Medical Decision Making/ A&P    Procedures .Critical Care  Performed by: Jerral Meth, MD Authorized by: Jerral Meth, MD   Critical care provider statement:    Critical care time (minutes):  45   Critical care was time spent personally by me on the following activities:  Development of treatment plan with patient or surrogate, discussions with consultants, evaluation of patient's response to treatment, examination of patient, ordering and review of laboratory studies, ordering and review of radiographic studies, ordering and performing treatments and interventions, pulse oximetry, re-evaluation of patient's condition  and review of old charts    Medications Ordered in ED Medications  0.9 %  sodium chloride  infusion (Manually program via Guardrails IV Fluids) (has no administration in time range)  pantoprazole  (PROTONIX ) injection 80 mg (80 mg Intravenous Given 08/16/23 2225)    Medical Decision Making:   Patient's history of present onset physical exam findings are most consistent with acute blood loss anemia.  Hemoglobin today 5.3.  Appears to be from her melena per the picture of her most recent bowel movement. She is consented for a 2 unit transfusion which was started  down here in the emergency room. Consulted with gastroenterology asynchronously via direct messaging per recent request from the gastroenterology team. Medicine team aware of asynchronous consult. Disposition:  I have considered need for hospitalization, however, considering all of the above, I believe this patient is stable for discharge at this time.  Patient/family educated about specific return precautions for given chief complaint and symptoms.  Patient/family educated about follow-up with PCP.     Patient/family expressed understanding of return precautions and need for follow-up. Patient spoken to regarding all imaging and laboratory results and appropriate follow up for these results. All education provided in verbal form with additional information in written form. Time was allowed for answering of patient questions. Patient discharged.    Emergency Department Medication Summary:   Medications  0.9 %  sodium chloride  infusion (Manually program via Guardrails IV Fluids) (has no administration in time range)  pantoprazole  (PROTONIX ) injection 80 mg (80 mg Intravenous Given 08/16/23 2225)         Clinical Impression:  1. Melena      Admit   Final Clinical Impression(s) / ED Diagnoses Final diagnoses:  Melena    Rx / DC Orders ED Discharge Orders     None         Jerral Meth, MD 08/16/23 2316

## 2023-08-16 NOTE — ED Provider Triage Note (Signed)
 Emergency Medicine Provider Triage Evaluation Note  Doris Smith , a 73 y.o. female  was evaluated in triage.  Pt complains of black stools that she noticed today.  States she is not on anticoag. Some diffuse abd pain. No fevers.   Review of Systems  Positive: Abd pain, black stool Negative: Fever   Physical Exam  BP (!) 105/57 (BP Location: Left Arm)   Pulse (!) 118   Temp 98.6 F (37 C) (Oral)   Resp 16   Ht 5' 4 (1.626 m)   Wt 75.8 kg   SpO2 100%   BMI 28.67 kg/m  Gen:   Awake, no distress   Resp:  Normal effort  MSK:   Moves extremities without difficulty Other:  Diffuse abd TTP, protuberant abd   Medical Decision Making  Medically screening exam initiated at 8:02 PM.  Appropriate orders placed.  Doris Smith was informed that the remainder of the evaluation will be completed by another provider, this initial triage assessment does not replace that evaluation, and the importance of remaining in the ED until their evaluation is complete.  [address]   Doris Smith, [place] 08/16/23 2007

## 2023-08-16 NOTE — ED Notes (Signed)
 Pt refusing to allow this RN to attempt any more IV sticks- pt says she needs an ultrasound IV

## 2023-08-16 NOTE — Progress Notes (Signed)
 Ed provider able to establish access.

## 2023-08-16 NOTE — H&P (Signed)
 History and Physical    Patient: Doris Smith FMW:[id number] DOB: [birth year] DOA: 08/16/2023 DOS: the patient was seen and examined on 08/16/2023 PCP: [name], FNP  Patient coming from: Home  Chief Complaint:  Chief Complaint  Patient presents with   Medical Clearance   HPI: Doris Smith is a 73 y.o. female with medical history significant of bipolar disorder, essential hypertension, atrial fibrillation on anticoagulation, GERD, morbid obesity, breast cancer on oral chemotherapy, anxiety disorder who presents with anemia.  Patient apparently had blood work done at the cancer center yesterday.  She was found to have a hemoglobin of 6.0 as was being set up for outpatient blood transfusion.  She was largely asymptomatic so the thought was patient's anemia was related to her chemotherapy.  Patient however has been having dark tarry stools at home apparently.  She came to the ER today with hemoglobin of 5.8.  She has for a long period of time being eating ice all the time practicing pica.  She denied any weakness.  She denied any palpitations.  No significant shortness of breath.  Patient has been transfused in the ER and being admitted to the hospital for evaluation of these anemia which appears to be possibly GI related.  Review of Systems: As mentioned in the history of present illness. All other systems reviewed and are negative. Past Medical History:  Diagnosis Date   A-fib (HCC) 11/2012   PAF   Anxiety    takes Ativan    Asthma 04/07/2011   dx   Bipolar disorder (HCC)    Cancer (HCC)    Right breast   Depression    Early cataracts, bilateral    Fatty liver    Fibromyalgia    GERD (gastroesophageal reflux disease)    Irritable bowel syndrome    Mental disorder    dx bipolar   PONV (postoperative nausea and vomiting)    Sleep apnea 04/2011    does not wear CPAP   Past Surgical History:  Procedure Laterality Date   ABDOMINAL HYSTERECTOMY     COLONOSCOPY      DILATION AND CURETTAGE OF UTERUS  1981   abnormal pap   KNEE ARTHROSCOPY Left 2007   x 2   LUMBAR LAMINECTOMY/DECOMPRESSION MICRODISCECTOMY  05/31/2011   Procedure: LUMBAR LAMINECTOMY/DECOMPRESSION MICRODISCECTOMY;  Surgeon: Reyes JAYSON Billing;  Location: [facility];  Service: Orthopedics;  Laterality: Left;  Decompression Lumbar four to five and  Lumbar five to Sacral one on Left  (X-Ray)   MASTECTOMY W/ SENTINEL NODE BIOPSY Right 09/16/2018   Procedure: RIGHT MASTECTOMY WITH RIGHT AXILLARY SENTINEL LYMPH NODE BIOPSY;  Surgeon: Ethyl Lenis, MD;  Location[facility];  Service: General;  Laterality: Right;   PARTIAL HYSTERECTOMY  1982   PORTACATH PLACEMENT Left 10/31/2018   Procedure: INSERTION PORT-A-CATH WITH ULTRASOUND;  Surgeon: Ethyl Lenis, MD;  Location: [facility];  Service: General;  Laterality: Left;   TONSILLECTOMY     as child   TOTAL KNEE ARTHROPLASTY Left 12/18/2005   Social History:  reports that she has never smoked. She has never used smokeless tobacco. She reports that she does not drink alcohol and does not use drugs.  Allergies  Allergen Reactions   Iohexol     over 20 years ago had reaction hurting in arm and heart-Done in [place], [place].SABRAphysician stopped CT at that time.   Codeine Other (See Comments)    Makes feel like she is wild    Palonosetron  Other (See Comments)  headache   Prednisone  Other (See Comments)    Makes me very irritable   Sulfonamide Derivatives Other (See Comments)    Upset stomach   Celecoxib Nausea And Vomiting   Sulfa Antibiotics Nausea And Vomiting   Vitamin B12 Rash    Family History  Problem Relation Age of Onset   Anesthesia problems Mother    Heart disease Mother        CHF, atrial fib   Heart failure Mother    Anesthesia problems Sister    Colon polyps Father    Heart disease Father        Fluid around the heart   Hypertension Sister    Breast cancer Maternal Aunt    Colon cancer Maternal Aunt    Prostate  cancer Neg Hx    Ovarian cancer Neg Hx     Prior to Admission medications   Medication Sig Start Date End Date Taking? Authorizing Provider  apixaban  (ELIQUIS ) 5 MG TABS tablet Take 1 tablet (5 mg total) by mouth 2 (two) times daily. 12/29/22   Gladis [name], FNP  clotrimazole  (MYCELEX ) 10 MG troche Take 1 tablet (10 mg total) by mouth 5 (five) times daily. 12/08/22   Pickenpack-Cousar, Athena N, NP  clotrimazole -betamethasone  (LOTRISONE ) cream APPLY  CREAM TOPICALLY TWICE DAILY 10/02/19   Gladis, [name], FNP  esomeprazole  (NEXIUM ) 40 MG capsule Take 1 capsule (40 mg total) by mouth daily. 07/17/23   Pickenpack-Cousar, Athena N, NP  fluticasone  (FLONASE ) 50 MCG/ACT nasal spray Place 2 sprays into both nostrils daily. 05/30/21   Gladis [name], FNP  furosemide  (LASIX ) 20 MG tablet Take 1 tablet (20 mg total) by mouth daily. 12/29/22   [name], FNP  Incontinence Supply Disposable (CERTAINTY FITTED BRIEFS XL) MISC 1 Package by Does not apply route as needed. 11/30/22   Pickenpack-Cousar, Athena N, NP  lidocaine  (LIDODERM ) 5 % Place 2 patches onto the skin daily. Remove & Discard patch within 12 hours or as directed by MD 06/08/23   Pickenpack-Cousar, Fannie SAILOR, NP  loratadine  (CLARITIN ) 10 MG tablet Take 10 mg by mouth daily.    [provider]  LORazepam  (ATIVAN ) 1 MG tablet Take 1 tablet (1 mg total) by mouth 2 (two) times daily as needed for anxiety. 05/24/23   [name], FNP  magic mouthwash (nystatin , diphenhydrAMINE , alum & mag hydroxide) suspension mixture Swish and swallow 5 mLs 4 (four) times daily as needed for mouth pain. 11/30/22   Pickenpack-Cousar, Athena N, NP  metoprolol  tartrate (LOPRESSOR ) 25 MG tablet Take 1 tablet (25 mg total) by mouth 2 (two) times daily. 12/29/22   Gladis, [name], FNP  midodrine  (PROAMATINE ) 2.5 MG tablet Take 1 tablet (2.5 mg total) by mouth 3 (three) times daily with meals. 12/30/21   Lavona Agent, MD   nystatin  (MYCOSTATIN ) 100000 UNIT/ML suspension Take 5 mLs (500,000 Units total) by mouth 4 (four) times daily. 05/03/23   Gladis [name], FNP  ondansetron  (ZOFRAN ) 8 MG tablet Take 1 tablet (8 mg total) by mouth every 8 (eight) hours as needed for nausea or vomiting. 05/03/23   Gladis, [name], FNP  oxyCODONE  (OXYCONTIN ) 15 mg 12 hr tablet Take 1 tablet (15 mg total) by mouth every 12 (twelve) hours. 07/24/23   Pickenpack-Cousar, Athena N, NP  oxyCODONE -acetaminophen  (PERCOCET) 10-325 MG tablet Take 1 tablet by mouth every 4 (four) hours as needed for pain. 06/25/23   Pickenpack-Cousar, Athena N, NP  palbociclib  (IBRANCE ) 75 MG tablet Take 1 tablet (75 mg total) by mouth daily. Take  for 21 days on, 7 days off, repeat every 28 days. 01/24/23   Iruku, Praveena, MD  polyethylene glycol (MIRALAX / GLYCOLAX) 17 g packet Take 17 g by mouth daily.    [provider]  pramipexole  (MIRAPEX ) 1 MG tablet TAKE 1 TABLET BY MOUTH THREE TIMES DAILY 08/10/23   Gladis, [name], FNP  prochlorperazine  (COMPAZINE ) 10 MG tablet Take 1 tablet (10 mg total) by mouth every 6 (six) hours as needed for nausea or vomiting. 08/02/23   Pickenpack-Cousar, Fannie SAILOR, NP  triamcinolone  (KENALOG) 0.1 % Apply 1 application. topically 2 (two) times daily. 06/21/20   [provider]  Zoledronic  Acid (ZOMETA  IV) Inject into the vein.    [provider]    Physical Exam: Vitals:   08/16/23 1815 08/16/23 1948 08/16/23 1950  BP: (!) 108/49 (!) 105/57   Pulse: (!) 117 (!) 118   Resp: 16 16   Temp: 97.9 F (36.6 C) 98.6 F (37 C)   TempSrc: Oral Oral   SpO2: 95% 100%   Weight:   75.8 kg  Height:   5' 4 (1.626 m)   Constitutional: Stable with no new issues no complaint, calm, comfortable Eyes: PERRL, lids and conjunctivae pale  ENMT: Mucous membranes are moist. Posterior pharynx clear of any exudate or lesions.Normal dentition.  Neck: normal, supple, no masses, no  thyromegaly Respiratory: clear to auscultation bilaterally, no wheezing, no crackles. Normal respiratory effort. No accessory muscle use.  Cardiovascular: Sinus tachycardia no murmurs / rubs / gallops. No extremity edema. 2+ pedal pulses. No carotid bruits.  Abdomen: no tenderness, no masses palpated. No hepatosplenomegaly. Bowel sounds positive.  Musculoskeletal: Good range of motion, no joint swelling or tenderness, Skin: no rashes, lesions, ulcers. No induration Neurologic: CN 2-12 grossly intact. Sensation intact, DTR normal. Strength 5/5 in all 4.  Psychiatric: Normal judgment and insight. Alert and oriented x 3. Normal mood  Data Reviewed:  Blood pressure is 105/57 with pulse 118, glucose 159 calcium 8.5, alkaline phos was 214 albumin 2.6, lactic acid 2.2, white count 3.5 hemoglobin 5.3 and platelets 140,  Assessment and Plan:  #1 pancytopenia: May be related to patient's chemotherapy could also be worsened by the acute GI bleed.  Patient's GI bleed will be worked up by GI.  We will transfuse 2 units of packed red blood cells and monitor.  #2 melena: Patient reported melanotic stools.  No NSAID use.  Suspect patient has chronic iron deficiency probably chronic acute blood loss.  She reported long-term craving for ice.  At this point we will admit the patient.  Keep NPO.  Serial H&H.  IV Protonix .  GI consult.  Hold patient's Eliquis    #3 breast cancer: Following up with oncology.  Patient on oral chemotherapy. Patient is taking Ibrance   #4 atrial fibrillation: Patient is on Eliquis .  Will hold Eliquis .  Continue with GI workup. Rate is not controlled.  IV metoprolol  as needed.  #5 anxiety disorder: Continue anxiolytics.  #6 anemia of acute blood loss: As per above.  #7 GERD: Continue with PPIs  #8 bipolar disorder: Resume home regimen when cleared for oral intake.  #9 essential hypertension: Hold antihypertensives due to GI bleed.  Blood pressure is marginally low.    Advance  Care Planning:   Code Status: Full Code   Consults: Dr. Albertus, GI  Family Communication: Husband at bedside  Severity of Illness: The appropriate patient status for this patient is INPATIENT. Inpatient status is judged to be reasonable and necessary in order  to provide the required intensity of service to ensure the patient's safety. The patient's presenting symptoms, physical exam findings, and initial radiographic and laboratory data in the context of their chronic comorbidities is felt to place them at high risk for further clinical deterioration. Furthermore, it is not anticipated that the patient will be medically stable for discharge from the hospital within 2 midnights of admission.   * I certify that at the point of admission it is my clinical judgment that the patient will require inpatient hospital care spanning beyond 2 midnights from the point of admission due to high intensity of service, high risk for further deterioration and high frequency of surveillance required.*  AuthorBETHA SIM KNOLL, MD 08/16/2023 10:18 PM  For on call review [email or web address].

## 2023-08-16 NOTE — Progress Notes (Signed)
 Prior-To-Admission Oral Chemotherapy for Treatment of Oncologic Disease   Order noted from Dr. Sim to continue prior-to-admission oral chemotherapy regimen of palbociclib .  Procedure Per Pharmacy & Therapeutics Committee Policy: Orders for continuation of home oral chemotherapy for treatment of an oncologic disease will be held unless approved by an oncologist during current admission.    For patients receiving oncology care at [facility], inpatient pharmacist contacts patient's oncologist during regular office hours to review. If earlier review is medically necessary, attending physician consults [facility] on-call oncologist   For patients receiving oncology care outside of [facility], attending physician consults patient's oncologist to review. If this oncologist or their coverage cannot be reached, attending physician consults [facility] on-call oncologist   Oral chemotherapy continuation order is on hold pending oncologist review, [facility] oncologist Dr. Loretha will be notified by inpatient pharmacy during office hours      Leeroy Mace RPh 08/16/2023, 11:40 PM

## 2023-08-16 NOTE — Telephone Encounter (Signed)
 Chief Complaint: black stool Symptoms: one episode of black stool Frequency: today Pertinent Negatives: Patient denies worsening weakness or dizziness, large amount of blood loss, passing out Disposition: [x] ED /[] Urgent Care (no appt availability in office) / [] Appointment(In office/virtual)/ []  [place] [facility]/ [] Home Care/ [] Refused Recommended Disposition /[] [place] [facility]/ []  Follow-up with PCP Additional Notes: Pt reports one soft, black bowel movement today. Pt states she was constipated for the last 5 days, then had a black bowel movement. No abd pain or vomiting. Did not lose a large amount of red blood. Denies worsening dizziness, weakness, or passing out. Pt sounds well on the phone. Pt endorses some weakness at baseline as she is a cancer pt. Pt's hgb on 2/4 was 6. Pt states she has been receiving blood transfusions lately and has one scheduled for tomorrow. RN advised pt she needs to go to the ED. Pt agreeable, states she will go to [employer] and her husband is going to take her and they'll leave right now. RN advised pt she needs to call 911 if she loses a large amount of blood or becomes more weak or dizzy between now and the time they arrive. Pt verbalized understanding.   Copied from CRM [id number]. Topic: Clinical - Medical Advice >> Aug 16, 2023  4:36 PM Doris Smith wrote: Reason for CRM: Patient called in regarding her bowl movement being black. She is asking is she should store it and bring it tomorrow. Reason for Disposition  Black or tarry bowel movements  (Exception: Chronic-unchanged black-grey BMs AND is taking iron pills or Pepto-Bismol.)  Answer Assessment - Initial Assessment Questions 1. COLOR: What color is it? Is that color in part or all of the stool?     Stool is black 2. ONSET: When was the unusual color first noted?     Today - just one bowel movement today 3. CAUSE: Have you eaten any food or taken any medicine of this color? Note: See  listing in Background Information section.      No 4. OTHER SYMPTOMS: Do you have any other symptoms? (e.g., abdomen pain, diarrhea, jaundice, fever).     Constipated (4 days w/ out a bowel movement), stool today was black and soft, pt denies feeling more lightheaded, weak, or dizzy than normal, cancer pt that receives blood transfusions (last Hgb is 6), never had rectal bleeding before, I probably look more pale than normal, I probably do, not sure though  Answer Assessment - Initial Assessment Questions 1. APPEARANCE of BLOOD: What color is it? Is it passed separately, on the surface of the stool, or mixed in with the stool?      Black stool, soft  2. AMOUNT: How much blood was passed?      Just one black stool 3. FREQUENCY: How many times has blood been passed with the stools?      One bowel movement today, otherwise was constipated for 5 days 4. ONSET: When was the blood first seen in the stools? (Days or weeks)      Today only 5. DIARRHEA: Is there also some diarrhea? If Yes, ask: How many diarrhea stools in the past 24 hours?      No diarrhea, just constipated for 5 days, and a soft black stool today 6. CONSTIPATION: Do you have constipation? If Yes, ask: How bad is it?     Yes - 5 days 7. RECURRENT SYMPTOMS: Have you had blood in your stools before? If Yes, ask: When was  the last time? and What happened that time?      Never 8. BLOOD THINNERS: Do you take any blood thinners? (e.g., Coumadin/warfarin, Pradaxa/dabigatran, aspirin)     Was on Eliquis  - states she was taken off due to a low Hgb, hasn't taken it in a while (can't recall when she stopped taking it - at least 2 months) 9. OTHER SYMPTOMS: Do you have any other symptoms?  (e.g., abdomen pain, vomiting, dizziness, fever)     No abdominal pain, no vomiting, no more weak or dizzy than her baseline, no fever, no chills  Protocols used: Stools - Unusual Color-A-AH, Rectal Bleeding-A-AH

## 2023-08-16 NOTE — ED Notes (Signed)
 Date and time results received: 08/16/23 9:52 PM  (use smartphrase .now to insert current time)  Test: Hemoglobin Critical Value: 5.3  Name of Provider Notified: [facility]  Orders Received? Or Actions Taken?: Orders Received - See Orders for details

## 2023-08-17 ENCOUNTER — Inpatient Hospital Stay: Payer: Medicare Other

## 2023-08-17 ENCOUNTER — Inpatient Hospital Stay: Payer: Medicare Other | Admitting: Nurse Practitioner

## 2023-08-17 ENCOUNTER — Encounter (HOSPITAL_COMMUNITY)
Admission: EM | Disposition: A | Discharge status: Home / Self Care | Payer: Self-pay | Source: Home / Self Care | Attending: Internal Medicine

## 2023-08-17 ENCOUNTER — Encounter (HOSPITAL_COMMUNITY): Payer: Self-pay | Admitting: Internal Medicine

## 2023-08-17 ENCOUNTER — Inpatient Hospital Stay (HOSPITAL_COMMUNITY): Payer: Medicare Other | Admitting: Anesthesiology

## 2023-08-17 DIAGNOSIS — R195 Other fecal abnormalities: Secondary | ICD-10-CM | POA: Diagnosis not present

## 2023-08-17 DIAGNOSIS — T451X5A Adverse effect of antineoplastic and immunosuppressive drugs, initial encounter: Secondary | ICD-10-CM

## 2023-08-17 DIAGNOSIS — D61810 Antineoplastic chemotherapy induced pancytopenia: Secondary | ICD-10-CM

## 2023-08-17 DIAGNOSIS — K31819 Angiodysplasia of stomach and duodenum without bleeding: Secondary | ICD-10-CM

## 2023-08-17 DIAGNOSIS — K259 Gastric ulcer, unspecified as acute or chronic, without hemorrhage or perforation: Secondary | ICD-10-CM | POA: Diagnosis not present

## 2023-08-17 DIAGNOSIS — E440 Moderate protein-calorie malnutrition: Secondary | ICD-10-CM | POA: Insufficient documentation

## 2023-08-17 DIAGNOSIS — K449 Diaphragmatic hernia without obstruction or gangrene: Secondary | ICD-10-CM

## 2023-08-17 DIAGNOSIS — K921 Melena: Secondary | ICD-10-CM | POA: Diagnosis not present

## 2023-08-17 HISTORY — PX: BIOPSY: SHX5522

## 2023-08-17 HISTORY — PX: ESOPHAGOGASTRODUODENOSCOPY (EGD) WITH PROPOFOL: SHX5813

## 2023-08-17 HISTORY — PX: HOT HEMOSTASIS: SHX5433

## 2023-08-17 LAB — CBC
HCT: 15.3 % — ABNORMAL LOW (ref 36.0–46.0)
HCT: 26 % — ABNORMAL LOW (ref 36.0–46.0)
HCT: 25.5 % — ABNORMAL LOW (ref 36.0–46.0)
HCT: 23.5 % — ABNORMAL LOW (ref 36.0–46.0)
Hemoglobin: 4.5 g/dL — CL (ref 12.0–15.0)
Hemoglobin: 8.3 g/dL — ABNORMAL LOW (ref 12.0–15.0)
Hemoglobin: 8.2 g/dL — ABNORMAL LOW (ref 12.0–15.0)
Hemoglobin: 7.6 g/dL — ABNORMAL LOW (ref 12.0–15.0)
MCH: 31.7 pg (ref 26.0–34.0)
MCH: 30.5 pg (ref 26.0–34.0)
MCH: 30.8 pg (ref 26.0–34.0)
MCH: 30 pg (ref 26.0–34.0)
MCHC: 29.4 g/dL — ABNORMAL LOW (ref 30.0–36.0)
MCHC: 31.9 g/dL (ref 30.0–36.0)
MCHC: 32.2 g/dL (ref 30.0–36.0)
MCHC: 32.3 g/dL (ref 30.0–36.0)
MCV: 107.7 fL — ABNORMAL HIGH (ref 80.0–100.0)
MCV: 95.6 fL (ref 80.0–100.0)
MCV: 95.9 fL (ref 80.0–100.0)
MCV: 92.9 fL (ref 80.0–100.0)
Platelets: 114 10*3/uL — ABNORMAL LOW (ref 150–400)
Platelets: 114 10*3/uL — ABNORMAL LOW (ref 150–400)
Platelets: 112 10*3/uL — ABNORMAL LOW (ref 150–400)
Platelets: ADEQUATE 10*3/uL (ref 150–400)
RBC: 1.42 MIL/uL — ABNORMAL LOW (ref 3.87–5.11)
RBC: 2.72 MIL/uL — ABNORMAL LOW (ref 3.87–5.11)
RBC: 2.66 MIL/uL — ABNORMAL LOW (ref 3.87–5.11)
RBC: 2.53 MIL/uL — ABNORMAL LOW (ref 3.87–5.11)
RDW: 19.8 % — ABNORMAL HIGH (ref 11.5–15.5)
RDW: 19.9 % — ABNORMAL HIGH (ref 11.5–15.5)
RDW: 20.6 % — ABNORMAL HIGH (ref 11.5–15.5)
RDW: 20.3 % — ABNORMAL HIGH (ref 11.5–15.5)
WBC: 2.8 10*3/uL — ABNORMAL LOW (ref 4.0–10.5)
WBC: 2.6 10*3/uL — ABNORMAL LOW (ref 4.0–10.5)
WBC: 3 10*3/uL — ABNORMAL LOW (ref 4.0–10.5)
WBC: 3.1 10*3/uL — ABNORMAL LOW (ref 4.0–10.5)
nRBC: 1.1 % — ABNORMAL HIGH (ref 0.0–0.2)
nRBC: 1.2 % — ABNORMAL HIGH (ref 0.0–0.2)
nRBC: 1 % — ABNORMAL HIGH (ref 0.0–0.2)
nRBC: 1 % — ABNORMAL HIGH (ref 0.0–0.2)

## 2023-08-17 LAB — COMPREHENSIVE METABOLIC PANEL
ALT: 22 U/L (ref 0–44)
AST: 44 U/L — ABNORMAL HIGH (ref 15–41)
Albumin: 2.6 g/dL — ABNORMAL LOW (ref 3.5–5.0)
Alkaline Phosphatase: 205 U/L — ABNORMAL HIGH (ref 38–126)
Anion gap: 9 (ref 5–15)
BUN: 14 mg/dL (ref 8–23)
CO2: 25 mmol/L (ref 22–32)
Calcium: 8.4 mg/dL — ABNORMAL LOW (ref 8.9–10.3)
Chloride: 107 mmol/L (ref 98–111)
Creatinine, Ser: 0.48 mg/dL (ref 0.44–1.00)
GFR, Estimated: >60 mL/min (ref >60)
Glucose, Bld: 120 mg/dL — ABNORMAL HIGH (ref 70–99)
Potassium: 3.6 mmol/L (ref 3.5–5.1)
Sodium: 141 mmol/L (ref 135–145)
Total Bilirubin: 0.9 mg/dL (ref 0.0–1.2)
Total Protein: 5.2 g/dL — ABNORMAL LOW (ref 6.5–8.1)

## 2023-08-17 LAB — FOLATE: Folate: 5.6 ng/mL — ABNORMAL LOW (ref >5.9)

## 2023-08-17 LAB — PREPARE RBC (CROSSMATCH)

## 2023-08-17 LAB — IRON AND TIBC
Iron: 120 ug/dL (ref 28–170)
Saturation Ratios: 36 % — ABNORMAL HIGH (ref 10.4–31.8)
TIBC: 332 ug/dL (ref 250–450)
UIBC: 212 ug/dL

## 2023-08-17 LAB — HIV ANTIBODY (ROUTINE TESTING W REFLEX): HIV Screen 4th Generation wRfx: NONREACTIVE

## 2023-08-17 LAB — OCCULT BLOOD X 1 CARD TO LAB, STOOL: Fecal Occult Bld: POSITIVE — AB

## 2023-08-17 LAB — VITAMIN B12: Vitamin B-12: 228 pg/mL (ref 180–914)

## 2023-08-17 SURGERY — ESOPHAGOGASTRODUODENOSCOPY (EGD) WITH PROPOFOL
Anesthesia: Monitor Anesthesia Care

## 2023-08-17 MED ORDER — LIDOCAINE HCL (CARDIAC) PF 100 MG/5ML IV SOSY
PREFILLED_SYRINGE | INTRAVENOUS | Status: DC | PRN
  Administered 2023-08-17: 80 mg via INTRAVENOUS

## 2023-08-17 MED ORDER — SUCRALFATE 1 GM/10ML PO SUSP
1.0000 g | Freq: Three times a day (TID) | ORAL | Status: DC
  Administered 2023-08-17 – 2023-08-19 (×7): 1 g via ORAL
  Filled 2023-08-17 (×7): qty 10

## 2023-08-17 MED ORDER — GLYCOPYRROLATE 0.2 MG/ML IJ SOLN
INTRAMUSCULAR | Status: DC | PRN
  Administered 2023-08-17: .2 mg via INTRAVENOUS

## 2023-08-17 MED ORDER — DEXMEDETOMIDINE HCL IN NACL 80 MCG/20ML IV SOLN
INTRAVENOUS | Status: DC | PRN
  Administered 2023-08-17: 8 ug via INTRAVENOUS

## 2023-08-17 MED ORDER — SODIUM CHLORIDE 0.9 % IV SOLN
INTRAVENOUS | Status: DC
  Administered 2023-08-17 – 2023-08-18 (×2): 75 mL/h via INTRAVENOUS

## 2023-08-17 MED ORDER — PROPOFOL 500 MG/50ML IV EMUL
INTRAVENOUS | Status: AC
  Filled 2023-08-17: qty 50

## 2023-08-17 MED ORDER — HYDROMORPHONE HCL 1 MG/ML IJ SOLN
0.5000 mg | INTRAMUSCULAR | Status: DC | PRN
  Administered 2023-08-17 – 2023-08-19 (×5): 0.5 mg via INTRAVENOUS
  Filled 2023-08-17 (×5): qty 0.5

## 2023-08-17 MED ORDER — LIDOCAINE 5 % EX PTCH
1.0000 | MEDICATED_PATCH | Freq: Once | CUTANEOUS | Status: AC
  Administered 2023-08-17: 1 via TRANSDERMAL
  Filled 2023-08-17: qty 1

## 2023-08-17 MED ORDER — SODIUM CHLORIDE 0.9 % IV SOLN: INTRAVENOUS | Status: DC | PRN

## 2023-08-17 MED ORDER — PROPOFOL 500 MG/50ML IV EMUL
INTRAVENOUS | Status: DC | PRN
  Administered 2023-08-17: 125 ug/kg/min via INTRAVENOUS

## 2023-08-17 MED ORDER — SODIUM CHLORIDE 0.9 % IV SOLN: INTRAVENOUS | Status: DC

## 2023-08-17 SURGICAL SUPPLY — 14 items

## 2023-08-17 NOTE — Progress Notes (Signed)
 Initial Nutrition Assessment  DOCUMENTATION CODES:   Non-severe (moderate) malnutrition in context of chronic illness  INTERVENTION:   Once diet advanced: -Boost Plus TID- Each supplement provides 360kcal and 14g protein.     -Placed High Calorie, High Protein handout in AVS  NUTRITION DIAGNOSIS:   Moderate Malnutrition related to cancer and cancer related treatments, chronic illness as evidenced by mild fat depletion, mild muscle depletion, energy intake < or equal to 75% for > or equal to 1 month, percent weight loss.  GOAL:   Patient will meet greater than or equal to 90% of their needs  MONITOR:   Diet advancement  REASON FOR ASSESSMENT:   Malnutrition Screening Tool    ASSESSMENT:   73 year old female with history of bipolar disorder, hypertension, A-fib currently not on anticoagulation, GERD, morbid obesity, breast cancer on oral chemotherapy, anxiety disorder who presented from oncology office for the evaluation of anemia. Admitted for GI bleed.  Patient in room, sitting on bedside commode. Husband at bedside. Pt reports feeling hungry and wishes she could eat. Awaiting GI consult given GI bleed. Pt states she doesn't eat but 2 meals  a day on a good day. On bad appetite days she eats a small snack of cheese. Tries to drink Boost but at most can drink 1/2 of a bottle each day. Doesn't like any flavors but chocolate. Will order once able. Reports having trouble swallowing when she was undergoing radiation but this isn't an issue recently. Reports taste changes, had thrush which was treated. Recommended mouth rinsing prior to meals.   Pt now in endoscopy for EGD, will await results.  Per weight records, pt has lost 45 lbs since 02/21/23 (22% wt loss x 5.5 months , significant for time frame).  Medications: Zofran   Labs reviewed: Low folate   NUTRITION - FOCUSED PHYSICAL EXAM:  Flowsheet Row Most Recent Value  Orbital Region Mild depletion  Upper Arm Region  Unable to assess  Thoracic and Lumbar Region Unable to assess  Buccal Region Mild depletion  Temple Region Mild depletion  Clavicle Bone Region Mild depletion  Clavicle and Acromion Bone Region Mild depletion  Scapular Bone Region No depletion  Dorsal Hand Unable to assess  Patellar Region Unable to assess  Anterior Thigh Region Unable to assess  Posterior Calf Region Unable to assess  Edema (RD Assessment) None  Hair Reviewed  Eyes Reviewed  Mouth Reviewed  Skin Reviewed  Nails Reviewed       Diet Order:   Diet Order             Diet NPO time specified  Diet effective now                   EDUCATION NEEDS:   Education needs have been addressed  Skin:  Skin Assessment: Reviewed RN Assessment  Last BM:  2/7 -type 1  Height:   Ht Readings from Last 1 Encounters:  08/17/23 5' 4 (1.626 m)    Weight:   Wt Readings from Last 1 Encounters:  08/17/23 72 kg    BMI:  Body mass index is 27.25 kg/m.  Estimated Nutritional Needs:   Kcal:  1800-2000  Protein:  100-110g  Fluid:  2L/day   Morna Lee, MS, RD, LDN Inpatient Clinical Dietitian Contact via Secure chat

## 2023-08-17 NOTE — Progress Notes (Signed)
 PROGRESS NOTE  Doris Smith  FMW:[id number] DOB: [birth year] DOA: 08/16/2023 PCP: [name], FNP   Brief Narrative: Patient is a 73 year old female with history of bipolar disorder, hypertension, A-fib currently not on anticoagulation, GERD, morbid obesity, breast cancer on oral chemotherapy, anxiety disorder who presented from oncology office for the evaluation of anemia with hemoglobin of 6.  Report of dark tarry stools at home.  She was asymptomatic, hemodynamically stable on presentation.  GI consulted for the suspicion of GI bleed.  Ordered blood transfusion.  Plan for EGD  Assessment & Plan:  Principal Problem:   Melena Active Problems:   Bipolar disorder (HCC)   Essential hypertension   Atrial fibrillation (HCC)   GERD (gastroesophageal reflux disease)   Obesity (BMI 30-39.9)   Malignant neoplasm of upper-outer quadrant of right breast in female, estrogen receptor positive (HCC)   Anxiety associated with depression   Anemia associated with acute blood loss  Suspected upper GI bleed: Report of dark tarry stools at home.   GI consulted.  Started on Protonix  iv  Pancytopenia/acute blood loss anemia: Hemoglobin in the range of 4 this morning.  Given 2 units of PRBC.  Currently hemodynamically stable.  Will check iron studies, vitamin B12, folic acid  level.  Her pancytopenia is most likely secondary to her history of breast cancer.  Has macrocytosis.  Posttransfusion, hemoglobin stable in the range of 8.  No new episode of hematochezia or melena.  No abdominal pain.  She is not taking Eliquis  for last few months  History of  right breast cancer: Follows with oncology, on oral chemotherapy Ibrance  which has been currently on hold  Paroxysmal A-fib: Previously on Eliquis  for anticoagulation, currently not taking as per her oncology.   Currently not taking any rate control medications at home.  Currently normal rhythm  Anxiety disorder/bipolar disorder: Will resume home  medications ondischarge  Hypertension: Not taking any medications at home.  Blood pressure stable         DVT prophylaxis:SCDs Start: 08/16/23 2326     Code Status: Full Code  Family Communication: Husband at bedside  Patient status:Inpatient  Patient is from :home  Anticipated discharge un:ynfz  Estimated DC date: After full workup, likely tomorrow   Consultants: GI  Procedures: Plan for EGD  Antimicrobials:  Anti-infectives (From admission, onward)    None       Subjective: Patient seen and examined at bedside today.  Hemodynamically stable.  Sitting in chair.  Asking for ice chips.  Denies any abdomen pain, nausea or vomiting or new pressure of hematochezia remainder.  Objective: Vitals:   08/17/23 0314 08/17/23 0410 08/17/23 0426 08/17/23 0540  BP: (!) 97/34 (!) 95/54 (!) 113/56   Pulse: 80 76 77 69  Resp: 17 15 16 19   Temp: 98.4 F (36.9 C) 98.2 F (36.8 C) 98.5 F (36.9 C) 98.5 F (36.9 C)  TempSrc: Oral Oral Oral Oral  SpO2: 98% 99% 100% 100%  Weight:      Height:        Intake/Output Summary (Last 24 hours) at 08/17/2023 0801 Last data filed at 08/17/2023 0600 Gross per 24 hour  Intake 1613.54 ml  Output 200 ml  Net 1413.54 ml   Filed Weights   08/16/23 1950 08/16/23 2322  Weight: 75.8 kg 72.4 kg    Examination:  General exam: Overall comfortable, not in distress HEENT: PERRL Respiratory system:  no wheezes or crackles  Cardiovascular system: S1 & S2 heard, RRR.  Gastrointestinal system: Abdomen is  nondistended, soft and nontender. Central nervous system: Alert and oriented Extremities: No edema, no clubbing ,no cyanosis Skin: No rashes, no ulcers,no icterus     Data Reviewed: I have personally reviewed following labs and imaging studies  CBC: Recent Labs  Lab 08/14/23 1202 08/16/23 2130 08/17/23 0147  WBC 2.7* 3.5* 2.8*  NEUTROABS 2.3 3.0  --   HGB 6.0* 5.3* 4.5*  HCT 19.4* 17.7* 15.3*  MCV 105.4* 107.9* 107.7*  PLT  160 140* 114*   Basic Metabolic Panel: Recent Labs  Lab 08/14/23 1202 08/16/23 2130  NA 139 138  K 3.5 3.6  CL 105 105  CO2 29 25  GLUCOSE 168* 159*  BUN 9 14  CREATININE 0.54 0.79  CALCIUM 8.6* 8.5*     No results found for this or any previous visit (from the past 240 hours).   Radiology Studies: No results found.  Scheduled Meds:  lidocaine   1-2 patch Transdermal Once   pantoprazole  (PROTONIX ) IV  40 mg Intravenous Q12H   Continuous Infusions:  dextrose  5% lactated ringers  100 mL/hr at 08/17/23 0600     LOS: 1 day   Ivonne Mustache, MD [facility] P2/01/2024, 8:01 AM

## 2023-08-17 NOTE — Anesthesia Postprocedure Evaluation (Signed)
 Anesthesia Post Note  Patient: Doris Smith  Procedure(s) Performed: ESOPHAGOGASTRODUODENOSCOPY (EGD) WITH PROPOFOL  HOT HEMOSTASIS (ARGON PLASMA COAGULATION/BICAP) BIOPSY     Patient location during evaluation: Endoscopy Anesthesia Type: MAC Level of consciousness: awake and alert Pain management: pain level controlled Vital Signs Assessment: post-procedure vital signs reviewed and stable Respiratory status: spontaneous breathing, nonlabored ventilation, respiratory function stable and patient connected to nasal cannula oxygen  Cardiovascular status: blood pressure returned to baseline and stable Postop Assessment: no apparent nausea or vomiting Anesthetic complications: no   No notable events documented.  Last Vitals:  Vitals:   08/17/23 1450 08/17/23 1503  BP: (!) 128/56 (!) 137/55  Pulse: 97 (!) 101  Resp: 18 16  Temp:    SpO2: 93% 99%    Last Pain:  Vitals:   08/17/23 1503  TempSrc:   PainSc: 0-No pain                 Garnette DELENA Gab

## 2023-08-17 NOTE — Consult Note (Addendum)
 Consultation  Referring Provider:     TRH, Dr. Loretha Primary Care Physician:  [name], FNP Primary Gastroenterologist:        Shila Reason for Consultation:     Reported melena and decreased hemoglobin     Impression / Plan:   Pancytopenia with significantly decreased hemoglobin in the setting of palbociclib  for stage IV metastatic breast cancer  Dark stools question of melena  History of atrial fibrillation, was on Eliquis , Eliquis  is still on medication list but not in the patient's medication bag and she and husband says she has not taken it in months.  ----------------------------------------------------------------------------------------------------       Her stools are dark, they do not look like classic melena.  Her anemia is at least in part due to bone marrow suppression from palbociclib .  Question if there is concomitant GI bleeding.  BUN and creatinine are normal which goes against as well.   We did discuss possible EGD.  She is ambivalent but willing to do it.  Full discussion of risks benefits and indication was undertaken.  Await Hemoccult test.  Lupita CHARLENA Commander, MD, [facility] Gastroenterology See TRACEY on call - gastroenterology for best contact person 08/17/2023 10:52 AM  1:11 PM  Stool is heme + - will do EGD    HPI:   Doris Smith is a 73 y.o. female with a history of stage IV breast cancer metastatic to the bone, who was admitted after decreased hemoglobin and then subsequent complaints of melena with dark black stools.  She shows me a photo of large stool that is dark.  She is not on oral iron therapy she did eat some greens within the last week.  She was in the clinic for oncology follow-up and her hemoglobin was 6.0, down from 9.64-month ago.  Her white count went from 5.6-2.7.  Platelets were 160.  She was squeezed extensively and denied any melena that day.  However after leaving the clinic she had some dark stools that she  thought were black and called in and was directed to the emergency department.  Hemoglobin 5.3 white count 3.5 and now platelets low at 140.  Some weakness and fatigue.  No bright red blood per rectum.  Mild chronic recurrent abdominal pain.  She takes generic Nexium  regularly to control heartburn symptoms.  She does have intermittent dry heaves.  She had restarted palbociclib  in the fall 2024.  There was a history of a questionable allergic reaction.  She was receiving dose adjustment because of decreased blood counts.  She was transfused 2 units of packed red cells and hemoglobin is now 8.3.  She feels a bit more energetic.   She does not use any NSAIDs only acetaminophen  at times.  She is on chronic narcotics and a Lidoderm  pain patch.  Though it is consistently and persistently listed as a medication of record in her outpatient medications, the patient denies using Eliquis  for 2 months or so.  She says it was stopped by her oncologist.  It is not in her current medication bag, either.  Husband corroborates.   EGD 06/26/17 - Esophagogastric landmarks were identified: the Z-line was found at 33 cm and the site of hiatal narrowing was found at 40 cm from the incisors. No evidence of esophageal stricture Findings: - A large hiatal hernia was found. The proximal extent of the gastric folds (end of tubular esophagus) was 33 cm from the incisors. The hiatal narrowing was 40 cm from the  incisors. The Z-line was 33 cm from the incisors. - The exam of the stomach was otherwise normal. - The examined duodenum was normal.  Colonoscopy 06/17/08 - Normal including random bxs  Past Medical History:  Diagnosis Date  . A-fib (HCC) 11/2012   PAF  . Anxiety    takes Ativan   . Asthma 04/07/2011   dx  . Bipolar disorder (HCC)   . Cancer [facility])    Right breast  . Depression   . Early cataracts, bilateral   . Fatty liver   . Fibromyalgia   . GERD (gastroesophageal reflux disease)   . Irritable bowel  syndrome   . Mental disorder    dx bipolar  . PONV (postoperative nausea and vomiting)   . Sleep apnea 04/2011   does not wear CPAP  . Tardive dyskinesia     Past Surgical History:  Procedure Laterality Date  . ABDOMINAL HYSTERECTOMY    . COLONOSCOPY    . DILATION AND CURETTAGE OF UTERUS  1981   abnormal pap  . KNEE ARTHROSCOPY Left 2007   x 2  . LUMBAR LAMINECTOMY/DECOMPRESSION MICRODISCECTOMY  05/31/2011   Procedure: LUMBAR LAMINECTOMY/DECOMPRESSION MICRODISCECTOMY;  Surgeon: Reyes JAYSON Billing;  Location: [facility] ORS;  Service: Orthopedics;  Laterality: Left;  Decompression Lumbar four to five and  Lumbar five to Sacral one on Left  (X-Ray)  . MASTECTOMY W/ SENTINEL NODE BIOPSY Right 09/16/2018   Procedure: RIGHT MASTECTOMY WITH RIGHT AXILLARY SENTINEL LYMPH NODE BIOPSY;  Surgeon: Ethyl Lenis, MD;  Location: [facility] OR;  Service: General;  Laterality: Right;  . PARTIAL HYSTERECTOMY  1982  . PORTACATH PLACEMENT Left 10/31/2018   Procedure: INSERTION PORT-A-CATH WITH ULTRASOUND;  Surgeon: Ethyl Lenis, MD;  Location: [facility];  Service: General;  Laterality: Left;  . TONSILLECTOMY     as child  . TOTAL KNEE ARTHROPLASTY Left 12/18/2005    Family History  Problem Relation Age of Onset  . Anesthesia problems Mother   . Heart disease Mother        CHF, atrial fib  . Heart failure Mother   . Anesthesia problems Sister   . Colon polyps Father   . Heart disease Father        Fluid around the heart  . Hypertension Sister   . Breast cancer Maternal Aunt   . Colon cancer Maternal Aunt   . Prostate cancer Neg Hx   . Ovarian cancer Neg Hx     Social History   Tobacco Use  . Smoking status: Never  . Smokeless tobacco: Never  Vaping Use  . Vaping status: Never Used  Substance Use Topics  . Alcohol use: No  . Drug use: No    Prior to Admission medications   Medication Sig Start Date End Date Taking? Authorizing Provider  esomeprazole  (NEXIUM ) 40 MG capsule Take 1  capsule (40 mg total) by mouth daily. 07/17/23  Yes Pickenpack-Cousar, Fannie SAILOR, NP  lidocaine  (LIDODERM ) 5 % Place 2 patches onto the skin daily. Remove & Discard patch within 12 hours or as directed by MD 06/08/23  Yes Pickenpack-Cousar, Fannie SAILOR, NP  LORazepam  (ATIVAN ) 1 MG tablet Take 1 tablet (1 mg total) by mouth 2 (two) times daily as needed for anxiety. 05/24/23  Yes Martin, [name], FNP  ondansetron  (ZOFRAN ) 8 MG tablet Take 1 tablet (8 mg total) by mouth every 8 (eight) hours as needed for nausea or vomiting. 05/03/23  Yes Martin, [name], FNP  oxyCODONE  (OXYCONTIN ) 15 mg 12 hr tablet Take  1 tablet (15 mg total) by mouth every 12 (twelve) hours. 07/24/23  Yes Pickenpack-Cousar, Fannie SAILOR, NP  oxyCODONE -acetaminophen  (PERCOCET) 10-325 MG tablet Take 1 tablet by mouth every 4 (four) hours as needed for pain. 06/25/23  Yes Pickenpack-Cousar, Fannie SAILOR, NP  pramipexole  (MIRAPEX ) 1 MG tablet TAKE 1 TABLET BY MOUTH THREE TIMES DAILY 08/10/23  Yes Gladis, [name], FNP  prochlorperazine  (COMPAZINE ) 10 MG tablet Take 1 tablet (10 mg total) by mouth every 6 (six) hours as needed for nausea or vomiting. 08/02/23  Yes Pickenpack-Cousar, Fannie SAILOR, NP  Zoledronic  Acid (ZOMETA  IV) Inject into the vein.   Yes [provider]  apixaban  (ELIQUIS ) 5 MG TABS tablet Take 1 tablet (5 mg total) by mouth 2 (two) times daily. Patient not taking: Reported on 08/16/2023 12/29/22   [name], FNP  Incontinence Supply Disposable (CERTAINTY FITTED BRIEFS XL) MISC 1 Package by Does not apply route as needed. 11/30/22   Pickenpack-Cousar, Athena N, NP  midodrine  (PROAMATINE ) 2.5 MG tablet Take 1 tablet (2.5 mg total) by mouth 3 (three) times daily with meals. Patient not taking: Reported on 08/16/2023 12/30/21   Lavona Agent, MD  palbociclib  (IBRANCE ) 75 MG tablet Take 1 tablet (75 mg total) by mouth daily. Take for 21 days on, 7 days off, repeat every 28 days. Patient not taking: Reported on  08/16/2023 01/24/23   Iruku, Praveena, MD  polyethylene glycol (MIRALAX / GLYCOLAX) 17 g packet Take 17 g by mouth daily.    [provider]  triamcinolone  (KENALOG) 0.1 % Apply 1 application  topically daily as needed (for itching). Patient not taking: Reported on 08/16/2023 06/21/20   [provider]    Current Facility-Administered Medications  Medication Dose Route Frequency Provider Last Rate Last Admin  . 0.9 %  sodium chloride  infusion   Intravenous Continuous Jillian Buttery, MD 75 mL/hr at 08/17/23 0912 New Bag at 08/17/23 0912  . HYDROmorphone  (DILAUDID ) injection 0.5 mg  0.5 mg Intravenous Q2H PRN Jillian Buttery, MD      . lidocaine  (LIDODERM ) 5 % 1-2 patch  1-2 patch Transdermal Once Chavez, Abigail, NP   1 patch at 08/17/23 0100  . ondansetron  (ZOFRAN ) tablet 4 mg  4 mg Oral Q6H PRN Sim Emery CROME, MD       Or  . ondansetron  (ZOFRAN ) injection 4 mg  4 mg Intravenous Q6H PRN Garba, Mohammad L, MD   4 mg at 08/17/23 0606  . pantoprazole  (PROTONIX ) injection 40 mg  40 mg Intravenous Q12H Sim Emery CROME, MD   40 mg at 08/17/23 1007   Facility-Administered Medications Ordered in Other Encounters  Medication Dose Route Frequency Provider Last Rate Last Admin  . heparin  lock flush 100 unit/mL  500 Units Intravenous Once Magrinat, Sandria BROCKS, MD      . sodium chloride  flush (NS) 0.9 % injection 10 mL  10 mL Intravenous PRN Magrinat, Gustav C, MD        Allergies as of 08/16/2023 - Review Complete 08/16/2023  Allergen Reaction Noted  . Iohexol  11/06/2012  . Codeine Other (See Comments) 05/14/2008  . Palonosetron  Other (See Comments) 04/16/2019  . Prednisone  Other (See Comments) 12/18/2017  . Sulfonamide derivatives Other (See Comments) 05/14/2008  . Celecoxib Nausea And Vomiting 12/10/2015  . Sulfa antibiotics Nausea And Vomiting 09/11/2015  . Vitamin b12 Rash 11/05/2012     Review of Systems:    This is positive for those things mentioned in the HPI, also  positive for weakness, chronic bone  pain with metastatic breast cancer, ambulates using a walker or is in a wheelchair.. All other review of systems are negative.       Physical Exam:  Vital signs in last 24 hours: Temp:  [97.9 F (36.6 C)-99.2 F (37.3 C)] 98.5 F (36.9 C) (02/07 0540) Pulse Rate:  [69-118] 69 (02/07 0540) Resp:  [14-19] 19 (02/07 0540) BP: (95-122)/(34-57) 113/56 (02/07 0426) SpO2:  [95 %-100 %] 100 % (02/07 0540) Weight:  [72.4 kg-75.8 kg] 72.4 kg (02/06 2322) Last BM Date : 08/16/23  General:  Elderly white woman somewhat pale no acute distress.  She has chronic mouth movements consistent with tardive dyskinesia. Eyes:  anicteric. Lungs: Clear to auscultation bilaterally. Heart:   S1S2, no rubs, murmurs, gallops. Abdomen: Obese soft, mildly tender epigastrium, no hepatosplenomegaly, hernia, or mass and BS+.  Rectal:  Digital rectal exam in the presence of female nursing staff.  Dark brown stool does not look like classic melena though cannot rule out.  Stool sent for Hemoccult.  Extremities:   no edema Skin  fragile skin with multiple petechiae on the forearms  Neuro:  A&O x 3.  Psych:  appropriate mood and  Affect.   Data Reviewed:   LAB RESULTS: Recent Labs    08/16/23 2130 08/17/23 0147 08/17/23 0658  WBC 3.5* 2.8* 2.6*  HGB 5.3* 4.5* 8.3*  HCT 17.7* 15.3* 26.0*  PLT 140* 114* 114*   BMET Recent Labs    08/14/23 1202 08/16/23 2130 08/17/23 0658  NA 139 138 141  K 3.5 3.6 3.6  CL 105 105 107  CO2 29 25 25   GLUCOSE 168* 159* 120*  BUN 9 14 14   CREATININE 0.54 0.79 0.48  CALCIUM 8.6* 8.5* 8.4*   LFT Recent Labs    08/17/23 0658  PROT 5.2*  ALBUMIN 2.6*  AST 44*  ALT 22  ALKPHOS 205*  BILITOT 0.9   PT/INR No results for input(s): LABPROT, INR in the last 72 hours.   Thanks   LOS: 1 day   @[email or web address] Marengo  CHARLENA Commander, MD, [facility] @  08/17/2023, 10:52 AM

## 2023-08-17 NOTE — Plan of Care (Signed)

## 2023-08-17 NOTE — Transfer of Care (Signed)
 Immediate Anesthesia Transfer of Care Note  Patient: Modell Fendrick Denicola  Procedure(s) Performed: ESOPHAGOGASTRODUODENOSCOPY (EGD) WITH PROPOFOL  HOT HEMOSTASIS (ARGON PLASMA COAGULATION/BICAP) BIOPSY  Patient Location: PACU and Endoscopy Unit  Anesthesia Type:MAC  Level of Consciousness: awake and alert   Airway & Oxygen  Therapy: Patient Spontanous Breathing and Patient connected to face mask oxygen   Post-op Assessment: Report given to RN and Post -op Vital signs reviewed and stable  Post vital signs: Reviewed and stable  Last Vitals:  Vitals Value Taken Time  BP 129/38 08/17/23 1448  Temp    Pulse 92 08/17/23 1449  Resp 24 08/17/23 1449  SpO2 95 % 08/17/23 1449  Vitals shown include unfiled device data.  Last Pain:  Vitals:   08/17/23 1327  TempSrc: Temporal  PainSc: 8          Complications: No notable events documented.

## 2023-08-17 NOTE — Discharge Instructions (Addendum)

## 2023-08-17 NOTE — Progress Notes (Signed)
 Pharmacy note: Ibrance   TRH Admitter ordered Ibrance  Dr Arno Bibles contacted.  Plan: DC Ibrance  w/ Hg 4.5  Rubie Corona, Pharm.D Use secure chat for questions 08/17/2023 7:26 AM

## 2023-08-17 NOTE — Op Note (Addendum)
 [facility] Patient Name: Doris Smith Procedure Date: 08/17/2023 MRN: [id number] Attending MD: Lupita FORBES Commander , MD, [id number] Date of Birth: 1950/08/20 CSN: [id number] Age: 73 Admit Type: Inpatient Procedure:                Upper GI endoscopy Indications:              Melena Providers:                Lupita CHARLENA Commander, MD, Darleene Bare, RN, Farris Southgate,                            Technician Referring MD:              Medicines:                Monitored Anesthesia Care Complications:            No immediate complications. Estimated Blood Loss:     Estimated blood loss was minimal. Procedure:                Pre-Anesthesia Assessment:                           - Prior to the procedure, a History and Physical                            was performed, and patient medications and                            allergies were reviewed. The patient's tolerance of                            previous anesthesia was also reviewed. The risks                            and benefits of the procedure and the sedation                            options and risks were discussed with the patient.                            All questions were answered, and informed consent                            was obtained. Prior Anticoagulants: The patient has                            taken no anticoagulant or antiplatelet agents. ASA                            Grade Assessment: III - A patient with severe                            systemic disease. After reviewing the risks and  benefits, the patient was deemed in satisfactory                            condition to undergo the procedure.                           After obtaining informed consent, the endoscope was                            passed under direct vision. Throughout the                            procedure, the patient's blood pressure, pulse, and                            oxygen  saturations were monitored  continuously. The                            [id number]-H190 ([id number]) Olympus endoscope was introduced                            through the mouth, and advanced to the second part                            of duodenum. The upper GI endoscopy was somewhat                            difficult due to angulated stomach ? from hiatal                            hernia. The patient tolerated the procedure well. Scope In: Scope Out: Findings:      Multiple dispersed small erosions with stigmata of recent bleeding were       found in the gastric body. Biopsies were taken with a cold forceps for       histology. Verification of patient identification for the specimen was       done. Estimated blood loss was minimal.      Moderate gastric antral vascular ectasia was present in the prepyloric       region of the stomach. Coagulation for bleeding prevention using argon       plasma was successful. Estimated blood loss was minimal.      A large hiatal hernia was present.      The exam was otherwise without abnormality.      The cardia and gastric fundus were otherwise normal on retroflexion. Impression:               - Erosive gastropathy with stigmata of recent                            bleeding. Do not tghink these ar Cameron erosions.                            Biopsied. Possible source of melena                           -  Gastric antral vascular ectasia. Treated with                            argon plasma coagulation (APC). Possible source of                            melens                           - Large hiatal hernia. this may be distorting the                            gastric anatomy, was difficult tpo pass scope into                            distal stomach and duodenum - not like a classic                            paraesophageal hernia, but does have large hiatal                            hernia, intrathoracic and inverted stomach on CT                            PET last year.                            - The examination was otherwise normal. Moderate Sedation:      Not Applicable - Patient had care per Anesthesia. Recommendation:           - Return patient to hospital ward for ongoing care.                           - Soft diet. Serial Hgb                           - Await biopsy results. Stay on bid PPI - move to                            oral soon.                           Add sucralfate  after ablation of GAVE - consider                            repeat ablation later depending upon clinical                            course - ordinarily would be in weeks if we do. I                            ablated what I could but the positioning was  challenging - likely from hiatal hernia distortion                            effect mentioned above.                           We had to repalce IV and it is in antecubital fossa                            - she will most likely need this replaced and                            better IV access                           Check INR in AM - as per my Consult note the                            patient and husband state that she has not taken                            Eliquis  in months though it has been listed on the                            med list during that time and at admission                           Hold palbociclib  which I think has contributed to                            low Hgb (pancytopenia)                           Goals of care consult? Procedure Code(s):        --- Professional ---                           [phone], 59, Esophagogastroduodenoscopy, flexible,                            transoral; with control of bleeding, any method                           43239, Esophagogastroduodenoscopy, flexible,                            transoral; with biopsy, single or multiple Diagnosis Code(s):        --- Professional ---                           K92.2, Gastrointestinal hemorrhage,  unspecified                           K31.819, Angiodysplasia of stomach and duodenum  without bleeding                           K44.9, Diaphragmatic hernia without obstruction or                            gangrene                           K92.1, Melena (includes Hematochezia) CPT copyright 2022 [employer]. All rights reserved. The codes documented in this report are preliminary and upon coder review may  be revised to meet current compliance requirements. Lupita FORBES Commander, MD 08/17/2023 3:00:17 PM This report has been signed electronically. Number of Addenda: 0

## 2023-08-17 NOTE — Anesthesia Preprocedure Evaluation (Addendum)
 Anesthesia Evaluation  Patient identified by MRN, date of birth, ID band Patient awake    Reviewed: Allergy  & Precautions, NPO status , Patient's Chart, lab work & pertinent test results  History of Anesthesia Complications (+) PONV and history of anesthetic complications  Airway Mallampati: II  TM Distance: >3 FB Neck ROM: Full    Dental no notable dental hx. (+) Teeth Intact, Dental Advisory Given   Pulmonary asthma , sleep apnea    Pulmonary exam normal breath sounds clear to auscultation       Cardiovascular hypertension, Normal cardiovascular exam+ dysrhythmias Atrial Fibrillation  Rhythm:Regular Rate:Normal  TTE 2021 1. Normal LV function; trace AI; GLS -24.9%.   2. Left ventricular ejection fraction, by estimation, is 60 to 65%. The  left ventricle has normal function. The left ventricle has no regional  wall motion abnormalities. Left ventricular diastolic parameters were  normal.   3. Right ventricular systolic function is normal. The right ventricular  size is normal. There is normal pulmonary artery systolic pressure.   4. The mitral valve is normal in structure. Trivial mitral valve  regurgitation. No evidence of mitral stenosis.   5. The aortic valve is tricuspid. Aortic valve regurgitation is trivial.  No aortic stenosis is present.   6. The inferior vena cava is normal in size with greater than 50%  respiratory variability, suggesting right atrial pressure of 3 mmHg.     Neuro/Psych  PSYCHIATRIC DISORDERS Anxiety Depression Bipolar Disorder   negative neurological ROS     GI/Hepatic Neg liver ROS,GERD  ,,  Endo/Other  negative endocrine ROS    Renal/GU negative Renal ROS  negative genitourinary   Musculoskeletal  (+) Arthritis ,  Fibromyalgia -  Abdominal   Peds  Hematology  (+) Blood dyscrasia, anemia Lab Results      Component                Value               Date                      WBC                       3.0 (L)             08/17/2023                HGB                      8.2 (L)             08/17/2023                HCT                      25.5 (L)            08/17/2023                MCV                      95.9                08/17/2023                PLT                      112 (L)  08/17/2023              Anesthesia Other Findings 73 year old female with history of bipolar disorder, hypertension, A-fib currently not on anticoagulation, GERD, breast cancer on oral chemotherapy, anxiety disorder who presented from oncology office for the evaluation of anemia.   Reproductive/Obstetrics                             Anesthesia Physical Anesthesia Plan  ASA: 3  Anesthesia Plan: MAC   Post-op Pain Management:    Induction: Intravenous  PONV Risk Score and Plan: Propofol  infusion and Treatment may vary due to age or medical condition  Airway Management Planned: Natural Airway  Additional Equipment:   Intra-op Plan:   Post-operative Plan:   Informed Consent: I have reviewed the patients History and Physical, chart, labs and discussed the procedure including the risks, benefits and alternatives for the proposed anesthesia with the patient or authorized representative who has indicated his/her understanding and acceptance.     Dental advisory given  Plan Discussed with: CRNA  Anesthesia Plan Comments:        Anesthesia Quick Evaluation

## 2023-08-17 NOTE — Progress Notes (Signed)
   08/17/23 1554  TOC Brief Assessment  Insurance and Status Reviewed  Patient has primary care physician Yes (: Gladis, [name], FNP)  Home environment has been reviewed Yes (home alone)  Prior level of function: Independent  Prior/Current Home Services No current home services  Social Drivers of Health Review SDOH reviewed no interventions necessary  Readmission risk has been reviewed Yes  Transition of care needs no transition of care needs at this time

## 2023-08-18 DIAGNOSIS — K921 Melena: Secondary | ICD-10-CM | POA: Diagnosis not present

## 2023-08-18 DIAGNOSIS — D649 Anemia, unspecified: Secondary | ICD-10-CM

## 2023-08-18 DIAGNOSIS — K259 Gastric ulcer, unspecified as acute or chronic, without hemorrhage or perforation: Secondary | ICD-10-CM | POA: Diagnosis not present

## 2023-08-18 DIAGNOSIS — K31819 Angiodysplasia of stomach and duodenum without bleeding: Secondary | ICD-10-CM | POA: Diagnosis not present

## 2023-08-18 DIAGNOSIS — D61810 Antineoplastic chemotherapy induced pancytopenia: Secondary | ICD-10-CM | POA: Diagnosis not present

## 2023-08-18 LAB — CBC
HCT: 25.9 % — ABNORMAL LOW (ref 36.0–46.0)
HCT: 28.6 % — ABNORMAL LOW (ref 36.0–46.0)
Hemoglobin: 8.2 g/dL — ABNORMAL LOW (ref 12.0–15.0)
Hemoglobin: 9.2 g/dL — ABNORMAL LOW (ref 12.0–15.0)
MCH: 30.5 pg (ref 26.0–34.0)
MCH: 30.2 pg (ref 26.0–34.0)
MCHC: 31.7 g/dL (ref 30.0–36.0)
MCHC: 32.2 g/dL (ref 30.0–36.0)
MCV: 96.3 fL (ref 80.0–100.0)
MCV: 93.8 fL (ref 80.0–100.0)
Platelets: 108 10*3/uL — ABNORMAL LOW (ref 150–400)
Platelets: 91 10*3/uL — ABNORMAL LOW (ref 150–400)
RBC: 2.69 MIL/uL — ABNORMAL LOW (ref 3.87–5.11)
RBC: 3.05 MIL/uL — ABNORMAL LOW (ref 3.87–5.11)
RDW: 20.4 % — ABNORMAL HIGH (ref 11.5–15.5)
RDW: 20.1 % — ABNORMAL HIGH (ref 11.5–15.5)
WBC: 3.2 10*3/uL — ABNORMAL LOW (ref 4.0–10.5)
WBC: 4.5 10*3/uL (ref 4.0–10.5)
nRBC: 0.9 % — ABNORMAL HIGH (ref 0.0–0.2)
nRBC: 1.3 % — ABNORMAL HIGH (ref 0.0–0.2)

## 2023-08-18 LAB — PROTIME-INR
INR: 1.2 (ref 0.8–1.2)
Prothrombin Time: 15.4 s — ABNORMAL HIGH (ref 11.4–15.2)

## 2023-08-18 MED ORDER — SIMETHICONE 80 MG PO CHEW
80.0000 mg | CHEWABLE_TABLET | Freq: Four times a day (QID) | ORAL | Status: DC | PRN
  Administered 2023-08-18: 80 mg via ORAL
  Filled 2023-08-18: qty 1

## 2023-08-18 MED ORDER — FOLIC ACID 1 MG PO TABS
1.0000 mg | ORAL_TABLET | Freq: Every day | ORAL | Status: DC
  Administered 2023-08-19: 1 mg via ORAL
  Filled 2023-08-18: qty 1

## 2023-08-18 MED ORDER — PRAMIPEXOLE DIHYDROCHLORIDE 1 MG PO TABS
1.0000 mg | ORAL_TABLET | Freq: Three times a day (TID) | ORAL | Status: DC | PRN
  Administered 2023-08-18: 1 mg via ORAL
  Filled 2023-08-18 (×2): qty 1

## 2023-08-18 MED ORDER — LIDOCAINE 5 % EX PTCH
1.0000 | MEDICATED_PATCH | CUTANEOUS | Status: AC
  Administered 2023-08-18: 1 via TRANSDERMAL
  Filled 2023-08-18: qty 1

## 2023-08-18 MED ORDER — LORAZEPAM 1 MG PO TABS
1.0000 mg | ORAL_TABLET | Freq: Once | ORAL | Status: AC
  Administered 2023-08-18: 1 mg via ORAL
  Filled 2023-08-18: qty 1

## 2023-08-18 MED ORDER — FOLIC ACID 5 MG/ML IJ SOLN
1.0000 mg | Freq: Once | INTRAMUSCULAR | Status: AC
  Administered 2023-08-18: 1 mg via INTRAVENOUS
  Filled 2023-08-18: qty 0.2

## 2023-08-18 MED ORDER — VITAMIN B-12 1000 MCG PO TABS
1000.0000 ug | ORAL_TABLET | Freq: Every day | ORAL | Status: DC
  Administered 2023-08-18 – 2023-08-19 (×2): 1000 ug via ORAL
  Filled 2023-08-18 (×2): qty 1

## 2023-08-18 MED ORDER — PANTOPRAZOLE SODIUM 40 MG PO TBEC
40.0000 mg | DELAYED_RELEASE_TABLET | Freq: Two times a day (BID) | ORAL | Status: DC
  Administered 2023-08-18 – 2023-08-19 (×2): 40 mg via ORAL
  Filled 2023-08-18 (×2): qty 1

## 2023-08-18 NOTE — Progress Notes (Signed)
 PROGRESS NOTE  Doris Smith  FMW:[id number] DOB: 1950/10/15 DOA: 08/16/2023 PCP: [name], FNP   Brief Narrative: Patient is a 73 year old female with history of bipolar disorder, hypertension, A-fib currently not on anticoagulation, GERD, morbid obesity, breast cancer on oral chemotherapy, anxiety disorder who presented from oncology office for the evaluation of anemia with hemoglobin of 6.  Report of dark tarry stools at home.  She was asymptomatic, hemodynamically stable on presentation.  GI consulted for the suspicion of GI bleed.  Status post blood transfusions.  EGD done on 2/7 showed gastric erosions, GAVE  Assessment & Plan:  Principal Problem:   Melena Active Problems:   Bipolar disorder (HCC)   Essential hypertension   Atrial fibrillation (HCC)   GERD (gastroesophageal reflux disease)   Obesity (BMI 30-39.9)   Malignant neoplasm of upper-outer quadrant of right breast in female, estrogen receptor positive (HCC)   Anxiety associated with depression   Anemia associated with acute blood loss   Malnutrition of moderate degree   GAVE (gastric antral vascular ectasia)   Gastric erosion  Suspected upper GI bleed: Report of dark tarry stools at home.   GI consulted.  Started on Protonix  iv. Status post EGD done on 2/7 showed gastric erosions, GAVE.  Continue Protonix , Carafate .  Pancytopenia/acute blood loss anemia:   Given 2 units of PRBC after admission.  Currently hemodynamically stable.  Her pancytopenia is most likely secondary to her history of breast cancer.  Has macrocytosis.  Hemoglobin today in the range of 7.  No new episode of hematochezia or melena.  No abdominal pain.  She is not taking Eliquis  for last few months.  Low normal vitamin B12, will start on supplementation.  Low folic acid  level, normal iron level. Continue folic acid  supplementation  History of  right breast cancer: Follows with oncology, on oral chemotherapy Ibrance  which has been  currently on hold  Paroxysmal A-fib: Previously on Eliquis  for anticoagulation, currently not taking as per her oncology.   Currently not taking any rate control medications at home.  Currently normal rhythm  Anxiety disorder/bipolar disorder: Will resume home medications on discharge  Hypertension: Not taking any medications at home.  Blood pressure stable    Nutrition Problem: Moderate Malnutrition Etiology: cancer and cancer related treatments, chronic illness    DVT prophylaxis:SCDs Start: 08/16/23 2326     Code Status: Full Code  Family Communication: Husband at bedside on 2/8  Patient status:Inpatient  Patient is from :home  Anticipated discharge un:ynfz  Estimated DC date: Likely tomorrow if hemoglobin is stable   Consultants: GI  Procedures: Plan for EGD  Antimicrobials:  Anti-infectives (From admission, onward)    None       Subjective: Patient seen and examined at bedside today.  Sitting in chair.  Appears comfortable.  No new episode of hematochezia or melena.  Complains of some funny feeling in the stomach but denies any abdomen pain, nausea or vomiting.  Objective: Vitals:   08/17/23 1503 08/17/23 1946 08/18/23 0308 08/18/23 0524  BP: (!) 137/55 (!) 113/43 (!) 136/52 (!) 128/52  Pulse: (!) 101 83 73 73  Resp: 16 16 16 16   Temp:  98.1 F (36.7 C) 98.4 F (36.9 C) 98.2 F (36.8 C)  TempSrc:  Oral Oral Oral  SpO2: 99% 96% 97% 97%  Weight:      Height:        Intake/Output Summary (Last 24 hours) at 08/18/2023 1110 Last data filed at 08/18/2023 0700 Gross per 24 hour  Intake 1359.89 ml  Output 450 ml  Net 909.89 ml   Filed Weights   08/16/23 1950 08/16/23 2322 08/17/23 1327  Weight: 75.8 kg 72.4 kg 72 kg    Examination:  General exam: Overall comfortable, not in distress HEENT: PERRL Respiratory system:  no wheezes or crackles  Cardiovascular system: S1 & S2 heard, RRR.  Gastrointestinal system: Abdomen is nondistended, soft and  nontender. Central nervous system: Alert and oriented Extremities: No edema, no clubbing ,no cyanosis Skin: No rashes, no ulcers,no icterus     Data Reviewed: I have personally reviewed following labs and imaging studies  CBC: Recent Labs  Lab 08/14/23 1202 08/16/23 2130 08/17/23 0147 08/17/23 0658 08/17/23 1242 08/17/23 2134 08/18/23 0819  WBC 2.7* 3.5* 2.8* 2.6* 3.0* 3.1* 3.2*  NEUTROABS 2.3 3.0  --   --   --   --   --   HGB 6.0* 5.3* 4.5* 8.3* 8.2* 7.6* 8.2*  HCT 19.4* 17.7* 15.3* 26.0* 25.5* 23.5* 25.9*  MCV 105.4* 107.9* 107.7* 95.6 95.9 92.9 96.3  PLT 160 140* 114* 114* 112* PLATELET CLUMPS NOTED ON SMEAR, COUNT APPEARS ADEQUATE 108*   Basic Metabolic Panel: Recent Labs  Lab 08/14/23 1202 08/16/23 2130 08/17/23 0658  NA 139 138 141  K 3.5 3.6 3.6  CL 105 105 107  CO2 29 25 25   GLUCOSE 168* 159* 120*  BUN 9 14 14   CREATININE 0.54 0.79 0.48  CALCIUM 8.6* 8.5* 8.4*     No results found for this or any previous visit (from the past 240 hours).   Radiology Studies: No results found.  Scheduled Meds:  pantoprazole   40 mg Oral BID AC   sucralfate   1 g Oral TID WC & HS   Continuous Infusions:     LOS: 2 days   Ivonne Mustache, MD [facility] P2/02/2024, 11:10 AM

## 2023-08-18 NOTE — Progress Notes (Signed)
   Patient Name: Doris Smith Date of Encounter: 08/18/2023, 9:19 AM     Assessment and Plan  Multifactorial anemia w/ decreased Hgb (5.3) - improved after transfusion   Pancytopenia from Ibrance  (? PLT clumping)   GI bleeding - gastric erosions and GAVE  Large Hiatal hernia - may be cause of dry heaves  Afib - OFF Eliquis   St 4 Breast cancer, bony mets  -------------------------------------------------------------------------------------------  Continue PPI (oral now) bid Sucralfate   Serial Hgb  Reassess tomorrow re: disposition   Subjective  Abdominal pain last night, better this AM, tol diet no problem Had an episode of bloatng and dry heaves (chronic issue Just had formed brown stool   Objective  BP (!) 128/52 (BP Location: Left Arm)   Pulse 73   Temp 98.2 F (36.8 C) (Oral)   Resp 16   Ht 5' 4 (1.626 m)   Wt 72 kg   SpO2 97%   BMI 27.25 kg/m  Elderl ww NAD Abd obese soft and nontender   EGD 08/17/23  Multiple gastric erosions - biopsied GAVE - ablated (some) APV Large Hiatal Hernia   Recent Labs  Lab 08/17/23 1242 08/17/23 2134 08/18/23 0819  HGB 8.2* 7.6* 8.2*  HCT 25.5* 23.5* 25.9*  WBC 3.0* 3.1* 3.2*  PLT 112* PLATELET CLUMPS NOTED ON SMEAR, COUNT APPEARS ADEQUATE 108*   Recent Labs  Lab 08/14/23 1202 08/16/23 2130 08/17/23 0658  NA 139 138 141  K 3.5 3.6 3.6  CL 105 105 107  CO2 29 25 25   GLUCOSE 168* 159* 120*  BUN 9 14 14   CREATININE 0.54 0.79 0.48  CALCIUM 8.6* 8.5* 8.4*   Lab Results  Component Value Date   INR 1.2 08/18/2023   INR 1.3 (H) 10/25/2022   INR 1.05 05/25/2011      Lupita CHARLENA Commander, MD, [facility] See TRACEY on call - gastroenterology for best contact person 08/18/2023 9:19 AM

## 2023-08-18 NOTE — Plan of Care (Signed)

## 2023-08-19 ENCOUNTER — Encounter: Payer: Self-pay | Admitting: Hematology and Oncology

## 2023-08-19 DIAGNOSIS — K31819 Angiodysplasia of stomach and duodenum without bleeding: Secondary | ICD-10-CM | POA: Diagnosis not present

## 2023-08-19 DIAGNOSIS — D61810 Antineoplastic chemotherapy induced pancytopenia: Secondary | ICD-10-CM | POA: Diagnosis not present

## 2023-08-19 DIAGNOSIS — K259 Gastric ulcer, unspecified as acute or chronic, without hemorrhage or perforation: Secondary | ICD-10-CM | POA: Diagnosis not present

## 2023-08-19 DIAGNOSIS — K921 Melena: Secondary | ICD-10-CM | POA: Diagnosis not present

## 2023-08-19 DIAGNOSIS — D649 Anemia, unspecified: Secondary | ICD-10-CM | POA: Diagnosis not present

## 2023-08-19 LAB — CBC
HCT: 28.3 % — ABNORMAL LOW (ref 36.0–46.0)
Hemoglobin: 8.6 g/dL — ABNORMAL LOW (ref 12.0–15.0)
MCH: 30.1 pg (ref 26.0–34.0)
MCHC: 30.4 g/dL (ref 30.0–36.0)
MCV: 99 fL (ref 80.0–100.0)
Platelets: 101 10*3/uL — ABNORMAL LOW (ref 150–400)
RBC: 2.86 MIL/uL — ABNORMAL LOW (ref 3.87–5.11)
RDW: 20.4 % — ABNORMAL HIGH (ref 11.5–15.5)
WBC: 3.3 10*3/uL — ABNORMAL LOW (ref 4.0–10.5)
nRBC: 0.9 % — ABNORMAL HIGH (ref 0.0–0.2)

## 2023-08-19 MED ORDER — PHENOL 1.4 % MT LIQD
1.0000 | OROMUCOSAL | Status: DC | PRN
  Administered 2023-08-19: 1 via OROMUCOSAL
  Filled 2023-08-19: qty 177

## 2023-08-19 MED ORDER — SUCRALFATE 1 G PO TABS: 1.0000 g | ORAL_TABLET | Freq: Three times a day (TID) | ORAL | 0 refills | Status: DC

## 2023-08-19 MED ORDER — CYANOCOBALAMIN 1000 MCG PO TABS: 1000.0000 ug | ORAL_TABLET | Freq: Every day | ORAL | 0 refills | Status: DC

## 2023-08-19 MED ORDER — MENTHOL 3 MG MT LOZG
1.0000 | LOZENGE | OROMUCOSAL | Status: DC | PRN
  Filled 2023-08-19: qty 9

## 2023-08-19 MED ORDER — FOLIC ACID 1 MG PO TABS: 1.0000 mg | ORAL_TABLET | Freq: Every day | ORAL | 0 refills | Status: DC

## 2023-08-19 MED ORDER — PANTOPRAZOLE SODIUM 40 MG PO TBEC: 40.0000 mg | DELAYED_RELEASE_TABLET | Freq: Two times a day (BID) | ORAL | 0 refills | Status: DC

## 2023-08-19 NOTE — Discharge Summary (Signed)
 Physician Discharge Summary  Doris Smith Reading FMW:[id number] DOB: 04-Oct-1950 DOA: 08/16/2023  PCP: [name], FNP  Admit date: 08/16/2023 Discharge date: 08/19/2023  Admitted From: Home Disposition:  Home  Discharge Condition:Stable CODE STATUS:FULL, DNR, Comfort Care Diet recommendation: Heart Healthy / Carb Modified / Regular / Dysphagia   Brief/Interim Summary: Patient is a 73 year old female with history of bipolar disorder, hypertension, A-fib currently not on anticoagulation, GERD, morbid obesity, breast cancer on oral chemotherapy, anxiety disorder who presented from oncology office for the evaluation of anemia with hemoglobin of 6.  Report of dark tarry stools at home.  She was asymptomatic, hemodynamically stable on presentation.  GI consulted for the suspicion of GI bleed.  Status post blood transfusions.  EGD done on 2/7 showed gastric erosions, GAVE .  Currently hemoglobin stable.  GI cleared for discharge.  Following problems were addressed during the hospitalization:  Suspected upper GI bleed: Report of dark tarry stools at home.   GI consulted.  Started on Protonix  iv. Status post EGD done on 2/7 showed gastric erosions, GAVE.  Continue Protonix , Carafate  on dc   Pancytopenia/acute blood loss anemia:   Given 2 units of PRBC after admission.  Currently hemodynamically stable.  Her pancytopenia cud be  secondary to her history of breast cancer.  Has macrocytosis.  Hemoglobin today in the range of 8.  No new episode of hematochezia or melena.  No abdominal pain.  She is not taking Eliquis  for last few months.  Low normal vitamin B12, will start on supplementation.  Low folic acid  level, normal iron level. Continue folic acid  supplementation   History of  right breast cancer: Follows with oncology, on oral chemotherapy Ibrance  which has been currently on hold.  We recommend to discuss with her oncologist before restarting that   Paroxysmal A-fib: Previously on Eliquis  for  anticoagulation, currently not taking as per her oncology.   Currently not taking any rate control medications at home.  Currently normal rhythm   Anxiety disorder/bipolar disorder: Continue  home medications on discharge   Hypertension: Not taking any medications at home.  Blood pressure stable  Discharge Diagnoses:  Principal Problem:   Melena Active Problems:   Bipolar disorder (HCC)   Essential hypertension   Atrial fibrillation (HCC)   GERD (gastroesophageal reflux disease)   Obesity (BMI 30-39.9)   Malignant neoplasm of upper-outer quadrant of right breast in female, estrogen receptor positive (HCC)   Anxiety associated with depression   Anemia associated with acute blood loss   Malnutrition of moderate degree   GAVE (gastric antral vascular ectasia)   Gastric erosion    Discharge Instructions  Discharge Instructions     Diet - low sodium heart healthy   Complete by: As directed    Discharge instructions   Complete by: As directed    1)Please take prescribed medications as instructed 2)Follow up with your PCP in a week.  Check  your hemoglobin during the follow-up. 3)Follow up with your oncologist soon as possible.  We are recommending to hold ibrance  until you follow-up and discuss with your oncologist   Increase activity slowly   Complete by: As directed       Allergies as of 08/19/2023       Reactions   Iohexol    over 20 years ago had reaction hurting in arm and heart-Done in [place], [place].SABRAphysician stopped CT at that time.   Codeine Other (See Comments)   Makes feel like she is wild   Palonosetron  Other (See  Comments)   headache   Prednisone  Other (See Comments)   Makes me very irritable   Sulfonamide Derivatives Other (See Comments)   Upset stomach   Celecoxib Nausea And Vomiting   Sulfa Antibiotics Nausea And Vomiting   Vitamin B12 Rash        Medication List     PAUSE taking these medications    palbociclib  75 MG tablet Wait to take this  until: August 27, 2023 Commonly known as: IBRANCE  Take 1 tablet (75 mg total) by mouth daily. Take for 21 days on, 7 days off, repeat every 28 days.       STOP taking these medications    apixaban  5 MG Tabs tablet Commonly known as: Eliquis    esomeprazole  40 MG capsule Commonly known as: NEXIUM    midodrine  2.5 MG tablet Commonly known as: PROAMATINE    triamcinolone  cream 0.1 % Commonly known as: KENALOG       TAKE these medications    Certainty Fitted Briefs XL Misc 1 Package by Does not apply route as needed.   cyanocobalamin  1000 MCG tablet Take 1 tablet (1,000 mcg total) by mouth daily.   folic acid  1 MG tablet Commonly known as: FOLVITE  Take 1 tablet (1 mg total) by mouth daily.   lidocaine  5 % Commonly known as: LIDODERM  Place 2 patches onto the skin daily. Remove & Discard patch within 12 hours or as directed by MD   LORazepam  1 MG tablet Commonly known as: ATIVAN  Take 1 tablet (1 mg total) by mouth 2 (two) times daily as needed for anxiety.   ondansetron  8 MG tablet Commonly known as: Zofran  Take 1 tablet (8 mg total) by mouth every 8 (eight) hours as needed for nausea or vomiting.   oxyCODONE -acetaminophen  10-325 MG tablet Commonly known as: Percocet Take 1 tablet by mouth every 4 (four) hours as needed for pain.   OxyCONTIN  15 MG 12 hr tablet Generic drug: oxyCODONE  Take 1 tablet (15 mg total) by mouth every 12 (twelve) hours.   pantoprazole  40 MG tablet Commonly known as: PROTONIX  Take 1 tablet (40 mg total) by mouth 2 (two) times daily before a meal.   polyethylene glycol 17 g packet Commonly known as: MIRALAX / GLYCOLAX Take 17 g by mouth daily.   pramipexole  1 MG tablet Commonly known as: MIRAPEX  TAKE 1 TABLET BY MOUTH THREE TIMES DAILY   prochlorperazine  10 MG tablet Commonly known as: COMPAZINE  Take 1 tablet (10 mg total) by mouth every 6 (six) hours as needed for nausea or vomiting.   sucralfate  1 g tablet Commonly known as:  Carafate  Take 1 tablet (1 g total) by mouth 4 (four) times daily -  with meals and at bedtime for 28 days.   ZOMETA  IV Inject into the vein.        Follow-up Information     [name], FNP. Schedule an appointment as soon as possible for a visit in 1 week(s).   Specialty: Family Medicine Contact information: [address] [place] [address] [phone]                Allergies  Allergen Reactions   Iohexol     over 20 years ago had reaction hurting in arm and heart-Done in [place], [place].SABRAphysician stopped CT at that time.   Codeine Other (See Comments)    Makes feel like she is wild    Palonosetron  Other (See Comments)    headache   Prednisone  Other (See Comments)    Makes  me very irritable   Sulfonamide Derivatives Other (See Comments)    Upset stomach   Celecoxib Nausea And Vomiting   Sulfa Antibiotics Nausea And Vomiting   Vitamin B12 Rash    Consultations: GI   Procedures/Studies: No results found.    Subjective: Patient seen and examined at bedside today.  Hemodynamically stable.  Comfortable.  Had a brown bowel movement today.  Feels better and eager to go home.  Discharge Exam: Vitals:   08/18/23 2104 08/19/23 0432  BP: (!) 144/63 (!) 109/49  Pulse: 77 80  Resp: 16 16  Temp: 98.3 F (36.8 C) 97.7 F (36.5 C)  SpO2: 97% 97%   Vitals:   08/18/23 0524 08/18/23 1338 08/18/23 2104 08/19/23 0432  BP: (!) 128/52 (!) 145/55 (!) 144/63 (!) 109/49  Pulse: 73 97 77 80  Resp: 16 18 16 16   Temp: 98.2 F (36.8 C) 99 F (37.2 C) 98.3 F (36.8 C) 97.7 F (36.5 C)  TempSrc: Oral Oral Oral Oral  SpO2: 97% 97% 97% 97%  Weight:      Height:        General: Pt is alert, awake, not in acute distress Cardiovascular: RRR, S1/S2 +, no rubs, no gallops Respiratory: CTA bilaterally, no wheezing, no rhonchi Abdominal: Soft, NT, ND, bowel sounds + Extremities: no edema, no cyanosis    The results of significant diagnostics from  this hospitalization (including imaging, microbiology, ancillary and laboratory) are listed below for reference.     Microbiology: No results found for this or any previous visit (from the past 240 hours).   Labs: BNP (last 3 results) No results for input(s): BNP in the last 8760 hours. Basic Metabolic Panel: Recent Labs  Lab 08/14/23 1202 08/16/23 2130 08/17/23 0658  NA 139 138 141  K 3.5 3.6 3.6  CL 105 105 107  CO2 29 25 25   GLUCOSE 168* 159* 120*  BUN 9 14 14   CREATININE 0.54 0.79 0.48  CALCIUM 8.6* 8.5* 8.4*   Liver Function Tests: Recent Labs  Lab 08/14/23 1202 08/16/23 2130 08/17/23 0658  AST 31 53* 44*  ALT 16 23 22   ALKPHOS 215* 214* 205*  BILITOT 0.5 1.0 0.9  PROT 5.4* 5.4* 5.2*  ALBUMIN 3.0* 2.6* 2.6*   Recent Labs  Lab 08/16/23 2130  LIPASE 21   No results for input(s): AMMONIA in the last 168 hours. CBC: Recent Labs  Lab 08/14/23 1202 08/16/23 2130 08/17/23 0147 08/17/23 1242 08/17/23 2134 08/18/23 0819 08/18/23 1930 08/19/23 0754  WBC 2.7* 3.5*   < > 3.0* 3.1* 3.2* 4.5 3.3*  NEUTROABS 2.3 3.0  --   --   --   --   --   --   HGB 6.0* 5.3*   < > 8.2* 7.6* 8.2* 9.2* 8.6*  HCT 19.4* 17.7*   < > 25.5* 23.5* 25.9* 28.6* 28.3*  MCV 105.4* 107.9*   < > 95.9 92.9 96.3 93.8 99.0  PLT 160 140*   < > 112* PLATELET CLUMPS NOTED ON SMEAR, COUNT APPEARS ADEQUATE 108* 91* 101*   < > = values in this interval not displayed.   Cardiac Enzymes: No results for input(s): CKTOTAL, CKMB, CKMBINDEX, TROPONINI in the last 168 hours. BNP: Invalid input(s): POCBNP CBG: No results for input(s): GLUCAP in the last 168 hours. D-Dimer No results for input(s): DDIMER in the last 72 hours. Hgb A1c No results for input(s): HGBA1C in the last 72 hours. Lipid Profile No results for input(s): CHOL, HDL, LDLCALC, TRIG,  CHOLHDL, LDLDIRECT in the last 72 hours. Thyroid  function studies No results for input(s): TSH, T4TOTAL, T3FREE,  THYROIDAB in the last 72 hours.  Invalid input(s): FREET3 Anemia work up Recent Labs    08/17/23 1242  VITAMINB12 228  FOLATE 5.6*  TIBC 332  IRON 120   Urinalysis    Component Value Date/Time   COLORURINE YELLOW 12/19/2018 1355   APPEARANCEUR Clear 02/03/2020 1045   LABSPEC 1.016 12/19/2018 1355   PHURINE 5.0 12/19/2018 1355   GLUCOSEU Negative 02/03/2020 1045   HGBUR NEGATIVE 12/19/2018 1355   BILIRUBINUR Negative 02/03/2020 1045   KETONESUR NEGATIVE 12/19/2018 1355   PROTEINUR Negative 02/03/2020 1045   PROTEINUR NEGATIVE 12/19/2018 1355   UROBILINOGEN negative 11/03/2014 1039   UROBILINOGEN 0.2 05/25/2011 0923   NITRITE Negative 02/03/2020 1045   NITRITE NEGATIVE 12/19/2018 1355   LEUKOCYTESUR Negative 02/03/2020 1045   LEUKOCYTESUR SMALL (A) 12/19/2018 1355   Sepsis Labs Recent Labs  Lab 08/17/23 2134 08/18/23 0819 08/18/23 1930 08/19/23 0754  WBC 3.1* 3.2* 4.5 3.3*   Microbiology No results found for this or any previous visit (from the past 240 hours).  Please note: You were cared for by a hospitalist during your hospital stay. Once you are discharged, your primary care physician will handle any further medical issues. Please note that NO REFILLS for any discharge medications will be authorized once you are discharged, as it is imperative that you return to your primary care physician (or establish a relationship with a primary care physician if you do not have one) for your post hospital discharge needs so that they can reassess your need for medications and monitor your lab values.    Time coordinating discharge: 40 minutes  SIGNED:   Ivonne Mustache, MD  [facility] 08/19/2023, 10:16 AM Pager [phone]  If 7PM-7AM, please contact night-coverage [email or web address] Password [email or web address]

## 2023-08-19 NOTE — Progress Notes (Signed)
   Patient Name: Doris Smith Date of Encounter: 08/19/2023, 10:56 AM     Assessment and Plan  Multifactorial anemia w/ decreased Hgb (5.3) - improved after transfusion   Pancytopenia from Ibrance  (? PLT clumping)   GI bleeding - gastric erosions and GAVE  Large Hiatal hernia - may be cause of dry heaves  Afib - OFF Eliquis   St 4 Breast cancer, bony mets  -------------------------------------------------------------------------------------------  Continue PPI (oral now) bid Sucralfate  Home today I will f/u pathology and coordinate GI f/u   Subjective  Improved and going home Ambulating w/ walker in hall [facility] MD is discharging her   Objective  BP (!) 109/49 (BP Location: Left Arm)   Pulse 80   Temp 97.7 F (36.5 C) (Oral)   Resp 16   Ht 5' 4 (1.626 m)   Wt 72 kg   SpO2 97%   BMI 27.25 kg/m     EGD 08/17/23  Multiple gastric erosions - biopsied GAVE - ablated (some) APV Large Hiatal Hernia   Recent Labs  Lab 08/18/23 0819 08/18/23 1930 08/19/23 0754  HGB 8.2* 9.2* 8.6*  HCT 25.9* 28.6* 28.3*  WBC 3.2* 4.5 3.3*  PLT 108* 91* 101*   Recent Labs  Lab 08/14/23 1202 08/16/23 2130 08/17/23 0658  NA 139 138 141  K 3.5 3.6 3.6  CL 105 105 107  CO2 29 25 25   GLUCOSE 168* 159* 120*  BUN 9 14 14   CREATININE 0.54 0.79 0.48  CALCIUM 8.6* 8.5* 8.4*   Lab Results  Component Value Date   INR 1.2 08/18/2023   INR 1.3 (H) 10/25/2022   INR 1.05 05/25/2011      Lupita CHARLENA Commander, MD, [facility] See TRACEY on call - gastroenterology for best contact person 08/19/2023 10:56 AM

## 2023-08-19 NOTE — Plan of Care (Signed)

## 2023-08-19 NOTE — Progress Notes (Signed)
 Reviewed discharge paperwork, medication regimen, & follow up appointments. Advised to pick up prescriptions at [facility] on [facility]. [address]. Patient and husband provided time to ask questions. Patient discharged via wheelchair to main entrance by NT.

## 2023-08-19 NOTE — Plan of Care (Signed)

## 2023-08-20 ENCOUNTER — Telehealth: Payer: Self-pay | Admitting: *Deleted

## 2023-08-20 LAB — TYPE AND SCREEN
ABO/RH(D): O POS
Antibody Screen: NEGATIVE
Unit division: 0
Unit division: 0

## 2023-08-20 LAB — BPAM RBC
Blood Product Expiration Date: 202503112359
Blood Product Expiration Date: 202503112359
ISSUE DATE / TIME: 202502070253
ISSUE DATE / TIME: 202502070400
Unit Type and Rh: 5100
Unit Type and Rh: 5100

## 2023-08-20 NOTE — Transitions of Care (Post Inpatient/ED Visit) (Signed)
   08/20/2023  Name: Doris Smith MRN: [id number] DOB: 30-Jan-1951  Today's TOC FU Call Status: Today's TOC FU Call Status:: Unsuccessful Call (1st Attempt) Unsuccessful Call (1st Attempt) Date: 08/20/23  Attempted to reach the patient regarding the most recent Inpatient/ED visit.  Follow Up Plan: Additional outreach attempts will be made to reach the patient to complete the Transitions of Care (Post Inpatient/ED visit) call.   Cecilie Coffee [facility], BSN RN Care Manager/ Transition of Care [place]/ [facility] [phone]

## 2023-08-22 ENCOUNTER — Other Ambulatory Visit: Payer: Self-pay | Admitting: Hematology and Oncology

## 2023-08-22 ENCOUNTER — Telehealth: Payer: Self-pay | Admitting: Internal Medicine

## 2023-08-22 ENCOUNTER — Telehealth: Payer: Self-pay | Admitting: *Deleted

## 2023-08-22 DIAGNOSIS — Z170 Estrogen receptor positive status [ER+]: Secondary | ICD-10-CM

## 2023-08-22 NOTE — Telephone Encounter (Signed)
Message left that I was calling about biopsy results and would try again

## 2023-08-22 NOTE — Telephone Encounter (Signed)
Good afternoon Dr. Leone Payor, I received a call from this patient stating she was returning a call from our office. Patient did stated that she is requesting a call back. Please advise.

## 2023-08-22 NOTE — Transitions of Care (Post Inpatient/ED Visit) (Signed)
   08/22/2023  Name: Doris Smith MRN: [id number] DOB: 11-12-1950  Today's TOC FU Call Status: Today's TOC FU Call Status:: Unsuccessful Call (2nd Attempt) Unsuccessful Call (2nd Attempt) Date: 08/22/23  Attempted to reach the patient regarding the most recent Inpatient/ED visit.  Follow Up Plan: Additional outreach attempts will be made to reach the patient to complete the Transitions of Care (Post Inpatient/ED visit) call.   Irving Shows [facility] BSN RN Care Manager/ Transition of Care [place]/ [facility] [phone]

## 2023-08-22 NOTE — Telephone Encounter (Signed)
Spoke to her She was aware breast cancer in gastric bxs

## 2023-08-23 ENCOUNTER — Encounter: Payer: Self-pay | Admitting: *Deleted

## 2023-08-23 ENCOUNTER — Telehealth: Payer: Self-pay | Admitting: *Deleted

## 2023-08-23 LAB — SURGICAL PATHOLOGY

## 2023-08-23 NOTE — Transitions of Care (Post Inpatient/ED Visit) (Signed)
08/23/2023  Name: Doris Smith MRN: [id number] DOB: 1951/06/11  Today's TOC FU Call Status: Today's TOC FU Call Status:: Successful TOC FU Call Completed TOC FU Call Complete Date: 08/23/23 Patient's Name and Date of Birth confirmed.  Transition Care Management Follow-up Telephone Call Date of Discharge: 08/19/23 Discharge Facility: [facility]) Type of Discharge: Inpatient Admission Primary Inpatient Discharge Diagnosis::  (pt denies any melena since hospital discharge) How have you been since you were released from the hospital?:  (pt states eating 2 meals per day, drinking mostly water, ambulating w/ walker, uses WC, eats slowly d/t dysphagia brought on by radiation- per pt in December) Any questions or concerns?: No  Items Reviewed: Did you receive and understand the discharge instructions provided?: Yes Medications obtained,verified, and reconciled?: Yes (Medications Reviewed) Any new allergies since your discharge?: No Dietary orders reviewed?: Yes Type of Diet Ordered:: dysphagia,  heart health,   carb modified Do you have support at home?: Yes People in Home: spouse Name of Support/Comfort Primary Source: [name] Nakanishi  spouse Patient declined enrollment in [facility] 30 day program Reviewed signs/ symptoms melena, reportable signs / symptoms  Medications Reviewed Today: Medications Reviewed Today     Reviewed by Audrie Gallus, RN (Registered Nurse) on 08/23/23 at 1537  Med List Status: <None>   Medication Order Taking? Sig Documenting Provider Last Dose Status Informant  cyanocobalamin 1000 MCG tablet [id number] Yes Take 1 tablet (1,000 mcg total) by mouth daily. Burnadette Pop, MD Taking Active   folic acid (FOLVITE) 1 MG tablet [id number] Yes Take 1 tablet (1 mg total) by mouth daily. Burnadette Pop, MD Taking Active   Incontinence Supply Disposable (CERTAINTY FITTED BRIEFS XL) MISC [id number] Yes 1 Package by Does not apply route as needed. Pickenpack-Cousar, Arty Baumgartner, NP Taking Active Self  lidocaine (LIDODERM) 5 % [id number] Yes Place 2 patches onto the skin daily. Remove & Discard patch within 12 hours or as directed by MD Pickenpack-Cousar, Arty Baumgartner, NP Taking Active Self  LORazepam (ATIVAN) 1 MG tablet [id number] Yes Take 1 tablet (1 mg total) by mouth 2 (two) times daily as needed for anxiety. Daphine Deutscher [name], FNP Taking Active Self  ondansetron (ZOFRAN) 8 MG tablet [id number] Yes Take 1 tablet (8 mg total) by mouth every 8 (eight) hours as needed for nausea or vomiting. Daphine Deutscher [name], FNP Taking Active Self  oxyCODONE (OXYCONTIN) 15 mg 12 hr tablet [id number] Yes Take 1 tablet (15 mg total) by mouth every 12 (twelve) hours. Pickenpack-Cousar, Arty Baumgartner, NP Taking Active Self  oxyCODONE-acetaminophen (PERCOCET) 10-325 MG tablet [id number] Yes Take 1 tablet by mouth every 4 (four) hours as needed for pain. Pickenpack-Cousar, Arty Baumgartner, NP Taking Active Self  palbociclib (IBRANCE) 75 MG tablet [id number] No Take 1 tablet (75 mg total) by mouth daily. Take for 21 days on, 7 days off, repeat every 28 days.  Patient not taking: Reported on 08/16/2023   Rachel Moulds, MD Not Taking Active Self  pantoprazole (PROTONIX) 40 MG tablet [id number] Yes Take 1 tablet (40 mg total) by mouth 2 (two) times daily before a meal. Burnadette Pop, MD Taking Active   polyethylene glycol (MIRALAX / GLYCOLAX) 17 g packet [id number] Yes Take 17 g by mouth daily. [provider] Taking Active Self  pramipexole (MIRAPEX) 1 MG tablet [id number] Yes TAKE 1 TABLET BY MOUTH THREE TIMES DAILY Daphine Deutscher, [name], FNP Taking Active Self  prochlorperazine (COMPAZINE) 10 MG tablet [id number] Yes Take 1 tablet (10 mg total) by mouth every 6 (six)  hours as needed for nausea or vomiting. Pickenpack-Cousar, Arty Baumgartner, NP Taking Active Self  sucralfate (CARAFATE) 1 g tablet [id number] Yes Take 1 tablet (1 g total) by mouth 4 (four) times daily -  with meals and at bedtime for 28 days.  Burnadette Pop, MD Taking Active   Zoledronic Acid (ZOMETA IV) [id number]  Inject into the vein. [provider]  Active Self            Home Care and Equipment/Supplies: Were Home Health Services Ordered?: No Any new equipment or medical supplies ordered?: No  Functional Questionnaire: Do you need assistance with bathing/showering or dressing?: Yes (spouse assists) Do you need assistance with meal preparation?: Yes (spouse assists) Do you need assistance with eating?: No Do you have difficulty maintaining continence: No Do you need assistance with getting out of bed/getting out of a chair/moving?: Yes (uses walker and WC) Do you have difficulty managing or taking your medications?: Yes (spouse provides oversight)  Follow up appointments reviewed: PCP Follow-up appointment confirmed?: No MD Provider Line [phone] Given:  (pt states she has labwork done w/ oncology visits, she is going to wait and follow up with PCP) Specialist Hospital Follow-up appointment confirmed?: Yes Date of Specialist follow-up appointment?: 08/28/23 Follow-Up Specialty Provider:: oncology  Praveena Iruku  @ 130 pm,   labs and injection Do you need transportation to your follow-up appointment?: No Do you understand care options if your condition(s) worsen?: Yes-patient verbalized understanding  Irving Shows [facility], BSN RN Care Manager/ Transition of Care [place]/ [facility] [phone]

## 2023-08-24 ENCOUNTER — Other Ambulatory Visit: Payer: Self-pay | Admitting: Nurse Practitioner

## 2023-08-24 ENCOUNTER — Encounter: Payer: Self-pay | Admitting: Hematology and Oncology

## 2023-08-24 ENCOUNTER — Other Ambulatory Visit (HOSPITAL_COMMUNITY): Payer: Self-pay

## 2023-08-24 DIAGNOSIS — Z170 Estrogen receptor positive status [ER+]: Secondary | ICD-10-CM

## 2023-08-24 DIAGNOSIS — G893 Neoplasm related pain (acute) (chronic): Secondary | ICD-10-CM

## 2023-08-24 DIAGNOSIS — Z515 Encounter for palliative care: Secondary | ICD-10-CM

## 2023-08-24 MED ORDER — OXYCODONE HCL ER 15 MG PO T12A
15.0000 mg | EXTENDED_RELEASE_TABLET | Freq: Two times a day (BID) | ORAL | 0 refills | Status: DC
  Filled 2023-08-24: qty 60, 30d supply, fill #0

## 2023-08-24 NOTE — Progress Notes (Signed)
 Palliative Medicine [facility]  [phone] [phone]   Name: Doris Smith Date: 08/24/2023 MRN: [id number]  DOB: Nov 06, 1950  Patient Care Team: [name], FNP as PCP - General (Family Medicine) Jodelle Gross, NP as PCP - Cardiology (Nurse Practitioner) Ovidio Kin, MD as Consulting Physician (General Surgery) Dorothy Puffer, MD as Consulting Physician (Radiation Oncology) Lysle Pearl, MD as Consulting Physician (Pulmonary Disease) Beverely Low, MD as Consulting Physician (Orthopedic Surgery) Jene Every, MD as Consulting Physician (Orthopedic Surgery) Oneta Rack, NP as Nurse Practitioner (Adult Health Nurse Practitioner) Pershing Proud, RN as Oncology Nurse Navigator Donnelly Angelica, RN as Oncology Nurse Navigator Glenna Fellows, MD as Consulting Physician (Plastic Surgery) Rollene Rotunda, MD as Consulting Physician (Cardiology) Rachel Moulds, MD as Medical Oncologist (Hematology and Oncology) Pickenpack-Cousar, Arty Baumgartner, NP as Nurse Practitioner (Hospice and Palliative Medicine)      INTERVAL HISTORY: Doris Smith is a 73 y.o. female with oncologic medical history including estrogen receptor positive breast cancer(07/2018) s/p right mastectomy. PET scan 10/06/22 showing widespread osseous metastasis. Other pertinent history includes atrial fibrillation, asthma, OSA, GERD, fatty liver, fibromyalgia, osteoporosis, chronic headaches, anxiety, depression, and bipolar disorder. Underwent ORIF of right hip 09/20/2022. Palliative ask to see for symptom and pain management and goals of care.   SOCIAL HISTORY:    Doris Smith reports that she has never smoked. She has never used smokeless tobacco. She reports that she does not drink alcohol and does not use drugs.  ADVANCE DIRECTIVES:  Advanced directives on file, Livia Snellen Flaugher, spouse, was identified as Management consultant should patient be unable to make  decision for herself   CODE STATUS: Full code  PAST MEDICAL HISTORY: Past Medical History:  Diagnosis Date   A-fib (HCC) 11/2012   PAF   Anxiety    takes Ativan   Asthma 04/07/2011   dx   Bipolar disorder (HCC)    Cancer (HCC)    Right breast   Depression    Early cataracts, bilateral    Fatty liver    Fibromyalgia    GERD (gastroesophageal reflux disease)    Irritable bowel syndrome    Mental disorder    dx bipolar   PONV (postoperative nausea and vomiting)    Sleep apnea 04/2011   does not wear CPAP   Tardive dyskinesia     ALLERGIES:  is allergic to iohexol, codeine, palonosetron, prednisone, sulfonamide derivatives, celecoxib, sulfa antibiotics, and vitamin b12.  MEDICATIONS:  Current Outpatient Medications  Medication Sig Dispense Refill   cyanocobalamin 1000 MCG tablet Take 1 tablet (1,000 mcg total) by mouth daily. 60 tablet 0   folic acid (FOLVITE) 1 MG tablet Take 1 tablet (1 mg total) by mouth daily. 60 tablet 0   Incontinence Supply Disposable (CERTAINTY FITTED BRIEFS XL) MISC 1 Package by Does not apply route as needed. 56 each 3   lidocaine (LIDODERM) 5 % Place 2 patches onto the skin daily. Remove & Discard patch within 12 hours or as directed by MD 60 patch 2   LORazepam (ATIVAN) 1 MG tablet Take 1 tablet (1 mg total) by mouth 2 (two) times daily as needed for anxiety. 60 tablet 5   ondansetron (ZOFRAN) 8 MG tablet Take 1 tablet (8 mg total) by mouth every 8 (eight) hours as needed for nausea or vomiting. 30 tablet 5   oxyCODONE (OXYCONTIN) 15 mg 12 hr tablet Take 1 tablet (15 mg total) by mouth every 12 (twelve) hours. 60  tablet 0   oxyCODONE-acetaminophen (PERCOCET) 10-325 MG tablet Take 1 tablet by mouth every 4 (four) hours as needed for pain. 60 tablet 0   [Paused] palbociclib (IBRANCE) 75 MG tablet Take 1 tablet (75 mg total) by mouth daily. Take for 21 days on, 7 days off, repeat every 28 days. (Patient not taking: Reported on 08/16/2023) 21 tablet 3    pantoprazole (PROTONIX) 40 MG tablet Take 1 tablet (40 mg total) by mouth 2 (two) times daily before a meal. 120 tablet 0   polyethylene glycol (MIRALAX / GLYCOLAX) 17 g packet Take 17 g by mouth daily.     pramipexole (MIRAPEX) 1 MG tablet TAKE 1 TABLET BY MOUTH THREE TIMES DAILY 90 tablet 0   prochlorperazine (COMPAZINE) 10 MG tablet Take 1 tablet (10 mg total) by mouth every 6 (six) hours as needed for nausea or vomiting. 30 tablet 2   sucralfate (CARAFATE) 1 g tablet Take 1 tablet (1 g total) by mouth 4 (four) times daily -  with meals and at bedtime for 28 days. 112 tablet 0   Zoledronic Acid (ZOMETA IV) Inject into the vein.     No current facility-administered medications for this visit.   Facility-Administered Medications Ordered in Other Visits  Medication Dose Route Frequency Provider Last Rate Last Admin   heparin lock flush 100 unit/mL  500 Units Intravenous Once Magrinat, Valentino Hue, MD       sodium chloride flush (NS) 0.9 % injection 10 mL  10 mL Intravenous PRN Magrinat, Valentino Hue, MD        VITAL SIGNS: There were no vitals taken for this visit. There were no vitals filed for this visit.    Estimated body mass index is 27.25 kg/m as calculated from the following:   Height as of 08/17/23: 5\' 4"  (1.626 m).   Weight as of 08/17/23: 158 lb 11.7 oz (72 kg).  Assessment NAD RRR Normal breathing pattern AAO x3  PERFORMANCE STATUS (ECOG) : 2 - Symptomatic, <50% confined to bed  Discussed the use of AI scribe software for clinical note transcription with the patient, who gave verbal consent to proceed.   IMPRESSION:  Doris Smith "Doris Smith" is a 73 year old female with breast cancer who presents with severe pain management issues and medication management. She is accompanied by her husband and sister.   She experiences nausea and uses Zofran and Compazine to manage symptoms, taking these medications multiple times a day, alternating every six to eight hours. Despite  this regimen, she continues to experience nausea and dry heaves. After extensive discussions patient is only taking Compazine once daily and her Zofran twice a day. She reports she is nauseated most mornings upon wakening. We discussed at length alternating medications in the event her nausea is not controlled. Shares anxiousness of upcoming PET scan on Tuesday. She denies concerns with constipation or diarrhea. Some ongoing fatigue, often taking naps during the day. Her husband retired 2 weeks ago and family expresses appreciation of this.   She discusses her recent hospitalization due to GI bleed, noting improved pain management due to regular medication administration by nursing staff. She expresses difficulty maintaining a consistent medication schedule at home.   She experiences severe, unbearable pain in her hip and back rated as ten out of ten on the pain scale over the past two days, with particular intensity in the morning, late afternoon, and at bedtime. Patient becomes emotional expressing her desires not to be in pain and knowing that  current regimen does work as it is the same as during the hospital. She is taking Oxycontin 15mg  every twelve-hours and a short-acting Oxycodone 10/325mg  every 4-6 hours as needed for breakthrough pain.  The patient noting increased frequency of the short-acting medication due to heightened pain. Typically, she takes Percocet once or twice a day but acknowledges the need for more frequent dosing to manage her pain effectively. She also uses lidocaine patches for pain relief and inquires about refills.  I discussed Doris Smith's current pain regimen at length. I advised that at this time no adjustments are needed as she is not optimizing current regimen, specifically only taking breakthrough 1-2 times daily. She expresses concerns of not knowing when she should be taking her Percocet. Education provided on level of comfort with awareness pain is manageable. Patient asked  about worst pain most days which she rates 10 out 10. When she takes Percocet pain will go down to 6-7 out of 10 however will increase in intensity around 4 hours. During discussions she determines a pain level of 4-5 would be a reasonable short-term comfort goal. Education provided when pain levels begin to rise to 7 or above this would be an indicator that it is time to take breakthrough if timing is appropriate. I encouraged patient and family to consider use of Percocet more than 1-2 times given patient's reports of persistent pain. She is not using a pill organizer and is having difficulty keeping up with medication schedules. I supplied patient with weekly pill organizer identifying morning, noon, midday, evening, and night times. She and family expressed appreciation.   I will continue to closely monitor pain. All questions answered and support provided.  Goals of Care  5/23: Doris Smith is emotional. Her husband becomes emotional as patient shares she knows her health is not the best and her cancer is not curable. She is taking life one day at time. Speaks to when her time comes she will be ready but hopeful it is not now. She knows that no one has a definitive time frame. Expresses her appreciation in all of the care and support. Expresses her goal is to continue to treat the treatable allowing her the ability to continue to thrive while not suffering.   4/30 ;Since the night of 10/30/22, Doris Smith has continued to meet goal of lying in her own bed at night beside her husband. She expresses appreciation for the care of everyone at the cancer center and is hopeful for continued improvement.  We discussed Her current illness and what it means in the larger context of her on-going co-morbidities. Natural disease trajectory and expectations were discussed. We discussed the importance of continued conversation with family and their medical providers regarding overall plan of care and treatment options,  ensuring decisions are within the context of the patients values and GOCs.  Assessment and Plan  Cancer-related pain Severe pain reported in the morning, afternoon, and evening. Currently on a Oxycontin and Percocet every 4-6hour as-needed, but usage has been inconsistent and pain is not well controlled. -Advise consistent use of Percocet 10/325mg  as needed for severe breakthrough pain to allow determination of regiment changes. Given pain is worst in the morning and early afternoon, advised patient to schedule a dose at 11am and 4pm daily. -Advise use of extra strength Tylenol (2 tablets) and limitations in conjunction with Percocet. -Continue Oxycontin 15mg  every 12 hours.  -Plan to reassess pain control at next visit.  Nausea Multiple episodes of nausea daily. Currently  on Zofran every 8 hours and Compazine every 6 hours. -Advise alternating Zofran and Compazine to cover periods of nausea. Not taking consistently with reports of dosing 1-2 times daily.  -Refill Zofran prescription and check if new insurance will cover more than 8 tablets.  Back pain Severe back pain reported, possibly related to cancer. Patient inquired about a back brace. -Plan to hold off on back brace until after upcoming PET scan results are available. Other interventions may be needed pending results. -Continue Lidocaine patch use.   General Health Maintenance -Refill Lidocaine patches through pharmacy. -Follow-up appointment scheduled for September 17, 2023. -Patient knows to contact office sooner.  Any controlled substances utilized were prescribed in the context of palliative care. PDMP has been reviewed.    Visit consisted of counseling and education dealing with the complex and emotionally intense issues of symptom management and palliative care in the setting of serious and potentially life-threatening illness.   Willette Alma, AGPCNP-BC  Palliative Medicine Team/[facility]

## 2023-08-27 ENCOUNTER — Telehealth: Payer: Self-pay

## 2023-08-27 NOTE — Telephone Encounter (Signed)
 Spoke with patient and confirmed appointment on 08/28/23

## 2023-08-28 ENCOUNTER — Inpatient Hospital Stay: Payer: Medicare Other | Admitting: Hematology and Oncology

## 2023-08-28 ENCOUNTER — Inpatient Hospital Stay (HOSPITAL_BASED_OUTPATIENT_CLINIC_OR_DEPARTMENT_OTHER): Payer: Medicare Other | Admitting: Nurse Practitioner

## 2023-08-28 ENCOUNTER — Encounter: Payer: Self-pay | Admitting: Nurse Practitioner

## 2023-08-28 ENCOUNTER — Inpatient Hospital Stay: Payer: Medicare Other

## 2023-08-28 ENCOUNTER — Other Ambulatory Visit: Payer: Self-pay

## 2023-08-28 VITALS — BP 103/40 | HR 106 | Temp 98.1°F | Resp 16 | Wt 167.4 lb

## 2023-08-28 DIAGNOSIS — G893 Neoplasm related pain (acute) (chronic): Secondary | ICD-10-CM

## 2023-08-28 DIAGNOSIS — D649 Anemia, unspecified: Secondary | ICD-10-CM | POA: Diagnosis not present

## 2023-08-28 DIAGNOSIS — C50411 Malignant neoplasm of upper-outer quadrant of right female breast: Secondary | ICD-10-CM

## 2023-08-28 DIAGNOSIS — R110 Nausea: Secondary | ICD-10-CM

## 2023-08-28 DIAGNOSIS — Z515 Encounter for palliative care: Secondary | ICD-10-CM | POA: Diagnosis not present

## 2023-08-28 DIAGNOSIS — C7951 Secondary malignant neoplasm of bone: Secondary | ICD-10-CM | POA: Diagnosis not present

## 2023-08-28 DIAGNOSIS — Z170 Estrogen receptor positive status [ER+]: Secondary | ICD-10-CM

## 2023-08-28 DIAGNOSIS — R530 Neoplastic (malignant) related fatigue: Secondary | ICD-10-CM

## 2023-08-28 DIAGNOSIS — R630 Anorexia: Secondary | ICD-10-CM

## 2023-08-28 DIAGNOSIS — K31819 Angiodysplasia of stomach and duodenum without bleeding: Secondary | ICD-10-CM | POA: Diagnosis not present

## 2023-08-28 DIAGNOSIS — Z79899 Other long term (current) drug therapy: Secondary | ICD-10-CM | POA: Diagnosis not present

## 2023-08-28 LAB — LIPID PANEL
Cholesterol: 149 mg/dL (ref 0–200)
HDL: 48 mg/dL (ref >40)
LDL Cholesterol: 86 mg/dL (ref 0–99)
Total CHOL/HDL Ratio: 3.1 {ratio}
Triglycerides: 76 mg/dL (ref <150)
VLDL: 15 mg/dL (ref 0–40)

## 2023-08-28 LAB — CBC WITH DIFFERENTIAL/PLATELET
Abs Immature Granulocytes: 0.02 10*3/uL (ref 0.00–0.07)
Basophils Absolute: 0 10*3/uL (ref 0.0–0.1)
Basophils Relative: 1 %
Eosinophils Absolute: 0 10*3/uL (ref 0.0–0.5)
Eosinophils Relative: 1 %
HCT: 24.9 % — ABNORMAL LOW (ref 36.0–46.0)
Hemoglobin: 8.1 g/dL — ABNORMAL LOW (ref 12.0–15.0)
Immature Granulocytes: 0 %
Lymphocytes Relative: 6 %
Lymphs Abs: 0.3 10*3/uL — ABNORMAL LOW (ref 0.7–4.0)
MCH: 30.9 pg (ref 26.0–34.0)
MCHC: 32.5 g/dL (ref 30.0–36.0)
MCV: 95 fL (ref 80.0–100.0)
Monocytes Absolute: 0.8 10*3/uL (ref 0.1–1.0)
Monocytes Relative: 14 %
Neutro Abs: 4.4 10*3/uL (ref 1.7–7.7)
Neutrophils Relative %: 78 %
Platelets: 165 10*3/uL (ref 150–400)
RBC: 2.62 MIL/uL — ABNORMAL LOW (ref 3.87–5.11)
RDW: 18.6 % — ABNORMAL HIGH (ref 11.5–15.5)
WBC: 5.6 10*3/uL (ref 4.0–10.5)
nRBC: 0.4 % — ABNORMAL HIGH (ref 0.0–0.2)

## 2023-08-28 LAB — CMP (CANCER CENTER ONLY)
ALT: 34 U/L (ref 0–44)
AST: 39 U/L (ref 15–41)
Albumin: 2.9 g/dL — ABNORMAL LOW (ref 3.5–5.0)
Alkaline Phosphatase: 196 U/L — ABNORMAL HIGH (ref 38–126)
Anion gap: 6 (ref 5–15)
BUN: 10 mg/dL (ref 8–23)
CO2: 29 mmol/L (ref 22–32)
Calcium: 8.6 mg/dL — ABNORMAL LOW (ref 8.9–10.3)
Chloride: 102 mmol/L (ref 98–111)
Creatinine: 0.45 mg/dL (ref 0.44–1.00)
GFR, Estimated: >60 mL/min (ref >60)
Glucose, Bld: 145 mg/dL — ABNORMAL HIGH (ref 70–99)
Potassium: 3.8 mmol/L (ref 3.5–5.1)
Sodium: 137 mmol/L (ref 135–145)
Total Bilirubin: 0.7 mg/dL (ref 0.0–1.2)
Total Protein: 5.5 g/dL — ABNORMAL LOW (ref 6.5–8.1)

## 2023-08-28 MED ORDER — ONDANSETRON HCL 8 MG PO TABS: 8.0000 mg | ORAL_TABLET | Freq: Three times a day (TID) | ORAL | 5 refills | Status: DC | PRN

## 2023-08-28 MED ORDER — ABEMACICLIB 50 MG PO TABS
50.0000 mg | ORAL_TABLET | Freq: Two times a day (BID) | ORAL | 1 refills | Status: DC
  Filled 2023-09-06: qty 56, 28d supply, fill #0
  Filled 2023-09-28: qty 56, 28d supply, fill #1

## 2023-08-28 NOTE — Progress Notes (Signed)
 [facility] Cancer Follow up:    Doris Pierini, FNP [address] [place] [address]   DIAGNOSIS:  Cancer Staging  Malignant neoplasm of upper-outer quadrant of right breast in female, estrogen receptor positive (HCC) Staging form: Breast, AJCC 8th Edition - Pathologic: Stage IB (pT3, pN56mi, cM0, G2, ER+, PR+, HER2-) - Signed by Loa Socks, NP on 10/02/2018 Multigene prognostic tests performed: MammaPrint Histologic grading system: 3 grade system - Clinical stage from 12/28/2022: Stage IV (rcT2, cN0, cM1, G3, ER+, PR+, HER2-) - Signed by Rachel Moulds, MD on 12/28/2022 Stage prefix: Recurrence Histologic grading system: 3 grade system Laterality: Right Staged by: Pathologist and managing physician Stage used in treatment planning: Yes National guidelines used in treatment planning: Yes Type of national guideline used in treatment planning: NCCN   SUMMARY OF ONCOLOGIC HISTORY: Oncology History  Malignant neoplasm of upper-outer quadrant of right breast in female, estrogen receptor positive (HCC)  08/08/2018 Initial Diagnosis   status post right breast biopsy 08/02/2018 for a clinical T3 N0, stage IIA invasive lobular carcinoma, grade 2, estrogen receptor strongly positive, progesterone receptor 1% positive, with no HER-2 amplification and an MIB-1 of 1%.             (a) CT scan of the head and chest, without contrast 08/23/2018 showed nonspecific 0.4 cm left lower lobe pulmonary nodule, no definitive metastatic disease             (b) bone scan 08/23/2018 shows multiple spinal areas of abnormal uptake, but             (c) total spinal MRI 09/03/2018 finds bone scan findings to be secondary to degenerative disease, no evidence of metastatic disease.   08/2018 - 10/2022 Anti-estrogen oral therapy   Anastrozole; discontinued 10/14/2018 in preparation for chemotherapy, resumed October 2020   09/16/2018 Surgery   Right mastectomy [name]  [phone]): Invasive Lobular Carcinoma, 10.5 cm, grade 2, negative margins. 1 of 5 lymph nodes positive for carcinoma.   09/16/2018 Miscellaneous   MammaPrint "high risk" suggests a 5-year metastasis free survival of 93% with chemotherapy, with an absolute chemotherapy benefit in the greater than 12% range    09/16/2018 Miscellaneous   Caris testing on mastectomy sample (09/16/2018) showed stable MSI and proficient mismatch repair status, with a low mutational burden; BRCA 1 and 2 were not mutated, PI K3 was not mutated, ER B B2 was not mutated, and AKT 1 was not mutated. 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 Administration: 0.25 mg (11/05/2018), 0.25 mg (11/26/2018), 0.25 mg (12/17/2018), 0.25 mg (01/07/2019), 0.25 mg (01/28/2019), 0.25 mg (02/18/2019), 0.25 mg (03/11/2019), 0.25 mg (04/02/2019)  methotrexate (PF) chemo injection 84 mg, 39.8 mg/m2 = 84.5 mg, Intravenous,  Once, 5 of 5 cycles. Administration: 84 mg (11/05/2018), 84 mg (11/26/2018), 84 mg (12/17/2018), 84 mg (03/11/2019), 84 mg (04/02/2019)  pegfilgrastim-cbqv (UDENYCA) injection 6 mg, 6 mg, Subcutaneous, Once, 6 of 6 cycles. Administration: 6 mg (12/19/2018)  cyclophosphamide (CYTOXAN) 1,260 mg in sodium chloride 0.9 % 250 mL chemo infusion, 600 mg/m2 = 1,260 mg, Intravenous,  Once, 8 of 8 cycle. Administration: 1,260 mg (11/05/2018), 1,260 mg (11/26/2018), 1,260 mg (12/17/2018), 1,260 mg (01/07/2019), 1,260 mg (01/28/2019), 1,260 mg (02/18/2019), 1,260 mg (03/11/2019), 1,260 mg (04/02/2019)  fluorouracil (ADRUCIL) chemo injection 1,250  mg, 600 mg/m2 = 1,250 mg, Intravenous,  Once, 8 of 8 cycles. Administration: 1,250 mg (11/05/2018), 1,250 mg (11/26/2018), 1,250 mg (12/17/2018), 1,250 mg (01/07/2019), 1,250 mg (01/28/2019), 1,250 mg (02/18/2019), 1,250 mg (03/11/2019), 1,250 mg  (04/02/2019).    12/31/2018 - 02/19/2019 Radiation Therapy   The patient initially received a dose of 50.4 Gy in 28 fractions to the chest wall and supraclavicular region. This was delivered using a 3-D conformal, 4 field technique. The patient then received a boost to the mastectomy scar. This delivered an additional 10 Gy in 5 fractions using an en face electron field. The total dose was 60.4 Gy.   09/12/2022 Imaging   Bone scan on 09/12/2022 shows uptake in ribs, manubrium. Widespread osseous metastasis confirmed on PET scan that was completed on October 06, 2022. Right iliac crest biopsy demonstrated metastatic carcinoma consistent with patient's known breast carcinoma. ER 90% positive, PR 90% positive, Ki67 10%, HER2 negative.    10/10/2022 Treatment Plan Change   Faslodex beginning 10/10/2022; Ibrance beginning 12/20/2022; Zometa every 12 weeks 11/07/2022    10/23/2022 - 11/03/2022 Radiation Therapy   Palliative Radiation 10/23/2022-11/03/2022:  left chest wall, lower T spine, and right proximal hip/pelvis were each treated to 30 GY in 10 fractions.      CURRENT THERAPY: Faslodex, Mora Appl  INTERVAL HISTORY:  Doris Smith 73 y.o. female returns for follow-up and evaluation with her sister and husband  Discussed the use of AI scribe software for clinical note transcription with the patient, who gave verbal consent to proceed  Doris Smith "Doris Smith" is a 73 year old female with breast cancer who presents with concerns about gastrointestinal bleeding and medication management. She is accompanied by her family members, including her sister.  She has a history of breast cancer and was recently hospitalized due to a major gastrointestinal bleed. An endoscopy revealed small ulcers, referred to as erosions, and biopsies indicated the presence of breast cancer in the stomach. Additionally, a large hiatal hernia and gastric antral vascular ectasia (GAVE) were noted. The family is uncertain  whether the cancerous lesions were present before the recent bleed or if they developed recently.  She has been experiencing issues with medication tolerance, particularly with Ibrance, which caused her platelet count to drop significantly. Due to her sensitivity, she has been on a reduced dose of Ibrance, taking it every other day, three days a week. There are concerns about the effectiveness of the medication at this dose. She has also been taking B12 and folic acid supplements to help with her low red blood cell counts, which were identified during her hospital stay.  She has a history of low hemoglobin levels, which were exacerbated by the gastrointestinal bleed.The family is concerned about the potential for future bleeding episodes, as she has large blood vessels in her stomach that could bleed intermittently.  She has expressed concerns about the frequent need for blood draws and the difficulty in accessing her veins, which have become problematic due to her low blood counts and dehydration. The family is considering the possibility of a port to reduce the number of needle sticks required for blood draws and transfusions.  Rest of the pertinent 10 point ROS reviewed and negative  Patient Active Problem List   Diagnosis Date Noted   Malnutrition of moderate degree 08/17/2023   GAVE (gastric antral vascular ectasia) 08/17/2023   Gastric erosion 08/17/2023   Melena 08/16/2023   Anemia associated with acute blood loss 08/16/2023   Peripheral edema  12/29/2022   Restless leg syndrome 10/03/2021   Anxiety associated with depression 06/10/2020   Port-A-Cath in place 11/12/2018   Malignant neoplasm of upper-outer quadrant of right breast in female, estrogen receptor positive (HCC) 08/08/2018   Right knee pain 03/01/2015   Aortic atherosclerosis (HCC) 12/28/2014   Obesity (BMI 30-39.9) 04/09/2014   Insomnia due to stress 04/09/2014   Atrial fibrillation (HCC) 10/01/2013   GERD  (gastroesophageal reflux disease) 10/01/2013   Bipolar disorder (HCC) 09/27/2007   Essential hypertension 09/27/2007   ALLERGIC RHINITIS 09/27/2007   IRRITABLE BOWEL SYNDROME 09/27/2007   Arthropathy 09/27/2007   DEGENERATIVE DISC DISEASE, LUMBOSACRAL SPINE 09/27/2007    is allergic to iohexol, codeine, palonosetron, prednisone, sulfonamide derivatives, celecoxib, sulfa antibiotics, and vitamin b12.  MEDICAL HISTORY: Past Medical History:  Diagnosis Date   A-fib (HCC) 11/2012   PAF   Anxiety    takes Ativan   Asthma 04/07/2011   dx   Bipolar disorder (HCC)    Cancer (HCC)    Right breast   Depression    Early cataracts, bilateral    Fatty liver    Fibromyalgia    GERD (gastroesophageal reflux disease)    Irritable bowel syndrome    Mental disorder    dx bipolar   PONV (postoperative nausea and vomiting)    Sleep apnea 04/2011   does not wear CPAP   Tardive dyskinesia     SURGICAL HISTORY: Past Surgical History:  Procedure Laterality Date   ABDOMINAL HYSTERECTOMY     BIOPSY  08/17/2023   Procedure: BIOPSY;  Surgeon: Iva Boop, MD;  Location: [facility];  Service: Gastroenterology;;   COLONOSCOPY     DILATION AND CURETTAGE OF UTERUS  1981   abnormal pap   ESOPHAGOGASTRODUODENOSCOPY (EGD) WITH PROPOFOL N/A 08/17/2023   Procedure: ESOPHAGOGASTRODUODENOSCOPY (EGD) WITH PROPOFOL;  Surgeon: Iva Boop, MD;  Location: [facility];  Service: Gastroenterology;  Laterality: N/A;   HOT HEMOSTASIS N/A 08/17/2023   Procedure: HOT HEMOSTASIS (ARGON PLASMA COAGULATION/BICAP);  Surgeon: Iva Boop, MD;  Location: [facility];  Service: Gastroenterology;  Laterality: N/A;   KNEE ARTHROSCOPY Left 2007   x 2   LUMBAR LAMINECTOMY/DECOMPRESSION MICRODISCECTOMY  05/31/2011   Procedure: LUMBAR LAMINECTOMY/DECOMPRESSION MICRODISCECTOMY;  Surgeon: Javier Docker;  Location: [facility];  Service: Orthopedics;  Laterality: Left;  Decompression Lumbar four to five and  Lumbar five  to Sacral one on Left  (X-Ray)   MASTECTOMY W/ SENTINEL NODE BIOPSY Right 09/16/2018   Procedure: RIGHT MASTECTOMY WITH RIGHT AXILLARY SENTINEL LYMPH NODE BIOPSY;  Surgeon: Ovidio Kin, MD;  Location: [facility] OR;  Service: General;  Laterality: Right;   PARTIAL HYSTERECTOMY  1982   PORTACATH PLACEMENT Left 10/31/2018   Procedure: INSERTION PORT-A-CATH WITH ULTRASOUND;  Surgeon: Ovidio Kin, MD;  Location: [facility];  Service: General;  Laterality: Left;   TONSILLECTOMY     as child   TOTAL KNEE ARTHROPLASTY Left 12/18/2005    SOCIAL HISTORY: Social History   Socioeconomic History   Marital status: Married    Spouse name: [name]   Number of children: 2   Years of education: 12   Highest education level: Not on file  Occupational History   Occupation: Disabled  Tobacco Use   Smoking status: Never   Smokeless tobacco: Never  Vaping Use   Vaping status: Never Used  Substance and Sexual Activity   Alcohol use: No   Drug use: No   Sexual activity: Yes    Birth control/protection: Post-menopausal,  Surgical  Other Topics Concern   Not on file  Social History Narrative   Tea daily.  Rarely has caffeine    Social Drivers of Corporate investment banker Strain: Low Risk  (08/18/2022)   Overall Financial Resource Strain (CARDIA)    Difficulty of Paying Living Expenses: Not hard at all  Food Insecurity: No Food Insecurity (08/16/2023)   Hunger Vital Sign    Worried About Running Out of Food in the Last Year: Never true    Ran Out of Food in the Last Year: Never true  Transportation Needs: No Transportation Needs (08/16/2023)   PRAPARE - Administrator, Civil Service (Medical): No    Lack of Transportation (Non-Medical): No  Physical Activity: Inactive (08/18/2022)   Exercise Vital Sign    Days of Exercise per Week: 0 days    Minutes of Exercise per Session: 0 min  Stress: No Stress Concern Present (08/18/2022)   [employer] of Occupational Health -  Occupational Stress Questionnaire    Feeling of Stress : Not at all  Social Connections: Moderately Integrated (08/16/2023)   Social Connection and Isolation Panel [NHANES]    Frequency of Communication with Friends and Family: More than three times a week    Frequency of Social Gatherings with Friends and Family: More than three times a week    Attends Religious Services: More than 4 times per year    Active Member of Clubs or Organizations: No    Attends Banker Meetings: Never    Marital Status: Married  Catering manager Violence: Not At Risk (08/16/2023)   Humiliation, Afraid, Rape, and Kick questionnaire    Fear of Current or Ex-Partner: No    Emotionally Abused: No    Physically Abused: No    Sexually Abused: No    FAMILY HISTORY: Family History  Problem Relation Age of Onset   Anesthesia problems Mother    Heart disease Mother        CHF, atrial fib   Heart failure Mother    Anesthesia problems Sister    Colon polyps Father    Heart disease Father        "Fluid around the heart"   Hypertension Sister    Breast cancer Maternal Aunt    Colon cancer Maternal Aunt    Prostate cancer Neg Hx    Ovarian cancer Neg Hx      PHYSICAL EXAMINATION   Vitals:   08/28/23 1418  BP: (!) 103/40  Pulse: (!) 106  Resp: 16  Temp: 98.1 F (36.7 C)  SpO2: 96%    General appearance: ongoing tardive dyskinesia movements like lip smacking, very well controlled today No other acute distress  LABORATORY DATA:  CBC    Component Value Date/Time   WBC 5.6 08/28/2023 1555   RBC 2.62 (L) 08/28/2023 1555   HGB 8.1 (L) 08/28/2023 1555   HGB 8.7 (L) 06/04/2023 1506   HGB 12.0 06/05/2022 1024   HCT 24.9 (L) 08/28/2023 1555   HCT 37.4 06/05/2022 1024   PLT 165 08/28/2023 1555   PLT 154 06/04/2023 1506   PLT 251 06/05/2022 1024   MCV 95.0 08/28/2023 1555   MCV 86 06/05/2022 1024   MCH 30.9 08/28/2023 1555   MCHC 32.5 08/28/2023 1555   RDW 18.6 (H) 08/28/2023 1555    RDW 15.5 (H) 06/05/2022 1024   LYMPHSABS 0.3 (L) 08/28/2023 1555   LYMPHSABS 0.9 06/05/2022 1024   MONOABS 0.8 08/28/2023 1555  EOSABS 0.0 08/28/2023 1555   EOSABS 0.1 06/05/2022 1024   BASOSABS 0.0 08/28/2023 1555   BASOSABS 0.1 06/05/2022 1024    CMP     Component Value Date/Time   NA 137 08/28/2023 1555   NA 145 (H) 06/05/2022 1024   K 3.8 08/28/2023 1555   CL 102 08/28/2023 1555   CO2 29 08/28/2023 1555   GLUCOSE 145 (H) 08/28/2023 1555   BUN 10 08/28/2023 1555   BUN 17 06/05/2022 1024   CREATININE 0.45 08/28/2023 1555   CALCIUM 8.6 (L) 08/28/2023 1555   PROT 5.5 (L) 08/28/2023 1555   PROT 6.6 06/05/2022 1024   ALBUMIN 2.9 (L) 08/28/2023 1555   ALBUMIN 4.0 06/05/2022 1024   AST 39 08/28/2023 1555   ALT 34 08/28/2023 1555   ALKPHOS 196 (H) 08/28/2023 1555   BILITOT 0.7 08/28/2023 1555   GFRNONAA >60 08/28/2023 1555   GFRAA 81 02/03/2020 1049   GFRAA >60 12/19/2018 1434     ASSESSMENT and THERAPY PLAN:   Malignant neoplasm of upper-outer quadrant of right breast in female, estrogen receptor positive (HCC) Doris Smith is a 73 year-old woman with metastatic breast cancer here today for follow-up and evaluation  Metastatic Breast Cancer Recent endoscopy revealed small erosions (ulcers) in the stomach, biopsies of which showed breast cancer cells. The patient has been on Ibrance, but due to low platelet counts, the dose has been reduced and frequency altered. The effectiveness of this regimen is uncertain. The patient is sensitive to medications, limiting options for aggressive treatment. -Consider switching to verzenio at 50 mg po BID since this may not cause cytopenias. I discussed about the possible adverse effects including nausea, vomiting, diarrhea, need to monitor labs but I hope she may be able to tolerate this dose. If she cannot tolerate this, then I might try ORSERDU given ESR1 mutation. -FU PET scan to assess the extent of disease. She is allergic to dye limiting use  of CT. -Continue Faslodex injections. -Hb stable today  Gastric Antral Vascular Ectasia (GAVE) Large dilated blood vessels in the stomach, unrelated to the cancer, which may have contributed to recent GI bleed along with met breast cancer causing erosions -Monitor for recurrent bleeding.  Anemia Likely multifactorial, due to GI bleed and possibly Ibrance. The patient has been started on B12 and folic acid supplements. -Continue B12 for at least three months. -Recheck folic acid levels in a few months.  Venous Access Difficulty with venous access for blood draws and potential future transfusions. -Consider placement of a port to reduce the need for peripheral venous access.  Follow-up -Review PET scan results and blood work. -Consider starting verzenio, if not tolerated, we can attempt orserdu. -Continue monitoring blood counts and tumor markers.    All questions were answered. The patient knows to call the clinic with any problems, questions or concerns. We can certainly see the patient much sooner if necessary.  Total encounter time:40 minutes*in face-to-face visit time, chart review, lab review, care coordination, order entry, and documentation of the encounter time.   *Total Encounter Time as defined by the Centers for Medicare and Medicaid Services includes, in addition to the face-to-face time of a patient visit (documented in the note above) non-face-to-face time: obtaining and reviewing outside history, ordering and reviewing medications, tests or procedures, care coordination (communications with other health care professionals or caregivers) and documentation in the medical record.

## 2023-08-28 NOTE — Assessment & Plan Note (Signed)
 Doris Smith is a 74 year-old woman with metastatic breast cancer here today for follow-up and evaluation  Metastatic Breast Cancer Recent endoscopy revealed small erosions (ulcers) in the stomach, biopsies of which showed breast cancer cells. The patient has been on Ibrance, but due to low platelet counts, the dose has been reduced and frequency altered. The effectiveness of this regimen is uncertain. The patient is sensitive to medications, limiting options for aggressive treatment. -Consider switching to verzenio at 50 mg po BID since this may not cause cytopenias. I discussed about the possible adverse effects including nausea, vomiting, diarrhea, need to monitor labs but I hope she may be able to tolerate this dose. If she cannot tolerate this, then I might try ORSERDU given ESR1 mutation. -FU PET scan to assess the extent of disease. She is allergic to dye limiting use of CT. -Continue Faslodex injections. -Hb stable today  Gastric Antral Vascular Ectasia (GAVE) Large dilated blood vessels in the stomach, unrelated to the cancer, which may have contributed to recent GI bleed along with met breast cancer causing erosions -Monitor for recurrent bleeding.  Anemia Likely multifactorial, due to GI bleed and possibly Ibrance. The patient has been started on B12 and folic acid supplements. -Continue B12 for at least three months. -Recheck folic acid levels in a few months.  Venous Access Difficulty with venous access for blood draws and potential future transfusions. -Consider placement of a port to reduce the need for peripheral venous access.  Follow-up -Review PET scan results and blood work. -Consider starting verzenio, if not tolerated, we can attempt orserdu. -Continue monitoring blood counts and tumor markers.

## 2023-08-29 ENCOUNTER — Encounter: Payer: Self-pay | Admitting: Pharmacist

## 2023-08-29 ENCOUNTER — Other Ambulatory Visit (HOSPITAL_COMMUNITY): Payer: Self-pay

## 2023-08-29 ENCOUNTER — Telehealth: Payer: Self-pay | Admitting: Pharmacy Technician

## 2023-08-29 ENCOUNTER — Encounter: Payer: Self-pay | Admitting: Hematology and Oncology

## 2023-08-29 ENCOUNTER — Telehealth: Payer: Self-pay | Admitting: Pharmacist

## 2023-08-29 LAB — CANCER ANTIGEN 27.29: CA 27.29: 1031.2 U/mL — ABNORMAL HIGH (ref 0.0–38.6)

## 2023-08-29 NOTE — Telephone Encounter (Signed)
 Oral Oncology Patient Advocate Encounter   Received notification that prior authorization for Verzenio is required.   PA submitted on 08/29/23 Key [id number] Status is pending     Jinger Neighbors, CPhT-Adv Oncology Pharmacy Patient Advocate [facility] Direct Number: [phone]  Fax: [phone]

## 2023-08-29 NOTE — Telephone Encounter (Signed)
 Oral Oncology Patient Advocate Encounter  Prior Authorization for Doris Smith has been approved.    Effective dates: 08/29/23 through until  Patients co-pay is $1,403.24.    Jinger Neighbors, CPhT-Adv Oncology Pharmacy Patient Advocate [facility] Direct Number: [phone]  Fax: [phone]

## 2023-08-29 NOTE — Telephone Encounter (Signed)
 Left patient a voicemail in regards to scheduled appointment, left callback number for scheduling

## 2023-08-29 NOTE — Telephone Encounter (Signed)
 Oral Oncology Patient Advocate Encounter  Patient is enrolled in [facility] Prescription Payment plan. I will call and discuss if she would like to use that for this medication as well.  Jinger Neighbors, CPhT-Adv Oncology Pharmacy Patient Advocate [facility] Direct Number: [phone]  Fax: [phone]

## 2023-08-30 ENCOUNTER — Telehealth: Payer: Self-pay | Admitting: Pharmacy Technician

## 2023-08-30 ENCOUNTER — Telehealth: Payer: Self-pay | Admitting: Hematology and Oncology

## 2023-08-30 ENCOUNTER — Encounter (HOSPITAL_COMMUNITY): Payer: Medicare Other

## 2023-08-30 NOTE — Telephone Encounter (Signed)
 Rescheduled appointment per 2/20 secure chat. Called and left VM for the patients sister Chip Boer with appointment details.

## 2023-08-30 NOTE — Telephone Encounter (Signed)
 Oral Oncology Patient Advocate Encounter  Reached out and spoke with patient's sister regarding PAP paperwork, explained that I would send it to their preferred email via DocuSign.   Confirmed email address: [email or web address]  Patient's sister expressed understanding and consent.  Will follow up once paperwork has been signed and returned.   Omer Jack, CPhT-Adv Oncology Pharmacy Patient Advocate [facility]  Direct Number: [phone]  Fax: [phone]

## 2023-08-30 NOTE — Telephone Encounter (Signed)
 Oral Oncology Patient Advocate Encounter   Began application for assistance for Verzenio through [employer].   Application will be submitted upon completion of necessary supporting documentation.   [employer] number [id number].   I will continue to check the status until final determination.   Omer Jack, CPhT-Adv Oncology Pharmacy Patient Advocate [facility]  Direct Number: [phone]  Fax: [phone]

## 2023-09-03 ENCOUNTER — Telehealth: Payer: Self-pay

## 2023-09-03 NOTE — Telephone Encounter (Signed)
 Pt called reporting pain is not well managed but she is sleeping more often. When discussed with pt and husband [name], pt c/o increased pain but increased sleepiness which they are attributing to the pain medication. Pt is taking her Oxyontin 15mg  every 12 hours but is taking her percocet only 2-3 times a day. Pt and husband requesting a decrease in pain medication to see if that helps her stay awake throughout the day. Pt advised per [name], NP to cut percocet in half during the day and take every 4 hours as needed for pain and to take a whole tablet at night. Pt also advised that this was a decrease in her pain medication so her pain would not necessarily improve, pt and husband verbalized understanding and desire to try. No further needs at this time.

## 2023-09-04 ENCOUNTER — Ambulatory Visit (HOSPITAL_COMMUNITY)
Admission: RE | Admit: 2023-09-04 | Discharge: 2023-09-04 | Disposition: A | Discharge status: Home / Self Care | Payer: Medicare Other | Source: Ambulatory Visit | Attending: Hematology and Oncology | Admitting: Hematology and Oncology

## 2023-09-04 ENCOUNTER — Inpatient Hospital Stay: Payer: Medicare Other | Admitting: Pharmacist

## 2023-09-04 ENCOUNTER — Other Ambulatory Visit (HOSPITAL_COMMUNITY): Payer: Self-pay

## 2023-09-04 ENCOUNTER — Encounter: Payer: Self-pay | Admitting: Hematology and Oncology

## 2023-09-04 VITALS — BP 122/48 | HR 109 | Temp 98.2°F | Resp 18 | Wt 161.0 lb

## 2023-09-04 DIAGNOSIS — Z170 Estrogen receptor positive status [ER+]: Secondary | ICD-10-CM | POA: Diagnosis not present

## 2023-09-04 DIAGNOSIS — D649 Anemia, unspecified: Secondary | ICD-10-CM | POA: Diagnosis not present

## 2023-09-04 DIAGNOSIS — Z79899 Other long term (current) drug therapy: Secondary | ICD-10-CM | POA: Diagnosis not present

## 2023-09-04 DIAGNOSIS — K31819 Angiodysplasia of stomach and duodenum without bleeding: Secondary | ICD-10-CM | POA: Diagnosis not present

## 2023-09-04 DIAGNOSIS — C50411 Malignant neoplasm of upper-outer quadrant of right female breast: Secondary | ICD-10-CM | POA: Diagnosis not present

## 2023-09-04 DIAGNOSIS — C7951 Secondary malignant neoplasm of bone: Secondary | ICD-10-CM | POA: Diagnosis not present

## 2023-09-04 LAB — GLUCOSE, CAPILLARY: Glucose-Capillary: 108 mg/dL — ABNORMAL HIGH (ref 70–99)

## 2023-09-04 MED ORDER — FLUDEOXYGLUCOSE F - 18 (FDG) INJECTION
8.0000 | Freq: Once | INTRAVENOUS | Status: AC
  Administered 2023-09-04: 8 via INTRAVENOUS

## 2023-09-04 NOTE — Progress Notes (Signed)
 [facility]       Telephone: [phone]?Fax: [phone]   Oncology Clinical Pharmacist Practitioner Initial Assessment  Doris Smith is a 73 y.o. female with a diagnosis of breast cancer. They were contacted today via in-person visit. She is accompanied by her husband Doris Smith and her sister Doris Smith is on the telephone.  Indication/Regimen Abemaciclib (Verzenio) is being used appropriately for treatment of metastatic breast cancer by Dr. Rachel Moulds.      Wt Readings from Last 1 Encounters:  09/04/23 161 lb (73 kg)    Estimated body surface area is 1.82 meters squared as calculated from the following:   Height as of 08/17/23: 5\' 4"  (1.626 m).   Weight as of this encounter: 161 lb (73 kg).  The dosing regimen is 150 mg by mouth every 12 hours on days 1 to 28 of a 28-day cycle. This is being given  in combination with fulvestrant and zoledronic acid . It is planned to continue until disease progression or unacceptable toxicity. Prescription dose and frequency assessed for appropriateness.  Patient has agreed to treatment which is documented in physician note on 08/28/23. Counseled patient on administration, dosing, side effects, monitoring, drug-food interactions, safe handling, storage, and disposal.  Abemaciclib will likely be shipped on Friday and she will start the next day. She will see Dr. Al Pimple with labs on 09/17/23.   Dose Modifications None  Access Assessment Doris Smith will be receiving abemaciclib through [facility] Concerns: No Start date if known: TBD -- likely 09/08/23  Adherence Assessment Reviewed importance on keeping a med schedule and plan for any missed doses Barriers to adherence identified? No  Allergies Allergies  Allergen Reactions   Iohexol     "over 20 years ago" had reaction "hurting in arm and heart"-Done in [place], [place].Marland Kitchenphysician stopped CT at that time.   Codeine Other (See Comments)     Makes feel like she is wild    Palonosetron Other (See Comments)    headache   Prednisone Other (See Comments)    Makes me very irritable   Sulfonamide Derivatives Other (See Comments)    Upset stomach   Celecoxib Nausea And Vomiting   Sulfa Antibiotics Nausea And Vomiting   Vitamin B12 Rash    Vitals    09/04/2023    2:29 PM 08/28/2023    2:18 PM 08/19/2023    4:32 AM  Oncology Vitals  Weight 73.029 kg 75.932 kg   Weight (lbs) 161 lbs 167 lbs 6 oz   BMI 27.64 kg/m2 28.73 kg/m2   Temp 98.2 F (36.8 C) 98.1 F (36.7 C) 97.7 F (36.5 C)  Pulse Rate 109 106 80  BP 122/48 103/40 109/49  Resp 18 16 16   SpO2 96 % 96 % 97 %  BSA (m2) 1.82 m2 1.85 m2      Laboratory Data    Latest Ref Rng & Units 08/28/2023    3:55 PM 08/19/2023    7:54 AM 08/18/2023    7:30 PM  CBC EXTENDED  WBC 4.0 - 10.5 K/uL 5.6  3.3  4.5   RBC 3.87 - 5.11 MIL/uL 2.62  2.86  3.05   Hemoglobin 12.0 - 15.0 g/dL 8.1  8.6  9.2   HCT 13.0 - 46.0 % 24.9  28.3  28.6   Platelets 150 - 400 K/uL 165  101  91   NEUT# 1.7 - 7.7 K/uL 4.4     Lymph# 0.7 - 4.0 K/uL  0.3          Latest Ref Rng & Units 08/28/2023    3:55 PM 08/17/2023    6:58 AM 08/16/2023    9:30 PM  CMP  Glucose 70 - 99 mg/dL 161  096  045   BUN 8 - 23 mg/dL 10  14  14    Creatinine 0.44 - 1.00 mg/dL 4.09  8.11  9.14   Sodium 135 - 145 mmol/L 137  141  138   Potassium 3.5 - 5.1 mmol/L 3.8  3.6  3.6   Chloride 98 - 111 mmol/L 102  107  105   CO2 22 - 32 mmol/L 29  25  25    Calcium 8.9 - 10.3 mg/dL 8.6  8.4  8.5   Total Protein 6.5 - 8.1 g/dL 5.5  5.2  5.4   Total Bilirubin 0.0 - 1.2 mg/dL 0.7  0.9  1.0   Alkaline Phos 38 - 126 U/L 196  205  214   AST 15 - 41 U/L 39  44  53   ALT 0 - 44 U/L 34  22  23    No results found for: "MG" Lab Results  Component Value Date   CA2729 1,031.2 (H) 08/28/2023   CA2729 841.2 (H) 08/14/2023   NW2956 1,319.5 (H) 07/17/2023    Contraindications Contraindications were reviewed? Yes Contraindications to  therapy were identified? No   Safety Precautions The following safety precautions for the use of abemaciclib were reviewed:  Changes in kidney function: importance of drinking plenty of fluids and monitoring urine output Diarrhea: we reviewed that diarrhea is common with abemaciclib and confirmed that she does have loperamide (Imodium) at home.  We reviewed how to take this medication PRN and gave her information on abemaciclib Decreased white blood cells (WBCs) and increased risk for infection: we discussed the importance of having a thermometer and what the Centers for Disease Control and Prevention (CDC) considers a fever which is 100.56F (38C) or higher.  Gave patient 24/7 triage line to call if any fevers or symptoms Decreased hemoglobin, part of red blood cells that carry iron and oxygen Fatigue Nausea and Vomiting Hepatotoxicity: reviewed to contact clinic for RUQ pain that will not subside, yellowing of eyes/skin Decreased appetite or weight loss Abdominal pain Decreased platelet count and increased risk for bleeding Venous thromboembolism (VTE): reviewed signs of deep vein thrombosis (DVT) such as leg swelling, redness, pain, or tenderness and signs of pulmonary embolism (PE) such as shortness of breath, rapid or irregular heartbeat, cough, chest pain, or lightheadedness ILD/Pneumonitis: we reviewed potential symptoms including cough, shortness, and fatigue. Handling body fluids and waste Pregnancy, sexual activity, and contraception Avoid grapefruit products Reviewed to take the medication every 12 hours (with food sometimes can be easier on the stomach) and to take it at the same time every day. Discussed proper storage and handling of abemaciclib  Medication Reconciliation Current Outpatient Medications  Medication Sig Dispense Refill   abemaciclib (VERZENIO) 50 MG tablet Take 1 tablet (50 mg total) by mouth 2 (two) times daily. 56 tablet 1   cyanocobalamin 1000 MCG tablet Take  1 tablet (1,000 mcg total) by mouth daily. 60 tablet 0   folic acid (FOLVITE) 1 MG tablet Take 1 tablet (1 mg total) by mouth daily. 60 tablet 0   Incontinence Supply Disposable (CERTAINTY FITTED BRIEFS XL) MISC 1 Package by Does not apply route as needed. 56 each 3   lidocaine (LIDODERM) 5 % Place 2 patches onto the  skin daily. Remove & Discard patch within 12 hours or as directed by MD 60 patch 2   LORazepam (ATIVAN) 1 MG tablet Take 1 tablet (1 mg total) by mouth 2 (two) times daily as needed for anxiety. 60 tablet 5   ondansetron (ZOFRAN) 8 MG tablet Take 1 tablet (8 mg total) by mouth every 8 (eight) hours as needed for nausea or vomiting. 30 tablet 5   oxyCODONE (OXYCONTIN) 15 mg 12 hr tablet Take 1 tablet (15 mg total) by mouth every 12 (twelve) hours. 60 tablet 0   oxyCODONE-acetaminophen (PERCOCET) 10-325 MG tablet Take 1 tablet by mouth every 4 (four) hours as needed for pain. 60 tablet 0   pantoprazole (PROTONIX) 40 MG tablet Take 1 tablet (40 mg total) by mouth 2 (two) times daily before a meal. 120 tablet 0   polyethylene glycol (MIRALAX / GLYCOLAX) 17 g packet Take 17 g by mouth daily.     pramipexole (MIRAPEX) 1 MG tablet TAKE 1 TABLET BY MOUTH THREE TIMES DAILY 90 tablet 0   prochlorperazine (COMPAZINE) 10 MG tablet Take 1 tablet (10 mg total) by mouth every 6 (six) hours as needed for nausea or vomiting. 30 tablet 2   sucralfate (CARAFATE) 1 g tablet Take 1 tablet (1 g total) by mouth 4 (four) times daily -  with meals and at bedtime for 28 days. 112 tablet 0   Zoledronic Acid (ZOMETA IV) Inject into the vein.     No current facility-administered medications for this visit.   Facility-Administered Medications Ordered in Other Visits  Medication Dose Route Frequency Provider Last Rate Last Admin   heparin lock flush 100 unit/mL  500 Units Intravenous Once Magrinat, Valentino Hue, MD       sodium chloride flush (NS) 0.9 % injection 10 mL  10 mL Intravenous PRN Magrinat, Valentino Hue, MD         Medication reconciliation is based on the patient's most recent medication list in the electronic medical record (EMR) including herbal products and OTC medications.   The patient's medication list was reviewed today with the patient? Yes   Drug-drug interactions (DDIs) DDIs were evaluated? Yes Significant DDIs identified? No   Drug-Food Interactions Drug-food interactions were evaluated? Yes Drug-food interactions identified? Grapefruit products  Follow-up Plan  Patient education handout given to patient Start abemaciclib 150 mg by mouth every 12 hours. Start once received Continue fulvestrant 500 mg IM every 28 days. Next due 09/11/23 Continue zoledronic acid 4 mg IV every 12 weeks. Next due 10/09/23 Labs. Dr. Al Pimple visit, Fulvestrant on 09/17/23 -- patient and family asking if folic acid and B12 need to be continued She also follows with palliative care. Next visit 09/17/23 PET scan today Doris Smith can follow up with clinical pharmacy as deemed necessary by Dr. Burnice Logan Iruku going forward   Doris Smith participated in the discussion, expressed understanding, and voiced agreement with the above plan. All questions were answered to her satisfaction. The patient was advised to contact the clinic at [phone] with any questions or concerns prior to her return visit.   I spent 30 minutes assessing the patient.  Ariele Vidrio A. Odetta Pink, PharmD, BCOP, CPP  Anselm Lis, RPH-CPP, 09/04/2023 2:59 PM  **Disclaimer: This note was dictated with voice recognition software. Similar sounding words can inadvertently be transcribed and this note may contain transcription errors which may not have been corrected upon publication of note.**

## 2023-09-05 ENCOUNTER — Other Ambulatory Visit: Payer: Self-pay | Admitting: Nurse Practitioner

## 2023-09-05 DIAGNOSIS — G2581 Restless legs syndrome: Secondary | ICD-10-CM

## 2023-09-06 ENCOUNTER — Ambulatory Visit: Payer: Medicare Other | Admitting: Pharmacist

## 2023-09-06 ENCOUNTER — Other Ambulatory Visit (HOSPITAL_COMMUNITY): Payer: Self-pay

## 2023-09-06 ENCOUNTER — Other Ambulatory Visit: Payer: Self-pay | Admitting: Pharmacy Technician

## 2023-09-06 ENCOUNTER — Other Ambulatory Visit: Payer: Self-pay

## 2023-09-06 ENCOUNTER — Encounter: Payer: Self-pay | Admitting: Hematology and Oncology

## 2023-09-06 NOTE — Telephone Encounter (Signed)
 Oral Oncology Patient Advocate Encounter  Patient opted to use prescription payment plan. Application will be closed at this time.  Doris Smith, CPhT-Adv Oncology Pharmacy Patient Advocate [facility]  Direct Number: [phone]  Fax: [phone]

## 2023-09-06 NOTE — Progress Notes (Signed)
 Patient counseled in clinic in-person visit note on 09/04/23

## 2023-09-06 NOTE — Progress Notes (Signed)
 Specialty Pharmacy Initial Fill Coordination Note  Doris Smith is a 73 y.o. female contacted today regarding refills of specialty medication(s) Abemaciclib (VERZENIO) .  Patient requested Delivery  on 09/07/23  to verified address [address]  [place], [place] [address]   Medication will be filled on 09/06/23.   Patient is aware of $1,403.24 copayment. Patient has agreed to use their medicare prescription payment plan to split this cost up for payment.

## 2023-09-11 ENCOUNTER — Other Ambulatory Visit: Payer: Self-pay | Admitting: *Deleted

## 2023-09-11 ENCOUNTER — Telehealth: Payer: Self-pay | Admitting: *Deleted

## 2023-09-11 ENCOUNTER — Encounter: Payer: Self-pay | Admitting: *Deleted

## 2023-09-11 DIAGNOSIS — Z170 Estrogen receptor positive status [ER+]: Secondary | ICD-10-CM

## 2023-09-11 DIAGNOSIS — J910 Malignant pleural effusion: Secondary | ICD-10-CM

## 2023-09-11 NOTE — Telephone Encounter (Signed)
 This RN spoke with pt per PET scan results and noted pleural effusions- discussed and informed her need for removal of fluid for improved breathing.   Doris Smith in agreement to above and procedure scheduled for 11/13/2023 at 830 am arrival at [facility] radiology.

## 2023-09-11 NOTE — Telephone Encounter (Signed)
-----   Message from [place] Iruku sent at 09/11/2023  8:32 AM EST ----- It looks like there are bilateral pleural effusions, can we do an [place] and see if they can drain some.  Thanks,

## 2023-09-13 ENCOUNTER — Ambulatory Visit (HOSPITAL_COMMUNITY)
Admission: RE | Admit: 2023-09-13 | Discharge: 2023-09-13 | Disposition: A | Discharge status: Home / Self Care | Source: Ambulatory Visit | Attending: Student | Admitting: Student

## 2023-09-13 ENCOUNTER — Other Ambulatory Visit (HOSPITAL_COMMUNITY): Payer: Self-pay | Admitting: Student

## 2023-09-13 ENCOUNTER — Ambulatory Visit (HOSPITAL_COMMUNITY)
Admission: RE | Admit: 2023-09-13 | Discharge: 2023-09-13 | Disposition: A | Discharge status: Home / Self Care | Source: Ambulatory Visit | Attending: Hematology and Oncology | Admitting: Hematology and Oncology

## 2023-09-13 DIAGNOSIS — Z170 Estrogen receptor positive status [ER+]: Secondary | ICD-10-CM

## 2023-09-13 DIAGNOSIS — J948 Other specified pleural conditions: Secondary | ICD-10-CM | POA: Diagnosis not present

## 2023-09-13 DIAGNOSIS — J90 Pleural effusion, not elsewhere classified: Secondary | ICD-10-CM | POA: Insufficient documentation

## 2023-09-13 DIAGNOSIS — J910 Malignant pleural effusion: Secondary | ICD-10-CM

## 2023-09-13 DIAGNOSIS — K449 Diaphragmatic hernia without obstruction or gangrene: Secondary | ICD-10-CM | POA: Diagnosis not present

## 2023-09-13 DIAGNOSIS — I700 Atherosclerosis of aorta: Secondary | ICD-10-CM | POA: Diagnosis not present

## 2023-09-13 DIAGNOSIS — C50919 Malignant neoplasm of unspecified site of unspecified female breast: Secondary | ICD-10-CM | POA: Diagnosis not present

## 2023-09-13 HISTORY — PX: IR THORACENTESIS ASP PLEURAL SPACE W/IMG GUIDE: IMG5380

## 2023-09-13 MED ORDER — LIDOCAINE HCL 1 % IJ SOLN
INTRAMUSCULAR | Status: AC
  Filled 2023-09-13: qty 20

## 2023-09-13 NOTE — Procedures (Addendum)
 PROCEDURE SUMMARY:  Successful US guided left thoracentesis. Yielded 450 mL of yellow fluid. Pt tolerated procedure well. No immediate complications.  Specimen was sent for labs. CXR ordered.  EBL < 5 mL  Hoyt Koch PA-C 09/13/2023 10:01 AM

## 2023-09-14 LAB — CYTOLOGY - NON PAP

## 2023-09-14 NOTE — Progress Notes (Signed)
 Palliative Medicine [facility]  [phone] [phone]   Name: Doris Smith Date: 09/14/2023 MRN: [id number]  DOB: [birth year]  Patient Care Team: [name], FNP as PCP - General (Family Medicine) Jodelle Gross, NP as PCP - Cardiology (Nurse Practitioner) Ovidio Kin, MD as Consulting Physician (General Surgery) Dorothy Puffer, MD as Consulting Physician (Radiation Oncology) Lysle Pearl, MD as Consulting Physician (Pulmonary Disease) Beverely Low, MD as Consulting Physician (Orthopedic Surgery) Jene Every, MD as Consulting Physician (Orthopedic Surgery) Oneta Rack, NP as Nurse Practitioner (Adult Health Nurse Practitioner) Pershing Proud, RN as Oncology Nurse Navigator Donnelly Angelica, RN as Oncology Nurse Navigator Glenna Fellows, MD as Consulting Physician (Plastic Surgery) Rollene Rotunda, MD as Consulting Physician (Cardiology) Rachel Moulds, MD as Medical Oncologist (Hematology and Oncology) Pickenpack-Cousar, Arty Baumgartner, NP as Nurse Practitioner (Hospice and Palliative Medicine)      INTERVAL HISTORY: Doris Smith is a 73 y.o. female with oncologic medical history including estrogen receptor positive breast cancer(07/2018) s/p right mastectomy. PET scan 10/06/22 showing widespread osseous metastasis. Other pertinent history includes atrial fibrillation, asthma, OSA, GERD, fatty liver, fibromyalgia, osteoporosis, chronic headaches, anxiety, depression, and bipolar disorder. Underwent ORIF of right hip 09/20/2022. Palliative ask to see for symptom and pain management and goals of care.   SOCIAL HISTORY:    Mrs. Adamik reports that she has never smoked. She has never used smokeless tobacco. She reports that she does not drink alcohol and does not use drugs.  ADVANCE DIRECTIVES:  Advanced directives on file, Doris Smith, spouse, was identified as Management consultant should patient be unable to make  decision for herself   CODE STATUS: Full code  PAST MEDICAL HISTORY: Past Medical History:  Diagnosis Date   A-fib (HCC) 11/2012   PAF   Anxiety    takes Ativan   Asthma 04/07/2011   dx   Bipolar disorder (HCC)    Cancer (HCC)    Right breast   Depression    Early cataracts, bilateral    Fatty liver    Fibromyalgia    GERD (gastroesophageal reflux disease)    Irritable bowel syndrome    Mental disorder    dx bipolar   PONV (postoperative nausea and vomiting)    Sleep apnea 04/2011   does not wear CPAP   Tardive dyskinesia     ALLERGIES:  is allergic to iohexol, codeine, palonosetron, prednisone, sulfonamide derivatives, celecoxib, sulfa antibiotics, and vitamin b12.  MEDICATIONS:  Current Outpatient Medications  Medication Sig Dispense Refill   abemaciclib (VERZENIO) 50 MG tablet Take 1 tablet (50 mg total) by mouth 2 (two) times daily. (Patient not taking: Reported on 09/04/2023) 56 tablet 1   cyanocobalamin 1000 MCG tablet Take 1 tablet (1,000 mcg total) by mouth daily. 60 tablet 0   folic acid (FOLVITE) 1 MG tablet Take 1 tablet (1 mg total) by mouth daily. 60 tablet 0   Incontinence Supply Disposable (CERTAINTY FITTED BRIEFS XL) MISC 1 Package by Does not apply route as needed. 56 each 3   lidocaine (LIDODERM) 5 % Place 2 patches onto the skin daily. Remove & Discard patch within 12 hours or as directed by MD 60 patch 2   LORazepam (ATIVAN) 1 MG tablet Take 1 tablet (1 mg total) by mouth 2 (two) times daily as needed for anxiety. 60 tablet 5   ondansetron (ZOFRAN) 8 MG tablet Take 1 tablet (8 mg total) by mouth every 8 (eight) hours as needed for  nausea or vomiting. 30 tablet 5   oxyCODONE (OXYCONTIN) 15 mg 12 hr tablet Take 1 tablet (15 mg total) by mouth every 12 (twelve) hours. 60 tablet 0   oxyCODONE-acetaminophen (PERCOCET) 10-325 MG tablet Take 1 tablet by mouth every 4 (four) hours as needed for pain. 60 tablet 0   pantoprazole (PROTONIX) 40 MG tablet Take 1  tablet (40 mg total) by mouth 2 (two) times daily before a meal. 120 tablet 0   polyethylene glycol (MIRALAX / GLYCOLAX) 17 g packet Take 17 g by mouth daily.     pramipexole (MIRAPEX) 1 MG tablet TAKE 1 TABLET BY MOUTH THREE TIMES DAILY 90 tablet 0   prochlorperazine (COMPAZINE) 10 MG tablet Take 1 tablet (10 mg total) by mouth every 6 (six) hours as needed for nausea or vomiting. 30 tablet 2   No current facility-administered medications for this visit.   Facility-Administered Medications Ordered in Other Visits  Medication Dose Route Frequency Provider Last Rate Last Admin   heparin lock flush 100 unit/mL  500 Units Intravenous Once Magrinat, Valentino Hue, MD       sodium chloride flush (NS) 0.9 % injection 10 mL  10 mL Intravenous PRN Magrinat, Valentino Hue, MD        VITAL SIGNS: There were no vitals taken for this visit. There were no vitals filed for this visit.    Estimated body mass index is 27.64 kg/m as calculated from the following:   Height as of 08/17/23: 5\' 4"  (1.626 m).   Weight as of 09/04/23: 161 lb (73 kg).  Assessment NAD, in wheelchair appears uncomfortabl RRR Normal breathing pattern AAO x3  PERFORMANCE STATUS (ECOG) : 2 - Symptomatic, <50% confined to bed  Discussed the use of AI scribe software for clinical note transcription with the patient, who gave verbal consent to proceed.   IMPRESSION:  Doris Smith presents to clinic today for follow-up. She is complaining of uncontrolled pain. She is accompanied by Doris Smith, her husband and her sister, Doris Smith. Patient is in a wheelchair. Appears uncomfortable during visit stating she is hurting and tired. She recently took an Oxycodone prior to our visit. Occasional nausea, which is controlled with prescribed nausea medication when needed. Her sister shares they will occasionally use Pepto Bismol if needed. Advised on use. Denies concerns with constipation or diarrhea. She is taking daily Miralax.   We discussed Mrs. Goates at length  as she has had difficulty managing her pain over the past several months. She reports experiencing significant pain, particularly in her neck, which has been worsening, back, and chest wall.  She is attributing her neck pain to sleeping wrong in bed and also with hyperextension when in the recliner. We discussed ways to better position. [name] describes her pain as severe, rated as ten out of ten at its worst. Taking half of her prescribed pain medication reduces the pain to an eight, but it remains inadequately controlled and intensifies within hours.   She is currently taking OxyContin every twelve hours and has a prescription for a breakthrough pain medication, which she takes as needed. However, she uses the breakthrough medication sparingly, approximately once a day, and sometimes not at all. She mentions taking half a pill of the breakthrough medication, which provides minimal relief. Also takes Tylenol Extra Strength as needed. She sometimes wakes up in the middle of the night due to pain causing for a bad day due to uncontrolled pain. I expressed concerns with her pain management and use of  breakthrough medication and consistency as her last prescription was sent in on December 16th for 60 tablets. Despite this husband reports patient has been taking.   We discussed extensively about her pain management, use of each medication, administration timing, efficacy, and potential side effects. They are aware that we will not make adjustments to her current regimen as it is not being maximized at this time with some inconsistencies. Education provided on when to take medications and risk of "chasing' her pain when not taking medication appropriately. Ultimately it was decided to schedule use of Percocet during the day to allow for better pain control again with still the options of as needed in between those dosing. We discussed her daily schedule and regimen at length with conclusion based on pain needs she  will take one Percocet at 11 am and 5pm daily. Education also provided on maximum use of Tylenol. Written information was provided to patient and husband. Patient is taking Oxycontin appropriately.   We will continue to closely monitor and adjust regimen as needed. All questions answered.  Goals of Care  5/23: Mrs. Trosper is emotional. Her husband becomes emotional as patient shares she knows her health is not the best and her cancer is not curable. She is taking life one day at time. Speaks to when her time comes she will be ready but hopeful it is not now. She knows that no one has a definitive time frame. Expresses her appreciation in all of the care and support. Expresses her goal is to continue to treat the treatable allowing her the ability to continue to thrive while not suffering.   4/30 ;Since the night of 10/30/22, Mrs. Oler has continued to meet goal of lying in her own bed at night beside her husband. She expresses appreciation for the care of everyone at the cancer center and is hopeful for continued improvement.  We discussed Her current illness and what it means in the larger context of her on-going co-morbidities. Natural disease trajectory and expectations were discussed. We discussed the importance of continued conversation with family and their medical providers regarding overall plan of care and treatment options, ensuring decisions are within the context of the patients values and GOCs.   Assessment and Plan  Cancer Related Pain Severe pain in chest wall, back, and neck pain affecting sleep and well-being. Inconsistent breakthrough medication intake leads to inadequate pain control. Emphasized consistent medication intake to prevent chasing pain. - Schedule oxycodone at 11 am and 5p. Continue as needed every 4 hours around administration times. - Encourage taking a whole pill for better pain control as it is obvious a half tablet once a day is not controlling pain.  - Educated on  consistent medication intake. -Patient expresses her goal is for better pain relief emotionally expressing her discomfort and poor quality of life. States if she sleeps after administration, she is willing to continue as the benefit outweighs her suffering.   Cancer Treatment Cancer medication well-tolerated. No diarrhea experienced. - Monitor for side effects.  Anemia Slightly low hemoglobin contributing to feeling unwell. Scheduled blood transfusion per Oncology.  - Proceed with blood transfusion on Thursday.  Nausea Occasional nausea managed with medication and limited Pepto Bismol use. - Continue nausea medication as needed. - Limit Pepto Bismol use.  Constipation Constipation managed with Miralax. Pain medication may contribute but offset by Miralax and potential diarrhea from cancer medication. - Continue Miralax.  - I will plan to see patient back in 2-3 weeks. Sooner if  needed.  Visit consisted of counseling and education dealing with the complex and emotionally intense issues of symptom management and palliative care in the setting of serious and potentially life-threatening illness.  Willette Alma, AGPCNP-BC  Palliative Medicine Team/ Cancer Center

## 2023-09-17 ENCOUNTER — Inpatient Hospital Stay: Payer: Medicare Other | Attending: Hematology and Oncology

## 2023-09-17 ENCOUNTER — Ambulatory Visit

## 2023-09-17 ENCOUNTER — Inpatient Hospital Stay

## 2023-09-17 ENCOUNTER — Inpatient Hospital Stay (HOSPITAL_BASED_OUTPATIENT_CLINIC_OR_DEPARTMENT_OTHER): Payer: Medicare Other | Admitting: Nurse Practitioner

## 2023-09-17 ENCOUNTER — Inpatient Hospital Stay: Payer: Medicare Other

## 2023-09-17 ENCOUNTER — Encounter: Payer: Self-pay | Admitting: Hematology and Oncology

## 2023-09-17 ENCOUNTER — Encounter: Payer: Self-pay | Admitting: Nurse Practitioner

## 2023-09-17 ENCOUNTER — Inpatient Hospital Stay: Payer: Medicare Other | Admitting: Hematology and Oncology

## 2023-09-17 VITALS — BP 123/48 | HR 111 | Temp 98.4°F | Resp 16 | Wt 154.7 lb

## 2023-09-17 DIAGNOSIS — Z170 Estrogen receptor positive status [ER+]: Secondary | ICD-10-CM | POA: Insufficient documentation

## 2023-09-17 DIAGNOSIS — Z1721 Progesterone receptor positive status: Secondary | ICD-10-CM | POA: Diagnosis not present

## 2023-09-17 DIAGNOSIS — C50411 Malignant neoplasm of upper-outer quadrant of right female breast: Secondary | ICD-10-CM

## 2023-09-17 DIAGNOSIS — R530 Neoplastic (malignant) related fatigue: Secondary | ICD-10-CM | POA: Diagnosis not present

## 2023-09-17 DIAGNOSIS — C7951 Secondary malignant neoplasm of bone: Secondary | ICD-10-CM | POA: Insufficient documentation

## 2023-09-17 DIAGNOSIS — Z1732 Human epidermal growth factor receptor 2 negative status: Secondary | ICD-10-CM | POA: Diagnosis not present

## 2023-09-17 DIAGNOSIS — D649 Anemia, unspecified: Secondary | ICD-10-CM | POA: Diagnosis not present

## 2023-09-17 DIAGNOSIS — G893 Neoplasm related pain (acute) (chronic): Secondary | ICD-10-CM | POA: Diagnosis not present

## 2023-09-17 DIAGNOSIS — Z7189 Other specified counseling: Secondary | ICD-10-CM

## 2023-09-17 DIAGNOSIS — Z79899 Other long term (current) drug therapy: Secondary | ICD-10-CM | POA: Diagnosis not present

## 2023-09-17 DIAGNOSIS — Z5111 Encounter for antineoplastic chemotherapy: Secondary | ICD-10-CM | POA: Diagnosis not present

## 2023-09-17 DIAGNOSIS — Z515 Encounter for palliative care: Secondary | ICD-10-CM | POA: Diagnosis not present

## 2023-09-17 LAB — COMPREHENSIVE METABOLIC PANEL
ALT: 26 U/L (ref 0–44)
AST: 28 U/L (ref 15–41)
Albumin: 2.9 g/dL — ABNORMAL LOW (ref 3.5–5.0)
Alkaline Phosphatase: 212 U/L — ABNORMAL HIGH (ref 38–126)
Anion gap: 8 (ref 5–15)
BUN: 10 mg/dL (ref 8–23)
CO2: 30 mmol/L (ref 22–32)
Calcium: 8.2 mg/dL — ABNORMAL LOW (ref 8.9–10.3)
Chloride: 102 mmol/L (ref 98–111)
Creatinine, Ser: 0.66 mg/dL (ref 0.44–1.00)
GFR, Estimated: >60 mL/min (ref >60)
Glucose, Bld: 126 mg/dL — ABNORMAL HIGH (ref 70–99)
Potassium: 3.7 mmol/L (ref 3.5–5.1)
Sodium: 140 mmol/L (ref 135–145)
Total Bilirubin: 0.5 mg/dL (ref 0.0–1.2)
Total Protein: 5.9 g/dL — ABNORMAL LOW (ref 6.5–8.1)

## 2023-09-17 LAB — CBC WITH DIFFERENTIAL/PLATELET
Abs Immature Granulocytes: 0.04 10*3/uL (ref 0.00–0.07)
Basophils Absolute: 0 10*3/uL (ref 0.0–0.1)
Basophils Relative: 1 %
Eosinophils Absolute: 0 10*3/uL (ref 0.0–0.5)
Eosinophils Relative: 1 %
HCT: 23.5 % — ABNORMAL LOW (ref 36.0–46.0)
Hemoglobin: 7.3 g/dL — ABNORMAL LOW (ref 12.0–15.0)
Immature Granulocytes: 1 %
Lymphocytes Relative: 4 %
Lymphs Abs: 0.3 10*3/uL — ABNORMAL LOW (ref 0.7–4.0)
MCH: 28.7 pg (ref 26.0–34.0)
MCHC: 31.1 g/dL (ref 30.0–36.0)
MCV: 92.5 fL (ref 80.0–100.0)
Monocytes Absolute: 0.3 10*3/uL (ref 0.1–1.0)
Monocytes Relative: 5 %
Neutro Abs: 5.6 10*3/uL (ref 1.7–7.7)
Neutrophils Relative %: 88 %
Platelets: 363 10*3/uL (ref 150–400)
RBC: 2.54 MIL/uL — ABNORMAL LOW (ref 3.87–5.11)
RDW: 18.5 % — ABNORMAL HIGH (ref 11.5–15.5)
WBC: 6.3 10*3/uL (ref 4.0–10.5)
nRBC: 0.3 % — ABNORMAL HIGH (ref 0.0–0.2)

## 2023-09-17 LAB — SAMPLE TO BLOOD BANK

## 2023-09-17 MED ORDER — FULVESTRANT 250 MG/5ML IM SOSY
500.0000 mg | PREFILLED_SYRINGE | Freq: Once | INTRAMUSCULAR | Status: AC
  Administered 2023-09-17: 500 mg via INTRAMUSCULAR
  Filled 2023-09-17: qty 10

## 2023-09-17 NOTE — Assessment & Plan Note (Signed)
 Doris Smith is a 73 year-old woman with metastatic breast cancer here today for follow-up and evaluation  Metastatic lobular breast cancer Metastatic lobular breast cancer with gastrointestinal involvement. Transitioned to [employer] due to [place] ineffectiveness and hematological side effects.  Verzenio expected to manage cancer without significant myelosuppression. Normal leukocyte and platelet counts, anemia present. PET scan showed pleural effusion, cytology negative for malignancy. Effusion etiology may include malignancy or cardiac issues.  - Continue Verzenio and Faslodex. - Arrange blood transfusion this week. - Order echocardiogram to evaluate cardiac function. - Monitor for dyspnea and report if it occurs.  Anemia Low hemoglobin may contribute to malaise and anxiety. - Arrange blood transfusion this week.  Pleural effusion Pleural effusion present, cytology negative for malignancy. Etiology unclear, potential causes include malignancy or cardiac issues. - Order echocardiogram to evaluate cardiac function.  Pain management Increased pain, poorly controlled, potentially exacerbating anxiety. Hesitant to adjust analgesics. - Discuss pain management with [name] to consider titration of analgesics. - Monitor pain levels and reassess as needed.  Anxiety and bipolar disorder Increased anxiety, possibly related to uncontrolled pain. Current anxiolytic regimen ineffective. Temporary increase in anxiolytic frequency suggested until therapist consultation. - Increase anxiolytic to three times daily until therapist consultation. - Discuss anxiety management with therapist during upcoming appointment.  Follow-up Requires close monitoring due to complex medical conditions and recent treatment changes. - Schedule follow-up in two weeks to assess treatment efficacy and potential titration.

## 2023-09-17 NOTE — Patient Instructions (Addendum)
 Oxycontin 15mg  every 12 hours 7am and 7pm Oxycodone 10mg /325mg  every day at 11am and 3pm Tylenol Extra Strength every day in between doses of oxycodone when needed ( 9am, 1pm, 5pm).  Continue taking Miralax as needed Continue medications for nausea

## 2023-09-17 NOTE — Progress Notes (Signed)
 [facility] Cancer Follow up:    [name], FNP [address] [place] [address]   DIAGNOSIS:  Cancer Staging  Malignant neoplasm of upper-outer quadrant of right breast in female, estrogen receptor positive (HCC) Staging form: Breast, AJCC 8th Edition - Pathologic: Stage IB (pT3, pN49mi, cM0, G2, ER+, PR+, HER2-) - Signed by Loa Socks, NP on 10/02/2018 Multigene prognostic tests performed: MammaPrint Histologic grading system: 3 grade system - Clinical stage from 12/28/2022: Stage IV (rcT2, cN0, cM1, G3, ER+, PR+, HER2-) - Signed by Rachel Moulds, MD on 12/28/2022 Stage prefix: Recurrence Histologic grading system: 3 grade system Laterality: Right Staged by: Pathologist and managing physician Stage used in treatment planning: Yes National guidelines used in treatment planning: Yes Type of national guideline used in treatment planning: NCCN   SUMMARY OF ONCOLOGIC HISTORY: Oncology History  Malignant neoplasm of upper-outer quadrant of right breast in female, estrogen receptor positive (HCC)  08/08/2018 Initial Diagnosis   status post right breast biopsy 08/02/2018 for a clinical T3 N0, stage IIA invasive lobular carcinoma, grade 2, estrogen receptor strongly positive, progesterone receptor 1% positive, with no HER-2 amplification and an MIB-1 of 1%.             (a) CT scan of the head and chest, without contrast 08/23/2018 showed nonspecific 0.4 cm left lower lobe pulmonary nodule, no definitive metastatic disease             (b) bone scan 08/23/2018 shows multiple spinal areas of abnormal uptake, but             (c) total spinal MRI 09/03/2018 finds bone scan findings to be secondary to degenerative disease, no evidence of metastatic disease.   08/2018 - 10/2022 Anti-estrogen oral therapy   Anastrozole; discontinued 10/14/2018 in preparation for chemotherapy, resumed October 2020   09/16/2018 Surgery   Right mastectomy [name]  [phone]): Invasive Lobular Carcinoma, 10.5 cm, grade 2, negative margins. 1 of 5 lymph nodes positive for carcinoma.   09/16/2018 Miscellaneous   MammaPrint "high risk" suggests a 5-year metastasis free survival of 93% with chemotherapy, with an absolute chemotherapy benefit in the greater than 12% range    09/16/2018 Miscellaneous   Caris testing on mastectomy sample (09/16/2018) showed stable MSI and proficient mismatch repair status, with a low mutational burden; BRCA 1 and 2 were not mutated, PI K3 was not mutated, ER B B2 was not mutated, and AKT 1 was not mutated. 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 Administration: 0.25 mg (11/05/2018), 0.25 mg (11/26/2018), 0.25 mg (12/17/2018), 0.25 mg (01/07/2019), 0.25 mg (01/28/2019), 0.25 mg (02/18/2019), 0.25 mg (03/11/2019), 0.25 mg (04/02/2019)  methotrexate (PF) chemo injection 84 mg, 39.8 mg/m2 = 84.5 mg, Intravenous,  Once, 5 of 5 cycles. Administration: 84 mg (11/05/2018), 84 mg (11/26/2018), 84 mg (12/17/2018), 84 mg (03/11/2019), 84 mg (04/02/2019)  pegfilgrastim-cbqv (UDENYCA) injection 6 mg, 6 mg, Subcutaneous, Once, 6 of 6 cycles. Administration: 6 mg (12/19/2018)  cyclophosphamide (CYTOXAN) 1,260 mg in sodium chloride 0.9 % 250 mL chemo infusion, 600 mg/m2 = 1,260 mg, Intravenous,  Once, 8 of 8 cycle. Administration: 1,260 mg (11/05/2018), 1,260 mg (11/26/2018), 1,260 mg (12/17/2018), 1,260 mg (01/07/2019), 1,260 mg (01/28/2019), 1,260 mg (02/18/2019), 1,260 mg (03/11/2019), 1,260 mg (04/02/2019)  fluorouracil (ADRUCIL) chemo injection 1,250  mg, 600 mg/m2 = 1,250 mg, Intravenous,  Once, 8 of 8 cycles. Administration: 1,250 mg (11/05/2018), 1,250 mg (11/26/2018), 1,250 mg (12/17/2018), 1,250 mg (01/07/2019), 1,250 mg (01/28/2019), 1,250 mg (02/18/2019), 1,250 mg (03/11/2019), 1,250 mg  (04/02/2019).    12/31/2018 - 02/19/2019 Radiation Therapy   The patient initially received a dose of 50.4 Gy in 28 fractions to the chest wall and supraclavicular region. This was delivered using a 3-D conformal, 4 field technique. The patient then received a boost to the mastectomy scar. This delivered an additional 10 Gy in 5 fractions using an en face electron field. The total dose was 60.4 Gy.   09/12/2022 Imaging   Bone scan on 09/12/2022 shows uptake in ribs, manubrium. Widespread osseous metastasis confirmed on PET scan that was completed on October 06, 2022. Right iliac crest biopsy demonstrated metastatic carcinoma consistent with patient's known breast carcinoma. ER 90% positive, PR 90% positive, Ki67 10%, HER2 negative.    10/10/2022 Treatment Plan Change   Faslodex beginning 10/10/2022; Ibrance beginning 12/20/2022; Zometa every 12 weeks 11/07/2022    10/23/2022 - 11/03/2022 Radiation Therapy   Palliative Radiation 10/23/2022-11/03/2022:  left chest wall, lower T spine, and right proximal hip/pelvis were each treated to 30 GY in 10 fractions.      CURRENT THERAPY: Faslodex, Mora Appl  INTERVAL HISTORY:  [place]hyen [place] 73 y.o. female returns for follow-up and evaluation with her sister and husband  Discussed the use of AI scribe software for clinical note transcription with the patient, who gave verbal consent to proceed  The patient, with metastatic lobular breast cancer, presents with uncontrolled pain and anxiety. She is accompanied by [name] and her sister, her caregiver.  She has been experiencing significant neck pain for a couple of days, which is worse than during her last visit and remains uncontrolled despite taking her pain medication at night. She feels generally unwell and unable to articulate her symptoms, stating, 'I just don't feel good at all today.'  She is currently taking anxiety medication at a dose of one milligram, but finds it ineffective. She recalls a different  medication for her bipolar disorder that previously helped with her anxiety but cannot remember its name. She has a telephone visit scheduled with her therapist, Donnie Aho, to discuss anxiety management. Her caregiver notes significant mood changes, describing her temperament as 'really off the wall.'  She has a history of metastatic lobular cancer in the gut and is currently on Verzenio and Faslodex. She started Verzenio about two weeks ago and reports nausea and dry heaves, which she associates with her uncontrolled pain. She was previously on Ibrance, which was ineffective at a low dose. Her blood work shows normal white blood cell and platelet counts, but low hemoglobin, which may necessitate a blood transfusion.  She has been unable to eat properly, resulting in a weight loss of six to seven pounds. She has a history of a gastrointestinal bleed and fluid accumulation in her lungs, which was recently drained. The fluid tested negative for cancer cells. No diarrhea, but she reports nausea and dry heaves.  She has not had a heart check in several years, and there is a consideration to evaluate her heart function to rule out other causes of fluid accumulation.  Rest of the pertinent 10 point ROS reviewed and negative  Patient Active Problem List   Diagnosis Date Noted   Malnutrition of moderate degree 08/17/2023   GAVE (gastric antral vascular ectasia) 08/17/2023   Gastric erosion 08/17/2023  Melena 08/16/2023   Anemia associated with acute blood loss 08/16/2023   Peripheral edema 12/29/2022   Restless leg syndrome 10/03/2021   Anxiety associated with depression 06/10/2020   Port-A-Cath in place 11/12/2018   Malignant neoplasm of upper-outer quadrant of right breast in female, estrogen receptor positive (HCC) 08/08/2018   Right knee pain 03/01/2015   Aortic atherosclerosis (HCC) 12/28/2014   Obesity (BMI 30-39.9) 04/09/2014   Insomnia due to stress 04/09/2014   Atrial fibrillation  (HCC) 10/01/2013   GERD (gastroesophageal reflux disease) 10/01/2013   Bipolar disorder (HCC) 09/27/2007   Essential hypertension 09/27/2007   ALLERGIC RHINITIS 09/27/2007   IRRITABLE BOWEL SYNDROME 09/27/2007   Arthropathy 09/27/2007   DEGENERATIVE DISC DISEASE, LUMBOSACRAL SPINE 09/27/2007    is allergic to iohexol, codeine, palonosetron, prednisone, sulfonamide derivatives, celecoxib, sulfa antibiotics, and vitamin b12.  MEDICAL HISTORY: Past Medical History:  Diagnosis Date   A-fib (HCC) 11/2012   PAF   Anxiety    takes Ativan   Asthma 04/07/2011   dx   Bipolar disorder (HCC)    Cancer (HCC)    Right breast   Depression    Early cataracts, bilateral    Fatty liver    Fibromyalgia    GERD (gastroesophageal reflux disease)    Irritable bowel syndrome    Mental disorder    dx bipolar   PONV (postoperative nausea and vomiting)    Sleep apnea 04/2011   does not wear CPAP   Tardive dyskinesia     SURGICAL HISTORY: Past Surgical History:  Procedure Laterality Date   ABDOMINAL HYSTERECTOMY     BIOPSY  08/17/2023   Procedure: BIOPSY;  Surgeon: Iva Boop, MD;  Location: [facility];  Service: Gastroenterology;;   COLONOSCOPY     DILATION AND CURETTAGE OF UTERUS  1981   abnormal pap   ESOPHAGOGASTRODUODENOSCOPY (EGD) WITH PROPOFOL N/A 08/17/2023   Procedure: ESOPHAGOGASTRODUODENOSCOPY (EGD) WITH PROPOFOL;  Surgeon: Iva Boop, MD;  Location: [facility];  Service: Gastroenterology;  Laterality: N/A;   HOT HEMOSTASIS N/A 08/17/2023   Procedure: HOT HEMOSTASIS (ARGON PLASMA COAGULATION/BICAP);  Surgeon: Iva Boop, MD;  Location: [facility];  Service: Gastroenterology;  Laterality: N/A;   IR THORACENTESIS ASP PLEURAL SPACE W/IMG GUIDE  09/13/2023   KNEE ARTHROSCOPY Left 2007   x 2   LUMBAR LAMINECTOMY/DECOMPRESSION MICRODISCECTOMY  05/31/2011   Procedure: LUMBAR LAMINECTOMY/DECOMPRESSION MICRODISCECTOMY;  Surgeon: Javier Docker;  Location: [facility];  Service:  Orthopedics;  Laterality: Left;  Decompression Lumbar four to five and  Lumbar five to Sacral one on Left  (X-Ray)   MASTECTOMY W/ SENTINEL NODE BIOPSY Right 09/16/2018   Procedure: RIGHT MASTECTOMY WITH RIGHT AXILLARY SENTINEL LYMPH NODE BIOPSY;  Surgeon: Ovidio Kin, MD;  Location: [facility] OR;  Service: General;  Laterality: Right;   PARTIAL HYSTERECTOMY  1982   PORTACATH PLACEMENT Left 10/31/2018   Procedure: INSERTION PORT-A-CATH WITH ULTRASOUND;  Surgeon: Ovidio Kin, MD;  Location: [facility];  Service: General;  Laterality: Left;   TONSILLECTOMY     as child   TOTAL KNEE ARTHROPLASTY Left 12/18/2005    SOCIAL HISTORY: Social History   Socioeconomic History   Marital status: Married    Spouse name: [name]   Number of children: 2   Years of education: 12   Highest education level: Not on file  Occupational History   Occupation: Disabled  Tobacco Use   Smoking status: Never   Smokeless tobacco: Never  Vaping Use   Vaping status: Never Used  Substance and Sexual Activity   Alcohol use: No   Drug use: No   Sexual activity: Yes    Birth control/protection: Post-menopausal, Surgical  Other Topics Concern   Not on file  Social History Narrative   Tea daily.  Rarely has caffeine    Social Drivers of Corporate investment banker Strain: Low Risk  (08/18/2022)   Overall Financial Resource Strain (CARDIA)    Difficulty of Paying Living Expenses: Not hard at all  Food Insecurity: No Food Insecurity (08/16/2023)   Hunger Vital Sign    Worried About Running Out of Food in the Last Year: Never true    Ran Out of Food in the Last Year: Never true  Transportation Needs: No Transportation Needs (08/16/2023)   PRAPARE - Administrator, Civil Service (Medical): No    Lack of Transportation (Non-Medical): No  Physical Activity: Inactive (08/18/2022)   Exercise Vital Sign    Days of Exercise per Week: 0 days    Minutes of Exercise per Session: 0 min  Stress: No  Stress Concern Present (08/18/2022)   [employer] of Occupational Health - Occupational Stress Questionnaire    Feeling of Stress : Not at all  Social Connections: Moderately Integrated (08/16/2023)   Social Connection and Isolation Panel [NHANES]    Frequency of Communication with Friends and Family: More than three times a week    Frequency of Social Gatherings with Friends and Family: More than three times a week    Attends Religious Services: More than 4 times per year    Active Member of [employer] or Organizations: No    Attends Banker Meetings: Never    Marital Status: Married  Catering manager Violence: Not At Risk (08/16/2023)   Humiliation, Afraid, Rape, and Kick questionnaire    Fear of Current or Ex-Partner: No    Emotionally Abused: No    Physically Abused: No    Sexually Abused: No    FAMILY HISTORY: Family History  Problem Relation Age of Onset   Anesthesia problems Mother    Heart disease Mother        CHF, atrial fib   Heart failure Mother    Anesthesia problems Sister    Colon polyps Father    Heart disease Father        "Fluid around the heart"   Hypertension Sister    Breast cancer Maternal Aunt    Colon cancer Maternal Aunt    Prostate cancer Neg Hx    Ovarian cancer Neg Hx      PHYSICAL EXAMINATION   Vitals:   09/17/23 1254  BP: (!) 123/48  Pulse: (!) 111  Resp: 16  Temp: 98.4 F (36.9 C)  SpO2: 100%    General appearance: appears restless today. No acute distress otherwise.  LABORATORY DATA:  CBC    Component Value Date/Time   WBC 6.3 09/17/2023 1217   RBC 2.54 (L) 09/17/2023 1217   HGB 7.3 (L) 09/17/2023 1217   HGB 8.7 (L) 06/04/2023 1506   HGB 12.0 06/05/2022 1024   HCT 23.5 (L) 09/17/2023 1217   HCT 37.4 06/05/2022 1024   PLT 363 09/17/2023 1217   PLT 154 06/04/2023 1506   PLT 251 06/05/2022 1024   MCV 92.5 09/17/2023 1217   MCV 86 06/05/2022 1024   MCH 28.7 09/17/2023 1217   MCHC 31.1 09/17/2023 1217   RDW  18.5 (H) 09/17/2023 1217   RDW 15.5 (H) 06/05/2022 1024   LYMPHSABS 0.3 (  L) 09/17/2023 1217   LYMPHSABS 0.9 06/05/2022 1024   MONOABS 0.3 09/17/2023 1217   EOSABS 0.0 09/17/2023 1217   EOSABS 0.1 06/05/2022 1024   BASOSABS 0.0 09/17/2023 1217   BASOSABS 0.1 06/05/2022 1024    CMP     Component Value Date/Time   NA 140 09/17/2023 1217   NA 145 (H) 06/05/2022 1024   K 3.7 09/17/2023 1217   CL 102 09/17/2023 1217   CO2 30 09/17/2023 1217   GLUCOSE 126 (H) 09/17/2023 1217   BUN 10 09/17/2023 1217   BUN 17 06/05/2022 1024   CREATININE 0.66 09/17/2023 1217   CREATININE 0.45 08/28/2023 1555   CALCIUM 8.2 (L) 09/17/2023 1217   PROT 5.9 (L) 09/17/2023 1217   PROT 6.6 06/05/2022 1024   ALBUMIN 2.9 (L) 09/17/2023 1217   ALBUMIN 4.0 06/05/2022 1024   AST 28 09/17/2023 1217   AST 39 08/28/2023 1555   ALT 26 09/17/2023 1217   ALT 34 08/28/2023 1555   ALKPHOS 212 (H) 09/17/2023 1217   BILITOT 0.5 09/17/2023 1217   BILITOT 0.7 08/28/2023 1555   GFRNONAA >60 09/17/2023 1217   GFRNONAA >60 08/28/2023 1555   GFRAA 81 02/03/2020 1049   GFRAA >60 12/19/2018 1434     ASSESSMENT and THERAPY PLAN:   Malignant neoplasm of upper-outer quadrant of right breast in female, estrogen receptor positive (HCC) [name] is a 73 year-old woman with metastatic breast cancer here today for follow-up and evaluation  Metastatic lobular breast cancer Metastatic lobular breast cancer with gastrointestinal involvement. Transitioned to [employer] due to [place] ineffectiveness and hematological side effects.  Verzenio expected to manage cancer without significant myelosuppression. Normal leukocyte and platelet counts, anemia present. PET scan showed pleural effusion, cytology negative for malignancy. Effusion etiology may include malignancy or cardiac issues.  - Continue Verzenio and Faslodex. - Arrange blood transfusion this week. - Order echocardiogram to evaluate cardiac function. - Monitor for dyspnea and  report if it occurs.  Anemia Low hemoglobin may contribute to malaise and anxiety. - Arrange blood transfusion this week.  Pleural effusion Pleural effusion present, cytology negative for malignancy. Etiology unclear, potential causes include malignancy or cardiac issues. - Order echocardiogram to evaluate cardiac function.  Pain management Increased pain, poorly controlled, potentially exacerbating anxiety. Hesitant to adjust analgesics. - Discuss pain management with [name] to consider titration of analgesics. - Monitor pain levels and reassess as needed.  Anxiety and bipolar disorder Increased anxiety, possibly related to uncontrolled pain. Current anxiolytic regimen ineffective. Temporary increase in anxiolytic frequency suggested until therapist consultation. - Increase anxiolytic to three times daily until therapist consultation. - Discuss anxiety management with therapist during upcoming appointment.  Follow-up Requires close monitoring due to complex medical conditions and recent treatment changes. - Schedule follow-up in two weeks to assess treatment efficacy and potential titration.     All questions were answered. The patient knows to call the clinic with any problems, questions or concerns. We can certainly see the patient much sooner if necessary.  Total encounter time:40 minutes*in face-to-face visit time, chart review, lab review, care coordination, order entry, and documentation of the encounter time.   *Total Encounter Time as defined by the Centers for Medicare and Medicaid Services includes, in addition to the face-to-face time of a patient visit (documented in the note above) non-face-to-face time: obtaining and reviewing outside history, ordering and reviewing medications, tests or procedures, care coordination (communications with other health care professionals or caregivers) and documentation in the medical record.

## 2023-09-18 ENCOUNTER — Other Ambulatory Visit: Payer: Self-pay | Admitting: *Deleted

## 2023-09-18 ENCOUNTER — Encounter: Payer: Self-pay | Admitting: *Deleted

## 2023-09-18 DIAGNOSIS — F410 Panic disorder [episodic paroxysmal anxiety] without agoraphobia: Secondary | ICD-10-CM | POA: Diagnosis not present

## 2023-09-18 DIAGNOSIS — F4310 Post-traumatic stress disorder, unspecified: Secondary | ICD-10-CM | POA: Diagnosis not present

## 2023-09-18 DIAGNOSIS — D649 Anemia, unspecified: Secondary | ICD-10-CM

## 2023-09-18 DIAGNOSIS — F312 Bipolar disorder, current episode manic severe with psychotic features: Secondary | ICD-10-CM | POA: Diagnosis not present

## 2023-09-18 LAB — CANCER ANTIGEN 27.29: CA 27.29: 976.7 U/mL — ABNORMAL HIGH (ref 0.0–38.6)

## 2023-09-18 LAB — PREPARE RBC (CROSSMATCH)

## 2023-09-20 ENCOUNTER — Inpatient Hospital Stay

## 2023-09-20 VITALS — BP 123/59 | HR 73 | Temp 98.2°F | Resp 18 | Ht 64.0 in

## 2023-09-20 DIAGNOSIS — Z1721 Progesterone receptor positive status: Secondary | ICD-10-CM | POA: Diagnosis not present

## 2023-09-20 DIAGNOSIS — Z5111 Encounter for antineoplastic chemotherapy: Secondary | ICD-10-CM | POA: Diagnosis not present

## 2023-09-20 DIAGNOSIS — Z170 Estrogen receptor positive status [ER+]: Secondary | ICD-10-CM

## 2023-09-20 DIAGNOSIS — D649 Anemia, unspecified: Secondary | ICD-10-CM | POA: Diagnosis not present

## 2023-09-20 DIAGNOSIS — C50411 Malignant neoplasm of upper-outer quadrant of right female breast: Secondary | ICD-10-CM | POA: Diagnosis not present

## 2023-09-20 DIAGNOSIS — Z79899 Other long term (current) drug therapy: Secondary | ICD-10-CM | POA: Diagnosis not present

## 2023-09-20 DIAGNOSIS — C7951 Secondary malignant neoplasm of bone: Secondary | ICD-10-CM | POA: Diagnosis not present

## 2023-09-20 DIAGNOSIS — Z1732 Human epidermal growth factor receptor 2 negative status: Secondary | ICD-10-CM | POA: Diagnosis not present

## 2023-09-20 DIAGNOSIS — R110 Nausea: Secondary | ICD-10-CM

## 2023-09-20 MED ORDER — ACETAMINOPHEN 325 MG PO TABS
650.0000 mg | ORAL_TABLET | Freq: Once | ORAL | Status: AC
  Administered 2023-09-20: 650 mg via ORAL
  Filled 2023-09-20: qty 2

## 2023-09-20 MED ORDER — SODIUM CHLORIDE 0.9% IV SOLUTION
250.0000 mL | INTRAVENOUS | Status: DC
  Administered 2023-09-20: 100 mL via INTRAVENOUS

## 2023-09-20 MED ORDER — DIPHENHYDRAMINE HCL 25 MG PO CAPS
25.0000 mg | ORAL_CAPSULE | Freq: Once | ORAL | Status: AC
Start: 2023-09-20 — End: 2023-09-20
  Administered 2023-09-20: 25 mg via ORAL
  Filled 2023-09-20: qty 1

## 2023-09-20 MED ORDER — PROCHLORPERAZINE MALEATE 10 MG PO TABS
10.0000 mg | ORAL_TABLET | Freq: Once | ORAL | Status: AC
  Administered 2023-09-20: 10 mg via ORAL
  Filled 2023-09-20: qty 1

## 2023-09-20 NOTE — Progress Notes (Signed)
 Pt V/S checked post 15 min blood transfusion start, temp was noted to be 99.9, infusion paused, pt had no other complaints at this time and felt fine Rechecked a minute later was was 99.1, oral tylenol given per [place], [place] and transfusion resumed. Pt educated to press call light if she felt any differently, pt and husband verbalized understanding.

## 2023-09-21 ENCOUNTER — Other Ambulatory Visit: Payer: Self-pay

## 2023-09-21 LAB — TYPE AND SCREEN
ABO/RH(D): O POS
Antibody Screen: NEGATIVE
Unit division: 0
Unit division: 0

## 2023-09-21 LAB — BPAM RBC
Blood Product Expiration Date: 202504072359
Blood Product Unit Number: 202504092359
ISSUE DATE / TIME: 202503130957
PRODUCT CODE: 202503130957
PRODUCT CODE: 202504072359
Unit Type and Rh: 5100
Unit Type and Rh: 202504092359
Unit Type and Rh: 5100
Unit Type and Rh: 5100

## 2023-09-24 ENCOUNTER — Other Ambulatory Visit: Payer: Self-pay

## 2023-09-24 ENCOUNTER — Other Ambulatory Visit (HOSPITAL_COMMUNITY): Payer: Self-pay

## 2023-09-24 ENCOUNTER — Telehealth: Payer: Self-pay | Admitting: *Deleted

## 2023-09-24 DIAGNOSIS — C50411 Malignant neoplasm of upper-outer quadrant of right female breast: Secondary | ICD-10-CM

## 2023-09-24 DIAGNOSIS — G893 Neoplasm related pain (acute) (chronic): Secondary | ICD-10-CM

## 2023-09-24 DIAGNOSIS — M898X9 Other specified disorders of bone, unspecified site: Secondary | ICD-10-CM

## 2023-09-24 DIAGNOSIS — Z515 Encounter for palliative care: Secondary | ICD-10-CM

## 2023-09-24 MED ORDER — OXYCODONE-ACETAMINOPHEN 10-325 MG PO TABS
1.0000 | ORAL_TABLET | ORAL | 0 refills | Status: DC | PRN
  Filled 2023-09-24: qty 60, 10d supply, fill #0

## 2023-09-24 MED ORDER — OXYCODONE HCL ER 15 MG PO T12A
15.0000 mg | EXTENDED_RELEASE_TABLET | Freq: Two times a day (BID) | ORAL | 0 refills | Status: DC
  Filled 2023-09-24: qty 60, 30d supply, fill #0

## 2023-09-24 NOTE — Telephone Encounter (Signed)
 Pt called for medication refill, see associated orders

## 2023-09-24 NOTE — Telephone Encounter (Signed)
 This RN spoke with pt per her call - stating she has been having significant nausea for the past 3 days that she feels started after using the Carafate that was prescribed upon d/c from the hospital.  She states decreased ability to take in food and liquids "just seems to not want to go down well"  She denies any dark or tarry stools and actually states she is constipated "just passing small hard balls"  Pt has antinausea medications available.  Per discussion of above and need to first address the constipation issue - pt will institute use of duccolax/miralax.  Pt will call this RN later to give update and obtain further recommendations if needed.

## 2023-09-25 ENCOUNTER — Other Ambulatory Visit: Payer: Self-pay | Admitting: *Deleted

## 2023-09-25 ENCOUNTER — Encounter: Payer: Self-pay | Admitting: *Deleted

## 2023-09-25 ENCOUNTER — Ambulatory Visit (HOSPITAL_COMMUNITY)
Admission: RE | Admit: 2023-09-25 | Discharge: 2023-09-25 | Disposition: A | Discharge status: Home / Self Care | Source: Ambulatory Visit | Attending: Hematology and Oncology | Admitting: Hematology and Oncology

## 2023-09-25 ENCOUNTER — Inpatient Hospital Stay

## 2023-09-25 DIAGNOSIS — J910 Malignant pleural effusion: Secondary | ICD-10-CM

## 2023-09-25 DIAGNOSIS — Z170 Estrogen receptor positive status [ER+]: Secondary | ICD-10-CM

## 2023-09-25 DIAGNOSIS — Z1732 Human epidermal growth factor receptor 2 negative status: Secondary | ICD-10-CM | POA: Diagnosis not present

## 2023-09-25 DIAGNOSIS — D649 Anemia, unspecified: Secondary | ICD-10-CM | POA: Diagnosis not present

## 2023-09-25 DIAGNOSIS — R918 Other nonspecific abnormal finding of lung field: Secondary | ICD-10-CM | POA: Diagnosis not present

## 2023-09-25 DIAGNOSIS — C50411 Malignant neoplasm of upper-outer quadrant of right female breast: Secondary | ICD-10-CM | POA: Insufficient documentation

## 2023-09-25 DIAGNOSIS — Z5111 Encounter for antineoplastic chemotherapy: Secondary | ICD-10-CM | POA: Diagnosis not present

## 2023-09-25 DIAGNOSIS — R0602 Shortness of breath: Secondary | ICD-10-CM | POA: Diagnosis not present

## 2023-09-25 DIAGNOSIS — C7951 Secondary malignant neoplasm of bone: Secondary | ICD-10-CM | POA: Diagnosis not present

## 2023-09-25 DIAGNOSIS — K449 Diaphragmatic hernia without obstruction or gangrene: Secondary | ICD-10-CM | POA: Diagnosis not present

## 2023-09-25 DIAGNOSIS — Z79899 Other long term (current) drug therapy: Secondary | ICD-10-CM | POA: Diagnosis not present

## 2023-09-25 DIAGNOSIS — Z1721 Progesterone receptor positive status: Secondary | ICD-10-CM | POA: Diagnosis not present

## 2023-09-25 LAB — CBC WITH DIFFERENTIAL (CANCER CENTER ONLY)
Abs Immature Granulocytes: 0.03 10*3/uL (ref 0.00–0.07)
Basophils Absolute: 0 10*3/uL (ref 0.0–0.1)
Basophils Relative: 1 %
Eosinophils Absolute: 0 10*3/uL (ref 0.0–0.5)
Eosinophils Relative: 0 %
HCT: 34.1 % — ABNORMAL LOW (ref 36.0–46.0)
Hemoglobin: 10.9 g/dL — ABNORMAL LOW (ref 12.0–15.0)
Immature Granulocytes: 1 %
Lymphocytes Relative: 6 %
Lymphs Abs: 0.3 10*3/uL — ABNORMAL LOW (ref 0.7–4.0)
MCH: 29.5 pg (ref 26.0–34.0)
MCHC: 32 g/dL (ref 30.0–36.0)
MCV: 92.2 fL (ref 80.0–100.0)
Monocytes Absolute: 0.4 10*3/uL (ref 0.1–1.0)
Monocytes Relative: 8 %
Neutro Abs: 4.3 10*3/uL (ref 1.7–7.7)
Neutrophils Relative %: 84 %
Platelet Count: 141 10*3/uL — ABNORMAL LOW (ref 150–400)
RBC: 3.7 MIL/uL — ABNORMAL LOW (ref 3.87–5.11)
RDW: 18.2 % — ABNORMAL HIGH (ref 11.5–15.5)
WBC Count: 5.1 10*3/uL (ref 4.0–10.5)
nRBC: 0 % (ref 0.0–0.2)

## 2023-09-25 LAB — SAMPLE TO BLOOD BANK

## 2023-09-25 MED ORDER — OLANZAPINE 2.5 MG PO TABS: 2.5000 mg | ORAL_TABLET | Freq: Every day | ORAL | 1 refills | Status: DC

## 2023-09-28 ENCOUNTER — Other Ambulatory Visit: Payer: Self-pay

## 2023-09-28 NOTE — Progress Notes (Signed)
 Specialty Pharmacy Refill Coordination Note  Doris Smith is a 73 y.o. female contacted today regarding refills of specialty medication(s) Abemaciclib Kathlen Mody)   Patient requested Delivery   Delivery date: 10/02/23   Verified address: [address]  [place], [place] [address]   Medication will be filled on 03.24.25.

## 2023-09-30 ENCOUNTER — Other Ambulatory Visit: Payer: Self-pay | Admitting: Nurse Practitioner

## 2023-09-30 DIAGNOSIS — G2581 Restless legs syndrome: Secondary | ICD-10-CM

## 2023-10-01 ENCOUNTER — Other Ambulatory Visit (HOSPITAL_COMMUNITY): Payer: Self-pay

## 2023-10-01 ENCOUNTER — Other Ambulatory Visit: Payer: Self-pay

## 2023-10-01 NOTE — Progress Notes (Unsigned)
 [facility] Cancer Follow up:    Doris Pierini, FNP [address] [place] [address]   DIAGNOSIS:  Cancer Staging  Malignant neoplasm of upper-outer quadrant of right breast in female, estrogen receptor positive (HCC) Staging form: Breast, AJCC 8th Edition - Pathologic: Stage IB (pT3, pN30mi, cM0, G2, ER+, PR+, HER2-) - Signed by Loa Socks, NP on 10/02/2018 Multigene prognostic tests performed: MammaPrint Histologic grading system: 3 grade system - Clinical stage from 12/28/2022: Stage IV (rcT2, cN0, cM1, G3, ER+, PR+, HER2-) - Signed by Rachel Moulds, MD on 12/28/2022 Stage prefix: Recurrence Histologic grading system: 3 grade system Laterality: Right Staged by: Pathologist and managing physician Stage used in treatment planning: Yes National guidelines used in treatment planning: Yes Type of national guideline used in treatment planning: NCCN   SUMMARY OF ONCOLOGIC HISTORY: Oncology History  Malignant neoplasm of upper-outer quadrant of right breast in female, estrogen receptor positive (HCC)  08/08/2018 Initial Diagnosis   status post right breast biopsy 08/02/2018 for a clinical T3 N0, stage IIA invasive lobular carcinoma, grade 2, estrogen receptor strongly positive, progesterone receptor 1% positive, with no HER-2 amplification and an MIB-1 of 1%.             (a) CT scan of the head and chest, without contrast 08/23/2018 showed nonspecific 0.4 cm left lower lobe pulmonary nodule, no definitive metastatic disease             (b) bone scan 08/23/2018 shows multiple spinal areas of abnormal uptake, but             (c) total spinal MRI 09/03/2018 finds bone scan findings to be secondary to degenerative disease, no evidence of metastatic disease.   08/2018 - 10/2022 Anti-estrogen oral therapy   Anastrozole; discontinued 10/14/2018 in preparation for chemotherapy, resumed October 2020   09/16/2018 Surgery   Right mastectomy [name]  [phone]): Invasive Lobular Carcinoma, 10.5 cm, grade 2, negative margins. 1 of 5 lymph nodes positive for carcinoma.   09/16/2018 Miscellaneous   MammaPrint "high risk" suggests a 5-year metastasis free survival of 93% with chemotherapy, with an absolute chemotherapy benefit in the greater than 12% range    09/16/2018 Miscellaneous   Caris testing on mastectomy sample (09/16/2018) showed stable MSI and proficient mismatch repair status, with a low mutational burden; BRCA 1 and 2 were not mutated, PI K3 was not mutated, ER B B2 was not mutated, and AKT 1 was not mutated. 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 Administration: 0.25 mg (11/05/2018), 0.25 mg (11/26/2018), 0.25 mg (12/17/2018), 0.25 mg (01/07/2019), 0.25 mg (01/28/2019), 0.25 mg (02/18/2019), 0.25 mg (03/11/2019), 0.25 mg (04/02/2019)  methotrexate (PF) chemo injection 84 mg, 39.8 mg/m2 = 84.5 mg, Intravenous,  Once, 5 of 5 cycles. Administration: 84 mg (11/05/2018), 84 mg (11/26/2018), 84 mg (12/17/2018), 84 mg (03/11/2019), 84 mg (04/02/2019)  pegfilgrastim-cbqv (UDENYCA) injection 6 mg, 6 mg, Subcutaneous, Once, 6 of 6 cycles. Administration: 6 mg (12/19/2018)  cyclophosphamide (CYTOXAN) 1,260 mg in sodium chloride 0.9 % 250 mL chemo infusion, 600 mg/m2 = 1,260 mg, Intravenous,  Once, 8 of 8 cycle. Administration: 1,260 mg (11/05/2018), 1,260 mg (11/26/2018), 1,260 mg (12/17/2018), 1,260 mg (01/07/2019), 1,260 mg (01/28/2019), 1,260 mg (02/18/2019), 1,260 mg (03/11/2019), 1,260 mg (04/02/2019)  fluorouracil (ADRUCIL) chemo injection 1,250  mg, 600 mg/m2 = 1,250 mg, Intravenous,  Once, 8 of 8 cycles. Administration: 1,250 mg (11/05/2018), 1,250 mg (11/26/2018), 1,250 mg (12/17/2018), 1,250 mg (01/07/2019), 1,250 mg (01/28/2019), 1,250 mg (02/18/2019), 1,250 mg (03/11/2019), 1,250 mg  (04/02/2019).    12/31/2018 - 02/19/2019 Radiation Therapy   The patient initially received a dose of 50.4 Gy in 28 fractions to the chest wall and supraclavicular region. This was delivered using a 3-D conformal, 4 field technique. The patient then received a boost to the mastectomy scar. This delivered an additional 10 Gy in 5 fractions using an en face electron field. The total dose was 60.4 Gy.   09/12/2022 Imaging   Bone scan on 09/12/2022 shows uptake in ribs, manubrium. Widespread osseous metastasis confirmed on PET scan that was completed on October 06, 2022. Right iliac crest biopsy demonstrated metastatic carcinoma consistent with patient's known breast carcinoma. ER 90% positive, PR 90% positive, Ki67 10%, HER2 negative.    10/10/2022 Treatment Plan Change   Faslodex beginning 10/10/2022; Ibrance beginning 12/20/2022; Zometa every 12 weeks 11/07/2022    10/23/2022 - 11/03/2022 Radiation Therapy   Palliative Radiation 10/23/2022-11/03/2022:  left chest wall, lower T spine, and right proximal hip/pelvis were each treated to 30 GY in 10 fractions.      CURRENT THERAPY: Faslodex, Mora Appl  INTERVAL HISTORY:  Doris Smith 73 y.o. female returns for follow-up and evaluation with her sister and husband  Discussed the use of AI scribe software for clinical note transcription with the patient, who gave verbal consent to proceed    Rest of the pertinent 10 point ROS reviewed and negative  Patient Active Problem List   Diagnosis Date Noted   Malnutrition of moderate degree 08/17/2023   GAVE (gastric antral vascular ectasia) 08/17/2023   Gastric erosion 08/17/2023   Melena 08/16/2023   Anemia associated with acute blood loss 08/16/2023   Peripheral edema 12/29/2022   Restless leg syndrome 10/03/2021   Anxiety associated with depression 06/10/2020   Port-A-Cath in place 11/12/2018   Malignant neoplasm of upper-outer quadrant of right breast in female, estrogen receptor positive (HCC)  08/08/2018   Right knee pain 03/01/2015   Aortic atherosclerosis (HCC) 12/28/2014   Obesity (BMI 30-39.9) 04/09/2014   Insomnia due to stress 04/09/2014   Atrial fibrillation (HCC) 10/01/2013   GERD (gastroesophageal reflux disease) 10/01/2013   Bipolar disorder (HCC) 09/27/2007   Essential hypertension 09/27/2007   ALLERGIC RHINITIS 09/27/2007   IRRITABLE BOWEL SYNDROME 09/27/2007   Arthropathy 09/27/2007   DEGENERATIVE DISC DISEASE, LUMBOSACRAL SPINE 09/27/2007    is allergic to iohexol, codeine, palonosetron, prednisone, sulfonamide derivatives, celecoxib, sulfa antibiotics, and vitamin b12.  MEDICAL HISTORY: Past Medical History:  Diagnosis Date   A-fib (HCC) 11/2012   PAF   Anxiety    takes Ativan   Asthma 04/07/2011   dx   Bipolar disorder (HCC)    Cancer (HCC)    Right breast   Depression    Early cataracts, bilateral    Fatty liver    Fibromyalgia    GERD (gastroesophageal reflux disease)    Irritable bowel syndrome    Mental disorder    dx bipolar   PONV (postoperative nausea and vomiting)    Sleep apnea 04/2011   does not wear CPAP   Tardive dyskinesia     SURGICAL HISTORY: Past Surgical History:  Procedure Laterality Date   ABDOMINAL HYSTERECTOMY     BIOPSY  08/17/2023   Procedure: BIOPSY;  Surgeon: Iva Boop, MD;  Location:  [facility];  Service: Gastroenterology;;   COLONOSCOPY     DILATION AND CURETTAGE OF UTERUS  1981   abnormal pap   ESOPHAGOGASTRODUODENOSCOPY (EGD) WITH PROPOFOL N/A 08/17/2023   Procedure: ESOPHAGOGASTRODUODENOSCOPY (EGD) WITH PROPOFOL;  Surgeon: Iva Boop, MD;  Location: [facility];  Service: Gastroenterology;  Laterality: N/A;   HOT HEMOSTASIS N/A 08/17/2023   Procedure: HOT HEMOSTASIS (ARGON PLASMA COAGULATION/BICAP);  Surgeon: Iva Boop, MD;  Location: [facility];  Service: Gastroenterology;  Laterality: N/A;   IR THORACENTESIS ASP PLEURAL SPACE W/IMG GUIDE  09/13/2023   KNEE ARTHROSCOPY Left 2007   x 2    LUMBAR LAMINECTOMY/DECOMPRESSION MICRODISCECTOMY  05/31/2011   Procedure: LUMBAR LAMINECTOMY/DECOMPRESSION MICRODISCECTOMY;  Surgeon: Javier Docker;  Location: [facility];  Service: Orthopedics;  Laterality: Left;  Decompression Lumbar four to five and  Lumbar five to Sacral one on Left  (X-Ray)   MASTECTOMY W/ SENTINEL NODE BIOPSY Right 09/16/2018   Procedure: RIGHT MASTECTOMY WITH RIGHT AXILLARY SENTINEL LYMPH NODE BIOPSY;  Surgeon: Ovidio Kin, MD;  Location: [facility];  Service: General;  Laterality: Right;   PARTIAL HYSTERECTOMY  1982   PORTACATH PLACEMENT Left 10/31/2018   Procedure: INSERTION PORT-A-CATH WITH ULTRASOUND;  Surgeon: Ovidio Kin, MD;  Location: [facility];  Service: General;  Laterality: Left;   TONSILLECTOMY     as child   TOTAL KNEE ARTHROPLASTY Left 12/18/2005    SOCIAL HISTORY: Social History   Socioeconomic History   Marital status: Married    Spouse name: [name]   Number of children: 2   Years of education: 12   Highest education level: Not on file  Occupational History   Occupation: Disabled  Tobacco Use   Smoking status: Never   Smokeless tobacco: Never  Vaping Use   Vaping status: Never Used  Substance and Sexual Activity   Alcohol use: No   Drug use: No   Sexual activity: Yes    Birth control/protection: Post-menopausal, Surgical  Other Topics Concern   Not on file  Social History Narrative   Tea daily.  Rarely has caffeine    Social Drivers of Corporate investment banker Strain: Low Risk  (08/18/2022)   Overall Financial Resource Strain (CARDIA)    Difficulty of Paying Living Expenses: Not hard at all  Food Insecurity: No Food Insecurity (08/16/2023)   Hunger Vital Sign    Worried About Running Out of Food in the Last Year: Never true    Ran Out of Food in the Last Year: Never true  Transportation Needs: No Transportation Needs (08/16/2023)   PRAPARE - Administrator, Civil Service (Medical): No    Lack of Transportation  (Non-Medical): No  Physical Activity: Inactive (08/18/2022)   Exercise Vital Sign    Days of Exercise per Week: 0 days    Minutes of Exercise per Session: 0 min  Stress: No Stress Concern Present (08/18/2022)   [employer] of Occupational Health - Occupational Stress Questionnaire    Feeling of Stress : Not at all  Social Connections: Moderately Integrated (08/16/2023)   Social Connection and Isolation Panel [NHANES]    Frequency of Communication with Friends and Family: More than three times a week    Frequency of Social Gatherings with Friends and Family: More than three times a week    Attends Religious Services: More than 4 times per year    Active Member of [employer] or Organizations: No    Attends Banker Meetings: Never  Marital Status: Married  Catering manager Violence: Not At Risk (08/16/2023)   Humiliation, Afraid, Rape, and Kick questionnaire    Fear of Current or Ex-Partner: No    Emotionally Abused: No    Physically Abused: No    Sexually Abused: No    FAMILY HISTORY: Family History  Problem Relation Age of Onset   Anesthesia problems Mother    Heart disease Mother        CHF, atrial fib   Heart failure Mother    Anesthesia problems Sister    Colon polyps Father    Heart disease Father        "Fluid around the heart"   Hypertension Sister    Breast cancer Maternal Aunt    Colon cancer Maternal Aunt    Prostate cancer Neg Hx    Ovarian cancer Neg Hx      PHYSICAL EXAMINATION   There were no vitals filed for this visit.   General appearance: appears restless today. No acute distress otherwise.  LABORATORY DATA:  CBC    Component Value Date/Time   WBC 5.1 09/25/2023 1414   WBC 6.3 09/17/2023 1217   RBC 3.70 (L) 09/25/2023 1414   HGB 10.9 (L) 09/25/2023 1414   HGB 12.0 06/05/2022 1024   HCT 34.1 (L) 09/25/2023 1414   HCT 37.4 06/05/2022 1024   PLT 141 (L) 09/25/2023 1414   PLT 251 06/05/2022 1024   MCV 92.2 09/25/2023 1414   MCV 86  06/05/2022 1024   MCH 29.5 09/25/2023 1414   MCHC 32.0 09/25/2023 1414   RDW 18.2 (H) 09/25/2023 1414   RDW 15.5 (H) 06/05/2022 1024   LYMPHSABS 0.3 (L) 09/25/2023 1414   LYMPHSABS 0.9 06/05/2022 1024   MONOABS 0.4 09/25/2023 1414   EOSABS 0.0 09/25/2023 1414   EOSABS 0.1 06/05/2022 1024   BASOSABS 0.0 09/25/2023 1414   BASOSABS 0.1 06/05/2022 1024    CMP     Component Value Date/Time   NA 140 09/17/2023 1217   NA 145 (H) 06/05/2022 1024   K 3.7 09/17/2023 1217   CL 102 09/17/2023 1217   CO2 30 09/17/2023 1217   GLUCOSE 126 (H) 09/17/2023 1217   BUN 10 09/17/2023 1217   BUN 17 06/05/2022 1024   CREATININE 0.66 09/17/2023 1217   CREATININE 0.45 08/28/2023 1555   CALCIUM 8.2 (L) 09/17/2023 1217   PROT 5.9 (L) 09/17/2023 1217   PROT 6.6 06/05/2022 1024   ALBUMIN 2.9 (L) 09/17/2023 1217   ALBUMIN 4.0 06/05/2022 1024   AST 28 09/17/2023 1217   AST 39 08/28/2023 1555   ALT 26 09/17/2023 1217   ALT 34 08/28/2023 1555   ALKPHOS 212 (H) 09/17/2023 1217   BILITOT 0.5 09/17/2023 1217   BILITOT 0.7 08/28/2023 1555   GFRNONAA >60 09/17/2023 1217   GFRNONAA >60 08/28/2023 1555   GFRAA 81 02/03/2020 1049   GFRAA >60 12/19/2018 1434     ASSESSMENT and THERAPY PLAN:   No problem-specific Assessment & Plan notes found for this encounter.      All questions were answered. The patient knows to call the clinic with any problems, questions or concerns. We can certainly see the patient much sooner if necessary.  Total encounter time:40 minutes*in face-to-face visit time, chart review, lab review, care coordination, order entry, and documentation of the encounter time.   *Total Encounter Time as defined by the Centers for Medicare and Medicaid Services includes, in addition to the face-to-face time of a patient visit (documented in  the note above) non-face-to-face time: obtaining and reviewing outside history, ordering and reviewing medications, tests or procedures, care  coordination (communications with other health care professionals or caregivers) and documentation in the medical record.

## 2023-10-01 NOTE — Progress Notes (Signed)
 Specialty Pharmacy Ongoing Clinical Assessment Note  Doris Smith is a 73 y.o. female who is being followed by the specialty pharmacy service for RxSp Oncology   Patient's specialty medication(s) reviewed today: Abemaciclib (VERZENIO)   Missed doses in the last 4 weeks: 0   Patient/Caregiver did not have any additional questions or concerns.   Therapeutic benefit summary: Patient is achieving benefit   Adverse events/side effects summary: No adverse events/side effects (patient was having nausea and vomiting, but it has now resolved and she reports no current side effects.)   Patient's therapy is appropriate to: Continue    Goals Addressed             This Visit's Progress    Maintain optimal adherence to therapy   On track    Patient is initiating therapy. Patient will maintain adherence         Follow up:  3 months  Servando Snare Specialty Pharmacist

## 2023-10-01 NOTE — Assessment & Plan Note (Signed)
 Doris Smith is a 73 year-old woman with metastatic breast cancer here today for follow-up and evaluation  Metastatic lobular breast cancer Metastatic lobular breast cancer with gastrointestinal involvement. Transitioned to [employer] due to [place] ineffectiveness and hematological side effects.   Pleural effusion Pleural effusion present, cytology negative for malignancy. Etiology unclear, potential causes include malignancy or cardiac issues. - Order echocardiogram to evaluate cardiac function.  Pain management Increased pain, poorly controlled, potentially exacerbating anxiety. Hesitant to adjust analgesics. - Discuss pain management with [name] to consider titration of analgesics. - Monitor pain levels and reassess as needed.  Anxiety and bipolar disorder Increased anxiety, possibly related to uncontrolled pain. Current anxiolytic regimen ineffective. Temporary increase in anxiolytic frequency suggested until therapist consultation. - Increase anxiolytic to three times daily until therapist consultation. - Discuss anxiety management with therapist during upcoming appointment.  Follow-up Requires close monitoring due to complex medical conditions and recent treatment changes. - Schedule follow-up in two weeks to assess treatment efficacy and potential titration.

## 2023-10-02 ENCOUNTER — Other Ambulatory Visit

## 2023-10-02 ENCOUNTER — Encounter: Payer: Self-pay | Admitting: Hematology and Oncology

## 2023-10-02 ENCOUNTER — Inpatient Hospital Stay: Admitting: Hematology and Oncology

## 2023-10-02 ENCOUNTER — Inpatient Hospital Stay (HOSPITAL_BASED_OUTPATIENT_CLINIC_OR_DEPARTMENT_OTHER)

## 2023-10-02 ENCOUNTER — Ambulatory Visit: Admitting: Hematology and Oncology

## 2023-10-02 VITALS — BP 134/62 | HR 60 | Temp 97.6°F | Resp 17 | Wt 155.2 lb

## 2023-10-02 DIAGNOSIS — Z170 Estrogen receptor positive status [ER+]: Secondary | ICD-10-CM | POA: Diagnosis not present

## 2023-10-02 DIAGNOSIS — C50411 Malignant neoplasm of upper-outer quadrant of right female breast: Secondary | ICD-10-CM

## 2023-10-02 DIAGNOSIS — Z79899 Other long term (current) drug therapy: Secondary | ICD-10-CM | POA: Diagnosis not present

## 2023-10-02 DIAGNOSIS — Z1732 Human epidermal growth factor receptor 2 negative status: Secondary | ICD-10-CM | POA: Diagnosis not present

## 2023-10-02 DIAGNOSIS — D649 Anemia, unspecified: Secondary | ICD-10-CM | POA: Diagnosis not present

## 2023-10-02 DIAGNOSIS — C7951 Secondary malignant neoplasm of bone: Secondary | ICD-10-CM | POA: Diagnosis not present

## 2023-10-02 DIAGNOSIS — Z5111 Encounter for antineoplastic chemotherapy: Secondary | ICD-10-CM | POA: Diagnosis not present

## 2023-10-02 DIAGNOSIS — Z1721 Progesterone receptor positive status: Secondary | ICD-10-CM | POA: Diagnosis not present

## 2023-10-02 LAB — CBC WITH DIFFERENTIAL/PLATELET
Abs Immature Granulocytes: 0.01 10*3/uL (ref 0.00–0.07)
Basophils Absolute: 0.1 10*3/uL (ref 0.0–0.1)
Basophils Relative: 1 %
Eosinophils Absolute: 0.1 10*3/uL (ref 0.0–0.5)
Eosinophils Relative: 2 %
HCT: 34.7 % — ABNORMAL LOW (ref 36.0–46.0)
Hemoglobin: 10.9 g/dL — ABNORMAL LOW (ref 12.0–15.0)
Immature Granulocytes: 0 %
Lymphocytes Relative: 9 %
Lymphs Abs: 0.4 10*3/uL — ABNORMAL LOW (ref 0.7–4.0)
MCH: 29.4 pg (ref 26.0–34.0)
MCHC: 31.4 g/dL (ref 30.0–36.0)
MCV: 93.5 fL (ref 80.0–100.0)
Monocytes Absolute: 0.3 10*3/uL (ref 0.1–1.0)
Monocytes Relative: 8 %
Neutro Abs: 3.4 10*3/uL (ref 1.7–7.7)
Neutrophils Relative %: 80 %
Platelets: 122 10*3/uL — ABNORMAL LOW (ref 150–400)
RBC: 3.71 MIL/uL — ABNORMAL LOW (ref 3.87–5.11)
RDW: 19.2 % — ABNORMAL HIGH (ref 11.5–15.5)
WBC: 4.2 10*3/uL (ref 4.0–10.5)
nRBC: 0 % (ref 0.0–0.2)

## 2023-10-04 ENCOUNTER — Telehealth: Payer: Self-pay | Admitting: Hematology and Oncology

## 2023-10-04 NOTE — Telephone Encounter (Signed)
 Spoke with patient confirming upcoming appointment

## 2023-10-12 ENCOUNTER — Ambulatory Visit (HOSPITAL_COMMUNITY)
Admission: RE | Admit: 2023-10-12 | Discharge: 2023-10-12 | Disposition: A | Discharge status: Home / Self Care | Source: Ambulatory Visit | Attending: Hematology and Oncology | Admitting: Hematology and Oncology

## 2023-10-12 DIAGNOSIS — Z0189 Encounter for other specified special examinations: Secondary | ICD-10-CM | POA: Diagnosis not present

## 2023-10-12 DIAGNOSIS — C50411 Malignant neoplasm of upper-outer quadrant of right female breast: Secondary | ICD-10-CM | POA: Diagnosis not present

## 2023-10-12 DIAGNOSIS — I10 Essential (primary) hypertension: Secondary | ICD-10-CM | POA: Insufficient documentation

## 2023-10-12 DIAGNOSIS — Z170 Estrogen receptor positive status [ER+]: Secondary | ICD-10-CM | POA: Diagnosis not present

## 2023-10-12 DIAGNOSIS — I082 Rheumatic disorders of both aortic and tricuspid valves: Secondary | ICD-10-CM | POA: Insufficient documentation

## 2023-10-12 LAB — ECHOCARDIOGRAM COMPLETE
AR max vel: 2.4 cm2
AV Area VTI: 2.22 cm2
AV Area mean vel: 2.14 cm2
AV Mean grad: 2 mmHg
AV Peak grad: 4.4 mmHg
Ao pk vel: 1.05 m/s
Area-P 1/2: 3.39 cm2
S' Lateral: 2.7 cm

## 2023-10-15 ENCOUNTER — Inpatient Hospital Stay: Payer: Medicare Other

## 2023-10-15 ENCOUNTER — Encounter

## 2023-10-15 ENCOUNTER — Inpatient Hospital Stay: Payer: Medicare Other | Admitting: Hematology and Oncology

## 2023-10-15 DIAGNOSIS — N10 Acute tubulo-interstitial nephritis: Secondary | ICD-10-CM | POA: Diagnosis not present

## 2023-10-16 ENCOUNTER — Inpatient Hospital Stay (HOSPITAL_BASED_OUTPATIENT_CLINIC_OR_DEPARTMENT_OTHER): Admitting: Nurse Practitioner

## 2023-10-16 ENCOUNTER — Encounter: Payer: Self-pay | Admitting: Nurse Practitioner

## 2023-10-16 ENCOUNTER — Encounter: Payer: Self-pay | Admitting: Adult Health

## 2023-10-16 ENCOUNTER — Other Ambulatory Visit (HOSPITAL_COMMUNITY): Payer: Self-pay

## 2023-10-16 ENCOUNTER — Inpatient Hospital Stay: Attending: Hematology and Oncology

## 2023-10-16 ENCOUNTER — Inpatient Hospital Stay

## 2023-10-16 ENCOUNTER — Inpatient Hospital Stay: Admitting: Adult Health

## 2023-10-16 VITALS — BP 114/62 | HR 84 | Temp 98.4°F | Resp 18 | Ht 64.0 in | Wt 156.9 lb

## 2023-10-16 DIAGNOSIS — Z170 Estrogen receptor positive status [ER+]: Secondary | ICD-10-CM

## 2023-10-16 DIAGNOSIS — N3289 Other specified disorders of bladder: Secondary | ICD-10-CM

## 2023-10-16 DIAGNOSIS — C50411 Malignant neoplasm of upper-outer quadrant of right female breast: Secondary | ICD-10-CM

## 2023-10-16 DIAGNOSIS — Z1721 Progesterone receptor positive status: Secondary | ICD-10-CM | POA: Diagnosis not present

## 2023-10-16 DIAGNOSIS — Z9011 Acquired absence of right breast and nipple: Secondary | ICD-10-CM | POA: Diagnosis not present

## 2023-10-16 DIAGNOSIS — C7951 Secondary malignant neoplasm of bone: Secondary | ICD-10-CM | POA: Diagnosis not present

## 2023-10-16 DIAGNOSIS — Z1732 Human epidermal growth factor receptor 2 negative status: Secondary | ICD-10-CM | POA: Insufficient documentation

## 2023-10-16 DIAGNOSIS — Z923 Personal history of irradiation: Secondary | ICD-10-CM | POA: Insufficient documentation

## 2023-10-16 DIAGNOSIS — Z5111 Encounter for antineoplastic chemotherapy: Secondary | ICD-10-CM | POA: Diagnosis not present

## 2023-10-16 DIAGNOSIS — Z9221 Personal history of antineoplastic chemotherapy: Secondary | ICD-10-CM | POA: Insufficient documentation

## 2023-10-16 DIAGNOSIS — G893 Neoplasm related pain (acute) (chronic): Secondary | ICD-10-CM | POA: Diagnosis not present

## 2023-10-16 DIAGNOSIS — Z515 Encounter for palliative care: Secondary | ICD-10-CM | POA: Diagnosis not present

## 2023-10-16 LAB — CBC WITH DIFFERENTIAL/PLATELET
Abs Immature Granulocytes: 0.02 10*3/uL (ref 0.00–0.07)
Basophils Absolute: 0 10*3/uL (ref 0.0–0.1)
Basophils Relative: 1 %
Eosinophils Absolute: 0 10*3/uL (ref 0.0–0.5)
Eosinophils Relative: 1 %
HCT: 30.6 % — ABNORMAL LOW (ref 36.0–46.0)
Hemoglobin: 9.6 g/dL — ABNORMAL LOW (ref 12.0–15.0)
Immature Granulocytes: 1 %
Lymphocytes Relative: 12 %
Lymphs Abs: 0.3 10*3/uL — ABNORMAL LOW (ref 0.7–4.0)
MCH: 30.4 pg (ref 26.0–34.0)
MCHC: 31.4 g/dL (ref 30.0–36.0)
MCV: 96.8 fL (ref 80.0–100.0)
Monocytes Absolute: 0.3 10*3/uL (ref 0.1–1.0)
Monocytes Relative: 11 %
Neutro Abs: 2.1 10*3/uL (ref 1.7–7.7)
Neutrophils Relative %: 74 %
Platelets: DECREASED 10*3/uL (ref 150–400)
RBC: 3.16 MIL/uL — ABNORMAL LOW (ref 3.87–5.11)
RDW: 21.7 % — ABNORMAL HIGH (ref 11.5–15.5)
Smear Review: DECREASED
WBC: 2.9 10*3/uL — ABNORMAL LOW (ref 4.0–10.5)
nRBC: 0 % (ref 0.0–0.2)

## 2023-10-16 LAB — CMP (CANCER CENTER ONLY)
ALT: 16 U/L (ref 0–44)
AST: 30 U/L (ref 15–41)
Albumin: 3.1 g/dL — ABNORMAL LOW (ref 3.5–5.0)
Alkaline Phosphatase: 388 U/L — ABNORMAL HIGH (ref 38–126)
Anion gap: 5 (ref 5–15)
BUN: 9 mg/dL (ref 8–23)
CO2: 30 mmol/L (ref 22–32)
Calcium: 8.7 mg/dL — ABNORMAL LOW (ref 8.9–10.3)
Chloride: 106 mmol/L (ref 98–111)
Creatinine: 0.64 mg/dL (ref 0.44–1.00)
GFR, Estimated: >60 mL/min (ref >60)
Glucose, Bld: 90 mg/dL (ref 70–99)
Potassium: 4 mmol/L (ref 3.5–5.1)
Sodium: 141 mmol/L (ref 135–145)
Total Bilirubin: 0.5 mg/dL (ref 0.0–1.2)
Total Protein: 5.6 g/dL — ABNORMAL LOW (ref 6.5–8.1)

## 2023-10-16 MED ORDER — LIDOCAINE 5 % EX PTCH: 2.0000 | MEDICATED_PATCH | CUTANEOUS | 2 refills | Status: DC

## 2023-10-16 MED ORDER — FULVESTRANT 250 MG/5ML IM SOSY
500.0000 mg | PREFILLED_SYRINGE | Freq: Once | INTRAMUSCULAR | Status: AC
  Administered 2023-10-16: 500 mg via INTRAMUSCULAR

## 2023-10-16 MED ORDER — PHENAZOPYRIDINE HCL 200 MG PO TABS: 200.0000 mg | ORAL_TABLET | Freq: Three times a day (TID) | ORAL | 0 refills | Status: DC | PRN

## 2023-10-16 MED ORDER — OXYCODONE HCL ER 15 MG PO T12A
15.0000 mg | EXTENDED_RELEASE_TABLET | Freq: Two times a day (BID) | ORAL | 0 refills | Status: DC
  Filled 2023-10-23: qty 60, 30d supply, fill #0

## 2023-10-16 MED ORDER — ZOLEDRONIC ACID 4 MG/100ML IV SOLN
4.0000 mg | Freq: Once | INTRAVENOUS | Status: AC
  Administered 2023-10-16: 4 mg via INTRAVENOUS
  Filled 2023-10-16: qty 100

## 2023-10-16 MED ORDER — SODIUM CHLORIDE 0.9 % IV SOLN: Freq: Once | INTRAVENOUS | Status: DC

## 2023-10-16 NOTE — Progress Notes (Unsigned)
 [facility] Cancer Follow up:    Doris Pierini, FNP [address] [place] [address]   DIAGNOSIS: Cancer Staging  Malignant neoplasm of upper-outer quadrant of right breast in female, estrogen receptor positive (HCC) Staging form: Breast, AJCC 8th Edition - Pathologic: Stage IB (pT3, pN10mi, cM0, G2, ER+, PR+, HER2-) - Signed by Loa Socks, NP on 10/02/2018 Multigene prognostic tests performed: MammaPrint Histologic grading system: 3 grade system - Clinical stage from 12/28/2022: Stage IV (rcT2, cN0, cM1, G3, ER+, PR+, HER2-) - Signed by Rachel Moulds, MD on 12/28/2022 Stage prefix: Recurrence Histologic grading system: 3 grade system Laterality: Right Staged by: Pathologist and managing physician Stage used in treatment planning: Yes National guidelines used in treatment planning: Yes Type of national guideline used in treatment planning: NCCN   SUMMARY OF ONCOLOGIC HISTORY: Oncology History  Malignant neoplasm of upper-outer quadrant of right breast in female, estrogen receptor positive (HCC)  08/08/2018 Initial Diagnosis   status post right breast biopsy 08/02/2018 for a clinical T3 N0, stage IIA invasive lobular carcinoma, grade 2, estrogen receptor strongly positive, progesterone receptor 1% positive, with no HER-2 amplification and an MIB-1 of 1%.             (a) CT scan of the head and chest, without contrast 08/23/2018 showed nonspecific 0.4 cm left lower lobe pulmonary nodule, no definitive metastatic disease             (b) bone scan 08/23/2018 shows multiple spinal areas of abnormal uptake, but             (c) total spinal MRI 09/03/2018 finds bone scan findings to be secondary to degenerative disease, no evidence of metastatic disease.   08/2018 - 10/2022 Anti-estrogen oral therapy   Anastrozole; discontinued 10/14/2018 in preparation for chemotherapy, resumed October 2020   09/16/2018 Surgery   Right mastectomy [name]  [phone]): Invasive Lobular Carcinoma, 10.5 cm, grade 2, negative margins. 1 of 5 lymph nodes positive for carcinoma.   09/16/2018 Miscellaneous   MammaPrint "high risk" suggests a 5-year metastasis free survival of 93% with chemotherapy, with an absolute chemotherapy benefit in the greater than 12% range    09/16/2018 Miscellaneous   Caris testing on mastectomy sample (09/16/2018) showed stable MSI and proficient mismatch repair status, with a low mutational burden; BRCA 1 and 2 were not mutated, PI K3 was not mutated, ER B B2 was not mutated, and AKT 1 was not mutated. 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 Administration: 0.25 mg (11/05/2018), 0.25 mg (11/26/2018), 0.25 mg (12/17/2018), 0.25 mg (01/07/2019), 0.25 mg (01/28/2019), 0.25 mg (02/18/2019), 0.25 mg (03/11/2019), 0.25 mg (04/02/2019)  methotrexate (PF) chemo injection 84 mg, 39.8 mg/m2 = 84.5 mg, Intravenous,  Once, 5 of 5 cycles. Administration: 84 mg (11/05/2018), 84 mg (11/26/2018), 84 mg (12/17/2018), 84 mg (03/11/2019), 84 mg (04/02/2019)  pegfilgrastim-cbqv (UDENYCA) injection 6 mg, 6 mg, Subcutaneous, Once, 6 of 6 cycles. Administration: 6 mg (12/19/2018)  cyclophosphamide (CYTOXAN) 1,260 mg in sodium chloride 0.9 % 250 mL chemo infusion, 600 mg/m2 = 1,260 mg, Intravenous,  Once, 8 of 8 cycle. Administration: 1,260 mg (11/05/2018), 1,260 mg (11/26/2018), 1,260 mg (12/17/2018), 1,260 mg (01/07/2019), 1,260 mg (01/28/2019), 1,260 mg (02/18/2019), 1,260 mg (03/11/2019), 1,260 mg (04/02/2019)  fluorouracil (ADRUCIL) chemo injection 1,250 mg,  600 mg/m2 = 1,250 mg, Intravenous,  Once, 8 of 8 cycles. Administration: 1,250 mg (11/05/2018), 1,250 mg (11/26/2018), 1,250 mg (12/17/2018), 1,250 mg (01/07/2019), 1,250 mg (01/28/2019), 1,250 mg (02/18/2019), 1,250 mg (03/11/2019), 1,250 mg  (04/02/2019).    12/31/2018 - 02/19/2019 Radiation Therapy   The patient initially received a dose of 50.4 Gy in 28 fractions to the chest wall and supraclavicular region. This was delivered using a 3-D conformal, 4 field technique. The patient then received a boost to the mastectomy scar. This delivered an additional 10 Gy in 5 fractions using an en face electron field. The total dose was 60.4 Gy.   09/12/2022 Imaging   Bone scan on 09/12/2022 shows uptake in ribs, manubrium. Widespread osseous metastasis confirmed on PET scan that was completed on October 06, 2022. Right iliac crest biopsy demonstrated metastatic carcinoma consistent with patient's known breast carcinoma. ER 90% positive, PR 90% positive, Ki67 10%, HER2 negative.    10/10/2022 Treatment Plan Change   Faslodex beginning 10/10/2022; Ibrance beginning 12/20/2022; Zometa every 12 weeks 11/07/2022    10/23/2022 - 11/03/2022 Radiation Therapy   Palliative Radiation 10/23/2022-11/03/2022:  left chest wall, lower T spine, and right proximal hip/pelvis were each treated to 30 GY in 10 fractions.      CURRENT THERAPY: Fulvestrant, Zometa, Verzenio  INTERVAL HISTORY: Doris Smith 73 y.o. female returns for    Patient Active Problem List   Diagnosis Date Noted  . Malnutrition of moderate degree 08/17/2023  . GAVE (gastric antral vascular ectasia) 08/17/2023  . Gastric erosion 08/17/2023  . Melena 08/16/2023  . Anemia associated with acute blood loss 08/16/2023  . Peripheral edema 12/29/2022  . Restless leg syndrome 10/03/2021  . Anxiety associated with depression 06/10/2020  . Port-A-Cath in place 11/12/2018  . Malignant neoplasm of upper-outer quadrant of right breast in female, estrogen receptor positive (HCC) 08/08/2018  . Right knee pain 03/01/2015  . Aortic atherosclerosis (HCC) 12/28/2014  . Obesity (BMI 30-39.9) 04/09/2014  . Insomnia due to stress 04/09/2014  . Atrial fibrillation (HCC) 10/01/2013  . GERD (gastroesophageal  reflux disease) 10/01/2013  . Bipolar disorder (HCC) 09/27/2007  . Essential hypertension 09/27/2007  . ALLERGIC RHINITIS 09/27/2007  . IRRITABLE BOWEL SYNDROME 09/27/2007  . Arthropathy 09/27/2007  . DEGENERATIVE DISC DISEASE, LUMBOSACRAL SPINE 09/27/2007    is allergic to iohexol, codeine, palonosetron, prednisone, sulfonamide derivatives, celecoxib, sulfa antibiotics, and vitamin b12.  MEDICAL HISTORY: Past Medical History:  Diagnosis Date  . A-fib (HCC) 11/2012   PAF  . Anxiety    takes Ativan  . Asthma 04/07/2011   dx  . Bipolar disorder (HCC)   . Cancer [facility])    Right breast  . Depression   . Early cataracts, bilateral   . Fatty liver   . Fibromyalgia   . GERD (gastroesophageal reflux disease)   . Irritable bowel syndrome   . Mental disorder    dx bipolar  . PONV (postoperative nausea and vomiting)   . Sleep apnea 04/2011   does not wear CPAP  . Tardive dyskinesia     SURGICAL HISTORY: Past Surgical History:  Procedure Laterality Date  . ABDOMINAL HYSTERECTOMY    . BIOPSY  08/17/2023   Procedure: BIOPSY;  Surgeon: Iva Boop, MD;  Location: [facility];  Service: Gastroenterology;;  . COLONOSCOPY    . DILATION AND CURETTAGE OF UTERUS  1981   abnormal pap  . ESOPHAGOGASTRODUODENOSCOPY (EGD) WITH PROPOFOL N/A 08/17/2023   Procedure: ESOPHAGOGASTRODUODENOSCOPY (EGD) WITH PROPOFOL;  Surgeon: Stan Head  E, MD;  Location: [facility];  Service: Gastroenterology;  Laterality: N/A;  . HOT HEMOSTASIS N/A 08/17/2023   Procedure: HOT HEMOSTASIS (ARGON PLASMA COAGULATION/BICAP);  Surgeon: Iva Boop, MD;  Location: [facility];  Service: Gastroenterology;  Laterality: N/A;  . IR THORACENTESIS ASP PLEURAL SPACE W/IMG GUIDE  09/13/2023  . KNEE ARTHROSCOPY Left 2007   x 2  . LUMBAR LAMINECTOMY/DECOMPRESSION MICRODISCECTOMY  05/31/2011   Procedure: LUMBAR LAMINECTOMY/DECOMPRESSION MICRODISCECTOMY;  Surgeon: Javier Docker;  Location: [facility];  Service: Orthopedics;   Laterality: Left;  Decompression Lumbar four to five and  Lumbar five to Sacral one on Left  (X-Ray)  . MASTECTOMY W/ SENTINEL NODE BIOPSY Right 09/16/2018   Procedure: RIGHT MASTECTOMY WITH RIGHT AXILLARY SENTINEL LYMPH NODE BIOPSY;  Surgeon: Ovidio Kin, MD;  Location[facility] OR;  Service: General;  Laterality: Right;  . PARTIAL HYSTERECTOMY  1982  . PORTACATH PLACEMENT Left 10/31/2018   Procedure: INSERTION PORT-A-CATH WITH ULTRASOUND;  Surgeon: Ovidio Kin, MD;  Location: [facility];  Service: General;  Laterality: Left;  . TONSILLECTOMY     as child  . TOTAL KNEE ARTHROPLASTY Left 12/18/2005    SOCIAL HISTORY: Social History   Socioeconomic History  . Marital status: Married    Spouse name: [name]  . Number of children: 2  . Years of education: 17  . Highest education level: Not on file  Occupational History  . Occupation: Disabled  Tobacco Use  . Smoking status: Never  . Smokeless tobacco: Never  Vaping Use  . Vaping status: Never Used  Substance and Sexual Activity  . Alcohol use: No  . Drug use: No  . Sexual activity: Yes    Birth control/protection: Post-menopausal, Surgical  Other Topics Concern  . Not on file  Social History Narrative   Tea daily.  Rarely has caffeine    Social Drivers of Corporate investment banker Strain: Low Risk  (08/18/2022)   Overall Financial Resource Strain (CARDIA)   . Difficulty of Paying Living Expenses: Not hard at all  Food Insecurity: No Food Insecurity (08/16/2023)   Hunger Vital Sign   . Worried About Programme researcher, broadcasting/film/video in the Last Year: Never true   . Ran Out of Food in the Last Year: Never true  Transportation Needs: No Transportation Needs (08/16/2023)   PRAPARE - Transportation   . Lack of Transportation (Medical): No   . Lack of Transportation (Non-Medical): No  Physical Activity: Inactive (08/18/2022)   Exercise Vital Sign   . Days of Exercise per Week: 0 days   . Minutes of Exercise per Session: 0 min   Stress: No Stress Concern Present (08/18/2022)   [employer] of Occupational Health - Occupational Stress Questionnaire   . Feeling of Stress : Not at all  Social Connections: Moderately Integrated (08/16/2023)   Social Connection and Isolation Panel [NHANES]   . Frequency of Communication with Friends and Family: More than three times a week   . Frequency of Social Gatherings with Friends and Family: More than three times a week   . Attends Religious Services: More than 4 times per year   . Active Member of Clubs or Organizations: No   . Attends Banker Meetings: Never   . Marital Status: Married  Catering manager Violence: Not At Risk (08/16/2023)   Humiliation, Afraid, Rape, and Kick questionnaire   . Fear of Current or Ex-Partner: No   . Emotionally Abused: No   . Physically Abused: No   .  Sexually Abused: No    FAMILY HISTORY: Family History  Problem Relation Age of Onset  . Anesthesia problems Mother   . Heart disease Mother        CHF, atrial fib  . Heart failure Mother   . Anesthesia problems Sister   . Colon polyps Father   . Heart disease Father        "Fluid around the heart"  . Hypertension Sister   . Breast cancer Maternal Aunt   . Colon cancer Maternal Aunt   . Prostate cancer Neg Hx   . Ovarian cancer Neg Hx     Review of Systems - Oncology    PHYSICAL EXAMINATION    Vitals:   10/16/23 1143  BP: 114/62  Pulse: 84  Resp: 18  Temp: 98.4 F (36.9 C)  SpO2: 100%    Physical Exam  LABORATORY DATA:  CBC    Component Value Date/Time   WBC 4.2 10/02/2023 1047   RBC 3.71 (L) 10/02/2023 1047   HGB 10.9 (L) 10/02/2023 1047   HGB 10.9 (L) 09/25/2023 1414   HGB 12.0 06/05/2022 1024   HCT 34.7 (L) 10/02/2023 1047   HCT 37.4 06/05/2022 1024   PLT 122 (L) 10/02/2023 1047   PLT 141 (L) 09/25/2023 1414   PLT 251 06/05/2022 1024   MCV 93.5 10/02/2023 1047   MCV 86 06/05/2022 1024   MCH 29.4 10/02/2023 1047   MCHC 31.4 10/02/2023  1047   RDW 19.2 (H) 10/02/2023 1047   RDW 15.5 (H) 06/05/2022 1024   LYMPHSABS 0.4 (L) 10/02/2023 1047   LYMPHSABS 0.9 06/05/2022 1024   MONOABS 0.3 10/02/2023 1047   EOSABS 0.1 10/02/2023 1047   EOSABS 0.1 06/05/2022 1024   BASOSABS 0.1 10/02/2023 1047   BASOSABS 0.1 06/05/2022 1024    CMP     Component Value Date/Time   NA 140 09/17/2023 1217   NA 145 (H) 06/05/2022 1024   K 3.7 09/17/2023 1217   CL 102 09/17/2023 1217   CO2 30 09/17/2023 1217   GLUCOSE 126 (H) 09/17/2023 1217   BUN 10 09/17/2023 1217   BUN 17 06/05/2022 1024   CREATININE 0.66 09/17/2023 1217   CREATININE 0.45 08/28/2023 1555   CALCIUM 8.2 (L) 09/17/2023 1217   PROT 5.9 (L) 09/17/2023 1217   PROT 6.6 06/05/2022 1024   ALBUMIN 2.9 (L) 09/17/2023 1217   ALBUMIN 4.0 06/05/2022 1024   AST 28 09/17/2023 1217   AST 39 08/28/2023 1555   ALT 26 09/17/2023 1217   ALT 34 08/28/2023 1555   ALKPHOS 212 (H) 09/17/2023 1217   BILITOT 0.5 09/17/2023 1217   BILITOT 0.7 08/28/2023 1555   GFRNONAA >60 09/17/2023 1217   GFRNONAA >60 08/28/2023 1555   GFRAA 81 02/03/2020 1049   GFRAA >60 12/19/2018 1434       PENDING LABS:   RADIOGRAPHIC STUDIES:  No results found.   PATHOLOGY:     ASSESSMENT and THERAPY PLAN:   No problem-specific Assessment & Plan notes found for this encounter.   No orders of the defined types were placed in this encounter.   All questions were answered. The patient knows to call the clinic with any problems, questions or concerns. We can certainly see the patient much sooner if necessary. This note was electronically signed. Noreene Filbert, NP 10/16/2023

## 2023-10-16 NOTE — Patient Instructions (Signed)

## 2023-10-16 NOTE — Progress Notes (Signed)
 Palliative Medicine [facility]  [phone] [phone]   Name: Doris Smith Date: 10/16/2023 MRN: [id number]  DOB: Mar 31, 1951  Patient Care Team: [name], FNP as PCP - General (Family Medicine) Jodelle Gross, NP as PCP - Cardiology (Nurse Practitioner) Ovidio Kin, MD as Consulting Physician (General Surgery) Dorothy Puffer, MD as Consulting Physician (Radiation Oncology) Lysle Pearl, MD as Consulting Physician (Pulmonary Disease) Beverely Low, MD as Consulting Physician (Orthopedic Surgery) Jene Every, MD as Consulting Physician (Orthopedic Surgery) Oneta Rack, NP as Nurse Practitioner (Adult Health Nurse Practitioner) Pershing Proud, RN as Oncology Nurse Navigator Donnelly Angelica, RN as Oncology Nurse Navigator Glenna Fellows, MD as Consulting Physician (Plastic Surgery) Rollene Rotunda, MD as Consulting Physician (Cardiology) Rachel Moulds, MD as Medical Oncologist (Hematology and Oncology) Pickenpack-Cousar, Arty Baumgartner, NP as Nurse Practitioner (Hospice and Palliative Medicine)    INTERVAL HISTORY: Doris Smith is a 73 y.o. female with oncologic medical history including estrogen receptor positive breast cancer(07/2018) s/p right mastectomy. PET scan 10/06/22 showing widespread osseous metastasis. Other pertinent history includes atrial fibrillation, asthma, OSA, GERD, fatty liver, fibromyalgia, osteoporosis, chronic headaches, anxiety, depression, and bipolar disorder. Underwent ORIF of right hip 09/20/2022. Palliative ask to see for symptom and pain management and goals of care.   SOCIAL HISTORY:     reports that she has never smoked. She has never used smokeless tobacco. She reports that she does not drink alcohol and does not use drugs.  ADVANCE DIRECTIVES:  On file  CODE STATUS: Full code  PAST MEDICAL HISTORY: Past Medical History:  Diagnosis Date   A-fib (HCC) 11/2012   PAF    Anxiety    takes Ativan   Asthma 04/07/2011   dx   Bipolar disorder (HCC)    Cancer (HCC)    Right breast   Depression    Early cataracts, bilateral    Fatty liver    Fibromyalgia    GERD (gastroesophageal reflux disease)    Irritable bowel syndrome    Mental disorder    dx bipolar   PONV (postoperative nausea and vomiting)    Sleep apnea 04/2011   does not wear CPAP   Tardive dyskinesia     ALLERGIES:  is allergic to iohexol, codeine, palonosetron, prednisone, sulfonamide derivatives, celecoxib, sulfa antibiotics, and vitamin b12.  MEDICATIONS:  Current Outpatient Medications  Medication Sig Dispense Refill   abemaciclib (VERZENIO) 50 MG tablet Take 1 tablet (50 mg total) by mouth 2 (two) times daily. (Patient not taking: Reported on 09/04/2023) 56 tablet 1   cyanocobalamin 1000 MCG tablet Take 1 tablet (1,000 mcg total) by mouth daily. 60 tablet 0   folic acid (FOLVITE) 1 MG tablet Take 1 tablet (1 mg total) by mouth daily. 60 tablet 0   Incontinence Supply Disposable (CERTAINTY FITTED BRIEFS XL) MISC 1 Package by Does not apply route as needed. 56 each 3   lidocaine (LIDODERM) 5 % Place 2 patches onto the skin daily. Remove & Discard patch within 12 hours or as directed by MD 60 patch 2   LORazepam (ATIVAN) 1 MG tablet Take 1 tablet (1 mg total) by mouth 2 (two) times daily as needed for anxiety. 60 tablet 5   OLANZapine (ZYPREXA) 2.5 MG tablet Take 1 tablet (2.5 mg total) by mouth at bedtime. For chronic nausea 30 tablet 1   ondansetron (ZOFRAN) 8 MG tablet Take 1 tablet (8 mg total) by mouth every 8 (eight) hours as needed for nausea  or vomiting. 30 tablet 5   oxyCODONE (OXYCONTIN) 15 mg 12 hr tablet Take 1 tablet (15 mg total) by mouth every 12 (twelve) hours. 60 tablet 0   oxyCODONE-acetaminophen (PERCOCET) 10-325 MG tablet Take 1 tablet by mouth every 4 (four) hours as needed for pain. 60 tablet 0   pantoprazole (PROTONIX) 40 MG tablet Take 1 tablet (40 mg total) by mouth 2  (two) times daily before a meal. 120 tablet 0   polyethylene glycol (MIRALAX / GLYCOLAX) 17 g packet Take 17 g by mouth daily.     pramipexole (MIRAPEX) 1 MG tablet Take 1 tablet (1 mg total) by mouth 3 (three) times daily. **NEEDS TO BE SEEN BEFORE NEXT REFILL** 90 tablet 0   prochlorperazine (COMPAZINE) 10 MG tablet Take 1 tablet (10 mg total) by mouth every 6 (six) hours as needed for nausea or vomiting. 30 tablet 2   No current facility-administered medications for this visit.   Facility-Administered Medications Ordered in Other Visits  Medication Dose Route Frequency Provider Last Rate Last Admin   0.9 %  sodium chloride infusion   Intravenous Once Iruku, Praveena, MD       heparin lock flush 100 unit/mL  500 Units Intravenous Once Magrinat, Valentino Hue, MD       sodium chloride flush (NS) 0.9 % injection 10 mL  10 mL Intravenous PRN Magrinat, Valentino Hue, MD        VITAL SIGNS: There were no vitals taken for this visit. There were no vitals filed for this visit.  Estimated body mass index is 26.93 kg/m as calculated from the following:   Height as of an earlier encounter on 10/16/23: 5\' 4"  (1.626 m).   Weight as of an earlier encounter on 10/16/23: 156 lb 14.4 oz (71.2 kg).   PERFORMANCE STATUS (ECOG) : 1 - Symptomatic but completely ambulatory  Physical Exam General: NAD Cardiovascular: regular rate and rhythm Pulmonary: normal breathing pattern Extremities: no edema, no joint deformities Skin: no rashes Neurological: AAO x3  IMPRESSION: Discussed the use of AI scribe software for clinical note transcription with the patient, who gave verbal consent to proceed.  History of Present Illness Doris Smith "Doris Smith" is a 73 year old female who presents with recent diagnosis urinary tract infection and medication management issues.  She is accompanied by her husband.  No acute distress.  Patient is in a wheelchair.  Smiling and appears to be in a much better state of health compared  to previous visits.  Denies concerns of nausea, vomiting, constipation, or diarrhea.  States she is appreciative of her where she is feeling sharing she has been getting up at home and moving around more without use of her walker.  She is however maintaining alertness and being cautious of fall hazards within the home.  Appetite is good. To manage nausea associated with medication, she spaces out her doses and consumes crackers to mitigate stomach upset.  States this has worked well for her.  She is experiencing symptoms consistent with a urinary tract infection, including right-sided pain that worsens with palpation, urinary urgency without complete bladder emptying, and occasional dysuria. Her cancer medication has been paused during the antibiotic treatment per husband.  Patient is taking a 10-day course of Cipro as prescribed by her PCP.  We discussed her frequent urination and feelings of pain/spasms.  Education provided on the use of Pyridium to assist with associated symptoms in the setting of UTI.  Patient is aware that medication can cause  discoloration of urine and tears.  Mrs. Ferrari reports her pain is much better controlled with current regimen.  She and spouse expresses their appreciation.  She does however express concerns that her insurance is no longer covering her lidocaine patches.  We discussed potential need for prior authorization.  We discussed her current pain regimen at length which consists of OxyContin 15 mg twice daily for pain, and oxycodone 10 mg as needed for severe/breakthrough pain. Occasionally, she takes Tylenol for pain relief.  Not requiring breakthrough medication around-the-clock.  No adjustments to current regimen.  We will continue to closely monitor and support as needed.  Goals of Care 11/30/22: Mrs. Christiana is emotional. Her husband becomes emotional as patient shares she knows her health is not the best and her cancer is not curable. She is taking life one day at time.  Speaks to when her time comes she will be ready but hopeful it is not now. She knows that no one has a definitive time frame. Expresses her appreciation in all of the care and support. Expresses her goal is to continue to treat the treatable allowing her the ability to continue to thrive while not suffering.    4/30: Since the night of 10/30/22, Mrs. Gorney has continued to meet goal of lying in her own bed at night beside her husband. She expresses appreciation for the care of everyone at the cancer center and is hopeful for continued improvement.  I discussed the importance of continued conversation with family and their medical providers regarding overall plan of care and treatment options, ensuring decisions are within the context of the patients values and GOCs.  Assessment and Plan Assessment & Plan Urinary Tract Infection (UTI) Symptoms and bladder spasms suggest UTI. Possible kidney stone evaluation deferred. - Continue 10-day antibiotic regimen as instructed by PCP. - Prescribe Pyridium as needed for bladder spasms. - Encouraged increase in water intake  Cancer related pain Management Pain controlled with Tylenol, oxycodone, OxyContin, and lidocaine patches. Prior authorization needed for patches.  Will work to obtain.  No adjustments to current regimen at this time. - Request prior authorization for lidocaine patches. - Continue OxyContin 15 mg every 12 hours.   -Continue lidocaine patches as needed. - Continue Percocet 10/325 mg 1 tablet every 4 hours as needed for severe/breakthrough pain.  Follow-up I will plan to see patient back in 4-6 weeks.  Sooner if needed.  Patient expressed understanding and was in agreement with this plan. She also understands that She can call the clinic at any time with any questions, concerns, or complaints.   Any controlled substances utilized were prescribed in the context of palliative care. PDMP has been reviewed.   Visit consisted of counseling  and education dealing with the complex and emotionally intense issues of symptom management and palliative care in the setting of serious and potentially life-threatening illness.  Willette Alma, AGPCNP-BC  Palliative Medicine Team/[facility]

## 2023-10-17 LAB — CANCER ANTIGEN 27.29: CA 27.29: 1006.1 U/mL — ABNORMAL HIGH (ref 0.0–38.6)

## 2023-10-18 ENCOUNTER — Encounter: Payer: Self-pay | Admitting: Hematology and Oncology

## 2023-10-18 ENCOUNTER — Other Ambulatory Visit: Payer: Self-pay | Admitting: Hematology and Oncology

## 2023-10-18 ENCOUNTER — Other Ambulatory Visit: Payer: Self-pay | Admitting: *Deleted

## 2023-10-18 DIAGNOSIS — J910 Malignant pleural effusion: Secondary | ICD-10-CM

## 2023-10-18 DIAGNOSIS — Z170 Estrogen receptor positive status [ER+]: Secondary | ICD-10-CM

## 2023-10-18 NOTE — Assessment & Plan Note (Signed)
 Breast cancer On Verzenio with constipation managed by Miralax. Echocardiogram normal. Prefers completing treatments during current visit. - Continue Verzenio after antibiotic completion (see below) - Continue Miralax. - Schedule follow-up echocardiogram as needed.  Acute pyelonephritis Significant abdominal pain. Started on Cipro BID and Rosadan. Improvement from antibiotics may take days. - Continue Cipro BID for 10 days. - Administer Faslodex and Zometa injections today. - Stop Verzenio and do not resume until completion of antibiotics. - Monitor symptoms and consider imaging if pain persists.  Right flank pain - Consider abdominal CT scan if pain does not improve. - Monitor symptoms and reassess if necessary.  Follow-up Follow-up care essential for management and treatment efficacy. - Schedule follow-up appointment in four weeks for next injection. - Advise to call if symptoms worsen or do not improve.

## 2023-10-22 ENCOUNTER — Ambulatory Visit: Payer: Self-pay

## 2023-10-22 NOTE — Telephone Encounter (Signed)
 Copied from CRM [id number]. Topic: Clinical - Red Word Triage >> Oct 22, 2023 10:03 AM Tiffany B wrote: Red Word that prompted transfer to Nurse Triage: Patient experiencing dizziness no other sx and seeking appointment.  Chief Complaint: Dizziness Symptoms: Lightheadedness, off balance, headache, tinnitus Frequency: A few days Pertinent Negatives: Patient denies fever Disposition: [] ED /[] Urgent Care (no appt availability in office) / [x] Appointment(In office/virtual)/ []  [facility]/ [] Home Care/ [] Refused Recommended Disposition /[] [facility]/ []  Follow-up with PCP Additional Notes: Patient called in to report moderate dizziness. Patient stated dizziness has been present for a few days. Patient described dizziness as "feeling faint" and as if the room is spinning. Patient stated she has had vertigo in the past, but could not recall when. Patient denied fainting and falling at this time. Patient stated she chooses to walk close to the wall to keep her balance. Patient described tinnitus and chronic headache. Patient able to speak in clear and complete sentences while on phone with this RN. No work of breathing detected. Patient denied the following symptoms: chest pain, fever, facial drooping and one-sided weakness. Advised patient to see provider within 24 hours, per protocol. No availability with PCP. Scheduled patient with DOD tomorrow. Provided care advice and instructed patient to call back if symptoms worsen. Patient complied.   Reason for Disposition  [1] MODERATE dizziness (e.g., interferes with normal activities) AND [2] has NOT been evaluated by doctor (or NP/PA) for this  (Exception: Dizziness caused by heat exposure, sudden standing, or poor fluid intake.)  Answer Assessment - Initial Assessment Questions 1. DESCRIPTION: "Describe your dizziness."     States she feels "faint" and like the room is spinning 2. LIGHTHEADED: "Do you feel lightheaded?" (e.g., somewhat  faint, woozy, weak upon standing)     States she feels "faint", denies fainting and falling at this time 3. VERTIGO: "Do you feel like either you or the room is spinning or tilting?" (i.e. vertigo)     Yes, states she has had vertigo "in the past", unable to recall most recent episode of vertigo  4. SEVERITY: "How bad is it?"  "Do you feel like you are going to faint?" "Can you stand and walk?"   - MILD: Feels slightly dizzy, but walking normally.   - MODERATE: Feels unsteady when walking, but not falling; interferes with normal activities (e.g., school, work).   - SEVERE: Unable to walk without falling, or requires assistance to walk without falling; feels like passing out now.      States sometimes when she walks she's "fine" and sometimes she's "dizzy", has to reach out to objects for balance at times, states she stays along the wall for balance  5. ONSET:  "When did the dizziness begin?"     A few days  6. AGGRAVATING FACTORS: "Does anything make it worse?" (e.g., standing, change in head position)     Denies factors that make it worse 7. HEART RATE: "Can you tell me your heart rate?" "How many beats in 15 seconds?"  (Note: not all patients can do this)       N/A 8. CAUSE: "What do you think is causing the dizziness?"     Unknown  9. RECURRENT SYMPTOM: "Have you had dizziness before?" If Yes, ask: "When was the last time?" "What happened that time?"     Patient cannot recall 10. OTHER SYMPTOMS: "Do you have any other symptoms?" (e.g., fever, chest pain, vomiting, diarrhea, bleeding)     Chronic headache, states  headache affects vision, states she has been experiencing SOB for the past few weeks, denies fever, denies chest pain, states she has been experiencing changes in speech due to numerous medications, denies facial drooping, denies one sided weakness, tinnitus  Protocols used: Dizziness - Lightheadedness-A-AH

## 2023-10-23 ENCOUNTER — Ambulatory Visit (INDEPENDENT_AMBULATORY_CARE_PROVIDER_SITE_OTHER): Admitting: Nurse Practitioner

## 2023-10-23 ENCOUNTER — Other Ambulatory Visit (HOSPITAL_COMMUNITY): Payer: Self-pay

## 2023-10-23 ENCOUNTER — Other Ambulatory Visit: Payer: Self-pay

## 2023-10-23 ENCOUNTER — Encounter: Payer: Self-pay | Admitting: Nurse Practitioner

## 2023-10-23 VITALS — BP 108/60 | HR 67 | Temp 98.2°F | Ht 64.0 in | Wt 156.0 lb

## 2023-10-23 DIAGNOSIS — R42 Dizziness and giddiness: Secondary | ICD-10-CM

## 2023-10-23 NOTE — Progress Notes (Signed)
 Acute Office Visit  Subjective:     Patient ID: Doris Smith, female    DOB: Dec 28, 1950, 73 y.o.   MRN: [id number]  Chief Complaint  Patient presents with   Dizziness    Dizziness for a while but has gotten worse in the last week     HPI Doris Smith is a 73 year old female with a history of bone cancer who presents on 10/23/2023 with complaints of dizziness. She reports that the dizziness has been persistent but does not describe it as severe or debilitating. She mentions feeling lightheaded at times but denies a complete loss of balance or syncope. She has a poor appetite and notes that foods "don't taste right." She also experiences dry hives after eating. She is currently being treated for a urinary tract infection (UTI) with ciprofloxacin, taking it twice daily (BID). She denies any other associated symptoms such as chest pain, shortness of breath, or fever. Active Ambulatory Problems    Diagnosis Date Noted   Bipolar disorder (HCC) 09/27/2007   Essential hypertension 09/27/2007   ALLERGIC RHINITIS 09/27/2007   IRRITABLE BOWEL SYNDROME 09/27/2007   Arthropathy 09/27/2007   DEGENERATIVE DISC DISEASE, LUMBOSACRAL SPINE 09/27/2007   Atrial fibrillation (HCC) 10/01/2013   GERD (gastroesophageal reflux disease) 10/01/2013   Obesity (BMI 30-39.9) 04/09/2014   Insomnia due to stress 04/09/2014   Aortic atherosclerosis (HCC) 12/28/2014   Right knee pain 03/01/2015   Malignant neoplasm of upper-outer quadrant of right breast in female, estrogen receptor positive (HCC) 08/08/2018   Port-A-Cath in place 11/12/2018   Anxiety associated with depression 06/10/2020   Restless leg syndrome 10/03/2021   Peripheral edema 12/29/2022   Melena 08/16/2023   Anemia associated with acute blood loss 08/16/2023   Malnutrition of moderate degree 08/17/2023   GAVE (gastric antral vascular ectasia) 08/17/2023   Gastric erosion 08/17/2023   Resolved Ambulatory Problems    Diagnosis Date  Noted   NAUSEA AND VOMITING 06/17/2008   Abdominal pain 10/03/2012   Malignant neoplasm of upper-inner quadrant of right female breast (HCC) 08/08/2018   Breast cancer, stage 2, right (HCC) 09/16/2018   Palpitations 07/15/2019   Educated about COVID-19 virus infection 07/15/2019   Fall 09/04/2019   Dizziness 03/24/2020   Unresolved grief 06/10/2020   Rash 06/10/2020   Dark stools 08/18/2020   Past Medical History:  Diagnosis Date   A-fib (HCC) 11/2012   Anxiety    Asthma 04/07/2011   Cancer (HCC)    Depression    Early cataracts, bilateral    Fatty liver    Fibromyalgia    Mental disorder    PONV (postoperative nausea and vomiting)    Sleep apnea 04/2011   Tardive dyskinesia     Review of Systems  Constitutional:  Negative for chills and fever.  Respiratory:  Negative for cough.   Cardiovascular:  Negative for chest pain and leg swelling.  Gastrointestinal:  Positive for nausea. Negative for constipation and diarrhea.  Skin:  Negative for itching and rash.  Neurological:  Positive for dizziness. Negative for headaches.       Current getting treated for cancer   Negative unless indicated in HPI    Objective:    BP 108/60   Pulse 67   Temp 98.2 F (36.8 C) (Temporal)   Ht 5\' 4"  (1.626 m)   Wt 156 lb (70.8 kg)   SpO2 97%   BMI 26.78 kg/m  BP Readings from Last 3 Encounters:  10/23/23 108/60  10/16/23 114/62  10/02/23  134/62   Wt Readings from Last 3 Encounters:  10/23/23 156 lb (70.8 kg)  10/16/23 156 lb 14.4 oz (71.2 kg)  10/02/23 155 lb 3.2 oz (70.4 kg)      Physical Exam Vitals and nursing note reviewed.  HENT:     Head: Normocephalic and atraumatic.     Nose: Nose normal.  Eyes:     Pupils: Pupils are equal, round, and reactive to light.  Cardiovascular:     Heart sounds: Normal heart sounds.  Pulmonary:     Effort: Pulmonary effort is normal.     Breath sounds: Normal breath sounds.  Musculoskeletal:        General: Normal range of motion.      Right lower leg: No edema.     Left lower leg: No edema.  Skin:    General: Skin is warm and dry.     Findings: No rash.  Neurological:     Mental Status: She is alert and oriented to person, place, and time.  Psychiatric:        Mood and Affect: Mood normal.        Behavior: Behavior normal.        Thought Content: Thought content normal.        Judgment: Judgment normal.     No results found for any visits on 10/23/23.      Assessment & Plan:  Dizziness   Doris Smith is a 73 year old Caucasian female seen today for dizziness, no acute distress Advised client to continue taking Cipro twice daily that was prescribed for UTI last week, increase hydration. Eat small meals, and utilize already prescribed Zofran for nausea  Encourage healthy lifestyle choices, including diet (rich in fruits, vegetables, and lean proteins, and low in salt and simple carbohydrates) and exercise (at least 30 minutes of moderate physical activity daily).     The above assessment and management plan was discussed with the patient. The patient verbalized understanding of and has agreed to the management plan. Patient is aware to call the clinic if they develop any new symptoms or if symptoms persist or worsen. Patient is aware when to return to the clinic for a follow-up visit. Patient educated on when it is appropriate to go to the emergency department.  Return if symptoms worsen or fail to improve.  Gregg Winchell [place] Thompson, DNP [facility] [address] [place], [place] [address] [phone]  Note: This document was prepared by [employer] voice dictation technology and any errors that results from this process are unintentional.

## 2023-10-25 ENCOUNTER — Other Ambulatory Visit: Payer: Self-pay

## 2023-11-07 ENCOUNTER — Other Ambulatory Visit: Payer: Self-pay

## 2023-11-07 ENCOUNTER — Other Ambulatory Visit: Payer: Self-pay | Admitting: Hematology and Oncology

## 2023-11-07 MED ORDER — ABEMACICLIB 50 MG PO TABS
50.0000 mg | ORAL_TABLET | Freq: Two times a day (BID) | ORAL | 1 refills | Status: DC
  Filled 2023-11-07: qty 56, 28d supply, fill #0
  Filled 2023-12-04: qty 56, 28d supply, fill #1

## 2023-11-07 NOTE — Progress Notes (Signed)
 Specialty Pharmacy Refill Coordination Note  Doris Smith is a 73 y.o. female contacted today regarding refills of specialty medication(s) Abemaciclib  (VERZENIO )   Patient requested Delivery   Delivery date: 11/12/23   Verified address: [address] [place], [place] [address]   Medication will be filled on 11/09/23.   This fill date is pending response to refill request from provider. Patient is aware and if they have not received fill by intended date they must follow up with pharmacy.

## 2023-11-14 ENCOUNTER — Inpatient Hospital Stay: Admitting: Nurse Practitioner

## 2023-11-14 ENCOUNTER — Inpatient Hospital Stay (HOSPITAL_BASED_OUTPATIENT_CLINIC_OR_DEPARTMENT_OTHER): Attending: Hematology and Oncology

## 2023-11-14 ENCOUNTER — Ambulatory Visit (HOSPITAL_COMMUNITY)
Admission: RE | Admit: 2023-11-14 | Discharge: 2023-11-14 | Disposition: A | Discharge status: Home / Self Care | Source: Ambulatory Visit | Attending: Hematology and Oncology | Admitting: Hematology and Oncology

## 2023-11-14 ENCOUNTER — Inpatient Hospital Stay: Attending: Hematology and Oncology | Admitting: Hematology and Oncology

## 2023-11-14 ENCOUNTER — Inpatient Hospital Stay

## 2023-11-14 ENCOUNTER — Other Ambulatory Visit: Payer: Self-pay | Admitting: *Deleted

## 2023-11-14 VITALS — BP 110/64 | HR 78 | Temp 98.1°F | Resp 16 | Wt 155.6 lb

## 2023-11-14 DIAGNOSIS — J910 Malignant pleural effusion: Secondary | ICD-10-CM

## 2023-11-14 DIAGNOSIS — Z170 Estrogen receptor positive status [ER+]: Secondary | ICD-10-CM | POA: Diagnosis not present

## 2023-11-14 DIAGNOSIS — C7951 Secondary malignant neoplasm of bone: Secondary | ICD-10-CM | POA: Insufficient documentation

## 2023-11-14 DIAGNOSIS — Z1721 Progesterone receptor positive status: Secondary | ICD-10-CM | POA: Insufficient documentation

## 2023-11-14 DIAGNOSIS — J90 Pleural effusion, not elsewhere classified: Secondary | ICD-10-CM | POA: Diagnosis not present

## 2023-11-14 DIAGNOSIS — C50411 Malignant neoplasm of upper-outer quadrant of right female breast: Secondary | ICD-10-CM

## 2023-11-14 DIAGNOSIS — Z5111 Encounter for antineoplastic chemotherapy: Secondary | ICD-10-CM | POA: Diagnosis not present

## 2023-11-14 DIAGNOSIS — J9811 Atelectasis: Secondary | ICD-10-CM | POA: Diagnosis not present

## 2023-11-14 DIAGNOSIS — Z1732 Human epidermal growth factor receptor 2 negative status: Secondary | ICD-10-CM | POA: Diagnosis not present

## 2023-11-14 DIAGNOSIS — C50919 Malignant neoplasm of unspecified site of unspecified female breast: Secondary | ICD-10-CM | POA: Diagnosis not present

## 2023-11-14 LAB — CBC WITH DIFFERENTIAL (CANCER CENTER ONLY)
Abs Immature Granulocytes: 0.02 10*3/uL (ref 0.00–0.07)
Basophils Absolute: 0 10*3/uL (ref 0.0–0.1)
Basophils Relative: 1 %
Eosinophils Absolute: 0.1 10*3/uL (ref 0.0–0.5)
Eosinophils Relative: 3 %
HCT: 26.8 % — ABNORMAL LOW (ref 36.0–46.0)
Hemoglobin: 8.6 g/dL — ABNORMAL LOW (ref 12.0–15.0)
Immature Granulocytes: 1 %
Lymphocytes Relative: 12 %
Lymphs Abs: 0.3 10*3/uL — ABNORMAL LOW (ref 0.7–4.0)
MCH: 32.7 pg (ref 26.0–34.0)
MCHC: 32.1 g/dL (ref 30.0–36.0)
MCV: 101.9 fL — ABNORMAL HIGH (ref 80.0–100.0)
Monocytes Absolute: 0.3 10*3/uL (ref 0.1–1.0)
Monocytes Relative: 10 %
Neutro Abs: 1.9 10*3/uL (ref 1.7–7.7)
Neutrophils Relative %: 73 %
Platelet Count: 149 10*3/uL — ABNORMAL LOW (ref 150–400)
RBC: 2.63 MIL/uL — ABNORMAL LOW (ref 3.87–5.11)
RDW: 21.3 % — ABNORMAL HIGH (ref 11.5–15.5)
WBC Count: 2.6 10*3/uL — ABNORMAL LOW (ref 4.0–10.5)
nRBC: 0 % (ref 0.0–0.2)

## 2023-11-14 LAB — CMP (CANCER CENTER ONLY)
ALT: 18 U/L (ref 0–44)
AST: 38 U/L (ref 15–41)
Albumin: 3.4 g/dL — ABNORMAL LOW (ref 3.5–5.0)
Alkaline Phosphatase: 308 U/L — ABNORMAL HIGH (ref 38–126)
Anion gap: 8 (ref 5–15)
BUN: 12 mg/dL (ref 8–23)
CO2: 27 mmol/L (ref 22–32)
Calcium: 9 mg/dL (ref 8.9–10.3)
Chloride: 105 mmol/L (ref 98–111)
Creatinine: 0.71 mg/dL (ref 0.44–1.00)
GFR, Estimated: >60 mL/min (ref >60)
Glucose, Bld: 112 mg/dL — ABNORMAL HIGH (ref 70–99)
Potassium: 3.9 mmol/L (ref 3.5–5.1)
Sodium: 140 mmol/L (ref 135–145)
Total Bilirubin: 0.5 mg/dL (ref 0.0–1.2)
Total Protein: 6 g/dL — ABNORMAL LOW (ref 6.5–8.1)

## 2023-11-14 MED ORDER — FULVESTRANT 250 MG/5ML IM SOSY
500.0000 mg | PREFILLED_SYRINGE | Freq: Once | INTRAMUSCULAR | Status: AC
  Administered 2023-11-14: 500 mg via INTRAMUSCULAR
  Filled 2023-11-14: qty 10

## 2023-11-14 NOTE — Assessment & Plan Note (Signed)
 Assessment and Plan Assessment & Plan Breast cancer with metastasis Improvement in physical activity noted. Dyspnea and nausea present. Leukopenia due to Verzenio . Hemoglobin at 8.6, platelets at 149,000. Discussed port placement for venous access and transfusions. - Continue Verzenio  and Faslodex . - Order scan at the end of May to assess tumor response. - Arrange for port placement in the next couple of weeks,pt preference, she is a hard access.  Dyspnea Dyspnea at rest and with activity. Concern for fluid accumulation related to cancer or treatment. - Order chest x-ray today to assess for fluid accumulation. - Consider ultrasound and drainage if x-ray indicates fluid accumulation.  Anemia in neoplastic disease Hemoglobin at 8.6. Fatigue present but manageable. No transfusion required at this time.  Nausea due to cancer treatment Nausea associated with Verzenio . Improvement with Zyprexa  at night. Nausea tolerable and managed. - Continue current medication regimen including Zyprexa  at night.  Edema Mild swelling in feet. No immediate intervention required.

## 2023-11-14 NOTE — Progress Notes (Signed)
 [facility] Cancer Follow up:    Doris Feller, FNP [address] [place] [address]   DIAGNOSIS:  Cancer Staging  Malignant neoplasm of upper-outer quadrant of right breast in female, estrogen receptor positive (HCC) Staging form: Breast, AJCC 8th Edition - Pathologic: Stage IB (pT3, pN82mi, cM0, G2, ER+, PR+, HER2-) - Signed by Percival Brace, NP on 10/02/2018 Multigene prognostic tests performed: MammaPrint Histologic grading system: 3 grade system - Clinical stage from 12/28/2022: Stage IV (rcT2, cN0, cM1, G3, ER+, PR+, HER2-) - Signed by Murleen Arms, MD on 12/28/2022 Stage prefix: Recurrence Histologic grading system: 3 grade system Laterality: Right Staged by: Pathologist and managing physician Stage used in treatment planning: Yes National guidelines used in treatment planning: Yes Type of national guideline used in treatment planning: NCCN   SUMMARY OF ONCOLOGIC HISTORY: Oncology History  Malignant neoplasm of upper-outer quadrant of right breast in female, estrogen receptor positive (HCC)  08/08/2018 Initial Diagnosis   status post right breast biopsy 08/02/2018 for a clinical T3 N0, stage IIA invasive lobular carcinoma, grade 2, estrogen receptor strongly positive, progesterone receptor 1% positive, with no HER-2 amplification and an MIB-1 of 1%.             (a) CT scan of the head and chest, without contrast 08/23/2018 showed nonspecific 0.4 cm left lower lobe pulmonary nodule, no definitive metastatic disease             (b) bone scan 08/23/2018 shows multiple spinal areas of abnormal uptake, but             (c) total spinal MRI 09/03/2018 finds bone scan findings to be secondary to degenerative disease, no evidence of metastatic disease.   08/2018 - 10/2022 Anti-estrogen oral therapy   Anastrozole ; discontinued 10/14/2018 in preparation for chemotherapy, resumed October 2020   09/16/2018 Surgery   Right mastectomy Doris Smith)  [phone]): Invasive Lobular Carcinoma, 10.5 cm, grade 2, negative margins. 1 of 5 lymph nodes positive for carcinoma.   09/16/2018 Miscellaneous   MammaPrint "high risk" suggests a 5-year metastasis free survival of 93% with chemotherapy, with an absolute chemotherapy benefit in the greater than 12% range    09/16/2018 Miscellaneous   Caris testing on mastectomy sample (09/16/2018) showed stable MSI and proficient mismatch repair status, with a low mutational burden; BRCA 1 and 2 were not mutated, PI K3 was not mutated, ER B B2 was not mutated, and AKT 1 was not mutated. 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 Administration: 0.25 mg (11/05/2018), 0.25 mg (11/26/2018), 0.25 mg (12/17/2018), 0.25 mg (01/07/2019), 0.25 mg (01/28/2019), 0.25 mg (02/18/2019), 0.25 mg (03/11/2019), 0.25 mg (04/02/2019)  methotrexate  (PF) chemo injection 84 mg, 39.8 mg/m2 = 84.5 mg, Intravenous,  Once, 5 of 5 cycles. Administration: 84 mg (11/05/2018), 84 mg (11/26/2018), 84 mg (12/17/2018), 84 mg (03/11/2019), 84 mg (04/02/2019)  pegfilgrastim -cbqv (UDENYCA ) injection 6 mg, 6 mg, Subcutaneous, Once, 6 of 6 cycles. Administration: 6 mg (12/19/2018)  cyclophosphamide  (CYTOXAN ) 1,260 mg in sodium chloride  0.9 % 250 mL chemo infusion, 600 mg/m2 = 1,260 mg, Intravenous,  Once, 8 of 8 cycle. Administration: 1,260 mg (11/05/2018), 1,260 mg (11/26/2018), 1,260 mg (12/17/2018), 1,260 mg (01/07/2019), 1,260 mg (01/28/2019), 1,260 mg (02/18/2019), 1,260 mg (03/11/2019), 1,260 mg (04/02/2019)  fluorouracil  (ADRUCIL ) chemo injection 1,250  mg, 600 mg/m2 = 1,250 mg, Intravenous,  Once, 8 of 8 cycles. Administration: 1,250 mg (11/05/2018), 1,250 mg (11/26/2018), 1,250 mg (12/17/2018), 1,250 mg (01/07/2019), 1,250 mg (01/28/2019), 1,250 mg (02/18/2019), 1,250 mg (03/11/2019), 1,250 mg  (04/02/2019).    12/31/2018 - 02/19/2019 Radiation Therapy   The patient initially received a dose of 50.4 Gy in 28 fractions to the chest wall and supraclavicular region. This was delivered using a 3-D conformal, 4 field technique. The patient then received a boost to the mastectomy scar. This delivered an additional 10 Gy in 5 fractions using an en face electron field. The total dose was 60.4 Gy.   09/12/2022 Imaging   Bone scan on 09/12/2022 shows uptake in ribs, manubrium. Widespread osseous metastasis confirmed on PET scan that was completed on October 06, 2022. Right iliac crest biopsy demonstrated metastatic carcinoma consistent with patient's known breast carcinoma. ER 90% positive, PR 90% positive, Ki67 10%, HER2 negative.    10/10/2022 Treatment Plan Change   Faslodex  beginning 10/10/2022; Ibrance  beginning 12/20/2022; Zometa  every 12 weeks 11/07/2022    10/23/2022 - 11/03/2022 Radiation Therapy   Palliative Radiation 10/23/2022-11/03/2022:  left chest wall, lower T spine, and right proximal hip/pelvis were each treated to 30 GY in 10 fractions.      CURRENT THERAPY: Fulvestrant , Zometa , Verzenio   INTERVAL HISTORY:  Discussed the use of AI scribe software for clinical note transcription with the patient, who gave verbal consent to proceed.  History of Present Illness Doris Smith "Doris Smith" is a 73 year old female with breast cancer who presents with difficulty breathing and nausea. She is now on verzenio , faslodex  and Zometa . Overall she is doing quite well.  She experiences difficulty breathing both while walking and sitting. The severity varies, with some days being worse than others, but she has also had days without pain. Recently, she started climbing eight steps up and down, which she considers a significant improvement.  She has been experiencing nausea and vomiting, having vomited twice in the past week. Her appetite has decreased, and she is eating smaller portions to avoid feeling  sick. She associates the nausea with restarting Verzenio  after a break due to antibiotic use. No diarrhea, but she reports regular bowel movements.  Her hemoglobin level is 8.6, which is lower than previous levels. She has been feeling tired and attributes this to her medication and efforts to increase physical activity. She has been walking to a halfway marker and climbing steps, which she started a week ago. Her feet have been swelling slightly.   Patient Active Problem List   Diagnosis Date Noted   Malnutrition of moderate degree 08/17/2023   GAVE (gastric antral vascular ectasia) 08/17/2023   Gastric erosion 08/17/2023   Melena 08/16/2023   Anemia associated with acute blood loss 08/16/2023   Peripheral edema 12/29/2022   Restless leg syndrome 10/03/2021   Anxiety associated with depression 06/10/2020   Port-A-Cath in place 11/12/2018   Malignant neoplasm of upper-outer quadrant of right breast in female, estrogen receptor positive (HCC) 08/08/2018   Right knee pain 03/01/2015   Aortic atherosclerosis (HCC) 12/28/2014   Obesity (BMI 30-39.9) 04/09/2014   Insomnia due to stress 04/09/2014   Atrial fibrillation (HCC) 10/01/2013   GERD (gastroesophageal reflux disease) 10/01/2013   Bipolar disorder (HCC) 09/27/2007   Essential hypertension 09/27/2007   ALLERGIC RHINITIS 09/27/2007   IRRITABLE BOWEL SYNDROME 09/27/2007   Arthropathy 09/27/2007   DEGENERATIVE DISC DISEASE, LUMBOSACRAL SPINE 09/27/2007    is allergic to iohexol, codeine, palonosetron ,  prednisone , sulfonamide derivatives, celecoxib, sulfa antibiotics, and vitamin b12.  MEDICAL HISTORY: Past Medical History:  Diagnosis Date   A-fib (HCC) 11/2012   PAF   Anxiety    takes Ativan    Asthma 04/07/2011   dx   Bipolar disorder (HCC)    Cancer (HCC)    Right breast   Depression    Early cataracts, bilateral    Fatty liver    Fibromyalgia    GERD (gastroesophageal reflux disease)    Irritable bowel syndrome     Mental disorder    dx bipolar   PONV (postoperative nausea and vomiting)    Sleep apnea 04/2011   does not wear CPAP   Tardive dyskinesia     SURGICAL HISTORY: Past Surgical History:  Procedure Laterality Date   ABDOMINAL HYSTERECTOMY     BIOPSY  08/17/2023   Procedure: BIOPSY;  Surgeon: Kenney Peacemaker, MD;  Location: [facility];  Service: Gastroenterology;;   COLONOSCOPY     DILATION AND CURETTAGE OF UTERUS  1981   abnormal pap   ESOPHAGOGASTRODUODENOSCOPY (EGD) WITH PROPOFOL  N/A 08/17/2023   Procedure: ESOPHAGOGASTRODUODENOSCOPY (EGD) WITH PROPOFOL ;  Surgeon: Kenney Peacemaker, MD;  Location: [facility];  Service: Gastroenterology;  Laterality: N/A;   HOT HEMOSTASIS N/A 08/17/2023   Procedure: HOT HEMOSTASIS (ARGON PLASMA COAGULATION/BICAP);  Surgeon: Kenney Peacemaker, MD;  Location: [facility];  Service: Gastroenterology;  Laterality: N/A;   IR THORACENTESIS ASP PLEURAL SPACE W/IMG GUIDE  09/13/2023   KNEE ARTHROSCOPY Left 2007   x 2   LUMBAR LAMINECTOMY/DECOMPRESSION MICRODISCECTOMY  05/31/2011   Procedure: LUMBAR LAMINECTOMY/DECOMPRESSION MICRODISCECTOMY;  Surgeon: Loel Ring;  Location: [facility];  Service: Orthopedics;  Laterality: Left;  Decompression Lumbar four to five and  Lumbar five to Sacral one on Left  (X-Ray)   MASTECTOMY W/ SENTINEL NODE BIOPSY Right 09/16/2018   Procedure: RIGHT MASTECTOMY WITH RIGHT AXILLARY SENTINEL LYMPH NODE BIOPSY;  Surgeon: Juanita Norlander, MD;  Location: [facility] OR;  Service: General;  Laterality: Right;   PARTIAL HYSTERECTOMY  1982   PORTACATH PLACEMENT Left 10/31/2018   Procedure: INSERTION PORT-A-CATH WITH ULTRASOUND;  Surgeon: Juanita Norlander, MD;  Location: [facility];  Service: General;  Laterality: Left;   TONSILLECTOMY     as child   TOTAL KNEE ARTHROPLASTY Left 12/18/2005    SOCIAL HISTORY: Social History   Socioeconomic History   Marital status: Married    Spouse name: Royston Cornea   Number of children: 2   Years of education:  12   Highest education level: Not on file  Occupational History   Occupation: Disabled  Tobacco Use   Smoking status: Never   Smokeless tobacco: Never  Vaping Use   Vaping status: Never Used  Substance and Sexual Activity   Alcohol use: No   Drug use: No   Sexual activity: Yes    Birth control/protection: Post-menopausal, Surgical  Other Topics Concern   Not on file  Social History Narrative   Tea daily.  Rarely has caffeine    Social Drivers of Corporate investment banker Strain: Patient Declined (10/22/2023)   Overall Financial Resource Strain (CARDIA)    Difficulty of Paying Living Expenses: Patient declined  Food Insecurity: Patient Declined (10/22/2023)   Hunger Vital Sign    Worried About Running Out of Food in the Last Year: Patient declined    Ran Out of Food in the Last Year: Patient declined  Transportation Needs: No Transportation Needs (10/22/2023)   PRAPARE - Transportation  Lack of Transportation (Medical): No    Lack of Transportation (Non-Medical): No  Physical Activity: Unknown (10/22/2023)   Exercise Vital Sign    Days of Exercise per Week: 0 days    Minutes of Exercise per Session: Not on file  Stress: Stress Concern Present (10/22/2023)   [employer] of Occupational Health - Occupational Stress Questionnaire    Feeling of Stress : To some extent  Social Connections: Unknown (10/22/2023)   Social Connection and Isolation Panel [NHANES]    Frequency of Communication with Friends and Family: More than three times a week    Frequency of Social Gatherings with Friends and Family: Twice a week    Attends Religious Services: Patient declined    Database administrator or Organizations: No    Attends Banker Meetings: Never    Marital Status: Married  Catering manager Violence: Not At Risk (08/16/2023)   Humiliation, Afraid, Rape, and Kick questionnaire    Fear of Current or Ex-Partner: No    Emotionally Abused: No    Physically Abused: No     Sexually Abused: No    FAMILY HISTORY: Family History  Problem Relation Age of Onset   Anesthesia problems Mother    Heart disease Mother        CHF, atrial fib   Heart failure Mother    Anesthesia problems Sister    Colon polyps Father    Heart disease Father        "Fluid around the heart"   Hypertension Sister    Breast cancer Maternal Aunt    Colon cancer Maternal Aunt    Prostate cancer Neg Hx    Ovarian cancer Neg Hx     Review of Systems  Constitutional:  Negative for appetite change, chills, fatigue, fever and unexpected weight change.  HENT:   Negative for hearing loss, lump/mass and trouble swallowing.   Eyes:  Negative for eye problems and icterus.  Respiratory:  Negative for chest tightness, cough and shortness of breath.   Cardiovascular:  Negative for chest pain, leg swelling and palpitations.  Gastrointestinal:  Negative for abdominal distention, abdominal pain, constipation, diarrhea, nausea and vomiting.  Endocrine: Negative for hot flashes.  Genitourinary:  Negative for difficulty urinating.   Musculoskeletal:  Negative for arthralgias.  Skin:  Negative for itching and rash.  Neurological:  Negative for dizziness, extremity weakness, headaches and numbness.  Hematological:  Negative for adenopathy. Does not bruise/bleed easily.  Psychiatric/Behavioral:  Negative for depression. The patient is not nervous/anxious.       PHYSICAL EXAMINATION    Vitals:   11/14/23 1329  BP: 110/64  Pulse: 78  Resp: 16  Temp: 98.1 F (36.7 C)  SpO2: 97%    Physical Exam Constitutional:      General: She is not in acute distress.    Appearance: Normal appearance. She is not toxic-appearing.  HENT:     Head: Normocephalic and atraumatic.     Mouth/Throat:     Mouth: Mucous membranes are moist.     Pharynx: Oropharynx is clear. No oropharyngeal exudate or posterior oropharyngeal erythema.  Eyes:     General: No scleral icterus. Cardiovascular:     Rate and  Rhythm: Normal rate and regular rhythm.     Pulses: Normal pulses.     Heart sounds: Normal heart sounds.  Pulmonary:     Effort: Pulmonary effort is normal.     Comments: Diminished air entry bilateral bases. Left greater than right base. Abdominal:  General: Abdomen is flat. Bowel sounds are normal. There is no distension.     Palpations: Abdomen is soft.     Tenderness: There is no abdominal tenderness.  Musculoskeletal:        General: No swelling.     Cervical back: Neck supple.  Lymphadenopathy:     Cervical: No cervical adenopathy.  Skin:    General: Skin is warm and dry.     Findings: No rash.  Neurological:     General: No focal deficit present.     Mental Status: She is alert.  Psychiatric:        Mood and Affect: Mood normal.        Behavior: Behavior normal.     LABORATORY DATA:  CBC    Component Value Date/Time   WBC 2.6 (L) 11/14/2023 1257   WBC 2.9 (L) 10/16/2023 1103   RBC 2.63 (L) 11/14/2023 1257   HGB 8.6 (L) 11/14/2023 1257   HGB 12.0 06/05/2022 1024   HCT 26.8 (L) 11/14/2023 1257   HCT 37.4 06/05/2022 1024   PLT 149 (L) 11/14/2023 1257   PLT 251 06/05/2022 1024   MCV 101.9 (H) 11/14/2023 1257   MCV 86 06/05/2022 1024   MCH 32.7 11/14/2023 1257   MCHC 32.1 11/14/2023 1257   RDW 21.3 (H) 11/14/2023 1257   RDW 15.5 (H) 06/05/2022 1024   LYMPHSABS 0.3 (L) 11/14/2023 1257   LYMPHSABS 0.9 06/05/2022 1024   MONOABS 0.3 11/14/2023 1257   EOSABS 0.1 11/14/2023 1257   EOSABS 0.1 06/05/2022 1024   BASOSABS 0.0 11/14/2023 1257   BASOSABS 0.1 06/05/2022 1024    CMP     Component Value Date/Time   NA 140 11/14/2023 1257   NA 145 (H) 06/05/2022 1024   K 3.9 11/14/2023 1257   CL 105 11/14/2023 1257   CO2 27 11/14/2023 1257   GLUCOSE 112 (H) 11/14/2023 1257   BUN 12 11/14/2023 1257   BUN 17 06/05/2022 1024   CREATININE 0.71 11/14/2023 1257   CALCIUM 9.0 11/14/2023 1257   PROT 6.0 (L) 11/14/2023 1257   PROT 6.6 06/05/2022 1024   ALBUMIN 3.4  (L) 11/14/2023 1257   ALBUMIN 4.0 06/05/2022 1024   AST 38 11/14/2023 1257   ALT 18 11/14/2023 1257   ALKPHOS 308 (H) 11/14/2023 1257   BILITOT 0.5 11/14/2023 1257   GFRNONAA >60 11/14/2023 1257   GFRAA 81 02/03/2020 1049   GFRAA >60 12/19/2018 1434       ASSESSMENT and THERAPY PLAN:   Malignant neoplasm of upper-outer quadrant of right breast in female, estrogen receptor positive (HCC) Assessment and Plan Assessment & Plan Breast cancer with metastasis Improvement in physical activity noted. Dyspnea and nausea present. Leukopenia due to Verzenio . Hemoglobin at 8.6, platelets at 149,000. Discussed port placement for venous access and transfusions. - Continue Verzenio  and Faslodex . - Order scan at the end of May to assess tumor response. - Arrange for port placement in the next couple of weeks,pt preference, she is a hard access.  Dyspnea Dyspnea at rest and with activity. Concern for fluid accumulation related to cancer or treatment. - Order chest x-ray today to assess for fluid accumulation. - Consider ultrasound and drainage if x-ray indicates fluid accumulation.  Anemia in neoplastic disease Hemoglobin at 8.6. Fatigue present but manageable. No transfusion required at this time.  Nausea due to cancer treatment Nausea associated with Verzenio . Improvement with Zyprexa  at night. Nausea tolerable and managed. - Continue current medication regimen including Zyprexa  at  night.  Edema Mild swelling in feet. No immediate intervention required.    All questions were answered. The patient knows to call the clinic with any problems, questions or concerns. We can certainly see the patient much sooner if necessary.  Total encounter time:30 minutes*in face-to-face visit time, chart review, lab review, care coordination, order entry, and documentation of the encounter time.  *Total Encounter Time as defined by the Centers for Medicare and Medicaid Services includes, in addition to  the face-to-face time of a patient visit (documented in the note above) non-face-to-face time: obtaining and reviewing outside history, ordering and reviewing medications, tests or procedures, care coordination (communications with other health care professionals or caregivers) and documentation in the medical record.

## 2023-11-15 ENCOUNTER — Encounter: Payer: Self-pay | Admitting: Hematology and Oncology

## 2023-11-15 LAB — CANCER ANTIGEN 27.29: CA 27.29: 1165.4 U/mL — ABNORMAL HIGH (ref 0.0–38.6)

## 2023-11-16 ENCOUNTER — Other Ambulatory Visit: Payer: Self-pay | Admitting: *Deleted

## 2023-11-19 ENCOUNTER — Other Ambulatory Visit: Payer: Self-pay | Admitting: Radiology

## 2023-11-19 NOTE — H&P (Signed)
 Chief Complaint: Metastatic right breast cancer, poor venous access, dyspnea, bilateral pleural effusions; referred for image guided Port-A-Cath placement and thoracentesis  Referring Provider(s): Iruku,P  Supervising Physician: Erica Hau  Patient Status: [facility] - Out-pt  History of Present Illness: Doris Smith is a 73 y.o. female with past medical history sig for paroxysmal atrial fibrillation, anxiety, asthma, bipolar disorder, depression, fatty liver, fibromyalgia, GERD, IBS, sleep apnea, and metastatic right breast cancer with disease progression along with dyspnea and symptomatic bilateral pleural effusions.  She presents today for image guided Port-A-Cath placement and thoracentesis.  *** Patient is Full Code  Past Medical History:  Diagnosis Date   A-fib (HCC) 11/2012   PAF   Anxiety    takes Ativan    Asthma 04/07/2011   dx   Bipolar disorder (HCC)    Cancer (HCC)    Right breast   Depression    Early cataracts, bilateral    Fatty liver    Fibromyalgia    GERD (gastroesophageal reflux disease)    Irritable bowel syndrome    Mental disorder    dx bipolar   PONV (postoperative nausea and vomiting)    Sleep apnea 04/2011   does not wear CPAP   Tardive dyskinesia     Past Surgical History:  Procedure Laterality Date   ABDOMINAL HYSTERECTOMY     BIOPSY  08/17/2023   Procedure: BIOPSY;  Surgeon: Kenney Peacemaker, MD;  Location: [facility];  Service: Gastroenterology;;   COLONOSCOPY     DILATION AND CURETTAGE OF UTERUS  1981   abnormal pap   ESOPHAGOGASTRODUODENOSCOPY (EGD) WITH PROPOFOL  N/A 08/17/2023   Procedure: ESOPHAGOGASTRODUODENOSCOPY (EGD) WITH PROPOFOL ;  Surgeon: Kenney Peacemaker, MD;  Location: [facility];  Service: Gastroenterology;  Laterality: N/A;   HOT HEMOSTASIS N/A 08/17/2023   Procedure: HOT HEMOSTASIS (ARGON PLASMA COAGULATION/BICAP);  Surgeon: Kenney Peacemaker, MD;  Location: [facility];  Service: Gastroenterology;  Laterality: N/A;    IR THORACENTESIS ASP PLEURAL SPACE W/IMG GUIDE  09/13/2023   KNEE ARTHROSCOPY Left 2007   x 2   LUMBAR LAMINECTOMY/DECOMPRESSION MICRODISCECTOMY  05/31/2011   Procedure: LUMBAR LAMINECTOMY/DECOMPRESSION MICRODISCECTOMY;  Surgeon: Loel Ring;  Location: [facility];  Service: Orthopedics;  Laterality: Left;  Decompression Lumbar four to five and  Lumbar five to Sacral one on Left  (X-Ray)   MASTECTOMY W/ SENTINEL NODE BIOPSY Right 09/16/2018   Procedure: RIGHT MASTECTOMY WITH RIGHT AXILLARY SENTINEL LYMPH NODE BIOPSY;  Surgeon: Juanita Norlander, MD;  Location: [facility];  Service: General;  Laterality: Right;   PARTIAL HYSTERECTOMY  1982   PORTACATH PLACEMENT Left 10/31/2018   Procedure: INSERTION PORT-A-CATH WITH ULTRASOUND;  Surgeon: Juanita Norlander, MD;  Location: [facility];  Service: General;  Laterality: Left;   TONSILLECTOMY     as child   TOTAL KNEE ARTHROPLASTY Left 12/18/2005    Allergies: Iohexol, Codeine, Palonosetron , Prednisone , Sulfonamide derivatives, Celecoxib, Sulfa antibiotics, and Vitamin b12  Medications: Prior to Admission medications   Medication Sig Start Date End Date Taking? Authorizing Provider  abemaciclib  (VERZENIO ) 50 MG tablet Take 1 tablet (50 mg total) by mouth 2 (two) times daily. 11/07/23   Iruku, Praveena, MD  ciprofloxacin  (CIPRO ) 500 MG tablet Take 500 mg by mouth 2 (two) times daily. 10/15/23   [provider]  cyanocobalamin  1000 MCG tablet Take 1 tablet (1,000 mcg total) by mouth daily. 08/19/23   Leona Rake, MD  folic acid  (FOLVITE ) 1 MG tablet Take 1 tablet (1 mg total) by mouth daily.  Patient not taking: Reported on 10/23/2023 08/19/23   Leona Rake, MD  Incontinence Supply Disposable (CERTAINTY FITTED BRIEFS XL) MISC 1 Package by Does not apply route as needed. 11/30/22   Pickenpack-Cousar, Athena N, NP  lidocaine  (LIDODERM ) 5 % Place 2 patches onto the skin daily. Remove & Discard patch within 12 hours or as directed by MD 10/16/23    Pickenpack-Cousar, Athena N, NP  LORazepam  (ATIVAN ) 1 MG tablet Take 1 tablet (1 mg total) by mouth 2 (two) times daily as needed for anxiety. Patient not taking: Reported on 10/23/2023 05/24/23   Delfina Feller, FNP  OLANZapine  (ZYPREXA ) 2.5 MG tablet Take 1 tablet (2.5 mg total) by mouth at bedtime. For chronic nausea 09/25/23   Iruku, Praveena, MD  ondansetron  (ZOFRAN ) 8 MG tablet Take 1 tablet (8 mg total) by mouth every 8 (eight) hours as needed for nausea or vomiting. 08/28/23   Pickenpack-Cousar, Giles Labrum, NP  oxyCODONE  (OXYCONTIN ) 15 mg 12 hr tablet Take 1 tablet (15 mg total) by mouth every 12 (twelve) hours. 10/23/23   Pickenpack-Cousar, Athena N, NP  oxyCODONE -acetaminophen  (PERCOCET) 10-325 MG tablet Take 1 tablet by mouth every 4 (four) hours as needed for pain. 09/24/23   Pickenpack-Cousar, Athena N, NP  pantoprazole  (PROTONIX ) 40 MG tablet Take 1 tablet (40 mg total) by mouth 2 (two) times daily before a meal. 08/19/23   Leona Rake, MD  phenazopyridine  (PYRIDIUM ) 200 MG tablet Take 1 tablet (200 mg total) by mouth 3 (three) times daily as needed for pain. 10/16/23   Pickenpack-Cousar, Athena N, NP  polyethylene glycol (MIRALAX / GLYCOLAX) 17 g packet Take 17 g by mouth daily.    [provider]  pramipexole  (MIRAPEX ) 1 MG tablet Take 1 tablet (1 mg total) by mouth 3 (three) times daily. **NEEDS TO BE SEEN BEFORE NEXT REFILL** 10/01/23   Delfina Feller, FNP  prochlorperazine  (COMPAZINE ) 10 MG tablet Take 1 tablet (10 mg total) by mouth every 6 (six) hours as needed for nausea or vomiting. 08/02/23   Pickenpack-Cousar, Giles Labrum, NP     Family History  Problem Relation Age of Onset   Anesthesia problems Mother    Heart disease Mother        CHF, atrial fib   Heart failure Mother    Anesthesia problems Sister    Colon polyps Father    Heart disease Father        "Fluid around the heart"   Hypertension Sister    Breast cancer Maternal Aunt    Colon cancer Maternal  Aunt    Prostate cancer Neg Hx    Ovarian cancer Neg Hx     Social History   Socioeconomic History   Marital status: Married    Spouse name: Royston Cornea   Number of children: 2   Years of education: 12   Highest education level: Not on file  Occupational History   Occupation: Disabled  Tobacco Use   Smoking status: Never   Smokeless tobacco: Never  Vaping Use   Vaping status: Never Used  Substance and Sexual Activity   Alcohol use: No   Drug use: No   Sexual activity: Yes    Birth control/protection: Post-menopausal, Surgical  Other Topics Concern   Not on file  Social History Narrative   Tea daily.  Rarely has caffeine    Social Drivers of Corporate investment banker Strain: Patient Declined (10/22/2023)   Overall Financial Resource Strain (CARDIA)    Difficulty of Paying Living Expenses: Patient  declined  Food Insecurity: Patient Declined (10/22/2023)   Hunger Vital Sign    Worried About Running Out of Food in the Last Year: Patient declined    Ran Out of Food in the Last Year: Patient declined  Transportation Needs: No Transportation Needs (10/22/2023)   PRAPARE - Administrator, Civil Service (Medical): No    Lack of Transportation (Non-Medical): No  Physical Activity: Unknown (10/22/2023)   Exercise Vital Sign    Days of Exercise per Week: 0 days    Minutes of Exercise per Session: Not on file  Stress: Stress Concern Present (10/22/2023)   [employer] of Occupational Health - Occupational Stress Questionnaire    Feeling of Stress : To some extent  Social Connections: Unknown (10/22/2023)   Social Connection and Isolation Panel [NHANES]    Frequency of Communication with Friends and Family: More than three times a week    Frequency of Social Gatherings with Friends and Family: Twice a week    Attends Religious Services: Patient declined    Database administrator or Organizations: No    Attends Banker Meetings: Never    Marital Status:  Married       Review of Systems  Vital Signs:   Advance Care Plan: No documents on file  Physical Exam  Imaging: DG Chest 2 View Result Date: 11/16/2023 CLINICAL DATA:  Concern for pleural effusion. Short of breath. History of breast carcinoma. EXAM: CHEST - 2 VIEW COMPARISON:  09/25/2023 and older studies.  CT, 05/18/2023. FINDINGS: Cardiac silhouette is partly obscured by lung base opacities. Heart grossly normal in size. Large hiatal hernia. No mediastinal or hilar masses. No convincing adenopathy. Moderate bilateral pleural effusions obscure the hemidiaphragms and portions of the heart borders. Mild lung base opacity consistent with atelectasis. There is a focal area of peripheral opacity in the left upper lobe they may reflect atelectasis or scarring, stable from the prior chest radiograph and chest CT from 05/18/2023. Remainder of the lungs is clear. No pneumothorax. Skeletal structures are demineralized with numerous ill-defined areas of lucency consistent with widespread metastatic disease. Numerous thoracic vertebral compression fractures, not convincingly changed from the exam dated 09/25/2023. IMPRESSION: 1. No acute findings. 2. Moderate bilateral pleural effusions with associated lung base atelectasis. Stable focal atelectasis or scarring in the peripheral left upper lobe. No convincing pneumonia or evidence of pulmonary edema. 3. Stable changes of widespread osseous metastatic disease and multiple pathologic vertebral compression fractures. Electronically Signed   By: Amanda Jungling M.D.   On: 11/16/2023 09:33    Labs:  CBC: Recent Labs    09/25/23 1414 10/02/23 1047 10/16/23 1103 11/14/23 1257  WBC 5.1 4.2 2.9* 2.6*  HGB 10.9* 10.9* 9.6* 8.6*  HCT 34.1* 34.7* 30.6* 26.8*  PLT 141* 122* PLATELET CLUMPS NOTED ON SMEAR, COUNT APPEARS DECREASED 149*    COAGS: Recent Labs    08/18/23 0819  INR 1.2    BMP: Recent Labs    08/28/23 1555 09/17/23 1217 10/16/23 1103  11/14/23 1257  NA 137 140 141 140  K 3.8 3.7 4.0 3.9  CL 102 102 106 105  CO2 29 30 30 27   GLUCOSE 145* 126* 90 112*  BUN 10 10 9 12   CALCIUM 8.6* 8.2* 8.7* 9.0  CREATININE 0.45 0.66 0.64 0.71  GFRNONAA >60 >60 >60 >60    LIVER FUNCTION TESTS: Recent Labs    08/28/23 1555 09/17/23 1217 10/16/23 1103 11/14/23 1257  BILITOT 0.7 0.5 0.5 0.5  AST 39 28 30 38  ALT 34 26 16 18   ALKPHOS 196* 212* 388* 308*  PROT 5.5* 5.9* 5.6* 6.0*  ALBUMIN 2.9* 2.9* 3.1* 3.4*    TUMOR MARKERS: No results for input(s): "AFPTM", "CEA", "CA199", "CHROMGRNA" in the last 8760 hours.  Assessment and Plan: 73 y.o. female with past medical history sig for paroxysmal atrial fibrillation, anxiety, asthma, bipolar disorder, depression, fatty liver, fibromyalgia, GERD, IBS, sleep apnea, and metastatic right breast cancer with disease progression along with dyspnea and symptomatic bilateral pleural effusions.  She presents today for image guided Port-A-Cath placement and thoracentesis.  Details/risks of procedures, including but not limited to, internal bleeding, infection, injury to adjacent structures, pneumothorax discussed with patient with her understanding and consent.   Thank you for allowing our service to participate in Doris Smith 's care.  Patient known to IR team from right iliac lesion biopsy in 2024 and right thoracentesis on 09/13/2023    Electronically Signed: D. Honore Lux, PA-C   11/19/2023, 5:04 PM      I spent a total of    25 Minutes in face to face in clinical consultation, greater than 50% of which was counseling/coordinating care for image guided Port-A-Cath placement and thoracentesis

## 2023-11-20 ENCOUNTER — Ambulatory Visit (HOSPITAL_COMMUNITY)
Admission: RE | Admit: 2023-11-20 | Discharge: 2023-11-20 | Disposition: A | Discharge status: Home / Self Care | Source: Ambulatory Visit | Attending: Hematology and Oncology

## 2023-11-20 ENCOUNTER — Other Ambulatory Visit: Payer: Self-pay

## 2023-11-20 ENCOUNTER — Encounter (HOSPITAL_COMMUNITY): Payer: Self-pay

## 2023-11-20 ENCOUNTER — Ambulatory Visit (HOSPITAL_COMMUNITY)
Admission: RE | Admit: 2023-11-20 | Discharge: 2023-11-20 | Disposition: A | Discharge status: Home / Self Care | Source: Ambulatory Visit | Attending: Hematology and Oncology | Admitting: Hematology and Oncology

## 2023-11-20 DIAGNOSIS — J45909 Unspecified asthma, uncomplicated: Secondary | ICD-10-CM | POA: Diagnosis not present

## 2023-11-20 DIAGNOSIS — M797 Fibromyalgia: Secondary | ICD-10-CM | POA: Insufficient documentation

## 2023-11-20 DIAGNOSIS — F419 Anxiety disorder, unspecified: Secondary | ICD-10-CM | POA: Insufficient documentation

## 2023-11-20 DIAGNOSIS — K219 Gastro-esophageal reflux disease without esophagitis: Secondary | ICD-10-CM | POA: Insufficient documentation

## 2023-11-20 DIAGNOSIS — C50919 Malignant neoplasm of unspecified site of unspecified female breast: Secondary | ICD-10-CM | POA: Diagnosis not present

## 2023-11-20 DIAGNOSIS — F319 Bipolar disorder, unspecified: Secondary | ICD-10-CM | POA: Diagnosis not present

## 2023-11-20 DIAGNOSIS — K760 Fatty (change of) liver, not elsewhere classified: Secondary | ICD-10-CM | POA: Diagnosis not present

## 2023-11-20 DIAGNOSIS — J90 Pleural effusion, not elsewhere classified: Secondary | ICD-10-CM | POA: Insufficient documentation

## 2023-11-20 DIAGNOSIS — C50411 Malignant neoplasm of upper-outer quadrant of right female breast: Secondary | ICD-10-CM | POA: Diagnosis not present

## 2023-11-20 DIAGNOSIS — Z170 Estrogen receptor positive status [ER+]: Secondary | ICD-10-CM | POA: Diagnosis not present

## 2023-11-20 DIAGNOSIS — I480 Paroxysmal atrial fibrillation: Secondary | ICD-10-CM | POA: Insufficient documentation

## 2023-11-20 DIAGNOSIS — G4730 Sleep apnea, unspecified: Secondary | ICD-10-CM | POA: Diagnosis not present

## 2023-11-20 HISTORY — PX: IR IMAGING GUIDED PORT INSERTION: IMG5740

## 2023-11-20 HISTORY — PX: IR THORACENTESIS ASP PLEURAL SPACE W/IMG GUIDE: IMG5380

## 2023-11-20 MED ORDER — FENTANYL CITRATE (PF) 100 MCG/2ML IJ SOLN
INTRAMUSCULAR | Status: AC | PRN
  Administered 2023-11-20: 50 ug via INTRAVENOUS

## 2023-11-20 MED ORDER — MIDAZOLAM HCL 2 MG/2ML IJ SOLN
INTRAMUSCULAR | Status: AC
  Filled 2023-11-20: qty 2

## 2023-11-20 MED ORDER — LIDOCAINE HCL 1 % IJ SOLN
20.0000 mL | Freq: Once | INTRAMUSCULAR | Status: AC
  Administered 2023-11-20: 15 mL via INTRADERMAL

## 2023-11-20 MED ORDER — HEPARIN SOD (PORK) LOCK FLUSH 100 UNIT/ML IV SOLN
500.0000 [IU] | Freq: Once | INTRAVENOUS | Status: AC
  Administered 2023-11-20: 500 [IU] via INTRAVENOUS

## 2023-11-20 MED ORDER — SODIUM CHLORIDE 0.9 % IV SOLN: INTRAVENOUS | Status: DC

## 2023-11-20 MED ORDER — LIDOCAINE HCL 1 % IJ SOLN
INTRAMUSCULAR | Status: AC
Start: 2023-11-20 — End: ?
  Filled 2023-11-20: qty 40

## 2023-11-20 MED ORDER — FENTANYL CITRATE (PF) 100 MCG/2ML IJ SOLN
INTRAMUSCULAR | Status: AC
  Filled 2023-11-20: qty 2

## 2023-11-20 MED ORDER — HEPARIN SOD (PORK) LOCK FLUSH 100 UNIT/ML IV SOLN
INTRAVENOUS | Status: AC
  Filled 2023-11-20: qty 5

## 2023-11-20 MED ORDER — MIDAZOLAM HCL 2 MG/2ML IJ SOLN
INTRAMUSCULAR | Status: AC | PRN
  Administered 2023-11-20: 1 mg via INTRAVENOUS

## 2023-11-20 NOTE — Procedures (Signed)
 Interventional Radiology Procedure Note  Procedure: Single Lumen Power Port Placement    Access:  Right IJ vein.  Findings: Catheter tip positioned at SVC/RA junction. Port is ready for immediate use.   Complications: None  EBL: < 10 mL  Recommendations:  - Ok to shower in 24 hours - Do not submerge for 7 days - Routine line care   Maxi Carreras T. Fredia Sorrow, M.D Pager:  [phone]

## 2023-11-20 NOTE — Procedures (Signed)
 Ultrasound-guided  therapeutic right thoracentesis performed yielding 500 cc of yellow  fluid. No immediate complications. EBL none.

## 2023-11-20 NOTE — Discharge Instructions (Signed)
 Implanted Port Insertion, Care After  The following information offers guidance on how to care for yourself after your procedure. Your health care provider may also give you more specific instructions. If you have problems or questions, contact your health care provider.  What can I expect after the procedure? After the procedure, it is common to have: Discomfort at the port insertion site. Bruising on the skin over the port. This should improve over 3-4 days.   Urgent needs - Interventional Radiology, clinic [phone] (mon-fri 8-5).   Wound - May remove dressing and shower in 24 to 48 hours.  Keep site clean and dry.  Replace with bandaid as needed.  Do not submerge in tub or water until site healing well. If closed with glue, glue will flake off on its own.   If ordered by your provider, may start Emla cream (or any other creams ointments or lotions) in 2 weeks or after incision is healed. Port is ready for use immediately.   After completion of treatment, your provider should have you set up for monthly port flushes.   Follow these instructions at home: [facility] care After your port is placed, you will get a manufacturer's information card. The card has information about your port. Keep this card with you at all times. Take care of the port as told by your health care provider. Ask your health care provider if you or a family member can get training for taking care of the port at home. A home health care nurse will be be available to help care for the port. Make sure to remember what type of port you have. Incision care     Follow instructions from your health care provider about how to take care of your port insertion site. Make sure you: Wash your hands with soap and water for at least 20 seconds before and after you change your bandage (dressing). If soap and water are not available, use hand sanitizer. Change your dressing as told by your health care provider. Leave stitches  (sutures), skin glue, or adhesive strips in place. These skin closures may need to stay in place for 2 weeks or longer. If adhesive strip edges start to loosen and curl up, you may trim the loose edges. Do not remove adhesive strips completely unless your health care provider tells you to do that. Check your port insertion site every day for signs of infection. Check for: Redness, swelling, or pain. Fluid or blood. Warmth. Pus or a bad smell. Activity Return to your normal activities as told by your health care provider. Ask your health care provider what activities are safe for you. You may have to avoid lifting. Ask your health care provider how much you can safely lift. General instructions Take over-the-counter and prescription medicines only as told by your health care provider. Do not take baths, swim, or use a hot tub until your health care provider approves. Ask your health care provider if you may take showers. You may only be allowed to take sponge baths. If you were given a sedative during the procedure, it can affect you for several hours. Do not drive or operate machinery until your health care provider says that it is safe. Wear a medical alert bracelet in case of an emergency. This will tell any health care providers that you have a port. Keep all follow-up visits. This is important. Contact a health care provider if: You cannot flush your port with saline as directed, or you cannot  draw blood from the port. You have a fever or chills. You have redness, swelling, or pain around your port insertion site. You have fluid or blood coming from your port insertion site. Your port insertion site feels warm to the touch. You have pus or a bad smell coming from the port insertion site. Get help right away if: You have chest pain or shortness of breath. You have bleeding from your port that you cannot control. These symptoms may be an emergency. Get help right away. Call 911. Do not  wait to see if the symptoms will go away. Do not drive yourself to the hospital. Summary Take care of the port as told by your health care provider. Keep the manufacturer's information card with you at all times. Change your dressing as told by your health care provider. Contact a health care provider if you have a fever or chills or if you have redness, swelling, or pain around your port insertion site. Keep all follow-up visits. This information is not intended to replace advice given to you by your health care provider. Make sure you discuss any questions you have with your health care provider. Document Revised: 12/28/2020 Document Reviewed: 12/28/2020 Elsevier Patient Education  2023 Elsevier Inc.    Moderate Conscious Sedation  Adult  Care After (English)  After the procedure, it is common to have: Sleepiness for a few hours. Impaired judgment for a few hours. Trouble with balance. Nausea or vomiting if you eat too soon. Follow these instructions at home: For the time period you were told by your health care provider:  Rest. Do not participate in activities where you could fall or become injured. Do not drive or use machinery. Do not drink alcohol. Do not take sleeping pills or medicines that cause drowsiness. Do not make important decisions or sign legal documents. Do not take care of children on your own. Eating and drinking Follow instructions from your health care provider about what you may eat and drink. Drink enough fluid to keep your urine pale yellow. If you vomit: Drink clear fluids slowly and in small amounts as you are able. Clear fluids include water, ice chips, low-calorie sports drinks, and fruit juice that has water added to it (diluted fruit juice). Eat light and bland foods in small amounts as you are able. These foods include bananas, applesauce, rice, lean meats, toast, and crackers. General instructions Take over-the-counter and prescription medicines  only as told by your health care provider. Have a responsible adult stay with you for the time you are told. Do not use any products that contain nicotine or tobacco. These products include cigarettes, chewing tobacco, and vaping devices, such as e-cigarettes. If you need help quitting, ask your health care provider. Return to your normal activities as told by your health care provider. Ask your health care provider what activities are safe for you. Your health care provider may give you more instructions. Make sure you know what you can and cannot do. Contact a health care provider if: You are still sleepy or having trouble with balance after 24 hours. You feel light-headed. You vomit every time you eat or drink. You get a rash. You have a fever. You have redness or swelling around the IV site. Get help right away if: You have trouble breathing. You start to feel confused at home. These symptoms may be an emergency. Get help right away. Call 911. Do not wait to see if the symptoms will go away. Do not  drive yourself to the hospital. This information is not intended to replace advice given to you by your health care provider. Make sure you discuss any questions you have with your health care provider.

## 2023-11-21 ENCOUNTER — Telehealth: Payer: Self-pay | Admitting: Nurse Practitioner

## 2023-11-21 ENCOUNTER — Other Ambulatory Visit: Payer: Self-pay

## 2023-11-21 ENCOUNTER — Other Ambulatory Visit: Payer: Self-pay | Admitting: Nurse Practitioner

## 2023-11-21 ENCOUNTER — Other Ambulatory Visit: Payer: Self-pay | Admitting: Hematology and Oncology

## 2023-11-21 ENCOUNTER — Other Ambulatory Visit (HOSPITAL_COMMUNITY): Payer: Self-pay

## 2023-11-21 DIAGNOSIS — G893 Neoplasm related pain (acute) (chronic): Secondary | ICD-10-CM

## 2023-11-21 DIAGNOSIS — Z515 Encounter for palliative care: Secondary | ICD-10-CM

## 2023-11-21 DIAGNOSIS — C50411 Malignant neoplasm of upper-outer quadrant of right female breast: Secondary | ICD-10-CM

## 2023-11-21 MED ORDER — OXYCODONE HCL ER 15 MG PO T12A
15.0000 mg | EXTENDED_RELEASE_TABLET | Freq: Two times a day (BID) | ORAL | 0 refills | Status: DC
  Filled 2023-11-21: qty 60, 30d supply, fill #0

## 2023-11-21 NOTE — Telephone Encounter (Signed)
 Doris Smith is aware of her appointments on 6/4.

## 2023-11-30 ENCOUNTER — Ambulatory Visit (HOSPITAL_COMMUNITY)
Admission: RE | Admit: 2023-11-30 | Discharge: 2023-11-30 | Disposition: A | Discharge status: Home / Self Care | Source: Ambulatory Visit | Attending: Hematology and Oncology | Admitting: Hematology and Oncology

## 2023-11-30 DIAGNOSIS — C50411 Malignant neoplasm of upper-outer quadrant of right female breast: Secondary | ICD-10-CM | POA: Diagnosis not present

## 2023-11-30 DIAGNOSIS — C7951 Secondary malignant neoplasm of bone: Secondary | ICD-10-CM | POA: Diagnosis not present

## 2023-11-30 DIAGNOSIS — R918 Other nonspecific abnormal finding of lung field: Secondary | ICD-10-CM | POA: Diagnosis not present

## 2023-11-30 DIAGNOSIS — Z170 Estrogen receptor positive status [ER+]: Secondary | ICD-10-CM | POA: Insufficient documentation

## 2023-11-30 DIAGNOSIS — C787 Secondary malignant neoplasm of liver and intrahepatic bile duct: Secondary | ICD-10-CM | POA: Diagnosis not present

## 2023-11-30 LAB — GLUCOSE, CAPILLARY: Glucose-Capillary: 86 mg/dL (ref 70–99)

## 2023-11-30 MED ORDER — FLUDEOXYGLUCOSE F - 18 (FDG) INJECTION
7.5900 | Freq: Once | INTRAVENOUS | Status: AC
  Administered 2023-11-30: 7.59 via INTRAVENOUS

## 2023-12-04 ENCOUNTER — Other Ambulatory Visit (HOSPITAL_COMMUNITY): Payer: Self-pay

## 2023-12-04 ENCOUNTER — Other Ambulatory Visit: Payer: Self-pay

## 2023-12-04 ENCOUNTER — Telehealth: Payer: Self-pay | Admitting: *Deleted

## 2023-12-04 ENCOUNTER — Encounter: Payer: Self-pay | Admitting: Hematology and Oncology

## 2023-12-04 NOTE — Progress Notes (Signed)
 Specialty Pharmacy Refill Coordination Note  Doris Smith is a 73 y.o. female contacted today regarding refills of specialty medication(s) Abemaciclib  (VERZENIO )   Patient requested Delivery   Delivery date: 12/07/23   Verified address: [address] [place], [place] [address]   Medication will be filled on 12/06/23.

## 2023-12-04 NOTE — Progress Notes (Signed)
 Specialty Pharmacy Ongoing Clinical Assessment Note  Doris Smith is a 73 y.o. female who is being followed by the specialty pharmacy service for RxSp Oncology   Patient's specialty medication(s) reviewed today: Abemaciclib  (VERZENIO )   Missed doses in the last 4 weeks: 0   Patient/Caregiver did not have any additional questions or concerns.   Therapeutic benefit summary: Patient is achieving benefit   Adverse events/side effects summary: No adverse events/side effects   Patient's therapy is appropriate to: Continue    Goals Addressed             This Visit's Progress    Maintain optimal adherence to therapy   On track    Patient is initiating therapy. Patient will maintain adherence         Follow up: 3 months  Malachi Screws Specialty Pharmacist

## 2023-12-10 ENCOUNTER — Encounter: Payer: Self-pay | Admitting: Hematology and Oncology

## 2023-12-10 NOTE — Telephone Encounter (Signed)
 No entry

## 2023-12-11 ENCOUNTER — Telehealth: Payer: Self-pay | Admitting: *Deleted

## 2023-12-11 ENCOUNTER — Encounter: Payer: Self-pay | Admitting: Hematology and Oncology

## 2023-12-11 ENCOUNTER — Telehealth: Payer: Self-pay

## 2023-12-11 ENCOUNTER — Other Ambulatory Visit: Payer: Self-pay | Admitting: *Deleted

## 2023-12-11 DIAGNOSIS — J910 Malignant pleural effusion: Secondary | ICD-10-CM

## 2023-12-11 DIAGNOSIS — D649 Anemia, unspecified: Secondary | ICD-10-CM

## 2023-12-11 DIAGNOSIS — Z170 Estrogen receptor positive status [ER+]: Secondary | ICD-10-CM

## 2023-12-11 NOTE — Telephone Encounter (Signed)
 This RN spoke with pt per her call stating " I am not feeling well and want my blood checked tomorrow for blood "  She states she is having increased fatigue " and just not feeling good".   She states she is noticing bruising on her legs.  She denies any active bleeding.  She also states increase SHOB.  Note pt is scheduled to see Palliative - then get labs and then will see Dr Arno Bibles,  [name] states she is taking medications as prescribed.  Per discussion labs added for Type and cross.

## 2023-12-11 NOTE — Telephone Encounter (Signed)
 Verbally confirmed appts for 6/4

## 2023-12-12 ENCOUNTER — Inpatient Hospital Stay

## 2023-12-12 ENCOUNTER — Encounter: Payer: Self-pay | Admitting: Hematology and Oncology

## 2023-12-12 ENCOUNTER — Inpatient Hospital Stay: Admitting: Hematology and Oncology

## 2023-12-12 ENCOUNTER — Inpatient Hospital Stay (HOSPITAL_BASED_OUTPATIENT_CLINIC_OR_DEPARTMENT_OTHER): Admitting: Nurse Practitioner

## 2023-12-12 ENCOUNTER — Other Ambulatory Visit: Payer: Self-pay

## 2023-12-12 ENCOUNTER — Encounter: Payer: Self-pay | Admitting: Nurse Practitioner

## 2023-12-12 ENCOUNTER — Inpatient Hospital Stay: Attending: Hematology and Oncology

## 2023-12-12 VITALS — BP 110/86 | HR 83 | Temp 98.3°F | Resp 20 | Wt 153.8 lb

## 2023-12-12 DIAGNOSIS — G893 Neoplasm related pain (acute) (chronic): Secondary | ICD-10-CM | POA: Diagnosis not present

## 2023-12-12 DIAGNOSIS — Z5111 Encounter for antineoplastic chemotherapy: Secondary | ICD-10-CM | POA: Insufficient documentation

## 2023-12-12 DIAGNOSIS — C50411 Malignant neoplasm of upper-outer quadrant of right female breast: Secondary | ICD-10-CM | POA: Diagnosis not present

## 2023-12-12 DIAGNOSIS — Z1732 Human epidermal growth factor receptor 2 negative status: Secondary | ICD-10-CM | POA: Diagnosis not present

## 2023-12-12 DIAGNOSIS — Z1721 Progesterone receptor positive status: Secondary | ICD-10-CM | POA: Insufficient documentation

## 2023-12-12 DIAGNOSIS — C787 Secondary malignant neoplasm of liver and intrahepatic bile duct: Secondary | ICD-10-CM | POA: Insufficient documentation

## 2023-12-12 DIAGNOSIS — R630 Anorexia: Secondary | ICD-10-CM

## 2023-12-12 DIAGNOSIS — Z170 Estrogen receptor positive status [ER+]: Secondary | ICD-10-CM | POA: Diagnosis not present

## 2023-12-12 DIAGNOSIS — Z515 Encounter for palliative care: Secondary | ICD-10-CM

## 2023-12-12 DIAGNOSIS — C7951 Secondary malignant neoplasm of bone: Secondary | ICD-10-CM | POA: Diagnosis not present

## 2023-12-12 DIAGNOSIS — R110 Nausea: Secondary | ICD-10-CM

## 2023-12-12 DIAGNOSIS — Z9011 Acquired absence of right breast and nipple: Secondary | ICD-10-CM | POA: Diagnosis not present

## 2023-12-12 DIAGNOSIS — Z923 Personal history of irradiation: Secondary | ICD-10-CM | POA: Insufficient documentation

## 2023-12-12 DIAGNOSIS — D649 Anemia, unspecified: Secondary | ICD-10-CM

## 2023-12-12 DIAGNOSIS — R634 Abnormal weight loss: Secondary | ICD-10-CM

## 2023-12-12 DIAGNOSIS — Z9221 Personal history of antineoplastic chemotherapy: Secondary | ICD-10-CM | POA: Diagnosis not present

## 2023-12-12 DIAGNOSIS — R530 Neoplastic (malignant) related fatigue: Secondary | ICD-10-CM

## 2023-12-12 DIAGNOSIS — J910 Malignant pleural effusion: Secondary | ICD-10-CM

## 2023-12-12 LAB — CMP (CANCER CENTER ONLY)
ALT: 14 U/L (ref 0–44)
AST: 34 U/L (ref 15–41)
Albumin: 3.4 g/dL — ABNORMAL LOW (ref 3.5–5.0)
Alkaline Phosphatase: 339 U/L — ABNORMAL HIGH (ref 38–126)
Anion gap: 5 (ref 5–15)
BUN: 10 mg/dL (ref 8–23)
CO2: 27 mmol/L (ref 22–32)
Calcium: 8.8 mg/dL — ABNORMAL LOW (ref 8.9–10.3)
Chloride: 107 mmol/L (ref 98–111)
Creatinine: 0.69 mg/dL (ref 0.44–1.00)
GFR, Estimated: >60 mL/min (ref >60)
Glucose, Bld: 144 mg/dL — ABNORMAL HIGH (ref 70–99)
Potassium: 3.8 mmol/L (ref 3.5–5.1)
Sodium: 139 mmol/L (ref 135–145)
Total Bilirubin: 0.4 mg/dL (ref 0.0–1.2)
Total Protein: 5.9 g/dL — ABNORMAL LOW (ref 6.5–8.1)

## 2023-12-12 LAB — SAMPLE TO BLOOD BANK

## 2023-12-12 LAB — CBC WITH DIFFERENTIAL (CANCER CENTER ONLY)
Abs Immature Granulocytes: 0.01 10*3/uL (ref 0.00–0.07)
Basophils Absolute: 0 10*3/uL (ref 0.0–0.1)
Basophils Relative: 1 %
Eosinophils Absolute: 0.1 10*3/uL (ref 0.0–0.5)
Eosinophils Relative: 4 %
HCT: 27.1 % — ABNORMAL LOW (ref 36.0–46.0)
Hemoglobin: 9 g/dL — ABNORMAL LOW (ref 12.0–15.0)
Immature Granulocytes: 0 %
Lymphocytes Relative: 13 %
Lymphs Abs: 0.3 10*3/uL — ABNORMAL LOW (ref 0.7–4.0)
MCH: 34.1 pg — ABNORMAL HIGH (ref 26.0–34.0)
MCHC: 33.2 g/dL (ref 30.0–36.0)
MCV: 102.7 fL — ABNORMAL HIGH (ref 80.0–100.0)
Monocytes Absolute: 0.2 10*3/uL (ref 0.1–1.0)
Monocytes Relative: 9 %
Neutro Abs: 1.9 10*3/uL (ref 1.7–7.7)
Neutrophils Relative %: 73 %
Platelet Count: 118 10*3/uL — ABNORMAL LOW (ref 150–400)
RBC: 2.64 MIL/uL — ABNORMAL LOW (ref 3.87–5.11)
RDW: 14.8 % (ref 11.5–15.5)
WBC Count: 2.6 10*3/uL — ABNORMAL LOW (ref 4.0–10.5)
nRBC: 0 % (ref 0.0–0.2)

## 2023-12-12 MED ORDER — HEPARIN SOD (PORK) LOCK FLUSH 100 UNIT/ML IV SOLN
500.0000 [IU] | Freq: Once | INTRAVENOUS | Status: AC | PRN
  Administered 2023-12-12: 500 [IU]

## 2023-12-12 MED ORDER — ABEMACICLIB 100 MG PO TABS
100.0000 mg | ORAL_TABLET | Freq: Two times a day (BID) | ORAL | 0 refills | Status: DC
  Filled 2023-12-13: qty 42, 21d supply, fill #0

## 2023-12-12 MED ORDER — FULVESTRANT 250 MG/5ML IM SOSY
500.0000 mg | PREFILLED_SYRINGE | Freq: Once | INTRAMUSCULAR | Status: AC
  Administered 2023-12-12: 500 mg via INTRAMUSCULAR
  Filled 2023-12-12: qty 10

## 2023-12-12 MED ORDER — SODIUM CHLORIDE 0.9% FLUSH
10.0000 mL | Freq: Once | INTRAVENOUS | Status: AC | PRN
  Administered 2023-12-12: 10 mL

## 2023-12-12 NOTE — Progress Notes (Signed)
 Palliative Medicine [facility]  [phone] [phone]   Name: Doris Smith Date: 12/12/2023 MRN: [id number]  DOB: 05-Oct-1950  Patient Care Team: Delfina Feller, FNP as PCP - General (Family Medicine) Tania Familia, NP as PCP - Cardiology (Nurse Practitioner) Juanita Norlander, MD as Consulting Physician (General Surgery) Johna Myers, MD as Consulting Physician (Radiation Oncology) Stephania Eglin, MD as Consulting Physician (Pulmonary Disease) Winston Hawking, MD as Consulting Physician (Orthopedic Surgery) Orvan Blanch, MD as Consulting Physician (Orthopedic Surgery) Elenora Griffiths, NP as Nurse Practitioner (Adult Health Nurse Practitioner) Auther Bo, RN as Oncology Nurse Navigator Alane Hsu, RN as Oncology Nurse Navigator Alger Infield, MD as Consulting Physician (Plastic Surgery) Eilleen Grates, MD as Consulting Physician (Cardiology) Murleen Arms, MD as Medical Oncologist (Hematology and Oncology) Pickenpack-Cousar, Giles Labrum, NP as Nurse Practitioner (Hospice and Palliative Medicine)    INTERVAL HISTORY: Doris Smith is a 73 y.o. female with oncologic medical history including estrogen receptor positive breast cancer(07/2018) s/p right mastectomy. PET scan 10/06/22 showing widespread osseous metastasis. Other pertinent history includes atrial fibrillation, asthma, OSA, GERD, fatty liver, fibromyalgia, osteoporosis, chronic headaches, anxiety, depression, and bipolar disorder. Underwent ORIF of right hip 09/20/2022. Palliative ask to see for symptom and pain management and goals of care.   SOCIAL HISTORY:     reports that she has never smoked. She has never used smokeless tobacco. She reports that she does not drink alcohol and does not use drugs.  ADVANCE DIRECTIVES:  On file  CODE STATUS: Full code  PAST MEDICAL HISTORY: Past Medical History:  Diagnosis Date   A-fib (HCC) 11/2012   PAF    Anxiety    takes Ativan    Asthma 04/07/2011   dx   Bipolar disorder (HCC)    Cancer (HCC)    Right breast   Depression    Early cataracts, bilateral    Fatty liver    Fibromyalgia    GERD (gastroesophageal reflux disease)    Irritable bowel syndrome    Mental disorder    dx bipolar   PONV (postoperative nausea and vomiting)    Sleep apnea 04/2011   does not wear CPAP   Tardive dyskinesia     ALLERGIES:  is allergic to iohexol, codeine, palonosetron , prednisone , sulfonamide derivatives, celecoxib, sulfa antibiotics, and vitamin b12.  MEDICATIONS:  Current Outpatient Medications  Medication Sig Dispense Refill   abemaciclib  (VERZENIO ) 100 MG tablet Take 1 tablet (100 mg total) by mouth 2 (two) times daily. 30 tablet 0   ciprofloxacin  (CIPRO ) 500 MG tablet Take 500 mg by mouth 2 (two) times daily.     cyanocobalamin  1000 MCG tablet Take 1 tablet (1,000 mcg total) by mouth daily. 60 tablet 0   folic acid  (FOLVITE ) 1 MG tablet Take 1 tablet (1 mg total) by mouth daily. (Patient not taking: Reported on 10/23/2023) 60 tablet 0   Incontinence Supply Disposable (CERTAINTY FITTED BRIEFS XL) MISC 1 Package by Does not apply route as needed. 56 each 3   lidocaine  (LIDODERM ) 5 % Place 2 patches onto the skin daily. Remove & Discard patch within 12 hours or as directed by MD 60 patch 2   LORazepam  (ATIVAN ) 1 MG tablet Take 1 tablet (1 mg total) by mouth 2 (two) times daily as needed for anxiety. (Patient not taking: Reported on 10/23/2023) 60 tablet 5   OLANZapine  (ZYPREXA ) 2.5 MG tablet TAKE 1 TABLET BY MOUTH ONCE DAILY AT BEDTIME 30 tablet 0   ondansetron  (  ZOFRAN ) 8 MG tablet Take 1 tablet (8 mg total) by mouth every 8 (eight) hours as needed for nausea or vomiting. 30 tablet 5   oxyCODONE  (OXYCONTIN ) 15 mg 12 hr tablet Take 1 tablet (15 mg total) by mouth every 12 (twelve) hours. 60 tablet 0   oxyCODONE -acetaminophen  (PERCOCET) 10-325 MG tablet Take 1 tablet by mouth every 4 (four) hours as  needed for pain. 60 tablet 0   pantoprazole  (PROTONIX ) 40 MG tablet Take 1 tablet (40 mg total) by mouth 2 (two) times daily before a meal. 120 tablet 0   phenazopyridine  (PYRIDIUM ) 200 MG tablet Take 1 tablet (200 mg total) by mouth 3 (three) times daily as needed for pain. 10 tablet 0   polyethylene glycol (MIRALAX / GLYCOLAX) 17 g packet Take 17 g by mouth daily.     pramipexole  (MIRAPEX ) 1 MG tablet Take 1 tablet (1 mg total) by mouth 3 (three) times daily. **NEEDS TO BE SEEN BEFORE NEXT REFILL** 90 tablet 0   prochlorperazine  (COMPAZINE ) 10 MG tablet Take 1 tablet (10 mg total) by mouth every 6 (six) hours as needed for nausea or vomiting. 30 tablet 2   No current facility-administered medications for this visit.   Facility-Administered Medications Ordered in Other Visits  Medication Dose Route Frequency Provider Last Rate Last Admin   heparin  lock flush 100 unit/mL  500 Units Intravenous Once Magrinat, Gustav C, MD       sodium chloride  flush (NS) 0.9 % injection 10 mL  10 mL Intravenous PRN Magrinat, Rozella Cornfield, MD        VITAL SIGNS: BP 110/86 (BP Location: Left Arm, Patient Position: Sitting)   Pulse 83   Temp 98.3 F (36.8 C) (Oral)   Resp 20   Wt 153 lb 12.8 oz (69.8 kg)   SpO2 100%   BMI 26.40 kg/m  Filed Weights   12/12/23 1352  Weight: 153 lb 12.8 oz (69.8 kg)    Estimated body mass index is 26.4 kg/m as calculated from the following:   Height as of 11/20/23: 5\' 4"  (1.626 m).   Weight as of this encounter: 153 lb 12.8 oz (69.8 kg).   PERFORMANCE STATUS (ECOG) : 1 - Symptomatic but completely ambulatory  Physical Exam General: NAD Cardiovascular: regular rate and rhythm Pulmonary: normal breathing pattern Extremities: no edema, no joint deformities Skin: no rashes Neurological: AAO x3  IMPRESSION: Discussed the use of AI scribe software for clinical note transcription with the patient, who gave verbal consent to proceed.  History of Present Illness Doris Smith "Doris Smith" is a 73 year old female with cancer who presents with fatigue and nausea. She is accompanied by her husband. Patient in wheelchair. Denies concerns of diarrhea. Persistent fatigue over the past week. Prior to this she was doing well. Shares she has been attending church on Sundays.   She experiences severe fatigue that worsens as the day progresses, significantly impacting her daily activities. She is working on improving her mobility, currently able to walk half the hallway independently, with her husband providing support with a wheelchair. She can walk the full hallway while pushing the wheelchair.  She has persistent nausea without vomiting, which is managed with regular antiemetic medication. The nausea contributes to reduced food intake and weight loss. She has lost 91 pounds over the past year with weight of 243lbs on 08/25/22. Current weight of 153 pounds, down from 155 pounds on 11/14/23. She feels her appetite fluctuates. Some days are better than others.  She tries to drink ensure supplement for additional protein.   She has noticed new small spots on her skin and is concerned about their relation to her blood count. Patient is scheduled to see her oncologist today.   Her oncology regimen includes continuous oral chemotherapy pills (Verzenio ) and Faslodex  injection. Doris Smith states her previous regimen with Ibrance  was not well-tolerated. The current regimen is better tolerated, with more good days than bad per patient. She experiences mild pain, which is managed with medication as needed.  Mrs. Ricketts reports her pain is much better controlled with current regimen.  She and spouse expresses their appreciation. We discussed her current pain regimen at length which consists of OxyContin  15 mg twice daily for pain, and oxycodone  10 mg as needed for severe/breakthrough pain. Occasionally, she takes Tylenol  for pain relief.  Not requiring breakthrough medication around-the-clock.  No  adjustments to current regimen.  We will continue to closely monitor and support as needed.  All questions answered and support provided.   Goals of Care 11/30/22: Mrs. Basden is emotional. Her husband becomes emotional as patient shares she knows her health is not the best and her cancer is not curable. She is taking life one day at time. Speaks to when her time comes she will be ready but hopeful it is not now. She knows that no one has a definitive time frame. Expresses her appreciation in all of the care and support. Expresses her goal is to continue to treat the treatable allowing her the ability to continue to thrive while not suffering.    4/30: Since the night of 10/30/22, Mrs. Gladson has continued to meet goal of lying in her own bed at night beside her husband. She expresses appreciation for the care of everyone at the cancer center and is hopeful for continued improvement.  I discussed the importance of continued conversation with family and their medical providers regarding overall plan of care and treatment options, ensuring decisions are within the context of the patients values and GOCs.  Assessment and Plan Assessment & Plan  Nausea Persistent nausea managed with anti-nausea medication, contributing to weight loss and decreased appetite. - Continue anti-nausea medication every eight hours as needed.   Weight loss Weight decreased from 155 to 153 pounds, likely due to nausea and decreased appetite. -Education provided on continued use of anti-emetics. -Focus on small frequent meals and snacking.  -Protein shakes such as boost or ensure.   Fatigue Ongoing fatigue possibly related to anemia and chemotherapy. - Encourage gradual increase in physical activity as tolerated.  Anemia Suspected due to fatigue and presence of petechiae. Blood count to be checked. - Order blood work to assess hemoglobin and platelet levels. - Consider transfusion if indicated by blood count.  Cancer  related pain Management Pain controlled with Tylenol , oxycodone , OxyContin , and lidocaine  patches. Prior authorization needed for patches.  Will work to obtain.  No adjustments to current regimen at this time. - Request prior authorization for lidocaine  patches. - Continue OxyContin  15 mg every 12 hours.   -Continue lidocaine  patches as needed. - Continue Percocet 10/325 mg 1 tablet every 4 hours as needed for severe/breakthrough pain.  Follow-up Blood to be drawn through port for the first time after this appointment. - Proceed to lab for blood draw through port. - Follow up on lab results to determine further management per oncology.  -I will plan to see patient back in 3-4 weeks.   Patient expressed understanding and was in agreement with this  plan. She also understands that She can call the clinic at any time with any questions, concerns, or complaints.   Any controlled substances utilized were prescribed in the context of palliative care. PDMP has been reviewed.   Visit consisted of counseling and education dealing with the complex and emotionally intense issues of symptom management and palliative care in the setting of serious and potentially life-threatening illness.  Dellia Ferguson, AGPCNP-BC  Palliative Medicine Team/[facility]

## 2023-12-12 NOTE — Assessment & Plan Note (Addendum)
 Assessment and Plan Assessment & Plan  Assessment and Plan Assessment & Plan Stage 4 Breast Cancer with bone, liver, gastrointestinal disease. progression despite Verzenio . PET scan: mixed response, bone improvement, new liver metastases. Current treatment inadequate. Discussed options: increase Verzenio , switch to Orserdu, Truqap with faslodex , Xeloda. Risks.  - Engaged in shared decision-making, considering tolerance, cost. Prefers increasing Verzenio . - Increase Verzenio  dose to 100 mg twice daily. - Monitor for gastrointestinal side effects, particularly diarrhea. - Check tumor marker in one month to assess response. - Consider switching to Orserdu or Xeloda if Verzenio  dose increase is ineffective.  Pleural Effusion Pleural effusion on PET scan, likely cancer-related.  - Monitor dyspnea and consider fluid drainage if symptoms worsen.  Hiatal Hernia Large hiatal hernia on PET scan, unrelated to cancer. Potential contributor to gastrointestinal symptoms. Surgical intervention not advisable.  Anxiety Anxiety potentially contributing to nausea and overall well-being.  Goals of Care Desires to continue treatment despite progression, understanding seriousness. Willing to try new treatments to maintain quality of life.  Follow-up  - Check in within a couple of weeks to assess tolerance to increased Verzenio  dose.

## 2023-12-12 NOTE — Progress Notes (Signed)
 [facility] Cancer Follow up:    Doris Feller, FNP [address] [place] [address]   DIAGNOSIS:  Cancer Staging  Malignant neoplasm of upper-outer quadrant of right breast in female, estrogen receptor positive (HCC) Staging form: Breast, AJCC 8th Edition - Pathologic: Stage IB (pT3, pN42mi, cM0, G2, ER+, PR+, HER2-) - Signed by Percival Brace, NP on 10/02/2018 Multigene prognostic tests performed: MammaPrint Histologic grading system: 3 grade system - Clinical stage from 12/28/2022: Stage IV (rcT2, cN0, cM1, G3, ER+, PR+, HER2-) - Signed by Murleen Arms, MD on 12/28/2022 Stage prefix: Recurrence Histologic grading system: 3 grade system Laterality: Right Staged by: Pathologist and managing physician Stage used in treatment planning: Yes National guidelines used in treatment planning: Yes Type of national guideline used in treatment planning: NCCN   SUMMARY OF ONCOLOGIC HISTORY: Oncology History  Malignant neoplasm of upper-outer quadrant of right breast in female, estrogen receptor positive (HCC)  08/08/2018 Initial Diagnosis   status post right breast biopsy 08/02/2018 for a clinical T3 N0, stage IIA invasive lobular carcinoma, grade 2, estrogen receptor strongly positive, progesterone receptor 1% positive, with no HER-2 amplification and an MIB-1 of 1%.             (a) CT scan of the head and chest, without contrast 08/23/2018 showed nonspecific 0.4 cm left lower lobe pulmonary nodule, no definitive metastatic disease             (b) bone scan 08/23/2018 shows multiple spinal areas of abnormal uptake, but             (c) total spinal MRI 09/03/2018 finds bone scan findings to be secondary to degenerative disease, no evidence of metastatic disease.   08/2018 - 10/2022 Anti-estrogen oral therapy   Anastrozole ; discontinued 10/14/2018 in preparation for chemotherapy, resumed October 2020   09/16/2018 Surgery   Right mastectomy [place])  [phone]): Invasive Lobular Carcinoma, 10.5 cm, grade 2, negative margins. 1 of 5 lymph nodes positive for carcinoma.   09/16/2018 Miscellaneous   MammaPrint "high risk" suggests a 5-year metastasis free survival of 93% with chemotherapy, with an absolute chemotherapy benefit in the greater than 12% range    09/16/2018 Miscellaneous   Caris testing on mastectomy sample (09/16/2018) showed stable MSI and proficient mismatch repair status, with a low mutational burden; BRCA 1 and 2 were not mutated, PI K3 was not mutated, ER B B2 was not mutated, and AKT 1 was not mutated. 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 Administration: 0.25 mg (11/05/2018), 0.25 mg (11/26/2018), 0.25 mg (12/17/2018), 0.25 mg (01/07/2019), 0.25 mg (01/28/2019), 0.25 mg (02/18/2019), 0.25 mg (03/11/2019), 0.25 mg (04/02/2019)  methotrexate  (PF) chemo injection 84 mg, 39.8 mg/m2 = 84.5 mg, Intravenous,  Once, 5 of 5 cycles. Administration: 84 mg (11/05/2018), 84 mg (11/26/2018), 84 mg (12/17/2018), 84 mg (03/11/2019), 84 mg (04/02/2019)  pegfilgrastim -cbqv (UDENYCA ) injection 6 mg, 6 mg, Subcutaneous, Once, 6 of 6 cycles. Administration: 6 mg (12/19/2018)  cyclophosphamide  (CYTOXAN ) 1,260 mg in sodium chloride  0.9 % 250 mL chemo infusion, 600 mg/m2 = 1,260 mg, Intravenous,  Once, 8 of 8 cycle. Administration: 1,260 mg (11/05/2018), 1,260 mg (11/26/2018), 1,260 mg (12/17/2018), 1,260 mg (01/07/2019), 1,260 mg (01/28/2019), 1,260 mg (02/18/2019), 1,260 mg (03/11/2019), 1,260 mg (04/02/2019)  fluorouracil  (ADRUCIL ) chemo injection 1,250  mg, 600 mg/m2 = 1,250 mg, Intravenous,  Once, 8 of 8 cycles. Administration: 1,250 mg (11/05/2018), 1,250 mg (11/26/2018), 1,250 mg (12/17/2018), 1,250 mg (01/07/2019), 1,250 mg (01/28/2019), 1,250 mg (02/18/2019), 1,250 mg (03/11/2019), 1,250 mg  (04/02/2019).    12/31/2018 - 02/19/2019 Radiation Therapy   The patient initially received a dose of 50.4 Gy in 28 fractions to the chest wall and supraclavicular region. This was delivered using a 3-D conformal, 4 field technique. The patient then received a boost to the mastectomy scar. This delivered an additional 10 Gy in 5 fractions using an en face electron field. The total dose was 60.4 Gy.   09/12/2022 Imaging   Bone scan on 09/12/2022 shows uptake in ribs, manubrium. Widespread osseous metastasis confirmed on PET scan that was completed on October 06, 2022. Right iliac crest biopsy demonstrated metastatic carcinoma consistent with patient's known breast carcinoma. ER 90% positive, PR 90% positive, Ki67 10%, HER2 negative.    10/10/2022 Treatment Plan Change   Faslodex  beginning 10/10/2022; Ibrance  beginning 12/20/2022; Zometa  every 12 weeks 11/07/2022    10/23/2022 - 11/03/2022 Radiation Therapy   Palliative Radiation 10/23/2022-11/03/2022:  left chest wall, lower T spine, and right proximal hip/pelvis were each treated to 30 GY in 10 fractions.      CURRENT THERAPY: Fulvestrant , Zometa , Verzenio   INTERVAL HISTORY:  Discussed the use of AI scribe software for clinical note transcription with the patient, who gave verbal consent to proceed.  History of Present Illness Doris Smith "Doris Smith" is a 73 year old female with breast cancer who presents with difficulty breathing and nausea. She is now on verzenio , faslodex  and Zometa .   Doris Smith "Doris Smith" is a 73 year old female with metastatic breast cancer who presents with persistent nausea, fatigue, and concerns about disease progression.  She experiences persistent nausea and fatigue, feeling 'sick to my stomach' and 'very tired.' These symptoms have been ongoing since her last visit and impact her daily life, although she remains active. No current diarrhea, but she is concerned about potential gastrointestinal side effects from  medication changes.  She has a history of metastatic breast cancer with liver and bone metastases. Recent PET scan shows mixed response with improvement in some bone lesions but new liver metastases. She is concerned about the progression of her liver disease.  She is currently taking Verzenio , 50 mg twice daily, for her cancer treatment. She has previously been on Ibrance  and letrozole, which were discontinued due to low blood counts. She also receives Faslodex  injections.   She experiences shortness of breath, not significantly changed compared to 2 weeks ago. Her hemoglobin level is 9. She has a large hiatal hernia, which may contribute to her nausea and breathing difficulties.  She is actively engaged in her care and wants to continue treatment despite the challenges. She is proud of her ability to remain physically active, stating, 'I'm doing so good on that.'   Patient Active Problem List   Diagnosis Date Noted   Malnutrition of moderate degree 08/17/2023   GAVE (gastric antral vascular ectasia) 08/17/2023   Gastric erosion 08/17/2023   Melena 08/16/2023   Anemia associated with acute blood loss 08/16/2023   Peripheral edema 12/29/2022   Restless leg syndrome 10/03/2021   Anxiety associated with depression 06/10/2020   Port-A-Cath in place 11/12/2018   Malignant neoplasm of upper-outer quadrant of right breast in female, estrogen receptor positive (HCC) 08/08/2018   Right knee pain 03/01/2015   Aortic atherosclerosis (HCC) 12/28/2014   Obesity (  BMI 30-39.9) 04/09/2014   Insomnia due to stress 04/09/2014   Atrial fibrillation (HCC) 10/01/2013   GERD (gastroesophageal reflux disease) 10/01/2013   Bipolar disorder (HCC) 09/27/2007   Essential hypertension 09/27/2007   ALLERGIC RHINITIS 09/27/2007   IRRITABLE BOWEL SYNDROME 09/27/2007   Arthropathy 09/27/2007   DEGENERATIVE DISC DISEASE, LUMBOSACRAL SPINE 09/27/2007    is allergic to iohexol, codeine, palonosetron , prednisone ,  sulfonamide derivatives, celecoxib, sulfa antibiotics, and vitamin b12.  MEDICAL HISTORY: Past Medical History:  Diagnosis Date   A-fib (HCC) 11/2012   PAF   Anxiety    takes Ativan    Asthma 04/07/2011   dx   Bipolar disorder (HCC)    Cancer (HCC)    Right breast   Depression    Early cataracts, bilateral    Fatty liver    Fibromyalgia    GERD (gastroesophageal reflux disease)    Irritable bowel syndrome    Mental disorder    dx bipolar   PONV (postoperative nausea and vomiting)    Sleep apnea 04/2011   does not wear CPAP   Tardive dyskinesia     SURGICAL HISTORY: Past Surgical History:  Procedure Laterality Date   ABDOMINAL HYSTERECTOMY     BIOPSY  08/17/2023   Procedure: BIOPSY;  Surgeon: Kenney Peacemaker, MD;  Location: [facility];  Service: Gastroenterology;;   COLONOSCOPY     DILATION AND CURETTAGE OF UTERUS  1981   abnormal pap   ESOPHAGOGASTRODUODENOSCOPY (EGD) WITH PROPOFOL  N/A 08/17/2023   Procedure: ESOPHAGOGASTRODUODENOSCOPY (EGD) WITH PROPOFOL ;  Surgeon: Kenney Peacemaker, MD;  Location: [facility];  Service: Gastroenterology;  Laterality: N/A;   HOT HEMOSTASIS N/A 08/17/2023   Procedure: HOT HEMOSTASIS (ARGON PLASMA COAGULATION/BICAP);  Surgeon: Kenney Peacemaker, MD;  Location: [facility];  Service: Gastroenterology;  Laterality: N/A;   IR IMAGING GUIDED PORT INSERTION  11/20/2023   IR THORACENTESIS ASP PLEURAL SPACE W/IMG GUIDE  09/13/2023   IR THORACENTESIS ASP PLEURAL SPACE W/IMG GUIDE  11/20/2023   KNEE ARTHROSCOPY Left 2007   x 2   LUMBAR LAMINECTOMY/DECOMPRESSION MICRODISCECTOMY  05/31/2011   Procedure: LUMBAR LAMINECTOMY/DECOMPRESSION MICRODISCECTOMY;  Surgeon: Loel Ring;  Location: [facility];  Service: Orthopedics;  Laterality: Left;  Decompression Lumbar four to five and  Lumbar five to Sacral one on Left  (X-Ray)   MASTECTOMY W/ SENTINEL NODE BIOPSY Right 09/16/2018   Procedure: RIGHT MASTECTOMY WITH RIGHT AXILLARY SENTINEL LYMPH NODE BIOPSY;  Surgeon:  Juanita Norlander, MD;  Location: [facility] OR;  Service: General;  Laterality: Right;   PARTIAL HYSTERECTOMY  1982   PORTACATH PLACEMENT Left 10/31/2018   Procedure: INSERTION PORT-A-CATH WITH ULTRASOUND;  Surgeon: Juanita Norlander, MD;  Location: [facility];  Service: General;  Laterality: Left;   TONSILLECTOMY     as child   TOTAL KNEE ARTHROPLASTY Left 12/18/2005    SOCIAL HISTORY: Social History   Socioeconomic History   Marital status: Married    Spouse name: Royston Cornea   Number of children: 2   Years of education: 12   Highest education level: Not on file  Occupational History   Occupation: Disabled  Tobacco Use   Smoking status: Never   Smokeless tobacco: Never  Vaping Use   Vaping status: Never Used  Substance and Sexual Activity   Alcohol use: No   Drug use: No   Sexual activity: Yes    Birth control/protection: Post-menopausal, Surgical  Other Topics Concern   Not on file  Social History Narrative   Tea daily.  Rarely has  caffeine    Social Drivers of Corporate investment banker Strain: Patient Declined (10/22/2023)   Overall Financial Resource Strain (CARDIA)    Difficulty of Paying Living Expenses: Patient declined  Food Insecurity: Patient Declined (10/22/2023)   Hunger Vital Sign    Worried About Running Out of Food in the Last Year: Patient declined    Ran Out of Food in the Last Year: Patient declined  Transportation Needs: No Transportation Needs (10/22/2023)   PRAPARE - Administrator, Civil Service (Medical): No    Lack of Transportation (Non-Medical): No  Physical Activity: Unknown (10/22/2023)   Exercise Vital Sign    Days of Exercise per Week: 0 days    Minutes of Exercise per Session: Not on file  Stress: Stress Concern Present (10/22/2023)   [employer] of Occupational Health - Occupational Stress Questionnaire    Feeling of Stress : To some extent  Social Connections: Unknown (10/22/2023)   Social Connection and Isolation Panel  [NHANES]    Frequency of Communication with Friends and Family: More than three times a week    Frequency of Social Gatherings with Friends and Family: Twice a week    Attends Religious Services: Patient declined    Database administrator or Organizations: No    Attends Banker Meetings: Never    Marital Status: Married  Catering manager Violence: Not At Risk (08/16/2023)   Humiliation, Afraid, Rape, and Kick questionnaire    Fear of Current or Ex-Partner: No    Emotionally Abused: No    Physically Abused: No    Sexually Abused: No    FAMILY HISTORY: Family History  Problem Relation Age of Onset   Anesthesia problems Mother    Heart disease Mother        CHF, atrial fib   Heart failure Mother    Anesthesia problems Sister    Colon polyps Father    Heart disease Father        "Fluid around the heart"   Hypertension Sister    Breast cancer Maternal Aunt    Colon cancer Maternal Aunt    Prostate cancer Neg Hx    Ovarian cancer Neg Hx     Review of Systems  Constitutional:  Negative for appetite change, chills, fatigue, fever and unexpected weight change.  HENT:   Negative for hearing loss, lump/mass and trouble swallowing.   Eyes:  Negative for eye problems and icterus.  Respiratory:  Negative for chest tightness, cough and shortness of breath.   Cardiovascular:  Negative for chest pain, leg swelling and palpitations.  Gastrointestinal:  Negative for abdominal distention, abdominal pain, constipation, diarrhea, nausea and vomiting.  Endocrine: Negative for hot flashes.  Genitourinary:  Negative for difficulty urinating.   Musculoskeletal:  Negative for arthralgias.  Skin:  Negative for itching and rash.  Neurological:  Negative for dizziness, extremity weakness, headaches and numbness.  Hematological:  Negative for adenopathy. Does not bruise/bleed easily.  Psychiatric/Behavioral:  Negative for depression. The patient is not nervous/anxious.       PHYSICAL  EXAMINATION  There were no vitals taken for this visit.   She appears to be in no acute distress Rest of the PE deferred in lieu of counseling.  LABORATORY DATA:  CBC    Component Value Date/Time   WBC 2.6 (L) 12/12/2023 1456   WBC 2.9 (L) 10/16/2023 1103   RBC 2.64 (L) 12/12/2023 1456   HGB 9.0 (L) 12/12/2023 1456   HGB 12.0 06/05/2022  1024   HCT 27.1 (L) 12/12/2023 1456   HCT 37.4 06/05/2022 1024   PLT 118 (L) 12/12/2023 1456   PLT 251 06/05/2022 1024   MCV 102.7 (H) 12/12/2023 1456   MCV 86 06/05/2022 1024   MCH 34.1 (H) 12/12/2023 1456   MCHC 33.2 12/12/2023 1456   RDW 14.8 12/12/2023 1456   RDW 15.5 (H) 06/05/2022 1024   LYMPHSABS 0.3 (L) 12/12/2023 1456   LYMPHSABS 0.9 06/05/2022 1024   MONOABS 0.2 12/12/2023 1456   EOSABS 0.1 12/12/2023 1456   EOSABS 0.1 06/05/2022 1024   BASOSABS 0.0 12/12/2023 1456   BASOSABS 0.1 06/05/2022 1024    CMP     Component Value Date/Time   NA 139 12/12/2023 1456   NA 145 (H) 06/05/2022 1024   K 3.8 12/12/2023 1456   CL 107 12/12/2023 1456   CO2 27 12/12/2023 1456   GLUCOSE 144 (H) 12/12/2023 1456   BUN 10 12/12/2023 1456   BUN 17 06/05/2022 1024   CREATININE 0.69 12/12/2023 1456   CALCIUM 8.8 (L) 12/12/2023 1456   PROT 5.9 (L) 12/12/2023 1456   PROT 6.6 06/05/2022 1024   ALBUMIN 3.4 (L) 12/12/2023 1456   ALBUMIN 4.0 06/05/2022 1024   AST 34 12/12/2023 1456   ALT 14 12/12/2023 1456   ALKPHOS 339 (H) 12/12/2023 1456   BILITOT 0.4 12/12/2023 1456   GFRNONAA >60 12/12/2023 1456   GFRAA 81 02/03/2020 1049   GFRAA >60 12/19/2018 1434       ASSESSMENT and THERAPY PLAN:   Malignant neoplasm of upper-outer quadrant of right breast in female, estrogen receptor positive (HCC) Assessment and Plan Assessment & Plan  Assessment and Plan Assessment & Plan Stage 4 Breast Cancer with bone, liver, gastrointestinal disease. progression despite Verzenio . PET scan: mixed response, bone improvement, new liver metastases.  Current treatment inadequate. Discussed options: increase Verzenio , switch to Orserdu, Truqap with faslodex , Xeloda. Risks.  - Engaged in shared decision-making, considering tolerance, cost. Prefers increasing Verzenio . - Increase Verzenio  dose to 100 mg twice daily. - Monitor for gastrointestinal side effects, particularly diarrhea. - Check tumor marker in one month to assess response. - Consider switching to Orserdu or Xeloda if Verzenio  dose increase is ineffective.  Pleural Effusion Pleural effusion on PET scan, likely cancer-related.  - Monitor dyspnea and consider fluid drainage if symptoms worsen.  Hiatal Hernia Large hiatal hernia on PET scan, unrelated to cancer. Potential contributor to gastrointestinal symptoms. Surgical intervention not advisable.  Anxiety Anxiety potentially contributing to nausea and overall well-being.  Goals of Care Desires to continue treatment despite progression, understanding seriousness. Willing to try new treatments to maintain quality of life.  Follow-up  - Check in within a couple of weeks to assess tolerance to increased Verzenio  dose.      All questions were answered. The patient knows to call the clinic with any problems, questions or concerns. We can certainly see the patient much sooner if necessary.  Total encounter time:30 minutes*in face-to-face visit time, chart review, lab review, care coordination, order entry, and documentation of the encounter time.  *Total Encounter Time as defined by the Centers for Medicare and Medicaid Services includes, in addition to the face-to-face time of a patient visit (documented in the note above) non-face-to-face time: obtaining and reviewing outside history, ordering and reviewing medications, tests or procedures, care coordination (communications with other health care professionals or caregivers) and documentation in the medical record.

## 2023-12-13 ENCOUNTER — Other Ambulatory Visit: Payer: Self-pay

## 2023-12-13 ENCOUNTER — Other Ambulatory Visit: Payer: Self-pay | Admitting: Pharmacist

## 2023-12-13 ENCOUNTER — Other Ambulatory Visit (HOSPITAL_COMMUNITY): Payer: Self-pay

## 2023-12-13 LAB — CANCER ANTIGEN 27.29: CA 27.29: 1227.8 U/mL — ABNORMAL HIGH (ref 0.0–38.6)

## 2023-12-13 MED ORDER — ABEMACICLIB 100 MG PO TABS
100.0000 mg | ORAL_TABLET | Freq: Two times a day (BID) | ORAL | 0 refills | Status: DC
  Filled 2023-12-13 – 2023-12-20 (×2): qty 56, 28d supply, fill #0

## 2023-12-17 ENCOUNTER — Telehealth: Payer: Self-pay | Admitting: *Deleted

## 2023-12-17 ENCOUNTER — Other Ambulatory Visit: Payer: Self-pay

## 2023-12-17 ENCOUNTER — Other Ambulatory Visit: Payer: Self-pay | Admitting: Hematology and Oncology

## 2023-12-17 MED ORDER — PANTOPRAZOLE SODIUM 40 MG PO TBEC: 40.0000 mg | DELAYED_RELEASE_TABLET | Freq: Two times a day (BID) | ORAL | 2 refills | Status: DC

## 2023-12-17 NOTE — Telephone Encounter (Signed)
 Hospitalist gave her RX for Protonix  40mg /BID for reflux when d/c' d in February from inpt oncology. She said she's been out a few weeks and can tell the difference and asked if Dr. Arno Bibles would prescribe it. Please use [facility] if she prescribes it.  Dr. Arno Bibles given message and responded with verbal order to provide refill Contacted patient to inform that rx sent to [place] pharmacy. She verbalized understanding

## 2023-12-18 NOTE — Addendum Note (Signed)
 Encounter addended by: Bernabe Brew, RN on: 12/18/2023 11:14 AM  Actions taken: Imaging Exam ended

## 2023-12-19 ENCOUNTER — Other Ambulatory Visit: Payer: Self-pay

## 2023-12-19 ENCOUNTER — Telehealth: Payer: Self-pay | Admitting: *Deleted

## 2023-12-19 NOTE — Telephone Encounter (Signed)
 This RN spoke with pt per her call.  She is concerned due to increase in Verzenio  dose to 2 tab bid she will run out of the medication before her visit next Thursday 6/19.  She was concerned that getting a full refill may not be good as what if she wants to change something at my visit next week  This RN asked how she was doing on the higher dose- with [name] stating she feels good.  She had 1 x vomiting.  No changes in her shortness of breath.  She is able to continue her exercises .  She does state she realizes she gets panicky easily which then causes increased shortness of breath.  She verified she is still taking the olanzapine .  This RN informed her above would be reviewed with MD and Autry Legions I for need for refill of if we can have samples med to hold her over until visit.  This note will be forwarded to providers for review and recommendation for nurse to return call to patient.

## 2023-12-20 ENCOUNTER — Other Ambulatory Visit (HOSPITAL_COMMUNITY): Payer: Self-pay

## 2023-12-20 ENCOUNTER — Other Ambulatory Visit: Payer: Self-pay

## 2023-12-20 NOTE — Progress Notes (Signed)
 Clinical Intervention Note  Clinical Intervention Notes: Patient has been taking 2 tablets of 50mg  BID since recent appointment with Dr. Arno Bibles on 12/12/23 per provider recommendation to increase to 100mg  BID. Rx received by pharmacy for Verzenio  100mg  BID on 6/5. We were unsuccessful in reaching patient and attempted to reach her sister yesterday who was unsure of the changes. I spoke with patient in detail today to confirm her understanding of dose increase and offer delivery of her new prescription on file. Patient is in understanding of how to take the new prescription and states that she has 24 tablets of the 50mg  left which will last her 6 days. She agrees to accept delivery for the 100mg  tablets on Monday 6/16 so that she does not have any gaps in treatment. Patient is in full understanding and agreement to this and will contact the pharmacy back with any further questions or concerns.   Clinical Intervention Outcomes: Improved therapy adherence   Rena Carnes Specialty Pharmacist

## 2023-12-20 NOTE — Progress Notes (Signed)
 Specialty Pharmacy Refill Coordination Note  Doris Smith is a 73 y.o. female contacted today regarding refills of specialty medication(s) Abemaciclib  (VERZENIO )   Patient requested Delivery   Delivery date: 12/24/23   Verified address: [address] [place], [place] [address]   Medication will be filled on 12/21/23.

## 2023-12-21 ENCOUNTER — Other Ambulatory Visit: Payer: Self-pay

## 2023-12-24 ENCOUNTER — Other Ambulatory Visit (HOSPITAL_COMMUNITY): Payer: Self-pay

## 2023-12-24 ENCOUNTER — Other Ambulatory Visit: Payer: Self-pay | Admitting: Hematology and Oncology

## 2023-12-24 ENCOUNTER — Other Ambulatory Visit: Payer: Self-pay | Admitting: Nurse Practitioner

## 2023-12-24 DIAGNOSIS — Z170 Estrogen receptor positive status [ER+]: Secondary | ICD-10-CM

## 2023-12-24 DIAGNOSIS — G893 Neoplasm related pain (acute) (chronic): Secondary | ICD-10-CM

## 2023-12-24 DIAGNOSIS — Z515 Encounter for palliative care: Secondary | ICD-10-CM

## 2023-12-24 MED ORDER — OXYCODONE HCL ER 15 MG PO T12A
15.0000 mg | EXTENDED_RELEASE_TABLET | Freq: Two times a day (BID) | ORAL | 0 refills | Status: DC
  Filled 2023-12-24: qty 60, 30d supply, fill #0

## 2023-12-25 ENCOUNTER — Telehealth: Payer: Self-pay

## 2023-12-25 ENCOUNTER — Other Ambulatory Visit (HOSPITAL_COMMUNITY): Payer: Self-pay

## 2023-12-25 NOTE — Telephone Encounter (Signed)
 Verbally confirmed appts for 6/19

## 2023-12-26 ENCOUNTER — Telehealth: Payer: Self-pay | Admitting: *Deleted

## 2023-12-26 ENCOUNTER — Other Ambulatory Visit: Payer: Self-pay | Admitting: *Deleted

## 2023-12-26 DIAGNOSIS — J910 Malignant pleural effusion: Secondary | ICD-10-CM

## 2023-12-26 DIAGNOSIS — C50411 Malignant neoplasm of upper-outer quadrant of right female breast: Secondary | ICD-10-CM

## 2023-12-26 NOTE — Telephone Encounter (Signed)
 Pt called with c/o SOB. She doesn't know if it's anxiety or if she needs another thoracentesis. She requested a chest X-ray for Dr. Arno Bibles to evaluated. Advised pt that order has been put in place and advised to come to [facility] to have chest x-ray as soon as possible. Pt verbalized understanding.

## 2023-12-27 ENCOUNTER — Inpatient Hospital Stay (HOSPITAL_BASED_OUTPATIENT_CLINIC_OR_DEPARTMENT_OTHER): Admitting: Hematology and Oncology

## 2023-12-27 ENCOUNTER — Inpatient Hospital Stay

## 2023-12-27 ENCOUNTER — Ambulatory Visit (HOSPITAL_COMMUNITY)
Admission: RE | Admit: 2023-12-27 | Discharge: 2023-12-27 | Disposition: A | Discharge status: Home / Self Care | Source: Ambulatory Visit | Attending: Hematology and Oncology | Admitting: Hematology and Oncology

## 2023-12-27 VITALS — BP 99/84 | HR 81 | Temp 98.4°F | Resp 17 | Wt 146.8 lb

## 2023-12-27 DIAGNOSIS — C50411 Malignant neoplasm of upper-outer quadrant of right female breast: Secondary | ICD-10-CM | POA: Diagnosis not present

## 2023-12-27 DIAGNOSIS — J910 Malignant pleural effusion: Secondary | ICD-10-CM

## 2023-12-27 DIAGNOSIS — Z170 Estrogen receptor positive status [ER+]: Secondary | ICD-10-CM | POA: Diagnosis not present

## 2023-12-27 DIAGNOSIS — J90 Pleural effusion, not elsewhere classified: Secondary | ICD-10-CM | POA: Diagnosis not present

## 2023-12-27 DIAGNOSIS — Z9011 Acquired absence of right breast and nipple: Secondary | ICD-10-CM | POA: Diagnosis not present

## 2023-12-27 DIAGNOSIS — J9811 Atelectasis: Secondary | ICD-10-CM | POA: Diagnosis not present

## 2023-12-27 DIAGNOSIS — Z923 Personal history of irradiation: Secondary | ICD-10-CM | POA: Diagnosis not present

## 2023-12-27 DIAGNOSIS — R0602 Shortness of breath: Secondary | ICD-10-CM | POA: Diagnosis not present

## 2023-12-27 DIAGNOSIS — C787 Secondary malignant neoplasm of liver and intrahepatic bile duct: Secondary | ICD-10-CM | POA: Diagnosis not present

## 2023-12-27 DIAGNOSIS — Z5111 Encounter for antineoplastic chemotherapy: Secondary | ICD-10-CM | POA: Diagnosis not present

## 2023-12-27 DIAGNOSIS — Z9221 Personal history of antineoplastic chemotherapy: Secondary | ICD-10-CM | POA: Diagnosis not present

## 2023-12-27 DIAGNOSIS — C50919 Malignant neoplasm of unspecified site of unspecified female breast: Secondary | ICD-10-CM | POA: Diagnosis not present

## 2023-12-27 DIAGNOSIS — Z1721 Progesterone receptor positive status: Secondary | ICD-10-CM | POA: Diagnosis not present

## 2023-12-27 DIAGNOSIS — C7951 Secondary malignant neoplasm of bone: Secondary | ICD-10-CM | POA: Diagnosis not present

## 2023-12-27 DIAGNOSIS — Z1732 Human epidermal growth factor receptor 2 negative status: Secondary | ICD-10-CM | POA: Diagnosis not present

## 2023-12-27 LAB — CBC WITH DIFFERENTIAL (CANCER CENTER ONLY)
Abs Immature Granulocytes: 0.01 10*3/uL (ref 0.00–0.07)
Basophils Absolute: 0.1 10*3/uL (ref 0.0–0.1)
Basophils Relative: 3 %
Eosinophils Absolute: 0 10*3/uL (ref 0.0–0.5)
Eosinophils Relative: 1 %
HCT: 26.7 % — ABNORMAL LOW (ref 36.0–46.0)
Hemoglobin: 8.9 g/dL — ABNORMAL LOW (ref 12.0–15.0)
Immature Granulocytes: 0 %
Lymphocytes Relative: 13 %
Lymphs Abs: 0.3 10*3/uL — ABNORMAL LOW (ref 0.7–4.0)
MCH: 34 pg (ref 26.0–34.0)
MCHC: 33.3 g/dL (ref 30.0–36.0)
MCV: 101.9 fL — ABNORMAL HIGH (ref 80.0–100.0)
Monocytes Absolute: 0.2 10*3/uL (ref 0.1–1.0)
Monocytes Relative: 6 %
Neutro Abs: 1.9 10*3/uL (ref 1.7–7.7)
Neutrophils Relative %: 77 %
Platelet Count: 92 10*3/uL — ABNORMAL LOW (ref 150–400)
RBC: 2.62 MIL/uL — ABNORMAL LOW (ref 3.87–5.11)
RDW: 13.5 % (ref 11.5–15.5)
Smear Review: NORMAL
WBC Count: 2.4 10*3/uL — ABNORMAL LOW (ref 4.0–10.5)
nRBC: 0 % (ref 0.0–0.2)

## 2023-12-27 LAB — CMP (CANCER CENTER ONLY)
ALT: 16 U/L (ref 0–44)
AST: 34 U/L (ref 15–41)
Albumin: 3.5 g/dL (ref 3.5–5.0)
Alkaline Phosphatase: 350 U/L — ABNORMAL HIGH (ref 38–126)
Anion gap: 6 (ref 5–15)
BUN: 13 mg/dL (ref 8–23)
CO2: 27 mmol/L (ref 22–32)
Calcium: 9 mg/dL (ref 8.9–10.3)
Chloride: 108 mmol/L (ref 98–111)
Creatinine: 0.71 mg/dL (ref 0.44–1.00)
GFR, Estimated: >60 mL/min (ref >60)
Glucose, Bld: 109 mg/dL — ABNORMAL HIGH (ref 70–99)
Potassium: 4 mmol/L (ref 3.5–5.1)
Sodium: 141 mmol/L (ref 135–145)
Total Bilirubin: 0.6 mg/dL (ref 0.0–1.2)
Total Protein: 5.8 g/dL — ABNORMAL LOW (ref 6.5–8.1)

## 2023-12-27 LAB — WBC/PLT IN CITRATE

## 2023-12-27 MED ORDER — OLANZAPINE 5 MG PO TABS: 5.0000 mg | ORAL_TABLET | Freq: Every day | ORAL | 1 refills | Status: DC

## 2023-12-27 NOTE — Addendum Note (Signed)
 Encounter addended by: Karolynn Pack on: 12/27/2023 12:35 PM  Actions taken: Imaging Exam ended

## 2023-12-27 NOTE — Progress Notes (Unsigned)
 [facility] Cancer Follow up:    Doris Feller, FNP [address] [place] [address]   DIAGNOSIS:  Cancer Staging  Malignant neoplasm of upper-outer quadrant of right breast in female, estrogen receptor positive (HCC) Staging form: Breast, AJCC 8th Edition - Pathologic: Stage IB (pT3, pN55mi, cM0, G2, ER+, PR+, HER2-) - Signed by Percival Brace, NP on 10/02/2018 Multigene prognostic tests performed: MammaPrint Histologic grading system: 3 grade system - Clinical stage from 12/28/2022: Stage IV (rcT2, cN0, cM1, G3, ER+, PR+, HER2-) - Signed by Murleen Arms, MD on 12/28/2022 Stage prefix: Recurrence Histologic grading system: 3 grade system Laterality: Right Staged by: Pathologist and managing physician Stage used in treatment planning: Yes National guidelines used in treatment planning: Yes Type of national guideline used in treatment planning: NCCN   SUMMARY OF ONCOLOGIC HISTORY: Oncology History  Malignant neoplasm of upper-outer quadrant of right breast in female, estrogen receptor positive (HCC)  08/08/2018 Initial Diagnosis   status post right breast biopsy 08/02/2018 for a clinical T3 N0, stage IIA invasive lobular carcinoma, grade 2, estrogen receptor strongly positive, progesterone receptor 1% positive, with no HER-2 amplification and an MIB-1 of 1%.             (a) CT scan of the head and chest, without contrast 08/23/2018 showed nonspecific 0.4 cm left lower lobe pulmonary nodule, no definitive metastatic disease             (b) bone scan 08/23/2018 shows multiple spinal areas of abnormal uptake, but             (c) total spinal MRI 09/03/2018 finds bone scan findings to be secondary to degenerative disease, no evidence of metastatic disease.   08/2018 - 10/2022 Anti-estrogen oral therapy   Anastrozole ; discontinued 10/14/2018 in preparation for chemotherapy, resumed October 2020   09/16/2018 Surgery   Right mastectomy [place])  [phone]): Invasive Lobular Carcinoma, 10.5 cm, grade 2, negative margins. 1 of 5 lymph nodes positive for carcinoma.   09/16/2018 Miscellaneous   MammaPrint high risk suggests a 5-year metastasis free survival of 93% with chemotherapy, with an absolute chemotherapy benefit in the greater than 12% range    09/16/2018 Miscellaneous   Caris testing on mastectomy sample (09/16/2018) showed stable MSI and proficient mismatch repair status, with a low mutational burden; BRCA 1 and 2 were not mutated, PI K3 was not mutated, ER B B2 was not mutated, and AKT 1 was not mutated. 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 Administration: 0.25 mg (11/05/2018), 0.25 mg (11/26/2018), 0.25 mg (12/17/2018), 0.25 mg (01/07/2019), 0.25 mg (01/28/2019), 0.25 mg (02/18/2019), 0.25 mg (03/11/2019), 0.25 mg (04/02/2019)  methotrexate  (PF) chemo injection 84 mg, 39.8 mg/m2 = 84.5 mg, Intravenous,  Once, 5 of 5 cycles. Administration: 84 mg (11/05/2018), 84 mg (11/26/2018), 84 mg (12/17/2018), 84 mg (03/11/2019), 84 mg (04/02/2019)  pegfilgrastim -cbqv (UDENYCA ) injection 6 mg, 6 mg, Subcutaneous, Once, 6 of 6 cycles. Administration: 6 mg (12/19/2018)  cyclophosphamide  (CYTOXAN ) 1,260 mg in sodium chloride  0.9 % 250 mL chemo infusion, 600 mg/m2 = 1,260 mg, Intravenous,  Once, 8 of 8 cycle. Administration: 1,260 mg (11/05/2018), 1,260 mg (11/26/2018), 1,260 mg (12/17/2018), 1,260 mg (01/07/2019), 1,260 mg (01/28/2019), 1,260 mg (02/18/2019), 1,260 mg (03/11/2019), 1,260 mg (04/02/2019)  fluorouracil  (ADRUCIL ) chemo injection 1,250  mg, 600 mg/m2 = 1,250 mg, Intravenous,  Once, 8 of 8 cycles. Administration: 1,250 mg (11/05/2018), 1,250 mg (11/26/2018), 1,250 mg (12/17/2018), 1,250 mg (01/07/2019), 1,250 mg (01/28/2019), 1,250 mg (02/18/2019), 1,250 mg (03/11/2019), 1,250 mg  (04/02/2019).    12/31/2018 - 02/19/2019 Radiation Therapy   The patient initially received a dose of 50.4 Gy in 28 fractions to the chest wall and supraclavicular region. This was delivered using a 3-D conformal, 4 field technique. The patient then received a boost to the mastectomy scar. This delivered an additional 10 Gy in 5 fractions using an en face electron field. The total dose was 60.4 Gy.   09/12/2022 Imaging   Bone scan on 09/12/2022 shows uptake in ribs, manubrium. Widespread osseous metastasis confirmed on PET scan that was completed on October 06, 2022. Right iliac crest biopsy demonstrated metastatic carcinoma consistent with patient's known breast carcinoma. ER 90% positive, PR 90% positive, Ki67 10%, HER2 negative.    10/10/2022 Treatment Plan Change   Faslodex  beginning 10/10/2022; Ibrance  beginning 12/20/2022; Zometa  every 12 weeks 11/07/2022    10/23/2022 - 11/03/2022 Radiation Therapy   Palliative Radiation 10/23/2022-11/03/2022:  left chest wall, lower T spine, and right proximal hip/pelvis were each treated to 30 GY in 10 fractions.      CURRENT THERAPY: Fulvestrant , Zometa , Verzenio   INTERVAL HISTORY:  Patient is here with her husband. She is tolerating verzenio  reasonably well, 100 mg PO BID. She complains of severe nausea, had 2 episodes of vomiting last week, she doesn't think this is related to increased dose of verzenio . She also complains of some SOB. She is in no resp distress. Once again, anxiety continues to be an issue. She didn't complain of any pain today. Rest of the pertinent 10 point ROS reviewed and neg.  Patient Active Problem List   Diagnosis Date Noted   Malnutrition of moderate degree 08/17/2023   GAVE (gastric antral vascular ectasia) 08/17/2023   Gastric erosion 08/17/2023   Melena 08/16/2023   Anemia associated with acute blood loss 08/16/2023   Peripheral edema 12/29/2022   Restless leg syndrome 10/03/2021   Anxiety associated with depression 06/10/2020    Port-A-Cath in place 11/12/2018   Malignant neoplasm of upper-outer quadrant of right breast in female, estrogen receptor positive (HCC) 08/08/2018   Right knee pain 03/01/2015   Aortic atherosclerosis (HCC) 12/28/2014   Obesity (BMI 30-39.9) 04/09/2014   Insomnia due to stress 04/09/2014   Atrial fibrillation (HCC) 10/01/2013   GERD (gastroesophageal reflux disease) 10/01/2013   Bipolar disorder (HCC) 09/27/2007   Essential hypertension 09/27/2007   ALLERGIC RHINITIS 09/27/2007   IRRITABLE BOWEL SYNDROME 09/27/2007   Arthropathy 09/27/2007   DEGENERATIVE DISC DISEASE, LUMBOSACRAL SPINE 09/27/2007    is allergic to iohexol, codeine, palonosetron , prednisone , sulfonamide derivatives, celecoxib, sulfa antibiotics, and vitamin b12.  MEDICAL HISTORY: Past Medical History:  Diagnosis Date   A-fib (HCC) 11/2012   PAF   Anxiety    takes Ativan    Asthma 04/07/2011   dx   Bipolar disorder (HCC)    Cancer (HCC)    Right breast   Depression    Early cataracts, bilateral    Fatty liver    Fibromyalgia    GERD (gastroesophageal reflux disease)    Irritable bowel syndrome    Mental disorder    dx bipolar   PONV (postoperative nausea and vomiting)    Sleep apnea 04/2011   does not wear CPAP   Tardive dyskinesia     SURGICAL HISTORY: Past Surgical History:  Procedure Laterality Date   ABDOMINAL HYSTERECTOMY     BIOPSY  08/17/2023   Procedure: BIOPSY;  Surgeon: Kenney Peacemaker, MD;  Location: [facility];  Service: Gastroenterology;;   COLONOSCOPY     DILATION AND CURETTAGE OF UTERUS  1981   abnormal pap   ESOPHAGOGASTRODUODENOSCOPY (EGD) WITH PROPOFOL  N/A 08/17/2023   Procedure: ESOPHAGOGASTRODUODENOSCOPY (EGD) WITH PROPOFOL ;  Surgeon: Kenney Peacemaker, MD;  Location: [facility];  Service: Gastroenterology;  Laterality: N/A;   HOT HEMOSTASIS N/A 08/17/2023   Procedure: HOT HEMOSTASIS (ARGON PLASMA COAGULATION/BICAP);  Surgeon: Kenney Peacemaker, MD;  Location: [facility];   Service: Gastroenterology;  Laterality: N/A;   IR IMAGING GUIDED PORT INSERTION  11/20/2023   IR THORACENTESIS ASP PLEURAL SPACE W/IMG GUIDE  09/13/2023   IR THORACENTESIS ASP PLEURAL SPACE W/IMG GUIDE  11/20/2023   KNEE ARTHROSCOPY Left 2007   x 2   LUMBAR LAMINECTOMY/DECOMPRESSION MICRODISCECTOMY  05/31/2011   Procedure: LUMBAR LAMINECTOMY/DECOMPRESSION MICRODISCECTOMY;  Surgeon: Loel Ring;  Location: [facility];  Service: Orthopedics;  Laterality: Left;  Decompression Lumbar four to five and  Lumbar five to Sacral one on Left  (X-Ray)   MASTECTOMY W/ SENTINEL NODE BIOPSY Right 09/16/2018   Procedure: RIGHT MASTECTOMY WITH RIGHT AXILLARY SENTINEL LYMPH NODE BIOPSY;  Surgeon: Juanita Norlander, MD;  Location: [facility] OR;  Service: General;  Laterality: Right;   PARTIAL HYSTERECTOMY  1982   PORTACATH PLACEMENT Left 10/31/2018   Procedure: INSERTION PORT-A-CATH WITH ULTRASOUND;  Surgeon: Juanita Norlander, MD;  Location: [facility];  Service: General;  Laterality: Left;   TONSILLECTOMY     as child   TOTAL KNEE ARTHROPLASTY Left 12/18/2005    SOCIAL HISTORY: Social History   Socioeconomic History   Marital status: Married    Spouse name: Royston Cornea   Number of children: 2   Years of education: 12   Highest education level: Not on file  Occupational History   Occupation: Disabled  Tobacco Use   Smoking status: Never   Smokeless tobacco: Never  Vaping Use   Vaping status: Never Used  Substance and Sexual Activity   Alcohol use: No   Drug use: No   Sexual activity: Yes    Birth control/protection: Post-menopausal, Surgical  Other Topics Concern   Not on file  Social History Narrative   Tea daily.  Rarely has caffeine    Social Drivers of Corporate investment banker Strain: Patient Declined (10/22/2023)   Overall Financial Resource Strain (CARDIA)    Difficulty of Paying Living Expenses: Patient declined  Food Insecurity: Patient Declined (10/22/2023)   Hunger Vital Sign     Worried About Running Out of Food in the Last Year: Patient declined    Ran Out of Food in the Last Year: Patient declined  Transportation Needs: No Transportation Needs (10/22/2023)   PRAPARE - Administrator, Civil Service (Medical): No    Lack of Transportation (Non-Medical): No  Physical Activity: Unknown (10/22/2023)   Exercise Vital Sign    Days of Exercise per Week: 0 days    Minutes of Exercise per Session: Not on file  Stress: Stress Concern Present (10/22/2023)   [employer] of Occupational Health - Occupational Stress Questionnaire    Feeling of Stress : To some extent  Social Connections: Unknown (10/22/2023)   Social Connection and Isolation Panel    Frequency of Communication with Friends and Family: More than three times a week    Frequency of Social Gatherings with Friends and  Family: Twice a week    Attends Religious Services: Patient declined    Active Member of Clubs or Organizations: No    Attends Banker Meetings: Never    Marital Status: Married  Catering manager Violence: Not At Risk (08/16/2023)   Humiliation, Afraid, Rape, and Kick questionnaire    Fear of Current or Ex-Partner: No    Emotionally Abused: No    Physically Abused: No    Sexually Abused: No    FAMILY HISTORY: Family History  Problem Relation Age of Onset   Anesthesia problems Mother    Heart disease Mother        CHF, atrial fib   Heart failure Mother    Anesthesia problems Sister    Colon polyps Father    Heart disease Father        Fluid around the heart   Hypertension Sister    Breast cancer Maternal Aunt    Colon cancer Maternal Aunt    Prostate cancer Neg Hx    Ovarian cancer Neg Hx     Review of Systems  Constitutional:  Negative for appetite change, chills, fatigue, fever and unexpected weight change.  HENT:   Negative for hearing loss, lump/mass and trouble swallowing.   Eyes:  Negative for eye problems and icterus.  Respiratory:  Negative for  chest tightness, cough and shortness of breath.   Cardiovascular:  Negative for chest pain, leg swelling and palpitations.  Gastrointestinal:  Negative for abdominal distention, abdominal pain, constipation, diarrhea, nausea and vomiting.  Endocrine: Negative for hot flashes.  Genitourinary:  Negative for difficulty urinating.   Musculoskeletal:  Negative for arthralgias.  Skin:  Negative for itching and rash.  Neurological:  Negative for dizziness, extremity weakness, headaches and numbness.  Hematological:  Negative for adenopathy. Does not bruise/bleed easily.  Psychiatric/Behavioral:  Negative for depression. The patient is not nervous/anxious.       PHYSICAL EXAMINATION  BP 99/84 (BP Location: Left Arm, Patient Position: Sitting)   Pulse 81   Temp 98.4 F (36.9 C) (Temporal)   Resp 17   Wt 146 lb 12.8 oz (66.6 kg)   SpO2 98%   BMI 25.20 kg/m   She appears to be in no acute distress Bilateral bases with poor entry. No large effusion on exam No significant lip smacking today  LABORATORY DATA:  CBC    Component Value Date/Time   WBC 2.4 (L) 12/27/2023 0938   WBC 2.9 (L) 10/16/2023 1103   RBC 2.62 (L) 12/27/2023 0938   HGB 8.9 (L) 12/27/2023 0938   HGB 12.0 06/05/2022 1024   HCT 26.7 (L) 12/27/2023 0938   HCT 37.4 06/05/2022 1024   PLT 92 (L) 12/27/2023 0938   PLT 251 06/05/2022 1024   MCV 101.9 (H) 12/27/2023 0938   MCV 86 06/05/2022 1024   MCH 34.0 12/27/2023 0938   MCHC 33.3 12/27/2023 0938   RDW 13.5 12/27/2023 0938   RDW 15.5 (H) 06/05/2022 1024   LYMPHSABS 0.3 (L) 12/27/2023 0938   LYMPHSABS 0.9 06/05/2022 1024   MONOABS 0.2 12/27/2023 0938   EOSABS 0.0 12/27/2023 0938   EOSABS 0.1 06/05/2022 1024   BASOSABS 0.1 12/27/2023 0938   BASOSABS 0.1 06/05/2022 1024    CMP     Component Value Date/Time   NA 141 12/27/2023 0938   NA 145 (H) 06/05/2022 1024   K 4.0 12/27/2023 0938   CL 108 12/27/2023 0938   CO2 27 12/27/2023 0938   GLUCOSE 109 (H)  12/27/2023  0938   BUN 13 12/27/2023 0938   BUN 17 06/05/2022 1024   CREATININE 0.71 12/27/2023 0938   CALCIUM 9.0 12/27/2023 0938   PROT 5.8 (L) 12/27/2023 0938   PROT 6.6 06/05/2022 1024   ALBUMIN 3.5 12/27/2023 0938   ALBUMIN 4.0 06/05/2022 1024   AST 34 12/27/2023 0938   ALT 16 12/27/2023 0938   ALKPHOS 350 (H) 12/27/2023 0938   BILITOT 0.6 12/27/2023 0938   GFRNONAA >60 12/27/2023 0938   GFRAA 81 02/03/2020 1049   GFRAA >60 12/19/2018 1434       ASSESSMENT and THERAPY PLAN:   No problem-specific Assessment & Plan notes found for this encounter.   All questions were answered. The patient knows to call the clinic with any problems, questions or concerns. We can certainly see the patient much sooner if necessary.  Total encounter time:30 minutes*in face-to-face visit time, chart review, lab review, care coordination, order entry, and documentation of the encounter time.  *Total Encounter Time as defined by the Centers for Medicare and Medicaid Services includes, in addition to the face-to-face time of a patient visit (documented in the note above) non-face-to-face time: obtaining and reviewing outside history, ordering and reviewing medications, tests or procedures, care coordination (communications with other health care professionals or caregivers) and documentation in the medical record.

## 2023-12-27 NOTE — Progress Notes (Unsigned)
 [facility] Cancer Follow up:    Doris Feller, FNP [address] [place] [address]   DIAGNOSIS:  Cancer Staging  Malignant neoplasm of upper-outer quadrant of right breast in female, estrogen receptor positive (HCC) Staging form: Breast, AJCC 8th Edition - Pathologic: Stage IB (pT3, pN22mi, cM0, G2, ER+, PR+, HER2-) - Signed by Percival Brace, NP on 10/02/2018 Multigene prognostic tests performed: MammaPrint Histologic grading system: 3 grade system - Clinical stage from 12/28/2022: Stage IV (rcT2, cN0, cM1, G3, ER+, PR+, HER2-) - Signed by Murleen Arms, MD on 12/28/2022 Stage prefix: Recurrence Histologic grading system: 3 grade system Laterality: Right Staged by: Pathologist and managing physician Stage used in treatment planning: Yes National guidelines used in treatment planning: Yes Type of national guideline used in treatment planning: NCCN   SUMMARY OF ONCOLOGIC HISTORY: Oncology History  Malignant neoplasm of upper-outer quadrant of right breast in female, estrogen receptor positive (HCC)  08/08/2018 Initial Diagnosis   status post right breast biopsy 08/02/2018 for a clinical T3 N0, stage IIA invasive lobular carcinoma, grade 2, estrogen receptor strongly positive, progesterone receptor 1% positive, with no HER-2 amplification and an MIB-1 of 1%.             (a) CT scan of the head and chest, without contrast 08/23/2018 showed nonspecific 0.4 cm left lower lobe pulmonary nodule, no definitive metastatic disease             (b) bone scan 08/23/2018 shows multiple spinal areas of abnormal uptake, but             (c) total spinal MRI 09/03/2018 finds bone scan findings to be secondary to degenerative disease, no evidence of metastatic disease.   08/2018 - 10/2022 Anti-estrogen oral therapy   Anastrozole ; discontinued 10/14/2018 in preparation for chemotherapy, resumed October 2020   09/16/2018 Surgery   Right mastectomy [place] [facility])  [phone]): Invasive Lobular Carcinoma, 10.5 cm, grade 2, negative margins. 1 of 5 lymph nodes positive for carcinoma.   09/16/2018 Miscellaneous   MammaPrint high risk suggests a 5-year metastasis free survival of 93% with chemotherapy, with an absolute chemotherapy benefit in the greater than 12% range    09/16/2018 Miscellaneous   Caris testing on mastectomy sample (09/16/2018) showed stable MSI and proficient mismatch repair status, with a low mutational burden; BRCA 1 and 2 were not mutated, PI K3 was not mutated, ER B B2 was not mutated, and AKT 1 was not mutated. 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 Administration: 0.25 mg (11/05/2018), 0.25 mg (11/26/2018), 0.25 mg (12/17/2018), 0.25 mg (01/07/2019), 0.25 mg (01/28/2019), 0.25 mg (02/18/2019), 0.25 mg (03/11/2019), 0.25 mg (04/02/2019)  methotrexate  (PF) chemo injection 84 mg, 39.8 mg/m2 = 84.5 mg, Intravenous,  Once, 5 of 5 cycles. Administration: 84 mg (11/05/2018), 84 mg (11/26/2018), 84 mg (12/17/2018), 84 mg (03/11/2019), 84 mg (04/02/2019)  pegfilgrastim -cbqv (UDENYCA ) injection 6 mg, 6 mg, Subcutaneous, Once, 6 of 6 cycles. Administration: 6 mg (12/19/2018)  cyclophosphamide  (CYTOXAN ) 1,260 mg in sodium chloride  0.9 % 250 mL chemo infusion, 600 mg/m2 = 1,260 mg, Intravenous,  Once, 8 of 8 cycle. Administration: 1,260 mg (11/05/2018), 1,260 mg (11/26/2018), 1,260 mg (12/17/2018), 1,260 mg (01/07/2019), 1,260 mg (01/28/2019), 1,260 mg (02/18/2019), 1,260 mg (03/11/2019), 1,260 mg (04/02/2019)  fluorouracil  (ADRUCIL ) chemo injection 1,250  mg, 600 mg/m2 = 1,250 mg, Intravenous,  Once, 8 of 8 cycles. Administration: 1,250 mg (11/05/2018), 1,250 mg (11/26/2018), 1,250 mg (12/17/2018), 1,250 mg (01/07/2019), 1,250 mg (01/28/2019), 1,250 mg (02/18/2019), 1,250 mg (03/11/2019), 1,250 mg  (04/02/2019).    12/31/2018 - 02/19/2019 Radiation Therapy   The patient initially received a dose of 50.4 Gy in 28 fractions to the chest wall and supraclavicular region. This was delivered using a 3-D conformal, 4 field technique. The patient then received a boost to the mastectomy scar. This delivered an additional 10 Gy in 5 fractions using an en face electron field. The total dose was 60.4 Gy.   09/12/2022 Imaging   Bone scan on 09/12/2022 shows uptake in ribs, manubrium. Widespread osseous metastasis confirmed on PET scan that was completed on October 06, 2022. Right iliac crest biopsy demonstrated metastatic carcinoma consistent with patient's known breast carcinoma. ER 90% positive, PR 90% positive, Ki67 10%, HER2 negative.    10/10/2022 Treatment Plan Change   Faslodex  beginning 10/10/2022; Ibrance  beginning 12/20/2022; Zometa  every 12 weeks 11/07/2022    10/23/2022 - 11/03/2022 Radiation Therapy   Palliative Radiation 10/23/2022-11/03/2022:  left chest wall, lower T spine, and right proximal hip/pelvis were each treated to 30 GY in 10 fractions.      CURRENT THERAPY: Fulvestrant , Zometa , Verzenio   INTERVAL HISTORY:  Discussed the use of AI scribe software for clinical note transcription with the patient, who gave verbal consent to proceed.  History of Present Illness Doris Smith is a 73 year old female with breast cancer who presents with difficulty breathing and nausea. She is now on verzenio , faslodex  and Zometa .     Doris Smith is a 73 year old female with breast cancer who presents with persistent nausea and vomiting.  She experiences persistent nausea and vomiting, feeling nauseated constantly, even during meals. Vomiting occurs occasionally after eating, such as after consuming a chicken sandwich or chocolate milk. These symptoms have persisted for over a week and have been a recurring issue. She takes nausea medication at night but is unsure of its  effectiveness as she is asleep during its action. Occasionally, she wakes up feeling nauseated.  She is on an increased dose of Verzenio , taking 100 mg twice daily, with a recent prescription change. Despite the increase, she does not believe the medication change is related to her nausea. She also takes Zyprexa  at night, which has been recently refilled.  Her history of breast cancer includes gastrointestinal involvement, impacting her ability to maintain nutrition due to nausea. She has tried drinking Boost but finds it too strong. She also reports issues with breathing and has previously arranged for an x-ray due to concerns about fluid in her lungs, but was unable to attend the appointment due to gastrointestinal symptoms, including diarrhea.  She has a port for treatments and prefers using it to avoid multiple needle sticks. There was confusion regarding her appointments, but it was clarified that she needs an appointment every time she does her labs due to the port.   Patient Active Problem List   Diagnosis Date Noted  . Malnutrition of moderate degree 08/17/2023  . GAVE (gastric antral vascular ectasia) 08/17/2023  . Gastric erosion 08/17/2023  . Melena 08/16/2023  . Anemia associated with acute blood loss 08/16/2023  . Peripheral edema 12/29/2022  . Restless leg syndrome 10/03/2021  . Anxiety associated with depression 06/10/2020  . Port-A-Cath in place 11/12/2018  . Malignant neoplasm of upper-outer quadrant of right breast in  female, estrogen receptor positive (HCC) 08/08/2018  . Right knee pain 03/01/2015  . Aortic atherosclerosis (HCC) 12/28/2014  . Obesity (BMI 30-39.9) 04/09/2014  . Insomnia due to stress 04/09/2014  . Atrial fibrillation (HCC) 10/01/2013  . GERD (gastroesophageal reflux disease) 10/01/2013  . Bipolar disorder (HCC) 09/27/2007  . Essential hypertension 09/27/2007  . ALLERGIC RHINITIS 09/27/2007  . IRRITABLE BOWEL SYNDROME 09/27/2007  . Arthropathy  09/27/2007  . DEGENERATIVE DISC DISEASE, LUMBOSACRAL SPINE 09/27/2007    is allergic to iohexol, codeine, palonosetron , prednisone , sulfonamide derivatives, celecoxib, sulfa antibiotics, and vitamin b12.  MEDICAL HISTORY: Past Medical History:  Diagnosis Date  . A-fib (HCC) 11/2012   PAF  . Anxiety    takes Ativan   . Asthma 04/07/2011   dx  . Bipolar disorder (HCC)   . Cancer [facility])    Right breast  . Depression   . Early cataracts, bilateral   . Fatty liver   . Fibromyalgia   . GERD (gastroesophageal reflux disease)   . Irritable bowel syndrome   . Mental disorder    dx bipolar  . PONV (postoperative nausea and vomiting)   . Sleep apnea 04/2011   does not wear CPAP  . Tardive dyskinesia     SURGICAL HISTORY: Past Surgical History:  Procedure Laterality Date  . ABDOMINAL HYSTERECTOMY    . BIOPSY  08/17/2023   Procedure: BIOPSY;  Surgeon: Kenney Peacemaker, MD;  Location: [facility];  Service: Gastroenterology;;  . COLONOSCOPY    . DILATION AND CURETTAGE OF UTERUS  1981   abnormal pap  . ESOPHAGOGASTRODUODENOSCOPY (EGD) WITH PROPOFOL  N/A 08/17/2023   Procedure: ESOPHAGOGASTRODUODENOSCOPY (EGD) WITH PROPOFOL ;  Surgeon: Kenney Peacemaker, MD;  Location: [facility];  Service: Gastroenterology;  Laterality: N/A;  . HOT HEMOSTASIS N/A 08/17/2023   Procedure: HOT HEMOSTASIS (ARGON PLASMA COAGULATION/BICAP);  Surgeon: Kenney Peacemaker, MD;  Location: [facility];  Service: Gastroenterology;  Laterality: N/A;  . IR IMAGING GUIDED PORT INSERTION  11/20/2023  . IR THORACENTESIS ASP PLEURAL SPACE W/IMG GUIDE  09/13/2023  . IR THORACENTESIS ASP PLEURAL SPACE W/IMG GUIDE  11/20/2023  . KNEE ARTHROSCOPY Left 2007   x 2  . LUMBAR LAMINECTOMY/DECOMPRESSION MICRODISCECTOMY  05/31/2011   Procedure: LUMBAR LAMINECTOMY/DECOMPRESSION MICRODISCECTOMY;  Surgeon: Loel Ring;  Location: [facility];  Service: Orthopedics;  Laterality: Left;  Decompression Lumbar four to five and  Lumbar five to Sacral  one on Left  (X-Ray)  . MASTECTOMY W/ SENTINEL NODE BIOPSY Right 09/16/2018   Procedure: RIGHT MASTECTOMY WITH RIGHT AXILLARY SENTINEL LYMPH NODE BIOPSY;  Surgeon: Juanita Norlander, MD;  Location: [facility];  Service: General;  Laterality: Right;  . PARTIAL HYSTERECTOMY  1982  . PORTACATH PLACEMENT Left 10/31/2018   Procedure: INSERTION PORT-A-CATH WITH ULTRASOUND;  Surgeon: Juanita Norlander, MD;  Location: [facility];  Service: General;  Laterality: Left;  . TONSILLECTOMY     as child  . TOTAL KNEE ARTHROPLASTY Left 12/18/2005    SOCIAL HISTORY: Social History   Socioeconomic History  . Marital status: Married    Spouse name: Royston Cornea  . Number of children: 2  . Years of education: 28  . Highest education level: Not on file  Occupational History  . Occupation: Disabled  Tobacco Use  . Smoking status: Never  . Smokeless tobacco: Never  Vaping Use  . Vaping status: Never Used  Substance and Sexual Activity  . Alcohol use: No  . Drug use: No  . Sexual activity: Yes    Birth control/protection: Post-menopausal,  Surgical  Other Topics Concern  . Not on file  Social History Narrative   Tea daily.  Rarely has caffeine    Social Drivers of Corporate investment banker Strain: Patient Declined (10/22/2023)   Overall Financial Resource Strain (CARDIA)   . Difficulty of Paying Living Expenses: Patient declined  Food Insecurity: Patient Declined (10/22/2023)   Hunger Vital Sign   . Worried About Programme researcher, broadcasting/film/video in the Last Year: Patient declined   . Ran Out of Food in the Last Year: Patient declined  Transportation Needs: No Transportation Needs (10/22/2023)   PRAPARE - Transportation   . Lack of Transportation (Medical): No   . Lack of Transportation (Non-Medical): No  Physical Activity: Unknown (10/22/2023)   Exercise Vital Sign   . Days of Exercise per Week: 0 days   . Minutes of Exercise per Session: Not on file  Stress: Stress Concern Present (10/22/2023)   [employer] of Occupational Health - Occupational Stress Questionnaire   . Feeling of Stress : To some extent  Social Connections: Unknown (10/22/2023)   Social Connection and Isolation Panel   . Frequency of Communication with Friends and Family: More than three times a week   . Frequency of Social Gatherings with Friends and Family: Twice a week   . Attends Religious Services: Patient declined   . Active Member of Clubs or Organizations: No   . Attends Banker Meetings: Never   . Marital Status: Married  Catering manager Violence: Not At Risk (08/16/2023)   Humiliation, Afraid, Rape, and Kick questionnaire   . Fear of Current or Ex-Partner: No   . Emotionally Abused: No   . Physically Abused: No   . Sexually Abused: No    FAMILY HISTORY: Family History  Problem Relation Age of Onset  . Anesthesia problems Mother   . Heart disease Mother        CHF, atrial fib  . Heart failure Mother   . Anesthesia problems Sister   . Colon polyps Father   . Heart disease Father        Fluid around the heart  . Hypertension Sister   . Breast cancer Maternal Aunt   . Colon cancer Maternal Aunt   . Prostate cancer Neg Hx   . Ovarian cancer Neg Hx     Review of Systems  Constitutional:  Negative for appetite change, chills, fatigue, fever and unexpected weight change.  HENT:   Negative for hearing loss, lump/mass and trouble swallowing.   Eyes:  Negative for eye problems and icterus.  Respiratory:  Negative for chest tightness, cough and shortness of breath.   Cardiovascular:  Negative for chest pain, leg swelling and palpitations.  Gastrointestinal:  Negative for abdominal distention, abdominal pain, constipation, diarrhea, nausea and vomiting.  Endocrine: Negative for hot flashes.  Genitourinary:  Negative for difficulty urinating.   Musculoskeletal:  Negative for arthralgias.  Skin:  Negative for itching and rash.  Neurological:  Negative for dizziness, extremity weakness,  headaches and numbness.  Hematological:  Negative for adenopathy. Does not bruise/bleed easily.  Psychiatric/Behavioral:  Negative for depression. The patient is not nervous/anxious.       PHYSICAL EXAMINATION  BP 99/84 (BP Location: Left Arm, Patient Position: Sitting)   Pulse 81   Temp 98.4 F (36.9 C) (Temporal)   Resp 17   Wt 146 lb 12.8 oz (66.6 kg)   SpO2 98%   BMI 25.20 kg/m    She appears to be in  no acute distress Rest of the PE deferred in lieu of counseling.  LABORATORY DATA:  CBC    Component Value Date/Time   WBC 2.6 (L) 12/12/2023 1456   WBC 2.9 (L) 10/16/2023 1103   RBC 2.64 (L) 12/12/2023 1456   HGB 9.0 (L) 12/12/2023 1456   HGB 12.0 06/05/2022 1024   HCT 27.1 (L) 12/12/2023 1456   HCT 37.4 06/05/2022 1024   PLT 118 (L) 12/12/2023 1456   PLT 251 06/05/2022 1024   MCV 102.7 (H) 12/12/2023 1456   MCV 86 06/05/2022 1024   MCH 34.1 (H) 12/12/2023 1456   MCHC 33.2 12/12/2023 1456   RDW 14.8 12/12/2023 1456   RDW 15.5 (H) 06/05/2022 1024   LYMPHSABS 0.3 (L) 12/12/2023 1456   LYMPHSABS 0.9 06/05/2022 1024   MONOABS 0.2 12/12/2023 1456   EOSABS 0.1 12/12/2023 1456   EOSABS 0.1 06/05/2022 1024   BASOSABS 0.0 12/12/2023 1456   BASOSABS 0.1 06/05/2022 1024    CMP     Component Value Date/Time   NA 139 12/12/2023 1456   NA 145 (H) 06/05/2022 1024   K 3.8 12/12/2023 1456   CL 107 12/12/2023 1456   CO2 27 12/12/2023 1456   GLUCOSE 144 (H) 12/12/2023 1456   BUN 10 12/12/2023 1456   BUN 17 06/05/2022 1024   CREATININE 0.69 12/12/2023 1456   CALCIUM 8.8 (L) 12/12/2023 1456   PROT 5.9 (L) 12/12/2023 1456   PROT 6.6 06/05/2022 1024   ALBUMIN 3.4 (L) 12/12/2023 1456   ALBUMIN 4.0 06/05/2022 1024   AST 34 12/12/2023 1456   ALT 14 12/12/2023 1456   ALKPHOS 339 (H) 12/12/2023 1456   BILITOT 0.4 12/12/2023 1456   GFRNONAA >60 12/12/2023 1456   GFRAA 81 02/03/2020 1049   GFRAA >60 12/19/2018 1434       ASSESSMENT and THERAPY PLAN:   No  problem-specific Assessment & Plan notes found for this encounter.   All questions were answered. The patient knows to call the clinic with any problems, questions or concerns. We can certainly see the patient much sooner if necessary.  Total encounter time:30 minutes*in face-to-face visit time, chart review, lab review, care coordination, order entry, and documentation of the encounter time.  *Total Encounter Time as defined by the Centers for Medicare and Medicaid Services includes, in addition to the face-to-face time of a patient visit (documented in the note above) non-face-to-face time: obtaining and reviewing outside history, ordering and reviewing medications, tests or procedures, care coordination (communications with other health care professionals or caregivers) and documentation in the medical record.

## 2023-12-28 ENCOUNTER — Encounter: Payer: Self-pay | Admitting: Hematology and Oncology

## 2023-12-28 LAB — CANCER ANTIGEN 27.29: CA 27.29: 1240.5 U/mL — ABNORMAL HIGH (ref 0.0–38.6)

## 2023-12-28 NOTE — Assessment & Plan Note (Signed)
 Assessment and Plan Assessment & Plan  Assessment and Plan Assessment & Plan Stage 4 Breast Cancer with bone, liver, gastrointestinal disease. progression despite Verzenio . PET scan: mixed response, bone improvement, new liver metastases. Current treatment inadequate. Discussed options: increase Verzenio , switch to Orserdu, Truqap with faslodex , Xeloda. Risks.  - She wanted to try higher dose of verzenio  before trying other options. - She is here on 100 mg PO BID, doing reasonably well. Continue current dose. - CBC reviewed, no indication for transfusion - Consider switching to Orserdu or Xeloda if Verzenio  dose increase is ineffective.  Pleural Effusion CXR with moderate bilateral effusion. No resp distress, able to have conversation comfortably. She will have thoracentesis done, US  ordered.  Anxiety Anxiety potentially contributing to nausea and overall well-being. Increase zyprexa  dose to 5 mg po daily at bedtime.  Goals of Care Desires to continue treatment despite progression, understanding seriousness. Willing to try new treatments to maintain quality of life.  Follow-up  - As scheduled.

## 2024-01-01 ENCOUNTER — Ambulatory Visit (HOSPITAL_COMMUNITY)
Admission: RE | Admit: 2024-01-01 | Discharge: 2024-01-01 | Disposition: A | Discharge status: Home / Self Care | Source: Ambulatory Visit | Attending: Radiology | Admitting: Radiology

## 2024-01-01 ENCOUNTER — Ambulatory Visit (HOSPITAL_COMMUNITY)
Admission: RE | Admit: 2024-01-01 | Discharge: 2024-01-01 | Disposition: A | Discharge status: Home / Self Care | Source: Ambulatory Visit | Attending: Hematology and Oncology | Admitting: Hematology and Oncology

## 2024-01-01 DIAGNOSIS — Z170 Estrogen receptor positive status [ER+]: Secondary | ICD-10-CM | POA: Insufficient documentation

## 2024-01-01 DIAGNOSIS — Z48813 Encounter for surgical aftercare following surgery on the respiratory system: Secondary | ICD-10-CM | POA: Diagnosis not present

## 2024-01-01 DIAGNOSIS — C7951 Secondary malignant neoplasm of bone: Secondary | ICD-10-CM | POA: Diagnosis not present

## 2024-01-01 DIAGNOSIS — J90 Pleural effusion, not elsewhere classified: Secondary | ICD-10-CM | POA: Diagnosis not present

## 2024-01-01 DIAGNOSIS — C50411 Malignant neoplasm of upper-outer quadrant of right female breast: Secondary | ICD-10-CM | POA: Diagnosis not present

## 2024-01-01 DIAGNOSIS — C801 Malignant (primary) neoplasm, unspecified: Secondary | ICD-10-CM | POA: Diagnosis not present

## 2024-01-01 MED ORDER — LIDOCAINE HCL 1 % IJ SOLN
INTRAMUSCULAR | Status: AC
  Filled 2024-01-01: qty 20

## 2024-01-01 NOTE — Procedures (Signed)
 Ultrasound-guided therapeutic right thoracentesis performed yielding 425 cc of yellow fluid. No immediate complications. Follow-up chest x-ray pending. EBL none.

## 2024-01-03 ENCOUNTER — Telehealth: Payer: Self-pay

## 2024-01-03 ENCOUNTER — Inpatient Hospital Stay

## 2024-01-03 ENCOUNTER — Other Ambulatory Visit: Payer: Self-pay | Admitting: Nurse Practitioner

## 2024-01-03 ENCOUNTER — Inpatient Hospital Stay: Admitting: Adult Health

## 2024-01-03 ENCOUNTER — Inpatient Hospital Stay: Admitting: Nurse Practitioner

## 2024-01-03 DIAGNOSIS — G2581 Restless legs syndrome: Secondary | ICD-10-CM

## 2024-01-03 MED ORDER — PRAMIPEXOLE DIHYDROCHLORIDE 1 MG PO TABS: 1.0000 mg | ORAL_TABLET | Freq: Three times a day (TID) | ORAL | 0 refills | Status: DC

## 2024-01-03 NOTE — Telephone Encounter (Signed)
 Appt 01-04-2024 with MMM

## 2024-01-03 NOTE — Telephone Encounter (Signed)
 Patient called regarding today's appointments. Patient was originally scheduled to see palliative care team and receive zometa  infusion. According to Dr. Loretha and pharmacy team, patient is scheduled a week early to receive infusion. Patient rescheduled to 7/2 for port flush w/lab, palliative care visit, and zometa /faslodex  infusion, as patient would be due for both. Patient agreeable to the change and confirmed new appointment dates and times. Patient aware that today's appointments will be canceled. All questions answered during call.

## 2024-01-03 NOTE — Addendum Note (Signed)
 Addended by: Junell Cullifer D on: 01/03/2024 10:26 AM   Modules accepted: Orders

## 2024-01-03 NOTE — Telephone Encounter (Signed)
 MMM pt NTBS 30-d given 10/01/23

## 2024-01-04 ENCOUNTER — Ambulatory Visit (INDEPENDENT_AMBULATORY_CARE_PROVIDER_SITE_OTHER): Admitting: Nurse Practitioner

## 2024-01-04 ENCOUNTER — Encounter: Payer: Self-pay | Admitting: Nurse Practitioner

## 2024-01-04 VITALS — BP 112/69 | HR 69 | Temp 97.4°F | Ht 64.0 in | Wt 145.0 lb

## 2024-01-04 DIAGNOSIS — C50411 Malignant neoplasm of upper-outer quadrant of right female breast: Secondary | ICD-10-CM

## 2024-01-04 DIAGNOSIS — I10 Essential (primary) hypertension: Secondary | ICD-10-CM

## 2024-01-04 DIAGNOSIS — I480 Paroxysmal atrial fibrillation: Secondary | ICD-10-CM

## 2024-01-04 DIAGNOSIS — F319 Bipolar disorder, unspecified: Secondary | ICD-10-CM

## 2024-01-04 DIAGNOSIS — K219 Gastro-esophageal reflux disease without esophagitis: Secondary | ICD-10-CM | POA: Diagnosis not present

## 2024-01-04 DIAGNOSIS — F419 Anxiety disorder, unspecified: Secondary | ICD-10-CM

## 2024-01-04 DIAGNOSIS — F5102 Adjustment insomnia: Secondary | ICD-10-CM

## 2024-01-04 DIAGNOSIS — F418 Other specified anxiety disorders: Secondary | ICD-10-CM

## 2024-01-04 DIAGNOSIS — E669 Obesity, unspecified: Secondary | ICD-10-CM

## 2024-01-04 DIAGNOSIS — I700 Atherosclerosis of aorta: Secondary | ICD-10-CM

## 2024-01-04 DIAGNOSIS — R600 Localized edema: Secondary | ICD-10-CM

## 2024-01-04 DIAGNOSIS — G2581 Restless legs syndrome: Secondary | ICD-10-CM

## 2024-01-04 MED ORDER — PANTOPRAZOLE SODIUM 40 MG PO TBEC: 40.0000 mg | DELAYED_RELEASE_TABLET | Freq: Two times a day (BID) | ORAL | 1 refills | Status: DC

## 2024-01-04 MED ORDER — LORAZEPAM 1 MG PO TABS: 1.0000 mg | ORAL_TABLET | Freq: Two times a day (BID) | ORAL | 5 refills | Status: DC | PRN

## 2024-01-04 MED ORDER — PRAMIPEXOLE DIHYDROCHLORIDE 1 MG PO TABS: 1.0000 mg | ORAL_TABLET | Freq: Three times a day (TID) | ORAL | 1 refills | Status: DC

## 2024-01-04 NOTE — Progress Notes (Signed)
 Subjective:    Patient ID: Doris Smith, female    DOB: 02/26/1951, 73 y.o.   MRN: [id number]   Chief Complaint: medical management of chronic issues     HPI:  Doris Smith is a 73 y.o. who identifies as a female who was assigned female at birth.   Social history: Lives with: husband Work history: retired   Water engineer in today for follow up of the following chronic medical issues:  1. Essential hypertension No c/o chest pain, sob or headache. Does not check blood pressure at home. BP Readings from Last 3 Encounters:  01/01/24 (!) 111/35  12/27/23 99/84  12/12/23 110/86     2. Aortic atherosclerosis (HCC) 3. Paroxysmal atrial fibrillation (HCC) Has not seen cardiology in over a year. Is on eliquis  BID with no bleeding issues.  4. Gastroesophageal reflux disease without esophagitis Is on nexium  daily nad seems to be hleping with symptoms.  5. Irritable bowel syndrome with both constipation and diarrhea Seems to be stable right now.   6. Anxiety associated with depression 7. Bipolar affective disorder, remission status unspecified (HCC) Is on combination of viibryd  and Latuda . She says she is doing well. Has some down days due to her cancer, but all in all is doing well.     01/04/2024    9:18 AM 05/24/2023   11:20 AM 05/03/2023   10:56 AM  Depression screen PHQ 2/9  Decreased Interest 0 0 2  Down, Depressed, Hopeless 0 1 3  PHQ - 2 Score 0 1 5  Altered sleeping 2 3 3   Tired, decreased energy 2 3 3   Change in appetite 2 3 3   Feeling bad or failure about yourself  0 0 0  Trouble concentrating 0 3 3  Moving slowly or fidgety/restless 0 3 3  Suicidal thoughts 0 0 0  PHQ-9 Score 6 16 20   Difficult doing work/chores Not difficult at all Extremely dIfficult Extremely dIfficult      01/04/2024    9:18 AM 05/24/2023   11:20 AM 05/03/2023   10:56 AM 12/29/2022   11:56 AM  GAD 7 : Generalized Anxiety Score  Nervous, Anxious, on Edge 0 2 3 2    Control/stop worrying 0 2 3 1   Worry too much - different things 0 1 3 2   Trouble relaxing 2 3 3 3   Restless 0 3 3 3   Easily annoyed or irritable 1 2 2 2   Afraid - awful might happen 0 0 2 0  Total GAD 7 Score 3 13 19 13   Anxiety Difficulty Not difficult at all Extremely difficult Extremely difficult Not difficult at all         8. Insomnia due to stress Is sleeping well most nights  9. Restless leg syndrome Is on mirapex  which helps but does not 100% treat symptoms.  10. Obesity (BMI 30-39.9) Weight is down 7lbs. Wt Readings from Last 3 Encounters:  12/27/23 146 lb 12.8 oz (66.6 kg)  12/12/23 153 lb 12.8 oz (69.8 kg)  11/20/23 155 lb 10.3 oz (70.6 kg)   BMI Readings from Last 3 Encounters:  12/27/23 25.20 kg/m  12/12/23 26.40 kg/m  11/20/23 26.72 kg/m     11. Malignant neoplasm of upper-outer quadrant of right breast in female, estrogen receptor positive (HCC) Has metastasis to bone. Is on Faslodex  and zometa . She is going to follow up with palliative care for pain management. She had oncology follow up on 12/27/23. She is to stay current course of treatment. Cancer  has now spread to her liver.  12. DDD- umbar disc disease Is on neurontin  which helps some. Thinks some of her pain is from her cancer.  13. Peripheral edema Is on lasix  daily which helps. Still has some swelling by the end of the day. Will resolve at night usually.  New complaints: None today  Allergies  Allergen Reactions   Iohexol     over 20 years ago had reaction hurting in arm and heart-Done in [place], [place].SABRAphysician stopped CT at that time.   Codeine Other (See Comments)    Makes feel like she is wild    Palonosetron  Other (See Comments)    headache   Prednisone  Other (See Comments)    Makes me very irritable   Sulfonamide Derivatives Other (See Comments)    Upset stomach   Celecoxib Nausea And Vomiting   Sulfa Antibiotics Nausea And Vomiting   Vitamin B12 Rash   Outpatient  Encounter Medications as of 01/04/2024  Medication Sig   abemaciclib  (VERZENIO ) 100 MG tablet Take 1 tablet (100 mg total) by mouth 2 (two) times daily.   ciprofloxacin  (CIPRO ) 500 MG tablet Take 500 mg by mouth 2 (two) times daily.   cyanocobalamin  1000 MCG tablet Take 1 tablet (1,000 mcg total) by mouth daily.   folic acid  (FOLVITE ) 1 MG tablet Take 1 tablet (1 mg total) by mouth daily. (Patient not taking: Reported on 10/23/2023)   Incontinence Supply Disposable (CERTAINTY FITTED BRIEFS XL) MISC 1 Package by Does not apply route as needed.   lidocaine  (LIDODERM ) 5 % Place 2 patches onto the skin daily. Remove & Discard patch within 12 hours or as directed by MD   LORazepam  (ATIVAN ) 1 MG tablet Take 1 tablet (1 mg total) by mouth 2 (two) times daily as needed for anxiety. (Patient not taking: Reported on 10/23/2023)   OLANZapine  (ZYPREXA ) 5 MG tablet Take 1 tablet (5 mg total) by mouth at bedtime.   ondansetron  (ZOFRAN ) 8 MG tablet Take 1 tablet (8 mg total) by mouth every 8 (eight) hours as needed for nausea or vomiting.   oxyCODONE  (OXYCONTIN ) 15 mg 12 hr tablet Take 1 tablet (15 mg total) by mouth every 12 (twelve) hours.   oxyCODONE -acetaminophen  (PERCOCET) 10-325 MG tablet Take 1 tablet by mouth every 4 (four) hours as needed for pain.   pantoprazole  (PROTONIX ) 40 MG tablet Take 1 tablet (40 mg total) by mouth 2 (two) times daily before a meal.   phenazopyridine  (PYRIDIUM ) 200 MG tablet Take 1 tablet (200 mg total) by mouth 3 (three) times daily as needed for pain.   polyethylene glycol (MIRALAX / GLYCOLAX) 17 g packet Take 17 g by mouth daily.   pramipexole  (MIRAPEX ) 1 MG tablet Take 1 tablet (1 mg total) by mouth 3 (three) times daily.   prochlorperazine  (COMPAZINE ) 10 MG tablet Take 1 tablet (10 mg total) by mouth every 6 (six) hours as needed for nausea or vomiting.   Facility-Administered Encounter Medications as of 01/04/2024  Medication   heparin  lock flush 100 unit/mL   sodium  chloride flush (NS) 0.9 % injection 10 mL    Past Surgical History:  Procedure Laterality Date   ABDOMINAL HYSTERECTOMY     BIOPSY  08/17/2023   Procedure: BIOPSY;  Surgeon: Avram Lupita BRAVO, MD;  Location: [facility];  Service: Gastroenterology;;   COLONOSCOPY     DILATION AND CURETTAGE OF UTERUS  1981   abnormal pap   ESOPHAGOGASTRODUODENOSCOPY (EGD) WITH PROPOFOL  N/A 08/17/2023   Procedure: ESOPHAGOGASTRODUODENOSCOPY (  EGD) WITH PROPOFOL ;  Surgeon: Avram Lupita BRAVO, MD;  Location: [facility];  Service: Gastroenterology;  Laterality: N/A;   HOT HEMOSTASIS N/A 08/17/2023   Procedure: HOT HEMOSTASIS (ARGON PLASMA COAGULATION/BICAP);  Surgeon: Avram Lupita BRAVO, MD;  Location: [facility];  Service: Gastroenterology;  Laterality: N/A;   IR IMAGING GUIDED PORT INSERTION  11/20/2023   IR THORACENTESIS ASP PLEURAL SPACE W/IMG GUIDE  09/13/2023   IR THORACENTESIS ASP PLEURAL SPACE W/IMG GUIDE  11/20/2023   KNEE ARTHROSCOPY Left 2007   x 2   LUMBAR LAMINECTOMY/DECOMPRESSION MICRODISCECTOMY  05/31/2011   Procedure: LUMBAR LAMINECTOMY/DECOMPRESSION MICRODISCECTOMY;  Surgeon: Reyes JAYSON Billing;  Location: [facility];  Service: Orthopedics;  Laterality: Left;  Decompression Lumbar four to five and  Lumbar five to Sacral one on Left  (X-Ray)   MASTECTOMY W/ SENTINEL NODE BIOPSY Right 09/16/2018   Procedure: RIGHT MASTECTOMY WITH RIGHT AXILLARY SENTINEL LYMPH NODE BIOPSY;  Surgeon: Ethyl Lenis, MD;  Location: [facility];  Service: General;  Laterality: Right;   PARTIAL HYSTERECTOMY  1982   PORTACATH PLACEMENT Left 10/31/2018   Procedure: INSERTION PORT-A-CATH WITH ULTRASOUND;  Surgeon: Ethyl Lenis, MD;  Location: [facility];  Service: General;  Laterality: Left;   TONSILLECTOMY     as child   TOTAL KNEE ARTHROPLASTY Left 12/18/2005    Family History  Problem Relation Age of Onset   Anesthesia problems Mother    Heart disease Mother        CHF, atrial fib   Heart failure Mother    Anesthesia  problems Sister    Colon polyps Father    Heart disease Father        Fluid around the heart   Hypertension Sister    Breast cancer Maternal Aunt    Colon cancer Maternal Aunt    Prostate cancer Neg Hx    Ovarian cancer Neg Hx       Controlled substance contract: n/a     Review of Systems  Constitutional:  Negative for diaphoresis.  Eyes:  Negative for pain.  Respiratory:  Negative for shortness of breath.   Cardiovascular:  Negative for chest pain, palpitations and leg swelling.  Gastrointestinal:  Negative for abdominal pain.  Endocrine: Negative for polydipsia.  Musculoskeletal:  Positive for back pain and myalgias.  Skin:  Negative for rash.  Neurological:  Negative for dizziness, weakness and headaches.  Hematological:  Does not bruise/bleed easily.  All other systems reviewed and are negative.      Objective:   Physical Exam Vitals and nursing note reviewed.  Constitutional:      General: She is not in acute distress.    Appearance: Normal appearance. She is well-developed.  HENT:     Head: Normocephalic.     Right Ear: Tympanic membrane normal.     Left Ear: Tympanic membrane normal.     Nose: Nose normal.     Mouth/Throat:     Mouth: Mucous membranes are moist.   Eyes:     Pupils: Pupils are equal, round, and reactive to light.   Neck:     Vascular: No carotid bruit or JVD.   Cardiovascular:     Rate and Rhythm: Normal rate. Rhythm irregular.     Heart sounds: Normal heart sounds.  Pulmonary:     Effort: Pulmonary effort is normal. No respiratory distress.     Breath sounds: Normal breath sounds. No wheezing or rales.  Chest:     Chest wall: No tenderness.  Abdominal:  General: Bowel sounds are normal. There is no distension or abdominal bruit.     Palpations: Abdomen is soft. There is no hepatomegaly, splenomegaly, mass or pulsatile mass.     Tenderness: There is no abdominal tenderness.   Musculoskeletal:        General: Normal range  of motion.     Cervical back: Normal range of motion and neck supple.     Comments: Not able to get on exam table due to pain in back  Lymphadenopathy:     Cervical: No cervical adenopathy.   Skin:    General: Skin is warm and dry.   Neurological:     Mental Status: She is alert and oriented to person, place, and time.     Deep Tendon Reflexes: Reflexes are normal and symmetric.   Psychiatric:        Behavior: Behavior normal.        Thought Content: Thought content normal.        Judgment: Judgment normal.    BP 112/69   Pulse 69   Temp (!) 97.4 F (36.3 C) (Temporal)   Ht 5' 4 (1.626 m)   Wt 145 lb (65.8 kg)   SpO2 97%   BMI 24.89 kg/m        Assessment & Plan:   Doris Smith comes in today with chief complaint of medical management of chronic issues    Diagnosis and orders addressed:  1. Essential hypertension Low sodium diet  2. Aortic atherosclerosis (HCC) 3. Paroxysmal atrial fibrillation (HCC) Needs to follow up with cariology - apixaban  (ELIQUIS ) 5 MG TABS tablet; Take 1 tablet (5 mg total) by mouth 2 (two) times daily.  Dispense: 180 tablet; Refill: 1 - metoprolol  tartrate (LOPRESSOR ) 25 MG tablet; Take 1 tablet (25 mg total) by mouth 2 (two) times daily.  Dispense: 180 tablet; Refill: 1  4. Gastroesophageal reflux disease without esophagitis Avoid spicy foods Do not eat 2 hours prior to bedtime - esomeprazole  (NEXIUM ) 40 MG capsule; Take 1 capsule (40 mg total) by mouth daily.  Dispense: 90 capsule; Refill: 0  5. Irritable bowel syndrome with both constipation and diarrhea Watch diet to prevent flare ups  6. Anxiety associated with depression 7. Bipolar affective disorder, remission status unspecified (HCC) Stress management  8. Insomnia due to stress Bedtime routine  9. Restless leg syndrome Keep legs warm at night - pramipexole  (MIRAPEX ) 1 MG tablet; Take 1 tablet (1 mg total) by mouth 3 (three) times daily.  Dispense: 90 tablet;  Refill: 0  10. Obesity (BMI 30-39.9) Discussed diet and exercise for person with BMI >25 Will recheck weight in 3-6 months   11. Malignant neoplasm of upper-outer quadrant of right breast in female, estrogen receptor positive (HCC) Keep oncology followup  12. DEGENERATIVE DISC DISEASE, LUMBOSACRAL SPINE - gabapentin  (NEURONTIN ) 300 MG capsule; Take 1 capsule (300 mg total) by mouth 3 (three) times daily.  Dispense: 90 capsule; Refill: 5  13. Peripheral edema Elevate legs when sitting  Labs pending Health Maintenance reviewed Diet and exercise encouraged  Follow up plan: 6 months   [name] Gladis, FNP

## 2024-01-07 ENCOUNTER — Other Ambulatory Visit (HOSPITAL_COMMUNITY): Payer: Self-pay

## 2024-01-07 ENCOUNTER — Ambulatory Visit

## 2024-01-07 ENCOUNTER — Telehealth: Payer: Self-pay

## 2024-01-07 NOTE — Telephone Encounter (Signed)
 Pharmacy Patient Advocate Encounter   Received notification from CoverMyMeds that prior authorization for LORAZEPAM  1MG  TABS is required/requested.   Insurance verification completed.   The patient is insured through [employer] [facility] .   Per test claim: PA required; PA submitted to above mentioned insurance via CoverMyMeds Key/confirmation #/EOC [facility] Status is pending

## 2024-01-08 ENCOUNTER — Other Ambulatory Visit (HOSPITAL_COMMUNITY): Payer: Self-pay

## 2024-01-08 NOTE — Telephone Encounter (Signed)
 Pt has been notified.

## 2024-01-08 NOTE — Telephone Encounter (Signed)
 Pharmacy Patient Advocate Encounter  Received notification from [facility] that Prior Authorization for Lorazepam  1MG  TABS has been APPROVED from 01/07/24 to 01/06/25   PA #/Case ID/Reference #: [id number]

## 2024-01-09 ENCOUNTER — Inpatient Hospital Stay: Attending: Hematology and Oncology

## 2024-01-09 ENCOUNTER — Encounter: Payer: Self-pay | Admitting: Nurse Practitioner

## 2024-01-09 ENCOUNTER — Inpatient Hospital Stay

## 2024-01-09 ENCOUNTER — Inpatient Hospital Stay: Admitting: Nurse Practitioner

## 2024-01-09 VITALS — BP 120/42 | HR 83 | Temp 98.1°F | Resp 16 | Ht 64.0 in | Wt 148.9 lb

## 2024-01-09 VITALS — BP 110/46 | HR 67 | Resp 18

## 2024-01-09 DIAGNOSIS — Z9011 Acquired absence of right breast and nipple: Secondary | ICD-10-CM | POA: Insufficient documentation

## 2024-01-09 DIAGNOSIS — Z515 Encounter for palliative care: Secondary | ICD-10-CM

## 2024-01-09 DIAGNOSIS — Z170 Estrogen receptor positive status [ER+]: Secondary | ICD-10-CM | POA: Diagnosis not present

## 2024-01-09 DIAGNOSIS — R530 Neoplastic (malignant) related fatigue: Secondary | ICD-10-CM

## 2024-01-09 DIAGNOSIS — C50411 Malignant neoplasm of upper-outer quadrant of right female breast: Secondary | ICD-10-CM | POA: Insufficient documentation

## 2024-01-09 DIAGNOSIS — Z79899 Other long term (current) drug therapy: Secondary | ICD-10-CM | POA: Diagnosis not present

## 2024-01-09 DIAGNOSIS — D6481 Anemia due to antineoplastic chemotherapy: Secondary | ICD-10-CM | POA: Diagnosis not present

## 2024-01-09 DIAGNOSIS — G893 Neoplasm related pain (acute) (chronic): Secondary | ICD-10-CM

## 2024-01-09 DIAGNOSIS — C7951 Secondary malignant neoplasm of bone: Secondary | ICD-10-CM | POA: Diagnosis not present

## 2024-01-09 DIAGNOSIS — Z5111 Encounter for antineoplastic chemotherapy: Secondary | ICD-10-CM | POA: Diagnosis not present

## 2024-01-09 DIAGNOSIS — T451X5A Adverse effect of antineoplastic and immunosuppressive drugs, initial encounter: Secondary | ICD-10-CM | POA: Insufficient documentation

## 2024-01-09 DIAGNOSIS — C787 Secondary malignant neoplasm of liver and intrahepatic bile duct: Secondary | ICD-10-CM | POA: Diagnosis not present

## 2024-01-09 LAB — CMP (CANCER CENTER ONLY)
ALT: 12 U/L (ref 0–44)
AST: 28 U/L (ref 15–41)
Albumin: 3.2 g/dL — ABNORMAL LOW (ref 3.5–5.0)
Alkaline Phosphatase: 395 U/L — ABNORMAL HIGH (ref 38–126)
Anion gap: 5 (ref 5–15)
BUN: 11 mg/dL (ref 8–23)
CO2: 27 mmol/L (ref 22–32)
Calcium: 8.8 mg/dL — ABNORMAL LOW (ref 8.9–10.3)
Chloride: 109 mmol/L (ref 98–111)
Creatinine: 0.74 mg/dL (ref 0.44–1.00)
GFR, Estimated: >60 mL/min (ref >60)
Glucose, Bld: 127 mg/dL — ABNORMAL HIGH (ref 70–99)
Potassium: 4 mmol/L (ref 3.5–5.1)
Sodium: 141 mmol/L (ref 135–145)
Total Bilirubin: 0.4 mg/dL (ref 0.0–1.2)
Total Protein: 5.6 g/dL — ABNORMAL LOW (ref 6.5–8.1)

## 2024-01-09 LAB — CBC WITH DIFFERENTIAL (CANCER CENTER ONLY)
Abs Immature Granulocytes: 0.01 10*3/uL (ref 0.00–0.07)
Basophils Absolute: 0 10*3/uL (ref 0.0–0.1)
Basophils Relative: 2 %
Eosinophils Absolute: 0.1 10*3/uL (ref 0.0–0.5)
Eosinophils Relative: 4 %
HCT: 25.3 % — ABNORMAL LOW (ref 36.0–46.0)
Hemoglobin: 8.2 g/dL — ABNORMAL LOW (ref 12.0–15.0)
Immature Granulocytes: 1 %
Lymphocytes Relative: 18 %
Lymphs Abs: 0.3 10*3/uL — ABNORMAL LOW (ref 0.7–4.0)
MCH: 33.2 pg (ref 26.0–34.0)
MCHC: 32.4 g/dL (ref 30.0–36.0)
MCV: 102.4 fL — ABNORMAL HIGH (ref 80.0–100.0)
Monocytes Absolute: 0.2 10*3/uL (ref 0.1–1.0)
Monocytes Relative: 9 %
Neutro Abs: 1.1 10*3/uL — ABNORMAL LOW (ref 1.7–7.7)
Neutrophils Relative %: 66 %
Platelet Count: 57 10*3/uL — ABNORMAL LOW (ref 150–400)
RBC: 2.47 MIL/uL — ABNORMAL LOW (ref 3.87–5.11)
RDW: 14 % (ref 11.5–15.5)
WBC Count: 1.7 10*3/uL — ABNORMAL LOW (ref 4.0–10.5)
nRBC: 0 % (ref 0.0–0.2)

## 2024-01-09 MED ORDER — HEPARIN SOD (PORK) LOCK FLUSH 100 UNIT/ML IV SOLN
500.0000 [IU] | Freq: Once | INTRAVENOUS | Status: AC | PRN
  Administered 2024-01-09: 500 [IU]

## 2024-01-09 MED ORDER — SODIUM CHLORIDE 0.9% FLUSH
10.0000 mL | Freq: Once | INTRAVENOUS | Status: AC | PRN
  Administered 2024-01-09: 10 mL

## 2024-01-09 MED ORDER — FULVESTRANT 250 MG/5ML IM SOSY
500.0000 mg | PREFILLED_SYRINGE | Freq: Once | INTRAMUSCULAR | Status: AC
  Administered 2024-01-09: 500 mg via INTRAMUSCULAR
  Filled 2024-01-09: qty 10

## 2024-01-09 MED ORDER — OXYCODONE HCL ER 15 MG PO T12A
15.0000 mg | EXTENDED_RELEASE_TABLET | Freq: Two times a day (BID) | ORAL | 0 refills | Status: DC
  Filled 2024-01-22: qty 60, 30d supply, fill #0

## 2024-01-09 MED ORDER — SODIUM CHLORIDE 0.9 % IV SOLN: Freq: Once | INTRAVENOUS | Status: AC

## 2024-01-09 MED ORDER — ZOLEDRONIC ACID 4 MG/100ML IV SOLN
4.0000 mg | Freq: Once | INTRAVENOUS | Status: AC
  Administered 2024-01-09: 4 mg via INTRAVENOUS
  Filled 2024-01-09: qty 100

## 2024-01-09 NOTE — Patient Instructions (Signed)
 [facility] - A DEPT OF [facility]  Discharge Instructions: Thank you for choosing [facility] to provide your oncology and hematology care.   If you have a lab appointment with the Cancer Center, please go directly to the Cancer Center and check in at the registration area.   Wear comfortable clothing and clothing appropriate for easy access to any Portacath or PICC line.   We strive to give you quality time with your provider. You may need to reschedule your appointment if you arrive late (15 or more minutes).  Arriving late affects you and other patients whose appointments are after yours.  Also, if you miss three or more appointments without notifying the office, you may be dismissed from the clinic at the provider's discretion.      For prescription refill requests, have your pharmacy contact our office and allow 72 hours for refills to be completed.    Today you received Zometa  & Faxlodex injections      To help prevent nausea and vomiting after your treatment, we encourage you to take your nausea medication as directed.  BELOW ARE SYMPTOMS THAT SHOULD BE REPORTED IMMEDIATELY: *FEVER GREATER THAN 100.4 F (38 C) OR HIGHER *CHILLS OR SWEATING *NAUSEA AND VOMITING THAT IS NOT CONTROLLED WITH YOUR NAUSEA MEDICATION *UNUSUAL SHORTNESS OF BREATH *UNUSUAL BRUISING OR BLEEDING *URINARY PROBLEMS (pain or burning when urinating, or frequent urination) *BOWEL PROBLEMS (unusual diarrhea, constipation, pain near the anus) TENDERNESS IN MOUTH AND THROAT WITH OR WITHOUT PRESENCE OF ULCERS (sore throat, sores in mouth, or a toothache) UNUSUAL RASH, SWELLING OR PAIN  UNUSUAL VAGINAL DISCHARGE OR ITCHING   Items with * indicate a potential emergency and should be followed up as soon as possible or go to the Emergency Department if any problems should occur.  Please show the CHEMOTHERAPY ALERT CARD or IMMUNOTHERAPY ALERT CARD at check-in to the  Emergency Department and triage nurse.  Should you have questions after your visit or need to cancel or reschedule your appointment, please contact [facility] - A DEPT OF [facility]  Dept: [phone]  and follow the prompts.  Office hours are 8:00 a.m. to 4:30 p.m. Monday - Friday. Please note that voicemails left after 4:00 p.m. may not be returned until the following business day.  We are closed weekends and major holidays. You have access to a nurse at all times for urgent questions. Please call the main number to the clinic Dept: [phone] and follow the prompts.   For any non-urgent questions, you may also contact your provider using MyChart. We now offer e-Visits for anyone 53 and older to request care online for non-urgent symptoms. For details visit [email or web address].   Also download the MyChart app! Go to the app store, search MyChart, open the app, select [facility], and log in with your MyChart username and password.

## 2024-01-09 NOTE — Progress Notes (Signed)
 Palliative Medicine [facility]  [phone] [phone]) [phone]   Name: Doris Smith Date: 01/09/2024 MRN: [id number]  DOB: [birth year]  Patient Care Team: [name], FNP as PCP - General (Family Medicine) Jerilynn Lamarr HERO, NP as PCP - Cardiology (Nurse Practitioner) Ethyl Lenis, MD as Consulting Physician (General Surgery) Dewey Rush, MD as Consulting Physician (Radiation Oncology) Laymon Soulier, MD as Consulting Physician (Pulmonary Disease) Kay Kemps, MD as Consulting Physician (Orthopedic Surgery) Duwayne Purchase, MD as Consulting Physician (Orthopedic Surgery) Kallie Medici, NP as Nurse Practitioner (Adult Health Nurse Practitioner) Glean Stephane BROCKS, RN (Inactive) as Oncology Nurse Navigator Tyree Nanetta SAILOR, RN as Oncology Nurse Navigator Arelia Filippo, MD as Consulting Physician (Plastic Surgery) Lavona Agent, MD as Consulting Physician (Cardiology) Loretha Ash, MD as Medical Oncologist (Hematology and Oncology) Pickenpack-Cousar, Fannie SAILOR, NP as Nurse Practitioner (Hospice and Palliative Medicine)    INTERVAL HISTORY: Doris Smith is a 73 y.o. female with oncologic medical history including estrogen receptor positive breast cancer(07/2018) s/p right mastectomy. PET scan 10/06/22 showing widespread osseous metastasis. Other pertinent history includes atrial fibrillation, asthma, OSA, GERD, fatty liver, fibromyalgia, osteoporosis, chronic headaches, anxiety, depression, and bipolar disorder. Underwent ORIF of right hip 09/20/2022. Palliative ask to see for symptom and pain management and goals of care.   SOCIAL HISTORY:     reports that she has never smoked. She has never used smokeless tobacco. She reports that she does not drink alcohol and does not use drugs.  ADVANCE DIRECTIVES:  On file  CODE STATUS: Full code  PAST MEDICAL HISTORY: Past Medical History:  Diagnosis Date   A-fib (HCC) 11/2012    PAF   Anxiety    takes Ativan    Asthma 04/07/2011   dx   Bipolar disorder (HCC)    Cancer (HCC)    Right breast   Depression    Early cataracts, bilateral    Fatty liver    Fibromyalgia    GERD (gastroesophageal reflux disease)    Irritable bowel syndrome    Mental disorder    dx bipolar   PONV (postoperative nausea and vomiting)    Sleep apnea 04/2011   does not wear CPAP   Tardive dyskinesia     ALLERGIES:  is allergic to iohexol, codeine, palonosetron , prednisone , sulfonamide derivatives, celecoxib, sulfa antibiotics, and vitamin b12.  MEDICATIONS:  Current Outpatient Medications  Medication Sig Dispense Refill   abemaciclib  (VERZENIO ) 100 MG tablet Take 1 tablet (100 mg total) by mouth 2 (two) times daily. 56 tablet 0   ciprofloxacin  (CIPRO ) 500 MG tablet Take 500 mg by mouth 2 (two) times daily.     cyanocobalamin  1000 MCG tablet Take 1 tablet (1,000 mcg total) by mouth daily. 60 tablet 0   folic acid  (FOLVITE ) 1 MG tablet Take 1 tablet (1 mg total) by mouth daily. (Patient not taking: Reported on 10/23/2023) 60 tablet 0   Incontinence Supply Disposable (CERTAINTY FITTED BRIEFS XL) MISC 1 Package by Does not apply route as needed. 56 each 3   lidocaine  (LIDODERM ) 5 % Place 2 patches onto the skin daily. Remove & Discard patch within 12 hours or as directed by MD 60 patch 2   LORazepam  (ATIVAN ) 1 MG tablet Take 1 tablet (1 mg total) by mouth 2 (two) times daily as needed for anxiety. 60 tablet 5   OLANZapine  (ZYPREXA ) 5 MG tablet Take 1 tablet (5 mg total) by mouth at bedtime. 30 tablet 1   ondansetron  (ZOFRAN ) 8 MG tablet  Take 1 tablet (8 mg total) by mouth every 8 (eight) hours as needed for nausea or vomiting. 30 tablet 5   [START ON 01/22/2024] oxyCODONE  (OXYCONTIN ) 15 mg 12 hr tablet Take 1 tablet (15 mg total) by mouth every 12 (twelve) hours. 60 tablet 0   oxyCODONE -acetaminophen  (PERCOCET) 10-325 MG tablet Take 1 tablet by mouth every 4 (four) hours as needed for  pain. 60 tablet 0   pantoprazole  (PROTONIX ) 40 MG tablet Take 1 tablet (40 mg total) by mouth 2 (two) times daily before a meal. 180 tablet 1   phenazopyridine  (PYRIDIUM ) 200 MG tablet Take 1 tablet (200 mg total) by mouth 3 (three) times daily as needed for pain. 10 tablet 0   polyethylene glycol (MIRALAX / GLYCOLAX) 17 g packet Take 17 g by mouth daily.     pramipexole  (MIRAPEX ) 1 MG tablet Take 1 tablet (1 mg total) by mouth 3 (three) times daily. 90 tablet 1   prochlorperazine  (COMPAZINE ) 10 MG tablet Take 1 tablet (10 mg total) by mouth every 6 (six) hours as needed for nausea or vomiting. 30 tablet 2   No current facility-administered medications for this visit.    VITAL SIGNS: BP (!) 120/42   Pulse 83   Temp 98.1 F (36.7 C) (Temporal)   Resp 16   Ht 5' 4 (1.626 m)   Wt 148 lb 14.4 oz (67.5 kg)   SpO2 100%   BMI 25.56 kg/m  Filed Weights   01/09/24 1300  Weight: 148 lb 14.4 oz (67.5 kg)    Estimated body mass index is 25.56 kg/m as calculated from the following:   Height as of this encounter: 5' 4 (1.626 m).   Weight as of this encounter: 148 lb 14.4 oz (67.5 kg).   PERFORMANCE STATUS (ECOG) : 1 - Symptomatic but completely ambulatory  Physical Exam General: NAD Cardiovascular: regular rate and rhythm Pulmonary: normal breathing pattern Extremities: no edema, no joint deformities Skin: no rashes Neurological: AAO x3  IMPRESSION: Discussed the use of AI scribe software for clinical note transcription with the patient, who gave verbal consent to proceed.  History of Present Illness Doris Smith is a 73 year old female with cancer who presents for follow-up. No acute distress. She is accompanied by her husband. Patient in wheelchair. Her appetite fluctuates. Some days are better than others and she eats small amounts to avoid nausea. Despite this, she has gained weight from 145 pounds on June 27th to 148.9 pounds currently, though she has lost a total  of 98 pounds over time. She has not experienced any recent episodes of dry heaving, which previously affected her appetite.  She has made progress in her mobility, stating that she can walk further while pushing a wheelchair than without it. She is proud of her ability to walk, which she was unable to do three to four months ago.   She continues to experience dyspnea despite recent thoracentesis procedures on May 13th and June 24th, which removed 500 mL and 425 mL of fluid, respectively, from her right side. She occasionally has panic attacks associated with her breathing difficulties. Encouraged patient to notify medical team with any significant changes. Her PCP is managing use of lorazepam  for anxiety. Her   Her hemoglobin has also decreased from 8.9 to 8.2. She is concerned about the impact of her treatment on her overall health and wants to avoid further deterioration. We discussed maintaining close follow-up, notifying medical team with noticeable changes, and continuing  to care for herself.   Patient becomes tearful expressing she is hopeful not to go backwards but maintain stability or forward. She is not ready to give up expressing her appreciation of how well she has been doing over the past month. Emotional support provided.   Mrs. Natarajan reports her pain is much better controlled with current regimen.  She and spouse expresses their appreciation. We discussed her current pain regimen at length which consists of OxyContin  15 mg twice daily for pain, and oxycodone  10 mg as needed for severe/breakthrough pain. Occasionally, she takes Tylenol  for pain relief.  Not requiring breakthrough medication around-the-clock. Reports having to take an as needed dose today due to increase pain however can go days without use. No adjustments to current regimen.  We will continue to closely monitor and support as needed.  All questions answered and support provided.  Goals of Care 11/30/22: Mrs. Howeth is  emotional. Her husband becomes emotional as patient shares she knows her health is not the best and her cancer is not curable. She is taking life one day at time. Speaks to when her time comes she will be ready but hopeful it is not now. She knows that no one has a definitive time frame. Expresses her appreciation in all of the care and support. Expresses her goal is to continue to treat the treatable allowing her the ability to continue to thrive while not suffering.    4/30: Since the night of 10/30/22, Mrs. Cirigliano has continued to meet goal of lying in her own bed at night beside her husband. She expresses appreciation for the care of everyone at the cancer center and is hopeful for continued improvement.  I discussed the importance of continued conversation with family and their medical providers regarding overall plan of care and treatment options, ensuring decisions are within the context of the patients values and GOCs. Assessment & Plan  Nausea Improved. No recent concerns. Denies episodes of dry heaving.  - Continue anti-nausea medication every eight hours as needed.   Fatigue Ongoing fatigue possibly related to anemia and chemotherapy. - Encourage gradual increase in physical activity as tolerated.  Cancer related pain Management Pain controlled with Tylenol , oxycodone , OxyContin , and lidocaine  patches.  No adjustments to current regimen at this time. - Continue OxyContin  15 mg every 12 hours.   -Continue lidocaine  patches as needed. - Continue Percocet 10/325 mg 1 tablet every 4 hours as needed for severe/breakthrough pain. Only requiring 1-2 times during the week.   Chemotherapy-induced anemia Hemoglobin decreased to 8.2. Symptoms include fatigue, weakness, and shortness of breath. Monitoring closely, no transfusion needed yet. - Monitor hemoglobin levels closely. - Consider transfusion if hemoglobin levels drop further or symptoms worsen as determined by Oncology.   Pleural  effusion Thoracentesis on June 24th removed 425 mL. Breathing difficulties persist, no rapid fluid reaccumulation. - Monitor respiratory symptoms and consider repeat thoracentesis if symptoms worsen.  Goals of Care Focused on maintaining health and progress, not willing to change treatment significantly. Aims to return home and enjoy life with husband. - Support her goal of maintaining current health status and avoiding regression.  -I will plan to see patient back in 3-4 weeks.   Patient expressed understanding and was in agreement with this plan. She also understands that She can call the clinic at any time with any questions, concerns, or complaints.   Any controlled substances utilized were prescribed in the context of palliative care. PDMP has been reviewed.   Visit consisted  of counseling and education dealing with the complex and emotionally intense issues of symptom management and palliative care in the setting of serious and potentially life-threatening illness.  Levon Borer, AGPCNP-BC  Palliative Medicine Team/[facility]

## 2024-01-10 ENCOUNTER — Other Ambulatory Visit: Payer: Self-pay | Admitting: Pharmacy Technician

## 2024-01-10 ENCOUNTER — Ambulatory Visit: Payer: Self-pay | Admitting: Hematology and Oncology

## 2024-01-10 ENCOUNTER — Other Ambulatory Visit: Payer: Self-pay

## 2024-01-10 ENCOUNTER — Other Ambulatory Visit (HOSPITAL_COMMUNITY): Payer: Self-pay

## 2024-01-10 ENCOUNTER — Encounter: Payer: Self-pay | Admitting: *Deleted

## 2024-01-10 ENCOUNTER — Other Ambulatory Visit: Payer: Self-pay | Admitting: Hematology and Oncology

## 2024-01-10 LAB — CANCER ANTIGEN 27.29: CA 27.29: 1166.8 U/mL — ABNORMAL HIGH (ref 0.0–38.6)

## 2024-01-10 MED ORDER — ABEMACICLIB 100 MG PO TABS
100.0000 mg | ORAL_TABLET | Freq: Two times a day (BID) | ORAL | 0 refills | Status: DC
  Filled 2024-01-10: qty 56, 28d supply, fill #0

## 2024-01-10 NOTE — Progress Notes (Signed)
 Specialty Pharmacy Refill Coordination Note  Doris Smith is a 73 y.o. female contacted today regarding refills of specialty medication(s) Abemaciclib  (VERZENIO )   Patient requested Delivery   Delivery date: 01/16/24   Verified address: [address] [place] [address]   Medication will be filled on 01/15/24.  This fill date is pending response to refill request from provider. Patient is aware and if they have not received fill by intended date they must follow up with pharmacy.

## 2024-01-14 ENCOUNTER — Other Ambulatory Visit: Payer: Self-pay

## 2024-01-14 ENCOUNTER — Ambulatory Visit (HOSPITAL_COMMUNITY)
Admission: RE | Admit: 2024-01-14 | Discharge: 2024-01-14 | Disposition: A | Discharge status: Home / Self Care | Source: Ambulatory Visit | Attending: Nurse Practitioner | Admitting: Nurse Practitioner

## 2024-01-14 DIAGNOSIS — Z515 Encounter for palliative care: Secondary | ICD-10-CM | POA: Insufficient documentation

## 2024-01-14 DIAGNOSIS — C50411 Malignant neoplasm of upper-outer quadrant of right female breast: Secondary | ICD-10-CM | POA: Diagnosis not present

## 2024-01-14 DIAGNOSIS — R0602 Shortness of breath: Secondary | ICD-10-CM

## 2024-01-14 DIAGNOSIS — K449 Diaphragmatic hernia without obstruction or gangrene: Secondary | ICD-10-CM | POA: Diagnosis not present

## 2024-01-14 DIAGNOSIS — Z170 Estrogen receptor positive status [ER+]: Secondary | ICD-10-CM | POA: Diagnosis not present

## 2024-01-14 DIAGNOSIS — J90 Pleural effusion, not elsewhere classified: Secondary | ICD-10-CM | POA: Diagnosis not present

## 2024-01-14 NOTE — Progress Notes (Signed)
 Per chest x-ray pt needs thoracentesis. Orders placed and scheduled. Pt called and informed. All questions answered no further needs at this time.

## 2024-01-14 NOTE — Progress Notes (Signed)
 Pt called oncology c/o SOB, order placed for chest x-ray per provider. Pt made aware of order and will go get x-ray today. No further needs at this time.

## 2024-01-15 ENCOUNTER — Other Ambulatory Visit: Payer: Self-pay | Admitting: *Deleted

## 2024-01-15 DIAGNOSIS — J910 Malignant pleural effusion: Secondary | ICD-10-CM

## 2024-01-15 DIAGNOSIS — D649 Anemia, unspecified: Secondary | ICD-10-CM

## 2024-01-15 DIAGNOSIS — Z170 Estrogen receptor positive status [ER+]: Secondary | ICD-10-CM

## 2024-01-15 DIAGNOSIS — D61818 Other pancytopenia: Secondary | ICD-10-CM

## 2024-01-16 ENCOUNTER — Telehealth: Payer: Self-pay | Admitting: Adult Health

## 2024-01-16 ENCOUNTER — Inpatient Hospital Stay

## 2024-01-16 ENCOUNTER — Other Ambulatory Visit: Payer: Self-pay

## 2024-01-16 ENCOUNTER — Ambulatory Visit (HOSPITAL_COMMUNITY)
Admission: RE | Admit: 2024-01-16 | Discharge: 2024-01-16 | Disposition: A | Discharge status: Home / Self Care | Source: Ambulatory Visit | Attending: Nurse Practitioner | Admitting: Nurse Practitioner

## 2024-01-16 ENCOUNTER — Ambulatory Visit (HOSPITAL_COMMUNITY)
Admission: RE | Admit: 2024-01-16 | Discharge: 2024-01-16 | Disposition: A | Discharge status: Home / Self Care | Source: Ambulatory Visit | Attending: Radiology | Admitting: Radiology

## 2024-01-16 ENCOUNTER — Other Ambulatory Visit: Payer: Self-pay | Admitting: *Deleted

## 2024-01-16 VITALS — BP 127/73

## 2024-01-16 DIAGNOSIS — Z170 Estrogen receptor positive status [ER+]: Secondary | ICD-10-CM | POA: Diagnosis not present

## 2024-01-16 DIAGNOSIS — Z9011 Acquired absence of right breast and nipple: Secondary | ICD-10-CM | POA: Diagnosis not present

## 2024-01-16 DIAGNOSIS — J90 Pleural effusion, not elsewhere classified: Secondary | ICD-10-CM | POA: Insufficient documentation

## 2024-01-16 DIAGNOSIS — D61818 Other pancytopenia: Secondary | ICD-10-CM

## 2024-01-16 DIAGNOSIS — T451X5A Adverse effect of antineoplastic and immunosuppressive drugs, initial encounter: Secondary | ICD-10-CM | POA: Diagnosis not present

## 2024-01-16 DIAGNOSIS — Z5111 Encounter for antineoplastic chemotherapy: Secondary | ICD-10-CM | POA: Diagnosis not present

## 2024-01-16 DIAGNOSIS — D6481 Anemia due to antineoplastic chemotherapy: Secondary | ICD-10-CM | POA: Diagnosis not present

## 2024-01-16 DIAGNOSIS — C50411 Malignant neoplasm of upper-outer quadrant of right female breast: Secondary | ICD-10-CM | POA: Diagnosis not present

## 2024-01-16 DIAGNOSIS — C787 Secondary malignant neoplasm of liver and intrahepatic bile duct: Secondary | ICD-10-CM | POA: Diagnosis not present

## 2024-01-16 DIAGNOSIS — R0602 Shortness of breath: Secondary | ICD-10-CM | POA: Insufficient documentation

## 2024-01-16 DIAGNOSIS — Z515 Encounter for palliative care: Secondary | ICD-10-CM | POA: Insufficient documentation

## 2024-01-16 DIAGNOSIS — R918 Other nonspecific abnormal finding of lung field: Secondary | ICD-10-CM | POA: Diagnosis not present

## 2024-01-16 DIAGNOSIS — C7951 Secondary malignant neoplasm of bone: Secondary | ICD-10-CM | POA: Diagnosis not present

## 2024-01-16 DIAGNOSIS — Z79899 Other long term (current) drug therapy: Secondary | ICD-10-CM | POA: Diagnosis not present

## 2024-01-16 DIAGNOSIS — K449 Diaphragmatic hernia without obstruction or gangrene: Secondary | ICD-10-CM | POA: Diagnosis not present

## 2024-01-16 DIAGNOSIS — G893 Neoplasm related pain (acute) (chronic): Secondary | ICD-10-CM | POA: Diagnosis not present

## 2024-01-16 DIAGNOSIS — D649 Anemia, unspecified: Secondary | ICD-10-CM

## 2024-01-16 DIAGNOSIS — J910 Malignant pleural effusion: Secondary | ICD-10-CM

## 2024-01-16 LAB — CBC WITH DIFFERENTIAL (CANCER CENTER ONLY)
Abs Immature Granulocytes: 0 K/uL (ref 0.00–0.07)
Basophils Absolute: 0 K/uL (ref 0.0–0.1)
Basophils Relative: 2 %
Eosinophils Absolute: 0 K/uL (ref 0.0–0.5)
Eosinophils Relative: 3 %
HCT: 22.5 % — ABNORMAL LOW (ref 36.0–46.0)
Hemoglobin: 7.4 g/dL — ABNORMAL LOW (ref 12.0–15.0)
Immature Granulocytes: 0 %
Lymphocytes Relative: 12 %
Lymphs Abs: 0.2 K/uL — ABNORMAL LOW (ref 0.7–4.0)
MCH: 33.8 pg (ref 26.0–34.0)
MCHC: 32.9 g/dL (ref 30.0–36.0)
MCV: 102.7 fL — ABNORMAL HIGH (ref 80.0–100.0)
Monocytes Absolute: 0.2 K/uL (ref 0.1–1.0)
Monocytes Relative: 12 %
Neutro Abs: 1.2 K/uL — ABNORMAL LOW (ref 1.7–7.7)
Neutrophils Relative %: 71 %
Platelet Count: 57 K/uL — ABNORMAL LOW (ref 150–400)
RBC: 2.19 MIL/uL — ABNORMAL LOW (ref 3.87–5.11)
RDW: 14.8 % (ref 11.5–15.5)
WBC Count: 1.6 K/uL — ABNORMAL LOW (ref 4.0–10.5)
nRBC: 0 % (ref 0.0–0.2)

## 2024-01-16 LAB — CMP (CANCER CENTER ONLY)
ALT: 13 U/L (ref 0–44)
AST: 32 U/L (ref 15–41)
Albumin: 3.1 g/dL — ABNORMAL LOW (ref 3.5–5.0)
Alkaline Phosphatase: 356 U/L — ABNORMAL HIGH (ref 38–126)
Anion gap: 4 — ABNORMAL LOW (ref 5–15)
BUN: 13 mg/dL (ref 8–23)
CO2: 27 mmol/L (ref 22–32)
Calcium: 8.8 mg/dL — ABNORMAL LOW (ref 8.9–10.3)
Chloride: 109 mmol/L (ref 98–111)
Creatinine: 0.65 mg/dL (ref 0.44–1.00)
GFR, Estimated: >60 mL/min (ref >60)
Glucose, Bld: 122 mg/dL — ABNORMAL HIGH (ref 70–99)
Potassium: 4.2 mmol/L (ref 3.5–5.1)
Sodium: 140 mmol/L (ref 135–145)
Total Bilirubin: 0.4 mg/dL (ref 0.0–1.2)
Total Protein: 5.3 g/dL — ABNORMAL LOW (ref 6.5–8.1)

## 2024-01-16 LAB — SAMPLE TO BLOOD BANK

## 2024-01-16 LAB — PREPARE RBC (CROSSMATCH)

## 2024-01-16 MED ORDER — SODIUM CHLORIDE 0.9% FLUSH
10.0000 mL | Freq: Once | INTRAVENOUS | Status: AC | PRN
Start: 2024-01-16 — End: 2024-01-16
  Administered 2024-01-16: 10 mL

## 2024-01-16 MED ORDER — HEPARIN SOD (PORK) LOCK FLUSH 100 UNIT/ML IV SOLN
500.0000 [IU] | Freq: Once | INTRAVENOUS | Status: AC | PRN
  Administered 2024-01-16: 500 [IU]

## 2024-01-16 MED ORDER — LIDOCAINE HCL 1 % IJ SOLN
INTRAMUSCULAR | Status: AC
  Filled 2024-01-16: qty 20

## 2024-01-16 NOTE — Procedures (Signed)
 Ultrasound-guided therapeutic right thoracentesis performed yielding 450 cc of yellow  fluid. No immediate complications. Follow-up chest x-ray pending. EBL none.

## 2024-01-16 NOTE — Telephone Encounter (Signed)
 Called pt to make aware of 12pm appt for prbc on 7/10 at [facility]. Advised to keep blood band bracelet on. Pt verbalized understanding.

## 2024-01-17 ENCOUNTER — Inpatient Hospital Stay

## 2024-01-17 ENCOUNTER — Telehealth: Payer: Self-pay | Admitting: Nurse Practitioner

## 2024-01-17 DIAGNOSIS — T451X5A Adverse effect of antineoplastic and immunosuppressive drugs, initial encounter: Secondary | ICD-10-CM | POA: Diagnosis not present

## 2024-01-17 DIAGNOSIS — Z9011 Acquired absence of right breast and nipple: Secondary | ICD-10-CM | POA: Diagnosis not present

## 2024-01-17 DIAGNOSIS — Z170 Estrogen receptor positive status [ER+]: Secondary | ICD-10-CM | POA: Diagnosis not present

## 2024-01-17 DIAGNOSIS — C50411 Malignant neoplasm of upper-outer quadrant of right female breast: Secondary | ICD-10-CM | POA: Diagnosis not present

## 2024-01-17 DIAGNOSIS — C787 Secondary malignant neoplasm of liver and intrahepatic bile duct: Secondary | ICD-10-CM | POA: Diagnosis not present

## 2024-01-17 DIAGNOSIS — Z5111 Encounter for antineoplastic chemotherapy: Secondary | ICD-10-CM | POA: Diagnosis not present

## 2024-01-17 DIAGNOSIS — Z79899 Other long term (current) drug therapy: Secondary | ICD-10-CM | POA: Diagnosis not present

## 2024-01-17 DIAGNOSIS — C7951 Secondary malignant neoplasm of bone: Secondary | ICD-10-CM | POA: Diagnosis not present

## 2024-01-17 DIAGNOSIS — D6481 Anemia due to antineoplastic chemotherapy: Secondary | ICD-10-CM | POA: Diagnosis not present

## 2024-01-17 DIAGNOSIS — G893 Neoplasm related pain (acute) (chronic): Secondary | ICD-10-CM | POA: Diagnosis not present

## 2024-01-17 LAB — CANCER ANTIGEN 27.29: CA 27.29: 1092 U/mL — ABNORMAL HIGH (ref 0.0–38.6)

## 2024-01-17 MED ORDER — ACETAMINOPHEN 325 MG PO TABS
650.0000 mg | ORAL_TABLET | Freq: Once | ORAL | Status: AC
  Administered 2024-01-17: 650 mg via ORAL
  Filled 2024-01-17: qty 2

## 2024-01-17 MED ORDER — DIPHENHYDRAMINE HCL 25 MG PO CAPS
25.0000 mg | ORAL_CAPSULE | Freq: Once | ORAL | Status: AC
  Administered 2024-01-17: 25 mg via ORAL
  Filled 2024-01-17: qty 1

## 2024-01-17 NOTE — Patient Instructions (Signed)

## 2024-01-17 NOTE — Telephone Encounter (Signed)
 Scheduled appointment with the patient and she confirmed.

## 2024-01-18 LAB — TYPE AND SCREEN
ABO/RH(D): O POS
Antibody Screen: NEGATIVE
Unit division: 0

## 2024-01-18 LAB — BPAM RBC
Blood Product Expiration Date: 202508112359
ISSUE DATE / TIME: 202507101311
Unit Type and Rh: 5100

## 2024-01-22 ENCOUNTER — Other Ambulatory Visit (HOSPITAL_COMMUNITY): Payer: Self-pay

## 2024-01-22 ENCOUNTER — Ambulatory Visit

## 2024-01-23 ENCOUNTER — Encounter: Payer: Self-pay | Admitting: Physician Assistant

## 2024-01-23 ENCOUNTER — Ambulatory Visit (HOSPITAL_COMMUNITY)
Admission: RE | Admit: 2024-01-23 | Discharge: 2024-01-23 | Disposition: A | Discharge status: Home / Self Care | Source: Ambulatory Visit | Attending: Physician Assistant | Admitting: Physician Assistant

## 2024-01-23 ENCOUNTER — Other Ambulatory Visit (HOSPITAL_COMMUNITY): Payer: Self-pay

## 2024-01-23 ENCOUNTER — Encounter: Payer: Self-pay | Admitting: Hematology and Oncology

## 2024-01-23 ENCOUNTER — Other Ambulatory Visit: Payer: Self-pay

## 2024-01-23 ENCOUNTER — Other Ambulatory Visit: Payer: Self-pay | Admitting: Physician Assistant

## 2024-01-23 ENCOUNTER — Inpatient Hospital Stay (HOSPITAL_BASED_OUTPATIENT_CLINIC_OR_DEPARTMENT_OTHER): Admitting: Physician Assistant

## 2024-01-23 VITALS — BP 117/49 | HR 91 | Temp 99.2°F | Resp 16 | Wt 158.1 lb

## 2024-01-23 DIAGNOSIS — R109 Unspecified abdominal pain: Secondary | ICD-10-CM

## 2024-01-23 DIAGNOSIS — R918 Other nonspecific abnormal finding of lung field: Secondary | ICD-10-CM | POA: Diagnosis not present

## 2024-01-23 DIAGNOSIS — C7951 Secondary malignant neoplasm of bone: Secondary | ICD-10-CM | POA: Diagnosis not present

## 2024-01-23 DIAGNOSIS — J90 Pleural effusion, not elsewhere classified: Secondary | ICD-10-CM | POA: Diagnosis not present

## 2024-01-23 DIAGNOSIS — R0602 Shortness of breath: Secondary | ICD-10-CM | POA: Insufficient documentation

## 2024-01-23 DIAGNOSIS — C50411 Malignant neoplasm of upper-outer quadrant of right female breast: Secondary | ICD-10-CM | POA: Diagnosis not present

## 2024-01-23 DIAGNOSIS — Z170 Estrogen receptor positive status [ER+]: Secondary | ICD-10-CM

## 2024-01-23 DIAGNOSIS — C801 Malignant (primary) neoplasm, unspecified: Secondary | ICD-10-CM | POA: Diagnosis not present

## 2024-01-23 LAB — CBC WITH DIFFERENTIAL (CANCER CENTER ONLY)
Abs Immature Granulocytes: 0.02 K/uL (ref 0.00–0.07)
Basophils Absolute: 0 K/uL (ref 0.0–0.1)
Basophils Relative: 2 %
Eosinophils Absolute: 0.1 K/uL (ref 0.0–0.5)
Eosinophils Relative: 2 %
HCT: 24.6 % — ABNORMAL LOW (ref 36.0–46.0)
Hemoglobin: 8.2 g/dL — ABNORMAL LOW (ref 12.0–15.0)
Immature Granulocytes: 1 %
Lymphocytes Relative: 15 %
Lymphs Abs: 0.3 K/uL — ABNORMAL LOW (ref 0.7–4.0)
MCH: 33.5 pg (ref 26.0–34.0)
MCHC: 33.3 g/dL (ref 30.0–36.0)
MCV: 100.4 fL — ABNORMAL HIGH (ref 80.0–100.0)
Monocytes Absolute: 0.3 K/uL (ref 0.1–1.0)
Monocytes Relative: 16 %
Neutro Abs: 1.4 K/uL — ABNORMAL LOW (ref 1.7–7.7)
Neutrophils Relative %: 64 %
Platelet Count: 80 K/uL — ABNORMAL LOW (ref 150–400)
RBC: 2.45 MIL/uL — ABNORMAL LOW (ref 3.87–5.11)
RDW: 16.2 % — ABNORMAL HIGH (ref 11.5–15.5)
WBC Count: 2.2 K/uL — ABNORMAL LOW (ref 4.0–10.5)
nRBC: 0 % (ref 0.0–0.2)

## 2024-01-23 LAB — SAMPLE TO BLOOD BANK

## 2024-01-23 MED ORDER — FUROSEMIDE 20 MG PO TABS: 20.0000 mg | ORAL_TABLET | Freq: Every day | ORAL | 0 refills | Status: DC

## 2024-01-23 NOTE — Progress Notes (Signed)
 Chest xray from earlier today does not show significant pleural effusions. There is noted to be vascular congestion suggestive of edema. Patient does not have infectious symptoms to suggest pneumonia although she does have new BLE suggestive of being volume overloaded. Prescription sent to pharmacy for short course of lasix . Patient is scheduled to follow up with oncologist next week. Thoracentesis scheduled for tomorrow has been cancelled and patient has been updated on plan via MyChart message.

## 2024-01-23 NOTE — Progress Notes (Signed)
 Symptom Management Consult Note [facility]    Patient Care Team: [name], FNP as PCP - General (Family Medicine) Jerilynn Lamarr HERO, NP as PCP - Cardiology (Nurse Practitioner) Ethyl Lenis, MD as Consulting Physician (General Surgery) Dewey Rush, MD as Consulting Physician (Radiation Oncology) Laymon Soulier, MD as Consulting Physician (Pulmonary Disease) Kay Kemps, MD as Consulting Physician (Orthopedic Surgery) Duwayne Purchase, MD as Consulting Physician (Orthopedic Surgery) Kallie Medici, NP as Nurse Practitioner (Adult Health Nurse Practitioner) Glean Stephane BROCKS, RN (Inactive) as Oncology Nurse Navigator Tyree Nanetta SAILOR, RN as Oncology Nurse Navigator Arelia Filippo, MD as Consulting Physician (Plastic Surgery) Lavona Agent, MD as Consulting Physician (Cardiology) Loretha Ash, MD as Medical Oncologist (Hematology and Oncology) Pickenpack-Cousar, Fannie SAILOR, NP as Nurse Practitioner [facility] and Palliative Medicine)    Name / MRN / DOB: Doris Smith  [id number]  1951/03/08   Date of visit: 01/23/2024   Chief Complaint/Reason for visit: shortness of breath   Current Therapy: Verzenio      ASSESSMENT AND PLAN Patient is a 73 y.o. female with oncologic history of malignant neoplasm of upper-outer quadrant of right breast in female, estrogen receptor positivefollowed by Dr. Loretha.  I have viewed most recent oncology note and lab work.  #Malignant neoplasm of upper-outer quadrant of right breast in female, estrogen receptor positive  - Currently holding Verzenio  2/2 pancytopenia - Next appointment with oncologist is 01/28/24   #Shortness of breath - Chart review shows patient had: right thoracentesis 01/16/24 removing 450 cc, right thoracentesis 11/20/23 removing 500 cc, right thoracentesis 09/13/23 removing 405 cc Also had blood transfusion 01/17/24 after hemoglobin was 7.4 on 01/16/24 - Hemoglobin today is 8.2, will not  transfuse as she is >8.  - Lung exam reveals decreased lung sounds in right posterior base. Will evaluate with chest xray to see if thoracentesis is needed. Thoracentesis scheduled for tomorrow, will cancel if xray does not show pleural effusion.  #Right flank pain - Area of skin thickening and tenderness on right flank. No overlying erythema, no palpable abscess.  - Patient has know metastatic disease to ribs and liver, will evaluate this further with CT CAP without contrast because of allergy . Will have patient follow up with oncologist on 01/28/24 to discuss CT results and goals of care. - Patient and family agreeable with plan of care.  Strict ED precautions discussed should symptoms worsen.   HEME/ONC HISTORY Oncology History  Malignant neoplasm of upper-outer quadrant of right breast in female, estrogen receptor positive (HCC)  08/08/2018 Initial Diagnosis   status post right breast biopsy 08/02/2018 for a clinical T3 N0, stage IIA invasive lobular carcinoma, grade 2, estrogen receptor strongly positive, progesterone receptor 1% positive, with no HER-2 amplification and an MIB-1 of 1%.             (a) CT scan of the head and chest, without contrast 08/23/2018 showed nonspecific 0.4 cm left lower lobe pulmonary nodule, no definitive metastatic disease             (b) bone scan 08/23/2018 shows multiple spinal areas of abnormal uptake, but             (c) total spinal MRI 09/03/2018 finds bone scan findings to be secondary to degenerative disease, no evidence of metastatic disease.   08/2018 - 10/2022 Anti-estrogen oral therapy   Anastrozole ; discontinued 10/14/2018 in preparation for chemotherapy, resumed October 2020   09/16/2018 Surgery   Right mastectomy Jeoffrey) [phone]): Invasive Lobular Carcinoma, 10.5 cm, grade  2, negative margins. 1 of 5 lymph nodes positive for carcinoma.   09/16/2018 Miscellaneous   MammaPrint high risk suggests a 5-year metastasis free survival of 93% with  chemotherapy, with an absolute chemotherapy benefit in the greater than 12% range    09/16/2018 Miscellaneous   Caris testing on mastectomy sample (09/16/2018) showed stable MSI and proficient mismatch repair status, with a low mutational burden; BRCA 1 and 2 were not mutated, PI K3 was not mutated, ER B B2 was not mutated, and AKT 1 was not mutated. 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 Administration: 0.25 mg (11/05/2018), 0.25 mg (11/26/2018), 0.25 mg (12/17/2018), 0.25 mg (01/07/2019), 0.25 mg (01/28/2019), 0.25 mg (02/18/2019), 0.25 mg (03/11/2019), 0.25 mg (04/02/2019)  methotrexate  (PF) chemo injection 84 mg, 39.8 mg/m2 = 84.5 mg, Intravenous,  Once, 5 of 5 cycles. Administration: 84 mg (11/05/2018), 84 mg (11/26/2018), 84 mg (12/17/2018), 84 mg (03/11/2019), 84 mg (04/02/2019)  pegfilgrastim -cbqv (UDENYCA ) injection 6 mg, 6 mg, Subcutaneous, Once, 6 of 6 cycles. Administration: 6 mg (12/19/2018)  cyclophosphamide  (CYTOXAN ) 1,260 mg in sodium chloride  0.9 % 250 mL chemo infusion, 600 mg/m2 = 1,260 mg, Intravenous,  Once, 8 of 8 cycle. Administration: 1,260 mg (11/05/2018), 1,260 mg (11/26/2018), 1,260 mg (12/17/2018), 1,260 mg (01/07/2019), 1,260 mg (01/28/2019), 1,260 mg (02/18/2019), 1,260 mg (03/11/2019), 1,260 mg (04/02/2019)  fluorouracil  (ADRUCIL ) chemo injection 1,250 mg, 600 mg/m2 = 1,250 mg, Intravenous,  Once, 8 of 8 cycles. Administration: 1,250 mg (11/05/2018), 1,250 mg (11/26/2018), 1,250 mg (12/17/2018), 1,250 mg (01/07/2019), 1,250 mg (01/28/2019), 1,250 mg (02/18/2019), 1,250 mg (03/11/2019), 1,250 mg (04/02/2019).    12/31/2018 - 02/19/2019 Radiation Therapy   The patient initially received a dose of 50.4 Gy in 28 fractions to the chest wall and supraclavicular region. This was delivered using a 3-D conformal, 4 field  technique. The patient then received a boost to the mastectomy scar. This delivered an additional 10 Gy in 5 fractions using an en face electron field. The total dose was 60.4 Gy.   09/12/2022 Imaging   Bone scan on 09/12/2022 shows uptake in ribs, manubrium. Widespread osseous metastasis confirmed on PET scan that was completed on October 06, 2022. Right iliac crest biopsy demonstrated metastatic carcinoma consistent with patient's known breast carcinoma. ER 90% positive, PR 90% positive, Ki67 10%, HER2 negative.    10/10/2022 Treatment Plan Change   Faslodex  beginning 10/10/2022; Ibrance  beginning 12/20/2022; Zometa  every 12 weeks 11/07/2022    10/23/2022 - 11/03/2022 Radiation Therapy   Palliative Radiation 10/23/2022-11/03/2022:  left chest wall, lower T spine, and right proximal hip/pelvis were each treated to 30 GY in 10 fractions.        INTERVAL HISTORY  Discussed the use of AI scribe software for clinical note transcription with the patient, who gave verbal consent to proceed.    Doris Smith is a 73 y.o. female with oncologic history as above presenting to [facility] today with chief complaint of shortness of breath. She is accompanied by her spouse and sister.   She has been experiencing worsening shortness of breath since the weekend, occurring even at rest. She has a history of pleural effusions and has undergone thoracentesis in March, May, and most recently on July 9th. She received a blood transfusion on July 10th, but she reports that she did not  feel as good as she had after previous transfusions. No fever is present, but she experiences some coughing and wheezing. She has not used her inhaler recently, as her asthma has not been bothersome.  She reports dizziness and swelling in her feet, first noticed on Sunday. The swelling is intermittent and improves in the mornings, causing difficulty in removing her shoes. She sleeps in a reclined bed and uses a chair lift for mobility.  She has been  holding her Verzenio  for about two weeks ago. A hard spot on her right side has been bothersome for several months and has worsened over the last week or so. She uses a heating pad on her back for relief.       ROS  All other systems are reviewed and are negative for acute change except as noted in the HPI.    Allergies  Allergen Reactions   Iohexol     over 20 years ago had reaction hurting in arm and heart-Done in [place], [place].SABRAphysician stopped CT at that time.   Codeine Other (See Comments)    Makes feel like she is wild    Palonosetron  Other (See Comments)    headache   Prednisone  Other (See Comments)    Makes me very irritable   Sulfonamide Derivatives Other (See Comments)    Upset stomach   Celecoxib Nausea And Vomiting   Sulfa Antibiotics Nausea And Vomiting   Vitamin B12 Rash     Past Medical History:  Diagnosis Date   A-fib (HCC) 11/2012   PAF   Anxiety    takes Ativan    Asthma 04/07/2011   dx   Bipolar disorder (HCC)    Cancer (HCC)    Right breast   Depression    Early cataracts, bilateral    Fatty liver    Fibromyalgia    GERD (gastroesophageal reflux disease)    Irritable bowel syndrome    Mental disorder    dx bipolar   PONV (postoperative nausea and vomiting)    Sleep apnea 04/2011   does not wear CPAP   Tardive dyskinesia      Past Surgical History:  Procedure Laterality Date   ABDOMINAL HYSTERECTOMY     BIOPSY  08/17/2023   Procedure: BIOPSY;  Surgeon: Avram Lupita BRAVO, MD;  Location: [facility];  Service: Gastroenterology;;   COLONOSCOPY     DILATION AND CURETTAGE OF UTERUS  1981   abnormal pap   ESOPHAGOGASTRODUODENOSCOPY (EGD) WITH PROPOFOL  N/A 08/17/2023   Procedure: ESOPHAGOGASTRODUODENOSCOPY (EGD) WITH PROPOFOL ;  Surgeon: Avram Lupita BRAVO, MD;  Location: [facility];  Service: Gastroenterology;  Laterality: N/A;   HOT HEMOSTASIS N/A 08/17/2023   Procedure: HOT HEMOSTASIS (ARGON PLASMA COAGULATION/BICAP);  Surgeon: Avram Lupita BRAVO,  MD;  Location: [facility];  Service: Gastroenterology;  Laterality: N/A;   IR IMAGING GUIDED PORT INSERTION  11/20/2023   IR THORACENTESIS ASP PLEURAL SPACE W/IMG GUIDE  09/13/2023   IR THORACENTESIS ASP PLEURAL SPACE W/IMG GUIDE  11/20/2023   KNEE ARTHROSCOPY Left 2007   x 2   LUMBAR LAMINECTOMY/DECOMPRESSION MICRODISCECTOMY  05/31/2011   Procedure: LUMBAR LAMINECTOMY/DECOMPRESSION MICRODISCECTOMY;  Surgeon: Reyes JAYSON Billing;  Location: [facility];  Service: Orthopedics;  Laterality: Left;  Decompression Lumbar four to five and  Lumbar five to Sacral one on Left  (X-Ray)   MASTECTOMY W/ SENTINEL NODE BIOPSY Right 09/16/2018   Procedure: RIGHT MASTECTOMY WITH RIGHT AXILLARY SENTINEL LYMPH NODE BIOPSY;  Surgeon: Ethyl Lenis, MD;  Location: [facility];  Service: General;  Laterality:  Right;   PARTIAL HYSTERECTOMY  1982   PORTACATH PLACEMENT Left 10/31/2018   Procedure: INSERTION PORT-A-CATH WITH ULTRASOUND;  Surgeon: Ethyl Lenis, MD;  Location: [facility];  Service: General;  Laterality: Left;   TONSILLECTOMY     as child   TOTAL KNEE ARTHROPLASTY Left 12/18/2005    Social History   Socioeconomic History   Marital status: Married    Spouse name: Lynwood   Number of children: 2   Years of education: 12   Highest education level: Not on file  Occupational History   Occupation: Disabled  Tobacco Use   Smoking status: Never   Smokeless tobacco: Never  Vaping Use   Vaping status: Never Used  Substance and Sexual Activity   Alcohol use: No   Drug use: No   Sexual activity: Yes    Birth control/protection: Post-menopausal, Surgical  Other Topics Concern   Not on file  Social History Narrative   Tea daily.  Rarely has caffeine    Social Drivers of Corporate investment banker Strain: Patient Declined (10/22/2023)   Overall Financial Resource Strain (CARDIA)    Difficulty of Paying Living Expenses: Patient declined  Food Insecurity: Patient Declined (10/22/2023)   Hunger Vital  Sign    Worried About Running Out of Food in the Last Year: Patient declined    Ran Out of Food in the Last Year: Patient declined  Transportation Needs: No Transportation Needs (10/22/2023)   PRAPARE - Administrator, Civil Service (Medical): No    Lack of Transportation (Non-Medical): No  Physical Activity: Unknown (10/22/2023)   Exercise Vital Sign    Days of Exercise per Week: 0 days    Minutes of Exercise per Session: Not on file  Stress: Stress Concern Present (10/22/2023)   [employer] of Occupational Health - Occupational Stress Questionnaire    Feeling of Stress : To some extent  Social Connections: Unknown (10/22/2023)   Social Connection and Isolation Panel    Frequency of Communication with Friends and Family: More than three times a week    Frequency of Social Gatherings with Friends and Family: Twice a week    Attends Religious Services: Patient declined    Database administrator or Organizations: No    Attends Banker Meetings: Never    Marital Status: Married  Catering manager Violence: Not At Risk (08/16/2023)   Humiliation, Afraid, Rape, and Kick questionnaire    Fear of Current or Ex-Partner: No    Emotionally Abused: No    Physically Abused: No    Sexually Abused: No    Family History  Problem Relation Age of Onset   Anesthesia problems Mother    Heart disease Mother        CHF, atrial fib   Heart failure Mother    Anesthesia problems Sister    Colon polyps Father    Heart disease Father        Fluid around the heart   Hypertension Sister    Breast cancer Maternal Aunt    Colon cancer Maternal Aunt    Prostate cancer Neg Hx    Ovarian cancer Neg Hx      Current Outpatient Medications:    abemaciclib  (VERZENIO ) 100 MG tablet, Take 1 tablet (100 mg total) by mouth 2 (two) times daily., Disp: 56 tablet, Rfl: 0   ciprofloxacin  (CIPRO ) 500 MG tablet, Take 500 mg by mouth 2 (two) times daily., Disp: , Rfl:    cyanocobalamin   1000 MCG tablet, Take 1 tablet (1,000 mcg total) by mouth daily., Disp: 60 tablet, Rfl: 0   folic acid  (FOLVITE ) 1 MG tablet, Take 1 tablet (1 mg total) by mouth daily. (Patient not taking: Reported on 10/23/2023), Disp: 60 tablet, Rfl: 0   Incontinence Supply Disposable (CERTAINTY FITTED BRIEFS XL) MISC, 1 Package by Does not apply route as needed., Disp: 56 each, Rfl: 3   lidocaine  (LIDODERM ) 5 %, Place 2 patches onto the skin daily. Remove & Discard patch within 12 hours or as directed by MD, Disp: 60 patch, Rfl: 2   LORazepam  (ATIVAN ) 1 MG tablet, Take 1 tablet (1 mg total) by mouth 2 (two) times daily as needed for anxiety., Disp: 60 tablet, Rfl: 5   OLANZapine  (ZYPREXA ) 5 MG tablet, Take 1 tablet (5 mg total) by mouth at bedtime., Disp: 30 tablet, Rfl: 1   ondansetron  (ZOFRAN ) 8 MG tablet, Take 1 tablet (8 mg total) by mouth every 8 (eight) hours as needed for nausea or vomiting., Disp: 30 tablet, Rfl: 5   oxyCODONE  (OXYCONTIN ) 15 mg 12 hr tablet, Take 1 tablet (15 mg total) by mouth every 12 (twelve) hours., Disp: 60 tablet, Rfl: 0   oxyCODONE -acetaminophen  (PERCOCET) 10-325 MG tablet, Take 1 tablet by mouth every 4 (four) hours as needed for pain., Disp: 60 tablet, Rfl: 0   pantoprazole  (PROTONIX ) 40 MG tablet, Take 1 tablet (40 mg total) by mouth 2 (two) times daily before a meal., Disp: 180 tablet, Rfl: 1   phenazopyridine  (PYRIDIUM ) 200 MG tablet, Take 1 tablet (200 mg total) by mouth 3 (three) times daily as needed for pain., Disp: 10 tablet, Rfl: 0   polyethylene glycol (MIRALAX / GLYCOLAX) 17 g packet, Take 17 g by mouth daily., Disp: , Rfl:    pramipexole  (MIRAPEX ) 1 MG tablet, Take 1 tablet (1 mg total) by mouth 3 (three) times daily., Disp: 90 tablet, Rfl: 1   prochlorperazine  (COMPAZINE ) 10 MG tablet, Take 1 tablet (10 mg total) by mouth every 6 (six) hours as needed for nausea or vomiting., Disp: 30 tablet, Rfl: 2  PHYSICAL EXAM ECOG FS:1 - Symptomatic but completely ambulatory     Vitals:   01/23/24 1357  BP: (!) 117/49  Pulse: 91  Resp: 16  Temp: 99.2 F (37.3 C)  TempSrc: Temporal  SpO2: 100%  Weight: 158 lb 1.6 oz (71.7 kg)   Physical Exam Vitals and nursing note reviewed.  Constitutional:      Appearance: She is not ill-appearing or toxic-appearing.  HENT:     Head: Normocephalic.  Eyes:     Conjunctiva/sclera: Conjunctivae normal.  Cardiovascular:     Rate and Rhythm: Normal rate and regular rhythm.     Pulses: Normal pulses.     Heart sounds: Normal heart sounds.  Pulmonary:     Effort: Pulmonary effort is normal.     Comments: Decreased lung sounds right lung base Abdominal:     General: There is no distension.   Musculoskeletal:     Cervical back: Normal range of motion.     Right lower leg: Edema present.     Left lower leg: Edema present.  Skin:    General: Skin is warm and dry.  Neurological:     Mental Status: She is alert.        LABORATORY DATA I have reviewed the data as listed    Latest Ref Rng & Units 01/23/2024    1:53 PM 01/16/2024    1:42 PM 01/09/2024  1:27 PM  CBC  WBC 4.0 - 10.5 K/uL 2.2  1.6  1.7   Hemoglobin 12.0 - 15.0 g/dL 8.2  7.4  8.2   Hematocrit 36.0 - 46.0 % 24.6  22.5  25.3   Platelets 150 - 400 K/uL 80  57  57         Latest Ref Rng & Units 01/16/2024    1:42 PM 01/09/2024    1:27 PM 12/27/2023    9:38 AM  CMP  Glucose 70 - 99 mg/dL 877  872  890   BUN 8 - 23 mg/dL 13  11  13    Creatinine 0.44 - 1.00 mg/dL 9.34  9.25  9.28   Sodium 135 - 145 mmol/L 140  141  141   Potassium 3.5 - 5.1 mmol/L 4.2  4.0  4.0   Chloride 98 - 111 mmol/L 109  109  108   CO2 22 - 32 mmol/L 27  27  27    Calcium 8.9 - 10.3 mg/dL 8.8  8.8  9.0   Total Protein 6.5 - 8.1 g/dL 5.3  5.6  5.8   Total Bilirubin 0.0 - 1.2 mg/dL 0.4  0.4  0.6   Alkaline Phos 38 - 126 U/L 356  395  350   AST 15 - 41 U/L 32  28  34   ALT 0 - 44 U/L 13  12  16         RADIOGRAPHIC STUDIES (from last 24 hours if applicable) I have personally  reviewed the radiological images as listed and agreed with the findings in the report. No results found.      Visit Diagnosis: 1. Malignant neoplasm of upper-outer quadrant of right breast in female, estrogen receptor positive (HCC)   2. Shortness of breath   3. Acute right flank pain      Orders Placed This Encounter  Procedures   US  THORACENTESIS ASP PLEURAL SPACE W/IMG GUIDE    Standing Status:   Future    Expected Date:   01/23/2024    Expiration Date:   01/22/2025    Are labs required for specimen collection?:   No    Reason for Exam (SYMPTOM  OR DIAGNOSIS REQUIRED):   SOB    Preferred imaging location?:   [facility]   DG Chest 2 View    Standing Status:   Future    Number of Occurrences:   1    Expected Date:   01/23/2024    Expiration Date:   01/22/2025    Reason for Exam (SYMPTOM  OR DIAGNOSIS REQUIRED):   SOB    Preferred imaging location?:   [facility]   CT CHEST ABDOMEN PELVIS WO CONTRAST    Standing Status:   Future    Expected Date:   01/23/2024    Expiration Date:   01/22/2025    Preferred imaging location?:   [facility]    If indicated for the ordered procedure, I authorize the administration of oral contrast media per Radiology protocol:   Yes    Does the patient have a contrast media/X-ray dye allergy ?:   No    Call Results- Best Contact Number?:   [phone] / do not hold patient    All questions were answered. The patient knows to call the clinic with any problems, questions or concerns. No barriers to learning was detected.  A total of more than 30 minutes were spent on this encounter with face-to-face time and non-face-to-face time,  including preparing to see the patient, ordering tests and/or medications, counseling the patient and coordination of care as outlined above.    Thank you for allowing me to participate in the care of this patient.    Cyntha Brickman E  Walisiewicz, PA-C Department of Hematology/Oncology [facility] Phone: [phone]  [phone]    01/23/2024 4:17 PM

## 2024-01-24 ENCOUNTER — Ambulatory Visit (HOSPITAL_COMMUNITY)

## 2024-01-27 ENCOUNTER — Ambulatory Visit (HOSPITAL_BASED_OUTPATIENT_CLINIC_OR_DEPARTMENT_OTHER)
Admission: RE | Admit: 2024-01-27 | Discharge: 2024-01-27 | Disposition: A | Discharge status: Home / Self Care | Source: Ambulatory Visit | Attending: Physician Assistant

## 2024-01-27 DIAGNOSIS — R109 Unspecified abdominal pain: Secondary | ICD-10-CM | POA: Diagnosis not present

## 2024-01-27 DIAGNOSIS — Z170 Estrogen receptor positive status [ER+]: Secondary | ICD-10-CM | POA: Insufficient documentation

## 2024-01-27 DIAGNOSIS — K8689 Other specified diseases of pancreas: Secondary | ICD-10-CM | POA: Diagnosis not present

## 2024-01-27 DIAGNOSIS — C50411 Malignant neoplasm of upper-outer quadrant of right female breast: Secondary | ICD-10-CM | POA: Insufficient documentation

## 2024-01-27 DIAGNOSIS — R0602 Shortness of breath: Secondary | ICD-10-CM | POA: Diagnosis not present

## 2024-01-27 DIAGNOSIS — C787 Secondary malignant neoplasm of liver and intrahepatic bile duct: Secondary | ICD-10-CM | POA: Diagnosis not present

## 2024-01-27 DIAGNOSIS — C7951 Secondary malignant neoplasm of bone: Secondary | ICD-10-CM | POA: Diagnosis not present

## 2024-01-28 ENCOUNTER — Inpatient Hospital Stay (HOSPITAL_BASED_OUTPATIENT_CLINIC_OR_DEPARTMENT_OTHER): Admitting: Nurse Practitioner

## 2024-01-28 ENCOUNTER — Inpatient Hospital Stay

## 2024-01-28 ENCOUNTER — Other Ambulatory Visit: Payer: Self-pay

## 2024-01-28 ENCOUNTER — Encounter: Payer: Self-pay | Admitting: Nurse Practitioner

## 2024-01-28 ENCOUNTER — Encounter: Payer: Self-pay | Admitting: Hematology and Oncology

## 2024-01-28 ENCOUNTER — Inpatient Hospital Stay (HOSPITAL_BASED_OUTPATIENT_CLINIC_OR_DEPARTMENT_OTHER): Admitting: Hematology and Oncology

## 2024-01-28 VITALS — BP 124/55 | HR 79 | Temp 97.0°F | Resp 18 | Wt 156.4 lb

## 2024-01-28 DIAGNOSIS — R0602 Shortness of breath: Secondary | ICD-10-CM

## 2024-01-28 DIAGNOSIS — G893 Neoplasm related pain (acute) (chronic): Secondary | ICD-10-CM | POA: Diagnosis not present

## 2024-01-28 DIAGNOSIS — C50411 Malignant neoplasm of upper-outer quadrant of right female breast: Secondary | ICD-10-CM | POA: Diagnosis not present

## 2024-01-28 DIAGNOSIS — Z515 Encounter for palliative care: Secondary | ICD-10-CM | POA: Diagnosis not present

## 2024-01-28 DIAGNOSIS — Z79899 Other long term (current) drug therapy: Secondary | ICD-10-CM | POA: Diagnosis not present

## 2024-01-28 DIAGNOSIS — C787 Secondary malignant neoplasm of liver and intrahepatic bile duct: Secondary | ICD-10-CM | POA: Diagnosis not present

## 2024-01-28 DIAGNOSIS — Z7189 Other specified counseling: Secondary | ICD-10-CM

## 2024-01-28 DIAGNOSIS — R530 Neoplastic (malignant) related fatigue: Secondary | ICD-10-CM

## 2024-01-28 DIAGNOSIS — Z9011 Acquired absence of right breast and nipple: Secondary | ICD-10-CM | POA: Diagnosis not present

## 2024-01-28 DIAGNOSIS — Z170 Estrogen receptor positive status [ER+]: Secondary | ICD-10-CM

## 2024-01-28 DIAGNOSIS — T451X5A Adverse effect of antineoplastic and immunosuppressive drugs, initial encounter: Secondary | ICD-10-CM | POA: Diagnosis not present

## 2024-01-28 DIAGNOSIS — C7951 Secondary malignant neoplasm of bone: Secondary | ICD-10-CM | POA: Diagnosis not present

## 2024-01-28 DIAGNOSIS — J910 Malignant pleural effusion: Secondary | ICD-10-CM

## 2024-01-28 DIAGNOSIS — D6481 Anemia due to antineoplastic chemotherapy: Secondary | ICD-10-CM | POA: Diagnosis not present

## 2024-01-28 DIAGNOSIS — Z5111 Encounter for antineoplastic chemotherapy: Secondary | ICD-10-CM | POA: Diagnosis not present

## 2024-01-28 LAB — CBC WITH DIFFERENTIAL (CANCER CENTER ONLY)
Abs Immature Granulocytes: 0.03 K/uL (ref 0.00–0.07)
Basophils Absolute: 0.1 K/uL (ref 0.0–0.1)
Basophils Relative: 2 %
Eosinophils Absolute: 0.1 K/uL (ref 0.0–0.5)
Eosinophils Relative: 2 %
HCT: 23.9 % — ABNORMAL LOW (ref 36.0–46.0)
Hemoglobin: 7.7 g/dL — ABNORMAL LOW (ref 12.0–15.0)
Immature Granulocytes: 1 %
Lymphocytes Relative: 10 %
Lymphs Abs: 0.3 K/uL — ABNORMAL LOW (ref 0.7–4.0)
MCH: 32.6 pg (ref 26.0–34.0)
MCHC: 32.2 g/dL (ref 30.0–36.0)
MCV: 101.3 fL — ABNORMAL HIGH (ref 80.0–100.0)
Monocytes Absolute: 0.4 K/uL (ref 0.1–1.0)
Monocytes Relative: 12 %
Neutro Abs: 2.3 K/uL (ref 1.7–7.7)
Neutrophils Relative %: 73 %
Platelet Count: 121 K/uL — ABNORMAL LOW (ref 150–400)
RBC: 2.36 MIL/uL — ABNORMAL LOW (ref 3.87–5.11)
RDW: 16.4 % — ABNORMAL HIGH (ref 11.5–15.5)
WBC Count: 3.2 K/uL — ABNORMAL LOW (ref 4.0–10.5)
nRBC: 0.6 % — ABNORMAL HIGH (ref 0.0–0.2)

## 2024-01-28 LAB — CMP (CANCER CENTER ONLY)
ALT: 14 U/L (ref 0–44)
AST: 38 U/L (ref 15–41)
Albumin: 3.1 g/dL — ABNORMAL LOW (ref 3.5–5.0)
Alkaline Phosphatase: 276 U/L — ABNORMAL HIGH (ref 38–126)
Anion gap: 4 — ABNORMAL LOW (ref 5–15)
BUN: 11 mg/dL (ref 8–23)
CO2: 28 mmol/L (ref 22–32)
Calcium: 8.6 mg/dL — ABNORMAL LOW (ref 8.9–10.3)
Chloride: 106 mmol/L (ref 98–111)
Creatinine: 0.6 mg/dL (ref 0.44–1.00)
GFR, Estimated: >60 mL/min (ref >60)
Glucose, Bld: 119 mg/dL — ABNORMAL HIGH (ref 70–99)
Potassium: 4.1 mmol/L (ref 3.5–5.1)
Sodium: 138 mmol/L (ref 135–145)
Total Bilirubin: 0.4 mg/dL (ref 0.0–1.2)
Total Protein: 5.5 g/dL — ABNORMAL LOW (ref 6.5–8.1)

## 2024-01-28 MED ORDER — SODIUM CHLORIDE 0.9% FLUSH
10.0000 mL | Freq: Once | INTRAVENOUS | Status: AC | PRN
  Administered 2024-01-28: 10 mL

## 2024-01-28 MED ORDER — HEPARIN SOD (PORK) LOCK FLUSH 100 UNIT/ML IV SOLN
500.0000 [IU] | Freq: Once | INTRAVENOUS | Status: AC | PRN
  Administered 2024-01-28: 500 [IU]

## 2024-01-28 NOTE — Assessment & Plan Note (Signed)
 Assessment and Plan Assessment & Plan  Stage 4 Breast Cancer with bone, liver, gastrointestinal disease. progression despite Verzenio . PET scan: mixed response, bone improvement, new liver metastases. Current treatment inadequate. Discussed options: increase Verzenio , switch to Orserdu, Truqap with faslodex , Xeloda. Risks.  - She wanted to try higher dose of verzenio  before trying other options. - She is here on 100 mg PO BID

## 2024-01-28 NOTE — Progress Notes (Signed)
 Clinical Intervention Note  Clinical Intervention Notes: I spoke with the patient's sister, who asked whether Verzenio  could be split in half. I informed her that splitting Verzenio  is not recommended and advised that a 50 mg tablet is available, which the provider could prescribe while her dose is being adjusted. She mentioned that she has already contacted the provider's office today and will wait for their response.   Clinical Intervention Outcomes: Improved therapy effectiveness; Improved therapy adherence   Silvano LOISE Dolly Specialty Pharmacist

## 2024-01-28 NOTE — Progress Notes (Signed)
 Palliative Medicine [facility]  [phone] [phone]   Name: Doris Smith Date: 01/28/2024 MRN: [id number]  DOB: [birth year]  Patient Care Team: [name], FNP as PCP - General (Family Medicine) Jerilynn Lamarr HERO, NP as PCP - Cardiology (Nurse Practitioner) Ethyl Lenis, MD as Consulting Physician (General Surgery) Dewey Rush, MD as Consulting Physician (Radiation Oncology) Laymon Soulier, MD as Consulting Physician (Pulmonary Disease) Kay Kemps, MD as Consulting Physician (Orthopedic Surgery) Duwayne Purchase, MD as Consulting Physician (Orthopedic Surgery) Kallie Medici, NP as Nurse Practitioner (Adult Health Nurse Practitioner) Glean Stephane BROCKS, RN (Inactive) as Oncology Nurse Navigator Tyree Nanetta SAILOR, RN as Oncology Nurse Navigator Arelia Filippo, MD as Consulting Physician (Plastic Surgery) Lavona Agent, MD as Consulting Physician (Cardiology) Loretha Ash, MD as Medical Oncologist (Hematology and Oncology) Pickenpack-Cousar, Fannie SAILOR, NP as Nurse Practitioner (Hospice and Palliative Medicine)    INTERVAL HISTORY: Doris Smith is a 73 y.o. female with oncologic medical history including estrogen receptor positive breast cancer(07/2018) s/p right mastectomy. PET scan 10/06/22 showing widespread osseous metastasis. Other pertinent history includes atrial fibrillation, asthma, OSA, GERD, fatty liver, fibromyalgia, osteoporosis, chronic headaches, anxiety, depression, and bipolar disorder. Underwent ORIF of right hip 09/20/2022. Palliative ask to see for symptom and pain management and goals of care.   SOCIAL HISTORY:     reports that she has never smoked. She has never used smokeless tobacco. She reports that she does not drink alcohol and does not use drugs.  ADVANCE DIRECTIVES:  On file  CODE STATUS: Full code  PAST MEDICAL HISTORY: Past Medical History:  Diagnosis Date   A-fib (HCC) 11/2012    PAF   Anxiety    takes Ativan    Asthma 04/07/2011   dx   Bipolar disorder (HCC)    Cancer (HCC)    Right breast   Depression    Early cataracts, bilateral    Fatty liver    Fibromyalgia    GERD (gastroesophageal reflux disease)    Irritable bowel syndrome    Mental disorder    dx bipolar   PONV (postoperative nausea and vomiting)    Sleep apnea 04/2011   does not wear CPAP   Tardive dyskinesia     ALLERGIES:  is allergic to iohexol, codeine, palonosetron , prednisone , sulfonamide derivatives, celecoxib, sulfa antibiotics, and vitamin b12.  MEDICATIONS:  Current Outpatient Medications  Medication Sig Dispense Refill   abemaciclib  (VERZENIO ) 100 MG tablet Take 1 tablet (100 mg total) by mouth 2 (two) times daily. 56 tablet 0   ciprofloxacin  (CIPRO ) 500 MG tablet Take 500 mg by mouth 2 (two) times daily.     cyanocobalamin  1000 MCG tablet Take 1 tablet (1,000 mcg total) by mouth daily. 60 tablet 0   folic acid  (FOLVITE ) 1 MG tablet Take 1 tablet (1 mg total) by mouth daily. (Patient not taking: Reported on 10/23/2023) 60 tablet 0   furosemide  (LASIX ) 20 MG tablet Take 1 tablet (20 mg total) by mouth daily with breakfast for 7 days. 7 tablet 0   Incontinence Supply Disposable (CERTAINTY FITTED BRIEFS XL) MISC 1 Package by Does not apply route as needed. 56 each 3   lidocaine  (LIDODERM ) 5 % Place 2 patches onto the skin daily. Remove & Discard patch within 12 hours or as directed by MD 60 patch 2   LORazepam  (ATIVAN ) 1 MG tablet Take 1 tablet (1 mg total) by mouth 2 (two) times daily as needed for anxiety. 60 tablet 5   OLANZapine  (  ZYPREXA ) 5 MG tablet Take 1 tablet (5 mg total) by mouth at bedtime. 30 tablet 1   ondansetron  (ZOFRAN ) 8 MG tablet Take 1 tablet (8 mg total) by mouth every 8 (eight) hours as needed for nausea or vomiting. 30 tablet 5   oxyCODONE  (OXYCONTIN ) 15 mg 12 hr tablet Take 1 tablet (15 mg total) by mouth every 12 (twelve) hours. 60 tablet 0    oxyCODONE -acetaminophen  (PERCOCET) 10-325 MG tablet Take 1 tablet by mouth every 4 (four) hours as needed for pain. 60 tablet 0   pantoprazole  (PROTONIX ) 40 MG tablet Take 1 tablet (40 mg total) by mouth 2 (two) times daily before a meal. 180 tablet 1   phenazopyridine  (PYRIDIUM ) 200 MG tablet Take 1 tablet (200 mg total) by mouth 3 (three) times daily as needed for pain. 10 tablet 0   polyethylene glycol (MIRALAX / GLYCOLAX) 17 g packet Take 17 g by mouth daily.     pramipexole  (MIRAPEX ) 1 MG tablet Take 1 tablet (1 mg total) by mouth 3 (three) times daily. 90 tablet 1   prochlorperazine  (COMPAZINE ) 10 MG tablet Take 1 tablet (10 mg total) by mouth every 6 (six) hours as needed for nausea or vomiting. 30 tablet 2   No current facility-administered medications for this visit.    VITAL SIGNS: There were no vitals taken for this visit. There were no vitals filed for this visit.   Estimated body mass index is 26.84 kg/m as calculated from the following:   Height as of 01/09/24: 5' 4 (1.626 m).   Weight as of an earlier encounter on 01/28/24: 156 lb 6 oz (70.9 kg).   PERFORMANCE STATUS (ECOG) : 1 - Symptomatic but completely ambulatory  Physical Exam General: NAD Cardiovascular: regular rate and rhythm Pulmonary: normal breathing pattern Extremities: no edema, no joint deformities Skin: no rashes Neurological: AAO x3  IMPRESSION: Discussed the use of AI scribe software for clinical note transcription with the patient, who gave verbal consent to proceed.  History of Present Illness Doris Smith is a 73 year old female with cancer who presents for follow-up. No acute distress. She is accompanied by her husband and sister, Doris Smith.  Patient in wheelchair. Her appetite fluctuates. Some days are better than others and she eats small amounts to avoid nausea. Despite this her weight is up to 156lbs. She has not experienced any recent episodes of dry heaving, which previously affected  her appetite.  She experiences significant shortness of breath, which has been ongoing. During a recent attempt at a six-minute walk test, she was unable to complete it, needing to stop after two to three minutes due to breathlessness. She can walk short distances but not for six minutes continuously without assistance. At home, she uses a wheelchair for support when walking. During ambulation test patient's oxygen saturations dropped to 87%. Patient with observable signs of dyspneic which caused her feelings of anxiousness.   She describes episodes of panic attacks, which she has experienced for years and are exacerbated by her difficulty breathing, particularly when lying down, sitting in a recliner, or eating, as she feels like she is 'smothering to death.'   Recently Doris Smith has been challenged with recurrent malignant pleural effusions as shown by previous chest x-rays. A recent CT scan showed the presence of fluid in her lungs. Patient with recent thoracentesis 09/13/2023 yielding 450 mL, 11/20/2023 yielding 500 mL, 12/28/2023 yielding 425 mL, and 01/16/2024 yielding 450 mL.  Education provided to patient on etiology  of pleural effusion in addition to possibility of requiring Pleurx catheter if fluid continues to build up frequently.  She and family verbalized understanding.  Goals of Care Doris Smith is emotional expressing her willingness to live. She states clearly her goal is to continue to treat the treatable allow her every opportunity to continue to thrive.  She speaks to her wishes of returning back to her home which is currently under renovations.  Patient states she is excepting a passing away when God calls her home however she is hopeful that this time is not near.  Emotional support provided.  Patient and family continue to take things one day at a time.  11/30/22: Doris Smith is emotional. Her husband becomes emotional as patient shares she knows her health is not the best and her cancer is not  curable. She is taking life one day at time. Speaks to when her time comes she will be ready but hopeful it is not now. She knows that no one has a definitive time frame. Expresses her appreciation in all of the care and support. Expresses her goal is to continue to treat the treatable allowing her the ability to continue to thrive while not suffering.    4/30: Since the night of 10/30/22, Doris Smith has continued to meet goal of lying in her own bed at night beside her husband. She expresses appreciation for the care of everyone at the cancer center and is hopeful for continued improvement.  I discussed the importance of continued conversation with family and their medical providers regarding overall plan of care and treatment options, ensuring decisions are within the context of the patients values and GOCs. Assessment & Plan  Nausea Improved. No recent concerns. Denies episodes of dry heaving.  - Continue anti-nausea medication every eight hours as needed.   Fatigue Ongoing fatigue possibly related to anemia and chemotherapy. - Encourage gradual increase in physical activity as tolerated.  Cancer related pain Management Pain controlled with Tylenol , oxycodone , OxyContin , and lidocaine  patches.  No adjustments to current regimen at this time. - Continue OxyContin  15 mg every 12 hours.   -Continue lidocaine  patches as needed. - Continue Percocet 10/325 mg 1 tablet every 4 hours as needed for severe/breakthrough pain. Only requiring 1-2 times during the week.    Pleural effusion Thoracentesis on June 24th removed 425 mL. Breathing difficulties persist, no rapid fluid reaccumulation. - Monitor respiratory symptoms and consider repeat thoracentesis if symptoms worsen.  Goals of Care Focused on maintaining health and progress, not willing to change treatment significantly. Aims to return home and enjoy life with husband. - Support her goal of maintaining current health status and avoiding  regression.  Shortness of breath due to bilateral pleural effusion Chronic shortness of breath exacerbated by pulmonary fluid accumulation. Imaging confirmed fluid presence, insufficient for drainage. Oxygen saturation dropped to 87-89% during ambulation, indicating significant respiratory compromise. Symptoms worsen in sitting positions, possibly due to lung compression. - Initiate home oxygen therapy 2L/[place]. [place],RN to arrange delivery and setup. - Ensure communication with home delivery company for oxygen setup via text and phone call. - Education provided on use of Pleurx catheter in the event of persistent recurrent malignant pleural effusions.  Panic attacks Panic attacks exacerbated by episodes of dyspnea and suffocation sensation, occurring in recumbent, sitting, or eating positions, likely due to perceived respiratory distress.  -I will plan to see patient back in 3-4 weeks.   Patient expressed understanding and was in agreement with this plan. She also  understands that She can call the clinic at any time with any questions, concerns, or complaints.   Any controlled substances utilized were prescribed in the context of palliative care. PDMP has been reviewed.   Visit consisted of counseling and education dealing with the complex and emotionally intense issues of symptom management and palliative care in the setting of serious and potentially life-threatening illness.  Levon Borer, AGPCNP-BC  Palliative Medicine Team/[facility]

## 2024-01-28 NOTE — Progress Notes (Signed)
 [facility] Cancer Follow up:    Doris Mustard, FNP [address] [place] [address]   DIAGNOSIS:  Cancer Staging  Malignant neoplasm of upper-outer quadrant of right breast in female, estrogen receptor positive (HCC) Staging form: Breast, AJCC 8th Edition - Pathologic: Stage IB (pT3, pN41mi, cM0, G2, ER+, PR+, HER2-) - Signed by Crawford Morna Pickle, NP on 10/02/2018 Multigene prognostic tests performed: MammaPrint Histologic grading system: 3 grade system - Clinical stage from 12/28/2022: Stage IV (rcT2, rcN0, rcM1, G3, ER+, PR+, HER2-) - Signed by Loretha Ash, MD on 12/28/2022 Stage prefix: Recurrence Histologic grading system: 3 grade system Laterality: Right Staged by: Pathologist and managing physician Stage used in treatment planning: Yes National guidelines used in treatment planning: Yes Type of national guideline used in treatment planning: NCCN   SUMMARY OF ONCOLOGIC HISTORY: Oncology History  Malignant neoplasm of upper-outer quadrant of right breast in female, estrogen receptor positive (HCC)  08/08/2018 Initial Diagnosis   status post right breast biopsy 08/02/2018 for a clinical T3 N0, stage IIA invasive lobular carcinoma, grade 2, estrogen receptor strongly positive, progesterone receptor 1% positive, with no HER-2 amplification and an MIB-1 of 1%.             (a) CT scan of the head and chest, without contrast 08/23/2018 showed nonspecific 0.4 cm left lower lobe pulmonary nodule, no definitive metastatic disease             (b) bone scan 08/23/2018 shows multiple spinal areas of abnormal uptake, but             (c) total spinal MRI 09/03/2018 finds bone scan findings to be secondary to degenerative disease, no evidence of metastatic disease.   08/2018 - 10/2022 Anti-estrogen oral therapy   Anastrozole ; discontinued 10/14/2018 in preparation for chemotherapy, resumed October 2020   09/16/2018 Surgery   Right mastectomy [facility]rey)  [phone]): Invasive Lobular Carcinoma, 10.5 cm, grade 2, negative margins. 1 of 5 lymph nodes positive for carcinoma.   09/16/2018 Miscellaneous   MammaPrint high risk suggests a 5-year metastasis free survival of 93% with chemotherapy, with an absolute chemotherapy benefit in the greater than 12% range    09/16/2018 Miscellaneous   Caris testing on mastectomy sample (09/16/2018) showed stable MSI and proficient mismatch repair status, with a low mutational burden; BRCA 1 and 2 were not mutated, PI K3 was not mutated, ER B B2 was not mutated, and AKT 1 was not mutated. 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 Administration: 0.25 mg (11/05/2018), 0.25 mg (11/26/2018), 0.25 mg (12/17/2018), 0.25 mg (01/07/2019), 0.25 mg (01/28/2019), 0.25 mg (02/18/2019), 0.25 mg (03/11/2019), 0.25 mg (04/02/2019)  methotrexate  (PF) chemo injection 84 mg, 39.8 mg/m2 = 84.5 mg, Intravenous,  Once, 5 of 5 cycles. Administration: 84 mg (11/05/2018), 84 mg (11/26/2018), 84 mg (12/17/2018), 84 mg (03/11/2019), 84 mg (04/02/2019)  pegfilgrastim -cbqv (UDENYCA ) injection 6 mg, 6 mg, Subcutaneous, Once, 6 of 6 cycles. Administration: 6 mg (12/19/2018)  cyclophosphamide  (CYTOXAN ) 1,260 mg in sodium chloride  0.9 % 250 mL chemo infusion, 600 mg/m2 = 1,260 mg, Intravenous,  Once, 8 of 8 cycle. Administration: 1,260 mg (11/05/2018), 1,260 mg (11/26/2018), 1,260 mg (12/17/2018), 1,260 mg (01/07/2019), 1,260 mg (01/28/2019), 1,260 mg (02/18/2019), 1,260 mg (03/11/2019), 1,260 mg (04/02/2019)  fluorouracil  (ADRUCIL ) chemo injection 1,250  mg, 600 mg/m2 = 1,250 mg, Intravenous,  Once, 8 of 8 cycles. Administration: 1,250 mg (11/05/2018), 1,250 mg (11/26/2018), 1,250 mg (12/17/2018), 1,250 mg (01/07/2019), 1,250 mg (01/28/2019), 1,250 mg (02/18/2019), 1,250 mg (03/11/2019), 1,250 mg  (04/02/2019).    12/31/2018 - 02/19/2019 Radiation Therapy   The patient initially received a dose of 50.4 Gy in 28 fractions to the chest wall and supraclavicular region. This was delivered using a 3-D conformal, 4 field technique. The patient then received a boost to the mastectomy scar. This delivered an additional 10 Gy in 5 fractions using an en face electron field. The total dose was 60.4 Gy.   09/12/2022 Imaging   Bone scan on 09/12/2022 shows uptake in ribs, manubrium. Widespread osseous metastasis confirmed on PET scan that was completed on October 06, 2022. Right iliac crest biopsy demonstrated metastatic carcinoma consistent with patient's known breast carcinoma. ER 90% positive, PR 90% positive, Ki67 10%, HER2 negative.    10/10/2022 Treatment Plan Change   Faslodex  beginning 10/10/2022; Ibrance  beginning 12/20/2022; Zometa  every 12 weeks 11/07/2022    10/23/2022 - 11/03/2022 Radiation Therapy   Palliative Radiation 10/23/2022-11/03/2022:  left chest wall, lower T spine, and right proximal hip/pelvis were each treated to 30 GY in 10 fractions.      CURRENT THERAPY: Fulvestrant , Zometa , Verzenio   INTERVAL HISTORY:  Discussed the use of AI scribe software for clinical note transcription with the patient, who gave verbal consent to proceed.  History of Present Illness  Doris Smith is a 73 year old female with metastatic cancer who presents for follow-up regarding her treatment with Verzenio .  She has been undergoing treatment with Verzenio  for her metastatic cancer. Recent imaging shows a slight increase in the size of a liver lesion from 3.4 cm to 3.8 cm, but no new lesions in the liver. There is a slight increase in pleural effusion.  She was previously on a higher dose of Verzenio , 100 mg twice daily, but had to discontinue due to low blood counts, specifically low hemoglobin and platelets. Her platelets were last recorded at 80,000, below the desired level of 100,000. She has  been off Verzenio  since July 6th due to these issues.  She experiences worsening breathing difficulties, which she attributes to increased fluid in her lungs. She has shortness of breath, particularly when lying down or sitting up to eat. She also notes swelling in her feet and legs, making it difficult to wear shoes. She has been prescribed Lasix  20 mg for seven days to address the swelling.  She feels generally unwell, with increased fatigue and episodes of crying due to not feeling good. Her caregiver notes that she seemed to feel better when she was on Verzenio  compared to when she was off it. She also reports pain at the site of her port and has expressed concerns about its placement.  Her caregiver mentions increased anxiety about her breathing difficulties, sometimes leading to panic. She is currently taking Zyprexa  at night, which helps with her anxiety. She is also concerned about her ability to breathe and the potential need for oxygen therapy.   Patient Active Problem List   Diagnosis Date Noted   Malnutrition of moderate degree 08/17/2023   GAVE (gastric antral vascular ectasia) 08/17/2023   Gastric erosion 08/17/2023   Melena 08/16/2023   Anemia associated with acute blood loss 08/16/2023   Peripheral edema 12/29/2022   Restless leg syndrome 10/03/2021   Anxiety associated with depression 06/10/2020   Port-A-Cath in place 11/12/2018  Malignant neoplasm of upper-outer quadrant of right breast in female, estrogen receptor positive (HCC) 08/08/2018   Right knee pain 03/01/2015   Aortic atherosclerosis (HCC) 12/28/2014   Obesity (BMI 30-39.9) 04/09/2014   Insomnia due to stress 04/09/2014   Atrial fibrillation (HCC) 10/01/2013   GERD (gastroesophageal reflux disease) 10/01/2013   Bipolar disorder (HCC) 09/27/2007   Essential hypertension 09/27/2007   ALLERGIC RHINITIS 09/27/2007   IRRITABLE BOWEL SYNDROME 09/27/2007   Arthropathy 09/27/2007   DEGENERATIVE DISC DISEASE,  LUMBOSACRAL SPINE 09/27/2007    is allergic to iohexol, codeine, palonosetron , prednisone , sulfonamide derivatives, celecoxib, sulfa antibiotics, and vitamin b12.  MEDICAL HISTORY: Past Medical History:  Diagnosis Date   A-fib (HCC) 11/2012   PAF   Anxiety    takes Ativan    Asthma 04/07/2011   dx   Bipolar disorder (HCC)    Cancer (HCC)    Right breast   Depression    Early cataracts, bilateral    Fatty liver    Fibromyalgia    GERD (gastroesophageal reflux disease)    Irritable bowel syndrome    Mental disorder    dx bipolar   PONV (postoperative nausea and vomiting)    Sleep apnea 04/2011   does not wear CPAP   Tardive dyskinesia     SURGICAL HISTORY: Past Surgical History:  Procedure Laterality Date   ABDOMINAL HYSTERECTOMY     BIOPSY  08/17/2023   Procedure: BIOPSY;  Surgeon: Avram Lupita BRAVO, MD;  Location: [facility];  Service: Gastroenterology;;   COLONOSCOPY     DILATION AND CURETTAGE OF UTERUS  1981   abnormal pap   ESOPHAGOGASTRODUODENOSCOPY (EGD) WITH PROPOFOL  N/A 08/17/2023   Procedure: ESOPHAGOGASTRODUODENOSCOPY (EGD) WITH PROPOFOL ;  Surgeon: Avram Lupita BRAVO, MD;  Location: [facility];  Service: Gastroenterology;  Laterality: N/A;   HOT HEMOSTASIS N/A 08/17/2023   Procedure: HOT HEMOSTASIS (ARGON PLASMA COAGULATION/BICAP);  Surgeon: Avram Lupita BRAVO, MD;  Location: [facility];  Service: Gastroenterology;  Laterality: N/A;   IR IMAGING GUIDED PORT INSERTION  11/20/2023   IR THORACENTESIS ASP PLEURAL SPACE W/IMG GUIDE  09/13/2023   IR THORACENTESIS ASP PLEURAL SPACE W/IMG GUIDE  11/20/2023   KNEE ARTHROSCOPY Left 2007   x 2   LUMBAR LAMINECTOMY/DECOMPRESSION MICRODISCECTOMY  05/31/2011   Procedure: LUMBAR LAMINECTOMY/DECOMPRESSION MICRODISCECTOMY;  Surgeon: Reyes JAYSON Billing;  Location: [facility];  Service: Orthopedics;  Laterality: Left;  Decompression Lumbar four to five and  Lumbar five to Sacral one on Left  (X-Ray)   MASTECTOMY W/ SENTINEL NODE BIOPSY Right  09/16/2018   Procedure: RIGHT MASTECTOMY WITH RIGHT AXILLARY SENTINEL LYMPH NODE BIOPSY;  Surgeon: Ethyl Lenis, MD;  Location: [facility];  Service: General;  Laterality: Right;   PARTIAL HYSTERECTOMY  1982   PORTACATH PLACEMENT Left 10/31/2018   Procedure: INSERTION PORT-A-CATH WITH ULTRASOUND;  Surgeon: Ethyl Lenis, MD;  Location: [facility];  Service: General;  Laterality: Left;   TONSILLECTOMY     as child   TOTAL KNEE ARTHROPLASTY Left 12/18/2005    SOCIAL HISTORY: Social History   Socioeconomic History   Marital status: Married    Spouse name: Lynwood   Number of children: 2   Years of education: 12   Highest education level: Not on file  Occupational History   Occupation: Disabled  Tobacco Use   Smoking status: Never   Smokeless tobacco: Never  Vaping Use   Vaping status: Never Used  Substance and Sexual Activity   Alcohol use: No   Drug use: No  Sexual activity: Yes    Birth control/protection: Post-menopausal, Surgical  Other Topics Concern   Not on file  Social History Narrative   Tea daily.  Rarely has caffeine    Social Drivers of Corporate investment banker Strain: Patient Declined (10/22/2023)   Overall Financial Resource Strain (CARDIA)    Difficulty of Paying Living Expenses: Patient declined  Food Insecurity: Patient Declined (10/22/2023)   Hunger Vital Sign    Worried About Running Out of Food in the Last Year: Patient declined    Ran Out of Food in the Last Year: Patient declined  Transportation Needs: No Transportation Needs (10/22/2023)   PRAPARE - Administrator, Civil Service (Medical): No    Lack of Transportation (Non-Medical): No  Physical Activity: Unknown (10/22/2023)   Exercise Vital Sign    Days of Exercise per Week: 0 days    Minutes of Exercise per Session: Not on file  Stress: Stress Concern Present (10/22/2023)   [employer] of Occupational Health - Occupational Stress Questionnaire    Feeling of Stress :  To some extent  Social Connections: Unknown (10/22/2023)   Social Connection and Isolation Panel    Frequency of Communication with Friends and Family: More than three times a week    Frequency of Social Gatherings with Friends and Family: Twice a week    Attends Religious Services: Patient declined    Database administrator or Organizations: No    Attends Banker Meetings: Never    Marital Status: Married  Catering manager Violence: Not At Risk (08/16/2023)   Humiliation, Afraid, Rape, and Kick questionnaire    Fear of Current or Ex-Partner: No    Emotionally Abused: No    Physically Abused: No    Sexually Abused: No    FAMILY HISTORY: Family History  Problem Relation Age of Onset   Anesthesia problems Mother    Heart disease Mother        CHF, atrial fib   Heart failure Mother    Anesthesia problems Sister    Colon polyps Father    Heart disease Father        Fluid around the heart   Hypertension Sister    Breast cancer Maternal Aunt    Colon cancer Maternal Aunt    Prostate cancer Neg Hx    Ovarian cancer Neg Hx     Review of Systems  Constitutional:  Negative for appetite change, chills, fatigue, fever and unexpected weight change.  HENT:   Negative for hearing loss, lump/mass and trouble swallowing.   Eyes:  Negative for eye problems and icterus.  Respiratory:  Negative for chest tightness, cough and shortness of breath.   Cardiovascular:  Negative for chest pain, leg swelling and palpitations.  Gastrointestinal:  Negative for abdominal distention, abdominal pain, constipation, diarrhea, nausea and vomiting.  Endocrine: Negative for hot flashes.  Genitourinary:  Negative for difficulty urinating.   Musculoskeletal:  Negative for arthralgias.  Skin:  Negative for itching and rash.  Neurological:  Negative for dizziness, extremity weakness, headaches and numbness.  Hematological:  Negative for adenopathy. Does not bruise/bleed easily.   Psychiatric/Behavioral:  Negative for depression. The patient is not nervous/anxious.       PHYSICAL EXAMINATION  BP (!) 124/55 (BP Location: Left Arm, Patient Position: Sitting)   Pulse 79   Temp (!) 97 F (36.1 C) (Temporal)   Resp 18   Wt 156 lb 6 oz (70.9 kg)   SpO2 100%  BMI 26.84 kg/m    She appears to be in no acute distress She is tearful,  comfortable having a conversation with no evidence of resp distress BLE swelling, symmetrical.   LABORATORY DATA:  CBC    Component Value Date/Time   WBC 2.2 (L) 01/23/2024 1353   WBC 2.9 (L) 10/16/2023 1103   RBC 2.45 (L) 01/23/2024 1353   HGB 8.2 (L) 01/23/2024 1353   HGB 12.0 06/05/2022 1024   HCT 24.6 (L) 01/23/2024 1353   HCT 37.4 06/05/2022 1024   PLT 80 (L) 01/23/2024 1353   PLT 251 06/05/2022 1024   MCV 100.4 (H) 01/23/2024 1353   MCV 86 06/05/2022 1024   MCH 33.5 01/23/2024 1353   MCHC 33.3 01/23/2024 1353   RDW 16.2 (H) 01/23/2024 1353   RDW 15.5 (H) 06/05/2022 1024   LYMPHSABS 0.3 (L) 01/23/2024 1353   LYMPHSABS 0.9 06/05/2022 1024   MONOABS 0.3 01/23/2024 1353   EOSABS 0.1 01/23/2024 1353   EOSABS 0.1 06/05/2022 1024   BASOSABS 0.0 01/23/2024 1353   BASOSABS 0.1 06/05/2022 1024    CMP     Component Value Date/Time   NA 140 01/16/2024 1342   NA 145 (H) 06/05/2022 1024   K 4.2 01/16/2024 1342   CL 109 01/16/2024 1342   CO2 27 01/16/2024 1342   GLUCOSE 122 (H) 01/16/2024 1342   BUN 13 01/16/2024 1342   BUN 17 06/05/2022 1024   CREATININE 0.65 01/16/2024 1342   CALCIUM 8.8 (L) 01/16/2024 1342   PROT 5.3 (L) 01/16/2024 1342   PROT 6.6 06/05/2022 1024   ALBUMIN 3.1 (L) 01/16/2024 1342   ALBUMIN 4.0 06/05/2022 1024   AST 32 01/16/2024 1342   ALT 13 01/16/2024 1342   ALKPHOS 356 (H) 01/16/2024 1342   BILITOT 0.4 01/16/2024 1342   GFRNONAA >60 01/16/2024 1342   GFRAA 81 02/03/2020 1049   GFRAA >60 12/19/2018 1434       ASSESSMENT and THERAPY PLAN:   No problem-specific Assessment &  Plan notes found for this encounter.  Assessment and Plan Assessment & Plan Malignant neoplasm of liver Liver lesion increased from 3.4 cm to 3.8 cm, indicating slight progression. Current Verzenio  dosing not well tolerated due to low blood counts. Disease not rapidly progressing, allowing treatment adjustments.Once again they want us  to titrate verzenio  to 100 mg PO BID one day followed by 100 mg po daily the following day. - Since she tolerated medication poorly and since disease is not rapidly progressing, I think this is reasonable. We can try this dose, track tumor markers and if there is concern for progression, we can try switching to orserdu or xeloda.   Pleural effusion Slight increase in pleural effusion causing dyspnea. Previous thoracentesis provided limited relief. Moderate effusion not significantly affecting oxygenation at rest. - Consider thoracentesis if symptoms worsen. - Perform a six-minute walk test to assess oxygenation and determine need for supplemental oxygen. - She will qualify for home oxygen, ordered by pall care. - US  ordered for thoracentesis.  Edema Edema in feet and legs likely due to limited mobility and possibly related to cancer treatment. - Continue Lasix  as prescribed by primary care physician. - Encourage mobility to reduce edema.   All questions were answered. The patient knows to call the clinic with any problems, questions or concerns. We can certainly see the patient much sooner if necessary.  Total encounter time:40 minutes*in face-to-face visit time, chart review, lab review, care coordination, order entry, and documentation of the encounter  time.  *Total Encounter Time as defined by the Centers for Medicare and Medicaid Services includes, in addition to the face-to-face time of a patient visit (documented in the note above) non-face-to-face time: obtaining and reviewing outside history, ordering and reviewing medications, tests or procedures, care  coordination (communications with other health care professionals or caregivers) and documentation in the medical record.

## 2024-01-29 ENCOUNTER — Other Ambulatory Visit: Payer: Self-pay | Admitting: Nurse Practitioner

## 2024-01-29 ENCOUNTER — Ambulatory Visit (HOSPITAL_COMMUNITY)
Admission: RE | Admit: 2024-01-29 | Discharge: 2024-01-29 | Disposition: A | Discharge status: Home / Self Care | Source: Ambulatory Visit | Attending: Hematology and Oncology | Admitting: Hematology and Oncology

## 2024-01-29 ENCOUNTER — Ambulatory Visit (HOSPITAL_COMMUNITY)

## 2024-01-29 ENCOUNTER — Ambulatory Visit (HOSPITAL_COMMUNITY)
Admission: RE | Admit: 2024-01-29 | Discharge: 2024-01-29 | Disposition: A | Discharge status: Home / Self Care | Source: Ambulatory Visit | Attending: Radiology | Admitting: Radiology

## 2024-01-29 VITALS — BP 105/51

## 2024-01-29 DIAGNOSIS — K449 Diaphragmatic hernia without obstruction or gangrene: Secondary | ICD-10-CM | POA: Diagnosis not present

## 2024-01-29 DIAGNOSIS — C50411 Malignant neoplasm of upper-outer quadrant of right female breast: Secondary | ICD-10-CM

## 2024-01-29 DIAGNOSIS — R0602 Shortness of breath: Secondary | ICD-10-CM

## 2024-01-29 DIAGNOSIS — J90 Pleural effusion, not elsewhere classified: Secondary | ICD-10-CM | POA: Diagnosis not present

## 2024-01-29 DIAGNOSIS — C7951 Secondary malignant neoplasm of bone: Secondary | ICD-10-CM | POA: Insufficient documentation

## 2024-01-29 DIAGNOSIS — C801 Malignant (primary) neoplasm, unspecified: Secondary | ICD-10-CM | POA: Diagnosis not present

## 2024-01-29 DIAGNOSIS — J910 Malignant pleural effusion: Secondary | ICD-10-CM

## 2024-01-29 DIAGNOSIS — Z48813 Encounter for surgical aftercare following surgery on the respiratory system: Secondary | ICD-10-CM | POA: Diagnosis not present

## 2024-01-29 LAB — CANCER ANTIGEN 27.29: CA 27.29: 978.4 U/mL — ABNORMAL HIGH (ref 0.0–38.6)

## 2024-01-29 MED ORDER — LIDOCAINE-PRILOCAINE 2.5-2.5 % EX CREA: 1.0000 | TOPICAL_CREAM | CUTANEOUS | 3 refills | Status: DC | PRN

## 2024-01-29 MED ORDER — LIDOCAINE-EPINEPHRINE (PF) 2 %-1:200000 IJ SOLN
INTRAMUSCULAR | Status: AC
  Filled 2024-01-29: qty 20

## 2024-01-29 NOTE — Addendum Note (Signed)
 Encounter addended by: Zulema Emmalene PARAS, RN on: 01/29/2024 5:42 PM  Actions taken: Imaging Exam ended

## 2024-01-29 NOTE — Procedures (Signed)
 Ultrasound-guided therapeutic right thoracentesis performed yielding 400 cc of yellow fluid. No immediate complications. Follow-up chest x-ray revealed no pneumothorax. EBL none.

## 2024-01-30 ENCOUNTER — Other Ambulatory Visit: Payer: Self-pay | Admitting: *Deleted

## 2024-01-30 ENCOUNTER — Telehealth: Payer: Self-pay

## 2024-01-30 DIAGNOSIS — R0602 Shortness of breath: Secondary | ICD-10-CM | POA: Diagnosis not present

## 2024-01-30 DIAGNOSIS — C50411 Malignant neoplasm of upper-outer quadrant of right female breast: Secondary | ICD-10-CM | POA: Diagnosis not present

## 2024-01-30 NOTE — Telephone Encounter (Signed)
 Called pt and she confirmed that she did receive the home oxygen delivery. No further needs at this time. Schedule reviewed.

## 2024-01-31 ENCOUNTER — Inpatient Hospital Stay

## 2024-02-04 ENCOUNTER — Other Ambulatory Visit (HOSPITAL_COMMUNITY): Payer: Self-pay

## 2024-02-04 ENCOUNTER — Inpatient Hospital Stay

## 2024-02-04 ENCOUNTER — Other Ambulatory Visit: Payer: Self-pay | Admitting: *Deleted

## 2024-02-04 DIAGNOSIS — D61818 Other pancytopenia: Secondary | ICD-10-CM

## 2024-02-04 DIAGNOSIS — D649 Anemia, unspecified: Secondary | ICD-10-CM

## 2024-02-04 DIAGNOSIS — Z9011 Acquired absence of right breast and nipple: Secondary | ICD-10-CM | POA: Diagnosis not present

## 2024-02-04 DIAGNOSIS — Z79899 Other long term (current) drug therapy: Secondary | ICD-10-CM | POA: Diagnosis not present

## 2024-02-04 DIAGNOSIS — C50411 Malignant neoplasm of upper-outer quadrant of right female breast: Secondary | ICD-10-CM | POA: Diagnosis not present

## 2024-02-04 DIAGNOSIS — Z170 Estrogen receptor positive status [ER+]: Secondary | ICD-10-CM | POA: Diagnosis not present

## 2024-02-04 DIAGNOSIS — T451X5A Adverse effect of antineoplastic and immunosuppressive drugs, initial encounter: Secondary | ICD-10-CM | POA: Diagnosis not present

## 2024-02-04 DIAGNOSIS — G893 Neoplasm related pain (acute) (chronic): Secondary | ICD-10-CM | POA: Diagnosis not present

## 2024-02-04 DIAGNOSIS — D6481 Anemia due to antineoplastic chemotherapy: Secondary | ICD-10-CM | POA: Diagnosis not present

## 2024-02-04 DIAGNOSIS — C787 Secondary malignant neoplasm of liver and intrahepatic bile duct: Secondary | ICD-10-CM | POA: Diagnosis not present

## 2024-02-04 DIAGNOSIS — Z5111 Encounter for antineoplastic chemotherapy: Secondary | ICD-10-CM | POA: Diagnosis not present

## 2024-02-04 DIAGNOSIS — C7951 Secondary malignant neoplasm of bone: Secondary | ICD-10-CM | POA: Diagnosis not present

## 2024-02-04 LAB — CBC WITH DIFFERENTIAL (CANCER CENTER ONLY)
Abs Immature Granulocytes: 0.02 K/uL (ref 0.00–0.07)
Basophils Absolute: 0 K/uL (ref 0.0–0.1)
Basophils Relative: 2 %
Eosinophils Absolute: 0.1 K/uL (ref 0.0–0.5)
Eosinophils Relative: 4 %
HCT: 21.9 % — ABNORMAL LOW (ref 36.0–46.0)
Hemoglobin: 7.1 g/dL — ABNORMAL LOW (ref 12.0–15.0)
Immature Granulocytes: 1 %
Lymphocytes Relative: 13 %
Lymphs Abs: 0.4 K/uL — ABNORMAL LOW (ref 0.7–4.0)
MCH: 32.9 pg (ref 26.0–34.0)
MCHC: 32.4 g/dL (ref 30.0–36.0)
MCV: 101.4 fL — ABNORMAL HIGH (ref 80.0–100.0)
Monocytes Absolute: 0.3 K/uL (ref 0.1–1.0)
Monocytes Relative: 12 %
Neutro Abs: 1.9 K/uL (ref 1.7–7.7)
Neutrophils Relative %: 68 %
Platelet Count: 156 K/uL (ref 150–400)
RBC: 2.16 MIL/uL — ABNORMAL LOW (ref 3.87–5.11)
RDW: 16.6 % — ABNORMAL HIGH (ref 11.5–15.5)
WBC Count: 2.7 K/uL — ABNORMAL LOW (ref 4.0–10.5)
nRBC: 0 % (ref 0.0–0.2)

## 2024-02-04 LAB — CMP (CANCER CENTER ONLY)
ALT: 19 U/L (ref 0–44)
AST: 42 U/L — ABNORMAL HIGH (ref 15–41)
Albumin: 3 g/dL — ABNORMAL LOW (ref 3.5–5.0)
Alkaline Phosphatase: 235 U/L — ABNORMAL HIGH (ref 38–126)
Anion gap: 5 (ref 5–15)
BUN: 13 mg/dL (ref 8–23)
CO2: 28 mmol/L (ref 22–32)
Calcium: 8.4 mg/dL — ABNORMAL LOW (ref 8.9–10.3)
Chloride: 107 mmol/L (ref 98–111)
Creatinine: 0.75 mg/dL (ref 0.44–1.00)
GFR, Estimated: >60 mL/min (ref >60)
Glucose, Bld: 93 mg/dL (ref 70–99)
Potassium: 4 mmol/L (ref 3.5–5.1)
Sodium: 140 mmol/L (ref 135–145)
Total Bilirubin: 0.5 mg/dL (ref 0.0–1.2)
Total Protein: 5.5 g/dL — ABNORMAL LOW (ref 6.5–8.1)

## 2024-02-04 LAB — SAMPLE TO BLOOD BANK

## 2024-02-04 LAB — PREPARE RBC (CROSSMATCH)

## 2024-02-04 MED ORDER — SODIUM CHLORIDE 0.9% FLUSH
10.0000 mL | Freq: Once | INTRAVENOUS | Status: AC | PRN
Start: 2024-02-04 — End: 2024-02-04
  Administered 2024-02-04: 10 mL

## 2024-02-04 MED ORDER — HEPARIN SOD (PORK) LOCK FLUSH 100 UNIT/ML IV SOLN
500.0000 [IU] | Freq: Once | INTRAVENOUS | Status: AC | PRN
Start: 2024-02-04 — End: 2024-02-04
  Administered 2024-02-04: 500 [IU]

## 2024-02-05 LAB — CANCER ANTIGEN 27.29: CA 27.29: 1105 U/mL — ABNORMAL HIGH (ref 0.0–38.6)

## 2024-02-06 ENCOUNTER — Ambulatory Visit

## 2024-02-06 ENCOUNTER — Other Ambulatory Visit: Payer: Self-pay | Admitting: Hematology and Oncology

## 2024-02-06 ENCOUNTER — Other Ambulatory Visit: Payer: Self-pay

## 2024-02-06 DIAGNOSIS — C787 Secondary malignant neoplasm of liver and intrahepatic bile duct: Secondary | ICD-10-CM | POA: Diagnosis not present

## 2024-02-06 DIAGNOSIS — Z79899 Other long term (current) drug therapy: Secondary | ICD-10-CM | POA: Diagnosis not present

## 2024-02-06 DIAGNOSIS — T451X5A Adverse effect of antineoplastic and immunosuppressive drugs, initial encounter: Secondary | ICD-10-CM | POA: Diagnosis not present

## 2024-02-06 DIAGNOSIS — Z170 Estrogen receptor positive status [ER+]: Secondary | ICD-10-CM | POA: Diagnosis not present

## 2024-02-06 DIAGNOSIS — D649 Anemia, unspecified: Secondary | ICD-10-CM

## 2024-02-06 DIAGNOSIS — Z9011 Acquired absence of right breast and nipple: Secondary | ICD-10-CM | POA: Diagnosis not present

## 2024-02-06 DIAGNOSIS — D61818 Other pancytopenia: Secondary | ICD-10-CM

## 2024-02-06 DIAGNOSIS — D6481 Anemia due to antineoplastic chemotherapy: Secondary | ICD-10-CM | POA: Diagnosis not present

## 2024-02-06 DIAGNOSIS — C7951 Secondary malignant neoplasm of bone: Secondary | ICD-10-CM | POA: Diagnosis not present

## 2024-02-06 DIAGNOSIS — Z5111 Encounter for antineoplastic chemotherapy: Secondary | ICD-10-CM | POA: Diagnosis not present

## 2024-02-06 DIAGNOSIS — G893 Neoplasm related pain (acute) (chronic): Secondary | ICD-10-CM | POA: Diagnosis not present

## 2024-02-06 DIAGNOSIS — C50411 Malignant neoplasm of upper-outer quadrant of right female breast: Secondary | ICD-10-CM | POA: Diagnosis not present

## 2024-02-06 MED ORDER — ABEMACICLIB 100 MG PO TABS
100.0000 mg | ORAL_TABLET | Freq: Two times a day (BID) | ORAL | 0 refills | Status: DC
  Filled 2024-02-06 – 2024-02-08 (×2): qty 56, 28d supply, fill #0

## 2024-02-06 MED ORDER — SODIUM CHLORIDE 0.9% IV SOLUTION
250.0000 mL | INTRAVENOUS | Status: DC
  Administered 2024-02-06: 100 mL via INTRAVENOUS

## 2024-02-06 MED ORDER — SODIUM CHLORIDE 0.9% FLUSH: 3.0000 mL | INTRAVENOUS | Status: DC | PRN

## 2024-02-06 MED ORDER — DIPHENHYDRAMINE HCL 25 MG PO CAPS
25.0000 mg | ORAL_CAPSULE | Freq: Once | ORAL | Status: AC
  Administered 2024-02-06: 25 mg via ORAL
  Filled 2024-02-06: qty 1

## 2024-02-06 MED ORDER — ACETAMINOPHEN 325 MG PO TABS
650.0000 mg | ORAL_TABLET | Freq: Once | ORAL | Status: AC
  Administered 2024-02-06: 650 mg via ORAL
  Filled 2024-02-06: qty 2

## 2024-02-06 NOTE — Patient Instructions (Signed)

## 2024-02-07 LAB — TYPE AND SCREEN
ABO/RH(D): O POS
Antibody Screen: NEGATIVE
Unit division: 0

## 2024-02-07 LAB — BPAM RBC
Blood Product Expiration Date: 202508262359
ISSUE DATE / TIME: 202507300921
Unit Type and Rh: 202508262359
Unit Type and Rh: 5100

## 2024-02-08 ENCOUNTER — Other Ambulatory Visit: Payer: Self-pay

## 2024-02-08 ENCOUNTER — Encounter: Payer: Self-pay | Admitting: Hematology and Oncology

## 2024-02-11 ENCOUNTER — Telehealth: Payer: Self-pay

## 2024-02-11 ENCOUNTER — Encounter: Payer: Self-pay | Admitting: Hematology and Oncology

## 2024-02-11 NOTE — Telephone Encounter (Signed)
 Pt called and reported tat she did not have to have a walking test for her portable oxygen. RN called [facility] to confirm, pt doses in fact need a walking test to test for conserving service settings on her portable. Pt informed and verbalized understanding provided scheduling number from [employer] at [phone]. No further needs at this time.

## 2024-02-11 NOTE — Telephone Encounter (Signed)
 Pt called reporting a cough for the last 3 days, nonproductive, no fevers, or sinus pressure/pain, and no drainage. Pt reports that she has not tried any OTC medications as she was unsure. RN suggested robitussin or a similar over the counter cough suppressant, pt reports using one at home in the past. RN educated on the need to call back with any changes or questions, to monitor the fevers, as well on the need to call for her portable oxygen set up. Pt and husband verbalized understanding. No further needs at this time.

## 2024-02-12 ENCOUNTER — Telehealth: Payer: Self-pay

## 2024-02-12 NOTE — Telephone Encounter (Signed)
 Pt verbally confirmed appt for 8/6

## 2024-02-13 ENCOUNTER — Inpatient Hospital Stay (HOSPITAL_BASED_OUTPATIENT_CLINIC_OR_DEPARTMENT_OTHER): Attending: Hematology and Oncology | Admitting: Hematology and Oncology

## 2024-02-13 ENCOUNTER — Inpatient Hospital Stay

## 2024-02-13 ENCOUNTER — Other Ambulatory Visit: Payer: Self-pay | Admitting: *Deleted

## 2024-02-13 ENCOUNTER — Inpatient Hospital Stay: Attending: Hematology and Oncology

## 2024-02-13 VITALS — BP 125/56 | HR 88 | Temp 98.3°F | Resp 15 | Wt 154.4 lb

## 2024-02-13 DIAGNOSIS — Z5111 Encounter for antineoplastic chemotherapy: Secondary | ICD-10-CM | POA: Insufficient documentation

## 2024-02-13 DIAGNOSIS — C50411 Malignant neoplasm of upper-outer quadrant of right female breast: Secondary | ICD-10-CM | POA: Insufficient documentation

## 2024-02-13 DIAGNOSIS — I10 Essential (primary) hypertension: Secondary | ICD-10-CM | POA: Diagnosis not present

## 2024-02-13 DIAGNOSIS — K31819 Angiodysplasia of stomach and duodenum without bleeding: Secondary | ICD-10-CM | POA: Diagnosis not present

## 2024-02-13 DIAGNOSIS — K259 Gastric ulcer, unspecified as acute or chronic, without hemorrhage or perforation: Secondary | ICD-10-CM | POA: Insufficient documentation

## 2024-02-13 DIAGNOSIS — Z1732 Human epidermal growth factor receptor 2 negative status: Secondary | ICD-10-CM | POA: Insufficient documentation

## 2024-02-13 DIAGNOSIS — D630 Anemia in neoplastic disease: Secondary | ICD-10-CM | POA: Diagnosis not present

## 2024-02-13 DIAGNOSIS — Z170 Estrogen receptor positive status [ER+]: Secondary | ICD-10-CM

## 2024-02-13 DIAGNOSIS — F419 Anxiety disorder, unspecified: Secondary | ICD-10-CM | POA: Insufficient documentation

## 2024-02-13 DIAGNOSIS — K589 Irritable bowel syndrome without diarrhea: Secondary | ICD-10-CM | POA: Insufficient documentation

## 2024-02-13 DIAGNOSIS — Z90710 Acquired absence of both cervix and uterus: Secondary | ICD-10-CM | POA: Insufficient documentation

## 2024-02-13 DIAGNOSIS — J910 Malignant pleural effusion: Secondary | ICD-10-CM | POA: Insufficient documentation

## 2024-02-13 DIAGNOSIS — G2581 Restless legs syndrome: Secondary | ICD-10-CM | POA: Insufficient documentation

## 2024-02-13 DIAGNOSIS — Z83719 Family history of colon polyps, unspecified: Secondary | ICD-10-CM | POA: Insufficient documentation

## 2024-02-13 DIAGNOSIS — R530 Neoplastic (malignant) related fatigue: Secondary | ICD-10-CM | POA: Diagnosis not present

## 2024-02-13 DIAGNOSIS — Z1721 Progesterone receptor positive status: Secondary | ICD-10-CM | POA: Diagnosis not present

## 2024-02-13 DIAGNOSIS — C7951 Secondary malignant neoplasm of bone: Secondary | ICD-10-CM | POA: Insufficient documentation

## 2024-02-13 DIAGNOSIS — E440 Moderate protein-calorie malnutrition: Secondary | ICD-10-CM | POA: Insufficient documentation

## 2024-02-13 DIAGNOSIS — Z8249 Family history of ischemic heart disease and other diseases of the circulatory system: Secondary | ICD-10-CM | POA: Insufficient documentation

## 2024-02-13 DIAGNOSIS — C7800 Secondary malignant neoplasm of unspecified lung: Secondary | ICD-10-CM | POA: Insufficient documentation

## 2024-02-13 DIAGNOSIS — I480 Paroxysmal atrial fibrillation: Secondary | ICD-10-CM | POA: Insufficient documentation

## 2024-02-13 DIAGNOSIS — Z79899 Other long term (current) drug therapy: Secondary | ICD-10-CM | POA: Insufficient documentation

## 2024-02-13 DIAGNOSIS — K921 Melena: Secondary | ICD-10-CM | POA: Diagnosis not present

## 2024-02-13 DIAGNOSIS — R0602 Shortness of breath: Secondary | ICD-10-CM | POA: Insufficient documentation

## 2024-02-13 DIAGNOSIS — Z800 Family history of malignant neoplasm of digestive organs: Secondary | ICD-10-CM | POA: Insufficient documentation

## 2024-02-13 DIAGNOSIS — Z803 Family history of malignant neoplasm of breast: Secondary | ICD-10-CM | POA: Insufficient documentation

## 2024-02-13 LAB — CBC WITH DIFFERENTIAL (CANCER CENTER ONLY)
Abs Immature Granulocytes: 0.02 K/uL (ref 0.00–0.07)
Basophils Absolute: 0 K/uL (ref 0.0–0.1)
Basophils Relative: 2 %
Eosinophils Absolute: 0.1 K/uL (ref 0.0–0.5)
Eosinophils Relative: 4 %
HCT: 27.5 % — ABNORMAL LOW (ref 36.0–46.0)
Hemoglobin: 8.9 g/dL — ABNORMAL LOW (ref 12.0–15.0)
Immature Granulocytes: 1 %
Lymphocytes Relative: 13 %
Lymphs Abs: 0.3 K/uL — ABNORMAL LOW (ref 0.7–4.0)
MCH: 32.4 pg (ref 26.0–34.0)
MCHC: 32.4 g/dL (ref 30.0–36.0)
MCV: 100 fL (ref 80.0–100.0)
Monocytes Absolute: 0.3 K/uL (ref 0.1–1.0)
Monocytes Relative: 11 %
Neutro Abs: 1.7 K/uL (ref 1.7–7.7)
Neutrophils Relative %: 69 %
Platelet Count: 119 K/uL — ABNORMAL LOW (ref 150–400)
RBC: 2.75 MIL/uL — ABNORMAL LOW (ref 3.87–5.11)
RDW: 16.1 % — ABNORMAL HIGH (ref 11.5–15.5)
WBC Count: 2.4 K/uL — ABNORMAL LOW (ref 4.0–10.5)
nRBC: 0 % (ref 0.0–0.2)

## 2024-02-13 LAB — CMP (CANCER CENTER ONLY)
ALT: 19 U/L (ref 0–44)
AST: 40 U/L (ref 15–41)
Albumin: 3.1 g/dL — ABNORMAL LOW (ref 3.5–5.0)
Alkaline Phosphatase: 197 U/L — ABNORMAL HIGH (ref 38–126)
Anion gap: 4 — ABNORMAL LOW (ref 5–15)
BUN: 15 mg/dL (ref 8–23)
CO2: 30 mmol/L (ref 22–32)
Calcium: 8.6 mg/dL — ABNORMAL LOW (ref 8.9–10.3)
Chloride: 106 mmol/L (ref 98–111)
Creatinine: 0.74 mg/dL (ref 0.44–1.00)
GFR, Estimated: >60 mL/min (ref >60)
Glucose, Bld: 122 mg/dL — ABNORMAL HIGH (ref 70–99)
Potassium: 4.1 mmol/L (ref 3.5–5.1)
Sodium: 140 mmol/L (ref 135–145)
Total Bilirubin: 0.5 mg/dL (ref 0.0–1.2)
Total Protein: 5.5 g/dL — ABNORMAL LOW (ref 6.5–8.1)

## 2024-02-13 MED ORDER — SODIUM CHLORIDE 0.9% FLUSH
10.0000 mL | Freq: Once | INTRAVENOUS | Status: AC | PRN
Start: 2024-02-13 — End: 2024-02-13
  Administered 2024-02-13: 10 mL

## 2024-02-13 MED ORDER — FULVESTRANT 250 MG/5ML IM SOSY
500.0000 mg | PREFILLED_SYRINGE | Freq: Once | INTRAMUSCULAR | Status: AC
Start: 2024-02-13 — End: 2024-02-13
  Administered 2024-02-13: 500 mg via INTRAMUSCULAR
  Filled 2024-02-13: qty 10

## 2024-02-13 NOTE — Progress Notes (Signed)
  Cancer Center Cancer Follow up:    Doris Mustard, FNP [address] [place] [address]   DIAGNOSIS:  Cancer Staging  Malignant neoplasm of upper-outer quadrant of right breast in female, estrogen receptor positive (HCC) Staging form: Breast, AJCC 8th Edition - Pathologic: Stage IB (pT3, pN53mi, cM0, G2, ER+, PR+, HER2-) - Signed by Crawford Morna Pickle, NP on 10/02/2018 Multigene prognostic tests performed: MammaPrint Histologic grading system: 3 grade system - Clinical stage from 12/28/2022: Stage IV (rcT2, rcN0, rcM1, G3, ER+, PR+, HER2-) - Signed by Loretha Ash, MD on 12/28/2022 Stage prefix: Recurrence Histologic grading system: 3 grade system Laterality: Right Staged by: Pathologist and managing physician Stage used in treatment planning: Yes National guidelines used in treatment planning: Yes Type of national guideline used in treatment planning: NCCN   SUMMARY OF ONCOLOGIC HISTORY: Oncology History  Malignant neoplasm of upper-outer quadrant of right breast in female, estrogen receptor positive (HCC)  08/08/2018 Initial Diagnosis   status post right breast biopsy 08/02/2018 for a clinical T3 N0, stage IIA invasive lobular carcinoma, grade 2, estrogen receptor strongly positive, progesterone receptor 1% positive, with no HER-2 amplification and an MIB-1 of 1%.             (a) CT scan of the head and chest, without contrast 08/23/2018 showed nonspecific 0.4 cm left lower lobe pulmonary nodule, no definitive metastatic disease             (b) bone scan 08/23/2018 shows multiple spinal areas of abnormal uptake, but             (c) total spinal MRI 09/03/2018 finds bone scan findings to be secondary to degenerative disease, no evidence of metastatic disease.   08/2018 - 10/2022 Anti-estrogen oral therapy   Anastrozole ; discontinued 10/14/2018 in preparation for chemotherapy, resumed October 2020   09/16/2018 Surgery   Right mastectomy [facility])  [phone]): Invasive Lobular Carcinoma, 10.5 cm, grade 2, negative margins. 1 of 5 lymph nodes positive for carcinoma.   09/16/2018 Miscellaneous   MammaPrint high risk suggests a 5-year metastasis free survival of 93% with chemotherapy, with an absolute chemotherapy benefit in the greater than 12% range    09/16/2018 Miscellaneous   Caris testing on mastectomy sample (09/16/2018) showed stable MSI and proficient mismatch repair status, with a low mutational burden; BRCA 1 and 2 were not mutated, PI K3 was not mutated, ER B B2 was not mutated, and AKT 1 was not mutated. 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 Administration: 0.25 mg (11/05/2018), 0.25 mg (11/26/2018), 0.25 mg (12/17/2018), 0.25 mg (01/07/2019), 0.25 mg (01/28/2019), 0.25 mg (02/18/2019), 0.25 mg (03/11/2019), 0.25 mg (04/02/2019)  methotrexate  (PF) chemo injection 84 mg, 39.8 mg/m2 = 84.5 mg, Intravenous,  Once, 5 of 5 cycles. Administration: 84 mg (11/05/2018), 84 mg (11/26/2018), 84 mg (12/17/2018), 84 mg (03/11/2019), 84 mg (04/02/2019)  pegfilgrastim -cbqv (UDENYCA ) injection 6 mg, 6 mg, Subcutaneous, Once, 6 of 6 cycles. Administration: 6 mg (12/19/2018)  cyclophosphamide  (CYTOXAN ) 1,260 mg in sodium chloride  0.9 % 250 mL chemo infusion, 600 mg/m2 = 1,260 mg, Intravenous,  Once, 8 of 8 cycle. Administration: 1,260 mg (11/05/2018), 1,260 mg (11/26/2018), 1,260 mg (12/17/2018), 1,260 mg (01/07/2019), 1,260 mg (01/28/2019), 1,260 mg (02/18/2019), 1,260 mg (03/11/2019), 1,260 mg (04/02/2019)  fluorouracil  (ADRUCIL ) chemo injection 1,250  mg, 600 mg/m2 = 1,250 mg, Intravenous,  Once, 8 of 8 cycles. Administration: 1,250 mg (11/05/2018), 1,250 mg (11/26/2018), 1,250 mg (12/17/2018), 1,250 mg (01/07/2019), 1,250 mg (01/28/2019), 1,250 mg (02/18/2019), 1,250 mg (03/11/2019), 1,250 mg  (04/02/2019).    12/31/2018 - 02/19/2019 Radiation Therapy   The patient initially received a dose of 50.4 Gy in 28 fractions to the chest wall and supraclavicular region. This was delivered using a 3-D conformal, 4 field technique. The patient then received a boost to the mastectomy scar. This delivered an additional 10 Gy in 5 fractions using an en face electron field. The total dose was 60.4 Gy.   09/12/2022 Imaging   Bone scan on 09/12/2022 shows uptake in ribs, manubrium. Widespread osseous metastasis confirmed on PET scan that was completed on October 06, 2022. Right iliac crest biopsy demonstrated metastatic carcinoma consistent with patient's known breast carcinoma. ER 90% positive, PR 90% positive, Ki67 10%, HER2 negative.    10/10/2022 Treatment Plan Change   Faslodex  beginning 10/10/2022; Ibrance  beginning 12/20/2022; Zometa  every 12 weeks 11/07/2022    10/23/2022 - 11/03/2022 Radiation Therapy   Palliative Radiation 10/23/2022-11/03/2022:  left chest wall, lower T spine, and right proximal hip/pelvis were each treated to 30 GY in 10 fractions.      CURRENT THERAPY: Fulvestrant , Zometa , Verzenio   INTERVAL HISTORY:  Discussed the use of AI scribe software for clinical note transcription with the patient, who gave verbal consent to proceed.  History of Present Illness  Doris Smith is a 73 year old female with breast cancer who presents with fatigue and a new cough.  She experiences persistent fatigue, which has not worsened with her current medication dose but is more pronounced than three to four weeks ago. Her sleep quality has declined, contributing to her fatigue.  She has developed a new deep cough over the past one to two weeks, described as 'deep seated.' There is no throat irritation. She uses Robitussin for symptomatic relief.  Her appetite is inconsistent, fluctuating between normal and reduced. No diarrhea, nausea, or vomiting, although she last vomited two to three  weeks ago.  She experiences occasional shortness of breath, sometimes feeling like she is 'gasping for air.' She uses supplemental oxygen at home as needed, which she finds beneficial.  She is currently on a medication regimen of Verzenio , alternating between 200 mg one day and 100 mg the next. She is also due for a Faslodex  injection today. Her nutritional intake includes ice cream, fruit, and chocolate Boost shakes for additional calories.   Patient Active Problem List   Diagnosis Date Noted   Malnutrition of moderate degree 08/17/2023   GAVE (gastric antral vascular ectasia) 08/17/2023   Gastric erosion 08/17/2023   Melena 08/16/2023   Anemia associated with acute blood loss 08/16/2023   Peripheral edema 12/29/2022   Restless leg syndrome 10/03/2021   Anxiety associated with depression 06/10/2020   Port-A-Cath in place 11/12/2018   Malignant neoplasm of upper-outer quadrant of right breast in female, estrogen receptor positive (HCC) 08/08/2018   Right knee pain 03/01/2015   Aortic atherosclerosis (HCC) 12/28/2014   Obesity (BMI 30-39.9) 04/09/2014   Insomnia due to stress 04/09/2014   Atrial fibrillation (HCC) 10/01/2013   GERD (gastroesophageal reflux disease) 10/01/2013   Bipolar disorder (HCC) 09/27/2007   Essential hypertension 09/27/2007   ALLERGIC RHINITIS 09/27/2007   IRRITABLE BOWEL SYNDROME 09/27/2007   Arthropathy 09/27/2007   DEGENERATIVE DISC DISEASE, LUMBOSACRAL SPINE 09/27/2007    is allergic to iohexol, codeine, palonosetron ,  prednisone , sulfonamide derivatives, celecoxib, sulfa antibiotics, and vitamin b12.  MEDICAL HISTORY: Past Medical History:  Diagnosis Date   A-fib (HCC) 11/2012   PAF   Anxiety    takes Ativan    Asthma 04/07/2011   dx   Bipolar disorder (HCC)    Cancer (HCC)    Right breast   Depression    Early cataracts, bilateral    Fatty liver    Fibromyalgia    GERD (gastroesophageal reflux disease)    Irritable bowel syndrome     Mental disorder    dx bipolar   PONV (postoperative nausea and vomiting)    Sleep apnea 04/2011   does not wear CPAP   Tardive dyskinesia     SURGICAL HISTORY: Past Surgical History:  Procedure Laterality Date   ABDOMINAL HYSTERECTOMY     BIOPSY  08/17/2023   Procedure: BIOPSY;  Surgeon: Avram Lupita BRAVO, MD;  Location: [facility];  Service: Gastroenterology;;   COLONOSCOPY     DILATION AND CURETTAGE OF UTERUS  1981   abnormal pap   ESOPHAGOGASTRODUODENOSCOPY (EGD) WITH PROPOFOL  N/A 08/17/2023   Procedure: ESOPHAGOGASTRODUODENOSCOPY (EGD) WITH PROPOFOL ;  Surgeon: Avram Lupita BRAVO, MD;  Location: [facility];  Service: Gastroenterology;  Laterality: N/A;   HOT HEMOSTASIS N/A 08/17/2023   Procedure: HOT HEMOSTASIS (ARGON PLASMA COAGULATION/BICAP);  Surgeon: Avram Lupita BRAVO, MD;  Location: [facility];  Service: Gastroenterology;  Laterality: N/A;   IR IMAGING GUIDED PORT INSERTION  11/20/2023   IR THORACENTESIS ASP PLEURAL SPACE W/IMG GUIDE  09/13/2023   IR THORACENTESIS ASP PLEURAL SPACE W/IMG GUIDE  11/20/2023   KNEE ARTHROSCOPY Left 2007   x 2   LUMBAR LAMINECTOMY/DECOMPRESSION MICRODISCECTOMY  05/31/2011   Procedure: LUMBAR LAMINECTOMY/DECOMPRESSION MICRODISCECTOMY;  Surgeon: Reyes JAYSON Billing;  Location: [facility];  Service: Orthopedics;  Laterality: Left;  Decompression Lumbar four to five and  Lumbar five to Sacral one on Left  (X-Ray)   MASTECTOMY W/ SENTINEL NODE BIOPSY Right 09/16/2018   Procedure: RIGHT MASTECTOMY WITH RIGHT AXILLARY SENTINEL LYMPH NODE BIOPSY;  Surgeon: Ethyl Lenis, MD;  Location: [facility];  Service: General;  Laterality: Right;   PARTIAL HYSTERECTOMY  1982   PORTACATH PLACEMENT Left 10/31/2018   Procedure: INSERTION PORT-A-CATH WITH ULTRASOUND;  Surgeon: Ethyl Lenis, MD;  Location: [facility];  Service: General;  Laterality: Left;   TONSILLECTOMY     as child   TOTAL KNEE ARTHROPLASTY Left 12/18/2005    SOCIAL HISTORY: Social History    Socioeconomic History   Marital status: Married    Spouse name: Lynwood   Number of children: 2   Years of education: 12   Highest education level: Not on file  Occupational History   Occupation: Disabled  Tobacco Use   Smoking status: Never   Smokeless tobacco: Never  Vaping Use   Vaping status: Never Used  Substance and Sexual Activity   Alcohol use: No   Drug use: No   Sexual activity: Yes    Birth control/protection: Post-menopausal, Surgical  Other Topics Concern   Not on file  Social History Narrative   Tea daily.  Rarely has caffeine    Social Drivers of Corporate investment banker Strain: Patient Declined (10/22/2023)   Overall Financial Resource Strain (CARDIA)    Difficulty of Paying Living Expenses: Patient declined  Food Insecurity: Patient Declined (10/22/2023)   Hunger Vital Sign    Worried About Running Out of Food in the Last Year: Patient declined    Barista  in the Last Year: Patient declined  Transportation Needs: No Transportation Needs (10/22/2023)   PRAPARE - Administrator, Civil Service (Medical): No    Lack of Transportation (Non-Medical): No  Physical Activity: Unknown (10/22/2023)   Exercise Vital Sign    Days of Exercise per Week: 0 days    Minutes of Exercise per Session: Not on file  Stress: Stress Concern Present (10/22/2023)   [employer] of Occupational Health - Occupational Stress Questionnaire    Feeling of Stress : To some extent  Social Connections: Unknown (10/22/2023)   Social Connection and Isolation Panel    Frequency of Communication with Friends and Family: More than three times a week    Frequency of Social Gatherings with Friends and Family: Twice a week    Attends Religious Services: Patient declined    Database administrator or Organizations: No    Attends Banker Meetings: Never    Marital Status: Married  Catering manager Violence: Not At Risk (08/16/2023)   Humiliation, Afraid, Rape,  and Kick questionnaire    Fear of Current or Ex-Partner: No    Emotionally Abused: No    Physically Abused: No    Sexually Abused: No    FAMILY HISTORY: Family History  Problem Relation Age of Onset   Anesthesia problems Mother    Heart disease Mother        CHF, atrial fib   Heart failure Mother    Anesthesia problems Sister    Colon polyps Father    Heart disease Father        Fluid around the heart   Hypertension Sister    Breast cancer Maternal Aunt    Colon cancer Maternal Aunt    Prostate cancer Neg Hx    Ovarian cancer Neg Hx     Review of Systems  Constitutional:  Negative for appetite change, chills, fatigue, fever and unexpected weight change.  HENT:   Negative for hearing loss, lump/mass and trouble swallowing.   Eyes:  Negative for eye problems and icterus.  Respiratory:  Negative for chest tightness, cough and shortness of breath.   Cardiovascular:  Negative for chest pain, leg swelling and palpitations.  Gastrointestinal:  Negative for abdominal distention, abdominal pain, constipation, diarrhea, nausea and vomiting.  Endocrine: Negative for hot flashes.  Genitourinary:  Negative for difficulty urinating.   Musculoskeletal:  Negative for arthralgias.  Skin:  Negative for itching and rash.  Neurological:  Negative for dizziness, extremity weakness, headaches and numbness.  Hematological:  Negative for adenopathy. Does not bruise/bleed easily.  Psychiatric/Behavioral:  Negative for depression. The patient is not nervous/anxious.       PHYSICAL EXAMINATION  BP (!) 125/56 (BP Location: Left Wrist, Patient Position: Sitting)   Pulse 88   Temp 98.3 F (36.8 C) (Temporal)   Resp 15   Wt 154 lb 6.4 oz (70 kg)   SpO2 90%   BMI 26.50 kg/m    She appears to be in no acute distress CTA, minimal air entry at bases. RRR BLE swelling, symmetrical.   LABORATORY DATA:  CBC    Component Value Date/Time   WBC 2.4 (L) 02/13/2024 1127   WBC 2.9 (L)  10/16/2023 1103   RBC 2.75 (L) 02/13/2024 1127   HGB 8.9 (L) 02/13/2024 1127   HGB 12.0 06/05/2022 1024   HCT 27.5 (L) 02/13/2024 1127   HCT 37.4 06/05/2022 1024   PLT 119 (L) 02/13/2024 1127   PLT 251 06/05/2022 1024  MCV 100.0 02/13/2024 1127   MCV 86 06/05/2022 1024   MCH 32.4 02/13/2024 1127   MCHC 32.4 02/13/2024 1127   RDW 16.1 (H) 02/13/2024 1127   RDW 15.5 (H) 06/05/2022 1024   LYMPHSABS 0.3 (L) 02/13/2024 1127   LYMPHSABS 0.9 06/05/2022 1024   MONOABS 0.3 02/13/2024 1127   EOSABS 0.1 02/13/2024 1127   EOSABS 0.1 06/05/2022 1024   BASOSABS 0.0 02/13/2024 1127   BASOSABS 0.1 06/05/2022 1024    CMP     Component Value Date/Time   NA 140 02/13/2024 1127   NA 145 (H) 06/05/2022 1024   K 4.1 02/13/2024 1127   CL 106 02/13/2024 1127   CO2 30 02/13/2024 1127   GLUCOSE 122 (H) 02/13/2024 1127   BUN 15 02/13/2024 1127   BUN 17 06/05/2022 1024   CREATININE 0.74 02/13/2024 1127   CALCIUM 8.6 (L) 02/13/2024 1127   PROT 5.5 (L) 02/13/2024 1127   PROT 6.6 06/05/2022 1024   ALBUMIN 3.1 (L) 02/13/2024 1127   ALBUMIN 4.0 06/05/2022 1024   AST 40 02/13/2024 1127   ALT 19 02/13/2024 1127   ALKPHOS 197 (H) 02/13/2024 1127   BILITOT 0.5 02/13/2024 1127   GFRNONAA >60 02/13/2024 1127   GFRAA 81 02/03/2020 1049   GFRAA >60 12/19/2018 1434       ASSESSMENT and THERAPY PLAN:   No problem-specific Assessment & Plan notes found for this encounter.  Assessment and Plan Assessment & Plan Malignant neoplasm of liver Liver lesion increased from 3.4 cm to 3.8 cm, indicating slight progression. Current Verzenio  dosing not well tolerated due to low blood counts. Disease not rapidly progressing, allowing treatment adjustments.Once again they want us  to titrate verzenio  to 100 mg PO BID one day followed by 100 mg po daily the following day. - Since she tolerated medication poorly and since disease is not rapidly progressing, agreed this was a reasonable approach. We can try this  dose, track tumor markers and if there is concern for progression, we can try switching to orserdu or xeloda.    Metastatic breast cancer On Verzenio  with alternating dosing. Tumor markers pending. Increased fatigue noted but no significant symptom worsening. Tolerating dose without transfusions. Faslodex  due today. - Administer Faslodex  injection today. - Continue current Verzenio  dosing schedule. - Monitor tumor markers. - Reassess in two weeks.  Anemia in neoplastic disease Hemoglobin at 8.9, slight improvement. No transfusions needed.  Fatigue related to malignancy and therapy Persistent fatigue, not worsened by Verzenio . Poor sleep contributing. Nutritional support discussed. - Encourage nutritional support with Boost shakes, preferably chocolate flavor, possibly with added ice cream for increased caloric intake.  Cough and dyspnea under cancer therapy Deep cough for two weeks, intermittent dyspnea. Uses supplemental oxygen with relief. Oxygen saturation 99% on room air.   - Continue using supplemental oxygen as needed. Monitor cough and dyspnea, and report any worsening symptoms. No major concern for pneumonitis,   Anxiety related to chronic illness Anxiety may exacerbate dyspnea.   All questions were answered. The patient knows to call the clinic with any problems, questions or concerns. We can certainly see the patient much sooner if necessary.  Total encounter time:40 minutes*in face-to-face visit time, chart review, lab review, care coordination, order entry, and documentation of the encounter time.  *Total Encounter Time as defined by the Centers for Medicare and Medicaid Services includes, in addition to the face-to-face time of a patient visit (documented in the note above) non-face-to-face time: obtaining and reviewing outside history, ordering and  reviewing medications, tests or procedures, care coordination (communications with other health care professionals or  caregivers) and documentation in the medical record.

## 2024-02-13 NOTE — Patient Instructions (Signed)
 USING EMLA  CREAM FOR YOUR PORT ACCESS Comfort starts with preparation.  What is EMLA  Cream? EMLA  cream is a prescription numbing medication (a topical anesthetic) used to reduce pain before your port is accessed for lab draws, chemotherapy, or other treatments.   WHEN TO USE:  - Apply at least 1 hour before your scheduled port access appointment.   Please note: You may not use the EMLA  cream within two weeks (14 days) of port placement.  The EMLA  cream can dissolve the surgical glue, Dermabond, and put you at risk of infection.  HOW TO APPLY EMLA  CREAM:  1. Wash your hands.   2. Apply a quarter size dollop (1/2 tablespoon) of the EMLA  cream directly over your port site. This should be applied at least one hour before your port is accessed. This is the rounded part that you can feel under your skin.    3. Do NOT rub it in. The cream will be resting on your skin.  4. Cover with a dressing: Use a clear, occlusive (airtight) dressing like Tegaderm, plastic wrap with medical tape or press n seal (about a 5" x 5" piece). Make sure the cream is sealed under the dressing. This will help the cream absorb and it keeps the cream from getting on your clothes.  5. If you do not use disposable gloves to apply, please ensure you wash your hands after application.  6. Leave on for 60 minutes. - Do not leave on longer than 2 hours.   7. At your appointment, your nurse will remove the dressing and clean the area before accessing your port. Please ensure that you wear clothing that will make it easy to access your port. Shirts with buttons or a zipper down the front. Also scoop neck shirts or bouses work well too.   REMINDERS:  - Do not apply to the port site for at least 14 days after insertion, or until the incision is completely healed. - Do not use on broken, irritated, or infected skin.  - Only use as prescribed (usually no more than once per day).  - Store at room temperature away from heat or  direct sunlight. - If you have any allergies to numbing medications, tell your nurse or provider.  - If you require a sensitive skin dressing, please let the nurse accessing your port know. They will be sure to utilize the appropriate dressing based on your needs.   QUESTIONS? If you are unsure how or when to apply EMLA , call your cancer center team. We are here to help make your treatment as comfortable as possible.

## 2024-02-14 LAB — CANCER ANTIGEN 27.29: CA 27.29: 1222.8 U/mL — ABNORMAL HIGH (ref 0.0–38.6)

## 2024-02-15 DIAGNOSIS — C50411 Malignant neoplasm of upper-outer quadrant of right female breast: Secondary | ICD-10-CM | POA: Diagnosis not present

## 2024-02-15 DIAGNOSIS — R0602 Shortness of breath: Secondary | ICD-10-CM | POA: Diagnosis not present

## 2024-02-19 ENCOUNTER — Other Ambulatory Visit: Payer: Self-pay

## 2024-02-19 NOTE — Progress Notes (Signed)
 Specialty Pharmacy Ongoing Clinical Assessment Note  Doris Smith is a 73 y.o. female who is being followed by the specialty pharmacy service for RxSp Oncology   Patient's specialty medication(s) reviewed today: Abemaciclib  (VERZENIO )   Missed doses in the last 4 weeks: 0   Patient/Caregiver did not have any additional questions or concerns.   Therapeutic benefit summary: Patient is achieving benefit   Adverse events/side effects summary: No adverse events/side effects   Patient's therapy is appropriate to: Continue    Goals Addressed             This Visit's Progress    Maintain optimal adherence to therapy       Patient is on track. Patient will maintain adherence         Follow up: 3 months  Darrek Leasure M Avanell Banwart Specialty Pharmacist

## 2024-02-20 ENCOUNTER — Other Ambulatory Visit: Payer: Self-pay | Admitting: Hematology and Oncology

## 2024-02-27 ENCOUNTER — Other Ambulatory Visit: Payer: Self-pay | Admitting: Nurse Practitioner

## 2024-02-27 ENCOUNTER — Other Ambulatory Visit (HOSPITAL_COMMUNITY): Payer: Self-pay

## 2024-02-27 DIAGNOSIS — G893 Neoplasm related pain (acute) (chronic): Secondary | ICD-10-CM

## 2024-02-27 DIAGNOSIS — Z170 Estrogen receptor positive status [ER+]: Secondary | ICD-10-CM

## 2024-02-27 DIAGNOSIS — H5203 Hypermetropia, bilateral: Secondary | ICD-10-CM | POA: Diagnosis not present

## 2024-02-27 DIAGNOSIS — Z515 Encounter for palliative care: Secondary | ICD-10-CM

## 2024-02-27 MED ORDER — OXYCODONE HCL ER 15 MG PO T12A
15.0000 mg | EXTENDED_RELEASE_TABLET | Freq: Two times a day (BID) | ORAL | 0 refills | Status: DC
  Filled 2024-02-27: qty 60, 30d supply, fill #0

## 2024-02-28 ENCOUNTER — Other Ambulatory Visit (HOSPITAL_COMMUNITY): Payer: Self-pay

## 2024-02-28 ENCOUNTER — Inpatient Hospital Stay

## 2024-02-28 ENCOUNTER — Other Ambulatory Visit: Payer: Self-pay

## 2024-02-28 ENCOUNTER — Inpatient Hospital Stay: Admitting: Hematology and Oncology

## 2024-02-28 VITALS — BP 107/59 | HR 78 | Temp 99.1°F | Resp 17 | Wt 153.9 lb

## 2024-02-28 DIAGNOSIS — Z83719 Family history of colon polyps, unspecified: Secondary | ICD-10-CM | POA: Diagnosis not present

## 2024-02-28 DIAGNOSIS — Z1732 Human epidermal growth factor receptor 2 negative status: Secondary | ICD-10-CM | POA: Diagnosis not present

## 2024-02-28 DIAGNOSIS — G2581 Restless legs syndrome: Secondary | ICD-10-CM | POA: Diagnosis not present

## 2024-02-28 DIAGNOSIS — D630 Anemia in neoplastic disease: Secondary | ICD-10-CM | POA: Diagnosis not present

## 2024-02-28 DIAGNOSIS — I480 Paroxysmal atrial fibrillation: Secondary | ICD-10-CM | POA: Diagnosis not present

## 2024-02-28 DIAGNOSIS — J910 Malignant pleural effusion: Secondary | ICD-10-CM | POA: Diagnosis not present

## 2024-02-28 DIAGNOSIS — E440 Moderate protein-calorie malnutrition: Secondary | ICD-10-CM | POA: Diagnosis not present

## 2024-02-28 DIAGNOSIS — Z90710 Acquired absence of both cervix and uterus: Secondary | ICD-10-CM | POA: Diagnosis not present

## 2024-02-28 DIAGNOSIS — K259 Gastric ulcer, unspecified as acute or chronic, without hemorrhage or perforation: Secondary | ICD-10-CM | POA: Diagnosis not present

## 2024-02-28 DIAGNOSIS — Z1721 Progesterone receptor positive status: Secondary | ICD-10-CM | POA: Diagnosis not present

## 2024-02-28 DIAGNOSIS — K589 Irritable bowel syndrome without diarrhea: Secondary | ICD-10-CM | POA: Diagnosis not present

## 2024-02-28 DIAGNOSIS — Z170 Estrogen receptor positive status [ER+]: Secondary | ICD-10-CM | POA: Diagnosis not present

## 2024-02-28 DIAGNOSIS — C7951 Secondary malignant neoplasm of bone: Secondary | ICD-10-CM | POA: Diagnosis not present

## 2024-02-28 DIAGNOSIS — R0602 Shortness of breath: Secondary | ICD-10-CM | POA: Diagnosis not present

## 2024-02-28 DIAGNOSIS — F419 Anxiety disorder, unspecified: Secondary | ICD-10-CM | POA: Diagnosis not present

## 2024-02-28 DIAGNOSIS — C50411 Malignant neoplasm of upper-outer quadrant of right female breast: Secondary | ICD-10-CM | POA: Diagnosis not present

## 2024-02-28 DIAGNOSIS — Z79899 Other long term (current) drug therapy: Secondary | ICD-10-CM | POA: Diagnosis not present

## 2024-02-28 DIAGNOSIS — G893 Neoplasm related pain (acute) (chronic): Secondary | ICD-10-CM

## 2024-02-28 DIAGNOSIS — Z5111 Encounter for antineoplastic chemotherapy: Secondary | ICD-10-CM | POA: Diagnosis not present

## 2024-02-28 DIAGNOSIS — K921 Melena: Secondary | ICD-10-CM | POA: Diagnosis not present

## 2024-02-28 DIAGNOSIS — K31819 Angiodysplasia of stomach and duodenum without bleeding: Secondary | ICD-10-CM | POA: Diagnosis not present

## 2024-02-28 DIAGNOSIS — I10 Essential (primary) hypertension: Secondary | ICD-10-CM | POA: Diagnosis not present

## 2024-02-28 DIAGNOSIS — C7800 Secondary malignant neoplasm of unspecified lung: Secondary | ICD-10-CM | POA: Diagnosis not present

## 2024-02-28 DIAGNOSIS — R530 Neoplastic (malignant) related fatigue: Secondary | ICD-10-CM | POA: Diagnosis not present

## 2024-02-28 DIAGNOSIS — Z8249 Family history of ischemic heart disease and other diseases of the circulatory system: Secondary | ICD-10-CM | POA: Diagnosis not present

## 2024-02-28 LAB — CMP (CANCER CENTER ONLY)
ALT: 18 U/L (ref 0–44)
AST: 42 U/L — ABNORMAL HIGH (ref 15–41)
Albumin: 3.1 g/dL — ABNORMAL LOW (ref 3.5–5.0)
Alkaline Phosphatase: 195 U/L — ABNORMAL HIGH (ref 38–126)
Anion gap: 3 — ABNORMAL LOW (ref 5–15)
BUN: 13 mg/dL (ref 8–23)
CO2: 30 mmol/L (ref 22–32)
Calcium: 8.7 mg/dL — ABNORMAL LOW (ref 8.9–10.3)
Chloride: 108 mmol/L (ref 98–111)
Creatinine: 0.76 mg/dL (ref 0.44–1.00)
GFR, Estimated: >60 mL/min (ref >60)
Glucose, Bld: 131 mg/dL — ABNORMAL HIGH (ref 70–99)
Potassium: 3.8 mmol/L (ref 3.5–5.1)
Sodium: 141 mmol/L (ref 135–145)
Total Bilirubin: 0.4 mg/dL (ref 0.0–1.2)
Total Protein: 5.3 g/dL — ABNORMAL LOW (ref 6.5–8.1)

## 2024-02-28 LAB — CBC WITH DIFFERENTIAL (CANCER CENTER ONLY)
Abs Immature Granulocytes: 0.01 K/uL (ref 0.00–0.07)
Basophils Absolute: 0.1 K/uL (ref 0.0–0.1)
Basophils Relative: 2 %
Eosinophils Absolute: 0.1 K/uL (ref 0.0–0.5)
Eosinophils Relative: 3 %
HCT: 24 % — ABNORMAL LOW (ref 36.0–46.0)
Hemoglobin: 7.9 g/dL — ABNORMAL LOW (ref 12.0–15.0)
Immature Granulocytes: 0 %
Lymphocytes Relative: 13 %
Lymphs Abs: 0.3 K/uL — ABNORMAL LOW (ref 0.7–4.0)
MCH: 33.3 pg (ref 26.0–34.0)
MCHC: 32.9 g/dL (ref 30.0–36.0)
MCV: 101.3 fL — ABNORMAL HIGH (ref 80.0–100.0)
Monocytes Absolute: 0.2 K/uL (ref 0.1–1.0)
Monocytes Relative: 10 %
Neutro Abs: 1.7 K/uL (ref 1.7–7.7)
Neutrophils Relative %: 72 %
Platelet Count: 102 K/uL — ABNORMAL LOW (ref 150–400)
RBC: 2.37 MIL/uL — ABNORMAL LOW (ref 3.87–5.11)
RDW: 17.2 % — ABNORMAL HIGH (ref 11.5–15.5)
WBC Count: 2.4 K/uL — ABNORMAL LOW (ref 4.0–10.5)
nRBC: 0 % (ref 0.0–0.2)

## 2024-02-28 MED ORDER — SODIUM CHLORIDE 0.9% FLUSH
10.0000 mL | Freq: Once | INTRAVENOUS | Status: AC | PRN
  Administered 2024-02-28: 10 mL

## 2024-02-28 NOTE — Progress Notes (Signed)
 [facility] Cancer Follow up:    [name], FNP [address] [place] [address]   DIAGNOSIS:  Cancer Staging  Malignant neoplasm of upper-outer quadrant of right breast in female, estrogen receptor positive (HCC) Staging form: Breast, AJCC 8th Edition - Pathologic: Stage IB (pT3, pN5mi, cM0, G2, ER+, PR+, HER2-) - Signed by Crawford Morna Pickle, NP on 10/02/2018 Multigene prognostic tests performed: MammaPrint Histologic grading system: 3 grade system - Clinical stage from 12/28/2022: Stage IV (rcT2, rcN0, rcM1, G3, ER+, PR+, HER2-) - Signed by Loretha Ash, MD on 12/28/2022 Stage prefix: Recurrence Histologic grading system: 3 grade system Laterality: Right Staged by: Pathologist and managing physician Stage used in treatment planning: Yes National guidelines used in treatment planning: Yes Type of national guideline used in treatment planning: NCCN   SUMMARY OF ONCOLOGIC HISTORY: Oncology History  Malignant neoplasm of upper-outer quadrant of right breast in female, estrogen receptor positive (HCC)  08/08/2018 Initial Diagnosis   status post right breast biopsy 08/02/2018 for a clinical T3 N0, stage IIA invasive lobular carcinoma, grade 2, estrogen receptor strongly positive, progesterone receptor 1% positive, with no HER-2 amplification and an MIB-1 of 1%.             (a) CT scan of the head and chest, without contrast 08/23/2018 showed nonspecific 0.4 cm left lower lobe pulmonary nodule, no definitive metastatic disease             (b) bone scan 08/23/2018 shows multiple spinal areas of abnormal uptake, but             (c) total spinal MRI 09/03/2018 finds bone scan findings to be secondary to degenerative disease, no evidence of metastatic disease.   08/2018 - 10/2022 Anti-estrogen oral therapy   Anastrozole ; discontinued 10/14/2018 in preparation for chemotherapy, resumed October 2020   09/16/2018 Surgery   Right mastectomy Jeoffrey)  [phone]): Invasive Lobular Carcinoma, 10.5 cm, grade 2, negative margins. 1 of 5 lymph nodes positive for carcinoma.   09/16/2018 Miscellaneous   MammaPrint high risk suggests a 5-year metastasis free survival of 93% with chemotherapy, with an absolute chemotherapy benefit in the greater than 12% range    09/16/2018 Miscellaneous   Caris testing on mastectomy sample (09/16/2018) showed stable MSI and proficient mismatch repair status, with a low mutational burden; BRCA 1 and 2 were not mutated, PI K3 was not mutated, ER B B2 was not mutated, and AKT 1 was not mutated. 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 Administration: 0.25 mg (11/05/2018), 0.25 mg (11/26/2018), 0.25 mg (12/17/2018), 0.25 mg (01/07/2019), 0.25 mg (01/28/2019), 0.25 mg (02/18/2019), 0.25 mg (03/11/2019), 0.25 mg (04/02/2019)  methotrexate  (PF) chemo injection 84 mg, 39.8 mg/m2 = 84.5 mg, Intravenous,  Once, 5 of 5 cycles. Administration: 84 mg (11/05/2018), 84 mg (11/26/2018), 84 mg (12/17/2018), 84 mg (03/11/2019), 84 mg (04/02/2019)  pegfilgrastim -cbqv (UDENYCA ) injection 6 mg, 6 mg, Subcutaneous, Once, 6 of 6 cycles. Administration: 6 mg (12/19/2018)  cyclophosphamide  (CYTOXAN ) 1,260 mg in sodium chloride  0.9 % 250 mL chemo infusion, 600 mg/m2 = 1,260 mg, Intravenous,  Once, 8 of 8 cycle. Administration: 1,260 mg (11/05/2018), 1,260 mg (11/26/2018), 1,260 mg (12/17/2018), 1,260 mg (01/07/2019), 1,260 mg (01/28/2019), 1,260 mg (02/18/2019), 1,260 mg (03/11/2019), 1,260 mg (04/02/2019)  fluorouracil  (ADRUCIL ) chemo injection 1,250  mg, 600 mg/m2 = 1,250 mg, Intravenous,  Once, 8 of 8 cycles. Administration: 1,250 mg (11/05/2018), 1,250 mg (11/26/2018), 1,250 mg (12/17/2018), 1,250 mg (01/07/2019), 1,250 mg (01/28/2019), 1,250 mg (02/18/2019), 1,250 mg (03/11/2019), 1,250 mg  (04/02/2019).    12/31/2018 - 02/19/2019 Radiation Therapy   The patient initially received a dose of 50.4 Gy in 28 fractions to the chest wall and supraclavicular region. This was delivered using a 3-D conformal, 4 field technique. The patient then received a boost to the mastectomy scar. This delivered an additional 10 Gy in 5 fractions using an en face electron field. The total dose was 60.4 Gy.   09/12/2022 Imaging   Bone scan on 09/12/2022 shows uptake in ribs, manubrium. Widespread osseous metastasis confirmed on PET scan that was completed on October 06, 2022. Right iliac crest biopsy demonstrated metastatic carcinoma consistent with patient's known breast carcinoma. ER 90% positive, PR 90% positive, Ki67 10%, HER2 negative.    10/10/2022 Treatment Plan Change   Faslodex  beginning 10/10/2022; Ibrance  beginning 12/20/2022; Zometa  every 12 weeks 11/07/2022    10/23/2022 - 11/03/2022 Radiation Therapy   Palliative Radiation 10/23/2022-11/03/2022:  left chest wall, lower T spine, and right proximal hip/pelvis were each treated to 30 GY in 10 fractions.      CURRENT THERAPY: Fulvestrant , Zometa , Verzenio   INTERVAL HISTORY:  Discussed the use of AI scribe software for clinical note transcription with the patient, who gave verbal consent to proceed.  History of Present Illness  Doris Smith is a 73 year old female with breast cancer who presents for follow-up regarding her treatment and symptoms.  She is currently on Verzenio  for metastatic breast cancer and tolerates the medication well, though she experiences intermittent nausea and vomiting. Recently, she vomited coffee without prior food intake. Her appetite is inconsistent, describing it as a 'roller coaster,' and she finds some foods tolerable only in small amounts. To maintain nutrition, she makes shakes with Boost and ice cream, which she finds palatable.  She uses oxygen therapy, particularly at night, which has been beneficial. She  goes to bed around 9:00 to 9:30 PM with the oxygen on, wakes up around 2:30 to 3:00 AM to use the bathroom, and sometimes puts the oxygen back on afterwards. She also has portable oxygen for outside use, which she uses as needed. She reports that she does not currently feel the need for drainage.  She experiences pain in her spine, particularly in the middle, which radiates outward. She is taking pain medication to manage this discomfort. Additionally, she has swelling in her knee, which has been previously identified as needing replacement, similar to her other knee.  Her bowel movements are generally okay, though sometimes difficult, but not painful.  She is in good spirits, looking forward to returning home and engaging in activities that bring her joy, such as decorating for Christmas. She is looking forward to returning to her home in [place], which is about 40 miles away from her current location. She has two sons, one living in [place] and the other nearby, ensuring that no family member is more than an hour away. She enjoys Western and [place] themes and has a fondness for [employer] and a new type of cow she has named Scientist, clinical (histocompatibility and immunogenetics).   Patient Active Problem List   Diagnosis Date Noted   Malnutrition of moderate degree 08/17/2023   GAVE (gastric antral vascular ectasia) 08/17/2023   Gastric erosion 08/17/2023   Melena 08/16/2023   Anemia associated with acute blood  loss 08/16/2023   Peripheral edema 12/29/2022   Restless leg syndrome 10/03/2021   Anxiety associated with depression 06/10/2020   Port-A-Cath in place 11/12/2018   Malignant neoplasm of upper-outer quadrant of right breast in female, estrogen receptor positive (HCC) 08/08/2018   Right knee pain 03/01/2015   Aortic atherosclerosis (HCC) 12/28/2014   Obesity (BMI 30-39.9) 04/09/2014   Insomnia due to stress 04/09/2014   Atrial fibrillation (HCC) 10/01/2013   GERD (gastroesophageal reflux disease) 10/01/2013   Bipolar  disorder (HCC) 09/27/2007   Essential hypertension 09/27/2007   ALLERGIC RHINITIS 09/27/2007   IRRITABLE BOWEL SYNDROME 09/27/2007   Arthropathy 09/27/2007   DEGENERATIVE DISC DISEASE, LUMBOSACRAL SPINE 09/27/2007    is allergic to iohexol, codeine, palonosetron , prednisone , sulfonamide derivatives, celecoxib, sulfa antibiotics, and vitamin b12.  MEDICAL HISTORY: Past Medical History:  Diagnosis Date   A-fib (HCC) 11/2012   PAF   Anxiety    takes Ativan    Asthma 04/07/2011   dx   Bipolar disorder (HCC)    Cancer (HCC)    Right breast   Depression    Early cataracts, bilateral    Fatty liver    Fibromyalgia    GERD (gastroesophageal reflux disease)    Irritable bowel syndrome    Mental disorder    dx bipolar   PONV (postoperative nausea and vomiting)    Sleep apnea 04/2011   does not wear CPAP   Tardive dyskinesia     SURGICAL HISTORY: Past Surgical History:  Procedure Laterality Date   ABDOMINAL HYSTERECTOMY     BIOPSY  08/17/2023   Procedure: BIOPSY;  Surgeon: Avram Lupita BRAVO, MD;  Location: [facility];  Service: Gastroenterology;;   COLONOSCOPY     DILATION AND CURETTAGE OF UTERUS  1981   abnormal pap   ESOPHAGOGASTRODUODENOSCOPY (EGD) WITH PROPOFOL  N/A 08/17/2023   Procedure: ESOPHAGOGASTRODUODENOSCOPY (EGD) WITH PROPOFOL ;  Surgeon: Avram Lupita BRAVO, MD;  Location: [facility];  Service: Gastroenterology;  Laterality: N/A;   HOT HEMOSTASIS N/A 08/17/2023   Procedure: HOT HEMOSTASIS (ARGON PLASMA COAGULATION/BICAP);  Surgeon: Avram Lupita BRAVO, MD;  Location: [facility];  Service: Gastroenterology;  Laterality: N/A;   IR IMAGING GUIDED PORT INSERTION  11/20/2023   IR THORACENTESIS ASP PLEURAL SPACE W/IMG GUIDE  09/13/2023   IR THORACENTESIS ASP PLEURAL SPACE W/IMG GUIDE  11/20/2023   KNEE ARTHROSCOPY Left 2007   x 2   LUMBAR LAMINECTOMY/DECOMPRESSION MICRODISCECTOMY  05/31/2011   Procedure: LUMBAR LAMINECTOMY/DECOMPRESSION MICRODISCECTOMY;  Surgeon: Reyes JAYSON Billing;   Location: [facility];  Service: Orthopedics;  Laterality: Left;  Decompression Lumbar four to five and  Lumbar five to Sacral one on Left  (X-Ray)   MASTECTOMY W/ SENTINEL NODE BIOPSY Right 09/16/2018   Procedure: RIGHT MASTECTOMY WITH RIGHT AXILLARY SENTINEL LYMPH NODE BIOPSY;  Surgeon: Ethyl Lenis, MD;  Location[facility];  Service: General;  Laterality: Right;   PARTIAL HYSTERECTOMY  1982   PORTACATH PLACEMENT Left 10/31/2018   Procedure: INSERTION PORT-A-CATH WITH ULTRASOUND;  Surgeon: Ethyl Lenis, MD;  Location: [facility];  Service: General;  Laterality: Left;   TONSILLECTOMY     as child   TOTAL KNEE ARTHROPLASTY Left 12/18/2005    SOCIAL HISTORY: Social History   Socioeconomic History   Marital status: Married    Spouse name: Lynwood   Number of children: 2   Years of education: 12   Highest education level: Not on file  Occupational History   Occupation: Disabled  Tobacco Use   Smoking status: Never   Smokeless  tobacco: Never  Vaping Use   Vaping status: Never Used  Substance and Sexual Activity   Alcohol use: No   Drug use: No   Sexual activity: Yes    Birth control/protection: Post-menopausal, Surgical  Other Topics Concern   Not on file  Social History Narrative   Tea daily.  Rarely has caffeine    Social Drivers of Corporate investment banker Strain: Patient Declined (10/22/2023)   Overall Financial Resource Strain (CARDIA)    Difficulty of Paying Living Expenses: Patient declined  Food Insecurity: Patient Declined (10/22/2023)   Hunger Vital Sign    Worried About Running Out of Food in the Last Year: Patient declined    Ran Out of Food in the Last Year: Patient declined  Transportation Needs: No Transportation Needs (10/22/2023)   PRAPARE - Administrator, Civil Service (Medical): No    Lack of Transportation (Non-Medical): No  Physical Activity: Unknown (10/22/2023)   Exercise Vital Sign    Days of Exercise per Week: 0 days    Minutes  of Exercise per Session: Not on file  Stress: Stress Concern Present (10/22/2023)   [employer] of Occupational Health - Occupational Stress Questionnaire    Feeling of Stress : To some extent  Social Connections: Unknown (10/22/2023)   Social Connection and Isolation Panel    Frequency of Communication with Friends and Family: More than three times a week    Frequency of Social Gatherings with Friends and Family: Twice a week    Attends Religious Services: Patient declined    Database administrator or Organizations: No    Attends Banker Meetings: Never    Marital Status: Married  Catering manager Violence: Not At Risk (08/16/2023)   Humiliation, Afraid, Rape, and Kick questionnaire    Fear of Current or Ex-Partner: No    Emotionally Abused: No    Physically Abused: No    Sexually Abused: No    FAMILY HISTORY: Family History  Problem Relation Age of Onset   Anesthesia problems Mother    Heart disease Mother        CHF, atrial fib   Heart failure Mother    Anesthesia problems Sister    Colon polyps Father    Heart disease Father        Fluid around the heart   Hypertension Sister    Breast cancer Maternal Aunt    Colon cancer Maternal Aunt    Prostate cancer Neg Hx    Ovarian cancer Neg Hx     Review of Systems  Constitutional:  Negative for appetite change, chills, fatigue, fever and unexpected weight change.  HENT:   Negative for hearing loss, lump/mass and trouble swallowing.   Eyes:  Negative for eye problems and icterus.  Respiratory:  Negative for chest tightness, cough and shortness of breath.   Cardiovascular:  Negative for chest pain, leg swelling and palpitations.  Gastrointestinal:  Negative for abdominal distention, abdominal pain, constipation, diarrhea, nausea and vomiting.  Endocrine: Negative for hot flashes.  Genitourinary:  Negative for difficulty urinating.   Musculoskeletal:  Negative for arthralgias.  Skin:  Negative for itching  and rash.  Neurological:  Negative for dizziness, extremity weakness, headaches and numbness.  Hematological:  Negative for adenopathy. Does not bruise/bleed easily.  Psychiatric/Behavioral:  Negative for depression. The patient is not nervous/anxious.       PHYSICAL EXAMINATION  BP (!) 107/59 (BP Location: Right Arm, Patient Position: Sitting, Cuff Size: Normal)  Pulse 78   Temp 99.1 F (37.3 C) (Temporal)   Resp 17   Wt 153 lb 14.4 oz (69.8 kg)   SpO2 95%   BMI 26.42 kg/m    She appears to be in no acute distress CTA, minimal air entry at bases. RRR BLE swelling, symmetrical.   LABORATORY DATA:  CBC    Component Value Date/Time   WBC 2.4 (L) 02/28/2024 1042   WBC 2.9 (L) 10/16/2023 1103   RBC 2.37 (L) 02/28/2024 1042   HGB 7.9 (L) 02/28/2024 1042   HGB 12.0 06/05/2022 1024   HCT 24.0 (L) 02/28/2024 1042   HCT 37.4 06/05/2022 1024   PLT 102 (L) 02/28/2024 1042   PLT 251 06/05/2022 1024   MCV 101.3 (H) 02/28/2024 1042   MCV 86 06/05/2022 1024   MCH 33.3 02/28/2024 1042   MCHC 32.9 02/28/2024 1042   RDW 17.2 (H) 02/28/2024 1042   RDW 15.5 (H) 06/05/2022 1024   LYMPHSABS 0.3 (L) 02/28/2024 1042   LYMPHSABS 0.9 06/05/2022 1024   MONOABS 0.2 02/28/2024 1042   EOSABS 0.1 02/28/2024 1042   EOSABS 0.1 06/05/2022 1024   BASOSABS 0.1 02/28/2024 1042   BASOSABS 0.1 06/05/2022 1024    CMP     Component Value Date/Time   NA 141 02/28/2024 1042   NA 145 (H) 06/05/2022 1024   K 3.8 02/28/2024 1042   CL 108 02/28/2024 1042   CO2 30 02/28/2024 1042   GLUCOSE 131 (H) 02/28/2024 1042   BUN 13 02/28/2024 1042   BUN 17 06/05/2022 1024   CREATININE 0.76 02/28/2024 1042   CALCIUM 8.7 (L) 02/28/2024 1042   PROT 5.3 (L) 02/28/2024 1042   PROT 6.6 06/05/2022 1024   ALBUMIN 3.1 (L) 02/28/2024 1042   ALBUMIN 4.0 06/05/2022 1024   AST 42 (H) 02/28/2024 1042   ALT 18 02/28/2024 1042   ALKPHOS 195 (H) 02/28/2024 1042   BILITOT 0.4 02/28/2024 1042   GFRNONAA >60  02/28/2024 1042   GFRAA 81 02/03/2020 1049   GFRAA >60 12/19/2018 1434       ASSESSMENT and THERAPY PLAN:   No problem-specific Assessment & Plan notes found for this encounter.  Assessment and Plan Assessment & Plan Malignant neoplasm of liver Liver lesion increased from 3.4 cm to 3.8 cm, indicating slight progression. Current Verzenio  dosing not well tolerated due to low blood counts. Disease not rapidly progressing, allowing treatment adjustments.Once again they want us  to titrate verzenio  to 100 mg PO BID one day followed by 100 mg po daily the following day. - Since she tolerated medication poorly and since disease is not rapidly progressing, agreed this was a reasonable approach. We can try this dose, track tumor markers and if there is concern for progression, we can try switching to orserdu or xeloda.   Metastatic breast cancer with bone and lung involvement Tumor marker increase suggests potential tumor activity. - Continue Verzenio  and faslodex  at this time - Schedule scan for end of September to assess tumor growth. - Consider switching to orserdu or xeloda at the time of progression.  Malignant pleural effusion Fluid present but saturating well, no current need for drainage. - Monitor for worsening shortness of breath. - Arrange drainage if shortness of breath worsens.  Neoplasm-related chronic pain Chronic pain managed with current regimen. - Continue current pain medication regimen.  Osteoarthritis of knee, status post prior knee replacement, contralateral knee pending replacement Contralateral knee requires replacement, pending breast cancer stabilization. - Monitor breast cancer stability before  proceeding with knee replacement.  Nausea and vomiting Intermittent symptoms, recent episode of vomiting coffee.  Decreased appetite Variable appetite, managing with nutritional shakes. - Continue nutritional shakes with Boost and ice cream at least once a  day.  Constipation Bowel movements difficult but painless. - Continue current bowel regimen.   All questions were answered. The patient knows to call the clinic with any problems, questions or concerns. We can certainly see the patient much sooner if necessary.  Total encounter time:40 minutes*in face-to-face visit time, chart review, lab review, care coordination, order entry, and documentation of the encounter time.  *Total Encounter Time as defined by the Centers for Medicare and Medicaid Services includes, in addition to the face-to-face time of a patient visit (documented in the note above) non-face-to-face time: obtaining and reviewing outside history, ordering and reviewing medications, tests or procedures, care coordination (communications with other health care professionals or caregivers) and documentation in the medical record.

## 2024-02-29 ENCOUNTER — Other Ambulatory Visit (HOSPITAL_COMMUNITY): Payer: Self-pay

## 2024-02-29 LAB — CANCER ANTIGEN 27.29: CA 27.29: 1218.3 U/mL — ABNORMAL HIGH (ref 0.0–38.6)

## 2024-03-01 DIAGNOSIS — C50411 Malignant neoplasm of upper-outer quadrant of right female breast: Secondary | ICD-10-CM | POA: Diagnosis not present

## 2024-03-03 ENCOUNTER — Telehealth: Payer: Self-pay | Admitting: Nurse Practitioner

## 2024-03-03 NOTE — Telephone Encounter (Signed)
 Scheduled appointment per staff message. Talked with the patient and her spouse and thy are aware of the made appointment.

## 2024-03-04 DIAGNOSIS — C787 Secondary malignant neoplasm of liver and intrahepatic bile duct: Secondary | ICD-10-CM | POA: Diagnosis not present

## 2024-03-11 ENCOUNTER — Other Ambulatory Visit: Payer: Self-pay | Admitting: Nurse Practitioner

## 2024-03-11 ENCOUNTER — Telehealth: Payer: Self-pay | Admitting: Family Medicine

## 2024-03-11 DIAGNOSIS — C50411 Malignant neoplasm of upper-outer quadrant of right female breast: Secondary | ICD-10-CM

## 2024-03-11 DIAGNOSIS — R110 Nausea: Secondary | ICD-10-CM

## 2024-03-11 DIAGNOSIS — G2581 Restless legs syndrome: Secondary | ICD-10-CM

## 2024-03-11 DIAGNOSIS — Z515 Encounter for palliative care: Secondary | ICD-10-CM

## 2024-03-11 MED ORDER — ONDANSETRON HCL 8 MG PO TABS: 8.0000 mg | ORAL_TABLET | Freq: Three times a day (TID) | ORAL | 5 refills | Status: DC | PRN

## 2024-03-11 NOTE — Addendum Note (Signed)
 Addended by: Saadiya Wilfong, [name] on: 03/11/2024 03:49 PM   Modules accepted: Orders

## 2024-03-11 NOTE — Telephone Encounter (Signed)
 Copied from CRM [id number]. Topic: Clinical - Prescription Issue >> Mar 11, 2024  8:51 AM Doris Smith wrote: Reason for CRM: Patient states she only received 8 pills for her ondansetron  (ZOFRAN ) 8 MG tablet  Rx instead of the normal 30 days. Patient is requesting a 30 day supply instead of the 9 days. Patient is also requesting a call back [phone]. >> Mar 11, 2024  9:49 AM Nurse Laurann J wrote: Wrong office please send to appropriate provider

## 2024-03-12 ENCOUNTER — Telehealth: Payer: Self-pay

## 2024-03-12 ENCOUNTER — Inpatient Hospital Stay: Attending: Hematology and Oncology

## 2024-03-12 VITALS — BP 104/49 | HR 70 | Temp 98.5°F | Resp 16

## 2024-03-12 DIAGNOSIS — C50411 Malignant neoplasm of upper-outer quadrant of right female breast: Secondary | ICD-10-CM | POA: Diagnosis not present

## 2024-03-12 DIAGNOSIS — D649 Anemia, unspecified: Secondary | ICD-10-CM | POA: Insufficient documentation

## 2024-03-12 DIAGNOSIS — Z79899 Other long term (current) drug therapy: Secondary | ICD-10-CM | POA: Diagnosis not present

## 2024-03-12 DIAGNOSIS — M810 Age-related osteoporosis without current pathological fracture: Secondary | ICD-10-CM | POA: Insufficient documentation

## 2024-03-12 DIAGNOSIS — Z170 Estrogen receptor positive status [ER+]: Secondary | ICD-10-CM | POA: Diagnosis not present

## 2024-03-12 DIAGNOSIS — Z7983 Long term (current) use of bisphosphonates: Secondary | ICD-10-CM | POA: Insufficient documentation

## 2024-03-12 DIAGNOSIS — Z9011 Acquired absence of right breast and nipple: Secondary | ICD-10-CM | POA: Diagnosis not present

## 2024-03-12 DIAGNOSIS — Z1732 Human epidermal growth factor receptor 2 negative status: Secondary | ICD-10-CM | POA: Insufficient documentation

## 2024-03-12 DIAGNOSIS — C7800 Secondary malignant neoplasm of unspecified lung: Secondary | ICD-10-CM | POA: Insufficient documentation

## 2024-03-12 DIAGNOSIS — Z1721 Progesterone receptor positive status: Secondary | ICD-10-CM | POA: Diagnosis not present

## 2024-03-12 DIAGNOSIS — C7951 Secondary malignant neoplasm of bone: Secondary | ICD-10-CM | POA: Diagnosis present

## 2024-03-12 DIAGNOSIS — D696 Thrombocytopenia, unspecified: Secondary | ICD-10-CM | POA: Insufficient documentation

## 2024-03-12 DIAGNOSIS — Z803 Family history of malignant neoplasm of breast: Secondary | ICD-10-CM | POA: Insufficient documentation

## 2024-03-12 DIAGNOSIS — Z5111 Encounter for antineoplastic chemotherapy: Secondary | ICD-10-CM | POA: Insufficient documentation

## 2024-03-12 DIAGNOSIS — Z800 Family history of malignant neoplasm of digestive organs: Secondary | ICD-10-CM | POA: Insufficient documentation

## 2024-03-12 MED ORDER — FULVESTRANT 250 MG/5ML IM SOSY
500.0000 mg | PREFILLED_SYRINGE | Freq: Once | INTRAMUSCULAR | Status: AC
  Administered 2024-03-12: 500 mg via INTRAMUSCULAR
  Filled 2024-03-12: qty 10

## 2024-03-12 NOTE — Telephone Encounter (Signed)
 Received call from pt reporting increased back pain and requesting a back brace to help with pain. Pt reporting taking her long acting OxyContin  but has not taken the percocet in weeks and has instead been taking tylenol . Pt reports still have percocet in her home, instructed to stop tylenol  and try percocet as prescribed. Pt verbalized understanding, no new symptoms mentioned, no new neuropathy, controlled n/v. Pt scheduled reviewed, RN to acquire back brace order. No further needs at this time.

## 2024-03-16 ENCOUNTER — Encounter: Payer: Self-pay | Admitting: Hematology and Oncology

## 2024-03-17 ENCOUNTER — Telehealth: Payer: Self-pay

## 2024-03-17 ENCOUNTER — Other Ambulatory Visit: Payer: Self-pay

## 2024-03-17 ENCOUNTER — Other Ambulatory Visit: Payer: Self-pay | Admitting: Hematology and Oncology

## 2024-03-17 DIAGNOSIS — G893 Neoplasm related pain (acute) (chronic): Secondary | ICD-10-CM

## 2024-03-17 DIAGNOSIS — R0602 Shortness of breath: Secondary | ICD-10-CM | POA: Diagnosis not present

## 2024-03-17 DIAGNOSIS — J910 Malignant pleural effusion: Secondary | ICD-10-CM

## 2024-03-17 DIAGNOSIS — C50411 Malignant neoplasm of upper-outer quadrant of right female breast: Secondary | ICD-10-CM

## 2024-03-17 MED ORDER — ABEMACICLIB 100 MG PO TABS: 100.0000 mg | ORAL_TABLET | Freq: Two times a day (BID) | ORAL | 0 refills | Status: DC

## 2024-03-17 NOTE — Telephone Encounter (Signed)
 Pt called c/o right sided pain in her armpit, cough, congestion, increased fatigue, nausea/vomiting, and SOB x1 week. Pt requested x-ray and lab work to rule out pneumonia or a thoracentesis. Pt instructed to take home covid test prior to bringing in for lab work. Pt verbalized that she would take a home test and call with the results. Waiting for return call from patient to coordinate next steps at this time.

## 2024-03-17 NOTE — Progress Notes (Signed)
 Pt returned call with a negative covid result, orders placed for stat x-ray, labs, and f/u palliative appt. Pt made aware of plan, no questions at this time.

## 2024-03-18 ENCOUNTER — Ambulatory Visit (HOSPITAL_COMMUNITY)
Admission: RE | Admit: 2024-03-18 | Discharge: 2024-03-18 | Disposition: A | Discharge status: Home / Self Care | Source: Ambulatory Visit | Attending: Nurse Practitioner | Admitting: Nurse Practitioner

## 2024-03-18 ENCOUNTER — Encounter: Payer: Self-pay | Admitting: Nurse Practitioner

## 2024-03-18 ENCOUNTER — Inpatient Hospital Stay (HOSPITAL_BASED_OUTPATIENT_CLINIC_OR_DEPARTMENT_OTHER): Admitting: Nurse Practitioner

## 2024-03-18 ENCOUNTER — Other Ambulatory Visit: Payer: Self-pay

## 2024-03-18 ENCOUNTER — Ambulatory Visit

## 2024-03-18 ENCOUNTER — Other Ambulatory Visit: Payer: Self-pay | Admitting: Hematology and Oncology

## 2024-03-18 VITALS — BP 112/63 | HR 75 | Temp 98.4°F | Resp 17 | Ht 64.0 in | Wt 152.0 lb

## 2024-03-18 DIAGNOSIS — C50411 Malignant neoplasm of upper-outer quadrant of right female breast: Secondary | ICD-10-CM

## 2024-03-18 DIAGNOSIS — Z9011 Acquired absence of right breast and nipple: Secondary | ICD-10-CM | POA: Diagnosis not present

## 2024-03-18 DIAGNOSIS — J910 Malignant pleural effusion: Secondary | ICD-10-CM

## 2024-03-18 DIAGNOSIS — Z79899 Other long term (current) drug therapy: Secondary | ICD-10-CM | POA: Diagnosis not present

## 2024-03-18 DIAGNOSIS — D696 Thrombocytopenia, unspecified: Secondary | ICD-10-CM | POA: Diagnosis not present

## 2024-03-18 DIAGNOSIS — Z1721 Progesterone receptor positive status: Secondary | ICD-10-CM | POA: Diagnosis not present

## 2024-03-18 DIAGNOSIS — G893 Neoplasm related pain (acute) (chronic): Secondary | ICD-10-CM | POA: Diagnosis not present

## 2024-03-18 DIAGNOSIS — Z170 Estrogen receptor positive status [ER+]: Secondary | ICD-10-CM | POA: Insufficient documentation

## 2024-03-18 DIAGNOSIS — R0602 Shortness of breath: Secondary | ICD-10-CM | POA: Diagnosis not present

## 2024-03-18 DIAGNOSIS — R530 Neoplastic (malignant) related fatigue: Secondary | ICD-10-CM

## 2024-03-18 DIAGNOSIS — Z7983 Long term (current) use of bisphosphonates: Secondary | ICD-10-CM | POA: Diagnosis not present

## 2024-03-18 DIAGNOSIS — C7800 Secondary malignant neoplasm of unspecified lung: Secondary | ICD-10-CM | POA: Diagnosis not present

## 2024-03-18 DIAGNOSIS — Z5111 Encounter for antineoplastic chemotherapy: Secondary | ICD-10-CM | POA: Diagnosis not present

## 2024-03-18 DIAGNOSIS — D649 Anemia, unspecified: Secondary | ICD-10-CM | POA: Diagnosis not present

## 2024-03-18 DIAGNOSIS — M810 Age-related osteoporosis without current pathological fracture: Secondary | ICD-10-CM | POA: Diagnosis not present

## 2024-03-18 DIAGNOSIS — Z1732 Human epidermal growth factor receptor 2 negative status: Secondary | ICD-10-CM | POA: Diagnosis not present

## 2024-03-18 DIAGNOSIS — Z515 Encounter for palliative care: Secondary | ICD-10-CM | POA: Diagnosis not present

## 2024-03-18 DIAGNOSIS — Z803 Family history of malignant neoplasm of breast: Secondary | ICD-10-CM | POA: Diagnosis not present

## 2024-03-18 DIAGNOSIS — Z800 Family history of malignant neoplasm of digestive organs: Secondary | ICD-10-CM | POA: Diagnosis not present

## 2024-03-18 DIAGNOSIS — C7951 Secondary malignant neoplasm of bone: Secondary | ICD-10-CM | POA: Diagnosis not present

## 2024-03-18 LAB — CBC WITH DIFFERENTIAL (CANCER CENTER ONLY)
Abs Immature Granulocytes: 0.02 K/uL (ref 0.00–0.07)
Basophils Absolute: 0 K/uL (ref 0.0–0.1)
Basophils Relative: 1 %
Eosinophils Absolute: 0.1 K/uL (ref 0.0–0.5)
Eosinophils Relative: 3 %
HCT: 23.1 % — ABNORMAL LOW (ref 36.0–46.0)
Hemoglobin: 7.5 g/dL — ABNORMAL LOW (ref 12.0–15.0)
Immature Granulocytes: 1 %
Lymphocytes Relative: 10 %
Lymphs Abs: 0.3 K/uL — ABNORMAL LOW (ref 0.7–4.0)
MCH: 33.3 pg (ref 26.0–34.0)
MCHC: 32.5 g/dL (ref 30.0–36.0)
MCV: 102.7 fL — ABNORMAL HIGH (ref 80.0–100.0)
Monocytes Absolute: 0.2 K/uL (ref 0.1–1.0)
Monocytes Relative: 9 %
Neutro Abs: 1.8 K/uL (ref 1.7–7.7)
Neutrophils Relative %: 76 %
Platelet Count: 104 K/uL — ABNORMAL LOW (ref 150–400)
RBC: 2.25 MIL/uL — ABNORMAL LOW (ref 3.87–5.11)
RDW: 17.9 % — ABNORMAL HIGH (ref 11.5–15.5)
WBC Count: 2.4 K/uL — ABNORMAL LOW (ref 4.0–10.5)
nRBC: 0.8 % — ABNORMAL HIGH (ref 0.0–0.2)

## 2024-03-18 LAB — CMP (CANCER CENTER ONLY)
ALT: 21 U/L (ref 0–44)
AST: 48 U/L — ABNORMAL HIGH (ref 15–41)
Albumin: 3.3 g/dL — ABNORMAL LOW (ref 3.5–5.0)
Alkaline Phosphatase: 213 U/L — ABNORMAL HIGH (ref 38–126)
Anion gap: 5 (ref 5–15)
BUN: 15 mg/dL (ref 8–23)
CO2: 29 mmol/L (ref 22–32)
Calcium: 8.4 mg/dL — ABNORMAL LOW (ref 8.9–10.3)
Chloride: 106 mmol/L (ref 98–111)
Creatinine: 0.9 mg/dL (ref 0.44–1.00)
GFR, Estimated: >60 mL/min (ref >60)
Glucose, Bld: 169 mg/dL — ABNORMAL HIGH (ref 70–99)
Potassium: 4 mmol/L (ref 3.5–5.1)
Sodium: 140 mmol/L (ref 135–145)
Total Bilirubin: 0.4 mg/dL (ref 0.0–1.2)
Total Protein: 5.8 g/dL — ABNORMAL LOW (ref 6.5–8.1)

## 2024-03-18 LAB — PREPARE RBC (CROSSMATCH)

## 2024-03-18 LAB — MAGNESIUM: Magnesium: 1.9 mg/dL (ref 1.7–2.4)

## 2024-03-18 LAB — SAMPLE TO BLOOD BANK

## 2024-03-18 MED ORDER — SODIUM CHLORIDE 0.9% FLUSH: 10.0000 mL | Freq: Once | INTRAVENOUS | Status: DC | PRN

## 2024-03-18 MED ORDER — ABEMACICLIB 100 MG PO TABS
100.0000 mg | ORAL_TABLET | Freq: Two times a day (BID) | ORAL | 0 refills | Status: DC
  Filled 2024-03-18: qty 56, 28d supply, fill #0

## 2024-03-18 NOTE — Progress Notes (Signed)
 Specialty Pharmacy Refill Coordination Note  Doris Smith is a 73 y.o. female contacted today regarding refills of specialty medication(s) Abemaciclib  (VERZENIO )   Patient requested [name] at [facility] at [place] date: 03/19/24   Medication will be filled on 03/19/24.

## 2024-03-18 NOTE — Progress Notes (Signed)
 Palliative Medicine [facility]  Telephone:(336) [phone] [phone]) [phone]   Name: Sanja Elizardo Date: 03/18/2024 MRN: [id number]  DOB: [birth year]  Patient Care Team: [name], FNP as PCP - General (Family Medicine) Jerilynn Lamarr HERO, NP as PCP - Cardiology (Nurse Practitioner) Ethyl Lenis, MD as Consulting Physician (General Surgery) Dewey Rush, MD as Consulting Physician (Radiation Oncology) Laymon Soulier, MD as Consulting Physician (Pulmonary Disease) Kay Kemps, MD as Consulting Physician (Orthopedic Surgery) Duwayne Purchase, MD as Consulting Physician (Orthopedic Surgery) Kallie Medici, NP as Nurse Practitioner (Adult Health Nurse Practitioner) Glean Stephane BROCKS, RN (Inactive) as Oncology Nurse Navigator Tyree Nanetta SAILOR, RN as Oncology Nurse Navigator Arelia Filippo, MD as Consulting Physician (Plastic Surgery) Lavona Agent, MD as Consulting Physician (Cardiology) Loretha Ash, MD as Medical Oncologist (Hematology and Oncology) Pickenpack-Cousar, Fannie SAILOR, NP as Nurse Practitioner (Hospice and Palliative Medicine)    INTERVAL HISTORY: Mallori Araque is a 73 y.o. female with oncologic medical history including estrogen receptor positive breast cancer(07/2018) s/p right mastectomy. PET scan 10/06/22 showing widespread osseous metastasis. Other pertinent history includes atrial fibrillation, asthma, OSA, GERD, fatty liver, fibromyalgia, osteoporosis, chronic headaches, anxiety, depression, and bipolar disorder. Underwent ORIF of right hip 09/20/2022. Palliative ask to see for symptom and pain management and goals of care.   SOCIAL HISTORY:     reports that she has never smoked. She has never used smokeless tobacco. She reports that she does not drink alcohol and does not use drugs.  ADVANCE DIRECTIVES:  On file  CODE STATUS: Full code  PAST MEDICAL HISTORY: Past Medical History:  Diagnosis Date   A-fib (HCC) 11/2012    PAF   Anxiety    takes Ativan    Asthma 04/07/2011   dx   Bipolar disorder (HCC)    Cancer (HCC)    Right breast   Depression    Early cataracts, bilateral    Fatty liver    Fibromyalgia    GERD (gastroesophageal reflux disease)    Irritable bowel syndrome    Mental disorder    dx bipolar   PONV (postoperative nausea and vomiting)    Sleep apnea 04/2011   does not wear CPAP   Tardive dyskinesia     ALLERGIES:  is allergic to iohexol, codeine, palonosetron , prednisone , sulfonamide derivatives, celecoxib, sulfa antibiotics, and vitamin b12.  MEDICATIONS:  Current Outpatient Medications  Medication Sig Dispense Refill   abemaciclib  (VERZENIO ) 100 MG tablet Take 1 tablet (100 mg total) by mouth 2 (two) times daily. 56 tablet 0   ciprofloxacin  (CIPRO ) 500 MG tablet Take 500 mg by mouth 2 (two) times daily.     cyanocobalamin  1000 MCG tablet Take 1 tablet (1,000 mcg total) by mouth daily. 60 tablet 0   folic acid  (FOLVITE ) 1 MG tablet Take 1 tablet (1 mg total) by mouth daily. (Patient not taking: Reported on 02/28/2024) 60 tablet 0   furosemide  (LASIX ) 20 MG tablet Take 1 tablet (20 mg total) by mouth daily with breakfast for 7 days. (Patient not taking: Reported on 02/28/2024) 7 tablet 0   Incontinence Supply Disposable (CERTAINTY FITTED BRIEFS XL) MISC 1 Package by Does not apply route as needed. 56 each 3   lidocaine  (LIDODERM ) 5 % Place 2 patches onto the skin daily. Remove & Discard patch within 12 hours or as directed by MD 60 patch 2   lidocaine -prilocaine  (EMLA ) cream Apply 1 Application topically as needed. Apply to port site 30-45 min prior to appointment. 30 g 3  LORazepam  (ATIVAN ) 1 MG tablet Take 1 tablet (1 mg total) by mouth 2 (two) times daily as needed for anxiety. 60 tablet 5   OLANZapine  (ZYPREXA ) 5 MG tablet TAKE 1 TABLET BY MOUTH AT BEDTIME 30 tablet 0   ondansetron  (ZOFRAN ) 8 MG tablet Take 1 tablet (8 mg total) by mouth every 8 (eight) hours as needed for  nausea or vomiting. 30 tablet 5   oxyCODONE  (OXYCONTIN ) 15 mg 12 hr tablet Take 1 tablet (15 mg total) by mouth every 12 (twelve) hours. 60 tablet 0   oxyCODONE -acetaminophen  (PERCOCET) 10-325 MG tablet Take 1 tablet by mouth every 4 (four) hours as needed for pain. 60 tablet 0   oxyCODONE -acetaminophen  (PERCOCET) 7.5-325 MG tablet Take 1 tablet by mouth every 8 (eight) hours as needed.     pantoprazole  (PROTONIX ) 40 MG tablet Take 1 tablet (40 mg total) by mouth 2 (two) times daily before a meal. 180 tablet 1   phenazopyridine  (PYRIDIUM ) 200 MG tablet Take 1 tablet (200 mg total) by mouth 3 (three) times daily as needed for pain. (Patient not taking: Reported on 02/28/2024) 10 tablet 0   polyethylene glycol (MIRALAX / GLYCOLAX) 17 g packet Take 17 g by mouth daily.     pramipexole  (MIRAPEX ) 1 MG tablet TAKE 1 TABLET BY MOUTH THREE TIMES DAILY 90 tablet 0   prochlorperazine  (COMPAZINE ) 10 MG tablet Take 1 tablet (10 mg total) by mouth every 6 (six) hours as needed for nausea or vomiting. 30 tablet 2   No current facility-administered medications for this visit.    VITAL SIGNS: BP 112/63 (BP Location: Left Arm, Patient Position: Sitting)   Pulse 75   Temp 98.4 F (36.9 C) (Temporal)   Resp 17   Ht 5' 4 (1.626 m)   Wt 152 lb (68.9 kg)   SpO2 100%   BMI 26.09 kg/m  Filed Weights   03/18/24 1327  Weight: 152 lb (68.9 kg)     Estimated body mass index is 26.09 kg/m as calculated from the following:   Height as of this encounter: 5' 4 (1.626 m).   Weight as of this encounter: 152 lb (68.9 kg).   PERFORMANCE STATUS (ECOG) : 1 - Symptomatic but completely ambulatory  Physical Exam General: Fatigue Cardiovascular: regular rate and rhythm Pulmonary: diminished, wheezing Extremities: no edema, no joint deformities Skin: no rashes Neurological: AAO x3  IMPRESSION: Discussed the use of AI scribe software for clinical note transcription with the patient, who gave verbal consent to  proceed.  History of Present Illness Kharizma Lesnick is a 73 year old female with cancer who presents for follow-up. She is accompanied by her husband. Patient in wheelchair. Her appetite fluctuates. Her weight is stable at 152lbs. She is complaining of increased shortness of breath and fatigue.   She has been experiencing a persistent dry cough and some congestion, with no production of mucus over the past week. Increased feelings of shortness of breath. No other symptoms noted. She recently test for COVID which was negative.   She feels tired and weak, which may be related to her low hemoglobin level of 7.5 g/dL, as noted in recent lab work.  She has a history of pleural effusions, previously on the right side, but recent imaging shows moderate fluid on the left side and a small amount on the right. Patient aware she will most likely require thoracentesis.   She is managing her pain with oxycodone  every twelve hours and additional oxycodone  as needed.  She uses oxygen as needed however is wearing more consistently due to symptoms.  She reports a knot in the middle of her back that throbs and radiates around her back.  She is experiencing constipation, which she is managing with Miralax. She plans to adjust the dosage depending on her schedule to avoid bathroom urgency during appointments.  All questions answered and support provided.   Goals of Care Mrs. Driscoll is emotional expressing her willingness to live. She states clearly her goal is to continue to treat the treatable allow her every opportunity to continue to thrive.  She speaks to her wishes of returning back to her home which is currently under renovations.  Patient states she is excepting a passing away when God calls her home however she is hopeful that this time is not near.  Emotional support provided.  Patient and family continue to take things one day at a time.  11/30/22: Mrs. Gharibian is emotional. Her husband becomes  emotional as patient shares she knows her health is not the best and her cancer is not curable. She is taking life one day at time. Speaks to when her time comes she will be ready but hopeful it is not now. She knows that no one has a definitive time frame. Expresses her appreciation in all of the care and support. Expresses her goal is to continue to treat the treatable allowing her the ability to continue to thrive while not suffering.    4/30: Since the night of 10/30/22, Mrs. Byrns has continued to meet goal of lying in her own bed at night beside her husband. She expresses appreciation for the care of everyone at the cancer center and is hopeful for continued improvement.  I discussed the importance of continued conversation with family and their medical providers regarding overall plan of care and treatment options, ensuring decisions are within the context of the patients values and GOCs. Assessment & Plan Anemia requiring blood transfusion Hemoglobin level is 7.5, indicating significant anemia contributing to fatigue and weakness. - Schedule blood transfusion for 9:30 AM tomorrow. - Ensure she keeps blue bracelet on for transfusion.  Malignant pleural effusion, left greater than right, requiring thoracentesis Moderate pleural effusion on the left side and a small amount on the right side, as seen on chest x-ray. This is a change from previous effusions, which were primarily on the right side. - Coordinate with interventional radiology for thoracentesis, ideally after the blood transfusion.  Malignant neoplasm of bone with chronic neoplasm-related pain Chronic pain related to malignant neoplasm of bone, with a notable knot in the middle of the back causing throbbing and radiating pain. Current pain management includes oxycodone  and oxycontin , with adjustments made to optimize relief. - Continue oxycodone  and oxycontin  for pain management. - Evaluate the need for a back brace with Dr. Loretha,  considering the risk of fractures due to bone cancer. - Check on medication refill, with four days of medication remaining.  Constipation Constipation. - Continue Miralax as needed, considering timing with scheduled procedures.  Dry cough and nasal congestion Persistent dry cough and nasal congestion, with no productive sputum. No signs of infection based on normal white blood cell count.  I will plan to see patient back in 2-3 weeks. Sooner if needed.   Patient expressed understanding and was in agreement with this plan. She also understands that She can call the clinic at any time with any questions, concerns, or complaints.   Any controlled substances utilized were prescribed in the  context of palliative care. PDMP has been reviewed.   Visit consisted of counseling and education dealing with the complex and emotionally intense issues of symptom management and palliative care in the setting of serious and potentially life-threatening illness.  Levon Borer, AGPCNP-BC  Palliative Medicine Team/[facility]   bloo

## 2024-03-18 NOTE — Progress Notes (Signed)
 Blood orders entered per NP

## 2024-03-19 ENCOUNTER — Inpatient Hospital Stay

## 2024-03-19 VITALS — BP 101/52 | HR 66 | Temp 98.4°F | Resp 16 | Wt 154.4 lb

## 2024-03-19 DIAGNOSIS — Z170 Estrogen receptor positive status [ER+]: Secondary | ICD-10-CM

## 2024-03-19 DIAGNOSIS — Z5111 Encounter for antineoplastic chemotherapy: Secondary | ICD-10-CM | POA: Diagnosis not present

## 2024-03-19 DIAGNOSIS — Z7983 Long term (current) use of bisphosphonates: Secondary | ICD-10-CM | POA: Diagnosis not present

## 2024-03-19 LAB — CANCER ANTIGEN 27.29: CA 27.29: 1210 U/mL — ABNORMAL HIGH (ref 0.0–38.6)

## 2024-03-19 MED ORDER — ACETAMINOPHEN 325 MG PO TABS
650.0000 mg | ORAL_TABLET | Freq: Once | ORAL | Status: AC
  Administered 2024-03-19: 650 mg via ORAL
  Filled 2024-03-19: qty 2

## 2024-03-19 MED ORDER — DIPHENHYDRAMINE HCL 25 MG PO CAPS
25.0000 mg | ORAL_CAPSULE | Freq: Once | ORAL | Status: AC
  Administered 2024-03-19: 25 mg via ORAL
  Filled 2024-03-19: qty 1

## 2024-03-19 MED ORDER — SODIUM CHLORIDE 0.9% IV SOLUTION
250.0000 mL | INTRAVENOUS | Status: DC
  Administered 2024-03-19: 100 mL via INTRAVENOUS

## 2024-03-19 MED ORDER — SODIUM CHLORIDE 0.9 % IV SOLN: INTRAVENOUS | Status: DC

## 2024-03-19 NOTE — Patient Instructions (Signed)

## 2024-03-20 ENCOUNTER — Ambulatory Visit (HOSPITAL_COMMUNITY)
Admission: RE | Admit: 2024-03-20 | Discharge: 2024-03-20 | Disposition: A | Discharge status: Home / Self Care | Source: Ambulatory Visit | Attending: Radiology | Admitting: Radiology

## 2024-03-20 ENCOUNTER — Ambulatory Visit (HOSPITAL_COMMUNITY)
Admission: RE | Admit: 2024-03-20 | Discharge: 2024-03-20 | Disposition: A | Discharge status: Home / Self Care | Source: Ambulatory Visit | Attending: Nurse Practitioner | Admitting: Nurse Practitioner

## 2024-03-20 DIAGNOSIS — J90 Pleural effusion, not elsewhere classified: Secondary | ICD-10-CM | POA: Insufficient documentation

## 2024-03-20 DIAGNOSIS — J910 Malignant pleural effusion: Secondary | ICD-10-CM

## 2024-03-20 DIAGNOSIS — Z853 Personal history of malignant neoplasm of breast: Secondary | ICD-10-CM | POA: Insufficient documentation

## 2024-03-20 DIAGNOSIS — R918 Other nonspecific abnormal finding of lung field: Secondary | ICD-10-CM | POA: Diagnosis not present

## 2024-03-20 DIAGNOSIS — Z48813 Encounter for surgical aftercare following surgery on the respiratory system: Secondary | ICD-10-CM | POA: Diagnosis not present

## 2024-03-20 DIAGNOSIS — Z170 Estrogen receptor positive status [ER+]: Secondary | ICD-10-CM

## 2024-03-20 DIAGNOSIS — C801 Malignant (primary) neoplasm, unspecified: Secondary | ICD-10-CM | POA: Diagnosis not present

## 2024-03-20 DIAGNOSIS — C50919 Malignant neoplasm of unspecified site of unspecified female breast: Secondary | ICD-10-CM | POA: Diagnosis not present

## 2024-03-20 DIAGNOSIS — R0602 Shortness of breath: Secondary | ICD-10-CM

## 2024-03-20 DIAGNOSIS — C7951 Secondary malignant neoplasm of bone: Secondary | ICD-10-CM | POA: Diagnosis not present

## 2024-03-20 HISTORY — PX: IR THORACENTESIS ASP PLEURAL SPACE W/IMG GUIDE: IMG5380

## 2024-03-20 LAB — TYPE AND SCREEN
ABO/RH(D): O POS
Antibody Screen: NEGATIVE
Unit division: 0

## 2024-03-20 LAB — BPAM RBC
Blood Product Expiration Date: 202510072359
ISSUE DATE / TIME: 202509100958
Unit Type and Rh: 202510072359
Unit Type and Rh: 5100

## 2024-03-20 MED ORDER — LIDOCAINE-EPINEPHRINE 1 %-1:100000 IJ SOLN
INTRAMUSCULAR | Status: AC
  Filled 2024-03-20: qty 1

## 2024-03-20 MED ORDER — LIDOCAINE-EPINEPHRINE (PF) 2 %-1:200000 IJ SOLN: 20.0000 mL | Freq: Once | INTRAMUSCULAR | Status: DC

## 2024-03-20 NOTE — Procedures (Signed)
 Ultrasound-guided therapeutic right thoracentesis performed yielding 475 cc of yellow  fluid. No immediate complications. Follow-up chest x-ray pending. EBL none.

## 2024-03-25 ENCOUNTER — Encounter

## 2024-03-27 ENCOUNTER — Encounter: Payer: Self-pay | Admitting: Nurse Practitioner

## 2024-03-27 ENCOUNTER — Other Ambulatory Visit: Payer: Self-pay | Admitting: Nurse Practitioner

## 2024-03-27 ENCOUNTER — Other Ambulatory Visit (HOSPITAL_COMMUNITY): Payer: Self-pay

## 2024-03-27 ENCOUNTER — Inpatient Hospital Stay (HOSPITAL_BASED_OUTPATIENT_CLINIC_OR_DEPARTMENT_OTHER): Admitting: Hematology and Oncology

## 2024-03-27 ENCOUNTER — Inpatient Hospital Stay (HOSPITAL_BASED_OUTPATIENT_CLINIC_OR_DEPARTMENT_OTHER): Admitting: Nurse Practitioner

## 2024-03-27 ENCOUNTER — Other Ambulatory Visit: Payer: Self-pay

## 2024-03-27 VITALS — BP 122/46 | HR 82 | Temp 98.6°F | Resp 16 | Wt 149.0 lb

## 2024-03-27 DIAGNOSIS — G893 Neoplasm related pain (acute) (chronic): Secondary | ICD-10-CM | POA: Diagnosis not present

## 2024-03-27 DIAGNOSIS — Z9011 Acquired absence of right breast and nipple: Secondary | ICD-10-CM | POA: Diagnosis not present

## 2024-03-27 DIAGNOSIS — R530 Neoplastic (malignant) related fatigue: Secondary | ICD-10-CM

## 2024-03-27 DIAGNOSIS — Z515 Encounter for palliative care: Secondary | ICD-10-CM

## 2024-03-27 DIAGNOSIS — Z5111 Encounter for antineoplastic chemotherapy: Secondary | ICD-10-CM | POA: Diagnosis not present

## 2024-03-27 DIAGNOSIS — Z170 Estrogen receptor positive status [ER+]: Secondary | ICD-10-CM

## 2024-03-27 DIAGNOSIS — M810 Age-related osteoporosis without current pathological fracture: Secondary | ICD-10-CM | POA: Diagnosis not present

## 2024-03-27 DIAGNOSIS — C50411 Malignant neoplasm of upper-outer quadrant of right female breast: Secondary | ICD-10-CM

## 2024-03-27 DIAGNOSIS — R0602 Shortness of breath: Secondary | ICD-10-CM | POA: Diagnosis not present

## 2024-03-27 DIAGNOSIS — C7800 Secondary malignant neoplasm of unspecified lung: Secondary | ICD-10-CM | POA: Diagnosis not present

## 2024-03-27 DIAGNOSIS — Z79899 Other long term (current) drug therapy: Secondary | ICD-10-CM | POA: Diagnosis not present

## 2024-03-27 DIAGNOSIS — R634 Abnormal weight loss: Secondary | ICD-10-CM

## 2024-03-27 DIAGNOSIS — C7951 Secondary malignant neoplasm of bone: Secondary | ICD-10-CM | POA: Diagnosis not present

## 2024-03-27 DIAGNOSIS — Z1721 Progesterone receptor positive status: Secondary | ICD-10-CM | POA: Diagnosis not present

## 2024-03-27 DIAGNOSIS — D649 Anemia, unspecified: Secondary | ICD-10-CM | POA: Diagnosis not present

## 2024-03-27 DIAGNOSIS — Z1732 Human epidermal growth factor receptor 2 negative status: Secondary | ICD-10-CM | POA: Diagnosis not present

## 2024-03-27 DIAGNOSIS — Z7983 Long term (current) use of bisphosphonates: Secondary | ICD-10-CM | POA: Diagnosis not present

## 2024-03-27 DIAGNOSIS — Z800 Family history of malignant neoplasm of digestive organs: Secondary | ICD-10-CM | POA: Diagnosis not present

## 2024-03-27 DIAGNOSIS — D696 Thrombocytopenia, unspecified: Secondary | ICD-10-CM | POA: Diagnosis not present

## 2024-03-27 DIAGNOSIS — Z803 Family history of malignant neoplasm of breast: Secondary | ICD-10-CM | POA: Diagnosis not present

## 2024-03-27 MED ORDER — OXYCODONE HCL ER 15 MG PO T12A
15.0000 mg | EXTENDED_RELEASE_TABLET | Freq: Two times a day (BID) | ORAL | 0 refills | Status: DC
  Filled 2024-03-27: qty 60, 30d supply, fill #0

## 2024-03-27 NOTE — Progress Notes (Signed)
 Palliative Medicine [facility]  Telephone:(336) [phone] Fax:(336) [phone]   Name: Doris Smith Date: 03/27/2024 MRN: [id number]  DOB: 1950-10-10  Patient Care Team: [name], FNP as PCP - General (Family Medicine) Jerilynn Lamarr HERO, NP as PCP - Cardiology (Nurse Practitioner) Ethyl Lenis, MD as Consulting Physician (General Surgery) Dewey Rush, MD as Consulting Physician (Radiation Oncology) Laymon Soulier, MD as Consulting Physician (Pulmonary Disease) Kay Kemps, MD as Consulting Physician (Orthopedic Surgery) Duwayne Purchase, MD as Consulting Physician (Orthopedic Surgery) Kallie Medici, NP as Nurse Practitioner (Adult Health Nurse Practitioner) Tyree Nanetta SAILOR, RN as Oncology Nurse Navigator Arelia Filippo, MD as Consulting Physician (Plastic Surgery) Lavona Agent, MD as Consulting Physician (Cardiology) Loretha Ash, MD as Medical Oncologist (Hematology and Oncology) Pickenpack-Cousar, Fannie SAILOR, NP as Nurse Practitioner (Hospice and Palliative Medicine)    INTERVAL HISTORY: Doris Smith is a 73 y.o. female with oncologic medical history including estrogen receptor positive breast cancer(07/2018) s/p right mastectomy. PET scan 10/06/22 showing widespread osseous metastasis. Other pertinent history includes atrial fibrillation, asthma, OSA, GERD, fatty liver, fibromyalgia, osteoporosis, chronic headaches, anxiety, depression, and bipolar disorder. Underwent ORIF of right hip 09/20/2022. Palliative ask to see for symptom and pain management and goals of care.   SOCIAL HISTORY:     reports that she has never smoked. She has never used smokeless tobacco. She reports that she does not drink alcohol and does not use drugs.  ADVANCE DIRECTIVES:  On file  CODE STATUS: Full code  PAST MEDICAL HISTORY: Past Medical History:  Diagnosis Date   A-fib (HCC) 11/2012   PAF   Anxiety    takes Ativan    Asthma 04/07/2011    dx   Bipolar disorder (HCC)    Cancer (HCC)    Right breast   Depression    Early cataracts, bilateral    Fatty liver    Fibromyalgia    GERD (gastroesophageal reflux disease)    Irritable bowel syndrome    Mental disorder    dx bipolar   PONV (postoperative nausea and vomiting)    Sleep apnea 04/2011   does not wear CPAP   Tardive dyskinesia     ALLERGIES:  is allergic to iohexol, codeine, palonosetron , prednisone , sulfonamide derivatives, celecoxib, sulfa antibiotics, and vitamin b12.  MEDICATIONS:  Current Outpatient Medications  Medication Sig Dispense Refill   abemaciclib  (VERZENIO ) 100 MG tablet Take 1 tablet (100 mg total) by mouth 2 (two) times daily. 56 tablet 0   ciprofloxacin  (CIPRO ) 500 MG tablet Take 500 mg by mouth 2 (two) times daily.     cyanocobalamin  1000 MCG tablet Take 1 tablet (1,000 mcg total) by mouth daily. 60 tablet 0   folic acid  (FOLVITE ) 1 MG tablet Take 1 tablet (1 mg total) by mouth daily. (Patient not taking: Reported on 03/27/2024) 60 tablet 0   furosemide  (LASIX ) 20 MG tablet Take 1 tablet (20 mg total) by mouth daily with breakfast for 7 days. (Patient not taking: Reported on 03/27/2024) 7 tablet 0   Incontinence Supply Disposable (CERTAINTY FITTED BRIEFS XL) MISC 1 Package by Does not apply route as needed. 56 each 3   lidocaine  (LIDODERM ) 5 % Place 2 patches onto the skin daily. Remove & Discard patch within 12 hours or as directed by MD 60 patch 2   lidocaine -prilocaine  (EMLA ) cream Apply 1 Application topically as needed. Apply to port site 30-45 min prior to appointment. 30 g 3   LORazepam  (ATIVAN ) 1 MG tablet Take 1  tablet (1 mg total) by mouth 2 (two) times daily as needed for anxiety. 60 tablet 5   OLANZapine  (ZYPREXA ) 5 MG tablet TAKE 1 TABLET BY MOUTH AT BEDTIME 30 tablet 0   ondansetron  (ZOFRAN ) 8 MG tablet Take 1 tablet (8 mg total) by mouth every 8 (eight) hours as needed for nausea or vomiting. 30 tablet 5   oxyCODONE  (OXYCONTIN ) 15 mg  12 hr tablet Take 1 tablet (15 mg total) by mouth every 12 (twelve) hours. 60 tablet 0   oxyCODONE -acetaminophen  (PERCOCET) 10-325 MG tablet Take 1 tablet by mouth every 4 (four) hours as needed for pain. 60 tablet 0   oxyCODONE -acetaminophen  (PERCOCET) 7.5-325 MG tablet Take 1 tablet by mouth every 8 (eight) hours as needed.     pantoprazole  (PROTONIX ) 40 MG tablet Take 1 tablet (40 mg total) by mouth 2 (two) times daily before a meal. 180 tablet 1   phenazopyridine  (PYRIDIUM ) 200 MG tablet Take 1 tablet (200 mg total) by mouth 3 (three) times daily as needed for pain. (Patient not taking: Reported on 03/27/2024) 10 tablet 0   polyethylene glycol (MIRALAX / GLYCOLAX) 17 g packet Take 17 g by mouth daily.     pramipexole  (MIRAPEX ) 1 MG tablet TAKE 1 TABLET BY MOUTH THREE TIMES DAILY 90 tablet 0   prochlorperazine  (COMPAZINE ) 10 MG tablet Take 1 tablet (10 mg total) by mouth every 6 (six) hours as needed for nausea or vomiting. 30 tablet 2   No current facility-administered medications for this visit.    VITAL SIGNS: There were no vitals taken for this visit. There were no vitals filed for this visit.    Estimated body mass index is 25.58 kg/m as calculated from the following:   Height as of 03/18/24: 5' 4 (1.626 m).   Weight as of an earlier encounter on 03/27/24: 149 lb (67.6 kg).   PERFORMANCE STATUS (ECOG) : 1 - Symptomatic but completely ambulatory  Physical Exam General: Fatigue Cardiovascular: regular rate and rhythm Pulmonary: diminished, wheezing Extremities: no edema, no joint deformities Skin: no rashes Neurological: AAO x3  IMPRESSION: Discussed the use of AI scribe software for clinical note transcription with the patient, who gave verbal consent to proceed.  History of Present Illness [name] is a 73 year old female with cancer who presents for follow-up. She is accompanied by her husband and sister. Patient in wheelchair. Her appetite fluctuates. She  is concerned about a slight drop in her weight which is down to 149lbs. She reports her appetite is generally good. Patient using her portable oxygen as she felt somewhat short of breath. Tolerating home use of oxygen.   [name] reports feeling much better compared to previous visit. She required blood transfusion at last visit and thoracentesis on 9/11 yielding . States her cough and dyspnea improved post procedure.   She is managing her pain with oxycodone  every twelve hours and additional oxycodone  as needed. Feels current regimen is effective. No changes at this time.   She is experiencing constipation, which she is managing with Miralax. She plans to adjust the dosage depending on her schedule to avoid bathroom urgency during appointments.  All questions answered and support provided.   Goals of Care Mrs. Vandeven is emotional expressing her willingness to live. She states clearly her goal is to continue to treat the treatable allow her every opportunity to continue to thrive.  She speaks to her wishes of returning back to her home which is currently under renovations.  Patient states she is excepting a passing away when God calls her home however she is hopeful that this time is not near.  Emotional support provided.  Patient and family continue to take things one day at a time.  11/30/22: Mrs. Reppert is emotional. Her husband becomes emotional as patient shares she knows her health is not the best and her cancer is not curable. She is taking life one day at time. Speaks to when her time comes she will be ready but hopeful it is not now. She knows that no one has a definitive time frame. Expresses her appreciation in all of the care and support. Expresses her goal is to continue to treat the treatable allowing her the ability to continue to thrive while not suffering.    4/30: Since the night of 10/30/22, Mrs. Hentges has continued to meet goal of lying in her own bed at night beside her husband. She  expresses appreciation for the care of everyone at the cancer center and is hopeful for continued improvement.  I discussed the importance of continued conversation with family and their medical providers regarding overall plan of care and treatment options, ensuring decisions are within the context of the patients values and GOCs. Assessment & Plan  Malignant neoplasm of bone with chronic neoplasm-related pain Chronic pain related to malignant neoplasm of bone, with a notable knot in the middle of the back causing throbbing and radiating pain. Current pain management includes oxycodone  and oxycontin , with adjustments made to optimize relief. - Continue oxycodone  and oxycontin  for pain management. - Refill medications as requested  Constipation Constipation. - Continue Miralax as needed, considering timing with scheduled procedures.  I will plan to see patient back in 2-3 weeks. Sooner if needed.   Patient expressed understanding and was in agreement with this plan. She also understands that She can call the clinic at any time with any questions, concerns, or complaints.   Any controlled substances utilized were prescribed in the context of palliative care. PDMP has been reviewed.   Visit consisted of counseling and education dealing with the complex and emotionally intense issues of symptom management and palliative care in the setting of serious and potentially life-threatening illness.  Levon Borer, AGPCNP-BC  Palliative Medicine Team/[facility]   bloo

## 2024-03-27 NOTE — Progress Notes (Signed)
 [facility] Cancer Follow up:    Doris Mustard, FNP [address] [place] [address]   DIAGNOSIS:  Cancer Staging  Malignant neoplasm of upper-outer quadrant of right breast in female, estrogen receptor positive (HCC) Staging form: Breast, AJCC 8th Edition - Pathologic: Stage IB (pT3, pN34mi, cM0, G2, ER+, PR+, HER2-) - Signed by Crawford Morna Pickle, NP on 10/02/2018 Multigene prognostic tests performed: MammaPrint Histologic grading system: 3 grade system - Clinical stage from 12/28/2022: Stage IV (rcT2, rcN0, rcM1, G3, ER+, PR+, HER2-) - Signed by Loretha Ash, MD on 12/28/2022 Stage prefix: Recurrence Histologic grading system: 3 grade system Laterality: Right Staged by: Pathologist and managing physician Stage used in treatment planning: Yes National guidelines used in treatment planning: Yes Type of national guideline used in treatment planning: NCCN   SUMMARY OF ONCOLOGIC HISTORY: Oncology History  Malignant neoplasm of upper-outer quadrant of right breast in female, estrogen receptor positive (HCC)  08/08/2018 Initial Diagnosis   status post right breast biopsy 08/02/2018 for a clinical T3 N0, stage IIA invasive lobular carcinoma, grade 2, estrogen receptor strongly positive, progesterone receptor 1% positive, with no HER-2 amplification and an MIB-1 of 1%.             (a) CT scan of the head and chest, without contrast 08/23/2018 showed nonspecific 0.4 cm left lower lobe pulmonary nodule, no definitive metastatic disease             (b) bone scan 08/23/2018 shows multiple spinal areas of abnormal uptake, but             (c) total spinal MRI 09/03/2018 finds bone scan findings to be secondary to degenerative disease, no evidence of metastatic disease.   08/2018 - 10/2022 Anti-estrogen oral therapy   Anastrozole ; discontinued 10/14/2018 in preparation for chemotherapy, resumed October 2020   09/16/2018 Surgery   Right mastectomy Jeoffrey)  [phone]): Invasive Lobular Carcinoma, 10.5 cm, grade 2, negative margins. 1 of 5 lymph nodes positive for carcinoma.   09/16/2018 Miscellaneous   MammaPrint high risk suggests a 5-year metastasis free survival of 93% with chemotherapy, with an absolute chemotherapy benefit in the greater than 12% range    09/16/2018 Miscellaneous   Caris testing on mastectomy sample (09/16/2018) showed stable MSI and proficient mismatch repair status, with a low mutational burden; BRCA 1 and 2 were not mutated, PI K3 was not mutated, ER B B2 was not mutated, and AKT 1 was not mutated. 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 Administration: 0.25 mg (11/05/2018), 0.25 mg (11/26/2018), 0.25 mg (12/17/2018), 0.25 mg (01/07/2019), 0.25 mg (01/28/2019), 0.25 mg (02/18/2019), 0.25 mg (03/11/2019), 0.25 mg (04/02/2019)  methotrexate  (PF) chemo injection 84 mg, 39.8 mg/m2 = 84.5 mg, Intravenous,  Once, 5 of 5 cycles. Administration: 84 mg (11/05/2018), 84 mg (11/26/2018), 84 mg (12/17/2018), 84 mg (03/11/2019), 84 mg (04/02/2019)  pegfilgrastim -cbqv (UDENYCA ) injection 6 mg, 6 mg, Subcutaneous, Once, 6 of 6 cycles. Administration: 6 mg (12/19/2018)  cyclophosphamide  (CYTOXAN ) 1,260 mg in sodium chloride  0.9 % 250 mL chemo infusion, 600 mg/m2 = 1,260 mg, Intravenous,  Once, 8 of 8 cycle. Administration: 1,260 mg (11/05/2018), 1,260 mg (11/26/2018), 1,260 mg (12/17/2018), 1,260 mg (01/07/2019), 1,260 mg (01/28/2019), 1,260 mg (02/18/2019), 1,260 mg (03/11/2019), 1,260 mg (04/02/2019)  fluorouracil  (ADRUCIL ) chemo injection 1,250  mg, 600 mg/m2 = 1,250 mg, Intravenous,  Once, 8 of 8 cycles. Administration: 1,250 mg (11/05/2018), 1,250 mg (11/26/2018), 1,250 mg (12/17/2018), 1,250 mg (01/07/2019), 1,250 mg (01/28/2019), 1,250 mg (02/18/2019), 1,250 mg (03/11/2019), 1,250 mg  (04/02/2019).    12/31/2018 - 02/19/2019 Radiation Therapy   The patient initially received a dose of 50.4 Gy in 28 fractions to the chest wall and supraclavicular region. This was delivered using a 3-D conformal, 4 field technique. The patient then received a boost to the mastectomy scar. This delivered an additional 10 Gy in 5 fractions using an en face electron field. The total dose was 60.4 Gy.   09/12/2022 Imaging   Bone scan on 09/12/2022 shows uptake in ribs, manubrium. Widespread osseous metastasis confirmed on PET scan that was completed on October 06, 2022. Right iliac crest biopsy demonstrated metastatic carcinoma consistent with patient's known breast carcinoma. ER 90% positive, PR 90% positive, Ki67 10%, HER2 negative.    10/10/2022 Treatment Plan Change   Faslodex  beginning 10/10/2022; Ibrance  beginning 12/20/2022; Zometa  every 12 weeks 11/07/2022    10/23/2022 - 11/03/2022 Radiation Therapy   Palliative Radiation 10/23/2022-11/03/2022:  left chest wall, lower T spine, and right proximal hip/pelvis were each treated to 30 GY in 10 fractions.      CURRENT THERAPY: Fulvestrant , Zometa , Verzenio   INTERVAL HISTORY:  Discussed the use of AI scribe software for clinical note transcription with the patient, who gave verbal consent to proceed.  History of Present Illness  Doris Smith is a 73 year old female with metastatic breast cancer who presents for follow-up regarding her ongoing treatment and symptom management.  She has experienced improvement in her resting condition since starting oxygen therapy at night. Oxygen is used during the day only when absolutely necessary, such as this morning, but she was able to remove it shortly after.  She experiences manageable nausea, with a severe episode of vomiting occurring a couple of weeks ago that eventually resolved. Occasional lightheadedness is noted, though not present on the day of the visit.  She is taking all her medications,  including oxycodone , which is in the process of being refilled. She also takes Verzenio  alternating between 100 mg and 50 mg doses daily. She received her Faslodex  injection last week.  A blood transfusion was performed last week after feeling significantly unwell, and she reports feeling somewhat better since then. Recent lab work from last week included a hemoglobin level of 7.5, a white blood cell count, and a platelet count that was reported to the patient. Fluid was removed from her right lung last week, providing some relief. The last fluid removal prior to this was about a month ago.  Ongoing weight loss is a persistent issue, with some fluctuation attributed to fluid retention.   She discusses a history of knee replacement and mentions an upcoming appointment for a cortisone shot in her knee, which has been causing instability and making her feel like she might fall.   Patient Active Problem List   Diagnosis Date Noted   Malnutrition of moderate degree 08/17/2023   GAVE (gastric antral vascular ectasia) 08/17/2023   Gastric erosion 08/17/2023   Melena 08/16/2023   Anemia associated with acute blood loss 08/16/2023   Peripheral edema 12/29/2022   Restless leg syndrome 10/03/2021   Anxiety associated with depression 06/10/2020   Port-A-Cath in place 11/12/2018   Malignant neoplasm of upper-outer quadrant of right breast in female, estrogen receptor positive (HCC) 08/08/2018   Right knee pain 03/01/2015  Aortic atherosclerosis (HCC) 12/28/2014   Obesity (BMI 30-39.9) 04/09/2014   Insomnia due to stress 04/09/2014   Atrial fibrillation (HCC) 10/01/2013   GERD (gastroesophageal reflux disease) 10/01/2013   Bipolar disorder (HCC) 09/27/2007   Essential hypertension 09/27/2007   ALLERGIC RHINITIS 09/27/2007   IRRITABLE BOWEL SYNDROME 09/27/2007   Arthropathy 09/27/2007   DEGENERATIVE DISC DISEASE, LUMBOSACRAL SPINE 09/27/2007    is allergic to iohexol, codeine, palonosetron ,  prednisone , sulfonamide derivatives, celecoxib, sulfa antibiotics, and vitamin b12.  MEDICAL HISTORY: Past Medical History:  Diagnosis Date   A-fib (HCC) 11/2012   PAF   Anxiety    takes Ativan    Asthma 04/07/2011   dx   Bipolar disorder (HCC)    Cancer (HCC)    Right breast   Depression    Early cataracts, bilateral    Fatty liver    Fibromyalgia    GERD (gastroesophageal reflux disease)    Irritable bowel syndrome    Mental disorder    dx bipolar   PONV (postoperative nausea and vomiting)    Sleep apnea 04/2011   does not wear CPAP   Tardive dyskinesia     SURGICAL HISTORY: Past Surgical History:  Procedure Laterality Date   ABDOMINAL HYSTERECTOMY     BIOPSY  08/17/2023   Procedure: BIOPSY;  Surgeon: Avram Lupita BRAVO, MD;  Location: [facility];  Service: Gastroenterology;;   COLONOSCOPY     DILATION AND CURETTAGE OF UTERUS  1981   abnormal pap   ESOPHAGOGASTRODUODENOSCOPY (EGD) WITH PROPOFOL  N/A 08/17/2023   Procedure: ESOPHAGOGASTRODUODENOSCOPY (EGD) WITH PROPOFOL ;  Surgeon: Avram Lupita BRAVO, MD;  Location: [facility];  Service: Gastroenterology;  Laterality: N/A;   HOT HEMOSTASIS N/A 08/17/2023   Procedure: HOT HEMOSTASIS (ARGON PLASMA COAGULATION/BICAP);  Surgeon: Avram Lupita BRAVO, MD;  Location: [facility];  Service: Gastroenterology;  Laterality: N/A;   IR IMAGING GUIDED PORT INSERTION  11/20/2023   IR THORACENTESIS ASP PLEURAL SPACE W/IMG GUIDE  09/13/2023   IR THORACENTESIS ASP PLEURAL SPACE W/IMG GUIDE  11/20/2023   IR THORACENTESIS ASP PLEURAL SPACE W/IMG GUIDE  03/20/2024   KNEE ARTHROSCOPY Left 2007   x 2   LUMBAR LAMINECTOMY/DECOMPRESSION MICRODISCECTOMY  05/31/2011   Procedure: LUMBAR LAMINECTOMY/DECOMPRESSION MICRODISCECTOMY;  Surgeon: Reyes JAYSON Billing;  Location: [facility];  Service: Orthopedics;  Laterality: Left;  Decompression Lumbar four to five and  Lumbar five to Sacral one on Left  (X-Ray)   MASTECTOMY W/ SENTINEL NODE BIOPSY Right 09/16/2018   Procedure:  RIGHT MASTECTOMY WITH RIGHT AXILLARY SENTINEL LYMPH NODE BIOPSY;  Surgeon: Ethyl Lenis, MD;  Location: [facility];  Service: General;  Laterality: Right;   PARTIAL HYSTERECTOMY  1982   PORTACATH PLACEMENT Left 10/31/2018   Procedure: INSERTION PORT-A-CATH WITH ULTRASOUND;  Surgeon: Ethyl Lenis, MD;  Location: [facility];  Service: General;  Laterality: Left;   TONSILLECTOMY     as child   TOTAL KNEE ARTHROPLASTY Left 12/18/2005    SOCIAL HISTORY: Social History   Socioeconomic History   Marital status: Married    Spouse name: Lynwood   Number of children: 2   Years of education: 12   Highest education level: Not on file  Occupational History   Occupation: Disabled  Tobacco Use   Smoking status: Never   Smokeless tobacco: Never  Vaping Use   Vaping status: Never Used  Substance and Sexual Activity   Alcohol use: No   Drug use: No   Sexual activity: Yes    Birth control/protection: Post-menopausal, Surgical  Other  Topics Concern   Not on file  Social History Narrative   Tea daily.  Rarely has caffeine    Social Drivers of Corporate investment banker Strain: Patient Declined (10/22/2023)   Overall Financial Resource Strain (CARDIA)    Difficulty of Paying Living Expenses: Patient declined  Food Insecurity: Patient Declined (10/22/2023)   Hunger Vital Sign    Worried About Running Out of Food in the Last Year: Patient declined    Ran Out of Food in the Last Year: Patient declined  Transportation Needs: No Transportation Needs (10/22/2023)   PRAPARE - Administrator, Civil Service (Medical): No    Lack of Transportation (Non-Medical): No  Physical Activity: Unknown (10/22/2023)   Exercise Vital Sign    Days of Exercise per Week: 0 days    Minutes of Exercise per Session: Not on file  Stress: Stress Concern Present (10/22/2023)   [employer] of Occupational Health - Occupational Stress Questionnaire    Feeling of Stress : To some extent  Social  Connections: Unknown (10/22/2023)   Social Connection and Isolation Panel    Frequency of Communication with Friends and Family: More than three times a week    Frequency of Social Gatherings with Friends and Family: Twice a week    Attends Religious Services: Patient declined    Database administrator or Organizations: No    Attends Banker Meetings: Never    Marital Status: Married  Catering manager Violence: Not At Risk (08/16/2023)   Humiliation, Afraid, Rape, and Kick questionnaire    Fear of Current or Ex-Partner: No    Emotionally Abused: No    Physically Abused: No    Sexually Abused: No    FAMILY HISTORY: Family History  Problem Relation Age of Onset   Anesthesia problems Mother    Heart disease Mother        CHF, atrial fib   Heart failure Mother    Anesthesia problems Sister    Colon polyps Father    Heart disease Father        Fluid around the heart   Hypertension Sister    Breast cancer Maternal Aunt    Colon cancer Maternal Aunt    Prostate cancer Neg Hx    Ovarian cancer Neg Hx     Review of Systems  Constitutional:  Negative for appetite change, chills, fatigue, fever and unexpected weight change.  HENT:   Negative for hearing loss, lump/mass and trouble swallowing.   Eyes:  Negative for eye problems and icterus.  Respiratory:  Negative for chest tightness, cough and shortness of breath.   Cardiovascular:  Negative for chest pain, leg swelling and palpitations.  Gastrointestinal:  Negative for abdominal distention, abdominal pain, constipation, diarrhea, nausea and vomiting.  Endocrine: Negative for hot flashes.  Genitourinary:  Negative for difficulty urinating.   Musculoskeletal:  Negative for arthralgias.  Skin:  Negative for itching and rash.  Neurological:  Negative for dizziness, extremity weakness, headaches and numbness.  Hematological:  Negative for adenopathy. Does not bruise/bleed easily.  Psychiatric/Behavioral:  Negative for  depression. The patient is not nervous/anxious.       PHYSICAL EXAMINATION  BP (!) 122/46 (BP Location: Left Arm, Patient Position: Sitting, Cuff Size: Normal)   Pulse 82   Temp 98.6 F (37 C) (Temporal)   Resp 16   Wt 149 lb (67.6 kg)   SpO2 100%   BMI 25.58 kg/m    She appears to be in no  acute distress CTA, no adventitious sounds. RRR No abdominal distension BLE without edema Sister and husband at bedside   LABORATORY DATA:  CBC    Component Value Date/Time   WBC 2.4 (L) 03/18/2024 1244   WBC 2.9 (L) 10/16/2023 1103   RBC 2.25 (L) 03/18/2024 1244   HGB 7.5 (L) 03/18/2024 1244   HGB 12.0 06/05/2022 1024   HCT 23.1 (L) 03/18/2024 1244   HCT 37.4 06/05/2022 1024   PLT 104 (L) 03/18/2024 1244   PLT 251 06/05/2022 1024   MCV 102.7 (H) 03/18/2024 1244   MCV 86 06/05/2022 1024   MCH 33.3 03/18/2024 1244   MCHC 32.5 03/18/2024 1244   RDW 17.9 (H) 03/18/2024 1244   RDW 15.5 (H) 06/05/2022 1024   LYMPHSABS 0.3 (L) 03/18/2024 1244   LYMPHSABS 0.9 06/05/2022 1024   MONOABS 0.2 03/18/2024 1244   EOSABS 0.1 03/18/2024 1244   EOSABS 0.1 06/05/2022 1024   BASOSABS 0.0 03/18/2024 1244   BASOSABS 0.1 06/05/2022 1024    CMP     Component Value Date/Time   NA 140 03/18/2024 1244   NA 145 (H) 06/05/2022 1024   K 4.0 03/18/2024 1244   CL 106 03/18/2024 1244   CO2 29 03/18/2024 1244   GLUCOSE 169 (H) 03/18/2024 1244   BUN 15 03/18/2024 1244   BUN 17 06/05/2022 1024   CREATININE 0.90 03/18/2024 1244   CALCIUM 8.4 (L) 03/18/2024 1244   PROT 5.8 (L) 03/18/2024 1244   PROT 6.6 06/05/2022 1024   ALBUMIN 3.3 (L) 03/18/2024 1244   ALBUMIN 4.0 06/05/2022 1024   AST 48 (H) 03/18/2024 1244   ALT 21 03/18/2024 1244   ALKPHOS 213 (H) 03/18/2024 1244   BILITOT 0.4 03/18/2024 1244   GFRNONAA >60 03/18/2024 1244   GFRAA 81 02/03/2020 1049   GFRAA >60 12/19/2018 1434       ASSESSMENT and THERAPY PLAN:   No problem-specific Assessment & Plan notes found for this  encounter.  Assessment and Plan Assessment & Plan Malignant neoplasm of liver Liver lesion increased from 3.4 cm to 3.8 cm, indicating slight progression. Current Verzenio  dosing not well tolerated due to low blood counts. Disease not rapidly progressing, allowing treatment adjustments.Once again they want us  to titrate verzenio  to 100 mg PO BID one day followed by 100 mg po daily the following day. - Since she tolerated medication poorly and since disease is not rapidly progressing, agreed this was a reasonable approach. We can try this dose, track tumor markers and if there is concern for progression, we can try switching to orserdu or xeloda.   Metastatic breast cancer with malignant pleural effusion, anemia, thrombocytopenia, and chronic neoplasm-related pain Metastatic breast cancer with malignant pleural effusion, anemia, thrombocytopenia, and chronic pain. Improved symptoms post-transfusion and pleural fluid removal. Hemoglobin at 7.5 g/dL, no immediate transfusion needed. Normal WBC, low but acceptable platelets. On Verzenio , Cosamin, Faslodex , and oxycodone . PET scan scheduled for treatment assessment. - Continue Verzenio , Faslodex . - Arrange PET scan to evaluate treatment efficacy. - Plan for potential future pleural fluid drainage on the left side if needed.  Nausea and unintentional weight loss secondary to malignancy or treatment Nausea and weight loss likely due to malignancy or treatment. Nausea manageable, weight fluctuates possibly due to fluid retention.  Anemia, likely secondary to bone marrow involvement from malignancy and verzenio  Last week with labs showing Hb of 7.5 g/dl She got 1 unit of PRBC. We will repeat labs at her next visit.  All questions were  answered. The patient knows to call the clinic with any problems, questions or concerns. We can certainly see the patient much sooner if necessary.  Total encounter time:30 minutes*in face-to-face visit time, chart  review, lab review, care coordination, order entry, and documentation of the encounter time.  *Total Encounter Time as defined by the Centers for Medicare and Medicaid Services includes, in addition to the face-to-face time of a patient visit (documented in the note above) non-face-to-face time: obtaining and reviewing outside history, ordering and reviewing medications, tests or procedures, care coordination (communications with other health care professionals or caregivers) and documentation in the medical record.

## 2024-03-27 NOTE — Progress Notes (Signed)
 [facility] Cancer Follow up:    Doris Mustard, FNP [address] [place] [address]   DIAGNOSIS:  Cancer Staging  Malignant neoplasm of upper-outer quadrant of right breast in female, estrogen receptor positive (HCC) Staging form: Breast, AJCC 8th Edition - Pathologic: Stage IB (pT3, pN76mi, cM0, G2, ER+, PR+, HER2-) - Signed by Crawford Morna Pickle, NP on 10/02/2018 Multigene prognostic tests performed: MammaPrint Histologic grading system: 3 grade system - Clinical stage from 12/28/2022: Stage IV (rcT2, rcN0, rcM1, G3, ER+, PR+, HER2-) - Signed by Loretha Ash, MD on 12/28/2022 Stage prefix: Recurrence Histologic grading system: 3 grade system Laterality: Right Staged by: Pathologist and managing physician Stage used in treatment planning: Yes National guidelines used in treatment planning: Yes Type of national guideline used in treatment planning: NCCN   SUMMARY OF ONCOLOGIC HISTORY: Oncology History  Malignant neoplasm of upper-outer quadrant of right breast in female, estrogen receptor positive (HCC)  08/08/2018 Initial Diagnosis   status post right breast biopsy 08/02/2018 for a clinical T3 N0, stage IIA invasive lobular carcinoma, grade 2, estrogen receptor strongly positive, progesterone receptor 1% positive, with no HER-2 amplification and an MIB-1 of 1%.             (a) CT scan of the head and chest, without contrast 08/23/2018 showed nonspecific 0.4 cm left lower lobe pulmonary nodule, no definitive metastatic disease             (b) bone scan 08/23/2018 shows multiple spinal areas of abnormal uptake, but             (c) total spinal MRI 09/03/2018 finds bone scan findings to be secondary to degenerative disease, no evidence of metastatic disease.   08/2018 - 10/2022 Anti-estrogen oral therapy   Anastrozole ; discontinued 10/14/2018 in preparation for chemotherapy, resumed October 2020   09/16/2018 Surgery   Right mastectomy Jeoffrey)  [phone]): Invasive Lobular Carcinoma, 10.5 cm, grade 2, negative margins. 1 of 5 lymph nodes positive for carcinoma.   09/16/2018 Miscellaneous   MammaPrint high risk suggests a 5-year metastasis free survival of 93% with chemotherapy, with an absolute chemotherapy benefit in the greater than 12% range    09/16/2018 Miscellaneous   Caris testing on mastectomy sample (09/16/2018) showed stable MSI and proficient mismatch repair status, with a low mutational burden; BRCA 1 and 2 were not mutated, PI K3 was not mutated, ER B B2 was not mutated, and AKT 1 was not mutated. 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 Administration: 0.25 mg (11/05/2018), 0.25 mg (11/26/2018), 0.25 mg (12/17/2018), 0.25 mg (01/07/2019), 0.25 mg (01/28/2019), 0.25 mg (02/18/2019), 0.25 mg (03/11/2019), 0.25 mg (04/02/2019)  methotrexate  (PF) chemo injection 84 mg, 39.8 mg/m2 = 84.5 mg, Intravenous,  Once, 5 of 5 cycles. Administration: 84 mg (11/05/2018), 84 mg (11/26/2018), 84 mg (12/17/2018), 84 mg (03/11/2019), 84 mg (04/02/2019)  pegfilgrastim -cbqv (UDENYCA ) injection 6 mg, 6 mg, Subcutaneous, Once, 6 of 6 cycles. Administration: 6 mg (12/19/2018)  cyclophosphamide  (CYTOXAN ) 1,260 mg in sodium chloride  0.9 % 250 mL chemo infusion, 600 mg/m2 = 1,260 mg, Intravenous,  Once, 8 of 8 cycle. Administration: 1,260 mg (11/05/2018), 1,260 mg (11/26/2018), 1,260 mg (12/17/2018), 1,260 mg (01/07/2019), 1,260 mg (01/28/2019), 1,260 mg (02/18/2019), 1,260 mg (03/11/2019), 1,260 mg (04/02/2019)  fluorouracil  (ADRUCIL ) chemo injection 1,250  mg, 600 mg/m2 = 1,250 mg, Intravenous,  Once, 8 of 8 cycles. Administration: 1,250 mg (11/05/2018), 1,250 mg (11/26/2018), 1,250 mg (12/17/2018), 1,250 mg (01/07/2019), 1,250 mg (01/28/2019), 1,250 mg (02/18/2019), 1,250 mg (03/11/2019), 1,250 mg  (04/02/2019).    12/31/2018 - 02/19/2019 Radiation Therapy   The patient initially received a dose of 50.4 Gy in 28 fractions to the chest wall and supraclavicular region. This was delivered using a 3-D conformal, 4 field technique. The patient then received a boost to the mastectomy scar. This delivered an additional 10 Gy in 5 fractions using an en face electron field. The total dose was 60.4 Gy.   09/12/2022 Imaging   Bone scan on 09/12/2022 shows uptake in ribs, manubrium. Widespread osseous metastasis confirmed on PET scan that was completed on October 06, 2022. Right iliac crest biopsy demonstrated metastatic carcinoma consistent with patient's known breast carcinoma. ER 90% positive, PR 90% positive, Ki67 10%, HER2 negative.    10/10/2022 Treatment Plan Change   Faslodex  beginning 10/10/2022; Ibrance  beginning 12/20/2022; Zometa  every 12 weeks 11/07/2022    10/23/2022 - 11/03/2022 Radiation Therapy   Palliative Radiation 10/23/2022-11/03/2022:  left chest wall, lower T spine, and right proximal hip/pelvis were each treated to 30 GY in 10 fractions.      CURRENT THERAPY: Fulvestrant , Zometa , Verzenio   INTERVAL HISTORY:  Discussed the use of AI scribe software for clinical note transcription with the patient, who gave verbal consent to proceed.  History of Present Illness  Doris Smith is a 73 year old female with breast cancer who presents for follow-up while on verzenio  and faslodex  with zometa .   Patient Active Problem List   Diagnosis Date Noted   Malnutrition of moderate degree 08/17/2023   GAVE (gastric antral vascular ectasia) 08/17/2023   Gastric erosion 08/17/2023   Melena 08/16/2023   Anemia associated with acute blood loss 08/16/2023   Peripheral edema 12/29/2022   Restless leg syndrome 10/03/2021   Anxiety associated with depression 06/10/2020   Port-A-Cath in place 11/12/2018   Malignant neoplasm of upper-outer quadrant of right breast in female, estrogen receptor  positive (HCC) 08/08/2018   Right knee pain 03/01/2015   Aortic atherosclerosis (HCC) 12/28/2014   Obesity (BMI 30-39.9) 04/09/2014   Insomnia due to stress 04/09/2014   Atrial fibrillation (HCC) 10/01/2013   GERD (gastroesophageal reflux disease) 10/01/2013   Bipolar disorder (HCC) 09/27/2007   Essential hypertension 09/27/2007   ALLERGIC RHINITIS 09/27/2007   IRRITABLE BOWEL SYNDROME 09/27/2007   Arthropathy 09/27/2007   DEGENERATIVE DISC DISEASE, LUMBOSACRAL SPINE 09/27/2007    is allergic to iohexol, codeine, palonosetron , prednisone , sulfonamide derivatives, celecoxib, sulfa antibiotics, and vitamin b12.  MEDICAL HISTORY: Past Medical History:  Diagnosis Date   A-fib (HCC) 11/2012   PAF   Anxiety    takes Ativan    Asthma 04/07/2011   dx   Bipolar disorder (HCC)    Cancer (HCC)    Right breast   Depression    Early cataracts, bilateral    Fatty liver    Fibromyalgia    GERD (gastroesophageal reflux disease)    Irritable bowel syndrome    Mental disorder    dx bipolar   PONV (postoperative nausea and vomiting)    Sleep apnea 04/2011   does not wear CPAP   Tardive dyskinesia     SURGICAL HISTORY: Past Surgical History:  Procedure Laterality Date   ABDOMINAL HYSTERECTOMY     BIOPSY  08/17/2023   Procedure: BIOPSY;  Surgeon: Avram Lupita BRAVO, MD;  Location: [facility];  Service: Gastroenterology;;   COLONOSCOPY     DILATION AND CURETTAGE OF UTERUS  1981   abnormal pap   ESOPHAGOGASTRODUODENOSCOPY (EGD) WITH PROPOFOL  N/A 08/17/2023   Procedure: ESOPHAGOGASTRODUODENOSCOPY (EGD) WITH PROPOFOL ;  Surgeon: Avram Lupita BRAVO, MD;  Location: [facility];  Service: Gastroenterology;  Laterality: N/A;   HOT HEMOSTASIS N/A 08/17/2023   Procedure: HOT HEMOSTASIS (ARGON PLASMA COAGULATION/BICAP);  Surgeon: Avram Lupita BRAVO, MD;  Location: [facility];  Service: Gastroenterology;  Laterality: N/A;   IR IMAGING GUIDED PORT INSERTION  11/20/2023   IR THORACENTESIS ASP PLEURAL SPACE  W/IMG GUIDE  09/13/2023   IR THORACENTESIS ASP PLEURAL SPACE W/IMG GUIDE  11/20/2023   IR THORACENTESIS ASP PLEURAL SPACE W/IMG GUIDE  03/20/2024   KNEE ARTHROSCOPY Left 2007   x 2   LUMBAR LAMINECTOMY/DECOMPRESSION MICRODISCECTOMY  05/31/2011   Procedure: LUMBAR LAMINECTOMY/DECOMPRESSION MICRODISCECTOMY;  Surgeon: Reyes JAYSON Billing;  Location: [facility];  Service: Orthopedics;  Laterality: Left;  Decompression Lumbar four to five and  Lumbar five to Sacral one on Left  (X-Ray)   MASTECTOMY W/ SENTINEL NODE BIOPSY Right 09/16/2018   Procedure: RIGHT MASTECTOMY WITH RIGHT AXILLARY SENTINEL LYMPH NODE BIOPSY;  Surgeon: Ethyl Lenis, MD;  Location: [facility];  Service: General;  Laterality: Right;   PARTIAL HYSTERECTOMY  1982   PORTACATH PLACEMENT Left 10/31/2018   Procedure: INSERTION PORT-A-CATH WITH ULTRASOUND;  Surgeon: Ethyl Lenis, MD;  Location: [facility];  Service: General;  Laterality: Left;   TONSILLECTOMY     as child   TOTAL KNEE ARTHROPLASTY Left 12/18/2005    SOCIAL HISTORY: Social History   Socioeconomic History   Marital status: Married    Spouse name: Lynwood   Number of children: 2   Years of education: 12   Highest education level: Not on file  Occupational History   Occupation: Disabled  Tobacco Use   Smoking status: Never   Smokeless tobacco: Never  Vaping Use   Vaping status: Never Used  Substance and Sexual Activity   Alcohol use: No   Drug use: No   Sexual activity: Yes    Birth control/protection: Post-menopausal, Surgical  Other Topics Concern   Not on file  Social History Narrative   Tea daily.  Rarely has caffeine    Social Drivers of Corporate investment banker Strain: Patient Declined (10/22/2023)   Overall Financial Resource Strain (CARDIA)    Difficulty of Paying Living Expenses: Patient declined  Food Insecurity: Patient Declined (10/22/2023)   Hunger Vital Sign    Worried About Running Out of Food in the Last Year: Patient declined     Ran Out of Food in the Last Year: Patient declined  Transportation Needs: No Transportation Needs (10/22/2023)   PRAPARE - Administrator, Civil Service (Medical): No    Lack of Transportation (Non-Medical): No  Physical Activity: Unknown (10/22/2023)   Exercise Vital Sign    Days of Exercise per Week: 0 days    Minutes of Exercise per Session: Not on file  Stress: Stress Concern Present (10/22/2023)   [employer] of Occupational Health - Occupational Stress Questionnaire    Feeling of Stress : To some extent  Social Connections: Unknown (10/22/2023)   Social Connection and Isolation Panel    Frequency of Communication with Friends and Family: More than three times a week    Frequency of Social Gatherings with Friends and Family: Twice a week    Attends Religious Services: Patient declined  Active Member of Clubs or Organizations: No    Attends Banker Meetings: Never    Marital Status: Married  Catering manager Violence: Not At Risk (08/16/2023)   Humiliation, Afraid, Rape, and Kick questionnaire    Fear of Current or Ex-Partner: No    Emotionally Abused: No    Physically Abused: No    Sexually Abused: No    FAMILY HISTORY: Family History  Problem Relation Age of Onset   Anesthesia problems Mother    Heart disease Mother        CHF, atrial fib   Heart failure Mother    Anesthesia problems Sister    Colon polyps Father    Heart disease Father        Fluid around the heart   Hypertension Sister    Breast cancer Maternal Aunt    Colon cancer Maternal Aunt    Prostate cancer Neg Hx    Ovarian cancer Neg Hx     Review of Systems  Constitutional:  Negative for appetite change, chills, fatigue, fever and unexpected weight change.  HENT:   Negative for hearing loss, lump/mass and trouble swallowing.   Eyes:  Negative for eye problems and icterus.  Respiratory:  Negative for chest tightness, cough and shortness of breath.   Cardiovascular:   Negative for chest pain, leg swelling and palpitations.  Gastrointestinal:  Negative for abdominal distention, abdominal pain, constipation, diarrhea, nausea and vomiting.  Endocrine: Negative for hot flashes.  Genitourinary:  Negative for difficulty urinating.   Musculoskeletal:  Negative for arthralgias.  Skin:  Negative for itching and rash.  Neurological:  Negative for dizziness, extremity weakness, headaches and numbness.  Hematological:  Negative for adenopathy. Does not bruise/bleed easily.  Psychiatric/Behavioral:  Negative for depression. The patient is not nervous/anxious.       PHYSICAL EXAMINATION  BP (!) 122/46 (BP Location: Left Arm, Patient Position: Sitting, Cuff Size: Normal)   Pulse 82   Temp 98.6 F (37 C) (Temporal)   Resp 16   Wt 149 lb (67.6 kg)   SpO2 100%   BMI 25.58 kg/m    She appears to be in no acute distress CTA, minimal air entry at bases. RRR BLE swelling, symmetrical.   LABORATORY DATA:  CBC    Component Value Date/Time   WBC 2.4 (L) 03/18/2024 1244   WBC 2.9 (L) 10/16/2023 1103   RBC 2.25 (L) 03/18/2024 1244   HGB 7.5 (L) 03/18/2024 1244   HGB 12.0 06/05/2022 1024   HCT 23.1 (L) 03/18/2024 1244   HCT 37.4 06/05/2022 1024   PLT 104 (L) 03/18/2024 1244   PLT 251 06/05/2022 1024   MCV 102.7 (H) 03/18/2024 1244   MCV 86 06/05/2022 1024   MCH 33.3 03/18/2024 1244   MCHC 32.5 03/18/2024 1244   RDW 17.9 (H) 03/18/2024 1244   RDW 15.5 (H) 06/05/2022 1024   LYMPHSABS 0.3 (L) 03/18/2024 1244   LYMPHSABS 0.9 06/05/2022 1024   MONOABS 0.2 03/18/2024 1244   EOSABS 0.1 03/18/2024 1244   EOSABS 0.1 06/05/2022 1024   BASOSABS 0.0 03/18/2024 1244   BASOSABS 0.1 06/05/2022 1024    CMP     Component Value Date/Time   NA 140 03/18/2024 1244   NA 145 (H) 06/05/2022 1024   K 4.0 03/18/2024 1244   CL 106 03/18/2024 1244   CO2 29 03/18/2024 1244   GLUCOSE 169 (H) 03/18/2024 1244   BUN 15 03/18/2024 1244   BUN 17 06/05/2022 1024  CREATININE 0.90 03/18/2024 1244   CALCIUM 8.4 (L) 03/18/2024 1244   PROT 5.8 (L) 03/18/2024 1244   PROT 6.6 06/05/2022 1024   ALBUMIN 3.3 (L) 03/18/2024 1244   ALBUMIN 4.0 06/05/2022 1024   AST 48 (H) 03/18/2024 1244   ALT 21 03/18/2024 1244   ALKPHOS 213 (H) 03/18/2024 1244   BILITOT 0.4 03/18/2024 1244   GFRNONAA >60 03/18/2024 1244   GFRAA 81 02/03/2020 1049   GFRAA >60 12/19/2018 1434       ASSESSMENT and THERAPY PLAN:   No problem-specific Assessment & Plan notes found for this encounter.  Assessment and Plan Assessment & Plan Malignant neoplasm of liver Liver lesion increased from 3.4 cm to 3.8 cm, indicating slight progression. Current Verzenio  dosing not well tolerated due to low blood counts. Disease not rapidly progressing, allowing treatment adjustments.Once again they want us  to titrate verzenio  to 100 mg PO BID one day followed by 100 mg po daily the following day. - Since she tolerated medication poorly and since disease is not rapidly progressing, agreed this was a reasonable approach. We can try this dose, track tumor markers and if there is concern for progression, we can try switching to orserdu or xeloda.   Metastatic breast cancer with bone and lung involvement Tumor marker increase suggests potential tumor activity. - Continue Verzenio  and faslodex  at this time - Schedule scan for end of September to assess tumor growth. - Consider switching to orserdu or xeloda at the time of progression.  Malignant pleural effusion Fluid present but saturating well, no current need for drainage. - Monitor for worsening shortness of breath. - Arrange drainage if shortness of breath worsens.  Neoplasm-related chronic pain Chronic pain managed with current regimen. - Continue current pain medication regimen.  Osteoarthritis of knee, status post prior knee replacement, contralateral knee pending replacement Contralateral knee requires replacement, pending breast cancer  stabilization. - Monitor breast cancer stability before proceeding with knee replacement.  Nausea and vomiting Intermittent symptoms, recent episode of vomiting coffee.  Decreased appetite Variable appetite, managing with nutritional shakes. - Continue nutritional shakes with Boost and ice cream at least once a day.  Constipation Bowel movements difficult but painless. - Continue current bowel regimen.   All questions were answered. The patient knows to call the clinic with any problems, questions or concerns. We can certainly see the patient much sooner if necessary.  Total encounter time:40 minutes*in face-to-face visit time, chart review, lab review, care coordination, order entry, and documentation of the encounter time.  *Total Encounter Time as defined by the Centers for Medicare and Medicaid Services includes, in addition to the face-to-face time of a patient visit (documented in the note above) non-face-to-face time: obtaining and reviewing outside history, ordering and reviewing medications, tests or procedures, care coordination (communications with other health care professionals or caregivers) and documentation in the medical record.

## 2024-04-01 DIAGNOSIS — C50411 Malignant neoplasm of upper-outer quadrant of right female breast: Secondary | ICD-10-CM | POA: Diagnosis not present

## 2024-04-01 DIAGNOSIS — R0602 Shortness of breath: Secondary | ICD-10-CM | POA: Diagnosis not present

## 2024-04-03 ENCOUNTER — Encounter (HOSPITAL_COMMUNITY)
Admission: RE | Admit: 2024-04-03 | Discharge: 2024-04-03 | Disposition: A | Discharge status: Home / Self Care | Source: Ambulatory Visit | Attending: Hematology and Oncology | Admitting: Hematology and Oncology

## 2024-04-03 ENCOUNTER — Telehealth: Payer: Self-pay

## 2024-04-03 DIAGNOSIS — C50411 Malignant neoplasm of upper-outer quadrant of right female breast: Secondary | ICD-10-CM | POA: Insufficient documentation

## 2024-04-03 DIAGNOSIS — Z170 Estrogen receptor positive status [ER+]: Secondary | ICD-10-CM | POA: Diagnosis not present

## 2024-04-03 DIAGNOSIS — C7951 Secondary malignant neoplasm of bone: Secondary | ICD-10-CM | POA: Diagnosis not present

## 2024-04-03 LAB — GLUCOSE, CAPILLARY: Glucose-Capillary: 90 mg/dL (ref 70–99)

## 2024-04-03 MED ORDER — FLUDEOXYGLUCOSE F - 18 (FDG) INJECTION
7.2100 | Freq: Once | INTRAVENOUS | Status: AC | PRN
  Administered 2024-04-03: 7.21 via INTRAVENOUS

## 2024-04-03 NOTE — Telephone Encounter (Signed)
 Pt called and LVM  c/o continued back pain and asking about a back brace. RN spoke with Dr.Iruku who gave the OK for a supportive back brace for comfort in dealing with her back pain. RN then called pt back and discussed recommendations and reviewed pain medication regimen as well. Pt verbalized understanding, no further needs at this time.

## 2024-04-08 DIAGNOSIS — M1711 Unilateral primary osteoarthritis, right knee: Secondary | ICD-10-CM | POA: Diagnosis not present

## 2024-04-09 ENCOUNTER — Other Ambulatory Visit: Payer: Self-pay | Admitting: Hematology and Oncology

## 2024-04-09 ENCOUNTER — Other Ambulatory Visit

## 2024-04-09 ENCOUNTER — Other Ambulatory Visit: Payer: Self-pay

## 2024-04-10 ENCOUNTER — Other Ambulatory Visit: Payer: Self-pay | Admitting: Pharmacist

## 2024-04-10 ENCOUNTER — Telehealth: Payer: Self-pay | Admitting: Pharmacist

## 2024-04-10 ENCOUNTER — Telehealth: Payer: Self-pay

## 2024-04-10 ENCOUNTER — Inpatient Hospital Stay

## 2024-04-10 ENCOUNTER — Inpatient Hospital Stay: Attending: Hematology and Oncology | Admitting: Hematology and Oncology

## 2024-04-10 ENCOUNTER — Other Ambulatory Visit: Payer: Self-pay

## 2024-04-10 ENCOUNTER — Other Ambulatory Visit (HOSPITAL_COMMUNITY): Payer: Self-pay

## 2024-04-10 VITALS — BP 116/58 | HR 69 | Temp 97.8°F | Resp 16 | Wt 145.2 lb

## 2024-04-10 DIAGNOSIS — C7951 Secondary malignant neoplasm of bone: Secondary | ICD-10-CM | POA: Insufficient documentation

## 2024-04-10 DIAGNOSIS — Z170 Estrogen receptor positive status [ER+]: Secondary | ICD-10-CM

## 2024-04-10 DIAGNOSIS — Z5111 Encounter for antineoplastic chemotherapy: Secondary | ICD-10-CM | POA: Insufficient documentation

## 2024-04-10 DIAGNOSIS — Z79899 Other long term (current) drug therapy: Secondary | ICD-10-CM | POA: Insufficient documentation

## 2024-04-10 DIAGNOSIS — C50411 Malignant neoplasm of upper-outer quadrant of right female breast: Secondary | ICD-10-CM | POA: Diagnosis not present

## 2024-04-10 DIAGNOSIS — M810 Age-related osteoporosis without current pathological fracture: Secondary | ICD-10-CM | POA: Insufficient documentation

## 2024-04-10 LAB — CBC WITH DIFFERENTIAL (CANCER CENTER ONLY)
Abs Immature Granulocytes: 0.02 K/uL (ref 0.00–0.07)
Basophils Absolute: 0 K/uL (ref 0.0–0.1)
Basophils Relative: 0 %
Eosinophils Absolute: 0 K/uL (ref 0.0–0.5)
Eosinophils Relative: 0 %
HCT: 22.8 % — ABNORMAL LOW (ref 36.0–46.0)
Hemoglobin: 7.6 g/dL — ABNORMAL LOW (ref 12.0–15.0)
Immature Granulocytes: 1 %
Lymphocytes Relative: 7 %
Lymphs Abs: 0.3 K/uL — ABNORMAL LOW (ref 0.7–4.0)
MCH: 33.5 pg (ref 26.0–34.0)
MCHC: 33.3 g/dL (ref 30.0–36.0)
MCV: 100.4 fL — ABNORMAL HIGH (ref 80.0–100.0)
Monocytes Absolute: 0.3 K/uL (ref 0.1–1.0)
Monocytes Relative: 7 %
Neutro Abs: 3.8 K/uL (ref 1.7–7.7)
Neutrophils Relative %: 85 %
Platelet Count: 112 K/uL — ABNORMAL LOW (ref 150–400)
RBC: 2.27 MIL/uL — ABNORMAL LOW (ref 3.87–5.11)
RDW: 17.9 % — ABNORMAL HIGH (ref 11.5–15.5)
WBC Count: 4.4 K/uL (ref 4.0–10.5)
nRBC: 0.5 % — ABNORMAL HIGH (ref 0.0–0.2)

## 2024-04-10 LAB — CMP (CANCER CENTER ONLY)
ALT: 26 U/L (ref 0–44)
AST: 48 U/L — ABNORMAL HIGH (ref 15–41)
Albumin: 3.5 g/dL (ref 3.5–5.0)
Alkaline Phosphatase: 158 U/L — ABNORMAL HIGH (ref 38–126)
Anion gap: 5 (ref 5–15)
BUN: 25 mg/dL — ABNORMAL HIGH (ref 8–23)
CO2: 29 mmol/L (ref 22–32)
Calcium: 8.9 mg/dL (ref 8.9–10.3)
Chloride: 105 mmol/L (ref 98–111)
Creatinine: 0.89 mg/dL (ref 0.44–1.00)
GFR, Estimated: >60 mL/min (ref >60)
Glucose, Bld: 123 mg/dL — ABNORMAL HIGH (ref 70–99)
Potassium: 4 mmol/L (ref 3.5–5.1)
Sodium: 139 mmol/L (ref 135–145)
Total Bilirubin: 0.3 mg/dL (ref 0.0–1.2)
Total Protein: 6.2 g/dL — ABNORMAL LOW (ref 6.5–8.1)

## 2024-04-10 MED ORDER — ELACESTRANT HYDROCHLORIDE 345 MG PO TABS: 345.0000 mg | ORAL_TABLET | Freq: Every day | ORAL | 1 refills | Status: DC

## 2024-04-10 MED ORDER — FULVESTRANT 250 MG/5ML IM SOSY
500.0000 mg | PREFILLED_SYRINGE | Freq: Once | INTRAMUSCULAR | Status: AC
  Administered 2024-04-10: 500 mg via INTRAMUSCULAR
  Filled 2024-04-10: qty 10

## 2024-04-10 NOTE — Progress Notes (Signed)
 [facility] Cancer Follow up:    Doris Mustard, FNP [address] [place] [address]   DIAGNOSIS:  Cancer Staging  Malignant neoplasm of upper-outer quadrant of right breast in female, estrogen receptor positive (HCC) Staging form: Breast, AJCC 8th Edition - Pathologic: Stage IB (pT3, pN28mi, cM0, G2, ER+, PR+, HER2-) - Signed by Crawford Morna Pickle, NP on 10/02/2018 Multigene prognostic tests performed: MammaPrint Histologic grading system: 3 grade system - Clinical stage from 12/28/2022: Stage IV (rcT2, rcN0, rcM1, G3, ER+, PR+, HER2-) - Signed by Loretha Ash, MD on 12/28/2022 Stage prefix: Recurrence Histologic grading system: 3 grade system Laterality: Right Staged by: Pathologist and managing physician Stage used in treatment planning: Yes National guidelines used in treatment planning: Yes Type of national guideline used in treatment planning: NCCN   SUMMARY OF ONCOLOGIC HISTORY: Oncology History  Malignant neoplasm of upper-outer quadrant of right breast in female, estrogen receptor positive (HCC)  08/08/2018 Initial Diagnosis   status post right breast biopsy 08/02/2018 for a clinical T3 N0, stage IIA invasive lobular carcinoma, grade 2, estrogen receptor strongly positive, progesterone receptor 1% positive, with no HER-2 amplification and an MIB-1 of 1%.             (a) CT scan of the head and chest, without contrast 08/23/2018 showed nonspecific 0.4 cm left lower lobe pulmonary nodule, no definitive metastatic disease             (b) bone scan 08/23/2018 shows multiple spinal areas of abnormal uptake, but             (c) total spinal MRI 09/03/2018 finds bone scan findings to be secondary to degenerative disease, no evidence of metastatic disease.   08/2018 - 10/2022 Anti-estrogen oral therapy   Anastrozole ; discontinued 10/14/2018 in preparation for chemotherapy, resumed October 2020   09/16/2018 Surgery   Right mastectomy Jeoffrey)  [phone]): Invasive Lobular Carcinoma, 10.5 cm, grade 2, negative margins. 1 of 5 lymph nodes positive for carcinoma.   09/16/2018 Miscellaneous   MammaPrint high risk suggests a 5-year metastasis free survival of 93% with chemotherapy, with an absolute chemotherapy benefit in the greater than 12% range    09/16/2018 Miscellaneous   Caris testing on mastectomy sample (09/16/2018) showed stable MSI and proficient mismatch repair status, with a low mutational burden; BRCA 1 and 2 were not mutated, PI K3 was not mutated, ER B B2 was not mutated, and AKT 1 was not mutated. 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 Administration: 0.25 mg (11/05/2018), 0.25 mg (11/26/2018), 0.25 mg (12/17/2018), 0.25 mg (01/07/2019), 0.25 mg (01/28/2019), 0.25 mg (02/18/2019), 0.25 mg (03/11/2019), 0.25 mg (04/02/2019)  methotrexate  (PF) chemo injection 84 mg, 39.8 mg/m2 = 84.5 mg, Intravenous,  Once, 5 of 5 cycles. Administration: 84 mg (11/05/2018), 84 mg (11/26/2018), 84 mg (12/17/2018), 84 mg (03/11/2019), 84 mg (04/02/2019)  pegfilgrastim -cbqv (UDENYCA ) injection 6 mg, 6 mg, Subcutaneous, Once, 6 of 6 cycles. Administration: 6 mg (12/19/2018)  cyclophosphamide  (CYTOXAN ) 1,260 mg in sodium chloride  0.9 % 250 mL chemo infusion, 600 mg/m2 = 1,260 mg, Intravenous,  Once, 8 of 8 cycle. Administration: 1,260 mg (11/05/2018), 1,260 mg (11/26/2018), 1,260 mg (12/17/2018), 1,260 mg (01/07/2019), 1,260 mg (01/28/2019), 1,260 mg (02/18/2019), 1,260 mg (03/11/2019), 1,260 mg (04/02/2019)  fluorouracil  (ADRUCIL ) chemo injection 1,250  mg, 600 mg/m2 = 1,250 mg, Intravenous,  Once, 8 of 8 cycles. Administration: 1,250 mg (11/05/2018), 1,250 mg (11/26/2018), 1,250 mg (12/17/2018), 1,250 mg (01/07/2019), 1,250 mg (01/28/2019), 1,250 mg (02/18/2019), 1,250 mg (03/11/2019), 1,250 mg  (04/02/2019).    12/31/2018 - 02/19/2019 Radiation Therapy   The patient initially received a dose of 50.4 Gy in 28 fractions to the chest wall and supraclavicular region. This was delivered using a 3-D conformal, 4 field technique. The patient then received a boost to the mastectomy scar. This delivered an additional 10 Gy in 5 fractions using an en face electron field. The total dose was 60.4 Gy.   09/12/2022 Imaging   Bone scan on 09/12/2022 shows uptake in ribs, manubrium. Widespread osseous metastasis confirmed on PET scan that was completed on October 06, 2022. Right iliac crest biopsy demonstrated metastatic carcinoma consistent with patient's known breast carcinoma. ER 90% positive, PR 90% positive, Ki67 10%, HER2 negative.    10/10/2022 Treatment Plan Change   Faslodex  beginning 10/10/2022; Ibrance  beginning 12/20/2022; Zometa  every 12 weeks 11/07/2022    10/23/2022 - 11/03/2022 Radiation Therapy   Palliative Radiation 10/23/2022-11/03/2022:  left chest wall, lower T spine, and right proximal hip/pelvis were each treated to 30 GY in 10 fractions.      CURRENT THERAPY: Fulvestrant , Zometa , Verzenio   INTERVAL HISTORY:  Discussed the use of AI scribe software for clinical note transcription with the patient, who gave verbal consent to proceed.  History of Present Illness  Doris Smith is a 73 year old female with metastatic cancer who presents with abdominal pain and constipation.  She has been experiencing abdominal pain, which she attributes to constipation, as her last bowel movement was three to four days ago. She also feels nauseous and carries a bag in case she needs to vomit.  Her oxygen levels are stable. She uses supplemental oxygen at night but does not require it during the day unless necessary.  A recent PET scan showed some areas in the bone and liver that looked a bit worse, while the lung fluid did not look significantly different. She is currently on Verzenio , faslodex   and zometa   She continues to take Tums for relief, which she finds helpful. Her current weight is 145 pounds.  Her son is working on her house, and she is hopeful to move in by Thanksgiving. She expresses a positive outlook and is preparing for the holidays.  Rest of the pertinent 10 point ROS reviewed and neg.   Patient Active Problem List   Diagnosis Date Noted   Malnutrition of moderate degree 08/17/2023   GAVE (gastric antral vascular ectasia) 08/17/2023   Gastric erosion 08/17/2023   Melena 08/16/2023   Anemia associated with acute blood loss 08/16/2023   Peripheral edema 12/29/2022   Restless leg syndrome 10/03/2021   Anxiety associated with depression 06/10/2020   Port-A-Cath in place 11/12/2018   Malignant neoplasm of upper-outer quadrant of right breast in female, estrogen receptor positive (HCC) 08/08/2018   Right knee pain 03/01/2015   Aortic atherosclerosis 12/28/2014   Obesity (BMI 30-39.9) 04/09/2014   Insomnia due to stress 04/09/2014   Atrial fibrillation (HCC) 10/01/2013   GERD (gastroesophageal reflux disease) 10/01/2013   Bipolar disorder (HCC) 09/27/2007   Essential hypertension 09/27/2007   ALLERGIC RHINITIS 09/27/2007   IRRITABLE BOWEL SYNDROME 09/27/2007   Arthropathy 09/27/2007   DEGENERATIVE DISC DISEASE, LUMBOSACRAL SPINE 09/27/2007    is allergic to iohexol, codeine, palonosetron , prednisone , sulfonamide derivatives, celecoxib, sulfa antibiotics, and vitamin b12.  MEDICAL HISTORY: Past Medical History:  Diagnosis Date   A-fib (HCC) 11/2012   PAF   Anxiety    takes Ativan    Asthma 04/07/2011   dx   Bipolar disorder (HCC)    Cancer (HCC)    Right breast   Depression    Early cataracts, bilateral    Fatty liver    Fibromyalgia    GERD (gastroesophageal reflux disease)    Irritable bowel syndrome    Mental disorder    dx bipolar   PONV (postoperative nausea and vomiting)    Sleep apnea 04/2011   does not wear CPAP   Tardive dyskinesia      SURGICAL HISTORY: Past Surgical History:  Procedure Laterality Date   ABDOMINAL HYSTERECTOMY     BIOPSY  08/17/2023   Procedure: BIOPSY;  Surgeon: Avram Lupita BRAVO, MD;  Location: [facility];  Service: Gastroenterology;;   COLONOSCOPY     DILATION AND CURETTAGE OF UTERUS  1981   abnormal pap   ESOPHAGOGASTRODUODENOSCOPY (EGD) WITH PROPOFOL  N/A 08/17/2023   Procedure: ESOPHAGOGASTRODUODENOSCOPY (EGD) WITH PROPOFOL ;  Surgeon: Avram Lupita BRAVO, MD;  Location: [facility];  Service: Gastroenterology;  Laterality: N/A;   HOT HEMOSTASIS N/A 08/17/2023   Procedure: HOT HEMOSTASIS (ARGON PLASMA COAGULATION/BICAP);  Surgeon: Avram Lupita BRAVO, MD;  Location: [facility];  Service: Gastroenterology;  Laterality: N/A;   IR IMAGING GUIDED PORT INSERTION  11/20/2023   IR THORACENTESIS ASP PLEURAL SPACE W/IMG GUIDE  09/13/2023   IR THORACENTESIS ASP PLEURAL SPACE W/IMG GUIDE  11/20/2023   IR THORACENTESIS ASP PLEURAL SPACE W/IMG GUIDE  03/20/2024   KNEE ARTHROSCOPY Left 2007   x 2   LUMBAR LAMINECTOMY/DECOMPRESSION MICRODISCECTOMY  05/31/2011   Procedure: LUMBAR LAMINECTOMY/DECOMPRESSION MICRODISCECTOMY;  Surgeon: Reyes JAYSON Billing;  Location: [facility];  Service: Orthopedics;  Laterality: Left;  Decompression Lumbar four to five and  Lumbar five to Sacral one on Left  (X-Ray)   MASTECTOMY W/ SENTINEL NODE BIOPSY Right 09/16/2018   Procedure: RIGHT MASTECTOMY WITH RIGHT AXILLARY SENTINEL LYMPH NODE BIOPSY;  Surgeon: Ethyl Lenis, MD;  Location: [facility] OR;  Service: General;  Laterality: Right;   PARTIAL HYSTERECTOMY  1982   PORTACATH PLACEMENT Left 10/31/2018   Procedure: INSERTION PORT-A-CATH WITH ULTRASOUND;  Surgeon: Ethyl Lenis, MD;  Location: [facility];  Service: General;  Laterality: Left;   TONSILLECTOMY     as child   TOTAL KNEE ARTHROPLASTY Left 12/18/2005    SOCIAL HISTORY: Social History   Socioeconomic History   Marital status: Married    Spouse name: Lynwood   Number of children:  2   Years of education: 12   Highest education level: Not on file  Occupational History   Occupation: Disabled  Tobacco Use   Smoking status: Never   Smokeless tobacco: Never  Vaping Use   Vaping status: Never Used  Substance and Sexual Activity   Alcohol use: No   Drug use: No   Sexual activity: Yes    Birth control/protection: Post-menopausal, Surgical  Other Topics Concern   Not on file  Social History Narrative   Tea daily.  Rarely has caffeine    Social Drivers of Corporate investment banker Strain: Patient Declined (10/22/2023)   Overall Financial Resource Strain (CARDIA)    Difficulty of Paying Living Expenses: Patient declined  Food Insecurity: Patient Declined (10/22/2023)   Hunger Vital Sign    Worried About Running Out of Food in the Last Year: Patient declined    Ran Out of  Food in the Last Year: Patient declined  Transportation Needs: No Transportation Needs (10/22/2023)   PRAPARE - Administrator, Civil Service (Medical): No    Lack of Transportation (Non-Medical): No  Physical Activity: Unknown (10/22/2023)   Exercise Vital Sign    Days of Exercise per Week: 0 days    Minutes of Exercise per Session: Not on file  Stress: Stress Concern Present (10/22/2023)   [employer] of Occupational Health - Occupational Stress Questionnaire    Feeling of Stress : To some extent  Social Connections: Unknown (10/22/2023)   Social Connection and Isolation Panel    Frequency of Communication with Friends and Family: More than three times a week    Frequency of Social Gatherings with Friends and Family: Twice a week    Attends Religious Services: Patient declined    Database administrator or Organizations: No    Attends Banker Meetings: Never    Marital Status: Married  Catering manager Violence: Not At Risk (08/16/2023)   Humiliation, Afraid, Rape, and Kick questionnaire    Fear of Current or Ex-Partner: No    Emotionally Abused: No     Physically Abused: No    Sexually Abused: No    FAMILY HISTORY: Family History  Problem Relation Age of Onset   Anesthesia problems Mother    Heart disease Mother        CHF, atrial fib   Heart failure Mother    Anesthesia problems Sister    Colon polyps Father    Heart disease Father        Fluid around the heart   Hypertension Sister    Breast cancer Maternal Aunt    Colon cancer Maternal Aunt    Prostate cancer Neg Hx    Ovarian cancer Neg Hx     Review of Systems  Constitutional:  Negative for appetite change, chills, fatigue, fever and unexpected weight change.  HENT:   Negative for hearing loss, lump/mass and trouble swallowing.   Eyes:  Negative for eye problems and icterus.  Respiratory:  Negative for chest tightness, cough and shortness of breath.   Cardiovascular:  Negative for chest pain, leg swelling and palpitations.  Gastrointestinal:  Negative for abdominal distention, abdominal pain, constipation, diarrhea, nausea and vomiting.  Endocrine: Negative for hot flashes.  Genitourinary:  Negative for difficulty urinating.   Musculoskeletal:  Negative for arthralgias.  Skin:  Negative for itching and rash.  Neurological:  Negative for dizziness, extremity weakness, headaches and numbness.  Hematological:  Negative for adenopathy. Does not bruise/bleed easily.  Psychiatric/Behavioral:  Negative for depression. The patient is not nervous/anxious.       PHYSICAL EXAMINATION  BP (!) 116/58 (BP Location: Left Arm, Patient Position: Sitting)   Pulse 69   Temp 97.8 F (36.6 C) (Temporal)   Resp 16   Wt 145 lb 3.2 oz (65.9 kg)   SpO2 99%   BMI 24.92 kg/m    She appears to be in no acute distress No oxygen today  LABORATORY DATA:  CBC    Component Value Date/Time   WBC 4.4 04/10/2024 1032   WBC 2.9 (L) 10/16/2023 1103   RBC 2.27 (L) 04/10/2024 1032   HGB 7.6 (L) 04/10/2024 1032   HGB 12.0 06/05/2022 1024   HCT 22.8 (L) 04/10/2024 1032   HCT 37.4  06/05/2022 1024   PLT 112 (L) 04/10/2024 1032   PLT 251 06/05/2022 1024   MCV 100.4 (H) 04/10/2024 1032  MCV 86 06/05/2022 1024   MCH 33.5 04/10/2024 1032   MCHC 33.3 04/10/2024 1032   RDW 17.9 (H) 04/10/2024 1032   RDW 15.5 (H) 06/05/2022 1024   LYMPHSABS 0.3 (L) 04/10/2024 1032   LYMPHSABS 0.9 06/05/2022 1024   MONOABS 0.3 04/10/2024 1032   EOSABS 0.0 04/10/2024 1032   EOSABS 0.1 06/05/2022 1024   BASOSABS 0.0 04/10/2024 1032   BASOSABS 0.1 06/05/2022 1024    CMP     Component Value Date/Time   NA 140 03/18/2024 1244   NA 145 (H) 06/05/2022 1024   K 4.0 03/18/2024 1244   CL 106 03/18/2024 1244   CO2 29 03/18/2024 1244   GLUCOSE 169 (H) 03/18/2024 1244   BUN 15 03/18/2024 1244   BUN 17 06/05/2022 1024   CREATININE 0.90 03/18/2024 1244   CALCIUM 8.4 (L) 03/18/2024 1244   PROT 5.8 (L) 03/18/2024 1244   PROT 6.6 06/05/2022 1024   ALBUMIN 3.3 (L) 03/18/2024 1244   ALBUMIN 4.0 06/05/2022 1024   AST 48 (H) 03/18/2024 1244   ALT 21 03/18/2024 1244   ALKPHOS 213 (H) 03/18/2024 1244   BILITOT 0.4 03/18/2024 1244   GFRNONAA >60 03/18/2024 1244   GFRAA 81 02/03/2020 1049   GFRAA >60 12/19/2018 1434       ASSESSMENT and THERAPY PLAN:   No problem-specific Assessment & Plan notes found for this encounter.  Assessment and Plan Assessment & Plan  Metastatic breast cancer with bone and liver involvement Progression with worsening liver involvement. Some bone metastases appear worse. Current treatment ineffective. Tumor has ESR1 mutation. - Discontinue Verzenio  upon initiation of new medication. - Initiate Orserdu, one pill once daily. - Monitor liver enzymes and lipid panel. - Continue bone strengthener every three months.  Malignant pleural effusion No current indication for drainage.  Constipation Reports abdominal pain and no bowel movement for three to four days. Continue stool softeners and laxatives.  Nausea Experiencing nausea in waves, possibly related  to cancer treatment or constipation. - Continue Tums as needed. - PRN nausea medication.  Chronic hypoxemia requiring nocturnal supplemental oxygen Oxygen levels well-managed with nocturnal supplemental oxygen.  Follow-up Follow-up required to assess response to new medication and manage ongoing treatment. - Schedule follow-up appointment in six weeks to evaluate response to Orserdu. - Coordinate with pharmacist for medication education and fasting lipid panel. We will repeat labs at her next visit.  All questions were answered. The patient knows to call the clinic with any problems, questions or concerns. We can certainly see the patient much sooner if necessary.  Total encounter time:30 minutes*in face-to-face visit time, chart review, lab review, care coordination, order entry, and documentation of the encounter time.  *Total Encounter Time as defined by the Centers for Medicare and Medicaid Services includes, in addition to the face-to-face time of a patient visit (documented in the note above) non-face-to-face time: obtaining and reviewing outside history, ordering and reviewing medications, tests or procedures, care coordination (communications with other health care professionals or caregivers) and documentation in the medical record.

## 2024-04-10 NOTE — Telephone Encounter (Signed)
 Oral Oncology Patient Advocate Encounter   Received notification that prior authorization for Orserdu is required.   PA submitted on 04/10/2024 Key [id number] Status is pending      Charlott Hamilton,  CPhT-Adv  she/her/hers [facility]  [facility] Pharmacy Technician Patient Advocate Specialist III [facility] Phone: [phone]  Fax: [phone] [email or web address]

## 2024-04-10 NOTE — Telephone Encounter (Signed)
 [facility]    Oncology Clinical Pharmacist Practitioner Initial Assessment  Received new prescription for elacestrant for the treatment of metastatic breast cancer. This is being given monotherapy. It is planned to continue until disease progression or unacceptable toxicity. She continues on zoledronic  acid IV every 12 weeks. Will be given at next visit 04/15/24.  Labs from 04/10/24 assessed. Prescription dose and frequency assessed.   Current medication list in Epic reviewed. Significant DDIs with elacestrant identified:No.  Evaluated chart, patient barriers to medication adherence identified: Yes.  Patient agreement for treatment documented in MD note on 04/10/24.  Prescription has been e-scribed to the [facility]) for benefits analysis and approval.  Oral Oncology Clinic will continue to follow for insurance authorization, copayment issues, initial counseling and start date.  Patient will see clinical pharmacy with baseline lipid panel and receive zoledronic  acid on 04/15/24.  Ziva Nunziata A. Lucila, PharmD, BCOP, CPP Hematology-Oncology Clinical Pharmacist Practitioner  04/10/2024 2:41 PM  **Disclaimer: This note was dictated with voice recognition software. Similar sounding words can inadvertently be transcribed and this note may contain transcription errors which may not have been corrected upon publication of note.**

## 2024-04-10 NOTE — Telephone Encounter (Addendum)
 Oral Oncology Patient Advocate Encounter  Prior Authorization for Orserdu has been approved.    PA# [id number] Effective dates: 04/10/24 through 04/10/2025  Patients co-pay is $0.00. Patient must fill prescription through [facility].    Charlott Hamilton,  CPhT-Adv  she/her/hers [facility]  [facility] Pharmacy Technician Patient Advocate Specialist III [facility] Phone: [phone]  Fax: [phone] [email or web address]

## 2024-04-11 ENCOUNTER — Other Ambulatory Visit: Payer: Self-pay

## 2024-04-14 ENCOUNTER — Telehealth: Payer: Self-pay | Admitting: Nurse Practitioner

## 2024-04-14 NOTE — Telephone Encounter (Signed)
 Scheduled Palliative appointment. Called and spoke with the patient she is aware.

## 2024-04-14 NOTE — Progress Notes (Unsigned)
 [facility]       Telephone: [phone]?Fax: [phone]   Oncology Clinical Pharmacist Practitioner Initial Assessment  Doris Smith is a 73 y.o. female with a diagnosis of breast cancer. They were contacted today via in-person visit.  Indication/Regimen Elacestrant (Orserdu) is being used appropriately for treatment of metastatic breast cancer by Dr. Amber Stalls.      Wt Readings from Last 1 Encounters:  04/10/24 145 lb 3.2 oz (65.9 kg)    Estimated body surface area is 1.73 meters squared as calculated from the following:   Height as of 03/18/24: 5' 4 (1.626 m).   Weight as of 04/10/24: 145 lb 3.2 oz (65.9 kg).  The dosing regimen is 345 mg by mouth daily on days 1 to 28 of a 28-day cycle. This is being given  in combination with zoledronic  acid every 12 weeks started 11/07/22 and due today. It is planned to continue until disease progression or unacceptable toxicity. Prescription dose and frequency assessed for appropriateness. The treatment goal is: Control.  Patient has agreed to treatment which is documented in physician note on 04/10/24. Counseled patient on administration, dosing, side effects, monitoring, drug-food interactions, safe handling, storage, and disposal.  Prescription must be filled at [facility]. She will start once received and see Dr. Stalls with labs on 05/22/24  Dose Modifications None   Access Assessment Doris Smith will be receiving elacestrant through [facility] Insurance Concerns: None  Start date if known: TBD  Adherence Assessment Reviewed importance on keeping a med schedule and plan for any missed doses Barriers to adherence identified? No  Communication and Learning Assessment Primary learner: Patient Barriers to learning: No barriers Preferred language: English Learning preferences: Listening   Allergies Allergies  Allergen Reactions   Iohexol     over 20 years ago had reaction  hurting in arm and heart-Done in [place], [place].SABRAphysician stopped CT at that time.   Codeine Other (See Comments)    Makes feel like she is wild    Palonosetron  Other (See Comments)    headache   Prednisone  Other (See Comments)    Makes me very irritable   Sulfonamide Derivatives Other (See Comments)    Upset stomach   Celecoxib Nausea And Vomiting   Sulfa Antibiotics Nausea And Vomiting   Vitamin B12 Rash    Vitals    04/10/2024   11:03 AM 03/27/2024   10:49 AM 03/19/2024   12:43 PM  Oncology Vitals  Weight 65.862 kg 67.586 kg 70.052 kg  Weight (lbs) 145 lbs 3 oz 149 lbs 154 lbs 7 oz  BMI 24.92 kg/m2 25.58 kg/m2 26.51 kg/m2  Temp 97.8 F (36.6 C) 98.6 F (37 C)   Pulse Rate 69 82   BP 116/58 122/46 101/52  Resp 16 16   SpO2 99 % 100 %   BSA (m2) 1.73 m2 1.75 m2 1.78 m2    Laboratory Data    Latest Ref Rng & Units 04/10/2024   10:32 AM 03/18/2024   12:44 PM 02/28/2024   10:42 AM  CBC EXTENDED  WBC 4.0 - 10.5 K/uL 4.4  2.4  2.4   RBC 3.87 - 5.11 MIL/uL 2.27  2.25  2.37   Hemoglobin 12.0 - 15.0 g/dL 7.6  7.5  7.9   HCT 63.9 - 46.0 % 22.8  23.1  24.0   Platelets 150 - 400 K/uL 112  104  102   NEUT# 1.7 - 7.7 K/uL 3.8  1.8  1.7  Lymph# 0.7 - 4.0 K/uL 0.3  0.3  0.3        Latest Ref Rng & Units 04/10/2024   10:32 AM 03/18/2024   12:44 PM 02/28/2024   10:42 AM  CMP  Glucose 70 - 99 mg/dL 876  830  868   BUN 8 - 23 mg/dL 25  15  13    Creatinine 0.44 - 1.00 mg/dL 9.10  9.09  9.23   Sodium 135 - 145 mmol/L 139  140  141   Potassium 3.5 - 5.1 mmol/L 4.0  4.0  3.8   Chloride 98 - 111 mmol/L 105  106  108   CO2 22 - 32 mmol/L 29  29  30    Calcium 8.9 - 10.3 mg/dL 8.9  8.4  8.7   Total Protein 6.5 - 8.1 g/dL 6.2  5.8  5.3   Total Bilirubin 0.0 - 1.2 mg/dL 0.3  0.4  0.4   Alkaline Phos 38 - 126 U/L 158  213  195   AST 15 - 41 U/L 48  48  42   ALT 0 - 44 U/L 26  21  18     Lab Results  Component Value Date   MG 1.9 03/18/2024   Lab Results  Component Value Date   CA2729  1,210.0 (H) 03/18/2024   CA2729 1,218.3 (H) 02/28/2024   CA2729 1,222.8 (H) 02/13/2024      Latest Ref Rng & Units 08/28/2023    3:54 PM 06/05/2022   10:24 AM 11/14/2021   10:58 AM  LIPIDS  Total cholesterol 0 - 200 mg/dL 850  809  819   HDL cholesterol >40 mg/dL 48  63  65   LDL (calc) 0 - 99 mg/dL 86  896  91   T.Chol/HDL Ratio RATIO 3.1  3.0  2.8   VLDL cholesterol 0 - 40 mg/dL 15     Triglycerides <849 mg/dL 76  859  863      Contraindications Contraindications were reviewed? Yes Contraindications to therapy were identified? No   Safety Precautions The following safety precautions for the use of elacestrant were reviewed:  Fever: reviewed the importance of having a thermometer and the Centers for Disease Control and Prevention (CDC) definition of fever which is 100.92F (38C) or higher. Patient should call 24/7 triage at [phone] if experiencing a fever or any other symptoms Muscle or joint pain or weakness Nausea or vomiting Increased cholesterol levels Handling body fluids and waste Pregnancy, sexual activity, and contraception Avoid grapefruit products Missed doses: If missed for more than 6 hours or vomiting occurs skip the dose and take the next dose the following day at next regularly scheduled time Storage and Handling  Medication Reconciliation Current Outpatient Medications  Medication Sig Dispense Refill   ciprofloxacin  (CIPRO ) 500 MG tablet Take 500 mg by mouth 2 (two) times daily.     cyanocobalamin  1000 MCG tablet Take 1 tablet (1,000 mcg total) by mouth daily. 60 tablet 0   elacestrant hydrochloride (ORSERDU) 345 MG tablet Take 1 tablet (345 mg total) by mouth daily. Take with food. 30 tablet 1   folic acid  (FOLVITE ) 1 MG tablet Take 1 tablet (1 mg total) by mouth daily. (Patient not taking: Reported on 03/27/2024) 60 tablet 0   furosemide  (LASIX ) 20 MG tablet Take 1 tablet (20 mg total) by mouth daily with breakfast for 7 days. (Patient not taking:  Reported on 03/27/2024) 7 tablet 0   Incontinence Supply Disposable (CERTAINTY FITTED BRIEFS XL) MISC  1 Package by Does not apply route as needed. 56 each 3   lidocaine  (LIDODERM ) 5 % Place 2 patches onto the skin daily. Remove & Discard patch within 12 hours or as directed by MD 60 patch 2   lidocaine -prilocaine  (EMLA ) cream Apply 1 Application topically as needed. Apply to port site 30-45 min prior to appointment. 30 g 3   LORazepam  (ATIVAN ) 1 MG tablet Take 1 tablet (1 mg total) by mouth 2 (two) times daily as needed for anxiety. 60 tablet 5   OLANZapine  (ZYPREXA ) 5 MG tablet TAKE 1 TABLET BY MOUTH AT BEDTIME 30 tablet 0   ondansetron  (ZOFRAN ) 8 MG tablet Take 1 tablet (8 mg total) by mouth every 8 (eight) hours as needed for nausea or vomiting. 30 tablet 5   oxyCODONE  (OXYCONTIN ) 15 mg 12 hr tablet Take 1 tablet (15 mg total) by mouth every 12 (twelve) hours. 60 tablet 0   oxyCODONE -acetaminophen  (PERCOCET) 10-325 MG tablet Take 1 tablet by mouth every 4 (four) hours as needed for pain. 60 tablet 0   oxyCODONE -acetaminophen  (PERCOCET) 7.5-325 MG tablet Take 1 tablet by mouth every 8 (eight) hours as needed.     pantoprazole  (PROTONIX ) 40 MG tablet Take 1 tablet (40 mg total) by mouth 2 (two) times daily before a meal. 180 tablet 1   phenazopyridine  (PYRIDIUM ) 200 MG tablet Take 1 tablet (200 mg total) by mouth 3 (three) times daily as needed for pain. (Patient not taking: Reported on 03/27/2024) 10 tablet 0   polyethylene glycol (MIRALAX / GLYCOLAX) 17 g packet Take 17 g by mouth daily.     pramipexole  (MIRAPEX ) 1 MG tablet TAKE 1 TABLET BY MOUTH THREE TIMES DAILY 90 tablet 0   prochlorperazine  (COMPAZINE ) 10 MG tablet Take 1 tablet (10 mg total) by mouth every 6 (six) hours as needed for nausea or vomiting. 30 tablet 2   No current facility-administered medications for this visit.    Medication reconciliation is based on the patient's most recent medication list in the electronic medical record  (EMR) including herbal products and OTC medications.   The patient's medication list was reviewed today with the patient? {YES/NO:21197}  Drug-drug interactions (DDIs) DDIs were evaluated? Yes Significant DDIs identified? {YES/NO:21197}  Drug-Food Interactions Drug-food interactions were evaluated? Yes Drug-food interactions identified? Avoid grapefruit products  Follow-up Plan  Patient education handout given to patient Start elacestrant (Orserdu) *** mg by mouth once daily with food Monitor for side effects Distress thermometer completed during {ksvisittype:33122} and reviewed with patient. Due to score, social work referral has {social work yes/no:33124} *** Doris Smith can follow up with clinical pharmacy as deemed necessary by Dr. KATHERNE going forward   Doris Smith participated in the discussion, expressed understanding, and voiced agreement with the above plan. All questions were answered to her satisfaction. The patient was advised to contact the clinic at [phone] with any questions or concerns prior to her return visit.   I spent 60 minutes assessing the patient.  Zamaria Brazzle A. Lucila, PharmD, BCOP, CPP  Norleen DELENA Lucila, RPH-CPP, 04/14/2024 8:18 AM  **Disclaimer: This note was dictated with voice recognition software. Similar sounding words can inadvertently be transcribed and this note may contain transcription errors which may not have been corrected upon publication of note.**

## 2024-04-15 ENCOUNTER — Inpatient Hospital Stay

## 2024-04-15 ENCOUNTER — Other Ambulatory Visit: Payer: Self-pay

## 2024-04-15 ENCOUNTER — Encounter: Payer: Self-pay | Admitting: Nurse Practitioner

## 2024-04-15 ENCOUNTER — Inpatient Hospital Stay: Admitting: Pharmacist

## 2024-04-15 ENCOUNTER — Other Ambulatory Visit: Payer: Self-pay | Admitting: Nurse Practitioner

## 2024-04-15 VITALS — BP 119/48 | HR 81 | Temp 98.4°F | Resp 16 | Wt 144.0 lb

## 2024-04-15 DIAGNOSIS — Z170 Estrogen receptor positive status [ER+]: Secondary | ICD-10-CM

## 2024-04-15 DIAGNOSIS — D61818 Other pancytopenia: Secondary | ICD-10-CM

## 2024-04-15 DIAGNOSIS — G2581 Restless legs syndrome: Secondary | ICD-10-CM

## 2024-04-15 DIAGNOSIS — D649 Anemia, unspecified: Secondary | ICD-10-CM

## 2024-04-15 DIAGNOSIS — Z5111 Encounter for antineoplastic chemotherapy: Secondary | ICD-10-CM | POA: Diagnosis not present

## 2024-04-15 LAB — LIPID PANEL
Cholesterol: 191 mg/dL (ref 0–200)
HDL: 92 mg/dL (ref >40)
LDL Cholesterol: 85 mg/dL (ref 0–99)
Total CHOL/HDL Ratio: 2.1 ratio
Triglycerides: 70 mg/dL (ref <150)
VLDL: 14 mg/dL (ref 0–40)

## 2024-04-15 MED ORDER — ZOLEDRONIC ACID 4 MG/100ML IV SOLN
4.0000 mg | Freq: Once | INTRAVENOUS | Status: AC
  Administered 2024-04-15: 4 mg via INTRAVENOUS
  Filled 2024-04-15: qty 100

## 2024-04-15 MED ORDER — ACETAMINOPHEN 500 MG PO TABS
500.0000 mg | ORAL_TABLET | Freq: Once | ORAL | Status: AC
  Administered 2024-04-15: 500 mg via ORAL
  Filled 2024-04-15: qty 1

## 2024-04-15 MED ORDER — SODIUM CHLORIDE 0.9 % IV SOLN: Freq: Once | INTRAVENOUS | Status: AC

## 2024-04-15 NOTE — Telephone Encounter (Signed)
 Letter sent

## 2024-04-15 NOTE — Patient Instructions (Signed)

## 2024-04-15 NOTE — Telephone Encounter (Signed)
 MMM NTBS in Dec for 6 mos FU RF sent to pharmacy

## 2024-04-16 ENCOUNTER — Other Ambulatory Visit: Payer: Self-pay

## 2024-04-16 DIAGNOSIS — C50411 Malignant neoplasm of upper-outer quadrant of right female breast: Secondary | ICD-10-CM | POA: Diagnosis not present

## 2024-04-16 DIAGNOSIS — R0602 Shortness of breath: Secondary | ICD-10-CM | POA: Diagnosis not present

## 2024-04-16 NOTE — Progress Notes (Signed)
 Patient has switched therapy and is now filling with [facility] by [facility], dis-enrolling patient.

## 2024-04-17 ENCOUNTER — Other Ambulatory Visit: Payer: Self-pay

## 2024-04-17 ENCOUNTER — Ambulatory Visit (HOSPITAL_COMMUNITY)
Admission: RE | Admit: 2024-04-17 | Discharge: 2024-04-17 | Disposition: A | Discharge status: Home / Self Care | Source: Ambulatory Visit | Attending: Nurse Practitioner | Admitting: Nurse Practitioner

## 2024-04-17 DIAGNOSIS — R0602 Shortness of breath: Secondary | ICD-10-CM | POA: Insufficient documentation

## 2024-04-17 DIAGNOSIS — K449 Diaphragmatic hernia without obstruction or gangrene: Secondary | ICD-10-CM | POA: Diagnosis not present

## 2024-04-17 DIAGNOSIS — Z170 Estrogen receptor positive status [ER+]: Secondary | ICD-10-CM

## 2024-04-17 DIAGNOSIS — J9811 Atelectasis: Secondary | ICD-10-CM | POA: Diagnosis not present

## 2024-04-17 DIAGNOSIS — C50411 Malignant neoplasm of upper-outer quadrant of right female breast: Secondary | ICD-10-CM | POA: Diagnosis not present

## 2024-04-17 DIAGNOSIS — Z515 Encounter for palliative care: Secondary | ICD-10-CM

## 2024-04-17 DIAGNOSIS — I517 Cardiomegaly: Secondary | ICD-10-CM | POA: Diagnosis not present

## 2024-04-17 DIAGNOSIS — J910 Malignant pleural effusion: Secondary | ICD-10-CM | POA: Diagnosis not present

## 2024-04-17 NOTE — Progress Notes (Signed)
 Pt called with increased shortness of breath and increased back pain, medication discussed for home management. Chest x-ray also ordered to rule out another pleural effusion. Pt and husband Verbalized understanding.

## 2024-04-20 ENCOUNTER — Ambulatory Visit: Payer: Self-pay | Admitting: Nurse Practitioner

## 2024-04-21 ENCOUNTER — Encounter: Payer: Self-pay | Admitting: Nurse Practitioner

## 2024-04-21 ENCOUNTER — Telehealth: Payer: Self-pay

## 2024-04-21 ENCOUNTER — Telehealth: Payer: Self-pay | Admitting: Nurse Practitioner

## 2024-04-21 DIAGNOSIS — Z515 Encounter for palliative care: Secondary | ICD-10-CM

## 2024-04-21 DIAGNOSIS — R530 Neoplastic (malignant) related fatigue: Secondary | ICD-10-CM

## 2024-04-21 DIAGNOSIS — R0602 Shortness of breath: Secondary | ICD-10-CM

## 2024-04-21 DIAGNOSIS — Z170 Estrogen receptor positive status [ER+]: Secondary | ICD-10-CM

## 2024-04-21 DIAGNOSIS — C50411 Malignant neoplasm of upper-outer quadrant of right female breast: Secondary | ICD-10-CM | POA: Diagnosis not present

## 2024-04-21 NOTE — Telephone Encounter (Signed)
 Received VM x2 from pt this morning stating she feels plum exhausted' and is asking for her xray results. Our palliative team has attempted calling her and LVM for her to call back. Pt's call was transferred to Palliative team.

## 2024-04-21 NOTE — Telephone Encounter (Signed)
 Attempted multiple times to contact pt, no answer. LVM and callback number.

## 2024-04-21 NOTE — Telephone Encounter (Addendum)
 I connected with Doris Smith on 10/13/82025 at 1515 by telephone and verified that I am speaking with the correct person using two identifiers.   I discussed the limitations, risks, security and privacy concerns of performing an evaluation and management service by telemedicine and the availability of in-person appointments. I also discussed with the patient that there may be a patient responsible charge related to this service. The patient expressed understanding and agreed to proceed.   Other persons participating in the visit and their role in the encounter: [place], Husband    Patient's location: Home   Provider's location: [facility]   Patient complaining of increased shortness of breath, fatigue, and not feeling well overall. I provided updates on recent Chest x-ray. At this time no indication for thoracentesis. Given known anemia will have patient come in for labs and evaluate need for transfusion. She understands if symptoms to contact office or go to ED.   Denies any other symptoms. States feelings are consistent when her hemoglobin has dropped.   Addendum: hemoglobin 7.0. Patient has been called and is aware. She will come in on Wednesday for [facility]. Patient and brother verbalized understanding.   I personally spent a total of 25 minutes in the care of the patient today including getting/reviewing separately obtained history, performing a medically appropriate exam/evaluation, counseling and educating, placing orders, referring and communicating with other health care professionals, documenting clinical information in the EHR, independently interpreting results, communicating results, and coordinating care   Visit consisted of counseling and education dealing with the complex and emotionally intense issues of symptom management and palliative care in the setting of serious and potentially life-threatening illness.  Levon Borer, AGPCNP-BC  Palliative Medicine Team/[facility]

## 2024-04-21 NOTE — Telephone Encounter (Signed)
 Error - duplicate

## 2024-04-22 ENCOUNTER — Other Ambulatory Visit: Payer: Self-pay | Admitting: Nurse Practitioner

## 2024-04-22 ENCOUNTER — Other Ambulatory Visit: Payer: Self-pay | Admitting: Hematology and Oncology

## 2024-04-22 ENCOUNTER — Inpatient Hospital Stay

## 2024-04-22 ENCOUNTER — Telehealth: Payer: Self-pay

## 2024-04-22 ENCOUNTER — Other Ambulatory Visit: Payer: Self-pay

## 2024-04-22 DIAGNOSIS — C7951 Secondary malignant neoplasm of bone: Secondary | ICD-10-CM | POA: Diagnosis not present

## 2024-04-22 DIAGNOSIS — C50411 Malignant neoplasm of upper-outer quadrant of right female breast: Secondary | ICD-10-CM

## 2024-04-22 DIAGNOSIS — Z79899 Other long term (current) drug therapy: Secondary | ICD-10-CM | POA: Diagnosis not present

## 2024-04-22 DIAGNOSIS — Z5111 Encounter for antineoplastic chemotherapy: Secondary | ICD-10-CM | POA: Diagnosis not present

## 2024-04-22 DIAGNOSIS — M810 Age-related osteoporosis without current pathological fracture: Secondary | ICD-10-CM | POA: Diagnosis not present

## 2024-04-22 LAB — CMP (CANCER CENTER ONLY)
ALT: 31 U/L (ref 0–44)
AST: 62 U/L — ABNORMAL HIGH (ref 15–41)
Albumin: 3.3 g/dL — ABNORMAL LOW (ref 3.5–5.0)
Alkaline Phosphatase: 207 U/L — ABNORMAL HIGH (ref 38–126)
Anion gap: 3 — ABNORMAL LOW (ref 5–15)
BUN: 15 mg/dL (ref 8–23)
CO2: 30 mmol/L (ref 22–32)
Calcium: 8.7 mg/dL — ABNORMAL LOW (ref 8.9–10.3)
Chloride: 106 mmol/L (ref 98–111)
Creatinine: 0.86 mg/dL (ref 0.44–1.00)
GFR, Estimated: >60 mL/min (ref >60)
Glucose, Bld: 122 mg/dL — ABNORMAL HIGH (ref 70–99)
Potassium: 4 mmol/L (ref 3.5–5.1)
Sodium: 139 mmol/L (ref 135–145)
Total Bilirubin: 0.4 mg/dL (ref 0.0–1.2)
Total Protein: 5.8 g/dL — ABNORMAL LOW (ref 6.5–8.1)

## 2024-04-22 LAB — CBC WITH DIFFERENTIAL (CANCER CENTER ONLY)
Abs Immature Granulocytes: 0.02 K/uL (ref 0.00–0.07)
Basophils Absolute: 0 K/uL (ref 0.0–0.1)
Basophils Relative: 2 %
Eosinophils Absolute: 0.1 K/uL (ref 0.0–0.5)
Eosinophils Relative: 2 %
HCT: 21.4 % — ABNORMAL LOW (ref 36.0–46.0)
Hemoglobin: 7 g/dL — ABNORMAL LOW (ref 12.0–15.0)
Immature Granulocytes: 1 %
Lymphocytes Relative: 11 %
Lymphs Abs: 0.3 K/uL — ABNORMAL LOW (ref 0.7–4.0)
MCH: 33.8 pg (ref 26.0–34.0)
MCHC: 32.7 g/dL (ref 30.0–36.0)
MCV: 103.4 fL — ABNORMAL HIGH (ref 80.0–100.0)
Monocytes Absolute: 0.2 K/uL (ref 0.1–1.0)
Monocytes Relative: 9 %
Neutro Abs: 2 K/uL (ref 1.7–7.7)
Neutrophils Relative %: 75 %
Platelet Count: 98 K/uL — ABNORMAL LOW (ref 150–400)
RBC: 2.07 MIL/uL — ABNORMAL LOW (ref 3.87–5.11)
RDW: 16.6 % — ABNORMAL HIGH (ref 11.5–15.5)
WBC Count: 2.6 K/uL — ABNORMAL LOW (ref 4.0–10.5)
nRBC: 0 % (ref 0.0–0.2)

## 2024-04-22 LAB — SAMPLE TO BLOOD BANK

## 2024-04-22 LAB — PREPARE RBC (CROSSMATCH)

## 2024-04-22 NOTE — Telephone Encounter (Signed)
 This RN attempted to call patient and left VM. This RN informed Pt of appt on 04/23/2024 at 0900 AM in [facility] for blood transfusion. Call back information provided.

## 2024-04-22 NOTE — Progress Notes (Signed)
 CRITICAL VALUE STICKER  CRITICAL VALUE:hbg 7.0  RECEIVER (on-site recipient of call): Kayla Deshaies, RN  DATE & TIME NOTIFIED: 04/22/2024 1156  MESSENGER (representative from lab): pam   MD NOTIFIED: Levon, NP  TIME OF NOTIFICATION: 1157  RESPONSE: blood ordered

## 2024-04-22 NOTE — Addendum Note (Signed)
 Addended by: TANDA SAILORS E on: 04/22/2024 12:48 PM   Modules accepted: Orders

## 2024-04-23 ENCOUNTER — Inpatient Hospital Stay (HOSPITAL_BASED_OUTPATIENT_CLINIC_OR_DEPARTMENT_OTHER): Admitting: Nurse Practitioner

## 2024-04-23 ENCOUNTER — Inpatient Hospital Stay

## 2024-04-23 ENCOUNTER — Encounter: Payer: Self-pay | Admitting: Hematology and Oncology

## 2024-04-23 DIAGNOSIS — Z515 Encounter for palliative care: Secondary | ICD-10-CM

## 2024-04-23 DIAGNOSIS — G893 Neoplasm related pain (acute) (chronic): Secondary | ICD-10-CM

## 2024-04-23 DIAGNOSIS — C50411 Malignant neoplasm of upper-outer quadrant of right female breast: Secondary | ICD-10-CM

## 2024-04-23 DIAGNOSIS — R530 Neoplastic (malignant) related fatigue: Secondary | ICD-10-CM

## 2024-04-23 DIAGNOSIS — Z5111 Encounter for antineoplastic chemotherapy: Secondary | ICD-10-CM | POA: Diagnosis not present

## 2024-04-23 DIAGNOSIS — Z79899 Other long term (current) drug therapy: Secondary | ICD-10-CM | POA: Diagnosis not present

## 2024-04-23 DIAGNOSIS — R0602 Shortness of breath: Secondary | ICD-10-CM

## 2024-04-23 DIAGNOSIS — Z170 Estrogen receptor positive status [ER+]: Secondary | ICD-10-CM

## 2024-04-23 MED ORDER — SODIUM CHLORIDE 0.9% IV SOLUTION
250.0000 mL | INTRAVENOUS | Status: DC
  Administered 2024-04-23: 100 mL via INTRAVENOUS

## 2024-04-23 MED ORDER — ACETAMINOPHEN 325 MG PO TABS
650.0000 mg | ORAL_TABLET | Freq: Once | ORAL | Status: AC
  Administered 2024-04-23: 650 mg via ORAL
  Filled 2024-04-23: qty 2

## 2024-04-23 NOTE — Patient Instructions (Signed)

## 2024-04-23 NOTE — Progress Notes (Signed)
 At 1225 Pt had c/o 10/10 headache. Mallie ORN PA-C in [facility] made aware. Per Mallie ORN PA-C Pt can have Tylenol  650 mg PO for headache while in [facility] today.

## 2024-04-24 ENCOUNTER — Telehealth: Payer: Self-pay

## 2024-04-24 LAB — BPAM RBC
Blood Product Expiration Date: 202511102359
Blood Product Unit Number: 202511092359
ISSUE DATE / TIME: 202510150912
PRODUCT CODE: 202510150912
PRODUCT CODE: 202511102359
Unit Type and Rh: 5100
Unit Type and Rh: 202511092359
Unit Type and Rh: 5100
Unit Type and Rh: 5100

## 2024-04-24 LAB — TYPE AND SCREEN
ABO/RH(D): O POS
Antibody Screen: NEGATIVE
Unit division: 0
Unit division: 0

## 2024-04-24 NOTE — Telephone Encounter (Signed)
 Pt called c/o nausea and dry heaves since last night. Pt denied taking any PRNs for her nausea, RN discussed use of PRNs and education provided. Pt and husband verbalized understanding. No further needs at this time.

## 2024-04-24 NOTE — Telephone Encounter (Signed)
 Pt called back today and c/o an ongoing headache x 5 days. Pt reports that it comes and goes, nothing makes it worse but tylenol  makes it somewhat better. RN reinforced education by Palliative NP on 04/23/2024 to manage at ome with PRNs and to continue to hydrate and if it continues or worsens to go to the ED to be evaluated. Pt verbalized understanding and agreement.  No further needs at this time.

## 2024-04-26 ENCOUNTER — Encounter: Payer: Self-pay | Admitting: Nurse Practitioner

## 2024-04-26 ENCOUNTER — Encounter: Payer: Self-pay | Admitting: Hematology and Oncology

## 2024-04-26 NOTE — Progress Notes (Signed)
 Palliative Medicine [facility]  Telephone:([phone] [phone]   Name: Doris Smith Date: 04/26/2024 MRN: [id number]  DOB: 07-23-1950  Patient Care Team: [name], FNP as PCP - General (Family Medicine) Jerilynn Lamarr HERO, NP as PCP - Cardiology (Nurse Practitioner) Ethyl Lenis, MD as Consulting Physician (General Surgery) Dewey Rush, MD as Consulting Physician (Radiation Oncology) Laymon Soulier, MD as Consulting Physician (Pulmonary Disease) Kay Kemps, MD as Consulting Physician (Orthopedic Surgery) Duwayne Purchase, MD as Consulting Physician (Orthopedic Surgery) Kallie Medici, NP as Nurse Practitioner (Adult Health Nurse Practitioner) Tyree Nanetta SAILOR, RN as Oncology Nurse Navigator Arelia Filippo, MD as Consulting Physician (Plastic Surgery) Lavona Agent, MD as Consulting Physician (Cardiology) Loretha Ash, MD as Medical Oncologist (Hematology and Oncology) Pickenpack-Cousar, Fannie SAILOR, NP as Nurse Practitioner (Hospice and Palliative Medicine)    INTERVAL HISTORY: Doris Smith is a 73 y.o. female with oncologic medical history including estrogen receptor positive breast cancer(07/2018) s/p right mastectomy. PET scan 10/06/22 showing widespread osseous metastasis. Other pertinent history includes atrial fibrillation, asthma, OSA, GERD, fatty liver, fibromyalgia, osteoporosis, chronic headaches, anxiety, depression, and bipolar disorder. Underwent ORIF of right hip 09/20/2022. Palliative ask to see for symptom and Smith management and goals of care.   SOCIAL HISTORY:     reports that she has never smoked. She has never used smokeless tobacco. She reports that she does not drink alcohol and does not use drugs.  ADVANCE DIRECTIVES:  On file  CODE STATUS: Full code  PAST MEDICAL HISTORY: Past Medical History:  Diagnosis Date   A-fib (HCC) 11/2012   PAF   Anxiety    takes Ativan    Asthma 04/07/2011    dx   Bipolar disorder (HCC)    Cancer (HCC)    Right breast   Depression    Early cataracts, bilateral    Fatty liver    Fibromyalgia    GERD (gastroesophageal reflux disease)    Irritable bowel syndrome    Mental disorder    dx bipolar   PONV (postoperative nausea and vomiting)    Sleep apnea 04/2011   does not wear CPAP   Tardive dyskinesia     ALLERGIES:  is allergic to iohexol, codeine, palonosetron , prednisone , sulfonamide derivatives, celecoxib, sulfa antibiotics, and vitamin b12.  MEDICATIONS:  Current Outpatient Medications  Medication Sig Dispense Refill   cyanocobalamin  1000 MCG tablet Take 1 tablet (1,000 mcg total) by mouth daily. 60 tablet 0   elacestrant hydrochloride (ORSERDU) 345 MG tablet Take 1 tablet (345 mg total) by mouth daily. Take with food. (Patient not taking: Reported on 04/15/2024) 30 tablet 1   Incontinence Supply Disposable (CERTAINTY FITTED BRIEFS XL) MISC 1 Package by Does not apply route as needed. 56 each 3   lidocaine  (LIDODERM ) 5 % Place 2 patches onto the skin daily. Remove & Discard patch within 12 hours or as directed by MD 60 patch 2   lidocaine -prilocaine  (EMLA ) cream Apply 1 Application topically as needed. Apply to port site 30-45 min prior to appointment. 30 g 3   LORazepam  (ATIVAN ) 1 MG tablet Take 1 tablet (1 mg total) by mouth 2 (two) times daily as needed for anxiety. 60 tablet 5   OLANZapine  (ZYPREXA ) 5 MG tablet TAKE 1 TABLET BY MOUTH AT BEDTIME 30 tablet 0   ondansetron  (ZOFRAN ) 8 MG tablet Take 1 tablet (8 mg total) by mouth every 8 (eight) hours as needed for nausea or vomiting. 30 tablet 5   oxyCODONE  (OXYCONTIN ) 15  mg 12 hr tablet Take 1 tablet (15 mg total) by mouth every 12 (twelve) hours. 60 tablet 0   oxyCODONE -acetaminophen  (PERCOCET) 10-325 MG tablet Take 1 tablet by mouth every 4 (four) hours as needed for Smith. 60 tablet 0   pantoprazole  (PROTONIX ) 40 MG tablet Take 1 tablet (40 mg total) by mouth 2 (two) times daily  before a meal. 180 tablet 1   polyethylene glycol (MIRALAX / GLYCOLAX) 17 g packet Take 17 g by mouth daily.     pramipexole  (MIRAPEX ) 1 MG tablet TAKE 1 TABLET BY MOUTH THREE TIMES DAILY 90 tablet 1   prochlorperazine  (COMPAZINE ) 10 MG tablet Take 1 tablet (10 mg total) by mouth every 6 (six) hours as needed for nausea or vomiting. 30 tablet 2   sennosides-docusate sodium  (SENOKOT-S) 8.6-50 MG tablet Take 1 tablet by mouth daily.     No current facility-administered medications for this visit.    VITAL SIGNS: There were no vitals taken for this visit. There were no vitals filed for this visit.    Estimated body mass index is 24.58 kg/m as calculated from the following:   Height as of 03/18/24: 5' 4 (1.626 m).   Weight as of an earlier encounter on 04/23/24: 143 lb 3.2 oz (65 kg).   PERFORMANCE STATUS (ECOG) : 1 - Symptomatic but completely ambulatory  Physical Exam General: Fatigue Cardiovascular: regular rate and rhythm Pulmonary: diminished, wheezing Extremities: no edema, no joint deformities Skin: no rashes Neurological: AAO x3  IMPRESSION: Discussed the use of AI scribe software for clinical note transcription with the patient, who gave verbal consent to proceed.  History of Present Illness Doris Smith is a 73 year old female with cancer who was seen during [facility] visit. Patient called complaining of worsening dyspnea and fatigue. Hemoglobin 7.0 requiring blood transfusion. She is feeling slightly better however is emotional expressing persistent fatigue. Husband shares she did not sleep well last night. Patient was up off and on every hour and somewhat anxious about missing appointment this morning. Doris Smith is complaining of headache and back Smith. States her headache has been ongoing intermittently over the past 3-4 days attributes to her low hemoglobin which is a symptom that she experiences at times in addition to increased fatigue and shortness of breath. Recent  chest x-ray did not show significance to require thoracentesis. We will continue to closely monitor.   I discussed with patient and her husband given she is feeling so poor would recommend further work-up in the headache given headache, fatigue, and just not feeling well! Patient emotional expressing she just wants to go home and feels after her transfusion and returning home she will be able to get some rest and hopefully feel better. She is declining ED work-up at this time but does agree if symptoms worsen and headache persist she will go to hospital for further evaluation.   Smith is controlled with current regimen. No adjustments at this time.   All questions answered and support provided.   Goals of Care Doris Smith is emotional expressing her willingness to live. She states clearly her goal is to continue to treat the treatable allow her every opportunity to continue to thrive.  She speaks to her wishes of returning back to her home which is currently under renovations.  Patient states she is excepting a passing away when God calls her home however she is hopeful that this time is not near.  Emotional support provided.  Patient and family continue to  take things one day at a time.  11/30/22: Doris Smith is emotional. Her husband becomes emotional as patient shares she knows her health is not the best and her cancer is not curable. She is taking life one day at time. Speaks to when her time comes she will be ready but hopeful it is not now. She knows that no one has a definitive time frame. Expresses her appreciation in all of the care and support. Expresses her goal is to continue to treat the treatable allowing her the ability to continue to thrive while not suffering.    4/30: Since the night of 10/30/22, Doris Smith has continued to meet goal of lying in her own bed at night beside her husband. She expresses appreciation for the care of everyone at the cancer center and is hopeful for continued  improvement.  I discussed the importance of continued conversation with family and their medical providers regarding overall plan of care and treatment options, ensuring decisions are within the context of the patients values and GOCs. Assessment & Plan  Doris Smith Chronic Smith related to Doris neoplasm of bone, with a notable knot in the middle of the back causing throbbing and radiating Smith. Current Smith management includes oxycodone  and oxycontin , with adjustments made to optimize relief. - Continue oxycodone  and oxycontin  for Smith management. - Refill medications as requested  Constipation Constipation. - Continue Miralax as needed, considering timing with scheduled procedures.  I will plan to see patient back in 2-3 weeks. Sooner if needed.   Patient expressed understanding and was in agreement with this plan. She also understands that She can call the clinic at any time with any questions, concerns, or complaints.   Any controlled substances utilized were prescribed in the context of palliative care. PDMP has been reviewed.   Visit consisted of counseling and education dealing with the complex and emotionally intense issues of symptom management and palliative care in the setting of serious and potentially life-threatening illness.  Levon Borer, AGPCNP-BC  Palliative Medicine Team/[facility]   bloo

## 2024-04-28 ENCOUNTER — Emergency Department (HOSPITAL_COMMUNITY)
Admission: EM | Admit: 2024-04-28 | Discharge: 2024-04-28 | Disposition: A | Discharge status: Home / Self Care | Source: Ambulatory Visit

## 2024-04-28 ENCOUNTER — Emergency Department (HOSPITAL_COMMUNITY)

## 2024-04-28 ENCOUNTER — Other Ambulatory Visit: Payer: Self-pay

## 2024-04-28 ENCOUNTER — Encounter: Payer: Self-pay | Admitting: Hematology and Oncology

## 2024-04-28 ENCOUNTER — Encounter (HOSPITAL_COMMUNITY): Payer: Self-pay | Admitting: Emergency Medicine

## 2024-04-28 ENCOUNTER — Telehealth: Payer: Self-pay | Admitting: *Deleted

## 2024-04-28 ENCOUNTER — Other Ambulatory Visit (HOSPITAL_COMMUNITY): Payer: Self-pay

## 2024-04-28 DIAGNOSIS — Z853 Personal history of malignant neoplasm of breast: Secondary | ICD-10-CM | POA: Diagnosis not present

## 2024-04-28 DIAGNOSIS — J45909 Unspecified asthma, uncomplicated: Secondary | ICD-10-CM | POA: Insufficient documentation

## 2024-04-28 DIAGNOSIS — J90 Pleural effusion, not elsewhere classified: Secondary | ICD-10-CM | POA: Diagnosis not present

## 2024-04-28 DIAGNOSIS — E860 Dehydration: Secondary | ICD-10-CM | POA: Diagnosis not present

## 2024-04-28 DIAGNOSIS — I10 Essential (primary) hypertension: Secondary | ICD-10-CM | POA: Diagnosis not present

## 2024-04-28 DIAGNOSIS — R1084 Generalized abdominal pain: Secondary | ICD-10-CM | POA: Insufficient documentation

## 2024-04-28 DIAGNOSIS — C787 Secondary malignant neoplasm of liver and intrahepatic bile duct: Secondary | ICD-10-CM | POA: Diagnosis not present

## 2024-04-28 DIAGNOSIS — R918 Other nonspecific abnormal finding of lung field: Secondary | ICD-10-CM | POA: Diagnosis not present

## 2024-04-28 DIAGNOSIS — R519 Headache, unspecified: Secondary | ICD-10-CM

## 2024-04-28 DIAGNOSIS — I517 Cardiomegaly: Secondary | ICD-10-CM | POA: Diagnosis not present

## 2024-04-28 DIAGNOSIS — C50919 Malignant neoplasm of unspecified site of unspecified female breast: Secondary | ICD-10-CM | POA: Diagnosis not present

## 2024-04-28 DIAGNOSIS — C799 Secondary malignant neoplasm of unspecified site: Secondary | ICD-10-CM | POA: Insufficient documentation

## 2024-04-28 DIAGNOSIS — R0602 Shortness of breath: Secondary | ICD-10-CM | POA: Diagnosis not present

## 2024-04-28 DIAGNOSIS — C7951 Secondary malignant neoplasm of bone: Secondary | ICD-10-CM | POA: Diagnosis not present

## 2024-04-28 LAB — URINALYSIS, ROUTINE W REFLEX MICROSCOPIC
Bilirubin Urine: NEGATIVE
Glucose, UA: NEGATIVE mg/dL
Hgb urine dipstick: NEGATIVE
Ketones, ur: NEGATIVE mg/dL
Leukocytes,Ua: NEGATIVE
Nitrite: NEGATIVE
Protein, ur: NEGATIVE mg/dL
Specific Gravity, Urine: 1.012 (ref 1.005–1.030)
pH: 5 (ref 5.0–8.0)

## 2024-04-28 LAB — COMPREHENSIVE METABOLIC PANEL WITH GFR
ALT: 30 U/L (ref 0–44)
AST: 88 U/L — ABNORMAL HIGH (ref 15–41)
Albumin: 3.3 g/dL — ABNORMAL LOW (ref 3.5–5.0)
Alkaline Phosphatase: 205 U/L — ABNORMAL HIGH (ref 38–126)
Anion gap: 9 (ref 5–15)
BUN: 15 mg/dL (ref 8–23)
CO2: 26 mmol/L (ref 22–32)
Calcium: 8.1 mg/dL — ABNORMAL LOW (ref 8.9–10.3)
Chloride: 106 mmol/L (ref 98–111)
Creatinine, Ser: 0.88 mg/dL (ref 0.44–1.00)
GFR, Estimated: >60 mL/min (ref >60)
Glucose, Bld: 112 mg/dL — ABNORMAL HIGH (ref 70–99)
Potassium: 3.9 mmol/L (ref 3.5–5.1)
Sodium: 141 mmol/L (ref 135–145)
Total Bilirubin: 0.3 mg/dL (ref 0.0–1.2)
Total Protein: 5.5 g/dL — ABNORMAL LOW (ref 6.5–8.1)

## 2024-04-28 LAB — CBC
HCT: 30.4 % — ABNORMAL LOW (ref 36.0–46.0)
Hemoglobin: 9.6 g/dL — ABNORMAL LOW (ref 12.0–15.0)
MCH: 32 pg (ref 26.0–34.0)
MCHC: 31.6 g/dL (ref 30.0–36.0)
MCV: 101.3 fL — ABNORMAL HIGH (ref 80.0–100.0)
Platelets: 66 K/uL — ABNORMAL LOW (ref 150–400)
RBC: 3 MIL/uL — ABNORMAL LOW (ref 3.87–5.11)
RDW: 16.5 % — ABNORMAL HIGH (ref 11.5–15.5)
WBC: 2.8 K/uL — ABNORMAL LOW (ref 4.0–10.5)
nRBC: 0 % (ref 0.0–0.2)

## 2024-04-28 LAB — LIPASE, BLOOD: Lipase: 11 U/L (ref 11–51)

## 2024-04-28 LAB — TROPONIN T, HIGH SENSITIVITY: Troponin T High Sensitivity: <15 ng/L (ref 0–19)

## 2024-04-28 LAB — MAGNESIUM: Magnesium: 2.1 mg/dL (ref 1.7–2.4)

## 2024-04-28 MED ORDER — ACETAMINOPHEN 500 MG PO TABS
1000.0000 mg | ORAL_TABLET | Freq: Once | ORAL | Status: AC
  Administered 2024-04-28: 1000 mg via ORAL
  Filled 2024-04-28: qty 2

## 2024-04-28 MED ORDER — DIPHENHYDRAMINE HCL 25 MG PO CAPS
25.0000 mg | ORAL_CAPSULE | Freq: Once | ORAL | Status: AC
  Administered 2024-04-28: 25 mg via ORAL
  Filled 2024-04-28: qty 1

## 2024-04-28 MED ORDER — MAGNESIUM SULFATE IN D5W 1-5 GM/100ML-% IV SOLN
1.0000 g | Freq: Once | INTRAVENOUS | Status: AC
  Administered 2024-04-28: 1 g via INTRAVENOUS
  Filled 2024-04-28: qty 100

## 2024-04-28 MED ORDER — LORAZEPAM 0.5 MG PO TABS
0.5000 mg | ORAL_TABLET | Freq: Once | ORAL | Status: AC
  Administered 2024-04-28: 0.5 mg via ORAL
  Filled 2024-04-28: qty 1

## 2024-04-28 MED ORDER — HYDROXYZINE HCL 25 MG PO TABS: 25.0000 mg | ORAL_TABLET | Freq: Once | ORAL | Status: DC

## 2024-04-28 MED ORDER — LACTATED RINGERS IV BOLUS
1000.0000 mL | Freq: Once | INTRAVENOUS | Status: AC
  Administered 2024-04-28: 1000 mL via INTRAVENOUS

## 2024-04-28 MED ORDER — METOCLOPRAMIDE HCL 5 MG/ML IJ SOLN
10.0000 mg | Freq: Once | INTRAMUSCULAR | Status: AC
  Administered 2024-04-28: 10 mg via INTRAVENOUS
  Filled 2024-04-28: qty 2

## 2024-04-28 NOTE — ED Provider Notes (Signed)
 [place] [facility]ERGENCY DEPARTMENT AT [facility] Provider Note   CSN: [id number] Arrival date & time: 04/28/24  1401     Patient presents with: Headache, Fatigue, and Nausea   Doris Smith is a 73 y.o. female.  {Add pertinent medical, surgical, social history, OB history to HPI:32947}  Headache        Prior to Admission medications   Medication Sig Start Date End Date Taking? Authorizing Provider  cyanocobalamin  1000 MCG tablet Take 1 tablet (1,000 mcg total) by mouth daily. 08/19/23   Jillian Buttery, MD  elacestrant hydrochloride (ORSERDU) 345 MG tablet Take 1 tablet (345 mg total) by mouth daily. Take with food. Patient not taking: Reported on 04/15/2024 04/10/24   Iruku, Praveena, MD  Incontinence Supply Disposable (CERTAINTY FITTED BRIEFS XL) MISC 1 Package by Does not apply route as needed. 11/30/22   Pickenpack-Cousar, Athena N, NP  lidocaine  (LIDODERM ) 5 % Place 2 patches onto the skin daily. Remove & Discard patch within 12 hours or as directed by MD 10/16/23   Pickenpack-Cousar, Athena N, NP  lidocaine -prilocaine  (EMLA ) cream Apply 1 Application topically as needed. Apply to port site 30-45 min prior to appointment. 01/29/24   Pickenpack-Cousar, Athena N, NP  LORazepam  (ATIVAN ) 1 MG tablet Take 1 tablet (1 mg total) by mouth 2 (two) times daily as needed for anxiety. 01/04/24   Gladis, [name], FNP  OLANZapine  (ZYPREXA ) 5 MG tablet TAKE 1 TABLET BY MOUTH AT BEDTIME 04/23/24   Iruku, Praveena, MD  ondansetron  (ZOFRAN ) 8 MG tablet Take 1 tablet (8 mg total) by mouth every 8 (eight) hours as needed for nausea or vomiting. 03/11/24   Gladis [name], FNP  oxyCODONE  (OXYCONTIN ) 15 mg 12 hr tablet Take 1 tablet (15 mg total) by mouth every 12 (twelve) hours. 03/27/24   Pickenpack-Cousar, Athena N, NP  oxyCODONE -acetaminophen  (PERCOCET) 10-325 MG tablet Take 1 tablet by mouth every 4 (four) hours as needed for pain. 09/24/23   Pickenpack-Cousar, Athena N, NP   pantoprazole  (PROTONIX ) 40 MG tablet Take 1 tablet (40 mg total) by mouth 2 (two) times daily before a meal. 01/04/24   Gladis, [name], FNP  polyethylene glycol (MIRALAX / GLYCOLAX) 17 g packet Take 17 g by mouth daily.    [provider]  pramipexole  (MIRAPEX ) 1 MG tablet TAKE 1 TABLET BY MOUTH THREE TIMES DAILY 04/15/24   Gladis, [name], FNP  prochlorperazine  (COMPAZINE ) 10 MG tablet Take 1 tablet (10 mg total) by mouth every 6 (six) hours as needed for nausea or vomiting. 08/02/23   Pickenpack-Cousar, Fannie SAILOR, NP  sennosides-docusate sodium  (SENOKOT-S) 8.6-50 MG tablet Take 1 tablet by mouth daily.    [provider]    Allergies: Iohexol, Codeine, Palonosetron , Prednisone , Sulfonamide derivatives, Celecoxib, Sulfa antibiotics, and Vitamin b12    Review of Systems  Neurological:  Positive for headaches.    Updated Vital Signs BP (!) 125/53 (BP Location: Left Arm)   Pulse 77   Temp 98 F (36.7 C) (Oral)   Resp 13   SpO2 98%   Physical Exam  (all labs ordered are listed, but only abnormal results are displayed) Labs Reviewed  COMPREHENSIVE METABOLIC PANEL WITH GFR - Abnormal; Notable for the following components:      Result Value   Glucose, Bld 112 (*)    Calcium 8.1 (*)    Total Protein 5.5 (*)    Albumin 3.3 (*)    AST 88 (*)    Alkaline Phosphatase 205 (*)    All  other components within normal limits  CBC - Abnormal; Notable for the following components:   WBC 2.8 (*)    RBC 3.00 (*)    Hemoglobin 9.6 (*)    HCT 30.4 (*)    MCV 101.3 (*)    RDW 16.5 (*)    Platelets 66 (*)    All other components within normal limits  LIPASE, BLOOD  URINALYSIS, ROUTINE W REFLEX MICROSCOPIC  MAGNESIUM   TROPONIN T, HIGH SENSITIVITY    EKG: None  Radiology: CT ABDOMEN PELVIS WO CONTRAST Result Date: 04/28/2024 CLINICAL DATA:  Left lower quadrant abdominal pain. Metastatic breast cancer. * Tracking Code: BO * EXAM: CT ABDOMEN AND PELVIS WITHOUT  CONTRAST TECHNIQUE: Multidetector CT imaging of the abdomen and pelvis was performed following the standard protocol without IV contrast. RADIATION DOSE REDUCTION: This exam was performed according to the departmental dose-optimization program which includes automated exposure control, adjustment of the mA and/or kV according to patient size and/or use of iterative reconstruction technique. COMPARISON:  CT scan 01/27/2024 FINDINGS: Lower chest: Persistent small pericardial effusion and persistent moderate-sized bilateral pleural effusions and overlying atelectasis. Stable large hiatal hernia. Hepatobiliary: Interval progression of hepatic metastatic disease with innumerable hepatic lesions. The largest lesion in segment 7 measures a maximum of 5 cm and previously measured 3.8 cm. Several new lesions are also noted, the largest measures 17 mm on image 27/3 in segment 6. No biliary dilatation. The gallbladder is un rim.  Normal caliber common bile duct. Pancreas: No mass, inflammation or ductal dilatation. Stable moderate pancreatic atrophy. Spleen: Normal size.  No focal lesions. Adrenals/Urinary Tract: The adrenal glands are unremarkable. Stable left renal calculus and left-sided hydronephrosis. No hydroureter. Findings likely due to UPJ obstruction. No worrisome renal or bladder lesions without contrast. Stomach/Bowel: The stomach, duodenum, small bowel and colon are grossly normal without oral contrast. No inflammatory changes, mass lesions or obstructive findings. Vascular/Lymphatic: Stable scattered atherosclerotic calcifications involving the aorta and iliac arteries but no aneurysm. No mesenteric or retroperitoneal adenopathy. No pelvic adenopathy. Reproductive: The uterus is surgically absent. Both ovaries are still present and appear normal. Other: No pelvic mass or adenopathy. No free pelvic fluid collections. No inguinal mass or adenopathy. No abdominal wall hernia or subcutaneous lesions. Musculoskeletal:  Stable severe diffuse mixed lytic and sclerotic metastatic bone disease. IMPRESSION: 1. Interval progression of hepatic metastatic disease. 2. Stable severe diffuse mixed lytic and sclerotic metastatic bone disease. 3. Stable small pericardial effusion and moderate-sized bilateral pleural effusions and overlying atelectasis. 4. Stable large hiatal hernia. 5. Stable left renal calculus and left-sided hydronephrosis likely due to UPJ obstruction. 6. Aortic atherosclerosis. Aortic Atherosclerosis (ICD10-I70.0). Electronically Signed   By: MYRTIS Stammer M.D.   On: 04/28/2024 18:17   DG Chest 2 View Result Date: 04/28/2024 CLINICAL DATA:  Shortness of breath weakness EXAM: CHEST - 2 VIEW COMPARISON:  04/17/2024, 03/20/2024, 01/29/2024 FINDINGS: Right-sided central venous port tip at the right atrium. Cardiomegaly with similar left greater than right pleural effusions and basilar airspace disease. Mild diffuse interstitial opacity suggestive of mild edema. Heterogenous sclerosis and lucency involving the shoulders ribs and included left humerus. Multiple compression deformities of the thoracic spine as was seen on prior CT imaging. IMPRESSION: 1. Cardiomegaly with similar left greater than right pleural effusions and basilar airspace disease. Mild diffuse interstitial opacity suggestive of mild edema. 2. Heterogenous sclerosis and lucency involving the shoulders, ribs and included left humerus, consistent with known metastatic disease. Electronically Signed   By: Luke Scott HERO.D.  On: 04/28/2024 18:06   CT Head Wo Contrast Result Date: 04/28/2024 EXAM: CT HEAD WITHOUT CONTRAST 04/28/2024 05:36:31 PM TECHNIQUE: CT of the head was performed without the administration of intravenous contrast. Automated exposure control, iterative reconstruction, and/or weight based adjustment of the mA/kV was utilized to reduce the radiation dose to as low as reasonably achievable. COMPARISON: CT head 08/23/2018. CLINICAL HISTORY:  Headache, new onset (Age >= 51y). Reports severe headache along with nausea, fatigue, and vomiting. FINDINGS: BRAIN AND VENTRICLES: No acute hemorrhage. No evidence of acute infarct. No hydrocephalus. No extra-axial collection. No mass effect or midline shift. ORBITS: No acute abnormality. SINUSES: No acute abnormality. SOFT TISSUES AND SKULL: No acute soft tissue abnormality. No skull fracture. IMPRESSION: 1. No acute intracranial abnormality. Electronically signed by: Gilmore Molt MD 04/28/2024 05:57 PM EDT RP Workstation: [id number]    {Document cardiac monitor, telemetry assessment procedure when appropriate:32947} Procedures   Medications Ordered in the ED  lactated ringers  bolus 1,000 mL (0 mLs Intravenous Stopped 04/28/24 1900)  magnesium  sulfate IVPB 1 g 100 mL (0 g Intravenous Stopped 04/28/24 1832)  acetaminophen  (TYLENOL ) tablet 1,000 mg (1,000 mg Oral Given 04/28/24 1641)  metoCLOPramide (REGLAN) injection 10 mg (10 mg Intravenous Given 04/28/24 1640)  diphenhydrAMINE  (BENADRYL ) capsule 25 mg (25 mg Oral Given 04/28/24 1640)  LORazepam  (ATIVAN ) tablet 0.5 mg (0.5 mg Oral Given 04/28/24 1744)    Clinical Course as of 04/28/24 1920  Mon Apr 28, 2024  1821 1. Interval progression of hepatic metastatic disease. 2. Stable severe diffuse mixed lytic and sclerotic metastatic bone disease. 3. Stable small pericardial effusion and moderate-sized bilateral pleural effusions and overlying atelectasis. 4. Stable large hiatal hernia. 5. Stable left renal calculus and left-sided hydronephrosis likely due to UPJ obstruction. 6. Aortic atherosclerosis.   [WF]  1821 IMPRESSION: 1. Cardiomegaly with similar left greater than right pleural effusions and basilar airspace disease. Mild diffuse interstitial opacity suggestive of mild edema. 2. Heterogenous sclerosis and lucency involving the shoulders, ribs and included left humerus, consistent with known metastatic disease.   [WF]  1821  CT head unremarkable [WF]  1906 Message oncology [WF]    Clinical Course User Index [WF] Neldon Hamp RAMAN, PA   {Click here for ABCD2, HEART and other calculators REFRESH Note before signing:1}                              Medical Decision Making Amount and/or Complexity of Data Reviewed Labs: ordered. Radiology: ordered.  Risk OTC drugs. Prescription drug management.   ***  {Document critical care time when appropriate  Document review of labs and clinical decision tools ie CHADS2VASC2, etc  Document your independent review of radiology images and any outside records  Document your discussion with family members, caretakers and with consultants  Document social determinants of health affecting pt's care  Document your decision making why or why not admission, treatments were needed:32947:::1}   Final diagnoses:  Bad headache    ED Discharge Orders     None

## 2024-04-28 NOTE — Telephone Encounter (Signed)
 Patient called back complaining that the 2 units of blood did not improve her symptoms of severe headache, nausea, fatigue.  States she is taking Oxycontin , Oxycodone , Tylenol  and Zofran  with no improvement.  Per last note by Palliative team patient was instructed to go to the ED for further work up if her symptoms did not improve with the PRn medications and education given by their staff.  Advised her to go to the ED as well.  She stated understanding and plans to go.

## 2024-04-28 NOTE — ED Notes (Signed)
Successful PO challenge

## 2024-04-28 NOTE — ED Triage Notes (Signed)
 Pt. Reports severe headache along with nausea, fatigue, and vomiting. Pt. Reports receiving 2 pints of blood this past Thursday that per report that did not relieve symptoms above.

## 2024-04-28 NOTE — Discharge Instructions (Signed)
 Your workup today showed that you have a reassuringly improved hemoglobin.  Your kidney function looks normal and your electrolytes are reassuringly normal.  You do have progression of your metastatic liver disease and redemonstration of your bony cancers.  You do have fluid in your lungs and around your heart.  Please return the emergency room for any new or concerning symptoms and please follow-up with your oncology team.  He may benefit from an MRI done on the outpatient basis.  Please continue taking your Compazine  as needed for headache/nausea.

## 2024-04-28 NOTE — ED Notes (Addendum)
 Pt wants PORT accessed for blood work

## 2024-04-29 ENCOUNTER — Other Ambulatory Visit: Payer: Self-pay | Admitting: *Deleted

## 2024-04-29 ENCOUNTER — Inpatient Hospital Stay (HOSPITAL_BASED_OUTPATIENT_CLINIC_OR_DEPARTMENT_OTHER): Admitting: Nurse Practitioner

## 2024-04-29 ENCOUNTER — Encounter: Payer: Self-pay | Admitting: Nurse Practitioner

## 2024-04-29 ENCOUNTER — Other Ambulatory Visit: Payer: Self-pay | Admitting: Hematology and Oncology

## 2024-04-29 ENCOUNTER — Ambulatory Visit

## 2024-04-29 ENCOUNTER — Other Ambulatory Visit (HOSPITAL_COMMUNITY): Payer: Self-pay

## 2024-04-29 DIAGNOSIS — R530 Neoplastic (malignant) related fatigue: Secondary | ICD-10-CM | POA: Diagnosis not present

## 2024-04-29 DIAGNOSIS — Z170 Estrogen receptor positive status [ER+]: Secondary | ICD-10-CM

## 2024-04-29 DIAGNOSIS — G893 Neoplasm related pain (acute) (chronic): Secondary | ICD-10-CM | POA: Diagnosis not present

## 2024-04-29 DIAGNOSIS — C50411 Malignant neoplasm of upper-outer quadrant of right female breast: Secondary | ICD-10-CM

## 2024-04-29 DIAGNOSIS — Z515 Encounter for palliative care: Secondary | ICD-10-CM

## 2024-04-29 DIAGNOSIS — M898X9 Other specified disorders of bone, unspecified site: Secondary | ICD-10-CM

## 2024-04-29 MED ORDER — OLANZAPINE 5 MG PO TABS: 5.0000 mg | ORAL_TABLET | Freq: Every day | ORAL | 0 refills | Status: DC

## 2024-04-29 MED ORDER — OXYCODONE HCL ER 15 MG PO T12A
15.0000 mg | EXTENDED_RELEASE_TABLET | Freq: Two times a day (BID) | ORAL | 0 refills | Status: DC
Start: 2024-04-29 — End: 2024-05-27

## 2024-04-29 MED ORDER — OXYCODONE-ACETAMINOPHEN 10-325 MG PO TABS
1.0000 | ORAL_TABLET | ORAL | 0 refills | Status: AC | PRN
Start: 2024-04-29 — End: ?

## 2024-04-29 NOTE — Progress Notes (Signed)
 Refilled pain medication today on behalf of [facility] ETT[facility] [place] is [facility]) Future refills can be directed to [place].

## 2024-04-29 NOTE — Progress Notes (Signed)
 Palliative Medicine [facility]  [phone] [phone]   Name: Gwynne Kemnitz Date: 04/29/2024 MRN: [id number]  DOB: 30-Jan-1951  Patient Care Team: [name], FNP as PCP - General (Family Medicine) Jerilynn Lamarr HERO, NP as PCP - Cardiology (Nurse Practitioner) Ethyl Lenis, MD as Consulting Physician (General Surgery) Dewey Rush, MD as Consulting Physician (Radiation Oncology) Laymon Soulier, MD as Consulting Physician (Pulmonary Disease) Kay Kemps, MD as Consulting Physician (Orthopedic Surgery) Duwayne Purchase, MD as Consulting Physician (Orthopedic Surgery) Kallie Medici, NP as Nurse Practitioner (Adult Health Nurse Practitioner) Tyree Nanetta SAILOR, RN as Oncology Nurse Navigator Arelia Filippo, MD as Consulting Physician (Plastic Surgery) Lavona Agent, MD as Consulting Physician (Cardiology) Loretha Ash, MD as Medical Oncologist (Hematology and Oncology) Pickenpack-Cousar, Fannie SAILOR, NP as Nurse Practitioner [facility] and Palliative Medicine)   I connected with Montie Arloa Klose on 04/29/24 at  1:00 PM EDT by telephone and verified that I am speaking with the correct person using two identifiers.   I discussed the limitations, risks, security and privacy concerns of performing an evaluation and management service by telemedicine and the availability of in-person appointments. I also discussed with the patient that there may be a patient responsible charge related to this service. The patient expressed understanding and agreed to proceed.   Other persons participating in the visit and their role in the encounter: Husband    Patient's location: [place]   Provider's location: [facility]   INTERVAL HISTORY: Alpa Salvo Mooneyham is a 73 y.o. female with oncologic medical history including estrogen receptor positive breast cancer(07/2018) s/p right mastectomy. PET scan 10/06/22 showing widespread osseous metastasis. Other  pertinent history includes atrial fibrillation, asthma, OSA, GERD, fatty liver, fibromyalgia, osteoporosis, chronic headaches, anxiety, depression, and bipolar disorder. Underwent ORIF of right hip 09/20/2022. Palliative ask to see for symptom and pain management and goals of care.   SOCIAL HISTORY:     reports that she has never smoked. She has never used smokeless tobacco. She reports that she does not drink alcohol and does not use drugs.  ADVANCE DIRECTIVES:  On file  CODE STATUS: Full code  PAST MEDICAL HISTORY: Past Medical History:  Diagnosis Date   A-fib (HCC) 11/2012   PAF   Anxiety    takes Ativan    Asthma 04/07/2011   dx   Bipolar disorder (HCC)    Cancer (HCC)    Right breast   Depression    Early cataracts, bilateral    Fatty liver    Fibromyalgia    GERD (gastroesophageal reflux disease)    Irritable bowel syndrome    Mental disorder    dx bipolar   PONV (postoperative nausea and vomiting)    Sleep apnea 04/2011   does not wear CPAP   Tardive dyskinesia     ALLERGIES:  is allergic to iohexol, codeine, palonosetron , prednisone , sulfonamide derivatives, celecoxib, sulfa antibiotics, and vitamin b12.  MEDICATIONS:  Current Outpatient Medications  Medication Sig Dispense Refill   cyanocobalamin  1000 MCG tablet Take 1 tablet (1,000 mcg total) by mouth daily. 60 tablet 0   elacestrant hydrochloride (ORSERDU) 345 MG tablet Take 1 tablet (345 mg total) by mouth daily. Take with food. (Patient not taking: Reported on 04/15/2024) 30 tablet 1   Incontinence Supply Disposable (CERTAINTY FITTED BRIEFS XL) MISC 1 Package by Does not apply route as needed. 56 each 3   lidocaine  (LIDODERM ) 5 % Place 2 patches onto the skin daily. Remove & Discard patch within 12 hours  or as directed by MD 60 patch 2   lidocaine -prilocaine  (EMLA ) cream Apply 1 Application topically as needed. Apply to port site 30-45 min prior to appointment. 30 g 3   LORazepam  (ATIVAN ) 1 MG tablet Take 1  tablet (1 mg total) by mouth 2 (two) times daily as needed for anxiety. 60 tablet 5   OLANZapine  (ZYPREXA ) 5 MG tablet Take 1 tablet (5 mg total) by mouth at bedtime. 30 tablet 0   ondansetron  (ZOFRAN ) 8 MG tablet Take 1 tablet (8 mg total) by mouth every 8 (eight) hours as needed for nausea or vomiting. 30 tablet 5   oxyCODONE  (OXYCONTIN ) 15 mg 12 hr tablet Take 1 tablet (15 mg total) by mouth every 12 (twelve) hours. 60 tablet 0   oxyCODONE -acetaminophen  (PERCOCET) 10-325 MG tablet Take 1 tablet by mouth every 4 (four) hours as needed for pain. 60 tablet 0   pantoprazole  (PROTONIX ) 40 MG tablet Take 1 tablet (40 mg total) by mouth 2 (two) times daily before a meal. 180 tablet 1   polyethylene glycol (MIRALAX / GLYCOLAX) 17 g packet Take 17 g by mouth daily.     pramipexole  (MIRAPEX ) 1 MG tablet TAKE 1 TABLET BY MOUTH THREE TIMES DAILY 90 tablet 1   prochlorperazine  (COMPAZINE ) 10 MG tablet Take 1 tablet (10 mg total) by mouth every 6 (six) hours as needed for nausea or vomiting. 30 tablet 2   sennosides-docusate sodium  (SENOKOT-S) 8.6-50 MG tablet Take 1 tablet by mouth daily.     No current facility-administered medications for this visit.    VITAL SIGNS: There were no vitals taken for this visit. There were no vitals filed for this visit.    Estimated body mass index is 24.58 kg/m as calculated from the following:   Height as of 03/18/24: 5' 4 (1.626 m).   Weight as of 04/23/24: 143 lb 3.2 oz (65 kg).   PERFORMANCE STATUS (ECOG) : 1 - Symptomatic but completely ambulatory  IMPRESSION: Discussed the use of AI scribe software for clinical note transcription with the patient, who gave verbal consent to proceed.  History of Present Illness Marlin Brys is a 73 year old female with cancer who I connected with by phone due to concerns of not feeling well. Patient was seen last week in clinic and received 2 unit packed red blood cells due to hemoglobin 7.0.  She reports  continued feelings of weakness, fatigue, and headache resulting in recent emergency room visit. Reports receiving IL IVF improved symptoms. Now feels headache has improved and fatigue is not as severe.   Discussed imaging with now moderate pleural effusions. She denies changes in respiratory symptoms. Declines thoracentesis at this time. Will re-evaluate at next visit. Patient knows to contact office if symptoms worsen.   Reports pain is controlled on current regimen. No adjustments at this time.   We will continue to closely follow and support.   Goals of Care Mrs. Halbleib is emotional expressing her willingness to live. She states clearly her goal is to continue to treat the treatable allow her every opportunity to continue to thrive.  She speaks to her wishes of returning back to her home which is currently under renovations.  Patient states she is excepting a passing away when God calls her home however she is hopeful that this time is not near.  Emotional support provided.  Patient and family continue to take things one day at a time.  11/30/22: Mrs. Gunning is emotional. Her husband becomes  emotional as patient shares she knows her health is not the best and her cancer is not curable. She is taking life one day at time. Speaks to when her time comes she will be ready but hopeful it is not now. She knows that no one has a definitive time frame. Expresses her appreciation in all of the care and support. Expresses her goal is to continue to treat the treatable allowing her the ability to continue to thrive while not suffering.    4/30: Since the night of 10/30/22, Mrs. Delaine has continued to meet goal of lying in her own bed at night beside her husband. She expresses appreciation for the care of everyone at the cancer center and is hopeful for continued improvement.  I discussed the importance of continued conversation with family and their medical providers regarding overall plan of care and treatment  options, ensuring decisions are within the context of the patients values and GOCs. Assessment & Plan Malignant neoplasm of bone with chronic neoplasm-related pain Chronic pain related to malignant neoplasm of bone, with a notable knot in the middle of the back causing throbbing and radiating pain. Current pain management includes oxycodone  and oxycontin , with adjustments made to optimize relief. - Continue oxycodone  and oxycontin  for pain management. - Refill medications as requested  Constipation Constipation. - Continue Miralax as needed, considering timing with scheduled procedures.  Fatigue Improved since ED visit and 1L IVF in addition to [facility] on last week.   I will plan to see patient back in 2-3 weeks. Sooner if needed.   Patient expressed understanding and was in agreement with this plan. She also understands that She can call the clinic at any time with any questions, concerns, or complaints.   Any controlled substances utilized were prescribed in the context of palliative care. PDMP has been reviewed.   I personally spent a total of 25 minutes in the care of the patient today including preparing to see the patient, getting/reviewing separately obtained history, performing a medically appropriate exam/evaluation, counseling and educating, referring and communicating with other health care professionals, documenting clinical information in the EHR, independently interpreting results, communicating results, and coordinating care. Visit consisted of counseling and education dealing with the complex and emotionally intense issues of symptom management and palliative care in the setting of serious and potentially life-threatening illness.  Levon Borer, AGPCNP-BC  Palliative Medicine Team/[facility]   bloo

## 2024-04-30 ENCOUNTER — Other Ambulatory Visit (HOSPITAL_COMMUNITY): Payer: Self-pay

## 2024-04-30 ENCOUNTER — Telehealth: Payer: Self-pay

## 2024-04-30 ENCOUNTER — Other Ambulatory Visit: Payer: Self-pay

## 2024-04-30 NOTE — Telephone Encounter (Signed)
 Pt called requesting if pain medication refills were sent per telephone conversation with Fannie Levon Borer, NP on 04/29/2024.  Pt stated she called [facility] and they do not have refills for the pain medications on file.  Reviewed pt's chart and informed pt that the refills were sent to [facility] in [facility].  Pt stated she will pick the refills up from [facility] and had no further questions or concerns.

## 2024-05-01 ENCOUNTER — Encounter: Payer: Self-pay | Admitting: Hematology and Oncology

## 2024-05-01 ENCOUNTER — Other Ambulatory Visit (HOSPITAL_BASED_OUTPATIENT_CLINIC_OR_DEPARTMENT_OTHER): Payer: Self-pay

## 2024-05-01 ENCOUNTER — Other Ambulatory Visit: Payer: Self-pay

## 2024-05-01 DIAGNOSIS — J910 Malignant pleural effusion: Secondary | ICD-10-CM

## 2024-05-01 DIAGNOSIS — Z515 Encounter for palliative care: Secondary | ICD-10-CM

## 2024-05-01 MED ORDER — NYSTATIN 100000 UNIT/ML MT SUSP
OROMUCOSAL | 1 refills | Status: DC
  Filled 2024-05-01: qty 150, 10d supply, fill #0

## 2024-05-01 NOTE — Progress Notes (Signed)
 Pt called c/o not feeling herself she reports fatigue, pain in right ribs, indigestion not resolved by medications, white patches on tongue, and frustration. Emotional support provided, pt declined any jaw or arm pain, improved headaches, and also mentioned increased SOB. Per Levon, NP pt ordered to have a thoracentesis for pain/fluid buildup as well and SOB. Pt also to take magic mouthwash for indigestion and white patches in mouth. Pt and husband lynwood educated on new med and strict ED return precautions. Verbalized understanding and agreement with plan.

## 2024-05-05 ENCOUNTER — Other Ambulatory Visit: Payer: Self-pay | Admitting: Nurse Practitioner

## 2024-05-05 DIAGNOSIS — Z515 Encounter for palliative care: Secondary | ICD-10-CM

## 2024-05-05 DIAGNOSIS — C50411 Malignant neoplasm of upper-outer quadrant of right female breast: Secondary | ICD-10-CM

## 2024-05-05 DIAGNOSIS — R110 Nausea: Secondary | ICD-10-CM

## 2024-05-06 ENCOUNTER — Encounter: Payer: Self-pay | Admitting: Adult Health

## 2024-05-06 ENCOUNTER — Inpatient Hospital Stay (HOSPITAL_BASED_OUTPATIENT_CLINIC_OR_DEPARTMENT_OTHER): Admitting: Adult Health

## 2024-05-06 ENCOUNTER — Ambulatory Visit (HOSPITAL_COMMUNITY)
Admission: RE | Admit: 2024-05-06 | Discharge: 2024-05-06 | Disposition: A | Discharge status: Home / Self Care | Source: Ambulatory Visit | Attending: Nurse Practitioner | Admitting: Nurse Practitioner

## 2024-05-06 ENCOUNTER — Inpatient Hospital Stay

## 2024-05-06 ENCOUNTER — Ambulatory Visit (HOSPITAL_COMMUNITY)
Admission: RE | Admit: 2024-05-06 | Discharge: 2024-05-06 | Disposition: A | Discharge status: Home / Self Care | Source: Ambulatory Visit | Attending: Radiology | Admitting: Radiology

## 2024-05-06 VITALS — BP 153/68 | HR 70 | Resp 12

## 2024-05-06 VITALS — BP 130/65 | HR 84 | Resp 15 | Ht 64.0 in | Wt 139.9 lb

## 2024-05-06 DIAGNOSIS — J910 Malignant pleural effusion: Secondary | ICD-10-CM | POA: Insufficient documentation

## 2024-05-06 DIAGNOSIS — R110 Nausea: Secondary | ICD-10-CM | POA: Diagnosis not present

## 2024-05-06 DIAGNOSIS — C801 Malignant (primary) neoplasm, unspecified: Secondary | ICD-10-CM | POA: Diagnosis not present

## 2024-05-06 DIAGNOSIS — C50411 Malignant neoplasm of upper-outer quadrant of right female breast: Secondary | ICD-10-CM | POA: Diagnosis not present

## 2024-05-06 DIAGNOSIS — J90 Pleural effusion, not elsewhere classified: Secondary | ICD-10-CM | POA: Diagnosis not present

## 2024-05-06 DIAGNOSIS — Z515 Encounter for palliative care: Secondary | ICD-10-CM

## 2024-05-06 DIAGNOSIS — Z79899 Other long term (current) drug therapy: Secondary | ICD-10-CM | POA: Diagnosis not present

## 2024-05-06 DIAGNOSIS — C50919 Malignant neoplasm of unspecified site of unspecified female breast: Secondary | ICD-10-CM | POA: Diagnosis not present

## 2024-05-06 DIAGNOSIS — K30 Functional dyspepsia: Secondary | ICD-10-CM

## 2024-05-06 DIAGNOSIS — Z5111 Encounter for antineoplastic chemotherapy: Secondary | ICD-10-CM | POA: Diagnosis not present

## 2024-05-06 DIAGNOSIS — M810 Age-related osteoporosis without current pathological fracture: Secondary | ICD-10-CM | POA: Diagnosis not present

## 2024-05-06 DIAGNOSIS — Z170 Estrogen receptor positive status [ER+]: Secondary | ICD-10-CM

## 2024-05-06 HISTORY — PX: IR THORACENTESIS ASP PLEURAL SPACE W/IMG GUIDE: IMG5380

## 2024-05-06 LAB — CBC WITH DIFFERENTIAL (CANCER CENTER ONLY)
Abs Immature Granulocytes: 0.01 K/uL (ref 0.00–0.07)
Basophils Absolute: 0.1 K/uL (ref 0.0–0.1)
Basophils Relative: 1 %
Eosinophils Absolute: 0 K/uL (ref 0.0–0.5)
Eosinophils Relative: 1 %
HCT: 32 % — ABNORMAL LOW (ref 36.0–46.0)
Hemoglobin: 10.7 g/dL — ABNORMAL LOW (ref 12.0–15.0)
Immature Granulocytes: 0 %
Lymphocytes Relative: 8 %
Lymphs Abs: 0.4 K/uL — ABNORMAL LOW (ref 0.7–4.0)
MCH: 32.8 pg (ref 26.0–34.0)
MCHC: 33.4 g/dL (ref 30.0–36.0)
MCV: 98.2 fL (ref 80.0–100.0)
Monocytes Absolute: 0.4 K/uL (ref 0.1–1.0)
Monocytes Relative: 9 %
Neutro Abs: 3.6 K/uL (ref 1.7–7.7)
Neutrophils Relative %: 81 %
Platelet Count: 117 K/uL — ABNORMAL LOW (ref 150–400)
RBC: 3.26 MIL/uL — ABNORMAL LOW (ref 3.87–5.11)
RDW: 15.9 % — ABNORMAL HIGH (ref 11.5–15.5)
WBC Count: 4.5 K/uL (ref 4.0–10.5)
nRBC: 0 % (ref 0.0–0.2)

## 2024-05-06 LAB — CMP (CANCER CENTER ONLY)
ALT: 31 U/L (ref 0–44)
AST: 94 U/L — ABNORMAL HIGH (ref 15–41)
Albumin: 3.5 g/dL (ref 3.5–5.0)
Alkaline Phosphatase: 225 U/L — ABNORMAL HIGH (ref 38–126)
Anion gap: 8 (ref 5–15)
BUN: 15 mg/dL (ref 8–23)
CO2: 26 mmol/L (ref 22–32)
Calcium: 8.2 mg/dL — ABNORMAL LOW (ref 8.9–10.3)
Chloride: 105 mmol/L (ref 98–111)
Creatinine: 0.96 mg/dL (ref 0.44–1.00)
GFR, Estimated: >60 mL/min (ref >60)
Glucose, Bld: 110 mg/dL — ABNORMAL HIGH (ref 70–99)
Potassium: 4.1 mmol/L (ref 3.5–5.1)
Sodium: 139 mmol/L (ref 135–145)
Total Bilirubin: 0.4 mg/dL (ref 0.0–1.2)
Total Protein: 6.3 g/dL — ABNORMAL LOW (ref 6.5–8.1)

## 2024-05-06 LAB — MAGNESIUM: Magnesium: 2 mg/dL (ref 1.7–2.4)

## 2024-05-06 MED ORDER — LIDOCAINE-EPINEPHRINE 1 %-1:100000 IJ SOLN
INTRAMUSCULAR | Status: AC
  Filled 2024-05-06: qty 1

## 2024-05-06 MED ORDER — SODIUM CHLORIDE 0.9 % IV SOLN: Freq: Once | INTRAVENOUS | Status: AC

## 2024-05-06 MED ORDER — LIDOCAINE-EPINEPHRINE 1 %-1:100000 IJ SOLN
20.0000 mL | Freq: Once | INTRAMUSCULAR | Status: AC
  Administered 2024-05-06: 5 mL via INTRADERMAL

## 2024-05-06 MED ORDER — LIDOCAINE VISCOUS HCL 2 % MT SOLN
15.0000 mL | Freq: Once | OROMUCOSAL | Status: AC
  Administered 2024-05-06: 15 mL via OROMUCOSAL
  Filled 2024-05-06: qty 15

## 2024-05-06 MED ORDER — FAMOTIDINE IN NACL 20-0.9 MG/50ML-% IV SOLN
20.0000 mg | Freq: Once | INTRAVENOUS | Status: AC
  Administered 2024-05-06: 20 mg via INTRAVENOUS
  Filled 2024-05-06: qty 50

## 2024-05-06 MED ORDER — SODIUM CHLORIDE 0.9 % IV SOLN: INTRAVENOUS | Status: AC

## 2024-05-06 MED ORDER — ALUM & MAG HYDROXIDE-SIMETH 200-200-20 MG/5ML PO SUSP
30.0000 mL | Freq: Once | ORAL | Status: AC
  Administered 2024-05-06: 30 mL via ORAL
  Filled 2024-05-06: qty 30

## 2024-05-06 NOTE — Progress Notes (Signed)
 Orders placed under [facility] for viscous lidocaine  and Mag-Al Plus for RN to mix at bedside and have pt swish and swallow for indigestion/reflux per Morna Kendall, NP.  Laquida Cotrell, PharmD, MBA

## 2024-05-06 NOTE — Procedures (Signed)
 Ultrasound-guided  therapeutic left thoracentesis performed yielding 500 cc of yellow fluid. No immediate complications. Follow-up chest x-ray pending. EBL none.

## 2024-05-06 NOTE — Patient Instructions (Addendum)
 GERD in Adults: What to Know  Gastroesophageal reflux (GER) is when acid from your stomach flows up into your esophagus. Your esophagus is the part of your body that moves food from your mouth to your stomach. Normally, food goes down and stays in your stomach to be digested. But with GER, food and stomach acid may go back up. You may have a disease called gastroesophageal reflux disease (GERD) if the reflux: Happens often. Causes very bad symptoms. Makes your esophagus sore and swollen. Over time, GERD can make small holes called ulcers in the lining of your esophagus. What are the causes? GERD is caused by a problem with the muscle between your esophagus and stomach. This muscle is called the lower esophageal sphincter (LES). When it's weak or not normal, it doesn't close like it should. This means food and stomach acid can go back up into your esophagus. The muscle can be weak if: You smoke or use products with tobacco in them. You're pregnant. You have a type of hernia called a hiatal hernia. You eat certain foods and drinks. These include: Alcohol. Coffee. Chocolate. Onions. Peppermint. What increases the risk? Being overweight. Having a disease that affects your connective tissue. Taking NSAIDs, such as ibuprofen. What are the signs or symptoms? Heartburn. Trouble swallowing. Pain when you swallow. The feeling of having a lump in your throat. A bitter taste in your mouth. Bad breath. Having an upset or bloated stomach. Burping. Chest pain. Other conditions can also cause chest pain. Make sure you see your health care provider if you have chest pain. Wheezing. This is when you make high-pitched whistling sounds when you breathe, most often when you breathe out. A long-term cough or a cough at night. How is this diagnosed? GERD may be diagnosed based on your medical history and a physical exam. You may also have tests. These may include: An endoscopy. This test looks at your  stomach and esophagus with a small camera. A barium swallow test. This shows the shape and size of your esophagus and how well it's working. Tests of your esophagus to check for: Acid levels. Pressure. How is this treated? Treatment may depend on how bad your symptoms are. It may include: Changes to your diet and daily life. Medicines. Surgery. Follow these instructions at home: Eating and drinking Follow an eating plan as told by your provider. You may need to avoid certain foods and drinks. These may include: Coffee and tea, with or without caffeine. Alcohol. Energy drinks and sports drinks. Fizzy drinks or sodas. Chocolate and cocoa. Peppermint and mint flavorings. Garlic and onions. Horseradish. Spicy and acidic foods. These include: Peppers. Chili powder and curry powder. Vinegar. Hot sauces and BBQ sauce. Citrus fruits and juices. These include: Oranges. Lemons. Limes. Tomato-based foods. These include: Red sauce and pizza with red sauce. Chili. Salsa. Fried and fatty foods. These include: Donuts. [place] fries. Potato chips. High-fat dressings. High-fat meats. These include: Hot dogs and sausage. Rib eye steak. Ham and bacon. High-fat dairy items. These include: Whole milk. Butter. Cream cheese. Eat small meals often. Avoid eating big meals. Avoid drinking lots of liquid with your meals. Try not to eat meals during the 2-3 hours before bedtime. Try not to lie down right after you eat. Do not exercise right after you eat. Lifestyle  If you're overweight, lose an amount of weight that's healthy for you. Ask your provider about a safe weight loss goal. Do not smoke, vape, or use nicotine or tobacco. Wear  loose clothes. Do not wear things that are tight around your waist. When you sleep, try: Raising the head of your bed about 6 inches (15 cm). You can use a wedge to do this. Lying down on your left side. Try to lower your stress. If you need help doing  this, ask your provider. General instructions Take your medicines only as told. Do not take aspirin or ibuprofen unless you're told to. Watch for any changes in your symptoms. Do not bend over if it makes your symptoms worse. Contact a health care provider if: You have new symptoms. You have trouble: Drinking. Swallowing. Eating. It hurts to swallow. You have wheezing. You have a cough that won't go away. Your voice is hoarse. Your symptoms don't get better with treatment. Get help right away if: You have pain all of a sudden in your: Arm. Neck. Jaw. Teeth. Back. You feel sweaty, dizzy, or light-headed all of a sudden. You faint. You have chest pain or shortness of breath. You vomit and the vomit is: Green, yellow, or black. Looks like blood or coffee grounds. Your poop is red, bloody, or black. These symptoms may be an emergency. Call 911 right away. Do not wait to see if the symptoms will go away. Do not drive yourself to the hospital. This information is not intended to replace advice given to you by your health care provider. Make sure you discuss any questions you have with your health care provider. Document Revised: 05/08/2023 Document Reviewed: 11/22/2022 [employer] Patient Education  2024 [employer].

## 2024-05-06 NOTE — Progress Notes (Signed)
 [facility] Cancer Follow up:    Doris Mustard, FNP [address] [place] [address]   DIAGNOSIS: Cancer Staging  Malignant neoplasm of upper-outer quadrant of right breast in female, estrogen receptor positive (HCC) Staging form: Breast, AJCC 8th Edition - Pathologic: Stage IB (pT3, pN82mi, cM0, G2, ER+, PR+, HER2-) - Signed by Crawford Morna Pickle, NP on 10/02/2018 Multigene prognostic tests performed: MammaPrint Histologic grading system: 3 grade system - Clinical stage from 12/28/2022: Stage IV (rcT2, rcN0, rcM1, G3, ER+, PR+, HER2-) - Signed by Loretha Ash, MD on 12/28/2022 Stage prefix: Recurrence Histologic grading system: 3 grade system Laterality: Right Staged by: Pathologist and managing physician Stage used in treatment planning: Yes National guidelines used in treatment planning: Yes Type of national guideline used in treatment planning: NCCN    SUMMARY OF ONCOLOGIC HISTORY: Oncology History  Malignant neoplasm of upper-outer quadrant of right breast in female, estrogen receptor positive (HCC)  08/08/2018 Initial Diagnosis   status post right breast biopsy 08/02/2018 for a clinical T3 N0, stage IIA invasive lobular carcinoma, grade 2, estrogen receptor strongly positive, progesterone receptor 1% positive, with no HER-2 amplification and an MIB-1 of 1%.             (a) CT scan of the head and chest, without contrast 08/23/2018 showed nonspecific 0.4 cm left lower lobe pulmonary nodule, no definitive metastatic disease             (b) bone scan 08/23/2018 shows multiple spinal areas of abnormal uptake, but             (c) total spinal MRI 09/03/2018 finds bone scan findings to be secondary to degenerative disease, no evidence of metastatic disease.   08/2018 - 10/2022 Anti-estrogen oral therapy   Anastrozole ; discontinued 10/14/2018 in preparation for chemotherapy, resumed October 2020   09/16/2018 Surgery   Right mastectomy Jeoffrey)  [id number]): Invasive Lobular Carcinoma, 10.5 cm, grade 2, negative margins. 1 of 5 lymph nodes positive for carcinoma.   09/16/2018 Miscellaneous   MammaPrint high risk suggests a 5-year metastasis free survival of 93% with chemotherapy, with an absolute chemotherapy benefit in the greater than 12% range    09/16/2018 Miscellaneous   Caris testing on mastectomy sample (09/16/2018) showed stable MSI and proficient mismatch repair status, with a low mutational burden; BRCA 1 and 2 were not mutated, PI K3 was not mutated, ER B B2 was not mutated, and AKT 1 was not mutated. 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 Administration: 0.25 mg (11/05/2018), 0.25 mg (11/26/2018), 0.25 mg (12/17/2018), 0.25 mg (01/07/2019), 0.25 mg (01/28/2019), 0.25 mg (02/18/2019), 0.25 mg (03/11/2019), 0.25 mg (04/02/2019)  methotrexate  (PF) chemo injection 84 mg, 39.8 mg/m2 = 84.5 mg, Intravenous,  Once, 5 of 5 cycles. Administration: 84 mg (11/05/2018), 84 mg (11/26/2018), 84 mg (12/17/2018), 84 mg (03/11/2019), 84 mg (04/02/2019)  pegfilgrastim -cbqv (UDENYCA ) injection 6 mg, 6 mg, Subcutaneous, Once, 6 of 6 cycles. Administration: 6 mg (12/19/2018)  cyclophosphamide  (CYTOXAN ) 1,260 mg in sodium chloride  0.9 % 250 mL chemo infusion, 600 mg/m2 = 1,260 mg, Intravenous,  Once, 8 of 8 cycle. Administration: 1,260 mg (11/05/2018), 1,260 mg (11/26/2018), 1,260 mg (12/17/2018), 1,260 mg (01/07/2019), 1,260 mg (01/28/2019), 1,260 mg (02/18/2019), 1,260 mg (03/11/2019), 1,260 mg (04/02/2019)  fluorouracil  (ADRUCIL ) chemo injection 1,250  mg, 600 mg/m2 = 1,250 mg, Intravenous,  Once, 8 of 8 cycles. Administration: 1,250 mg (11/05/2018), 1,250 mg (11/26/2018), 1,250 mg (12/17/2018), 1,250 mg (01/07/2019), 1,250 mg (01/28/2019), 1,250 mg (02/18/2019), 1,250 mg (03/11/2019), 1,250 mg  (04/02/2019).    12/31/2018 - 02/19/2019 Radiation Therapy   The patient initially received a dose of 50.4 Gy in 28 fractions to the chest wall and supraclavicular region. This was delivered using a 3-D conformal, 4 field technique. The patient then received a boost to the mastectomy scar. This delivered an additional 10 Gy in 5 fractions using an en face electron field. The total dose was 60.4 Gy.   09/12/2022 Imaging   Bone scan on 09/12/2022 shows uptake in ribs, manubrium. Widespread osseous metastasis confirmed on PET scan that was completed on October 06, 2022. Right iliac crest biopsy demonstrated metastatic carcinoma consistent with patient's known breast carcinoma. ER 90% positive, PR 90% positive, Ki67 10%, HER2 negative.    10/10/2022 Treatment Plan Change   Faslodex  beginning 10/10/2022; Ibrance  beginning 12/20/2022; Zometa  every 12 weeks 11/07/2022    10/23/2022 - 11/03/2022 Radiation Therapy   Palliative Radiation 10/23/2022-11/03/2022:  left chest wall, lower T spine, and right proximal hip/pelvis were each treated to 30 GY in 10 fractions.      CURRENT THERAPY: Faslodex , Elacestrant, Zometa  (every 12 weeks)  INTERVAL HISTORY:  Discussed the use of AI scribe software for clinical note transcription with the patient, who gave verbal consent to proceed.  History of Present Illness Doris Smith is a 73 year old female with metastatic breast cancer who presents with worsening symptoms and concerns about her current treatment regimen.  She is undergoing treatment with elacestrant and Faslodex , having switched from Verzenio  on April 10, 2024. Her last doses of Zometa  and Faslodex  were on April 15, 2024, and April 10, 2024, respectively. She experiences nausea, severe acid reflux, and headaches, which began before starting elacestrant. Recent procedures include the removal of 500 cc of fluid from her left lung.  She has lost four pounds despite eating and is unable to consume full  meals. Fluid intake is inadequate, and she did not feel improvement after receiving fluids and blood work during a recent emergency room visit. A CT scan revealed a 5 cm lesion in her liver and several smaller lesions.  She takes medication for acid reflux twice daily and uses Miralax for bowel management. She feels physically and mentally exhausted and is not resting well.     Patient Active Problem List   Diagnosis Date Noted   Malnutrition of moderate degree 08/17/2023   GAVE (gastric antral vascular ectasia) 08/17/2023   Gastric erosion 08/17/2023   Melena 08/16/2023   Anemia associated with acute blood loss 08/16/2023   Peripheral edema 12/29/2022   Restless leg syndrome 10/03/2021   Anxiety associated with depression 06/10/2020   Port-A-Cath in place 11/12/2018   Malignant neoplasm of upper-outer quadrant of right breast in female, estrogen receptor positive (HCC) 08/08/2018   Right knee pain 03/01/2015   Aortic atherosclerosis 12/28/2014   Obesity (BMI 30-39.9) 04/09/2014   Insomnia due to stress 04/09/2014   Atrial fibrillation (HCC) 10/01/2013   GERD (gastroesophageal reflux disease) 10/01/2013   Bipolar disorder (HCC) 09/27/2007   Essential hypertension 09/27/2007   ALLERGIC RHINITIS 09/27/2007   IRRITABLE BOWEL SYNDROME 09/27/2007   Arthropathy 09/27/2007   DEGENERATIVE DISC DISEASE, LUMBOSACRAL SPINE 09/27/2007    is allergic to iohexol, codeine, palonosetron , prednisone , sulfonamide derivatives, celecoxib, sulfa antibiotics, and vitamin b12.  MEDICAL  HISTORY: Past Medical History:  Diagnosis Date   A-fib (HCC) 11/2012   PAF   Anxiety    takes Ativan    Asthma 04/07/2011   dx   Bipolar disorder (HCC)    Cancer (HCC)    Right breast   Depression    Early cataracts, bilateral    Fatty liver    Fibromyalgia    GERD (gastroesophageal reflux disease)    Irritable bowel syndrome    Mental disorder    dx bipolar   PONV (postoperative nausea and vomiting)     Sleep apnea 04/2011   does not wear CPAP   Tardive dyskinesia     SURGICAL HISTORY: Past Surgical History:  Procedure Laterality Date   ABDOMINAL HYSTERECTOMY     BIOPSY  08/17/2023   Procedure: BIOPSY;  Surgeon: Avram Lupita BRAVO, MD;  Location: [facility];  Service: Gastroenterology;;   COLONOSCOPY     DILATION AND CURETTAGE OF UTERUS  1981   abnormal pap   ESOPHAGOGASTRODUODENOSCOPY (EGD) WITH PROPOFOL  N/A 08/17/2023   Procedure: ESOPHAGOGASTRODUODENOSCOPY (EGD) WITH PROPOFOL ;  Surgeon: Avram Lupita BRAVO, MD;  Location: [facility];  Service: Gastroenterology;  Laterality: N/A;   HOT HEMOSTASIS N/A 08/17/2023   Procedure: HOT HEMOSTASIS (ARGON PLASMA COAGULATION/BICAP);  Surgeon: Avram Lupita BRAVO, MD;  Location: [facility];  Service: Gastroenterology;  Laterality: N/A;   IR IMAGING GUIDED PORT INSERTION  11/20/2023   IR THORACENTESIS ASP PLEURAL SPACE W/IMG GUIDE  09/13/2023   IR THORACENTESIS ASP PLEURAL SPACE W/IMG GUIDE  11/20/2023   IR THORACENTESIS ASP PLEURAL SPACE W/IMG GUIDE  03/20/2024   KNEE ARTHROSCOPY Left 2007   x 2   LUMBAR LAMINECTOMY/DECOMPRESSION MICRODISCECTOMY  05/31/2011   Procedure: LUMBAR LAMINECTOMY/DECOMPRESSION MICRODISCECTOMY;  Surgeon: Reyes JAYSON Billing;  Location: [facility];  Service: Orthopedics;  Laterality: Left;  Decompression Lumbar four to five and  Lumbar five to Sacral one on Left  (X-Ray)   MASTECTOMY W/ SENTINEL NODE BIOPSY Right 09/16/2018   Procedure: RIGHT MASTECTOMY WITH RIGHT AXILLARY SENTINEL LYMPH NODE BIOPSY;  Surgeon: Ethyl Lenis, MD;  Location: [facility];  Service: General;  Laterality: Right;   PARTIAL HYSTERECTOMY  1982   PORTACATH PLACEMENT Left 10/31/2018   Procedure: INSERTION PORT-A-CATH WITH ULTRASOUND;  Surgeon: Ethyl Lenis, MD;  Location: [facility];  Service: General;  Laterality: Left;   TONSILLECTOMY     as child   TOTAL KNEE ARTHROPLASTY Left 12/18/2005    SOCIAL HISTORY: Social History   Socioeconomic History    Marital status: Married    Spouse name: Lynwood   Number of children: 2   Years of education: 12   Highest education level: Not on file  Occupational History   Occupation: Disabled  Tobacco Use   Smoking status: Never   Smokeless tobacco: Never  Vaping Use   Vaping status: Never Used  Substance and Sexual Activity   Alcohol use: No   Drug use: No   Sexual activity: Yes    Birth control/protection: Post-menopausal, Surgical  Other Topics Concern   Not on file  Social History Narrative   Tea daily.  Rarely has caffeine    Social Drivers of Corporate Investment Banker Strain: Patient Declined (10/22/2023)   Overall Financial Resource Strain (CARDIA)    Difficulty of Paying Living Expenses: Patient declined  Food Insecurity: Patient Declined (10/22/2023)   Hunger Vital Sign    Worried About Running Out of Food in the Last Year: Patient declined    Barista  in the Last Year: Patient declined  Transportation Needs: No Transportation Needs (10/22/2023)   PRAPARE - Administrator, Civil Service (Medical): No    Lack of Transportation (Non-Medical): No  Physical Activity: Unknown (10/22/2023)   Exercise Vital Sign    Days of Exercise per Week: 0 days    Minutes of Exercise per Session: Not on file  Stress: Stress Concern Present (10/22/2023)   [employer] of Occupational Health - Occupational Stress Questionnaire    Feeling of Stress : To some extent  Social Connections: Unknown (10/22/2023)   Social Connection and Isolation Panel    Frequency of Communication with Friends and Family: More than three times a week    Frequency of Social Gatherings with Friends and Family: Twice a week    Attends Religious Services: Patient declined    Database Administrator or Organizations: No    Attends Banker Meetings: Never    Marital Status: Married  Catering Manager Violence: Not At Risk (08/16/2023)   Humiliation, Afraid, Rape, and Kick questionnaire     Fear of Current or Ex-Partner: No    Emotionally Abused: No    Physically Abused: No    Sexually Abused: No    FAMILY HISTORY: Family History  Problem Relation Age of Onset   Anesthesia problems Mother    Heart disease Mother        CHF, atrial fib   Heart failure Mother    Anesthesia problems Sister    Colon polyps Father    Heart disease Father        Fluid around the heart   Hypertension Sister    Breast cancer Maternal Aunt    Colon cancer Maternal Aunt    Prostate cancer Neg Hx    Ovarian cancer Neg Hx     Review of Systems  Constitutional:  Negative for appetite change, chills, fatigue, fever and unexpected weight change.  HENT:   Negative for hearing loss, lump/mass and trouble swallowing.   Eyes:  Negative for eye problems and icterus.  Respiratory:  Negative for chest tightness, cough and shortness of breath.   Cardiovascular:  Negative for chest pain, leg swelling and palpitations.  Gastrointestinal:  Negative for abdominal distention, abdominal pain, constipation, diarrhea, nausea and vomiting.  Endocrine: Negative for hot flashes.  Genitourinary:  Negative for difficulty urinating.   Musculoskeletal:  Negative for arthralgias.  Skin:  Negative for itching and rash.  Neurological:  Negative for dizziness, extremity weakness, headaches and numbness.  Hematological:  Negative for adenopathy. Does not bruise/bleed easily.  Psychiatric/Behavioral:  Negative for depression. The patient is not nervous/anxious.       PHYSICAL EXAMINATION    Vitals:   05/06/24 1106  BP: 130/65  Pulse: 84  Resp: 15  SpO2: 100%    Physical Exam Constitutional:      General: She is not in acute distress.    Appearance: Normal appearance. She is not toxic-appearing.  HENT:     Head: Normocephalic and atraumatic.     Mouth/Throat:     Mouth: Mucous membranes are moist.     Pharynx: Oropharynx is clear. No oropharyngeal exudate or posterior oropharyngeal erythema.  Eyes:      General: No scleral icterus. Cardiovascular:     Rate and Rhythm: Normal rate and regular rhythm.     Pulses: Normal pulses.     Heart sounds: Normal heart sounds.  Pulmonary:     Effort: Pulmonary effort is normal.  Breath sounds: Normal breath sounds.  Abdominal:     General: Abdomen is flat. Bowel sounds are normal. There is no distension.     Palpations: Abdomen is soft.     Tenderness: There is no abdominal tenderness.  Musculoskeletal:        General: No swelling.     Cervical back: Neck supple.  Lymphadenopathy:     Cervical: No cervical adenopathy.  Skin:    General: Skin is warm and dry.     Findings: No rash.  Neurological:     General: No focal deficit present.     Mental Status: She is alert.  Psychiatric:        Mood and Affect: Mood normal.        Behavior: Behavior normal.     LABORATORY DATA:  CBC    Component Value Date/Time   WBC 2.8 (L) 04/28/2024 1436   RBC 3.00 (L) 04/28/2024 1436   HGB 9.6 (L) 04/28/2024 1436   HGB 7.0 (L) 04/22/2024 1115   HGB 12.0 06/05/2022 1024   HCT 30.4 (L) 04/28/2024 1436   HCT 37.4 06/05/2022 1024   PLT 66 (L) 04/28/2024 1436   PLT 98 (L) 04/22/2024 1115   PLT 251 06/05/2022 1024   MCV 101.3 (H) 04/28/2024 1436   MCV 86 06/05/2022 1024   MCH 32.0 04/28/2024 1436   MCHC 31.6 04/28/2024 1436   RDW 16.5 (H) 04/28/2024 1436   RDW 15.5 (H) 06/05/2022 1024   LYMPHSABS 0.3 (L) 04/22/2024 1115   LYMPHSABS 0.9 06/05/2022 1024   MONOABS 0.2 04/22/2024 1115   EOSABS 0.1 04/22/2024 1115   EOSABS 0.1 06/05/2022 1024   BASOSABS 0.0 04/22/2024 1115   BASOSABS 0.1 06/05/2022 1024    CMP     Component Value Date/Time   NA 141 04/28/2024 1436   NA 145 (H) 06/05/2022 1024   K 3.9 04/28/2024 1436   CL 106 04/28/2024 1436   CO2 26 04/28/2024 1436   GLUCOSE 112 (H) 04/28/2024 1436   BUN 15 04/28/2024 1436   BUN 17 06/05/2022 1024   CREATININE 0.88 04/28/2024 1436   CREATININE 0.86 04/22/2024 1115   CALCIUM 8.1  (L) 04/28/2024 1436   PROT 5.5 (L) 04/28/2024 1436   PROT 6.6 06/05/2022 1024   ALBUMIN 3.3 (L) 04/28/2024 1436   ALBUMIN 4.0 06/05/2022 1024   AST 88 (H) 04/28/2024 1436   AST 62 (H) 04/22/2024 1115   ALT 30 04/28/2024 1436   ALT 31 04/22/2024 1115   ALKPHOS 205 (H) 04/28/2024 1436   BILITOT 0.3 04/28/2024 1436   BILITOT 0.4 04/22/2024 1115   GFRNONAA >60 04/28/2024 1436   GFRNONAA >60 04/22/2024 1115   GFRAA 81 02/03/2020 1049   GFRAA >60 12/19/2018 1434     ASSESSMENT and THERAPY PLAN:   No problem-specific Assessment & Plan notes found for this encounter. Assessment and Plan Assessment & Plan Metastatic breast cancer with liver involvement and malignant pleural effusion Metastatic breast cancer with liver involvement, including a 5 cm lesion and several smaller lesions. Malignant pleural effusion with recent removal of 500 cc of fluid from the left lung. Current treatment includes elacestrant, Faslodex , and Zometa . Elacestrant was initiated due to a mutation allowing cancer to bypass previous therapy. - Continue elacestrant, Faslodex , and Zometa  as per current regimen. - Monitor response to elacestrant and adjust treatment as necessary. - RTC in 4 weeks for labs, f/u, and Faslodex .    Nausea, decreased appetite, and fatigue secondary to malignancy and  therapy Nausea, decreased appetite, and fatigue are likely multifactorial, related to both the cancer's liver involvement and the side effects of current cancer therapies. - Administer IV fluids to address potential dehydration contributing to symptoms. - Administer GI cocktail to see if it helps with her GI symptoms.    Headache Persistent headache since the emergency room visit, possibly exacerbated by Zofran . - Evaluate headache in the context of current medications and cancer treatment.  Gastroesophageal reflux disease (GERD) GERD symptoms have been exacerbated, with severe acid reflux reported. - Continue current GERD  management regimen.  Dehydration Dehydration suspected due to inadequate fluid intake and recent symptoms. - Administer IV fluids to address dehydration. - Encourage increased oral fluid intake as tolerated.  Constipation, improved with Miralax Constipation is currently well-managed with Miralax. - Continue Miralax as needed to maintain regular bowel movements.  Goals of Care She expressed significant distress about her cancer diagnosis and treatment, desiring to live and not wanting to die. She is focused on quality of life and managing symptoms effectively.  Do Not Resuscitate (DNR) status She has expressed a desire for DNR status and does not wish to undergo resuscitation efforts. - Complete and sign DNR form to ensure her wishes are respected.    All questions were answered. The patient knows to call the clinic with any problems, questions or concerns. We can certainly see the patient much sooner if necessary.  Total encounter time:45 minutes*in face-to-face visit time, chart review, lab review, care coordination, order entry, and documentation of the encounter time.    Morna Kendall, NP 05/06/24 11:27 AM Medical Oncology and Hematology [facility] [address] [place], [place] [address] Tel. [phone]    Fax. [phone]  *Total Encounter Time as defined by the Centers for Medicare and Medicaid Services includes, in addition to the face-to-face time of a patient visit (documented in the note above) non-face-to-face time: obtaining and reviewing outside history, ordering and reviewing medications, tests or procedures, care coordination (communications with other health care professionals or caregivers) and documentation in the medical record.

## 2024-05-07 ENCOUNTER — Encounter: Payer: Self-pay | Admitting: Nurse Practitioner

## 2024-05-07 ENCOUNTER — Inpatient Hospital Stay (HOSPITAL_BASED_OUTPATIENT_CLINIC_OR_DEPARTMENT_OTHER): Admitting: Nurse Practitioner

## 2024-05-07 VITALS — BP 113/56 | HR 85 | Temp 97.6°F | Resp 16 | Ht 64.0 in

## 2024-05-07 DIAGNOSIS — R1013 Epigastric pain: Secondary | ICD-10-CM

## 2024-05-07 DIAGNOSIS — R630 Anorexia: Secondary | ICD-10-CM

## 2024-05-07 DIAGNOSIS — Z515 Encounter for palliative care: Secondary | ICD-10-CM | POA: Diagnosis not present

## 2024-05-07 DIAGNOSIS — G893 Neoplasm related pain (acute) (chronic): Secondary | ICD-10-CM

## 2024-05-07 DIAGNOSIS — R530 Neoplastic (malignant) related fatigue: Secondary | ICD-10-CM

## 2024-05-07 DIAGNOSIS — R634 Abnormal weight loss: Secondary | ICD-10-CM

## 2024-05-07 NOTE — Progress Notes (Signed)
 Palliative Medicine [facility]  [phone] [phone]   Name: Doris Smith Date: 05/07/2024 MRN: [id number]  DOB: [birth year]  Patient Care Team: [name], FNP as PCP - General (Family Medicine) Jerilynn Lamarr HERO, NP as PCP - Cardiology (Nurse Practitioner) Ethyl Lenis, MD as Consulting Physician (General Surgery) Dewey Rush, MD as Consulting Physician (Radiation Oncology) Laymon Soulier, MD as Consulting Physician (Pulmonary Disease) Kay Kemps, MD as Consulting Physician (Orthopedic Surgery) Duwayne Purchase, MD as Consulting Physician (Orthopedic Surgery) Kallie Medici, NP as Nurse Practitioner (Adult Health Nurse Practitioner) Tyree Nanetta SAILOR, RN as Oncology Nurse Navigator Arelia Filippo, MD as Consulting Physician (Plastic Surgery) Lavona Agent, MD as Consulting Physician (Cardiology) Loretha Ash, MD as Medical Oncologist (Hematology and Oncology) Pickenpack-Cousar, Fannie SAILOR, NP as Nurse Practitioner (Hospice and Palliative Medicine)   INTERVAL HISTORY: Doris Smith is a 73 y.o. female with oncologic medical history including estrogen receptor positive breast cancer(07/2018) s/p right mastectomy. PET scan 10/06/22 showing widespread osseous metastasis. Other pertinent history includes atrial fibrillation, asthma, OSA, GERD, fatty liver, fibromyalgia, osteoporosis, chronic headaches, anxiety, depression, and bipolar disorder. Underwent ORIF of right hip 09/20/2022. Palliative ask to see for symptom and pain management and goals of care.   SOCIAL HISTORY:     reports that she has never smoked. She has never used smokeless tobacco. She reports that she does not drink alcohol and does not use drugs.  ADVANCE DIRECTIVES:  On file  CODE STATUS: Full code  PAST MEDICAL HISTORY: Past Medical History:  Diagnosis Date   A-fib (HCC) 11/2012   PAF   Anxiety    takes Ativan    Asthma 04/07/2011    dx   Bipolar disorder (HCC)    Cancer (HCC)    Right breast   Depression    Early cataracts, bilateral    Fatty liver    Fibromyalgia    GERD (gastroesophageal reflux disease)    Irritable bowel syndrome    Mental disorder    dx bipolar   PONV (postoperative nausea and vomiting)    Sleep apnea 04/2011   does not wear CPAP   Tardive dyskinesia     ALLERGIES:  is allergic to iohexol, codeine, palonosetron , prednisone , sulfonamide derivatives, celecoxib, sulfa antibiotics, and vitamin b12.  MEDICATIONS:  Current Outpatient Medications  Medication Sig Dispense Refill   cyanocobalamin  1000 MCG tablet Take 1 tablet (1,000 mcg total) by mouth daily. 60 tablet 0   elacestrant hydrochloride (ORSERDU) 345 MG tablet Take 1 tablet (345 mg total) by mouth daily. Take with food. (Patient not taking: Reported on 04/15/2024) 30 tablet 1   Incontinence Supply Disposable (CERTAINTY FITTED BRIEFS XL) MISC 1 Package by Does not apply route as needed. 56 each 3   lidocaine  (LIDODERM ) 5 % Place 2 patches onto the skin daily. Remove & Discard patch within 12 hours or as directed by MD 60 patch 2   lidocaine -prilocaine  (EMLA ) cream Apply 1 Application topically as needed. Apply to port site 30-45 min prior to appointment. 30 g 3   LORazepam  (ATIVAN ) 1 MG tablet Take 1 tablet (1 mg total) by mouth 2 (two) times daily as needed for anxiety. 60 tablet 5   magic mouthwash (nystatin , diphenhydrAMINE , alum & mag hydroxide) suspension mixture Swish and swallow 5 mLs three times daily 150 mL 1   OLANZapine  (ZYPREXA ) 5 MG tablet Take 1 tablet (5 mg total) by mouth at bedtime. 30 tablet 0   ondansetron  (ZOFRAN ) 8 MG tablet TAKE  1 TABLET BY MOUTH EVERY 8 HOURS AS NEEDED FOR NAUSEA FOR VOMITING 30 tablet 0   oxyCODONE  (OXYCONTIN ) 15 mg 12 hr tablet Take 1 tablet (15 mg total) by mouth every 12 (twelve) hours. 60 tablet 0   oxyCODONE -acetaminophen  (PERCOCET) 10-325 MG tablet Take 1 tablet by mouth every 4 (four) hours as  needed for pain. 60 tablet 0   pantoprazole  (PROTONIX ) 40 MG tablet Take 1 tablet (40 mg total) by mouth 2 (two) times daily before a meal. 180 tablet 1   polyethylene glycol (MIRALAX / GLYCOLAX) 17 g packet Take 17 g by mouth daily.     pramipexole  (MIRAPEX ) 1 MG tablet TAKE 1 TABLET BY MOUTH THREE TIMES DAILY 90 tablet 1   prochlorperazine  (COMPAZINE ) 10 MG tablet Take 1 tablet (10 mg total) by mouth every 6 (six) hours as needed for nausea or vomiting. 30 tablet 2   sennosides-docusate sodium  (SENOKOT-S) 8.6-50 MG tablet Take 1 tablet by mouth daily.     No current facility-administered medications for this visit.    VITAL SIGNS: BP (!) 113/56 (BP Location: Left Arm, Patient Position: Sitting) Comment: nurse is aware  Pulse 85   Temp 97.6 F (36.4 C)   Resp 16   Ht 5' 4 (1.626 m)   SpO2 100%   BMI 24.01 kg/m  There were no vitals filed for this visit.    Estimated body mass index is 24.01 kg/m as calculated from the following:   Height as of this encounter: 5' 4 (1.626 m).   Weight as of 05/06/24: 139 lb 14.4 oz (63.5 kg).  Assessment  NAD RRR Normal breathing pattern AAO x3  PERFORMANCE STATUS (ECOG) : 1 - Symptomatic but completely ambulatory  IMPRESSION: Discussed the use of AI scribe software for clinical note transcription with the patient, who gave verbal consent to proceed.  History of Present Illness Doris Smith is a 73 year old female with cancer who presented to clinic for follow-up. She is accompanied by her husband. Patient seen yesterday in [facility] due to concerns of ongoing nausea. Patient denies nausea and reports some improvement today however still not feeling back to normal!   Thoracentesis completed 05/06/2024 yielding . Patient reports some relief. She experienced significant pain during the fluid drainage of 500 mL yesterday, which was not previously painful. The pain is exacerbated by deep breaths, though it has improved today  compared to yesterday. Recommended continuing to watch.   [name] describes a sensation of plaque on her tongue upon waking, which she removes with a Kleenex, often leading to dry heaves. She uses mouthwash afterward, which she finds effective. Patient prescribed magic mouthwash on last week however has not used consistently until over the past 24 hours. Reports improvement in indigestion and dry heaves.   She experiences indigestion and is currently taking Protonix  twice a day. She drinks Crystal Light lemonade without issues but prefers a variety of beverages to avoid monotony.  She has lost about four pounds recently and struggles with consuming nutritional drinks like Boost, which now make her feel nauseous. She has not tried Ensure but is hesitant due to similar concerns. She attempted to drink Premier Protein shakes but disliked the flavors. Encouraged to mix with ice cream which she enjoys.   We will continue to close monitoring and support.   Goals of Care Mrs. Baldini is emotional expressing her willingness to live. She states clearly her goal is to continue to treat the treatable allow her every opportunity  to continue to thrive.  She speaks to her wishes of returning back to her home which is currently under renovations.  Patient states she is excepting a passing away when God calls her home however she is hopeful that this time is not near.  Emotional support provided.  Patient and family continue to take things one day at a time.  11/30/22: Mrs. Trainer is emotional. Her husband becomes emotional as patient shares she knows her health is not the best and her cancer is not curable. She is taking life one day at time. Speaks to when her time comes she will be ready but hopeful it is not now. She knows that no one has a definitive time frame. Expresses her appreciation in all of the care and support. Expresses her goal is to continue to treat the treatable allowing her the ability to continue to thrive  while not suffering.    4/30: Since the night of 10/30/22, Mrs. Bedore has continued to meet goal of lying in her own bed at night beside her husband. She expresses appreciation for the care of everyone at the cancer center and is hopeful for continued improvement.  I discussed the importance of continued conversation with family and their medical providers regarding overall plan of care and treatment options, ensuring decisions are within the context of the patients values and GOCs. Assessment & Plan Malignant neoplasm of bone with chronic neoplasm-related pain Chronic pain related to malignant neoplasm of bone, with a notable knot in the middle of the back causing throbbing and radiating pain. Current pain management includes oxycodone  and oxycontin , with adjustments made to optimize relief. - Continue oxycodone  and oxycontin  for pain management. - Refill medications as requested  Unintentional weight loss Has lost four more pounds despite maintaining food intake. Difficulty tolerating nutritional supplements like Boost due to nausea and vomiting. Tried Premier Protein but did not tolerate the flavors well. - Encourage trial of different flavors of Premier Protein, possibly mixed with ice cream to improve palatability.  Oral plaque with dry heaves Reports plaque on tongue in the morning leading to dry heaves. Using mouthwash effectively to manage symptoms. - Continue using mouthwash, especially before bed, to manage oral plaque and associated dry heaves.  Indigestion Experiencing indigestion, possibly exacerbated by certain drinks. Currently taking Protonix  twice daily. Consuming Crystal Light lemonade without issues. - Consider trying sparkling water as an alternative beverage to help with indigestion.  I will plan to see patient back in 2-3 weeks. Sooner if needed.   Patient expressed understanding and was in agreement with this plan. She also understands that She can call the clinic at any  time with any questions, concerns, or complaints.   Any controlled substances utilized were prescribed in the context of palliative care. PDMP has been reviewed.   Visit consisted of counseling and education dealing with the complex and emotionally intense issues of symptom management and palliative care in the setting of serious and potentially life-threatening illness.  Levon Borer, AGPCNP-BC  Palliative Medicine Team/[facility]   bloo

## 2024-05-08 ENCOUNTER — Telehealth: Payer: Self-pay

## 2024-05-08 ENCOUNTER — Encounter: Payer: Self-pay | Admitting: Hematology and Oncology

## 2024-05-08 NOTE — Telephone Encounter (Signed)
 Pt called to verify schedule, all questions answered, no further needs at this time.

## 2024-05-09 ENCOUNTER — Inpatient Hospital Stay

## 2024-05-09 VITALS — BP 116/52 | HR 74 | Temp 98.3°F | Resp 17

## 2024-05-09 DIAGNOSIS — Z5111 Encounter for antineoplastic chemotherapy: Secondary | ICD-10-CM | POA: Diagnosis not present

## 2024-05-09 DIAGNOSIS — M810 Age-related osteoporosis without current pathological fracture: Secondary | ICD-10-CM | POA: Diagnosis not present

## 2024-05-09 DIAGNOSIS — Z170 Estrogen receptor positive status [ER+]: Secondary | ICD-10-CM

## 2024-05-09 MED ORDER — FULVESTRANT 250 MG/5ML IM SOSY
500.0000 mg | PREFILLED_SYRINGE | Freq: Once | INTRAMUSCULAR | Status: AC
  Administered 2024-05-09: 500 mg via INTRAMUSCULAR
  Filled 2024-05-09: qty 10

## 2024-05-16 ENCOUNTER — Encounter: Payer: Self-pay | Admitting: Nurse Practitioner

## 2024-05-16 ENCOUNTER — Ambulatory Visit (INDEPENDENT_AMBULATORY_CARE_PROVIDER_SITE_OTHER): Admitting: Nurse Practitioner

## 2024-05-16 VITALS — BP 109/61 | HR 79 | Temp 97.2°F | Ht 64.0 in | Wt 139.0 lb

## 2024-05-16 DIAGNOSIS — R519 Headache, unspecified: Secondary | ICD-10-CM

## 2024-05-16 DIAGNOSIS — Z09 Encounter for follow-up examination after completed treatment for conditions other than malignant neoplasm: Secondary | ICD-10-CM

## 2024-05-16 DIAGNOSIS — G2581 Restless legs syndrome: Secondary | ICD-10-CM

## 2024-05-16 MED ORDER — PRAMIPEXOLE DIHYDROCHLORIDE 1 MG PO TABS: 1.0000 mg | ORAL_TABLET | Freq: Three times a day (TID) | ORAL | 1 refills | Status: DC

## 2024-05-16 NOTE — Progress Notes (Signed)
 Subjective:    Patient ID: Doris Smith, female    DOB: Jun 29, 1951, 73 y.o.   MRN: [id number]   Chief Complaint: hospital follow up  HPI  Patient went to ED  on 04/28/24 with bad headache. She is currently under treatment for malignant bone cancer. Was treated in ED with Tylenol , magnesium , LR, Ativan , Benadryl  and Reglan. Scans were negative for any brain abnormaity. Since going home she still has headaches on and off but tylenol  and motrin  help.  Needs refill of mirapex . Wt Readings from Last 3 Encounters:  05/16/24 139 lb (63 kg)  05/06/24 139 lb 14.4 oz (63.5 kg)  04/23/24 143 lb 3.2 oz (65 kg)   BMI Readings from Last 3 Encounters:  05/16/24 23.86 kg/m  05/07/24 24.01 kg/m  05/06/24 24.01 kg/m    Patient Active Problem List   Diagnosis Date Noted   Malnutrition of moderate degree 08/17/2023   GAVE (gastric antral vascular ectasia) 08/17/2023   Gastric erosion 08/17/2023   Melena 08/16/2023   Anemia associated with acute blood loss 08/16/2023   Peripheral edema 12/29/2022   Restless leg syndrome 10/03/2021   Anxiety associated with depression 06/10/2020   Port-A-Cath in place 11/12/2018   Malignant neoplasm of upper-outer quadrant of right breast in female, estrogen receptor positive (HCC) 08/08/2018   Right knee pain 03/01/2015   Aortic atherosclerosis 12/28/2014   Obesity (BMI 30-39.9) 04/09/2014   Insomnia due to stress 04/09/2014   Atrial fibrillation (HCC) 10/01/2013   GERD (gastroesophageal reflux disease) 10/01/2013   Bipolar disorder (HCC) 09/27/2007   Essential hypertension 09/27/2007   ALLERGIC RHINITIS 09/27/2007   IRRITABLE BOWEL SYNDROME 09/27/2007   Arthropathy 09/27/2007   DEGENERATIVE DISC DISEASE, LUMBOSACRAL SPINE 09/27/2007       Review of Systems  Constitutional:  Negative for diaphoresis.  Eyes:  Negative for pain.  Respiratory:  Negative for shortness of breath.   Cardiovascular:  Negative for chest pain, palpitations and leg  swelling.  Gastrointestinal:  Negative for abdominal pain.  Endocrine: Negative for polydipsia.  Skin:  Negative for rash.  Neurological:  Negative for dizziness, weakness and headaches.  Hematological:  Does not bruise/bleed easily.  All other systems reviewed and are negative.      Objective:   Physical Exam Constitutional:      Appearance: Normal appearance.  Cardiovascular:     Rate and Rhythm: Normal rate and regular rhythm.     Heart sounds: Normal heart sounds.  Pulmonary:     Effort: Pulmonary effort is normal.     Breath sounds: Normal breath sounds.  Skin:    General: Skin is warm and dry.  Neurological:     General: No focal deficit present.     Mental Status: She is alert and oriented to person, place, and time.  Psychiatric:        Mood and Affect: Mood normal.        Behavior: Behavior normal.     BP 109/61   Pulse 79   Temp (!) 97.2 F (36.2 C) (Temporal)   Ht 5' 4 (1.626 m)   Wt 139 lb (63 kg)   SpO2 95%   BMI 23.86 kg/m        Assessment & Plan:   Doris Smith in today with chief complaint of Hospitalization Follow-up   1. Acute nonintractable headache, unspecified headache type (Primary) Continue tylenol  as needed Avoid caffeine RTO prn  2. Hospital discharge follow-up Hospital records reviewed  3. Restless leg syndrome Keep  legs warm at night - pramipexole  (MIRAPEX ) 1 MG tablet; Take 1 tablet (1 mg total) by mouth 3 (three) times daily.  Dispense: 90 tablet; Refill: 1    The above assessment and management plan was discussed with the patient. The patient verbalized understanding of and has agreed to the management plan. Patient is aware to call the clinic if symptoms persist or worsen. Patient is aware when to return to the clinic for a follow-up visit. Patient educated on when it is appropriate to go to the emergency department.   [name] Gladis, FNP

## 2024-05-16 NOTE — Patient Instructions (Signed)
 General Headache Without Cause A headache is pain or discomfort you feel around the head or neck area. There are many causes and types of headaches. In some cases, the cause may not be found. Follow these instructions at home: Watch your condition for any changes. Let your doctor know about them. Take these steps to help with your condition: Managing pain     Take over-the-counter and prescription medicines only as told by your doctor. This includes medicines for pain that are taken by mouth or put on the skin. Lie down in a dark, quiet room when you have a headache. If told, put ice on your head and neck area: Put ice in a plastic bag. Place a towel between your skin and the bag. Leave the ice on for 20 minutes, 2-3 times per day. Take off the ice if your skin turns bright red. This is very important. If you cannot feel pain, heat, or cold, you have a greater risk of damage to the area. If told, put heat on the affected area. Use the heat source that your doctor recommends, such as a moist heat pack or a heating pad. Place a towel between your skin and the heat source. Leave the heat on for 20-30 minutes. Take off the heat if your skin turns bright red. This is very important. If you cannot feel pain, heat, or cold, you have a greater risk of getting burned. Keep lights dim if bright lights bother you or make your headaches worse. Eating and drinking Eat meals on a regular schedule. If you drink alcohol: Limit how much you have to: 0-1 drink a day for women who are not pregnant. 0-2 drinks a day for men. Know how much alcohol is in a drink. In the [place] one drink equals one 12 oz bottle of beer (355 mL), one 5 oz glass of wine (148 mL), or one 1 oz glass of hard liquor (44 mL). Stop drinking caffeine, or drink less caffeine. General instructions  Keep a journal to find out if certain things bring on headaches. For example, write down: What you eat and drink. How much sleep you  get. Any change to your diet or medicines. Get a massage or try other ways to relax. Limit stress. Sit up straight. Do not tighten (tense) your muscles. Do not smoke or use any products that contain nicotine or tobacco. If you need help quitting, ask your doctor. Exercise regularly as told by your doctor. Get enough sleep. This often means 7-9 hours of sleep each night. Keep all follow-up visits. This is important. Contact a doctor if: Medicine does not help your symptoms. You have a headache that feels different than the other headaches. You feel like you may vomit (nauseous) or you vomit. You have a fever. Get help right away if: Your headache: Gets very bad quickly. Gets worse after a lot of physical activity. You have any of these symptoms: You continue to vomit. A stiff neck. Trouble seeing. Your eye or ear hurts. Trouble speaking. Weak muscles or you lose muscle control. You lose your balance or have trouble walking. You feel like you will pass out (faint) or you pass out. You are mixed up (confused). You have a seizure. These symptoms may be an emergency. Get help right away. Call your local emergency services (911 in the [place] Do not wait to see if the symptoms will go away. Do not drive yourself to the hospital. Summary A headache is pain or discomfort that  is felt around the head or neck area. There are many causes and types of headaches. In some cases, the cause may not be found. Keep a journal to help find out what causes your headaches. Watch your condition for any changes. Let your doctor know about them. Contact a doctor if you have a headache that is different from usual, or if medicine does not help your headache. Get help right away if your headache gets very bad, you throw up, you have trouble seeing, you lose your balance, or you have a seizure. This information is not intended to replace advice given to you by your health care provider. Make sure you  discuss any questions you have with your health care provider. Document Revised: 11/24/2020 Document Reviewed: 11/24/2020 Elsevier Patient Education  2024 [employer].

## 2024-05-17 ENCOUNTER — Encounter (HOSPITAL_COMMUNITY): Payer: Self-pay | Admitting: *Deleted

## 2024-05-17 ENCOUNTER — Other Ambulatory Visit: Payer: Self-pay

## 2024-05-17 ENCOUNTER — Emergency Department (HOSPITAL_COMMUNITY)

## 2024-05-17 ENCOUNTER — Emergency Department (HOSPITAL_COMMUNITY)
Admission: EM | Admit: 2024-05-17 | Discharge: 2024-05-17 | Disposition: A | Discharge status: Home / Self Care | Attending: Emergency Medicine | Admitting: Emergency Medicine

## 2024-05-17 DIAGNOSIS — N1330 Unspecified hydronephrosis: Secondary | ICD-10-CM | POA: Diagnosis not present

## 2024-05-17 DIAGNOSIS — E8809 Other disorders of plasma-protein metabolism, not elsewhere classified: Secondary | ICD-10-CM | POA: Insufficient documentation

## 2024-05-17 DIAGNOSIS — K5900 Constipation, unspecified: Secondary | ICD-10-CM | POA: Diagnosis not present

## 2024-05-17 DIAGNOSIS — T50905A Adverse effect of unspecified drugs, medicaments and biological substances, initial encounter: Secondary | ICD-10-CM | POA: Insufficient documentation

## 2024-05-17 DIAGNOSIS — R7309 Other abnormal glucose: Secondary | ICD-10-CM | POA: Diagnosis not present

## 2024-05-17 DIAGNOSIS — C787 Secondary malignant neoplasm of liver and intrahepatic bile duct: Secondary | ICD-10-CM | POA: Insufficient documentation

## 2024-05-17 DIAGNOSIS — K449 Diaphragmatic hernia without obstruction or gangrene: Secondary | ICD-10-CM | POA: Diagnosis not present

## 2024-05-17 DIAGNOSIS — E860 Dehydration: Secondary | ICD-10-CM | POA: Diagnosis not present

## 2024-05-17 DIAGNOSIS — K5903 Drug induced constipation: Secondary | ICD-10-CM | POA: Diagnosis not present

## 2024-05-17 DIAGNOSIS — C799 Secondary malignant neoplasm of unspecified site: Secondary | ICD-10-CM

## 2024-05-17 LAB — COMPREHENSIVE METABOLIC PANEL WITH GFR
ALT: 34 U/L (ref 0–44)
AST: 130 U/L — ABNORMAL HIGH (ref 15–41)
Albumin: 3 g/dL — ABNORMAL LOW (ref 3.5–5.0)
Alkaline Phosphatase: 217 U/L — ABNORMAL HIGH (ref 38–126)
Anion gap: 8 (ref 5–15)
BUN: 14 mg/dL (ref 8–23)
CO2: 27 mmol/L (ref 22–32)
Calcium: 8.1 mg/dL — ABNORMAL LOW (ref 8.9–10.3)
Chloride: 103 mmol/L (ref 98–111)
Creatinine, Ser: 0.87 mg/dL (ref 0.44–1.00)
GFR, Estimated: >60 mL/min (ref >60)
Glucose, Bld: 101 mg/dL — ABNORMAL HIGH (ref 70–99)
Potassium: 4.1 mmol/L (ref 3.5–5.1)
Sodium: 138 mmol/L (ref 135–145)
Total Bilirubin: 0.5 mg/dL (ref 0.0–1.2)
Total Protein: 5.6 g/dL — ABNORMAL LOW (ref 6.5–8.1)

## 2024-05-17 LAB — CBC WITH DIFFERENTIAL/PLATELET
Abs Immature Granulocytes: 0.04 K/uL (ref 0.00–0.07)
Basophils Absolute: 0 K/uL (ref 0.0–0.1)
Basophils Relative: 1 %
Eosinophils Absolute: 0 K/uL (ref 0.0–0.5)
Eosinophils Relative: 1 %
HCT: 28.1 % — ABNORMAL LOW (ref 36.0–46.0)
Hemoglobin: 8.9 g/dL — ABNORMAL LOW (ref 12.0–15.0)
Immature Granulocytes: 1 %
Lymphocytes Relative: 9 %
Lymphs Abs: 0.3 K/uL — ABNORMAL LOW (ref 0.7–4.0)
MCH: 31.8 pg (ref 26.0–34.0)
MCHC: 31.7 g/dL (ref 30.0–36.0)
MCV: 100.4 fL — ABNORMAL HIGH (ref 80.0–100.0)
Monocytes Absolute: 0.5 K/uL (ref 0.1–1.0)
Monocytes Relative: 12 %
Neutro Abs: 2.9 K/uL (ref 1.7–7.7)
Neutrophils Relative %: 76 %
Platelets: 116 K/uL — ABNORMAL LOW (ref 150–400)
RBC: 2.8 MIL/uL — ABNORMAL LOW (ref 3.87–5.11)
RDW: 15.2 % (ref 11.5–15.5)
WBC: 3.8 K/uL — ABNORMAL LOW (ref 4.0–10.5)
nRBC: 0.5 % — ABNORMAL HIGH (ref 0.0–0.2)

## 2024-05-17 LAB — LIPASE, BLOOD: Lipase: <10 U/L — ABNORMAL LOW (ref 11–51)

## 2024-05-17 MED ORDER — SODIUM CHLORIDE 0.9 % IV BOLUS
500.0000 mL | Freq: Once | INTRAVENOUS | Status: AC
  Administered 2024-05-17: 500 mL via INTRAVENOUS

## 2024-05-17 MED ORDER — HEPARIN SOD (PORK) LOCK FLUSH 100 UNIT/ML IV SOLN
500.0000 [IU] | Freq: Once | INTRAVENOUS | Status: AC
  Administered 2024-05-17: 500 [IU]
  Filled 2024-05-17: qty 5

## 2024-05-17 MED ORDER — SODIUM CHLORIDE 0.9 % IV SOLN: INTRAVENOUS | Status: DC

## 2024-05-17 MED ORDER — ONDANSETRON HCL 4 MG/2ML IJ SOLN
4.0000 mg | Freq: Once | INTRAMUSCULAR | Status: AC
  Administered 2024-05-17: 4 mg via INTRAVENOUS
  Filled 2024-05-17: qty 2

## 2024-05-17 NOTE — Discharge Instructions (Signed)
 Get the cancer center: Monday let him know you had CT scans done over the weekend.  They can use this information decide if they want to make any changes.  Return for any new or worse.  CT scan did not show any evidence of constipation.  Renal function was good so no significant dehydration.  But recommend small amounts of fluids frequently with sugar since your appetite is not very good.  And it does sound like that may be boost with no ice cream helps you with the taste of that so I would do that as well.

## 2024-05-17 NOTE — ED Triage Notes (Addendum)
 Ca pt: BIB family from home for: constipated, decreased PO intake, dehydration, back and stomach pain. Denies fever, NVD. Reports chronic back and stomach pain. Last BM was today, but small and not normal. Sx ongoing for ~ 2 weeks. Rates pain 8/10. Has a port. Alert, NAD, calm, interactive.

## 2024-05-17 NOTE — ED Provider Notes (Addendum)
 [place] EMERGENCY DEPARTMENT AT [facility] Provider Note   CSN: [id number] Arrival date & time: 05/17/24  1246     Patient presents with: Constipation   Doris Smith is a 73 y.o. female.   Patient here with several complaints.  Main complaint is she is concerned she is dehydrated because she has not had an appetite not drinking very well.  Has had some stomach pain as well and is concerned she may be constipated.  Patient is known to have metastatic breast cancer.  Was seen by oncology last on October 28.  Seen by palliative for pain management on October 29.  And on October 28 also had a thoracentesis.  Patient states that she has had nausea and dry heaves.  The abdominal pain is not severe.  Almost all these complaints have been present for a period of time but she is now concerned she has gotten dehydrated.       Prior to Admission medications   Medication Sig Start Date End Date Taking? Authorizing Provider  cyanocobalamin  1000 MCG tablet Take 1 tablet (1,000 mcg total) by mouth daily. 08/19/23   Jillian Buttery, MD  elacestrant hydrochloride (ORSERDU) 345 MG tablet Take 1 tablet (345 mg total) by mouth daily. Take with food. 04/10/24   Iruku, Praveena, MD  Incontinence Supply Disposable (CERTAINTY FITTED BRIEFS XL) MISC 1 Package by Does not apply route as needed. 11/30/22   Pickenpack-Cousar, Athena N, NP  lidocaine  (LIDODERM ) 5 % Place 2 patches onto the skin daily. Remove & Discard patch within 12 hours or as directed by MD 10/16/23   Pickenpack-Cousar, Athena N, NP  lidocaine -prilocaine  (EMLA ) cream Apply 1 Application topically as needed. Apply to port site 30-45 min prior to appointment. 01/29/24   Pickenpack-Cousar, Athena N, NP  LORazepam  (ATIVAN ) 1 MG tablet Take 1 tablet (1 mg total) by mouth 2 (two) times daily as needed for anxiety. 01/04/24   [name], FNP  magic mouthwash (nystatin , diphenhydrAMINE , alum & mag hydroxide) suspension mixture Swish  and swallow 5 mLs three times daily 05/01/24   Pickenpack-Cousar, Athena N, NP  OLANZapine  (ZYPREXA ) 5 MG tablet Take 1 tablet (5 mg total) by mouth at bedtime. 04/29/24   Iruku, Praveena, MD  ondansetron  (ZOFRAN ) 8 MG tablet TAKE 1 TABLET BY MOUTH EVERY 8 HOURS AS NEEDED FOR NAUSEA FOR VOMITING 05/05/24   Gladis, [name], FNP  oxyCODONE  (OXYCONTIN ) 15 mg 12 hr tablet Take 1 tablet (15 mg total) by mouth every 12 (twelve) hours. 04/29/24   Iruku, Praveena, MD  oxyCODONE -acetaminophen  (PERCOCET) 10-325 MG tablet Take 1 tablet by mouth every 4 (four) hours as needed for pain. 04/29/24   Iruku, Praveena, MD  pantoprazole  (PROTONIX ) 40 MG tablet Take 1 tablet (40 mg total) by mouth 2 (two) times daily before a meal. 01/04/24   Gladis, [name], FNP  polyethylene glycol (MIRALAX / GLYCOLAX) 17 g packet Take 17 g by mouth daily.    [provider]  pramipexole  (MIRAPEX ) 1 MG tablet Take 1 tablet (1 mg total) by mouth 3 (three) times daily. 05/16/24   Gladis [name], FNP  prochlorperazine  (COMPAZINE ) 10 MG tablet Take 1 tablet (10 mg total) by mouth every 6 (six) hours as needed for nausea or vomiting. 08/02/23   Pickenpack-Cousar, Fannie SAILOR, NP  sennosides-docusate sodium  (SENOKOT-S) 8.6-50 MG tablet Take 1 tablet by mouth daily.    [provider]    Allergies: Iohexol, Codeine, Palonosetron , Prednisone , Sulfonamide derivatives, Celecoxib, Sulfa antibiotics, and Vitamin b12  Review of Systems  Constitutional:  Positive for appetite change. Negative for chills and fever.  HENT:  Negative for ear pain and sore throat.   Eyes:  Negative for pain and visual disturbance.  Respiratory:  Negative for cough and shortness of breath.   Cardiovascular:  Negative for chest pain and palpitations.  Gastrointestinal:  Positive for abdominal pain, constipation and nausea. Negative for vomiting.  Genitourinary:  Negative for dysuria and hematuria.  Musculoskeletal:  Negative for  arthralgias and back pain.  Skin:  Negative for color change and rash.  Neurological:  Negative for seizures and syncope.  All other systems reviewed and are negative.   Updated Vital Signs BP (!) 105/47 (BP Location: Left Arm)   Pulse 90   Temp 97.8 F (36.6 C) (Oral)   Resp 18   Ht 1.626 m (5' 4)   Wt 63 kg   SpO2 98%   BMI 23.86 kg/m   Physical Exam Vitals and nursing note reviewed.  Constitutional:      General: She is not in acute distress.    Appearance: Normal appearance. She is well-developed.  HENT:     Head: Normocephalic and atraumatic.     Mouth/Throat:     Mouth: Mucous membranes are dry.  Eyes:     Extraocular Movements: Extraocular movements intact.     Conjunctiva/sclera: Conjunctivae normal.     Pupils: Pupils are equal, round, and reactive to light.  Cardiovascular:     Rate and Rhythm: Normal rate and regular rhythm.     Heart sounds: No murmur heard. Pulmonary:     Effort: Pulmonary effort is normal. No respiratory distress.     Breath sounds: Normal breath sounds.  Abdominal:     General: There is no distension.     Palpations: Abdomen is soft.     Tenderness: There is no abdominal tenderness. There is no guarding.  Musculoskeletal:        General: No swelling.     Cervical back: Neck supple.     Right lower leg: No edema.     Left lower leg: No edema.  Skin:    General: Skin is warm and dry.     Capillary Refill: Capillary refill takes less than 2 seconds.  Neurological:     General: No focal deficit present.     Mental Status: She is alert and oriented to person, place, and time.  Psychiatric:        Mood and Affect: Mood normal.     (all labs ordered are listed, but only abnormal results are displayed) Labs Reviewed - No data to display  EKG: None  Radiology: No results found.   Procedures   Medications Ordered in the ED - No data to display                                  Medical Decision Making Amount and/or  Complexity of Data Reviewed Labs: ordered. Radiology: ordered.  Risk Prescription drug management.   Patient clinically does appear to be dehydrated.  Patient has a diagnosis of stage IV metastatic breast cancer.  I will give some IV fluids.  Will get labs and electrolytes and will get CT scan of the abdomen.  Patient not in any respiratory distress oxygen saturation 98% temp 97.8 pulse 90 respirations 18 blood pressure 105/47.  Patient has a fairly significant dye allergy  so CT scan will have to be  done with out IV contrast.  CBC white count 3.8 hemoglobin 8.9 platelets 116.  Complete metabolic panel still pending.  CT scan abdomen and pelvis.  Shows increased bilateral pleural effusions and new trace abdominal pelvic ascites.  No bowel obstruction or other acute complication.  Progressive hepatic metastasis diffuse large volume osseous metastasis large hiatal hernia.  Similar left base airspace disease likely atelectasis.  Left hydronephrosis without hydroureter ureter favor a chronic UVJ obstruction dependent density in the left collecting system is likely due to small stone versus likely retained contrast.  Patient clinically not acting as if there is a ureteral stone.  Patient with fairly significant metastatic disease this is unfortunate.  Awaiting basic labs and will continue IV hydration.  Do not know if there is any renal Insufficiency or AKI.  Lipase less than 10.  Complete metabolic panel glucose 101 CO2 27 electrolytes normal.  Creatinine normal at 0.87 albumin low at 3 renal function GFR is greater than 60 which is very reassuring.  Total bili normal at 0.5 alk phos up at 217 but known to have the bony metastasis.  AST 130 ALT 34.  No significant change in the liver function test.  And no evidence of any significant dehydration at this time.  Patient received IV fluids here.  Will have a discussion with her.  Can probably follow-up with her cancer center oncologist on  Monday.  Addition also no evidence of constipation.  Final diagnoses:  Dehydration  Metastatic malignant neoplasm, unspecified site [facility])  Drug-induced constipation    ED Discharge Orders     None          Geraldene Hamilton, MD 05/17/24 1317    Geraldene Hamilton, MD 05/17/24 1330    Geraldene Hamilton, MD 05/17/24 1454    Geraldene Hamilton, MD 05/17/24 1456    Geraldene Hamilton, MD 05/17/24 1504

## 2024-05-19 ENCOUNTER — Telehealth: Payer: Self-pay | Admitting: Gastroenterology

## 2024-05-19 ENCOUNTER — Telehealth: Payer: Self-pay

## 2024-05-19 MED ORDER — SUCRALFATE 1 GM/10ML PO SUSP
1.0000 g | Freq: Three times a day (TID) | ORAL | 1 refills | Status: DC
Start: 2024-05-19 — End: 2024-05-30

## 2024-05-19 NOTE — Telephone Encounter (Signed)
 Spoke with pt. Pt agrees to try Carafate . Per order set, suspension is on formulary for pt. Rx sent to pharmacy.

## 2024-05-19 NOTE — Telephone Encounter (Signed)
 Spoke with pt. She reports that she is having a burning sensation with everything that she eats and drinks. Currently takes Protonix  40 mg po BID with meals, but she doesn't feel like this is helping her as it should be. Pt last seen during hospital admit by Dr. Avram in February for EGD. F/U appt scheduled for 05/30/2024 at 1430.

## 2024-05-19 NOTE — Telephone Encounter (Signed)
 Inbound call from patient stating that she is needing for Dr. Shila to change her heart burn medication due to her still being in a lot of pain from her GERD. Patient is requesting a call back. please advise.

## 2024-05-19 NOTE — Telephone Encounter (Signed)
 S/w patient at length regarding recent after-hours message and recent phone call about ongoing c/o indigestion. Patient was advised to go to the ED on 11/8 d/t constipation and fatigue. Patient received IVF, labs, and imaging while at the ED.  Patient called today to ask for advice on ongoing indigestion. Mallie Combes, PA-C and Levon Borer, NP reviewed  patient's case.  Patient has been advised to follow-up with GI provider for further eval. Symptoms are likely related to known hiatal hernia.  Patient verbalized an understanding of the information and was provided office number to reach out to GI.  Confirmed upcoming appointments at [facility] with patient.  Home medications reviewed with patient. Strict ED precautions also reviewed.

## 2024-05-20 NOTE — Telephone Encounter (Signed)
 Spoke w/pt stating she has bone cancer & is going through treatments & lately feels vision is more blurry than normal even with current glasses using Systane 2 times daily, but doesn't seem to help much  -Denies any actual eye pain, no photophobia, no eye redness, no discharge, no missing/loss in vision & no curtain/veil over vision at this time  -Advise pt switch to PF tears (name brand only) 4-5 times daily, start gel drop at bedtime & start warm compresses through out the day -Stressed to pt if no better, things gets worse or new symptoms come up let us  know ASAP -Pt expressed understanding

## 2024-05-21 ENCOUNTER — Telehealth: Payer: Self-pay

## 2024-05-21 NOTE — Telephone Encounter (Signed)
 Copied from CRM [id number]. Topic: Complaint (DO NOT CONVERT) - Care >> Oct 23, 2023 11:50 AM Powell HERO wrote: Date of Incident: 10/23/2023 @ 9 AM  Details of complaint: Patient saw Nena for chief complaint of Dizziness. Patient states no one checked in her ears noes and throat, also stated range of motion was tested on appointment notes and it was not, notes also state skin was warm and dry but no one ever checked it in office, also notes states her eyes were checked and reactive to light but no one checked her eyes.  The AVS suggested drink more water and eat smaller meals for her dizziness.  Feels like she was not taken seriously or even looked over long enough in any way to help understand why she was dizzy.  States her husband also came in at the same time with the same complaint and saw a different provider and was diagnosed with inner ear infection and prescribed medication. Doesn't understand why she was sent home with nothing to help in her condition that is the same as her husbands.  How would the patient like to see it resolved? Every patient to be treated like they are the only ones that are there. Family thinks she was brushed off because she has stage 4 cancer, and does not have a good prognosis. Please call [phone], her sister Orie, to follow up.  On a scale of 1-10, how was your experience? 0 What would it take to bring it to a 10? Every patient to be treated like they are the only ones that are there. They should be listened to in depth and fully checked over and not feel rushed with their provider.  Route to Research Officer, [employer].  Discussion has been made with provider

## 2024-05-21 NOTE — Telephone Encounter (Signed)
 Pt called back and was asking about Carafate  that was prescribed 11/10. We reviewed how to take this medication and she verbalized understanding.

## 2024-05-21 NOTE — Telephone Encounter (Signed)
 Pt called x2 and LVM asking for medication instructions but did not provide which medication she needs instructions for. Attempted to call pt. LVM for call back.

## 2024-05-22 ENCOUNTER — Inpatient Hospital Stay

## 2024-05-22 ENCOUNTER — Inpatient Hospital Stay: Admitting: Nurse Practitioner

## 2024-05-22 ENCOUNTER — Inpatient Hospital Stay: Attending: Hematology and Oncology | Admitting: Hematology and Oncology

## 2024-05-22 ENCOUNTER — Encounter: Payer: Self-pay | Admitting: Nurse Practitioner

## 2024-05-22 VITALS — BP 107/51 | HR 75 | Temp 98.0°F | Resp 18 | Wt 138.1 lb

## 2024-05-22 DIAGNOSIS — R630 Anorexia: Secondary | ICD-10-CM | POA: Diagnosis not present

## 2024-05-22 DIAGNOSIS — G893 Neoplasm related pain (acute) (chronic): Secondary | ICD-10-CM | POA: Diagnosis not present

## 2024-05-22 DIAGNOSIS — Z515 Encounter for palliative care: Secondary | ICD-10-CM

## 2024-05-22 DIAGNOSIS — Z170 Estrogen receptor positive status [ER+]: Secondary | ICD-10-CM | POA: Diagnosis not present

## 2024-05-22 DIAGNOSIS — C50411 Malignant neoplasm of upper-outer quadrant of right female breast: Secondary | ICD-10-CM | POA: Diagnosis not present

## 2024-05-22 DIAGNOSIS — F419 Anxiety disorder, unspecified: Secondary | ICD-10-CM

## 2024-05-22 DIAGNOSIS — R530 Neoplastic (malignant) related fatigue: Secondary | ICD-10-CM | POA: Diagnosis not present

## 2024-05-22 DIAGNOSIS — R110 Nausea: Secondary | ICD-10-CM

## 2024-05-22 DIAGNOSIS — R634 Abnormal weight loss: Secondary | ICD-10-CM

## 2024-05-22 LAB — CBC WITH DIFFERENTIAL (CANCER CENTER ONLY)
Abs Immature Granulocytes: 0.04 K/uL (ref 0.00–0.07)
Basophils Absolute: 0 K/uL (ref 0.0–0.1)
Basophils Relative: 1 %
Eosinophils Absolute: 0.1 K/uL (ref 0.0–0.5)
Eosinophils Relative: 2 %
HCT: 25.3 % — ABNORMAL LOW (ref 36.0–46.0)
Hemoglobin: 8.4 g/dL — ABNORMAL LOW (ref 12.0–15.0)
Immature Granulocytes: 1 %
Lymphocytes Relative: 9 %
Lymphs Abs: 0.3 K/uL — ABNORMAL LOW (ref 0.7–4.0)
MCH: 32.3 pg (ref 26.0–34.0)
MCHC: 33.2 g/dL (ref 30.0–36.0)
MCV: 97.3 fL (ref 80.0–100.0)
Monocytes Absolute: 0.5 K/uL (ref 0.1–1.0)
Monocytes Relative: 14 %
Neutro Abs: 2.7 K/uL (ref 1.7–7.7)
Neutrophils Relative %: 73 %
Platelet Count: 127 K/uL — ABNORMAL LOW (ref 150–400)
RBC: 2.6 MIL/uL — ABNORMAL LOW (ref 3.87–5.11)
RDW: 15.5 % (ref 11.5–15.5)
WBC Count: 3.8 K/uL — ABNORMAL LOW (ref 4.0–10.5)
nRBC: 1.1 % — ABNORMAL HIGH (ref 0.0–0.2)

## 2024-05-22 LAB — CMP (CANCER CENTER ONLY)
ALT: 28 U/L (ref 0–44)
AST: 116 U/L — ABNORMAL HIGH (ref 15–41)
Albumin: 2.9 g/dL — ABNORMAL LOW (ref 3.5–5.0)
Alkaline Phosphatase: 206 U/L — ABNORMAL HIGH (ref 38–126)
Anion gap: 6 (ref 5–15)
BUN: 12 mg/dL (ref 8–23)
CO2: 28 mmol/L (ref 22–32)
Calcium: 7.7 mg/dL — ABNORMAL LOW (ref 8.9–10.3)
Chloride: 105 mmol/L (ref 98–111)
Creatinine: 0.89 mg/dL (ref 0.44–1.00)
GFR, Estimated: >60 mL/min (ref >60)
Glucose, Bld: 124 mg/dL — ABNORMAL HIGH (ref 70–99)
Potassium: 3.8 mmol/L (ref 3.5–5.1)
Sodium: 139 mmol/L (ref 135–145)
Total Bilirubin: 0.5 mg/dL (ref 0.0–1.2)
Total Protein: 5.4 g/dL — ABNORMAL LOW (ref 6.5–8.1)

## 2024-05-22 NOTE — Progress Notes (Signed)
 Palliative Medicine [facility]  Telephone:(336) [phone] [phone]   Name: Doris Smith Date: 05/22/2024 MRN: [id number]  DOB: Aug 25, 1950  Patient Care Team: [name], FNP as PCP - General (Family Medicine) Jerilynn Lamarr HERO, NP as PCP - Cardiology (Nurse Practitioner) Ethyl Lenis, MD as Consulting Physician (General Surgery) Dewey Rush, MD as Consulting Physician (Radiation Oncology) Laymon Soulier, MD as Consulting Physician (Pulmonary Disease) Kay Kemps, MD as Consulting Physician (Orthopedic Surgery) Duwayne Purchase, MD as Consulting Physician (Orthopedic Surgery) Kallie Medici, NP as Nurse Practitioner (Adult Health Nurse Practitioner) Tyree Nanetta SAILOR, RN as Oncology Nurse Navigator Arelia Filippo, MD as Consulting Physician (Plastic Surgery) Lavona Agent, MD as Consulting Physician (Cardiology) Loretha Ash, MD as Medical Oncologist (Hematology and Oncology) Pickenpack-Cousar, Fannie SAILOR, NP as Nurse Practitioner (Hospice and Palliative Medicine)   INTERVAL HISTORY: Doris Smith is a 73 y.o. female with oncologic medical history including estrogen receptor positive breast cancer(07/2018) s/p right mastectomy. PET scan 10/06/22 showing widespread osseous metastasis. Other pertinent history includes atrial fibrillation, asthma, OSA, GERD, fatty liver, fibromyalgia, osteoporosis, chronic headaches, anxiety, depression, and bipolar disorder. Underwent ORIF of right hip 09/20/2022. Palliative ask to see for symptom and pain management and goals of care.   SOCIAL HISTORY:     reports that she has never smoked. She has never used smokeless tobacco. She reports that she does not drink alcohol and does not use drugs.  ADVANCE DIRECTIVES:  On file  CODE STATUS: Full code  PAST MEDICAL HISTORY: Past Medical History:  Diagnosis Date   A-fib (HCC) 11/2012   PAF   Anxiety    takes Ativan    Asthma 04/07/2011    dx   Bipolar disorder (HCC)    Cancer (HCC)    Right breast   Depression    Early cataracts, bilateral    Fatty liver    Fibromyalgia    GERD (gastroesophageal reflux disease)    Irritable bowel syndrome    Mental disorder    dx bipolar   PONV (postoperative nausea and vomiting)    Sleep apnea 04/2011   does not wear CPAP   Tardive dyskinesia     ALLERGIES:  is allergic to iohexol, codeine, palonosetron , prednisone , sulfonamide derivatives, celecoxib, sulfa antibiotics, and vitamin b12.  MEDICATIONS:  Current Outpatient Medications  Medication Sig Dispense Refill   cyanocobalamin  1000 MCG tablet Take 1 tablet (1,000 mcg total) by mouth daily. 60 tablet 0   elacestrant hydrochloride (ORSERDU) 345 MG tablet Take 1 tablet (345 mg total) by mouth daily. Take with food. 30 tablet 1   Incontinence Supply Disposable (CERTAINTY FITTED BRIEFS XL) MISC 1 Package by Does not apply route as needed. 56 each 3   lidocaine  (LIDODERM ) 5 % Place 2 patches onto the skin daily. Remove & Discard patch within 12 hours or as directed by MD 60 patch 2   lidocaine -prilocaine  (EMLA ) cream Apply 1 Application topically as needed. Apply to port site 30-45 min prior to appointment. 30 g 3   LORazepam  (ATIVAN ) 1 MG tablet Take 1 tablet (1 mg total) by mouth 2 (two) times daily as needed for anxiety. 60 tablet 5   magic mouthwash (nystatin , diphenhydrAMINE , alum & mag hydroxide) suspension mixture Swish and swallow 5 mLs three times daily 150 mL 1   OLANZapine  (ZYPREXA ) 5 MG tablet Take 1 tablet (5 mg total) by mouth at bedtime. 30 tablet 0   ondansetron  (ZOFRAN ) 8 MG tablet TAKE 1 TABLET BY MOUTH EVERY 8  HOURS AS NEEDED FOR NAUSEA FOR VOMITING 30 tablet 0   oxyCODONE  (OXYCONTIN ) 15 mg 12 hr tablet Take 1 tablet (15 mg total) by mouth every 12 (twelve) hours. 60 tablet 0   oxyCODONE -acetaminophen  (PERCOCET) 10-325 MG tablet Take 1 tablet by mouth every 4 (four) hours as needed for pain. 60 tablet 0    pantoprazole  (PROTONIX ) 40 MG tablet Take 1 tablet (40 mg total) by mouth 2 (two) times daily before a meal. 180 tablet 1   polyethylene glycol (MIRALAX / GLYCOLAX) 17 g packet Take 17 g by mouth daily.     pramipexole  (MIRAPEX ) 1 MG tablet Take 1 tablet (1 mg total) by mouth 3 (three) times daily. 90 tablet 1   prochlorperazine  (COMPAZINE ) 10 MG tablet Take 1 tablet (10 mg total) by mouth every 6 (six) hours as needed for nausea or vomiting. 30 tablet 2   sennosides-docusate sodium  (SENOKOT-S) 8.6-50 MG tablet Take 1 tablet by mouth daily.     sucralfate  (CARAFATE ) 1 GM/10ML suspension Take 10 mLs (1 g total) by mouth 4 (four) times daily -  with meals and at bedtime. 420 mL 1   No current facility-administered medications for this visit.    VITAL SIGNS: There were no vitals taken for this visit. There were no vitals filed for this visit.    Estimated body mass index is 23.7 kg/m as calculated from the following:   Height as of 05/17/24: 5' 4 (1.626 m).   Weight as of an earlier encounter on 05/22/24: 138 lb 1.6 oz (62.6 kg).  Assessment  NAD RRR Normal breathing pattern AAO x3  PERFORMANCE STATUS (ECOG) : 1 - Symptomatic but completely ambulatory  IMPRESSION: Discussed the use of AI scribe software for clinical note transcription with the patient, who gave verbal consent to proceed.  History of Present Illness Doris Smith is a 73 year old female with cancer who presented to clinic for follow-up. She is accompanied by her husband and sister, Doris Smith. Denies concerns for constipation or diarrhea.   She experiences significant fatigue. Despite going to bed between 7 and 9 PM and waking up around 6:30 AM, she takes two naps during the day, each lasting about an hour.   She experiences significant nausea and has difficulty eating, often feeling like she will vomit if she eats more. She takes Compazine  and Zofran  for nausea and olanzapine  at bedtime, but her appetite  remains poor. She plans to try mixing Boost with vanilla bean ice cream to increase her protein intake. We also discussed focusing on small frequent meals, snacking through the day, and high protein foods. Education provided on premedicating with antiemetics prior to meals to allow for tolerance.    She has a large hiatal hernia, which contributes to her nausea and difficulty eating. The hernia causes pressure and a feeling of fullness, making it challenging to consume large meals. She and family are realistic in sharing awareness she is not a surgical candidate however she does have an upcoming appointment with GI on next week for additional support.   We discussed her pain at length. She feels her pain is well controlled. No adjustments to current regimen. Lynwood shares unfortunately her insurance will no longer cover her Oxycontin  after the first of the year. Education provided and family aware of plans to transition to morphine  use prior to running out to prevent delays in medication after January 1.   All questions answered and support provided.   Goals of Care Mrs.  Sher is emotional expressing her willingness to live. She states clearly her goal is to continue to treat the treatable allow her every opportunity to continue to thrive.  She speaks to her wishes of returning back to her home which is currently under renovations.  Patient states she is excepting a passing away when God calls her home however she is hopeful that this time is not near.  Emotional support provided.  Patient and family continue to take things one day at a time.  11/30/22: Mrs. Nguyen is emotional. Her husband becomes emotional as patient shares she knows her health is not the best and her cancer is not curable. She is taking life one day at time. Speaks to when her time comes she will be ready but hopeful it is not now. She knows that no one has a definitive time frame. Expresses her appreciation in all of the care and support.  Expresses her goal is to continue to treat the treatable allowing her the ability to continue to thrive while not suffering.    4/30: Since the night of 10/30/22, Mrs. Bertini has continued to meet goal of lying in her own bed at night beside her husband. She expresses appreciation for the care of everyone at the cancer center and is hopeful for continued improvement.  I discussed the importance of continued conversation with family and their medical providers regarding overall plan of care and treatment options, ensuring decisions are within the context of the patients values and GOCs. Assessment & Plan Malignant neoplasm of bone with chronic neoplasm-related pain Chronic pain related to malignant neoplasm of bone, with a notable knot in the middle of the back causing throbbing and radiating pain. Current pain management includes oxycodone  and oxycontin , with adjustments made to optimize relief. Insurance is notify patient there were no longer cover her OxyContin  effective July 10, 2024.  We discussed plans to switch to morphine  extended release. Previous use of morphine  without allergy  noted.  - Will switch to morphine  extended release 15 mg at the end of the week. - Continue oxycodone  and oxycontin  for pain management. - Refill medications as requested  Oral plaque with dry heaves Reports plaque on tongue in the morning leading to dry heaves. Using mouthwash and Careafate effectively to manage symptoms. - Continue using mouthwash, especially before bed, to manage oral plaque and associated dry heaves.  Indigestion Experiencing indigestion, possibly exacerbated by certain drinks. Currently taking Protonix  twice daily. Consuming Crystal Light lemonade without issues. - Consider trying sparkling water as an alternative beverage to help with indigestion.  Large hiatal hernia with associated nausea, vomiting, and protein-calorie malnutrition Large hiatal hernia causing significant nausea, vomiting,  and protein-calorie malnutrition. Surgery is not an option. Symptoms managed with dietary modifications and medications. Compazine  is effective for nausea. Cold and room temperature foods and small frequent meals recommended to manage symptoms. - Continue Compazine  every six hours as needed, especially 30-45 minutes before meals. - Encouraged small frequent meals to manage nausea and improve protein intake. - Will consult with GI specialist for further management options. - Consider mixing Boost with ice cream to improve palatability and protein intake.  Fatigue Fatigue likely multifactorial, including medication side effects and poor sleep quality. Current sleep pattern includes early morning and afternoon naps. Medications may contribute to drowsiness. - Limit daytime naps to avoid excessive sleepiness. - Adjust medication timing to improve nighttime sleep quality.  Anxiety Managed with lorazepam  and olanzapine . Lorazepam  taken in the afternoon, contributing to daytime sleepiness. Olanzapine   taken at bedtime. - Continue current anxiety medication regimen. - Monitor for excessive sedation and adjust as needed.  I will plan to see patient back in 2-3 weeks. Sooner if needed.   Patient expressed understanding and was in agreement with this plan. She also understands that She can call the clinic at any time with any questions, concerns, or complaints.   Any controlled substances utilized were prescribed in the context of palliative care. PDMP has been reviewed.   Visit consisted of counseling and education dealing with the complex and emotionally intense issues of symptom management and palliative care in the setting of serious and potentially life-threatening illness.  Levon Borer, AGPCNP-BC  Palliative Medicine Team/[facility]   bloo

## 2024-05-22 NOTE — Progress Notes (Signed)
 [facility] Cancer Follow up:    [name], FNP [address] [place] [address]   DIAGNOSIS:  Cancer Staging  Malignant neoplasm of upper-outer quadrant of right breast in female, estrogen receptor positive (HCC) Staging form: Breast, AJCC 8th Edition - Pathologic: Stage IB (pT3, pN28mi, cM0, G2, ER+, PR+, HER2-) - Signed by Crawford Morna Pickle, NP on 10/02/2018 Multigene prognostic tests performed: MammaPrint Histologic grading system: 3 grade system - Clinical stage from 12/28/2022: Stage IV (rcT2, rcN0, rcM1, G3, ER+, PR+, HER2-) - Signed by Loretha Ash, MD on 12/28/2022 Stage prefix: Recurrence Histologic grading system: 3 grade system Laterality: Right Staged by: Pathologist and managing physician Stage used in treatment planning: Yes National guidelines used in treatment planning: Yes Type of national guideline used in treatment planning: NCCN    SUMMARY OF ONCOLOGIC HISTORY: Oncology History  Malignant neoplasm of upper-outer quadrant of right breast in female, estrogen receptor positive (HCC)  08/08/2018 Initial Diagnosis   status post right breast biopsy 08/02/2018 for a clinical T3 N0, stage IIA invasive lobular carcinoma, grade 2, estrogen receptor strongly positive, progesterone receptor 1% positive, with no HER-2 amplification and an MIB-1 of 1%.             (a) CT scan of the head and chest, without contrast 08/23/2018 showed nonspecific 0.4 cm left lower lobe pulmonary nodule, no definitive metastatic disease             (b) bone scan 08/23/2018 shows multiple spinal areas of abnormal uptake, but             (c) total spinal MRI 09/03/2018 finds bone scan findings to be secondary to degenerative disease, no evidence of metastatic disease.   08/2018 - 10/2022 Anti-estrogen oral therapy   Anastrozole ; discontinued 10/14/2018 in preparation for chemotherapy, resumed October 2020   09/16/2018 Surgery   Right mastectomy [facility])  [phone]): Invasive Lobular Carcinoma, 10.5 cm, grade 2, negative margins. 1 of 5 lymph nodes positive for carcinoma.   09/16/2018 Miscellaneous   MammaPrint high risk suggests a 5-year metastasis free survival of 93% with chemotherapy, with an absolute chemotherapy benefit in the greater than 12% range    09/16/2018 Miscellaneous   Caris testing on mastectomy sample (09/16/2018) showed stable MSI and proficient mismatch repair status, with a low mutational burden; BRCA 1 and 2 were not mutated, PI K3 was not mutated, ER B B2 was not mutated, and AKT 1 was not mutated. 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 Administration: 0.25 mg (11/05/2018), 0.25 mg (11/26/2018), 0.25 mg (12/17/2018), 0.25 mg (01/07/2019), 0.25 mg (01/28/2019), 0.25 mg (02/18/2019), 0.25 mg (03/11/2019), 0.25 mg (04/02/2019)  methotrexate  (PF) chemo injection 84 mg, 39.8 mg/m2 = 84.5 mg, Intravenous,  Once, 5 of 5 cycles. Administration: 84 mg (11/05/2018), 84 mg (11/26/2018), 84 mg (12/17/2018), 84 mg (03/11/2019), 84 mg (04/02/2019)  pegfilgrastim -cbqv (UDENYCA ) injection 6 mg, 6 mg, Subcutaneous, Once, 6 of 6 cycles. Administration: 6 mg (12/19/2018)  cyclophosphamide  (CYTOXAN ) 1,260 mg in sodium chloride  0.9 % 250 mL chemo infusion, 600 mg/m2 = 1,260 mg, Intravenous,  Once, 8 of 8 cycle. Administration: 1,260 mg (11/05/2018), 1,260 mg (11/26/2018), 1,260 mg (12/17/2018), 1,260 mg (01/07/2019), 1,260 mg (01/28/2019), 1,260 mg (02/18/2019), 1,260 mg (03/11/2019), 1,260 mg (04/02/2019)  fluorouracil  (ADRUCIL ) chemo injection  1,250 mg, 600 mg/m2 = 1,250 mg, Intravenous,  Once, 8 of 8 cycles. Administration: 1,250 mg (11/05/2018), 1,250 mg (11/26/2018), 1,250 mg (12/17/2018), 1,250 mg (01/07/2019), 1,250 mg (01/28/2019), 1,250 mg (02/18/2019), 1,250 mg (03/11/2019), 1,250 mg  (04/02/2019).    12/31/2018 - 02/19/2019 Radiation Therapy   The patient initially received a dose of 50.4 Gy in 28 fractions to the chest wall and supraclavicular region. This was delivered using a 3-D conformal, 4 field technique. The patient then received a boost to the mastectomy scar. This delivered an additional 10 Gy in 5 fractions using an en face electron field. The total dose was 60.4 Gy.   09/12/2022 Imaging   Bone scan on 09/12/2022 shows uptake in ribs, manubrium. Widespread osseous metastasis confirmed on PET scan that was completed on October 06, 2022. Right iliac crest biopsy demonstrated metastatic carcinoma consistent with patient's known breast carcinoma. ER 90% positive, PR 90% positive, Ki67 10%, HER2 negative.    10/10/2022 Treatment Plan Change   Faslodex  beginning 10/10/2022; Ibrance  beginning 12/20/2022; Zometa  every 12 weeks 11/07/2022    10/23/2022 - 11/03/2022 Radiation Therapy   Palliative Radiation 10/23/2022-11/03/2022:  left chest wall, lower T spine, and right proximal hip/pelvis were each treated to 30 GY in 10 fractions.      CURRENT THERAPY: Elacestrant, Zometa  (every 12 weeks)  INTERVAL HISTORY:  Discussed the use of AI scribe software for clinical note transcription with the patient, who gave verbal consent to proceed.  History of Present Illness  Doris Smith is a 73 year old female with metastatic breast cancer who presents with worsening symptoms and concerns about treatment side effects. She is accompanied by a family member.  She feels miserable and exhausted. She describes feeling down, drug out, and not up to par, with excessive sleepiness and constant fatigue.  She has a history of metastatic breast cancer with involvement in her bones, liver, and pleural fluid. Recent imaging on November 8th indicated worsening fluid accumulation around the lungs and liver involvement. She has not had fluid drained recently but recalls a drainage procedure about  a month ago where over two cups of fluid were removed.  She started a new medication, elacestrant, on October 9th. Since starting this medication, she has experienced two days of headaches and ongoing heartburn. She is unsure if these symptoms are side effects of the medication or related to her cancer.  She is currently taking Protonix  twice a day for heartburn and uses a mouth rinse to help with acid-related symptoms. Despite these treatments, she experiences significant indigestion and heartburn. These symptoms contribute to her difficulty eating, as she fears feeling miserable after meals.  She has a history of cancer in her stomach and is scheduled to see a gastroenterologist next week due to ongoing gastric symptoms. There is concern that her breast cancer, known for spreading to the gut, may be contributing to her symptoms  Sister and husband present  Rest of the pertinent 10 point ROS reviewed and neg.    Patient Active Problem List   Diagnosis Date Noted   Malnutrition of moderate degree 08/17/2023   GAVE (gastric antral vascular ectasia) 08/17/2023   Gastric erosion 08/17/2023   Melena 08/16/2023   Anemia associated with acute blood loss 08/16/2023   Peripheral edema 12/29/2022   Restless leg syndrome 10/03/2021   Anxiety associated with depression 06/10/2020   Port-A-Cath in place 11/12/2018   Malignant neoplasm of upper-outer quadrant of right breast in female, estrogen receptor positive (HCC) 08/08/2018  Right knee pain 03/01/2015   Aortic atherosclerosis 12/28/2014   Obesity (BMI 30-39.9) 04/09/2014   Insomnia due to stress 04/09/2014   Atrial fibrillation (HCC) 10/01/2013   GERD (gastroesophageal reflux disease) 10/01/2013   Bipolar disorder (HCC) 09/27/2007   Essential hypertension 09/27/2007   ALLERGIC RHINITIS 09/27/2007   IRRITABLE BOWEL SYNDROME 09/27/2007   Arthropathy 09/27/2007   DEGENERATIVE DISC DISEASE, LUMBOSACRAL SPINE 09/27/2007    is allergic to  iohexol, codeine, palonosetron , prednisone , sulfonamide derivatives, celecoxib, sulfa antibiotics, and vitamin b12.  MEDICAL HISTORY: Past Medical History:  Diagnosis Date   A-fib (HCC) 11/2012   PAF   Anxiety    takes Ativan    Asthma 04/07/2011   dx   Bipolar disorder (HCC)    Cancer (HCC)    Right breast   Depression    Early cataracts, bilateral    Fatty liver    Fibromyalgia    GERD (gastroesophageal reflux disease)    Irritable bowel syndrome    Mental disorder    dx bipolar   PONV (postoperative nausea and vomiting)    Sleep apnea 04/2011   does not wear CPAP   Tardive dyskinesia     SURGICAL HISTORY: Past Surgical History:  Procedure Laterality Date   ABDOMINAL HYSTERECTOMY     BIOPSY  08/17/2023   Procedure: BIOPSY;  Surgeon: Avram Lupita BRAVO, MD;  Location: [facility];  Service: Gastroenterology;;   COLONOSCOPY     DILATION AND CURETTAGE OF UTERUS  1981   abnormal pap   ESOPHAGOGASTRODUODENOSCOPY (EGD) WITH PROPOFOL  N/A 08/17/2023   Procedure: ESOPHAGOGASTRODUODENOSCOPY (EGD) WITH PROPOFOL ;  Surgeon: Avram Lupita BRAVO, MD;  Location: [facility];  Service: Gastroenterology;  Laterality: N/A;   HOT HEMOSTASIS N/A 08/17/2023   Procedure: HOT HEMOSTASIS (ARGON PLASMA COAGULATION/BICAP);  Surgeon: Avram Lupita BRAVO, MD;  Location: [facility];  Service: Gastroenterology;  Laterality: N/A;   IR IMAGING GUIDED PORT INSERTION  11/20/2023   IR THORACENTESIS ASP PLEURAL SPACE W/IMG GUIDE  09/13/2023   IR THORACENTESIS ASP PLEURAL SPACE W/IMG GUIDE  11/20/2023   IR THORACENTESIS ASP PLEURAL SPACE W/IMG GUIDE  03/20/2024   IR THORACENTESIS ASP PLEURAL SPACE W/IMG GUIDE  05/06/2024   KNEE ARTHROSCOPY Left 2007   x 2   LUMBAR LAMINECTOMY/DECOMPRESSION MICRODISCECTOMY  05/31/2011   Procedure: LUMBAR LAMINECTOMY/DECOMPRESSION MICRODISCECTOMY;  Surgeon: Reyes JAYSON Billing;  Location: [facility];  Service: Orthopedics;  Laterality: Left;  Decompression Lumbar four to five and  Lumbar five to  Sacral one on Left  (X-Ray)   MASTECTOMY W/ SENTINEL NODE BIOPSY Right 09/16/2018   Procedure: RIGHT MASTECTOMY WITH RIGHT AXILLARY SENTINEL LYMPH NODE BIOPSY;  Surgeon: Ethyl Lenis, MD;  Location: [facility];  Service: General;  Laterality: Right;   PARTIAL HYSTERECTOMY  1982   PORTACATH PLACEMENT Left 10/31/2018   Procedure: INSERTION PORT-A-CATH WITH ULTRASOUND;  Surgeon: Ethyl Lenis, MD;  Location: [facility];  Service: General;  Laterality: Left;   TONSILLECTOMY     as child   TOTAL KNEE ARTHROPLASTY Left 12/18/2005    SOCIAL HISTORY: Social History   Socioeconomic History   Marital status: Married    Spouse name: Lynwood   Number of children: 2   Years of education: 12   Highest education level: Not on file  Occupational History   Occupation: Disabled  Tobacco Use   Smoking status: Never   Smokeless tobacco: Never  Vaping Use   Vaping status: Never Used  Substance and Sexual Activity   Alcohol use: No   Drug  use: No   Sexual activity: Yes    Birth control/protection: Post-menopausal, Surgical  Other Topics Concern   Not on file  Social History Narrative   Tea daily.  Rarely has caffeine    Social Drivers of Corporate Investment Banker Strain: Patient Declined (10/22/2023)   Overall Financial Resource Strain (CARDIA)    Difficulty of Paying Living Expenses: Patient declined  Food Insecurity: Patient Declined (10/22/2023)   Hunger Vital Sign    Worried About Running Out of Food in the Last Year: Patient declined    Ran Out of Food in the Last Year: Patient declined  Transportation Needs: No Transportation Needs (10/22/2023)   PRAPARE - Administrator, Civil Service (Medical): No    Lack of Transportation (Non-Medical): No  Physical Activity: Unknown (10/22/2023)   Exercise Vital Sign    Days of Exercise per Week: 0 days    Minutes of Exercise per Session: Not on file  Stress: Stress Concern Present (10/22/2023)   [employer] of  Occupational Health - Occupational Stress Questionnaire    Feeling of Stress : To some extent  Social Connections: Unknown (10/22/2023)   Social Connection and Isolation Panel    Frequency of Communication with Friends and Family: More than three times a week    Frequency of Social Gatherings with Friends and Family: Twice a week    Attends Religious Services: Patient declined    Database Administrator or Organizations: No    Attends Banker Meetings: Never    Marital Status: Married  Catering Manager Violence: Not At Risk (08/16/2023)   Humiliation, Afraid, Rape, and Kick questionnaire    Fear of Current or Ex-Partner: No    Emotionally Abused: No    Physically Abused: No    Sexually Abused: No    FAMILY HISTORY: Family History  Problem Relation Age of Onset   Anesthesia problems Mother    Heart disease Mother        CHF, atrial fib   Heart failure Mother    Anesthesia problems Sister    Colon polyps Father    Heart disease Father        Fluid around the heart   Hypertension Sister    Breast cancer Maternal Aunt    Colon cancer Maternal Aunt    Prostate cancer Neg Hx    Ovarian cancer Neg Hx       PHYSICAL EXAMINATION    Vitals:   05/22/24 1023  BP: (!) 107/51  Pulse: 75  Resp: 18  Temp: 98 F (36.7 C)  SpO2: 94%    Physical Exam Constitutional:      General: She is not in acute distress.    Comments: Appears frail  HENT:     Head: Normocephalic and atraumatic.     Mouth/Throat:     Mouth: Mucous membranes are moist.     Pharynx: Oropharynx is clear. No oropharyngeal exudate or posterior oropharyngeal erythema.  Eyes:     General: No scleral icterus. Cardiovascular:     Rate and Rhythm: Normal rate and regular rhythm.     Pulses: Normal pulses.     Heart sounds: Normal heart sounds.  Pulmonary:     Effort: Pulmonary effort is normal.     Comments: Diminished air entry left lung base. Right lung with good air movement. No adv  sounds. Abdominal:     General: Abdomen is flat. Bowel sounds are normal. There is no distension.  Palpations: Abdomen is soft.     Tenderness: There is no abdominal tenderness.  Musculoskeletal:        General: No swelling.     Cervical back: Neck supple.  Lymphadenopathy:     Cervical: No cervical adenopathy.  Skin:    General: Skin is warm and dry.     Findings: No rash.  Neurological:     General: No focal deficit present.     Mental Status: She is alert.  Psychiatric:        Mood and Affect: Mood normal.        Behavior: Behavior normal.     LABORATORY DATA:  CBC    Component Value Date/Time   WBC 3.8 (L) 05/22/2024 0939   WBC 3.8 (L) 05/17/2024 1425   RBC 2.60 (L) 05/22/2024 0939   HGB 8.4 (L) 05/22/2024 0939   HGB 12.0 06/05/2022 1024   HCT 25.3 (L) 05/22/2024 0939   HCT 37.4 06/05/2022 1024   PLT 127 (L) 05/22/2024 0939   PLT 251 06/05/2022 1024   MCV 97.3 05/22/2024 0939   MCV 86 06/05/2022 1024   MCH 32.3 05/22/2024 0939   MCHC 33.2 05/22/2024 0939   RDW 15.5 05/22/2024 0939   RDW 15.5 (H) 06/05/2022 1024   LYMPHSABS 0.3 (L) 05/22/2024 0939   LYMPHSABS 0.9 06/05/2022 1024   MONOABS 0.5 05/22/2024 0939   EOSABS 0.1 05/22/2024 0939   EOSABS 0.1 06/05/2022 1024   BASOSABS 0.0 05/22/2024 0939   BASOSABS 0.1 06/05/2022 1024    CMP     Component Value Date/Time   NA 138 05/17/2024 1425   NA 145 (H) 06/05/2022 1024   K 4.1 05/17/2024 1425   CL 103 05/17/2024 1425   CO2 27 05/17/2024 1425   GLUCOSE 101 (H) 05/17/2024 1425   BUN 14 05/17/2024 1425   BUN 17 06/05/2022 1024   CREATININE 0.87 05/17/2024 1425   CREATININE 0.96 05/06/2024 1245   CALCIUM 8.1 (L) 05/17/2024 1425   PROT 5.6 (L) 05/17/2024 1425   PROT 6.6 06/05/2022 1024   ALBUMIN 3.0 (L) 05/17/2024 1425   ALBUMIN 4.0 06/05/2022 1024   AST 130 (H) 05/17/2024 1425   AST 94 (H) 05/06/2024 1245   ALT 34 05/17/2024 1425   ALT 31 05/06/2024 1245   ALKPHOS 217 (H) 05/17/2024 1425    BILITOT 0.5 05/17/2024 1425   BILITOT 0.4 05/06/2024 1245   GFRNONAA >60 05/17/2024 1425   GFRNONAA >60 05/06/2024 1245   GFRAA 81 02/03/2020 1049   GFRAA >60 12/19/2018 1434     ASSESSMENT and THERAPY PLAN:   No problem-specific Assessment & Plan notes found for this encounter. Assessment and Plan Assessment & Plan Metastatic breast cancer with liver involvement, bone involvement and malignant pleural effusion Started elacestrant on Oct 9 th. - Monitor response to elacestrant and adjust treatment as necessary.  - No need to continue faslodex . - Plan PET scan in mid-December to evaluate treatment efficacy. - Continue Zometa  every three months for bone mets. - Monitor for fluid accumulation symptoms and arrange drainage if shortness of breath occurs.  Cancer-related fatigue and malaise Persistent fatigue and malaise due to widespread cancer. Significant impact on quality of life. - Monitor symptoms and reassess treatment plan if condition worsens.  Cancer-related anorexia and weight loss Anorexia and weight loss likely due to cancer progression and gastrointestinal symptoms. Discussed potential for appetite stimulants if gastric symptoms are optimized. - Await GI consultation next week to optimize gastric symptoms treatment. - Consider  appetite stimulant if gastric symptoms are optimized and eating remains difficult.  Cancer-related gastrointestinal symptoms (heartburn, indigestion, oral discomfort) Gastrointestinal symptoms possibly related to cancer spread. Currently on Protonix  and mouth rinse.  Anemia secondary to malignancy Anemia likely secondary to malignancy with hemoglobin at 8.4. No significant blood count changes. - Monitor hemoglobin levels and overall blood counts.  We discussed goals of care once again very clearly. If her PS deteriorates, then we will consider transitioning to palliative care.  All questions were answered. The patient knows to call the clinic  with any problems, questions or concerns. We can certainly see the patient much sooner if necessary.  Total encounter time:40 minutes*in face-to-face visit time, chart review, lab review, care coordination, order entry, and documentation of the encounter time.   *Total Encounter Time as defined by the Centers for Medicare and Medicaid Services includes, in addition to the face-to-face time of a patient visit (documented in the note above) non-face-to-face time: obtaining and reviewing outside history, ordering and reviewing medications, tests or procedures, care coordination (communications with other health care professionals or caregivers) and documentation in the medical record.

## 2024-05-23 ENCOUNTER — Encounter: Payer: Self-pay | Admitting: Hematology and Oncology

## 2024-05-23 LAB — CANCER ANTIGEN 27.29: CA 27.29: 2308.9 U/mL — ABNORMAL HIGH (ref 0.0–38.6)

## 2024-05-26 ENCOUNTER — Other Ambulatory Visit: Payer: Self-pay | Admitting: *Deleted

## 2024-05-26 ENCOUNTER — Telehealth: Payer: Self-pay | Admitting: *Deleted

## 2024-05-26 ENCOUNTER — Other Ambulatory Visit: Payer: Self-pay

## 2024-05-26 DIAGNOSIS — J910 Malignant pleural effusion: Secondary | ICD-10-CM

## 2024-05-26 DIAGNOSIS — R0602 Shortness of breath: Secondary | ICD-10-CM

## 2024-05-26 DIAGNOSIS — C50411 Malignant neoplasm of upper-outer quadrant of right female breast: Secondary | ICD-10-CM

## 2024-05-26 NOTE — Progress Notes (Signed)
 Pt called c/o increased SOB and feeling the same as the past when she has needed a thoracentesis. Pt also spoke with oncology team who set up IR thora appt. Pt verbalized understanding on appt schedule and when to call if things feel different or worse. No further needs at this time.

## 2024-05-26 NOTE — Telephone Encounter (Signed)
 Pt called stating that she was having SOB and thought that thoracentesis would be needed. Placed orders for IR thora and DG chest xray. Gave pt contact information to centralized scheduling to schedule appts. Pt verbalized understanding.

## 2024-05-27 ENCOUNTER — Other Ambulatory Visit: Payer: Self-pay

## 2024-05-27 DIAGNOSIS — Z515 Encounter for palliative care: Secondary | ICD-10-CM

## 2024-05-27 DIAGNOSIS — G893 Neoplasm related pain (acute) (chronic): Secondary | ICD-10-CM

## 2024-05-27 DIAGNOSIS — Z170 Estrogen receptor positive status [ER+]: Secondary | ICD-10-CM

## 2024-05-27 MED ORDER — OXYCODONE HCL ER 15 MG PO T12A: 15.0000 mg | EXTENDED_RELEASE_TABLET | Freq: Two times a day (BID) | ORAL | 0 refills | Status: DC

## 2024-05-28 ENCOUNTER — Ambulatory Visit (HOSPITAL_COMMUNITY)
Admission: RE | Admit: 2024-05-28 | Discharge: 2024-05-28 | Disposition: A | Source: Ambulatory Visit | Attending: Interventional Radiology | Admitting: Interventional Radiology

## 2024-05-28 ENCOUNTER — Ambulatory Visit (HOSPITAL_COMMUNITY)
Admission: RE | Admit: 2024-05-28 | Discharge: 2024-05-28 | Disposition: A | Discharge status: Home / Self Care | Source: Ambulatory Visit | Attending: Hematology and Oncology | Admitting: Hematology and Oncology

## 2024-05-28 VITALS — BP 111/74

## 2024-05-28 DIAGNOSIS — G2581 Restless legs syndrome: Secondary | ICD-10-CM

## 2024-05-28 DIAGNOSIS — K449 Diaphragmatic hernia without obstruction or gangrene: Secondary | ICD-10-CM | POA: Diagnosis not present

## 2024-05-28 DIAGNOSIS — J9811 Atelectasis: Secondary | ICD-10-CM | POA: Diagnosis not present

## 2024-05-28 DIAGNOSIS — C50911 Malignant neoplasm of unspecified site of right female breast: Secondary | ICD-10-CM | POA: Diagnosis not present

## 2024-05-28 DIAGNOSIS — R0602 Shortness of breath: Secondary | ICD-10-CM | POA: Diagnosis not present

## 2024-05-28 DIAGNOSIS — C50411 Malignant neoplasm of upper-outer quadrant of right female breast: Secondary | ICD-10-CM

## 2024-05-28 DIAGNOSIS — E669 Obesity, unspecified: Secondary | ICD-10-CM

## 2024-05-28 DIAGNOSIS — I10 Essential (primary) hypertension: Secondary | ICD-10-CM

## 2024-05-28 DIAGNOSIS — J910 Malignant pleural effusion: Secondary | ICD-10-CM | POA: Diagnosis not present

## 2024-05-28 DIAGNOSIS — F418 Other specified anxiety disorders: Secondary | ICD-10-CM

## 2024-05-28 DIAGNOSIS — K31819 Angiodysplasia of stomach and duodenum without bleeding: Secondary | ICD-10-CM

## 2024-05-28 DIAGNOSIS — F5102 Adjustment insomnia: Secondary | ICD-10-CM

## 2024-05-28 DIAGNOSIS — K921 Melena: Secondary | ICD-10-CM

## 2024-05-28 DIAGNOSIS — R600 Localized edema: Secondary | ICD-10-CM

## 2024-05-28 DIAGNOSIS — I700 Atherosclerosis of aorta: Secondary | ICD-10-CM

## 2024-05-28 DIAGNOSIS — M129 Arthropathy, unspecified: Secondary | ICD-10-CM

## 2024-05-28 DIAGNOSIS — Z95828 Presence of other vascular implants and grafts: Secondary | ICD-10-CM

## 2024-05-28 DIAGNOSIS — Z48813 Encounter for surgical aftercare following surgery on the respiratory system: Secondary | ICD-10-CM | POA: Diagnosis not present

## 2024-05-28 DIAGNOSIS — D62 Acute posthemorrhagic anemia: Secondary | ICD-10-CM

## 2024-05-28 DIAGNOSIS — J90 Pleural effusion, not elsewhere classified: Secondary | ICD-10-CM | POA: Diagnosis not present

## 2024-05-28 DIAGNOSIS — E440 Moderate protein-calorie malnutrition: Secondary | ICD-10-CM

## 2024-05-28 HISTORY — PX: IR THORACENTESIS ASP PLEURAL SPACE W/IMG GUIDE: IMG5380

## 2024-05-28 MED ORDER — LIDOCAINE HCL 1 % IJ SOLN
20.0000 mL | Freq: Once | INTRAMUSCULAR | Status: AC
  Administered 2024-05-28: 10 mL via INTRADERMAL

## 2024-05-28 MED ORDER — LIDOCAINE HCL 1 % IJ SOLN
INTRAMUSCULAR | Status: AC
  Filled 2024-05-28: qty 20

## 2024-05-28 NOTE — Procedures (Signed)
 PROCEDURE SUMMARY:  Successful US  guided right-sided thoracentesis. Yielded of serous fluid. Patient tolerated procedure well. No immediate complications. EBL = trace  Post procedure chest X-ray reveals no pneumothorax  Varvara Legault CHRISTELLA Bal PA-C 05/28/2024 10:46 AM

## 2024-05-29 DIAGNOSIS — K080 Exfoliation of teeth due to systemic causes: Secondary | ICD-10-CM | POA: Diagnosis not present

## 2024-05-29 NOTE — Progress Notes (Signed)
 Chief Complaint: Heartburn  HPI:    Doris Smith is a 73 year old female, known to Dr. Shila, with a past medical history as listed below including A-fib, stage IV breast cancer and palliative care, who was referred to me by Gladis, [name], * for a complaint of heartburn.     10/06/2022 PET scan with widespread osseous metastasis.    08/17/2023 EGD done for melena with erosive gastropathy and recent stigmata of bleeding, gastric antral vascular ectasia treated with APC.  Large hiatal hernia distorting gastric anatomy.  Recommended sucralfate  after ablation of GAVE.  Biopsy showed metastatic poorly differentiated adenocarcinoma consistent with breast primary.    09/17/2023 echo with LVEF 60-65% and otherwise normal.    05/19/2024 patient called and discussed reflux.  She was taking Pantoprazole  40 mg twice daily.  At that time added Carafate .    05/22/2024 CBC with pain and cytopenia, WBC 3.8, hemoglobin 8.4, RBC 2.6, platelets 127.  CMP with glucose of 124, albumin and total protein calcium low.  AST 116.  Alk phos 206.    05/22/2024 patient followed with oncology    05/28/2024 IR thoracentesis with removal of 600 mL of fluid.    Today, patient presents to clinic accompanied by her family.  She says that she is in palliative care for stage IV breast cancer but she does not want to live out her days with heartburn.  Describes that everything she eats or drinks seems to burn, in the morning she wakes up and takes milk with her medicines which seems to help a little bit but throughout the day she suffers with heartburn.  This is regardless of using her Pantoprazole  40 mg twice a day and the addition of Carafate  which she has been able to fit in 3 times a day, though not watching timing of this and taking it with her Pantoprazole  in the morning and in the evening.  Tells me she would like something else to help with this.    Also describes constipation, apparently using MiraLAX occasionally which does  help.  Nothing consistent.    Denies fever, chills or symptoms that awaken her from sleep.  Past Medical History:  Diagnosis Date   A-fib (HCC) 11/2012   PAF   Anxiety    takes Ativan    Asthma 04/07/2011   dx   Bipolar disorder (HCC)    Cancer (HCC)    Right breast   Depression    Early cataracts, bilateral    Fatty liver    Fibromyalgia    GERD (gastroesophageal reflux disease)    Irritable bowel syndrome    Mental disorder    dx bipolar   PONV (postoperative nausea and vomiting)    Sleep apnea 04/2011   does not wear CPAP   Tardive dyskinesia     Past Surgical History:  Procedure Laterality Date   ABDOMINAL HYSTERECTOMY     BIOPSY  08/17/2023   Procedure: BIOPSY;  Surgeon: Avram Lupita BRAVO, MD;  Location: [facility];  Service: Gastroenterology;;   COLONOSCOPY     DILATION AND CURETTAGE OF UTERUS  1981   abnormal pap   ESOPHAGOGASTRODUODENOSCOPY (EGD) WITH PROPOFOL  N/A 08/17/2023   Procedure: ESOPHAGOGASTRODUODENOSCOPY (EGD) WITH PROPOFOL ;  Surgeon: Avram Lupita BRAVO, MD;  Location: [facility];  Service: Gastroenterology;  Laterality: N/A;   HOT HEMOSTASIS N/A 08/17/2023   Procedure: HOT HEMOSTASIS (ARGON PLASMA COAGULATION/BICAP);  Surgeon: Avram Lupita BRAVO, MD;  Location: [facility];  Service: Gastroenterology;  Laterality: N/A;   IR IMAGING GUIDED  PORT INSERTION  11/20/2023   IR THORACENTESIS ASP PLEURAL SPACE W/IMG GUIDE  09/13/2023   IR THORACENTESIS ASP PLEURAL SPACE W/IMG GUIDE  11/20/2023   IR THORACENTESIS ASP PLEURAL SPACE W/IMG GUIDE  03/20/2024   IR THORACENTESIS ASP PLEURAL SPACE W/IMG GUIDE  05/06/2024   IR THORACENTESIS ASP PLEURAL SPACE W/IMG GUIDE  05/28/2024   KNEE ARTHROSCOPY Left 2007   x 2   LUMBAR LAMINECTOMY/DECOMPRESSION MICRODISCECTOMY  05/31/2011   Procedure: LUMBAR LAMINECTOMY/DECOMPRESSION MICRODISCECTOMY;  Surgeon: Reyes JAYSON Billing;  Location: [facility];  Service: Orthopedics;  Laterality: Left;  Decompression Lumbar four to five and  Lumbar five to  Sacral one on Left  (X-Ray)   MASTECTOMY W/ SENTINEL NODE BIOPSY Right 09/16/2018   Procedure: RIGHT MASTECTOMY WITH RIGHT AXILLARY SENTINEL LYMPH NODE BIOPSY;  Surgeon: Ethyl Lenis, MD;  Location: [facility];  Service: General;  Laterality: Right;   PARTIAL HYSTERECTOMY  1982   PORTACATH PLACEMENT Left 10/31/2018   Procedure: INSERTION PORT-A-CATH WITH ULTRASOUND;  Surgeon: Ethyl Lenis, MD;  Location: [facility];  Service: General;  Laterality: Left;   TONSILLECTOMY     as child   TOTAL KNEE ARTHROPLASTY Left 12/18/2005    Current Outpatient Medications  Medication Sig Dispense Refill   cyanocobalamin  1000 MCG tablet Take 1 tablet (1,000 mcg total) by mouth daily. 60 tablet 0   elacestrant  hydrochloride (ORSERDU ) 345 MG tablet Take 1 tablet (345 mg total) by mouth daily. Take with food. 30 tablet 1   Incontinence Supply Disposable (CERTAINTY FITTED BRIEFS XL) MISC 1 Package by Does not apply route as needed. 56 each 3   lidocaine  (LIDODERM ) 5 % Place 2 patches onto the skin daily. Remove & Discard patch within 12 hours or as directed by MD 60 patch 2   lidocaine -prilocaine  (EMLA ) cream Apply 1 Application topically as needed. Apply to port site 30-45 min prior to appointment. 30 g 3   LORazepam  (ATIVAN ) 1 MG tablet Take 1 tablet (1 mg total) by mouth 2 (two) times daily as needed for anxiety. 60 tablet 5   magic mouthwash (nystatin , diphenhydrAMINE , alum & mag hydroxide) suspension mixture Swish and swallow 5 mLs three times daily 150 mL 1   OLANZapine  (ZYPREXA ) 5 MG tablet Take 1 tablet (5 mg total) by mouth at bedtime. 30 tablet 0   ondansetron  (ZOFRAN ) 8 MG tablet TAKE 1 TABLET BY MOUTH EVERY 8 HOURS AS NEEDED FOR NAUSEA FOR VOMITING 30 tablet 0   oxyCODONE  (OXYCONTIN ) 15 mg 12 hr tablet Take 1 tablet (15 mg total) by mouth every 12 (twelve) hours. 60 tablet 0   oxyCODONE -acetaminophen  (PERCOCET) 10-325 MG tablet Take 1 tablet by mouth every 4 (four) hours as needed for pain. 60  tablet 0   pantoprazole  (PROTONIX ) 40 MG tablet Take 1 tablet (40 mg total) by mouth 2 (two) times daily before a meal. 180 tablet 1   polyethylene glycol (MIRALAX / GLYCOLAX) 17 g packet Take 17 g by mouth daily.     pramipexole  (MIRAPEX ) 1 MG tablet Take 1 tablet (1 mg total) by mouth 3 (three) times daily. 90 tablet 1   prochlorperazine  (COMPAZINE ) 10 MG tablet Take 1 tablet (10 mg total) by mouth every 6 (six) hours as needed for nausea or vomiting. 30 tablet 2   sennosides-docusate sodium  (SENOKOT-S) 8.6-50 MG tablet Take 1 tablet by mouth daily.     sucralfate  (CARAFATE ) 1 GM/10ML suspension Take 10 mLs (1 g total) by mouth 4 (four) times daily -  with  meals and at bedtime. 420 mL 1   No current facility-administered medications for this visit.    Allergies as of 05/30/2024 - Review Complete 05/28/2024  Allergen Reaction Noted   Iohexol  11/06/2012   Codeine Other (See Comments) 05/14/2008   Palonosetron  Other (See Comments) 04/16/2019   Prednisone  Other (See Comments) 12/18/2017   Sulfonamide derivatives Other (See Comments) 05/14/2008   Celecoxib Nausea And Vomiting 12/10/2015   Sulfa antibiotics Nausea And Vomiting 09/11/2015   Vitamin b12 Rash 11/05/2012    Family History  Problem Relation Age of Onset   Anesthesia problems Mother    Heart disease Mother        CHF, atrial fib   Heart failure Mother    Anesthesia problems Sister    Colon polyps Father    Heart disease Father        Fluid around the heart   Hypertension Sister    Breast cancer Maternal Aunt    Colon cancer Maternal Aunt    Prostate cancer Neg Hx    Ovarian cancer Neg Hx     Social History   Socioeconomic History   Marital status: Married    Spouse name: Lynwood   Number of children: 2   Years of education: 12   Highest education level: Not on file  Occupational History   Occupation: Disabled  Tobacco Use   Smoking status: Never   Smokeless tobacco: Never  Vaping Use   Vaping status:  Never Used  Substance and Sexual Activity   Alcohol use: No   Drug use: No   Sexual activity: Yes    Birth control/protection: Post-menopausal, Surgical  Other Topics Concern   Not on file  Social History Narrative   Tea daily.  Rarely has caffeine    Social Drivers of Corporate Investment Banker Strain: Patient Declined (10/22/2023)   Overall Financial Resource Strain (CARDIA)    Difficulty of Paying Living Expenses: Patient declined  Food Insecurity: Patient Declined (10/22/2023)   Hunger Vital Sign    Worried About Running Out of Food in the Last Year: Patient declined    Ran Out of Food in the Last Year: Patient declined  Transportation Needs: No Transportation Needs (10/22/2023)   PRAPARE - Administrator, Civil Service (Medical): No    Lack of Transportation (Non-Medical): No  Physical Activity: Unknown (10/22/2023)   Exercise Vital Sign    Days of Exercise per Week: 0 days    Minutes of Exercise per Session: Not on file  Stress: Stress Concern Present (10/22/2023)   [employer] of Occupational Health - Occupational Stress Questionnaire    Feeling of Stress : To some extent  Social Connections: Unknown (10/22/2023)   Social Connection and Isolation Panel    Frequency of Communication with Friends and Family: More than three times a week    Frequency of Social Gatherings with Friends and Family: Twice a week    Attends Religious Services: Patient declined    Database Administrator or Organizations: No    Attends Banker Meetings: Never    Marital Status: Married  Catering Manager Violence: Not At Risk (08/16/2023)   Humiliation, Afraid, Rape, and Kick questionnaire    Fear of Current or Ex-Partner: No    Emotionally Abused: No    Physically Abused: No    Sexually Abused: No    Review of Systems:    Constitutional: No  fever or chills Cardiovascular: No chest pain Respiratory: No SOB  Gastrointestinal: See HPI and otherwise negative    Physical Exam:  Vital signs: BP 104/60 (BP Location: Left Arm, Patient Position: Sitting, Cuff Size: Normal)   Pulse 81   Ht 5' 4 (1.626 m)   Wt 138 lb (62.6 kg)   BMI 23.69 kg/m    Constitutional:   Pleasant Caucasian female appears to be in NAD, Well developed, Well nourished, alert and cooperative Respiratory: Respirations even and unlabored. Lungs clear to auscultation bilaterally.   No wheezes, crackles, or rhonchi.  Cardiovascular: Normal S1, S2. No MRG. Regular rate and rhythm. No peripheral edema, cyanosis or pallor.  Gastrointestinal:  Soft, nondistended, moderate epigastric TTP. No rebound or guarding. Normal bowel sounds. No appreciable masses or hepatomegaly. Rectal:  Not performed.  Psychiatric: Demonstrates good judgement and reason without abnormal affect or behaviors.  RELEVANT LABS AND IMAGING: CBC    Component Value Date/Time   WBC 3.8 (L) 05/22/2024 0939   WBC 3.8 (L) 05/17/2024 1425   RBC 2.60 (L) 05/22/2024 0939   HGB 8.4 (L) 05/22/2024 0939   HGB 12.0 06/05/2022 1024   HCT 25.3 (L) 05/22/2024 0939   HCT 37.4 06/05/2022 1024   PLT 127 (L) 05/22/2024 0939   PLT 251 06/05/2022 1024   MCV 97.3 05/22/2024 0939   MCV 86 06/05/2022 1024   MCH 32.3 05/22/2024 0939   MCHC 33.2 05/22/2024 0939   RDW 15.5 05/22/2024 0939   RDW 15.5 (H) 06/05/2022 1024   LYMPHSABS 0.3 (L) 05/22/2024 0939   LYMPHSABS 0.9 06/05/2022 1024   MONOABS 0.5 05/22/2024 0939   EOSABS 0.1 05/22/2024 0939   EOSABS 0.1 06/05/2022 1024   BASOSABS 0.0 05/22/2024 0939   BASOSABS 0.1 06/05/2022 1024    CMP     Component Value Date/Time   NA 139 05/22/2024 0939   NA 145 (H) 06/05/2022 1024   K 3.8 05/22/2024 0939   CL 105 05/22/2024 0939   CO2 28 05/22/2024 0939   GLUCOSE 124 (H) 05/22/2024 0939   BUN 12 05/22/2024 0939   BUN 17 06/05/2022 1024   CREATININE 0.89 05/22/2024 0939   CALCIUM 7.7 (L) 05/22/2024 0939   PROT 5.4 (L) 05/22/2024 0939   PROT 6.6 06/05/2022 1024   ALBUMIN 2.9  (L) 05/22/2024 0939   ALBUMIN 4.0 06/05/2022 1024   AST 116 (H) 05/22/2024 0939   ALT 28 05/22/2024 0939   ALKPHOS 206 (H) 05/22/2024 0939   BILITOT 0.5 05/22/2024 0939   GFRNONAA >60 05/22/2024 0939   GFRAA 81 02/03/2020 1049   GFRAA >60 12/19/2018 1434    Assessment: 1.  Heartburn: In the setting of known large hiatal hernia and stomach cancer on last EGD in February spread from primary breast cancer, not to a candidate for fundoplication due to being on palliative care for stage IV breast cancer, breakthrough symptoms on Pantoprazole  40 twice daily and Carafate  3 times daily 2.  Stage IV breast cancer: On palliative care 3.  Adenocarcinoma of the stomach: Discovered on EGD in February, breast cancer primary 4.  Constipation: Worse since patient has been treated for breast cancer and is on lots of medications; likely med side effect  Plan: 1.  Discussed with patient that her symptoms are likely multifactorial.  Will try to maximize her therapy to give her some relief.  Continue Pantoprazole  40 mg twice daily, 30 minutes for breakfast and dinner. 2.  Added Pepcid  40 mg every morning and nightly #60 with 5 refills. 3.  Provided her with GI cocktail, can add  5 to 10 mL every 4-6 hours as needed for breakthrough symptoms 4.  Discussed continuing Carafate  and trying to fit it in 4 times daily, but it needs to be an hour before 2 hours after other medications. 5.  Reviewed antireflux diet and lifestyle modifications. 6.  Recommend the patient use MiraLAX on a daily basis for constipation symptoms 6.  Patient to follow in clinic with me in 2 to 3 months or sooner if necessary.  She will let me know through MyChart how she is doing.  Delon Failing, PA-C [facility] 05/29/2024, 11:27 AM  Cc: [name], *

## 2024-05-30 ENCOUNTER — Ambulatory Visit: Admitting: Physician Assistant

## 2024-05-30 ENCOUNTER — Other Ambulatory Visit: Payer: Self-pay | Admitting: *Deleted

## 2024-05-30 ENCOUNTER — Telehealth: Payer: Self-pay | Admitting: *Deleted

## 2024-05-30 ENCOUNTER — Encounter: Payer: Self-pay | Admitting: Physician Assistant

## 2024-05-30 VITALS — BP 104/60 | HR 81 | Ht 64.0 in | Wt 138.0 lb

## 2024-05-30 DIAGNOSIS — K219 Gastro-esophageal reflux disease without esophagitis: Secondary | ICD-10-CM

## 2024-05-30 DIAGNOSIS — C50411 Malignant neoplasm of upper-outer quadrant of right female breast: Secondary | ICD-10-CM | POA: Diagnosis not present

## 2024-05-30 DIAGNOSIS — I4891 Unspecified atrial fibrillation: Secondary | ICD-10-CM

## 2024-05-30 DIAGNOSIS — Z170 Estrogen receptor positive status [ER+]: Secondary | ICD-10-CM

## 2024-05-30 DIAGNOSIS — K31819 Angiodysplasia of stomach and duodenum without bleeding: Secondary | ICD-10-CM

## 2024-05-30 MED ORDER — ELACESTRANT HYDROCHLORIDE 345 MG PO TABS: 345.0000 mg | ORAL_TABLET | Freq: Every day | ORAL | 1 refills | Status: DC

## 2024-05-30 MED ORDER — FAMOTIDINE 40 MG PO TABS: 40.0000 mg | ORAL_TABLET | Freq: Every day | ORAL | 5 refills | Status: DC

## 2024-05-30 MED ORDER — SUCRALFATE 1 GM/10ML PO SUSP
1.0000 g | Freq: Three times a day (TID) | ORAL | 1 refills | Status: AC
Start: 2024-05-30 — End: ?

## 2024-05-30 MED ORDER — AMBULATORY NON FORMULARY MEDICATION: 0 refills | Status: DC

## 2024-05-30 NOTE — Patient Instructions (Addendum)
 We have sent the following medications to your pharmacy for you to pick up at your convenience: Carafate  10 ml  four times daily 1 hour or 2 hours after other medications. Pepcid  40 mg twice daily. GI Cocktail - 5-10 ml every 4-6 hours as needed.   Start Miralax 1 capful daily in 8 ounces of liquid.

## 2024-05-30 NOTE — Telephone Encounter (Signed)
 Pt called and left vm with c/o dry heaves and not feeling well as if she was deteriorating. Called pt back and left vm for pt to call back get further information and see if symptoms can be treated her at [facility] or if she needed further evaluation at the ER. Message was passed on to pharm MD to f/u and office will also wait for pt to call back to evaluate further.

## 2024-06-02 ENCOUNTER — Telehealth: Payer: Self-pay

## 2024-06-02 NOTE — Telephone Encounter (Signed)
 Pt called c/o nausea and dry heaves. She states. I dont know how I can live like this.  Pt states she did take her Zofran  this morning and denies having anything for breakfast.  Pt was educated on important of nutrition before all of her meds. Advised pt to try and take a Zofran  tomorrow morning when she first wakes up, eat breakfast, then take the rest of her morning medications. She ad her husband are agreeable to this plan and know I will call tomorrow to check and see if this was effective. She was instructed to also take her compazine  as prescribed. She is agreeable.

## 2024-06-04 ENCOUNTER — Telehealth: Payer: Self-pay

## 2024-06-04 ENCOUNTER — Encounter: Payer: Self-pay | Admitting: Hematology and Oncology

## 2024-06-04 NOTE — Telephone Encounter (Signed)
 Pt reports she is feeling better and that the recommendation for scheduled zofran  has really helped her. She expressed gratitude for the advice. She knows to call with any concerns.

## 2024-06-04 NOTE — Telephone Encounter (Signed)
 Pt called and reported it was a bad day for her N/V and acid reflux. RN went through medications with pt room to take compazine , pt educated on it. Pt also instructed to call GI office as they could not pick up one of the medications sent in. Pt dn husband lynwood verbalized understanding. No further needs at this time.

## 2024-06-09 NOTE — Telephone Encounter (Signed)
 Sonny, looks like I helped with this patient on 05/30/24. Do you know where this GI cocktail was faxed?

## 2024-06-12 ENCOUNTER — Other Ambulatory Visit (HOSPITAL_COMMUNITY): Payer: Self-pay

## 2024-06-12 ENCOUNTER — Encounter: Payer: Self-pay | Admitting: Hematology and Oncology

## 2024-06-12 MED ORDER — AMBULATORY NON FORMULARY MEDICATION: 0 refills | Status: DC

## 2024-06-12 MED ORDER — LIDOCAINE VISCOUS HCL 2 % MT SOLN
5.0000 mL | OROMUCOSAL | 0 refills | Status: DC | PRN
  Filled 2024-06-12: qty 450, 8d supply, fill #0

## 2024-06-12 NOTE — Addendum Note (Signed)
 Addended by: WILL POWELL CROME on: 06/12/2024 08:42 AM   Modules accepted: Orders

## 2024-06-12 NOTE — Telephone Encounter (Signed)
 Spoke with patient and she would like GI cocktail sent to [facility].  Script faxed.

## 2024-06-12 NOTE — Telephone Encounter (Signed)
 Left message for patient to call office. [facility] can mix Gi cocktail if she would like to pick it up there or any [facility] outpatient pharmacy.

## 2024-06-16 ENCOUNTER — Other Ambulatory Visit (HOSPITAL_COMMUNITY): Payer: Self-pay

## 2024-06-18 ENCOUNTER — Other Ambulatory Visit (HOSPITAL_COMMUNITY): Payer: Self-pay

## 2024-06-18 ENCOUNTER — Other Ambulatory Visit: Payer: Self-pay

## 2024-06-18 DIAGNOSIS — C50411 Malignant neoplasm of upper-outer quadrant of right female breast: Secondary | ICD-10-CM

## 2024-06-19 ENCOUNTER — Inpatient Hospital Stay: Admitting: Nurse Practitioner

## 2024-06-19 ENCOUNTER — Inpatient Hospital Stay

## 2024-06-19 ENCOUNTER — Inpatient Hospital Stay: Attending: Hematology and Oncology | Admitting: Hematology and Oncology

## 2024-06-19 ENCOUNTER — Encounter (HOSPITAL_COMMUNITY): Payer: Self-pay

## 2024-06-19 ENCOUNTER — Inpatient Hospital Stay (HOSPITAL_COMMUNITY)
Admission: EM | Admit: 2024-06-19 | Discharge: 2024-06-26 | DRG: 640 | Disposition: A | Discharge status: Hospice | Source: Ambulatory Visit | Attending: Internal Medicine | Admitting: Internal Medicine

## 2024-06-19 ENCOUNTER — Other Ambulatory Visit: Payer: Self-pay

## 2024-06-19 VITALS — BP 126/57 | HR 82 | Temp 98.2°F | Resp 18 | Wt 141.7 lb

## 2024-06-19 DIAGNOSIS — Z1721 Progesterone receptor positive status: Secondary | ICD-10-CM

## 2024-06-19 DIAGNOSIS — H5702 Anisocoria: Secondary | ICD-10-CM | POA: Diagnosis not present

## 2024-06-19 DIAGNOSIS — R112 Nausea with vomiting, unspecified: Secondary | ICD-10-CM

## 2024-06-19 DIAGNOSIS — C50411 Malignant neoplasm of upper-outer quadrant of right female breast: Secondary | ICD-10-CM | POA: Diagnosis not present

## 2024-06-19 DIAGNOSIS — N179 Acute kidney failure, unspecified: Secondary | ICD-10-CM | POA: Diagnosis present

## 2024-06-19 DIAGNOSIS — K760 Fatty (change of) liver, not elsewhere classified: Secondary | ICD-10-CM | POA: Diagnosis present

## 2024-06-19 DIAGNOSIS — I129 Hypertensive chronic kidney disease with stage 1 through stage 4 chronic kidney disease, or unspecified chronic kidney disease: Secondary | ICD-10-CM | POA: Diagnosis present

## 2024-06-19 DIAGNOSIS — J910 Malignant pleural effusion: Secondary | ICD-10-CM | POA: Diagnosis present

## 2024-06-19 DIAGNOSIS — L89312 Pressure ulcer of right buttock, stage 2: Secondary | ICD-10-CM | POA: Diagnosis present

## 2024-06-19 DIAGNOSIS — E876 Hypokalemia: Secondary | ICD-10-CM | POA: Diagnosis present

## 2024-06-19 DIAGNOSIS — D631 Anemia in chronic kidney disease: Secondary | ICD-10-CM | POA: Diagnosis present

## 2024-06-19 DIAGNOSIS — G9341 Metabolic encephalopathy: Secondary | ICD-10-CM | POA: Diagnosis present

## 2024-06-19 DIAGNOSIS — G8929 Other chronic pain: Secondary | ICD-10-CM | POA: Diagnosis present

## 2024-06-19 DIAGNOSIS — R52 Pain, unspecified: Principal | ICD-10-CM

## 2024-06-19 DIAGNOSIS — R627 Adult failure to thrive: Secondary | ICD-10-CM | POA: Diagnosis present

## 2024-06-19 DIAGNOSIS — Z66 Do not resuscitate: Secondary | ICD-10-CM | POA: Diagnosis present

## 2024-06-19 DIAGNOSIS — K589 Irritable bowel syndrome without diarrhea: Secondary | ICD-10-CM | POA: Diagnosis present

## 2024-06-19 DIAGNOSIS — E8770 Fluid overload, unspecified: Secondary | ICD-10-CM | POA: Diagnosis present

## 2024-06-19 DIAGNOSIS — Z170 Estrogen receptor positive status [ER+]: Secondary | ICD-10-CM

## 2024-06-19 DIAGNOSIS — Z91041 Radiographic dye allergy status: Secondary | ICD-10-CM

## 2024-06-19 DIAGNOSIS — D849 Immunodeficiency, unspecified: Secondary | ICD-10-CM | POA: Diagnosis present

## 2024-06-19 DIAGNOSIS — R7989 Other specified abnormal findings of blood chemistry: Secondary | ICD-10-CM | POA: Diagnosis present

## 2024-06-19 DIAGNOSIS — I480 Paroxysmal atrial fibrillation: Secondary | ICD-10-CM | POA: Diagnosis present

## 2024-06-19 DIAGNOSIS — N182 Chronic kidney disease, stage 2 (mild): Secondary | ICD-10-CM | POA: Diagnosis present

## 2024-06-19 DIAGNOSIS — I700 Atherosclerosis of aorta: Secondary | ICD-10-CM | POA: Diagnosis present

## 2024-06-19 DIAGNOSIS — W07XXXA Fall from chair, initial encounter: Secondary | ICD-10-CM | POA: Diagnosis not present

## 2024-06-19 DIAGNOSIS — Z1732 Human epidermal growth factor receptor 2 negative status: Secondary | ICD-10-CM

## 2024-06-19 DIAGNOSIS — Z90710 Acquired absence of both cervix and uterus: Secondary | ICD-10-CM

## 2024-06-19 DIAGNOSIS — E43 Unspecified severe protein-calorie malnutrition: Secondary | ICD-10-CM | POA: Diagnosis present

## 2024-06-19 DIAGNOSIS — Z6825 Body mass index (BMI) 25.0-25.9, adult: Secondary | ICD-10-CM

## 2024-06-19 DIAGNOSIS — F319 Bipolar disorder, unspecified: Secondary | ICD-10-CM | POA: Diagnosis present

## 2024-06-19 DIAGNOSIS — H409 Unspecified glaucoma: Secondary | ICD-10-CM | POA: Diagnosis present

## 2024-06-19 DIAGNOSIS — C787 Secondary malignant neoplasm of liver and intrahepatic bile duct: Secondary | ICD-10-CM | POA: Diagnosis present

## 2024-06-19 DIAGNOSIS — D61818 Other pancytopenia: Secondary | ICD-10-CM | POA: Diagnosis present

## 2024-06-19 DIAGNOSIS — G2581 Restless legs syndrome: Secondary | ICD-10-CM | POA: Diagnosis present

## 2024-06-19 DIAGNOSIS — R54 Age-related physical debility: Secondary | ICD-10-CM | POA: Diagnosis present

## 2024-06-19 DIAGNOSIS — I959 Hypotension, unspecified: Secondary | ICD-10-CM | POA: Diagnosis not present

## 2024-06-19 DIAGNOSIS — J45909 Unspecified asthma, uncomplicated: Secondary | ICD-10-CM | POA: Diagnosis present

## 2024-06-19 DIAGNOSIS — G893 Neoplasm related pain (acute) (chronic): Secondary | ICD-10-CM | POA: Diagnosis present

## 2024-06-19 DIAGNOSIS — Z90711 Acquired absence of uterus with remaining cervical stump: Secondary | ICD-10-CM

## 2024-06-19 DIAGNOSIS — Z882 Allergy status to sulfonamides status: Secondary | ICD-10-CM

## 2024-06-19 DIAGNOSIS — M549 Dorsalgia, unspecified: Secondary | ICD-10-CM | POA: Diagnosis present

## 2024-06-19 DIAGNOSIS — Z515 Encounter for palliative care: Secondary | ICD-10-CM

## 2024-06-19 DIAGNOSIS — D630 Anemia in neoplastic disease: Secondary | ICD-10-CM | POA: Diagnosis present

## 2024-06-19 DIAGNOSIS — Z8711 Personal history of peptic ulcer disease: Secondary | ICD-10-CM

## 2024-06-19 DIAGNOSIS — Z803 Family history of malignant neoplasm of breast: Secondary | ICD-10-CM

## 2024-06-19 DIAGNOSIS — Z79899 Other long term (current) drug therapy: Secondary | ICD-10-CM

## 2024-06-19 DIAGNOSIS — E039 Hypothyroidism, unspecified: Secondary | ICD-10-CM | POA: Diagnosis present

## 2024-06-19 DIAGNOSIS — C50919 Malignant neoplasm of unspecified site of unspecified female breast: Secondary | ICD-10-CM

## 2024-06-19 DIAGNOSIS — F419 Anxiety disorder, unspecified: Secondary | ICD-10-CM | POA: Diagnosis present

## 2024-06-19 DIAGNOSIS — G4730 Sleep apnea, unspecified: Secondary | ICD-10-CM | POA: Diagnosis present

## 2024-06-19 DIAGNOSIS — E8809 Other disorders of plasma-protein metabolism, not elsewhere classified: Secondary | ICD-10-CM | POA: Diagnosis present

## 2024-06-19 DIAGNOSIS — M797 Fibromyalgia: Secondary | ICD-10-CM | POA: Diagnosis present

## 2024-06-19 DIAGNOSIS — K219 Gastro-esophageal reflux disease without esophagitis: Secondary | ICD-10-CM | POA: Diagnosis present

## 2024-06-19 DIAGNOSIS — Z96652 Presence of left artificial knee joint: Secondary | ICD-10-CM | POA: Diagnosis present

## 2024-06-19 DIAGNOSIS — Z8249 Family history of ischemic heart disease and other diseases of the circulatory system: Secondary | ICD-10-CM

## 2024-06-19 DIAGNOSIS — Z888 Allergy status to other drugs, medicaments and biological substances status: Secondary | ICD-10-CM

## 2024-06-19 DIAGNOSIS — L89156 Pressure-induced deep tissue damage of sacral region: Secondary | ICD-10-CM | POA: Diagnosis present

## 2024-06-19 DIAGNOSIS — C7951 Secondary malignant neoplasm of bone: Secondary | ICD-10-CM | POA: Diagnosis present

## 2024-06-19 DIAGNOSIS — Z79890 Hormone replacement therapy (postmenopausal): Secondary | ICD-10-CM

## 2024-06-19 DIAGNOSIS — E035 Myxedema coma: Secondary | ICD-10-CM | POA: Diagnosis not present

## 2024-06-19 DIAGNOSIS — R188 Other ascites: Secondary | ICD-10-CM | POA: Diagnosis present

## 2024-06-19 DIAGNOSIS — Z83719 Family history of colon polyps, unspecified: Secondary | ICD-10-CM

## 2024-06-19 DIAGNOSIS — Z9011 Acquired absence of right breast and nipple: Secondary | ICD-10-CM

## 2024-06-19 DIAGNOSIS — R509 Fever, unspecified: Secondary | ICD-10-CM | POA: Diagnosis not present

## 2024-06-19 DIAGNOSIS — Z800 Family history of malignant neoplasm of digestive organs: Secondary | ICD-10-CM

## 2024-06-19 DIAGNOSIS — H538 Other visual disturbances: Secondary | ICD-10-CM | POA: Diagnosis not present

## 2024-06-19 LAB — CMP (CANCER CENTER ONLY)
ALT: 37 U/L (ref 0–44)
AST: 241 U/L (ref 15–41)
Albumin: 2.8 g/dL — ABNORMAL LOW (ref 3.5–5.0)
Alkaline Phosphatase: 398 U/L — ABNORMAL HIGH (ref 38–126)
Anion gap: 11 (ref 5–15)
BUN: 16 mg/dL (ref 8–23)
CO2: 24 mmol/L (ref 22–32)
Calcium: 7.9 mg/dL — ABNORMAL LOW (ref 8.9–10.3)
Chloride: 101 mmol/L (ref 98–111)
Creatinine: 1.04 mg/dL — ABNORMAL HIGH (ref 0.44–1.00)
GFR, Estimated: 57 mL/min — ABNORMAL LOW (ref >60)
Glucose, Bld: 95 mg/dL (ref 70–99)
Potassium: 3.9 mmol/L (ref 3.5–5.1)
Sodium: 136 mmol/L (ref 135–145)
Total Bilirubin: 0.9 mg/dL (ref 0.0–1.2)
Total Protein: 5.5 g/dL — ABNORMAL LOW (ref 6.5–8.1)

## 2024-06-19 LAB — CBC WITH DIFFERENTIAL (CANCER CENTER ONLY)
Abs Immature Granulocytes: 0.05 K/uL (ref 0.00–0.07)
Basophils Absolute: 0 K/uL (ref 0.0–0.1)
Basophils Relative: 1 %
Eosinophils Absolute: 0 K/uL (ref 0.0–0.5)
Eosinophils Relative: 1 %
HCT: 25 % — ABNORMAL LOW (ref 36.0–46.0)
Hemoglobin: 8.4 g/dL — ABNORMAL LOW (ref 12.0–15.0)
Immature Granulocytes: 1 %
Lymphocytes Relative: 10 %
Lymphs Abs: 0.4 K/uL — ABNORMAL LOW (ref 0.7–4.0)
MCH: 31.6 pg (ref 26.0–34.0)
MCHC: 33.6 g/dL (ref 30.0–36.0)
MCV: 94 fL (ref 80.0–100.0)
Monocytes Absolute: 0.5 K/uL (ref 0.1–1.0)
Monocytes Relative: 11 %
Neutro Abs: 3.2 K/uL (ref 1.7–7.7)
Neutrophils Relative %: 76 %
Platelet Count: 92 K/uL — ABNORMAL LOW (ref 150–400)
RBC: 2.66 MIL/uL — ABNORMAL LOW (ref 3.87–5.11)
RDW: 17.3 % — ABNORMAL HIGH (ref 11.5–15.5)
WBC Count: 4.3 K/uL (ref 4.0–10.5)
nRBC: 0.7 % — ABNORMAL HIGH (ref 0.0–0.2)

## 2024-06-19 LAB — SAMPLE TO BLOOD BANK

## 2024-06-19 MED ORDER — OXYCODONE HCL ER 15 MG PO T12A
15.0000 mg | EXTENDED_RELEASE_TABLET | Freq: Two times a day (BID) | ORAL | Status: DC
  Administered 2024-06-19 – 2024-06-26 (×13): 15 mg via ORAL
  Filled 2024-06-19 (×14): qty 1

## 2024-06-19 MED ORDER — ENSURE PLUS HIGH PROTEIN PO LIQD
237.0000 mL | Freq: Two times a day (BID) | ORAL | Status: DC
  Administered 2024-06-20 – 2024-06-23 (×6): 237 mL via ORAL

## 2024-06-19 MED ORDER — OLANZAPINE 5 MG PO TABS
5.0000 mg | ORAL_TABLET | Freq: Every day | ORAL | Status: DC
  Administered 2024-06-19 – 2024-06-25 (×5): 5 mg via ORAL
  Filled 2024-06-19 (×7): qty 1

## 2024-06-19 MED ORDER — PANTOPRAZOLE SODIUM 40 MG PO TBEC
40.0000 mg | DELAYED_RELEASE_TABLET | Freq: Two times a day (BID) | ORAL | Status: DC
  Administered 2024-06-20 – 2024-06-26 (×12): 40 mg via ORAL
  Filled 2024-06-19 (×12): qty 1

## 2024-06-19 MED ORDER — LACTATED RINGERS IV SOLN: INTRAVENOUS | Status: AC

## 2024-06-19 MED ORDER — PRAMIPEXOLE DIHYDROCHLORIDE 0.25 MG PO TABS
0.5000 mg | ORAL_TABLET | Freq: Three times a day (TID) | ORAL | Status: DC
  Administered 2024-06-19 – 2024-06-26 (×17): 0.5 mg via ORAL
  Filled 2024-06-19 (×22): qty 2

## 2024-06-19 MED ORDER — OXYCODONE HCL 5 MG PO TABS
5.0000 mg | ORAL_TABLET | Freq: Four times a day (QID) | ORAL | Status: DC | PRN
  Administered 2024-06-21 (×2): 5 mg via ORAL
  Filled 2024-06-19 (×2): qty 1

## 2024-06-19 MED ORDER — ELACESTRANT HYDROCHLORIDE 345 MG PO TABS: 345.0000 mg | ORAL_TABLET | Freq: Every day | ORAL | Status: DC

## 2024-06-19 MED ORDER — SUCRALFATE 1 GM/10ML PO SUSP
1.0000 g | Freq: Three times a day (TID) | ORAL | Status: DC
  Administered 2024-06-19 – 2024-06-26 (×22): 1 g via ORAL
  Filled 2024-06-19 (×23): qty 10

## 2024-06-19 MED ORDER — MORPHINE SULFATE (PF) 4 MG/ML IV SOLN: 4.0000 mg | INTRAVENOUS | Status: DC | PRN

## 2024-06-19 MED ORDER — FAMOTIDINE 20 MG PO TABS
40.0000 mg | ORAL_TABLET | Freq: Every day | ORAL | Status: DC
  Administered 2024-06-20 – 2024-06-24 (×5): 40 mg via ORAL
  Filled 2024-06-19 (×5): qty 2

## 2024-06-19 MED ORDER — ONDANSETRON HCL 4 MG/2ML IJ SOLN: 4.0000 mg | Freq: Once | INTRAMUSCULAR | Status: DC

## 2024-06-19 MED ORDER — POLYETHYLENE GLYCOL 3350 17 G PO PACK
17.0000 g | PACK | Freq: Every day | ORAL | Status: DC | PRN
  Administered 2024-06-25: 09:00:00 17 g via ORAL
  Filled 2024-06-19: qty 1

## 2024-06-19 NOTE — Progress Notes (Unsigned)
 CRITICAL VALUE STICKER  CRITICAL VALUE: AST [id number]  RECEIVER (on-site recipient of call): Rosina   DATE & TIME NOTIFIED: 06/19/24 @ 1032  MESSENGER (representative from lab): Heather   MD NOTIFIED: Dr. Loretha  TIME OF NOTIFICATION: 1039  RESPONSE: patient had visit with Dr. Loretha today

## 2024-06-19 NOTE — Progress Notes (Signed)
 [facility] Cancer Follow up:    [name], FNP [address] [place] [address]   DIAGNOSIS:  Cancer Staging  Malignant neoplasm of upper-outer quadrant of right breast in female, estrogen receptor positive (HCC) Staging form: Breast, AJCC 8th Edition - Pathologic: Stage IB (pT3, pN109mi, cM0, G2, ER+, PR+, HER2-) - Signed by Crawford Morna Pickle, NP on 10/02/2018 Multigene prognostic tests performed: MammaPrint Histologic grading system: 3 grade system - Clinical stage from 12/28/2022: Stage IV (rcT2, rcN0, rcM1, G3, ER+, PR+, HER2-) - Signed by Loretha Ash, MD on 12/28/2022 Stage prefix: Recurrence Histologic grading system: 3 grade system Laterality: Right Staged by: Pathologist and managing physician Stage used in treatment planning: Yes National guidelines used in treatment planning: Yes Type of national guideline used in treatment planning: NCCN    SUMMARY OF ONCOLOGIC HISTORY: Oncology History  Malignant neoplasm of upper-outer quadrant of right breast in female, estrogen receptor positive (HCC)  08/08/2018 Initial Diagnosis   status post right breast biopsy 08/02/2018 for a clinical T3 N0, stage IIA invasive lobular carcinoma, grade 2, estrogen receptor strongly positive, progesterone receptor 1% positive, with no HER-2 amplification and an MIB-1 of 1%.             (a) CT scan of the head and chest, without contrast 08/23/2018 showed nonspecific 0.4 cm left lower lobe pulmonary nodule, no definitive metastatic disease             (b) bone scan 08/23/2018 shows multiple spinal areas of abnormal uptake, but             (c) total spinal MRI 09/03/2018 finds bone scan findings to be secondary to degenerative disease, no evidence of metastatic disease.   08/2018 - 10/2022 Anti-estrogen oral therapy   Anastrozole ; discontinued 10/14/2018 in preparation for chemotherapy, resumed October 2020   09/16/2018 Surgery   Right mastectomy Jeoffrey)  [phone]): Invasive Lobular Carcinoma, 10.5 cm, grade 2, negative margins. 1 of 5 lymph nodes positive for carcinoma.   09/16/2018 Miscellaneous   MammaPrint high risk suggests a 5-year metastasis free survival of 93% with chemotherapy, with an absolute chemotherapy benefit in the greater than 12% range    09/16/2018 Miscellaneous   Caris testing on mastectomy sample (09/16/2018) showed stable MSI and proficient mismatch repair status, with a low mutational burden; BRCA 1 and 2 were not mutated, PI K3 was not mutated, ER B B2 was not mutated, and AKT 1 was not mutated. 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 Administration: 0.25 mg (11/05/2018), 0.25 mg (11/26/2018), 0.25 mg (12/17/2018), 0.25 mg (01/07/2019), 0.25 mg (01/28/2019), 0.25 mg (02/18/2019), 0.25 mg (03/11/2019), 0.25 mg (04/02/2019)  methotrexate  (PF) chemo injection 84 mg, 39.8 mg/m2 = 84.5 mg, Intravenous,  Once, 5 of 5 cycles. Administration: 84 mg (11/05/2018), 84 mg (11/26/2018), 84 mg (12/17/2018), 84 mg (03/11/2019), 84 mg (04/02/2019)  pegfilgrastim -cbqv (UDENYCA ) injection 6 mg, 6 mg, Subcutaneous, Once, 6 of 6 cycles. Administration: 6 mg (12/19/2018)  cyclophosphamide  (CYTOXAN ) 1,260 mg in sodium chloride  0.9 % 250 mL chemo infusion, 600 mg/m2 = 1,260 mg, Intravenous,  Once, 8 of 8 cycle. Administration: 1,260 mg (11/05/2018), 1,260 mg (11/26/2018), 1,260 mg (12/17/2018), 1,260 mg (01/07/2019), 1,260 mg (01/28/2019), 1,260 mg (02/18/2019), 1,260 mg (03/11/2019), 1,260 mg (04/02/2019)  fluorouracil  (ADRUCIL ) chemo injection  1,250 mg, 600 mg/m2 = 1,250 mg, Intravenous,  Once, 8 of 8 cycles. Administration: 1,250 mg (11/05/2018), 1,250 mg (11/26/2018), 1,250 mg (12/17/2018), 1,250 mg (01/07/2019), 1,250 mg (01/28/2019), 1,250 mg (02/18/2019), 1,250 mg (03/11/2019), 1,250 mg  (04/02/2019).    12/31/2018 - 02/19/2019 Radiation Therapy   The patient initially received a dose of 50.4 Gy in 28 fractions to the chest wall and supraclavicular region. This was delivered using a 3-D conformal, 4 field technique. The patient then received a boost to the mastectomy scar. This delivered an additional 10 Gy in 5 fractions using an en face electron field. The total dose was 60.4 Gy.   09/12/2022 Imaging   Bone scan on 09/12/2022 shows uptake in ribs, manubrium. Widespread osseous metastasis confirmed on PET scan that was completed on October 06, 2022. Right iliac crest biopsy demonstrated metastatic carcinoma consistent with patient's known breast carcinoma. ER 90% positive, PR 90% positive, Ki67 10%, HER2 negative.    10/10/2022 Treatment Plan Change   Faslodex  beginning 10/10/2022; Ibrance  beginning 12/20/2022; Zometa  every 12 weeks 11/07/2022    10/23/2022 - 11/03/2022 Radiation Therapy   Palliative Radiation 10/23/2022-11/03/2022:  left chest wall, lower T spine, and right proximal hip/pelvis were each treated to 30 GY in 10 fractions.      CURRENT THERAPY: Elacestrant , Zometa  (every 12 weeks)  INTERVAL HISTORY:  Discussed the use of AI scribe software for clinical note transcription with the patient, who gave verbal consent to proceed.  History of Present Illness  Doris Smith is a 73 year old female with metastatic estrogen receptor positive right breast cancer on third-line therapy who presents with worsening weakness, persistent nausea, and poor oral intake.  Over the past three weeks, she has experienced progressive weakness, persistent nausea, dry heaving, and marked anorexia. She reports minimal oral intake despite attempts to eat and has noted some weight changes, with minor weight gain attributed to swelling. She spends most of her time sleeping and describes significant functional decline.  She began Orserdu  (elacestrant ) two months ago as third-line therapy  following discontinuation of Verzenio  and Faslodex . She expresses concern that her current symptoms may be related to Orserdu , but also acknowledges the possibility of disease progression. Tumor markers and recent imaging are pending,  She has not yet received the GI cocktail prescribed by gastroenterology due to pharmacy delays, but continues pantoprazole  and other previously prescribed gastrointestinal medications. She is followed by palliative care and has multiple supportive care appointments scheduled. She expresses emotional distress regarding her prognosis and is seeking interventions to improve her quality of life.  Rest of the pertinent 10 point ROS reviewed and neg.    Patient Active Problem List   Diagnosis Date Noted   Malnutrition of moderate degree 08/17/2023   GAVE (gastric antral vascular ectasia) 08/17/2023   Gastric erosion 08/17/2023   Melena 08/16/2023   Anemia associated with acute blood loss 08/16/2023   Peripheral edema 12/29/2022   Restless leg syndrome 10/03/2021   Anxiety associated with depression 06/10/2020   Port-A-Cath in place 11/12/2018   Malignant neoplasm of upper-outer quadrant of right breast in female, estrogen receptor positive (HCC) 08/08/2018   Right knee pain 03/01/2015   Aortic atherosclerosis 12/28/2014   Obesity (BMI 30-39.9) 04/09/2014   Insomnia due to stress 04/09/2014   Atrial fibrillation (HCC) 10/01/2013   GERD (gastroesophageal reflux disease) 10/01/2013   Bipolar disorder (HCC) 09/27/2007   Essential hypertension 09/27/2007   ALLERGIC RHINITIS 09/27/2007   IRRITABLE BOWEL SYNDROME 09/27/2007   Arthropathy 09/27/2007  DEGENERATIVE DISC DISEASE, LUMBOSACRAL SPINE 09/27/2007    is allergic to iohexol, codeine, palonosetron , prednisone , sulfonamide derivatives, celecoxib, sulfa antibiotics, and vitamin b12.  MEDICAL HISTORY: Past Medical History:  Diagnosis Date   A-fib (HCC) 11/2012   PAF   Anxiety    takes Ativan    Asthma  04/07/2011   dx   Bipolar disorder (HCC)    Cancer (HCC)    Right breast   Depression    Early cataracts, bilateral    Fatty liver    Fibromyalgia    GERD (gastroesophageal reflux disease)    Irritable bowel syndrome    Mental disorder    dx bipolar   PONV (postoperative nausea and vomiting)    Sleep apnea 04/2011   does not wear CPAP   Tardive dyskinesia     SURGICAL HISTORY: Past Surgical History:  Procedure Laterality Date   ABDOMINAL HYSTERECTOMY     BIOPSY  08/17/2023   Procedure: BIOPSY;  Surgeon: Avram Lupita BRAVO, MD;  Location: [facility];  Service: Gastroenterology;;   COLONOSCOPY     DILATION AND CURETTAGE OF UTERUS  1981   abnormal pap   ESOPHAGOGASTRODUODENOSCOPY (EGD) WITH PROPOFOL  N/A 08/17/2023   Procedure: ESOPHAGOGASTRODUODENOSCOPY (EGD) WITH PROPOFOL ;  Surgeon: Avram Lupita BRAVO, MD;  Location: [facility];  Service: Gastroenterology;  Laterality: N/A;   HOT HEMOSTASIS N/A 08/17/2023   Procedure: HOT HEMOSTASIS (ARGON PLASMA COAGULATION/BICAP);  Surgeon: Avram Lupita BRAVO, MD;  Location: [facility];  Service: Gastroenterology;  Laterality: N/A;   IR IMAGING GUIDED PORT INSERTION  11/20/2023   IR THORACENTESIS ASP PLEURAL SPACE W/IMG GUIDE  09/13/2023   IR THORACENTESIS ASP PLEURAL SPACE W/IMG GUIDE  11/20/2023   IR THORACENTESIS ASP PLEURAL SPACE W/IMG GUIDE  03/20/2024   IR THORACENTESIS ASP PLEURAL SPACE W/IMG GUIDE  05/06/2024   IR THORACENTESIS ASP PLEURAL SPACE W/IMG GUIDE  05/28/2024   KNEE ARTHROSCOPY Left 2007   x 2   LUMBAR LAMINECTOMY/DECOMPRESSION MICRODISCECTOMY  05/31/2011   Procedure: LUMBAR LAMINECTOMY/DECOMPRESSION MICRODISCECTOMY;  Surgeon: Reyes JAYSON Billing;  Location: [facility];  Service: Orthopedics;  Laterality: Left;  Decompression Lumbar four to five and  Lumbar five to Sacral one on Left  (X-Ray)   MASTECTOMY W/ SENTINEL NODE BIOPSY Right 09/16/2018   Procedure: RIGHT MASTECTOMY WITH RIGHT AXILLARY SENTINEL LYMPH NODE BIOPSY;  Surgeon: Ethyl Lenis,  MD;  Location: [facility];  Service: General;  Laterality: Right;   PARTIAL HYSTERECTOMY  1982   PORTACATH PLACEMENT Left 10/31/2018   Procedure: INSERTION PORT-A-CATH WITH ULTRASOUND;  Surgeon: Ethyl Lenis, MD;  Location: [facility];  Service: General;  Laterality: Left;   TONSILLECTOMY     as child   TOTAL KNEE ARTHROPLASTY Left 12/18/2005    SOCIAL HISTORY: Social History   Socioeconomic History   Marital status: Married    Spouse name: Lynwood   Number of children: 2   Years of education: 12   Highest education level: Not on file  Occupational History   Occupation: Disabled  Tobacco Use   Smoking status: Never   Smokeless tobacco: Never  Vaping Use   Vaping status: Never Used  Substance and Sexual Activity   Alcohol use: No   Drug use: No   Sexual activity: Yes    Birth control/protection: Post-menopausal, Surgical  Other Topics Concern   Not on file  Social History Narrative   Tea daily.  Rarely has caffeine    Social Drivers of Health   Tobacco Use: Low Risk (05/30/2024)   Patient  History    Smoking Tobacco Use: Never    Smokeless Tobacco Use: Never    Passive Exposure: Not on file  Financial Resource Strain: Patient Declined (10/22/2023)   Overall Financial Resource Strain (CARDIA)    Difficulty of Paying Living Expenses: Patient declined  Food Insecurity: Patient Declined (10/22/2023)   Hunger Vital Sign    Worried About Running Out of Food in the Last Year: Patient declined    Ran Out of Food in the Last Year: Patient declined  Transportation Needs: No Transportation Needs (10/22/2023)   PRAPARE - Administrator, Civil Service (Medical): No    Lack of Transportation (Non-Medical): No  Physical Activity: Unknown (10/22/2023)   Exercise Vital Sign    Days of Exercise per Week: 0 days    Minutes of Exercise per Session: Not on file  Stress: Stress Concern Present (10/22/2023)   [employer] of Occupational Health - Occupational  Stress Questionnaire    Feeling of Stress : To some extent  Social Connections: Unknown (10/22/2023)   Social Connection and Isolation Panel    Frequency of Communication with Friends and Family: More than three times a week    Frequency of Social Gatherings with Friends and Family: Twice a week    Attends Religious Services: Patient declined    Database Administrator or Organizations: No    Attends Banker Meetings: Never    Marital Status: Married  Catering Manager Violence: Not At Risk (08/16/2023)   Humiliation, Afraid, Rape, and Kick questionnaire    Fear of Current or Ex-Partner: No    Emotionally Abused: No    Physically Abused: No    Sexually Abused: No  Depression (PHQ2-9): High Risk (05/16/2024)   Depression (PHQ2-9)    PHQ-2 Score: 13  Alcohol Screen: Low Risk (08/18/2022)   Alcohol Screen    Last Alcohol Screening Score (AUDIT): 0  Housing: Low Risk (10/22/2023)   Housing Stability Vital Sign    Unable to Pay for Housing in the Last Year: No    Number of Times Moved in the Last Year: 0    Homeless in the Last Year: No  Utilities: Not At Risk (08/16/2023)   AHC Utilities    Threatened with loss of utilities: No  Health Literacy: Not on file    FAMILY HISTORY: Family History  Problem Relation Age of Onset   Anesthesia problems Mother    Heart disease Mother        CHF, atrial fib   Heart failure Mother    Anesthesia problems Sister    Colon polyps Father    Heart disease Father        Fluid around the heart   Hypertension Sister    Breast cancer Maternal Aunt    Colon cancer Maternal Aunt    Prostate cancer Neg Hx    Ovarian cancer Neg Hx       PHYSICAL EXAMINATION    Vitals:   06/19/24 0951  BP: (!) 126/57  Pulse: 82  Resp: 18  SpO2: 99%    She appears very ill today. She was having dry heaves,  Appears cachectic BLE swelling present.  LABORATORY DATA:  CBC    Component Value Date/Time   WBC 4.3 06/19/2024 0903   WBC 3.8 (L)  05/17/2024 1425   RBC 2.66 (L) 06/19/2024 0903   HGB 8.4 (L) 06/19/2024 0903   HGB 12.0 06/05/2022 1024   HCT 25.0 (L) 06/19/2024 0903   HCT 37.4  06/05/2022 1024   PLT 92 (L) 06/19/2024 0903   PLT 251 06/05/2022 1024   MCV 94.0 06/19/2024 0903   MCV 86 06/05/2022 1024   MCH 31.6 06/19/2024 0903   MCHC 33.6 06/19/2024 0903   RDW 17.3 (H) 06/19/2024 0903   RDW 15.5 (H) 06/05/2022 1024   LYMPHSABS 0.4 (L) 06/19/2024 0903   LYMPHSABS 0.9 06/05/2022 1024   MONOABS 0.5 06/19/2024 0903   EOSABS 0.0 06/19/2024 0903   EOSABS 0.1 06/05/2022 1024   BASOSABS 0.0 06/19/2024 0903   BASOSABS 0.1 06/05/2022 1024    CMP     Component Value Date/Time   NA 139 05/22/2024 0939   NA 145 (H) 06/05/2022 1024   K 3.8 05/22/2024 0939   CL 105 05/22/2024 0939   CO2 28 05/22/2024 0939   GLUCOSE 124 (H) 05/22/2024 0939   BUN 12 05/22/2024 0939   BUN 17 06/05/2022 1024   CREATININE 0.89 05/22/2024 0939   CALCIUM 7.7 (L) 05/22/2024 0939   PROT 5.4 (L) 05/22/2024 0939   PROT 6.6 06/05/2022 1024   ALBUMIN 2.9 (L) 05/22/2024 0939   ALBUMIN 4.0 06/05/2022 1024   AST 116 (H) 05/22/2024 0939   ALT 28 05/22/2024 0939   ALKPHOS 206 (H) 05/22/2024 0939   BILITOT 0.5 05/22/2024 0939   GFRNONAA >60 05/22/2024 0939   GFRAA 81 02/03/2020 1049   GFRAA >60 12/19/2018 1434     ASSESSMENT and THERAPY PLAN:   No problem-specific Assessment & Plan notes found for this encounter. Assessment and Plan Assessment & Plan Metastatic breast cancer with liver involvement, bone involvement and malignant pleural effusion Started elacestrant  on Oct 9 th. Significant clinical decline. Current frailty and ECOG status preclude further aggressive cancer-directed therapy.  - Admitted to hospital for evaluation and management. - consider inpatient imaging to assess for disease progression. - Will reassess eligibility for additional cancer-directed therapies after clinical stabilization.  Cancer-related anorexia, weight  loss, and vomiting Three weeks of persistent dry heaving, nausea, and minimal oral intake, resulting in weight loss and functional decline. Symptoms likely multifactorial due to disease progression and possible medication effects. Hemoglobin stable at 8.4. Concern for possible electrolyte disturbances. - Admitted for supportive care. - Administer IV fluids and IV antiemetics.  Palliative care management Actively managed by palliative care for symptom control, focusing on comfort and quality of life. If disease progression is confirmed and functional status remains poor, will prioritize comfort measures. - Coordinate with palliative care team for symptom management and goals of care discussions. - Will reassess goals of care and treatment options following inpatient evaluation.  We discussed goals of care once again very clearly. If her PS deteriorates, then we will consider transitioning to palliative care.  All questions were answered. The patient knows to call the clinic with any problems, questions or concerns. We can certainly see the patient much sooner if necessary.  Total encounter time:40 minutes*in face-to-face visit time, chart review, lab review, care coordination, order entry, and documentation of the encounter time.   *Total Encounter Time as defined by the Centers for Medicare and Medicaid Services includes, in addition to the face-to-face time of a patient visit (documented in the note above) non-face-to-face time: obtaining and reviewing outside history, ordering and reviewing medications, tests or procedures, care coordination (communications with other health care professionals or caregivers) and documentation in the medical record.

## 2024-06-19 NOTE — ED Provider Notes (Addendum)
 [place] EMERGENCY DEPARTMENT AT [facility] Provider Note   CSN: [id number] Arrival date & time: 06/19/24  1028     Patient presents with: Back Pain, Failure To Thrive, and Nausea   Doris Smith is a 73 y.o. female.   HPI Patient has breast cancer metastatic to multiple sites.  She has been having increasing difficulty with pain control, nausea and vomiting.  Patient was seen at her oncologist office today by Dr.Praveena Irku.  Patient's deteriorating condition recommendation was made for admission to the hospital for control of nausea vomiting and pain.  Patient and family members deny fever.  There has been no diarrhea.  She has diffuse abdominal pain but this has been more consistent with chronic pain associated with metastatic cancer.  She also has a lot of back pain.  Patient has been prescribed oral oxycodone  and OxyContin  but has had continued vomiting and difficulty with pain control.    Prior to Admission medications  Medication Sig Start Date End Date Taking? Authorizing Provider  AMBULATORY NON FORMULARY MEDICATION Medication Name: 90 ml Dicyclomine  10mg /35ml:90 ml Lidocaine  2 %:270 ml Maalox - Take 5-10 ML every 4-6 hour as needed. 06/12/24   Beather Delon Gibson, PA  cyanocobalamin  1000 MCG tablet Take 1 tablet (1,000 mcg total) by mouth daily. 08/19/23   Jillian Buttery, MD  dicyclomine -lidocaine -alum & mag hydroxide-simeth Take 5-10 mLs by mouth every 4 (four) hours as needed. 06/12/24   Beather Delon Gibson, PA  elacestrant  hydrochloride (ORSERDU ) 345 MG tablet Take 1 tablet (345 mg total) by mouth daily. Take with food. 05/30/24   Iruku, Praveena, MD  famotidine  (PEPCID ) 40 MG tablet Take 1 tablet (40 mg total) by mouth at bedtime. 05/30/24 11/26/24  Zehr, Jessica D, PA-C  Incontinence Supply Disposable (CERTAINTY FITTED BRIEFS XL) MISC 1 Package by Does not apply route as needed. 11/30/22   Pickenpack-Cousar, Athena N, NP  lidocaine  (LIDODERM ) 5 % Place 2  patches onto the skin daily. Remove & Discard patch within 12 hours or as directed by MD 10/16/23   Pickenpack-Cousar, Athena N, NP  lidocaine -prilocaine  (EMLA ) cream Apply 1 Application topically as needed. Apply to port site 30-45 min prior to appointment. 01/29/24   Pickenpack-Cousar, Athena N, NP  LORazepam  (ATIVAN ) 1 MG tablet Take 1 tablet (1 mg total) by mouth 2 (two) times daily as needed for anxiety. 01/04/24   [name], FNP  magic mouthwash (nystatin , diphenhydrAMINE , alum & mag hydroxide) suspension mixture Swish and swallow 5 mLs three times daily 05/01/24   Pickenpack-Cousar, Athena N, NP  OLANZapine  (ZYPREXA ) 5 MG tablet Take 1 tablet (5 mg total) by mouth at bedtime. 04/29/24   Iruku, Praveena, MD  ondansetron  (ZOFRAN ) 8 MG tablet TAKE 1 TABLET BY MOUTH EVERY 8 HOURS AS NEEDED FOR NAUSEA FOR VOMITING 05/05/24   Gladis, [name], FNP  oxyCODONE  (OXYCONTIN ) 15 mg 12 hr tablet Take 1 tablet (15 mg total) by mouth every 12 (twelve) hours. 05/27/24   Pickenpack-Cousar, Athena N, NP  oxyCODONE -acetaminophen  (PERCOCET) 10-325 MG tablet Take 1 tablet by mouth every 4 (four) hours as needed for pain. 04/29/24   Iruku, Praveena, MD  pantoprazole  (PROTONIX ) 40 MG tablet Take 1 tablet (40 mg total) by mouth 2 (two) times daily before a meal. 01/04/24   Gladis, [name], FNP  polyethylene glycol (MIRALAX / GLYCOLAX) 17 g packet Take 17 g by mouth daily.    [provider]  pramipexole  (MIRAPEX ) 1 MG tablet Take 1 tablet (1 mg total) by mouth  3 (three) times daily. 05/16/24   Gladis [name], FNP  prochlorperazine  (COMPAZINE ) 10 MG tablet Take 1 tablet (10 mg total) by mouth every 6 (six) hours as needed for nausea or vomiting. 08/02/23   Pickenpack-Cousar, Fannie SAILOR, NP  sennosides-docusate sodium  (SENOKOT-S) 8.6-50 MG tablet Take 1 tablet by mouth daily.    [provider]  sucralfate  (CARAFATE ) 1 GM/10ML suspension Take 10 mLs (1 g total) by mouth 4 (four)  times daily -  with meals and at bedtime. 05/30/24   Zehr, Jessica D, PA-C    Allergies: Iohexol, Codeine, Palonosetron , Prednisone , Sulfonamide derivatives, Celecoxib, Sulfa antibiotics, and Vitamin b12    Review of Systems  Updated Vital Signs BP 113/64 (BP Location: Left Arm)   Pulse 91   Temp 97.6 F (36.4 C) (Oral)   Resp 20   Ht 5' 4 (1.626 m)   Wt 64.3 kg   SpO2 100%   BMI 24.32 kg/m   Physical Exam Constitutional:      Comments: Patient is very weak and debilitated in appearance.  No respiratory distress.  HENT:     Mouth/Throat:     Mouth: Mucous membranes are dry.     Pharynx: Oropharynx is clear.  Cardiovascular:     Rate and Rhythm: Normal rate and regular rhythm.  Pulmonary:     Comments: No respiratory distress at rest.  Breath sounds are soft. Abdominal:     Comments: Abdomen soft.  Not significantly distended.  No guarding.  Musculoskeletal:     Comments: 2+ edema bilateral lower extremities.  Skin:    General: Skin is warm and dry.     Coloration: Skin is pale.  Neurological:     Comments: Patient is mildly confused.  Family members note that she has had confusion.  Fatigued in appearance.     (all labs ordered are listed, but only abnormal results are displayed) Labs Reviewed  COMPREHENSIVE METABOLIC PANEL WITH GFR  CBC WITH DIFFERENTIAL/PLATELET    EKG: None  Radiology: No results found.   Procedures   Medications Ordered in the ED  morphine  (PF) 4 MG/ML injection 4 mg (has no administration in time range)  ondansetron  (ZOFRAN ) injection 4 mg (has no administration in time range)  lactated ringers  infusion (has no administration in time range)                                    Medical Decision Making Amount and/or Complexity of Data Reviewed Labs: ordered.  Risk Prescription drug management. Decision regarding hospitalization.   Patient presents as outlined.  EMR reviewed.  Patient is chronically ill with cancer  metastatic to multiple sites.  She is having worsening problems with pain control and unmanageable vomiting.  Will initiate IV fluids, morphine  for pain and Zofran  for nausea.  Consult: Message sent to Dr.Irku.  Confirms plan for admission.  Consult: Dr. Celinda tried hospitalist for admission  Final diagnoses:  Intractable pain  Nausea and vomiting, unspecified vomiting type  Metastasis from breast cancer [facility])    ED Discharge Orders     None          Armenta Canning, MD 06/19/24 1215    Armenta Canning, MD 06/19/24 1300

## 2024-06-19 NOTE — H&P (Signed)
 History and Physical    Patient: Doris Smith FMW:[id number] DOB: 1951-04-21 DOA: 06/19/2024 DOS: the patient was seen and examined on 06/19/2024 PCP: [name], FNP  Patient coming from: Home  Chief Complaint:  Chief Complaint  Patient presents with   Back Pain   Failure To Thrive   Nausea   HPI: Doris Smith is a 73 y.o. female with medical history significant of paroxysmal atrial fibrillation, aortic atherosclerosis, anxiety, depression, bipolar disorder, asthma, fatty liver disease, fibromyalgia, glaucoma, gastric erosion, GERD, irritable bowel syndrome, sleep apnea not on CPAP, restless leg syndrome, tardive dyskinesia, vitamin B12 deficiency, stage IV breast cancer who presented to the emergency department with back pain, nausea, decreased oral intake and overall failure to thrive.  She has continued to lose weight. No abdominal pain, emesis, diarrhea, melena or hematochezia, but positive for constipation.  No flank pain, dysuria, frequency or hematuria.  She denied fever, chills, rhinorrhea, sore throat, wheezing or hemoptysis.  No chest pain, palpitations, diaphoresis, PND, orthopnea or pitting edema of the lower extremities.   No polyuria, polydipsia, polyphagia or blurred vision.   Lab work: CBC showed white count 4.3, hemoglobin 8.4 g/dL and platelets 92 (last month WBC 3.8, hemoglobin 8.4 g/deciliter and platelets 127).  CMP showed normal electrolytes after calcium correction, normal glucose and BUN creatinine has increased from 0.89 to 1.04 mg/dL.  Total bilirubin 0.9 mg deciliter, total protein 5.5 and albumin 2.8 g/dL, AST 758, ALT 37 and alkaline phosphatase 398 units/L.  ED course: Initial vital signs were temperature 97.6 F, pulse 91, respiration 20, BP 113/64 mmHg and O2 sat 100% on nasal cannula at 2 LPM.  Review of Systems: As mentioned in the history of present illness. All other systems reviewed and are negative. Past Medical History:  Diagnosis  Date   A-fib (HCC) 11/2012   PAF   Anxiety    takes Ativan    Asthma 04/07/2011   dx   Bipolar disorder (HCC)    Cancer (HCC)    Right breast   Depression    Early cataracts, bilateral    Fatty liver    Fibromyalgia    GERD (gastroesophageal reflux disease)    Irritable bowel syndrome    Mental disorder    dx bipolar   PONV (postoperative nausea and vomiting)    Sleep apnea 04/2011   does not wear CPAP   Tardive dyskinesia    Past Surgical History:  Procedure Laterality Date   ABDOMINAL HYSTERECTOMY     BIOPSY  08/17/2023   Procedure: BIOPSY;  Surgeon: Avram Lupita BRAVO, MD;  Location: [facility];  Service: Gastroenterology;;   COLONOSCOPY     DILATION AND CURETTAGE OF UTERUS  1981   abnormal pap   ESOPHAGOGASTRODUODENOSCOPY (EGD) WITH PROPOFOL  N/A 08/17/2023   Procedure: ESOPHAGOGASTRODUODENOSCOPY (EGD) WITH PROPOFOL ;  Surgeon: Avram Lupita BRAVO, MD;  Location: [facility];  Service: Gastroenterology;  Laterality: N/A;   HOT HEMOSTASIS N/A 08/17/2023   Procedure: HOT HEMOSTASIS (ARGON PLASMA COAGULATION/BICAP);  Surgeon: Avram Lupita BRAVO, MD;  Location: [facility];  Service: Gastroenterology;  Laterality: N/A;   IR IMAGING GUIDED PORT INSERTION  11/20/2023   IR THORACENTESIS ASP PLEURAL SPACE W/IMG GUIDE  09/13/2023   IR THORACENTESIS ASP PLEURAL SPACE W/IMG GUIDE  11/20/2023   IR THORACENTESIS ASP PLEURAL SPACE W/IMG GUIDE  03/20/2024   IR THORACENTESIS ASP PLEURAL SPACE W/IMG GUIDE  05/06/2024   IR THORACENTESIS ASP PLEURAL SPACE W/IMG GUIDE  05/28/2024   KNEE ARTHROSCOPY Left 2007  x 2   LUMBAR LAMINECTOMY/DECOMPRESSION MICRODISCECTOMY  05/31/2011   Procedure: LUMBAR LAMINECTOMY/DECOMPRESSION MICRODISCECTOMY;  Surgeon: Reyes JAYSON Billing;  Location: [facility];  Service: Orthopedics;  Laterality: Left;  Decompression Lumbar four to five and  Lumbar five to Sacral one on Left  (X-Ray)   MASTECTOMY W/ SENTINEL NODE BIOPSY Right 09/16/2018   Procedure: RIGHT MASTECTOMY WITH RIGHT AXILLARY  SENTINEL LYMPH NODE BIOPSY;  Surgeon: Ethyl Lenis, MD;  Location: [facility];  Service: General;  Laterality: Right;   PARTIAL HYSTERECTOMY  1982   PORTACATH PLACEMENT Left 10/31/2018   Procedure: INSERTION PORT-A-CATH WITH ULTRASOUND;  Surgeon: Ethyl Lenis, MD;  Location: [facility];  Service: General;  Laterality: Left;   TONSILLECTOMY     as child   TOTAL KNEE ARTHROPLASTY Left 12/18/2005   Social History:  reports that she has never smoked. She has never used smokeless tobacco. She reports that she does not drink alcohol and does not use drugs.  Allergies[1]  Family History  Problem Relation Age of Onset   Anesthesia problems Mother    Heart disease Mother        CHF, atrial fib   Heart failure Mother    Anesthesia problems Sister    Colon polyps Father    Heart disease Father        Fluid around the heart   Hypertension Sister    Breast cancer Maternal Aunt    Colon cancer Maternal Aunt    Prostate cancer Neg Hx    Ovarian cancer Neg Hx     Prior to Admission medications  Medication Sig Start Date End Date Taking? Authorizing Provider  AMBULATORY NON FORMULARY MEDICATION Medication Name: 90 ml Dicyclomine  10mg /39ml:90 ml Lidocaine  2 %:270 ml Maalox - Take 5-10 ML every 4-6 hour as needed. 06/12/24   Beather Delon Gibson, PA  cyanocobalamin  1000 MCG tablet Take 1 tablet (1,000 mcg total) by mouth daily. 08/19/23   Jillian Buttery, MD  dicyclomine -lidocaine -alum & mag hydroxide-simeth Take 5-10 mLs by mouth every 4 (four) hours as needed. 06/12/24   Beather Delon Gibson, PA  elacestrant  hydrochloride (ORSERDU ) 345 MG tablet Take 1 tablet (345 mg total) by mouth daily. Take with food. 05/30/24   Iruku, Praveena, MD  famotidine  (PEPCID ) 40 MG tablet Take 1 tablet (40 mg total) by mouth at bedtime. 05/30/24 11/26/24  Zehr, Jessica D, PA-C  Incontinence Supply Disposable (CERTAINTY FITTED BRIEFS XL) MISC 1 Package by Does not apply route as needed. 11/30/22    Pickenpack-Cousar, Athena N, NP  lidocaine  (LIDODERM ) 5 % Place 2 patches onto the skin daily. Remove & Discard patch within 12 hours or as directed by MD 10/16/23   Pickenpack-Cousar, Athena N, NP  lidocaine -prilocaine  (EMLA ) cream Apply 1 Application topically as needed. Apply to port site 30-45 min prior to appointment. 01/29/24   Pickenpack-Cousar, Athena N, NP  LORazepam  (ATIVAN ) 1 MG tablet Take 1 tablet (1 mg total) by mouth 2 (two) times daily as needed for anxiety. 01/04/24   [name], FNP  magic mouthwash (nystatin , diphenhydrAMINE , alum & mag hydroxide) suspension mixture Swish and swallow 5 mLs three times daily 05/01/24   Pickenpack-Cousar, Athena N, NP  OLANZapine  (ZYPREXA ) 5 MG tablet Take 1 tablet (5 mg total) by mouth at bedtime. 04/29/24   Iruku, Praveena, MD  ondansetron  (ZOFRAN ) 8 MG tablet TAKE 1 TABLET BY MOUTH EVERY 8 HOURS AS NEEDED FOR NAUSEA FOR VOMITING 05/05/24   Gladis, [name], FNP  oxyCODONE  (OXYCONTIN ) 15 mg 12 hr tablet Take  1 tablet (15 mg total) by mouth every 12 (twelve) hours. 05/27/24   Pickenpack-Cousar, Athena N, NP  oxyCODONE -acetaminophen  (PERCOCET) 10-325 MG tablet Take 1 tablet by mouth every 4 (four) hours as needed for pain. 04/29/24   Iruku, Praveena, MD  pantoprazole  (PROTONIX ) 40 MG tablet Take 1 tablet (40 mg total) by mouth 2 (two) times daily before a meal. 01/04/24   Gladis, [name], FNP  polyethylene glycol (MIRALAX / GLYCOLAX) 17 g packet Take 17 g by mouth daily.    [provider]  pramipexole  (MIRAPEX ) 1 MG tablet Take 1 tablet (1 mg total) by mouth 3 (three) times daily. 05/16/24   Gladis [name], FNP  prochlorperazine  (COMPAZINE ) 10 MG tablet Take 1 tablet (10 mg total) by mouth every 6 (six) hours as needed for nausea or vomiting. 08/02/23   Pickenpack-Cousar, Fannie SAILOR, NP  sennosides-docusate sodium  (SENOKOT-S) 8.6-50 MG tablet Take 1 tablet by mouth daily.    [provider]  sucralfate  (CARAFATE )  1 GM/10ML suspension Take 10 mLs (1 g total) by mouth 4 (four) times daily -  with meals and at bedtime. 05/30/24   Cayetano Harlene BIRCH, PA-C    Physical Exam: Vitals:   06/19/24 1200 06/19/24 1215 06/19/24 1230 06/19/24 1245  BP: (!) 104/56 (!) 105/56 (!) 89/49 109/65  Pulse: 67 71 69 77  Resp:      Temp:      TempSrc:      SpO2: 100% 100% 100% 100%  Weight:      Height:       Physical Exam Vitals and nursing note reviewed.  Constitutional:      General: She is awake. She is not in acute distress.    Appearance: Normal appearance. She is ill-appearing. She is not toxic-appearing.     Comments: Chronically ill-appearing.  HENT:     Head: Normocephalic.     Nose: No rhinorrhea.     Mouth/Throat:     Mouth: Mucous membranes are dry.  Eyes:     General: No scleral icterus.    Pupils: Pupils are equal, round, and reactive to light.  Neck:     Vascular: No JVD.  Cardiovascular:     Rate and Rhythm: Normal rate and regular rhythm.     Heart sounds: S1 normal and S2 normal.  Pulmonary:     Effort: Pulmonary effort is normal.     Breath sounds: Normal breath sounds. No wheezing, rhonchi or rales.  Abdominal:     General: Bowel sounds are normal.     Palpations: Abdomen is soft.     Tenderness: There is no abdominal tenderness. There is no right CVA tenderness or left CVA tenderness.  Musculoskeletal:     Cervical back: Neck supple.     Lumbar back: Tenderness present.     Right lower leg: 2+ Pitting Edema present.     Left lower leg: 2+ Pitting Edema present.  Skin:    General: Skin is warm and dry.     Findings: Bruising present.     Comments: Some areas of ecchymosis on the extremities.  Neurological:     General: No focal deficit present.     Mental Status: She is alert. She is disoriented.  Psychiatric:        Mood and Affect: Mood normal.        Behavior: Behavior normal. Behavior is cooperative.     Data Reviewed:  Results are pending, will review when  available.  Assessment and Plan: Principal  Problem:   Failure to thrive in adult Observation/MedSurg. Analgesics as needed. Antiemetics as needed. Trial of Periactin tonight. Follow CBC, CMP in AM. Consult TOC team. Consider PT/OT evaluation. Consider nutritional services consult.  Active Problems:   Bipolar disorder (HCC)   Anxiety associated with depression Continue olanzapine  5 mg p.o. bedtime. Continue lorazepam  1 mg p.o. twice daily.    Essential hypertension Monitor blood pressure.    Paroxysmal atrial fibrillation (HCC) Normal rate and rhythm.    GERD (gastroesophageal reflux disease) Continue PPI and sucralfate .    Aortic atherosclerosis Will defer statin given advanced malignancy.    Malignant neoplasm of upper-outer quadrant of  right breast in female, estrogen receptor positive (HCC) Continue Orserdu  345 mg p.o. daily. Follow-up with oncology as scheduled.    Restless leg syndrome Continue pramipexole  3 times daily.    Chronic back pain Continue OxyContin  15 mg p.o. twice daily.    Constipation Continue MiraLAX every day as needed.     Advance Care Planning:   Code Status: Limited: Do not attempt resuscitation (DNR) -DNR-LIMITED -Do Not Intubate/DNI    Consults:   Family Communication: Her sister and spouse were at bedside.  Severity of Illness: The appropriate patient status for this patient is OBSERVATION. Observation status is judged to be reasonable and necessary in order to provide the required intensity of service to ensure the patient's safety. The patient's presenting symptoms, physical exam findings, and initial radiographic and laboratory data in the context of their medical condition is felt to place them at decreased risk for further clinical deterioration. Furthermore, it is anticipated that the patient will be medically stable for discharge from the hospital within 2 midnights of admission.   Author: Alm Dorn Castor, MD 06/19/2024  12:59 PM  For on call review [email or web address].   This document was prepared using Dragon voice recognition software and may contain some unintended transcription errors.     [1]  Allergies Allergen Reactions   Iohexol     over 20 years ago had reaction hurting in arm and heart-Done in [place], [place].SABRAphysician stopped CT at that time.   Codeine Other (See Comments)    Makes feel like she is wild    Palonosetron  Other (See Comments)    headache   Prednisone  Other (See Comments)    Makes me very irritable   Sulfonamide Derivatives Other (See Comments)    Upset stomach   Celecoxib Nausea And Vomiting   Sulfa Antibiotics Nausea And Vomiting   Vitamin B12 Rash

## 2024-06-19 NOTE — Progress Notes (Signed)
 BB sample order added to labs. Sample collected, blood bank band applied on patient.

## 2024-06-19 NOTE — ED Triage Notes (Addendum)
 Pt came in because Dr said she isn't looking well and isn't eating. Dry heaving and nausea. Pt has been lethargic and declining. Pt reporting 8/10 pain. Hasn't received GI cocktail that she was supposed to get in the mail. Hx of metastatic breast ca.

## 2024-06-19 NOTE — Plan of Care (Signed)

## 2024-06-20 DIAGNOSIS — F418 Other specified anxiety disorders: Secondary | ICD-10-CM | POA: Diagnosis present

## 2024-06-20 DIAGNOSIS — R627 Adult failure to thrive: Secondary | ICD-10-CM | POA: Diagnosis not present

## 2024-06-20 DIAGNOSIS — G2581 Restless legs syndrome: Secondary | ICD-10-CM | POA: Diagnosis present

## 2024-06-20 DIAGNOSIS — F319 Bipolar disorder, unspecified: Secondary | ICD-10-CM | POA: Diagnosis present

## 2024-06-20 DIAGNOSIS — I700 Atherosclerosis of aorta: Secondary | ICD-10-CM | POA: Diagnosis present

## 2024-06-20 DIAGNOSIS — Z170 Estrogen receptor positive status [ER+]: Secondary | ICD-10-CM

## 2024-06-20 DIAGNOSIS — C50411 Malignant neoplasm of upper-outer quadrant of right female breast: Secondary | ICD-10-CM

## 2024-06-20 DIAGNOSIS — I10 Essential (primary) hypertension: Secondary | ICD-10-CM | POA: Diagnosis present

## 2024-06-20 DIAGNOSIS — K5900 Constipation, unspecified: Secondary | ICD-10-CM | POA: Diagnosis present

## 2024-06-20 DIAGNOSIS — K219 Gastro-esophageal reflux disease without esophagitis: Secondary | ICD-10-CM | POA: Diagnosis present

## 2024-06-20 DIAGNOSIS — I480 Paroxysmal atrial fibrillation: Secondary | ICD-10-CM | POA: Diagnosis present

## 2024-06-20 LAB — CBC WITH DIFFERENTIAL/PLATELET
Abs Immature Granulocytes: 0.04 K/uL (ref 0.00–0.07)
Basophils Absolute: 0 K/uL (ref 0.0–0.1)
Basophils Relative: 1 %
Eosinophils Absolute: 0.1 K/uL (ref 0.0–0.5)
Eosinophils Relative: 2 %
HCT: 21.9 % — ABNORMAL LOW (ref 36.0–46.0)
Hemoglobin: 7 g/dL — ABNORMAL LOW (ref 12.0–15.0)
Immature Granulocytes: 1 %
Lymphocytes Relative: 11 %
Lymphs Abs: 0.4 K/uL — ABNORMAL LOW (ref 0.7–4.0)
MCH: 31.7 pg (ref 26.0–34.0)
MCHC: 32 g/dL (ref 30.0–36.0)
MCV: 99.1 fL (ref 80.0–100.0)
Monocytes Absolute: 0.5 K/uL (ref 0.1–1.0)
Monocytes Relative: 15 %
Neutro Abs: 2.4 K/uL (ref 1.7–7.7)
Neutrophils Relative %: 70 %
Platelets: 76 K/uL — ABNORMAL LOW (ref 150–400)
RBC: 2.21 MIL/uL — ABNORMAL LOW (ref 3.87–5.11)
RDW: 17.9 % — ABNORMAL HIGH (ref 11.5–15.5)
WBC: 3.4 K/uL — ABNORMAL LOW (ref 4.0–10.5)
nRBC: 0.9 % — ABNORMAL HIGH (ref 0.0–0.2)

## 2024-06-20 LAB — COMPREHENSIVE METABOLIC PANEL WITH GFR
ALT: 33 U/L (ref 0–44)
AST: 217 U/L — ABNORMAL HIGH (ref 15–41)
Albumin: 2.4 g/dL — ABNORMAL LOW (ref 3.5–5.0)
Alkaline Phosphatase: 371 U/L — ABNORMAL HIGH (ref 38–126)
Anion gap: 9 (ref 5–15)
BUN: 15 mg/dL (ref 8–23)
CO2: 24 mmol/L (ref 22–32)
Calcium: 7.5 mg/dL — ABNORMAL LOW (ref 8.9–10.3)
Chloride: 104 mmol/L (ref 98–111)
Creatinine, Ser: 1.02 mg/dL — ABNORMAL HIGH (ref 0.44–1.00)
GFR, Estimated: 59 mL/min — ABNORMAL LOW (ref >60)
Glucose, Bld: 132 mg/dL — ABNORMAL HIGH (ref 70–99)
Potassium: 3.8 mmol/L (ref 3.5–5.1)
Sodium: 137 mmol/L (ref 135–145)
Total Bilirubin: 0.7 mg/dL (ref 0.0–1.2)
Total Protein: 4.6 g/dL — ABNORMAL LOW (ref 6.5–8.1)

## 2024-06-20 LAB — CANCER ANTIGEN 27.29: CA 27.29: 4452.5 U/mL — ABNORMAL HIGH (ref 0.0–38.6)

## 2024-06-20 LAB — PHOSPHORUS: Phosphorus: 3.5 mg/dL (ref 2.5–4.6)

## 2024-06-20 LAB — MAGNESIUM: Magnesium: 2.1 mg/dL (ref 1.7–2.4)

## 2024-06-20 LAB — CANCER ANTIGEN 15-3: CA 15-3: 1544 U/mL — ABNORMAL HIGH (ref 0.0–25.0)

## 2024-06-20 MED ORDER — DIPHENHYDRAMINE HCL 25 MG PO CAPS
50.0000 mg | ORAL_CAPSULE | Freq: Once | ORAL | Status: AC
  Administered 2024-06-21: 50 mg via ORAL
  Filled 2024-06-20: qty 2

## 2024-06-20 MED ORDER — SODIUM CHLORIDE 0.9% FLUSH: 10.0000 mL | INTRAVENOUS | Status: DC | PRN

## 2024-06-20 MED ORDER — PREDNISONE 50 MG PO TABS
50.0000 mg | ORAL_TABLET | Freq: Four times a day (QID) | ORAL | Status: AC
  Administered 2024-06-20 – 2024-06-21 (×3): 50 mg via ORAL
  Filled 2024-06-20 (×3): qty 1

## 2024-06-20 MED ORDER — DIPHENHYDRAMINE HCL 50 MG/ML IJ SOLN: 50.0000 mg | Freq: Once | INTRAMUSCULAR | Status: AC

## 2024-06-20 MED ORDER — CHLORHEXIDINE GLUCONATE CLOTH 2 % EX PADS
6.0000 | MEDICATED_PAD | Freq: Every day | CUTANEOUS | Status: DC
  Administered 2024-06-20 – 2024-06-25 (×6): 6 via TOPICAL

## 2024-06-20 MED ORDER — SODIUM CHLORIDE 0.9% FLUSH
10.0000 mL | Freq: Two times a day (BID) | INTRAVENOUS | Status: DC
  Administered 2024-06-20 – 2024-06-26 (×12): 10 mL

## 2024-06-20 MED ORDER — LACTATED RINGERS IV SOLN: INTRAVENOUS | Status: AC

## 2024-06-20 NOTE — Progress Notes (Signed)
 Prior-To-Admission Oral Chemotherapy for Treatment of Oncologic Disease   Order noted from Dr. CHARM Castor to continue prior-to-admission oral chemotherapy regimen of elacestrant  hydrochloride (Orserdu ).  Procedure Per Pharmacy & Therapeutics Committee Policy: Orders for continuation of home oral chemotherapy for treatment of an oncologic disease will be held unless approved by an oncologist during current admission.    For patients receiving oncology care at [facility], inpatient pharmacist contacts patient's oncologist during regular office hours to review. If earlier review is medically necessary, attending physician consults [facility] on-call oncologist   For patients receiving oncology care outside of [facility], attending physician consults patient's oncologist to review. If this oncologist or their coverage cannot be reached, attending physician consults [facility] on-call oncologist     Plan: hold elacestrant  per secure chat with Dr. LULLA Chad 06/20/2024    Doris Smith, Pharm.D Use secure chat for questions 06/20/2024 10:08 AM

## 2024-06-20 NOTE — Care Management Obs Status (Signed)
 MEDICARE OBSERVATION STATUS NOTIFICATION   Patient Details  Name: Doris Smith MRN: [id number] Date of Birth: 06-16-1951   Medicare Observation Status Notification Given:  Yes    Doneta Glenys DASEN, RN 06/20/2024, 4:17 PM

## 2024-06-20 NOTE — Progress Notes (Signed)
 PROGRESS NOTE    Doris Smith  FMW:[id number] DOB: 19-Jul-1950 DOA: 06/19/2024 PCP: [name], FNP   Brief Narrative:  Doris Smith is a 73 y.o. female with medical history significant of paroxysmal atrial fibrillation, aortic atherosclerosis, anxiety, depression, bipolar disorder, asthma, fatty liver disease, fibromyalgia, glaucoma, gastric erosion, GERD, irritable bowel syndrome, sleep apnea not on CPAP, restless leg syndrome, tardive dyskinesia, vitamin B12 deficiency, stage IV breast cancer who presented to the emergency department with back pain, nausea, decreased oral intake and overall failure to thrive.    Assessment and Plan:  Failure to thrive in adult: . Analgesics as needed. Antiemetics as needed. Trial of Periactin tonight. Follow CBC, CMP in AM. Consult TOC team. Consider PT/OT evaluation. Consider nutritional services consult.  -W/U as below   Bipolar disorder (HCC) / Anxiety associated with depression: Continue olanzapine  5 mg p.o. bedtime and Continue lorazepam  1 mg p.o. twice daily.   Essential Hypertension: Monitor blood pressure.   Paroxysmal atrial fibrillation (HCC): Normal rate and rhythm.   Aortic atherosclerosis: Will defer statin given advanced malignancy.   Malignant neoplasm of upper-outer quadrant of right breast in female, estrogen receptor positive (HCC): Continue Orserdu  345 mg p.o. daily. Oncology consulted and since patient's tumor markers have doubled there is concern for disease progression so the oncology team is ordering a CT contrasted scans (CT Chest/Abd/Pelvis) with prophylaxis and if this scan showed worsening cancer then the recommendation will be for inpatient palliative care medicine   Restless Leg Syndrome: Continue Pramipexole  0.5 mg 3 times daily.   Chronic back pain: Continue OxyContin  15 mg p.o. twice daily.   Constipation: Continue MiraLAX every day as needed.  Pancytopenia: CBC Trend:  Recent Labs  Lab  05/22/24 0939 06/19/24 0903 06/20/24 1028  WBC 3.8* 4.3 3.4*  HGB 8.4* 8.4* 7.0*  HCT 25.3* 25.0* 21.9*  MCV 97.3 94.0 99.1  PLT 127* 92* 76*  -CTM for S/Sx of Bleeding; Transfuse for Hgb < 7.0. CTM For S/Sx of Bleeding; No overt bleeding noted. Repeat CBC in the AM  Hypoalbuminemia: Patient's Albumin Lvl went from 2.9 -> 2.8 -> 2.4. CTM & Trend & repeat CMP in the AM  GERD (gastroesophageal reflux disease)/GI Prophylaxis: Continue PPI w/ Pantoprazole  40 mg po BID and Sucralfate  1 gram po TIDwm and HS.  DVT prophylaxis: SCDs    Code Status: Limited: Do not attempt resuscitation (DNR) -DNR-LIMITED -Do Not Intubate/DNI  Family Communication: D/w Husband @ bedside  Disposition Plan:  Level of care: Med-Surg Status is: Observation The patient will require care spanning > 2 midnights and should be moved to inpatient because: Needs further clinical improvement   Consultants:  Oncology  Procedures:  As delineated as above  Antimicrobials:  Anti-infectives (From admission, onward)    None       Subjective: Seen and examined at bedside was nauseous and not really hungry.  Denies lightheadedness or dizzy.  Has been more fatigued.  No other concerns or complaints at this time.  Objective: Vitals:   06/19/24 2325 06/20/24 0324 06/20/24 1152 06/20/24 2005  BP: (!) 85/56 (!) 99/48 (!) 111/52 (!) 102/55  Pulse: 65 61 72 71  Resp: 18 18  18   Temp: 97.6 F (36.4 C) (!) 97.5 F (36.4 C) 97.9 F (36.6 C) 98.3 F (36.8 C)  TempSrc:  Oral  Axillary  SpO2: 100% 100% 100%   Weight:      Height:        Intake/Output Summary (Last 24 hours) at 06/20/2024  2058 Last data filed at 06/20/2024 1930 Gross per 24 hour  Intake 1753.84 ml  Output 0 ml  Net 1753.84 ml   Filed Weights   06/19/24 1050  Weight: 64.3 kg   Examination: Physical Exam:  Constitutional: Chronically ill appearing Caucasian female who appears fatigued Respiratory: Diminished to auscultation bilaterally,  no wheezing, rales, rhonchi or crackles. Normal respiratory effort and patient is not tachypenic. No accessory muscle use. Unlabored breathing  Cardiovascular: RRR, no murmurs / rubs / gallops. S1 and S2 auscultated. 1+ LE Edema Abdomen: Soft, non-tender, Mildly-distended.  Bowel sounds positive.  GU: Deferred. Musculoskeletal: No clubbing / cyanosis of digits/nails. No joint deformity upper and lower extremities.  Skin: No rashes, lesions, ulcers on a limited skin evaluation. No induration; Warm and dry.  Neurologic: CN 2-12 grossly intact with no focal deficits. Romberg sign and cerebellar reflexes not assessed.  Psychiatric: Appears calm but fatigued  Data Reviewed: I have personally reviewed following labs and imaging studies  CBC: Recent Labs  Lab 06/19/24 0903 06/20/24 1028  WBC 4.3 3.4*  NEUTROABS 3.2 2.4  HGB 8.4* 7.0*  HCT 25.0* 21.9*  MCV 94.0 99.1  PLT 92* 76*   Basic Metabolic Panel: Recent Labs  Lab 06/19/24 0903 06/20/24 1028  NA 136 137  K 3.9 3.8  CL 101 104  CO2 24 24  GLUCOSE 95 132*  BUN 16 15  CREATININE 1.04* 1.02*  CALCIUM 7.9* 7.5*  MG  --  2.1  PHOS  --  3.5   GFR: Estimated Creatinine Clearance: 43.7 mL/min (A) (by C-G formula based on SCr of 1.02 mg/dL (H)). Liver Function Tests: Recent Labs  Lab 06/19/24 0903 06/20/24 1028  AST 241* 217*  ALT 37 33  ALKPHOS 398* 371*  BILITOT 0.9 0.7  PROT 5.5* 4.6*  ALBUMIN 2.8* 2.4*   No results for input(s): LIPASE, AMYLASE in the last 168 hours. No results for input(s): AMMONIA in the last 168 hours. Coagulation Profile: No results for input(s): INR, PROTIME in the last 168 hours. Cardiac Enzymes: No results for input(s): CKTOTAL, CKMB, CKMBINDEX, TROPONINI in the last 168 hours. BNP (last 3 results) No results for input(s): PROBNP in the last 8760 hours. HbA1C: No results for input(s): HGBA1C in the last 72 hours. CBG: No results for input(s): GLUCAP in the last  168 hours. Lipid Profile: No results for input(s): CHOL, HDL, LDLCALC, TRIG, CHOLHDL, LDLDIRECT in the last 72 hours. Thyroid  Function Tests: No results for input(s): TSH, T4TOTAL, FREET4, T3FREE, THYROIDAB in the last 72 hours. Anemia Panel: No results for input(s): VITAMINB12, FOLATE, FERRITIN, TIBC, IRON, RETICCTPCT in the last 72 hours. Sepsis Labs: No results for input(s): PROCALCITON, LATICACIDVEN in the last 168 hours.  No results found for this or any previous visit (from the past 240 hours).   Radiology Studies: No results found.  Scheduled Meds:  Chlorhexidine  Gluconate Cloth  6 each Topical Daily   [START ON 06/21/2024] diphenhydrAMINE   50 mg Oral Once   Or   [START ON 06/21/2024] diphenhydrAMINE   50 mg Intravenous Once   famotidine   40 mg Oral QHS   feeding supplement  237 mL Oral BID BM   OLANZapine   5 mg Oral QHS   oxyCODONE   15 mg Oral Q12H   pantoprazole   40 mg Oral BID AC   pramipexole   0.5 mg Oral TID   predniSONE   50 mg Oral Q6H   sodium chloride  flush  10-40 mL Intracatheter Q12H   sucralfate   1 g Oral  TID WC & HS   Continuous Infusions:  lactated ringers  83 mL/hr at 06/20/24 1812    LOS: 0 days   Alejandro Marker, DO [facility] Available via Epic secure chat 7am-7pm After these hours, please refer to coverage provider listed on [email or web address] 06/20/2024, 8:58 PM

## 2024-06-20 NOTE — Hospital Course (Addendum)
 Doris Smith is a 73 y.o. female with medical history significant of paroxysmal atrial fibrillation, aortic atherosclerosis, anxiety, depression, bipolar disorder, asthma, fatty liver disease, fibromyalgia, glaucoma, gastric erosion, GERD, irritable bowel syndrome, sleep apnea not on CPAP, restless leg syndrome, tardive dyskinesia, vitamin B12 deficiency, stage IV breast cancer who presented to the emergency department with back pain, nausea, decreased oral intake and overall failure to thrive.  Oncology repeating CT Scans and going to change Chemotherapy in the outpatient setting.   Throughout the hospitalization patient has been lethargic.  Her hemoglobin dropped so she was typed and screened and transfused 2 units PRBCs.  Overnight she had a unwitnessed fall and head CT was done and showed no acute intracranial abnormalities.  Neurochecks were performed every few hours and subsequently she had a sluggish left pupil which started reacting more throughout the day however the patient became more somnolent and drowsy throughout the day..  Subsequently the nurse relayed that it stopped reactive and was dilated to 5 mm's.  Stat MRI was ordered and is pending to be done.   Patient was transferred to the stepdown unit with concern for possible myxedema coma given her elevated TSH of 183.0 so she was initiated on IV levothyroxine .  further infectious and neurological workup is being obtained and EEG has been ordered. She also had a temperature so we will obtain blood cultures and urinalysis and urine culture and start her on empiric antibiotics.  Ammonia level was also elevated so we will give her some lactulose .  Patient continues to be somnolent and drowsy and may need neurological involvement  Palliative care consulted for further goals of care discussion and awaiting to see the patient.  Assessment and Plan:  Failure to Thrive in Adult: C/w Supportive care w/ Analgesics and Antiemetics as needed.  Obtain PT/OT evaluation and PT recommending Home Health for now  -Obtain Nutritional Services consult and see below. C/w Ensure High Plus; Add Marinol  2.5 mg po BID and will increased to 5 mg po BID - IV fluid has been stopped given her volume overload and received IV Albumin  and Furosemide  yesterday and will dose give IV Lasix  today given her chest x-ray findings.  She is status post 2 units of pRBCs given her significant anemia. -W/U as below. Will need Palliative Care GOC Discussion given the events of the last 24 hours and they have been consulted  Unwitnessed Fall: Happened  overnight 12/15-12/16. Nursing notified that patient slid out of the chair to the floor and was found to be sitting on the floor with back against bed frame. Per nursing Denied hitting head. Husband notified nursing after albumin  was given. Since not witnessed fall obtained CT Head w/o Contrast which showed No acute intracranial abnormality and Mild chronic microvascular ischemic disease, stable. Only noted to have skin tear on patient's back. VSS per nurse and no other issues noted last evening. Neurochecks q2h. Subsequently she started becoming more lethargic throughout the day and was very somnolent and drowsy so we will pursue further workup infectious and neurological workup as below  Somnolence and Drowsiness: Extremely somnolent and drowsy this AM and more lethargic but got worse throughout the day.  Checked an ammonia level and it was 70 so we will start her on lactulose  20 g twice daily.  Vitamin B12 level was 2762.  Also check TSH which was 183.000.  And RPR and HIV was negative.  Will transfer to the stepdown unit and see below.  Check for infectious etiologies.  MRI being checked as below and ordered an EEG and it was to be done but the EEG computer crashed and will need to be repaired before performing the EEG.  Given her drowsiness her p.o. intake is limited so we will change everything we can from p.o. to  IV  Discordant pupil/Anisocoria: Had a unwitnessed fall yesterday and left pupil was more sluggish after her fall but she had no visual deficits except some slight blurred vision which is normal for her.  Her started reacting more throughout the day but patient has a history of cataracts.  Subsequently the nurse notified late this evening that it was more dilated and stayed around 5 mm.  Ordering a stat head MRI.  Unfortunately told by the MRI department at [facility] long that could not be done given that they are closing for the evening so the patient will need to go to [facility] for this  Hypothyroidism with concern for Myxedema Coma: Continued to be more drowsy throughout the day and TSH was 183.000.  Check free T4 and T3 levels which are pending.  Initiate IV levothyroxine  for now  Fever: Spiked a temperature of 100.5 this evening.  Check blood cultures x 2, urinalysis with urine culture.  Initiate IV antibiotics with IV Zosyn  and IV vancomycin .  Chest x-ray was done and showed Worsening aeration to both lower lobes, possibly reflecting atelectasis and/or consolidative change but also showed bilateral Bilateral interstitial opacities, likely pulmonary edema and Moderate bilateral pleural effusions.  She was given IV Lasix  20 mg as above.  Follow infectious markers.  WBC is not elevated but she is immunocompromised with her history of metastatic cancer.  Nausea and Vomiting: Initiate Antiemetics w/ Prochlorperazine  10 mg IV q6hprn N/V  Bipolar Disorder (HCC) / Anxiety associated with depression: Continue Olanzapine  5 mg p.o. bedtime and Continue Lorazepam  1 mg p.o. twice daily.   Essential Hypertension: Monitor blood pressure per Protocol. Last BP reading was 105/58   Paroxysmal Atrial Fibrillation [facility]): Normal rate and rhythm.  Continue to monitor on telemetry carefully given that she is in the stepdown unit now   Aortic Atherosclerosis: Will defer statin given advanced malignancy and given Liver  Metastasis and Abnormal LFTs   Malignant Neoplasm of upper-outer quadrant of right breast in female, estrogen receptor positive (HCC): Initially Continued Orserdu  345 mg p.o. daily but due to concern for progression the Oncology team is stopping this and since her last Bx was Her 2 2+  the Onc team is going to proceed with IV Enhertu q21 Days after Christmas (12/30) and arranging for Simultaneous Bx.  -Oncology consulted and since patient's tumor markers have doubled (CA 15-3 is 1,544.0 and CA 27.29 is 4,452.5) there is concern for disease progression so the oncology team is ordering a CT contrasted scans (CT Chest/Abd/Pelvis) with prophylaxis. This showed Status post right mastectomy. Innumerable hepatic metastases, grossly unchanged. Diffuse sclerotic osseous metastases, unchanged. Large bilateral pleural effusions, mildly progressive. Moderate abdominopelvic ascites, progressive. - Palliative care consulted for further goals of care discussion  AKI on CKD Stage II: Baseline Cr ~0.6-0.9. BUN/Cr Trend slightly worsening (22/1.27) but she has worsening swelling so IVF stopped. Had Dose of Albumin  and IV Furosemide  given her ascites; She got blood yesterday so given another dose of Albumin  and IV Furosemide . Will give a dose IV Furosemide  today.  -Avoid Nephrotoxic Medications, Contrast Dyes, Hypotension and Dehydration to Ensure Adequate Renal Perfusion and will need to Renally Adjust Meds. CTM & Trend Renal Function carefully & repeat CMP  in the AM   Abnormal LFTs: In the setting of Above. AST and ALT Trend:  Recent Labs  Lab 06/19/24 0903 06/20/24 1028 06/20/24 1028 06/21/24 0147 06/22/24 0223 06/23/24 0241 06/24/24 0415  AST 241* 217*  --  225* 226* 246* 374*  ALT 37 33   < > 35 35 37 47*   < > = values in this interval not displayed.  -CTM & Trend & repeat CMP in the AM and if necessary will obtain a RUQ U/S and an Acute Hepatitis Panel but will hold off given that she has known Liver  Metastasis.    Restless Leg Syndrome: On pramipexole  0.5 mg 3 times daily.  Hypokalemia: K+ was 3.1. Replete. CTM and Replete as Necessary.    Chronic back pain: Continue OxyContin  15 mg p.o. twice daily and po Oxycodone  5 mg po q6hprn Breakthrough Pain however will need to be held given her somnolence and drowsiness   Constipation: Continue MiraLAX  17 grams every day as needed.  Pancytopenia: CBC Trend:  Recent Labs  Lab 06/19/24 0903 06/19/24 0903 06/20/24 1028 06/21/24 0147 06/22/24 0223 06/23/24 0241 06/23/24 1817 06/24/24 0415  WBC 4.3  --  3.4* 3.7* 6.3 4.4  --  4.1  HGB 8.4*   < > 7.0* 7.2* 7.1* 6.7*   < > 10.4*  HCT 25.0*  --  21.9* 22.5* 22.2* 20.9*   < > 30.9*  MCV 94.0  --  99.1 98.7 98.7 98.6  --  90.9  PLT 92*   < > 76* 70* 77* 64*  --  48*   < > = values in this interval not displayed.  -CTM for S/Sx of Bleeding; Hgb low on 12/15 AM so will type and screen and transfuse 2 units of pRBCs. Check FOBT. Transfuse for Hgb < 7.0. CTM For S/Sx of Bleeding; No overt bleeding noted. Repeat CBC in the AM  Hypoalbuminemia: Patient's Albumin  Lvl is now 3.1. CTM & Trend & repeat CMP in the AM  GERD (gastroesophageal reflux disease)/GI Prophylaxis: Continue PPI w/ Pantoprazole  40 mg po BID and this will need to be changed to IV given her somnolence and drowsiness; continue Sucralfate  1 gram po TIDwm and HS.  Pressure Ulcer:  Wound 06/23/24 2130 Pressure Injury Coccyx Mid Deep Tissue Pressure Injury - Purple or maroon localized area of discolored intact skin or blood-filled blister due to damage of underlying soft tissue from pressure and/or shear. (Active)   Severe Malnutrition in the Context of Chronic illness: Nutrition Status: Nutrition Problem: Severe Malnutrition Etiology: chronic illness (stage IV breast cancer) Signs/Symptoms: moderate fat depletion, severe muscle depletion, percent weight loss (12.7% in 4 months) Percent weight loss: 12.7 % (in 4  months) Interventions: Refer to RD note for recommendations, Ensure Enlive (each supplement provides 350kcal and 20 grams of protein) -Appetite has been extremely poor. Will obtain a Calorie Count

## 2024-06-20 NOTE — Plan of Care (Signed)
  Problem: Clinical Measurements: Goal: Will remain free from infection Outcome: Progressing   Problem: Activity: Goal: Risk for activity intolerance will decrease Outcome: Progressing   Problem: Coping: Goal: Level of anxiety will decrease Outcome: Progressing

## 2024-06-20 NOTE — TOC Initial Note (Signed)
 Transition of Care [facility]) - Initial/Assessment Note    Patient Details  Name: Doris Smith MRN: [id number] Date of Birth: March 09, 1951  Transition of [facility]) CM/SW Contact:    Doneta Glenys DASEN, RN Phone Number: 06/20/2024, 4:14 PM  Clinical Narrative:                 MOON completed.Home: IP CM following  Expected Discharge Plan: Home/Self Care Barriers to Discharge: Continued Medical Work up   Patient Goals and CMS Choice Patient states their goals for this hospitalization and ongoing recovery are:: Home CMS Medicare.gov Compare Post Acute Care list provided to::  (NA) Choice offered to / list presented to : NA [place] ownership interest in [facility].provided to:: Parent NA    Expected Discharge Plan and Services In-house Referral: NA Discharge Planning Services: CM Consult Post Acute Care Choice: NA Living arrangements for the past 2 months: Single Family Home                 DME Arranged: N/A DME Agency: NA       HH Arranged: NA HH Agency: NA        Prior Living Arrangements/Services Living arrangements for the past 2 months: Single Family Home Lives with:: Spouse Patient language and need for interpreter reviewed:: Yes Do you feel safe going back to the place where you live?: Yes      Need for Family Participation in Patient Care: Yes (Comment) Care giver support system in place?: Yes (comment) Current home services: DME (walker,BSC,WC) Criminal Activity/Legal Involvement Pertinent to Current Situation/Hospitalization: No - Comment as needed  Activities of Daily Living   ADL Screening (condition at time of admission) Independently performs ADLs?: Yes (appropriate for developmental age) Is the patient deaf or have difficulty hearing?: No Does the patient have difficulty seeing, even when wearing glasses/contacts?: No Does the patient have difficulty concentrating, remembering, or making decisions?: Yes  Permission  Sought/Granted Permission sought to share information with : Case Manager Permission granted to share information with : Yes, Verbal Permission Granted  Share Information with NAME: Doris Smith  Spouse, Emergency Contact  [phone]           Emotional Assessment Appearance:: Appears stated age Attitude/Demeanor/Rapport: Angry Affect (typically observed): Angry, Agitated Orientation: : Oriented to Self, Oriented to Place, Oriented to  Time, Oriented to Situation Alcohol / Substance Use: Not Applicable Psych Involvement: No (comment)  Admission diagnosis:  Failure to thrive in adult [R62.7] Intractable pain [R52] Metastasis from breast cancer (HCC) [C79.9, C50.919] Nausea and vomiting, unspecified vomiting type [R11.2] Patient Active Problem List   Diagnosis Date Noted   Failure to thrive in adult 06/19/2024   Malnutrition of moderate degree 08/17/2023   GAVE (gastric antral vascular ectasia) 08/17/2023   Gastric erosion 08/17/2023   Melena 08/16/2023   Anemia associated with acute blood loss 08/16/2023   Peripheral edema 12/29/2022   Restless leg syndrome 10/03/2021   Anxiety associated with depression 06/10/2020   Port-A-Cath in place 11/12/2018   Malignant neoplasm of upper-outer quadrant of right breast in female, estrogen receptor positive (HCC) 08/08/2018   Glaucoma suspect of both eyes 02/20/2017   Vitamin B12 deficiency 05/16/2016   Chronic back pain 05/15/2016   Constipation 05/15/2016   Frequent falls 05/15/2016   Right knee pain 03/01/2015   Aortic atherosclerosis 12/28/2014   Obesity (BMI 30-39.9) 04/09/2014   Insomnia due to stress 04/09/2014   Paroxysmal atrial fibrillation (HCC) 10/01/2013   GERD (gastroesophageal reflux disease) 10/01/2013  Bipolar disorder (HCC) 09/27/2007   Essential hypertension 09/27/2007   ALLERGIC RHINITIS 09/27/2007   IRRITABLE BOWEL SYNDROME 09/27/2007   Arthropathy 09/27/2007   DEGENERATIVE DISC DISEASE, LUMBOSACRAL SPINE  09/27/2007   PCP:  [name], FNP Pharmacy:   [id number] - [place], [place] - [address] [place] [address] Phone: [phone] Fax: [phone]  [place] [facility] [address] [place] [address] Phone: [phone] Fax: [phone]  [facility] [place], [place] - [address] [place] [address] Phone: [phone] Fax: [phone]  [facility] [place] [facility] [address] [place] [address] Phone: [phone] Fax: [phone]     Social Drivers of Health (SDOH) Social History: SDOH Screenings   Food Insecurity: No Food Insecurity (06/19/2024)  Housing: Low Risk (06/19/2024)  Transportation Needs: No Transportation Needs (06/19/2024)  Utilities: Not At Risk (06/19/2024)  Alcohol Screen: Low Risk (08/18/2022)  Depression (PHQ2-9): High Risk (05/16/2024)  Financial Resource Strain: Patient Declined (10/22/2023)  Physical Activity: Unknown (10/22/2023)  Social Connections: Moderately Isolated (06/19/2024)  Stress: Stress Concern Present (10/22/2023)  Tobacco Use: Low Risk (06/19/2024)   SDOH Interventions:     Readmission Risk Interventions     No data to display

## 2024-06-20 NOTE — Plan of Care (Signed)

## 2024-06-21 ENCOUNTER — Observation Stay (HOSPITAL_COMMUNITY)

## 2024-06-21 ENCOUNTER — Other Ambulatory Visit: Payer: Self-pay | Admitting: Hematology and Oncology

## 2024-06-21 DIAGNOSIS — F418 Other specified anxiety disorders: Secondary | ICD-10-CM | POA: Diagnosis not present

## 2024-06-21 DIAGNOSIS — R627 Adult failure to thrive: Secondary | ICD-10-CM | POA: Diagnosis not present

## 2024-06-21 DIAGNOSIS — F319 Bipolar disorder, unspecified: Secondary | ICD-10-CM | POA: Diagnosis not present

## 2024-06-21 DIAGNOSIS — C50919 Malignant neoplasm of unspecified site of unspecified female breast: Secondary | ICD-10-CM | POA: Diagnosis not present

## 2024-06-21 DIAGNOSIS — C7951 Secondary malignant neoplasm of bone: Secondary | ICD-10-CM | POA: Diagnosis not present

## 2024-06-21 DIAGNOSIS — C787 Secondary malignant neoplasm of liver and intrahepatic bile duct: Secondary | ICD-10-CM | POA: Diagnosis not present

## 2024-06-21 DIAGNOSIS — I700 Atherosclerosis of aorta: Secondary | ICD-10-CM | POA: Diagnosis not present

## 2024-06-21 DIAGNOSIS — C50411 Malignant neoplasm of upper-outer quadrant of right female breast: Secondary | ICD-10-CM

## 2024-06-21 LAB — CBC WITH DIFFERENTIAL/PLATELET
Abs Immature Granulocytes: 0.03 K/uL (ref 0.00–0.07)
Basophils Absolute: 0 K/uL (ref 0.0–0.1)
Basophils Relative: 1 %
Eosinophils Absolute: 0 K/uL (ref 0.0–0.5)
Eosinophils Relative: 1 %
HCT: 22.5 % — ABNORMAL LOW (ref 36.0–46.0)
Hemoglobin: 7.2 g/dL — ABNORMAL LOW (ref 12.0–15.0)
Immature Granulocytes: 1 %
Lymphocytes Relative: 10 %
Lymphs Abs: 0.4 K/uL — ABNORMAL LOW (ref 0.7–4.0)
MCH: 31.6 pg (ref 26.0–34.0)
MCHC: 32 g/dL (ref 30.0–36.0)
MCV: 98.7 fL (ref 80.0–100.0)
Monocytes Absolute: 0.2 K/uL (ref 0.1–1.0)
Monocytes Relative: 6 %
Neutro Abs: 3 K/uL (ref 1.7–7.7)
Neutrophils Relative %: 81 %
Platelets: 70 K/uL — ABNORMAL LOW (ref 150–400)
RBC: 2.28 MIL/uL — ABNORMAL LOW (ref 3.87–5.11)
RDW: 17.9 % — ABNORMAL HIGH (ref 11.5–15.5)
WBC: 3.7 K/uL — ABNORMAL LOW (ref 4.0–10.5)
nRBC: 0.8 % — ABNORMAL HIGH (ref 0.0–0.2)

## 2024-06-21 LAB — COMPREHENSIVE METABOLIC PANEL WITH GFR
ALT: 35 U/L (ref 0–44)
AST: 225 U/L — ABNORMAL HIGH (ref 15–41)
Albumin: 2.4 g/dL — ABNORMAL LOW (ref 3.5–5.0)
Alkaline Phosphatase: 418 U/L — ABNORMAL HIGH (ref 38–126)
Anion gap: 8 (ref 5–15)
BUN: 14 mg/dL (ref 8–23)
CO2: 25 mmol/L (ref 22–32)
Calcium: 7.8 mg/dL — ABNORMAL LOW (ref 8.9–10.3)
Chloride: 103 mmol/L (ref 98–111)
Creatinine, Ser: 1 mg/dL (ref 0.44–1.00)
GFR, Estimated: >60 mL/min (ref >60)
Glucose, Bld: 137 mg/dL — ABNORMAL HIGH (ref 70–99)
Potassium: 4.3 mmol/L (ref 3.5–5.1)
Sodium: 136 mmol/L (ref 135–145)
Total Bilirubin: 0.7 mg/dL (ref 0.0–1.2)
Total Protein: 4.8 g/dL — ABNORMAL LOW (ref 6.5–8.1)

## 2024-06-21 LAB — PHOSPHORUS: Phosphorus: 3.4 mg/dL (ref 2.5–4.6)

## 2024-06-21 LAB — MAGNESIUM: Magnesium: 2.1 mg/dL (ref 1.7–2.4)

## 2024-06-21 MED ORDER — IOHEXOL 300 MG/ML  SOLN
100.0000 mL | Freq: Once | INTRAMUSCULAR | Status: AC | PRN
  Administered 2024-06-21: 100 mL via INTRAVENOUS

## 2024-06-21 MED ORDER — DRONABINOL 2.5 MG PO CAPS
2.5000 mg | ORAL_CAPSULE | Freq: Two times a day (BID) | ORAL | Status: DC
  Administered 2024-06-22 – 2024-06-23 (×3): 2.5 mg via ORAL
  Filled 2024-06-21 (×3): qty 1

## 2024-06-21 NOTE — Progress Notes (Signed)
  Cancer Center Cancer Follow up:    Doris Mustard, FNP [address] [place] [address]   DIAGNOSIS:  Cancer Staging  Malignant neoplasm of upper-outer quadrant of right breast in female, estrogen receptor positive (HCC) Staging form: Breast, AJCC 8th Edition - Pathologic: Stage IB (pT3, pN3mi, cM0, G2, ER+, PR+, HER2-) - Signed by Crawford Morna Pickle, NP on 10/02/2018 Multigene prognostic tests performed: MammaPrint Histologic grading system: 3 grade system - Clinical stage from 12/28/2022: Stage IV (rcT2, rcN0, rcM1, G3, ER+, PR+, HER2-) - Signed by Loretha Ash, MD on 12/28/2022 Stage prefix: Recurrence Histologic grading system: 3 grade system Laterality: Right Staged by: Pathologist and managing physician Stage used in treatment planning: Yes National guidelines used in treatment planning: Yes Type of national guideline used in treatment planning: NCCN    SUMMARY OF ONCOLOGIC HISTORY: Oncology History  Malignant neoplasm of upper-outer quadrant of right breast in female, estrogen receptor positive (HCC)  08/08/2018 Initial Diagnosis   status post right breast biopsy 08/02/2018 for a clinical T3 N0, stage IIA invasive lobular carcinoma, grade 2, estrogen receptor strongly positive, progesterone receptor 1% positive, with no HER-2 amplification and an MIB-1 of 1%.             (a) CT scan of the head and chest, without contrast 08/23/2018 showed nonspecific 0.4 cm left lower lobe pulmonary nodule, no definitive metastatic disease             (b) bone scan 08/23/2018 shows multiple spinal areas of abnormal uptake, but             (c) total spinal MRI 09/03/2018 finds bone scan findings to be secondary to degenerative disease, no evidence of metastatic disease.   08/2018 - 10/2022 Anti-estrogen oral therapy   Anastrozole ; discontinued 10/14/2018 in preparation for chemotherapy, resumed October 2020   09/16/2018 Surgery   Right mastectomy [facility]rey)  [phone]): Invasive Lobular Carcinoma, 10.5 cm, grade 2, negative margins. 1 of 5 lymph nodes positive for carcinoma.   09/16/2018 Miscellaneous   MammaPrint high risk suggests a 5-year metastasis free survival of 93% with chemotherapy, with an absolute chemotherapy benefit in the greater than 12% range    09/16/2018 Miscellaneous   Caris testing on mastectomy sample (09/16/2018) showed stable MSI and proficient mismatch repair status, with a low mutational burden; BRCA 1 and 2 were not mutated, PI K3 was not mutated, ER B B2 was not mutated, and AKT 1 was not mutated. 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 Administration: 0.25 mg (11/05/2018), 0.25 mg (11/26/2018), 0.25 mg (12/17/2018), 0.25 mg (01/07/2019), 0.25 mg (01/28/2019), 0.25 mg (02/18/2019), 0.25 mg (03/11/2019), 0.25 mg (04/02/2019)  methotrexate  (PF) chemo injection 84 mg, 39.8 mg/m2 = 84.5 mg, Intravenous,  Once, 5 of 5 cycles. Administration: 84 mg (11/05/2018), 84 mg (11/26/2018), 84 mg (12/17/2018), 84 mg (03/11/2019), 84 mg (04/02/2019)  pegfilgrastim -cbqv (UDENYCA ) injection 6 mg, 6 mg, Subcutaneous, Once, 6 of 6 cycles. Administration: 6 mg (12/19/2018)  cyclophosphamide  (CYTOXAN ) 1,260 mg in sodium chloride  0.9 % 250 mL chemo infusion, 600 mg/m2 = 1,260 mg, Intravenous,  Once, 8 of 8 cycle. Administration: 1,260 mg (11/05/2018), 1,260 mg (11/26/2018), 1,260 mg (12/17/2018), 1,260 mg (01/07/2019), 1,260 mg (01/28/2019), 1,260 mg (02/18/2019), 1,260 mg (03/11/2019), 1,260 mg (04/02/2019)  fluorouracil  (ADRUCIL ) chemo injection  1,250 mg, 600 mg/m2 = 1,250 mg, Intravenous,  Once, 8 of 8 cycles. Administration: 1,250 mg (11/05/2018), 1,250 mg (11/26/2018), 1,250 mg (12/17/2018), 1,250 mg (01/07/2019), 1,250 mg (01/28/2019), 1,250 mg (02/18/2019), 1,250 mg (03/11/2019), 1,250 mg  (04/02/2019).    12/31/2018 - 02/19/2019 Radiation Therapy   The patient initially received a dose of 50.4 Gy in 28 fractions to the chest wall and supraclavicular region. This was delivered using a 3-D conformal, 4 field technique. The patient then received a boost to the mastectomy scar. This delivered an additional 10 Gy in 5 fractions using an en face electron field. The total dose was 60.4 Gy.   09/12/2022 Imaging   Bone scan on 09/12/2022 shows uptake in ribs, manubrium. Widespread osseous metastasis confirmed on PET scan that was completed on October 06, 2022. Right iliac crest biopsy demonstrated metastatic carcinoma consistent with patient's known breast carcinoma. ER 90% positive, PR 90% positive, Ki67 10%, HER2 negative.    10/10/2022 Treatment Plan Change   Faslodex  beginning 10/10/2022; Ibrance  beginning 12/20/2022; Zometa  every 12 weeks 11/07/2022    10/23/2022 - 11/03/2022 Radiation Therapy   Palliative Radiation 10/23/2022-11/03/2022:  left chest wall, lower T spine, and right proximal hip/pelvis were each treated to 30 GY in 10 fractions.      CURRENT THERAPY: Elacestrant , Zometa  (every 12 weeks)  INTERVAL HISTORY:  This is a very well known pt to me admitted with failure to thrive. Since her most recent clinic visit, she looks so much better. She doesn't have any more dry heaves, she feels that her appetite is better. She was sitting in chair at the time of my appointment. She is very willing to try more treatments if the imaging shows progression.   Patient Active Problem List   Diagnosis Date Noted   Failure to thrive in adult 06/19/2024   Malnutrition of moderate degree 08/17/2023   GAVE (gastric antral vascular ectasia) 08/17/2023   Gastric erosion 08/17/2023   Melena 08/16/2023   Anemia associated with acute blood loss 08/16/2023   Peripheral edema 12/29/2022   Restless leg syndrome 10/03/2021   Anxiety associated with depression 06/10/2020   Port-A-Cath in place 11/12/2018    Malignant neoplasm of upper-outer quadrant of right breast in female, estrogen receptor positive (HCC) 08/08/2018   Glaucoma suspect of both eyes 02/20/2017   Vitamin B12 deficiency 05/16/2016   Chronic back pain 05/15/2016   Constipation 05/15/2016   Frequent falls 05/15/2016   Right knee pain 03/01/2015   Aortic atherosclerosis 12/28/2014   Obesity (BMI 30-39.9) 04/09/2014   Insomnia due to stress 04/09/2014   Paroxysmal atrial fibrillation (HCC) 10/01/2013   GERD (gastroesophageal reflux disease) 10/01/2013   Bipolar disorder (HCC) 09/27/2007   Essential hypertension 09/27/2007   ALLERGIC RHINITIS 09/27/2007   IRRITABLE BOWEL SYNDROME 09/27/2007   Arthropathy 09/27/2007   DEGENERATIVE DISC DISEASE, LUMBOSACRAL SPINE 09/27/2007    is allergic to iohexol , codeine, palonosetron , prednisone , sulfonamide derivatives, celecoxib, sulfa antibiotics, and vitamin b12.  MEDICAL HISTORY: Past Medical History:  Diagnosis Date   A-fib (HCC) 11/2012   PAF   Anxiety    takes Ativan    Asthma 04/07/2011   dx   Bipolar disorder (HCC)    Cancer (HCC)    Right breast   Depression    Early cataracts, bilateral    Fatty liver    Fibromyalgia    GERD (gastroesophageal reflux disease)    Irritable bowel syndrome    Mental disorder    dx bipolar   PONV (postoperative nausea  and vomiting)    Restless leg syndrome 10/03/2021   Sleep apnea 04/2011   does not wear CPAP   Tardive dyskinesia    Vitamin B12 deficiency 05/16/2016    SURGICAL HISTORY: Past Surgical History:  Procedure Laterality Date   ABDOMINAL HYSTERECTOMY     BIOPSY  08/17/2023   Procedure: BIOPSY;  Surgeon: Avram Lupita BRAVO, MD;  Location: [facility];  Service: Gastroenterology;;   COLONOSCOPY     DILATION AND CURETTAGE OF UTERUS  1981   abnormal pap   ESOPHAGOGASTRODUODENOSCOPY (EGD) WITH PROPOFOL  N/A 08/17/2023   Procedure: ESOPHAGOGASTRODUODENOSCOPY (EGD) WITH PROPOFOL ;  Surgeon: Avram Lupita BRAVO, MD;  Location: [facility];  Service: Gastroenterology;  Laterality: N/A;   HOT HEMOSTASIS N/A 08/17/2023   Procedure: HOT HEMOSTASIS (ARGON PLASMA COAGULATION/BICAP);  Surgeon: Avram Lupita BRAVO, MD;  Location: [facility];  Service: Gastroenterology;  Laterality: N/A;   IR IMAGING GUIDED PORT INSERTION  11/20/2023   IR THORACENTESIS ASP PLEURAL SPACE W/IMG GUIDE  09/13/2023   IR THORACENTESIS ASP PLEURAL SPACE W/IMG GUIDE  11/20/2023   IR THORACENTESIS ASP PLEURAL SPACE W/IMG GUIDE  03/20/2024   IR THORACENTESIS ASP PLEURAL SPACE W/IMG GUIDE  05/06/2024   IR THORACENTESIS ASP PLEURAL SPACE W/IMG GUIDE  05/28/2024   KNEE ARTHROSCOPY Left 2007   x 2   LUMBAR LAMINECTOMY/DECOMPRESSION MICRODISCECTOMY  05/31/2011   Procedure: LUMBAR LAMINECTOMY/DECOMPRESSION MICRODISCECTOMY;  Surgeon: Reyes JAYSON Billing;  Location: [facility];  Service: Orthopedics;  Laterality: Left;  Decompression Lumbar four to five and  Lumbar five to Sacral one on Left  (X-Ray)   MASTECTOMY W/ SENTINEL NODE BIOPSY Right 09/16/2018   Procedure: RIGHT MASTECTOMY WITH RIGHT AXILLARY SENTINEL LYMPH NODE BIOPSY;  Surgeon: Ethyl Lenis, MD;  [facility];  Service: General;  Laterality: Right;   PARTIAL HYSTERECTOMY  1982   PORTACATH PLACEMENT Left 10/31/2018   Procedure: INSERTION PORT-A-CATH WITH ULTRASOUND;  Surgeon: Ethyl Lenis, MD;  Location: [facility];  Service: General;  Laterality: Left;   TONSILLECTOMY     as child   TOTAL KNEE ARTHROPLASTY Left 12/18/2005    SOCIAL HISTORY: Social History   Socioeconomic History   Marital status: Married    Spouse name: Lynwood   Number of children: 2   Years of education: 12   Highest education level: Not on file  Occupational History   Occupation: Disabled  Tobacco Use   Smoking status: Never   Smokeless tobacco: Never  Vaping Use   Vaping status: Never Used  Substance and Sexual Activity   Alcohol  use: No   Drug use: No   Sexual activity: Yes    Birth control/protection:  Post-menopausal, Surgical  Other Topics Concern   Not on file  Social History Narrative   Tea daily.  Rarely has caffeine    Social Drivers of Health   Tobacco Use: Low Risk (06/19/2024)   Patient History    Smoking Tobacco Use: Never    Smokeless Tobacco Use: Never    Passive Exposure: Not on file  Financial Resource Strain: Patient Declined (10/22/2023)   Overall Financial Resource Strain (CARDIA)    Difficulty of Paying Living Expenses: Patient declined  Food Insecurity: No Food Insecurity (06/19/2024)   Epic    Worried About Programme Researcher, Broadcasting/film/video in the Last Year: Never true    Ran Out of Food in the Last Year: Never true  Transportation Needs: No Transportation Needs (06/19/2024)   Epic    Lack of Transportation (Medical): No  Lack of Transportation (Non-Medical): No  Physical Activity: Unknown (10/22/2023)   Exercise Vital Sign    Days of Exercise per Week: 0 days    Minutes of Exercise per Session: Not on file  Stress: Stress Concern Present (10/22/2023)   [employer] of Occupational Health - Occupational Stress Questionnaire    Feeling of Stress : To some extent  Social Connections: Moderately Isolated (06/19/2024)   Social Connection and Isolation Panel    Frequency of Communication with Friends and Family: More than three times a week    Frequency of Social Gatherings with Friends and Family: More than three times a week    Attends Religious Services: Patient declined    Active Member of Clubs or Organizations: No    Attends Banker Meetings: Never    Marital Status: Married  Catering Manager Violence: Not At Risk (06/19/2024)   Epic    Fear of Current or Ex-Partner: No    Emotionally Abused: No    Physically Abused: No    Sexually Abused: No  Depression (PHQ2-9): High Risk (05/16/2024)   Depression (PHQ2-9)    PHQ-2 Score: 13  Alcohol  Screen: Low Risk (08/18/2022)   Alcohol  Screen    Last Alcohol  Screening Score (AUDIT): 0  Housing: Low Risk  (06/19/2024)   Epic    Unable to Pay for Housing in the Last Year: No    Number of Times Moved in the Last Year: 0    Homeless in the Last Year: No  Utilities: Not At Risk (06/19/2024)   Epic    Threatened with loss of utilities: No  Health Literacy: Not on file    FAMILY HISTORY: Family History  Problem Relation Age of Onset   Anesthesia problems Mother    Heart disease Mother        CHF, atrial fib   Heart failure Mother    Anesthesia problems Sister    Colon polyps Father    Heart disease Father        Fluid around the heart   Hypertension Sister    Breast cancer Maternal Aunt    Colon cancer Maternal Aunt    Prostate cancer Neg Hx    Ovarian cancer Neg Hx       PHYSICAL EXAMINATION    Vitals:   06/20/24 1152 06/20/24 2005  BP: (!) 111/52 (!) 102/55  Pulse: 72 71  Resp:  18  Temp: 97.9 F (36.6 C) 98.3 F (36.8 C)  SpO2: 100%     She appears much better, sitting in chair, smiling No dry heaves Very comfortable.  LABORATORY DATA:  CBC    Component Value Date/Time   WBC 3.7 (L) 06/21/2024 0147   RBC 2.28 (L) 06/21/2024 0147   HGB 7.2 (L) 06/21/2024 0147   HGB 8.4 (L) 06/19/2024 0903   HGB 12.0 06/05/2022 1024   HCT 22.5 (L) 06/21/2024 0147   HCT 37.4 06/05/2022 1024   PLT 70 (L) 06/21/2024 0147   PLT 92 (L) 06/19/2024 0903   PLT 251 06/05/2022 1024   MCV 98.7 06/21/2024 0147   MCV 86 06/05/2022 1024   MCH 31.6 06/21/2024 0147   MCHC 32.0 06/21/2024 0147   RDW 17.9 (H) 06/21/2024 0147   RDW 15.5 (H) 06/05/2022 1024   LYMPHSABS 0.4 (L) 06/21/2024 0147   LYMPHSABS 0.9 06/05/2022 1024   MONOABS 0.2 06/21/2024 0147   EOSABS 0.0 06/21/2024 0147   EOSABS 0.1 06/05/2022 1024   BASOSABS 0.0 06/21/2024 0147   BASOSABS  0.1 06/05/2022 1024    CMP     Component Value Date/Time   NA 136 06/21/2024 0147   NA 145 (H) 06/05/2022 1024   K 4.3 06/21/2024 0147   CL 103 06/21/2024 0147   CO2 25 06/21/2024 0147   GLUCOSE 137 (H) 06/21/2024 0147    BUN 14 06/21/2024 0147   BUN 17 06/05/2022 1024   CREATININE 1.00 06/21/2024 0147   CREATININE 1.04 (H) 06/19/2024 0903   CALCIUM 7.8 (L) 06/21/2024 0147   PROT 4.8 (L) 06/21/2024 0147   PROT 6.6 06/05/2022 1024   ALBUMIN  2.4 (L) 06/21/2024 0147   ALBUMIN  4.0 06/05/2022 1024   AST 225 (H) 06/21/2024 0147   AST 241 (HH) 06/19/2024 0903   ALT 35 06/21/2024 0147   ALT 37 06/19/2024 0903   ALKPHOS 418 (H) 06/21/2024 0147   BILITOT 0.7 06/21/2024 0147   BILITOT 0.9 06/19/2024 0903   GFRNONAA >60 06/21/2024 0147   GFRNONAA 57 (L) 06/19/2024 0903   GFRAA 81 02/03/2020 1049   GFRAA >60 12/19/2018 1434     ASSESSMENT and THERAPY PLAN:   No problem-specific Assessment & Plan notes found for this encounter. Assessment and Plan Assessment & Plan Metastatic breast cancer with liver involvement, bone involvement and malignant pleural effusion Started elacestrant  on Oct 9 th. Significant clinical decline. Tumor markers more than doubled I am concerned about tumor progression She has borderline PS but insists on trying more treatment We will repeat Guardant 360 on blood and can also consider repeat biopsy Other options would be try to single agent paclitaxel or dose reduced enhertu ( last biopsy in Feb 2025 with Her 2 2+) I am leaning towards dose reduced enhertu, since she appears to be rapidly progressing, we can start treating her after christmas and arrange for simultaneous biopsy.  Cancer-related anorexia, weight loss, and vomiting Three weeks of persistent dry heaving, nausea, and minimal oral intake, resulting in weight loss and functional decline.  Nausea improving, palliative care management working on optimizing pain control Can consider low dose prednisone  for appetite stimulation outpt  We discussed goals of care once again very clearly. If her PS deteriorates, then we will consider transitioning to palliative care.  All questions were answered. The patient knows to call the  clinic with any problems, questions or concerns. We can certainly see the patient much sooner if necessary.  Total encounter time:35 minutes*including chart review, labs counseling, documentation and treatment planning.   *Total Encounter Time as defined by the Centers for Medicare and Medicaid Services includes, in addition to the face-to-face time of a patient visit (documented in the note above) non-face-to-face time: obtaining and reviewing outside history, ordering and reviewing medications, tests or procedures, care coordination (communications with other health care professionals or caregivers) and documentation in the medical record.

## 2024-06-21 NOTE — Progress Notes (Signed)
 Concern for progression Stop orserdu  Last biopsy with Her 2 2+, so will proceed with enhertu.  Doris Smith

## 2024-06-21 NOTE — Progress Notes (Signed)
 DISCONTINUE OFF PATHWAY REGIMEN - Breast   [id number]:CMF (IV cyclophosphamide ) q21 days:   A cycle is every 21 days:     Cyclophosphamide       Methotrexate       5-Fluorouracil    **Always confirm dose/schedule in your pharmacy ordering system**  PRIOR TREATMENT: Off Pathway: CMF (IV cyclophosphamide ) q21 days  START OFF PATHWAY REGIMEN - Breast   [id number]:Fam-trastuzumab deruxtecan-nxki 5.4 mg/kg IV D1 q21 Days:   A cycle is every 21 days:     Fam-trastuzumab deruxtecan-nxki   **Always confirm dose/schedule in your pharmacy ordering system**  Patient Characteristics: Distant Metastases or Locoregional Recurrent Disease - Unresectable, M0 or Locally Advanced Unresectable Disease Progressing after Neoadjuvant and Local Therapies, M0, HER2 Negative/Ultralow/Low, ER Positive, Molecular Alterations (BRAFm, MSI-H/dMMR,  TMB-H, or NTRK/RET Gene Fusion), All Mutations/Biomarkers Negative Therapeutic Status: Distant Metastases ER Status: Positive (+) PR Status: Negative (-) HER2 Status: Low Therapy Approach Indicated: Standard Therapy Exhausted and Molecular Alterations Present (BRAFm, MSI-H/dMMR, TMB-H, or NTRK/RET Gene Fusion) BRAF V600E Mutation Status: Negative Microsatellite/Mismatch Repair Status: MSS/pMMR Tumor Mutational Burden (TMB): TMB-L NTRK Gene Fusion Status: Negative RET Gene Fusion Status: Negative Other Mutations/Biomarkers: No Other Actionable Mutations Intent of Therapy: Non-Curative / Palliative Intent, Not Discussed with Patient

## 2024-06-21 NOTE — Progress Notes (Signed)
 PROGRESS NOTE    Doris Smith  FMW:[id number] DOB: 1951/03/27 DOA: 06/19/2024 PCP: [name], FNP   Brief Narrative:  Doris Smith is a 73 y.o. female with medical history significant of paroxysmal atrial fibrillation, aortic atherosclerosis, anxiety, depression, bipolar disorder, asthma, fatty liver disease, fibromyalgia, glaucoma, gastric erosion, GERD, irritable bowel syndrome, sleep apnea not on CPAP, restless leg syndrome, tardive dyskinesia, vitamin B12 deficiency, stage IV breast cancer who presented to the emergency department with back pain, nausea, decreased oral intake and overall failure to thrive.    Assessment and Plan:  Failure to thrive in adult: . Analgesics as needed. Antiemetics as needed. Trial of Periactin tonight. Follow CBC, CMP in AM. Consult TOC team. Consider PT/OT evaluation. Consider nutritional services consult. -C/w IVF w/ LR @ 83 mL/hr x 1 Day  -W/U as below   Bipolar disorder (HCC) / Anxiety associated with depression: Continue Olanzapine  5 mg p.o. bedtime and Continue Lorazepam  1 mg p.o. twice daily.   Essential Hypertension: Monitor blood pressure.   Paroxysmal atrial fibrillation (HCC): Normal rate and rhythm.   Aortic atherosclerosis: Will defer statin given advanced malignancy.   Malignant neoplasm of upper-outer quadrant of right breast in female, estrogen receptor positive (HCC): Continue Orserdu  345 mg p.o. daily. Oncology consulted and since patient's tumor markers have doubled there is concern for disease progression so the oncology team is ordering a CT contrasted scans (CT Chest/Abd/Pelvis) with prophylaxis and if this scan showed worsening cancer then the recommendation will be for inpatient palliative care medicine   Restless Leg Syndrome: Continue Pramipexole  0.5 mg 3 times daily.   Chronic back pain: Continue OxyContin  15 mg p.o. twice daily.   Constipation: Continue MiraLAX  every day as  needed.  Pancytopenia: CBC Trend:  Recent Labs  Lab 05/22/24 0939 06/19/24 0903 06/19/24 0903 06/20/24 1028 06/21/24 0147  WBC 3.8* 4.3  --  3.4* 3.7*  HGB 8.4* 8.4*   < > 7.0* 7.2*  HCT 25.3* 25.0*  --  21.9* 22.5*  MCV 97.3 94.0  --  99.1 98.7  PLT 127* 92*   < > 76* 70*   < > = values in this interval not displayed.  -CTM for S/Sx of Bleeding; Transfuse for Hgb < 7.0. CTM For S/Sx of Bleeding; No overt bleeding noted. Repeat CBC in the AM  Hypoalbuminemia: Patient's Albumin  Lvl went from 2.9 -> 2.8 -> 2.4. CTM & Trend & repeat CMP in the AM  GERD (gastroesophageal reflux disease)/GI Prophylaxis: Continue PPI w/ Pantoprazole  40 mg po BID and Sucralfate  1 gram po TIDwm and HS.  DVT prophylaxis: SCDs    Code Status: Limited: Do not attempt resuscitation (DNR) -DNR-LIMITED -Do Not Intubate/DNI  Family Communication: D/w Husband @ bedside  Disposition Plan:  Level of care: Med-Surg Status is: Observation The patient will require care spanning > 2 midnights and should be moved to inpatient because: Needs further clinical improvement\   Consultants:  Medical Oncology  Procedures:  As delineated as above  Antimicrobials:  Anti-infectives (From admission, onward)    None       Subjective: Seen and examined and thinks she is feeling a little bit better. Eating a little bit more. No Nausea or Vomiting. Denied any CP. No other concerns or complaints at this time.   Objective: Vitals:   06/20/24 0324 06/20/24 1152 06/20/24 2005 06/21/24 1302  BP: (!) 99/48 (!) 111/52 (!) 102/55 (!) 97/52  Pulse: 61 72 71 66  Resp: 18  18 18   Temp: ROLLEN)  97.5 F (36.4 C) 97.9 F (36.6 C) 98.3 F (36.8 C) 98 F (36.7 C)  TempSrc: Oral  Axillary   SpO2: 100% 100%  99%  Weight:      Height:        Intake/Output Summary (Last 24 hours) at 06/21/2024 1822 Last data filed at 06/20/2024 1930 Gross per 24 hour  Intake --  Output 0 ml  Net 0 ml   Filed Weights   06/19/24 1050   Weight: 64.3 kg   Examination: Physical Exam:  Constitutional: Chronically ill-appearing Caucasian female who appears fatigued and in no acute distress  Respiratory: Diminished to auscultation bilaterally, no wheezing, rales, rhonchi or crackles. Normal respiratory effort and patient is not tachypenic. No accessory muscle use.  Unlabored breathing Cardiovascular: RRR, no murmurs / rubs / gallops.  Mild lower extremity edema Abdomen: Soft, non-tender, non-distended. Bowel sounds positive.  GU: Deferred. Musculoskeletal: No clubbing / cyanosis of digits/nails. No joint deformity upper and lower extremities.  Skin: No rashes, lesions, ulcers on limited skin evaluation. No induration; Warm and dry.  Neurologic: CN 2-12 grossly intact with no focal deficits. Romberg sign and cerebellar reflexes not assessed.  Psychiatric: Appears calm and fatigued  Data Reviewed: I have personally reviewed following labs and imaging studies  CBC: Recent Labs  Lab 06/19/24 0903 06/20/24 1028 06/21/24 0147  WBC 4.3 3.4* 3.7*  NEUTROABS 3.2 2.4 3.0  HGB 8.4* 7.0* 7.2*  HCT 25.0* 21.9* 22.5*  MCV 94.0 99.1 98.7  PLT 92* 76* 70*   Basic Metabolic Panel: Recent Labs  Lab 06/19/24 0903 06/20/24 1028 06/21/24 0147  NA 136 137 136  K 3.9 3.8 4.3  CL 101 104 103  CO2 24 24 25   GLUCOSE 95 132* 137*  BUN 16 15 14   CREATININE 1.04* 1.02* 1.00  CALCIUM 7.9* 7.5* 7.8*  MG  --  2.1 2.1  PHOS  --  3.5 3.4   GFR: Estimated Creatinine Clearance: 44.6 mL/min (by C-G formula based on SCr of 1 mg/dL). Liver Function Tests: Recent Labs  Lab 06/19/24 0903 06/20/24 1028 06/21/24 0147  AST 241* 217* 225*  ALT 37 33 35  ALKPHOS 398* 371* 418*  BILITOT 0.9 0.7 0.7  PROT 5.5* 4.6* 4.8*  ALBUMIN  2.8* 2.4* 2.4*   No results for input(s): LIPASE, AMYLASE in the last 168 hours. No results for input(s): AMMONIA in the last 168 hours. Coagulation Profile: No results for input(s): INR, PROTIME in  the last 168 hours. Cardiac Enzymes: No results for input(s): CKTOTAL, CKMB, CKMBINDEX, TROPONINI in the last 168 hours. BNP (last 3 results) No results for input(s): PROBNP in the last 8760 hours. HbA1C: No results for input(s): HGBA1C in the last 72 hours. CBG: No results for input(s): GLUCAP in the last 168 hours. Lipid Profile: No results for input(s): CHOL, HDL, LDLCALC, TRIG, CHOLHDL, LDLDIRECT in the last 72 hours. Thyroid  Function Tests: No results for input(s): TSH, T4TOTAL, FREET4, T3FREE, THYROIDAB in the last 72 hours. Anemia Panel: No results for input(s): VITAMINB12, FOLATE, FERRITIN, TIBC, IRON, RETICCTPCT in the last 72 hours. Sepsis Labs: No results for input(s): PROCALCITON, LATICACIDVEN in the last 168 hours.  No results found for this or any previous visit (from the past 240 hours).   Radiology Studies: CT CHEST ABDOMEN PELVIS W CONTRAST Result Date: 06/21/2024 EXAM: CT CHEST, ABDOMEN AND PELVIS WITH CONTRAST 06/21/2024 10:01:56 AM TECHNIQUE: CT of the chest, abdomen and pelvis was performed with the administration of 100 mL of iohexol  (OMNIPAQUE ) 300  MG/ML solution. Multiplanar reformatted images are provided for review. Automated exposure control, iterative reconstruction, and/or weight based adjustment of the mA/kV was utilized to reduce the radiation dose to as low as reasonably achievable. COMPARISON: CT abdomen/pelvis dated 05/17/2024 and PET CT dated 04/03/2024. CLINICAL HISTORY: Breast cancer, invasive, stage IV, assess treatment response; metastatic breast cancer. FINDINGS: CHEST: MEDIASTINUM AND LYMPH NODES: Heart and pericardium are unremarkable. Right chest port terminates in the upper right atrium. The central airways are clear. No mediastinal, hilar or axillary lymphadenopathy. LUNGS AND PLEURA: Large bilateral pleural effusions, chronic but mildly progressive. Associated compressive atelectasis in the  bilateral lower lobes. Stable nodular/plate-like opacity in the posterior left upper lobe (image 42), non FDG avid on prior PET, likely reflecting nodular scarring. No focal consolidation or pulmonary edema. No pneumothorax. ABDOMEN AND PELVIS: LIVER: Innumerable hepatic metastases, measuring up to 5.1 cm in the posterior right hepatic lobe (image 123), grossly unchanged when remeasured in a similar fashion. GALLBLADDER AND BILE DUCTS: Gallbladder is unremarkable. No biliary ductal dilatation. SPLEEN: No acute abnormality. PANCREAS: No acute abnormality. ADRENAL GLANDS: No acute abnormality. KIDNEYS, URETERS AND BLADDER: 11 mm nonobstructing left lower pole renal calculus (image 66), unchanged. Stable moderate left hydronephrosis, favoring UPJ stenosis. No perinephric or periureteral stranding. The bladder is mildly thick-walled, although underdistended. GI AND BOWEL: Large hiatal hernia/inverted intrathoracic stomach. Mild rectal wall thickening, equivocal. Stomach demonstrates no acute abnormality. There is no bowel obstruction. REPRODUCTIVE ORGANS: No acute abnormality. PERITONEUM AND RETROPERITONEUM: Moderate abdominopelvic ascites, progressive. No free air. VASCULATURE: Aorta is normal in caliber. ABDOMINAL AND PELVIS LYMPH NODES: No lymphadenopathy. BONES AND SOFT TISSUES: Body wall edema. Status post right mastectomy. Diffuse sclerotic osseous metastases, unchanged. Status post ORIF of the right femoral neck. IMPRESSION: 1. Status post right mastectomy. 2. Innumerable hepatic metastases, grossly unchanged. 3. Diffuse sclerotic osseous metastases, unchanged. 4. Large bilateral pleural effusions, mildly progressive. 5. Moderate abdominopelvic ascites, progressive. 6. Additional stable ancillary findings, as above. Electronically signed by: Sriyesh Krishnan MD 06/21/2024 05:45 PM EST RP Workstation: [id number]   Scheduled Meds:  Chlorhexidine  Gluconate Cloth  6 each Topical Daily   dronabinol   2.5 mg Oral  BID AC   famotidine   40 mg Oral QHS   feeding supplement  237 mL Oral BID BM   OLANZapine   5 mg Oral QHS   oxyCODONE   15 mg Oral Q12H   pantoprazole   40 mg Oral BID AC   pramipexole   0.5 mg Oral TID   sodium chloride  flush  10-40 mL Intracatheter Q12H   sucralfate   1 g Oral TID WC & HS   Continuous Infusions:   LOS: 0 days   Alejandro Marker, DO [facility] Available via Epic secure chat 7am-7pm After these hours, please refer to coverage provider listed on [email or web address] 06/21/2024, 6:22 PM

## 2024-06-21 NOTE — Plan of Care (Signed)

## 2024-06-22 DIAGNOSIS — Z7189 Other specified counseling: Secondary | ICD-10-CM | POA: Diagnosis not present

## 2024-06-22 DIAGNOSIS — L89312 Pressure ulcer of right buttock, stage 2: Secondary | ICD-10-CM | POA: Diagnosis present

## 2024-06-22 DIAGNOSIS — J910 Malignant pleural effusion: Secondary | ICD-10-CM | POA: Diagnosis present

## 2024-06-22 DIAGNOSIS — Z79899 Other long term (current) drug therapy: Secondary | ICD-10-CM | POA: Diagnosis not present

## 2024-06-22 DIAGNOSIS — G2581 Restless legs syndrome: Secondary | ICD-10-CM | POA: Diagnosis present

## 2024-06-22 DIAGNOSIS — C787 Secondary malignant neoplasm of liver and intrahepatic bile duct: Secondary | ICD-10-CM | POA: Diagnosis present

## 2024-06-22 DIAGNOSIS — F418 Other specified anxiety disorders: Secondary | ICD-10-CM | POA: Diagnosis not present

## 2024-06-22 DIAGNOSIS — I129 Hypertensive chronic kidney disease with stage 1 through stage 4 chronic kidney disease, or unspecified chronic kidney disease: Secondary | ICD-10-CM | POA: Diagnosis present

## 2024-06-22 DIAGNOSIS — D61818 Other pancytopenia: Secondary | ICD-10-CM | POA: Diagnosis present

## 2024-06-22 DIAGNOSIS — E43 Unspecified severe protein-calorie malnutrition: Secondary | ICD-10-CM | POA: Diagnosis present

## 2024-06-22 DIAGNOSIS — G9341 Metabolic encephalopathy: Secondary | ICD-10-CM | POA: Diagnosis present

## 2024-06-22 DIAGNOSIS — K760 Fatty (change of) liver, not elsewhere classified: Secondary | ICD-10-CM | POA: Diagnosis present

## 2024-06-22 DIAGNOSIS — R4589 Other symptoms and signs involving emotional state: Secondary | ICD-10-CM | POA: Diagnosis not present

## 2024-06-22 DIAGNOSIS — Z170 Estrogen receptor positive status [ER+]: Secondary | ICD-10-CM | POA: Diagnosis not present

## 2024-06-22 DIAGNOSIS — R627 Adult failure to thrive: Secondary | ICD-10-CM | POA: Diagnosis not present

## 2024-06-22 DIAGNOSIS — K219 Gastro-esophageal reflux disease without esophagitis: Secondary | ICD-10-CM | POA: Diagnosis not present

## 2024-06-22 DIAGNOSIS — L89156 Pressure-induced deep tissue damage of sacral region: Secondary | ICD-10-CM | POA: Diagnosis present

## 2024-06-22 DIAGNOSIS — C50411 Malignant neoplasm of upper-outer quadrant of right female breast: Secondary | ICD-10-CM | POA: Diagnosis present

## 2024-06-22 DIAGNOSIS — R400 Somnolence: Secondary | ICD-10-CM | POA: Diagnosis not present

## 2024-06-22 DIAGNOSIS — R918 Other nonspecific abnormal finding of lung field: Secondary | ICD-10-CM | POA: Diagnosis not present

## 2024-06-22 DIAGNOSIS — E035 Myxedema coma: Secondary | ICD-10-CM | POA: Diagnosis not present

## 2024-06-22 DIAGNOSIS — H5702 Anisocoria: Secondary | ICD-10-CM | POA: Diagnosis not present

## 2024-06-22 DIAGNOSIS — R569 Unspecified convulsions: Secondary | ICD-10-CM | POA: Diagnosis not present

## 2024-06-22 DIAGNOSIS — C801 Malignant (primary) neoplasm, unspecified: Secondary | ICD-10-CM | POA: Diagnosis not present

## 2024-06-22 DIAGNOSIS — D631 Anemia in chronic kidney disease: Secondary | ICD-10-CM | POA: Diagnosis present

## 2024-06-22 DIAGNOSIS — G893 Neoplasm related pain (acute) (chronic): Secondary | ICD-10-CM | POA: Diagnosis not present

## 2024-06-22 DIAGNOSIS — J45909 Unspecified asthma, uncomplicated: Secondary | ICD-10-CM | POA: Diagnosis present

## 2024-06-22 DIAGNOSIS — C50919 Malignant neoplasm of unspecified site of unspecified female breast: Secondary | ICD-10-CM | POA: Diagnosis not present

## 2024-06-22 DIAGNOSIS — R188 Other ascites: Secondary | ICD-10-CM | POA: Diagnosis present

## 2024-06-22 DIAGNOSIS — W07XXXA Fall from chair, initial encounter: Secondary | ICD-10-CM | POA: Diagnosis not present

## 2024-06-22 DIAGNOSIS — Z711 Person with feared health complaint in whom no diagnosis is made: Secondary | ICD-10-CM | POA: Diagnosis not present

## 2024-06-22 DIAGNOSIS — Z515 Encounter for palliative care: Secondary | ICD-10-CM | POA: Diagnosis not present

## 2024-06-22 DIAGNOSIS — C799 Secondary malignant neoplasm of unspecified site: Secondary | ICD-10-CM | POA: Diagnosis not present

## 2024-06-22 DIAGNOSIS — I700 Atherosclerosis of aorta: Secondary | ICD-10-CM | POA: Diagnosis not present

## 2024-06-22 DIAGNOSIS — F319 Bipolar disorder, unspecified: Secondary | ICD-10-CM | POA: Diagnosis not present

## 2024-06-22 DIAGNOSIS — D849 Immunodeficiency, unspecified: Secondary | ICD-10-CM | POA: Diagnosis present

## 2024-06-22 DIAGNOSIS — Z66 Do not resuscitate: Secondary | ICD-10-CM | POA: Diagnosis not present

## 2024-06-22 DIAGNOSIS — I6782 Cerebral ischemia: Secondary | ICD-10-CM | POA: Diagnosis not present

## 2024-06-22 DIAGNOSIS — N179 Acute kidney failure, unspecified: Secondary | ICD-10-CM | POA: Diagnosis present

## 2024-06-22 DIAGNOSIS — I480 Paroxysmal atrial fibrillation: Secondary | ICD-10-CM | POA: Diagnosis present

## 2024-06-22 DIAGNOSIS — R4182 Altered mental status, unspecified: Secondary | ICD-10-CM | POA: Diagnosis not present

## 2024-06-22 DIAGNOSIS — R930 Abnormal findings on diagnostic imaging of skull and head, not elsewhere classified: Secondary | ICD-10-CM | POA: Diagnosis not present

## 2024-06-22 DIAGNOSIS — C7951 Secondary malignant neoplasm of bone: Secondary | ICD-10-CM | POA: Diagnosis not present

## 2024-06-22 DIAGNOSIS — J90 Pleural effusion, not elsewhere classified: Secondary | ICD-10-CM | POA: Diagnosis not present

## 2024-06-22 DIAGNOSIS — R7989 Other specified abnormal findings of blood chemistry: Secondary | ICD-10-CM | POA: Diagnosis not present

## 2024-06-22 LAB — CBC WITH DIFFERENTIAL/PLATELET
Abs Immature Granulocytes: 0.05 K/uL (ref 0.00–0.07)
Basophils Absolute: 0 K/uL (ref 0.0–0.1)
Basophils Relative: 0 %
Eosinophils Absolute: 0 K/uL (ref 0.0–0.5)
Eosinophils Relative: 0 %
HCT: 22.2 % — ABNORMAL LOW (ref 36.0–46.0)
Hemoglobin: 7.1 g/dL — ABNORMAL LOW (ref 12.0–15.0)
Immature Granulocytes: 1 %
Lymphocytes Relative: 7 %
Lymphs Abs: 0.4 K/uL — ABNORMAL LOW (ref 0.7–4.0)
MCH: 31.6 pg (ref 26.0–34.0)
MCHC: 32 g/dL (ref 30.0–36.0)
MCV: 98.7 fL (ref 80.0–100.0)
Monocytes Absolute: 0.8 K/uL (ref 0.1–1.0)
Monocytes Relative: 12 %
Neutro Abs: 5.1 K/uL (ref 1.7–7.7)
Neutrophils Relative %: 80 %
Platelets: 77 K/uL — ABNORMAL LOW (ref 150–400)
RBC: 2.25 MIL/uL — ABNORMAL LOW (ref 3.87–5.11)
RDW: 18.3 % — ABNORMAL HIGH (ref 11.5–15.5)
WBC: 6.3 K/uL (ref 4.0–10.5)
nRBC: 0 % (ref 0.0–0.2)

## 2024-06-22 LAB — COMPREHENSIVE METABOLIC PANEL WITH GFR
ALT: 35 U/L (ref 0–44)
AST: 226 U/L — ABNORMAL HIGH (ref 15–41)
Albumin: 2.5 g/dL — ABNORMAL LOW (ref 3.5–5.0)
Alkaline Phosphatase: 379 U/L — ABNORMAL HIGH (ref 38–126)
Anion gap: 9 (ref 5–15)
BUN: 15 mg/dL (ref 8–23)
CO2: 24 mmol/L (ref 22–32)
Calcium: 7.8 mg/dL — ABNORMAL LOW (ref 8.9–10.3)
Chloride: 102 mmol/L (ref 98–111)
Creatinine, Ser: 1.16 mg/dL — ABNORMAL HIGH (ref 0.44–1.00)
GFR, Estimated: 50 mL/min — ABNORMAL LOW (ref >60)
Glucose, Bld: 143 mg/dL — ABNORMAL HIGH (ref 70–99)
Potassium: 4 mmol/L (ref 3.5–5.1)
Sodium: 135 mmol/L (ref 135–145)
Total Bilirubin: 0.6 mg/dL (ref 0.0–1.2)
Total Protein: 4.9 g/dL — ABNORMAL LOW (ref 6.5–8.1)

## 2024-06-22 LAB — MAGNESIUM: Magnesium: 2 mg/dL (ref 1.7–2.4)

## 2024-06-22 LAB — PHOSPHORUS: Phosphorus: 3.2 mg/dL (ref 2.5–4.6)

## 2024-06-22 MED ORDER — PROCHLORPERAZINE EDISYLATE 10 MG/2ML IJ SOLN
10.0000 mg | Freq: Four times a day (QID) | INTRAMUSCULAR | Status: DC | PRN
  Administered 2024-06-22 – 2024-06-26 (×4): 10 mg via INTRAVENOUS
  Filled 2024-06-22 (×4): qty 2

## 2024-06-22 MED ORDER — ALBUMIN HUMAN 25 % IV SOLN
25.0000 g | Freq: Once | INTRAVENOUS | Status: AC
  Administered 2024-06-22: 25 g via INTRAVENOUS
  Filled 2024-06-22: qty 100

## 2024-06-22 MED ORDER — LACTATED RINGERS IV SOLN: INTRAVENOUS | Status: DC

## 2024-06-22 MED ORDER — HYDROMORPHONE HCL 1 MG/ML IJ SOLN
0.5000 mg | Freq: Once | INTRAMUSCULAR | Status: AC
  Administered 2024-06-22: 0.5 mg via INTRAVENOUS
  Filled 2024-06-22: qty 0.5

## 2024-06-22 MED ORDER — FUROSEMIDE 10 MG/ML IJ SOLN
40.0000 mg | Freq: Once | INTRAMUSCULAR | Status: AC
  Administered 2024-06-22: 40 mg via INTRAVENOUS
  Filled 2024-06-22: qty 4

## 2024-06-22 NOTE — Progress Notes (Signed)
 PROGRESS NOTE    Latonga Ponder Barfoot  FMW:[id number] DOB: 09-26-1950 DOA: 06/19/2024 PCP: [name], FNP   Brief Narrative:  Doris Smith is a 73 y.o. female with medical history significant of paroxysmal atrial fibrillation, aortic atherosclerosis, anxiety, depression, bipolar disorder, asthma, fatty liver disease, fibromyalgia, glaucoma, gastric erosion, GERD, irritable bowel syndrome, sleep apnea not on CPAP, restless leg syndrome, tardive dyskinesia, vitamin B12 deficiency, stage IV breast cancer who presented to the emergency department with back pain, nausea, decreased oral intake and overall failure to thrive.  Oncology repeating CT Scans and going to change Chemotherapy in the outpatient setting.   Patient has Ascites that his progressive so will give the patient Albumin  and Lasix  as fluid is now stopped. Oncology to make further recommendations.   Assessment and Plan:  Failure to Thrive in Adult:  Analgesics as needed. Antiemetics as needed. Follow CBC, CMP in AM. Consult TOC team. -Obtain PT/OT evaluation and PT recommending Home Health -Obtain Nutritional Services consult. C/w Ensure High Plus; Add Marinol  2.5 mg po BID -IVF w/ LR @ 83 mL/hr x 1 Day stopped and now getting IV Albumin  and Furosemide   -W/U as below   Nausea and Vomiting: Initiate Antiemetics w/ Prochlorperazine  10 mg IV q6hprn N/V  Bipolar disorder (HCC) / Anxiety associated with depression: Continue Olanzapine  5 mg p.o. bedtime and Continue Lorazepam  1 mg p.o. twice daily.   Essential Hypertension: Monitor blood pressure per Protocol. Last BP reading was 112/54   Paroxysmal Atrial Fibrillation [facility]): Normal rate and rhythm.   Aortic Atherosclerosis: Will defer statin given advanced malignancy and given Liver Metastasis and Abnormal LFTs   Malignant Neoplasm of upper-outer quadrant of right breast in female, estrogen receptor positive (HCC): Initially Continued Orserdu  345 mg p.o. daily but  due to concern for progression the Oncology team is stopping this and since her last Bx was Her 2 2+  the Onc team is going to proceed with IV Enhertu q21 Days after Christmas (12/30) and arranging for Simultaneous Bx.  -Oncology consulted and since patient's tumor markers have doubled (CA 15-3 is 1,544.0 and CA 27.29 is 4,452.5) there is concern for disease progression so the oncology team is ordering a CT contrasted scans (CT Chest/Abd/Pelvis) with prophylaxis. This showed Status post right mastectomy. Innumerable hepatic metastases, grossly unchanged. Diffuse sclerotic osseous metastases, unchanged.  Large bilateral pleural effusions, mildly progressive. Moderate abdominopelvic ascites, progressive.  AKI on CKD Stage II: Baseline Cr ~0.6-0.9. BUN/Cr Trend: Recent Labs  Lab 06/19/24 0903 06/20/24 1028 06/21/24 0147 06/22/24 0223  BUN 16 15 14 15   CREATININE 1.04* 1.02* 1.00 1.16*  -IVF w/ LR @ 83 mL/hr now stopped -Will give a Dose of Albumin  and IV Furosemide  given her ascites  -Avoid Nephrotoxic Medications, Contrast Dyes, Hypotension and Dehydration to Ensure Adequate Renal Perfusion and will need to Renally Adjust Meds. CTM & Trend Renal Function carefully & repeat CMP in the AM   Elevated AST: In the setting of Above. AST Trend:  Recent Labs  Lab 06/19/24 0903 06/20/24 1028 06/21/24 0147 06/22/24 0223  AST 241* 217* 225* 226*  -CTM and Trend and repeat CMP in the AM and if necessary will obtain a RUQ U/S and an Acute Hepatitis Panel but will hold off given that she has known Liver Metastasis.    Restless Leg Syndrome: Continue Pramipexole  0.5 mg 3 times daily.   Chronic back pain: Continue OxyContin  15 mg p.o. twice daily.   Constipation: Continue MiraLAX  every day as needed.  Pancytopenia:  CBC Trend:  Recent Labs  Lab 06/19/24 0903 06/19/24 0903 06/20/24 1028 06/21/24 0147 06/22/24 0223  WBC 4.3  --  3.4* 3.7* 6.3  HGB 8.4*   < > 7.0* 7.2* 7.1*  HCT 25.0*  --   21.9* 22.5* 22.2*  MCV 94.0  --  99.1 98.7 98.7  PLT 92*   < > 76* 70* 77*   < > = values in this interval not displayed.  -CTM for S/Sx of Bleeding; Transfuse for Hgb < 7.0. CTM For S/Sx of Bleeding; No overt bleeding noted. Repeat CBC in the AM  Hypoalbuminemia: Patient's Albumin  Lvl went from 2.9 -> 2.8 -> 2.4 x2 -> 2.5. CTM & Trend & repeat CMP in the AM  GERD (gastroesophageal reflux disease)/GI Prophylaxis: Continue PPI w/ Pantoprazole  40 mg po BID and Sucralfate  1 gram po TIDwm and HS.   DVT prophylaxis: SCDs    Code Status: Limited: Do not attempt resuscitation (DNR) -DNR-LIMITED -Do Not Intubate/DNI  Family Communication: D/w Husband @ bedside   Disposition Plan:  Level of care: Med-Surg Status is: Observation The patient remains OBS appropriate and will d/c before 2 midnights.   Consultants:  Medical Oncology   Procedures:  As delineated as above  Antimicrobials:  Anti-infectives (From admission, onward)    None       Subjective: Seen and examined at bedside and was not feeling as well today.  Had vomited earlier.  Resting.  Having some mild abdominal discomfort.  Denies any chest pain or shortness of breath.  No other concerns or complaints at this time.  Objective: Vitals:   06/21/24 1302 06/21/24 1903 06/21/24 2338 06/22/24 0527  BP: (!) 97/52 (!) 103/59 (!) 119/59 (!) 112/54  Pulse: 66 66 65 69  Resp: 18 18 20 19   Temp: 98 F (36.7 C) 97.7 F (36.5 C) 97.6 F (36.4 C) (!) 97.4 F (36.3 C)  TempSrc:  Oral Oral Oral  SpO2: 99% 98% 98% 100%  Weight:      Height:        Intake/Output Summary (Last 24 hours) at 06/22/2024 1511 Last data filed at 06/22/2024 0830 Gross per 24 hour  Intake 483 ml  Output --  Net 483 ml   Filed Weights   06/19/24 1050  Weight: 64.3 kg   Examination: Physical Exam:  Constitutional: Chronically ill-appearing elderly Caucasian female in no acute distress and is resting Respiratory: Diminished to auscultation  bilaterally with some coarse breath sounds and has some crackles noted at the bases but no acute wheezing or rales or rhonchi. Normal respiratory effort and patient is not tachypenic. No accessory muscle use.  Unlabored breathing Cardiovascular: RRR, no murmurs / rubs / gallops. S1 and S2 auscultated.  Mild extremity edema Abdomen: Soft, a little-tender, slightly-distended. Bowel sounds positive.  GU: Deferred. Musculoskeletal: No clubbing / cyanosis of digits/nails. No joint deformity upper and lower extremities.  Skin: No rashes, lesions, ulcers on limited skin evaluation. No induration; Warm and dry.  Neurologic: CN 2-12 grossly intact with no focal deficits. Romberg sign and cerebellar reflexes not assessed.  Psychiatric: Normal judgment and insight. Alert and oriented x 3. Normal mood and appropriate affect.   Data Reviewed: I have personally reviewed following labs and imaging studies  CBC: Recent Labs  Lab 06/19/24 0903 06/20/24 1028 06/21/24 0147 06/22/24 0223  WBC 4.3 3.4* 3.7* 6.3  NEUTROABS 3.2 2.4 3.0 5.1  HGB 8.4* 7.0* 7.2* 7.1*  HCT 25.0* 21.9* 22.5* 22.2*  MCV 94.0 99.1 98.7 98.7  PLT 92* 76* 70* 77*   Basic Metabolic Panel: Recent Labs  Lab 06/19/24 0903 06/20/24 1028 06/21/24 0147 06/22/24 0223  NA 136 137 136 135  K 3.9 3.8 4.3 4.0  CL 101 104 103 102  CO2 24 24 25 24   GLUCOSE 95 132* 137* 143*  BUN 16 15 14 15   CREATININE 1.04* 1.02* 1.00 1.16*  CALCIUM 7.9* 7.5* 7.8* 7.8*  MG  --  2.1 2.1 2.0  PHOS  --  3.5 3.4 3.2   GFR: Estimated Creatinine Clearance: 38.4 mL/min (A) (by C-G formula based on SCr of 1.16 mg/dL (H)). Liver Function Tests: Recent Labs  Lab 06/19/24 0903 06/20/24 1028 06/21/24 0147 06/22/24 0223  AST 241* 217* 225* 226*  ALT 37 33 35 35  ALKPHOS 398* 371* 418* 379*  BILITOT 0.9 0.7 0.7 0.6  PROT 5.5* 4.6* 4.8* 4.9*  ALBUMIN  2.8* 2.4* 2.4* 2.5*   No results for input(s): LIPASE, AMYLASE in the last 168 hours. No results  for input(s): AMMONIA in the last 168 hours. Coagulation Profile: No results for input(s): INR, PROTIME in the last 168 hours. Cardiac Enzymes: No results for input(s): CKTOTAL, CKMB, CKMBINDEX, TROPONINI in the last 168 hours. BNP (last 3 results) No results for input(s): PROBNP in the last 8760 hours. HbA1C: No results for input(s): HGBA1C in the last 72 hours. CBG: No results for input(s): GLUCAP in the last 168 hours. Lipid Profile: No results for input(s): CHOL, HDL, LDLCALC, TRIG, CHOLHDL, LDLDIRECT in the last 72 hours. Thyroid  Function Tests: No results for input(s): TSH, T4TOTAL, FREET4, T3FREE, THYROIDAB in the last 72 hours. Anemia Panel: No results for input(s): VITAMINB12, FOLATE, FERRITIN, TIBC, IRON, RETICCTPCT in the last 72 hours. Sepsis Labs: No results for input(s): PROCALCITON, LATICACIDVEN in the last 168 hours.  No results found for this or any previous visit (from the past 240 hours).   Radiology Studies: CT CHEST ABDOMEN PELVIS W CONTRAST Result Date: 06/21/2024 EXAM: CT CHEST, ABDOMEN AND PELVIS WITH CONTRAST 06/21/2024 10:01:56 AM TECHNIQUE: CT of the chest, abdomen and pelvis was performed with the administration of 100 mL of iohexol  (OMNIPAQUE ) 300 MG/ML solution. Multiplanar reformatted images are provided for review. Automated exposure control, iterative reconstruction, and/or weight based adjustment of the mA/kV was utilized to reduce the radiation dose to as low as reasonably achievable. COMPARISON: CT abdomen/pelvis dated 05/17/2024 and PET CT dated 04/03/2024. CLINICAL HISTORY: Breast cancer, invasive, stage IV, assess treatment response; metastatic breast cancer. FINDINGS: CHEST: MEDIASTINUM AND LYMPH NODES: Heart and pericardium are unremarkable. Right chest port terminates in the upper right atrium. The central airways are clear. No mediastinal, hilar or axillary lymphadenopathy. LUNGS AND PLEURA:  Large bilateral pleural effusions, chronic but mildly progressive. Associated compressive atelectasis in the bilateral lower lobes. Stable nodular/plate-like opacity in the posterior left upper lobe (image 42), non FDG avid on prior PET, likely reflecting nodular scarring. No focal consolidation or pulmonary edema. No pneumothorax. ABDOMEN AND PELVIS: LIVER: Innumerable hepatic metastases, measuring up to 5.1 cm in the posterior right hepatic lobe (image 123), grossly unchanged when remeasured in a similar fashion. GALLBLADDER AND BILE DUCTS: Gallbladder is unremarkable. No biliary ductal dilatation. SPLEEN: No acute abnormality. PANCREAS: No acute abnormality. ADRENAL GLANDS: No acute abnormality. KIDNEYS, URETERS AND BLADDER: 11 mm nonobstructing left lower pole renal calculus (image 66), unchanged. Stable moderate left hydronephrosis, favoring UPJ stenosis. No perinephric or periureteral stranding. The bladder is mildly thick-walled, although underdistended. GI AND BOWEL: Large hiatal hernia/inverted intrathoracic stomach. Mild rectal  wall thickening, equivocal. Stomach demonstrates no acute abnormality. There is no bowel obstruction. REPRODUCTIVE ORGANS: No acute abnormality. PERITONEUM AND RETROPERITONEUM: Moderate abdominopelvic ascites, progressive. No free air. VASCULATURE: Aorta is normal in caliber. ABDOMINAL AND PELVIS LYMPH NODES: No lymphadenopathy. BONES AND SOFT TISSUES: Body wall edema. Status post right mastectomy. Diffuse sclerotic osseous metastases, unchanged. Status post ORIF of the right femoral neck. IMPRESSION: 1. Status post right mastectomy. 2. Innumerable hepatic metastases, grossly unchanged. 3. Diffuse sclerotic osseous metastases, unchanged. 4. Large bilateral pleural effusions, mildly progressive. 5. Moderate abdominopelvic ascites, progressive. 6. Additional stable ancillary findings, as above. Electronically signed by: Sriyesh Krishnan MD 06/21/2024 05:45 PM EST RP Workstation:  [id number]   Scheduled Meds:  Chlorhexidine  Gluconate Cloth  6 each Topical Daily   dronabinol   2.5 mg Oral BID AC   famotidine   40 mg Oral QHS   feeding supplement  237 mL Oral BID BM   furosemide   40 mg Intravenous Once   OLANZapine   5 mg Oral QHS   oxyCODONE   15 mg Oral Q12H   pantoprazole   40 mg Oral BID AC   pramipexole   0.5 mg Oral TID   sodium chloride  flush  10-40 mL Intracatheter Q12H   sucralfate   1 g Oral TID WC & HS   Continuous Infusions:  albumin  human      LOS: 0 days   Alejandro Marker, DO [facility] Available via Epic secure chat 7am-7pm After these hours, please refer to coverage provider listed on [email or web address] 06/22/2024, 3:11 PM

## 2024-06-22 NOTE — Plan of Care (Signed)

## 2024-06-22 NOTE — Evaluation (Signed)
 Physical Therapy Evaluation Patient Details Name: Doris Smith MRN: [id number] DOB: [birth year] Today's Date: 06/22/2024  History of Present Illness  73 yo female admitted with FTT, back pain, nausea. Hx of met breast Ca, afib, anxiety/depression, fibromyalgia, TKA, fatty liver disease, bipolar d/o, IBS, RLS, glaucoma, tardive dyskinesia  Clinical Impression  On eval, pt was CGA for mobility. She ambulated ~20 feet with a RW. Pt presents with general weakness, decreased activity tolerance, and impaired gait/balance. Pt fatigued easily with minimal activity. Husband was present during session. Will continue to follow and progress activity as tolerated. Will recommend HHPT f/u if pt/family are agreeable.         If plan is discharge home, recommend the following: A little help with walking and/or transfers;A little help with bathing/dressing/bathroom;Assistance with cooking/housework;Assist for transportation;Help with stairs or ramp for entrance   Can travel by private vehicle        Equipment Recommendations None recommended by PT  Recommendations for Other Services       Functional Status Assessment Patient has had a recent decline in their functional status and demonstrates the ability to make significant improvements in function in a reasonable and predictable amount of time.     Precautions / Restrictions Precautions Precautions: Fall Restrictions Weight Bearing Restrictions Per Provider Order: No      Mobility  Bed Mobility Overal bed mobility: Needs Assistance Bed Mobility: Supine to Sit, Sit to Supine     Supine to sit: Supervision, HOB elevated Sit to supine: HOB elevated, Supervision        Transfers Overall transfer level: Needs assistance Equipment used: Rolling walker (2 wheels) Transfers: Sit to/from Stand Sit to Stand: Contact guard assist           General transfer comment: Cues for safety, hand placement. Increased time.     Ambulation/Gait Ambulation/Gait assistance: Contact guard assist Gait Distance (Feet): 20 Feet Assistive device: Rolling walker (2 wheels) Gait Pattern/deviations: Step-through pattern, Decreased stride length       General Gait Details: Slow effortful gait. Fatigues easily and quickly. O2 99% on RA, HR 76 bpm  Stairs            Wheelchair Mobility     Tilt Bed    Modified Rankin (Stroke Patients Only)       Balance Overall balance assessment: Needs assistance         Standing balance support: Bilateral upper extremity supported, During functional activity, Reliant on assistive device for balance Standing balance-Leahy Scale: Poor                               Pertinent Vitals/Pain Pain Assessment Pain Assessment: 0-10 Pain Score: 8  Pain Location: back Pain Descriptors / Indicators: Aching Pain Intervention(s): Monitored during session, Limited activity within patient's tolerance, Repositioned    Home Living Family/patient expects to be discharged to:: Private residence Living Arrangements: Spouse/significant other Available Help at Discharge: Family Type of Home: House Home Access: Ramped entrance       Home Layout: One level Home Equipment: [employer]ant (2 wheels);Wheelchair - manual;BSC/3in1      Prior Function Prior Level of Function : Needs assist             Mobility Comments: ambulatory without a device       Extremity/Trunk Assessment   Upper Extremity Assessment Upper Extremity Assessment: Defer to OT evaluation    Lower Extremity Assessment Lower Extremity Assessment:  Generalized weakness    Cervical / Trunk Assessment Cervical / Trunk Assessment: Normal  Communication   Communication Communication: No apparent difficulties    Cognition Arousal: Alert Behavior During Therapy: WFL for tasks assessed/performed   PT - Cognitive impairments: No apparent impairments                          Following commands: Intact       Cueing Cueing Techniques: Verbal cues     General Comments      Exercises General Exercises - Lower Extremity Ankle Circles/Pumps: AROM, Both, 10 reps Quad Sets: AROM, Both, 10 reps   Assessment/Plan    PT Assessment Patient needs continued PT services  PT Problem List Decreased strength;Decreased range of motion;Decreased activity tolerance;Decreased balance;Decreased mobility;Decreased knowledge of use of DME;Pain       PT Treatment Interventions DME instruction;Gait training;Therapeutic activities;Therapeutic exercise;Patient/family education;Functional mobility training;Balance training    PT Goals (Current goals can be found in the Care Plan section)  Acute Rehab PT Goals Patient Stated Goal: less pain. get stronger PT Goal Formulation: With patient/family Time For Goal Achievement: 07/06/24 Potential to Achieve Goals: Good    Frequency Min 3X/week     Co-evaluation               AM-PAC PT 6 Clicks Mobility  Outcome Measure Help needed turning from your back to your side while in a flat bed without using bedrails?: A Little Help needed moving from lying on your back to sitting on the side of a flat bed without using bedrails?: A Little Help needed moving to and from a bed to a chair (including a wheelchair)?: A Little Help needed standing up from a chair using your arms (e.g., wheelchair or bedside chair)?: A Little Help needed to walk in hospital room?: A Little Help needed climbing 3-5 steps with a railing? : A Lot 6 Click Score: 17    End of Session Equipment Utilized During Treatment: Gait belt Activity Tolerance: Patient tolerated treatment well Patient left: in bed;with call bell/phone within reach;with family/visitor present   PT Visit Diagnosis: Pain;Muscle weakness (generalized) (M62.81);Difficulty in walking, not elsewhere classified (R26.2) Pain - part of body:  (back)    Time: 1115-1130 PT Time  Calculation (min) (ACUTE ONLY): 15 min   Charges:   PT Evaluation $PT Eval Low Complexity: 1 Low   PT General Charges $$ ACUTE PT VISIT: 1 Visit           Dannial SQUIBB, PT Acute Rehabilitation  Office: [phone]

## 2024-06-22 NOTE — Evaluation (Signed)
 Occupational Therapy Evaluation Patient Details Name: Doris Smith MRN: [id number] DOB: [birth year] Today's Date: 06/22/2024   History of Present Illness   73 yo female admitted with FTT, back pain, nausea. Hx of met breast Ca, afib, anxiety/depression, fibromyalgia, TKA, fatty liver disease, bipolar d/o, IBS, RLS, glaucoma, tardive dyskinesia     Clinical Impressions PTA, pt lived with spouse and reports being mod I for BADL in the home. Spouse assists with IADL and home making tasks. Upon eval, pt lethargic needing cues to maintain arousal; spouse reports she recently had pain medication. Pt needing up to min A for BADL. Functional mobility with RW at [facility] level progressing to min A with fatigue and ongoing feeling of need for sleep. Returned to chair with BLE up and spouse present. Pt to continue to benefit from acute OT services. Recommending discharge home with HHOT and support from family/      If plan is discharge home, recommend the following:   A little help with walking and/or transfers;A little help with bathing/dressing/bathroom;Assistance with cooking/housework;Assist for transportation;Help with stairs or ramp for entrance     Functional Status Assessment   Patient has had a recent decline in their functional status and demonstrates the ability to make significant improvements in function in a reasonable and predictable amount of time.     Equipment Recommendations   None recommended by OT     Recommendations for Other Services         Precautions/Restrictions   Precautions Precautions: Fall Restrictions Weight Bearing Restrictions Per Provider Order: No     Mobility Bed Mobility Overal bed mobility: Needs Assistance Bed Mobility: Supine to Sit, Sit to Supine     Supine to sit: Supervision, HOB elevated Sit to supine: HOB elevated, Supervision        Transfers Overall transfer level: Needs assistance Equipment used: Rolling walker (2  wheels) Transfers: Sit to/from Stand Sit to Stand: Contact guard assist           General transfer comment: Cues for safety, hand placement. Increased time.      Balance Overall balance assessment: Needs assistance         Standing balance support: Bilateral upper extremity supported, During functional activity, Reliant on assistive device for balance Standing balance-Leahy Scale: Poor Standing balance comment: reliant on RW dynamically                           ADL either performed or assessed with clinical judgement   ADL Overall ADL's : Needs assistance/impaired Eating/Feeding: Set up;Sitting   Grooming: Set up;Sitting   Upper Body Bathing: Set up;Sitting   Lower Body Bathing: Minimal assistance;Sit to/from stand   Upper Body Dressing : Set up;Sitting   Lower Body Dressing: Minimal assistance;Sit to/from stand   Toilet Transfer: Contact guard assist;Minimal assistance;Ambulation;Rolling walker (2 wheels) Toilet Transfer Details (indicate cue type and reason): CGA initially, progressing to min A with ambulation back to chair with fatigue and pt with lethargy secondary to medications         Functional mobility during ADLs: Contact guard assist;Minimal assistance;Rolling walker (2 wheels)       Vision Baseline Vision/History: 1 Wears glasses Ability to See in Adequate Light: 1 Impaired Patient Visual Report: No change from baseline       Perception         Praxis         Pertinent Vitals/Pain Pain Assessment Pain Assessment: Faces Faces Pain  Scale: Hurts little more Pain Location: back Pain Descriptors / Indicators: Aching Pain Intervention(s): Limited activity within patient's tolerance     Extremity/Trunk Assessment Upper Extremity Assessment Upper Extremity Assessment: Generalized weakness   Lower Extremity Assessment Lower Extremity Assessment: Defer to PT evaluation   Cervical / Trunk Assessment Cervical / Trunk Assessment:  Normal   Communication Communication Communication: No apparent difficulties   Cognition Arousal: Alert, Lethargic, Suspect due to medications (recent pain meds) Behavior During Therapy: WFL for tasks assessed/performed Cognition: Cognition impaired     Awareness: Online awareness impaired Memory impairment (select all impairments): Short-term memory, Working memory Attention impairment (select first level of impairment): Sustained attention Executive functioning impairment (select all impairments): Organization, Sequencing, Problem solving OT - Cognition Comments: cues for sustaining tasks                 Following commands: Intact       Cueing  General Comments   Cueing Techniques: Verbal cues      Exercises     Shoulder Instructions      Home Living Family/patient expects to be discharged to:: Private residence Living Arrangements: Spouse/significant other Available Help at Discharge: Family Type of Home: House Home Access: Ramped entrance     Home Layout: One level               Home Equipment: [employer] Consultant (2 wheels);Wheelchair - manual;BSC/3in1          Prior Functioning/Environment Prior Level of Function : Needs assist             Mobility Comments: ambulatory without a device ADLs Comments: pt reports being independent in self care with husband assisting with IADL/home management/cooking    OT Problem List: Decreased strength;Decreased activity tolerance;Impaired balance (sitting and/or standing);Decreased safety awareness;Decreased knowledge of use of DME or AE;Pain   OT Treatment/Interventions: Self-care/ADL training;Therapeutic exercise;DME and/or AE instruction;Therapeutic activities;Patient/family education;Balance training;Energy conservation      OT Goals(Current goals can be found in the care plan section)   Acute Rehab OT Goals Patient Stated Goal: go home OT Goal Formulation: With patient Time For Goal Achievement:  07/06/24 Potential to Achieve Goals: Fair   OT Frequency:  Min 2X/week    Co-evaluation              AM-PAC OT 6 Clicks Daily Activity     Outcome Measure Help from another person eating meals?: None Help from another person taking care of personal grooming?: A Little Help from another person toileting, which includes using toliet, bedpan, or urinal?: A Little Help from another person bathing (including washing, rinsing, drying)?: A Little Help from another person to put on and taking off regular upper body clothing?: A Little Help from another person to put on and taking off regular lower body clothing?: A Little 6 Click Score: 19   End of Session Equipment Utilized During Treatment: Gait belt;Rolling walker (2 wheels) Nurse Communication: Mobility status  Activity Tolerance: Patient tolerated treatment well Patient left: in chair;with call bell/phone within reach;with chair alarm set;with family/visitor present  OT Visit Diagnosis: Unsteadiness on feet (R26.81);Muscle weakness (generalized) (M62.81);Other symptoms and signs involving cognitive function;Pain                Time: 8596-8577 OT Time Calculation (min): 19 min Charges:  OT General Charges $OT Visit: 1 Visit OT Evaluation $OT Eval Moderate Complexity: 1 Mod  Elma JONETTA Lebron FREDERICK, OTR/L [facility] Acute Rehabilitation Office: [phone]   Elma JONETTA Lebron 06/22/2024, 4:34  PM

## 2024-06-23 ENCOUNTER — Inpatient Hospital Stay (HOSPITAL_COMMUNITY)

## 2024-06-23 ENCOUNTER — Other Ambulatory Visit: Payer: Self-pay

## 2024-06-23 DIAGNOSIS — R930 Abnormal findings on diagnostic imaging of skull and head, not elsewhere classified: Secondary | ICD-10-CM | POA: Diagnosis not present

## 2024-06-23 DIAGNOSIS — E43 Unspecified severe protein-calorie malnutrition: Secondary | ICD-10-CM | POA: Insufficient documentation

## 2024-06-23 DIAGNOSIS — Z170 Estrogen receptor positive status [ER+]: Secondary | ICD-10-CM

## 2024-06-23 LAB — CBC WITH DIFFERENTIAL/PLATELET
Abs Immature Granulocytes: 0.07 K/uL (ref 0.00–0.07)
Basophils Absolute: 0 K/uL (ref 0.0–0.1)
Basophils Relative: 0 %
Eosinophils Absolute: 0 K/uL (ref 0.0–0.5)
Eosinophils Relative: 0 %
HCT: 20.9 % — ABNORMAL LOW (ref 36.0–46.0)
Hemoglobin: 6.7 g/dL — CL (ref 12.0–15.0)
Immature Granulocytes: 2 %
Lymphocytes Relative: 8 %
Lymphs Abs: 0.4 K/uL — ABNORMAL LOW (ref 0.7–4.0)
MCH: 31.6 pg (ref 26.0–34.0)
MCHC: 32.1 g/dL (ref 30.0–36.0)
MCV: 98.6 fL (ref 80.0–100.0)
Monocytes Absolute: 0.7 K/uL (ref 0.1–1.0)
Monocytes Relative: 16 %
Neutro Abs: 3.3 K/uL (ref 1.7–7.7)
Neutrophils Relative %: 74 %
Platelets: 64 K/uL — ABNORMAL LOW (ref 150–400)
RBC: 2.12 MIL/uL — ABNORMAL LOW (ref 3.87–5.11)
RDW: 18.6 % — ABNORMAL HIGH (ref 11.5–15.5)
WBC: 4.4 K/uL (ref 4.0–10.5)
nRBC: 1.4 % — ABNORMAL HIGH (ref 0.0–0.2)

## 2024-06-23 LAB — COMPREHENSIVE METABOLIC PANEL WITH GFR
ALT: 37 U/L (ref 0–44)
AST: 246 U/L — ABNORMAL HIGH (ref 15–41)
Albumin: 2.8 g/dL — ABNORMAL LOW (ref 3.5–5.0)
Alkaline Phosphatase: 360 U/L — ABNORMAL HIGH (ref 38–126)
Anion gap: 11 (ref 5–15)
BUN: 19 mg/dL (ref 8–23)
CO2: 24 mmol/L (ref 22–32)
Calcium: 8.1 mg/dL — ABNORMAL LOW (ref 8.9–10.3)
Chloride: 103 mmol/L (ref 98–111)
Creatinine, Ser: 1.24 mg/dL — ABNORMAL HIGH (ref 0.44–1.00)
GFR, Estimated: 46 mL/min — ABNORMAL LOW (ref >60)
Glucose, Bld: 89 mg/dL (ref 70–99)
Potassium: 3.3 mmol/L — ABNORMAL LOW (ref 3.5–5.1)
Sodium: 138 mmol/L (ref 135–145)
Total Bilirubin: 0.7 mg/dL (ref 0.0–1.2)
Total Protein: 4.9 g/dL — ABNORMAL LOW (ref 6.5–8.1)

## 2024-06-23 LAB — PHOSPHORUS: Phosphorus: 3 mg/dL (ref 2.5–4.6)

## 2024-06-23 LAB — PREPARE RBC (CROSSMATCH)

## 2024-06-23 LAB — GLUCOSE, CAPILLARY: Glucose-Capillary: 105 mg/dL — ABNORMAL HIGH (ref 70–99)

## 2024-06-23 LAB — MAGNESIUM: Magnesium: 2.1 mg/dL (ref 1.7–2.4)

## 2024-06-23 LAB — HEMOGLOBIN AND HEMATOCRIT, BLOOD
HCT: 35 % — ABNORMAL LOW (ref 36.0–46.0)
Hemoglobin: 11.7 g/dL — ABNORMAL LOW (ref 12.0–15.0)

## 2024-06-23 MED ORDER — ENSURE PLUS HIGH PROTEIN PO LIQD
237.0000 mL | Freq: Three times a day (TID) | ORAL | Status: DC
  Administered 2024-06-23: 13:00:00 237 mL via ORAL

## 2024-06-23 MED ORDER — FUROSEMIDE 10 MG/ML IJ SOLN
40.0000 mg | Freq: Once | INTRAMUSCULAR | Status: AC
  Administered 2024-06-23: 18:00:00 40 mg via INTRAVENOUS
  Filled 2024-06-23: qty 4

## 2024-06-23 MED ORDER — ALBUMIN HUMAN 25 % IV SOLN
25.0000 g | Freq: Once | INTRAVENOUS | Status: AC
  Administered 2024-06-23: 18:00:00 25 g via INTRAVENOUS
  Filled 2024-06-23: qty 100

## 2024-06-23 MED ORDER — DRONABINOL 5 MG PO CAPS
5.0000 mg | ORAL_CAPSULE | Freq: Two times a day (BID) | ORAL | Status: DC
  Administered 2024-06-23 – 2024-06-24 (×3): 5 mg via ORAL
  Filled 2024-06-23 (×3): qty 2

## 2024-06-23 MED ORDER — SODIUM CHLORIDE 0.9% IV SOLUTION: Freq: Once | INTRAVENOUS | Status: AC

## 2024-06-23 NOTE — Progress Notes (Signed)
 Called to room by nurse tech; patient was sitting on the floor with back against the bed frame; unwitnessed fall; patient assisted back to bed by primary RN and nurse tech; skin tear noted on patient's back; vital signs obtained and noted in chart; primary RN to notify MD and complete safety zone portal; will continue to monitor

## 2024-06-23 NOTE — Progress Notes (Addendum)
 PROGRESS NOTE    Taler Kushner Ghazi  FMW:[id number] DOB: 1950/09/11 DOA: 06/19/2024 PCP: [name], FNP   Brief Narrative:  Doris Smith is a 73 y.o. female with medical history significant of paroxysmal atrial fibrillation, aortic atherosclerosis, anxiety, depression, bipolar disorder, asthma, fatty liver disease, fibromyalgia, glaucoma, gastric erosion, GERD, irritable bowel syndrome, sleep apnea not on CPAP, restless leg syndrome, tardive dyskinesia, vitamin B12 deficiency, stage IV breast cancer who presented to the emergency department with back pain, nausea, decreased oral intake and overall failure to thrive.  Oncology repeating CT Scans and going to change Chemotherapy in the outpatient setting.   Patient has Ascites that his progressive so will give the patient Albumin  and Lasix  as fluid is now stopped. Oncology to make further recommendations.   Assessment and Plan:  Failure to Thrive in Adult: C/w Supportive care w/ Analgesics and Antiemetics as needed. Obtain PT/OT evaluation and PT recommending Home Health for now  -Obtain Nutritional Services consult and see below. C/w Ensure High Plus; Add Marinol  2.5 mg po BID and will increased to 5 mg po BID -IVF w/ LR @ 83 mL/hr x 1 Day stopped and now getting IV Albumin  and Furosemide  again -Getting Type and Screen and 2 units of pRBCs -W/U as below. Will need Palliative Care GOC Discussion   ADDENDUM 18:45: Unwitnessed Fall: Nursing notified that patient slid out of the chair to the floor and was found to be sitting on the floor with back against bed frame. Per nursing Denied hitting head. Husband notified nursing after albumin  was given. Since not witnessed fall will obtain CT Head w/o Contrast. Only noted to have skin tear on patient's back. VSS per nurse and no other issues noted.   Nausea and Vomiting: Initiate Antiemetics w/ Prochlorperazine  10 mg IV q6hprn N/V  Bipolar Disorder (HCC) / Anxiety associated with  depression: Continue Olanzapine  5 mg p.o. bedtime and Continue Lorazepam  1 mg p.o. twice daily.   Essential Hypertension: Monitor blood pressure per Protocol. Last BP reading was 105/58   Paroxysmal Atrial Fibrillation [facility]): Normal rate and rhythm. If necessary Transfer to Telemetry   Aortic Atherosclerosis: Will defer statin given advanced malignancy and given Liver Metastasis and Abnormal LFTs   Malignant Neoplasm of upper-outer quadrant of right breast in female, estrogen receptor positive (HCC): Initially Continued Orserdu  345 mg p.o. daily but due to concern for progression the Oncology team is stopping this and since her last Bx was Her 2 2+  the Onc team is going to proceed with IV Enhertu q21 Days after Christmas (12/30) and arranging for Simultaneous Bx.  -Oncology consulted and since patient's tumor markers have doubled (CA 15-3 is 1,544.0 and CA 27.29 is 4,452.5) there is concern for disease progression so the oncology team is ordering a CT contrasted scans (CT Chest/Abd/Pelvis) with prophylaxis. This showed Status post right mastectomy. Innumerable hepatic metastases, grossly unchanged. Diffuse sclerotic osseous metastases, unchanged. Large bilateral pleural effusions, mildly progressive. Moderate abdominopelvic ascites, progressive.  AKI on CKD Stage II: Baseline Cr ~0.6-0.9. BUN/Cr Trend: Recent Labs  Lab 06/19/24 0903 06/20/24 1028 06/21/24 0147 06/22/24 0223 06/23/24 0241  BUN 16 15 14 15 19   CREATININE 1.04* 1.02* 1.00 1.16* 1.24*  -IVF w/ LR @ 83 mL/hr now stopped -Give a Dose of Albumin  and IV Furosemide  given her ascites; Now getting Blood so will give another dose of Albumin  and IV Furosemide  -Avoid Nephrotoxic Medications, Contrast Dyes, Hypotension and Dehydration to Ensure Adequate Renal Perfusion and will need to Renally Adjust  Meds. CTM & Trend Renal Function carefully & repeat CMP in the AM   Elevated AST: In the setting of Above. AST Trend:  Recent Labs  Lab  06/19/24 0903 06/20/24 1028 06/21/24 0147 06/22/24 0223 06/23/24 0241  AST 241* 217* 225* 226* 246*  -CTM and Trend and repeat CMP in the AM and if necessary will obtain a RUQ U/S and an Acute Hepatitis Panel but will hold off given that she has known Liver Metastasis.    Restless Leg Syndrome: Continue Pramipexole  0.5 mg 3 times daily.   Chronic back pain: Continue OxyContin  15 mg p.o. twice daily and po Oxycodone  5 mg po q6hprn Breakthrough Pain    Constipation: Continue MiraLAX  17 grams every day as needed.  Pancytopenia: CBC Trend:  Recent Labs  Lab 06/19/24 0903 06/19/24 0903 06/20/24 1028 06/21/24 0147 06/22/24 0223 06/23/24 0241  WBC 4.3  --  3.4* 3.7* 6.3 4.4  HGB 8.4*   < > 7.0* 7.2* 7.1* 6.7*  HCT 25.0*  --  21.9* 22.5* 22.2* 20.9*  MCV 94.0  --  99.1 98.7 98.7 98.6  PLT 92*   < > 76* 70* 77* 64*   < > = values in this interval not displayed.  -CTM for S/Sx of Bleeding; Hgb low this AM so will type and screen and transfuse 2 units of pRBCs. Check FOBT. Transfuse for Hgb < 7.0. CTM For S/Sx of Bleeding; No overt bleeding noted. Repeat CBC in the AM  Hypoalbuminemia: Patient's Albumin  Lvl went from 2.9 -> 2.8 -> 2.4 x2 -> 2.5. CTM & Trend & repeat CMP in the AM  GERD (gastroesophageal reflux disease)/GI Prophylaxis: Continue PPI w/ Pantoprazole  40 mg po BID and Sucralfate  1 gram po TIDwm and HS.  Severe Malnutrition in the Context of Chronic illness: Nutrition Status: Nutrition Problem: Severe Malnutrition Etiology: chronic illness (stage IV breast cancer) Signs/Symptoms: moderate fat depletion, severe muscle depletion, percent weight loss (12.7% in 4 months) Percent weight loss: 12.7 % (in 4 months) Interventions: Refer to RD note for recommendations, Ensure Enlive (each supplement provides 350kcal and 20 grams of protein)   DVT prophylaxis: Place and maintain sequential compression device Start: 06/23/24 1056    Code Status: Limited: Do not attempt resuscitation  (DNR) -DNR-LIMITED -Do Not Intubate/DNI  Family Communication: D/w Husband present @ bedside   Disposition Plan:  Level of care: Med-Surg Status is: Inpatient Remains inpatient appropriate because: Needs further clinical improvement and improved appetite     Consultants:  Medical Oncology  Palliative Care Medicine  Procedures:  As delineated as above  Antimicrobials:  Anti-infectives (From admission, onward)    None       Subjective: Seen and examined at bedside and feels very fatigued and lethargic today.  Not feeling as well.  Appetite still remains poor.  Blood count is dropped and agreeable to the blood transfusion.  No overt bleeding noted per the patient.  90 other concerns or complaints at this time and wanting to rest  Objective: Vitals:   06/23/24 1516 06/23/24 1832 06/23/24 1920 06/23/24 2011  BP: (!) 105/58 123/62 (!) 105/53 (!) 117/53  Pulse: 83 86 84 91  Resp: 18 18 17 17   Temp: 98.2 F (36.8 C) 98.1 F (36.7 C) 98.2 F (36.8 C) 98.2 F (36.8 C)  TempSrc: Oral Oral Oral   SpO2: 100% 97% 99% 99%  Weight:      Height:        Intake/Output Summary (Last 24 hours) at 06/23/2024 2045 Last  data filed at 06/23/2024 1753 Gross per 24 hour  Intake 1290 ml  Output --  Net 1290 ml   Filed Weights   06/19/24 1050  Weight: 64.3 kg   Examination: Physical Exam:  Constitutional: Thin chronically ill-appearing elderly Caucasian female who is lethargic and fatigued appearing Respiratory: Diminished to auscultation bilaterally with some coarse breath sounds, no wheezing, rales, rhonchi or crackles. Normal respiratory effort and patient is not tachypenic. No accessory muscle use.  Unlabored breathing Cardiovascular: RRR, no murmurs / rubs / gallops. S1 and S2 auscultated.  1+ lower extremity edema Abdomen: Soft, mildly-tender, nondistended.  Bowel sounds positive.  GU: Deferred. Musculoskeletal: No clubbing / cyanosis of digits/nails. No joint deformity upper  and lower extremities.  Skin: No rashes, lesions, ulcers on a limited skin evaluation. No induration; Warm and dry.  Neurologic: CN 2-12 grossly intact with no focal deficits. Romberg sign and cerebellar reflexes not assessed.  Psychiatric: Normal judgment and insight. Alert and oriented x 3. Normal mood and appropriate affect.   Data Reviewed: I have personally reviewed following labs and imaging studies  CBC: Recent Labs  Lab 06/19/24 0903 06/20/24 1028 06/21/24 0147 06/22/24 0223 06/23/24 0241 06/23/24 1817  WBC 4.3 3.4* 3.7* 6.3 4.4  --   NEUTROABS 3.2 2.4 3.0 5.1 3.3  --   HGB 8.4* 7.0* 7.2* 7.1* 6.7* 11.7*  HCT 25.0* 21.9* 22.5* 22.2* 20.9* 35.0*  MCV 94.0 99.1 98.7 98.7 98.6  --   PLT 92* 76* 70* 77* 64*  --    Basic Metabolic Panel: Recent Labs  Lab 06/19/24 0903 06/20/24 1028 06/21/24 0147 06/22/24 0223 06/23/24 0241  NA 136 137 136 135 138  K 3.9 3.8 4.3 4.0 3.3*  CL 101 104 103 102 103  CO2 24 24 25 24 24   GLUCOSE 95 132* 137* 143* 89  BUN 16 15 14 15 19   CREATININE 1.04* 1.02* 1.00 1.16* 1.24*  CALCIUM 7.9* 7.5* 7.8* 7.8* 8.1*  MG  --  2.1 2.1 2.0 2.1  PHOS  --  3.5 3.4 3.2 3.0   GFR: Estimated Creatinine Clearance: 35.9 mL/min (A) (by C-G formula based on SCr of 1.24 mg/dL (H)). Liver Function Tests: Recent Labs  Lab 06/19/24 0903 06/20/24 1028 06/21/24 0147 06/22/24 0223 06/23/24 0241  AST 241* 217* 225* 226* 246*  ALT 37 33 35 35 37  ALKPHOS 398* 371* 418* 379* 360*  BILITOT 0.9 0.7 0.7 0.6 0.7  PROT 5.5* 4.6* 4.8* 4.9* 4.9*  ALBUMIN  2.8* 2.4* 2.4* 2.5* 2.8*   No results for input(s): LIPASE, AMYLASE in the last 168 hours. No results for input(s): AMMONIA in the last 168 hours. Coagulation Profile: No results for input(s): INR, PROTIME in the last 168 hours. Cardiac Enzymes: No results for input(s): CKTOTAL, CKMB, CKMBINDEX, TROPONINI in the last 168 hours. BNP (last 3 results) No results for input(s): PROBNP in the  last 8760 hours. HbA1C: No results for input(s): HGBA1C in the last 72 hours. CBG: Recent Labs  Lab 06/23/24 2022  GLUCAP 105*   Lipid Profile: No results for input(s): CHOL, HDL, LDLCALC, TRIG, CHOLHDL, LDLDIRECT in the last 72 hours. Thyroid  Function Tests: No results for input(s): TSH, T4TOTAL, FREET4, T3FREE, THYROIDAB in the last 72 hours. Anemia Panel: No results for input(s): VITAMINB12, FOLATE, FERRITIN, TIBC, IRON, RETICCTPCT in the last 72 hours. Sepsis Labs: No results for input(s): PROCALCITON, LATICACIDVEN in the last 168 hours.  No results found for this or any previous visit (from the past 240 hours).  Radiology Studies: No results found.  Scheduled Meds:  Chlorhexidine  Gluconate Cloth  6 each Topical Daily   dronabinol   5 mg Oral BID AC   famotidine   40 mg Oral QHS   feeding supplement  237 mL Oral TID BM   OLANZapine   5 mg Oral QHS   oxyCODONE   15 mg Oral Q12H   pantoprazole   40 mg Oral BID AC   pramipexole   0.5 mg Oral TID   sodium chloride  flush  10-40 mL Intracatheter Q12H   sucralfate   1 g Oral TID WC & HS   Continuous Infusions:    LOS: 1 day   Alejandro Marker, DO [facility] Available via Epic secure chat 7am-7pm After these hours, please refer to coverage provider listed on [email or web address] 06/23/2024, 8:45 PM

## 2024-06-23 NOTE — Progress Notes (Addendum)
° °      Overnight   NAME: Doris Smith MRN: [id number] DOB : 12-18-1950    Date of Service   06/23/2024   HPI/Events of Note    Notified by RN for change of reported story from earlier fall and Scheduled CT-Head. CT now STAT due to possible pupil derangement.  73 year old female history significant for paroxysmal A-fib, or aortic arthrosclerosis, anxiety, depression, bipolar disorder, asthma, fatty liver disease, fibromyalgia, mild glaucoma, gastric erosion, GERD, irritable bowel syndrome, sleep apnea not on CPAP, restless leg syndrome, tardive dyskinesia, vitamin B12 deficiency, stage IV breast cancer.  Admitted through ER for decreased oral intake back pain nausea and failure to thrive.  Patient had an unwitnessed fall earlier today.  Currently: pupil slow, cataracts, known glaucoma,  alert and oriented, waiting for CT, vitals are stable   Imaging in part:  FINDINGS:   BRAIN AND VENTRICLES: No acute intracranial hemorrhage. No mass effect or midline shift. No extra-axial fluid collection. No evidence of acute infarct. No hydrocephalus. Stable mild cerebral white matter hypodensities and mild cerebral atrophy compared to prior study.   ORBITS: No acute abnormality.   SINUSES AND MASTOIDS: No acute abnormality.   SOFT TISSUES AND SKULL: No acute skull fracture. No acute soft tissue abnormality. Unchanged diffuse sclerosis of skull base bones.   IMPRESSION: 1. No acute intracranial abnormality. 2. Mild chronic microvascular ischemic disease, stable.   Electronically signed by: Morene Hoard MD 06/23/2024 09:09 PM EST RP Workstation: [id number]       Interventions/ Plan   Continue Attending orders  Fall precautions Fall Arresting pads  Bed alarm      Lynwood Kipper BSN MSNA MSN ACNPC-AG Acute Care Nurse Practitioner [facility]

## 2024-06-23 NOTE — Progress Notes (Signed)
 Initial Nutrition Assessment  DOCUMENTATION CODES:   Severe malnutrition in context of chronic illness  INTERVENTION:  - Regular diet.  - Provided patient with Nausea and Vomiting and High-Calorie, High-Protein Diet handouts - Ensure Plus High Protein po TID, each supplement provides 350 kcal and 20 grams of protein. - Encourage intake at all meals and of supplements.  - Continue Marinol  to support appetite. - Monitor weight trends.   NUTRITION DIAGNOSIS:   Severe Malnutrition related to chronic illness (stage IV breast cancer) as evidenced by moderate fat depletion, severe muscle depletion, percent weight loss (12.7% in 4 months).  GOAL:   Patient will meet greater than or equal to 90% of their needs  MONITOR:   PO intake, Supplement acceptance, Weight trends  REASON FOR ASSESSMENT:   Consult Assessment of nutrition requirement/status, Poor PO  ASSESSMENT:   73 y.o. female with PMH significant of paroxysmal A-fib, anxiety, depression, bipolar disorder, asthma, fatty liver disease, fibromyalgia, gastric erosion, GERD, IBS, vitamin B12 deficiency, stage IV breast cancer who presented with back pain, nausea, decreased oral intake and overall failure to thrive.   Patient in bed at time of visit, husband at bedside.   UBW reported to be close to 240# around 1-2 years ago and husband reports the patient has been losing weight since that time.  Per EMR, patient has not weighed as much as 240# since 2023. Patient's weight appears stable from January to July. In July, patient was weighed at 158# and she has since continued to lose to lowest weight of 138# in November. This is a 20# or 12.7% weight loss in 4 months, which is significant for the time frame.  Patient reports that she had been eating fairly at home. Appetite has been decreased and she has also been dealing with frequent nausea. Was doing Boost Plus at home as tolerated.  Since admission, patient reports she is now  eating better than she has in several weeks. Documented to be consuming 50-100% of meals. She is agreeable to receive Ensure TID to support intake.   Provided patient with Nausea and Vomiting and High-Calorie, High-Protein Diet handouts from the [employer] and reviewed with husband. He was appreciative of information.  Patient was previously followed by the the outpatient cancer center dietitians but has not been seen since 2024. Will reach out to them.    Medications reviewed and include: Marinol , Protonix , Carafate   Labs reviewed:  K+ 3.3 Creatinine 1.24  NUTRITION - FOCUSED PHYSICAL EXAM:  Flowsheet Row Most Recent Value  Orbital Region Severe depletion  Upper Arm Region Moderate depletion  Thoracic and Lumbar Region Unable to assess  Buccal Region Moderate depletion  Temple Region Severe depletion  Clavicle Bone Region Severe depletion  Clavicle and Acromion Bone Region Severe depletion  Scapular Bone Region Unable to assess  Dorsal Hand Severe depletion  Patellar Region Unable to assess  [edema]  Anterior Thigh Region Unable to assess  [edema]  Posterior Calf Region Unable to assess  [edema]  Edema (RD Assessment) Mild  Hair Reviewed  Eyes Reviewed  Mouth Reviewed  Skin Reviewed  Nails Reviewed    Diet Order:   Diet Order             Diet regular Fluid consistency: Thin  Diet effective now                   EDUCATION NEEDS:  Education needs have been addressed  Skin:  Skin Assessment: Reviewed RN Assessment  Last BM:  12/12  Height:  Ht Readings from Last 1 Encounters:  06/19/24 5' 4 (1.626 m)   Weight:  Wt Readings from Last 1 Encounters:  06/19/24 64.3 kg   BMI:  Body mass index is 24.32 kg/m.  Estimated Nutritional Needs:  Kcal:  1950-2100 kcals Protein:  85-100 grams Fluid:  >/= 2L    Trude Ned RD, LDN Contact via Secure Chat.

## 2024-06-23 NOTE — Plan of Care (Signed)

## 2024-06-24 ENCOUNTER — Inpatient Hospital Stay (HOSPITAL_COMMUNITY)

## 2024-06-24 ENCOUNTER — Encounter: Payer: Self-pay | Admitting: Hematology and Oncology

## 2024-06-24 DIAGNOSIS — C801 Malignant (primary) neoplasm, unspecified: Secondary | ICD-10-CM | POA: Diagnosis not present

## 2024-06-24 DIAGNOSIS — C7951 Secondary malignant neoplasm of bone: Secondary | ICD-10-CM | POA: Diagnosis not present

## 2024-06-24 DIAGNOSIS — R918 Other nonspecific abnormal finding of lung field: Secondary | ICD-10-CM | POA: Diagnosis not present

## 2024-06-24 DIAGNOSIS — J90 Pleural effusion, not elsewhere classified: Secondary | ICD-10-CM | POA: Diagnosis not present

## 2024-06-24 DIAGNOSIS — I6782 Cerebral ischemia: Secondary | ICD-10-CM | POA: Diagnosis not present

## 2024-06-24 DIAGNOSIS — R4182 Altered mental status, unspecified: Secondary | ICD-10-CM | POA: Diagnosis not present

## 2024-06-24 LAB — AMMONIA: Ammonia: 70 umol/L — ABNORMAL HIGH (ref 9–35)

## 2024-06-24 LAB — BLOOD GAS, VENOUS
Acid-Base Excess: 1.3 mmol/L (ref 0.0–2.0)
Bicarbonate: 25 mmol/L (ref 20.0–28.0)
O2 Saturation: 79.4 %
Patient temperature: 37
pCO2, Ven: 36 mmHg — ABNORMAL LOW (ref 44–60)
pH, Ven: 7.45 — ABNORMAL HIGH (ref 7.25–7.43)
pO2, Ven: 44 mmHg (ref 32–45)

## 2024-06-24 LAB — COMPREHENSIVE METABOLIC PANEL WITH GFR
ALT: 47 U/L — ABNORMAL HIGH (ref 0–44)
AST: 374 U/L — ABNORMAL HIGH (ref 15–41)
Albumin: 3.1 g/dL — ABNORMAL LOW (ref 3.5–5.0)
Alkaline Phosphatase: 435 U/L — ABNORMAL HIGH (ref 38–126)
Anion gap: 12 (ref 5–15)
BUN: 22 mg/dL (ref 8–23)
CO2: 25 mmol/L (ref 22–32)
Calcium: 8 mg/dL — ABNORMAL LOW (ref 8.9–10.3)
Chloride: 102 mmol/L (ref 98–111)
Creatinine, Ser: 1.27 mg/dL — ABNORMAL HIGH (ref 0.44–1.00)
GFR, Estimated: 45 mL/min — ABNORMAL LOW (ref >60)
Glucose, Bld: 156 mg/dL — ABNORMAL HIGH (ref 70–99)
Potassium: 3.1 mmol/L — ABNORMAL LOW (ref 3.5–5.1)
Sodium: 138 mmol/L (ref 135–145)
Total Bilirubin: 1.3 mg/dL — ABNORMAL HIGH (ref 0.0–1.2)
Total Protein: 5.2 g/dL — ABNORMAL LOW (ref 6.5–8.1)

## 2024-06-24 LAB — CBC WITH DIFFERENTIAL/PLATELET
Abs Immature Granulocytes: 0.05 K/uL (ref 0.00–0.07)
Basophils Absolute: 0 K/uL (ref 0.0–0.1)
Basophils Relative: 1 %
Eosinophils Absolute: 0 K/uL (ref 0.0–0.5)
Eosinophils Relative: 1 %
HCT: 30.9 % — ABNORMAL LOW (ref 36.0–46.0)
Hemoglobin: 10.4 g/dL — ABNORMAL LOW (ref 12.0–15.0)
Immature Granulocytes: 1 %
Lymphocytes Relative: 7 %
Lymphs Abs: 0.3 K/uL — ABNORMAL LOW (ref 0.7–4.0)
MCH: 30.6 pg (ref 26.0–34.0)
MCHC: 33.7 g/dL (ref 30.0–36.0)
MCV: 90.9 fL (ref 80.0–100.0)
Monocytes Absolute: 0.7 K/uL (ref 0.1–1.0)
Monocytes Relative: 17 %
Neutro Abs: 3 K/uL (ref 1.7–7.7)
Neutrophils Relative %: 73 %
Platelets: 48 K/uL — ABNORMAL LOW (ref 150–400)
RBC: 3.4 MIL/uL — ABNORMAL LOW (ref 3.87–5.11)
RDW: 19.1 % — ABNORMAL HIGH (ref 11.5–15.5)
Smear Review: NORMAL
WBC: 4.1 K/uL (ref 4.0–10.5)
nRBC: 1.7 % — ABNORMAL HIGH (ref 0.0–0.2)

## 2024-06-24 LAB — URINALYSIS, ROUTINE W REFLEX MICROSCOPIC
Bilirubin Urine: NEGATIVE
Glucose, UA: NEGATIVE mg/dL
Hgb urine dipstick: NEGATIVE
Ketones, ur: NEGATIVE mg/dL
Leukocytes,Ua: NEGATIVE
Nitrite: NEGATIVE
Protein, ur: NEGATIVE mg/dL
Specific Gravity, Urine: 1.017 (ref 1.005–1.030)
pH: 5 (ref 5.0–8.0)

## 2024-06-24 LAB — VITAMIN B12: Vitamin B-12: 2762 pg/mL — ABNORMAL HIGH (ref 180–914)

## 2024-06-24 LAB — MRSA NEXT GEN BY PCR, NASAL: MRSA by PCR Next Gen: NOT DETECTED

## 2024-06-24 LAB — MAGNESIUM: Magnesium: 2 mg/dL (ref 1.7–2.4)

## 2024-06-24 LAB — T4, FREE: Free T4: <0.25 ng/dL — ABNORMAL LOW (ref 0.80–2.00)

## 2024-06-24 LAB — HIV ANTIBODY (ROUTINE TESTING W REFLEX): HIV Screen 4th Generation wRfx: NONREACTIVE

## 2024-06-24 LAB — TSH: TSH: 183 u[IU]/mL — ABNORMAL HIGH (ref 0.350–4.500)

## 2024-06-24 LAB — PHOSPHORUS: Phosphorus: 3 mg/dL (ref 2.5–4.6)

## 2024-06-24 MED ORDER — ORAL CARE MOUTH RINSE: 15.0000 mL | OROMUCOSAL | Status: DC | PRN

## 2024-06-24 MED ORDER — FUROSEMIDE 10 MG/ML IJ SOLN
20.0000 mg | Freq: Once | INTRAMUSCULAR | Status: AC
  Administered 2024-06-24: 21:00:00 20 mg via INTRAVENOUS
  Filled 2024-06-24: qty 2

## 2024-06-24 MED ORDER — ACETAMINOPHEN 500 MG PO TABS: 500.0000 mg | ORAL_TABLET | Freq: Four times a day (QID) | ORAL | Status: DC | PRN

## 2024-06-24 MED ORDER — LACTULOSE 10 GM/15ML PO SOLN
20.0000 g | Freq: Two times a day (BID) | ORAL | Status: DC
  Administered 2024-06-25 – 2024-06-26 (×3): 20 g via ORAL
  Filled 2024-06-24 (×4): qty 30

## 2024-06-24 MED ORDER — PIPERACILLIN-TAZOBACTAM 3.375 G IVPB
3.3750 g | Freq: Three times a day (TID) | INTRAVENOUS | Status: DC
  Administered 2024-06-24 – 2024-06-25 (×2): 3.375 g via INTRAVENOUS
  Filled 2024-06-24 (×2): qty 50

## 2024-06-24 MED ORDER — VANCOMYCIN HCL 1250 MG/250ML IV SOLN
1250.0000 mg | Freq: Once | INTRAVENOUS | Status: AC
  Administered 2024-06-24: 22:00:00 1250 mg via INTRAVENOUS
  Filled 2024-06-24: qty 250

## 2024-06-24 MED ORDER — POTASSIUM CHLORIDE 10 MEQ/50ML IV SOLN
10.0000 meq | INTRAVENOUS | Status: AC
  Administered 2024-06-24 – 2024-06-25 (×3): 10 meq via INTRAVENOUS
  Filled 2024-06-24 (×3): qty 50

## 2024-06-24 MED ORDER — SODIUM CHLORIDE 0.9 % IV SOLN
40.0000 mg | INTRAVENOUS | Status: DC
  Filled 2024-06-24: qty 4

## 2024-06-24 MED ORDER — ORAL CARE MOUTH RINSE
15.0000 mL | OROMUCOSAL | Status: DC
  Administered 2024-06-25 – 2024-06-26 (×4): 15 mL via OROMUCOSAL

## 2024-06-24 MED ORDER — LEVOTHYROXINE SODIUM 100 MCG/5ML IV SOLN
20.0000 ug | Freq: Every day | INTRAVENOUS | Status: DC
  Administered 2024-06-24 – 2024-06-25 (×2): 20 ug via INTRAVENOUS
  Filled 2024-06-24 (×2): qty 5

## 2024-06-24 MED ORDER — VANCOMYCIN HCL 1250 MG/250ML IV SOLN: 1250.0000 mg | INTRAVENOUS | Status: DC

## 2024-06-24 MED ORDER — ACETAMINOPHEN 650 MG RE SUPP: 325.0000 mg | Freq: Four times a day (QID) | RECTAL | Status: DC | PRN

## 2024-06-24 MED ORDER — LORAZEPAM 2 MG/ML IJ SOLN: 1.0000 mg | Freq: Two times a day (BID) | INTRAMUSCULAR | Status: DC | PRN

## 2024-06-24 MED ORDER — POTASSIUM CHLORIDE CRYS ER 20 MEQ PO TBCR
40.0000 meq | EXTENDED_RELEASE_TABLET | Freq: Two times a day (BID) | ORAL | Status: AC
  Administered 2024-06-25: 09:00:00 40 meq via ORAL
  Filled 2024-06-24 (×2): qty 2

## 2024-06-24 MED ORDER — POTASSIUM CHLORIDE CRYS ER 20 MEQ PO TBCR
40.0000 meq | EXTENDED_RELEASE_TABLET | Freq: Two times a day (BID) | ORAL | Status: DC
  Administered 2024-06-24: 10:00:00 40 meq via ORAL
  Filled 2024-06-24 (×2): qty 2

## 2024-06-24 NOTE — Progress Notes (Signed)
° °      Overnight   NAME: Greysen Swanton MRN: [id number] DOB : Nov 26, 1950    Date of Service   06/24/2024   HPI/Events of Note    Notified by RN for some blurriness to her left eye, more than usual but not too much this is with the patient wearing her glasses.   Attending is addressing on Rounds this AM.  Per discussion Attending will be ordering MRI   Interventions/ Plan   Continue Attending orders       Lynwood Kipper BSN MSNA MSN ACNPC-AG Acute Care Nurse Practitioner [facility]

## 2024-06-24 NOTE — Progress Notes (Signed)
 PROGRESS NOTE    Doris Smith  FMW:[id number] DOB: Jun 01, 1951 DOA: 06/19/2024 PCP: [name], FNP   Brief Narrative:  Doris Smith is a 73 y.o. female with medical history significant of paroxysmal atrial fibrillation, aortic atherosclerosis, anxiety, depression, bipolar disorder, asthma, fatty liver disease, fibromyalgia, glaucoma, gastric erosion, GERD, irritable bowel syndrome, sleep apnea not on CPAP, restless leg syndrome, tardive dyskinesia, vitamin B12 deficiency, stage IV breast cancer who presented to the emergency department with back pain, nausea, decreased oral intake and overall failure to thrive.  Oncology repeating CT Scans and going to change Chemotherapy in the outpatient setting.   Throughout the hospitalization patient has been lethargic.  Her hemoglobin dropped so she was typed and screened and transfused 2 units PRBCs.  Overnight she had a unwitnessed fall and head CT was done and showed no acute intracranial abnormalities.  Neurochecks were performed every few hours and subsequently she had a sluggish left pupil which started reacting more throughout the day however the patient became more somnolent and drowsy throughout the day..  Subsequently the nurse relayed that it stopped reactive and was dilated to 5 mm's.  Stat MRI was ordered and is pending to be done.   Patient was transferred to the stepdown unit with concern for possible myxedema coma given her elevated TSH of 183.0 so she was initiated on IV levothyroxine .  further infectious and neurological workup is being obtained and EEG has been ordered. She also had a temperature so we will obtain blood cultures and urinalysis and urine culture and start her on empiric antibiotics.  Ammonia level was also elevated so we will give her some lactulose .  Patient continues to be somnolent and drowsy and may need neurological involvement  Palliative care consulted for further goals of care discussion and  awaiting to see the patient.  Assessment and Plan:  Failure to Thrive in Adult: C/w Supportive care w/ Analgesics and Antiemetics as needed. Obtain PT/OT evaluation and PT recommending Home Health for now  -Obtain Nutritional Services consult and see below. C/w Ensure High Plus; Add Marinol  2.5 mg po BID and will increased to 5 mg po BID - IV fluid has been stopped given her volume overload and received IV Albumin  and Furosemide  yesterday and will dose give IV Lasix  today given her chest x-ray findings.  She is status post 2 units of pRBCs given her significant anemia. -W/U as below. Will need Palliative Care GOC Discussion given the events of the last 24 hours and they have been consulted  Unwitnessed Fall: Happened  overnight 12/15-12/16. Nursing notified that patient slid out of the chair to the floor and was found to be sitting on the floor with back against bed frame. Per nursing Denied hitting head. Husband notified nursing after albumin  was given. Since not witnessed fall obtained CT Head w/o Contrast which showed No acute intracranial abnormality and Mild chronic microvascular ischemic disease, stable. Only noted to have skin tear on patient's back. VSS per nurse and no other issues noted last evening. Neurochecks q2h. Subsequently she started becoming more lethargic throughout the day and was very somnolent and drowsy so we will pursue further workup infectious and neurological workup as below  Somnolence and Drowsiness: Extremely somnolent and drowsy this AM and more lethargic but got worse throughout the day.  Checked an ammonia level and it was 70 so we will start her on lactulose  20 g twice daily.  Vitamin B12 level was 2762.  Also check TSH which was 183.000.  And RPR and HIV was negative.  Will transfer to the stepdown unit and see below.  Check for infectious etiologies.  MRI being checked as below and ordered an EEG and it was to be done but the EEG computer crashed and will need to be  repaired before performing the EEG.  Given her drowsiness her p.o. intake is limited so we will change everything we can from p.o. to IV  Discordant pupil/Anisocoria: Had a unwitnessed fall yesterday and left pupil was more sluggish after her fall but she had no visual deficits except some slight blurred vision which is normal for her.  Her started reacting more throughout the day but patient has a history of cataracts.  Subsequently the nurse notified late this evening that it was more dilated and stayed around 5 mm.  Ordering a stat head MRI.  Unfortunately told by the MRI department at [facility] long that could not be done given that they are closing for the evening so the patient will need to go to [facility] for this  Hypothyroidism with concern for Myxedema Coma: Continued to be more drowsy throughout the day and TSH was 183.000.  Check free T4 and T3 levels which are pending.  Initiate IV levothyroxine  for now  Fever: Spiked a temperature of 100.5 this evening.  Check blood cultures x 2, urinalysis with urine culture.  Initiate IV antibiotics with IV Zosyn  and IV vancomycin .  Chest x-ray was done and showed Worsening aeration to both lower lobes, possibly reflecting atelectasis and/or consolidative change but also showed bilateral Bilateral interstitial opacities, likely pulmonary edema and Moderate bilateral pleural effusions.  She was given IV Lasix  20 mg as above.  Follow infectious markers.  WBC is not elevated but she is immunocompromised with her history of metastatic cancer.  Nausea and Vomiting: Initiate Antiemetics w/ Prochlorperazine  10 mg IV q6hprn N/V  Bipolar Disorder (HCC) / Anxiety associated with depression: Continue Olanzapine  5 mg p.o. bedtime and Continue Lorazepam  1 mg p.o. twice daily.   Essential Hypertension: Monitor blood pressure per Protocol. Last BP reading was 105/58   Paroxysmal Atrial Fibrillation [facility]): Normal rate and rhythm.  Continue to monitor on telemetry carefully  given that she is in the stepdown unit now   Aortic Atherosclerosis: Will defer statin given advanced malignancy and given Liver Metastasis and Abnormal LFTs   Malignant Neoplasm of upper-outer quadrant of right breast in female, estrogen receptor positive (HCC): Initially Continued Orserdu  345 mg p.o. daily but due to concern for progression the Oncology team is stopping this and since her last Bx was Her 2 2+  the Onc team is going to proceed with IV Enhertu q21 Days after Christmas (12/30) and arranging for Simultaneous Bx.  -Oncology consulted and since patient's tumor markers have doubled (CA 15-3 is 1,544.0 and CA 27.29 is 4,452.5) there is concern for disease progression so the oncology team is ordering a CT contrasted scans (CT Chest/Abd/Pelvis) with prophylaxis. This showed Status post right mastectomy. Innumerable hepatic metastases, grossly unchanged. Diffuse sclerotic osseous metastases, unchanged. Large bilateral pleural effusions, mildly progressive. Moderate abdominopelvic ascites, progressive. - Palliative care consulted for further goals of care discussion  AKI on CKD Stage II: Baseline Cr ~0.6-0.9. BUN/Cr Trend slightly worsening (22/1.27) but she has worsening swelling so IVF stopped. Had Dose of Albumin  and IV Furosemide  given her ascites; She got blood yesterday so given another dose of Albumin  and IV Furosemide . Will give a dose IV Furosemide  today.  -Avoid Nephrotoxic Medications, Contrast Dyes, Hypotension and  Dehydration to Ensure Adequate Renal Perfusion and will need to Renally Adjust Meds. CTM & Trend Renal Function carefully & repeat CMP in the AM   Abnormal LFTs: In the setting of Above. AST and ALT Trend:  Recent Labs  Lab 06/19/24 0903 06/20/24 1028 06/20/24 1028 06/21/24 0147 06/22/24 0223 06/23/24 0241 06/24/24 0415  AST 241* 217*  --  225* 226* 246* 374*  ALT 37 33   < > 35 35 37 47*   < > = values in this interval not displayed.  -CTM & Trend & repeat CMP  in the AM and if necessary will obtain a RUQ U/S and an Acute Hepatitis Panel but will hold off given that she has known Liver Metastasis.    Restless Leg Syndrome: On pramipexole  0.5 mg 3 times daily.  Hypokalemia: K+ was 3.1. Replete. CTM and Replete as Necessary.    Chronic back pain: Continue OxyContin  15 mg p.o. twice daily and po Oxycodone  5 mg po q6hprn Breakthrough Pain however will need to be held given her somnolence and drowsiness   Constipation: Continue MiraLAX  17 grams every day as needed.  Pancytopenia: CBC Trend:  Recent Labs  Lab 06/19/24 0903 06/19/24 0903 06/20/24 1028 06/21/24 0147 06/22/24 0223 06/23/24 0241 06/23/24 1817 06/24/24 0415  WBC 4.3  --  3.4* 3.7* 6.3 4.4  --  4.1  HGB 8.4*   < > 7.0* 7.2* 7.1* 6.7*   < > 10.4*  HCT 25.0*  --  21.9* 22.5* 22.2* 20.9*   < > 30.9*  MCV 94.0  --  99.1 98.7 98.7 98.6  --  90.9  PLT 92*   < > 76* 70* 77* 64*  --  48*   < > = values in this interval not displayed.  -CTM for S/Sx of Bleeding; Hgb low on 12/15 AM so will type and screen and transfuse 2 units of pRBCs. Check FOBT. Transfuse for Hgb < 7.0. CTM For S/Sx of Bleeding; No overt bleeding noted. Repeat CBC in the AM  Hypoalbuminemia: Patient's Albumin  Lvl is now 3.1. CTM & Trend & repeat CMP in the AM  GERD (gastroesophageal reflux disease)/GI Prophylaxis: Continue PPI w/ Pantoprazole  40 mg po BID and this will need to be changed to IV given her somnolence and drowsiness; continue Sucralfate  1 gram po TIDwm and HS.  Severe Malnutrition in the Context of Chronic illness: Nutrition Status: Nutrition Problem: Severe Malnutrition Etiology: chronic illness (stage IV breast cancer) Signs/Symptoms: moderate fat depletion, severe muscle depletion, percent weight loss (12.7% in 4 months) Percent weight loss: 12.7 % (in 4 months) Interventions: Refer to RD note for recommendations, Ensure Enlive (each supplement provides 350kcal and 20 grams of protein) -Appetite has  been extremely poor. Will obtain a Calorie Count   DVT prophylaxis: Place and maintain sequential compression device Start: 06/23/24 1056    Code Status: Limited: Do not attempt resuscitation (DNR) -DNR-LIMITED -Do Not Intubate/DNI  Family Communication: D/w Husband @ bedside   Disposition Plan:  Level of care: Stepdown Status is: Inpatient Remains inpatient appropriate because: Clinically started worsening today and was more somnolent and drowsy.  See workup as above.  She is not medically stable for discharge at this time   Consultants:  Medical Oncology Palliative care medicine  Procedures:  As delineated as above  Antimicrobials:  Anti-infectives (From admission, onward)    Start     Dose/Rate Route Frequency Ordered Stop   06/26/24 1000  vancomycin  (VANCOREADY) IVPB 1250 mg/250 mL  1,250 mg 166.7 mL/hr over 90 Minutes Intravenous Every 36 hours 06/24/24 2135     06/24/24 2130  piperacillin -tazobactam (ZOSYN ) IVPB 3.375 g        3.375 g 12.5 mL/hr over 240 Minutes Intravenous Every 8 hours 06/24/24 2034     06/24/24 2130  vancomycin  (VANCOREADY) IVPB 1250 mg/250 mL        1,250 mg 166.7 mL/hr over 90 Minutes Intravenous  Once 06/24/24 2034         Subjective: Seen and examined at bedside was extremely somnolent and drowsy but arousable.  Has not been eating very well and still remains nauseous intermittently.  Continued to be somnolent and became more drowsy throughout the day.  She did complain of a little bit of blurred vision this morning but states it was just mildly worse than normal.  Left pupil was sluggish but reactive but then became dilated prompting stat workup.  She continues to not feel well and appetite is very poor.  She was subsequently transferred to the stepdown unit  Objective: Vitals:   06/24/24 1800 06/24/24 1947 06/24/24 2010 06/24/24 2100  BP: (!) 102/57 101/60 (!) 109/55 (!) 123/52  Pulse: 87 92 93 94  Resp: 16 20 15 20   Temp: 98.2 F  (36.8 C) (!) 100.5 F (38.1 C) 98.9 F (37.2 C)   TempSrc: Axillary  Oral   SpO2: 98% 98% 100% 99%  Weight:   68.3 kg   Height:   5' 4 (1.626 m)     Intake/Output Summary (Last 24 hours) at 06/24/2024 2250 Last data filed at 06/24/2024 0400 Gross per 24 hour  Intake 240 ml  Output --  Net 240 ml   Filed Weights   06/19/24 1050 06/24/24 2010  Weight: 64.3 kg 68.3 kg   Examination: Physical Exam:  Constitutional: Thin chronically ill-appearing Caucasian female who appears extremely somnolent and drowsy.  Awakes to physical verbal stimuli Respiratory: Diminished to auscultation bilaterally with some coarse breath sounds and has some slight rhonchi and some slight crackles noted.  No appreciable wheezing or rales.  Has a normal respiratory effort and she is not tachypneic Cardiovascular: RRR, no murmurs / rubs / gallops. S1 and S2 auscultated.  1+ extremity edema Abdomen: Soft, non-tender, slightly distended. Bowel sounds positive.  GU: Deferred. Musculoskeletal: No clubbing / cyanosis of digits/nails. No joint deformity upper and lower extremities.  Skin: No appreciable rashes on limited skin evaluation Neurologic: Extremely somnolent and drowsy but arouses. Left pupil is a little sluggish compared to the right but does react. Psychiatric: Appears very sleepy and is calm  Data Reviewed: I have personally reviewed following labs and imaging studies  CBC: Recent Labs  Lab 06/20/24 1028 06/21/24 0147 06/22/24 0223 06/23/24 0241 06/23/24 1817 06/24/24 0415  WBC 3.4* 3.7* 6.3 4.4  --  4.1  NEUTROABS 2.4 3.0 5.1 3.3  --  3.0  HGB 7.0* 7.2* 7.1* 6.7* 11.7* 10.4*  HCT 21.9* 22.5* 22.2* 20.9* 35.0* 30.9*  MCV 99.1 98.7 98.7 98.6  --  90.9  PLT 76* 70* 77* 64*  --  48*   Basic Metabolic Panel: Recent Labs  Lab 06/20/24 1028 06/21/24 0147 06/22/24 0223 06/23/24 0241 06/24/24 0415  NA 137 136 135 138 138  K 3.8 4.3 4.0 3.3* 3.1*  CL 104 103 102 103 102  CO2 24 25 24  24 25   GLUCOSE 132* 137* 143* 89 156*  BUN 15 14 15 19 22   CREATININE 1.02* 1.00 1.16* 1.24* 1.27*  CALCIUM 7.5* 7.8* 7.8* 8.1* 8.0*  MG 2.1 2.1 2.0 2.1 2.0  PHOS 3.5 3.4 3.2 3.0 3.0   GFR: Estimated Creatinine Clearance: 38.5 mL/min (A) (by C-G formula based on SCr of 1.27 mg/dL (H)). Liver Function Tests: Recent Labs  Lab 06/20/24 1028 06/21/24 0147 06/22/24 0223 06/23/24 0241 06/24/24 0415  AST 217* 225* 226* 246* 374*  ALT 33 35 35 37 47*  ALKPHOS 371* 418* 379* 360* 435*  BILITOT 0.7 0.7 0.6 0.7 1.3*  PROT 4.6* 4.8* 4.9* 4.9* 5.2*  ALBUMIN  2.4* 2.4* 2.5* 2.8* 3.1*   No results for input(s): LIPASE, AMYLASE in the last 168 hours. Recent Labs  Lab 06/24/24 1259  AMMONIA 70*   Coagulation Profile: No results for input(s): INR, PROTIME in the last 168 hours. Cardiac Enzymes: No results for input(s): CKTOTAL, CKMB, CKMBINDEX, TROPONINI in the last 168 hours. BNP (last 3 results) No results for input(s): PROBNP in the last 8760 hours. HbA1C: No results for input(s): HGBA1C in the last 72 hours. CBG: Recent Labs  Lab 06/23/24 2022  GLUCAP 105*   Lipid Profile: No results for input(s): CHOL, HDL, LDLCALC, TRIG, CHOLHDL, LDLDIRECT in the last 72 hours. Thyroid  Function Tests: Recent Labs    06/24/24 1259  TSH 183.000*   Anemia Panel: Recent Labs    06/24/24 1259  VITAMINB12 2,762*   Sepsis Labs: No results for input(s): PROCALCITON, LATICACIDVEN in the last 168 hours.  No results found for this or any previous visit (from the past 240 hours).   Radiology Studies: DG CHEST PORT 1 VIEW Result Date: 06/24/2024 EXAM: 1 VIEW(S) XRAY OF THE CHEST 06/24/2024 05:29:00 AM COMPARISON: 05/28/2024 CLINICAL HISTORY: SOB (shortness of breath) FINDINGS: LINES, TUBES AND DEVICES: Right chest port in place with tip projecting over right atrium. LUNGS AND PLEURA: Moderate bilateral pleural effusions. Worsening aeration to both lower  lobes, which may reflect atelectasis and/or consolidative change. Bilateral interstitial opacities, likely pulmonary edema. No pneumothorax. HEART AND MEDIASTINUM: Stable cardiomegaly. BONES AND SOFT TISSUES: Diffuse sclerotic metastatic disease of osseous structures. Old healed left lower rib fracture. IMPRESSION: 1. Worsening aeration to both lower lobes, possibly reflecting atelectasis and/or consolidative change. 2. Bilateral interstitial opacities, likely pulmonary edema. 3. Moderate bilateral pleural effusions. Electronically signed by: Waddell Calk MD 06/24/2024 07:21 AM EST RP Workstation: [id number]   CT HEAD WO CONTRAST ( ) Result Date: 06/23/2024 EXAM: CT HEAD WITHOUT 06/23/2024 08:53:00 PM TECHNIQUE: CT of the head was performed without the administration of intravenous contrast. Automated exposure control, iterative reconstruction, and/or weight based adjustment of the mA/kV was utilized to reduce the radiation dose to as low as reasonably achievable. COMPARISON: 04/28/2024 CLINICAL HISTORY: Unwitnessed Fall FINDINGS: BRAIN AND VENTRICLES: No acute intracranial hemorrhage. No mass effect or midline shift. No extra-axial fluid collection. No evidence of acute infarct. No hydrocephalus. Stable mild cerebral white matter hypodensities and mild cerebral atrophy compared to prior study. ORBITS: No acute abnormality. SINUSES AND MASTOIDS: No acute abnormality. SOFT TISSUES AND SKULL: No acute skull fracture. No acute soft tissue abnormality. Unchanged diffuse sclerosis of skull base bones. IMPRESSION: 1. No acute intracranial abnormality. 2. Mild chronic microvascular ischemic disease, stable. Electronically signed by: Morene Hoard MD 06/23/2024 09:09 PM EST RP Workstation: [id number]     Scheduled Meds:  Chlorhexidine  Gluconate Cloth  6 each Topical Daily   dronabinol   5 mg Oral BID AC   feeding supplement  237 mL Oral TID BM   lactulose   20 g Oral BID   levothyroxine   20  mcg  Intravenous Daily   OLANZapine   5 mg Oral QHS   [START ON 06/25/2024] mouth rinse  15 mL Mouth Rinse 4 times per day   oxyCODONE   15 mg Oral Q12H   pantoprazole   40 mg Oral BID AC   potassium chloride   40 mEq Oral BID   pramipexole   0.5 mg Oral TID   sodium chloride  flush  10-40 mL Intracatheter Q12H   sucralfate   1 g Oral TID WC & HS   Continuous Infusions:  [START ON 06/25/2024] famotidine  (PEPCID ) IV     piperacillin -tazobactam (ZOSYN )  IV 3.375 g (06/24/24 2105)   potassium chloride      vancomycin  1,250 mg (06/24/24 2138)   [START ON 06/26/2024] vancomycin       LOS: 2 days   Patient is critically ill with multiple organ system failure and requires high complexity decision making for assessment and support, frequent evaluation and titration of therapies and application of advanced monitoring technologies and extensive interpretation of multiple databases  CRITICAL CARE TIME: 57 nonconsecutive minutes devoted to patient care services described in this note including but not limited to reviewing the chart, seeing the patient, coordinating care, updating the patient's husband and discussing with the appropriate consultants if necessary  Alejandro Marker, DO [facility] Available via Epic secure chat 7am-7pm After these hours, please refer to coverage provider listed on [email or web address] 06/24/2024, 10:50 PM

## 2024-06-24 NOTE — Progress Notes (Signed)
 Patient states she hit the back of her head when she fell. Her left pupil is larger than the right pupil and not reacting to light. Patient says her head hurts where she hit it.   RN notified Lynwood Kipper, NP of the above information.   Orders Received: STAT CT, ICU RN to assess patient

## 2024-06-24 NOTE — Progress Notes (Signed)
 Patient is more alert this morning she is complaining of some blurriness to her left eye, more than usual but not too much this is with the patient wearing her glasses.   RN notified Lynwood Kipper, NP of the above information.   No new orders at this time

## 2024-06-24 NOTE — Progress Notes (Signed)
 RN and NS transported the patient to room 1225. Patient safety maintained during transport.

## 2024-06-24 NOTE — Consult Note (Signed)
 WOC Nurse Consult Note: Reason for Consult: coccyx wound  Wound type: 1.  Deep Tissue Pressure Injury Coccyx purple maroon discoloration ? Lifting of epidermis  2.  Stage 2 Pressure Injury R buttock red moist  Pressure Injury POA: no (admitted 12/11, consulted 12/15)  Measurement: see nursing flowsheet  Wound bed:as above  Drainage (amount, consistency, odor) see nursing flowsheet  Periwound: erythema  Dressing procedure/placement/frequency: Cleanse coccyx and R buttock wounds with soap and water, dry. Apply Xeroform gauze ([place] [phone]) to open wound and purple discoloration daily and secure with silicone foam.    POC discussed with bedside nurse.  WOC team will follow every 7-10 days to assess area and change POC as needed.  Please reconsult if acute changes to wound bed.   Thank you,    Powell Bar MSN, RN-BC, [employer]

## 2024-06-24 NOTE — Plan of Care (Signed)

## 2024-06-24 NOTE — Progress Notes (Signed)
 Pharmacy Antibiotic Note  Doris Smith is a 73 y.o. female admitted on 06/19/2024 with sepsis.  Pharmacy has been consulted for vancomycin  dosing.  Plan: Vancomycin  1250 mg IV q36h (Goal AUC 400-550, eAUC 505.5, SCr used; 1.27) Monitor clinical progress, renal function, vancomycin  levels as indicated F/U C&S, abx deescalation / LOT   Height: 5' 4 (162.6 cm) Weight: 68.3 kg (150 lb 9.2 oz) IBW/kg (Calculated) : 54.7  Temp (24hrs), Avg:98.5 F (36.9 C), Min:97.7 F (36.5 C), Max:100.5 F (38.1 C)  Recent Labs  Lab 06/20/24 1028 06/21/24 0147 06/22/24 0223 06/23/24 0241 06/24/24 0415  WBC 3.4* 3.7* 6.3 4.4 4.1  CREATININE 1.02* 1.00 1.16* 1.24* 1.27*    Estimated Creatinine Clearance: 38.5 mL/min (A) (by C-G formula based on SCr of 1.27 mg/dL (H)).    Allergies[1]    Thank you for allowing pharmacy to be a part of this patients care.  Eleanor Agent, PharmD, BCPS 06/24/2024 9:37 PM     [1]  Allergies Allergen Reactions   Iohexol      over 20 years ago had reaction hurting in arm and heart-Done in [place], [place].SABRAphysician stopped CT at that time.   Codeine Other (See Comments)    Makes feel like she is wild    Palonosetron  Other (See Comments)    headache   Prednisone  Other (See Comments)    Makes me very irritable   Sulfonamide Derivatives Other (See Comments)    Upset stomach   Celecoxib Nausea And Vomiting   Sulfa Antibiotics Nausea And Vomiting   Vitamin B12 Rash

## 2024-06-24 NOTE — Plan of Care (Signed)
°  Problem: Clinical Measurements: Goal: Respiratory complications will improve Outcome: Progressing Goal: Cardiovascular complication will be avoided Outcome: Progressing   Problem: Activity: Goal: Risk for activity intolerance will decrease Outcome: Progressing   Problem: Elimination: Goal: Will not experience complications related to bowel motility Outcome: Progressing Goal: Will not experience complications related to urinary retention Outcome: Progressing   Problem: Pain Managment: Goal: General experience of comfort will improve and/or be controlled Outcome: Progressing   Problem: Safety: Goal: Ability to remain free from injury will improve Outcome: Progressing   Problem: Skin Integrity: Goal: Risk for impaired skin integrity will decrease Outcome: Progressing

## 2024-06-24 NOTE — TOC Progression Note (Addendum)
 Transition of Care [facility]) - Progression Note    Patient Details  Name: Doris Smith MRN: [id number] Date of Birth: 04-Oct-1950  Transition of Care [facility]) CM/SW Contact  Alfonse JONELLE Rex, RN Phone Number: 06/24/2024, 3:31 PM  Clinical Narrative:   Met with patient and spouse at bedside, presented [facility] offers ( [facility]. [facility], [facility]), left list with spouse to review choice. NCM will follow up for [facility] agency choice.  Request sent to attending to enter [facility] PT/OT order.     Expected Discharge Plan: Home/Self Care Barriers to Discharge: Continued Medical Work up               Expected Discharge Plan and Services In-house Referral: NA Discharge Planning Services: CM Consult Post Acute Care Choice: NA Living arrangements for the past 2 months: Single Family Home                 DME Arranged: N/A DME Agency: NA       HH Arranged: NA HH Agency: NA         Social Drivers of Health (SDOH) Interventions SDOH Screenings   Food Insecurity: No Food Insecurity (06/19/2024)  Housing: Low Risk (06/19/2024)  Transportation Needs: No Transportation Needs (06/19/2024)  Utilities: Not At Risk (06/19/2024)  Alcohol  Screen: Low Risk (08/18/2022)  Depression (PHQ2-9): High Risk (05/16/2024)  Financial Resource Strain: Patient Declined (10/22/2023)  Physical Activity: Unknown (10/22/2023)  Social Connections: Moderately Isolated (06/19/2024)  Stress: Stress Concern Present (10/22/2023)  Tobacco Use: Low Risk (06/19/2024)    Readmission Risk Interventions     No data to display

## 2024-06-24 NOTE — Progress Notes (Signed)
 EEG attempted. Unfortunately the computer crashed and will need repaired before performing the EEG.

## 2024-06-24 NOTE — Progress Notes (Signed)
 Physical Therapy Treatment Patient Details Name: Doris Smith MRN: [id number] DOB: 1951/06/19 Today's Date: 06/24/2024   History of Present Illness 73 yo female admitted with FTT, back pain, nausea. Hx of met breast Ca, afib, anxiety/depression, fibromyalgia, TKA, fatty liver disease, bipolar d/o, IBS, RLS, glaucoma, tardive dyskinesia    PT Comments  Pt agreeable to working with therapy-she stated she wanted to get OOB-bed alarm and purewick on since fall on 12/15. Pt was very lethargic during brief session. She stated she feels like she may have an infection. She had difficulty remaining alert during session. She had difficulty following 1 step commands. Husband arrived towards end of session with her meal. He had to assist with prepping food-she had difficult using utensils. Recommended to husband that he exercise caution while feeling pt when lethargic. Discussed d/c-husband stated that he needs pt to be able to ambulate for him to be able to care for her at home. Recommendation is currently for HHPT-may need to update recs if pt is not showing improvement on next session.     If plan is discharge home, recommend the following: A lot of help with walking and/or transfers;A lot of help with bathing/dressing/bathroom;Assistance with cooking/housework;Assistance with feeding;Assist for transportation;Help with stairs or ramp for entrance   Can travel by private vehicle        Equipment Recommendations  None recommended by PT    Recommendations for Other Services       Precautions / Restrictions Precautions Precautions: Fall Precaution/Restrictions Comments: pt had fall on 12/15 Restrictions Weight Bearing Restrictions Per Provider Order: No     Mobility  Bed Mobility               General bed mobility comments: Attempted x 2-pt barely able to move LEs in bed. She had dificulty keeping her eyes open during session. She would drift off to sleep mid-task. She had difficulty  following 1 step commands.  Deferred further mobility for safety reasons/+2 not available. High fall risk.    Transfers                   General transfer comment: N for safety reasons    Ambulation/Gait                   Stairs             Wheelchair Mobility     Tilt Bed    Modified Rankin (Stroke Patients Only)       Balance                                            Communication Communication Communication: No apparent difficulties  Cognition Arousal: Lethargic Behavior During Therapy: Flat affect   PT - Cognitive impairments: Problem solving, Sequencing                         Following commands: Impaired Following commands impaired: Follows one step commands inconsistently    Cueing Cueing Techniques: Verbal cues, Tactile cues  Exercises      General Comments        Pertinent Vitals/Pain Pain Assessment Pain Assessment: Faces Faces Pain Scale: Hurts little more Pain Location: generalized Pain Descriptors / Indicators: Discomfort, Sore Pain Intervention(s): Limited activity within patient's tolerance, Monitored during session, Repositioned    Home Living  Prior Function            PT Goals (current goals can now be found in the care plan section) Progress towards PT goals: Not progressing toward goals - comment (2* lethargy)    Frequency    Min 3X/week      PT Plan      Co-evaluation              AM-PAC PT 6 Clicks Mobility   Outcome Measure  Help needed turning from your back to your side while in a flat bed without using bedrails?: A Lot Help needed moving from lying on your back to sitting on the side of a flat bed without using bedrails?: A Lot Help needed moving to and from a bed to a chair (including a wheelchair)?: Total Help needed standing up from a chair using your arms (e.g., wheelchair or bedside chair)?: Total Help needed to  walk in hospital room?: Total Help needed climbing 3-5 steps with a railing? : Total 6 Click Score: 8    End of Session   Activity Tolerance: Patient limited by lethargy Patient left: in bed;with call bell/phone within reach;with bed alarm set;with family/visitor present   PT Visit Diagnosis: Pain;Muscle weakness (generalized) (M62.81);Difficulty in walking, not elsewhere classified (R26.2)     Time: 8540-8480 PT Time Calculation (min) (ACUTE ONLY): 20 min  Charges:    $Therapeutic Activity: 8-22 mins PT General Charges $$ ACUTE PT VISIT: 1 Visit                         Dannial SQUIBB, PT Acute Rehabilitation  Office: [phone]

## 2024-06-25 ENCOUNTER — Inpatient Hospital Stay (HOSPITAL_COMMUNITY)

## 2024-06-25 DIAGNOSIS — C799 Secondary malignant neoplasm of unspecified site: Secondary | ICD-10-CM

## 2024-06-25 DIAGNOSIS — C50919 Malignant neoplasm of unspecified site of unspecified female breast: Secondary | ICD-10-CM

## 2024-06-25 DIAGNOSIS — R4589 Other symptoms and signs involving emotional state: Secondary | ICD-10-CM | POA: Diagnosis not present

## 2024-06-25 DIAGNOSIS — C50411 Malignant neoplasm of upper-outer quadrant of right female breast: Secondary | ICD-10-CM | POA: Diagnosis not present

## 2024-06-25 DIAGNOSIS — R569 Unspecified convulsions: Secondary | ICD-10-CM | POA: Diagnosis not present

## 2024-06-25 DIAGNOSIS — Z515 Encounter for palliative care: Secondary | ICD-10-CM

## 2024-06-25 DIAGNOSIS — R4182 Altered mental status, unspecified: Secondary | ICD-10-CM

## 2024-06-25 DIAGNOSIS — Z170 Estrogen receptor positive status [ER+]: Secondary | ICD-10-CM | POA: Diagnosis not present

## 2024-06-25 DIAGNOSIS — R7989 Other specified abnormal findings of blood chemistry: Secondary | ICD-10-CM

## 2024-06-25 DIAGNOSIS — Z66 Do not resuscitate: Secondary | ICD-10-CM

## 2024-06-25 DIAGNOSIS — R627 Adult failure to thrive: Secondary | ICD-10-CM | POA: Diagnosis not present

## 2024-06-25 DIAGNOSIS — E43 Unspecified severe protein-calorie malnutrition: Secondary | ICD-10-CM | POA: Diagnosis not present

## 2024-06-25 DIAGNOSIS — Z7189 Other specified counseling: Secondary | ICD-10-CM

## 2024-06-25 LAB — CBC
HCT: 31.2 % — ABNORMAL LOW (ref 36.0–46.0)
Hemoglobin: 10.4 g/dL — ABNORMAL LOW (ref 12.0–15.0)
MCH: 30.2 pg (ref 26.0–34.0)
MCHC: 33.3 g/dL (ref 30.0–36.0)
MCV: 90.7 fL (ref 80.0–100.0)
Platelets: 43 K/uL — ABNORMAL LOW (ref 150–400)
RBC: 3.44 MIL/uL — ABNORMAL LOW (ref 3.87–5.11)
RDW: 19 % — ABNORMAL HIGH (ref 11.5–15.5)
WBC: 5.5 K/uL (ref 4.0–10.5)
nRBC: 0.9 % — ABNORMAL HIGH (ref 0.0–0.2)

## 2024-06-25 LAB — GLUCOSE, CAPILLARY: Glucose-Capillary: 124 mg/dL — ABNORMAL HIGH (ref 70–99)

## 2024-06-25 LAB — COMPREHENSIVE METABOLIC PANEL WITH GFR
ALT: 60 U/L — ABNORMAL HIGH (ref 0–44)
AST: 419 U/L — ABNORMAL HIGH (ref 15–41)
Albumin: 2.7 g/dL — ABNORMAL LOW (ref 3.5–5.0)
Alkaline Phosphatase: 482 U/L — ABNORMAL HIGH (ref 38–126)
Anion gap: 11 (ref 5–15)
BUN: 22 mg/dL (ref 8–23)
CO2: 24 mmol/L (ref 22–32)
Calcium: 7.6 mg/dL — ABNORMAL LOW (ref 8.9–10.3)
Chloride: 103 mmol/L (ref 98–111)
Creatinine, Ser: 1.04 mg/dL — ABNORMAL HIGH (ref 0.44–1.00)
GFR, Estimated: 57 mL/min — ABNORMAL LOW (ref >60)
Glucose, Bld: 89 mg/dL (ref 70–99)
Potassium: 4.3 mmol/L (ref 3.5–5.1)
Sodium: 139 mmol/L (ref 135–145)
Total Bilirubin: 1.8 mg/dL — ABNORMAL HIGH (ref 0.0–1.2)
Total Protein: 4.9 g/dL — ABNORMAL LOW (ref 6.5–8.1)

## 2024-06-25 LAB — MAGNESIUM: Magnesium: 1.9 mg/dL (ref 1.7–2.4)

## 2024-06-25 LAB — PHOSPHORUS: Phosphorus: 3.4 mg/dL (ref 2.5–4.6)

## 2024-06-25 LAB — T3: T3, Total: 36 ng/dL — ABNORMAL LOW (ref 71–180)

## 2024-06-25 LAB — SYPHILIS: RPR W/REFLEX TO RPR TITER AND TREPONEMAL ANTIBODIES, TRADITIONAL SCREENING AND DIAGNOSIS ALGORITHM: RPR Ser Ql: NONREACTIVE

## 2024-06-25 MED ORDER — ALBUMIN HUMAN 5 % IV SOLN
12.5000 g | Freq: Once | INTRAVENOUS | Status: AC
  Administered 2024-06-25: 06:00:00 12.5 g via INTRAVENOUS
  Filled 2024-06-25: qty 250

## 2024-06-25 MED ORDER — SODIUM CHLORIDE 0.9 % IV BOLUS
250.0000 mL | Freq: Once | INTRAVENOUS | Status: AC
  Administered 2024-06-25: 13:00:00 250 mL via INTRAVENOUS

## 2024-06-25 MED ORDER — MIDODRINE HCL 5 MG PO TABS
5.0000 mg | ORAL_TABLET | Freq: Three times a day (TID) | ORAL | Status: DC
  Administered 2024-06-26: 09:00:00 5 mg via ORAL
  Filled 2024-06-25: qty 1

## 2024-06-25 MED ORDER — SODIUM CHLORIDE 0.9 % IV BOLUS
250.0000 mL | Freq: Once | INTRAVENOUS | Status: AC
  Administered 2024-06-25: 04:00:00 250 mL via INTRAVENOUS

## 2024-06-25 MED ORDER — LEVOTHYROXINE SODIUM 100 MCG/5ML IV SOLN
50.0000 ug | Freq: Every day | INTRAVENOUS | Status: DC
  Administered 2024-06-26: 09:00:00 50 ug via INTRAVENOUS
  Filled 2024-06-25: qty 5

## 2024-06-25 MED ORDER — SODIUM CHLORIDE 0.9 % IV SOLN
2.0000 g | INTRAVENOUS | Status: DC
  Administered 2024-06-25: 15:00:00 2 g via INTRAVENOUS
  Filled 2024-06-25: qty 20

## 2024-06-25 MED ORDER — POTASSIUM CHLORIDE 10 MEQ/50ML IV SOLN
10.0000 meq | Freq: Once | INTRAVENOUS | Status: AC
  Administered 2024-06-25: 04:00:00 10 meq via INTRAVENOUS
  Filled 2024-06-25: qty 50

## 2024-06-25 MED ORDER — LEVOTHYROXINE SODIUM 100 MCG/5ML IV SOLN
80.0000 ug | Freq: Every day | INTRAVENOUS | Status: AC
  Administered 2024-06-25: 13:00:00 80 ug via INTRAVENOUS
  Filled 2024-06-25: qty 5

## 2024-06-25 MED ORDER — SODIUM CHLORIDE 0.9 % IV BOLUS
250.0000 mL | Freq: Once | INTRAVENOUS | Status: AC
  Administered 2024-06-25: 20:00:00 250 mL via INTRAVENOUS

## 2024-06-25 NOTE — Progress Notes (Signed)
 Calorie Count Note  48 hour calorie count ordered.  Diet: Regular Supplements: Ensure Plus High Protein TID  Subjective: Patient being followed by RD, last seen 12/15. Patient sleepy during visit but husband reports she had a good breakfast. She is documented to have had 25% of a CYO omelet, and 100% of a turkey sausage, oatmeal, a chocolate ice cream, and milk. This is already 498 kcals and 16g of protein consumed so far today. Patient agreeable to try Magic Cup in addition to Ensure. Husband to encourage intake at meals and of supplements.  Intervention:  - 48 hour calorie count to start today and run through 12/18.  - Please document % intake of all foods, drinks, and nutrition supplements patient consumes on meal tickets and place in envelope on patient's door.  - If patient skips/refuses meals please document 0% for that meal.  - Discussed with RN.  - Regular diet.  - Ensure Plus High Protein po TID, each supplement provides 350 kcal and 20 grams of protein. - Magic cup TID with meals, each supplement provides 290 kcal and 9 grams of protein - Continue Marinol  to stimulate appetite.   Trude Ned RD, LDN Contact via [employer].

## 2024-06-25 NOTE — Progress Notes (Signed)
 Routine EEG complete. Results pending.

## 2024-06-25 NOTE — Progress Notes (Signed)
 Occupational Therapy Treatment Patient Details Name: Doris Smith MRN: [id number] DOB: December 30, 1950 Today's Date: 06/25/2024   History of present illness 73 yo female admitted with FTT, back pain, nausea. Hx of met breast Ca, afib, anxiety/depression, fibromyalgia, TKA, fatty liver disease, bipolar d/o, IBS, RLS, glaucoma, tardive dyskinesia   OT comments  Patient seen in order to progress with functional mobility and activity tolerance. Patient with significant difference in comparison to OT eval on 12/14, now requiring mod to max A to transition to EOB, and up to mod A to maintain sitting balance. Patient lethargic, with reduced clarity of speech and now max A for ADL management. Unable to progress into standing due to no assist present and increased lethargy. OT updating recommendation to lesser intensive facility < 3 hours prior to discharge home. OT will continue to follow acutely.   BP in supine: 102/35 (53) BP in supine after ankle pumps: 100/51 (66) BP sitting EOB 108/54 (67) BP sitting EOB after 5 minutes 112/43 (67)       If plan is discharge home, recommend the following:  Two people to help with walking and/or transfers;Two people to help with bathing/dressing/bathroom;Assistance with cooking/housework;Assistance with feeding;Direct supervision/assist for medications management;Direct supervision/assist for financial management;Assist for transportation;Help with stairs or ramp for entrance;Supervision due to cognitive status   Equipment Recommendations  Other (comment) (defer to next venue)    Recommendations for Other Services      Precautions / Restrictions Precautions Precautions: Fall Precaution/Restrictions Comments: pt had fall on 12/15 Restrictions Weight Bearing Restrictions Per Provider Order: No       Mobility Bed Mobility Overal bed mobility: Needs Assistance Bed Mobility: Supine to Sit, Sit to Supine     Supine to sit: Max assist Sit to supine:  Max assist   General bed mobility comments: Patient able to participate minimally, requiring assist for BLEs, use of bed pad to transition to EOB, and up to mod A to maintain sitting balance, unable to maintain balance for longer than 15 seconds    Transfers                   General transfer comment: deferred due to safety     Balance Overall balance assessment: Needs assistance Sitting-balance support: Bilateral upper extremity supported, Feet unsupported Sitting balance-Leahy Scale: Poor Sitting balance - Comments: posterior lean                                   ADL either performed or assessed with clinical judgement   ADL Overall ADL's : Needs assistance/impaired     Grooming: Set up;Bed level               Lower Body Dressing: Total assistance;Sitting/lateral leans;Sit to/from stand               Functional mobility during ADLs: Maximal assistance;+2 for safety/equipment;+2 for physical assistance;Cueing for safety;Cueing for sequencing;Rolling walker (2 wheels) General ADL Comments: Patient seen in order to progress with functional mobility and activity tolerance. Patient with significant difference in comparison to OT eval on 12/14, now requiring mod to max A to transition to EOB, and up to mod A to maintain sitting balance. Patient lethargic, with reduced clarity of speech and now max A for ADL management. Unable to progress into standing due to no assist present and increased lethargy. OT updating recommendation to lesser intensive facility < 3 hours prior to discharge  home. OT will continue to follow acutely.    Extremity/Trunk Assessment              Occupational Psychologist Communication: Impaired Factors Affecting Communication: Reduced clarity of speech   Cognition Arousal: Lethargic Behavior During Therapy: Flat affect Cognition: Cognition impaired   Orientation  impairments: Situation Awareness: Online awareness impaired Memory impairment (select all impairments): Short-term memory, Working memory Attention impairment (select first level of impairment): Sustained attention Executive functioning impairment (select all impairments): Organization, Sequencing, Problem solving OT - Cognition Comments: lethargic throughout, required max cues in order to maintain alert level, would follow one step command with increased time                 Following commands: Impaired Following commands impaired: Follows one step commands inconsistently      Cueing   Cueing Techniques: Verbal cues, Tactile cues  Exercises      Shoulder Instructions       General Comments      Pertinent Vitals/ Pain       Pain Assessment Pain Assessment: Faces Faces Pain Scale: Hurts little more Pain Location: generalized Pain Descriptors / Indicators: Discomfort, Sore, Guarding, Grimacing Pain Intervention(s): Limited activity within patient's tolerance, Monitored during session, Repositioned  Home Living                                          Prior Functioning/Environment              Frequency  Min 2X/week        Progress Toward Goals  OT Goals(current goals can now be found in the care plan section)  Progress towards OT goals: Progressing toward goals (minimally)  Acute Rehab OT Goals Patient Stated Goal: per husband: get better OT Goal Formulation: With family Time For Goal Achievement: 07/06/24 Potential to Achieve Goals: Poor  Plan      Co-evaluation                 AM-PAC OT 6 Clicks Daily Activity     Outcome Measure   Help from another person eating meals?: A Lot Help from another person taking care of personal grooming?: A Lot Help from another person toileting, which includes using toliet, bedpan, or urinal?: A Lot Help from another person bathing (including washing, rinsing, drying)?: A Lot Help  from another person to put on and taking off regular upper body clothing?: A Lot Help from another person to put on and taking off regular lower body clothing?: Total 6 Click Score: 11    End of Session    OT Visit Diagnosis: Unsteadiness on feet (R26.81);Muscle weakness (generalized) (M62.81);Other symptoms and signs involving cognitive function;Pain   Activity Tolerance Patient limited by fatigue;Patient limited by lethargy   Patient Left in bed;with call bell/phone within reach;with bed alarm set;with family/visitor present   Nurse Communication Mobility status        Time: 9044-8980 OT Time Calculation (min): 24 min  Charges: OT General Charges $OT Visit: 1 Visit OT Treatments $Self Care/Home Management : 23-37 mins  Ronal Gift E. Tripton Ned, OTR/L Acute Rehabilitation Services [phone]   Ronal Gift Salt 06/25/2024, 3:31 PM

## 2024-06-25 NOTE — Consult Note (Signed)
 Consultation Note Date: 06/25/2024   Patient Name: Doris Smith  DOB: [birth year]  MRN: [id number]  Age / Sex: 73 y.o., female   PCP: [name], FNP Referring Physician: Sherrill Alejandro Donovan, DO  Reason for Consultation: Establishing goals of care     Chief Complaint/History of Present Illness:   Patient is a 73 year old female with a past medical history of paroxysmal atrial fibrillation, aortic atherosclerosis, anxiety, depression, bipolar disorder, asthma, fatty liver disease, fibromyalgia, glaucoma, gastric erosion, GERD, irritable bowel syndrome, sleep apnea not on CPAP, restless leg syndrome, tardive dyskinesia, vitamin B12 deficiency, and stage IV breast cancer with metastatic disease to bone, liver, and malignant pleural effusion who was admitted on 06/19/2024 for management of back pain, nausea, decreased oral intake, and overall failure to thrive.  Since being admitted, patient has continued receive management for poor oral intake/failure to thrive, hypothyroidism with concerns for myxedema coma, and liver dysfunction.  Palliative medicine team consulted to assist with complex medical decision making. Of note, patient follows with outpatient PMT at [facility].   Reviewed EMR including recent documentation from hospitalist, nutritionist, and PT/OT.  Also reviewed outpatient oncology documentation from 06/19/2024.  Patient had been receiving third line cancer directed therapies.  Patient's functional status had been deteriorating in the outpatient setting and was noted that if PPS is deteriorating, would appropriately not be a candidate for further cancer directed therapies.  Reviewed recent CMP noting patient's liver enzymes continuing to trend upwards with AST 419, ALT 60, and total bilirubin 1.8.  Patient's albumin  also low at 2.7.  CBC reviewed and also showed patient's platelets low at 43.  Reviewed recent chest x-ray noting worsening aeration concerning for atelectasis versus  consolidative change, bilateral interstitial opacities likely pulmonary edema, and moderate bilateral pleural effusions. Discussed care with bedside RN and nutritionist for medical updates.  Presented to bedside to see patient.  Patient's husband, Doris Smith, present at bedside.  Patient laying in bed during conversation.  Introduced myself as a member of the palliative medicine team.  Patient was very lethargic during conversation and only awakened during short intervals with slowed speech when was awake.  Able to discuss care with patient's husband at bedside.  Spent time learning about patient's medical journey up into this point.  Also discussed current medical concerns including though not limited to patient's worsening liver function, mentation, and lack of oral intake.  Patient did have improvement of oral intake this morning for breakfast so noted hope this would continue.  Spent time discussing that based on patient's current functional status, would not be on appropriate candidate for further cancer directed therapies.  Husband acknowledged that they had discussions with oncologist previously who noted that patient probably had about 2 years left in her life and he noted that it is coming to the end of those 2 years.  Husband appropriately tearful during conversation.  Spent time providing emotional support reactive listening.  Noted at this time team is continuing appropriate medical interventions with hope patient can improve.  Also expressed concern that patient's medical status may continue to worsen and then would need to determine what kind of care she would want to receive.  Patient is already appropriately DNR/DNI.  Spent time answering questions as able.  Noted palliative medicine team will continue to follow along with patient's medical journey.  Discussed care with bedside RN after visit to provide updates.  Primary Diagnoses  Present on Admission:  Failure to thrive in adult  Essential  hypertension  Aortic atherosclerosis  Anxiety associated with depression  Paroxysmal atrial fibrillation (HCC)  Constipation  Chronic back pain  Bipolar disorder (HCC)  GERD (gastroesophageal reflux disease)  Restless leg syndrome    Past Medical History:  Diagnosis Date   A-fib (HCC) 11/2012   PAF   Anxiety    takes Ativan    Asthma 04/07/2011   dx   Bipolar disorder (HCC)    Cancer (HCC)    Right breast   Depression    Early cataracts, bilateral    Fatty liver    Fibromyalgia    GERD (gastroesophageal reflux disease)    Irritable bowel syndrome    Mental disorder    dx bipolar   PONV (postoperative nausea and vomiting)    Restless leg syndrome 10/03/2021   Sleep apnea 04/2011   does not wear CPAP   Tardive dyskinesia    Vitamin B12 deficiency 05/16/2016   Social History   Socioeconomic History   Marital status: Married    Spouse name: Doris Smith   Number of children: 2   Years of education: 12   Highest education level: Not on file  Occupational History   Occupation: Disabled  Tobacco Use   Smoking status: Never   Smokeless tobacco: Never  Vaping Use   Vaping status: Never Used  Substance and Sexual Activity   Alcohol  use: No   Drug use: No   Sexual activity: Yes    Birth control/protection: Post-menopausal, Surgical  Other Topics Concern   Not on file  Social History Narrative   Tea daily.  Rarely has caffeine    Social Drivers of Health   Tobacco Use: Low Risk (06/19/2024)   Patient History    Smoking Tobacco Use: Never    Smokeless Tobacco Use: Never    Passive Exposure: Not on file  Financial Resource Strain: Patient Declined (10/22/2023)   Overall Financial Resource Strain (CARDIA)    Difficulty of Paying Living Expenses: Patient declined  Food Insecurity: No Food Insecurity (06/19/2024)   Epic    Worried About Programme Researcher, Broadcasting/film/video in the Last Year: Never true    Ran Out of Food in the Last Year: Never true  Transportation Needs: No  Transportation Needs (06/19/2024)   Epic    Lack of Transportation (Medical): No    Lack of Transportation (Non-Medical): No  Physical Activity: Unknown (10/22/2023)   Exercise Vital Sign    Days of Exercise per Week: 0 days    Minutes of Exercise per Session: Not on file  Stress: Stress Concern Present (10/22/2023)   [employer] of Occupational Health - Occupational Stress Questionnaire    Feeling of Stress : To some extent  Social Connections: Moderately Isolated (06/19/2024)   Social Connection and Isolation Panel    Frequency of Communication with Friends and Family: More than three times a week    Frequency of Social Gatherings with Friends and Family: More than three times a week    Attends Religious Services: Patient declined    Active Member of Clubs or Organizations: No    Attends Banker Meetings: Never    Marital Status: Married  Depression (PHQ2-9): High Risk (05/16/2024)   Depression (PHQ2-9)    PHQ-2 Score: 13  Alcohol  Screen: Low Risk (08/18/2022)   Alcohol  Screen    Last Alcohol  Screening Score (AUDIT): 0  Housing: Low Risk (06/19/2024)   Epic    Unable to Pay for Housing in the Last Year: No    Number of Times Moved in the Last  Year: 0    Homeless in the Last Year: No  Utilities: Not At Risk (06/19/2024)   Epic    Threatened with loss of utilities: No  Health Literacy: Not on file   Family History  Problem Relation Age of Onset   Anesthesia problems Mother    Heart disease Mother        CHF, atrial fib   Heart failure Mother    Anesthesia problems Sister    Colon polyps Father    Heart disease Father        Fluid around the heart   Hypertension Sister    Breast cancer Maternal Aunt    Colon cancer Maternal Aunt    Prostate cancer Neg Hx    Ovarian cancer Neg Hx    Scheduled Meds:  Chlorhexidine  Gluconate Cloth  6 each Topical Daily   dronabinol   5 mg Oral BID AC   feeding supplement  237 mL Oral TID BM   lactulose   20 g Oral BID    levothyroxine   20 mcg Intravenous Daily   OLANZapine   5 mg Oral QHS   mouth rinse  15 mL Mouth Rinse 4 times per day   oxyCODONE   15 mg Oral Q12H   pantoprazole   40 mg Oral BID AC   potassium chloride   40 mEq Oral BID   pramipexole   0.5 mg Oral TID   sodium chloride  flush  10-40 mL Intracatheter Q12H   sucralfate   1 g Oral TID WC & HS   Continuous Infusions:  famotidine  (PEPCID ) IV     piperacillin -tazobactam (ZOSYN )  IV 3.375 g (06/25/24 0600)   [START ON 06/26/2024] vancomycin      PRN Meds:.acetaminophen  **OR** acetaminophen , LORazepam , mouth rinse, oxyCODONE , polyethylene glycol, prochlorperazine , sodium chloride  flush Allergies[1] CBC:    Component Value Date/Time   WBC 5.5 06/25/2024 0328   HGB 10.4 (L) 06/25/2024 0328   HGB 8.4 (L) 06/19/2024 0903   HGB 12.0 06/05/2022 1024   HCT 31.2 (L) 06/25/2024 0328   HCT 37.4 06/05/2022 1024   PLT 43 (L) 06/25/2024 0328   PLT 92 (L) 06/19/2024 0903   PLT 251 06/05/2022 1024   MCV 90.7 06/25/2024 0328   MCV 86 06/05/2022 1024   NEUTROABS 3.0 06/24/2024 0415   NEUTROABS 5.3 06/05/2022 1024   LYMPHSABS 0.3 (L) 06/24/2024 0415   LYMPHSABS 0.9 06/05/2022 1024   MONOABS 0.7 06/24/2024 0415   EOSABS 0.0 06/24/2024 0415   EOSABS 0.1 06/05/2022 1024   BASOSABS 0.0 06/24/2024 0415   BASOSABS 0.1 06/05/2022 1024   Comprehensive Metabolic Panel:    Component Value Date/Time   NA 139 06/25/2024 0328   NA 145 (H) 06/05/2022 1024   K 4.3 06/25/2024 0328   CL 103 06/25/2024 0328   CO2 24 06/25/2024 0328   BUN 22 06/25/2024 0328   BUN 17 06/05/2022 1024   CREATININE 1.04 (H) 06/25/2024 0328   CREATININE 1.04 (H) 06/19/2024 0903   GLUCOSE 89 06/25/2024 0328   CALCIUM 7.6 (L) 06/25/2024 0328   AST 419 (H) 06/25/2024 0328   AST 241 (HH) 06/19/2024 0903   ALT 60 (H) 06/25/2024 0328   ALT 37 06/19/2024 0903   ALKPHOS 482 (H) 06/25/2024 0328   BILITOT 1.8 (H) 06/25/2024 0328   BILITOT 0.9 06/19/2024 0903   PROT 4.9 (L) 06/25/2024  0328   PROT 6.6 06/05/2022 1024   ALBUMIN  2.7 (L) 06/25/2024 0328   ALBUMIN  4.0 06/05/2022 1024    Physical Exam: Vital Signs: BP ROLLEN)  104/43 (BP Location: Right Arm)   Pulse 76   Temp 98.1 F (36.7 C) (Axillary)   Resp 17   Ht 5' 4 (1.626 m)   Wt 68.3 kg   SpO2 98%   BMI 25.85 kg/m  SpO2: SpO2: 98 % O2 Device: O2 Device: Room Air O2 Flow Rate: O2 Flow Rate (L/min): 2 L/min Intake/output summary: No intake or output data in the 24 hours ending 06/25/24 0657 LBM: Last BM Date : 06/22/24 Baseline Weight: Weight: 64.3 kg Most recent weight: Weight: 68.3 kg  General: NAD, lethargic, ill-appearing, frail Cardiovascular: RRR Respiratory: no increased work of breathing noted, not in respiratory distress Abdomen: not distended Neuro: Lethargic         Palliative Performance Scale: 20%              Additional Data Reviewed: Recent Labs    06/24/24 0415 06/25/24 0328  WBC 4.1 5.5  HGB 10.4* 10.4*  PLT 48* 43*  NA 138 139  BUN 22 22  CREATININE 1.27* 1.04*    Imaging: MR BRAIN WO CONTRAST EXAM: MRI BRAIN WITHOUT CONTRAST 06/24/2024 11:23:32 PM  TECHNIQUE: Multiplanar multisequence MRI of the head/brain was performed without the administration of intravenous contrast.  COMPARISON: Prior CT from 06/23/2024.  CLINICAL HISTORY: Mental status change, unknown cause; Somnolence.  FINDINGS:  BRAIN AND VENTRICLES: No acute infarct. No intracranial hemorrhage. No mass. No midline shift. No hydrocephalus. Mild T2/FLAIR signal abnormality involving the periventricular white matter, most characteristic of chronic microvascular ischemic disease, mild for age. The sella is unremarkable. Normal flow voids.  ORBITS: No acute abnormality.  SINUSES AND MASTOIDS: Mild scattered mucosal thickening is present about the sphenoid with mild sinus involvement. No significant mastoid effusion.  BONES AND SOFT TISSUES: Diffusely abnormal appearance of the visualized bone  marrow, consistent with known widespread osseous metastatic disease. No acute soft tissue abnormality.  IMPRESSION: 1. No acute intracranial abnormality. 2. Diffusely abnormal appearance of the visualized bone marrow, consistent with known widespread osseous metastatic disease.  Electronically signed by: Morene Hoard MD 06/25/2024 12:26 AM EST RP Workstation: [id number]    I personally reviewed recent imaging.   Palliative Care Assessment and Plan Summary of Established Goals of Care and Medical Treatment Preferences   Patient is a 73 year old female with a past medical history of paroxysmal atrial fibrillation, aortic atherosclerosis, anxiety, depression, bipolar disorder, asthma, fatty liver disease, fibromyalgia, glucoma, gastric erosion, GERD, irritable bowel syndrome, sleep apnea not on CPAP, restless leg syndrome, tardive dyskinesia, vitamin B12 deficiency, and stage IV breast cancer with metastatic disease to bone, liver, and malignant pleural effusion who was admitted on 06/19/2024 for management of back pain, nausea, decreased oral intake, and overall failure to thrive.  Since being admitted, patient has continued receive management for poor oral intake/failure to thrive, hypothyroidism with concerns for myxedema coma, and liver dysfunction.  Palliative medicine team consulted to assist with complex medical decision making. Of note, patient follows with outpatient PMT at [facility].   # Complex medical decision making/goals of care  - Patient unable to participate in complex medical decision making due to under lying medical status.  - Discussed care with patient's husband at bedside as detailed above in HPI.  At this time continuing appropriate medical interventions.  Did express concerns related to patient's deterioration with multiple underlying medical illnesses.  Noted palliative medicine team would continue to follow along with patient's medical journey to engage in  conversations as able and appropriate moving forward.  -  Code  Status: Limited: Do not attempt resuscitation (DNR) -DNR-LIMITED -Do Not Intubate/DNI    # Psycho-social/Spiritual Support:  - Support System: Husband  # Discharge Planning:  To Be Determined  Thank you for allowing the palliative care team to participate in the care Doris Smith.  Tinnie Radar, DO Palliative Care Provider PMT # [phone]  If patient remains symptomatic despite maximum doses, please call PMT at [phone] between 0700 and 1900. Outside of these hours, please call attending, as PMT does not have night coverage.  Billing based on MDM: High  Problems Addressed: One or more chronic illnesses with severe exacerbation, progression, or side effects of treatment.  Amount and/or Complexity of Data: Category 1:Review of prior external note(s) from each unique source, Review of the result(s) of each unique test, and Assessment requiring an independent historian(s) and Category 2:Independent interpretation of a test performed by another physician/other qualified health care professional (not separately reported)       [1]  Allergies Allergen Reactions   Iohexol      over 20 years ago had reaction hurting in arm and heart-Done in [place], [place].SABRAphysician stopped CT at that time.   Codeine Other (See Comments)    Makes feel like she is wild    Palonosetron  Other (See Comments)    headache   Prednisone  Other (See Comments)    Makes me very irritable   Sulfonamide Derivatives Other (See Comments)    Upset stomach   Celecoxib Nausea And Vomiting   Sulfa Antibiotics Nausea And Vomiting   Vitamin B12 Rash

## 2024-06-25 NOTE — Progress Notes (Addendum)
 PROGRESS NOTE    Doris Smith  FMW:[id number] DOB: May 25, 1951 DOA: 06/19/2024 PCP: [name], FNP   Brief Narrative:   73 y.o. female with medical history significant of paroxysmal atrial fibrillation, aortic atherosclerosis, anxiety, depression, bipolar disorder, asthma, fatty liver disease, fibromyalgia, glaucoma, gastric erosion, GERD, irritable bowel syndrome, sleep apnea not on CPAP, restless leg syndrome, tardive dyskinesia, vitamin B12 deficiency, stage IV breast cancer who presented to the emergency department with back pain, nausea, decreased oral intake and overall failure to thrive.  Oncology repeating CT Scans and going to change Chemotherapy in the outpatient setting.  Patient was transferred to the stepdown unit with concern for possible myxedema coma given her elevated TSH of 183.0 so she was initiated on IV levothyroxine .  MRI brain didn't show any acute abnormalities. Mentation has improved after initiation of IV levothyroxine . IV vanc and zosyn  (day 2) transitioned to IV Ceftriaxone  on 12/17. Lives at home with her husband.   Assessment & Plan:  Principal Problem:   Failure to thrive in adult Active Problems:   Bipolar disorder (HCC)   Essential hypertension   Paroxysmal atrial fibrillation (HCC)   GERD (gastroesophageal reflux disease)   Aortic atherosclerosis   Malignant neoplasm of upper-outer quadrant of right breast in female, estrogen receptor positive (HCC)   Anxiety associated with depression   Restless leg syndrome   Chronic back pain   Constipation   Protein-calorie malnutrition, severe    Failure to Thrive in Adult: C/w Supportive care w/ Analgesics and Antiemetics as needed. Obtain PT/OT evaluation and PT recommending Home Health for now  - C/w Ensure High Plus; Add Marinol  2.5 mg po BID and will increased to 5 mg po BID - IV fluid has been stopped given her volume overload and received IV Albumin  and Furosemide  -Started on calorie  count today.    Hypothyroidism with concern for Myxedema Coma: TSH was 183.000.  She was started on IV levothyroxine  20 mcg daily. I spoke to the pharmacist and she will be given 80 mcg IV one time dose today followed by 50 mcg IV daily.  Chang to oral synthroid  likely in the next 24-48 hours. -Dronabinol  dced because of drowsiness.     Nausea and Vomiting: Initiate Antiemetics w/ Prochlorperazine  10 mg IV q6hprn N/V   Bipolar Disorder  / Anxiety associated with depression: Continue Olanzapine  5 mg p.o. bedtime and Continue Lorazepam  1 mg p.o. twice daily.   Essential Hypertension: Hypotensive currently. Received IV albumin  today, will order 250 cc NS bolus. Ordered midodrine  5 mg PO TID as well.   Paroxysmal Atrial Fibrillation : in sinus rhythm now   Aortic Atherosclerosis: Stabkle   Malignant Neoplasm of upper-outer quadrant of right breast in female, estrogen receptor positive with liver, bone involvement and malignant pleural effusion : Initially Continued Orserdu  345 mg p.o. daily but due to concern for progression the Oncology team is stopping this and since her last Bx was Her 2 2+  the Onc team is going to proceed with IV Enhertu q21 Days after Christmas (12/30) and arranging for Simultaneous Bx.  -Oncology consulted and since patient's tumor markers have doubled (CA 15-3 is 1,544.0 and CA 27.29 is 4,452.5) there is concern for disease progression so the oncology team is ordering a CT contrasted scans (CT Chest/Abd/Pelvis) with prophylaxis. This showed Status post right mastectomy. Innumerable hepatic metastases, grossly unchanged. Diffuse sclerotic osseous metastases, unchanged. Large bilateral pleural effusions, mildly progressive. Moderate abdominopelvic ascites, progressive. - Palliative care consulted for further goals of  care discussion   AKI on CKD Stage II: Baseline Cr ~0.6-0.9.  -Avoid Nephrotoxic Medications, Contrast Dyes, Hypotension and Dehydration to Ensure Adequate  Renal Perfusion and will need to Renally Adjust Meds. CTM & Trend Renal Function carefully & repeat CMP in the AM   Restless Leg Syndrome: On pramipexole  0.5 mg 3 times daily.   Hypokalemia: prn repletion  Liver dysfunction secondary to metastatic breast cancer: continue to monitor liver enzymes closely.   Chronic back pain: Continue OxyContin  15 mg p.o. twice daily and po Oxycodone  5 mg po q6hprn Breakthrough Pain however will need to be held given her somnolence and drowsiness   Constipation: Continue MiraLAX  17 grams every day as needed.   Pancytopenia: stable  Hypoalbuminemia: continue to monitor  GERD (gastroesophageal reflux disease)/GI Prophylaxis: Continue PPI w/ Pantoprazole  40 mg po BID and this will need to be changed to IV given her somnolence and drowsiness; continue Sucralfate  1 gram po TIDwm and HS.   Severe Malnutrition in the Context of Chronic illness: Nutrition Status: Nutrition Problem: Severe Malnutrition Etiology: chronic illness (stage IV breast cancer)  Disposition: Lives at home with her husband  DVT prophylaxis: Place and maintain sequential compression device Start: 06/23/24 1056     Code Status: Limited: Do not attempt resuscitation (DNR) -DNR-LIMITED -Do Not Intubate/DNI  Family Communication:  Husband is at the bedside Status is: Inpatient Remains inpatient appropriate because: AMS    Subjective:  She feels more awake and alert. She said that she was too drowsy yesterday. Husband is at the bedside. We spoke the treatment of hypothyroidism.  Examination:  General exam: Appears awake and alert Respiratory system: Clear to auscultation. Respiratory effort normal. Cardiovascular system: S1 & S2 heard, RRR. No JVD, murmurs, rubs, gallops or clicks. No pedal edema. Gastrointestinal system: Abdomen is nondistended, soft and nontender. No organomegaly or masses felt. Normal bowel sounds heard. Central nervous system: Alert and oriented. No focal  neurological deficits. Extremities: Symmetric 5 x 5 power. Skin: No rashes, lesions or ulcers  Wound 06/23/24 2130 Pressure Injury Coccyx Mid Deep Tissue Pressure Injury - Purple or maroon localized area of discolored intact skin or blood-filled blister due to damage of underlying soft tissue from pressure and/or shear. (Active)     Diet Orders (From admission, onward)     Start     Ordered   06/19/24 1219  Diet regular Fluid consistency: Thin  Diet effective now       Question:  Fluid consistency:  Answer:  Thin   06/19/24 1219            Objective: Vitals:   06/25/24 0700 06/25/24 0800 06/25/24 0900 06/25/24 1000  BP: (!) 99/52 (!) 112/43 (!) 89/39 (!) 100/51  Pulse: 69 69 77 81  Resp: 16 16 16 14   Temp:  97.9 F (36.6 C)    TempSrc:  Oral    SpO2: 98% 98% 97% 97%  Weight:      Height:        Intake/Output Summary (Last 24 hours) at 06/25/2024 1055 Last data filed at 06/25/2024 1038 Gross per 24 hour  Intake 146.01 ml  Output 750 ml  Net -603.99 ml   Filed Weights   06/19/24 1050 06/24/24 2010  Weight: 64.3 kg 68.3 kg    Scheduled Meds:  Chlorhexidine  Gluconate Cloth  6 each Topical Daily   dronabinol   5 mg Oral BID AC   feeding supplement  237 mL Oral TID BM   lactulose   20 g Oral  BID   levothyroxine   20 mcg Intravenous Daily   OLANZapine   5 mg Oral QHS   mouth rinse  15 mL Mouth Rinse 4 times per day   oxyCODONE   15 mg Oral Q12H   pantoprazole   40 mg Oral BID AC   potassium chloride   40 mEq Oral BID   pramipexole   0.5 mg Oral TID   sodium chloride  flush  10-40 mL Intracatheter Q12H   sucralfate   1 g Oral TID WC & HS   Continuous Infusions:  famotidine  (PEPCID ) IV     piperacillin -tazobactam (ZOSYN )  IV Stopped (06/25/24 1004)   [START ON 06/26/2024] vancomycin       Nutritional status Signs/Symptoms: moderate fat depletion, severe muscle depletion, percent weight loss (12.7% in 4 months) Percent weight loss: 12.7 % (in 4 months) Interventions:  Refer to RD note for recommendations, Ensure Enlive (each supplement provides 350kcal and 20 grams of protein) Body mass index is 25.85 kg/m.  Data Reviewed:   CBC: Recent Labs  Lab 06/20/24 1028 06/21/24 0147 06/22/24 0223 06/23/24 0241 06/23/24 1817 06/24/24 0415 06/25/24 0328  WBC 3.4* 3.7* 6.3 4.4  --  4.1 5.5  NEUTROABS 2.4 3.0 5.1 3.3  --  3.0  --   HGB 7.0* 7.2* 7.1* 6.7* 11.7* 10.4* 10.4*  HCT 21.9* 22.5* 22.2* 20.9* 35.0* 30.9* 31.2*  MCV 99.1 98.7 98.7 98.6  --  90.9 90.7  PLT 76* 70* 77* 64*  --  48* 43*   Basic Metabolic Panel: Recent Labs  Lab 06/21/24 0147 06/22/24 0223 06/23/24 0241 06/24/24 0415 06/25/24 0328  NA 136 135 138 138 139  K 4.3 4.0 3.3* 3.1* 4.3  CL 103 102 103 102 103  CO2 25 24 24 25 24   GLUCOSE 137* 143* 89 156* 89  BUN 14 15 19 22 22   CREATININE 1.00 1.16* 1.24* 1.27* 1.04*  CALCIUM 7.8* 7.8* 8.1* 8.0* 7.6*  MG 2.1 2.0 2.1 2.0 1.9  PHOS 3.4 3.2 3.0 3.0 3.4   GFR: Estimated Creatinine Clearance: 47.1 mL/min (A) (by C-G formula based on SCr of 1.04 mg/dL (H)). Liver Function Tests: Recent Labs  Lab 06/21/24 0147 06/22/24 0223 06/23/24 0241 06/24/24 0415 06/25/24 0328  AST 225* 226* 246* 374* 419*  ALT 35 35 37 47* 60*  ALKPHOS 418* 379* 360* 435* 482*  BILITOT 0.7 0.6 0.7 1.3* 1.8*  PROT 4.8* 4.9* 4.9* 5.2* 4.9*  ALBUMIN  2.4* 2.5* 2.8* 3.1* 2.7*   No results for input(s): LIPASE, AMYLASE in the last 168 hours. Recent Labs  Lab 06/24/24 1259  AMMONIA 70*   Coagulation Profile: No results for input(s): INR, PROTIME in the last 168 hours. Cardiac Enzymes: No results for input(s): CKTOTAL, CKMB, CKMBINDEX, TROPONINI in the last 168 hours. BNP (last 3 results) No results for input(s): PROBNP in the last 8760 hours. HbA1C: No results for input(s): HGBA1C in the last 72 hours. CBG: Recent Labs  Lab 06/23/24 2022  GLUCAP 105*   Lipid Profile: No results for input(s): CHOL, HDL, LDLCALC,  TRIG, CHOLHDL, LDLDIRECT in the last 72 hours. Thyroid  Function Tests: Recent Labs    06/24/24 1259  TSH 183.000*  FREET4 <0.25*   Anemia Panel: Recent Labs    06/24/24 1259  VITAMINB12 2,762*   Sepsis Labs: No results for input(s): PROCALCITON, LATICACIDVEN in the last 168 hours.  Recent Results (from the past 240 hours)  MRSA Next Gen by PCR, Nasal     Status: None   Collection Time: 06/24/24 10:19 PM  Specimen: Nasal Mucosa; Nasal Swab  Result Value Ref Range Status   MRSA by PCR Next Gen NOT DETECTED NOT DETECTED Final    Comment: (NOTE) The GeneXpert MRSA Assay (FDA approved for NASAL specimens only), is one component of a comprehensive MRSA colonization surveillance program. It is not intended to diagnose MRSA infection nor to guide or monitor treatment for MRSA infections. Test performance is not FDA approved in patients less than 80 years old. Performed at [facility], [address], [place], [place] [address]          Radiology Studies: MR BRAIN WO CONTRAST Result Date: 06/25/2024 EXAM: MRI BRAIN WITHOUT CONTRAST 06/24/2024 11:23:32 PM TECHNIQUE: Multiplanar multisequence MRI of the head/brain was performed without the administration of intravenous contrast. COMPARISON: Prior CT from 06/23/2024. CLINICAL HISTORY: Mental status change, unknown cause; Somnolence. FINDINGS: BRAIN AND VENTRICLES: No acute infarct. No intracranial hemorrhage. No mass. No midline shift. No hydrocephalus. Mild T2/FLAIR signal abnormality involving the periventricular white matter, most characteristic of chronic microvascular ischemic disease, mild for age. The sella is unremarkable. Normal flow voids. ORBITS: No acute abnormality. SINUSES AND MASTOIDS: Mild scattered mucosal thickening is present about the sphenoid with mild sinus involvement. No significant mastoid effusion. BONES AND SOFT TISSUES: Diffusely abnormal appearance of the visualized bone marrow,  consistent with known widespread osseous metastatic disease. No acute soft tissue abnormality. IMPRESSION: 1. No acute intracranial abnormality. 2. Diffusely abnormal appearance of the visualized bone marrow, consistent with known widespread osseous metastatic disease. Electronically signed by: Morene Hoard MD 06/25/2024 12:26 AM EST RP Workstation: [id number]   DG CHEST PORT 1 VIEW Result Date: 06/24/2024 EXAM: 1 VIEW(S) XRAY OF THE CHEST 06/24/2024 05:29:00 AM COMPARISON: 05/28/2024 CLINICAL HISTORY: SOB (shortness of breath) FINDINGS: LINES, TUBES AND DEVICES: Right chest port in place with tip projecting over right atrium. LUNGS AND PLEURA: Moderate bilateral pleural effusions. Worsening aeration to both lower lobes, which may reflect atelectasis and/or consolidative change. Bilateral interstitial opacities, likely pulmonary edema. No pneumothorax. HEART AND MEDIASTINUM: Stable cardiomegaly. BONES AND SOFT TISSUES: Diffuse sclerotic metastatic disease of osseous structures. Old healed left lower rib fracture. IMPRESSION: 1. Worsening aeration to both lower lobes, possibly reflecting atelectasis and/or consolidative change. 2. Bilateral interstitial opacities, likely pulmonary edema. 3. Moderate bilateral pleural effusions. Electronically signed by: Waddell Calk MD 06/24/2024 07:21 AM EST RP Workstation: [id number]   CT HEAD WO CONTRAST ( ) Result Date: 06/23/2024 EXAM: CT HEAD WITHOUT 06/23/2024 08:53:00 PM TECHNIQUE: CT of the head was performed without the administration of intravenous contrast. Automated exposure control, iterative reconstruction, and/or weight based adjustment of the mA/kV was utilized to reduce the radiation dose to as low as reasonably achievable. COMPARISON: 04/28/2024 CLINICAL HISTORY: Unwitnessed Fall FINDINGS: BRAIN AND VENTRICLES: No acute intracranial hemorrhage. No mass effect or midline shift. No extra-axial fluid collection. No evidence of acute infarct. No  hydrocephalus. Stable mild cerebral white matter hypodensities and mild cerebral atrophy compared to prior study. ORBITS: No acute abnormality. SINUSES AND MASTOIDS: No acute abnormality. SOFT TISSUES AND SKULL: No acute skull fracture. No acute soft tissue abnormality. Unchanged diffuse sclerosis of skull base bones. IMPRESSION: 1. No acute intracranial abnormality. 2. Mild chronic microvascular ischemic disease, stable. Electronically signed by: Morene Hoard MD 06/23/2024 09:09 PM EST RP Workstation: [id number]           LOS: 3 days   Time spent= 35 mins    Deliliah Room, MD [facility]  If 7PM-7AM, please contact night-coverage  06/25/2024, 10:55 AM

## 2024-06-25 NOTE — Progress Notes (Addendum)
° °      Overnight   NAME: Doris Smith MRN: [id number] DOB : 10-22-1950    Date of Service   06/25/2024   HPI/Events of Note    Follow up for Attending on Imaging   IMPRESSION: 1. No acute intracranial abnormality. 2. Diffusely abnormal appearance of the visualized bone marrow, consistent with known widespread osseous metastatic disease.   Electronically signed by: Morene Hoard MD 06/25/2024 12:26 AM EST RP Workstation: [id number]  Attending initiated treatment for potential Myxedema coma - ongoing with Pharmacy involvement.    Interventions/ Plan   Continue Attending orders and treatments       Lynwood Kipper BSN MSNA MSN ACNPC-AG Acute Care Nurse Practitioner [facility]

## 2024-06-25 NOTE — Procedures (Signed)
 Patient Name: Doris Smith  MRN: [id number]  Epilepsy Attending: Arlin MALVA Krebs  Referring Physician/Provider: Sherrill Alejandro Donovan, DO  Date: 06/25/2024 Duration: 22.41 mins  Patient history: 73yo F with ams. EEG to evaluate for seizure  Level of alertness: Awake  AEDs during EEG study: None  Technical aspects: This EEG study was done with scalp electrodes positioned according to the 10-20 International system of electrode placement. Electrical activity was reviewed with band pass filter of 1-70Hz , sensitivity of 7 uV/mm, display speed of 57mm/sec with a 60Hz  notched filter applied as appropriate. EEG data were recorded continuously and digitally stored.  Video monitoring was available and reviewed as appropriate.  Description: EEG showed continuous generalized 3 to 6 Hz theta-delta slowing. Hyperventilation and photic stimulation were not performed.     ABNORMALITY - Continuous slow, generalized  IMPRESSION: This study is suggestive of moderate diffuse encephalopathy. No seizures or epileptiform discharges were seen throughout the recording.  Doris Smith

## 2024-06-26 DIAGNOSIS — Z7189 Other specified counseling: Secondary | ICD-10-CM

## 2024-06-26 DIAGNOSIS — Z79899 Other long term (current) drug therapy: Secondary | ICD-10-CM

## 2024-06-26 DIAGNOSIS — Z711 Person with feared health complaint in whom no diagnosis is made: Secondary | ICD-10-CM

## 2024-06-26 DIAGNOSIS — C7951 Secondary malignant neoplasm of bone: Secondary | ICD-10-CM

## 2024-06-26 DIAGNOSIS — R627 Adult failure to thrive: Secondary | ICD-10-CM | POA: Diagnosis not present

## 2024-06-26 DIAGNOSIS — G893 Neoplasm related pain (acute) (chronic): Secondary | ICD-10-CM

## 2024-06-26 DIAGNOSIS — C787 Secondary malignant neoplasm of liver and intrahepatic bile duct: Secondary | ICD-10-CM

## 2024-06-26 LAB — COMPREHENSIVE METABOLIC PANEL WITH GFR
ALT: 57 U/L — ABNORMAL HIGH (ref 0–44)
AST: 337 U/L — ABNORMAL HIGH (ref 15–41)
Albumin: 2.7 g/dL — ABNORMAL LOW (ref 3.5–5.0)
Alkaline Phosphatase: 498 U/L — ABNORMAL HIGH (ref 38–126)
Anion gap: 12 (ref 5–15)
BUN: 23 mg/dL (ref 8–23)
CO2: 23 mmol/L (ref 22–32)
Calcium: 7.8 mg/dL — ABNORMAL LOW (ref 8.9–10.3)
Chloride: 104 mmol/L (ref 98–111)
Creatinine, Ser: 1.05 mg/dL — ABNORMAL HIGH (ref 0.44–1.00)
GFR, Estimated: 57 mL/min — ABNORMAL LOW (ref >60)
Glucose, Bld: 123 mg/dL — ABNORMAL HIGH (ref 70–99)
Potassium: 3.7 mmol/L (ref 3.5–5.1)
Sodium: 139 mmol/L (ref 135–145)
Total Bilirubin: 1.7 mg/dL — ABNORMAL HIGH (ref 0.0–1.2)
Total Protein: 5.1 g/dL — ABNORMAL LOW (ref 6.5–8.1)

## 2024-06-26 LAB — CBC WITH DIFFERENTIAL/PLATELET
Abs Immature Granulocytes: 0.08 K/uL — ABNORMAL HIGH (ref 0.00–0.07)
Basophils Absolute: 0 K/uL (ref 0.0–0.1)
Basophils Relative: 1 %
Eosinophils Absolute: 0 K/uL (ref 0.0–0.5)
Eosinophils Relative: 1 %
HCT: 31.5 % — ABNORMAL LOW (ref 36.0–46.0)
Hemoglobin: 10.6 g/dL — ABNORMAL LOW (ref 12.0–15.0)
Immature Granulocytes: 1 %
Lymphocytes Relative: 7 %
Lymphs Abs: 0.4 K/uL — ABNORMAL LOW (ref 0.7–4.0)
MCH: 31 pg (ref 26.0–34.0)
MCHC: 33.7 g/dL (ref 30.0–36.0)
MCV: 92.1 fL (ref 80.0–100.0)
Monocytes Absolute: 1.2 K/uL — ABNORMAL HIGH (ref 0.1–1.0)
Monocytes Relative: 20 %
Neutro Abs: 4.3 K/uL (ref 1.7–7.7)
Neutrophils Relative %: 70 %
Platelets: 46 K/uL — ABNORMAL LOW (ref 150–400)
RBC: 3.42 MIL/uL — ABNORMAL LOW (ref 3.87–5.11)
RDW: 19 % — ABNORMAL HIGH (ref 11.5–15.5)
WBC: 6 K/uL (ref 4.0–10.5)
nRBC: 0.3 % — ABNORMAL HIGH (ref 0.0–0.2)

## 2024-06-26 LAB — URINE CULTURE

## 2024-06-26 MED ORDER — GLYCOPYRROLATE 0.2 MG/ML IJ SOLN: 0.2000 mg | INTRAMUSCULAR | Status: DC | PRN

## 2024-06-26 MED ORDER — BIOTENE DRY MOUTH MT LIQD: 15.0000 mL | OROMUCOSAL | Status: DC | PRN

## 2024-06-26 MED ORDER — HYDROMORPHONE HCL 1 MG/ML IJ SOLN: 0.5000 mg | INTRAMUSCULAR | Status: DC | PRN

## 2024-06-26 MED ORDER — LORAZEPAM 2 MG/ML IJ SOLN: 0.5000 mg | Freq: Four times a day (QID) | INTRAMUSCULAR | Status: DC | PRN

## 2024-06-26 MED ORDER — POLYVINYL ALCOHOL 1.4 % OP SOLN: 1.0000 [drp] | Freq: Four times a day (QID) | OPHTHALMIC | Status: DC | PRN

## 2024-06-26 MED ORDER — HALOPERIDOL LACTATE 5 MG/ML IJ SOLN: 0.5000 mg | INTRAMUSCULAR | Status: DC | PRN

## 2024-06-26 MED ORDER — PROCHLORPERAZINE EDISYLATE 10 MG/2ML IJ SOLN: 10.0000 mg | Freq: Four times a day (QID) | INTRAMUSCULAR | Status: DC | PRN

## 2024-06-26 NOTE — Progress Notes (Signed)
 Daily Progress Note   Patient Name: Doris Smith       Date: 06/26/2024 DOB: May 15, 1951  Age: 73 y.o. MRN#: [id number] Attending Physician: Dino Antu, MD Primary Care Physician: [name], FNP Admit Date: 06/19/2024 Length of Stay: 4 days  Reason for Consultation/Follow-up: Establishing goals of care  Subjective:   Reviewed EMR including recent documentation from hospitalist.  Reviewed recent CMP noting albumin  low at 2.7 and total bilirubin still elevated at 1.7.  Estimated GFR 57.  Also reviewed CBC noting patient's platelets remain low at 43. Discussed care with bedside RN for updates.  Family planning to come to bedside today to discuss goals for medical care moving forward.  ------------------------------------------------------------------------------------------------------------- Advance Care Planning Conversation  Pertinent diagnosis: bipolar disorder, asthma, fatty liver disease, stage IV breast cancer with metastatic disease to bone, liver, and malignant pleural effusions, elevated LFTs, AKI, thrombocytopenia  The patient and family consented to a voluntary Advance Care Planning Conversation in person. Individuals present for the conversation: Patient unable to participate in complex medical decision making due to underlying medical status.  Discussed care with patient's family including her husband, son, and sister.  Summary of the conversation:  Presented to bedside when multiple family members present including patient's husband and sister.  Hospitalist also present discussing care planning with family.  Again introduced myself as a member of the palliative medicine team.  Reviewed that patient's medical status is overall continuing to worsen.  Patient is sleeping more and has minimal oral intake.  Patient appears to be reaching the end of her life.  Able to discuss transitioning to comfort focused care at this time and what this would and would not  entail.  Husband and family supporting transition to full comfort focused care now.  Also discussed referral for hospice care.  Husband and family note that they cannot bring the patient home due to their current home status.  Did discuss inpatient hospice referral and evaluation.  Family specifically requesting referral for inpatient hospice in [facility] through hosp[facility] of the [place].  Acknowledged this and noted would reach out to [facility] and liaison regarding this.  Spent time providing emotional support via active listening.  All questions answered at that time.  Patient's husband asked this provider to update son regarding care once he returned to bedside.  Able to update son regarding discussion and transition to comfort focused care and hospice evaluation.  Spent time answering questions and providing emotional support.  Outcome of the conversations and/or documents completed:  Transition patient to full comfort focused care.  Discussed inpatient hospice referral and evaluation.  Also discussed if patient not stable for transfer, anticipate in-hospital death.  I spent 50 minutes providing separately identifiable ACP services with the patient and/or surrogate decision maker in a voluntary, in-person conversation discussing the patient's wishes and goals as detailed in the above note.  Tinnie Radar, DO Palliative Medicine Provider  -------------------------------------------------------------------------------------------------------------  Discussed care with hospitalist, [facility] the [facility] liaison, bedside RN, oncologist, and TOC during the day to coordinate care planning.  Objective:   Vital Signs:  BP (!) 102/46   Pulse 81   Temp 98 F (36.7 C) (Oral)   Resp 17   Ht 5' 4 (1.626 m)   Wt 68.3 kg   SpO2 99%   BMI 25.85 kg/m   Physical Exam: General: NAD, lethargic, ill-appearing, frail Cardiovascular: RRR Respiratory: no increased work of breathing noted, not in respiratory  distress Abdomen: not distended Neuro: Lethargic  Assessment &  Plan:   Assessment: Patient is a 73 year old female with a past medical history of paroxysmal atrial fibrillation, aortic atherosclerosis, anxiety, depression, bipolar disorder, asthma, fatty liver disease, fibromyalgia, glaucoma, gastric erosion, GERD, irritable bowel syndrome, sleep apnea not on CPAP, restless leg syndrome, tardive dyskinesia, vitamin B12 deficiency, and stage IV breast cancer with metastatic disease to bone, liver, and malignant pleural effusion who was admitted on 06/19/2024 for management of back pain, nausea, decreased oral intake, and overall failure to thrive.  Since being admitted, patient has continued receive management for poor oral intake/failure to thrive, hypothyroidism with concerns for myxedema coma, and liver dysfunction.  Palliative medicine team consulted to assist with complex medical decision making. Of note, patient follows with outpatient PMT at [facility].  Recommendations/Plan: # Complex medical decision making/goals of care  -Patient unable to participate in medical decision making secondary to mental status.  -Spoke with patient's family at bedside including patient's husband, son, and sister.  Discussed despite appropriate medical interventions, patient's medical status has continued to deteriorate.  Discussed pathways for care moving forward.  Explained transition to comfort focused care and what this would and would not entail.  All family supporting transition to comfort focused care at end-of-life.  Family requesting referral for inpatient hospice evaluation in [facility] through hosp[facility] of the [place].  [facility] liaison and [facility] assisting with coordination of care.  Palliative medicine team continuing to follow along with patient's medical journey.  -At this time we will discontinue interventions that are no longer focused on comfort such as IV fluids, imaging, or lab work.  Will  instead focus on symptom management of pain, dyspnea, and agitation in the setting of end-of-life care.    Code Status: Do not attempt resuscitation (DNR) - Comfort care  # Symptom management Patient is receiving these palliative interventions for symptom management with an intent to improve quality of life.     -Pain/Dyspnea, acute in the setting of end-of-life care                               -Start IV Dilaudid  0.5-2 mg every 30 minute as needed.  Continue to adjust based on patient's symptom burden.                  -Anxiety/agitation, in the setting of end-of-life care                               -Start IV Ativan  0.5 mg every 6 hours as needed. Continue to adjust based on patient's symptom burden.                                 -Start IV Haldol 0.5 mg every 4 hours as needed. Continue to adjust based on patient's symptom burden.                   -Secretions, in the setting of end-of-life care                               -Start IV glycopyrrolate  0.2 mg every 4 hours as needed.  # Discharge Planning: Anticipated Hospital Death versus transfer to inpatient hospice in [facility] if accepted.  Discussed with: Patient's family, hospitalist, TOC, hospice of  the [employer] [facility], oncologist, RN  Thank you for allowing the palliative care team to participate in the care Montie Lesches Faires.  Tinnie Radar, DO Palliative Care Provider PMT # [phone]  If patient remains symptomatic despite maximum doses, please call PMT at [phone] between 0700 and 1900. Outside of these hours, please call attending, as PMT does not have night coverage.  Billing based on MDM: High  Problems Addressed: One or more chronic illnesses with severe exacerbation, progression, or side effects of treatment.  Amount and/or Complexity of Data: Category 1:Review of prior external note(s) from each unique source, Review of the result(s) of each unique test, and Assessment requiring an independent historian(s)  and Category 3:Discussion of management or test interpretation with external physician/other qualified health care professional/appropriate source (not separately reported)  Risks: Parenteral controlled substances

## 2024-06-26 NOTE — Progress Notes (Signed)
 Report called to Sidra, CHARITY FUNDRAISER at [facility]. Will await [facility] arrival for transport.

## 2024-06-26 NOTE — Progress Notes (Signed)
 Nutrition Brief Note  Chart reviewed. Pt now transitioning to comfort care.  No further nutrition interventions planned at this time.   Shelle Iron RD, LDN Contact via [employer].

## 2024-06-26 NOTE — Progress Notes (Signed)
 I provided grief support to Doris Smith's family: her husband, sister, brother-in-law, son and daughter-in-law as they surrounded her with love.  I provided prayer, at their request, as well as facilitated sharing memories.  They are grateful that she is comfortable and peaceful right now.    We will continue to follow up, but please page as needs arise.   [address] Bcc Pager, [phone]

## 2024-06-26 NOTE — TOC Progression Note (Signed)
 Transition of Care [facility]) - Progression Note    Patient Details  Name: Doris Smith MRN: [id number] Date of Birth: [birth year]  [facility] CM/SW Contact  Jon ONEIDA Anon, RN Phone Number: 06/26/2024, 11:24 AM  Clinical Narrative:    Pt is transitioning to comfort care. ICM consulted for residential hospice in [facility]. Referral sent to Magdalena Berber, hospital liaison with [facility]. Magdalena has accepted referral and working on admission to facility in [place]. ICM following for DC planning needs.      Expected Discharge Plan: Hospice Medical Facility Barriers to Discharge: Continued Medical Work up               Expected Discharge Plan and Services In-house Referral: NA Discharge Planning Services: CM Consult Post Acute Care Choice: NA Living arrangements for the past 2 months: Single Family Home                 DME Arranged: N/A DME Agency: NA       HH Arranged: NA HH Agency: [facility] Date HH Agency Contacted: 06/26/24 Time HH Agency Contacted: 1030 Representative spoke with at [facility] Agency: Magdalena Berber   Social Drivers of Health (SDOH) Interventions SDOH Screenings   Food Insecurity: No Food Insecurity (06/19/2024)  Housing: Low Risk (06/19/2024)  Transportation Needs: No Transportation Needs (06/19/2024)  Utilities: Not At Risk (06/19/2024)  Alcohol  Screen: Low Risk (08/18/2022)  Depression (PHQ2-9): High Risk (05/16/2024)  Financial Resource Strain: Patient Declined (10/22/2023)  Physical Activity: Unknown (10/22/2023)  Social Connections: Moderately Isolated (06/19/2024)  Stress: Stress Concern Present (10/22/2023)  Tobacco Use: Low Risk (06/19/2024)    Readmission Risk Interventions     No data to display

## 2024-06-26 NOTE — Plan of Care (Signed)
  Problem: Clinical Measurements: Goal: Will remain free from infection Outcome: Progressing   Problem: Safety: Goal: Ability to remain free from injury will improve Outcome: Progressing   

## 2024-06-26 NOTE — Discharge Summary (Addendum)
 Physician Discharge Summary   Patient: Doris Smith MRN: [id number] DOB: 01-08-1951  Admit date:     06/19/2024  Discharge date: 06/26/2024  Discharge Physician: Deliliah Room   PCP: [name], FNP   Recommendations at discharge:    Hospice care  Discharge Diagnoses: Principal Problem:   Failure to thrive in adult Active Problems:   Bipolar disorder [facility])   Essential hypertension   Paroxysmal atrial fibrillation (HCC)   GERD (gastroesophageal reflux disease)   Aortic atherosclerosis   Malignant neoplasm of upper-outer quadrant of right breast in female, estrogen receptor positive (HCC)   Anxiety associated with depression   Restless leg syndrome   Chronic back pain   Constipation   Protein-calorie malnutrition, severe   Palliative care encounter   DNR (do not resuscitate)   Need for emotional support   Elevated LFTs   Metastasis from breast cancer (HCC)   Goals of care, counseling/discussion   Malignant neoplasm metastatic to bone [facility])   Malignant neoplasm metastatic to liver [facility])   Concern about end of life   High risk medication use   Cancer associated pain   Medication management   ACP (advance care planning)    Hospital Course:  73 y.o. female with medical history significant of paroxysmal atrial fibrillation, aortic atherosclerosis, anxiety, depression, bipolar disorder, asthma, fatty liver disease, fibromyalgia, glaucoma, gastric erosion, GERD, irritable bowel syndrome, sleep apnea not on CPAP, restless leg syndrome, tardive dyskinesia, vitamin B12 deficiency, stage IV breast cancer who presented to the emergency department with back pain, nausea, decreased oral intake and overall failure to thrive.  Oncology repeating CT Scans and going to change Chemotherapy in the outpatient setting.  Patient was transferred to the stepdown unit with concern for possible myxedema coma given her elevated TSH of 183.0 so she was initiated on IV levothyroxine .   MRI brain didn't show any acute abnormalities.     Transitioned to comfort/hospice on 12/18.  Symptom management as per hospice.  Acute metabolic encephalopathy: -in the setting of liver dysfunction secondary to metastatic breast cancer -No evidence of acute stroke on MRI brain -Other contributing factor is severe hypothyroidism. -EEG showed moderate diffuse encephalopathy -Transitioned to hospice now     Failure to Thrive in Adult:  Transitioned to hospice now     Hypothyroidism with concern for Myxedema Coma: TSH was 183.000.  Hospice now   Bipolar Disorder  / Anxiety associated with depression: On comfort care now   Essential Hypertension: on comfort care   Paroxysmal Atrial Fibrillation : in sinus rhythm now   Aortic Atherosclerosis: No acute issues   Malignant Neoplasm of upper-outer quadrant of right breast in female, estrogen receptor positive with liver, bone involvement and malignant pleural effusion :  Transitioned to comfort/hospice now   AKI on CKD Stage II: Baseline Cr ~0.6-0.9.    Restless Leg Syndrome: on comfort care now     Liver dysfunction secondary to metastatic breast cancer:   Chronic back pain: prn analgesics   Pancytopenia: stable   Hypoalbuminemia: in  the setting of metastatic breast cancer     Severe Malnutrition in the Context of Chronic illness: Nutrition Status: Nutrition Problem: Severe Malnutrition Etiology: chronic illness (stage IV breast cancer)   Disposition: Hospice facility in [facility].       Consultants: Palliative Procedures performed: None  Disposition: Hospice care Diet recommendation:  Regular diet DISCHARGE MEDICATION: Allergies as of 06/26/2024       Reactions   Iohexol     over 20  years ago had reaction hurting in arm and heart-Done in [place], [place].SABRAphysician stopped CT at that time.   Codeine Other (See Comments)   Makes feel like she is wild   Palonosetron  Other (See Comments)   headache    Prednisone  Other (See Comments)   Makes me very irritable   Sulfonamide Derivatives Other (See Comments)   Upset stomach   Celecoxib Nausea And Vomiting   Sulfa Antibiotics Nausea And Vomiting   Vitamin B12 Rash        Medication List     STOP taking these medications    AMBULATORY NON FORMULARY MEDICATION   Certainty Fitted Briefs XL Misc   cyanocobalamin  1000 MCG tablet   dicyclomine -lidocaine -alum & mag hydroxide-simeth   elacestrant  hydrochloride 345 MG tablet Commonly known as: ORSERDU    famotidine  40 MG tablet Commonly known as: PEPCID    lidocaine  5 % Commonly known as: LIDODERM    lidocaine -prilocaine  cream Commonly known as: EMLA    LORazepam  1 MG tablet Commonly known as: ATIVAN  Replaced by: LORazepam  2 MG/ML injection   magic mouthwash (nystatin , diphenhydrAMINE , alum & mag hydroxide) suspension mixture   OLANZapine  5 MG tablet Commonly known as: ZYPREXA    ondansetron  8 MG tablet Commonly known as: ZOFRAN    oxyCODONE  15 mg 12 hr tablet Commonly known as: OXYCONTIN    oxyCODONE -acetaminophen  10-325 MG tablet Commonly known as: Percocet   pantoprazole  40 MG tablet Commonly known as: PROTONIX    polyethylene glycol 17 g packet Commonly known as: MIRALAX  / GLYCOLAX    pramipexole  1 MG tablet Commonly known as: MIRAPEX    prochlorperazine  10 MG tablet Commonly known as: COMPAZINE    sucralfate  1 GM/10ML suspension Commonly known as: CARAFATE        TAKE these medications    antiseptic oral rinse Liqd Apply 15 mLs topically as needed for dry mouth.   artificial tears ophthalmic solution Place 1 drop into both eyes 4 (four) times daily as needed for dry eyes.   glycopyrrolate  0.2 MG/ML injection Commonly known as: ROBINUL  Inject 1 mL (0.2 mg total) into the vein every 4 (four) hours as needed (excessive secretions).   HYDROmorphone  1 MG/ML injection Commonly known as: DILAUDID  Inject 0.5-2 mLs (0.5-2 mg total) into the vein every 30  (thirty) minutes as needed for severe pain (pain score 7-10) (To alleviate signs and symptoms of distress).   LORazepam  2 MG/ML injection Commonly known as: ATIVAN  Inject 0.25 mLs (0.5 mg total) into the vein every 6 (six) hours as needed for anxiety. Replaces: LORazepam  1 MG tablet   prochlorperazine  10 MG/2ML injection Commonly known as: COMPAZINE  Inject 2 mLs (10 mg total) into the vein every 6 (six) hours as needed.        Follow-up Information     [name], FNP. Schedule an appointment as soon as possible for a visit in 1 week(s).   Specialty: Family Medicine Contact information: [address] [place] [address] [phone]                Discharge Exam: Fredricka Weights   06/19/24 1050 06/24/24 2010  Weight: 64.3 kg 68.3 kg   General exam: Obtunded Respiratory system: Clear to auscultation. Respiratory effort normal. Cardiovascular system: S1 & S2 heard, RRR. No JVD, murmurs, rubs, gallops or clicks. No pedal edema. Gastrointestinal system: Abdomen is nondistended, soft and nontender. No organomegaly or masses felt. Normal bowel sounds heard. Central nervous system: Obtunded Extremities: no acute findings Skin: No rashes, lesions or ulcers  Condition at discharge: serious  The results of  significant diagnostics from this hospitalization (including imaging, microbiology, ancillary and laboratory) are listed below for reference.   Imaging Studies: EEG adult Result Date: 06/25/2024 Shelton Arlin KIDD, MD     06/25/2024  5:33 PM Patient Name: Genette Huertas MRN: [id number] Epilepsy Attending: Arlin KIDD Shelton Referring Physician/Provider: Sherrill Alejandro Donovan, DO Date: 06/25/2024 Duration: 22.41 mins Patient history: 73yo F with ams. EEG to evaluate for seizure Level of alertness: Awake AEDs during EEG study: None Technical aspects: This EEG study was done with scalp electrodes positioned according to the 10-20 International system of  electrode placement. Electrical activity was reviewed with band pass filter of 1-70Hz , sensitivity of 7 uV/mm, display speed of 53mm/sec with a 60Hz  notched filter applied as appropriate. EEG data were recorded continuously and digitally stored.  Video monitoring was available and reviewed as appropriate. Description: EEG showed continuous generalized 3 to 6 Hz theta-delta slowing. Hyperventilation and photic stimulation were not performed.   ABNORMALITY - Continuous slow, generalized IMPRESSION: This study is suggestive of moderate diffuse encephalopathy. No seizures or epileptiform discharges were seen throughout the recording. Arlin KIDD Shelton   MR BRAIN WO CONTRAST Result Date: 06/25/2024 EXAM: MRI BRAIN WITHOUT CONTRAST 06/24/2024 11:23:32 PM TECHNIQUE: Multiplanar multisequence MRI of the head/brain was performed without the administration of intravenous contrast. COMPARISON: Prior CT from 06/23/2024. CLINICAL HISTORY: Mental status change, unknown cause; Somnolence. FINDINGS: BRAIN AND VENTRICLES: No acute infarct. No intracranial hemorrhage. No mass. No midline shift. No hydrocephalus. Mild T2/FLAIR signal abnormality involving the periventricular white matter, most characteristic of chronic microvascular ischemic disease, mild for age. The sella is unremarkable. Normal flow voids. ORBITS: No acute abnormality. SINUSES AND MASTOIDS: Mild scattered mucosal thickening is present about the sphenoid with mild sinus involvement. No significant mastoid effusion. BONES AND SOFT TISSUES: Diffusely abnormal appearance of the visualized bone marrow, consistent with known widespread osseous metastatic disease. No acute soft tissue abnormality. IMPRESSION: 1. No acute intracranial abnormality. 2. Diffusely abnormal appearance of the visualized bone marrow, consistent with known widespread osseous metastatic disease. Electronically signed by: Morene Hoard MD 06/25/2024 12:26 AM EST RP Workstation: [id number]    DG CHEST PORT 1 VIEW Result Date: 06/24/2024 EXAM: 1 VIEW(S) XRAY OF THE CHEST 06/24/2024 05:29:00 AM COMPARISON: 05/28/2024 CLINICAL HISTORY: SOB (shortness of breath) FINDINGS: LINES, TUBES AND DEVICES: Right chest port in place with tip projecting over right atrium. LUNGS AND PLEURA: Moderate bilateral pleural effusions. Worsening aeration to both lower lobes, which may reflect atelectasis and/or consolidative change. Bilateral interstitial opacities, likely pulmonary edema. No pneumothorax. HEART AND MEDIASTINUM: Stable cardiomegaly. BONES AND SOFT TISSUES: Diffuse sclerotic metastatic disease of osseous structures. Old healed left lower rib fracture. IMPRESSION: 1. Worsening aeration to both lower lobes, possibly reflecting atelectasis and/or consolidative change. 2. Bilateral interstitial opacities, likely pulmonary edema. 3. Moderate bilateral pleural effusions. Electronically signed by: Waddell Calk MD 06/24/2024 07:21 AM EST RP Workstation: [id number]   CT HEAD WO CONTRAST ( ) Result Date: 06/23/2024 EXAM: CT HEAD WITHOUT 06/23/2024 08:53:00 PM TECHNIQUE: CT of the head was performed without the administration of intravenous contrast. Automated exposure control, iterative reconstruction, and/or weight based adjustment of the mA/kV was utilized to reduce the radiation dose to as low as reasonably achievable. COMPARISON: 04/28/2024 CLINICAL HISTORY: Unwitnessed Fall FINDINGS: BRAIN AND VENTRICLES: No acute intracranial hemorrhage. No mass effect or midline shift. No extra-axial fluid collection. No evidence of acute infarct. No hydrocephalus. Stable mild cerebral white matter hypodensities and mild cerebral atrophy compared to prior study. ORBITS: No acute abnormality.  SINUSES AND MASTOIDS: No acute abnormality. SOFT TISSUES AND SKULL: No acute skull fracture. No acute soft tissue abnormality. Unchanged diffuse sclerosis of skull base bones. IMPRESSION: 1. No acute intracranial abnormality. 2.  Mild chronic microvascular ischemic disease, stable. Electronically signed by: Morene Hoard MD 06/23/2024 09:09 PM EST RP Workstation: [id number]   CT CHEST ABDOMEN PELVIS W CONTRAST Result Date: 06/21/2024 EXAM: CT CHEST, ABDOMEN AND PELVIS WITH CONTRAST 06/21/2024 10:01:56 AM TECHNIQUE: CT of the chest, abdomen and pelvis was performed with the administration of 100 mL of iohexol  (OMNIPAQUE ) 300 MG/ML solution. Multiplanar reformatted images are provided for review. Automated exposure control, iterative reconstruction, and/or weight based adjustment of the mA/kV was utilized to reduce the radiation dose to as low as reasonably achievable. COMPARISON: CT abdomen/pelvis dated 05/17/2024 and PET CT dated 04/03/2024. CLINICAL HISTORY: Breast cancer, invasive, stage IV, assess treatment response; metastatic breast cancer. FINDINGS: CHEST: MEDIASTINUM AND LYMPH NODES: Heart and pericardium are unremarkable. Right chest port terminates in the upper right atrium. The central airways are clear. No mediastinal, hilar or axillary lymphadenopathy. LUNGS AND PLEURA: Large bilateral pleural effusions, chronic but mildly progressive. Associated compressive atelectasis in the bilateral lower lobes. Stable nodular/plate-like opacity in the posterior left upper lobe (image 42), non FDG avid on prior PET, likely reflecting nodular scarring. No focal consolidation or pulmonary edema. No pneumothorax. ABDOMEN AND PELVIS: LIVER: Innumerable hepatic metastases, measuring up to 5.1 cm in the posterior right hepatic lobe (image 123), grossly unchanged when remeasured in a similar fashion. GALLBLADDER AND BILE DUCTS: Gallbladder is unremarkable. No biliary ductal dilatation. SPLEEN: No acute abnormality. PANCREAS: No acute abnormality. ADRENAL GLANDS: No acute abnormality. KIDNEYS, URETERS AND BLADDER: 11 mm nonobstructing left lower pole renal calculus (image 66), unchanged. Stable moderate left hydronephrosis, favoring UPJ  stenosis. No perinephric or periureteral stranding. The bladder is mildly thick-walled, although underdistended. GI AND BOWEL: Large hiatal hernia/inverted intrathoracic stomach. Mild rectal wall thickening, equivocal. Stomach demonstrates no acute abnormality. There is no bowel obstruction. REPRODUCTIVE ORGANS: No acute abnormality. PERITONEUM AND RETROPERITONEUM: Moderate abdominopelvic ascites, progressive. No free air. VASCULATURE: Aorta is normal in caliber. ABDOMINAL AND PELVIS LYMPH NODES: No lymphadenopathy. BONES AND SOFT TISSUES: Body wall edema. Status post right mastectomy. Diffuse sclerotic osseous metastases, unchanged. Status post ORIF of the right femoral neck. IMPRESSION: 1. Status post right mastectomy. 2. Innumerable hepatic metastases, grossly unchanged. 3. Diffuse sclerotic osseous metastases, unchanged. 4. Large bilateral pleural effusions, mildly progressive. 5. Moderate abdominopelvic ascites, progressive. 6. Additional stable ancillary findings, as above. Electronically signed by: Pinkie Pebbles MD 06/21/2024 05:45 PM EST RP Workstation: [id number]   IR THORACENTESIS ASP PLEURAL SPACE W/IMG GUIDE Result Date: 05/28/2024 INDICATION: 73 year old female with a history of metastatic breast cancer common dyspnea, recurrent pleural effusions. Request for therapeutic thoracentesis. EXAM: ULTRASOUND GUIDED THERAPEUTIC, RIGHT-SIDED THORACENTESIS MEDICATIONS: 1% lidocaine , 6 mL. COMPLICATIONS: None immediate. PROCEDURE: An ultrasound guided thoracentesis was thoroughly discussed with the patient and questions answered. The benefits, risks, alternatives and complications were also discussed. The patient understands and wishes to proceed with the procedure. Written consent was obtained. Ultrasound was performed to localize and mark an adequate pocket of fluid in the right chest. The area was then prepped and draped in the normal sterile fashion. 1% Lidocaine  was used for local anesthesia. Under  ultrasound guidance a 6 Fr Safe-T-Centesis catheter was introduced. Thoracentesis was performed. The catheter was removed and a dressing applied. FINDINGS: A total of approximately 600 mL of serous fluid was removed. IMPRESSION: Successful ultrasound guided right-sided thoracentesis  yielding 600 mL of pleural fluid. Procedure performed by: Sherrilee Bal, PA-C under the supervision of Dr. KANDICE Banner Electronically Signed   By: [name] Banner   On: 05/28/2024 10:57   DG Chest 1 View Result Date: 05/28/2024 CLINICAL DATA:  Status post right-sided thoracentesis with 600 mL fluid EXAM: CHEST  1 VIEW COMPARISON:  One view chest 05/06/2024 FINDINGS: No pneumothorax. Redemonstration of right IJ chest port in stable location. Atherosclerotic disease. Residual left-sided pleural effusion. Left lung base atelectasis again seen. Hiatal hernia. Metastatic changes in the axial skeleton. IMPRESSION: No pneumothorax. Decreased right pleural effusion. Persistent left pleural effusion. Electronically Signed   By: [name] Banner   On: 05/28/2024 10:48    Microbiology: Results for orders placed or performed during the hospital encounter of 06/19/24  Culture, blood (Routine X 2) w Reflex to ID Panel     Status: None (Preliminary result)   Collection Time: 06/24/24  7:34 PM   Specimen: BLOOD  Result Value Ref Range Status   Specimen Description   Final    BLOOD BLOOD LEFT HAND Performed at [facility], [address], [place], [place] [address]    Special Requests   Final    BOTTLES DRAWN AEROBIC ONLY Blood Culture results may not be optimal due to an inadequate volume of blood received in culture bottles Performed at [facility], [address] [place], [place] [address]    Culture   Final    NO GROWTH 1 DAY Performed at [facility], [address], [place], [place] [address]    Report Status PENDING  Incomplete  Culture, blood (Routine X 2) w Reflex to ID  Panel     Status: None (Preliminary result)   Collection Time: 06/24/24  7:40 PM   Specimen: BLOOD  Result Value Ref Range Status   Specimen Description   Final    BLOOD BLOOD LEFT HAND Performed at [facility], [address] [place], [place] [address]    Special Requests   Final    BOTTLES DRAWN AEROBIC ONLY Blood Culture results may not be optimal due to an inadequate volume of blood received in culture bottles Performed at [facility], [address], [place], [place] [address]    Culture   Final    NO GROWTH 1 DAY Performed at [facility], [address] [place], [place] [address]    Report Status PENDING  Incomplete  Urine Culture (for pregnant, neutropenic or urologic patients or patients with an indwelling urinary catheter)     Status: Abnormal   Collection Time: 06/24/24 10:12 PM   Specimen: Urine, Clean Catch  Result Value Ref Range Status   Specimen Description   Final    URINE, CLEAN CATCH Performed at [facility], [address], [place], [place] [address]    Special Requests   Final    NONE Performed at [facility], [address], [place], [place] [address]    Culture MULTIPLE SPECIES PRESENT, SUGGEST RECOLLECTION (A)  Final   Report Status 06/26/2024 FINAL  Final  MRSA Next Gen by PCR, Nasal     Status: None   Collection Time: 06/24/24 10:19 PM   Specimen: Nasal Mucosa; Nasal Swab  Result Value Ref Range Status   MRSA by PCR Next Gen NOT DETECTED NOT DETECTED Final    Comment: (NOTE) The GeneXpert MRSA Assay (FDA approved for NASAL specimens only), is one  component of a comprehensive MRSA colonization surveillance program. It is not intended to diagnose MRSA infection nor to guide or monitor treatment for MRSA infections. Test performance is not FDA approved in patients less than 2 years old. Performed at [facility], [address] [place], [place] [address]    *Note: Due to a large number of results and/or encounters for the requested time period, some results have not been displayed. A complete set of results can be found in Results Review.    Labs: CBC: Recent Labs  Lab 06/21/24 0147 06/22/24 0223 06/23/24 0241 06/23/24 1817 06/24/24 0415 06/25/24 0328 06/26/24 0417  WBC 3.7* 6.3 4.4  --  4.1 5.5 6.0  NEUTROABS 3.0 5.1 3.3  --  3.0  --  4.3  HGB 7.2* 7.1* 6.7* 11.7* 10.4* 10.4* 10.6*  HCT 22.5* 22.2* 20.9* 35.0* 30.9* 31.2* 31.5*  MCV 98.7 98.7 98.6  --  90.9 90.7 92.1  PLT 70* 77* 64*  --  48* 43* 46*   Basic Metabolic Panel: Recent Labs  Lab 06/21/24 0147 06/22/24 0223 06/23/24 0241 06/24/24 0415 06/25/24 0328 06/26/24 0417  NA 136 135 138 138 139 139  K 4.3 4.0 3.3* 3.1* 4.3 3.7  CL 103 102 103 102 103 104  CO2 25 24 24 25 24 23   GLUCOSE 137* 143* 89 156* 89 123*  BUN 14 15 19 22 22 23   CREATININE 1.00 1.16* 1.24* 1.27* 1.04* 1.05*  CALCIUM 7.8* 7.8* 8.1* 8.0* 7.6* 7.8*  MG 2.1 2.0 2.1 2.0 1.9  --   PHOS 3.4 3.2 3.0 3.0 3.4  --    Liver Function Tests: Recent Labs  Lab 06/22/24 0223 06/23/24 0241 06/24/24 0415 06/25/24 0328 06/26/24 0417  AST 226* 246* 374* 419* 337*  ALT 35 37 47* 60* 57*  ALKPHOS 379* 360* 435* 482* 498*  BILITOT 0.6 0.7 1.3* 1.8* 1.7*  PROT 4.9* 4.9* 5.2* 4.9* 5.1*  ALBUMIN  2.5* 2.8* 3.1* 2.7* 2.7*   CBG: Recent Labs  Lab 06/23/24 2022 06/25/24 1529  GLUCAP 105* 124*    Discharge time spent: 40 minutes.  Signed: Deliliah Room, MD [facility] 06/26/2024

## 2024-06-26 NOTE — Plan of Care (Signed)
  Problem: Education: Goal: Knowledge of General Education information will improve Description: Including pain rating scale, medication(s)/side effects and non-pharmacologic comfort measures Outcome: Completed/Met   Problem: Health Behavior/Discharge Planning: Goal: Ability to manage health-related needs will improve Outcome: Completed/Met   Problem: Clinical Measurements: Goal: Ability to maintain clinical measurements within normal limits will improve Outcome: Completed/Met Goal: Will remain free from infection Outcome: Completed/Met Goal: Diagnostic test results will improve Outcome: Completed/Met Goal: Respiratory complications will improve Outcome: Completed/Met Goal: Cardiovascular complication will be avoided Outcome: Completed/Met   Problem: Activity: Goal: Risk for activity intolerance will decrease Outcome: Completed/Met   Problem: Nutrition: Goal: Adequate nutrition will be maintained Outcome: Completed/Met   Problem: Coping: Goal: Level of anxiety will decrease Outcome: Completed/Met   Problem: Elimination: Goal: Will not experience complications related to bowel motility Outcome: Completed/Met Goal: Will not experience complications related to urinary retention Outcome: Completed/Met   Problem: Pain Managment: Goal: General experience of comfort will improve and/or be controlled Outcome: Completed/Met   Problem: Safety: Goal: Ability to remain free from injury will improve Outcome: Completed/Met   Problem: Skin Integrity: Goal: Risk for impaired skin integrity will decrease Outcome: Completed/Met   Problem: Education: Goal: Knowledge of the prescribed therapeutic regimen will improve Outcome: Completed/Met   Problem: Coping: Goal: Ability to identify and develop effective coping behavior will improve Outcome: Completed/Met   Problem: Clinical Measurements: Goal: Quality of life will improve Outcome: Completed/Met   Problem:  Respiratory: Goal: Verbalizations of increased ease of respirations will increase Outcome: Completed/Met   Problem: Role Relationship: Goal: Family's ability to cope with current situation will improve Outcome: Completed/Met Goal: Ability to verbalize concerns, feelings, and thoughts to partner or family member will improve Outcome: Completed/Met   Problem: Pain Management: Goal: Satisfaction with pain management regimen will improve Outcome: Completed/Met

## 2024-06-26 NOTE — TOC Transition Note (Signed)
 Transition of Care [facility]) - Discharge Note   Patient Details  Name: Doris Smith MRN: [id number] Date of Birth: Oct 26, 1950  Transition of Care [facility]) CM/SW Contact:  Jon ONEIDA Anon, RN Phone Number: 06/26/2024, 3:58 PM   Clinical Narrative:    Pt will discharge to inpatient hospice at [facility] in [facility]. Pt family in agreement with discharge plan. DC packet placed at RN station with signed DNR inside. PTAR called at 1545. No further ICM needs at this time. Will sign off.   Final next level of care: Hospice Medical Facility Barriers to Discharge: Barriers Resolved   Patient Goals and CMS Choice Patient states their goals for this hospitalization and ongoing recovery are:: To go to inpatient hospice facility in [facility] with [facility] CMS Medicare.gov Compare Post Acute Care list provided to::  (NA) Choice offered to / list presented to : NA [place] ownership interest in [facility].provided to:: Parent NA    Discharge Placement              Patient chooses bed at: Other - please specify in the comment section below: ([facility]) Patient to be transferred to facility by: PTAR Name of family member notified: Justus,[name]  Spouse, [phone] Patient and family notified of of transfer: 06/26/24  Discharge Plan and Services Additional resources added to the After Visit Summary for   In-house Referral: NA Discharge Planning Services: CM Consult Post Acute Care Choice: NA          DME Arranged: N/A DME Agency: NA       HH Arranged: NA HH Agency: [facility] Date HH Agency Contacted: 06/26/24 Time HH Agency Contacted: 1030 Representative spoke with at [facility] Agency: Magdalena Berber  Social Drivers of Health (SDOH) Interventions SDOH Screenings   Food Insecurity: No Food Insecurity (06/19/2024)  Housing: Low Risk (06/19/2024)  Transportation Needs: No Transportation Needs (06/19/2024)  Utilities: Not At Risk  (06/19/2024)  Alcohol  Screen: Low Risk (08/18/2022)  Depression (PHQ2-9): High Risk (05/16/2024)  Financial Resource Strain: Patient Declined (10/22/2023)  Physical Activity: Unknown (10/22/2023)  Social Connections: Moderately Isolated (06/19/2024)  Stress: Stress Concern Present (10/22/2023)  Tobacco Use: Low Risk (06/19/2024)     Readmission Risk Interventions     No data to display

## 2024-06-26 NOTE — Progress Notes (Signed)
 PROGRESS NOTE    Doris Smith  FMW:[id number] DOB: 11/21/1950 DOA: 06/19/2024 PCP: [name], FNP   Brief Narrative:   73 y.o. female with medical history significant of paroxysmal atrial fibrillation, aortic atherosclerosis, anxiety, depression, bipolar disorder, asthma, fatty liver disease, fibromyalgia, glaucoma, gastric erosion, GERD, irritable bowel syndrome, sleep apnea not on CPAP, restless leg syndrome, tardive dyskinesia, vitamin B12 deficiency, stage IV breast cancer who presented to the emergency department with back pain, nausea, decreased oral intake and overall failure to thrive.  Oncology repeating CT Scans and going to change Chemotherapy in the outpatient setting.  Patient was transferred to the stepdown unit with concern for possible myxedema coma given her elevated TSH of 183.0 so she was initiated on IV levothyroxine .  MRI brain didn't show any acute abnormalities.   Transitioned to comfort/hospice on 12/18.   Assessment & Plan:  Principal Problem:   Failure to thrive in adult Active Problems:   Bipolar disorder (HCC)   Essential hypertension   Paroxysmal atrial fibrillation (HCC)   GERD (gastroesophageal reflux disease)   Aortic atherosclerosis   Malignant neoplasm of upper-outer quadrant of right breast in female, estrogen receptor positive (HCC)   Anxiety associated with depression   Restless leg syndrome   Chronic back pain   Constipation   Protein-calorie malnutrition, severe   Palliative care encounter   DNR (do not resuscitate)   Need for emotional support   Elevated LFTs   Metastasis from breast cancer (HCC)   Goals of care, counseling/discussion   Malignant neoplasm metastatic to bone [facility])   Malignant neoplasm metastatic to liver [facility])   Concern about end of life   High risk medication use   Cancer associated pain   Medication management   ACP (advance care planning)   Acute metabolic encephalopathy: -in the setting of  liver dysfunction secondary to metastatic breast cancer -No evidence of acute stroke on MRI brain -Other contributing factor is severe hypothyroidism. 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  On IV levothyroxine      Bipolar Disorder  / Anxiety associated with depression: On comfort care now   Essential Hypertension: on comfort care   Paroxysmal Atrial Fibrillation : in sinus rhythm now   Aortic Atherosclerosis: No acute issues   Malignant Neoplasm of upper-outer quadrant of right breast in female, estrogen receptor positive with liver, bone involvement and malignant pleural effusion :  Transitioned to comfort/hospice now   AKI on CKD Stage II: Baseline Cr ~0.6-0.9.   Restless Leg Syndrome: on comfort care now   Liver dysfunction secondary to metastatic breast cancer:   Chronic back pain: prn analgesics   Pancytopenia: stable  Hypoalbuminemia: in  the setting of metastatic breast cancer    Severe Malnutrition in the Context of Chronic illness: Nutrition Status: Nutrition Problem: Severe Malnutrition Etiology: chronic illness (stage IV breast cancer)  Disposition: Hospice facility in [facility].  DVT prophylaxis: Place and maintain sequential compression device Start: 06/23/24 1056     Code Status: Do not attempt resuscitation (DNR) - Comfort care Family Communication:  Husband and other family members are at the bedside Status is: Inpatient Remains inpatient appropriate because: AMS    Subjective:  She is obtunded. Husband and other family members at the bedside. Dr Clayton was at the bedside as well and after extensive discussion, family decided to pursue comfort  care/hospice and wants hospice facility at [facility].  Examination:  General exam: Obtunded Respiratory system: Clear to auscultation.  Respiratory effort normal. Cardiovascular system: S1 & S2 heard, RRR. No JVD, murmurs, rubs, gallops or clicks. No pedal edema. Gastrointestinal system: Abdomen is nondistended, soft and nontender. No organomegaly or masses felt. Normal bowel sounds heard. Central nervous system: Obtunded Extremities: no acute findings Skin: No rashes, lesions or ulcers  Wound 06/23/24 2130 Pressure Injury Coccyx Mid Deep Tissue Pressure Injury - Purple or maroon localized area of discolored intact skin or blood-filled blister due to damage of underlying soft tissue from pressure and/or shear. (Active)     Diet Orders (From admission, onward)     Start     Ordered   06/19/24 1219  Diet regular Fluid consistency: Thin  Diet effective now       Question:  Fluid consistency:  Answer:  Thin   06/19/24 1219            Objective: Vitals:   06/26/24 0800 06/26/24 0900 06/26/24 1000 06/26/24 1100  BP: (!) 97/44 (!) 111/40 (!) 101/37 (!) 95/37  Pulse: 74 83 81 80  Resp:      Temp:  97.9 F (36.6 C)    TempSrc:  Axillary    SpO2: 98% 100% 100% 100%  Weight:      Height:        Intake/Output Summary (Last 24 hours) at 06/26/2024 1401 Last data filed at 06/26/2024 0452 Gross per 24 hour  Intake 360.28 ml  Output 50 ml  Net 310.28 ml   Filed Weights   06/19/24 1050 06/24/24 2010  Weight: 64.3 kg 68.3 kg    Scheduled Meds:  feeding supplement  237 mL Oral TID BM   levothyroxine   50 mcg Intravenous Daily   mouth rinse  15 mL Mouth Rinse 4 times per day   sodium chloride  flush  10-40 mL Intracatheter Q12H   Continuous Infusions:    Nutritional status Signs/Symptoms: moderate fat depletion, severe muscle depletion, percent weight loss (12.7% in 4 months) Percent weight loss: 12.7 % (in 4 months) Interventions: Refer to RD note for recommendations, Ensure Enlive (each supplement provides 350kcal and 20 grams of protein) Body mass index is 25.85 kg/m.  Data Reviewed:   CBC: Recent  Labs  Lab 06/21/24 0147 06/22/24 0223 06/23/24 0241 06/23/24 1817 06/24/24 0415 06/25/24 0328 06/26/24 0417  WBC 3.7* 6.3 4.4  --  4.1 5.5 6.0  NEUTROABS 3.0 5.1 3.3  --  3.0  --  4.3  HGB 7.2* 7.1* 6.7* 11.7* 10.4* 10.4* 10.6*  HCT 22.5* 22.2* 20.9* 35.0* 30.9* 31.2* 31.5*  MCV 98.7 98.7 98.6  --  90.9 90.7 92.1  PLT 70* 77* 64*  --  48* 43* 46*   Basic Metabolic Panel: Recent Labs  Lab 06/21/24 0147 06/22/24 0223 06/23/24 0241 06/24/24 0415 06/25/24 0328 06/26/24 0417  NA 136 135 138 138 139 139  K 4.3 4.0 3.3* 3.1* 4.3 3.7  CL 103 102 103 102 103 104  CO2 25 24 24 25 24 23   GLUCOSE 137* 143* 89 156* 89 123*  BUN 14 15 19 22 22 23   CREATININE 1.00 1.16* 1.24* 1.27* 1.04* 1.05*  CALCIUM 7.8* 7.8* 8.1* 8.0* 7.6* 7.8*  MG 2.1 2.0 2.1 2.0 1.9  --   PHOS 3.4 3.2 3.0 3.0 3.4  --    GFR: Estimated Creatinine Clearance: 46.6 mL/min (A) (by C-G formula based on SCr of 1.05 mg/dL (H)). Liver  Function Tests: Recent Labs  Lab 06/22/24 0223 06/23/24 0241 06/24/24 0415 06/25/24 0328 06/26/24 0417  AST 226* 246* 374* 419* 337*  ALT 35 37 47* 60* 57*  ALKPHOS 379* 360* 435* 482* 498*  BILITOT 0.6 0.7 1.3* 1.8* 1.7*  PROT 4.9* 4.9* 5.2* 4.9* 5.1*  ALBUMIN  2.5* 2.8* 3.1* 2.7* 2.7*   No results for input(s): LIPASE, AMYLASE in the last 168 hours. Recent Labs  Lab 06/24/24 1259  AMMONIA 70*   Coagulation Profile: No results for input(s): INR, PROTIME in the last 168 hours. Cardiac Enzymes: No results for input(s): CKTOTAL, CKMB, CKMBINDEX, TROPONINI in the last 168 hours. BNP (last 3 results) No results for input(s): PROBNP in the last 8760 hours. HbA1C: No results for input(s): HGBA1C in the last 72 hours. CBG: Recent Labs  Lab 06/23/24 2022 06/25/24 1529  GLUCAP 105* 124*   Lipid Profile: No results for input(s): CHOL, HDL, LDLCALC, TRIG, CHOLHDL, LDLDIRECT in the last 72 hours. Thyroid  Function Tests: Recent Labs     06/24/24 1259  TSH 183.000*  FREET4 <0.25*   Anemia Panel: Recent Labs    06/24/24 1259  VITAMINB12 2,762*   Sepsis Labs: No results for input(s): PROCALCITON, LATICACIDVEN in the last 168 hours.  Recent Results (from the past 240 hours)  Culture, blood (Routine X 2) w Reflex to ID Panel     Status: None (Preliminary result)   Collection Time: 06/24/24  7:34 PM   Specimen: BLOOD  Result Value Ref Range Status   Specimen Description   Final    BLOOD BLOOD LEFT HAND Performed at [facility], [address], [place], [place] [address]    Special Requests   Final    BOTTLES DRAWN AEROBIC ONLY Blood Culture results may not be optimal due to an inadequate volume of blood received in culture bottles Performed at [facility], [address], [place], [place] [address]    Culture   Final    NO GROWTH 1 DAY Performed at [facility], [address], [place], [place] [address]    Report Status PENDING  Incomplete  Culture, blood (Routine X 2) w Reflex to ID Panel     Status: None (Preliminary result)   Collection Time: 06/24/24  7:40 PM   Specimen: BLOOD  Result Value Ref Range Status   Specimen Description   Final    BLOOD BLOOD LEFT HAND Performed at [facility], [address] [place], [place] [address]    Special Requests   Final    BOTTLES DRAWN AEROBIC ONLY Blood Culture results may not be optimal due to an inadequate volume of blood received in culture bottles Performed at [facility], [address], [place], [place] [address]    Culture   Final    NO GROWTH 1 DAY Performed at [facility], [address], [place], [place] [address]    Report Status PENDING  Incomplete  Urine Culture (for pregnant, neutropenic or urologic patients or patients with an indwelling urinary catheter)     Status: Abnormal   Collection Time: 06/24/24 10:12 PM   Specimen: Urine, Clean Catch   Result Value Ref Range Status   Specimen Description   Final    URINE, CLEAN CATCH Performed at [facility], [address], [place], [place] [address]    Special Requests   Final    NONE Performed at [facility], [address] [place], [place] [address]    Culture MULTIPLE SPECIES PRESENT, SUGGEST RECOLLECTION (A)  Final   Report Status 06/26/2024 FINAL  Final  MRSA Next Gen by PCR, Nasal     Status: None   Collection Time: 06/24/24 10:19 PM   Specimen: Nasal Mucosa; Nasal Swab  Result Value Ref Range Status   MRSA by PCR Next Gen NOT DETECTED NOT DETECTED Final    Comment: (NOTE) The GeneXpert MRSA Assay (FDA approved for NASAL specimens only), is one component of a comprehensive MRSA colonization surveillance program. It is not intended to diagnose MRSA infection nor to guide or monitor treatment for MRSA infections. Test performance is not FDA approved in patients less than 38 years old. Performed at [facility], [address], [place], [place] [address]          Radiology Studies: EEG adult Result Date: 06/25/2024 Shelton Arlin KIDD, MD     06/25/2024  5:33 PM Patient Name: Doris Smith MRN: [id number] Epilepsy Attending: Arlin KIDD Shelton Referring Physician/Provider: Sherrill Alejandro Donovan, DO Date: 06/25/2024 Duration: 22.41 mins Patient history: 73yo F with ams. EEG to evaluate for seizure Level of alertness: Awake AEDs during EEG study: None Technical aspects: This EEG study was done with scalp electrodes positioned according to the 10-20 International system of electrode placement. Electrical activity was reviewed with band pass filter of 1-70Hz , sensitivity of 7 uV/mm, display speed of 32mm/sec with a 60Hz  notched filter applied as appropriate. EEG data were recorded continuously and digitally stored.  Video monitoring was available and reviewed as appropriate. Description: EEG showed continuous generalized  3 to 6 Hz theta-delta slowing. Hyperventilation and photic stimulation were not performed.   ABNORMALITY - Continuous slow, generalized IMPRESSION: This study is suggestive of moderate diffuse encephalopathy. No seizures or epileptiform discharges were seen throughout the recording. Arlin KIDD Shelton   MR BRAIN WO CONTRAST Result Date: 06/25/2024 EXAM: MRI BRAIN WITHOUT CONTRAST 06/24/2024 11:23:32 PM TECHNIQUE: Multiplanar multisequence MRI of the head/brain was performed without the administration of intravenous contrast. COMPARISON: Prior CT from 06/23/2024. CLINICAL HISTORY: Mental status change, unknown cause; Somnolence. FINDINGS: BRAIN AND VENTRICLES: No acute infarct. No intracranial hemorrhage. No mass. No midline shift. No hydrocephalus. Mild T2/FLAIR signal abnormality involving the periventricular white matter, most characteristic of chronic microvascular ischemic disease, mild for age. The sella is unremarkable. Normal flow voids. ORBITS: No acute abnormality. SINUSES AND MASTOIDS: Mild scattered mucosal thickening is present about the sphenoid with mild sinus involvement. No significant mastoid effusion. BONES AND SOFT TISSUES: Diffusely abnormal appearance of the visualized bone marrow, consistent with known widespread osseous metastatic disease. No acute soft tissue abnormality. IMPRESSION: 1. No acute intracranial abnormality. 2. Diffusely abnormal appearance of the visualized bone marrow, consistent with known widespread osseous metastatic disease. Electronically signed by: Morene Hoard MD 06/25/2024 12:26 AM EST RP Workstation: [id number]           LOS: 4 days   Time spent= 35 mins    Deliliah Room, MD [facility]  If 7PM-7AM, please contact night-coverage  06/26/2024, 2:01 PM

## 2024-06-26 NOTE — Progress Notes (Signed)
 Pt trasnfered with all belongings to 1621, bed plugged into hillrom service and bed alarm armed. Family in room and receiving RN at bedside.  06/26/2024 1:32 PM Oneil LITTIE Norse, RN

## 2024-06-27 ENCOUNTER — Inpatient Hospital Stay

## 2024-06-27 ENCOUNTER — Telehealth: Payer: Self-pay | Admitting: Hematology and Oncology

## 2024-06-27 ENCOUNTER — Inpatient Hospital Stay: Admitting: Hematology and Oncology

## 2024-06-27 LAB — TYPE AND SCREEN
ABO/RH(D): O POS
Antibody Screen: NEGATIVE
Unit division: 0
Unit division: 0
Unit division: 0

## 2024-06-27 LAB — BPAM RBC
Blood Product Expiration Date: 202601102359
Blood Product Expiration Date: 202601102359
Blood Product Expiration Date: 202601102359
ISSUE DATE / TIME: 202512150901
ISSUE DATE / TIME: 202512151218
Unit Type and Rh: 5100
Unit Type and Rh: 5100
Unit Type and Rh: 5100

## 2024-06-27 NOTE — Telephone Encounter (Signed)
 I left voicemail for patient regarding a message received stating patient no longer wanted to proceed with treatments at [facility]. I advised patient to return my call to receive clarity.

## 2024-06-30 LAB — CULTURE, BLOOD (ROUTINE X 2)
Culture: NO GROWTH
Culture: NO GROWTH

## 2024-07-01 ENCOUNTER — Encounter: Payer: Self-pay | Admitting: Hematology and Oncology

## 2024-07-07 ENCOUNTER — Inpatient Hospital Stay

## 2024-07-07 ENCOUNTER — Inpatient Hospital Stay: Admitting: Hematology and Oncology

## 2024-07-10 DEATH — deceased

## 2024-07-16 ENCOUNTER — Encounter: Payer: Self-pay | Admitting: Hematology and Oncology

## 2024-07-17 LAB — GUARDANT 360

## 2024-08-04 ENCOUNTER — Ambulatory Visit: Admitting: Physician Assistant

## 2024-08-05 ENCOUNTER — Ambulatory Visit: Admitting: Physician Assistant
